# Patient Record
Sex: Male | Born: 1955 | Race: Black or African American | Hispanic: No | Marital: Single | Smoking: Current some day smoker
Health system: Southern US, Community
[De-identification: ages and names within clinical notes are randomized; demographics above are authoritative.]

## PROBLEM LIST (undated history)

## (undated) DIAGNOSIS — G709 Myoneural disorder, unspecified: Secondary | ICD-10-CM

## (undated) DIAGNOSIS — J45909 Unspecified asthma, uncomplicated: Secondary | ICD-10-CM

## (undated) DIAGNOSIS — Z973 Presence of spectacles and contact lenses: Secondary | ICD-10-CM

## (undated) DIAGNOSIS — Z8709 Personal history of other diseases of the respiratory system: Secondary | ICD-10-CM

## (undated) DIAGNOSIS — M109 Gout, unspecified: Secondary | ICD-10-CM

## (undated) DIAGNOSIS — D573 Sickle-cell trait: Secondary | ICD-10-CM

## (undated) DIAGNOSIS — I639 Cerebral infarction, unspecified: Secondary | ICD-10-CM

## (undated) DIAGNOSIS — I10 Essential (primary) hypertension: Secondary | ICD-10-CM

## (undated) DIAGNOSIS — H469 Unspecified optic neuritis: Secondary | ICD-10-CM

## (undated) DIAGNOSIS — K759 Inflammatory liver disease, unspecified: Secondary | ICD-10-CM

## (undated) DIAGNOSIS — I2510 Atherosclerotic heart disease of native coronary artery without angina pectoris: Secondary | ICD-10-CM

## (undated) DIAGNOSIS — I739 Peripheral vascular disease, unspecified: Secondary | ICD-10-CM

## (undated) DIAGNOSIS — T7840XA Allergy, unspecified, initial encounter: Secondary | ICD-10-CM

## (undated) DIAGNOSIS — H269 Unspecified cataract: Secondary | ICD-10-CM

## (undated) DIAGNOSIS — K219 Gastro-esophageal reflux disease without esophagitis: Secondary | ICD-10-CM

## (undated) DIAGNOSIS — Z972 Presence of dental prosthetic device (complete) (partial): Secondary | ICD-10-CM

## (undated) DIAGNOSIS — R569 Unspecified convulsions: Secondary | ICD-10-CM

## (undated) DIAGNOSIS — R0600 Dyspnea, unspecified: Secondary | ICD-10-CM

## (undated) DIAGNOSIS — C649 Malignant neoplasm of unspecified kidney, except renal pelvis: Secondary | ICD-10-CM

## (undated) DIAGNOSIS — F1010 Alcohol abuse, uncomplicated: Secondary | ICD-10-CM

## (undated) DIAGNOSIS — I509 Heart failure, unspecified: Secondary | ICD-10-CM

## (undated) DIAGNOSIS — Z9289 Personal history of other medical treatment: Secondary | ICD-10-CM

## (undated) DIAGNOSIS — I864 Gastric varices: Secondary | ICD-10-CM

## (undated) DIAGNOSIS — G8929 Other chronic pain: Secondary | ICD-10-CM

## (undated) DIAGNOSIS — N189 Chronic kidney disease, unspecified: Secondary | ICD-10-CM

## (undated) DIAGNOSIS — K7460 Unspecified cirrhosis of liver: Secondary | ICD-10-CM

## (undated) DIAGNOSIS — M1990 Unspecified osteoarthritis, unspecified site: Secondary | ICD-10-CM

## (undated) DIAGNOSIS — R188 Other ascites: Secondary | ICD-10-CM

## (undated) DIAGNOSIS — M549 Dorsalgia, unspecified: Secondary | ICD-10-CM

## (undated) DIAGNOSIS — F1910 Other psychoactive substance abuse, uncomplicated: Secondary | ICD-10-CM

## (undated) HISTORY — PX: OTHER SURGICAL HISTORY: SHX169

## (undated) HISTORY — DX: Unspecified optic neuritis: H46.9

## (undated) HISTORY — DX: Sickle-cell trait: D57.3

## (undated) HISTORY — DX: Essential (primary) hypertension: I10

## (undated) HISTORY — DX: Myoneural disorder, unspecified: G70.9

## (undated) HISTORY — DX: Unspecified cirrhosis of liver: K74.60

## (undated) HISTORY — DX: Allergy, unspecified, initial encounter: T78.40XA

## (undated) HISTORY — DX: Unspecified cataract: H26.9

## (undated) HISTORY — DX: Unspecified osteoarthritis, unspecified site: M19.90

## (undated) HISTORY — DX: Other psychoactive substance abuse, uncomplicated: F19.10

## (undated) HISTORY — PX: UPPER GASTROINTESTINAL ENDOSCOPY: SHX188

---

## 1996-04-29 DIAGNOSIS — I639 Cerebral infarction, unspecified: Secondary | ICD-10-CM

## 1996-04-29 HISTORY — DX: Cerebral infarction, unspecified: I63.9

## 1999-04-30 DIAGNOSIS — I509 Heart failure, unspecified: Secondary | ICD-10-CM

## 1999-04-30 HISTORY — DX: Heart failure, unspecified: I50.9

## 2012-12-16 ENCOUNTER — Emergency Department (HOSPITAL_COMMUNITY)
Admission: EM | Admit: 2012-12-16 | Discharge: 2012-12-16 | Disposition: A | Discharge status: Home / Self Care | Payer: Self-pay | Attending: Emergency Medicine | Admitting: Emergency Medicine

## 2012-12-16 ENCOUNTER — Encounter (HOSPITAL_COMMUNITY): Payer: Self-pay | Admitting: *Deleted

## 2012-12-16 DIAGNOSIS — Z880 Allergy status to penicillin: Secondary | ICD-10-CM | POA: Insufficient documentation

## 2012-12-16 DIAGNOSIS — M542 Cervicalgia: Secondary | ICD-10-CM | POA: Insufficient documentation

## 2012-12-16 DIAGNOSIS — F17200 Nicotine dependence, unspecified, uncomplicated: Secondary | ICD-10-CM | POA: Insufficient documentation

## 2012-12-16 DIAGNOSIS — M546 Pain in thoracic spine: Secondary | ICD-10-CM | POA: Insufficient documentation

## 2012-12-16 DIAGNOSIS — M549 Dorsalgia, unspecified: Secondary | ICD-10-CM

## 2012-12-16 DIAGNOSIS — M2550 Pain in unspecified joint: Secondary | ICD-10-CM | POA: Insufficient documentation

## 2012-12-16 DIAGNOSIS — I10 Essential (primary) hypertension: Secondary | ICD-10-CM | POA: Insufficient documentation

## 2012-12-16 HISTORY — DX: Essential (primary) hypertension: I10

## 2012-12-16 MED ORDER — HYDROMORPHONE HCL PF 1 MG/ML IJ SOLN
1.0000 mg | Freq: Once | INTRAMUSCULAR | Status: AC
  Administered 2012-12-16: 1 mg via INTRAMUSCULAR
  Filled 2012-12-16: qty 1

## 2012-12-16 MED ORDER — OXYCODONE-ACETAMINOPHEN 5-325 MG PO TABS: 1.0000 | ORAL_TABLET | ORAL | Status: DC | PRN

## 2012-12-16 NOTE — ED Notes (Signed)
Pt requesting to see Child psychotherapist. States is new in town and needs resources. SW paged.

## 2012-12-16 NOTE — ED Provider Notes (Signed)
CSN: [id number]     Arrival date & time 12/16/12  1221 History     First MD Initiated Contact with Patient 12/16/12 1231     Chief Complaint  Patient presents with  . Back Pain   (Consider location/radiation/quality/duration/timing/severity/associated sxs/prior Treatment) The history is provided by the patient and medical records.   Patient presents to the ED for her neck and upper back pain for the past several weeks, worse over the past 2-3 days. No recent injury, trauma, or falls.  Patient states approximately 15 years ago he fell 20 feet at a land[place] landing on a concrete slab. Patient states no vertebral fractures at that time, but he does have some residual, intermittent pain. Denies any numbness or paresthesias of extremities. No loss of bowel or bladder function.  Has been taking OTC meds without relief.  Patient recently moved here from [place], and is not currently have a PCP.  No chest pain, shortness of breath, or abdominal pain. No urinary symptoms.  Past Medical History  Diagnosis Date  . Hypertension    History reviewed. No pertinent past surgical history. History reviewed. No pertinent family history. History  Substance Use Topics  . Smoking status: Current Every Day Smoker    Types: Cigarettes  . Smokeless tobacco: Not on file  . Alcohol Use: Yes     Comment: occ    Review of Systems  Musculoskeletal: Positive for back pain and arthralgias.  All other systems reviewed and are negative.    Allergies  Penicillins  Home Medications  No current outpatient prescriptions on file. BP 155/98  Pulse 66  Temp(Src) 98.3 F (36.8 C) (Oral)  Resp 18  SpO2 96%  Physical Exam  Nursing note and vitals reviewed. Constitutional: He is oriented to person, place, and time. He appears well-developed and well-nourished. No distress.  HENT:  Head: Normocephalic and atraumatic.  Mouth/Throat: Oropharynx is clear and moist.  Eyes: Conjunctivae and EOM are normal. Pupils  are equal, round, and reactive to light.  Neck: Normal range of motion. Neck supple. No rigidity.  No meningeal signs  Cardiovascular: Normal rate, regular rhythm and normal heart sounds.   Pulmonary/Chest: Effort normal and breath sounds normal. No respiratory distress. He has no wheezes.  Musculoskeletal: He exhibits no edema.       Cervical back: He exhibits decreased range of motion, tenderness, bony tenderness and pain. He exhibits no swelling, no edema, no deformity, no laceration, no spasm and normal pulse.       Thoracic back: He exhibits decreased range of motion, tenderness, bony tenderness and pain. He exhibits no swelling, no edema, no deformity, no laceration, no spasm and normal pulse.  Diffuse TTP of CS and TS without step off, deformity, or visible signs of trauma; decreased ROM due to pain; strong pulses in all 4 extremities; distal sensation intact  Neurological: He is alert and oriented to person, place, and time.  Skin: Skin is warm and dry. He is not diaphoretic.  Psychiatric: He has a normal mood and affect.    ED Course   Procedures (including critical care time)  Labs Reviewed - No data to display No results found.  1. Back pain   2. Neck pain     MDM   Atraumatic, acute on chronic neck and back pain.  No concern for aortic dissection or cauda equina.  IM dilaudid given with good improvement.  Rx percocet.  [facility].  Discussed plan with pt, he agreed.  Return precautions advised.  Pt ambulated out of the ED without difficulty.  Garlon Hatchet, PA-C 12/16/12 1346  Garlon Hatchet, PA-C 12/16/12 1346

## 2012-12-16 NOTE — ED Notes (Signed)
Reports neck and upper back pain x 2-3 days. Hx of injury in past but denies new injury. No acute distress noted at triage.

## 2012-12-16 NOTE — ED Notes (Signed)
Child psychotherapist in.

## 2012-12-16 NOTE — ED Notes (Signed)
C/O neck and upper back pain x 2-3 days. No known injury.

## 2012-12-16 NOTE — ED Provider Notes (Signed)
Medical screening examination/treatment/procedure(s) were performed by non-physician practitioner and as supervising physician I was immediately available for consultation/collaboration.   Patrina Andreas, MD 12/16/12 1356 

## 2012-12-29 ENCOUNTER — Encounter: Payer: Self-pay | Admitting: Internal Medicine

## 2012-12-29 ENCOUNTER — Ambulatory Visit: Payer: Self-pay | Attending: Internal Medicine | Admitting: Internal Medicine

## 2012-12-29 VITALS — BP 135/88 | HR 83 | Temp 98.5°F | Resp 16 | Ht 69.29 in | Wt 243.6 lb

## 2012-12-29 DIAGNOSIS — M545 Low back pain, unspecified: Secondary | ICD-10-CM | POA: Insufficient documentation

## 2012-12-29 DIAGNOSIS — I10 Essential (primary) hypertension: Secondary | ICD-10-CM | POA: Insufficient documentation

## 2012-12-29 DIAGNOSIS — M542 Cervicalgia: Secondary | ICD-10-CM | POA: Insufficient documentation

## 2012-12-29 MED ORDER — TRAMADOL HCL 50 MG PO TABS: 50.0000 mg | ORAL_TABLET | Freq: Three times a day (TID) | ORAL | Status: DC | PRN

## 2012-12-29 MED ORDER — HYDROCHLOROTHIAZIDE 25 MG PO TABS: 25.0000 mg | ORAL_TABLET | Freq: Every day | ORAL | Status: DC

## 2012-12-29 MED ORDER — AMLODIPINE BESYLATE 10 MG PO TABS: 10.0000 mg | ORAL_TABLET | Freq: Every day | ORAL | Status: DC

## 2012-12-29 NOTE — Progress Notes (Signed)
Pt is here to establish care. Pt reports that he was moving a sofa into a house and he must have pulled his back the wrong way. Pt went to the hospital 2 weeks ago. He expressed that the pain medications he was given didn't help. Back pain started cervical now its thoracic and lumbar pain, an 8 on the pain scale

## 2012-12-29 NOTE — Progress Notes (Signed)
Patient ID: John [name], male   DOB: 01-18-1956, 57 y.o.   MRN: [id number]  CC: To establish care  HPI: John [name] is a 57 year old African American man who came to clinic today to establish medical care. He was seen in urgent care recently for low back as well as neck pain which he had since the time he attempted to lift a sofa. He has had pain in his lower back since about 10-15 years has been on medication on and off. There is no numbness or tingling sensation in the lower limbs, he is able to ambulate but it difficult getting up from a sitting position. No leg swelling, no headache, no blurry vision.   Allergies  Allergen Reactions  . Penicillins     Convulsions?   Past Medical History  Diagnosis Date  . Hypertension    Current Outpatient Prescriptions on File Prior to Visit  Medication Sig Dispense Refill  . Ascorbic Acid (VITAMIN C PO) Take 1 tablet by mouth daily.      Marland Kitchen HYDROcodone-acetaminophen (NORCO) 10-325 MG per tablet Take 1 tablet by mouth every 6 (six) hours as needed for pain.      Marland Kitchen ibuprofen (ADVIL,MOTRIN) 200 MG tablet Take 800 mg by mouth every 6 (six) hours as needed for pain.      Marland Kitchen oxyCODONE-acetaminophen (PERCOCET/ROXICET) 5-325 MG per tablet Take 1 tablet by mouth every 4 (four) hours as needed for pain.  15 tablet  0   No current facility-administered medications on file prior to visit.   No family history on file. History   Social History  . Marital Status: Single    Spouse Name: N/A    Number of Children: N/A  . Years of Education: N/A   Occupational History  . Not on file.   Social History Main Topics  . Smoking status: Current Every Day Smoker -- 0.30 packs/day    Types: Cigarettes  . Smokeless tobacco: Not on file  . Alcohol Use: Yes     Comment: occ  . Drug Use: No  . Sexual Activity: Not on file   Other Topics Concern  . Not on file   Social History Narrative  . No narrative on file    Review of Systems: Constitutional:  Negative for fever, chills, diaphoresis, activity change, appetite change and fatigue. HENT: Negative for ear pain, nosebleeds, congestion, facial swelling, rhinorrhea, neck pain, neck stiffness and ear discharge.  Eyes: Negative for pain, discharge, redness, itching and visual disturbance. Respiratory: Negative for cough, choking, chest tightness, shortness of breath, wheezing and stridor.  Cardiovascular: Negative for chest pain, palpitations and leg swelling. Gastrointestinal: Negative for abdominal distention. Genitourinary: Negative for dysuria, urgency, frequency, hematuria, flank pain, decreased urine volume, difficulty urinating and dyspareunia.  Musculoskeletal: Negative for back pain, joint swelling, arthralgias and gait problem. Neurological: Negative for dizziness, tremors, seizures, syncope, facial asymmetry, speech difficulty, weakness, light-headedness, numbness and headaches.  Hematological: Negative for adenopathy. Does not bruise/bleed easily. Psychiatric/Behavioral: Negative for hallucinations, behavioral problems, confusion, dysphoric mood, decreased concentration and agitation.    Objective:   Filed Vitals:   12/29/12 1620  BP: 135/88  Pulse: 83  Temp: 98.5 F (36.9 C)  Resp: 16    Physical Exam: Constitutional: Patient appears well-developed and well-nourished. No distress. Obese HENT: Normocephalic, atraumatic, External right and left ear normal. Oropharynx is clear and moist.  Eyes: Conjunctivae and EOM are normal. PERRLA, no scleral icterus. Neck: Normal ROM. Neck supple. No JVD. No tracheal deviation. No  thyromegaly. Some nonspecific point tenderness on the nape CVS: RRR, S1/S2 +, no murmurs, no gallops, no carotid bruit.  Pulmonary: Effort and breath sounds normal, no stridor, rhonchi, wheezes, rales.  Abdominal: Soft. BS +,  no distension, tenderness, rebound or guarding.  Musculoskeletal: Normal range of motion. No edema and no tenderness.   Lymphadenopathy: No lymphadenopathy noted, cervical, inguinal or axillary Neuro: Alert. Normal reflexes, muscle tone coordination. No cranial nerve deficit. Skin: Skin is warm and dry. No rash noted. Not diaphoretic. No erythema. No pallor. Psychiatric: Normal mood and affect. Behavior, judgment, thought content normal.  No results found for this basename: WBC, HGB, HCT, MCV, PLT   No results found for this basename: CREATININE, BUN, NA, K, CL, CO2    No results found for this basename: HGBA1C   Lipid Panel  No results found for this basename: chol, trig, hdl, cholhdl, vldl, ldlcalc       Assessment and plan:   Patient Active Problem List   Diagnosis Date Noted  . Essential hypertension, benign 12/29/2012  . Neck pain 12/29/2012  . Low back pain 12/29/2012   Tramadol 50 mg tablet by mouth 3 times a day q. when necessary pain Refill amlodipine 10 mg tablet by mouth daily Refill hydrochlorothiazide 25 mg tablet by mouth daily  Patient extensively counseled about pain X-ray lumbar spine to rule out slipped disc  Extensive counseling about smoking cessation done Nutrition and exercise counseling done  John [name] was given clear instructions to go to ER or return to the clinic if symptoms don't improve, worsen or new problems develop.  John [name] verbalized understanding.  John [name] was told to call to get lab results if hasn't heard anything in the next week.        Jeanann Lewandowsky, MD [facility] [place], [place] [phone]   12/29/2012, 5:02 PM

## 2013-01-04 ENCOUNTER — Telehealth: Payer: Self-pay | Admitting: Internal Medicine

## 2013-01-04 ENCOUNTER — Ambulatory Visit: Payer: Self-pay | Attending: Internal Medicine

## 2013-01-04 ENCOUNTER — Ambulatory Visit (HOSPITAL_COMMUNITY)
Admission: RE | Admit: 2013-01-04 | Discharge: 2013-01-04 | Disposition: A | Discharge status: Home / Self Care | Payer: Self-pay | Source: Ambulatory Visit | Attending: Internal Medicine | Admitting: Internal Medicine

## 2013-01-04 DIAGNOSIS — R262 Difficulty in walking, not elsewhere classified: Secondary | ICD-10-CM | POA: Insufficient documentation

## 2013-01-04 DIAGNOSIS — M5137 Other intervertebral disc degeneration, lumbosacral region: Secondary | ICD-10-CM | POA: Insufficient documentation

## 2013-01-04 DIAGNOSIS — G894 Chronic pain syndrome: Secondary | ICD-10-CM

## 2013-01-04 DIAGNOSIS — M545 Low back pain, unspecified: Secondary | ICD-10-CM | POA: Insufficient documentation

## 2013-01-04 DIAGNOSIS — M51379 Other intervertebral disc degeneration, lumbosacral region without mention of lumbar back pain or lower extremity pain: Secondary | ICD-10-CM | POA: Insufficient documentation

## 2013-01-04 NOTE — Telephone Encounter (Signed)
Patient recently had an x-ray done on his back. He would like to know the results and to establish the next step in his care. ~CC

## 2013-01-04 NOTE — Telephone Encounter (Signed)
Patient would like a referral to the pain clinic

## 2013-01-07 ENCOUNTER — Emergency Department (HOSPITAL_COMMUNITY)
Admission: EM | Admit: 2013-01-07 | Discharge: 2013-01-07 | Disposition: A | Discharge status: Home / Self Care | Payer: Self-pay | Attending: Emergency Medicine | Admitting: Emergency Medicine

## 2013-01-07 ENCOUNTER — Encounter (HOSPITAL_COMMUNITY): Payer: Self-pay | Admitting: Emergency Medicine

## 2013-01-07 DIAGNOSIS — M542 Cervicalgia: Secondary | ICD-10-CM

## 2013-01-07 DIAGNOSIS — Y929 Unspecified place or not applicable: Secondary | ICD-10-CM | POA: Insufficient documentation

## 2013-01-07 DIAGNOSIS — S0993XA Unspecified injury of face, initial encounter: Secondary | ICD-10-CM | POA: Insufficient documentation

## 2013-01-07 DIAGNOSIS — Z79899 Other long term (current) drug therapy: Secondary | ICD-10-CM | POA: Insufficient documentation

## 2013-01-07 DIAGNOSIS — Z880 Allergy status to penicillin: Secondary | ICD-10-CM | POA: Insufficient documentation

## 2013-01-07 DIAGNOSIS — I10 Essential (primary) hypertension: Secondary | ICD-10-CM | POA: Insufficient documentation

## 2013-01-07 DIAGNOSIS — X500XXA Overexertion from strenuous movement or load, initial encounter: Secondary | ICD-10-CM | POA: Insufficient documentation

## 2013-01-07 DIAGNOSIS — Y9389 Activity, other specified: Secondary | ICD-10-CM | POA: Insufficient documentation

## 2013-01-07 DIAGNOSIS — IMO0002 Reserved for concepts with insufficient information to code with codable children: Secondary | ICD-10-CM | POA: Insufficient documentation

## 2013-01-07 DIAGNOSIS — F17200 Nicotine dependence, unspecified, uncomplicated: Secondary | ICD-10-CM | POA: Insufficient documentation

## 2013-01-07 DIAGNOSIS — M549 Dorsalgia, unspecified: Secondary | ICD-10-CM

## 2013-01-07 MED ORDER — METHOCARBAMOL 500 MG PO TABS: 500.0000 mg | ORAL_TABLET | Freq: Two times a day (BID) | ORAL | Status: DC

## 2013-01-07 MED ORDER — OXYCODONE-ACETAMINOPHEN 5-325 MG PO TABS
2.0000 | ORAL_TABLET | Freq: Once | ORAL | Status: AC
  Administered 2013-01-07: 2 via ORAL
  Filled 2013-01-07: qty 2

## 2013-01-07 MED ORDER — OXYCODONE-ACETAMINOPHEN 5-325 MG PO TABS: 1.0000 | ORAL_TABLET | Freq: Four times a day (QID) | ORAL | Status: DC | PRN

## 2013-01-07 MED ORDER — METHOCARBAMOL 500 MG PO TABS
500.0000 mg | ORAL_TABLET | Freq: Once | ORAL | Status: AC
  Administered 2013-01-07: 500 mg via ORAL
  Filled 2013-01-07: qty 1

## 2013-01-07 NOTE — ED Provider Notes (Signed)
CSN: [id number]     Arrival date & time 01/07/13  1532 History  This chart was scribed for non-physician practitioner Georgeanna Harrison, working with Glynn Octave, MD by Dorothey Baseman, ED Scribe. This patient was seen in room TR10C/TR10C and the patient's care was started at 6:13 PM.    Chief Complaint  Patient presents with  . Back Pain  . Neck Pain   The history is provided by the patient. No language interpreter was used.   HPI Comments: John [name] is a 57 y.o. male who presents to the Emergency Department complaining of back and neck pain described as a stiffness over the past 3-4 weeks after lifting a couch. He was seen in the ED on 12/16/2012 for similar complaints and was given a prescription for Percocet with relief, but that states that the pain has returned. He states he has taken Tylenol at home without relief. He reports feeling some numbness in his right leg. Patient denies any fever, bowel or bladder incontinence, or any other symptoms at this time.  Past Medical History  Diagnosis Date  . Hypertension    History reviewed. No pertinent past surgical history. History reviewed. No pertinent family history. History  Substance Use Topics  . Smoking status: Current Every Day Smoker -- 0.30 packs/day    Types: Cigarettes  . Smokeless tobacco: Not on file  . Alcohol Use: Yes     Comment: occ    Review of Systems  A complete 10 system review of systems was obtained and all systems are negative except as noted in the HPI and PMH.   Allergies  Penicillins  Home Medications   Current Outpatient Rx  Name  Route  Sig  Dispense  Refill  . acetaminophen (TYLENOL) 500 MG tablet   Oral   Take 500-1,000 mg by mouth every 6 (six) hours as needed for pain.         . Ascorbic Acid (VITAMIN C PO)   Oral   Take 1 tablet by mouth daily.         . hydrochlorothiazide (HYDRODIURIL) 25 MG tablet   Oral   Take 25 mg by mouth daily.         . naproxen sodium  (ANAPROX) 220 MG tablet   Oral   Take 440 mg by mouth daily as needed (pain).          Triage Vitals: BP 140/101  Pulse 84  Temp(Src) 98.7 F (37.1 C) (Oral)  Resp 16  SpO2 98%  Physical Exam  Nursing note and vitals reviewed. Constitutional: He is oriented to person, place, and time. He appears well-developed and well-nourished. No distress.  HENT:  Head: Normocephalic and atraumatic.  Eyes: Conjunctivae are normal.  Neck: Normal range of motion. Neck supple.  No tenderness to palpation to the C spine. Mild tenderness to palpation to the cervical paraspinal area.   Cardiovascular: Normal rate, regular rhythm and normal heart sounds.   Pulmonary/Chest: Effort normal and breath sounds normal. No respiratory distress.  Musculoskeletal: Normal range of motion.  Tenderness to palpation to the T and L spine. Gait is slowed in step, but otherwise normal.   Neurological: He is alert and oriented to person, place, and time. He has normal reflexes.  Reflex Scores:      Patellar reflexes are 2+ on the right side and 2+ on the left side.      Achilles reflexes are 2+ on the right side and 2+ on the left side.  Distal sensation of lower extremities intact. Normal strength of lower extremities. No ataxia.   Skin: Skin is warm and dry.  Psychiatric: He has a normal mood and affect. His behavior is normal.    ED Course  Procedures (including critical care time)  Medications  methocarbamol (ROBAXIN) tablet 500 mg (500 mg Oral Given 01/07/13 1824)  oxyCODONE-acetaminophen (PERCOCET/ROXICET) 5-325 MG per tablet 2 tablet (2 tablets Oral Given 01/07/13 1824)   DIAGNOSTIC STUDIES: Oxygen Saturation is 98% on room air, normal by my interpretation.    COORDINATION OF CARE: 6:18PM- Will order and discharge patient with Robaxin and Percocet. Advised patient to follow up with the referred orthopaedist if there are any new or worsening symptoms. Discussed treatment plan with patient at bedside and  patient verbalized agreement.    Labs Review Labs Reviewed - No data to display Imaging Review No results found.  MDM  No diagnosis found. Patient with back pain.  No neurological deficits and normal neuro exam.  Patient can walk but states is painful.  No loss of bowel or bladder control.  No concern for cauda equina.  No fever, night sweats, weight loss, h/o cancer, IVDU.  RICE protocol and pain medicine indicated and discussed with patient.  I personally performed the services described in this documentation, which was scribed in my presence. The recorded information has been reviewed and is accurate.    Pascal Lux [place], PA-C 01/13/13 [phone]

## 2013-01-07 NOTE — ED Notes (Signed)
Pt c/o generalized back and neck pain x 2 weeks since injured back

## 2013-01-07 NOTE — ED Notes (Signed)
States has had pain from neck to low back x 2 weeks.

## 2013-01-08 ENCOUNTER — Telehealth: Payer: Self-pay

## 2013-01-08 NOTE — Telephone Encounter (Signed)
X ray results given- patient is aware

## 2013-01-08 NOTE — Telephone Encounter (Signed)
Message copied by Lestine Mount on Fri Jan 08, 2013  4:00 PM ------      Message from: Jeanann Lewandowsky E      Created: Thu Jan 07, 2013  9:37 AM       Please call to inform patient that the x-ray of the lumbar spine shows mild degenerative disc disease with no acute abnormality ------

## 2013-01-13 NOTE — ED Provider Notes (Signed)
Medical screening examination/treatment/procedure(s) were performed by non-physician practitioner and as supervising physician I was immediately available for consultation/collaboration.   Faren Florence, MD 01/13/13 1151 

## 2013-01-18 ENCOUNTER — Encounter: Payer: Self-pay | Admitting: Physical Medicine & Rehabilitation

## 2013-02-17 ENCOUNTER — Emergency Department (HOSPITAL_COMMUNITY)
Admission: EM | Admit: 2013-02-17 | Discharge: 2013-02-17 | Disposition: A | Discharge status: Home / Self Care | Payer: Self-pay | Attending: Emergency Medicine | Admitting: Emergency Medicine

## 2013-02-17 ENCOUNTER — Encounter (HOSPITAL_COMMUNITY): Payer: Self-pay | Admitting: Emergency Medicine

## 2013-02-17 DIAGNOSIS — I10 Essential (primary) hypertension: Secondary | ICD-10-CM | POA: Insufficient documentation

## 2013-02-17 DIAGNOSIS — R209 Unspecified disturbances of skin sensation: Secondary | ICD-10-CM | POA: Insufficient documentation

## 2013-02-17 DIAGNOSIS — M545 Low back pain, unspecified: Secondary | ICD-10-CM | POA: Insufficient documentation

## 2013-02-17 DIAGNOSIS — M549 Dorsalgia, unspecified: Secondary | ICD-10-CM

## 2013-02-17 DIAGNOSIS — Z880 Allergy status to penicillin: Secondary | ICD-10-CM | POA: Insufficient documentation

## 2013-02-17 DIAGNOSIS — F17200 Nicotine dependence, unspecified, uncomplicated: Secondary | ICD-10-CM | POA: Insufficient documentation

## 2013-02-17 MED ORDER — PROMETHAZINE HCL 25 MG PO TABS: 25.0000 mg | ORAL_TABLET | Freq: Four times a day (QID) | ORAL | Status: DC | PRN

## 2013-02-17 MED ORDER — OXYCODONE-ACETAMINOPHEN 5-325 MG PO TABS: 2.0000 | ORAL_TABLET | Freq: Four times a day (QID) | ORAL | Status: DC | PRN

## 2013-02-17 MED ORDER — ONDANSETRON 4 MG PO TBDP
8.0000 mg | ORAL_TABLET | Freq: Once | ORAL | Status: AC
  Administered 2013-02-17: 8 mg via ORAL
  Filled 2013-02-17: qty 2

## 2013-02-17 MED ORDER — OXYCODONE-ACETAMINOPHEN 5-325 MG PO TABS
2.0000 | ORAL_TABLET | Freq: Once | ORAL | Status: AC
  Administered 2013-02-17: 2 via ORAL
  Filled 2013-02-17: qty 2

## 2013-02-17 MED ORDER — PREDNISONE 20 MG PO TABS: ORAL_TABLET | ORAL | Status: DC

## 2013-02-17 NOTE — ED Notes (Signed)
Pt reports lower back pain onset 3 weeks ago, radiating into BLE and intermittent "numb sensation" in his legs. Denies bowel/bladder changes. ambulatory with pain. Took a roommates percocet with pain reilef. Hx chronic back pain.

## 2013-02-17 NOTE — ED Provider Notes (Signed)
CSN: [id number]     Arrival date & time 02/17/13  1255 History  This chart was scribed for non-physician practitioner Junious Silk, PA-C working with Laray Anger, DO by Danella Maiers, ED Scribe. This patient was seen in room TR06C/TR06C and the patient's care was started at 1:05 PM.   Chief Complaint  Patient presents with  . Back Pain   The history is provided by the patient. No language interpreter was used.   HPI Comments: John [name] is a 57 y.o. male who presents to the Emergency Department complaining of burning, sharp lower back pain that radiates to bilateral lower extremities onset three weeks ago. The right leg has been constantly numb for the past two to three weeks and left leg has been intermittently numb.  He denies any injuries or falls. He took a Scientist, product/process development with relief. Certain positions make his pain worse. He denies urinary or bowel incontinence. He denies drug use. No history of cancer.   Past Medical History  Diagnosis Date  . Hypertension    History reviewed. No pertinent past surgical history. History reviewed. No pertinent family history. History  Substance Use Topics  . Smoking status: Current Every Day Smoker -- 0.30 packs/day    Types: Cigarettes  . Smokeless tobacco: Not on file  . Alcohol Use: Yes     Comment: occ    Review of Systems  Constitutional: Negative for fever and chills.  Gastrointestinal: Negative for nausea and vomiting.  Genitourinary: Negative for dysuria.  Musculoskeletal: Positive for back pain.  Neurological: Positive for numbness.  All other systems reviewed and are negative.    Allergies  Penicillins  Home Medications   Current Outpatient Rx  Name  Route  Sig  Dispense  Refill  . oxyCODONE-acetaminophen (PERCOCET/ROXICET) 5-325 MG per tablet   Oral   Take 1 tablet by mouth 2 (two) times daily as needed for pain.           BP 169/104  Pulse 86  Temp(Src) 98.5 F (36.9 C) (Oral)  Resp 22  Ht  5\' 10"  (1.778 m)  Wt 245 lb (111.131 kg)  BMI 35.15 kg/m2  SpO2 98% Physical Exam  Nursing note and vitals reviewed. Constitutional: He is oriented to person, place, and time. He appears well-developed and well-nourished. No distress.  HENT:  Head: Normocephalic and atraumatic.  Right Ear: External ear normal.  Left Ear: External ear normal.  Nose: Nose normal.  Eyes: Conjunctivae are normal.  Neck: Normal range of motion. No tracheal deviation present.  Cardiovascular: Normal rate, regular rhythm and normal heart sounds.   Pulmonary/Chest: Effort normal and breath sounds normal. No stridor.  Abdominal: Soft. He exhibits no distension. There is no tenderness.  Musculoskeletal: Normal range of motion.       Back:  Tender to palpation over low back diffusely. Strong distal pulses. Sensation intact, able to diff between sharp and dull bilaterally, antalgic gait.   Neurological: He is alert and oriented to person, place, and time.  Skin: Skin is warm and dry. He is not diaphoretic.  Psychiatric: He has a normal mood and affect. His behavior is normal.    ED Course  Procedures (including critical care time) Medications  oxyCODONE-acetaminophen (PERCOCET/ROXICET) 5-325 MG per tablet 2 tablet (2 tablets Oral Given 02/17/13 1350)  ondansetron (ZOFRAN-ODT) disintegrating tablet 8 mg (8 mg Oral Given 02/17/13 1351)    DIAGNOSTIC STUDIES: Oxygen Saturation is 98% on RA, normal by my interpretation.    COORDINATION OF CARE:  1:31 PM- Discussed treatment plan with pt which includes pain medication, steroids, and f/u with PCP. Pt agrees to plan.    Labs Review Labs Reviewed - No data to display Imaging Review No results found.  EKG Interpretation   None       MDM   1. Back pain    Patient with back pain.  No neurological deficits and normal neuro exam.  Patient can walk but states is painful.  No loss of bowel or bladder control.  No concern for cauda equina.  No fever, night  sweats, weight loss, h/o cancer, IVDU.  RICE protocol and pain medicine indicated and discussed with patient.     I personally performed the services described in this documentation, which was scribed in my presence. The recorded information has been reviewed and is accurate.    Mora Bellman, PA-C 02/17/13 [phone]

## 2013-02-18 NOTE — ED Provider Notes (Signed)
Medical screening examination/treatment/procedure(s) were performed by non-physician practitioner and as supervising physician I was immediately available for consultation/collaboration.  EKG Interpretation   None         Laray Anger, DO 02/18/13 1510

## 2013-02-19 ENCOUNTER — Ambulatory Visit: Payer: Self-pay | Admitting: Physical Medicine & Rehabilitation

## 2013-03-01 ENCOUNTER — Ambulatory Visit: Payer: Self-pay

## 2013-03-03 ENCOUNTER — Ambulatory Visit: Payer: Medicaid Other | Attending: Internal Medicine | Admitting: Internal Medicine

## 2013-03-03 VITALS — BP 173/122 | HR 79 | Temp 98.7°F | Resp 14 | Ht 68.0 in | Wt 250.0 lb

## 2013-03-03 DIAGNOSIS — M549 Dorsalgia, unspecified: Secondary | ICD-10-CM | POA: Insufficient documentation

## 2013-03-03 DIAGNOSIS — I10 Essential (primary) hypertension: Secondary | ICD-10-CM

## 2013-03-03 DIAGNOSIS — M542 Cervicalgia: Secondary | ICD-10-CM | POA: Insufficient documentation

## 2013-03-03 MED ORDER — METHOCARBAMOL 500 MG PO TABS: 1000.0000 mg | ORAL_TABLET | Freq: Four times a day (QID) | ORAL | Status: DC

## 2013-03-03 MED ORDER — LISINOPRIL-HYDROCHLOROTHIAZIDE 20-25 MG PO TABS: 1.0000 | ORAL_TABLET | Freq: Every day | ORAL | Status: DC

## 2013-03-03 NOTE — Progress Notes (Signed)
Pt is here for a f/u visit. For a month pt is having severe back and neck pain that is getting worse. Pain is now radiating down his hips and legs.

## 2013-03-03 NOTE — Patient Instructions (Addendum)
Icy Hot patches or cream  Motrin 600mg  every 6 hrs for 1wk  If Motrin causes an upset stomach, start Pepcid 20 mg twice a day.   BP check in 1 wk.

## 2013-03-16 ENCOUNTER — Ambulatory Visit: Payer: Medicaid Other | Attending: Internal Medicine | Admitting: Physical Therapy

## 2013-04-05 ENCOUNTER — Ambulatory Visit: Payer: Self-pay | Admitting: Internal Medicine

## 2013-04-12 ENCOUNTER — Encounter (HOSPITAL_COMMUNITY): Payer: Self-pay | Admitting: Emergency Medicine

## 2013-04-12 ENCOUNTER — Emergency Department (HOSPITAL_COMMUNITY)
Admission: EM | Admit: 2013-04-12 | Discharge: 2013-04-12 | Disposition: A | Discharge status: Home / Self Care | Payer: Medicaid Other | Attending: Emergency Medicine | Admitting: Emergency Medicine

## 2013-04-12 DIAGNOSIS — I10 Essential (primary) hypertension: Secondary | ICD-10-CM | POA: Insufficient documentation

## 2013-04-12 DIAGNOSIS — M5416 Radiculopathy, lumbar region: Secondary | ICD-10-CM

## 2013-04-12 DIAGNOSIS — M7022 Olecranon bursitis, left elbow: Secondary | ICD-10-CM

## 2013-04-12 DIAGNOSIS — IMO0002 Reserved for concepts with insufficient information to code with codable children: Secondary | ICD-10-CM | POA: Insufficient documentation

## 2013-04-12 DIAGNOSIS — F17200 Nicotine dependence, unspecified, uncomplicated: Secondary | ICD-10-CM | POA: Insufficient documentation

## 2013-04-12 DIAGNOSIS — M7020 Olecranon bursitis, unspecified elbow: Secondary | ICD-10-CM | POA: Insufficient documentation

## 2013-04-12 DIAGNOSIS — Z880 Allergy status to penicillin: Secondary | ICD-10-CM | POA: Insufficient documentation

## 2013-04-12 DIAGNOSIS — Z79899 Other long term (current) drug therapy: Secondary | ICD-10-CM | POA: Insufficient documentation

## 2013-04-12 MED ORDER — METHOCARBAMOL 500 MG PO TABS: 500.0000 mg | ORAL_TABLET | Freq: Four times a day (QID) | ORAL | Status: DC

## 2013-04-12 MED ORDER — OXYCODONE-ACETAMINOPHEN 5-325 MG PO TABS
2.0000 | ORAL_TABLET | Freq: Once | ORAL | Status: AC
  Administered 2013-04-12: 2 via ORAL
  Filled 2013-04-12: qty 2

## 2013-04-12 MED ORDER — IBUPROFEN 600 MG PO TABS: 600.0000 mg | ORAL_TABLET | Freq: Four times a day (QID) | ORAL | Status: DC | PRN

## 2013-04-12 MED ORDER — OXYCODONE-ACETAMINOPHEN 5-325 MG PO TABS: 1.0000 | ORAL_TABLET | Freq: Four times a day (QID) | ORAL | Status: DC | PRN

## 2013-04-12 NOTE — ED Provider Notes (Signed)
CSN: [id number]     Arrival date & time 04/12/13  4098 History   First MD Initiated Contact with Patient 04/12/13 [phone]     Chief Complaint  Patient presents with  . Back Pain  . Elbow Pain   (Consider location/radiation/quality/duration/timing/severity/associated sxs/prior Treatment) Patient is a 57 y.o. male presenting with back pain and extremity pain. The history is provided by the patient.  Back Pain Location:  Lumbar spine Quality:  Aching and stiffness Stiffness is present:  All day Radiates to:  L posterior upper leg Pain severity:  Moderate Pain is:  Same all the time Onset quality:  Gradual Duration:  6 months Timing:  Intermittent Progression:  Worsening Chronicity:  Recurrent Context: lifting heavy objects   Relieved by:  None tried Worsened by:  Bending and lying down Ineffective treatments:  Lying down and OTC medications Associated symptoms: leg pain   Associated symptoms: no abdominal pain, no abdominal swelling, no bladder incontinence, no bowel incontinence, no chest pain, no dysuria, no fever, no numbness, no paresthesias, no pelvic pain, no perianal numbness and no tingling   Risk factors: lack of exercise   Extremity Pain This is a new (left elbow) problem. Episode onset: 2 weeks ago. The problem occurs daily. The problem has been gradually improving. Associated symptoms include joint swelling. Pertinent negatives include no abdominal pain, chest pain, coughing, fever, nausea, numbness, rash ( no color change) or sore throat. Exacerbated by: extension of the elbow. He has tried rest for the symptoms. The treatment provided mild relief.    Past Medical History  Diagnosis Date  . Hypertension    History reviewed. No pertinent past surgical history. No family history on file. History  Substance Use Topics  . Smoking status: Current Every Day Smoker -- 0.30 packs/day    Types: Cigarettes  . Smokeless tobacco: Not on file  . Alcohol Use: Yes     Comment:  occ    Review of Systems  Constitutional: Negative for fever.  HENT: Negative for rhinorrhea and sore throat.   Eyes: Negative for pain.  Respiratory: Negative for cough.   Cardiovascular: Negative for chest pain and leg swelling.  Gastrointestinal: Negative for nausea, abdominal pain and bowel incontinence.  Genitourinary: Negative for bladder incontinence, dysuria and pelvic pain.  Musculoskeletal: Positive for back pain and joint swelling.  Skin: Negative for color change and rash ( no color change).  Neurological: Negative for tingling, numbness and paresthesias.  Psychiatric/Behavioral: Negative for agitation.    Allergies  Penicillins  Home Medications   Current Outpatient Rx  Name  Route  Sig  Dispense  Refill  . lisinopril-hydrochlorothiazide (PRINZIDE,ZESTORETIC) 20-25 MG per tablet   Oral   Take 1 tablet by mouth daily.   90 tablet   3   . PRESCRIPTION MEDICATION   Oral   Take 1 tablet by mouth daily. "To keep me from sleeping all the time"         . ibuprofen (ADVIL,MOTRIN) 600 MG tablet   Oral   Take 1 tablet (600 mg total) by mouth every 6 (six) hours as needed.   30 tablet   0   . methocarbamol (ROBAXIN) 500 MG tablet   Oral   Take 1 tablet (500 mg total) by mouth 4 (four) times daily.   20 tablet   0   . oxyCODONE-acetaminophen (ROXICET) 5-325 MG per tablet   Oral   Take 1 tablet by mouth every 6 (six) hours as needed for moderate pain or severe  pain.   20 tablet   0    BP 122/72  Pulse 77  Temp(Src) 97.4 F (36.3 C) (Oral)  Resp 15  Ht 5\' 11"  (1.803 m)  Wt 244 lb (110.678 kg)  BMI 34.05 kg/m2  SpO2 96% Physical Exam  Nursing note and vitals reviewed. Constitutional: He is oriented to person, place, and time. He appears well-developed and well-nourished.  HENT:  Head: Normocephalic and atraumatic.  Eyes: EOM are normal.  Neck: Normal range of motion.  Cardiovascular: Normal rate, regular rhythm, normal heart sounds and intact  distal pulses.   Pulmonary/Chest: Effort normal and breath sounds normal.  Abdominal: Soft. There is no tenderness.  Musculoskeletal:       Left elbow: He exhibits decreased range of motion (intact PROM, slight pain with full extension) and swelling ( mild olecrenon area, no erythema, no warmth). He exhibits no deformity and no laceration. Tenderness found. Olecranon process tenderness noted.       Lumbar back: He exhibits decreased range of motion, tenderness, pain and spasm. He exhibits no bony tenderness, no swelling, no edema, no deformity, no laceration and normal pulse.       Back:  Neurological: He is alert and oriented to person, place, and time. He has normal strength and normal reflexes. No sensory deficit. Gait normal.  Skin: Skin is warm and dry. No erythema.  Psychiatric: He has a normal mood and affect.    ED Course  Procedures (including critical care time) Labs Review Labs Reviewed - No data to display Imaging Review No results found.  EKG Interpretation   None       MDM   1. Lumbar radiculopathy   2. Olecranon bursitis of left elbow    57 yo male with recurrent back pain and new left elbow pain and swelling. Neuro exam reveals no deficits. Has had recent lumbar imaging, no evidence of bony pathology. No evidence to suggest cauda equina, no emergent imaging indicated today. Suspect lumbar radiculopathy. Will treat with pain meds, NSAIDs and muscle relaxants and recommend f/u with a PCP for referral for physical therapy and additional evaluation as indicated. In terms of the elbow, patient states the swelling has improved over the past several days. Has intact ROM and no erythema, warmth nor hx of fever, no evidence to suggest septic joint. Clinical picture c/w olecrenon bursitis. Recommend RICE and f/u with PCP. Infection warnings and return precautions discussed and provided to the patient at time of d/c.    Simmie Davies, NP 04/12/13 [phone]

## 2013-04-12 NOTE — ED Notes (Signed)
Pt c/o back pain, has had intermittent back pain for 6 months after helping friend move couch. Pt also c/o L elbow pain, states he this has been hurting for two weeks. Ax4, NAD.

## 2013-04-12 NOTE — ED Provider Notes (Signed)
Medical screening examination/treatment/procedure(s) were performed by non-physician practitioner and as supervising physician I was immediately available for consultation/collaboration.  EKG Interpretation   None         Bereket Gernert M Rondell Frick, DO 04/12/13 0814 

## 2013-04-15 ENCOUNTER — Ambulatory Visit: Payer: Medicaid Other | Attending: Internal Medicine

## 2013-04-15 DIAGNOSIS — M545 Low back pain, unspecified: Secondary | ICD-10-CM

## 2013-04-15 MED ORDER — DULOXETINE HCL 30 MG PO CPEP: 30.0000 mg | ORAL_CAPSULE | Freq: Every day | ORAL | Status: DC

## 2013-04-15 NOTE — Progress Notes (Unsigned)
Pt here with c/o chronic lower back pain intermit x 6 mnths radiating more to left leg unrelieved by prescribed medication MRI 9/2 showed degenerative disc changes Requesting pain meds

## 2013-04-15 NOTE — Patient Instructions (Addendum)
Call in 1 week to let us know how your back pain is doing and how you are doing on the cymbalta. If all is well, I will then call you in the higher dose if needed.   Back Exercises Back exercises help treat and prevent back injuries. The goal of back exercises is to increase the strength of your abdominal and back muscles and the flexibility of your back. These exercises should be started when you no longer have back pain. Back exercises include:  Pelvic Tilt. Lie on your back with your knees bent. Tilt your pelvis until the lower part of your back is against the floor. Hold this position 5 to 10 sec and repeat 5 to 10 times.  Knee to Chest. Pull first 1 knee up against your chest and hold for 20 to 30 seconds, repeat this with the other knee, and then both knees. This may be done with the other leg straight or bent, whichever feels better.  Sit-Ups or Curl-Ups. Bend your knees 90 degrees. Start with tilting your pelvis, and do a partial, slow sit-up, lifting your trunk only 30 to 45 degrees off the floor. Take at least 2 to 3 seconds for each sit-up. Do not do sit-ups with your knees out straight. If partial sit-ups are difficult, simply do the above but with only tightening your abdominal muscles and holding it as directed.  Hip-Lift. Lie on your back with your knees flexed 90 degrees. Push down with your feet and shoulders as you raise your hips a couple inches off the floor; hold for 10 seconds, repeat 5 to 10 times.  Back arches. Lie on your stomach, propping yourself up on bent elbows. Slowly press on your hands, causing an arch in your low back. Repeat 3 to 5 times. Any initial stiffness and discomfort should lessen with repetition over time.  Shoulder-Lifts. Lie face down with arms beside your body. Keep hips and torso pressed to floor as you slowly lift your head and shoulders off the floor. Do not overdo your exercises, especially in the beginning. Exercises may cause you some mild back  discomfort which lasts for a few minutes; however, if the pain is more severe, or lasts for more than 15 minutes, do not continue exercises until you see your caregiver. Improvement with exercise therapy for back problems is slow.  See your caregivers for assistance with developing a proper back exercise program. Document Released: 05/23/2004 Document Revised: 07/08/2011 Document Reviewed: 02/14/2011 [facility] Patient Information 2014 [place], [place].

## 2013-04-15 NOTE — Progress Notes (Unsigned)
   Subjective:    Patient ID: John [name], male    DOB: 1956/02/28, 57 y.o.   MRN: [id number]  HPI Patient is here today following up on his back pain. It is lumbar in nature. He had a for a few lumbar spine x-ray in September. It showed degenerative disc disease at L3-L4 and L4-L5 no acute abnormalities on that x-ray. Since then the patient has had continued low back discomfort without midline pain. He denies loss of bowel or or bladder function. He denies several paresthesias. He does complain of occasional leg pain especially in the left leg on that side. He does walk approximately 5 blocks 3-4 times per week. This helps the back pain somewhat. He has not done any physical therapy or back exercises. He requests pain medication today.   Review of Systems 12 point review of systems is negative except for as per history of present illness    Objective:   Physical Exam Nursing note and vitals reviewed. Constitutional: He is oriented to person, place, and time. He  appears well-developed and well-nourished.  Cardiovascular: Normal rate, regular rhythm and normal heart sounds.   Pulmonary/Chest: Effort normal and breath sounds normal.  Abdominal: Soft. Bowel sounds are normal.  no distension. There is no tenderness. There is no rebound.  Lymphadenopathy:    He has no cervical adenopathy.  Neurological: He is alert and oriented to person, place, and time. He has normal reflexes.  Skin: Skin is warm and dry.He has no concerning moles or skin lesions Psychiatric: He has a normal mood and affect. His behavior is normal. MSK - back - no ttp midline or laterally. Moderate limit in ROM of his back.        Assessment & Plan:  Tyr was seen today for follow-up and back pain.  Diagnoses and associated orders for this visit:  Low back pain - DULoxetine (CYMBALTA) 30 MG capsule; Take 1 capsule (30 mg total) by mouth daily.  Will try the Cymbalta for his back pain. I have asked him to  check in with me in one week. At that time we can discuss whether to keep him on 30 mg per day for increased to 60 mg per day I have emphasized to him that we do not prescribe narcotics in this office. On the x-ray although no acute abnormalities were noted there was a mention of disc space narrowing. If his back pain continues to be a problem especially if his legs are affected we may consider an MRI in the future. At this point I think we should pursue conservative therapy including exercises for strength and range of motion and no flu Cymbalta believes some of his pain. I will see this patient back in about 2 months.

## 2013-04-20 ENCOUNTER — Encounter (HOSPITAL_COMMUNITY): Payer: Self-pay | Admitting: Emergency Medicine

## 2013-04-20 ENCOUNTER — Emergency Department (HOSPITAL_COMMUNITY)
Admission: EM | Admit: 2013-04-20 | Discharge: 2013-04-20 | Disposition: A | Discharge status: Home / Self Care | Payer: Medicaid Other | Attending: Emergency Medicine | Admitting: Emergency Medicine

## 2013-04-20 DIAGNOSIS — R059 Cough, unspecified: Secondary | ICD-10-CM | POA: Insufficient documentation

## 2013-04-20 DIAGNOSIS — I10 Essential (primary) hypertension: Secondary | ICD-10-CM | POA: Insufficient documentation

## 2013-04-20 DIAGNOSIS — M79609 Pain in unspecified limb: Secondary | ICD-10-CM | POA: Insufficient documentation

## 2013-04-20 DIAGNOSIS — Z880 Allergy status to penicillin: Secondary | ICD-10-CM | POA: Insufficient documentation

## 2013-04-20 DIAGNOSIS — F17200 Nicotine dependence, unspecified, uncomplicated: Secondary | ICD-10-CM | POA: Insufficient documentation

## 2013-04-20 DIAGNOSIS — Z79899 Other long term (current) drug therapy: Secondary | ICD-10-CM | POA: Insufficient documentation

## 2013-04-20 DIAGNOSIS — R05 Cough: Secondary | ICD-10-CM | POA: Insufficient documentation

## 2013-04-20 DIAGNOSIS — M549 Dorsalgia, unspecified: Secondary | ICD-10-CM | POA: Insufficient documentation

## 2013-04-20 MED ORDER — METHOCARBAMOL 500 MG PO TABS: 500.0000 mg | ORAL_TABLET | Freq: Two times a day (BID) | ORAL | Status: DC | PRN

## 2013-04-20 MED ORDER — MELOXICAM 7.5 MG PO TABS: 7.5000 mg | ORAL_TABLET | Freq: Every day | ORAL | Status: DC

## 2013-04-20 NOTE — ED Notes (Signed)
Pt is here with entire back that radiates down into legs and states he twisted his back in August.  Pt denies incontinence.  Pt reports cough with green phlegm.

## 2013-04-20 NOTE — ED Provider Notes (Signed)
CSN: [id number]     Arrival date & time 04/20/13  1013 History  This chart was scribed for Sharilyn Sites, PA, working with Audree Camel, MD, by [facility] ED Scribe. This patient was seen in room TR10C/TR10C and the patient's care was started at 11:02 AM.    Chief Complaint  Patient presents with  . Back Pain    The history is provided by the patient. No language interpreter was used.   HPI Comments: John [name] is a 57 y.o. male with a history of HTN who presents to the Emergency Department complaining of persistent, intermittent upper and lower back pain over the past 4 months. He reports having a twisting injury of his back about 4 months ago, and states that he has been having intermittent back pain since then. No new injury, trauma, or falls.  He states that his pain radiates to his bilateral legs, but denies numbness/weakness/paresthesias in his legs. He also states that he has had an intermittent cough, productive of green mucous over the past 2-3 weeks. He denies any known sick contacts. He denies fever, bowel or bladder incontinence or any other symptoms. He is a current every day smoker. PCP- Dr. Keane Scrape   Past Medical History  Diagnosis Date  . Hypertension    History reviewed. No pertinent past surgical history. No family history on file. History  Substance Use Topics  . Smoking status: Current Every Day Smoker -- 0.30 packs/day    Types: Cigarettes  . Smokeless tobacco: Not on file  . Alcohol Use: Yes     Comment: occ    Review of Systems  Constitutional: Negative for fever.  Respiratory: Positive for cough.   Musculoskeletal: Positive for back pain.  Neurological: Negative for weakness and numbness.  All other systems reviewed and are negative.   Allergies  Penicillins  Home Medications   Current Outpatient Rx  Name  Route  Sig  Dispense  Refill  . amLODipine (NORVASC) 5 MG tablet   Oral   Take 5 mg by mouth daily.         Marland Kitchen ibuprofen  (ADVIL,MOTRIN) 200 MG tablet   Oral   Take 600 mg by mouth every 6 (six) hours as needed for moderate pain.         Marland Kitchen lisinopril-hydrochlorothiazide (PRINZIDE,ZESTORETIC) 20-25 MG per tablet   Oral   Take 1 tablet by mouth daily.   90 tablet   3   . methocarbamol (ROBAXIN) 500 MG tablet   Oral   Take 1 tablet (500 mg total) by mouth 4 (four) times daily.   20 tablet   0   . oxyCODONE-acetaminophen (ROXICET) 5-325 MG per tablet   Oral   Take 1 tablet by mouth every 6 (six) hours as needed for moderate pain or severe pain.   20 tablet   0   . paliperidone (INVEGA) 6 MG 24 hr tablet   Oral   Take 6 mg by mouth daily.         . meloxicam (MOBIC) 7.5 MG tablet   Oral   Take 1 tablet (7.5 mg total) by mouth daily.   20 tablet   0   . methocarbamol (ROBAXIN) 500 MG tablet   Oral   Take 1 tablet (500 mg total) by mouth 2 (two) times daily as needed.   14 tablet   0    Triage Vitals: BP 145/100  Pulse 81  Temp(Src) 97.9 F (36.6 C) (Oral)  Resp 18  Wt 241 lb 1 oz (109.345 kg)  SpO2 93%  Physical Exam  Nursing note and vitals reviewed. Constitutional: He is oriented to person, place, and time. He appears well-developed and well-nourished. No distress.  HENT:  Head: Normocephalic and atraumatic.  Mouth/Throat: Oropharynx is clear and moist.  Eyes: Conjunctivae and EOM are normal. Pupils are equal, round, and reactive to light.  Neck: Normal range of motion. Neck supple.  Cardiovascular: Normal rate, regular rhythm and normal heart sounds.   Pulmonary/Chest: Effort normal and breath sounds normal. Not tachypneic. No respiratory distress. He has no decreased breath sounds. He has no wheezes. He has no rhonchi. He has no rales.  Abdominal: Soft. Bowel sounds are normal. There is no tenderness. There is no guarding.  Musculoskeletal: Normal range of motion.  Diffuse TTP of thoracic and lumbar paraspinal regions; no midline TTP, step-off, or deformity; full ROM  maintained; distal sensation intact; normal gait  Neurological: He is alert and oriented to person, place, and time.  Skin: Skin is warm and dry. He is not diaphoretic.  Psychiatric: He has a normal mood and affect.    ED Course  Procedures (including critical care time)  DIAGNOSTIC STUDIES: Oxygen Saturation is 93% on RA, adequate by my interpretation.    COORDINATION OF CARE: 11:06 AM- Will discharge with Robaxin and Mobic. Pt advised of plan for treatment and pt agrees.  Labs Review Labs Reviewed - No data to display Imaging Review No results found.  EKG Interpretation   None       MDM   1. Back pain   2. Cough    Chronic back pain without known recent trauma-- imaging deferred.  No signs/sx concerning for cauda equina.  Rx robaxin and mobic.  Pt will FU with his PCP if problems occur.  Discussed plan with pt, he agreed.  Return precautions advised.  I personally performed the services described in this documentation, which was scribed in my presence. The recorded information has been reviewed and is accurate.  Garlon Hatchet, PA-C 04/26/13 1400

## 2013-04-26 NOTE — ED Provider Notes (Signed)
Medical screening examination/treatment/procedure(s) were performed by non-physician practitioner and as supervising physician I was immediately available for consultation/collaboration.  EKG Interpretation   None         Cheyenne Schumm T Zarra Geffert, MD 04/26/13 1921 

## 2013-07-14 ENCOUNTER — Ambulatory Visit: Payer: Self-pay | Admitting: Internal Medicine

## 2014-05-26 ENCOUNTER — Emergency Department (HOSPITAL_COMMUNITY)
Admission: EM | Admit: 2014-05-26 | Discharge: 2014-05-26 | Disposition: A | Discharge status: Home / Self Care | Payer: Medicaid Other | Attending: Emergency Medicine | Admitting: Emergency Medicine

## 2014-05-26 ENCOUNTER — Encounter (HOSPITAL_COMMUNITY): Payer: Self-pay

## 2014-05-26 ENCOUNTER — Emergency Department (HOSPITAL_COMMUNITY): Payer: Medicaid Other

## 2014-05-26 DIAGNOSIS — J159 Unspecified bacterial pneumonia: Secondary | ICD-10-CM | POA: Insufficient documentation

## 2014-05-26 DIAGNOSIS — Z792 Long term (current) use of antibiotics: Secondary | ICD-10-CM | POA: Diagnosis not present

## 2014-05-26 DIAGNOSIS — Z79899 Other long term (current) drug therapy: Secondary | ICD-10-CM | POA: Diagnosis not present

## 2014-05-26 DIAGNOSIS — Z880 Allergy status to penicillin: Secondary | ICD-10-CM | POA: Diagnosis not present

## 2014-05-26 DIAGNOSIS — Z720 Tobacco use: Secondary | ICD-10-CM | POA: Insufficient documentation

## 2014-05-26 DIAGNOSIS — I10 Essential (primary) hypertension: Secondary | ICD-10-CM | POA: Diagnosis not present

## 2014-05-26 DIAGNOSIS — J189 Pneumonia, unspecified organism: Secondary | ICD-10-CM

## 2014-05-26 DIAGNOSIS — Z791 Long term (current) use of non-steroidal anti-inflammatories (NSAID): Secondary | ICD-10-CM | POA: Diagnosis not present

## 2014-05-26 DIAGNOSIS — R0602 Shortness of breath: Secondary | ICD-10-CM | POA: Diagnosis present

## 2014-05-26 MED ORDER — ALBUTEROL SULFATE HFA 108 (90 BASE) MCG/ACT IN AERS
2.0000 | INHALATION_SPRAY | RESPIRATORY_TRACT | Status: DC | PRN
  Administered 2014-05-26: 2 via RESPIRATORY_TRACT
  Filled 2014-05-26: qty 6.7

## 2014-05-26 MED ORDER — DOXYCYCLINE HYCLATE 100 MG PO CAPS: 100.0000 mg | ORAL_CAPSULE | Freq: Two times a day (BID) | ORAL | Status: DC

## 2014-05-26 MED ORDER — IPRATROPIUM-ALBUTEROL 0.5-2.5 (3) MG/3ML IN SOLN
3.0000 mL | Freq: Once | RESPIRATORY_TRACT | Status: AC
  Administered 2014-05-26: 3 mL via RESPIRATORY_TRACT
  Filled 2014-05-26: qty 3

## 2014-05-26 NOTE — ED Provider Notes (Signed)
CSN: [id number]     Arrival date & time 05/26/14  1110 History   First MD Initiated Contact with Patient 05/26/14 1156     Chief Complaint  Patient presents with  . Shortness of Breath     (Consider location/radiation/quality/duration/timing/severity/associated sxs/prior Treatment) Patient is a 59 y.o. male presenting with shortness of breath. The history is provided by the patient.  Shortness of Breath Associated symptoms: cough   Associated symptoms: no abdominal pain, no chest pain, no fever and no rash    patient with shortness of breath and cough for the last 3 months. States it is worse with exertion. Former smoker that quit a year ago. State he has had slight sputum production. No fevers. No swelling in his legs. No chest pain. No abdominal pain.  Past Medical History  Diagnosis Date  . Hypertension    History reviewed. No pertinent past surgical history. History reviewed. No pertinent family history. History  Substance Use Topics  . Smoking status: Current Every Day Smoker -- 0.30 packs/day    Types: Cigarettes  . Smokeless tobacco: Not on file  . Alcohol Use: Yes     Comment: occ    Review of Systems  Constitutional: Negative for fever and chills.  Respiratory: Positive for cough and shortness of breath.   Cardiovascular: Negative for chest pain.  Gastrointestinal: Negative for abdominal pain.  Genitourinary: Negative for dysuria and flank pain.  Musculoskeletal: Negative for back pain.  Skin: Negative for rash.  Neurological: Negative for numbness.      Allergies  Penicillins  Home Medications   Prior to Admission medications   Medication Sig Start Date End Date Taking? Authorizing Provider  amLODipine (NORVASC) 5 MG tablet Take 5 mg by mouth daily.    Historical Provider, MD  doxycycline (VIBRAMYCIN) 100 MG capsule Take 1 capsule (100 mg total) by mouth 2 (two) times daily. 05/26/14   Jasper Riling. Nylen Creque, MD  ibuprofen (ADVIL,MOTRIN) 200 MG tablet Take  600 mg by mouth every 6 (six) hours as needed for moderate pain.    Historical Provider, MD  lisinopril-hydrochlorothiazide (PRINZIDE,ZESTORETIC) 20-25 MG per tablet Take 1 tablet by mouth daily. 03/03/13   Debbe Odea, MD  meloxicam (MOBIC) 7.5 MG tablet Take 1 tablet (7.5 mg total) by mouth daily. 04/20/13   Larene Pickett, PA-C  methocarbamol (ROBAXIN) 500 MG tablet Take 1 tablet (500 mg total) by mouth 4 (four) times daily. 04/12/13   Liliane Bade, NP  methocarbamol (ROBAXIN) 500 MG tablet Take 1 tablet (500 mg total) by mouth 2 (two) times daily as needed. 04/20/13   Larene Pickett, PA-C  oxyCODONE-acetaminophen (ROXICET) 5-325 MG per tablet Take 1 tablet by mouth every 6 (six) hours as needed for moderate pain or severe pain. 04/12/13   Liliane Bade, NP  paliperidone (INVEGA) 6 MG 24 hr tablet Take 6 mg by mouth daily.    Historical Provider, MD   BP 199/107 mmHg  Pulse 61  Temp(Src) 97.5 F (36.4 C) (Oral)  Resp 20  Ht 5\' 11"  (1.803 m)  SpO2 99% Physical Exam  Constitutional: He appears well-developed.  HENT:  Head: Normocephalic.  Cardiovascular: Normal rate.   Pulmonary/Chest: Effort normal. He has wheezes.  Mild diffuse wheezes with localizing rales/rhonchi in left upper lung field.  Abdominal: There is no tenderness.  Musculoskeletal: He exhibits no edema.  Neurological: He is alert.  Skin: Skin is warm.    ED Course  Procedures (including critical care time) Labs Review Labs  Reviewed - No data to display  Imaging Review Dg Chest 2 View  05/26/2014   CLINICAL DATA:  Acute shortness of breath  EXAM: CHEST  2 VIEW  COMPARISON:  None.  FINDINGS: Mild cardiomegaly without CHF, pneumonia, collapse or consolidation. No effusion or pneumothorax. Trachea midline. Minor atherosclerosis of the aorta.  IMPRESSION: Cardiomegaly without acute process   Electronically Signed   By: Daryll Brod M.D.   On: 05/26/2014 13:22     EKG Interpretation None      MDM   Final  diagnoses:  CAP (community acquired pneumonia)    Patient with shortness of breath and cough for 3 months. Does have localizing lung findings the left upper field. Negative x-ray but due to the onset and the time course will treat with antibiotics. Will need to follow with his PCP. Also has been hypertensive here which will need following.    Jasper Riling. Alvino Chapel, MD 05/26/14 1513

## 2014-05-26 NOTE — Discharge Instructions (Signed)

## 2014-05-26 NOTE — ED Notes (Signed)
Pt reports shortness of breath x 3 months.  Sts it has been difficult for him to get around due to breathing. Inspiratory wheezing auscultated in bases.

## 2014-05-26 NOTE — ED Notes (Signed)
NAD at this time. Pt is stable and leaving with his brother.

## 2014-06-23 ENCOUNTER — Inpatient Hospital Stay (HOSPITAL_COMMUNITY)
Admission: EM | Admit: 2014-06-23 | Discharge: 2014-06-29 | DRG: 123 | Disposition: A | Discharge status: Home / Self Care | Payer: Medicaid Other | Attending: Internal Medicine | Admitting: Internal Medicine

## 2014-06-23 ENCOUNTER — Encounter (HOSPITAL_COMMUNITY): Payer: Self-pay | Admitting: Emergency Medicine

## 2014-06-23 DIAGNOSIS — N1832 Chronic kidney disease, stage 3b: Secondary | ICD-10-CM | POA: Diagnosis present

## 2014-06-23 DIAGNOSIS — N183 Chronic kidney disease, stage 3 (moderate): Secondary | ICD-10-CM | POA: Diagnosis present

## 2014-06-23 DIAGNOSIS — Z79899 Other long term (current) drug therapy: Secondary | ICD-10-CM

## 2014-06-23 DIAGNOSIS — F1910 Other psychoactive substance abuse, uncomplicated: Secondary | ICD-10-CM

## 2014-06-23 DIAGNOSIS — Z880 Allergy status to penicillin: Secondary | ICD-10-CM

## 2014-06-23 DIAGNOSIS — I517 Cardiomegaly: Secondary | ICD-10-CM | POA: Diagnosis present

## 2014-06-23 DIAGNOSIS — R778 Other specified abnormalities of plasma proteins: Secondary | ICD-10-CM

## 2014-06-23 DIAGNOSIS — H4710 Unspecified papilledema: Secondary | ICD-10-CM | POA: Diagnosis present

## 2014-06-23 DIAGNOSIS — H5462 Unqualified visual loss, left eye, normal vision right eye: Secondary | ICD-10-CM | POA: Diagnosis present

## 2014-06-23 DIAGNOSIS — I6529 Occlusion and stenosis of unspecified carotid artery: Secondary | ICD-10-CM | POA: Diagnosis present

## 2014-06-23 DIAGNOSIS — T380X5A Adverse effect of glucocorticoids and synthetic analogues, initial encounter: Secondary | ICD-10-CM | POA: Diagnosis present

## 2014-06-23 DIAGNOSIS — F1010 Alcohol abuse, uncomplicated: Secondary | ICD-10-CM | POA: Diagnosis present

## 2014-06-23 DIAGNOSIS — R7989 Other specified abnormal findings of blood chemistry: Secondary | ICD-10-CM | POA: Diagnosis present

## 2014-06-23 DIAGNOSIS — H469 Unspecified optic neuritis: Principal | ICD-10-CM | POA: Diagnosis present

## 2014-06-23 DIAGNOSIS — N189 Chronic kidney disease, unspecified: Secondary | ICD-10-CM | POA: Diagnosis present

## 2014-06-23 DIAGNOSIS — D696 Thrombocytopenia, unspecified: Secondary | ICD-10-CM | POA: Diagnosis present

## 2014-06-23 DIAGNOSIS — F17210 Nicotine dependence, cigarettes, uncomplicated: Secondary | ICD-10-CM | POA: Diagnosis present

## 2014-06-23 DIAGNOSIS — K7030 Alcoholic cirrhosis of liver without ascites: Secondary | ICD-10-CM | POA: Diagnosis present

## 2014-06-23 DIAGNOSIS — F1410 Cocaine abuse, uncomplicated: Secondary | ICD-10-CM | POA: Diagnosis present

## 2014-06-23 DIAGNOSIS — G35 Multiple sclerosis: Secondary | ICD-10-CM | POA: Diagnosis present

## 2014-06-23 DIAGNOSIS — R945 Abnormal results of liver function studies: Secondary | ICD-10-CM

## 2014-06-23 DIAGNOSIS — I10 Essential (primary) hypertension: Secondary | ICD-10-CM | POA: Diagnosis present

## 2014-06-23 DIAGNOSIS — E876 Hypokalemia: Secondary | ICD-10-CM | POA: Diagnosis present

## 2014-06-23 DIAGNOSIS — N179 Acute kidney failure, unspecified: Secondary | ICD-10-CM | POA: Diagnosis present

## 2014-06-23 DIAGNOSIS — R0602 Shortness of breath: Secondary | ICD-10-CM

## 2014-06-23 DIAGNOSIS — G8929 Other chronic pain: Secondary | ICD-10-CM | POA: Diagnosis present

## 2014-06-23 DIAGNOSIS — Z8673 Personal history of transient ischemic attack (TIA), and cerebral infarction without residual deficits: Secondary | ICD-10-CM

## 2014-06-23 DIAGNOSIS — F14188 Cocaine abuse with other cocaine-induced disorder: Secondary | ICD-10-CM | POA: Diagnosis present

## 2014-06-23 DIAGNOSIS — Z791 Long term (current) use of non-steroidal anti-inflammatories (NSAID): Secondary | ICD-10-CM

## 2014-06-23 DIAGNOSIS — I129 Hypertensive chronic kidney disease with stage 1 through stage 4 chronic kidney disease, or unspecified chronic kidney disease: Secondary | ICD-10-CM | POA: Diagnosis present

## 2014-06-23 DIAGNOSIS — M542 Cervicalgia: Secondary | ICD-10-CM

## 2014-06-23 DIAGNOSIS — N182 Chronic kidney disease, stage 2 (mild): Secondary | ICD-10-CM | POA: Diagnosis present

## 2014-06-23 DIAGNOSIS — D72829 Elevated white blood cell count, unspecified: Secondary | ICD-10-CM | POA: Diagnosis present

## 2014-06-23 HISTORY — DX: Cerebral infarction, unspecified: I63.9

## 2014-06-23 HISTORY — DX: Dorsalgia, unspecified: M54.9

## 2014-06-23 HISTORY — DX: Other chronic pain: G89.29

## 2014-06-23 LAB — CBC WITH DIFFERENTIAL/PLATELET
BASOS PCT: 0 % (ref 0–1)
Basophils Absolute: 0 10*3/uL (ref 0.0–0.1)
Eosinophils Absolute: 0.1 10*3/uL (ref 0.0–0.7)
Eosinophils Relative: 1 % (ref 0–5)
HEMATOCRIT: 39.8 % (ref 39.0–52.0)
HEMOGLOBIN: 13.9 g/dL (ref 13.0–17.0)
Lymphocytes Relative: 37 % (ref 12–46)
Lymphs Abs: 2.6 10*3/uL (ref 0.7–4.0)
MCH: 30.7 pg (ref 26.0–34.0)
MCHC: 34.9 g/dL (ref 30.0–36.0)
MCV: 87.9 fL (ref 78.0–100.0)
MONO ABS: 0.9 10*3/uL (ref 0.1–1.0)
MONOS PCT: 13 % — AB (ref 3–12)
NEUTROS ABS: 3.4 10*3/uL (ref 1.7–7.7)
Neutrophils Relative %: 49 % (ref 43–77)
Platelets: 101 10*3/uL — ABNORMAL LOW (ref 150–400)
RBC: 4.53 MIL/uL (ref 4.22–5.81)
RDW: 14 % (ref 11.5–15.5)
WBC: 7 10*3/uL (ref 4.0–10.5)

## 2014-06-23 LAB — I-STAT CHEM 8, ED
BUN: 12 mg/dL (ref 6–23)
CALCIUM ION: 1.09 mmol/L — AB (ref 1.12–1.23)
CHLORIDE: 101 mmol/L (ref 96–112)
CREATININE: 1.2 mg/dL (ref 0.50–1.35)
GLUCOSE: 105 mg/dL — AB (ref 70–99)
HCT: 46 % (ref 39.0–52.0)
Hemoglobin: 15.6 g/dL (ref 13.0–17.0)
POTASSIUM: 3.3 mmol/L — AB (ref 3.5–5.1)
Sodium: 142 mmol/L (ref 135–145)
TCO2: 25 mmol/L (ref 0–100)

## 2014-06-23 MED ORDER — HYDRALAZINE HCL 20 MG/ML IJ SOLN
10.0000 mg | INTRAMUSCULAR | Status: AC
  Administered 2014-06-24: 10 mg via INTRAVENOUS
  Filled 2014-06-23: qty 1

## 2014-06-23 NOTE — ED Provider Notes (Signed)
CSN: [id number]     Arrival date & time 06/23/14  1659 History   None    Chief Complaint  Patient presents with  . Blindness     (Consider location/radiation/quality/duration/timing/severity/associated sxs/prior Treatment) HPI Comments: Patient is a 59 year old male past medical history significant for hypertension, chronic back pain, CVA, tobacco abuse presenting to the emergency department from his ophthalmologist office for evaluation of left eye blindness. Patient states he awoke Monday without his vision. He denies any associated pain or precipitating trauma. Patient states his eye doctor was concerned for CVA vs giant cell arteritis. No modifying factors. Denies any fevers, chills, chest pain, shortness of breath.   Past Medical History  Diagnosis Date  . Hypertension   . Chronic back pain   . Stroke    History reviewed. No pertinent past surgical history. No family history on file. History  Substance Use Topics  . Smoking status: Current Every Day Smoker -- 0.30 packs/day    Types: Cigarettes  . Smokeless tobacco: Not on file  . Alcohol Use: Yes     Comment: occ    Review of Systems  Eyes: Positive for visual disturbance.  All other systems reviewed and are negative.     Allergies  Penicillins  Home Medications   Prior to Admission medications   Medication Sig Start Date End Date Taking? Authorizing Provider  ibuprofen (ADVIL,MOTRIN) 200 MG tablet Take 600 mg by mouth every 6 (six) hours as needed for moderate pain.   Yes Historical Provider, MD  lisinopril-hydrochlorothiazide (PRINZIDE,ZESTORETIC) 20-25 MG per tablet Take 1 tablet by mouth daily. 03/03/13  Yes Debbe Odea, MD  meloxicam (MOBIC) 7.5 MG tablet Take 1 tablet (7.5 mg total) by mouth daily. 04/20/13  Yes Larene Pickett, PA-C  doxycycline (VIBRAMYCIN) 100 MG capsule Take 1 capsule (100 mg total) by mouth 2 (two) times daily. Patient not taking: Reported on 06/23/2014 05/26/14   Jasper Riling. Pickering, MD   methocarbamol (ROBAXIN) 500 MG tablet Take 1 tablet (500 mg total) by mouth 4 (four) times daily. Patient not taking: Reported on 06/23/2014 04/12/13   Liliane Bade, NP  methocarbamol (ROBAXIN) 500 MG tablet Take 1 tablet (500 mg total) by mouth 2 (two) times daily as needed. Patient not taking: Reported on 06/23/2014 04/20/13   Larene Pickett, PA-C  oxyCODONE-acetaminophen (ROXICET) 5-325 MG per tablet Take 1 tablet by mouth every 6 (six) hours as needed for moderate pain or severe pain. Patient not taking: Reported on 06/23/2014 04/12/13   Liliane Bade, NP   BP 143/77 mmHg  Pulse 81  Temp(Src) 98.8 F (37.1 C) (Oral)  Resp 20  SpO2 90% Physical Exam  Constitutional: He is oriented to person, place, and time. He appears well-developed and well-nourished. No distress.  HENT:  Head: Normocephalic and atraumatic.  Right Ear: External ear normal.  Left Ear: External ear normal.  Nose: Nose normal.  Mouth/Throat: Oropharynx is clear and moist. No oropharyngeal exudate.  Eyes: Conjunctivae, EOM and lids are normal. Right pupil is round and reactive. Left pupil is not reactive. Left pupil is round. Pupils are equal.  Visual fields intact by confrontation in right eye, absent in left eye.  Neck: Normal range of motion. Neck supple.  Cardiovascular: Normal rate, regular rhythm, normal heart sounds and intact distal pulses.   Pulmonary/Chest: Effort normal and breath sounds normal. No respiratory distress.  Abdominal: Soft. There is no tenderness.  Neurological: He is alert and oriented to person, place, and time. He has  normal strength. No sensory deficit. Gait normal. GCS eye subscore is 4. GCS verbal subscore is 5. GCS motor subscore is 6.  Sensation grossly intact.  No pronator drift.  Bilateral heel-knee-shin intact.  Skin: Skin is warm and dry. He is not diaphoretic.  Nursing note and vitals reviewed.   ED Course  Procedures (including critical care time) Medications   ondansetron (ZOFRAN) injection 4 mg (0 mg Intravenous Hold 06/24/14 0259)  hydrALAZINE (APRESOLINE) injection 10 mg (10 mg Intravenous Given 06/24/14 0003)  gadobenate dimeglumine (MULTIHANCE) injection 20 mL (20 mLs Intravenous Contrast Given 06/24/14 0102)  promethazine (PHENERGAN) injection 25 mg (25 mg Intravenous Given 06/24/14 0256)    Labs Review Labs Reviewed  CBC WITH DIFFERENTIAL/PLATELET - Abnormal; Notable for the following:    Platelets 101 (*)    Monocytes Relative 13 (*)    All other components within normal limits  SEDIMENTATION RATE - Abnormal; Notable for the following:    Sed Rate 19 (*)    All other components within normal limits  CBC - Abnormal; Notable for the following:    HCT 38.0 (*)    Platelets 103 (*)    All other components within normal limits  COMPREHENSIVE METABOLIC PANEL - Abnormal; Notable for the following:    Glucose, Bld 66 (*)    Creatinine, Ser 1.43 (*)    Total Protein 8.4 (*)    Albumin 3.1 (*)    AST 118 (*)    ALT 70 (*)    Alkaline Phosphatase 183 (*)    Total Bilirubin 2.0 (*)    GFR calc non Af Amer 53 (*)    GFR calc Af Amer 61 (*)    All other components within normal limits  URINALYSIS, ROUTINE W REFLEX MICROSCOPIC - Abnormal; Notable for the following:    Color, Urine AMBER (*)    APPearance HAZY (*)    Urobilinogen, UA 2.0 (*)    All other components within normal limits  URINE RAPID DRUG SCREEN (HOSP PERFORMED) - Abnormal; Notable for the following:    Cocaine POSITIVE (*)    Tetrahydrocannabinol POSITIVE (*)    All other components within normal limits  I-STAT CHEM 8, ED - Abnormal; Notable for the following:    Potassium 3.3 (*)    Glucose, Bld 105 (*)    Calcium, Ion 1.09 (*)    All other components within normal limits  PROTIME-INR  APTT  DIFFERENTIAL  ETHANOL  C-REACTIVE PROTEIN  I-STAT TROPOININ, ED    Imaging Review Mr Jeri Cos Wo Contrast  06/24/2014   CLINICAL DATA:  Woke up with LEFT eye blindness 2-3  days ago, blurred vision, headaches, pressure behind eyes. History of hypertension and stroke.  EXAM: MRI HEAD WITHOUT CONTRAST  TECHNIQUE: Multiplanar, multiecho pulse sequences of the brain and surrounding structures were obtained without intravenous contrast.  COMPARISON:  None.  FINDINGS: Mild moderately motion degraded examination. No reduced diffusion to suggest acute ischemia. Punctate foci of susceptibility artifact within the deep gray nuclei. No midline shift, mass effect or mass lesions.  Disproportionate symmetric cerebral volume loss for age. LEFT greater than RIGHT confluent T2 hyperintense signal within the thalamus, relatively sparing of the thalamus. Additional punctate T2 hyperintensities in the bilateral basal ganglia suggestive lacunar infarcts and/or perivascular spaces. No abnormal parenchymal enhancement. Patchy T2 hyperintensities in the pons, moderate white matter changes suggest chronic small vessel ischemic disease.  No abnormal extra-axial fluid collections, extra-axial masses, abnormal enhancement. Dolichoectatic vascular flow voids at the skull  base.  Equivocal findings for T2 bright signal within the LEFT optic nerve, with equivocal superimposed enhancement on motion degraded coronal T1 post contrast sequences.  Less sphenoid sinusitis. The mastoid air cells are well aerated. No abnormal sellar expansion. No cerebellar tonsillar ectopia.  IMPRESSION: Mild to moderate motion degraded examination. Suspected edema and enhancement LEFT optic nerve, limited assessment, this may reflect optic neuritis, no evidence of ischemia.  Bilateral abnormal thalamic signal, this can be seen with hypertensive encephalopathy, metabolic disturbance, less likely Wernicke encephalopathy. Recommend followup.  Dolichoectatic Intracranial vessels suggest sequelae of chronic hypertension. Moderate white matter changes may reflect chronic small vessel ischemic disease.  Cerebellar volume loss, advanced for age.   Moderate white matter changes suggest chronic small vessel ischemic disease.  Acute findings discussed with and reconfirmed by Dr.Breahna Boylen on 06/24/2014 at 1:47 am.   Electronically Signed   By: Elon Alas   On: 06/24/2014 01:50     EKG Interpretation None      MDM   Final diagnoses:  Vision loss of left eye    Filed Vitals:   06/24/14 0330  BP: 143/77  Pulse: 81  Temp:   Resp: 20   Afebrile, NAD, non-toxic appearing, AAOx4.  I have reviewed nursing notes, vital signs, and all appropriate lab and imaging results for this patient.   Patient is a 59 year old male presented to the emergency department for acute vision loss in his left eye. States his prognosis painless. Denies any trauma. Visual field loss with reaction to light noted on physical exam otherwise no neurofocal deficits on examination. Given chronicity of symptoms MRI ordered for further evaluation. Patient was discussed with Dr. Armida Sans, Neuro Hospitalist, who has also reviewed the MRI results and recommend inpatient workup with MR orbits performed 24 hours after contrast is chest clear with possible LP as an inpatient for further evaluation of symptoms. Patient d/w with Dr. Venora Maples, agrees with plan.    Harlow Mares, PA-C 06/24/14 [place], MD 06/24/14 [phone]

## 2014-06-23 NOTE — ED Notes (Signed)
Pt st's when he woke up 2-3 days ago he could not see out of left eye.  Pt st's he went to eye doctor today and was told to come to ED for lab work.  Pt alert and oriented x's 3.

## 2014-06-24 ENCOUNTER — Encounter (HOSPITAL_COMMUNITY): Payer: Self-pay | Admitting: Internal Medicine

## 2014-06-24 ENCOUNTER — Inpatient Hospital Stay (HOSPITAL_COMMUNITY): Payer: Medicaid Other

## 2014-06-24 ENCOUNTER — Emergency Department (HOSPITAL_COMMUNITY): Payer: Medicaid Other

## 2014-06-24 DIAGNOSIS — N179 Acute kidney failure, unspecified: Secondary | ICD-10-CM

## 2014-06-24 DIAGNOSIS — R778 Other specified abnormalities of plasma proteins: Secondary | ICD-10-CM | POA: Diagnosis present

## 2014-06-24 DIAGNOSIS — D696 Thrombocytopenia, unspecified: Secondary | ICD-10-CM | POA: Diagnosis present

## 2014-06-24 DIAGNOSIS — I129 Hypertensive chronic kidney disease with stage 1 through stage 4 chronic kidney disease, or unspecified chronic kidney disease: Secondary | ICD-10-CM | POA: Diagnosis present

## 2014-06-24 DIAGNOSIS — H469 Unspecified optic neuritis: Secondary | ICD-10-CM | POA: Diagnosis present

## 2014-06-24 DIAGNOSIS — N182 Chronic kidney disease, stage 2 (mild): Secondary | ICD-10-CM | POA: Diagnosis present

## 2014-06-24 DIAGNOSIS — N1832 Chronic kidney disease, stage 3b: Secondary | ICD-10-CM | POA: Diagnosis present

## 2014-06-24 DIAGNOSIS — F14188 Cocaine abuse with other cocaine-induced disorder: Secondary | ICD-10-CM | POA: Diagnosis present

## 2014-06-24 DIAGNOSIS — F1010 Alcohol abuse, uncomplicated: Secondary | ICD-10-CM | POA: Diagnosis present

## 2014-06-24 DIAGNOSIS — Z8673 Personal history of transient ischemic attack (TIA), and cerebral infarction without residual deficits: Secondary | ICD-10-CM | POA: Diagnosis not present

## 2014-06-24 DIAGNOSIS — Z791 Long term (current) use of non-steroidal anti-inflammatories (NSAID): Secondary | ICD-10-CM | POA: Diagnosis not present

## 2014-06-24 DIAGNOSIS — T380X5A Adverse effect of glucocorticoids and synthetic analogues, initial encounter: Secondary | ICD-10-CM | POA: Diagnosis present

## 2014-06-24 DIAGNOSIS — D72829 Elevated white blood cell count, unspecified: Secondary | ICD-10-CM | POA: Diagnosis present

## 2014-06-24 DIAGNOSIS — H4710 Unspecified papilledema: Secondary | ICD-10-CM | POA: Diagnosis present

## 2014-06-24 DIAGNOSIS — N183 Chronic kidney disease, stage 3 (moderate): Secondary | ICD-10-CM | POA: Diagnosis present

## 2014-06-24 DIAGNOSIS — F1410 Cocaine abuse, uncomplicated: Secondary | ICD-10-CM | POA: Diagnosis present

## 2014-06-24 DIAGNOSIS — F1910 Other psychoactive substance abuse, uncomplicated: Secondary | ICD-10-CM | POA: Diagnosis present

## 2014-06-24 DIAGNOSIS — I517 Cardiomegaly: Secondary | ICD-10-CM | POA: Diagnosis present

## 2014-06-24 DIAGNOSIS — F17210 Nicotine dependence, cigarettes, uncomplicated: Secondary | ICD-10-CM | POA: Diagnosis present

## 2014-06-24 DIAGNOSIS — Z79899 Other long term (current) drug therapy: Secondary | ICD-10-CM | POA: Diagnosis not present

## 2014-06-24 DIAGNOSIS — H5462 Unqualified visual loss, left eye, normal vision right eye: Secondary | ICD-10-CM | POA: Diagnosis not present

## 2014-06-24 DIAGNOSIS — Z880 Allergy status to penicillin: Secondary | ICD-10-CM | POA: Diagnosis not present

## 2014-06-24 DIAGNOSIS — E876 Hypokalemia: Secondary | ICD-10-CM | POA: Diagnosis present

## 2014-06-24 DIAGNOSIS — R0602 Shortness of breath: Secondary | ICD-10-CM | POA: Diagnosis present

## 2014-06-24 DIAGNOSIS — K7030 Alcoholic cirrhosis of liver without ascites: Secondary | ICD-10-CM | POA: Diagnosis present

## 2014-06-24 DIAGNOSIS — R7989 Other specified abnormal findings of blood chemistry: Secondary | ICD-10-CM | POA: Diagnosis present

## 2014-06-24 DIAGNOSIS — I10 Essential (primary) hypertension: Secondary | ICD-10-CM | POA: Diagnosis present

## 2014-06-24 DIAGNOSIS — N189 Chronic kidney disease, unspecified: Secondary | ICD-10-CM | POA: Diagnosis present

## 2014-06-24 DIAGNOSIS — R945 Abnormal results of liver function studies: Secondary | ICD-10-CM

## 2014-06-24 DIAGNOSIS — G8929 Other chronic pain: Secondary | ICD-10-CM | POA: Diagnosis present

## 2014-06-24 DIAGNOSIS — I6529 Occlusion and stenosis of unspecified carotid artery: Secondary | ICD-10-CM | POA: Diagnosis present

## 2014-06-24 DIAGNOSIS — G35 Multiple sclerosis: Secondary | ICD-10-CM | POA: Diagnosis present

## 2014-06-24 LAB — CBC WITH DIFFERENTIAL/PLATELET
Basophils Absolute: 0 10*3/uL (ref 0.0–0.1)
Basophils Relative: 0 % (ref 0–1)
Eosinophils Absolute: 0 10*3/uL (ref 0.0–0.7)
Eosinophils Relative: 0 % (ref 0–5)
HCT: 39 % (ref 39.0–52.0)
Hemoglobin: 13.5 g/dL (ref 13.0–17.0)
LYMPHS PCT: 10 % — AB (ref 12–46)
Lymphs Abs: 0.9 10*3/uL (ref 0.7–4.0)
MCH: 30.5 pg (ref 26.0–34.0)
MCHC: 34.6 g/dL (ref 30.0–36.0)
MCV: 88 fL (ref 78.0–100.0)
Monocytes Absolute: 0.6 10*3/uL (ref 0.1–1.0)
Monocytes Relative: 7 % (ref 3–12)
NEUTROS ABS: 7.5 10*3/uL (ref 1.7–7.7)
NEUTROS PCT: 84 % — AB (ref 43–77)
Platelets: 94 10*3/uL — ABNORMAL LOW (ref 150–400)
RBC: 4.43 MIL/uL (ref 4.22–5.81)
RDW: 14.1 % (ref 11.5–15.5)
WBC: 8.9 10*3/uL (ref 4.0–10.5)

## 2014-06-24 LAB — BASIC METABOLIC PANEL
ANION GAP: 9 (ref 5–15)
BUN: 10 mg/dL (ref 6–23)
CO2: 28 mmol/L (ref 19–32)
Calcium: 8.7 mg/dL (ref 8.4–10.5)
Chloride: 103 mmol/L (ref 96–112)
Creatinine, Ser: 1.37 mg/dL — ABNORMAL HIGH (ref 0.50–1.35)
GFR, EST AFRICAN AMERICAN: 65 mL/min — AB (ref >90)
GFR, EST NON AFRICAN AMERICAN: 56 mL/min — AB (ref >90)
Glucose, Bld: 110 mg/dL — ABNORMAL HIGH (ref 70–99)
POTASSIUM: 3.2 mmol/L — AB (ref 3.5–5.1)
SODIUM: 140 mmol/L (ref 135–145)

## 2014-06-24 LAB — COMPREHENSIVE METABOLIC PANEL
ALT: 70 U/L — AB (ref 0–53)
AST: 118 U/L — ABNORMAL HIGH (ref 0–37)
Albumin: 3.1 g/dL — ABNORMAL LOW (ref 3.5–5.2)
Alkaline Phosphatase: 183 U/L — ABNORMAL HIGH (ref 39–117)
Anion gap: 11 (ref 5–15)
BILIRUBIN TOTAL: 2 mg/dL — AB (ref 0.3–1.2)
BUN: 9 mg/dL (ref 6–23)
CHLORIDE: 99 mmol/L (ref 96–112)
CO2: 28 mmol/L (ref 19–32)
Calcium: 8.8 mg/dL (ref 8.4–10.5)
Creatinine, Ser: 1.43 mg/dL — ABNORMAL HIGH (ref 0.50–1.35)
GFR calc Af Amer: 61 mL/min — ABNORMAL LOW (ref >90)
GFR, EST NON AFRICAN AMERICAN: 53 mL/min — AB (ref >90)
Glucose, Bld: 66 mg/dL — ABNORMAL LOW (ref 70–99)
POTASSIUM: 3.5 mmol/L (ref 3.5–5.1)
SODIUM: 138 mmol/L (ref 135–145)
Total Protein: 8.4 g/dL — ABNORMAL HIGH (ref 6.0–8.3)

## 2014-06-24 LAB — URINALYSIS, ROUTINE W REFLEX MICROSCOPIC
BILIRUBIN URINE: NEGATIVE
GLUCOSE, UA: NEGATIVE mg/dL
Hgb urine dipstick: NEGATIVE
KETONES UR: NEGATIVE mg/dL
Leukocytes, UA: NEGATIVE
Nitrite: NEGATIVE
Protein, ur: NEGATIVE mg/dL
Specific Gravity, Urine: 1.018 (ref 1.005–1.030)
Urobilinogen, UA: 2 mg/dL — ABNORMAL HIGH (ref 0.0–1.0)
pH: 6.5 (ref 5.0–8.0)

## 2014-06-24 LAB — CBC
HEMATOCRIT: 38 % — AB (ref 39.0–52.0)
Hemoglobin: 13.1 g/dL (ref 13.0–17.0)
MCH: 29.8 pg (ref 26.0–34.0)
MCHC: 34.5 g/dL (ref 30.0–36.0)
MCV: 86.6 fL (ref 78.0–100.0)
Platelets: 103 10*3/uL — ABNORMAL LOW (ref 150–400)
RBC: 4.39 MIL/uL (ref 4.22–5.81)
RDW: 13.9 % (ref 11.5–15.5)
WBC: 8 10*3/uL (ref 4.0–10.5)

## 2014-06-24 LAB — D-DIMER, QUANTITATIVE (NOT AT ARMC): D DIMER QUANT: 0.56 ug{FEU}/mL — AB (ref 0.00–0.48)

## 2014-06-24 LAB — DIFFERENTIAL
BASOS PCT: 0 % (ref 0–1)
Basophils Absolute: 0 10*3/uL (ref 0.0–0.1)
Eosinophils Absolute: 0.1 10*3/uL (ref 0.0–0.7)
Eosinophils Relative: 1 % (ref 0–5)
LYMPHS ABS: 2.8 10*3/uL (ref 0.7–4.0)
LYMPHS PCT: 35 % (ref 12–46)
Monocytes Absolute: 0.9 10*3/uL (ref 0.1–1.0)
Monocytes Relative: 11 % (ref 3–12)
NEUTROS ABS: 4.3 10*3/uL (ref 1.7–7.7)
NEUTROS PCT: 53 % (ref 43–77)

## 2014-06-24 LAB — RAPID URINE DRUG SCREEN, HOSP PERFORMED
Amphetamines: NOT DETECTED
Barbiturates: NOT DETECTED
Benzodiazepines: NOT DETECTED
COCAINE: POSITIVE — AB
OPIATES: NOT DETECTED
Tetrahydrocannabinol: POSITIVE — AB

## 2014-06-24 LAB — HEPATIC FUNCTION PANEL
ALT: 67 U/L — ABNORMAL HIGH (ref 0–53)
AST: 114 U/L — ABNORMAL HIGH (ref 0–37)
Albumin: 2.9 g/dL — ABNORMAL LOW (ref 3.5–5.2)
Alkaline Phosphatase: 138 U/L — ABNORMAL HIGH (ref 39–117)
BILIRUBIN DIRECT: 1.2 mg/dL — AB (ref 0.0–0.5)
BILIRUBIN INDIRECT: 1 mg/dL — AB (ref 0.3–0.9)
BILIRUBIN TOTAL: 2.2 mg/dL — AB (ref 0.3–1.2)
Total Protein: 8.2 g/dL (ref 6.0–8.3)

## 2014-06-24 LAB — TROPONIN I
Troponin I: 0.04 ng/mL — ABNORMAL HIGH (ref <0.031)
Troponin I: 0.03 ng/mL (ref <0.031)
Troponin I: 0.04 ng/mL — ABNORMAL HIGH (ref <0.031)
Troponin I: 0.06 ng/mL — ABNORMAL HIGH (ref <0.031)

## 2014-06-24 LAB — ETHANOL: Alcohol, Ethyl (B): <5 mg/dL (ref 0–9)

## 2014-06-24 LAB — LIPID PANEL
CHOL/HDL RATIO: 3.7 ratio
CHOLESTEROL: 111 mg/dL (ref 0–200)
HDL: 30 mg/dL — ABNORMAL LOW (ref >39)
LDL Cholesterol: 67 mg/dL (ref 0–99)
TRIGLYCERIDES: 70 mg/dL (ref <150)
VLDL: 14 mg/dL (ref 0–40)

## 2014-06-24 LAB — LACTATE DEHYDROGENASE: LDH: 236 U/L (ref 94–250)

## 2014-06-24 LAB — APTT: aPTT: 33 seconds (ref 24–37)

## 2014-06-24 LAB — AMMONIA: Ammonia: 31 umol/L (ref 11–32)

## 2014-06-24 LAB — HIV ANTIBODY (ROUTINE TESTING W REFLEX): HIV SCREEN 4TH GENERATION: NONREACTIVE

## 2014-06-24 LAB — I-STAT TROPONIN, ED: Troponin i, poc: 0 ng/mL (ref 0.00–0.08)

## 2014-06-24 LAB — PROTIME-INR
INR: 1.13 (ref 0.00–1.49)
Prothrombin Time: 14.6 seconds (ref 11.6–15.2)

## 2014-06-24 LAB — C-REACTIVE PROTEIN: CRP: <0.5 mg/dL — ABNORMAL LOW (ref <0.60)

## 2014-06-24 LAB — SEDIMENTATION RATE: Sed Rate: 19 mm/hr — ABNORMAL HIGH (ref 0–16)

## 2014-06-24 LAB — BRAIN NATRIURETIC PEPTIDE: B Natriuretic Peptide: 1168.5 pg/mL — ABNORMAL HIGH (ref 0.0–100.0)

## 2014-06-24 LAB — TSH: TSH: 0.713 u[IU]/mL (ref 0.350–4.500)

## 2014-06-24 MED ORDER — POTASSIUM CHLORIDE CRYS ER 20 MEQ PO TBCR
40.0000 meq | EXTENDED_RELEASE_TABLET | Freq: Once | ORAL | Status: DC
  Filled 2014-06-24: qty 2

## 2014-06-24 MED ORDER — PANTOPRAZOLE SODIUM 40 MG IV SOLR
40.0000 mg | INTRAVENOUS | Status: DC
  Administered 2014-06-24: 40 mg via INTRAVENOUS
  Filled 2014-06-24: qty 40

## 2014-06-24 MED ORDER — SENNOSIDES-DOCUSATE SODIUM 8.6-50 MG PO TABS: 1.0000 | ORAL_TABLET | Freq: Every evening | ORAL | Status: DC | PRN

## 2014-06-24 MED ORDER — LORAZEPAM 2 MG/ML IJ SOLN
1.0000 mg | Freq: Four times a day (QID) | INTRAMUSCULAR | Status: AC | PRN
Start: 2014-06-24 — End: 2014-06-27
  Administered 2014-06-24: 1 mg via INTRAVENOUS
  Filled 2014-06-24: qty 1

## 2014-06-24 MED ORDER — AMLODIPINE BESYLATE 10 MG PO TABS
10.0000 mg | ORAL_TABLET | Freq: Every day | ORAL | Status: DC
  Administered 2014-06-25 – 2014-06-29 (×5): 10 mg via ORAL
  Filled 2014-06-24 (×5): qty 1

## 2014-06-24 MED ORDER — PROMETHAZINE HCL 25 MG/ML IJ SOLN
25.0000 mg | Freq: Once | INTRAMUSCULAR | Status: AC
  Administered 2014-06-24: 25 mg via INTRAVENOUS
  Filled 2014-06-24: qty 1

## 2014-06-24 MED ORDER — GADOBENATE DIMEGLUMINE 529 MG/ML IV SOLN
20.0000 mL | Freq: Once | INTRAVENOUS | Status: AC | PRN
Start: 2014-06-24 — End: 2014-06-24
  Administered 2014-06-24: 20 mL via INTRAVENOUS

## 2014-06-24 MED ORDER — ONDANSETRON HCL 4 MG/2ML IJ SOLN: 4.0000 mg | Freq: Once | INTRAMUSCULAR | Status: DC

## 2014-06-24 MED ORDER — VITAMIN B-1 100 MG PO TABS
100.0000 mg | ORAL_TABLET | Freq: Every day | ORAL | Status: DC
  Administered 2014-06-25 – 2014-06-29 (×5): 100 mg via ORAL
  Filled 2014-06-24 (×6): qty 1

## 2014-06-24 MED ORDER — HYDRALAZINE HCL 20 MG/ML IJ SOLN
10.0000 mg | INTRAMUSCULAR | Status: DC | PRN
  Administered 2014-06-24: 10 mg via INTRAVENOUS
  Filled 2014-06-24 (×2): qty 1

## 2014-06-24 MED ORDER — ADULT MULTIVITAMIN W/MINERALS CH
1.0000 | ORAL_TABLET | Freq: Every day | ORAL | Status: DC
  Administered 2014-06-25 – 2014-06-29 (×5): 1 via ORAL
  Filled 2014-06-24 (×6): qty 1

## 2014-06-24 MED ORDER — FOLIC ACID 1 MG PO TABS
1.0000 mg | ORAL_TABLET | Freq: Every day | ORAL | Status: DC
  Administered 2014-06-25 – 2014-06-29 (×5): 1 mg via ORAL
  Filled 2014-06-24 (×7): qty 1

## 2014-06-24 MED ORDER — LORAZEPAM 1 MG PO TABS: 1.0000 mg | ORAL_TABLET | Freq: Four times a day (QID) | ORAL | Status: AC | PRN

## 2014-06-24 MED ORDER — ASPIRIN EC 325 MG PO TBEC
325.0000 mg | DELAYED_RELEASE_TABLET | Freq: Every day | ORAL | Status: DC
  Administered 2014-06-25 – 2014-06-29 (×5): 325 mg via ORAL
  Filled 2014-06-24 (×6): qty 1

## 2014-06-24 MED ORDER — SODIUM CHLORIDE 0.9 % IV SOLN
INTRAVENOUS | Status: AC
  Administered 2014-06-24: 13:00:00 via INTRAVENOUS

## 2014-06-24 MED ORDER — SODIUM CHLORIDE 0.9 % IV SOLN
1000.0000 mg | Freq: Every day | INTRAVENOUS | Status: AC
  Administered 2014-06-25 – 2014-06-26 (×2): 1000 mg via INTRAVENOUS
  Filled 2014-06-24 (×3): qty 8

## 2014-06-24 MED ORDER — THIAMINE HCL 100 MG/ML IJ SOLN
100.0000 mg | Freq: Every day | INTRAMUSCULAR | Status: DC
  Administered 2014-06-24: 100 mg via INTRAVENOUS
  Filled 2014-06-24: qty 2

## 2014-06-24 MED ORDER — AMLODIPINE BESYLATE 5 MG PO TABS
5.0000 mg | ORAL_TABLET | Freq: Every day | ORAL | Status: DC
  Administered 2014-06-24: 5 mg via ORAL
  Filled 2014-06-24: qty 1

## 2014-06-24 NOTE — Progress Notes (Signed)
Pt arrived on unit rm [id number] 0420hrs, A&Ox4, denies pain, x1 episode N/V, antiemetic administered 90 minutes prior in ED. Significant vision issues left side NIH -4 based on vision and limb ataxia that is possibly related to vision difficulties. Pt oriented to room equipment, MD notified and advises will see pt direct approx 1 hour. Current admission orders implemented.

## 2014-06-24 NOTE — Consult Note (Addendum)
NEURO HOSPITALIST CONSULT NOTE    Reason for Consult: visual loss left eye  HPI:                                                                                                                                          John [name] is an 59 y.o. male with a past medical history significant for HTN, CVA, chronic low back pain, cocaine and marijuana use, presenting to the emergency department from his ophthalmologist office for evaluation of left eye blindness. John [name] indicated that he woke up this past Monday with visual loss of his left eye that got worse over the following days. Never had similar symptom before. He denies associated eye pain or HA, face weakness, focal arm-legs weakness, slurred speech, difficulty swallowing, imbalance, language impairment, bladder or bowel impairment, nausea or vomiting. No recent fever or infection. In addition, no reported cognitive changes, no myoclonus. MRI brain with and without contrast with suspected edema andenhancement LEFT optic nerve, limited assessment, this may reflect optic neuritis, no evidence of ischemia. FLAIR images showed also bilateral abnormal thalamic signal that were not present on DWI.  ESR and C-reactive protein pending.   Past Medical History  Diagnosis Date  . Hypertension   . Chronic back pain   . Stroke     History reviewed. No pertinent past surgical history.  No family history on file.  Family History: no epilepsy, brain tumors, or brain aneurysms, no MS   Social History:  reports that he has been smoking Cigarettes.  He has been smoking about 0.30 packs per day. He does not have any smokeless tobacco history on file. He reports that he drinks alcohol. He reports that he uses illicit drugs (Cocaine and Marijuana).  Allergies  Allergen Reactions  . Penicillins     Convulsions    MEDICATIONS:                                                                                                                      I have reviewed the patient's current medications.   ROS:  History obtained from the patient and chart review.  General ROS: negative for - chills, fatigue, fever, night sweats, weight gain or weight loss Psychological ROS: negative for - behavioral disorder, hallucinations, memory difficulties, mood swings or suicidal ideation Ophthalmic ROS: negative for - blurry vision, double vision ENT ROS: negative for - epistaxis, nasal discharge, oral lesions, sore throat, tinnitus or vertigo Allergy and Immunology ROS: negative for - hives or itchy/watery eyes Hematological and Lymphatic ROS: negative for - bleeding problems, bruising or swollen lymph nodes Endocrine ROS: negative for - galactorrhea, hair pattern changes, polydipsia/polyuria or temperature intolerance Respiratory ROS: negative for - cough, hemoptysis, shortness of breath or wheezing Cardiovascular ROS: negative for - chest pain, dyspnea on exertion, edema or irregular heartbeat Gastrointestinal ROS: negative for - abdominal pain, diarrhea, hematemesis, nausea/vomiting or stool incontinence Genito-Urinary ROS: negative for - dysuria, hematuria, incontinence or urinary frequency/urgency Musculoskeletal ROS: negative for - joint swelling or muscular weakness Neurological ROS: as noted in HPI Dermatological ROS: negative for rash and skin lesion changes  Physical exam: pleasant male in no apparent distress. Blood pressure 176/94, pulse 70, temperature 98.8 F (37.1 C), temperature source Oral, resp. rate 21, SpO2 97 %. Head: normocephalic. Neck: supple, no bruits, no JVD. Cardiac: no murmurs. Lungs: clear. Abdomen: soft, no tender, no mass. Extremities: no edema. Skin: no edema Neurologic Examination:                                                                                                       General: Mental Status: Alert, oriented, thought content appropriate.  Speech fluent without evidence of aphasia.  Able to follow 3 step commands without difficulty. Cranial Nerves: II: Discs flat bilaterally; Visual acuity and visual fields grossly normal in the right but can not count fingers in the left, the pupils equal, round, reactive to light and accommodation III,IV, VI: ptosis not present, extra-ocular motions intact bilaterally V,VII: smile symmetric, facial light touch sensation normal bilaterally VIII: hearing normal bilaterally IX,X: gag reflex present XI: bilateral shoulder shrug XII: midline tongue extension without atrophy or fasciculations Motor: Right : Upper extremity   5/5    Left:     Upper extremity   5/5  Lower extremity   5/5     Lower extremity   5/5 Tone and bulk:normal tone throughout; no atrophy noted Sensory: Pinprick and light touch intact throughout, bilaterally Deep Tendon Reflexes:  Right: Upper Extremity   Left: Upper extremity   biceps (C-5 to C-6) 2/4   biceps (C-5 to C-6) 2/4 tricep (C7) 2/4    triceps (C7) 2/4 Brachioradialis (C6) 2/4  Brachioradialis (C6) 2/4  Lower Extremity Lower Extremity  quadriceps (L-2 to L-4) 2/4   quadriceps (L-2 to L-4) 2/4 Achilles (S1) 2/4   Achilles (S1) 2/4  Plantars: Right: downgoing   Left: downgoing Cerebellar: normal finger-to-nose,  normal heel-to-shin test Gait:  Deferred for safety reasons    No results found for: CHOL  Results for orders placed or performed during the hospital encounter of 06/23/14 (from the past 48 hour(s))  CBC with Differential  Status: Abnormal   Collection Time: 06/23/14  5:26 PM  Result Value Ref Range   WBC 7.0 4.0 - 10.5 K/uL   RBC 4.53 4.22 - 5.81 MIL/uL   Hemoglobin 13.9 13.0 - 17.0 g/dL   HCT 39.8 39.0 - 52.0 %   MCV 87.9 78.0 - 100.0 fL   MCH 30.7 26.0 - 34.0 pg   MCHC 34.9 30.0 - 36.0 g/dL   RDW 14.0 11.5 - 15.5 %   Platelets 101 (L)  150 - 400 K/uL    Comment: REPEATED TO VERIFY SPECIMEN CHECKED FOR CLOTS PLATELET COUNT CONFIRMED BY SMEAR    Neutrophils Relative % 49 43 - 77 %   Neutro Abs 3.4 1.7 - 7.7 K/uL   Lymphocytes Relative 37 12 - 46 %   Lymphs Abs 2.6 0.7 - 4.0 K/uL   Monocytes Relative 13 (H) 3 - 12 %   Monocytes Absolute 0.9 0.1 - 1.0 K/uL   Eosinophils Relative 1 0 - 5 %   Eosinophils Absolute 0.1 0.0 - 0.7 K/uL   Basophils Relative 0 0 - 1 %   Basophils Absolute 0.0 0.0 - 0.1 K/uL  I-Stat Chem 8, ED     Status: Abnormal   Collection Time: 06/23/14  5:34 PM  Result Value Ref Range   Sodium 142 135 - 145 mmol/L   Potassium 3.3 (L) 3.5 - 5.1 mmol/L   Chloride 101 96 - 112 mmol/L   BUN 12 6 - 23 mg/dL   Creatinine, Ser 1.20 0.50 - 1.35 mg/dL   Glucose, Bld 105 (H) 70 - 99 mg/dL   Calcium, Ion 1.09 (L) 1.12 - 1.23 mmol/L   TCO2 25 0 - 100 mmol/L   Hemoglobin 15.6 13.0 - 17.0 g/dL   HCT 46.0 39.0 - 52.0 %  Protime-INR     Status: None   Collection Time: 06/23/14 10:14 PM  Result Value Ref Range   Prothrombin Time 14.6 11.6 - 15.2 seconds   INR 1.13 0.00 - 1.49  APTT     Status: None   Collection Time: 06/23/14 10:14 PM  Result Value Ref Range   aPTT 33 24 - 37 seconds  CBC     Status: Abnormal   Collection Time: 06/23/14 10:14 PM  Result Value Ref Range   WBC 8.0 4.0 - 10.5 K/uL   RBC 4.39 4.22 - 5.81 MIL/uL   Hemoglobin 13.1 13.0 - 17.0 g/dL   HCT 38.0 (L) 39.0 - 52.0 %   MCV 86.6 78.0 - 100.0 fL   MCH 29.8 26.0 - 34.0 pg   MCHC 34.5 30.0 - 36.0 g/dL   RDW 13.9 11.5 - 15.5 %   Platelets 103 (L) 150 - 400 K/uL    Comment: REPEATED TO VERIFY CONSISTENT WITH PREVIOUS RESULT   Differential     Status: None   Collection Time: 06/23/14 10:14 PM  Result Value Ref Range   Neutrophils Relative % 53 43 - 77 %   Neutro Abs 4.3 1.7 - 7.7 K/uL   Lymphocytes Relative 35 12 - 46 %   Lymphs Abs 2.8 0.7 - 4.0 K/uL   Monocytes Relative 11 3 - 12 %   Monocytes Absolute 0.9 0.1 - 1.0 K/uL    Eosinophils Relative 1 0 - 5 %   Eosinophils Absolute 0.1 0.0 - 0.7 K/uL   Basophils Relative 0 0 - 1 %   Basophils Absolute 0.0 0.0 - 0.1 K/uL  Comprehensive metabolic panel     Status: Abnormal  Collection Time: 06/23/14 10:14 PM  Result Value Ref Range   Sodium 138 135 - 145 mmol/L   Potassium 3.5 3.5 - 5.1 mmol/L   Chloride 99 96 - 112 mmol/L   CO2 28 19 - 32 mmol/L   Glucose, Bld 66 (L) 70 - 99 mg/dL   BUN 9 6 - 23 mg/dL   Creatinine, Ser 1.43 (H) 0.50 - 1.35 mg/dL   Calcium 8.8 8.4 - 10.5 mg/dL   Total Protein 8.4 (H) 6.0 - 8.3 g/dL   Albumin 3.1 (L) 3.5 - 5.2 g/dL   AST 118 (H) 0 - 37 U/L   ALT 70 (H) 0 - 53 U/L   Alkaline Phosphatase 183 (H) 39 - 117 U/L   Total Bilirubin 2.0 (H) 0.3 - 1.2 mg/dL   GFR calc non Af Amer 53 (L) >90 mL/min   GFR calc Af Amer 61 (L) >90 mL/min    Comment: (NOTE) The eGFR has been calculated using the CKD EPI equation. This calculation has not been validated in all clinical situations. eGFR's persistently <90 mL/min signify possible Chronic Kidney Disease.    Anion gap 11 5 - 15  I-stat troponin, ED (not at [facility])     Status: None   Collection Time: 06/23/14 11:55 PM  Result Value Ref Range   Troponin i, poc 0.00 0.00 - 0.08 ng/mL   Comment 3            Comment: Due to the release kinetics of cTnI, a negative result within the first hours of the onset of symptoms does not rule out myocardial infarction with certainty. If myocardial infarction is still suspected, repeat the test at appropriate intervals.     Mr Jeri Cos Wo Contrast  06/24/2014   CLINICAL DATA:  Woke up with LEFT eye blindness 2-3 days ago, blurred vision, headaches, pressure behind eyes. History of hypertension and stroke.  EXAM: MRI HEAD WITHOUT CONTRAST  TECHNIQUE: Multiplanar, multiecho pulse sequences of the brain and surrounding structures were obtained without intravenous contrast.  COMPARISON:  None.  FINDINGS: Mild moderately motion degraded examination. No reduced  diffusion to suggest acute ischemia. Punctate foci of susceptibility artifact within the deep gray nuclei. No midline shift, mass effect or mass lesions.  Disproportionate symmetric cerebral volume loss for age. LEFT greater than RIGHT confluent T2 hyperintense signal within the thalamus, relatively sparing of the thalamus. Additional punctate T2 hyperintensities in the bilateral basal ganglia suggestive lacunar infarcts and/or perivascular spaces. No abnormal parenchymal enhancement. Patchy T2 hyperintensities in the pons, moderate white matter changes suggest chronic small vessel ischemic disease.  No abnormal extra-axial fluid collections, extra-axial masses, abnormal enhancement. Dolichoectatic vascular flow voids at the skull base.  Equivocal findings for T2 bright signal within the LEFT optic nerve, with equivocal superimposed enhancement on motion degraded coronal T1 post contrast sequences.  Less sphenoid sinusitis. The mastoid air cells are well aerated. No abnormal sellar expansion. No cerebellar tonsillar ectopia.  IMPRESSION: Mild to moderate motion degraded examination. Suspected edema and enhancement LEFT optic nerve, limited assessment, this may reflect optic neuritis, no evidence of ischemia.  Bilateral abnormal thalamic signal, this can be seen with hypertensive encephalopathy, metabolic disturbance, less likely Wernicke encephalopathy. Recommend followup.  Dolichoectatic Intracranial vessels suggest sequelae of chronic hypertension. Moderate white matter changes may reflect chronic small vessel ischemic disease.  Cerebellar volume loss, advanced for age.  Moderate white matter changes suggest chronic small vessel ischemic disease.  Acute findings discussed with and reconfirmed by Dr.JENNIFER PIEPENBRINK on  06/24/2014 at 1:47 am.   Electronically Signed   By: Elon Alas   On: 06/24/2014 01:50   Assessment/Plan: 59 y/o with HTN, CVA, cocaine and marijuana use, comes in with painless visual  loss left eye that developed 4 days ago. Ophthalmological evaluation did not reveal intrinsic eye disease and patient sent to the ED for further evaluation. Except for visual loss left eye, neuro-exam is unimpressive. MRI brain with equivocal findings of suspected edema and enhancement left optic nerve that may reflect optic neuritis. In addition, high signal intensity seen in bilateral thalami in FLAIR images but not DWI. No demyelinating lesions ESR and C-reactive protein pending. Etiology of patient's visual loss is not clear at this time. Left optic neuritis high in the differential. The presence of abnormal signal intensity in FLAIR images but not DWI is intriguing ( ? Heidenhain variant sporadic CJD with isolated visual loss, very uncommon). No the appropriate age or the typical presentation for giant cell arteritis or neuromyelitis optica spectrum disorder. Admission to medicine. MRI orbits with fat suppression. IV methylprednisolone. Will follow up.     Dorian Pod, MD 06/24/2014, 2:20 AM  [facility]

## 2014-06-24 NOTE — Clinical Documentation Improvement (Signed)
Possible Clinical Conditions?  Encephalopathy (describe type if known)                       Hypertensive                       Metabolic                       Toxic Hyponatremia / Hypernatremia Other Condition Cannot Clinically Determine   Supporting Information:  Risk Factors:"Hypertensive urgency"   Diagnostics:BRAIN MRI 06-23-14 MPRESSION: Mild to moderate motion degraded examination. Suspected edema and enhancement LEFT optic nerve, limited assessment, this may reflect optic neuritis, no evidence of ischemia. Bilateral abnormal thalamic signal, this can be seen with hypertensive encephalopathy, metabolic disturbance, less likely Wernicke encephalopathy. Recommend followup. Dolichoectatic Intracranial vessels suggest sequelae of chronic hypertension. Moderate white matter changes may reflect chronic small vessel ischemic disease. Cerebellar volume loss, advanced for age. Moderate white matter changes suggest chronic small vessel ischemic disease.  Thank You, Alessandra Grout, RN, BSN, CCDS,Clinical Documentation Specialist:  [phone]  [phone]=[employer]- Health Information Management

## 2014-06-24 NOTE — ED Notes (Signed)
Transporting patient to new room assignment. 

## 2014-06-24 NOTE — Progress Notes (Signed)
UR COMPLETED  

## 2014-06-24 NOTE — Progress Notes (Signed)
Patient ID: John [name]  male  UEA:[id number]    DOB: [birth year]    DOA: 06/23/2014  PCP: No PCP Per Patient   Brief history of present illness  John [name] is a 59 y.o. male with history of hypertension chronic low back pain and polysubstance abuse who was referred to the ER by ophthalmologist after patient was complaining of left eye vision loss. Patient reported that he had sudden loss of vision since Monday 5 days ago. Patient denieD any loss of function of the upper or lower extremities denies any headache fever chills nausea vomiting dizziness or loss of consciousness. In the ER patient had MRI of the brain which shows possible left optic neuritis and also bilateral abnormal thalamic signal. Patient was seen by neurology on call, recommended further management.  In addition patient reported that over the last 2 weeks he had having some shortness of breath, denied any chest pain productive cough fever chills.   Assessment/Plan: Principal Problem:   Vision loss, left eye: ? Optic neuritis - MRI of the brain showed suspected edema and enhancement left optic nerve, limited assessment, may reflect optic neuritis, no evidence of ischemia. Bilateral abnormal thalamic signal can be seen with hypertensive encephalopathy metabolic disturbance less likely Wernicke's encephalopathy. Chronic small vessel ischemic disease. - Neurology recommended MRI of the orbits to assess for optic neuritis, ESR 19, CRP low - UDS positive for marijuana and cocaine  Active Problems:   Accelerated hypertension - BP currently improving, continue Norvasc and IV hydralazine as needed    Elevated LFTs - Unclear etiology secondary to alcohol use?, abdominal ultrasound pending  - check hepatitis panel     Polysubstance abuse - UDS positive for cocaine and marijuana     Acute renal failure - Continue to hold his lisinopril, HCTZ, ibuprofen, Mobic  - Continue gentle hydration for now     SOB (shortness of  breath) - BNP elevated at 1168, troponin slightly elevated 0.04, will check 2-D echocardiogram, serial cardiac enzymes     Thrombocytopenia with elevated LFTs -  monitor counts closely   alcohol use - Place on CIWA protocol, thiamine, folic acid     Hypokalemia - Replaced    Elevated troponin: Possibly due to cocaine use and accelerated hypertension -No chest pain currently, placed on aspirin, no beta blockers secondary to cocaine use, no ACE inhibitor secondary to acute renal insufficiency - Check lipid panel although no statins due to transaminitis -  Check 2-D echo   DVT Prophylaxis: SCD's   Code Status:full code  Family Communication:   Disposition:   Consultants:  Neuro  Procedures:  MRI brain  Antibiotics:  None    Subjective:  No significant improvement in visual loss   Objective: Weight change:   Intake/Output Summary (Last 24 hours) at 06/24/14 1058 Last data filed at 06/24/14 0754  Gross per 24 hour  Intake      0 ml  Output    200 ml  Net   -200 ml   Blood pressure 165/96, pulse 89, temperature 97.7 F (36.5 C), temperature source Oral, resp. rate 20, height '5\' 10"'  (1.778 m), weight 113.399 kg (250 lb), SpO2 99 %.  Physical Exam: General: Alert and awake, oriented x3, not in any acute distress. CVS: S1-S2 clear, no murmur rubs or gallops Chest: clear to auscultation bilaterally, no wheezing, rales or rhonchi Abdomen: soft nontender, nondistended, normal bowel sounds  Extremities: no cyanosis, clubbing or edema noted bilaterally Neuro: Cranial nerves II-XII intact, no focal  neurological deficits  Lab Results: Basic Metabolic Panel:  Recent Labs Lab 06/23/14 2214 06/24/14 0802  NA 138 140  K 3.5 3.2*  CL 99 103  CO2 28 28  GLUCOSE 66* 110*  BUN 9 10  CREATININE 1.43* 1.37*  CALCIUM 8.8 8.7   Liver Function Tests:  Recent Labs Lab 06/23/14 2214 06/24/14 0802  AST 118* 114*  ALT 70* 67*  ALKPHOS 183* 138*  BILITOT 2.0*  2.2*  PROT 8.4* 8.2  ALBUMIN 3.1* 2.9*   No results for input(s): LIPASE, AMYLASE in the last 168 hours. No results for input(s): AMMONIA in the last 168 hours. CBC:  Recent Labs Lab 06/23/14 2214 06/24/14 0802  WBC 8.0 8.9  NEUTROABS 4.3 7.5  HGB 13.1 13.5  HCT 38.0* 39.0  MCV 86.6 88.0  PLT 103* 94*   Cardiac Enzymes:  Recent Labs Lab 06/24/14 0802  TROPONINI 0.04*   BNP: Invalid input(s): POCBNP CBG: No results for input(s): GLUCAP in the last 168 hours.   Micro Results: No results found for this or any previous visit (from the past 240 hour(s)).  Studies/Results: Dg Chest 2 View  05/26/2014   CLINICAL DATA:  Acute shortness of breath  EXAM: CHEST  2 VIEW  COMPARISON:  None.  FINDINGS: Mild cardiomegaly without CHF, pneumonia, collapse or consolidation. No effusion or pneumothorax. Trachea midline. Minor atherosclerosis of the aorta.  IMPRESSION: Cardiomegaly without acute process   Electronically Signed   By: Daryll Brod M.D.   On: 05/26/2014 13:22   Mr Jeri Cos UQ Contrast  06/24/2014   CLINICAL DATA:  Woke up with LEFT eye blindness 2-3 days ago, blurred vision, headaches, pressure behind eyes. History of hypertension and stroke.  EXAM: MRI HEAD WITHOUT CONTRAST  TECHNIQUE: Multiplanar, multiecho pulse sequences of the brain and surrounding structures were obtained without intravenous contrast.  COMPARISON:  None.  FINDINGS: Mild moderately motion degraded examination. No reduced diffusion to suggest acute ischemia. Punctate foci of susceptibility artifact within the deep gray nuclei. No midline shift, mass effect or mass lesions.  Disproportionate symmetric cerebral volume loss for age. LEFT greater than RIGHT confluent T2 hyperintense signal within the thalamus, relatively sparing of the thalamus. Additional punctate T2 hyperintensities in the bilateral basal ganglia suggestive lacunar infarcts and/or perivascular spaces. No abnormal parenchymal enhancement. Patchy T2  hyperintensities in the pons, moderate white matter changes suggest chronic small vessel ischemic disease.  No abnormal extra-axial fluid collections, extra-axial masses, abnormal enhancement. Dolichoectatic vascular flow voids at the skull base.  Equivocal findings for T2 bright signal within the LEFT optic nerve, with equivocal superimposed enhancement on motion degraded coronal T1 post contrast sequences.  Less sphenoid sinusitis. The mastoid air cells are well aerated. No abnormal sellar expansion. No cerebellar tonsillar ectopia.  IMPRESSION: Mild to moderate motion degraded examination. Suspected edema and enhancement LEFT optic nerve, limited assessment, this may reflect optic neuritis, no evidence of ischemia.  Bilateral abnormal thalamic signal, this can be seen with hypertensive encephalopathy, metabolic disturbance, less likely Wernicke encephalopathy. Recommend followup.  Dolichoectatic Intracranial vessels suggest sequelae of chronic hypertension. Moderate white matter changes may reflect chronic small vessel ischemic disease.  Cerebellar volume loss, advanced for age.  Moderate white matter changes suggest chronic small vessel ischemic disease.  Acute findings discussed with and reconfirmed by Dr.JENNIFER PIEPENBRINK on 06/24/2014 at 1:47 am.   Electronically Signed   By: Elon Alas   On: 06/24/2014 01:50   Dg Chest Port 1 View  06/24/2014   CLINICAL  DATA:  Cough, shortness of breath, congestion and chest discomfort.  EXAM: PORTABLE CHEST - 1 VIEW  COMPARISON:  05/26/2014  FINDINGS: Mild cardiomegaly is unchanged allowing for differences in technique. Pulmonary vasculature is normal. No consolidation, pleural effusion, or pneumothorax. No acute osseous abnormalities are seen.  IMPRESSION: Stable cardiomegaly.  No acute or localizing pulmonary process.   Electronically Signed   By: Jeb Levering M.D.   On: 06/24/2014 06:11    Medications: Scheduled Meds: . amLODipine  5 mg Oral Daily    . aspirin EC  325 mg Oral Daily  . potassium chloride  40 mEq Oral Once      LOS: 0 days   RAI,RIPUDEEP M.D. [facility] 06/24/2014, 10:58 AM Pager: [phone]  If 7PM-7AM, please contact night-coverage [email or web address] Password [email or web address]

## 2014-06-24 NOTE — H&P (Signed)
[facility] History and Physical  John [name] WUJ:[id number] DOB: 1955-08-22 DOA: 06/23/2014  Referring physician: ER physician. PCP: No PCP Per Patient community clinic.  Chief Complaint: Left eye vision loss.  HPI: John [name] is a 59 y.o. male with history of hypertension chronic low back pain and polysubstance abuse who was referred to the ER by ophthalmologist after patient was complaining of left eye vision loss. Patient states he had sudden loss of vision since Monday 5 days ago. Patient denies any loss of function of the upper or lower extremities denies any headache fever chills nausea vomiting dizziness or loss of consciousness. In the ER patient had MRI of the brain which shows possible left optic neuritis and also bilateral abnormal thalamic signal. On-call neurologist Dr. Aram Beecham has seen the patient in consult and admitted for further management. In addition patient states that over the last 2 weeks patient has been having some shortness of breath denies any chest pain productive cough fever chills.  Review of Systems: As presented in the history of presenting illness, rest negative.  Past Medical History  Diagnosis Date  . Hypertension   . Chronic back pain   . Stroke    History reviewed. No pertinent past surgical history. Social History:  reports that he has been smoking Cigarettes.  He has been smoking about 0.30 packs per day. He does not have any smokeless tobacco history on file. He reports that he drinks alcohol. He reports that he uses illicit drugs (Cocaine and Marijuana). Where does patient live at home. Can patient participate in ADLs? Yes.  Allergies  Allergen Reactions  . Penicillins     Convulsions    Family History: History reviewed. No pertinent family history.    Prior to Admission medications   Medication Sig Start Date End Date Taking? Authorizing Provider  ibuprofen (ADVIL,MOTRIN) 200 MG tablet Take 600 mg by mouth every 6 (six)  hours as needed for moderate pain.   Yes Historical Provider, MD  lisinopril-hydrochlorothiazide (PRINZIDE,ZESTORETIC) 20-25 MG per tablet Take 1 tablet by mouth daily. 03/03/13  Yes Debbe Odea, MD  meloxicam (MOBIC) 7.5 MG tablet Take 1 tablet (7.5 mg total) by mouth daily. 04/20/13  Yes Larene Pickett, PA-C  doxycycline (VIBRAMYCIN) 100 MG capsule Take 1 capsule (100 mg total) by mouth 2 (two) times daily. Patient not taking: Reported on 06/23/2014 05/26/14   Jasper Riling. Pickering, MD  methocarbamol (ROBAXIN) 500 MG tablet Take 1 tablet (500 mg total) by mouth 4 (four) times daily. Patient not taking: Reported on 06/23/2014 04/12/13   Liliane Bade, NP  methocarbamol (ROBAXIN) 500 MG tablet Take 1 tablet (500 mg total) by mouth 2 (two) times daily as needed. Patient not taking: Reported on 06/23/2014 04/20/13   Larene Pickett, PA-C  oxyCODONE-acetaminophen (ROXICET) 5-325 MG per tablet Take 1 tablet by mouth every 6 (six) hours as needed for moderate pain or severe pain. Patient not taking: Reported on 06/23/2014 04/12/13   Liliane Bade, NP    Physical Exam: Filed Vitals:   06/24/14 0315 06/24/14 0330 06/24/14 0500 06/24/14 0531  BP: 150/85 143/77  180/100  Pulse: 91 81  84  Temp:    98.4 F (36.9 C)  TempSrc:    Oral  Resp: 31 20  22   Height:   5\' 10"  (1.778 m)   Weight:   113.399 kg (250 lb)   SpO2: 91% 90%  96%     General:  Well-developed and nourished.  Eyes: Patient has poor  vision on the left eye with minimal pupil reaction.  ENT: No discharge from the ears eyes nose and mouth.  Neck: No mass felt.  Cardiovascular: S1-S2.  Respiratory: No rhonchi or crepitations.  Abdomen: Soft nontender bowel sounds present.  Skin: No rash.  Musculoskeletal: No edema.  Psychiatric: Appears normal.  Neurologic: Alert awake oriented to time place and person. Moves all extremities.  Labs on Admission:  Basic Metabolic Panel:  Recent Labs Lab 06/23/14 1734 06/23/14 2214   NA 142 138  K 3.3* 3.5  CL 101 99  CO2  --  28  GLUCOSE 105* 66*  BUN 12 9  CREATININE 1.20 1.43*  CALCIUM  --  8.8   Liver Function Tests:  Recent Labs Lab 06/23/14 2214  AST 118*  ALT 70*  ALKPHOS 183*  BILITOT 2.0*  PROT 8.4*  ALBUMIN 3.1*   No results for input(s): LIPASE, AMYLASE in the last 168 hours. No results for input(s): AMMONIA in the last 168 hours. CBC:  Recent Labs Lab 06/23/14 1726 06/23/14 1734 06/23/14 2214  WBC 7.0  --  8.0  NEUTROABS 3.4  --  4.3  HGB 13.9 15.6 13.1  HCT 39.8 46.0 38.0*  MCV 87.9  --  86.6  PLT 101*  --  103*   Cardiac Enzymes: No results for input(s): CKTOTAL, CKMB, CKMBINDEX, TROPONINI in the last 168 hours.  BNP (last 3 results) No results for input(s): BNP in the last 8760 hours.  ProBNP (last 3 results) No results for input(s): PROBNP in the last 8760 hours.  CBG: No results for input(s): GLUCAP in the last 168 hours.  Radiological Exams on Admission: Mr Kizzie Fantasia Contrast  06/24/2014   CLINICAL DATA:  Woke up with LEFT eye blindness 2-3 days ago, blurred vision, headaches, pressure behind eyes. History of hypertension and stroke.  EXAM: MRI HEAD WITHOUT CONTRAST  TECHNIQUE: Multiplanar, multiecho pulse sequences of the brain and surrounding structures were obtained without intravenous contrast.  COMPARISON:  None.  FINDINGS: Mild moderately motion degraded examination. No reduced diffusion to suggest acute ischemia. Punctate foci of susceptibility artifact within the deep gray nuclei. No midline shift, mass effect or mass lesions.  Disproportionate symmetric cerebral volume loss for age. LEFT greater than RIGHT confluent T2 hyperintense signal within the thalamus, relatively sparing of the thalamus. Additional punctate T2 hyperintensities in the bilateral basal ganglia suggestive lacunar infarcts and/or perivascular spaces. No abnormal parenchymal enhancement. Patchy T2 hyperintensities in the pons, moderate white matter  changes suggest chronic small vessel ischemic disease.  No abnormal extra-axial fluid collections, extra-axial masses, abnormal enhancement. Dolichoectatic vascular flow voids at the skull base.  Equivocal findings for T2 bright signal within the LEFT optic nerve, with equivocal superimposed enhancement on motion degraded coronal T1 post contrast sequences.  Less sphenoid sinusitis. The mastoid air cells are well aerated. No abnormal sellar expansion. No cerebellar tonsillar ectopia.  IMPRESSION: Mild to moderate motion degraded examination. Suspected edema and enhancement LEFT optic nerve, limited assessment, this may reflect optic neuritis, no evidence of ischemia.  Bilateral abnormal thalamic signal, this can be seen with hypertensive encephalopathy, metabolic disturbance, less likely Wernicke encephalopathy. Recommend followup.  Dolichoectatic Intracranial vessels suggest sequelae of chronic hypertension. Moderate white matter changes may reflect chronic small vessel ischemic disease.  Cerebellar volume loss, advanced for age.  Moderate white matter changes suggest chronic small vessel ischemic disease.  Acute findings discussed with and reconfirmed by Dr.JENNIFER PIEPENBRINK on 06/24/2014 at 1:47 am.   Electronically Signed  By: Elon Alas   On: 06/24/2014 01:50   Dg Chest Port 1 View  06/24/2014   CLINICAL DATA:  Cough, shortness of breath, congestion and chest discomfort.  EXAM: PORTABLE CHEST - 1 VIEW  COMPARISON:  05/26/2014  FINDINGS: Mild cardiomegaly is unchanged allowing for differences in technique. Pulmonary vasculature is normal. No consolidation, pleural effusion, or pneumothorax. No acute osseous abnormalities are seen.  IMPRESSION: Stable cardiomegaly.  No acute or localizing pulmonary process.   Electronically Signed   By: Jeb Levering M.D.   On: 06/24/2014 06:11    EKG: Independently reviewed. Normal sinus rhythm with LVH.  Assessment/Plan Active Problems:   Vision loss of  left eye   Accelerated hypertension   Elevated LFTs   Polysubstance abuse   Acute renal failure   SOB (shortness of breath)   Vision loss, left eye   1. Left eye vision loss - appreciate neurology consult and I did discuss with Dr. Rosendo Gros the on-call neurologist. Differential includes optic neuritis. MRI of the orbits has been ordered and sedimentation rate and CRP are pending. Further recommendations per neurologist. 2. Hypertensive urgency - since patient has renal failure I have held the lisinopril and HCTZ. I have placed patient on when necessary IV hydralazine along with Norvasc. Closely follow blood pressure trends patient may need additional medications. 3. Shortness of breath - maybe secondary to uncontrolled blood pressure. Chest x-ray is unremarkable. But does show cardiomegaly and EKG shows LVH. Check BNP d-dimer troponin and 2-D echo. Control blood pressure. 4. Renal failure probably acute - baseline creatinine not known. May be secondary to uncontrolled blood pressure. For now we are holding off lisinopril and HCTZ and gently hydrating. Urine analysis is unremarkable. 5. Polysubstance abuse - patient is positive for cocaine and marijuana and also uses tobacco abuse and states he occasionally drinks alcohol. Social work consult. 6. Elevated LFTs - patient's abdomen appears benign. Check acute hepatitis panel and sonogram of the abdomen and follow LFTs closely. 7. Thrombocytopenia - closely follow CBC. Patient is afebrile at this time. Check LDH for any hemolytic process.   DVT Prophylaxis SCDs. May place patient on Lovenox if platelets are not decreasing and if neurologist is not planning any lumbar puncture.  Code Status: Full code.  Family Communication: None.  Disposition Plan: Admit to inpatient.    Romond Pipkins N. [facility] Pager [phone].  If 7PM-7AM, please contact night-coverage [email or web address] Password [facility] 06/24/2014, 6:54 AM

## 2014-06-24 NOTE — Progress Notes (Deleted)
Pt refused CPAP at this time.

## 2014-06-24 NOTE — ED Notes (Signed)
Report attempted 

## 2014-06-25 DIAGNOSIS — R945 Abnormal results of liver function studies: Secondary | ICD-10-CM | POA: Insufficient documentation

## 2014-06-25 DIAGNOSIS — R7989 Other specified abnormal findings of blood chemistry: Secondary | ICD-10-CM | POA: Insufficient documentation

## 2014-06-25 DIAGNOSIS — E876 Hypokalemia: Secondary | ICD-10-CM

## 2014-06-25 DIAGNOSIS — F1910 Other psychoactive substance abuse, uncomplicated: Secondary | ICD-10-CM

## 2014-06-25 DIAGNOSIS — R0600 Dyspnea, unspecified: Secondary | ICD-10-CM

## 2014-06-25 LAB — HEPATIC FUNCTION PANEL
ALBUMIN: 2.5 g/dL — AB (ref 3.5–5.2)
ALK PHOS: 109 U/L (ref 39–117)
ALT: 62 U/L — AB (ref 0–53)
AST: 114 U/L — ABNORMAL HIGH (ref 0–37)
BILIRUBIN DIRECT: 0.9 mg/dL — AB (ref 0.0–0.5)
Indirect Bilirubin: 0.9 mg/dL (ref 0.3–0.9)
Total Bilirubin: 1.8 mg/dL — ABNORMAL HIGH (ref 0.3–1.2)
Total Protein: 7.1 g/dL (ref 6.0–8.3)

## 2014-06-25 LAB — HEPATITIS PANEL, ACUTE
HCV Ab: REACTIVE — AB
HEP B C IGM: NONREACTIVE
HEP B S AG: NEGATIVE
Hep A IgM: NONREACTIVE

## 2014-06-25 LAB — BASIC METABOLIC PANEL
ANION GAP: 10 (ref 5–15)
BUN: 10 mg/dL (ref 6–23)
CO2: 27 mmol/L (ref 19–32)
Calcium: 8.2 mg/dL — ABNORMAL LOW (ref 8.4–10.5)
Chloride: 101 mmol/L (ref 96–112)
Creatinine, Ser: 1.42 mg/dL — ABNORMAL HIGH (ref 0.50–1.35)
GFR calc non Af Amer: 53 mL/min — ABNORMAL LOW (ref >90)
GFR, EST AFRICAN AMERICAN: 62 mL/min — AB (ref >90)
Glucose, Bld: 90 mg/dL (ref 70–99)
Potassium: 3.4 mmol/L — ABNORMAL LOW (ref 3.5–5.1)
Sodium: 138 mmol/L (ref 135–145)

## 2014-06-25 LAB — CBC
HCT: 34.5 % — ABNORMAL LOW (ref 39.0–52.0)
HEMOGLOBIN: 11.6 g/dL — AB (ref 13.0–17.0)
MCH: 30 pg (ref 26.0–34.0)
MCHC: 33.6 g/dL (ref 30.0–36.0)
MCV: 89.1 fL (ref 78.0–100.0)
Platelets: 95 10*3/uL — ABNORMAL LOW (ref 150–400)
RBC: 3.87 MIL/uL — ABNORMAL LOW (ref 4.22–5.81)
RDW: 14.3 % (ref 11.5–15.5)
WBC: 7.1 10*3/uL (ref 4.0–10.5)

## 2014-06-25 LAB — HEMOGLOBIN A1C
HEMOGLOBIN A1C: 5.2 % (ref 4.8–5.6)
Mean Plasma Glucose: 103 mg/dL

## 2014-06-25 MED ORDER — HYDRALAZINE HCL 25 MG PO TABS
25.0000 mg | ORAL_TABLET | Freq: Three times a day (TID) | ORAL | Status: DC
  Administered 2014-06-25 – 2014-06-26 (×3): 25 mg via ORAL
  Filled 2014-06-25 (×3): qty 1

## 2014-06-25 MED ORDER — SODIUM CHLORIDE 0.9 % IV SOLN
INTRAVENOUS | Status: DC
  Administered 2014-06-25: 11:00:00 via INTRAVENOUS

## 2014-06-25 MED ORDER — PANTOPRAZOLE SODIUM 40 MG PO TBEC
40.0000 mg | DELAYED_RELEASE_TABLET | Freq: Every day | ORAL | Status: DC
Start: 2014-06-25 — End: 2014-06-29
  Administered 2014-06-25 – 2014-06-29 (×5): 40 mg via ORAL
  Filled 2014-06-25 (×4): qty 1

## 2014-06-25 MED ORDER — TRAMADOL HCL 50 MG PO TABS
50.0000 mg | ORAL_TABLET | Freq: Two times a day (BID) | ORAL | Status: DC | PRN
  Administered 2014-06-25 – 2014-06-26 (×2): 50 mg via ORAL
  Filled 2014-06-25 (×2): qty 1

## 2014-06-25 NOTE — Progress Notes (Signed)
VASCULAR LAB PRELIMINARY  PRELIMINARY  PRELIMINARY  PRELIMINARY  Carotid Dopplers completed.    Preliminary report:  1-39% ICA stenosis.  Vertebral artery flow is antegrade.   Myrikal Messmer, RVT 06/25/2014, 3:37 PM

## 2014-06-25 NOTE — Progress Notes (Signed)
Subjective: Patient awake and alert.  Working with therapy.  He reports that he feels that his vision is some better today.  Has received one dose of Solumedrol.     Objective: Current vital signs: BP 183/95 mmHg  Pulse 97  Temp(Src) 97.9 F (36.6 C) (Oral)  Resp 22  Ht $R'5\' 10"'Wj$  (1.778 m)  Wt 113.399 kg (250 lb)  BMI 35.87 kg/m2  SpO2 96% Vital signs in last 24 hours: Temp:  [97.9 F (36.6 C)-98.4 F (36.9 C)] 97.9 F (36.6 C) (02/27 0630) Pulse Rate:  [83-97] 97 (02/27 0630) Resp:  [22-24] 22 (02/27 0630) BP: (149-184)/(85-102) 183/95 mmHg (02/27 0630) SpO2:  [94 %-98 %] 96 % (02/27 0630)  Intake/Output from previous day: 02/26 0701 - 02/27 0700 In: -  Out: 200 [Urine:200] Intake/Output this shift: Total I/O In: 480 [P.O.:480] Out: -  Nutritional status: Diet Heart  Neurologic Exam: Mental Status: Alert, oriented, thought content appropriate. Speech fluent without evidence of aphasia. Able to follow 3 step commands without difficulty. Cranial Nerves: II: Discs flat bilaterally; Only able to see shadows with the left eye.  20/200 in the right eye.  Pupils equal, round, reactive to light and accommodation III,IV, VI: ptosis not present, extra-ocular motions intact bilaterally V,VII: smile symmetric, facial light touch sensation normal bilaterally VIII: hearing normal bilaterally IX,X: gag reflex present XI: bilateral shoulder shrug XII: midline tongue extension without atrophy or fasciculations Motor: Right :Upper extremity 5/5Left: Upper extremity 5/5 Lower extremity 5/5Lower extremity 5/5 Tone and bulk:normal tone throughout; no atrophy noted Sensory: Pinprick and light touch intact throughout, bilaterally Deep Tendon Reflexes:  2+ throughout Plantars: Right: downgoingLeft: downgoing Cerebellar: normal  finger-to-nose, normal heel-to-shin testing bilaterally  Lab Results: Basic Metabolic Panel:  Recent Labs Lab 06/23/14 1734 06/23/14 2214 06/24/14 0802 06/25/14 0539  NA 142 138 140 138  K 3.3* 3.5 3.2* 3.4*  CL 101 99 103 101  CO2  --  $R'28 28 27  'nn$ GLUCOSE 105* 66* 110* 90  BUN $Re'12 9 10 10  'Lvs$ CREATININE 1.20 1.43* 1.37* 1.42*  CALCIUM  --  8.8 8.7 8.2*    Liver Function Tests:  Recent Labs Lab 06/23/14 2214 06/24/14 0802  AST 118* 114*  ALT 70* 67*  ALKPHOS 183* 138*  BILITOT 2.0* 2.2*  PROT 8.4* 8.2  ALBUMIN 3.1* 2.9*   No results for input(s): LIPASE, AMYLASE in the last 168 hours.  Recent Labs Lab 06/24/14 1717  AMMONIA 31    CBC:  Recent Labs Lab 06/23/14 1726 06/23/14 1734 06/23/14 2214 06/24/14 0802 06/25/14 0539  WBC 7.0  --  8.0 8.9 7.1  NEUTROABS 3.4  --  4.3 7.5  --   HGB 13.9 15.6 13.1 13.5 11.6*  HCT 39.8 46.0 38.0* 39.0 34.5*  MCV 87.9  --  86.6 88.0 89.1  PLT 101*  --  103* 94* 95*    Cardiac Enzymes:  Recent Labs Lab 06/24/14 0802 06/24/14 1225 06/24/14 1717 06/24/14 2242  TROPONINI 0.04* 0.03 0.04* 0.06*    Lipid Panel:  Recent Labs Lab 06/24/14 1225  CHOL 111  TRIG 70  HDL 30*  CHOLHDL 3.7  VLDL 14  LDLCALC 67    CBG: No results for input(s): GLUCAP in the last 168 hours.  Microbiology: No results found for this or any previous visit.  Coagulation Studies:  Recent Labs  06/23/14 2214  LABPROT 14.6  INR 1.13    Imaging: Mr Jeri Cos UV Contrast  06/24/2014   CLINICAL DATA:  Woke  up with LEFT eye blindness 2-3 days ago, blurred vision, headaches, pressure behind eyes. History of hypertension and stroke.  EXAM: MRI HEAD WITHOUT CONTRAST  TECHNIQUE: Multiplanar, multiecho pulse sequences of the brain and surrounding structures were obtained without intravenous contrast.  COMPARISON:  None.  FINDINGS: Mild moderately motion degraded examination. No reduced diffusion to suggest acute ischemia. Punctate foci of  susceptibility artifact within the deep gray nuclei. No midline shift, mass effect or mass lesions.  Disproportionate symmetric cerebral volume loss for age. LEFT greater than RIGHT confluent T2 hyperintense signal within the thalamus, relatively sparing of the thalamus. Additional punctate T2 hyperintensities in the bilateral basal ganglia suggestive lacunar infarcts and/or perivascular spaces. No abnormal parenchymal enhancement. Patchy T2 hyperintensities in the pons, moderate white matter changes suggest chronic small vessel ischemic disease.  No abnormal extra-axial fluid collections, extra-axial masses, abnormal enhancement. Dolichoectatic vascular flow voids at the skull base.  Equivocal findings for T2 bright signal within the LEFT optic nerve, with equivocal superimposed enhancement on motion degraded coronal T1 post contrast sequences.  Less sphenoid sinusitis. The mastoid air cells are well aerated. No abnormal sellar expansion. No cerebellar tonsillar ectopia.  IMPRESSION: Mild to moderate motion degraded examination. Suspected edema and enhancement LEFT optic nerve, limited assessment, this may reflect optic neuritis, no evidence of ischemia.  Bilateral abnormal thalamic signal, this can be seen with hypertensive encephalopathy, metabolic disturbance, less likely Wernicke encephalopathy. Recommend followup.  Dolichoectatic Intracranial vessels suggest sequelae of chronic hypertension. Moderate white matter changes may reflect chronic small vessel ischemic disease.  Cerebellar volume loss, advanced for age.  Moderate white matter changes suggest chronic small vessel ischemic disease.  Acute findings discussed with and reconfirmed by Dr.JENNIFER PIEPENBRINK on 06/24/2014 at 1:47 am.   Electronically Signed   By: Elon Alas   On: 06/24/2014 01:50   US Abdomen Complete  06/24/2014   CLINICAL DATA:  Abnormal LFTs  EXAM: ULTRASOUND ABDOMEN COMPLETE  COMPARISON:  None.  FINDINGS: Gallbladder: Layering  gallbladder sludge. No gallbladder wall thickening or pericholecystic fluid. Negative sonographic Murphy's sign.  Common bile duct: Diameter: 5 mm  Liver: Hyperechoic hepatic parenchyma with coarse echogenicity and a nodular hepatic contour. No focal hepatic lesion is seen.  IVC: No abnormality visualized.  Pancreas: Not visualized due to overlying bowel gas.  Spleen: Enlarged, measuring 16.2 x 16.9 x 6.1 cm (calculated volume 835 mL.  Right Kidney: Length: 11.9 cm.  No mass or hydronephrosis.  Left Kidney: Length: 13.4 cm.  No mass or hydronephrosis.  Abdominal aorta: No aneurysm visualized.  Other findings: None.  IMPRESSION: Suspected cirrhosis with possible superimposed hepatic steatosis.  Splenomegaly.  Gallbladder sludge.   Electronically Signed   By: Julian Hy M.D.   On: 06/24/2014 23:09   Dg Chest Port 1 View  06/24/2014   CLINICAL DATA:  Cough, shortness of breath, congestion and chest discomfort.  EXAM: PORTABLE CHEST - 1 VIEW  COMPARISON:  05/26/2014  FINDINGS: Mild cardiomegaly is unchanged allowing for differences in technique. Pulmonary vasculature is normal. No consolidation, pleural effusion, or pneumothorax. No acute osseous abnormalities are seen.  IMPRESSION: Stable cardiomegaly.  No acute or localizing pulmonary process.   Electronically Signed   By: Jeb Levering M.D.   On: 06/24/2014 06:11    Medications:  I have reviewed the patient's current medications. Scheduled: . amLODipine  10 mg Oral Daily  . aspirin EC  325 mg Oral Daily  . folic acid  1 mg Oral Daily  . hydrALAZINE  25  mg Oral 3 times per day  . methylPREDNISolone (SOLU-MEDROL) injection  1,000 mg Intravenous Daily  . multivitamin with minerals  1 tablet Oral Daily  . pantoprazole (PROTONIX) IV  40 mg Intravenous Q24H  . potassium chloride  40 mEq Oral Once  . thiamine  100 mg Oral Daily   Or  . thiamine  100 mg Intravenous Daily    Assessment/Plan: Vision remains quite poor.  Solumedrol initiated.   Concern is for a toxic etiology with bilateral thalamic abnormalities on imaging.  Ammonia 31.  ESR 19.  Tox screens are pending.    Recommendations: 1.  Continue Solumedrol 2.  Will continue to follow up lab results   LOS: 1 day   Alexis Goodell, MD [facility] [phone] 06/25/2014  9:53 AM

## 2014-06-25 NOTE — Evaluation (Signed)
Physical Therapy Evaluation Patient Details Name: John [name] MRN: [id number] DOB: 07-20-1955 Today's Date: 06/25/2014   History of Present Illness  Pt is a 59 y.o. male with history of hypertension, chronic low back pain, and polysubstance abuse who was referred to the ER by ophthalmologist after patient was complaining of left eye vision loss. Patient states he had sudden loss of vision since Monday 5 days ago. Patient denies any loss of function of the upper or lower extremities. In the ER patient had MRI of the brain which shows possible left optic neuritis and also bilateral abnormal thalamic signal.   Clinical Impression  Pt admitted with above diagnosis. Pt currently with functional limitations due to the deficits listed below (see PT Problem List). At the time of PT eval pt was able to perform transfers and ambulation with supervision and RW for support. Pt reports that other than his vision he does not feel any physical changes - strength 5/5 in quads/hams and 4+/5 in hip flexors. All mobility very slow and guarded due to vision loss, and recommend continued use of RW at this time for safety. Pt will benefit from skilled PT to increase their independence and safety with mobility to allow discharge to the venue listed below.       Follow Up Recommendations No PT follow up    Equipment Recommendations  Rolling walker with 5" wheels    Recommendations for Other Services       Precautions / Restrictions Precautions Precautions: Fall Precaution Comments: Decreased vision in L eye Restrictions Weight Bearing Restrictions: No      Mobility  Bed Mobility Overal bed mobility: Needs Assistance Bed Mobility: Supine to Sit;Sit to Supine     Supine to sit: Supervision Sit to supine: Supervision   General bed mobility comments: Supervision for safety. Pt was able to transition to/from EOB without assist.   Transfers Overall transfer level: Needs assistance Equipment used:  Rolling walker (2 wheeled) Transfers: Sit to/from [facility] Sit to Stand: Supervision Stand pivot transfers: Supervision       General transfer comment: Supervision for safety. Pt was cued for hand placement on seated surface for safety.   Ambulation/Gait Ambulation/Gait assistance: Min guard Ambulation Distance (Feet): 125 Feet Assistive device: Rolling walker (2 wheeled) Gait Pattern/deviations: Step-through pattern;Decreased stride length;Trunk flexed Gait velocity: Decreased Gait velocity interpretation: Below normal speed for age/gender General Gait Details: Pt ambulating slow and guarded. Pt appears to be constantly scanning the halls (moving head to scan, not just eyes). Pt able to avoid obstacles in hall well. VC's for improved posture and walker placement close to pt's body.   Stairs            Wheelchair Mobility    Modified Rankin (Stroke Patients Only)       Balance Overall balance assessment: Needs assistance Sitting-balance support: Feet supported;No upper extremity supported Sitting balance-Leahy Scale: Fair     Standing balance support: During functional activity;No upper extremity supported Standing balance-Leahy Scale: Fair                               Pertinent Vitals/Pain Pain Assessment: Faces Faces Pain Scale: Hurts a little bit Pain Location: L eye Pain Descriptors / Indicators: Headache Pain Intervention(s): Limited activity within patient's tolerance;Monitored during session    Home Living Family/patient expects to be discharged to:: Private residence Living Arrangements: Other relatives Available Help at Discharge: Family;Available PRN/intermittently Type of Home:  House Home Access: Level entry     Home Layout: One level Home Equipment: Cane - single point      Prior Function Level of Independence: Independent         Comments: Pt states he occasionally took his "walking stick" out with him  for extra security while walking. States he has had 1 fall in the past year in which pt fell down the stairs while playing with his great-niece.      Hand Dominance   Dominant Hand: Right    Extremity/Trunk Assessment   Upper Extremity Assessment: Defer to OT evaluation;Overall WFL for tasks assessed           Lower Extremity Assessment: Overall WFL for tasks assessed      Cervical / Trunk Assessment: Normal  Communication   Communication: No difficulties  Cognition Arousal/Alertness: Awake/alert Behavior During Therapy: WFL for tasks assessed/performed Overall Cognitive Status: Within Functional Limits for tasks assessed                      General Comments      Exercises        Assessment/Plan    PT Assessment Patient needs continued PT services  PT Diagnosis Difficulty walking   PT Problem List Decreased strength;Decreased range of motion;Decreased activity tolerance;Decreased balance;Decreased mobility;Decreased knowledge of use of DME;Decreased safety awareness;Decreased knowledge of precautions  PT Treatment Interventions DME instruction;Stair training;Gait training;Functional mobility training;Therapeutic activities;Therapeutic exercise;Neuromuscular re-education;Patient/family education   PT Goals (Current goals can be found in the Care Plan section) Acute Rehab PT Goals Patient Stated Goal: "get back to normal" PT Goal Formulation: With patient Time For Goal Achievement: 07/02/14 Potential to Achieve Goals: Good    Frequency Min 3X/week   Barriers to discharge        Co-evaluation               End of Session Equipment Utilized During Treatment: Gait belt Activity Tolerance: Patient tolerated treatment well Patient left: in bed;with call bell/phone within reach;Other (comment) (ECHO present in room) Nurse Communication: Mobility status         Time: 7048-8891 PT Time Calculation (min) (ACUTE ONLY): 32 min   Charges:   PT  Evaluation $Initial PT Evaluation Tier I: 1 Procedure PT Treatments $Gait Training: 8-22 mins   PT G Codes:        Rolinda Roan 2014/07/05, 10:08 AM   Rolinda Roan, PT, DPT Acute Rehabilitation Services Pager: [phone]

## 2014-06-25 NOTE — Progress Notes (Signed)
  Echocardiogram 2D Echocardiogram has been performed.  Avanell Shackleton M 06/25/2014, 10:20 AM

## 2014-06-25 NOTE — Progress Notes (Signed)
Patient ID: John [name]  male  QTM:[id number]    DOB: 1956-02-24    DOA: 06/23/2014  PCP: No PCP Per Patient   Brief history of present illness  John [name] is a 59 y.o. male with history of hypertension chronic low back pain and polysubstance abuse who was referred to the ER by ophthalmologist after patient was complaining of left eye vision loss. Patient reported that he had sudden loss of vision since Monday 5 days ago. Patient denieD any loss of function of the upper or lower extremities denies any headache fever chills nausea vomiting dizziness or loss of consciousness. In the ER patient had MRI of the brain which shows possible left optic neuritis and also bilateral abnormal thalamic signal. Patient was seen by neurology on call, recommended further management.  In addition patient reported that over the last 2 weeks he had having some shortness of breath, denied any chest pain productive cough fever chills.   Assessment/Plan: Principal Problem:   Vision loss, left eye: ? Optic neuritis, per patient mild improvement - MRI of the brain showed suspected edema and enhancement left optic nerve, limited assessment, may reflect optic neuritis, no evidence of ischemia. Bilateral abnormal thalamic signal can be seen with hypertensive encephalopathy metabolic disturbance less likely Wernicke's encephalopathy. Chronic small vessel ischemic disease. 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  - hepatitis panel positive for HCV Ab    Polysubstance abuse - UDS positive for cocaine and marijuana, the patient reports that he did cocaine a week ago. Counseled strongly on cocaine cessation     Acute renal failure - Continue to hold his lisinopril, HCTZ, ibuprofen, Mobic  - Continue gentle hydration for now     SOB (shortness of breath) with elevated troponins likely due to cocaine use - BNP elevated at 1168, troponin slightly elevated 0.04 -  2-D echocardiogram pending    Thrombocytopenia with elevated LFTs -  monitor counts closely   alcohol use - Place on CIWA protocol, thiamine, folic acid     Hypokalemia - Replaced    Elevated troponin: Possibly due to cocaine use and accelerated hypertension -No chest pain currently, placed on aspirin, no beta blockers secondary to cocaine use, no ACE inhibitor secondary to acute renal insufficiency - Check lipid panel although no statins due to transaminitis -  Check 2-D echo   DVT Prophylaxis: SCD's   Code Status:full code  Family Communication:   Disposition:   Consultants:  Neuro  Procedures:  MRI brain  Antibiotics:  None    Subjective: Feels vision is slightly improving today, no chest pain or shortness of breath, nausea or vomiting.  Objective: Weight change:   Intake/Output Summary (Last 24 hours) at 06/25/14 1029 Last data filed at 06/25/14 0840  Gross per 24 hour  Intake    480 ml  Output      0 ml  Net    480 ml   Blood pressure 183/95, pulse 97, temperature 97.9 F (36.6 C), temperature source Oral, resp. rate 22, height $RemoveBe'5\' 10"'[id number]$  (1.778 m), weight 113.399  kg (250 lb), SpO2 96 %.  Physical Exam: General: Alert and awake, oriented x3, not in any acute distress. CVS: S1-S2 clear, no murmur rubs or gallops Chest: clear to auscultation bilaterally Abdomen: soft nontender, nondistended, normal bowel sounds  Extremities: no c/c/e bilaterally   Lab Results: Basic Metabolic Panel:  Recent Labs Lab 06/24/14 0802 06/25/14 0539  NA 140  138  K 3.2* 3.4*  CL 103 101  CO2 28 27  GLUCOSE 110* 90  BUN 10 10  CREATININE 1.37* 1.42*  CALCIUM 8.7 8.2*   Liver Function Tests:  Recent Labs Lab 06/24/14 0802 06/25/14 0758  AST 114* 114*  ALT 67* 62*  ALKPHOS 138* 109  BILITOT 2.2* 1.8*  PROT 8.2 7.1  ALBUMIN 2.9* 2.5*   No results for input(s): LIPASE, AMYLASE in the last 168 hours.  Recent Labs Lab 06/24/14 1717  AMMONIA 31   CBC:  Recent Labs Lab 06/24/14 0802 06/25/14 0539  WBC 8.9 7.1  NEUTROABS 7.5  --   HGB 13.5 11.6*  HCT 39.0 34.5*  MCV 88.0 89.1  PLT 94* 95*   Cardiac Enzymes:  Recent Labs Lab 06/24/14 1225 06/24/14 1717 06/24/14 2242  TROPONINI 0.03 0.04* 0.06*   BNP: Invalid input(s): POCBNP CBG: No results for input(s): GLUCAP in the last 168 hours.   Micro Results: No results found for this or any previous visit (from the past 240 hour(s)).  Studies/Results: Dg Chest 2 View  05/26/2014   CLINICAL DATA:  Acute shortness of breath  EXAM: CHEST  2 VIEW  COMPARISON:  None.  FINDINGS: Mild cardiomegaly without CHF, pneumonia, collapse or consolidation. No effusion or pneumothorax. Trachea midline. Minor atherosclerosis of the aorta.  IMPRESSION: Cardiomegaly without acute process   Electronically Signed   By: Daryll Brod M.D.   On: 05/26/2014 13:22   Mr Jeri Cos TD Contrast  06/24/2014   CLINICAL DATA:  Woke up with LEFT eye blindness 2-3 days ago, blurred vision, headaches, pressure behind eyes. History of hypertension and stroke.  EXAM: MRI HEAD WITHOUT CONTRAST  TECHNIQUE: Multiplanar, multiecho pulse sequences of the brain and surrounding structures were obtained without intravenous contrast.  COMPARISON:  None.  FINDINGS: Mild moderately motion degraded examination. No reduced diffusion to suggest acute ischemia. Punctate foci of susceptibility artifact within the deep gray nuclei. No midline shift, mass effect or mass lesions.  Disproportionate symmetric cerebral volume loss for  age. LEFT greater than RIGHT confluent T2 hyperintense signal within the thalamus, relatively sparing of the thalamus. Additional punctate T2 hyperintensities in the bilateral basal ganglia suggestive lacunar infarcts and/or perivascular spaces. No abnormal parenchymal enhancement. Patchy T2 hyperintensities in the pons, moderate white matter changes suggest chronic small vessel ischemic disease.  No abnormal extra-axial fluid collections, extra-axial masses, abnormal enhancement. Dolichoectatic vascular flow voids at the skull base.  Equivocal findings for T2 bright signal within the LEFT optic nerve, with equivocal superimposed enhancement on motion degraded coronal T1 post contrast sequences.  Less sphenoid sinusitis. The mastoid air cells are well aerated. No abnormal sellar expansion. No cerebellar tonsillar ectopia.  IMPRESSION: Mild to moderate motion degraded examination. Suspected edema and enhancement LEFT optic nerve, limited assessment, this may reflect optic neuritis, no evidence of ischemia.  Bilateral abnormal thalamic signal, this can be seen with hypertensive encephalopathy, metabolic disturbance, less likely Wernicke encephalopathy. Recommend followup.  Dolichoectatic Intracranial vessels suggest sequelae of chronic hypertension. Moderate white matter changes may reflect chronic small vessel ischemic disease.  Cerebellar volume loss, advanced for age.  Moderate white matter changes suggest chronic small vessel ischemic disease.  Acute findings discussed with and reconfirmed by Dr.JENNIFER PIEPENBRINK on 06/24/2014 at 1:47 am.   Electronically Signed   By: Elon Alas   On: 06/24/2014 01:50   US Abdomen Complete  06/24/2014   CLINICAL DATA:  Abnormal LFTs  EXAM: ULTRASOUND ABDOMEN COMPLETE  COMPARISON:  None.  FINDINGS: Gallbladder: Layering gallbladder sludge. No gallbladder wall thickening or pericholecystic fluid. Negative sonographic Murphy's sign.  Common bile duct: Diameter: 5 mm   Liver: Hyperechoic hepatic parenchyma with coarse echogenicity and a nodular hepatic contour. No focal hepatic lesion is seen.  IVC: No abnormality visualized.  Pancreas: Not visualized due to overlying bowel gas.  Spleen: Enlarged, measuring 16.2 x 16.9 x 6.1 cm (calculated volume 835 mL.  Right Kidney: Length: 11.9 cm.  No mass or hydronephrosis.  Left Kidney: Length: 13.4 cm.  No mass or hydronephrosis.  Abdominal aorta: No aneurysm visualized.  Other findings: None.  IMPRESSION: Suspected cirrhosis with possible superimposed hepatic steatosis.  Splenomegaly.  Gallbladder sludge.   Electronically Signed   By: Julian Hy M.D.   On: 06/24/2014 23:09   Dg Chest Port 1 View  06/24/2014   CLINICAL DATA:  Cough, shortness of breath, congestion and chest discomfort.  EXAM: PORTABLE CHEST - 1 VIEW  COMPARISON:  05/26/2014  FINDINGS: Mild cardiomegaly is unchanged allowing for differences in technique. Pulmonary vasculature is normal. No consolidation, pleural effusion, or pneumothorax. No acute osseous abnormalities are seen.  IMPRESSION: Stable cardiomegaly.  No acute or localizing pulmonary process.   Electronically Signed   By: Jeb Levering M.D.   On: 06/24/2014 06:11    Medications: Scheduled Meds: . amLODipine  10 mg Oral Daily  . aspirin EC  325 mg Oral Daily  . folic acid  1 mg Oral Daily  . hydrALAZINE  25 mg Oral 3 times per day  . methylPREDNISolone (SOLU-MEDROL) injection  1,000 mg Intravenous Daily  . multivitamin with minerals  1 tablet Oral Daily  . pantoprazole (PROTONIX) IV  40 mg Intravenous Q24H  . potassium chloride  40 mEq Oral Once  . thiamine  100 mg Oral Daily   Or  . thiamine  100 mg Intravenous Daily      LOS: 1 day   John [name],John [name] M.D. [facility] 06/25/2014, 10:29 AM Pager: [phone]  If 7PM-7AM, please contact night-coverage [email or web address] Password [email or web address]

## 2014-06-26 ENCOUNTER — Inpatient Hospital Stay (HOSPITAL_COMMUNITY): Payer: Medicaid Other

## 2014-06-26 LAB — COMPREHENSIVE METABOLIC PANEL
ALBUMIN: 2.5 g/dL — AB (ref 3.5–5.2)
ALK PHOS: 106 U/L (ref 39–117)
ALT: 68 U/L — ABNORMAL HIGH (ref 0–53)
AST: 108 U/L — AB (ref 0–37)
Anion gap: 3 — ABNORMAL LOW (ref 5–15)
BUN: 10 mg/dL (ref 6–23)
CHLORIDE: 103 mmol/L (ref 96–112)
CO2: 29 mmol/L (ref 19–32)
Calcium: 8.6 mg/dL (ref 8.4–10.5)
Creatinine, Ser: 1.49 mg/dL — ABNORMAL HIGH (ref 0.50–1.35)
GFR calc Af Amer: 58 mL/min — ABNORMAL LOW (ref >90)
GFR, EST NON AFRICAN AMERICAN: 50 mL/min — AB (ref >90)
Glucose, Bld: 136 mg/dL — ABNORMAL HIGH (ref 70–99)
Potassium: 4 mmol/L (ref 3.5–5.1)
Sodium: 135 mmol/L (ref 135–145)
Total Bilirubin: 1.5 mg/dL — ABNORMAL HIGH (ref 0.3–1.2)
Total Protein: 7.4 g/dL (ref 6.0–8.3)

## 2014-06-26 LAB — IRON AND TIBC
IRON: 92 ug/dL (ref 42–165)
Saturation Ratios: 32 % (ref 20–55)
TIBC: 286 ug/dL (ref 215–435)
UIBC: 194 ug/dL (ref 125–400)

## 2014-06-26 LAB — CBC
HCT: 35.3 % — ABNORMAL LOW (ref 39.0–52.0)
Hemoglobin: 12.1 g/dL — ABNORMAL LOW (ref 13.0–17.0)
MCH: 29.9 pg (ref 26.0–34.0)
MCHC: 34.3 g/dL (ref 30.0–36.0)
MCV: 87.2 fL (ref 78.0–100.0)
Platelets: 91 10*3/uL — ABNORMAL LOW (ref 150–400)
RBC: 4.05 MIL/uL — AB (ref 4.22–5.81)
RDW: 13.8 % (ref 11.5–15.5)
WBC: 7.3 10*3/uL (ref 4.0–10.5)

## 2014-06-26 MED ORDER — HYDRALAZINE HCL 50 MG PO TABS
50.0000 mg | ORAL_TABLET | Freq: Three times a day (TID) | ORAL | Status: DC
  Administered 2014-06-26 – 2014-06-29 (×8): 50 mg via ORAL
  Filled 2014-06-26 (×8): qty 1

## 2014-06-26 MED ORDER — GADOBENATE DIMEGLUMINE 529 MG/ML IV SOLN
20.0000 mL | Freq: Once | INTRAVENOUS | Status: AC | PRN
  Administered 2014-06-26: 20 mL via INTRAVENOUS

## 2014-06-26 NOTE — Evaluation (Signed)
Occupational Therapy Evaluation Patient Details Name: John [name] MRN: [id number] DOB: 07-17-1955 Today's Date: 06/26/2014    History of Present Illness Pt is a 59 y.o. male with history of hypertension, chronic low back pain, and polysubstance abuse who was referred to the ER by ophthalmologist after patient was complaining of left eye vision loss. Patient states he had sudden loss of vision since Monday 5 days ago. Patient denies any loss of function of the upper or lower extremities. In the ER patient had MRI of the brain which shows possible left optic neuritis and also bilateral abnormal thalamic signal.    Clinical Impression   Pt admitted with above. Pt independent with ADLs, PTA. Feel pt will benefit from acute OT to increase independence/safety prior to d/c.      Follow Up Recommendations  Outpatient OT;Supervision - Intermittent    Equipment Recommendations  Tub/shower bench    Recommendations for Other Services       Precautions / Restrictions Precautions Precautions: Fall Precaution Comments: Decreased vision in L eye Restrictions Weight Bearing Restrictions: No      Mobility Bed Mobility Overal bed mobility: Modified Independent                Transfers Overall transfer level: Needs assistance Equipment used: Rolling walker (2 wheeled) Transfers: Sit to/from Stand Sit to Stand: Supervision         General transfer comment: cues for technique/hand placement.    Balance    No LOB in session-used walker for ambulation.                                        ADL Overall ADL's : Needs assistance/impaired                     Lower Body Dressing: Set up;Supervision/safety;Sit to/from stand Lower Body Dressing Details (indicate cue type and reason): suggested crossing legs for LB dressing Toilet Transfer: Ambulation;RW;Min guard (bed/chair)           Functional mobility during ADLs: Min guard;Rolling  walker General ADL Comments: Educated on safety such as safe shoewear, sitting for most of LB ADLs, rugs, and recommended someone be with him for tub transfer. Told pt to compensate for decreased vision by turning his head to the left.  Pt out of breath when changing socks-cues for deep breathing technique and energy conservation. Educated on tub transfer technique with tub bench.     Vision Pt reports blurry vision in left eye and limited vision in left eye Vision Assessment?: Yes Tracking/Visual Pursuits:  (difficult) Visual Fields: Other (comment) (inconsistent on left side)   Perception     Praxis      Pertinent Vitals/Pain Pain Assessment: 0-10 Pain Score:  (8 and 9) Pain Location: Rt hand at IV site and back Pain Descriptors / Indicators: Throbbing;Sore (hand sore and back throbbing) Pain Intervention(s): Monitored during session;Repositioned     Hand Dominance Right   Extremity/Trunk Assessment Upper Extremity Assessment Upper Extremity Assessment: RUE deficits/detail;LUE deficits/detail RUE Sensation: decreased light touch (in digits) RUE Coordination: decreased fine motor LUE Coordination: decreased fine motor   Lower Extremity Assessment Lower Extremity Assessment: Defer to PT evaluation       Communication Communication Communication: No difficulties   Cognition Arousal/Alertness: Awake/alert Behavior During Therapy: WFL for tasks assessed/performed Overall Cognitive Status: Within Functional Limits for tasks assessed  General Comments       Exercises       Shoulder Instructions      Home Living Family/patient expects to be discharged to:: Private residence Living Arrangements: Other relatives Available Help at Discharge: Family;Available PRN/intermittently Type of Home: House Home Access: Level entry     Home Layout: One level     Bathroom Shower/Tub: Teacher, early years/pre: Handicapped height (thinks  it is higher than normal)     Home Equipment: Cane - single point          Prior Functioning/Environment Level of Independence: Independent        Comments: Pt states he occasionally took his "walking stick" out with him for extra security while walking. States he has had 1 fall in the past year in which pt fell down the stairs while playing with his great-niece.     OT Diagnosis: Acute pain;Disturbance of vision   OT Problem List: Pain;Impaired sensation;Decreased knowledge of precautions;Decreased knowledge of use of DME or AE;Decreased coordination;Impaired vision/perception;Decreased range of motion;Decreased activity tolerance   OT Treatment/Interventions: Self-care/ADL training;DME and/or AE instruction;Energy conservation;Therapeutic exercise;Therapeutic activities;Visual/perceptual remediation/compensation;Patient/family education;Balance training    OT Goals(Current goals can be found in the care plan section) Acute Rehab OT Goals Patient Stated Goal: not stated OT Goal Formulation: With patient Time For Goal Achievement: 07/03/14 Potential to Achieve Goals: Good ADL Goals Pt Will Perform Lower Body Dressing: with modified independence;sit to/from stand Pt Will Transfer to Toilet: with modified independence;ambulating;grab bars (comfort height) Pt Will Perform Tub/Shower Transfer: Tub transfer;with supervision;ambulating;tub bench;rolling walker  OT Frequency: Min 2X/week   Barriers to D/C:            Co-evaluation              End of Session Equipment Utilized During Treatment: Gait belt;Rolling walker Nurse Communication: Other (comment) (wants apple juice-OT went to get)  Activity Tolerance: Patient tolerated treatment well Patient left: in chair;with call bell/phone within reach   Time: 1046-1104 OT Time Calculation (min): 18 min Charges:  OT General Charges $OT Visit: 1 Procedure OT Evaluation $Initial OT Evaluation Tier I: 1 Procedure G-CodesBenito Mccreedy OTR/L [id number] 06/26/2014, 11:15 AM

## 2014-06-26 NOTE — Progress Notes (Signed)
Patient states that he can see normally out of his left eye at this point. He let the RN know he could see her and that he has no vision loss at this time. Will continue to monitor. Katharin Schneider, Rande Brunt, RN

## 2014-06-26 NOTE — Progress Notes (Signed)
Patient ID: John [name]  male  LAG:[id number]    DOB: [birth year]    DOA: 06/23/2014  PCP: No PCP Per Patient   Brief history of present illness  John [name] is a 59 y.o. male with history of hypertension chronic low back pain and polysubstance abuse who was referred to the ER by ophthalmologist after patient was complaining of left eye vision loss. Patient reported that he had sudden loss of vision since Monday 5 days ago. Patient denieD any loss of function of the upper or lower extremities denies any headache fever chills nausea vomiting dizziness or loss of consciousness. In the ER patient had MRI of the brain which shows possible left optic neuritis and also bilateral abnormal thalamic signal. Patient was seen by neurology on call, recommended further management.  In addition patient reported that over the last 2 weeks he had having some shortness of breath, denied any chest pain productive cough fever chills.   Assessment/Plan: Principal Problem:   Vision loss, left eye: ? Optic neuritis, per patient mild improvement - MRI of the brain showed suspected edema and enhancement left optic nerve, limited assessment, may reflect optic neuritis, no evidence of ischemia. Bilateral abnormal thalamic signal can be seen with hypertensive encephalopathy metabolic disturbance less likely Wernicke's encephalopathy. Chronic small vessel ischemic disease. 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 -  ESR 19, CRP low - UDS positive for marijuana and cocaine - Neurology started patient on Solu-Medrol for 3 days - will likely need neurology outpatient follow-up  - 2-D echo showed EF of 60-65%, carotid Doppler showed 1-39% ICA stenosis, on aspirin 325 mg daily.  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  - hepatitis panel positive for HCV Ab    Polysubstance abuse - UDS positive for cocaine and marijuana, the patient reports that he did cocaine a week ago. Counseled strongly on cocaine cessation     Acute renal failure - Continue to hold his lisinopril, HCTZ, ibuprofen, Mobic  - Continue gentle hydration for now     SOB (shortness of breath) with elevated troponins likely due to cocaine use, grade 1 diastolic dysfunction - BNP elevated at 1168, troponin slightly elevated 0.04 -  2-D echocardiogram  showed EF of 12-24%, grade 1 diastolic dysfunction     Thrombocytopenia with elevated LFTs -  monitor counts closely   alcohol use - Place on CIWA protocol, thiamine, folic acid     Hypokalemia - Replaced    Elevated troponin: Possibly due to cocaine use and accelerated hypertension -No chest pain currently, placed on aspirin, no beta blockers secondary to cocaine use, no ACE inhibitor secondary to acute renal insufficiency -  lipid panel showed cholesterol 111, LDL 67, no statins due to transaminitis - 2-D echo showed EF of 82-50%, grade 1 diastolic dysfunction  DVT Prophylaxis: SCD's   Code Status:full code  Family Communication:   Disposition:   Consultants:  Neuro  Procedures:  MRI brain  Antibiotics:  None    Subjective: Feels vision is improving significantly, no chest pain or shortness of breath  Objective: Weight change:   Intake/Output  Summary (Last 24 hours) at 06/26/14 1204 Last data filed at 06/26/14 0831  Gross per 24 hour  Intake      0 ml  Output   1200 ml  Net  -1200 ml   Blood pressure 158/94, pulse 98, temperature 98.1 F (36.7 C), temperature source Oral, resp. rate  20, height _0  (1.778 m), weight 113.399 kg (250 lb), SpO2 96 %.  Physical Exam: General: Alert and oriented 3, NAD CVS: S1-S2 clear, no murmur rubs or gallops Chest: clear to auscultation bilaterally Abdomen: soft, NT, ND, NBS Extremities: no c/c/e bilaterally   Lab Results: Basic Metabolic Panel:  Recent Labs Lab 06/25/14 0539 06/26/14 0054  NA 138 135  K 3.4* 4.0  CL 101 103  CO2 27 29  GLUCOSE 90 136*  BUN 10 10  CREATININE 1.42* 1.49*  CALCIUM 8.2* 8.6   Liver Function Tests:  Recent Labs Lab 06/25/14 0758 06/26/14 0054  AST 114* 108*  ALT 62* 68*  ALKPHOS 109 106  BILITOT 1.8* 1.5*  PROT 7.1 7.4  ALBUMIN 2.5* 2.5*   No results for input(s): LIPASE, AMYLASE in the last 168 hours.  Recent Labs Lab 06/24/14 1717  AMMONIA 31   CBC:  Recent Labs Lab 06/24/14 0802 06/25/14 0539 06/26/14 0054  WBC 8.9 7.1 7.3  NEUTROABS 7.5  --   --   HGB 13.5 11.6* 12.1*  HCT 39.0 34.5* 35.3*  MCV 88.0 89.1 87.2  PLT 94* 95* 91*   Cardiac Enzymes:  Recent Labs Lab 06/24/14 1225 06/24/14 1717 06/24/14 2242  TROPONINI 0.03 0.04* 0.06*   BNP: Invalid input(s): POCBNP CBG: No results for input(s): GLUCAP in the last 168 hours.   Micro Results: No results found for this or any previous visit (from the past 240 hour(s)).  Studies/Results: Mr John [name] Wo Contrast  06/24/2014   CLINICAL DATA:  Woke up with LEFT eye blindness 2-3 days ago, blurred vision, headaches, pressure behind eyes. History of hypertension and stroke.  EXAM: MRI HEAD WITHOUT CONTRAST  TECHNIQUE: Multiplanar, multiecho pulse sequences of the brain and surrounding structures were obtained without intravenous contrast.  COMPARISON:  None.  FINDINGS: Mild moderately motion degraded examination. No reduced diffusion to suggest acute ischemia. Punctate foci of susceptibility artifact within the deep gray nuclei. No midline shift, mass effect or mass lesions.  Disproportionate symmetric cerebral  volume loss for age. LEFT greater than RIGHT confluent T2 hyperintense signal within the thalamus, relatively sparing of the thalamus. Additional punctate T2 hyperintensities in the bilateral basal ganglia suggestive lacunar infarcts and/or perivascular spaces. No abnormal parenchymal enhancement. Patchy T2 hyperintensities in the pons, moderate white matter changes suggest chronic small vessel ischemic disease.  No abnormal extra-axial fluid collections, extra-axial masses, abnormal enhancement. Dolichoectatic vascular flow voids at the skull base.  Equivocal findings for T2 bright signal within the LEFT optic nerve, with equivocal superimposed enhancement on motion degraded coronal T1 post contrast sequences.  Less sphenoid sinusitis. The mastoid air cells are well aerated. No abnormal sellar expansion. No cerebellar tonsillar ectopia.  IMPRESSION: Mild to moderate motion degraded examination. Suspected edema and enhancement LEFT optic nerve, limited assessment, this may reflect optic neuritis, no evidence of ischemia.  Bilateral abnormal thalamic signal, this can be seen with hypertensive encephalopathy, metabolic disturbance, less likely Wernicke encephalopathy. Recommend followup.  Dolichoectatic Intracranial vessels suggest sequelae of chronic hypertension. Moderate white matter changes may reflect chronic small vessel ischemic disease.  Cerebellar volume loss, advanced for age.  Moderate white matter changes  suggest chronic small vessel ischemic disease.  Acute findings discussed with and reconfirmed by Dr.JENNIFER PIEPENBRINK on 06/24/2014 at 1:47 am.   Electronically Signed   By: Elon Alas   On: 06/24/2014 01:50   US Abdomen Complete  06/24/2014   CLINICAL DATA:  Abnormal LFTs  EXAM: ULTRASOUND ABDOMEN COMPLETE  COMPARISON:  None.  FINDINGS: Gallbladder: Layering gallbladder sludge. No gallbladder wall thickening or pericholecystic fluid. Negative sonographic Murphy's sign.  Common bile duct:  Diameter: 5 mm  Liver: Hyperechoic hepatic parenchyma with coarse echogenicity and a nodular hepatic contour. No focal hepatic lesion is seen.  IVC: No abnormality visualized.  Pancreas: Not visualized due to overlying bowel gas.  Spleen: Enlarged, measuring 16.2 x 16.9 x 6.1 cm (calculated volume 835 mL.  Right Kidney: Length: 11.9 cm.  No mass or hydronephrosis.  Left Kidney: Length: 13.4 cm.  No mass or hydronephrosis.  Abdominal aorta: No aneurysm visualized.  Other findings: None.  IMPRESSION: Suspected cirrhosis with possible superimposed hepatic steatosis.  Splenomegaly.  Gallbladder sludge.   Electronically Signed   By: Julian Hy M.D.   On: 06/24/2014 23:09   Dg Chest Port 1 View  06/24/2014   CLINICAL DATA:  Cough, shortness of breath, congestion and chest discomfort.  EXAM: PORTABLE CHEST - 1 VIEW  COMPARISON:  05/26/2014  FINDINGS: Mild cardiomegaly is unchanged allowing for differences in technique. Pulmonary vasculature is normal. No consolidation, pleural effusion, or pneumothorax. No acute osseous abnormalities are seen.  IMPRESSION: Stable cardiomegaly.  No acute or localizing pulmonary process.   Electronically Signed   By: Jeb Levering M.D.   On: 06/24/2014 06:11   Mr Darnelle Catalan Wo/w Cm  06/26/2014   CLINICAL DATA:  Sudden LEFT vision loss 6 days ago. Recent MRI demonstrating possible LEFT optic neuritis and bilateral abnormal thalamic signal. History of hypertension and polysubstance abuse.  EXAM: MRI OF THE ORBITS WITHOUT AND WITH CONTRAST  TECHNIQUE: Multiplanar, multisequence MR imaging of the orbits was performed both before and after the administration of intravenous contrast.  CONTRAST:  53m MULTIHANCE GADOBENATE DIMEGLUMINE 529 MG/ML IV SOLN  COMPARISON:  MRI of the brain June 24, 2014  FINDINGS: Expansile T2 bright signal in the LEFT optic nerve, better seen on today's non motion degraded examination (coronal T2 17/30, axial T2 11/23). No abnormal enhancement within the  orbits with particular attention to the optic nerve. Ocular globes intact and unremarkable, normal appearance of the orbital fat. Normal appearance of the extra-ocular muscles.  Relatively unchanged LEFT greater than RIGHT abnormal alignment T2 hyperintense signal without superimposed enhancement. Dolicoectatic intracranial vessels can be seen with chronic hypertension. Mild cerebellar volume loss for age.  Chronic LEFT maxillary sinusitis with mild ethmoid mucosal thickening. The visualized mastoid air cells are well aerated.  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 This is unlikely to reflect demyelination.  Redemonstration of bithalamic abnormal signal; Differential diagnosis includes hypertensive encephalopathy, metabolic disturbance or, less likely Wernicke's encephalopathy.   Electronically Signed   By: CElon Alas  On: 06/26/2014 04:28    Medications: Scheduled Meds: . amLODipine  10 mg Oral Daily  . aspirin EC  325 mg Oral Daily  . folic acid  1 mg Oral Daily  . hydrALAZINE  50 mg Oral 3 times per day  . methylPREDNISolone (SOLU-MEDROL) injection  1,000 mg Intravenous Daily  . multivitamin with minerals  1 tablet Oral Daily  .  pantoprazole  40 mg Oral Daily  . potassium chloride  40 mEq Oral Once  . thiamine  100 mg Oral Daily   Time spent 25 minutes   LOS: 2 days   John [name] M.D. [facility] 06/26/2014, 12:04 PM Pager: [phone]  If 7PM-7AM, please contact night-coverage [email or web address] Password [email or web address]

## 2014-06-27 LAB — CSF CELL COUNT WITH DIFFERENTIAL
RBC COUNT CSF: 4 /mm3 — AB
RBC Count, CSF: 6 /mm3 — ABNORMAL HIGH
TUBE #: 4
Tube #: 1
WBC CSF: 2 /mm3 (ref 0–5)
WBC, CSF: 1 /mm3 (ref 0–5)

## 2014-06-27 LAB — CBC
HCT: 34.8 % — ABNORMAL LOW (ref 39.0–52.0)
Hemoglobin: 11.9 g/dL — ABNORMAL LOW (ref 13.0–17.0)
MCH: 29.8 pg (ref 26.0–34.0)
MCHC: 34.2 g/dL (ref 30.0–36.0)
MCV: 87 fL (ref 78.0–100.0)
Platelets: 93 10*3/uL — ABNORMAL LOW (ref 150–400)
RBC: 4 MIL/uL — ABNORMAL LOW (ref 4.22–5.81)
RDW: 14 % (ref 11.5–15.5)
WBC: 15.9 10*3/uL — ABNORMAL HIGH (ref 4.0–10.5)

## 2014-06-27 LAB — BASIC METABOLIC PANEL
Anion gap: 4 — ABNORMAL LOW (ref 5–15)
BUN: 16 mg/dL (ref 6–23)
CHLORIDE: 100 mmol/L (ref 96–112)
CO2: 31 mmol/L (ref 19–32)
Calcium: 8.7 mg/dL (ref 8.4–10.5)
Creatinine, Ser: 1.42 mg/dL — ABNORMAL HIGH (ref 0.50–1.35)
GFR calc Af Amer: 62 mL/min — ABNORMAL LOW (ref >90)
GFR calc non Af Amer: 53 mL/min — ABNORMAL LOW (ref >90)
Glucose, Bld: 126 mg/dL — ABNORMAL HIGH (ref 70–99)
POTASSIUM: 4.6 mmol/L (ref 3.5–5.1)
SODIUM: 135 mmol/L (ref 135–145)

## 2014-06-27 LAB — VOLATILES,BLD-ACETONE,ETHANOL,ISOPROP,METHANOL
Acetone, blood: NEGATIVE % (ref 0.000–0.010)
Ethanol, blood: NEGATIVE % (ref 0.000–0.010)
ISOPROPANOL, BLOOD: NEGATIVE % (ref 0.000–0.010)
METHANOL, BLOOD: NEGATIVE % (ref 0.000–0.010)

## 2014-06-27 LAB — VITAMIN B1: Vitamin B1 (Thiamine): 107.8 nmol/L (ref 66.5–200.0)

## 2014-06-27 LAB — PROTEIN AND GLUCOSE, CSF
GLUCOSE CSF: 93 mg/dL — AB (ref 43–76)
TOTAL PROTEIN, CSF: 30 mg/dL (ref 15–45)

## 2014-06-27 MED ORDER — ASPIRIN 325 MG PO TBEC: 325.0000 mg | DELAYED_RELEASE_TABLET | Freq: Every day | ORAL | Status: DC

## 2014-06-27 MED ORDER — TRAMADOL HCL 50 MG PO TABS: 50.0000 mg | ORAL_TABLET | Freq: Two times a day (BID) | ORAL | Status: DC | PRN

## 2014-06-27 MED ORDER — METHYLPREDNISOLONE SODIUM SUCC 1000 MG IJ SOLR
1000.0000 mg | Freq: Every day | INTRAMUSCULAR | Status: AC
  Administered 2014-06-28 – 2014-06-29 (×2): 1000 mg via INTRAVENOUS
  Filled 2014-06-27 (×2): qty 8

## 2014-06-27 MED ORDER — SODIUM CHLORIDE 0.9 % IV SOLN
1000.0000 mg | INTRAVENOUS | Status: AC
  Administered 2014-06-27: 1000 mg via INTRAVENOUS
  Filled 2014-06-27: qty 8

## 2014-06-27 MED ORDER — AMLODIPINE BESYLATE 10 MG PO TABS: 10.0000 mg | ORAL_TABLET | Freq: Every day | ORAL | Status: DC

## 2014-06-27 MED ORDER — HYDRALAZINE HCL 50 MG PO TABS: 50.0000 mg | ORAL_TABLET | Freq: Three times a day (TID) | ORAL | Status: DC

## 2014-06-27 NOTE — Evaluation (Signed)
Speech Language Pathology Evaluation Patient Details Name: John [name] MRN: [id number] DOB: 27-Mar-1956 Today's Date: 06/27/2014 Time: 6378-5885 SLP Time Calculation (min) (ACUTE ONLY): 10 min  Problem List:  Patient Active Problem List   Diagnosis Date Noted  . Abnormal LFTs   . Vision loss of left eye 06/24/2014  . Accelerated hypertension 06/24/2014  . Elevated LFTs 06/24/2014  . Polysubstance abuse 06/24/2014  . Acute renal failure 06/24/2014  . SOB (shortness of breath) 06/24/2014  . Vision loss, left eye 06/24/2014  . Thrombocytopenia 06/24/2014  . Hypokalemia 06/24/2014  . Elevated troponin 06/24/2014  . Essential hypertension, benign 12/29/2012  . Neck pain 12/29/2012  . Low back pain 12/29/2012   Past Medical History:  Past Medical History  Diagnosis Date  . Hypertension   . Chronic back pain   . Stroke    Past Surgical History: History reviewed. No pertinent past surgical history. HPI:  Pt is a 59 y.o. male with history of hypertension, chronic low back pain, and polysubstance abuse who was referred to the ER by ophthalmologist after patient was complaining of left eye vision loss. Patient states he had sudden loss of vision since Monday 5 days ago. Patient denies any loss of function of the upper or lower extremities. MRI of the brain which shows possible left optic neuritis and also bilateral abnormal thalamic signal.   Assessment / Plan / Recommendation Clinical Impression  Pt demonstrated appropriate speech intelligibility and language. He achieved 50% for 4 word recall subtest of Cognistat and was not stimulable with choices cue. SLP educated pt to utilize writing and calendars to compensate for important information. He recalled information from MD and PT session. No ST needed at this time.    SLP Assessment  Patient does not need any further Speech Lanaguage Pathology Services    Follow Up Recommendations  None    Frequency and Duration         Pertinent Vitals/Pain Pain Assessment: No/denies pain   SLP Goals     SLP Evaluation Prior Functioning  Cognitive/Linguistic Baseline: Information not available (assume functional) Type of Home: House Available Help at Discharge: Family;Available PRN/intermittently Vocation: Unemployed   Cognition  Overall Cognitive Status: Within Functional Limits for tasks assessed Orientation Level: Oriented X4 Memory: Impaired (see impression statement for details) Memory Impairment: Storage deficit;Retrieval deficit Safety/Judgment: Appears intact    Comprehension  Auditory Comprehension Overall Auditory Comprehension: Appears within functional limits for tasks assessed Visual Recognition/Discrimination Discrimination: Not tested Reading Comprehension Reading Status:  (NT)    Expression Expression Primary Mode of Expression: Verbal Verbal Expression Overall Verbal Expression: Appears within functional limits for tasks assessed Written Expression Dominant Hand: Right Written Expression: Not tested   Oral / Motor Oral Motor/Sensory Function Overall Oral Motor/Sensory Function: Appears within functional limits for tasks assessed Motor Speech Overall Motor Speech: Appears within functional limits for tasks assessed Respiration:  (becomes SOB easily) Intelligibility: Intelligible Motor Planning: Witnin functional limits   GO     John [name] 06/27/2014, 2:34 PM  John [name] M.Ed [employer] [phone]

## 2014-06-27 NOTE — Progress Notes (Signed)
Physical Therapy Treatment and Discharge Patient Details Name: Kainan Patty MRN: [id number] DOB: 08/25/1955 Today's Date: 06/27/2014    History of Present Illness Pt is a 59 y.o. male with history of hypertension, chronic low back pain, and polysubstance abuse who was referred to the ER by ophthalmologist after patient was complaining of left eye vision loss. Patient states he had sudden loss of vision since Monday 5 days ago. Patient denies any loss of function of the upper or lower extremities. In the ER patient had MRI of the brain which shows possible left optic neuritis and also bilateral abnormal thalamic signal.     PT Comments    Pt ambulating without loss of balance or drifting off straight path. Able to perform head turns, velocity changes, and Rt/Lt turns without unsteadiness. No further PT needs at this time. PT is signing off  Follow Up Recommendations  No PT follow up     Equipment Recommendations  None recommended by PT    Recommendations for Other Services       Precautions / Restrictions Precautions Precautions: None Precaution Comments: Decreased vision in L eye Restrictions Weight Bearing Restrictions: No    Mobility  Bed Mobility Overal bed mobility: Modified Independent Bed Mobility: Supine to Sit           General bed mobility comments: incr effort  Transfers Overall transfer level: Independent   Transfers: Sit to/from Stand Sit to Stand: Independent Stand pivot transfers: Independent          Ambulation/Gait Ambulation/Gait assistance: Independent Ambulation Distance (Feet): 400 Feet Assistive device: None Gait Pattern/deviations: WFL(Within Functional Limits);Wide base of support Gait velocity: Decreased   General Gait Details: Slow due to nasal and chest congestion with dyspnea; SaO2 >92% on RA. Head turns, sudden stops, changing velocity with no loss of balance   Stairs            Wheelchair Mobility    Modified  Rankin (Stroke Patients Only) Modified Rankin (Stroke Patients Only) Pre-Morbid Rankin Score: No symptoms Modified Rankin: Moderate disability     Balance Overall balance assessment: Independent                                  Cognition Arousal/Alertness: Awake/alert Behavior During Therapy: WFL for tasks assessed/performed Overall Cognitive Status: Within Functional Limits for tasks assessed                      Exercises      General Comments General comments (skin integrity, edema, etc.): Lt eye vision has improved      Pertinent Vitals/Pain Pain Assessment: No/denies pain    Home Living                      Prior Function            PT Goals (current goals can now be found in the care plan section) Acute Rehab PT Goals Patient Stated Goal: "get back to normal" Progress towards PT goals: Goals met/education completed, patient discharged from PT    Frequency       PT Plan Current plan remains appropriate    Co-evaluation             End of Session Equipment Utilized During Treatment: Gait belt Activity Tolerance: Patient tolerated treatment well Patient left: with call bell/phone within reach;in chair;with chair alarm set     Time:  9758-8325 PT Time Calculation (min) (ACUTE ONLY): 15 min  Charges:  $Gait Training: 8-22 mins                    G Codes:      Aamiyah Derrick 2014/07/16, 10:20 AM Pager [phone]

## 2014-06-27 NOTE — Progress Notes (Signed)
OT Cancellation Note  Patient Details Name: John [name] MRN: [id number] DOB: [birth year]   Cancelled Treatment:    Reason Eval/Treat Not Completed: Patient at procedure or test/ unavailable Just completed lumbar puncture. Will attempt to see in am. [place], OTR/L  [phone] 06/27/2014 06/27/2014, 4:54 PM

## 2014-06-27 NOTE — Care Management Note (Addendum)
Page 1 of 1   06/29/2014     10:51:36 AM CARE MANAGEMENT NOTE 06/29/2014  Patient:  John [name], John [name]   Account Number:  [id number]  Date Initiated:  06/27/2014  Documentation initiated by:  Lorne Skeens  Subjective/Objective Assessment:   Patient was admitted with left eye vision loss.  Lives at home with relatives.     Action/Plan:   Will follow for discharge needs pending PT/OT evals and physician orders.   Anticipated DC Date:     Anticipated DC Plan:  HOME/SELF CARE      DC Planning Services  CM consult  OP Neuro Rehab      Choice offered to / List presented to:  C-1 Patient           Status of service:  Completed, signed off Medicare Important Message given?   (If response is "NO", the following Medicare IM given date fields will be blank) Date Medicare IM given:   Medicare IM given by:   Date Additional Medicare IM given:   Additional Medicare IM given by:    Discharge Disposition:  HOME/SELF CARE  Per UR Regulation:  Reviewed for med. necessity/level of care/duration of stay  If discussed at [place] of Stay Meetings, dates discussed:    Comments:  06/29/14 [place], MSN, CM- Patient was given a paper prescription by attending MD for outpatient neuro rehab.  CM spoke with patient, who would like information faxed to [facility].  Patient was given written information and necessary paperwork/script were faxed.  Original script was returned to the patient.  CM also reiterated the importance of contacting his PCP for a follow-up appointment.  Information regarding follow-up appointments was entered into the AVS. Patient verbalized understanding and denies any additional needs. Bedside RN updated.    06/27/14 [place], MSN, CM- Met with patient to discuss obtaining a PCP and medications per request of attending MD.  Patient is aware that he has been assigned to a specific clinic by Medicaid, and he is familiar with the  location of that office.  CM stressed the importance of calling [facility] soon as possible to schedule a follow-up appointment.  Patient is also aware that he has prescription coverage through [facility] and denies any need for assistance.  Patient verbalized understanding of the importance of medication compliance and physician follow-up at discharge.

## 2014-06-27 NOTE — Procedures (Signed)
Indication: To evaluate for MS  Risks of the procedure were dicussed with the patient including post-LP headache, bleeding, infection, weakness/numbness of legs(radiculopathy), death.  The patient/patient's proxy agreed and written consent was obtained.   The patient was prepped and draped, and using sterile technique a 20 gauge quinke spinal needle was inserted in the L3-4 space. The opening pressure was 14 cm H2O. Approximately 6 cc of CSF were obtained and sent for analysis.   Roland Rack, MD [facility] [phone]  If 7pm- 7am, please page neurology on call as listed in [place].

## 2014-06-27 NOTE — Progress Notes (Signed)
Patient ID: John [name]  male  UOQ:[id number]    DOB: 1955/08/14    DOA: 06/23/2014  PCP: No PCP Per Patient   Brief history of present illness  John [name] is a 59 y.o. male with history of hypertension chronic low back pain and polysubstance abuse who was referred to the ER by ophthalmologist after patient was complaining of left eye vision loss. Patient reported that he had sudden loss of vision since Monday 5 days ago. Patient denieD any loss of function of the upper or lower extremities denies any headache fever chills nausea vomiting dizziness or loss of consciousness. In the ER patient had MRI of the brain which shows possible left optic neuritis and also bilateral abnormal thalamic signal. Patient was seen by neurology on call, recommended further management.  In addition patient reported that over the last 2 weeks he had having some shortness of breath, denied any chest pain productive cough fever chills.   Assessment/Plan: Principal Problem:   Vision loss, left eye: ? Optic neuritis, per patient mild improvement - MRI of the brain showed suspected edema and enhancement left optic nerve, limited assessment, may reflect optic neuritis, no evidence of ischemia. Bilateral abnormal thalamic signal can be seen with hypertensive encephalopathy metabolic disturbance less likely Wernicke's encephalopathy. Chronic small vessel ischemic disease. 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 -  ESR 19, CRP low - UDS positive for marijuana and cocaine - Neurology started patient on Solu-Medrol for 3 days, discussed with Dr. Amada Jupiter today, as patient still has vision loss in his left eye, will continue Solu-Medrol 1 g IV daily for 2 more doses to complete 5 days - will likely need neurology outpatient follow-up  - 2-D echo showed EF  of 60-65%, carotid Doppler showed 1-39% ICA stenosis, on aspirin 325 mg daily.  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  - hepatitis panel positive for HCV Ab    Polysubstance abuse - UDS positive for cocaine and marijuana, the patient reports that he did cocaine a week ago. Counseled strongly on cocaine cessation     Acute renal failure - Continue to hold his lisinopril, HCTZ, ibuprofen, Mobic  - Continue gentle hydration for now     SOB (shortness of breath) with elevated troponins likely due to cocaine use, grade 1 diastolic dysfunction - BNP elevated at 1168, troponin slightly elevated 0.04 -  2-D echocardiogram  showed EF of 60-65%, grade 1 diastolic dysfunction     Thrombocytopenia with elevated LFTs -  monitor counts closely   alcohol use - Place on CIWA protocol, thiamine, folic acid    Elevated troponin: Possibly due to cocaine use and accelerated hypertension -No chest pain currently, placed on aspirin, no beta blockers secondary to cocaine use, no ACE inhibitor secondary to acute renal insufficiency -  lipid panel showed cholesterol 111, LDL 67, no statins due to transaminitis - 2-D echo showed EF of 60-65%, grade 1 diastolic dysfunction  Leukocytosis: Likely due to steroids, afebrile  DVT Prophylaxis: SCD's   Code Status:full code  Family Communication:   Disposition:   Consultants:  Neuro  Procedures:  MRI brain  Antibiotics:  None    Subjective: Vision in left eye is slightly better, no headache or focal neurological deficit, no numbness or tingling or focal weakness  Objective: Weight change:   Intake/Output Summary (Last 24 hours) at  06/27/14 1203 Last data filed at 06/27/14 0700  Gross per 24 hour  Intake    960 ml  Output   1650 ml  Net   -690 ml   Blood pressure 131/75, pulse 96, temperature 98.2 F (36.8 C), temperature source Oral, resp. rate 18, height $RemoveBe'5\' 10"'lUuorfVQz$  (1.778 m), weight 113.399 kg (250 lb), SpO2 95 %.  Physical Exam: General: Alert and oriented 3, NAD CVS: S1-S2 clear Chest: CTAB Abdomen: soft, NT, ND, NBS Extremities: no c/c/e bilaterally   Lab Results: Basic Metabolic Panel:  Recent Labs Lab 06/26/14 0054 06/27/14 0622  NA 135 135  K 4.0 4.6  CL 103 100  CO2 29 31  GLUCOSE 136* 126*  BUN 10 16  CREATININE 1.49* 1.42*  CALCIUM 8.6 8.7   Liver Function Tests:  Recent Labs Lab 06/25/14 0758 06/26/14 0054  AST 114* 108*  ALT 62* 68*  ALKPHOS 109 106  BILITOT 1.8* 1.5*  PROT 7.1 7.4  ALBUMIN 2.5* 2.5*   No results for input(s): LIPASE, AMYLASE in the last 168 hours.  Recent Labs Lab 06/24/14 1717  AMMONIA 31   CBC:  Recent Labs Lab 06/24/14 0802  06/26/14 0054 06/27/14 0622  WBC 8.9  < > 7.3 15.9*  NEUTROABS 7.5  --   --   --   HGB 13.5  < > 12.1* 11.9*  HCT 39.0  < > 35.3* 34.8*  MCV 88.0  < > 87.2 87.0  PLT 94*  < > 91* 93*  < > = values in this interval not displayed. Cardiac Enzymes:  Recent Labs Lab 06/24/14 1225 06/24/14 1717 06/24/14 2242  TROPONINI 0.03 0.04* 0.06*   BNP: Invalid input(s): POCBNP CBG: No results for input(s): GLUCAP in the last 168 hours.   Micro Results: No results found for this or any previous visit (from the past 240 hour(s)).  Studies/Results: Mr John [name] Wo Contrast  06/24/2014   CLINICAL DATA:  Woke up with LEFT eye blindness 2-3 days ago, blurred vision, headaches, pressure behind eyes. History of hypertension and stroke.  EXAM: MRI HEAD WITHOUT CONTRAST  TECHNIQUE: Multiplanar, multiecho pulse sequences of the brain and surrounding structures were obtained without intravenous contrast.  COMPARISON:  None.  FINDINGS:  Mild moderately motion degraded examination. No reduced diffusion to suggest acute ischemia. Punctate foci of susceptibility artifact within the deep gray nuclei. No midline shift, mass effect or mass lesions.  Disproportionate symmetric cerebral volume loss for age. LEFT greater than RIGHT confluent T2 hyperintense signal within the thalamus, relatively sparing of the thalamus. Additional punctate T2 hyperintensities in the bilateral basal ganglia suggestive lacunar infarcts and/or perivascular spaces. No abnormal parenchymal enhancement. Patchy T2 hyperintensities in the pons, moderate white matter changes suggest chronic small vessel ischemic disease.  No abnormal extra-axial fluid collections, extra-axial masses, abnormal enhancement. Dolichoectatic vascular flow voids at the skull base.  Equivocal findings for T2 bright signal within the LEFT optic nerve, with equivocal superimposed enhancement on motion degraded coronal T1 post contrast sequences.  Less sphenoid sinusitis. The mastoid air cells are well aerated. No abnormal sellar expansion. No cerebellar tonsillar ectopia.  IMPRESSION: Mild to moderate motion degraded examination. Suspected edema and enhancement LEFT optic nerve, limited assessment, this may reflect optic neuritis, no  evidence of ischemia.  Bilateral abnormal thalamic signal, this can be seen with hypertensive encephalopathy, metabolic disturbance, less likely Wernicke encephalopathy. Recommend followup.  Dolichoectatic Intracranial vessels suggest sequelae of chronic hypertension. Moderate white matter changes may reflect chronic small vessel ischemic disease.  Cerebellar volume loss, advanced for age.  Moderate white matter changes suggest chronic small vessel ischemic disease.  Acute findings discussed with and reconfirmed by Dr.JENNIFER PIEPENBRINK on 06/24/2014 at 1:47 am.   Electronically Signed   By: Elon Alas   On: 06/24/2014 01:50   US Abdomen Complete  06/24/2014    CLINICAL DATA:  Abnormal LFTs  EXAM: ULTRASOUND ABDOMEN COMPLETE  COMPARISON:  None.  FINDINGS: Gallbladder: Layering gallbladder sludge. No gallbladder wall thickening or pericholecystic fluid. Negative sonographic Murphy's sign.  Common bile duct: Diameter: 5 mm  Liver: Hyperechoic hepatic parenchyma with coarse echogenicity and a nodular hepatic contour. No focal hepatic lesion is seen.  IVC: No abnormality visualized.  Pancreas: Not visualized due to overlying bowel gas.  Spleen: Enlarged, measuring 16.2 x 16.9 x 6.1 cm (calculated volume 835 mL.  Right Kidney: Length: 11.9 cm.  No mass or hydronephrosis.  Left Kidney: Length: 13.4 cm.  No mass or hydronephrosis.  Abdominal aorta: No aneurysm visualized.  Other findings: None.  IMPRESSION: Suspected cirrhosis with possible superimposed hepatic steatosis.  Splenomegaly.  Gallbladder sludge.   Electronically Signed   By: Julian Hy M.D.   On: 06/24/2014 23:09   Dg Chest Port 1 View  06/24/2014   CLINICAL DATA:  Cough, shortness of breath, congestion and chest discomfort.  EXAM: PORTABLE CHEST - 1 VIEW  COMPARISON:  05/26/2014  FINDINGS: Mild cardiomegaly is unchanged allowing for differences in technique. Pulmonary vasculature is normal. No consolidation, pleural effusion, or pneumothorax. No acute osseous abnormalities are seen.  IMPRESSION: Stable cardiomegaly.  No acute or localizing pulmonary process.   Electronically Signed   By: Jeb Levering M.D.   On: 06/24/2014 06:11   Mr John [name] Wo/w Cm  06/26/2014   CLINICAL DATA:  Sudden LEFT vision loss 6 days ago. Recent MRI demonstrating possible LEFT optic neuritis and bilateral abnormal thalamic signal. History of hypertension and polysubstance abuse.  EXAM: MRI OF THE ORBITS WITHOUT AND WITH CONTRAST  TECHNIQUE: Multiplanar, multisequence MR imaging of the orbits was performed both before and after the administration of intravenous contrast.  CONTRAST:  6mL MULTIHANCE GADOBENATE DIMEGLUMINE 529  MG/ML IV SOLN  COMPARISON:  MRI of the brain June 24, 2014  FINDINGS: Expansile T2 bright signal in the LEFT optic nerve, better seen on today's non motion degraded examination (coronal T2 17/30, axial T2 11/23). No abnormal enhancement within the orbits with particular attention to the optic nerve. Ocular globes intact and unremarkable, normal appearance of the orbital fat. Normal appearance of the extra-ocular muscles.  Relatively unchanged LEFT greater than RIGHT abnormal alignment T2 hyperintense signal without superimposed enhancement. Dolicoectatic intracranial vessels can be seen with chronic hypertension. Mild cerebellar volume loss for age.  Chronic LEFT maxillary sinusitis with mild ethmoid mucosal thickening. The visualized mastoid air cells are well aerated.  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 This is unlikely to reflect demyelination.  Redemonstration of bithalamic abnormal signal; Differential diagnosis includes hypertensive encephalopathy, metabolic disturbance or, less likely Wernicke's encephalopathy.   Electronically Signed   By: Elon Alas   On: 06/26/2014 04:28    Medications: Scheduled Meds: . amLODipine  10 mg Oral Daily  . aspirin EC  325 mg Oral Daily  . folic acid  1 mg Oral Daily  . hydrALAZINE  50 mg Oral 3 times per day  . methylPREDNISolone (SOLU-MEDROL) injection  1,000 mg Intravenous STAT  . [START ON 06/28/2014] methylPREDNISolone (SOLU-MEDROL) injection  1,000 mg Intravenous Daily  . multivitamin with minerals  1 tablet Oral Daily  . pantoprazole  40 mg Oral Daily  . potassium chloride  40 mEq Oral Once  . thiamine  100 mg Oral Daily   Time spent 25 minutes   LOS: 3 days   Kenniyah Sasaki M.D. [facility] 06/27/2014, 12:03 PM Pager: [phone]  If 7PM-7AM, please contact  night-coverage [email or web address] Password [email or web address]

## 2014-06-27 NOTE — Progress Notes (Signed)
Subjective: patient feels he has better vision in the left peripheral field of left eye.   Objective: Current vital signs: BP 118/64 mmHg  Pulse 86  Temp(Src) 98.2 F (36.8 C) (Oral)  Resp 18  Ht 5\' 10"  (1.778 m)  Wt 113.399 kg (250 lb)  BMI 35.87 kg/m2  SpO2 93% Vital signs in last 24 hours: Temp:  [97.9 F (36.6 C)-98.4 F (36.9 C)] 98.2 F (36.8 C) (02/29 0559) Pulse Rate:  [81-98] 86 (02/29 0559) Resp:  [16-20] 18 (02/29 0559) BP: (118-158)/(64-94) 118/64 mmHg (02/29 0559) SpO2:  [93 %-96 %] 93 % (02/29 0559)  Intake/Output from previous day: 02/28 0701 - 02/29 0700 In: 960 [P.O.:960] Out: 1900 [Urine:1900] Intake/Output this shift:   Nutritional status: Diet Heart  Neurologic Exam: Mental Status: Alert, oriented, thought content appropriate. Speech fluent without evidence of aphasia. Able to follow 3 step commands without difficulty. Cranial Nerves: II: Only able to see shadows only with the left eye. Unable to count fingers or tell me what color my pen is with the left eye.  Pupils equal, round, reactive to light and accommodation, APD in left eye III,IV, VI: ptosis not present, extra-ocular motions intact bilaterally V,VII: smile symmetric, facial light touch sensation normal bilaterally VIII: hearing normal bilaterally IX,X: gag reflex present XI: bilateral shoulder shrug XII: midline tongue extension without atrophy or fasciculations Motor: Right :Upper extremity 5/5Left: Upper extremity 5/5 Lower extremity 5/5Lower extremity 5/5 Tone and bulk:normal tone throughout; no atrophy noted Sensory: Pinprick and light touch intact throughout, bilaterally Deep Tendon Reflexes:  2+ throughout Plantars: Right: downgoingLeft: downgoing Cerebellar: normal finger-to-nose, normal heel-to-shin testing bilaterally  Lab  Results: Basic Metabolic Panel:  Recent Labs Lab 06/23/14 2214 06/24/14 0802 06/25/14 0539 06/26/14 0054 06/27/14 0622  NA 138 140 138 135 135  K 3.5 3.2* 3.4* 4.0 4.6  CL 99 103 101 103 100  CO2 28 28 27 29 31   GLUCOSE 66* 110* 90 136* 126*  BUN 9 10 10 10 16   CREATININE 1.43* 1.37* 1.42* 1.49* 1.42*  CALCIUM 8.8 8.7 8.2* 8.6 8.7    Liver Function Tests:  Recent Labs Lab 06/23/14 2214 06/24/14 0802 06/25/14 0758 06/26/14 0054  AST 118* 114* 114* 108*  ALT 70* 67* 62* 68*  ALKPHOS 183* 138* 109 106  BILITOT 2.0* 2.2* 1.8* 1.5*  PROT 8.4* 8.2 7.1 7.4  ALBUMIN 3.1* 2.9* 2.5* 2.5*   No results for input(s): LIPASE, AMYLASE in the last 168 hours.  Recent Labs Lab 06/24/14 1717  AMMONIA 31    CBC:  Recent Labs Lab 06/23/14 1726  06/23/14 2214 06/24/14 0802 06/25/14 0539 06/26/14 0054 06/27/14 0622  WBC 7.0  --  8.0 8.9 7.1 7.3 15.9*  NEUTROABS 3.4  --  4.3 7.5  --   --   --   HGB 13.9  < > 13.1 13.5 11.6* 12.1* 11.9*  HCT 39.8  < > 38.0* 39.0 34.5* 35.3* 34.8*  MCV 87.9  --  86.6 88.0 89.1 87.2 87.0  PLT 101*  --  103* 94* 95* 91* 93*  < > = values in this interval not displayed.  Cardiac Enzymes:  Recent Labs Lab 06/24/14 0802 06/24/14 1225 06/24/14 1717 06/24/14 2242  TROPONINI 0.04* 0.03 0.04* 0.06*    Lipid Panel:  Recent Labs Lab 06/24/14 1225  CHOL 111  TRIG 70  HDL 30*  CHOLHDL 3.7  VLDL 14  LDLCALC 67    CBG: No results for input(s): GLUCAP in the last 168 hours.  Microbiology: No results found for this or any previous visit.  Coagulation Studies: No results for input(s): LABPROT, INR in the last 72 hours.  Imaging: Mr Darnelle Catalan Wo/w Cm  06/26/2014   CLINICAL DATA:  Sudden LEFT vision loss 6 days ago. Recent MRI demonstrating possible LEFT optic neuritis and bilateral abnormal thalamic signal. History of hypertension and polysubstance abuse.  EXAM: MRI OF THE ORBITS WITHOUT AND WITH CONTRAST  TECHNIQUE: Multiplanar,  multisequence MR imaging of the orbits was performed both before and after the administration of intravenous contrast.  CONTRAST:  56mL MULTIHANCE GADOBENATE DIMEGLUMINE 529 MG/ML IV SOLN  COMPARISON:  MRI of the brain June 24, 2014  FINDINGS: Expansile T2 bright signal in the LEFT optic nerve, better seen on today's non motion degraded examination (coronal T2 17/30, axial T2 11/23). No abnormal enhancement within the orbits with particular attention to the optic nerve. Ocular globes intact and unremarkable, normal appearance of the orbital fat. Normal appearance of the extra-ocular muscles.  Relatively unchanged LEFT greater than RIGHT abnormal alignment T2 hyperintense signal without superimposed enhancement. Dolicoectatic intracranial vessels can be seen with chronic hypertension. Mild cerebellar volume loss for age.  Chronic LEFT maxillary sinusitis with mild ethmoid mucosal thickening. The visualized mastoid air cells are well aerated.  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 This is unlikely to reflect demyelination.  Redemonstration of bithalamic abnormal signal; Differential diagnosis includes hypertensive encephalopathy, metabolic disturbance or, less likely Wernicke's encephalopathy.   Electronically Signed   By: Elon Alas   On: 06/26/2014 04:28   VASCULAR LAB PRELIMINARY PRELIMINARY PRELIMINARY PRELIMINARY  Carotid Dopplers completed.   Preliminary report: 1-39% ICA stenosis.   2-D echocardiogram showed EF of 27-07%, grade 1 diastolic dysfunction    Medications:  Scheduled: . amLODipine  10 mg Oral Daily  . aspirin EC  325 mg Oral Daily  . folic acid  1 mg Oral Daily  . hydrALAZINE  50 mg Oral 3 times per day  . methylPREDNISolone (SOLU-MEDROL) injection  1,000 mg Intravenous Daily  . multivitamin with minerals  1 tablet Oral  Daily  . pantoprazole  40 mg Oral Daily  . potassium chloride  40 mEq Oral Once  . thiamine  100 mg Oral Daily     Etta Quill PA-C [facility] [phone]  06/27/2014, 8:57 AM  I have seen and evaluated the patient. I have reviewed the above note and made appropriate changes.   Assessment/Plan: 59 yo M with optic neuritis and multiple T2 lesions on MRI. I suspect that this represents multiple sclerosis and he is receiving IV solumedrol. 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 Oligoclonal bands could be supportive of an autoimmune nature to the process(e.g. MS)  1) LP for cells, protein, glucose, OC bands, IgG index.  2) continue solumedrol for total 5 days.   Roland Rack, MD [facility] [phone]  If 7pm- 7am, please page neurology on call as listed in [place].

## 2014-06-28 LAB — HCV RNA QUANT
HCV QUANT LOG: 5.44 {Log} — AB (ref <1.18)
HCV QUANT: 272633 [IU]/mL — AB (ref <15)

## 2014-06-28 NOTE — Progress Notes (Signed)
Subjective: Patient feels his eye sight is improving  Objective: Current vital signs: BP 123/80 mmHg  Pulse 91  Temp(Src) 98.2 F (36.8 C) (Oral)  Resp 18  Ht 5\' 10"  (1.778 m)  Wt 113.399 kg (250 lb)  BMI 35.87 kg/m2  SpO2 95% Vital signs in last 24 hours: Temp:  [97.7 F (36.5 C)-98.8 F (37.1 C)] 98.2 F (36.8 C) (03/01 3846) Pulse Rate:  [77-92] 91 (03/01 0938) Resp:  [18] 18 (03/01 0938) BP: (109-142)/(57-85) 123/80 mmHg (03/01 0938) SpO2:  [92 %-96 %] 95 % (03/01 0938)  Intake/Output from previous day: 02/29 0701 - 03/01 0700 In: 360 [P.O.:360] Out: 375 [Urine:375] Intake/Output this shift: Total I/O In: 360 [P.O.:360] Out: -  Nutritional status: Diet Heart  Neurologic Exam: Mental Status: Alert, oriented, thought content appropriate. Speech fluent without evidence of aphasia. Able to follow 3 step commands without difficulty. Cranial Nerves: II: able to count fingers in medial visual field of left eye and inferior lateral field of left eye. Unable to  tell me what color my pen is with the left eye.  Pupils equal, round, reactive to light and accommodation, APD in left eye III,IV, VI: ptosis not present, extra-ocular motions intact bilaterally V,VII: smile symmetric, facial light touch sensation normal bilaterally VIII: hearing normal bilaterally IX,X: gag reflex present XI: bilateral shoulder shrug XII: midline tongue extension without atrophy or fasciculations Motor: Right :Upper extremity 5/5Left: Upper extremity 5/5 Lower extremity 5/5Lower extremity 5/5 Tone and bulk:normal tone throughout; no atrophy noted Sensory: Pinprick and light touch intact throughout, bilaterally Deep Tendon Reflexes:  2+ throughout Plantars: Right: downgoingLeft: downgoing Cerebellar: normal finger-to-nose, normal heel-to-shin  testing bilaterally  Lab Results: Basic Metabolic Panel:  Recent Labs Lab 06/23/14 2214 06/24/14 0802 06/25/14 0539 06/26/14 0054 06/27/14 0622  NA 138 140 138 135 135  K 3.5 3.2* 3.4* 4.0 4.6  CL 99 103 101 103 100  CO2 28 28 27 29 31   GLUCOSE 66* 110* 90 136* 126*  BUN 9 10 10 10 16   CREATININE 1.43* 1.37* 1.42* 1.49* 1.42*  CALCIUM 8.8 8.7 8.2* 8.6 8.7    Liver Function Tests:  Recent Labs Lab 06/23/14 2214 06/24/14 0802 06/25/14 0758 06/26/14 0054  AST 118* 114* 114* 108*  ALT 70* 67* 62* 68*  ALKPHOS 183* 138* 109 106  BILITOT 2.0* 2.2* 1.8* 1.5*  PROT 8.4* 8.2 7.1 7.4  ALBUMIN 3.1* 2.9* 2.5* 2.5*   No results for input(s): LIPASE, AMYLASE in the last 168 hours.  Recent Labs Lab 06/24/14 1717  AMMONIA 31    CBC:  Recent Labs Lab 06/23/14 1726  06/23/14 2214 06/24/14 0802 06/25/14 0539 06/26/14 0054 06/27/14 0622  WBC 7.0  --  8.0 8.9 7.1 7.3 15.9*  NEUTROABS 3.4  --  4.3 7.5  --   --   --   HGB 13.9  < > 13.1 13.5 11.6* 12.1* 11.9*  HCT 39.8  < > 38.0* 39.0 34.5* 35.3* 34.8*  MCV 87.9  --  86.6 88.0 89.1 87.2 87.0  PLT 101*  --  103* 94* 95* 91* 93*  < > = values in this interval not displayed.  Cardiac Enzymes:  Recent Labs Lab 06/24/14 0802 06/24/14 1225 06/24/14 1717 06/24/14 2242  TROPONINI 0.04* 0.03 0.04* 0.06*    Lipid Panel:  Recent Labs Lab 06/24/14 1225  CHOL 111  TRIG 70  HDL 30*  CHOLHDL 3.7  VLDL 14  LDLCALC 67    CBG: No results for input(s): GLUCAP in  the last 168 hours.  Microbiology: No results found for this or any previous visit.  Coagulation Studies: No results for input(s): LABPROT, INR in the last 72 hours.  Imaging: No results found.  Medications:  Scheduled: . amLODipine  10 mg Oral Daily  . aspirin EC  325 mg Oral Daily  . folic acid  1 mg Oral Daily  . hydrALAZINE  50 mg Oral 3 times per day  . methylPREDNISolone (SOLU-MEDROL) injection  1,000 mg Intravenous Daily  . multivitamin  with minerals  1 tablet Oral Daily  . pantoprazole  40 mg Oral Daily  . potassium chloride  40 mEq Oral Once  . thiamine  100 mg Oral Daily    Assessment/Plan:  59 yo M with optic neuritis and multiple T2 lesions on MRI. I suspect that this could represent multiple sclerosis and he is receiving IV solumedrol (4/5 treatments thus far). 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 Oligoclonal bands could be supportive of an autoimmune nature to the process(e.g. MS)  1) Pending OC bands, IgG index.  2) continue solumedrol for total 5 days (4/5 thus far)      Etta Quill PA-C [facility] [phone]  06/28/2014, 11:02 AM   I have seen and evaluated the patient. I have reviewed the above note and made appropriate changes. He does drink moonshine which could be a source for methanol toxicity, but toxic neuropathies are typically more symmetric. Without enhancement of the optic nerve, unclear what the underlying cause is. Will finish steroids.   Roland Rack, MD [facility] [phone]  If 7pm- 7am, please page neurology on call as listed in [place].

## 2014-06-28 NOTE — Progress Notes (Signed)
Occupational Therapy Treatment Patient Details Name: John [name] MRN: [id number] DOB: 23-Mar-1956 Today's Date: 06/28/2014    History of present illness Pt is a 60 y.o. male with history of hypertension, chronic low back pain, and polysubstance abuse who was referred to the ER by ophthalmologist after patient was complaining of left eye vision loss. Patient states he had sudden loss of vision since Monday 5 days ago. Patient denies any loss of function of the upper or lower extremities. In the ER patient had MRI of the brain which shows possible left optic neuritis and also bilateral abnormal thalamic signal.    OT comments  Pt appears to be demonstrating improvement. Completed education regarding compensatory strategies for visual deficits. Recommend pt follow up with outpt OT to further assess any needs after D/C and assist with any necessary visual aids. Pt safe to D/C home with intermittent S when medically stable. OTsigning off.   Follow Up Recommendations  Outpatient OT;Supervision - Intermittent    Equipment Recommendations  None recommended by OT    Recommendations for Other Services      Precautions / Restrictions Precautions Precautions: None Precaution Comments: Decreased vision in L eye       Mobility Bed Mobility    Independent              Transfers Overall transfer level: Independent                    Balance Overall balance assessment: Independent  No apparent balance deficits       Standing balance support: During functional activity Standing balance-Leahy Scale: Good                     ADL                                         General ADL Comments: Educated pt on safety regarding monovision and resulting changes in depth perception. Reviewed safety techniques such as using hand rails whenever negotiating steps and stabilizing by using walls to brace self when stepping over tub. recommended to pt to have grab  bars installed in shower/tub area. Pt given handout on "low vision". Reviewed handout and recommended for pt to have good lighting at all times and to use nightlights at might and to "furniture walk" to reduce risk of falls.       Vision Eye Alignment: Within Functional Limits Alignment/Gaze Preference: Within Defined Limits Ocular Range of Motion: Within Functional Limits Tracking/Visual Pursuits: Decreased smoothness of horizontal tracking;Decreased smoothness of vertical tracking (with L eye ) Saccades: Additional eye shifts occurred during testing     Depth Perception:  (Pt reports this has improved) Additional Comments: Pt states his vision appears to be improving              Cognition   Behavior During Therapy: [facility] for tasks assessed/performed Overall Cognitive Status: Within Functional Limits for tasks assessed                                                 General Comments  Pt states he feels he is improving. Educated pt on warning signs/symptoms of CVA. Pt reports he has not slept well.    Pertinent Vitals/ Pain  Pain Assessment: 0-10 Pain Score: 3  Pain Location: neck/back Pain Descriptors / Indicators: Aching Pain Intervention(s): Limited activity within patient's tolerance;Monitored during session  Home Living                                          Prior Functioning/Environment              Frequency Min 2X/week     Progress Toward Goals  OT Goals(current goals can now be found in the care plan section)  Progress towards OT goals: Goals met/education completed, patient discharged from OT  Acute Rehab OT Goals Patient Stated Goal: "get back to normal" OT Goal Formulation: With patient Time For Goal Achievement: 07/03/14 Potential to Achieve Goals: Good ADL Goals Pt Will Perform Lower Body Dressing: with modified independence;sit to/from stand Pt Will Transfer to Toilet: with modified  independence;ambulating;grab bars Pt Will Perform Tub/Shower Transfer: Tub transfer;with supervision;ambulating;tub bench;rolling walker  Plan Discharge plan remains appropriate    Co-evaluation                 End of Session Equipment Utilized During Treatment: Gait belt   Activity Tolerance Patient tolerated treatment well   Patient Left in chair;with call bell/phone within reach   Nurse Communication Mobility status        Time: 7034-0352 OT Time Calculation (min): 21 min  Charges: OT General Charges $OT Visit: 1 Procedure OT Treatments $Self Care/Home Management : 8-22 mins  Elison Worrel,HILLARY 06/28/2014, 3:05 PM   [facility], OTR/L  [phone] 06/28/2014

## 2014-06-28 NOTE — Progress Notes (Signed)
Patient ID: John [name]  male  TJQ:[id number]    DOB: 07-20-1955    DOA: 06/23/2014  PCP: No PCP Per Patient   Brief history of present illness  John [name] is a 59 y.o. male with history of hypertension chronic low back pain and polysubstance abuse who was referred to the ER by ophthalmologist after patient was complaining of left eye vision loss. Patient reported that he had sudden loss of vision since Monday 5 days ago. Patient denieD any loss of function of the upper or lower extremities denies any headache fever chills nausea vomiting dizziness or loss of consciousness. In the ER patient had MRI of the brain which shows possible left optic neuritis and also bilateral abnormal thalamic signal. Patient was seen by neurology on call, recommended further management.  In addition patient reported that over the last 2 weeks he had having some shortness of breath, denied any chest pain productive cough fever chills.   Assessment/Plan: Principal Problem:   Vision loss, left eye: ? Optic neuritis, per patient mild improvement - MRI of the brain showed suspected edema and enhancement left optic nerve, limited assessment, may reflect optic neuritis, no evidence of ischemia. Bilateral abnormal thalamic signal can be seen with hypertensive encephalopathy metabolic disturbance less likely Wernicke's encephalopathy. Chronic small vessel ischemic disease. 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 -  ESR 19, CRP low - UDS positive for marijuana and cocaine - Neurology started patient on Solu-Medrol for 3 days, subsequently increased 2 more days to complete 5 days, day #4 today. - will likely need neurology outpatient follow-up  - 2-D echo showed EF of 60-65%, carotid Doppler showed 1-39% ICA stenosis, on aspirin 325 mg daily. 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  - hepatitis panel positive for HCV Ab    Polysubstance abuse - UDS positive for cocaine and marijuana, the patient reports that he did cocaine a week ago. Counseled strongly on cocaine cessation     Acute renal failure - Continue to hold lisinopril, HCTZ, ibuprofen, Mobic  - Chek BMET in a.m.    SOB (shortness of breath) with elevated troponins likely due to cocaine use, grade 1 diastolic dysfunction - BNP elevated at 1168, troponin slightly elevated 0.04 -  2-D echocardiogram  showed EF of 76-22%, grade 1 diastolic dysfunction     Thrombocytopenia with elevated LFTs -  monitor counts closely   alcohol use - Place on CIWA protocol, thiamine, folic acid    Elevated troponin: Possibly due to cocaine use and accelerated hypertension -No chest pain currently, placed on aspirin, no beta blockers secondary to cocaine use, no ACE inhibitor secondary to acute renal insufficiency -  lipid panel showed cholesterol 111, LDL 67, no statins due to transaminitis - 2-D echo showed EF of 63-33%, grade 1 diastolic dysfunction  Leukocytosis: Likely due to steroids, afebrile  DVT Prophylaxis: SCD's   Code Status:full code  Family Communication:   Disposition:  Likely tomorrow if okay with neurology, Solu-Medrol day #4 today  Consultants:  Neuro  Procedures:  MRI brain  Antibiotics:  None  Subjective: Vision in the left eye slightly better, no acute issues overnight   Objective: Weight change:   Intake/Output Summary (Last 24 hours) at 06/28/14 1050 Last data filed at 06/28/14 0900  Gross per 24 hour  Intake    720 ml  Output    375 ml  Net     345 ml   Blood pressure 123/80, pulse 91, temperature 98.2 F (36.8 C), temperature source Oral, resp. rate 18, height $RemoveBe'5\' 10"'YtSLYTqxR$  (1.778 m), weight 113.399 kg (250 lb), SpO2 95 %.  Physical Exam: General: Alert and oriented 3, NAD CVS: S1-S2 clear Chest: CTAB Abdomen: soft, NT, ND, NBS Extremities: no c/c/e bilaterally   Lab Results: Basic Metabolic Panel:  Recent Labs Lab 06/26/14 0054 06/27/14 0622  NA 135 135  K 4.0 4.6  CL 103 100  CO2 29 31  GLUCOSE 136* 126*  BUN 10 16  CREATININE 1.49* 1.42*  CALCIUM 8.6 8.7   Liver Function Tests:  Recent Labs Lab 06/25/14 0758 06/26/14 0054  AST 114* 108*  ALT 62* 68*  ALKPHOS 109 106  BILITOT 1.8* 1.5*  PROT 7.1 7.4  ALBUMIN 2.5* 2.5*   No results for input(s): LIPASE, AMYLASE in the last 168 hours.  Recent Labs Lab 06/24/14 1717  AMMONIA 31   CBC:  Recent Labs Lab 06/24/14 0802  06/26/14 0054 06/27/14 0622  WBC 8.9  < > 7.3 15.9*  NEUTROABS 7.5  --   --   --   HGB 13.5  < > 12.1* 11.9*  HCT 39.0  < > 35.3* 34.8*  MCV 88.0  < > 87.2 87.0  PLT 94*  < > 91* 93*  < > = values in this interval not displayed. Cardiac Enzymes:  Recent Labs Lab 06/24/14 1225 06/24/14 1717 06/24/14 2242  TROPONINI 0.03 0.04* 0.06*   BNP: Invalid input(s): POCBNP CBG: No results for input(s): GLUCAP in the last 168 hours.   Micro Results: No results found for this or any previous visit (from the past 240 hour(s)).  Studies/Results: Mr Jeri Cos Wo Contrast  06/24/2014   CLINICAL DATA:  Woke up with LEFT eye blindness 2-3 days ago, blurred vision, headaches, pressure behind eyes. History of hypertension and stroke.  EXAM: MRI HEAD WITHOUT CONTRAST  TECHNIQUE: Multiplanar, multiecho pulse sequences of the brain and surrounding structures were obtained without intravenous contrast.  COMPARISON:  None.  FINDINGS: Mild moderately motion degraded examination. No reduced diffusion to suggest acute ischemia. Punctate foci of  susceptibility artifact within the deep gray nuclei. No midline shift, mass effect or mass lesions.  Disproportionate symmetric cerebral volume loss for age. LEFT greater than RIGHT confluent T2 hyperintense signal within the thalamus, relatively sparing of the thalamus. Additional punctate T2 hyperintensities in the bilateral basal ganglia suggestive lacunar infarcts and/or perivascular spaces. No abnormal parenchymal enhancement. Patchy T2 hyperintensities in the pons, moderate white matter changes suggest chronic small vessel ischemic disease.  No abnormal extra-axial fluid collections, extra-axial masses, abnormal enhancement. Dolichoectatic vascular flow voids at the skull base.  Equivocal findings for T2 bright signal within the LEFT optic nerve, with equivocal superimposed enhancement on motion degraded coronal T1 post contrast sequences.  Less sphenoid sinusitis. The mastoid air cells are well aerated. No abnormal sellar expansion. No cerebellar tonsillar ectopia.  IMPRESSION: Mild to moderate motion degraded examination. Suspected edema and enhancement LEFT optic nerve, limited assessment, this may reflect optic neuritis, no evidence of ischemia.  Bilateral abnormal thalamic signal, this can be seen with hypertensive encephalopathy,  metabolic disturbance, less likely Wernicke encephalopathy. Recommend followup.  Dolichoectatic Intracranial vessels suggest sequelae of chronic hypertension. Moderate white matter changes may reflect chronic small vessel ischemic disease.  Cerebellar volume loss, advanced for age.  Moderate white matter changes suggest chronic small vessel ischemic disease.  Acute findings discussed with and reconfirmed by Dr.JENNIFER PIEPENBRINK on 06/24/2014 at 1:47 am.   Electronically Signed   By: Elon Alas   On: 06/24/2014 01:50   US Abdomen Complete  06/24/2014   CLINICAL DATA:  Abnormal LFTs  EXAM: ULTRASOUND ABDOMEN COMPLETE  COMPARISON:  None.  FINDINGS: Gallbladder: Layering  gallbladder sludge. No gallbladder wall thickening or pericholecystic fluid. Negative sonographic Murphy's sign.  Common bile duct: Diameter: 5 mm  Liver: Hyperechoic hepatic parenchyma with coarse echogenicity and a nodular hepatic contour. No focal hepatic lesion is seen.  IVC: No abnormality visualized.  Pancreas: Not visualized due to overlying bowel gas.  Spleen: Enlarged, measuring 16.2 x 16.9 x 6.1 cm (calculated volume 835 mL.  Right Kidney: Length: 11.9 cm.  No mass or hydronephrosis.  Left Kidney: Length: 13.4 cm.  No mass or hydronephrosis.  Abdominal aorta: No aneurysm visualized.  Other findings: None.  IMPRESSION: Suspected cirrhosis with possible superimposed hepatic steatosis.  Splenomegaly.  Gallbladder sludge.   Electronically Signed   By: Julian Hy M.D.   On: 06/24/2014 23:09   Dg Chest Port 1 View  06/24/2014   CLINICAL DATA:  Cough, shortness of breath, congestion and chest discomfort.  EXAM: PORTABLE CHEST - 1 VIEW  COMPARISON:  05/26/2014  FINDINGS: Mild cardiomegaly is unchanged allowing for differences in technique. Pulmonary vasculature is normal. No consolidation, pleural effusion, or pneumothorax. No acute osseous abnormalities are seen.  IMPRESSION: Stable cardiomegaly.  No acute or localizing pulmonary process.   Electronically Signed   By: Jeb Levering M.D.   On: 06/24/2014 06:11   Mr Darnelle Catalan Wo/w Cm  06/26/2014   CLINICAL DATA:  Sudden LEFT vision loss 6 days ago. Recent MRI demonstrating possible LEFT optic neuritis and bilateral abnormal thalamic signal. History of hypertension and polysubstance abuse.  EXAM: MRI OF THE ORBITS WITHOUT AND WITH CONTRAST  TECHNIQUE: Multiplanar, multisequence MR imaging of the orbits was performed both before and after the administration of intravenous contrast.  CONTRAST:  22mL MULTIHANCE GADOBENATE DIMEGLUMINE 529 MG/ML IV SOLN  COMPARISON:  MRI of the brain June 24, 2014  FINDINGS: Expansile T2 bright signal in the LEFT optic  nerve, better seen on today's non motion degraded examination (coronal T2 17/30, axial T2 11/23). No abnormal enhancement within the orbits with particular attention to the optic nerve. Ocular globes intact and unremarkable, normal appearance of the orbital fat. Normal appearance of the extra-ocular muscles.  Relatively unchanged LEFT greater than RIGHT abnormal alignment T2 hyperintense signal without superimposed enhancement. Dolicoectatic intracranial vessels can be seen with chronic hypertension. Mild cerebellar volume loss for age.  Chronic LEFT maxillary sinusitis with mild ethmoid mucosal thickening. The visualized mastoid air cells are well aerated.  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 This is unlikely to reflect demyelination.  Redemonstration of bithalamic abnormal signal; Differential diagnosis includes hypertensive encephalopathy, metabolic disturbance or, less likely Wernicke's encephalopathy.   Electronically Signed   By: Elon Alas   On: 06/26/2014 04:28    Medications: Scheduled Meds: . amLODipine  10 mg Oral Daily  . aspirin EC  325 mg Oral Daily  .  folic acid  1 mg Oral Daily  . hydrALAZINE  50 mg Oral 3 times per day  . methylPREDNISolone (SOLU-MEDROL) injection  1,000 mg Intravenous Daily  . multivitamin with minerals  1 tablet Oral Daily  . pantoprazole  40 mg Oral Daily  . potassium chloride  40 mEq Oral Once  . thiamine  100 mg Oral Daily   Time spent 25 minutes   LOS: 4 days   Jamaiya Tunnell M.D. [facility] 06/28/2014, 10:50 AM Pager: [phone]  If 7PM-7AM, please contact night-coverage [email or web address] Password [email or web address]

## 2014-06-29 LAB — BASIC METABOLIC PANEL
Anion gap: 7 (ref 5–15)
BUN: 34 mg/dL — ABNORMAL HIGH (ref 6–23)
CO2: 26 mmol/L (ref 19–32)
CREATININE: 1.71 mg/dL — AB (ref 0.50–1.35)
Calcium: 8.3 mg/dL — ABNORMAL LOW (ref 8.4–10.5)
Chloride: 102 mmol/L (ref 96–112)
GFR calc Af Amer: 49 mL/min — ABNORMAL LOW (ref >90)
GFR calc non Af Amer: 43 mL/min — ABNORMAL LOW (ref >90)
Glucose, Bld: 132 mg/dL — ABNORMAL HIGH (ref 70–99)
Potassium: 4 mmol/L (ref 3.5–5.1)
Sodium: 135 mmol/L (ref 135–145)

## 2014-06-29 MED ORDER — UNABLE TO FIND: Status: DC

## 2014-06-29 NOTE — Discharge Summary (Signed)
Physician Discharge Summary  Patient ID: John [name] MRN: [id number] DOB/AGE: 10-20-1955 59 y.o.  Admit date: 06/23/2014 Discharge date: 06/29/2014  Primary Care Physician:  No PCP Per Patient  Discharge Diagnoses:    . Accelerated hypertension . 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 Elevated LFTs . Polysubstance abuse . 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  Thrombocytopenia . Hypokalemia . Elevated troponin Substance abuse with urine drug screen positive for cocaine and marijuana  Consultations : Neurology, Dr. Leonel Ramsay   Recommendations for Outpatient Follow-up:   Please note patient has visual loss left eye, likely due to optic neuritis and multiple T2 lesions on MRI could represent multiple sclerosis however not clear at this time. Please follow up oligoclonal bands, IgG index. Patient will finish day #5 of IV Solu-Medrol today prior to discharge.  Appointment with neurology was scheduled with Dr. Posey Pronto on 3/14  Patient was assisted with Medicaid, case management provided with all the information to follow up for PCP He has only $3 co-pay with his medications.  Outpatient PT was arranged by case management.  Placed outpatient nephrology referral for chronic kidney disease   TESTS THAT NEED FOLLOW-UP BMET, CBC in 1 week   DIET: Heart healthy diet    Allergies:   Allergies  Allergen Reactions  . Penicillins     Convulsions     Discharge Medications:   Medication List    STOP taking these medications        doxycycline 100 MG capsule  Commonly known as:  VIBRAMYCIN     ibuprofen 200 MG tablet  Commonly known as:  ADVIL,MOTRIN     lisinopril-hydrochlorothiazide 20-25 MG per tablet  Commonly known as:  PRINZIDE,ZESTORETIC     meloxicam 7.5 MG tablet  Commonly known as:  MOBIC     methocarbamol 500 MG tablet  Commonly known as:  ROBAXIN     oxyCODONE-acetaminophen  5-325 MG per tablet  Commonly known as:  ROXICET      TAKE these medications        amLODipine 10 MG tablet  Commonly known as:  NORVASC  Take 1 tablet (10 mg total) by mouth daily.     aspirin 325 MG EC tablet  Take 1 tablet (325 mg total) by mouth daily.     hydrALAZINE 50 MG tablet  Commonly known as:  APRESOLINE  Take 1 tablet (50 mg total) by mouth 3 (three) times daily.     traMADol 50 MG tablet  Commonly known as:  ULTRAM  Take 1 tablet (50 mg total) by mouth every 12 (twelve) hours as needed for moderate pain or severe pain.     UNABLE TO FIND  - Outpatient Physical and occupational therapy  -   -   - Diagnosis: left sided vision loss, optic neuritis         Brief H and P: For complete details please refer to admission H and P, but in brief [name] is a 59 y.o. male with history of hypertension chronic low back pain and polysubstance abuse who was referred to the ER by ophthalmologist after patient was complaining of left eye vision loss. Patient reported that he had sudden loss of vision since Monday 5 days ago. Patient denied any loss of function of the upper or lower extremities denies any headache fever chills nausea vomiting dizziness or loss of consciousness. In the ER patient had MRI of the brain which shows  possible left optic neuritis and also bilateral abnormal thalamic signal. Patient was seen by neurology on call, recommended further management. In addition patient reported that over the last 2 weeks he had having some shortness of breath, denied any chest pain productive cough fever chills.  Hospital Course:   Vision loss, left eye: ? Optic neuritis, per patient mild improvement, possible MS however etiology still not very clear  MRI of the brain showed suspected edema and enhancement left optic nerve, limited assessment, may reflect optic neuritis, no evidence of ischemia. Bilateral abnormal thalamic signal can be seen with hypertensive  encephalopathy metabolic disturbance less likely Wernicke's encephalopathy. Chronic small vessel ischemic disease.  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  ESR 19, CRP low. UDS positive for marijuana and cocaine. Neurology started patient on Solu-Medrol for 3 days, subsequently increased 2 more days to complete 5 days. Patient completed 5 days IV Solu-Medrol prior to discharge.  2-D echo showed EF of 60-65%, carotid Doppler showed 1-39% ICA stenosis, on aspirin 325 mg daily. LP done 2/29, please follow oligoclonal bands, CSF IgG index to rule out for MS. He also admitted to drinking moonshine which could be a source for methanol toxicity.   Accelerated hypertension- BP much better controlled now - Patient was placed on Norvasc and hydralazine.  -2-D echo showed EF of 60-65%, CVA LVH from uncontrolled hypertension.   Elevated LFTs likely due to underlying cirrhosis from alcohol abuse - he drinks alcohol daily, abdominal ultrasound showed suspected cirrhosis with hepatic steatosis.  - hepatitis panel positive for HCV Ab. Patient will need close follow-up of LFTs and hep C, possibly GI referral.   Polysubstance abuse - UDS positive for cocaine and marijuana, the patient reports that he did cocaine a week ago. Counseled strongly on cocaine cessation    Acute renal failure likely chronic kidney disease stage III from uncontrolled hypertensive nephropathy and medication effect. - Lisinopril, hydrochlorothiazide, ibuprofen, Mobic were placed on hold and discontinued. Creatinine ranged between 1.4-1.7. Patient needs outpatient nephrology follow-up, I placed ambulating referral to nephrology.    SOB (shortness of breath) with elevated troponins likely due to cocaine use, grade 1 diastolic dysfunction - BNP elevated at 1168, troponin  slightly elevated 0.04,  2-D echocardiogram showed EF of 52-84%, grade 1 diastolic dysfunction    Thrombocytopenia with elevated LFTs - monitor counts closely  alcohol use - Place on CIWA protocol, thiamine, folic acid inpatient, patient did not go into acute alcohol withdrawals. Patient strongly recommended not to drink moonshine and abstain from alcohol as it is causing cirrhosis at this point. Patient understands the risks of liver effect as well as possible neurological effects.   Elevated troponin: Possibly due to cocaine use and accelerated hypertension -No chest pain currently, placed on aspirin, no beta blockers secondary to cocaine use, no ACE inhibitor secondary to acute renal insufficiency - lipid panel showed cholesterol 111, LDL 67, no statins due to transaminitis - 2-D echo showed EF of 13-24%, grade 1 diastolic dysfunction  Leukocytosis: Likely due to steroids, afebrile   Day of Discharge BP 142/74 mmHg  Pulse 91  Temp(Src) 99.5 F (37.5 C) (Oral)  Resp 18  Ht $R'5\' 10"'ZP$  (1.778 m)  Wt 113.399 kg (250 lb)  BMI 35.87 kg/m2  SpO2 95%  Physical Exam: General: Alert and awake oriented x3 not in any acute distress. CVS: S1-S2 clear no murmur rubs or  gallops Chest: clear to auscultation bilaterally, no wheezing rales or rhonchi Abdomen: soft nontender, nondistended, normal bowel sounds Extremities: no cyanosis, clubbing or edema noted bilaterally Neuro: Cranial nerves II-XII intact, no focal neurological deficits   The results of significant diagnostics from this hospitalization (including imaging, microbiology, ancillary and laboratory) are listed below for reference.    LAB RESULTS: Basic Metabolic Panel:  Recent Labs Lab 06/27/14 0622 06/29/14 0512  NA 135 135  K 4.6 4.0  CL 100 102  CO2 31 26  GLUCOSE 126* 132*  BUN 16 34*  CREATININE 1.42* 1.71*  CALCIUM 8.7 8.3*   Liver Function Tests:  Recent Labs Lab 06/25/14 0758 06/26/14 0054  AST 114*  108*  ALT 62* 68*  ALKPHOS 109 106  BILITOT 1.8* 1.5*  PROT 7.1 7.4  ALBUMIN 2.5* 2.5*   No results for input(s): LIPASE, AMYLASE in the last 168 hours.  Recent Labs Lab 06/24/14 1717  AMMONIA 31   CBC:  Recent Labs Lab 06/24/14 0802  06/26/14 0054 06/27/14 0622  WBC 8.9  < > 7.3 15.9*  NEUTROABS 7.5  --   --   --   HGB 13.5  < > 12.1* 11.9*  HCT 39.0  < > 35.3* 34.8*  MCV 88.0  < > 87.2 87.0  PLT 94*  < > 91* 93*  < > = values in this interval not displayed. Cardiac Enzymes:  Recent Labs Lab 06/24/14 1717 06/24/14 2242  TROPONINI 0.04* 0.06*   BNP: Invalid input(s): POCBNP CBG: No results for input(s): GLUCAP in the last 168 hours.  Significant Diagnostic Studies:  Mr Kizzie Fantasia Contrast  06/24/2014   CLINICAL DATA:  Woke up with LEFT eye blindness 2-3 days ago, blurred vision, headaches, pressure behind eyes. History of hypertension and stroke.  EXAM: MRI HEAD WITHOUT CONTRAST  TECHNIQUE: Multiplanar, multiecho pulse sequences of the brain and surrounding structures were obtained without intravenous contrast.  COMPARISON:  None.  FINDINGS: Mild moderately motion degraded examination. No reduced diffusion to suggest acute ischemia. Punctate foci of susceptibility artifact within the deep gray nuclei. No midline shift, mass effect or mass lesions.  Disproportionate symmetric cerebral volume loss for age. LEFT greater than RIGHT confluent T2 hyperintense signal within the thalamus, relatively sparing of the thalamus. Additional punctate T2 hyperintensities in the bilateral basal ganglia suggestive lacunar infarcts and/or perivascular spaces. No abnormal parenchymal enhancement. Patchy T2 hyperintensities in the pons, moderate white matter changes suggest chronic small vessel ischemic disease.  No abnormal extra-axial fluid collections, extra-axial masses, abnormal enhancement. Dolichoectatic vascular flow voids at the skull base.  Equivocal findings for T2 bright signal  within the LEFT optic nerve, with equivocal superimposed enhancement on motion degraded coronal T1 post contrast sequences.  Less sphenoid sinusitis. The mastoid air cells are well aerated. No abnormal sellar expansion. No cerebellar tonsillar ectopia.  IMPRESSION: Mild to moderate motion degraded examination. Suspected edema and enhancement LEFT optic nerve, limited assessment, this may reflect optic neuritis, no evidence of ischemia.  Bilateral abnormal thalamic signal, this can be seen with hypertensive encephalopathy, metabolic disturbance, less likely Wernicke encephalopathy. Recommend followup.  Dolichoectatic Intracranial vessels suggest sequelae of chronic hypertension. Moderate white matter changes may reflect chronic small vessel ischemic disease.  Cerebellar volume loss, advanced for age.  Moderate white matter changes suggest chronic small vessel ischemic disease.  Acute findings discussed with and reconfirmed by Dr.JENNIFER PIEPENBRINK on 06/24/2014 at 1:47 am.   Electronically Signed   By: Elon Alas   On:  06/24/2014 01:50   US Abdomen Complete  06/24/2014   CLINICAL DATA:  Abnormal LFTs  EXAM: ULTRASOUND ABDOMEN COMPLETE  COMPARISON:  None.  FINDINGS: Gallbladder: Layering gallbladder sludge. No gallbladder wall thickening or pericholecystic fluid. Negative sonographic Murphy's sign.  Common bile duct: Diameter: 5 mm  Liver: Hyperechoic hepatic parenchyma with coarse echogenicity and a nodular hepatic contour. No focal hepatic lesion is seen.  IVC: No abnormality visualized.  Pancreas: Not visualized due to overlying bowel gas.  Spleen: Enlarged, measuring 16.2 x 16.9 x 6.1 cm (calculated volume 835 mL.  Right Kidney: Length: 11.9 cm.  No mass or hydronephrosis.  Left Kidney: Length: 13.4 cm.  No mass or hydronephrosis.  Abdominal aorta: No aneurysm visualized.  Other findings: None.  IMPRESSION: Suspected cirrhosis with possible superimposed hepatic steatosis.  Splenomegaly.  Gallbladder  sludge.   Electronically Signed   By: Julian Hy M.D.   On: 06/24/2014 23:09   Dg Chest Port 1 View  06/24/2014   CLINICAL DATA:  Cough, shortness of breath, congestion and chest discomfort.  EXAM: PORTABLE CHEST - 1 VIEW  COMPARISON:  05/26/2014  FINDINGS: Mild cardiomegaly is unchanged allowing for differences in technique. Pulmonary vasculature is normal. No consolidation, pleural effusion, or pneumothorax. No acute osseous abnormalities are seen.  IMPRESSION: Stable cardiomegaly.  No acute or localizing pulmonary process.   Electronically Signed   By: Jeb Levering M.D.   On: 06/24/2014 06:11    2D ECHO: Study Conclusions  - Left ventricle: The cavity size was normal. Wall thickness was increased in a pattern of severe LVH. Systolic function was normal. The estimated ejection fraction was in the range of 60% to 65%. Doppler parameters are consistent with abnormal left ventricular relaxation (grade 1 diastolic dysfunction). - Left atrium: The atrium was mildly dilated.    Disposition and Follow-up: Discharge Instructions    Ambulatory referral to Nephrology    Complete by:  As directed   59 year old male with uncontrolled hypertension, likely has chronic kidney disease, needs to be followed by nephrology outpatient.     Diet - low sodium heart healthy    Complete by:  As directed      Increase activity slowly    Complete by:  As directed             DISPOSITION: home   DISCHARGE FOLLOW-UP Follow-up Information    Follow up with PATEL, DONIKA, DO On 07/11/2014.   Specialty:  Neurology   Why:  AT 8:00 AM, please arrive 15 mins early    Contact information:   [place] [address] [phone]       Follow up with PCP.   Contact information:   call your Medicaid assigned clinic TODAY for a follow-up appointment       Follow up with [facility].   Specialty:  Rehabilitation    Why:  Office will call with an appointment in 1-2 days.  If you have not received a call by then, call the office   Contact information:   [address] [place] [id number] [facility] [place]       Time spent on Discharge: 35 mins  Signed:   Ileen Kahre M.D. [facility] 06/29/2014, 11:09 AM Pager: [phone]

## 2014-06-29 NOTE — Progress Notes (Signed)
Subjective: Feels no change in his eye sight since yesterday  Objective: Current vital signs: BP 142/74 mmHg  Pulse 91  Temp(Src) 99.5 F (37.5 C) (Oral)  Resp 18  Ht 5\' 10"  (1.778 m)  Wt 113.399 kg (250 lb)  BMI 35.87 kg/m2  SpO2 95% Vital signs in last 24 hours: Temp:  [97.8 F (36.6 C)-99.5 F (37.5 C)] 99.5 F (37.5 C) (03/02 0947) Pulse Rate:  [81-95] 91 (03/02 0947) Resp:  [18] 18 (03/02 0947) BP: (115-142)/(55-85) 142/74 mmHg (03/02 0947) SpO2:  [93 %-99 %] 95 % (03/02 0947)  Intake/Output from previous day: 03/01 0701 - 03/02 0700 In: 720 [P.O.:720] Out: 200 [Urine:200] Intake/Output this shift:   Nutritional status: Diet Heart Diet - low sodium heart healthy  Neurologic Exam: Mental Status: Alert, oriented, thought content appropriate. Speech fluent without evidence of aphasia. Able to follow 3 step commands without difficulty. Cranial Nerves: II: able to count fingers in medial visual field of left eye and inferior lateral field of left eye. Unable to tell me what color my pen is with the left eye.  Pupils equal, round, reactive to light and accommodation, APD in left eye III,IV, VI: ptosis not present, extra-ocular motions intact bilaterally V,VII: smile symmetric, facial light touch sensation normal bilaterally VIII: hearing normal bilaterally IX,X: gag reflex present XI: bilateral shoulder shrug XII: midline tongue extension without atrophy or fasciculations Motor: Right :Upper extremity 5/5Left: Upper extremity 5/5 Lower extremity 5/5Lower extremity 5/5 Tone and bulk:normal tone throughout; no atrophy noted Sensory: Pinprick and light touch intact throughout, bilaterally Deep Tendon Reflexes:  2+ throughout Plantars: Right: downgoingLeft: downgoing Cerebellar: normal finger-to-nose, normal  heel-to-shin testing bilaterally   Lab Results: Basic Metabolic Panel:  Recent Labs Lab 06/24/14 0802 06/25/14 0539 06/26/14 0054 06/27/14 0622 06/29/14 0512  NA 140 138 135 135 135  K 3.2* 3.4* 4.0 4.6 4.0  CL 103 101 103 100 102  CO2 28 27 29 31 26   GLUCOSE 110* 90 136* 126* 132*  BUN 10 10 10 16  34*  CREATININE 1.37* 1.42* 1.49* 1.42* 1.71*  CALCIUM 8.7 8.2* 8.6 8.7 8.3*    Liver Function Tests:  Recent Labs Lab 06/23/14 2214 06/24/14 0802 06/25/14 0758 06/26/14 0054  AST 118* 114* 114* 108*  ALT 70* 67* 62* 68*  ALKPHOS 183* 138* 109 106  BILITOT 2.0* 2.2* 1.8* 1.5*  PROT 8.4* 8.2 7.1 7.4  ALBUMIN 3.1* 2.9* 2.5* 2.5*   No results for input(s): LIPASE, AMYLASE in the last 168 hours.  Recent Labs Lab 06/24/14 1717  AMMONIA 31    CBC:  Recent Labs Lab 06/23/14 1726  06/23/14 2214 06/24/14 0802 06/25/14 0539 06/26/14 0054 06/27/14 0622  WBC 7.0  --  8.0 8.9 7.1 7.3 15.9*  NEUTROABS 3.4  --  4.3 7.5  --   --   --   HGB 13.9  < > 13.1 13.5 11.6* 12.1* 11.9*  HCT 39.8  < > 38.0* 39.0 34.5* 35.3* 34.8*  MCV 87.9  --  86.6 88.0 89.1 87.2 87.0  PLT 101*  --  103* 94* 95* 91* 93*  < > = values in this interval not displayed.  Cardiac Enzymes:  Recent Labs Lab 06/24/14 0802 06/24/14 1225 06/24/14 1717 06/24/14 2242  TROPONINI 0.04* 0.03 0.04* 0.06*    Lipid Panel:  Recent Labs Lab 06/24/14 1225  CHOL 111  TRIG 70  HDL 30*  CHOLHDL 3.7  VLDL 14  LDLCALC 67    CBG: No results for input(s):  GLUCAP in the last 168 hours.  Microbiology: No results found for this or any previous visit.  Coagulation Studies: No results for input(s): LABPROT, INR in the last 72 hours.  Imaging: No results found.  Medications:  Scheduled: . amLODipine  10 mg Oral Daily  . aspirin EC  325 mg Oral Daily  . folic acid  1 mg Oral Daily  . hydrALAZINE  50 mg Oral 3 times per day  . methylPREDNISolone (SOLU-MEDROL) injection  1,000 mg Intravenous  Daily  . multivitamin with minerals  1 tablet Oral Daily  . pantoprazole  40 mg Oral Daily  . potassium chloride  40 mEq Oral Once  . thiamine  100 mg Oral Daily   Continuous:  RJP:[id number], senna-docusate, traMADol  Assessment/Plan:  59 yo M with optic neuritis and multiple T2 lesions on MRI. I suspect that this could represent multiple sclerosis but not clear at this time. 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 Pending OC bands, IgG index. Patient has at this time finished 5 treatments of methylprednisolone.    Recommend out patient neurology follow up.  Pending labs may be followed up as out patient.    Etta Quill PA-C [facility] [phone]  06/29/2014, 10:47 AM

## 2014-06-29 NOTE — Progress Notes (Signed)
Pt is being discharged home. Discharge instructions were given to patient

## 2014-06-30 LAB — CSF IGG: IGG CSF: 6.2 mg/dL (ref 0.8–7.7)

## 2014-07-01 LAB — HEAVY METALS, RANDOM URINE
ARSENICRANUR: 2 ug/g{creat} (ref <51)
Creatinine Random, Urine: 200.1 mg/dL (ref 20.0–370.0)

## 2014-07-01 LAB — OLIGOCLONAL BANDS, CSF + SERM

## 2014-07-04 LAB — MISCELLANEOUS TEST

## 2014-07-04 LAB — ARSENIC, URINE, 24 HOUR: CREATININE, URINE-MG/DL-ARSUR: 121 mg/dL (ref >50)

## 2014-07-06 LAB — NEUROMYELITIS OPTICA AUTOAB, IGG

## 2014-07-11 ENCOUNTER — Ambulatory Visit (INDEPENDENT_AMBULATORY_CARE_PROVIDER_SITE_OTHER): Payer: Medicaid Other | Admitting: Neurology

## 2014-07-11 ENCOUNTER — Encounter: Payer: Self-pay | Admitting: Neurology

## 2014-07-11 ENCOUNTER — Other Ambulatory Visit: Payer: Self-pay | Admitting: Neurology

## 2014-07-11 ENCOUNTER — Ambulatory Visit
Admission: RE | Admit: 2014-07-11 | Discharge: 2014-07-11 | Disposition: A | Discharge status: Home / Self Care | Payer: Medicaid Other | Source: Ambulatory Visit | Attending: Neurology | Admitting: Neurology

## 2014-07-11 VITALS — BP 130/78 | HR 84 | Ht 68.0 in | Wt 270.6 lb

## 2014-07-11 DIAGNOSIS — H469 Unspecified optic neuritis: Secondary | ICD-10-CM

## 2014-07-11 DIAGNOSIS — H468 Other optic neuritis: Secondary | ICD-10-CM

## 2014-07-11 DIAGNOSIS — R29898 Other symptoms and signs involving the musculoskeletal system: Secondary | ICD-10-CM

## 2014-07-11 DIAGNOSIS — R768 Other specified abnormal immunological findings in serum: Secondary | ICD-10-CM

## 2014-07-11 DIAGNOSIS — R292 Abnormal reflex: Secondary | ICD-10-CM

## 2014-07-11 DIAGNOSIS — R894 Abnormal immunological findings in specimens from other organs, systems and tissues: Secondary | ICD-10-CM

## 2014-07-11 DIAGNOSIS — F1910 Other psychoactive substance abuse, uncomplicated: Secondary | ICD-10-CM

## 2014-07-11 DIAGNOSIS — R945 Abnormal results of liver function studies: Secondary | ICD-10-CM

## 2014-07-11 DIAGNOSIS — R7989 Other specified abnormal findings of blood chemistry: Secondary | ICD-10-CM

## 2014-07-11 NOTE — Patient Instructions (Addendum)
1.  It is very important that you establish care with a primary care provider because there are number of medical issues that need to be addressed including high blood pressure, kidney problems, and liver problems including hepatitis C 2.  Strongly encouraged to stop drinking alcohol and drug use 3.  MRI lumbar spine 4.  Check blood work 5.  Please see an opthalmology for your vision loss 6.  Return to clinic in 6 weeks

## 2014-07-11 NOTE — Progress Notes (Signed)
Note faxed to [facility] and to [facility].

## 2014-07-11 NOTE — Progress Notes (Signed)
[facility] Clinic Note - Initial Visit   Date: 07/11/2014  Romello Hoehn MRN: [id number] DOB: 06-05-1955   Dear Dr. Tana Coast:  Thank you for your kind referral of Jaidon Ellery for consultation of left optic neuritis. Although his history is well known to you, please allow [place] to reiterate it for the purpose of our medical record. The patient was accompanied to the clinic by self.    History of Present Illness: Ethin Drummond is a 59 y.o. right-handed African American male with polysubstance abuse (alcohol, marijuana, and cocaine), transaminitis, renal insufficiency, hypertenstion, stroke (1998, no residual deficits), and hepatitis C presenting for evaluation of left optic neuritis.    He presented to [facility] on 06/23/2014 with one-week history of left vision loss, described as only being able to see a small amount from the left eye with darkness surrounding.  He had associated color desaturation and eye pain.  Patient underwent imaging of the brain which showed edema and enhancement of the left optic nerve as well as abnormal thalamic signal and scattered white matter changes.  On presentation patient was unable to count fingers with the left eye.  He was admitted for 5-day IVMP and reports having moderate improvement of his vision field because he is able to see much more, but still cannot discern colors.  He underwent CSF testing which showed normal cell count, protein, mildly elevated glucose, and normal IgG index.  Two bands were present.  NMO antibody was negative.  The diagnosis remained uncertain as to whether this represented first presentation of multiple sclerosis vs toxic optic neuropathy (alcohol). Of note, his labs showed mild elevated in liver and kidney function, as well as positive hepatitis C titers.  He was unaware of this until I mentioned it to him today.  He has a 30+ year history of drinking liquor, mostly moonshine and preceding  the onset of his vision changes, says that he was given some "bad moonshine" by a friend.  He knew it was bad because it smelled and tasted horrible, but he still drank it. Slow onset of vision problems started afterwards, but progressed until he developed nearly complete vision loss and pain which prompted him to call his mom who told him to to the emergency department.    Since being in the hospital, he reports doing much better because he is able to vision is returning, but color discrimation has not turned.  No prior spells of vision loss.  He reports having intermittent numbness of his arms and legs.  He also complains of chronic back pain and difficulty with walking so has been using a cane moreso since his vision problems started.    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 This is unlikely to reflect demyelination.  Redemonstration of bithalamic abnormal signal; Differential diagnosis includes hypertensive encephalopathy, metabolic disturbance or, less likely Wernicke's encephalopathy.  MRI brain 06/24/2014: Mild to moderate motion degraded examination. Suspected edema  and enhancement LEFT optic nerve, limited assessment, this may reflect optic neuritis, no evidence of ischemia.  Bilateral abnormal thalamic signal, this can be seen with hypertensive encephalopathy, metabolic disturbance, less likely Wernicke encephalopathy.  Recommend followup.  Dolichoectatic Intracranial vessels suggest sequelae of chronic hypertension. Moderate white matter changes may reflect chronic small vessel ischemic disease.  Cerebellar volume loss, advanced for age.  Moderate white matter changes suggest chronic small vessel ischemic disease.   Past Medical History  Diagnosis Date  . Hypertension   . Chronic back pain   . Stroke 1998  . Polysubstance abuse   . Optic neuropathy, left     Past Surgical History  Procedure Laterality Date  . None       Medications:  Current Outpatient Prescriptions on File Prior to Visit  Medication Sig Dispense Refill  . amLODipine (NORVASC) 10 MG tablet Take 1 tablet (10 mg total) by mouth daily. 30 tablet 4  . aspirin EC 325 MG EC tablet Take 1 tablet (325 mg total) by mouth daily. 30 tablet 4  . hydrALAZINE (APRESOLINE) 50 MG tablet Take 1 tablet (50 mg total) by mouth 3 (three) times daily. 90 tablet 4  . traMADol (ULTRAM) 50 MG tablet Take 1 tablet (50 mg total) by mouth every 12 (twelve) hours as needed for moderate pain or severe pain. 30 tablet 0  . UNABLE TO FIND Outpatient Physical and occupational therapy   Diagnosis: left sided vision loss, optic neuritis 1 Mutually Defined 0   No current facility-administered medications on file prior to visit.    Allergies:  Allergies  Allergen Reactions  . Penicillins     Convulsions    Family History: Family History  Problem Relation Age of Onset  . Hypertension Mother     Living  . Hypertension Brother   . Kidney disease Mother   . Kidney disease Brother   . Hypertension Father   . Hypertension Father     Deceased, 13  . Hypertension Sister   . Ulcers Father     Social History: History   Social History  . Marital Status: Single    Spouse Name: N/A  . Number of Children: N/A  . Years of Education: N/A   Occupational History  . Not on file.   Social History Main Topics  . Smoking status: Former Smoker --  0.30 packs/day for 20 years    Types: Cigarettes  . Smokeless tobacco: Never Used     Comment: 2015  . Alcohol Use: 0.0 oz/week    0 Standard drinks or equivalent per week     Comment: Quart of liquor (moonshine) per week since the age of 72  . Drug Use: Yes    Special: Cocaine, Marijuana     Comment: since fall 2015  . Sexual Activity: Not on file   Other Topics Concern  . Not on file   Social History Narrative   Lives with niece in a 2 story home.     On disability since 2015 for low back pain.  Used to work Statistician.    Highest level of education: 11th grade    Review of Systems:  CONSTITUTIONAL: No fevers, chills, night sweats, or weight loss.   EYES: +visual changes or eye pain ENT: No hearing changes.  No history of nose bleeds.   RESPIRATORY: No cough, wheezing and shortness of breath.   CARDIOVASCULAR: Negative for chest pain, and palpitations.   GI: Negative for abdominal discomfort, blood in stools or  black stools.  No recent change in bowel habits.   GU:  No history of incontinence.   MUSCLOSKELETAL: +history of joint pain or swelling.  No myalgias.   SKIN: Negative for lesions, rash, and itching.   HEMATOLOGY/ONCOLOGY: Negative for prolonged bleeding, bruising easily, and swollen nodes.  No history of cancer.   ENDOCRINE: Negative for cold or heat intolerance, polydipsia or goiter.   PSYCH:  No depression or anxiety symptoms.   NEURO: As Above.   Vital Signs:  BP 130/78 mmHg  Pulse 84  Ht '5\' 8"'  (1.727 m)  Wt 270 lb 9 oz (122.726 kg)  BMI 41.15 kg/m2  SpO2 95% Pain Scale: 3 on a scale of 0-10   General Medical Exam:   General:  Slightly dishelved, poorly groomed, comfortable.   Eyes/ENT: see cranial nerve examination.  Injected conjunctiva bilaterally Neck: No masses appreciated.  Full range of motion without tenderness.  No carotid bruits. Respiratory:  Clear to auscultation, good air entry bilaterally.   Cardiac:  Regular rate and rhythm, no  murmur.   Extremities:  No edema, or skin discoloration.  Skin:  No rashes or lesions.  Neurological Exam: MENTAL STATUS including orientation to time, place, person, recent and remote memory, attention span and concentration, language, and fund of knowledge is normal.  Speech is not dysarthric.  CRANIAL NERVES: II:  Right eye with normal visual field testing, left eye with intact confrontation only in the left upper temporal quadrant.  Able to visualize objects but there is evidence of red color desaturation on the left.  Unremarkable fundi.   III-IV-VI: Left APD, right pupil equal round and reactive to light.  Normal conjugate, extra-ocular eye movements in all directions of gaze.  No nystagmus.  No ptosis.   V:  Normal facial sensation.     VII:  Normal facial symmetry and movements.    VIII:  Normal hearing and vestibular function.   IX-X:  Normal palatal movement.   XI:  Normal shoulder shrug and head rotation.   XII:  Normal tongue strength and range of motion, no deviation or fasciculation.  MOTOR:  No atrophy, fasciculations or abnormal movements.  No pronator drift.  Tone is normal.    Right Upper Extremity:    Left Upper Extremity:    Deltoid  5/5   Deltoid  5/5   Biceps  5/5   Biceps  5/5   Triceps  5/5   Triceps  5/5   Wrist extensors  5/5   Wrist extensors  5/5   Wrist flexors  5/5   Wrist flexors  5/5   Finger extensors  5/5   Finger extensors  5/5   Finger flexors  5/5   Finger flexors  5/5   Dorsal interossei  5/5   Dorsal interossei  5/5   Abductor pollicis  5/5   Abductor pollicis  5/5   Tone (Ashworth scale)  0  Tone (Ashworth scale)  0   Right Lower Extremity:    Left Lower Extremity:    Hip flexors  5/5   Hip flexors  5-/5   Hip extensors  5/5   Hip extensors  5/5   Knee flexors  5/5   Knee flexors  5/5   Knee extensors  5/5   Knee extensors  5/5   Dorsiflexors  5/5   Dorsiflexors  5/5   Plantarflexors  5/5   Plantarflexors  5/5   Toe extensors  5/5   Toe  extensors  5/5  Toe flexors  5/5   Toe flexors  5/5   Tone (Ashworth scale)  0  Tone (Ashworth scale)  0   MSRs:  Right                                                                 Left brachioradialis 2+  brachioradialis 2+  biceps 2+  biceps 2+  triceps 2+  triceps 2+  patellar 2+  patellar 3+  ankle jerk 1+  ankle jerk 1+  Hoffman no  Hoffman no  plantar response down  plantar response up   SENSORY:  Normal and symmetric perception of light touch, pinprick, vibration, and proprioception.  Romberg's sign absent.   COORDINATION/GAIT: Normal finger-to- nose-finger and heel-to-shin.  Intact rapid alternating movements bilaterally.  Unable to rise from a chair without using arms.  Gait slightly wide-based, antalgic, and stable.  He is unsteady with tandem gait and unable to perform stressed gait.  IMPRESSION: Mr. Ly is a 59 year-old gentleman presenting for evaluation of left optic neuritis which has slowly shown some improvement since completed a course of methylprednisonlone.  He continues to have peripheral field loss and color desaturation.  I have personally review his work-up, including imaging, CSF, and serology testing.  There is evidence of left optic nerve enhancement suggestive of optic neuritis and abnormal thalamic signal.  His white matter changes are non-enhancing and less likely to represent demyelinating changes, in my opinion.  With his history, it is more likely that patient is suffering from neurotoxicity from additives from the moonshine that he consumed.  He is unsure of what alcohol was used, but methanol and ethylene glycol are known causes of toxic optic neuropathy.  First episode of demyelinating disease also needs to be considered and I would like to repeat his imaging in 3 months to see if there is evolution of disease.  Alcohol, cocaine, and marijuana cessation was strongly encouraged.    His exam also discloses gait instability, mild left leg weakness and  spasticity, and asymmetrically brisk patella knee jerk.  He may have lumbar canal stenosis based on his myleopathic findings, therefore would like to obtain imaging of his lumbar spine.  Other possibilities include nutrient deficiency, but this would be symmetric on exam.  Finally, there are several medical findings that came up during his recent hospitalization which patient was previously unaware of including:  (1) renal dysfunction, (2) mild transaminitis, (3) hepatitis C.  He has not seen a PCP in over 20 years and I stressed the importance of establishing care with a primary care provider as these medical issues need close follow-up.   PLAN/RECOMMENDATIONS:  1.  Referral to internal medical to establish primary care provider for management of other medical problems, he may also need ID referral but will defer this to PCP 2.  MRI lumbar spine wo contrast 3.  Check vitamin B12, vitamin E, folate 4.  Strongly encouraged to stop drinking alcohol and drug use 5.  Opthalmology evaluation for formal visual field testing 6.  Repeat MRI brain in 3 months 7.  Return to clinic in 6 weeks  The duration of this appointment visit was 65 minutes of face-to-face time with the patient.  Greater than 50% of this time was spent in counseling, explanation  of diagnosis, planning of further management, and coordination of care.   Thank you for allowing me to participate in patient's care.  If I can answer any additional questions, I would be pleased to do so.    Sincerely,    Jerzi Tigert K. Posey Pronto, DO

## 2014-07-26 ENCOUNTER — Encounter (HOSPITAL_COMMUNITY): Payer: Self-pay | Admitting: Emergency Medicine

## 2014-07-26 ENCOUNTER — Emergency Department (HOSPITAL_COMMUNITY): Payer: Medicaid Other

## 2014-07-26 ENCOUNTER — Inpatient Hospital Stay (HOSPITAL_COMMUNITY)
Admission: EM | Admit: 2014-07-26 | Discharge: 2014-07-28 | DRG: 948 | Disposition: A | Discharge status: Home / Self Care | Payer: Medicaid Other | Attending: Internal Medicine | Admitting: Internal Medicine

## 2014-07-26 DIAGNOSIS — R188 Other ascites: Principal | ICD-10-CM | POA: Diagnosis present

## 2014-07-26 DIAGNOSIS — R1084 Generalized abdominal pain: Secondary | ICD-10-CM | POA: Diagnosis present

## 2014-07-26 DIAGNOSIS — B1920 Unspecified viral hepatitis C without hepatic coma: Secondary | ICD-10-CM | POA: Diagnosis present

## 2014-07-26 DIAGNOSIS — F1010 Alcohol abuse, uncomplicated: Secondary | ICD-10-CM | POA: Diagnosis present

## 2014-07-26 DIAGNOSIS — Z87891 Personal history of nicotine dependence: Secondary | ICD-10-CM

## 2014-07-26 DIAGNOSIS — K7460 Unspecified cirrhosis of liver: Secondary | ICD-10-CM | POA: Diagnosis not present

## 2014-07-26 DIAGNOSIS — I10 Essential (primary) hypertension: Secondary | ICD-10-CM | POA: Diagnosis present

## 2014-07-26 DIAGNOSIS — Z880 Allergy status to penicillin: Secondary | ICD-10-CM

## 2014-07-26 DIAGNOSIS — E669 Obesity, unspecified: Secondary | ICD-10-CM | POA: Diagnosis present

## 2014-07-26 DIAGNOSIS — Z6838 Body mass index (BMI) 38.0-38.9, adult: Secondary | ICD-10-CM

## 2014-07-26 DIAGNOSIS — R52 Pain, unspecified: Secondary | ICD-10-CM

## 2014-07-26 DIAGNOSIS — M549 Dorsalgia, unspecified: Secondary | ICD-10-CM | POA: Diagnosis present

## 2014-07-26 DIAGNOSIS — H468 Other optic neuritis: Secondary | ICD-10-CM | POA: Diagnosis present

## 2014-07-26 DIAGNOSIS — F1910 Other psychoactive substance abuse, uncomplicated: Secondary | ICD-10-CM | POA: Diagnosis present

## 2014-07-26 DIAGNOSIS — Z8673 Personal history of transient ischemic attack (TIA), and cerebral infarction without residual deficits: Secondary | ICD-10-CM

## 2014-07-26 DIAGNOSIS — I509 Heart failure, unspecified: Secondary | ICD-10-CM | POA: Diagnosis present

## 2014-07-26 DIAGNOSIS — K760 Fatty (change of) liver, not elsewhere classified: Secondary | ICD-10-CM | POA: Diagnosis present

## 2014-07-26 DIAGNOSIS — Z7982 Long term (current) use of aspirin: Secondary | ICD-10-CM

## 2014-07-26 DIAGNOSIS — H547 Unspecified visual loss: Secondary | ICD-10-CM | POA: Diagnosis present

## 2014-07-26 DIAGNOSIS — G8929 Other chronic pain: Secondary | ICD-10-CM | POA: Diagnosis present

## 2014-07-26 HISTORY — DX: Heart failure, unspecified: I50.9

## 2014-07-26 LAB — COMPREHENSIVE METABOLIC PANEL
ALBUMIN: 2.7 g/dL — AB (ref 3.5–5.2)
ALK PHOS: 150 U/L — AB (ref 39–117)
ALT: 47 U/L (ref 0–53)
ANION GAP: 9 (ref 5–15)
AST: 94 U/L — AB (ref 0–37)
BUN: 6 mg/dL (ref 6–23)
CALCIUM: 8.9 mg/dL (ref 8.4–10.5)
CO2: 23 mmol/L (ref 19–32)
Chloride: 102 mmol/L (ref 96–112)
Creatinine, Ser: 1.54 mg/dL — ABNORMAL HIGH (ref 0.50–1.35)
GFR calc non Af Amer: 48 mL/min — ABNORMAL LOW (ref >90)
GFR, EST AFRICAN AMERICAN: 56 mL/min — AB (ref >90)
Glucose, Bld: 89 mg/dL (ref 70–99)
POTASSIUM: 4 mmol/L (ref 3.5–5.1)
SODIUM: 134 mmol/L — AB (ref 135–145)
Total Bilirubin: 1.8 mg/dL — ABNORMAL HIGH (ref 0.3–1.2)
Total Protein: 7.9 g/dL (ref 6.0–8.3)

## 2014-07-26 LAB — URINALYSIS, ROUTINE W REFLEX MICROSCOPIC
Glucose, UA: NEGATIVE mg/dL
Hgb urine dipstick: NEGATIVE
KETONES UR: NEGATIVE mg/dL
Leukocytes, UA: NEGATIVE
NITRITE: NEGATIVE
PROTEIN: NEGATIVE mg/dL
Specific Gravity, Urine: 1.016 (ref 1.005–1.030)
UROBILINOGEN UA: 1 mg/dL (ref 0.0–1.0)
pH: 5 (ref 5.0–8.0)

## 2014-07-26 LAB — CBC WITH DIFFERENTIAL/PLATELET
BASOS PCT: 0 % (ref 0–1)
Basophils Absolute: 0 10*3/uL (ref 0.0–0.1)
EOS PCT: 0 % (ref 0–5)
Eosinophils Absolute: 0 10*3/uL (ref 0.0–0.7)
HCT: 34.7 % — ABNORMAL LOW (ref 39.0–52.0)
HEMOGLOBIN: 12 g/dL — AB (ref 13.0–17.0)
LYMPHS ABS: 1.7 10*3/uL (ref 0.7–4.0)
Lymphocytes Relative: 34 % (ref 12–46)
MCH: 29.1 pg (ref 26.0–34.0)
MCHC: 34.6 g/dL (ref 30.0–36.0)
MCV: 84.2 fL (ref 78.0–100.0)
MONO ABS: 1 10*3/uL (ref 0.1–1.0)
Monocytes Relative: 21 % — ABNORMAL HIGH (ref 3–12)
NEUTROS ABS: 2.2 10*3/uL (ref 1.7–7.7)
NEUTROS PCT: 45 % (ref 43–77)
Platelets: 129 10*3/uL — ABNORMAL LOW (ref 150–400)
RBC: 4.12 MIL/uL — AB (ref 4.22–5.81)
RDW: 13.4 % (ref 11.5–15.5)
WBC: 4.9 10*3/uL (ref 4.0–10.5)

## 2014-07-26 LAB — PROTIME-INR
INR: 1.32 (ref 0.00–1.49)
Prothrombin Time: 16.5 seconds — ABNORMAL HIGH (ref 11.6–15.2)

## 2014-07-26 LAB — LIPASE, BLOOD: Lipase: 49 U/L (ref 11–59)

## 2014-07-26 MED ORDER — IOHEXOL 300 MG/ML  SOLN
100.0000 mL | Freq: Once | INTRAMUSCULAR | Status: AC | PRN
  Administered 2014-07-26: 100 mL via INTRAVENOUS

## 2014-07-26 MED ORDER — HYDROMORPHONE HCL 1 MG/ML IJ SOLN
1.0000 mg | Freq: Once | INTRAMUSCULAR | Status: AC
  Administered 2014-07-26: 1 mg via INTRAVENOUS
  Filled 2014-07-26: qty 1

## 2014-07-26 MED ORDER — IOHEXOL 300 MG/ML  SOLN
25.0000 mL | INTRAMUSCULAR | Status: AC
  Administered 2014-07-26: 25 mL via ORAL

## 2014-07-26 NOTE — ED Notes (Signed)
Pt c/o upper abd pain and bloating; pt poor historian

## 2014-07-26 NOTE — H&P (Signed)
[facility] History and Physical  Patient: John [name]  MRN: [id number]  DOB: 08-06-1955  DOS: the patient was seen and examined on 07/26/2014 PCP: No PCP Per Patient  Chief Complaint: Abdominal pain  HPI: John [name] is a 59 y.o. male with Past medical history of hypertension, polysubstance abuse, optic neuropathy on the left eye, CHF. The patient is presenting with complaints of abdominal pain. The patient mentions since last one week he has been having progressively worsening abdominal pain with distention. He also had trouble breathing with that. Denies any chest pain. Complains of nausea without any vomiting. Denies any constipation or diarrhea. Also complains of burning urination. He mentions he is compliant with all his medications and has not been drinking alcohol or any other substance abuse since last one month.  The patient is coming from home. And at his baseline independent for most of his ADL.  Review of Systems: as mentioned in the history of present illness.  A Comprehensive review of the other systems is negative.  Past Medical History  Diagnosis Date  . Hypertension   . Chronic back pain   . Stroke 1998  . Polysubstance abuse   . Optic neuropathy, left   . CHF (congestive heart failure)    Past Surgical History  Procedure Laterality Date  . None     Social History:  reports that he has quit smoking. His smoking use included Cigarettes. He has a 6 pack-year smoking history. He has never used smokeless tobacco. He reports that he drinks alcohol. He reports that he uses illicit drugs (Cocaine and Marijuana).  Allergies  Allergen Reactions  . Penicillins     Convulsions    Family History  Problem Relation Age of Onset  . Hypertension Mother     Living  . Hypertension Brother   . Kidney disease Mother   . Kidney disease Brother   . Hypertension Father   . Hypertension Father     Deceased, 54  . Hypertension Sister   . Ulcers Father      Prior to Admission medications   Medication Sig Start Date End Date Taking? Authorizing Provider  amLODipine (NORVASC) 10 MG tablet Take 1 tablet (10 mg total) by mouth daily. 06/27/14  Yes Ripudeep Krystal Eaton, MD  aspirin EC 325 MG EC tablet Take 1 tablet (325 mg total) by mouth daily. 06/27/14  Yes Ripudeep Krystal Eaton, MD  hydrALAZINE (APRESOLINE) 50 MG tablet Take 1 tablet (50 mg total) by mouth 3 (three) times daily. 06/27/14  Yes Ripudeep Krystal Eaton, MD  Naproxen Sodium (ALEVE PO) Take by mouth.   Yes Historical Provider, MD  traMADol (ULTRAM) 50 MG tablet Take 1 tablet (50 mg total) by mouth every 12 (twelve) hours as needed for moderate pain or severe pain. 06/27/14  Yes Ripudeep Krystal Eaton, MD  UNABLE TO FIND Outpatient Physical and occupational therapy   Diagnosis: left sided vision loss, optic neuritis 06/29/14  Yes Ripudeep Krystal Eaton, MD    Physical Exam: Filed Vitals:   07/26/14 2045 07/26/14 2245 07/26/14 2300 07/26/14 2315  BP: 125/68 132/88 125/73 110/62  Pulse: 86 82 86 85  Temp:      TempSrc:      Resp: 18 16    Height:      Weight:      SpO2: 95% 95% 92% 93%    General: Alert, Awake and Oriented to Time, Place and Person. Appear in mild distress Eyes: PERRL ENT: Oral Mucosa clear moist. Neck: no  JVD Cardiovascular: S1 and S2 Present, no Murmur, Peripheral Pulses Present Respiratory: Bilateral Air entry equal and Decreased, Clear to Auscultation, noCrackles, no wheezes Abdomen: Bowel Sound present, Soft and distended and diffusely tender Skin: no Rash Extremities: trace Pedal edema, no calf tenderness Neurologic: Grossly no focal neuro deficit.  Labs on Admission:  CBC:  Recent Labs Lab 07/26/14 1633  WBC 4.9  NEUTROABS 2.2  HGB 12.0*  HCT 34.7*  MCV 84.2  PLT 129*    CMP     Component Value Date/Time   NA 134* 07/26/2014 1633   K 4.0 07/26/2014 1633   CL 102 07/26/2014 1633   CO2 23 07/26/2014 1633   GLUCOSE 89 07/26/2014 1633   BUN 6 07/26/2014 1633    CREATININE 1.54* 07/26/2014 1633   CALCIUM 8.9 07/26/2014 1633   PROT 7.9 07/26/2014 1633   ALBUMIN 2.7* 07/26/2014 1633   AST 94* 07/26/2014 1633   ALT 47 07/26/2014 1633   ALKPHOS 150* 07/26/2014 1633   BILITOT 1.8* 07/26/2014 1633   GFRNONAA 48* 07/26/2014 1633   GFRAA 56* 07/26/2014 1633     Recent Labs Lab 07/26/14 1633  LIPASE 49    No results for input(s): CKTOTAL, CKMB, CKMBINDEX, TROPONINI in the last 168 hours. BNP (last 3 results)  Recent Labs  06/24/14 0802  BNP 1168.5*    ProBNP (last 3 results) No results for input(s): PROBNP in the last 8760 hours.   Radiological Exams on Admission: Ct Abdomen Pelvis W Contrast  07/26/2014   CLINICAL DATA:  Diffuse abdominal pain for 2 days  EXAM: CT ABDOMEN AND PELVIS WITH CONTRAST  TECHNIQUE: Multidetector CT imaging of the abdomen and pelvis was performed using the standard protocol following bolus administration of intravenous contrast.  CONTRAST:  111mL OMNIPAQUE IOHEXOL 300 MG/ML  SOLN  COMPARISON:  None.  FINDINGS: Lung bases are unremarkable.  Small hiatal hernia.  Sagittal images of the spine shows degenerative changes thoracolumbar spine. Significant disc space flattening with vacuum disc phenomenon at L4-L5 and L5-S1 level.  There is perihepatic and perisplenic ascites. Small ascites is noted in left paracolic gutter. Moderate ascites is noted in right paracolic gutter. Markedly nodular contour of the liver and mild hepatic atrophy consistent with cirrhosis. No focal hepatic mass. No calcified gallstones are noted within gallbladder. The spleen measures 12.5 cm in length. There are varices in proximal perigastric region. Portal vein measures 1.4 cm in diameter. Findings are suspicious for portal hypertension. There is small umbilical hernia containing fat and small bowel lobe without evidence of acute complication. Subcutaneous edema is noted in umbilical and periumbilical region. Minimal subcutaneous edema bilateral flank  wall  Oral contrast material was given to the patient. No small bowel obstruction. No ascites or free air. No adenopathy. The pancreas and adrenal glands are unremarkable. Kidneys are symmetrical in size and enhancement. No hydronephrosis or hydroureter. Mild atherosclerotic calcifications of abdominal aorta and iliac arteries. No aortic aneurysm.  Moderate pelvic ascites. Urinary bladder is unremarkable. Prostate gland and seminal vesicles are unremarkable. Small nonspecific bilateral inguinal lymph nodes. No destructive bony lesions are noted within pelvis.  Delayed renal images shows mild delay excretion bilaterally.  IMPRESSION: 1. There is moderate perihepatic and perisplenic ascites. Mild hepatic atrophy with nodular contour of the liver highly suspicious for cirrhosis. Small varices are noted in perigastric and GE junction region. Portal vein measures 1.4 cm in diameter suspicious for portal hypertension. 2. Borderline splenomegaly with spleen measuring 12.5 cm in length. 3. No hydronephrosis or  hydroureter. Mild delayed excretion bilateral kidneys. 4. Moderate ascites is noted in right paracolic gutter. Moderate pelvic ascites. 5. Small umbilical hernia containing fat and small bowel loop without evidence of acute complication. No small bowel obstruction. Mild subcutaneous edema umbilical and periumbilical region.   Electronically Signed   By: Lahoma Crocker M.D.   On: 07/26/2014 22:16    Assessment/Plan Principal Problem:   Abdominal pain, generalized Active Problems:   Essential hypertension, benign   Polysubstance abuse   Ascites   Cirrhosis   1. Abdominal pain, generalized The patient is presenting with numbness of abdominal pain along with distention. A CT of the abdomen was performed which shows significant ascites. It also shows evidence of portal hypertension as well as possible viruses. Also shows a ptosis of the liver. Although this is likely secondary to chronic medicines abuse and  alcohol abuse. Currently the patient will be admitted in the hospital for observation for pain management. We will continue with OxyContin. As needed. Ultrasound-guided paracentesis will be performed in the morning. It due to presence of abdominal pain as well as ascites the patient was admitted for possible SBP prophylaxis with ciprofloxacin. Further consultation and treatment for cirrhosis depending on the findings of the ascites fluid.  2. History of alcohol abuse. Patient denies any alcohol abuse in the last 1 month. He will be closely monitored for any signs of withdrawal.  3. Essential hypertension. Blood pressure mildly elevated. Continuing home medications.  Advance goals of care discussion: full code  DVT Prophylaxis: subcutaneous Heparin. Nutrition: npo  Disposition: Admitted to observationin med-surge unit.  Author: Berle Mull, MD [facility] Pager: [phone] 07/26/2014, 11:41 PM    If 7PM-7AM, please contact night-coverage [email or web address] Password [email or web address]

## 2014-07-26 NOTE — ED Notes (Signed)
The pt has had abd pain for 2 days and he reports that he has knots in his stomach since  Saturday.  Nausea no vomiting.  Last bm was 1200n today.  He reports that his abd has been swollen since the knots started on Saturday,

## 2014-07-26 NOTE — ED Notes (Signed)
Pt done drinking contrast, CT notified.

## 2014-07-26 NOTE — ED Notes (Signed)
Pt is aware urine is needed for testing, pt is unable to urinate at this time.  

## 2014-07-26 NOTE — ED Provider Notes (Addendum)
CSN: [id number]     Arrival date & time 07/26/14  1610 History   First MD Initiated Contact with Patient 07/26/14 1906     Chief Complaint  Patient presents with  . Abdominal Pain     (Consider location/radiation/quality/duration/timing/severity/associated sxs/prior Treatment) HPI Complains of abdominal pain, diffuse but worse at the periumbilical area and abdominal swelling onset 2 days ago. Admits to diminished appetite last bowel movement 12 noon today normal. No urinary symptoms. Nothing makes symptoms better or worse. No known fever no chest pain. Other associated symptoms include bilateral leg swelling for the past 2-3 days. Denies chest pain no treatment prior to coming here. Lying still makes abdominal pain better pain is made worse with movement or changing position Past Medical History  Diagnosis Date  . Hypertension   . Chronic back pain   . Stroke 1998  . Polysubstance abuse   . Optic neuropathy, left    Past Surgical History  Procedure Laterality Date  . None     Family History  Problem Relation Age of Onset  . Hypertension Mother     Living  . Hypertension Brother   . Kidney disease Mother   . Kidney disease Brother   . Hypertension Father   . Hypertension Father     Deceased, 65  . Hypertension Sister   . Ulcers Father    History  Substance Use Topics  . Smoking status: Former Smoker -- 0.30 packs/day for 20 years    Types: Cigarettes  . Smokeless tobacco: Never Used     Comment: 2015  . Alcohol Use: 0.0 oz/week    0 Standard drinks or equivalent per week     Comment: Quart of liquor (moonshine) per week since the age of 53    Review of Systems  Constitutional: Positive for appetite change.  HENT: Negative.   Respiratory: Positive for shortness of breath.        ComPlains of mild dyspnea  Cardiovascular: Positive for leg swelling.  Gastrointestinal: Positive for abdominal pain and abdominal distention.  Musculoskeletal: Negative.   Skin:  Negative.   Neurological: Negative.   Psychiatric/Behavioral: Negative.       Allergies  Penicillins  Home Medications   Prior to Admission medications   Medication Sig Start Date End Date Taking? Authorizing Provider  amLODipine (NORVASC) 10 MG tablet Take 1 tablet (10 mg total) by mouth daily. 06/27/14   Ripudeep Krystal Eaton, MD  aspirin EC 325 MG EC tablet Take 1 tablet (325 mg total) by mouth daily. 06/27/14   Ripudeep Krystal Eaton, MD  hydrALAZINE (APRESOLINE) 50 MG tablet Take 1 tablet (50 mg total) by mouth 3 (three) times daily. 06/27/14   Ripudeep Krystal Eaton, MD  Naproxen Sodium (ALEVE PO) Take by mouth.    Historical Provider, MD  traMADol (ULTRAM) 50 MG tablet Take 1 tablet (50 mg total) by mouth every 12 (twelve) hours as needed for moderate pain or severe pain. 06/27/14   Ripudeep Krystal Eaton, MD  UNABLE TO FIND Outpatient Physical and occupational therapy   Diagnosis: left sided vision loss, optic neuritis 06/29/14   Ripudeep K Rai, MD   BP 134/79 mmHg  Pulse 82  Temp(Src) 98.3 F (36.8 C) (Oral)  Resp 18  Ht 5\' 10"  (1.778 m)  Wt 270 lb (122.471 kg)  BMI 38.74 kg/m2  SpO2 96% Physical Exam  Constitutional: He appears well-developed and well-nourished. He appears distressed.  Chronically ill-appearing. Appears uncomfortable Glasgow Coma Score 15  HENT:  Head: Normocephalic  and atraumatic.  Mucous members dry  Eyes: Conjunctivae are normal. Pupils are equal, round, and reactive to light.  Neck: Neck supple. No tracheal deviation present. No thyromegaly present.  Cardiovascular: Normal rate and regular rhythm.   No murmur heard. Pulmonary/Chest: Effort normal and breath sounds normal.  Abdominal: Soft. Bowel sounds are normal. He exhibits distension. There is tenderness.  Diffusely tender worse at umbilicus. No obvious hernia.  Genitourinary: Penis normal.  Normal male genitalia  Musculoskeletal: Normal range of motion. He exhibits no edema or tenderness.  Neurological: He is alert.  Coordination normal.  Skin: Skin is warm and dry. No rash noted.  Psychiatric: He has a normal mood and affect.  Nursing note and vitals reviewed.   ED Course  Procedures (including critical care time) Labs Review Labs Reviewed  CBC WITH DIFFERENTIAL/PLATELET - Abnormal; Notable for the following:    RBC 4.12 (*)    Hemoglobin 12.0 (*)    HCT 34.7 (*)    Platelets 129 (*)    Monocytes Relative 21 (*)    All other components within normal limits  COMPREHENSIVE METABOLIC PANEL - Abnormal; Notable for the following:    Sodium 134 (*)    Creatinine, Ser 1.54 (*)    Albumin 2.7 (*)    AST 94 (*)    Alkaline Phosphatase 150 (*)    Total Bilirubin 1.8 (*)    GFR calc non Af Amer 48 (*)    GFR calc Af Amer 56 (*)    All other components within normal limits  LIPASE, BLOOD    Imaging Review No results found.   EKG Interpretation None     9:35 PM pain improved however requesting more pain medicine. Additional IV hydromorphone ordered. Results for orders placed or performed during the hospital encounter of 07/26/14  CBC with Differential  Result Value Ref Range   WBC 4.9 4.0 - 10.5 K/uL   RBC 4.12 (L) 4.22 - 5.81 MIL/uL   Hemoglobin 12.0 (L) 13.0 - 17.0 g/dL   HCT 34.7 (L) 39.0 - 52.0 %   MCV 84.2 78.0 - 100.0 fL   MCH 29.1 26.0 - 34.0 pg   MCHC 34.6 30.0 - 36.0 g/dL   RDW 13.4 11.5 - 15.5 %   Platelets 129 (L) 150 - 400 K/uL   Neutrophils Relative % 45 43 - 77 %   Neutro Abs 2.2 1.7 - 7.7 K/uL   Lymphocytes Relative 34 12 - 46 %   Lymphs Abs 1.7 0.7 - 4.0 K/uL   Monocytes Relative 21 (H) 3 - 12 %   Monocytes Absolute 1.0 0.1 - 1.0 K/uL   Eosinophils Relative 0 0 - 5 %   Eosinophils Absolute 0.0 0.0 - 0.7 K/uL   Basophils Relative 0 0 - 1 %   Basophils Absolute 0.0 0.0 - 0.1 K/uL  Comprehensive metabolic panel  Result Value Ref Range   Sodium 134 (L) 135 - 145 mmol/L   Potassium 4.0 3.5 - 5.1 mmol/L   Chloride 102 96 - 112 mmol/L   CO2 23 19 - 32 mmol/L    Glucose, Bld 89 70 - 99 mg/dL   BUN 6 6 - 23 mg/dL   Creatinine, Ser 1.54 (H) 0.50 - 1.35 mg/dL   Calcium 8.9 8.4 - 10.5 mg/dL   Total Protein 7.9 6.0 - 8.3 g/dL   Albumin 2.7 (L) 3.5 - 5.2 g/dL   AST 94 (H) 0 - 37 U/L   ALT 47 0 - 53 U/L  Alkaline Phosphatase 150 (H) 39 - 117 U/L   Total Bilirubin 1.8 (H) 0.3 - 1.2 mg/dL   GFR calc non Af Amer 48 (L) >90 mL/min   GFR calc Af Amer 56 (L) >90 mL/min   Anion gap 9 5 - 15  Lipase, blood  Result Value Ref Range   Lipase 49 11 - 59 U/L  Urinalysis, Routine w reflex microscopic  Result Value Ref Range   Color, Urine AMBER (A) YELLOW   APPearance CLEAR CLEAR   Specific Gravity, Urine 1.016 1.005 - 1.030   pH 5.0 5.0 - 8.0   Glucose, UA NEGATIVE NEGATIVE mg/dL   Hgb urine dipstick NEGATIVE NEGATIVE   Bilirubin Urine SMALL (A) NEGATIVE   Ketones, ur NEGATIVE NEGATIVE mg/dL   Protein, ur NEGATIVE NEGATIVE mg/dL   Urobilinogen, UA 1.0 0.0 - 1.0 mg/dL   Nitrite NEGATIVE NEGATIVE   Leukocytes, UA NEGATIVE NEGATIVE  Protime-INR  Result Value Ref Range   Prothrombin Time 16.5 (H) 11.6 - 15.2 seconds   INR 1.32 0.00 - 1.49   Dg Eye Foreign Body  07/11/2014   CLINICAL DATA:  Metal working/exposure; clearance prior to MRI  EXAM: ORBITS FOR FOREIGN BODY - 2 VIEW  COMPARISON:  None.  FINDINGS: No radiopaque foreign body.  Ok to proceed to MRI.  IMPRESSION: No radiopaque foreign body.  Ok to proceed to MRI.   Electronically Signed   By: Sandi Mariscal M.D.   On: 07/11/2014 10:47   Ct Abdomen Pelvis W Contrast  07/26/2014   CLINICAL DATA:  Diffuse abdominal pain for 2 days  EXAM: CT ABDOMEN AND PELVIS WITH CONTRAST  TECHNIQUE: Multidetector CT imaging of the abdomen and pelvis was performed using the standard protocol following bolus administration of intravenous contrast.  CONTRAST:  181mL OMNIPAQUE IOHEXOL 300 MG/ML  SOLN  COMPARISON:  None.  FINDINGS: Lung bases are unremarkable.  Small hiatal hernia.  Sagittal images of the spine shows  degenerative changes thoracolumbar spine. Significant disc space flattening with vacuum disc phenomenon at L4-L5 and L5-S1 level.  There is perihepatic and perisplenic ascites. Small ascites is noted in left paracolic gutter. Moderate ascites is noted in right paracolic gutter. Markedly nodular contour of the liver and mild hepatic atrophy consistent with cirrhosis. No focal hepatic mass. No calcified gallstones are noted within gallbladder. The spleen measures 12.5 cm in length. There are varices in proximal perigastric region. Portal vein measures 1.4 cm in diameter. Findings are suspicious for portal hypertension. There is small umbilical hernia containing fat and small bowel lobe without evidence of acute complication. Subcutaneous edema is noted in umbilical and periumbilical region. Minimal subcutaneous edema bilateral flank wall  Oral contrast material was given to the patient. No small bowel obstruction. No ascites or free air. No adenopathy. The pancreas and adrenal glands are unremarkable. Kidneys are symmetrical in size and enhancement. No hydronephrosis or hydroureter. Mild atherosclerotic calcifications of abdominal aorta and iliac arteries. No aortic aneurysm.  Moderate pelvic ascites. Urinary bladder is unremarkable. Prostate gland and seminal vesicles are unremarkable. Small nonspecific bilateral inguinal lymph nodes. No destructive bony lesions are noted within pelvis.  Delayed renal images shows mild delay excretion bilaterally.  IMPRESSION: 1. There is moderate perihepatic and perisplenic ascites. Mild hepatic atrophy with nodular contour of the liver highly suspicious for cirrhosis. Small varices are noted in perigastric and GE junction region. Portal vein measures 1.4 cm in diameter suspicious for portal hypertension. 2. Borderline splenomegaly with spleen measuring 12.5 cm in length. 3.  No hydronephrosis or hydroureter. Mild delayed excretion bilateral kidneys. 4. Moderate ascites is noted in  right paracolic gutter. Moderate pelvic ascites. 5. Small umbilical hernia containing fat and small bowel loop without evidence of acute complication. No small bowel obstruction. Mild subcutaneous edema umbilical and periumbilical region.   Electronically Signed   By: Lahoma Crocker M.D.   On: 07/26/2014 22:16    MDM  Patient mildly dyspneic and pain is severe. Feel the patient needs therapeutic paracentesis, preferably ultrasound-guided Spoke with Dr. Posey Pronto Plan 23 hour sedation MedSurg floor. Patient should be watched for alcohol withdrawal Final diagnoses:  None   Dx #1 abdominal pain #2 ascites #3 elevated lfts #4 hyperbilirubinemia #5 thrombocytopenia #6 coagulopathy #7 renal insufficiency     Orlie Dakin, MD 07/26/14 2322  Orlie Dakin, MD 07/26/14 2325

## 2014-07-27 ENCOUNTER — Encounter (HOSPITAL_COMMUNITY): Payer: Self-pay | Admitting: *Deleted

## 2014-07-27 ENCOUNTER — Observation Stay (HOSPITAL_COMMUNITY): Payer: Medicaid Other

## 2014-07-27 DIAGNOSIS — F1910 Other psychoactive substance abuse, uncomplicated: Secondary | ICD-10-CM

## 2014-07-27 DIAGNOSIS — Z880 Allergy status to penicillin: Secondary | ICD-10-CM | POA: Diagnosis not present

## 2014-07-27 DIAGNOSIS — K7460 Unspecified cirrhosis of liver: Secondary | ICD-10-CM | POA: Diagnosis present

## 2014-07-27 DIAGNOSIS — G8929 Other chronic pain: Secondary | ICD-10-CM | POA: Diagnosis present

## 2014-07-27 DIAGNOSIS — I10 Essential (primary) hypertension: Secondary | ICD-10-CM | POA: Diagnosis present

## 2014-07-27 DIAGNOSIS — F1010 Alcohol abuse, uncomplicated: Secondary | ICD-10-CM | POA: Diagnosis present

## 2014-07-27 DIAGNOSIS — R1084 Generalized abdominal pain: Secondary | ICD-10-CM | POA: Diagnosis not present

## 2014-07-27 DIAGNOSIS — R188 Other ascites: Secondary | ICD-10-CM | POA: Diagnosis present

## 2014-07-27 DIAGNOSIS — M549 Dorsalgia, unspecified: Secondary | ICD-10-CM | POA: Diagnosis present

## 2014-07-27 DIAGNOSIS — Z6838 Body mass index (BMI) 38.0-38.9, adult: Secondary | ICD-10-CM | POA: Diagnosis not present

## 2014-07-27 DIAGNOSIS — B1920 Unspecified viral hepatitis C without hepatic coma: Secondary | ICD-10-CM | POA: Diagnosis present

## 2014-07-27 DIAGNOSIS — Z8673 Personal history of transient ischemic attack (TIA), and cerebral infarction without residual deficits: Secondary | ICD-10-CM | POA: Diagnosis not present

## 2014-07-27 DIAGNOSIS — E669 Obesity, unspecified: Secondary | ICD-10-CM | POA: Diagnosis present

## 2014-07-27 DIAGNOSIS — I509 Heart failure, unspecified: Secondary | ICD-10-CM | POA: Diagnosis present

## 2014-07-27 DIAGNOSIS — Z7982 Long term (current) use of aspirin: Secondary | ICD-10-CM | POA: Diagnosis not present

## 2014-07-27 DIAGNOSIS — Z87891 Personal history of nicotine dependence: Secondary | ICD-10-CM | POA: Diagnosis not present

## 2014-07-27 DIAGNOSIS — K760 Fatty (change of) liver, not elsewhere classified: Secondary | ICD-10-CM | POA: Diagnosis present

## 2014-07-27 DIAGNOSIS — H468 Other optic neuritis: Secondary | ICD-10-CM | POA: Diagnosis present

## 2014-07-27 DIAGNOSIS — R109 Unspecified abdominal pain: Secondary | ICD-10-CM | POA: Diagnosis present

## 2014-07-27 DIAGNOSIS — H547 Unspecified visual loss: Secondary | ICD-10-CM | POA: Diagnosis present

## 2014-07-27 LAB — COMPREHENSIVE METABOLIC PANEL
ALK PHOS: 117 U/L (ref 39–117)
ALT: 39 U/L (ref 0–53)
ANION GAP: 4 — AB (ref 5–15)
AST: 79 U/L — ABNORMAL HIGH (ref 0–37)
Albumin: 2.3 g/dL — ABNORMAL LOW (ref 3.5–5.2)
BILIRUBIN TOTAL: 1.8 mg/dL — AB (ref 0.3–1.2)
BUN: 5 mg/dL — AB (ref 6–23)
CHLORIDE: 104 mmol/L (ref 96–112)
CO2: 26 mmol/L (ref 19–32)
CREATININE: 1.49 mg/dL — AB (ref 0.50–1.35)
Calcium: 8.2 mg/dL — ABNORMAL LOW (ref 8.4–10.5)
GFR calc Af Amer: 58 mL/min — ABNORMAL LOW (ref >90)
GFR calc non Af Amer: 50 mL/min — ABNORMAL LOW (ref >90)
GLUCOSE: 72 mg/dL (ref 70–99)
POTASSIUM: 4 mmol/L (ref 3.5–5.1)
Sodium: 134 mmol/L — ABNORMAL LOW (ref 135–145)
Total Protein: 6.9 g/dL (ref 6.0–8.3)

## 2014-07-27 LAB — CBC
HCT: 30.1 % — ABNORMAL LOW (ref 39.0–52.0)
Hemoglobin: 10.4 g/dL — ABNORMAL LOW (ref 13.0–17.0)
MCH: 29.3 pg (ref 26.0–34.0)
MCHC: 34.6 g/dL (ref 30.0–36.0)
MCV: 84.8 fL (ref 78.0–100.0)
Platelets: 124 10*3/uL — ABNORMAL LOW (ref 150–400)
RBC: 3.55 MIL/uL — ABNORMAL LOW (ref 4.22–5.81)
RDW: 13.5 % (ref 11.5–15.5)
WBC: 5.6 10*3/uL (ref 4.0–10.5)

## 2014-07-27 LAB — BODY FLUID CELL COUNT WITH DIFFERENTIAL
EOS FL: 0 %
LYMPHS FL: 11 %
Monocyte-Macrophage-Serous Fluid: 85 % (ref 50–90)
Neutrophil Count, Fluid: 4 % (ref 0–25)
WBC FLUID: 94 uL (ref 0–1000)

## 2014-07-27 LAB — ALBUMIN, FLUID (OTHER)

## 2014-07-27 LAB — GLUCOSE, SEROUS FLUID: Glucose, Fluid: 79 mg/dL

## 2014-07-27 LAB — PROTIME-INR
INR: 1.42 (ref 0.00–1.49)
PROTHROMBIN TIME: 17.5 s — AB (ref 11.6–15.2)

## 2014-07-27 LAB — LACTATE DEHYDROGENASE, PLEURAL OR PERITONEAL FLUID: LD FL: 33 U/L — AB (ref 3–23)

## 2014-07-27 LAB — PROTEIN, BODY FLUID

## 2014-07-27 MED ORDER — INFLUENZA VAC SPLIT QUAD 0.5 ML IM SUSY
0.5000 mL | PREFILLED_SYRINGE | INTRAMUSCULAR | Status: DC
  Filled 2014-07-27: qty 0.5

## 2014-07-27 MED ORDER — PNEUMOCOCCAL VAC POLYVALENT 25 MCG/0.5ML IJ INJ
0.5000 mL | INJECTION | INTRAMUSCULAR | Status: DC
  Filled 2014-07-27: qty 0.5

## 2014-07-27 MED ORDER — ACETAMINOPHEN 325 MG PO TABS: 650.0000 mg | ORAL_TABLET | Freq: Four times a day (QID) | ORAL | Status: DC | PRN

## 2014-07-27 MED ORDER — AMLODIPINE BESYLATE 10 MG PO TABS
10.0000 mg | ORAL_TABLET | Freq: Every day | ORAL | Status: DC
  Administered 2014-07-27 – 2014-07-28 (×2): 10 mg via ORAL
  Filled 2014-07-27 (×2): qty 1

## 2014-07-27 MED ORDER — HEPARIN SODIUM (PORCINE) 5000 UNIT/ML IJ SOLN
5000.0000 [IU] | Freq: Three times a day (TID) | INTRAMUSCULAR | Status: DC
  Administered 2014-07-27 – 2014-07-28 (×5): 5000 [IU] via SUBCUTANEOUS
  Filled 2014-07-27 (×6): qty 1

## 2014-07-27 MED ORDER — HYDRALAZINE HCL 50 MG PO TABS
50.0000 mg | ORAL_TABLET | Freq: Three times a day (TID) | ORAL | Status: DC
  Administered 2014-07-27 – 2014-07-28 (×5): 50 mg via ORAL
  Filled 2014-07-27 (×7): qty 1

## 2014-07-27 MED ORDER — FUROSEMIDE 10 MG/ML IJ SOLN
40.0000 mg | Freq: Two times a day (BID) | INTRAMUSCULAR | Status: DC
  Administered 2014-07-27 – 2014-07-28 (×2): 40 mg via INTRAVENOUS
  Filled 2014-07-27 (×4): qty 4

## 2014-07-27 MED ORDER — CIPROFLOXACIN IN D5W 400 MG/200ML IV SOLN
400.0000 mg | Freq: Two times a day (BID) | INTRAVENOUS | Status: DC
  Administered 2014-07-27 – 2014-07-28 (×4): 400 mg via INTRAVENOUS
  Filled 2014-07-27 (×5): qty 200

## 2014-07-27 MED ORDER — ONDANSETRON HCL 4 MG/2ML IJ SOLN: 4.0000 mg | Freq: Four times a day (QID) | INTRAMUSCULAR | Status: DC | PRN

## 2014-07-27 MED ORDER — OXYCODONE HCL 5 MG PO TABS
5.0000 mg | ORAL_TABLET | ORAL | Status: DC | PRN
  Administered 2014-07-27: 5 mg via ORAL
  Filled 2014-07-27: qty 1

## 2014-07-27 MED ORDER — ONDANSETRON HCL 4 MG PO TABS: 4.0000 mg | ORAL_TABLET | Freq: Four times a day (QID) | ORAL | Status: DC | PRN

## 2014-07-27 MED ORDER — LIDOCAINE HCL (PF) 1 % IJ SOLN
INTRAMUSCULAR | Status: AC
  Filled 2014-07-27: qty 10

## 2014-07-27 MED ORDER — ACETAMINOPHEN 650 MG RE SUPP: 650.0000 mg | Freq: Four times a day (QID) | RECTAL | Status: DC | PRN

## 2014-07-27 MED ORDER — ASPIRIN EC 325 MG PO TBEC
325.0000 mg | DELAYED_RELEASE_TABLET | Freq: Every day | ORAL | Status: DC
  Administered 2014-07-27 – 2014-07-28 (×2): 325 mg via ORAL
  Filled 2014-07-27 (×2): qty 1

## 2014-07-27 MED ORDER — PANTOPRAZOLE SODIUM 40 MG IV SOLR
40.0000 mg | INTRAVENOUS | Status: DC
  Administered 2014-07-27 – 2014-07-28 (×2): 40 mg via INTRAVENOUS
  Filled 2014-07-27 (×2): qty 40

## 2014-07-27 NOTE — Progress Notes (Signed)
ANTIBIOTIC CONSULT NOTE - INITIAL  Pharmacy Consult for Ceftriaxone  Indication: SBP Prophylaxis  Allergies  Allergen Reactions  . Penicillins     Convulsions    Patient Measurements: Height: 5\' 10"  (177.8 cm) Weight: 270 lb (122.471 kg) IBW/kg (Calculated) : 73  Vital Signs: Temp: 98.3 F (36.8 C) (03/29 1853) Temp Source: Oral (03/29 1853) BP: 126/106 mmHg (03/29 2330) Pulse Rate: 94 (03/29 2330)  Labs:  Recent Labs  07/26/14 1633  WBC 4.9  HGB 12.0*  PLT 129*  CREATININE 1.54*   Estimated Creatinine Clearance: 69.5 mL/min (by C-G formula based on Cr of 1.54).  Medical History: Past Medical History  Diagnosis Date  . Hypertension   . Chronic back pain   . Stroke 1998  . Polysubstance abuse   . Optic neuropathy, left   . CHF (congestive heart failure)     Assessment: 59 y/o M with abdominal pain, starting Ceftriaxone for SBP Prophylaxis, WBC WNL, other labs as above.   Plan:  -Ceftriaxone 2g IV q24h -Trend WBC, temp  Delphin Funes 07/27/2014,12:21 AM

## 2014-07-27 NOTE — Procedures (Signed)
   US guided RUQ para 1.9 liter cloudy yellow fluid  Sent for labs per MD  Tolerated well

## 2014-07-27 NOTE — Progress Notes (Signed)
ANTIBIOTIC CONSULT NOTE - INITIAL  Pharmacy Consult for Cipro Indication: SBP prophylaxis  Allergies  Allergen Reactions  . Penicillins     Convulsions    Patient Measurements: Height: 5\' 10"  (177.8 cm) Weight: 273 lb (123.832 kg) IBW/kg (Calculated) : 73 Vital Signs: Temp: 97.8 F (36.6 C) (03/30 0028) Temp Source: Oral (03/30 0028) BP: 126/73 mmHg (03/30 0028) Pulse Rate: 85 (03/30 0028)  Labs:  Recent Labs  07/26/14 1633  WBC 4.9  HGB 12.0*  PLT 129*  CREATININE 1.54*   Estimated Creatinine Clearance: 69.8 mL/min (by C-G formula based on Cr of 1.54).  Medical History: Past Medical History  Diagnosis Date  . Hypertension   . Chronic back pain   . Stroke 1998  . Polysubstance abuse   . Optic neuropathy, left   . CHF (congestive heart failure)    Assessment: Admit complaint: abdominal pain -Starting Cipro for SBP prophylaxis -WBC WNL, mild bump in Scr  Plan:  -Cipro 400 mg IV q12h -Trend WBC, temp, renal function   Narda Bonds 07/27/2014,12:38 AM

## 2014-07-27 NOTE — Progress Notes (Signed)
[facility] PROGRESS NOTE  John [name] SEG:[id number] DOB: 01/14/1956 DOA: 07/26/2014 PCP: No PCP Per Patient  Assessment/Plan: 1. New onset ascites -Status post paracentesis and removal of 2 L of fluid -Analysis is pending cell count, protein, LDH, albumin, Gram stain and culture -Highly concerning from cirrhosis and portal hypertension -Continue with Lasix. -Start the patient on spironolactone and beta blocker -Educate the patient about fluid restriction, salt restriction  2. Cirrhosis -Seems to be multifactorial. Alcohol, nonalcoholic steatohepatosis, congestive heart failure and polysubstance abuse -Patient has long history of all called under polysubstance abuse -Get hepatitis panel, iron profile, copper, antinuclear antibodies -Echocardiogram to rule out any congestive heart failure  3. Alcohol abuse -Patient states last use was one month back -Keep the patient on thiamine -Closely monitored for any signs of withdrawal  4. Polysubstance abuse -History of IV drug. Abuse in the past -Consulate the patient  5. Hypertension -Continue with amlodipine  6.Obesity -Consult regarding diet and exercise  7.Congestive heart failure -Obtain echocardiogram -Secondary to alcohol abuse and polysubstance abuse -Continue with Lasix  Code Status: Full Family Communication: Patient Disposition Plan: Pending workup and treatment   Consultants:  GI  Procedures:  Paracentesis 07/27/2014  Antibiotics:  Ciprofloxacin 07/26/2014 for SBP prophylaxis  HPI/Subjective: John [name] is a 59 y.o. male with Past medical history of hypertension, polysubstance abuse, optic neuropathy on the left eye, CHF presenting with complaints of abdominal pain. The patient mentions since last one week he has been having progressively worsening abdominal pain with distention. He also had trouble breathing with that.  Improved abdominal pain after paracentesis.  Objective: Filed  Vitals:   07/27/14 0954  BP: 124/74  Pulse:   Temp:   Resp:     Intake/Output Summary (Last 24 hours) at 07/27/14 1636 Last data filed at 07/27/14 1418  Gross per 24 hour  Intake    920 ml  Output    450 ml  Net    470 ml   Filed Weights   07/26/14 1623 07/27/14 0028  Weight: 122.471 kg (270 lb) 123.832 kg (273 lb)    Exam: General: Alert, Awake and Oriented to Time, Place and Person. Appear in mild distress Eyes: PERRL ENT: Oral Mucosa clear moist. Neck: no JVD Cardiovascular: S1 and S2 Present, no Murmur, Peripheral Pulses Present Respiratory: Bilateral Air entry equal and Decreased, Clear to Auscultation, noCrackles, no wheezes Abdomen: Bowel Sound present, Soft and distended and diffusely tender Skin: no Rash Extremities: Pedal edema 3-4+, no calf tenderness Neurologic: Grossly no focal neuro deficit.   Data Reviewed: Basic Metabolic Panel:  Recent Labs Lab 07/26/14 1633 07/27/14 0443  NA 134* 134*  K 4.0 4.0  CL 102 104  CO2 23 26  GLUCOSE 89 72  BUN 6 5*  CREATININE 1.54* 1.49*  CALCIUM 8.9 8.2*   Liver Function Tests:  Recent Labs Lab 07/26/14 1633 07/27/14 0443  AST 94* 79*  ALT 47 39  ALKPHOS 150* 117  BILITOT 1.8* 1.8*  PROT 7.9 6.9  ALBUMIN 2.7* 2.3*    Recent Labs Lab 07/26/14 1633  LIPASE 49   No results for input(s): AMMONIA in the last 168 hours. CBC:  Recent Labs Lab 07/26/14 1633 07/27/14 0443  WBC 4.9 5.6  NEUTROABS 2.2  --   HGB 12.0* 10.4*  HCT 34.7* 30.1*  MCV 84.2 84.8  PLT 129* 124*   Cardiac Enzymes: No results for input(s): CKTOTAL, CKMB, CKMBINDEX, TROPONINI in the last 168 hours. BNP (last 3 results)  Recent Labs  06/24/14 0802  BNP 1168.5*    ProBNP (last 3 results) No results for input(s): PROBNP in the last 8760 hours.  CBG: No results for input(s): GLUCAP in the last 168 hours.  No results found for this or any previous visit (from the past 240 hour(s)).   Studies: Ct Abdomen Pelvis W  Contrast  07/26/2014   CLINICAL DATA:  Diffuse abdominal pain for 2 days  EXAM: CT ABDOMEN AND PELVIS WITH CONTRAST  TECHNIQUE: Multidetector CT imaging of the abdomen and pelvis was performed using the standard protocol following bolus administration of intravenous contrast.  CONTRAST:  151mL OMNIPAQUE IOHEXOL 300 MG/ML  SOLN  COMPARISON:  None.  FINDINGS: Lung bases are unremarkable.  Small hiatal hernia.  Sagittal images of the spine shows degenerative changes thoracolumbar spine. Significant disc space flattening with vacuum disc phenomenon at L4-L5 and L5-S1 level.  There is perihepatic and perisplenic ascites. Small ascites is noted in left paracolic gutter. Moderate ascites is noted in right paracolic gutter. Markedly nodular contour of the liver and mild hepatic atrophy consistent with cirrhosis. No focal hepatic mass. No calcified gallstones are noted within gallbladder. The spleen measures 12.5 cm in length. There are varices in proximal perigastric region. Portal vein measures 1.4 cm in diameter. Findings are suspicious for portal hypertension. There is small umbilical hernia containing fat and small bowel lobe without evidence of acute complication. Subcutaneous edema is noted in umbilical and periumbilical region. Minimal subcutaneous edema bilateral flank wall  Oral contrast material was given to the patient. No small bowel obstruction. No ascites or free air. No adenopathy. The pancreas and adrenal glands are unremarkable. Kidneys are symmetrical in size and enhancement. No hydronephrosis or hydroureter. Mild atherosclerotic calcifications of abdominal aorta and iliac arteries. No aortic aneurysm.  Moderate pelvic ascites. Urinary bladder is unremarkable. Prostate gland and seminal vesicles are unremarkable. Small nonspecific bilateral inguinal lymph nodes. No destructive bony lesions are noted within pelvis.  Delayed renal images shows mild delay excretion bilaterally.  IMPRESSION: 1. There is  moderate perihepatic and perisplenic ascites. Mild hepatic atrophy with nodular contour of the liver highly suspicious for cirrhosis. Small varices are noted in perigastric and GE junction region. Portal vein measures 1.4 cm in diameter suspicious for portal hypertension. 2. Borderline splenomegaly with spleen measuring 12.5 cm in length. 3. No hydronephrosis or hydroureter. Mild delayed excretion bilateral kidneys. 4. Moderate ascites is noted in right paracolic gutter. Moderate pelvic ascites. 5. Small umbilical hernia containing fat and small bowel loop without evidence of acute complication. No small bowel obstruction. Mild subcutaneous edema umbilical and periumbilical region.   Electronically Signed   By: Lahoma Crocker M.D.   On: 07/26/2014 22:16    Scheduled Meds: . amLODipine  10 mg Oral Daily  . aspirin EC  325 mg Oral Daily  . ciprofloxacin  400 mg Intravenous Q12H  . furosemide  40 mg Intravenous BID  . heparin  5,000 Units Subcutaneous 3 times per day  . hydrALAZINE  50 mg Oral TID  . [START ON 07/28/2014] Influenza vac split quadrivalent PF  0.5 mL Intramuscular Tomorrow-1000  . lidocaine (PF)      . pantoprazole (PROTONIX) IV  40 mg Intravenous Q24H  . [START ON 07/28/2014] pneumococcal 23 valent vaccine  0.5 mL Intramuscular Tomorrow-1000   Continuous Infusions:   Principal Problem:   Abdominal pain, generalized Active Problems:   Essential hypertension, benign   Polysubstance abuse   Ascites   Cirrhosis    Time spent: 50  minutes    [facility] Pager [phone]. If 7PM-7AM, please contact night-coverage at [email or web address], password [facility] 07/27/2014, 4:36 PM

## 2014-07-27 NOTE — Progress Notes (Signed)
New Admission Note:   Arrival: via ED with techs Mental Orientation: A&Ox4 Telemetry: none ordered Assessment:  See doc flowsheet Skin: intact, feet bilaterally and back flaky and dry IV: right AC IV Pain: 6/10 pain in abdomen, given oxycodone 5 mg PO Safety Measures:  Call bell placed within reach; patient instructed on use of call bell and verbalized understanding. Bed in lowest position.   [address] Orientation: Patient oriented to staff, room, and unit. Family: none at bedside  Orders have been reviewed and implemented. Will continue to monitor.  Arlyss Queen, RN, BSN

## 2014-07-28 DIAGNOSIS — I509 Heart failure, unspecified: Secondary | ICD-10-CM

## 2014-07-28 LAB — IRON AND TIBC
IRON: 48 ug/dL (ref 42–165)
SATURATION RATIOS: 23 % (ref 20–55)
TIBC: 212 ug/dL — ABNORMAL LOW (ref 215–435)
UIBC: 164 ug/dL (ref 125–400)

## 2014-07-28 LAB — HEPATITIS PANEL, ACUTE
HCV Ab: REACTIVE — AB
HEP A IGM: NONREACTIVE
Hep B C IgM: NONREACTIVE
Hepatitis B Surface Ag: NEGATIVE

## 2014-07-28 MED ORDER — METOPROLOL TARTRATE 25 MG PO TABS: 50.0000 mg | ORAL_TABLET | Freq: Two times a day (BID) | ORAL | Status: DC

## 2014-07-28 MED ORDER — SPIRONOLACTONE 50 MG PO TABS: 50.0000 mg | ORAL_TABLET | Freq: Every day | ORAL | Status: DC

## 2014-07-28 MED ORDER — FUROSEMIDE 20 MG PO TABS: 20.0000 mg | ORAL_TABLET | Freq: Every day | ORAL | Status: DC

## 2014-07-28 NOTE — Progress Notes (Signed)
  Echocardiogram 2D Echocardiogram has been performed.  John [name] 07/28/2014, 10:24 AM

## 2014-07-28 NOTE — Clinical Documentation Improvement (Signed)
Supporting Information: Patient with a  History of CHF per 3/30 progress notes.     Possible Clinical Condition: . Document acuity --Acute --Chronic --Acute on Chronic . Document type --Diastolic --Systolic --Combined systolic and diastolic . Due to or associated with --Cardiac or other surgery --Hypertension --Valvular disease --Rheumatic heart disease Endocarditis (valvitis) Pericarditis Myocarditis --Other (specify)     Thank Sherian Maroon Documentation Specialist [phone] [email or web address]

## 2014-07-28 NOTE — Progress Notes (Signed)
Pt discharge instructions given, pt verbalized understanding.  VSS. Denies pain.  Pt left floor via wheelchair accompanied by staff and family. 

## 2014-07-28 NOTE — Discharge Summary (Signed)
Physician Discharge Summary  John [name] RCV:[id number] DOB: [birth year] DOA: 07/26/2014  PCP: No PCP Per Patient  Admit date: 07/26/2014 Discharge date: 07/28/2014  Time spent: 35 minutes  Recommendations for Outpatient Follow-up:  1. Follow-up with PCP in one week   Discharge Diagnoses:  Principal Problem:   Abdominal pain, generalized Active Problems:   Essential hypertension, benign   Polysubstance abuse   Ascites   Cirrhosis   Discharge Condition: Stable  Diet recommendation: Low-salt cardiac diet  Filed Weights   07/26/14 1623 07/27/14 0028 07/27/14 2038  Weight: 122.471 kg (270 lb) 123.832 kg (273 lb) 122.426 kg (269 lb 14.4 oz)    History of present illness:  John [name] is a 59 y.o. 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   Hospital Course:  1. New onset ascites -Status post paracentesis and removal of 2 L of fluid with improvement of the symptoms. Patient was also continued with Lasix during the hospital stay with significant improvement with the anasarca..Analysis is negative for spontaneous bacterial peritonitis. protein, LDH, albumin consistent with ascites from portal hypertension. Given prescription for Lasix, Spironolactone and a beta blocker.Educated the patient about fluid restriction, salt restriction. Recommended patient to get BMP in 1-2 weeks to ensure patient is not developing any hyperkalemia.  2. Cirrhosis -Seems to be multifactorial. Alcohol, nonalcoholic steatohepatosis, congestive heart failure and polysubstance abuse and hepatitis C with high viral load.. Normal iron profile, copper, antinuclear antibodies. Echocardiogram showed a preserved left ventricular function.   3.  Alcohol abuse -Patient states last use was one month back. Keep the patient on thiamine. Consulate the patient regarding  cessation of the alcohol use. Patient stated that he decided to quit drinking. No signs of withdrawal.  4. Polysubstance abuse -History of IV drug. Abuse in the past -Counseled  the patient  5. Hypertension -Continue with amlodipine. Discontinued hydralazine to discharge.   6.Obesity -Consult regarding diet and exercise  7.Congestive heart failure -Has preserved left ventricular function with EF of greater than 60%  Procedures:   Paracentesis   CT abdomen and pelvis  Echocardiogram.   Discharge Exam: Filed Vitals:   07/28/14 1630  BP: 108/61  Pulse: 88  Temp: 98.8 F (37.1 C)  Resp: 17       Discharge Medication List as of 07/28/2014  5:10 PM    START taking these medications   Details  furosemide (LASIX) 20 MG tablet Take 1 tablet (20 mg total) by mouth daily., Starting 07/28/2014, Until Discontinued, Print    metoprolol tartrate (LOPRESSOR) 25 MG tablet Take 2 tablets (50 mg total) by mouth 2 (two) times daily., Starting 07/28/2014, Until Discontinued, Print    spironolactone (ALDACTONE) 50 MG tablet Take 1 tablet (50 mg total) by mouth daily., Starting 07/28/2014, Until Discontinued, Print      CONTINUE these medications which have NOT CHANGED   Details  amLODipine (NORVASC) 10 MG tablet Take 1 tablet (10 mg total) by mouth daily., Starting 06/27/2014, Until Discontinued, Print    aspirin EC 325 MG EC tablet Take 1 tablet (325 mg total) by mouth daily., Starting 06/27/2014, Until Discontinued, Print    Naproxen Sodium (ALEVE PO) Take by mouth., Until Discontinued, Historical Med    traMADol (ULTRAM) 50 MG tablet Take 1 tablet (50 mg total) by mouth every  12 (twelve) hours as needed for moderate pain or severe pain., Starting 06/27/2014, Until Discontinued, Print    UNABLE TO FIND Outpatient Physical and occupational therapy   Diagnosis:  left sided vision loss, optic neuritis, Print      STOP taking these medications     hydrALAZINE (APRESOLINE) 50 MG tablet        Allergies  Allergen Reactions  . Penicillins     Convulsions      The results of significant diagnostics from this hospitalization (including imaging, microbiology, ancillary and laboratory) are listed below for reference.    Significant Diagnostic Studies: Dg Eye Foreign Body  07/11/2014   CLINICAL DATA:  Metal working/exposure; clearance prior to MRI  EXAM: ORBITS FOR FOREIGN BODY - 2 VIEW  COMPARISON:  None.  FINDINGS: No radiopaque foreign body.  Ok to proceed to MRI.  IMPRESSION: No radiopaque foreign body.  Ok to proceed to MRI.   Electronically Signed   By: Sandi Mariscal M.D.   On: 07/11/2014 10:47   Ct Abdomen Pelvis W Contrast  07/26/2014   CLINICAL DATA:  Diffuse abdominal pain for 2 days  EXAM: CT ABDOMEN AND PELVIS WITH CONTRAST  TECHNIQUE: Multidetector CT imaging of the abdomen and pelvis was performed using the standard protocol following bolus administration of intravenous contrast.  CONTRAST:  170mL OMNIPAQUE IOHEXOL 300 MG/ML  SOLN  COMPARISON:  None.  FINDINGS: Lung bases are unremarkable.  Small hiatal hernia.  Sagittal images of the spine shows degenerative changes thoracolumbar spine. Significant disc space flattening with vacuum disc phenomenon at L4-L5 and L5-S1 level.  There is perihepatic and perisplenic ascites. Small ascites is noted in left paracolic gutter. Moderate ascites is noted in right paracolic gutter. Markedly nodular contour of the liver and mild hepatic atrophy consistent with cirrhosis. No focal hepatic mass. No calcified gallstones are noted within gallbladder. The spleen measures 12.5 cm in length. There are varices in proximal perigastric region. Portal vein measures 1.4 cm in diameter. Findings are suspicious for portal hypertension. There is small umbilical hernia containing fat and small bowel lobe without evidence of  acute complication. Subcutaneous edema is noted in umbilical and periumbilical region. Minimal subcutaneous edema bilateral flank wall  Oral contrast material was given to the patient. No small bowel obstruction. No ascites or free air. No adenopathy. The pancreas and adrenal glands are unremarkable. Kidneys are symmetrical in size and enhancement. No hydronephrosis or hydroureter. Mild atherosclerotic calcifications of abdominal aorta and iliac arteries. No aortic aneurysm.  Moderate pelvic ascites. Urinary bladder is unremarkable. Prostate gland and seminal vesicles are unremarkable. Small nonspecific bilateral inguinal lymph nodes. No destructive bony lesions are noted within pelvis.  Delayed renal images shows mild delay excretion bilaterally.  IMPRESSION: 1. There is moderate perihepatic and perisplenic ascites. Mild hepatic atrophy with nodular contour of the liver highly suspicious for cirrhosis. Small varices are noted in perigastric and GE junction region. Portal vein measures 1.4 cm in diameter suspicious for portal hypertension. 2. Borderline splenomegaly with spleen measuring 12.5 cm in length. 3. No hydronephrosis or hydroureter. Mild delayed excretion bilateral kidneys. 4. Moderate ascites is noted in right paracolic gutter. Moderate pelvic ascites. 5. Small umbilical hernia containing fat and small bowel loop without evidence of acute complication. No small bowel obstruction. Mild subcutaneous edema umbilical and periumbilical region.   Electronically Signed   By: Lahoma Crocker M.D.   On: 07/26/2014 22:16   US Paracentesis  07/28/2014   INDICATION: Ascites  EXAM: ULTRASOUND-GUIDED PARACENTESIS  COMPARISON:  None.  MEDICATIONS: 10 cc 1% lidocaine  COMPLICATIONS: None immediate  TECHNIQUE: Informed written consent was obtained from the patient after a discussion of the risks, benefits and alternatives to treatment. A timeout was performed prior to the initiation of the procedure.  Initial ultrasound  scanning demonstrates a large amount of ascites within the right upper abdominal quadrant. The right upper abdomen was prepped and draped in the usual sterile fashion. 1% lidocaine with epinephrine was used for local anesthesia. Under direct ultrasound guidance, a 19 gauge, 7-cm, Yueh catheter was introduced. An ultrasound image was saved for documentation purposed. the paracentesis was performed. The catheter was removed and a dressing was applied. The patient tolerated the procedure well without immediate post procedural complication.  FINDINGS: A total of approximately 1.9 liters of cloudy yellow fluid was removed. Samples were sent to the laboratory as requested by the clinical team.  IMPRESSION: Successful ultrasound-guided paracentesis yielding 1.9 liters of peritoneal fluid.  Read by:  Lavonia Drafts [facility]   Electronically Signed   By: Markus Daft M.D.   On: 07/27/2014 12:22    Microbiology: Recent Results (from the past 240 hour(s))  Body fluid culture     Status: None (Preliminary result)   Collection Time: 07/27/14 10:13 AM  Result Value Ref Range Status   Specimen Description FLUID ASCITIC  Final   Special Requests NONE  Final   Gram Stain   Final    RARE WBC PRESENT, PREDOMINANTLY MONONUCLEAR NO ORGANISMS SEEN Performed at [facility]    Culture NO GROWTH Performed at [facility]   Final   Report Status PENDING  Incomplete     Labs: Basic Metabolic Panel:  Recent Labs Lab 07/26/14 1633 07/27/14 0443  NA 134* 134*  K 4.0 4.0  CL 102 104  CO2 23 26  GLUCOSE 89 72  BUN 6 5*  CREATININE 1.54* 1.49*  CALCIUM 8.9 8.2*   Liver Function Tests:  Recent Labs Lab 07/26/14 1633 07/27/14 0443  AST 94* 79*  ALT 47 39  ALKPHOS 150* 117  BILITOT 1.8* 1.8*  PROT 7.9 6.9  ALBUMIN 2.7* 2.3*    Recent Labs Lab 07/26/14 1633  LIPASE 49   No results for input(s): AMMONIA in the last 168 hours. CBC:  Recent Labs Lab 07/26/14 1633 07/27/14 0443  WBC  4.9 5.6  NEUTROABS 2.2  --   HGB 12.0* 10.4*  HCT 34.7* 30.1*  MCV 84.2 84.8  PLT 129* 124*   Cardiac Enzymes: No results for input(s): CKTOTAL, CKMB, CKMBINDEX, TROPONINI in the last 168 hours. BNP: BNP (last 3 results)  Recent Labs  06/24/14 0802  BNP 1168.5*    ProBNP (last 3 results) No results for input(s): PROBNP in the last 8760 hours.  CBG: No results for input(s): GLUCAP in the last 168 hours.     Signed:  [id number]  [facility] 07/28/2014, 7:06 PM

## 2014-07-28 NOTE — Clinical Documentation Improvement (Signed)
Supporting Information: Renal insufficiency noted per 3/29 progress notes. (Renal insufficiency codes to chronic kidney disease)   Labs: Bun/   Creat/   GFR: 3/30:  5/   1.49/   58. 3/29:  6/   1.54/   56.    Possible Clinical Condition: . Document the stage of CKD --Chronic kidney disease, stage 1- GFR > OR = 90 --Chronic kidney disease, stage 2 (mild) - GFR 60-89 --Chronic kidney disease, stage 3 (moderate) - GFR 30-59 --Chronic kidney disease, stage 4 (severe) - GFR 15-29 --Chronic kidney disease, stage 5- GFR < 15 --End-stage renal disease (ESRD) . Document any underlying cause of CKD such as Diabetes or Hypertension . Document if the patient is dependent on Dialysis . Chronic renal failure without a documented stage will be assigned to Chronic kidney disease, unspecified . Document any associated diagnoses/conditions     Thank Sherian Maroon Documentation Specialist [phone] Ovida [email or web address]

## 2014-07-29 ENCOUNTER — Ambulatory Visit
Admission: RE | Admit: 2014-07-29 | Discharge: 2014-07-29 | Disposition: A | Discharge status: Home / Self Care | Payer: Medicaid Other | Source: Ambulatory Visit | Attending: Neurology | Admitting: Neurology

## 2014-07-29 DIAGNOSIS — H469 Unspecified optic neuritis: Secondary | ICD-10-CM

## 2014-07-29 DIAGNOSIS — R292 Abnormal reflex: Secondary | ICD-10-CM

## 2014-07-29 DIAGNOSIS — R29898 Other symptoms and signs involving the musculoskeletal system: Secondary | ICD-10-CM

## 2014-07-30 LAB — BODY FLUID CULTURE: CULTURE: NO GROWTH

## 2014-08-01 ENCOUNTER — Telehealth: Payer: Self-pay | Admitting: Neurology

## 2014-08-01 LAB — HCV RNA QUANT
HCV QUANT LOG: 6.42 {Log} — AB (ref <1.18)
HCV Quantitative: 2620631 IU/mL — ABNORMAL HIGH (ref <15)

## 2014-08-01 NOTE — Telephone Encounter (Signed)
Call patient with the result of his MRI lumbar spine which shows foraminal disease and likely nerve impingement at L4-5. Patient reports to being asymptomatic at this time and is not interested in any therapy and would like to see how he does over time.  Donika K. Posey Pronto, DO

## 2014-08-03 ENCOUNTER — Ambulatory Visit: Payer: Medicaid Other | Admitting: Occupational Therapy

## 2014-08-03 ENCOUNTER — Ambulatory Visit: Payer: Medicaid Other

## 2014-08-14 ENCOUNTER — Encounter (HOSPITAL_COMMUNITY)
Admission: EM | Disposition: A | Discharge status: Skilled Nursing Facility | Payer: Self-pay | Source: Home / Self Care | Attending: Internal Medicine

## 2014-08-14 ENCOUNTER — Encounter (HOSPITAL_COMMUNITY): Payer: Self-pay

## 2014-08-14 ENCOUNTER — Inpatient Hospital Stay (HOSPITAL_COMMUNITY)
Admission: EM | Admit: 2014-08-14 | Discharge: 2014-09-02 | DRG: 326 | Disposition: A | Discharge status: Skilled Nursing Facility | Payer: Medicaid Other | Attending: Internal Medicine | Admitting: Internal Medicine

## 2014-08-14 DIAGNOSIS — Z79899 Other long term (current) drug therapy: Secondary | ICD-10-CM | POA: Diagnosis not present

## 2014-08-14 DIAGNOSIS — K7210 Chronic hepatic failure without coma: Secondary | ICD-10-CM | POA: Diagnosis present

## 2014-08-14 DIAGNOSIS — K7010 Alcoholic hepatitis without ascites: Secondary | ICD-10-CM | POA: Diagnosis present

## 2014-08-14 DIAGNOSIS — K7031 Alcoholic cirrhosis of liver with ascites: Secondary | ICD-10-CM | POA: Diagnosis present

## 2014-08-14 DIAGNOSIS — E669 Obesity, unspecified: Secondary | ICD-10-CM | POA: Diagnosis present

## 2014-08-14 DIAGNOSIS — D696 Thrombocytopenia, unspecified: Secondary | ICD-10-CM | POA: Diagnosis present

## 2014-08-14 DIAGNOSIS — I864 Gastric varices: Secondary | ICD-10-CM | POA: Diagnosis present

## 2014-08-14 DIAGNOSIS — R609 Edema, unspecified: Secondary | ICD-10-CM | POA: Diagnosis present

## 2014-08-14 DIAGNOSIS — Z880 Allergy status to penicillin: Secondary | ICD-10-CM | POA: Diagnosis not present

## 2014-08-14 DIAGNOSIS — G9340 Encephalopathy, unspecified: Secondary | ICD-10-CM | POA: Diagnosis present

## 2014-08-14 DIAGNOSIS — N189 Chronic kidney disease, unspecified: Secondary | ICD-10-CM

## 2014-08-14 DIAGNOSIS — D62 Acute posthemorrhagic anemia: Secondary | ICD-10-CM | POA: Diagnosis present

## 2014-08-14 DIAGNOSIS — R791 Abnormal coagulation profile: Secondary | ICD-10-CM | POA: Diagnosis present

## 2014-08-14 DIAGNOSIS — R4182 Altered mental status, unspecified: Secondary | ICD-10-CM | POA: Diagnosis present

## 2014-08-14 DIAGNOSIS — N179 Acute kidney failure, unspecified: Secondary | ICD-10-CM | POA: Diagnosis present

## 2014-08-14 DIAGNOSIS — M549 Dorsalgia, unspecified: Secondary | ICD-10-CM | POA: Diagnosis present

## 2014-08-14 DIAGNOSIS — G9341 Metabolic encephalopathy: Secondary | ICD-10-CM | POA: Diagnosis not present

## 2014-08-14 DIAGNOSIS — Z8673 Personal history of transient ischemic attack (TIA), and cerebral infarction without residual deficits: Secondary | ICD-10-CM

## 2014-08-14 DIAGNOSIS — K254 Chronic or unspecified gastric ulcer with hemorrhage: Principal | ICD-10-CM | POA: Diagnosis present

## 2014-08-14 DIAGNOSIS — R509 Fever, unspecified: Secondary | ICD-10-CM | POA: Diagnosis present

## 2014-08-14 DIAGNOSIS — Z8249 Family history of ischemic heart disease and other diseases of the circulatory system: Secondary | ICD-10-CM

## 2014-08-14 DIAGNOSIS — F17220 Nicotine dependence, chewing tobacco, uncomplicated: Secondary | ICD-10-CM | POA: Diagnosis present

## 2014-08-14 DIAGNOSIS — F1096 Alcohol use, unspecified with alcohol-induced persisting amnestic disorder: Secondary | ICD-10-CM

## 2014-08-14 DIAGNOSIS — K766 Portal hypertension: Secondary | ICD-10-CM | POA: Diagnosis present

## 2014-08-14 DIAGNOSIS — G8929 Other chronic pain: Secondary | ICD-10-CM | POA: Diagnosis present

## 2014-08-14 DIAGNOSIS — E875 Hyperkalemia: Secondary | ICD-10-CM | POA: Diagnosis present

## 2014-08-14 DIAGNOSIS — K7460 Unspecified cirrhosis of liver: Secondary | ICD-10-CM | POA: Diagnosis present

## 2014-08-14 DIAGNOSIS — B182 Chronic viral hepatitis C: Secondary | ICD-10-CM | POA: Diagnosis present

## 2014-08-14 DIAGNOSIS — Z7982 Long term (current) use of aspirin: Secondary | ICD-10-CM | POA: Diagnosis not present

## 2014-08-14 DIAGNOSIS — R188 Other ascites: Secondary | ICD-10-CM | POA: Diagnosis not present

## 2014-08-14 DIAGNOSIS — I10 Essential (primary) hypertension: Secondary | ICD-10-CM | POA: Diagnosis present

## 2014-08-14 DIAGNOSIS — E870 Hyperosmolality and hypernatremia: Secondary | ICD-10-CM | POA: Diagnosis present

## 2014-08-14 DIAGNOSIS — H469 Unspecified optic neuritis: Secondary | ICD-10-CM

## 2014-08-14 DIAGNOSIS — F1020 Alcohol dependence, uncomplicated: Secondary | ICD-10-CM | POA: Diagnosis present

## 2014-08-14 DIAGNOSIS — K922 Gastrointestinal hemorrhage, unspecified: Secondary | ICD-10-CM | POA: Insufficient documentation

## 2014-08-14 DIAGNOSIS — R600 Localized edema: Secondary | ICD-10-CM | POA: Diagnosis present

## 2014-08-14 DIAGNOSIS — K7581 Nonalcoholic steatohepatitis (NASH): Secondary | ICD-10-CM | POA: Diagnosis present

## 2014-08-14 DIAGNOSIS — R7401 Elevation of levels of liver transaminase levels: Secondary | ICD-10-CM | POA: Diagnosis present

## 2014-08-14 DIAGNOSIS — D689 Coagulation defect, unspecified: Secondary | ICD-10-CM | POA: Diagnosis present

## 2014-08-14 DIAGNOSIS — I5189 Other ill-defined heart diseases: Secondary | ICD-10-CM | POA: Diagnosis present

## 2014-08-14 DIAGNOSIS — F1010 Alcohol abuse, uncomplicated: Secondary | ICD-10-CM | POA: Diagnosis not present

## 2014-08-14 DIAGNOSIS — B199 Unspecified viral hepatitis without hepatic coma: Secondary | ICD-10-CM | POA: Diagnosis present

## 2014-08-14 DIAGNOSIS — R740 Nonspecific elevation of levels of transaminase and lactic acid dehydrogenase [LDH]: Secondary | ICD-10-CM | POA: Diagnosis present

## 2014-08-14 DIAGNOSIS — J811 Chronic pulmonary edema: Secondary | ICD-10-CM | POA: Diagnosis present

## 2014-08-14 DIAGNOSIS — Z6837 Body mass index (BMI) 37.0-37.9, adult: Secondary | ICD-10-CM

## 2014-08-14 DIAGNOSIS — I517 Cardiomegaly: Secondary | ICD-10-CM | POA: Diagnosis present

## 2014-08-14 HISTORY — DX: Alcohol abuse, uncomplicated: F10.10

## 2014-08-14 HISTORY — PX: ESOPHAGOGASTRODUODENOSCOPY: SHX5428

## 2014-08-14 HISTORY — DX: Other ascites: R18.8

## 2014-08-14 LAB — COMPREHENSIVE METABOLIC PANEL
ALT: 45 U/L (ref 0–53)
ANION GAP: 6 (ref 5–15)
AST: 114 U/L — ABNORMAL HIGH (ref 0–37)
Albumin: 1.9 g/dL — ABNORMAL LOW (ref 3.5–5.2)
Alkaline Phosphatase: 51 U/L (ref 39–117)
BILIRUBIN TOTAL: 1.6 mg/dL — AB (ref 0.3–1.2)
BUN: 46 mg/dL — ABNORMAL HIGH (ref 6–23)
CO2: 23 mmol/L (ref 19–32)
CREATININE: 2.01 mg/dL — AB (ref 0.50–1.35)
Calcium: 8 mg/dL — ABNORMAL LOW (ref 8.4–10.5)
Chloride: 116 mmol/L — ABNORMAL HIGH (ref 96–112)
GFR, EST AFRICAN AMERICAN: 41 mL/min — AB (ref >90)
GFR, EST NON AFRICAN AMERICAN: 35 mL/min — AB (ref >90)
Glucose, Bld: 101 mg/dL — ABNORMAL HIGH (ref 70–99)
POTASSIUM: 5.6 mmol/L — AB (ref 3.5–5.1)
Sodium: 145 mmol/L (ref 135–145)
TOTAL PROTEIN: 5.4 g/dL — AB (ref 6.0–8.3)

## 2014-08-14 LAB — CBC WITH DIFFERENTIAL/PLATELET
Basophils Absolute: 0 10*3/uL (ref 0.0–0.1)
Basophils Relative: 0 % (ref 0–1)
EOS ABS: 0 10*3/uL (ref 0.0–0.7)
EOS PCT: 0 % (ref 0–5)
HCT: 16 % — ABNORMAL LOW (ref 39.0–52.0)
HEMOGLOBIN: 5.4 g/dL — AB (ref 13.0–17.0)
Lymphocytes Relative: 28 % (ref 12–46)
Lymphs Abs: 2.8 10*3/uL (ref 0.7–4.0)
MCH: 29.2 pg (ref 26.0–34.0)
MCHC: 33.8 g/dL (ref 30.0–36.0)
MCV: 86.5 fL (ref 78.0–100.0)
Monocytes Absolute: 1.1 10*3/uL — ABNORMAL HIGH (ref 0.1–1.0)
Monocytes Relative: 11 % (ref 3–12)
NEUTROS ABS: 6.2 10*3/uL (ref 1.7–7.7)
NEUTROS PCT: 61 % (ref 43–77)
Platelets: 98 10*3/uL — ABNORMAL LOW (ref 150–400)
RBC: 1.85 MIL/uL — AB (ref 4.22–5.81)
RDW: 15.9 % — ABNORMAL HIGH (ref 11.5–15.5)
WBC: 10.1 10*3/uL (ref 4.0–10.5)

## 2014-08-14 LAB — PROTIME-INR
INR: 1.61 — ABNORMAL HIGH (ref 0.00–1.49)
PROTHROMBIN TIME: 19.3 s — AB (ref 11.6–15.2)
Prothrombin Time: UNDETERMINED seconds (ref 11.6–15.2)

## 2014-08-14 LAB — POC OCCULT BLOOD, ED: FECAL OCCULT BLD: POSITIVE — AB

## 2014-08-14 LAB — ETHANOL: Alcohol, Ethyl (B): <5 mg/dL (ref 0–9)

## 2014-08-14 LAB — PREPARE RBC (CROSSMATCH)

## 2014-08-14 LAB — ABO/RH: ABO/RH(D): O POS

## 2014-08-14 SURGERY — EGD (ESOPHAGOGASTRODUODENOSCOPY)
Anesthesia: Moderate Sedation

## 2014-08-14 MED ORDER — VITAMIN K1 10 MG/ML IJ SOLN
10.0000 mg | Freq: Once | INTRAVENOUS | Status: AC
  Administered 2014-08-14: 10 mg via INTRAVENOUS
  Filled 2014-08-14: qty 1

## 2014-08-14 MED ORDER — CIPROFLOXACIN IN D5W 400 MG/200ML IV SOLN
400.0000 mg | Freq: Two times a day (BID) | INTRAVENOUS | Status: AC
  Administered 2014-08-14 – 2014-08-18 (×8): 400 mg via INTRAVENOUS
  Filled 2014-08-14 (×11): qty 200

## 2014-08-14 MED ORDER — MORPHINE SULFATE 2 MG/ML IJ SOLN
2.0000 mg | INTRAMUSCULAR | Status: DC | PRN
  Administered 2014-08-15 – 2014-08-18 (×3): 2 mg via INTRAVENOUS
  Filled 2014-08-14 (×4): qty 1

## 2014-08-14 MED ORDER — PANTOPRAZOLE SODIUM 40 MG IV SOLR
80.0000 mg | Freq: Once | INTRAVENOUS | Status: AC
  Administered 2014-08-14: 80 mg via INTRAVENOUS
  Filled 2014-08-14: qty 80

## 2014-08-14 MED ORDER — DEXTROSE 5 % IV SOLN: 1.0000 g | INTRAVENOUS | Status: DC

## 2014-08-14 MED ORDER — BUTAMBEN-TETRACAINE-BENZOCAINE 2-2-14 % EX AERO
INHALATION_SPRAY | CUTANEOUS | Status: DC | PRN
  Administered 2014-08-14: 2 via TOPICAL

## 2014-08-14 MED ORDER — PANTOPRAZOLE SODIUM 40 MG IV SOLR
40.0000 mg | Freq: Once | INTRAVENOUS | Status: AC
  Administered 2014-08-14: 40 mg via INTRAVENOUS
  Filled 2014-08-14: qty 40

## 2014-08-14 MED ORDER — PANTOPRAZOLE SODIUM 40 MG IV SOLR
40.0000 mg | Freq: Two times a day (BID) | INTRAVENOUS | Status: DC
  Filled 2014-08-14: qty 40

## 2014-08-14 MED ORDER — ONDANSETRON HCL 4 MG PO TABS: 4.0000 mg | ORAL_TABLET | Freq: Four times a day (QID) | ORAL | Status: DC | PRN

## 2014-08-14 MED ORDER — OCTREOTIDE LOAD VIA INFUSION
50.0000 ug | Freq: Once | INTRAVENOUS | Status: AC
Start: 2014-08-14 — End: 2014-08-14
  Administered 2014-08-14: 50 ug via INTRAVENOUS
  Filled 2014-08-14: qty 25

## 2014-08-14 MED ORDER — DIPHENHYDRAMINE HCL 50 MG/ML IJ SOLN
INTRAMUSCULAR | Status: AC
  Filled 2014-08-14: qty 1

## 2014-08-14 MED ORDER — FENTANYL CITRATE (PF) 100 MCG/2ML IJ SOLN
INTRAMUSCULAR | Status: AC
  Filled 2014-08-14: qty 4

## 2014-08-14 MED ORDER — SODIUM CHLORIDE 0.9 % IV SOLN
INTRAVENOUS | Status: DC
  Administered 2014-08-14: 17:00:00 via INTRAVENOUS
  Administered 2014-08-15: 75 mL/h via INTRAVENOUS
  Administered 2014-08-16 – 2014-08-17 (×2): via INTRAVENOUS

## 2014-08-14 MED ORDER — FENTANYL CITRATE (PF) 100 MCG/2ML IJ SOLN
INTRAMUSCULAR | Status: DC | PRN
  Administered 2014-08-14 (×2): 25 ug via INTRAVENOUS

## 2014-08-14 MED ORDER — ONDANSETRON HCL 4 MG/2ML IJ SOLN
4.0000 mg | Freq: Four times a day (QID) | INTRAMUSCULAR | Status: DC | PRN
  Administered 2014-08-14 – 2014-08-18 (×2): 4 mg via INTRAVENOUS
  Filled 2014-08-14 (×2): qty 2

## 2014-08-14 MED ORDER — MIDAZOLAM HCL 10 MG/2ML IJ SOLN
INTRAMUSCULAR | Status: DC | PRN
  Administered 2014-08-14 (×2): 2 mg via INTRAVENOUS

## 2014-08-14 MED ORDER — SODIUM CHLORIDE 0.9 % IV SOLN
50.0000 ug/h | INTRAVENOUS | Status: DC
  Administered 2014-08-14 – 2014-08-16 (×4): 50 ug/h via INTRAVENOUS
  Filled 2014-08-14 (×13): qty 1

## 2014-08-14 MED ORDER — MIDAZOLAM HCL 10 MG/2ML IJ SOLN
INTRAMUSCULAR | Status: AC
  Filled 2014-08-14: qty 4

## 2014-08-14 MED ORDER — SODIUM CHLORIDE 0.9 % IV SOLN
8.0000 mg/h | INTRAVENOUS | Status: DC
  Administered 2014-08-14 – 2014-08-16 (×5): 8 mg/h via INTRAVENOUS
  Filled 2014-08-14 (×13): qty 80

## 2014-08-14 MED ORDER — SODIUM CHLORIDE 0.9 % IJ SOLN
3.0000 mL | Freq: Two times a day (BID) | INTRAMUSCULAR | Status: DC
  Administered 2014-08-15 – 2014-08-22 (×9): 3 mL via INTRAVENOUS

## 2014-08-14 MED ORDER — SODIUM CHLORIDE 0.9 % IV SOLN: Freq: Once | INTRAVENOUS | Status: DC

## 2014-08-14 MED ORDER — SODIUM CHLORIDE 0.9 % IV SOLN
INTRAVENOUS | Status: DC
  Administered 2014-08-14: 12:00:00 via INTRAVENOUS

## 2014-08-14 MED ORDER — OXYCODONE HCL 5 MG PO TABS
5.0000 mg | ORAL_TABLET | ORAL | Status: DC | PRN
  Administered 2014-08-16 – 2014-08-18 (×2): 5 mg via ORAL
  Filled 2014-08-14 (×3): qty 1

## 2014-08-14 MED ORDER — DIPHENHYDRAMINE HCL 50 MG/ML IJ SOLN
INTRAMUSCULAR | Status: DC | PRN
  Administered 2014-08-14: 25 mg via INTRAVENOUS

## 2014-08-14 NOTE — Interval H&P Note (Signed)
History and Physical Interval Note:  08/14/2014 3:47 PM  John [name]  has presented today for surgery, with the diagnosis of GI bleed  The various methods of treatment have been discussed with the patient and family. After consideration of risks, benefits and other options for treatment, the patient has consented to  Procedure(s): ESOPHAGOGASTRODUODENOSCOPY (EGD) (N/A) as a surgical intervention .  The patient's history has been reviewed, patient examined, no change in status, stable for surgery.  I have reviewed the patient's chart and labs.  Questions were answered to the patient's satisfaction.     Pricilla Riffle. Fuller Plan MD

## 2014-08-14 NOTE — Progress Notes (Signed)
Pt update:Per MD order patient transferred to [place] at [facility] via [place].  Report called to receiving  RN Naida Sleight.  Pt has completed almost 3 units PRBCs and remains on Protonix and Octreotide.  Pt lethargic but alert and oriented.  VSS.  Family has been called and RN number was left with niece Sherian Rein, who were already aware of patient transfer.

## 2014-08-14 NOTE — ED Notes (Signed)
Bed: WA07 Expected date:  Expected time:  Means of arrival:  Comments: EMS-GI bleed

## 2014-08-14 NOTE — Consult Note (Signed)
Chief Complaint: Chief Complaint  Patient presents with  . Rectal Bleeding  . Hematemesis    Referring Physician(s): Dr. Lucio Edward  History of Present Illness: John [name] is a 59 y.o. male presenting to the [facility] ED via EMS for hematochezia and hematemesis for 2 or 3 days preceding his arrival.  He apparently called the EMS himself with these complaints.    History is gained from the medical record and from speaking to the medical team, as the patient is status-post upper endoscopy and also has some encephalopathy at baseline.     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    Upper endoscopy performed today by Dr. 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  Ulceration was viewed, as well as blood products within the lumen.   A prior CT performed during a recent admission (07/26/2014) shows evidence of portal hypertension, with cirrhosis, ascites, gastric varices, and a gastro-renal / porto-systemic shunt.  There do appear to be developing esophageal varices on the CT, which appear continuous with the left gastric vein.  No portal vein thrombus, with small splenic vein.  Enlarged short gastric veins and posterior gastric vein.    John [name] has a history of CHF, though there are no results of Echo in the current medical record.  He has a history of baseline encephalopathy.    MELD score currently 20.  CP score is C, value of 11.  APACHE ii is 21, with concurrent prognosis of 38.9% in-hospital mortality.    Past Medical History  Diagnosis Date  . Hypertension   . Chronic back pain   . Stroke 1998  . Polysubstance abuse   . Optic neuropathy, left   . CHF (congestive heart failure)   . Ascites   . ETOH abuse     Past Surgical History  Procedure Laterality Date  . None      Allergies: Penicillins  Medications: Prior to Admission medications     Medication Sig Start Date End Date Taking? Authorizing Provider  amLODipine (NORVASC) 10 MG tablet Take 1 tablet (10 mg total) by mouth daily. 06/27/14   Ripudeep Krystal Eaton, MD  aspirin EC 325 MG EC tablet Take 1 tablet (325 mg total) by mouth daily. 06/27/14   Ripudeep Krystal Eaton, MD  furosemide (LASIX) 20 MG tablet Take 1 tablet (20 mg total) by mouth daily. 07/28/14   Monica Becton, MD  metoprolol tartrate (LOPRESSOR) 25 MG tablet Take 2 tablets (50 mg total) by mouth 2 (two) times daily. 07/28/14   Monica Becton, MD  Naproxen Sodium (ALEVE PO) Take by mouth.    Historical Provider, MD  spironolactone (ALDACTONE) 50 MG tablet Take 1 tablet (50 mg total) by mouth daily. 07/28/14   Monica Becton, MD  traMADol (ULTRAM) 50 MG tablet Take 1 tablet (50 mg total) by mouth every 12 (twelve) hours as needed for moderate pain or severe pain. 06/27/14   Ripudeep Krystal Eaton, MD  UNABLE TO FIND Outpatient Physical and occupational therapy   Diagnosis: left sided vision loss, optic neuritis 06/29/14   Ripudeep Krystal Eaton, MD     Family History  Problem Relation Age of Onset  . Hypertension Mother     Living  . Hypertension Brother   . Kidney disease Mother   . Kidney disease Brother   . Hypertension Father   . Hypertension Father     Deceased,  48  . Hypertension Sister   . Ulcers Father     History   Social History  . Marital Status: Single    Spouse Name: N/A  . Number of Children: N/A  . Years of Education: N/A   Social History Main Topics  . Smoking status: Former Smoker -- 0.30 packs/day for 20 years    Types: Cigarettes  . Smokeless tobacco: Current User    Types: Snuff     Comment: 2015  . Alcohol Use: 0.0 oz/week    0 Standard drinks or equivalent per week     Comment: occ  . Drug Use: Yes    Special: Cocaine, Marijuana     Comment: since fall 2015  . Sexual Activity: Not on file   Other Topics Concern  . None   Social History Narrative   Lives with niece in a 2 story home.      On disability since 2015 for low back pain.  Used to work Statistician.    Highest level of education: 11th grade     Review of Systems: A 12 point ROS discussed and pertinent positives are indicated in the HPI above.  All other systems are negative.  Review of Systems  Vital Signs: BP 113/66 mmHg  Pulse 78  Temp(Src) 98.3 F (36.8 C) (Oral)  Resp 20  SpO2 98%  Physical Exam  Atraumatic, Normocephalic.  No icterus or scleral injection. Gaze is dysconjugate.   Glascow Coma Scale: Eye: 3/4 Verbal: 4/5 Motor: 5/6 Oriented to person, place, month, year.   Gross motor intact. Chest: mild wheezes.  No rhonchi or rales RRR.  No third heart sounds. Abdomen tender to palpation.  No peritoneal signs/rigidity. Genitourinary deferred   Mallampati Score:  MD Evaluation Airway: WNL Heart: WNL Abdomen: WNL Chest/ Lungs: WNL ASA  Classification: 3 Mallampati/Airway Score: Three  Imaging: John Lumbar Spine Wo Contrast  07/29/2014   CLINICAL DATA:  Low back pain, worse on the left than the right. Weakness and numbness in the left leg, approximately 1 year duration. Began after lifting heavy object.  EXAM: MRI LUMBAR SPINE WITHOUT CONTRAST  TECHNIQUE: Multiplanar, multisequence John imaging of the lumbar spine was performed. No intravenous contrast was administered.  COMPARISON:  CT abdomen 07/26/2014  FINDINGS: T12-L1:  Normal.  Conus tip at this level.  L1-2: Retrolisthesis of 2 mm. Mild bulging of the disc. No compressive stenosis.  L2-3: Retrolisthesis of 2 mm. Mild bulging of the disc. No stenosis.  L3-4:  Mild bulging of the disc.  No compressive stenosis.  L4-5: Endplate osteophytes and shallow protrusion of disc material. Mild facet and ligamentous hypertrophy. Mild stenosis of both lateral recesses without definite neural compression.  L5-S1: Bilateral facet arthropathy with 5 mm of anterolisthesis. Disc degeneration with circumferential bulging of the disc. The central canal  sufficiently patent. Foraminal narrowing bilaterally that could compress either or both L5 nerve roots, more likely the left.  IMPRESSION: Non-compressive degenerative changes at L1-2, L2-3 and L3-4.  L4-5: Endplate osteophytes and shallow protrusion of disc material. Mild facet hypertrophy. Mild stenosis of both lateral recesses without definite neural compression.  L5-S1: Bilateral facet arthropathy with 5 mm anterolisthesis. Disc degeneration and bulging of the disc. Foraminal stenosis bilaterally, left more than right. Either L5 nerve root could be compressed, more likely the left.   Electronically Signed   By: Nelson Chimes M.D.   On: 07/29/2014 20:18   Ct Abdomen Pelvis W Contrast  07/26/2014   CLINICAL DATA:  Diffuse abdominal pain for 2 days  EXAM: CT ABDOMEN AND PELVIS WITH CONTRAST  TECHNIQUE: Multidetector CT imaging of the abdomen and pelvis was performed using the standard protocol following bolus administration of intravenous contrast.  CONTRAST:  141mL OMNIPAQUE IOHEXOL 300 MG/ML  SOLN  COMPARISON:  None.  FINDINGS: Lung bases are unremarkable.  Small hiatal hernia.  Sagittal images of the spine shows degenerative changes thoracolumbar spine. Significant disc space flattening with vacuum disc phenomenon at L4-L5 and L5-S1 level.  There is perihepatic and perisplenic ascites. Small ascites is noted in left paracolic gutter. Moderate ascites is noted in right paracolic gutter. Markedly nodular contour of the liver and mild hepatic atrophy consistent with cirrhosis. No focal hepatic mass. No calcified gallstones are noted within gallbladder. The spleen measures 12.5 cm in length. There are varices in proximal perigastric region. Portal vein measures 1.4 cm in diameter. Findings are suspicious for portal hypertension. There is small umbilical hernia containing fat and small bowel lobe without evidence of acute complication. Subcutaneous edema is noted in umbilical and periumbilical region. Minimal  subcutaneous edema bilateral flank wall  Oral contrast material was given to the patient. No small bowel obstruction. No ascites or free air. No adenopathy. The pancreas and adrenal glands are unremarkable. Kidneys are symmetrical in size and enhancement. No hydronephrosis or hydroureter. Mild atherosclerotic calcifications of abdominal aorta and iliac arteries. No aortic aneurysm.  Moderate pelvic ascites. Urinary bladder is unremarkable. Prostate gland and seminal vesicles are unremarkable. Small nonspecific bilateral inguinal lymph nodes. No destructive bony lesions are noted within pelvis.  Delayed renal images shows mild delay excretion bilaterally.  IMPRESSION: 1. There is moderate perihepatic and perisplenic ascites. Mild hepatic atrophy with nodular contour of the liver highly suspicious for cirrhosis. Small varices are noted in perigastric and GE junction region. Portal vein measures 1.4 cm in diameter suspicious for portal hypertension. 2. Borderline splenomegaly with spleen measuring 12.5 cm in length. 3. No hydronephrosis or hydroureter. Mild delayed excretion bilateral kidneys. 4. Moderate ascites is noted in right paracolic gutter. Moderate pelvic ascites. 5. Small umbilical hernia containing fat and small bowel loop without evidence of acute complication. No small bowel obstruction. Mild subcutaneous edema umbilical and periumbilical region.   Electronically Signed   By: Lahoma Crocker M.D.   On: 07/26/2014 22:16   US Paracentesis  07/28/2014   INDICATION: Ascites  EXAM: ULTRASOUND-GUIDED PARACENTESIS  COMPARISON:  None.  MEDICATIONS: 10 cc 1% lidocaine  COMPLICATIONS: None immediate  TECHNIQUE: Informed written consent was obtained from the patient after a discussion of the risks, benefits and alternatives to treatment. A timeout was performed prior to the initiation of the procedure.  Initial ultrasound scanning demonstrates a large amount of ascites within the right upper abdominal quadrant. The  right upper abdomen was prepped and draped in the usual sterile fashion. 1% lidocaine with epinephrine was used for local anesthesia. Under direct ultrasound guidance, a 19 gauge, 7-cm, Yueh catheter was introduced. An ultrasound image was saved for documentation purposed. the paracentesis was performed. The catheter was removed and a dressing was applied. The patient tolerated the procedure well without immediate post procedural complication.  FINDINGS: A total of approximately 1.9 liters of cloudy yellow fluid was removed. Samples were sent to the laboratory as requested by the clinical team.  IMPRESSION: Successful ultrasound-guided paracentesis yielding 1.9 liters of peritoneal fluid.  Read by:  Lavonia Drafts [facility]   Electronically Signed   By: Markus Daft M.D.   On: 07/27/2014 12:22  Labs:  CBC:  Recent Labs  06/27/14 0622 07/26/14 1633 07/27/14 0443 08/14/14 1233  WBC 15.9* 4.9 5.6 10.1  HGB 11.9* 12.0* 10.4* 5.4*  HCT 34.8* 34.7* 30.1* 16.0*  PLT 93* 129* 124* 98*    COAGS:  Recent Labs  06/23/14 2214 07/26/14 1948 07/27/14 0443 08/14/14 1150 08/14/14 1233  INR 1.13 1.32 1.42 NOT CALCULATED 1.61*  APTT 33  --   --   --   --     BMP:  Recent Labs  06/29/14 0512 07/26/14 1633 07/27/14 0443 08/14/14 1233  NA 135 134* 134* 145  K 4.0 4.0 4.0 5.6*  CL 102 102 104 116*  CO2 26 23 26 23   GLUCOSE 132* 89 72 101*  BUN 34* 6 5* 46*  CALCIUM 8.3* 8.9 8.2* 8.0*  CREATININE 1.71* 1.54* 1.49* 2.01*  GFRNONAA 43* 48* 50* 35*  GFRAA 49* 56* 58* 41*    LIVER FUNCTION TESTS:  Recent Labs  06/26/14 0054 07/26/14 1633 07/27/14 0443 08/14/14 1233  BILITOT 1.5* 1.8* 1.8* 1.6*  AST 108* 94* 79* 114*  ALT 68* 47 39 45  ALKPHOS 106 150* 117 51  PROT 7.4 7.9 6.9 5.4*  ALBUMIN 2.5* 2.7* 2.3* 1.9*    TUMOR MARKERS: No results for input(s): AFPTM, CEA, CA199, CHROMGRNA in the last 8760 hours.  Assessment and Plan:  59 year old male presenting with acute blood loss  anemia secondary to 2-3 days of multiple episodes of hematemesis/hematochezia related to cirrhosis/portal hypertension.  Gastric varices confirmed on Upper endo today, confirming findings on a prior CT performed 07/26/14.    Agree with primary medical treatment with ICU support, sandostatin/octreotide, blood transfusion, antibiotics.     Patient is a candidate for BRTO to address the bleeding gastric varices, with consideration for additional tips.  Factors which complicate a TIPS placement would be MELD > 20 (poor post-tips prognosis over MELD = 18), history of CHF, and record of baseline encephalopathy.    If possible, transfer to [facility] may be best to have VIR support for any possible BRTO and/or TIPS procedure.  This detail was discussed with Dr. Coralyn Pear, and the Gastroenterology team.   I spoke at length with his mother, Hrishikesh Hoeg [phone] - home), and also received his niece's contact info as well.  Laurence Crofford [phone] - cell, [phone] - home)   Thank you for this interesting consult.  I greatly enjoyed meeting John [name] and look forward to participating in his care.  SignedCorrie Mckusick 08/14/2014, 5:41 PM   I spent a total of 80 Minutes    in face to face in clinical consultation, greater than 50% of which was counseling/coordinating care for bleeding gastric varices and portal hypertension, for potential BRTO and/or TIPS.  Signed,  Dulcy Fanny. Earleen Newport, DO

## 2014-08-14 NOTE — Progress Notes (Signed)
Follow up note  Patient undergoing EGD, procedure performed by Dr Fuller Plan, found to have lave gastric cardia varices with superficial ulcer. Dr Earleen Newport of interventional radiology was consulted who recommended balloon-occluded transvenous obliteration for management of gastric varices. He recommended patient be transferred to [facility] to have this procedure done. I have written transfer orders to the SDU at [facility]. Patient is currently hemodynamically stable, last BP of 134/93 with pulse of 80, RR of 22.

## 2014-08-14 NOTE — H&P (View-Only) (Signed)
Consult Note   Referring Provider:  [facility] Primary Care Physician: Angelica Chessman, MD Primary Gastroenterologist:  Mayo Ao  Reason for Consultation:    HPI: John [name] is a 59 y.o. male presented to ED with hematemesis and maroon blood per rectum for 2-3 days. He has a history of polysubstance abuse including etoh. He was recently discharged from [facility] on 3/31 for new onset ascites. Cirrhosis likely secondary to Hep C and alcohol. Says only etoh has been 1 beer since discharge on 3/31. Paracentesis last admission showed low WBC and SAAG c/w portal hypertension. He takes Aleve and ASA on a daily basis.    Past Medical History  Diagnosis Date  . Hypertension   . Chronic back pain   . Stroke 1998  . Polysubstance abuse   . Optic neuropathy, left   . CHF (congestive heart failure)   . Ascites   . ETOH abuse     Past Surgical History  Procedure Laterality Date  . None      Prior to Admission medications   Medication Sig Start Date End Date Taking? Authorizing Provider  amLODipine (NORVASC) 10 MG tablet Take 1 tablet (10 mg total) by mouth daily. 06/27/14   Ripudeep Krystal Eaton, MD  aspirin EC 325 MG EC tablet Take 1 tablet (325 mg total) by mouth daily. 06/27/14   Ripudeep Krystal Eaton, MD  furosemide (LASIX) 20 MG tablet Take 1 tablet (20 mg total) by mouth daily. 07/28/14   Monica Becton, MD  metoprolol tartrate (LOPRESSOR) 25 MG tablet Take 2 tablets (50 mg total) by mouth 2 (two) times daily. 07/28/14   Monica Becton, MD  Naproxen Sodium (ALEVE PO) Take by mouth.    Historical Provider, MD  spironolactone (ALDACTONE) 50 MG tablet Take 1 tablet (50 mg total) by mouth daily. 07/28/14   Monica Becton, MD  traMADol (ULTRAM) 50 MG tablet Take 1 tablet (50 mg total) by mouth every 12 (twelve) hours as needed for moderate pain or severe pain. 06/27/14   Ripudeep Krystal Eaton, MD  UNABLE TO FIND Outpatient Physical and occupational therapy   Diagnosis: left sided vision  loss, optic neuritis 06/29/14   Ripudeep Krystal Eaton, MD    Current Facility-Administered Medications  Medication Dose Route Frequency Provider Last Rate Last Dose  . 0.9 %  sodium chloride infusion   Intravenous Continuous Orlie Dakin, MD 150 mL/hr at 08/14/14 1152    . 0.9 %  sodium chloride infusion   Intravenous Once Kelvin Cellar, MD       Current Outpatient Prescriptions  Medication Sig Dispense Refill  . amLODipine (NORVASC) 10 MG tablet Take 1 tablet (10 mg total) by mouth daily. 30 tablet 4  . aspirin EC 325 MG EC tablet Take 1 tablet (325 mg total) by mouth daily. 30 tablet 4  . furosemide (LASIX) 20 MG tablet Take 1 tablet (20 mg total) by mouth daily. 30 tablet 0  . metoprolol tartrate (LOPRESSOR) 25 MG tablet Take 2 tablets (50 mg total) by mouth 2 (two) times daily. 60 tablet 0  . Naproxen Sodium (ALEVE PO) Take by mouth.    . spironolactone (ALDACTONE) 50 MG tablet Take 1 tablet (50 mg total) by mouth daily. 30 tablet 0  . traMADol (ULTRAM) 50 MG tablet Take 1 tablet (50 mg total) by mouth every 12 (twelve) hours as needed for moderate pain or severe pain. 30 tablet 0  . UNABLE TO FIND Outpatient Physical and occupational therapy   Diagnosis: left sided vision  loss, optic neuritis 1 Mutually Defined 0    Allergies as of 08/14/2014 - Review Complete 08/14/2014  Allergen Reaction Noted  . Penicillins  12/16/2012    Family History  Problem Relation Age of Onset  . Hypertension Mother     Living  . Hypertension Brother   . Kidney disease Mother   . Kidney disease Brother   . Hypertension Father   . Hypertension Father     Deceased, 2  . Hypertension Sister   . Ulcers Father     History   Social History  . Marital Status: Single    Spouse Name: N/A  . Number of Children: N/A  . Years of Education: N/A   Occupational History  . Not on file.   Social History Main Topics  . Smoking status: Former Smoker -- 0.30 packs/day for 20 years    Types: Cigarettes    . Smokeless tobacco: Current User    Types: Snuff     Comment: 2015  . Alcohol Use: 0.0 oz/week    0 Standard drinks or equivalent per week     Comment: occ  . Drug Use: Yes    Special: Cocaine, Marijuana     Comment: since fall 2015  . Sexual Activity: Not on file   Other Topics Concern  . Not on file   Social History Narrative   Lives with niece in a 2 story home.     On disability since 2015 for low back pain.  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 Resp: Denies dyspnea at rest, dyspnea with exercise, cough, sputum, wheezing, coughing up blood, and pleurisy. GI: Denies jaundice, and fecal incontinence.  Denies dysphagia or odynophagia. GU : Denies urinary burning, blood in urine, urinary frequency, urinary hesitancy, nocturnal urination, and urinary incontinence. MS: Denies joint pain, limitation of movement, and swelling, stiffness, low back pain, extremity pain. Denies muscle weakness, cramps, atrophy.  Derm: Denies rash, itching, dry skin, hives, moles, warts, or unhealing ulcers.  Psych: Denies depression, anxiety, memory loss, suicidal ideation, hallucinations, paranoia, and confusion. Heme: Denies bruising, bleeding, and enlarged lymph nodes. Neuro:  Denies any headaches, dizziness, paresthesias. Endo:  Denies any problems with DM, thyroid, adrenal function.  Physical Exam: Vital signs in last 24 hours: Temp:  [98.3 F (36.8 C)-100.1 F (37.8 C)] 98.6 F (37 C) (04/17 1448) Pulse Rate:  [74-80] 75 (04/17 1515) Resp:  [16-20] 19 (04/17 1515) BP: (106-151)/(60-79) 151/76 mmHg (04/17 1515) SpO2:  [100 %] 100 % (04/17 1515)   General:   Alert, well-developed, acutely ill appearing Head:   Normocephalic and atraumatic. Eyes:  Sclera clear, no icterus.   Conjunctiva pink. Ears:  Normal auditory acuity. Nose:  No deformity, discharge, or lesions. Mouth:  No deformity or lesions.  Oropharynx pink & moist. Neck:  Supple; no masses or thyromegaly. Lungs:  Clear throughout to auscultation.  No wheezes, crackles, or rhonchi. No acute distress. Heart:  Regular rate and rhythm; no murmurs, clicks, rubs,  or gallops. Abdomen:  Soft, mild epigastric tenderness and nondistended. No masses, hepatosplenomegaly or hernias noted. Normal bowel sounds, without guarding, and without rebound.   Rectal:  Maroon blood, no lesions   Msk:  Symmetrical without gross deformities. Normal posture. Pulses:  Normal pulses noted.  Extremities:  Without clubbing or edema. Neurologic:  Alert and  oriented x4;  grossly normal neurologically. Skin:  Intact without significant lesions or rashes. Cervical Nodes:  No significant cervical adenopathy. Psych:  Alert and cooperative. Normal mood and affect.  Intake/Output from previous day:   Intake/Output this shift: Total I/O In: 835 [I.V.:500; Blood:335] Out: -   Lab Results:  Recent Labs  08/14/14 1233  WBC 10.1  HGB 5.4*  HCT 16.0*  PLT 98*   BMET  Recent Labs  08/14/14 1233  NA 145  K 5.6*  CL 116*  CO2 23  GLUCOSE 101*  BUN 46*  CREATININE 2.01*  CALCIUM 8.0*   LFT  Recent Labs  08/14/14 1233  PROT 5.4*  ALBUMIN 1.9*  AST 114*  ALT 45  ALKPHOS 51  BILITOT 1.6*   PT/INR  Recent Labs  08/14/14 1150 08/14/14 1233  LABPROT SPECIMEN CONTAMINATED, UNABLE TO PERFORM TEST(S). 19.3*  INR NOT CALCULATED 1.61*    Previous Endoscopies: none  Impression/ Recommendations:  1. Acute UGI bleed with presumed cirrhosis and ABL anemia. R/O ulcer, varices, MW tear, etc. IV octreotide bolus/infusion, IV PPI bolus/infusion, IV antibiotics. EGD urgently this afternoon. ICU admission.   2. Cirrhosis with ascites and coagulopathy,  thrombocytopenia. Possible etoh hepatitis. Hep C and etoh abuse are likely cause of cirrhosis. Vit K. Monitor for etoh withdrawal.   3. ABL anemia. Transfusions to keep Hb > 8.   4. AKI. Likely volume related.     LOS: 0 days   Ceciley Buist T. Fuller Plan MD 08/14/2014, 3:25 PM

## 2014-08-14 NOTE — Progress Notes (Addendum)
Dr. Coralyn Pear informed of patient limited IV access, multiple unsuccessful attempts made for peripheral IV insertion. Patient currently has 2 PIV: PIV#1 blood transfusion and PIV #2: Octreotide gtt. MD informed that Octreotide not compatible with Protonix gtt and Cipro per Pharmacist. Bolus order of Protonix given and MD aware that Protonix gtt will be on hold until additional IV access obtained due to compatibility issues. Order placed for PICC insertion.

## 2014-08-14 NOTE — ED Notes (Addendum)
Per MD, Pt provided a small amount of ice chips.

## 2014-08-14 NOTE — ED Provider Notes (Signed)
CSN: [id number]     Arrival date & time 08/14/14  1106 History   First MD Initiated Contact with Patient 08/14/14 1108     Chief Complaint  Patient presents with  . Rectal Bleeding  . Hematemesis     (Consider location/radiation/quality/duration/timing/severity/associated sxs/prior Treatment) HPI Complains of vomiting blood with clots onset 3 days ago accompanied by loose bowel movements, with grossly bloody stools onset 3 days ago. He last vomited yesterday. Last bowel movement containing blood this morning. Other associated symptoms include generalized weakness and lightheadedness. Denies nausea at present. Denies abdominal pain denies chest pain. No treatment prior to coming here. Last alcohol use 2 days ago. No other associated symptoms nothing makes symptoms better or worse Past Medical History  Diagnosis Date  . Hypertension   . Chronic back pain   . Stroke 1998  . Polysubstance abuse   . Optic neuropathy, left   . CHF (congestive heart failure)   . Ascites   . ETOH abuse    Past Surgical History  Procedure Laterality Date  . None     Family History  Problem Relation Age of Onset  . Hypertension Mother     Living  . Hypertension Brother   . Kidney disease Mother   . Kidney disease Brother   . Hypertension Father   . Hypertension Father     Deceased, 3  . Hypertension Sister   . Ulcers Father    History  Substance Use Topics  . Smoking status: Former Smoker -- 0.30 packs/day for 20 years    Types: Cigarettes  . Smokeless tobacco: Current User    Types: Snuff     Comment: 2015  . Alcohol Use: 0.0 oz/week    0 Standard drinks or equivalent per week     Comment: occ    Review of Systems  Gastrointestinal: Positive for vomiting and blood in stool.       Hematemesis  All other systems reviewed and are negative.     Allergies  Penicillins  Home Medications   Prior to Admission medications   Medication Sig Start Date End Date Taking? Authorizing  Provider  amLODipine (NORVASC) 10 MG tablet Take 1 tablet (10 mg total) by mouth daily. 06/27/14   Ripudeep Krystal Eaton, MD  aspirin EC 325 MG EC tablet Take 1 tablet (325 mg total) by mouth daily. 06/27/14   Ripudeep Krystal Eaton, MD  furosemide (LASIX) 20 MG tablet Take 1 tablet (20 mg total) by mouth daily. 07/28/14   Monica Becton, MD  metoprolol tartrate (LOPRESSOR) 25 MG tablet Take 2 tablets (50 mg total) by mouth 2 (two) times daily. 07/28/14   Monica Becton, MD  Naproxen Sodium (ALEVE PO) Take by mouth.    Historical Provider, MD  spironolactone (ALDACTONE) 50 MG tablet Take 1 tablet (50 mg total) by mouth daily. 07/28/14   Monica Becton, MD  traMADol (ULTRAM) 50 MG tablet Take 1 tablet (50 mg total) by mouth every 12 (twelve) hours as needed for moderate pain or severe pain. 06/27/14   Ripudeep Krystal Eaton, MD  UNABLE TO FIND Outpatient Physical and occupational therapy   Diagnosis: left sided vision loss, optic neuritis 06/29/14   Ripudeep K Rai, MD   BP 106/60 mmHg  Pulse 80  Temp(Src) 98.6 F (37 C) (Oral)  Resp 18  SpO2 100% Physical Exam  Constitutional:  Chronically ill-appearing  HENT:  Head: Normocephalic and atraumatic.  Mucous membranes dry. Poor dentition  Eyes: Pupils are equal, round, and reactive  to light.  Conjunctiva pale  Neck: Neck supple. No tracheal deviation present. No thyromegaly present.  Cardiovascular: Normal rate and regular rhythm.   No murmur heard. Pulmonary/Chest: Effort normal and breath sounds normal.  Abdominal: Soft. Bowel sounds are normal. He exhibits no distension. There is no tenderness.  Obese  Genitourinary: Guaiac positive stool.  Maroon melanotic stool  Musculoskeletal: Normal range of motion. He exhibits no edema or tenderness.  Neurological: He is alert. Coordination normal.  Skin: Skin is warm and dry. No rash noted.  Psychiatric: He has a normal mood and affect.  Nursing note and vitals reviewed.   ED Course  Procedures (including  critical care time) Labs Review Labs Reviewed  POC OCCULT BLOOD, ED - Abnormal; Notable for the following:    Fecal Occult Bld POSITIVE (*)    All other components within normal limits  COMPREHENSIVE METABOLIC PANEL  CBC WITH DIFFERENTIAL/PLATELET  PROTIME-INR  TYPE AND SCREEN    Imaging Review No results found.   EKG Interpretation   Date/Time:  Sunday August 14 2014 13:35:06 EDT Ventricular Rate:  74 PR Interval:  149 QRS Duration: 90 QT Interval:  428 QTC Calculation: 475 R Axis:   54 Text Interpretation:  Sinus rhythm Minimal ST depression, diffuse leads  Baseline wander in lead(s) V2 No significant change since last tracing  Confirmed by Winfred Leeds  MD, Wanza Szumski [phone]) on 08/14/2014 1:40:03 PM        Results for orders placed or performed during the hospital encounter of 08/14/14  Protime-INR  Result Value Ref Range   Prothrombin Time 26.3 (H) 11.6 - 15.2 seconds   INR 2.40 (H) 0.00 - 1.49  Comprehensive metabolic panel  Result Value Ref Range   Sodium 145 135 - 145 mmol/L   Potassium 5.6 (H) 3.5 - 5.1 mmol/L   Chloride 116 (H) 96 - 112 mmol/L   CO2 23 19 - 32 mmol/L   Glucose, Bld 101 (H) 70 - 99 mg/dL   BUN 46 (H) 6 - 23 mg/dL   Creatinine, Ser 2.01 (H) 0.50 - 1.35 mg/dL   Calcium 8.0 (L) 8.4 - 10.5 mg/dL   Total Protein 5.4 (L) 6.0 - 8.3 g/dL   Albumin 1.9 (L) 3.5 - 5.2 g/dL   AST 114 (H) 0 - 37 U/L   ALT 45 0 - 53 U/L   Alkaline Phosphatase 51 39 - 117 U/L   Total Bilirubin 1.6 (H) 0.3 - 1.2 mg/dL   GFR calc non Af Amer 35 (L) >90 mL/min   GFR calc Af Amer 41 (L) >90 mL/min   Anion gap 6 5 - 15  CBC with Differential  Result Value Ref Range   WBC 10.1 4.0 - 10.5 K/uL   RBC 1.85 (L) 4.22 - 5.81 MIL/uL   Hemoglobin 5.4 (LL) 13.0 - 17.0 g/dL   HCT 16.0 (L) 39.0 - 52.0 %   MCV 86.5 78.0 - 100.0 fL   MCH 29.2 26.0 - 34.0 pg   MCHC 33.8 30.0 - 36.0 g/dL   RDW 15.9 (H) 11.5 - 15.5 %   Platelets 98 (L) 150 - 400 K/uL   Neutrophils Relative % PENDING 43 - 77  %   Neutro Abs PENDING 1.7 - 7.7 K/uL   Band Neutrophils PENDING 0 - 10 %   Lymphocytes Relative PENDING 12 - 46 %   Lymphs Abs PENDING 0.7 - 4.0 K/uL   Monocytes Relative PENDING 3 - 12 %   Monocytes Absolute PENDING 0.1 - 1.0  K/uL   Eosinophils Relative PENDING 0 - 5 %   Eosinophils Absolute PENDING 0.0 - 0.7 K/uL   Basophils Relative PENDING 0 - 1 %   Basophils Absolute PENDING 0.0 - 0.1 K/uL   WBC Morphology PENDING    RBC Morphology PENDING    Smear Review PENDING    nRBC PENDING 0 /100 WBC   Metamyelocytes Relative PENDING %   Myelocytes PENDING %   Promyelocytes Absolute PENDING %   Blasts PENDING %  Protime-INR  Result Value Ref Range   Prothrombin Time 19.3 (H) 11.6 - 15.2 seconds   INR 1.61 (H) 0.00 - 1.49  POC occult blood, ED Provider will collect  Result Value Ref Range   Fecal Occult Bld POSITIVE (A) NEGATIVE  Type and screen  Result Value Ref Range   ABO/RH(D) O POS    Antibody Screen PENDING    Sample Expiration 08/17/2014    Mr Lumbar Spine Wo Contrast  07/29/2014   CLINICAL DATA:  Low back pain, worse on the left than the right. Weakness and numbness in the left leg, approximately 1 year duration. Began after lifting heavy object.  EXAM: MRI LUMBAR SPINE WITHOUT CONTRAST  TECHNIQUE: Multiplanar, multisequence MR imaging of the lumbar spine was performed. No intravenous contrast was administered.  COMPARISON:  CT abdomen 07/26/2014  FINDINGS: T12-L1:  Normal.  Conus tip at this level.  L1-2: Retrolisthesis of 2 mm. Mild bulging of the disc. No compressive stenosis.  L2-3: Retrolisthesis of 2 mm. Mild bulging of the disc. No stenosis.  L3-4:  Mild bulging of the disc.  No compressive stenosis.  L4-5: Endplate osteophytes and shallow protrusion of disc material. Mild facet and ligamentous hypertrophy. Mild stenosis of both lateral recesses without definite neural compression.  L5-S1: Bilateral facet arthropathy with 5 mm of anterolisthesis. Disc degeneration with  circumferential bulging of the disc. The central canal sufficiently patent. Foraminal narrowing bilaterally that could compress either or both L5 nerve roots, more likely the left.  IMPRESSION: Non-compressive degenerative changes at L1-2, L2-3 and L3-4.  L4-5: Endplate osteophytes and shallow protrusion of disc material. Mild facet hypertrophy. Mild stenosis of both lateral recesses without definite neural compression.  L5-S1: Bilateral facet arthropathy with 5 mm anterolisthesis. Disc degeneration and bulging of the disc. Foraminal stenosis bilaterally, left more than right. Either L5 nerve root could be compressed, more likely the left.   Electronically Signed   By: Nelson Chimes M.D.   On: 07/29/2014 20:18   Ct Abdomen Pelvis W Contrast  07/26/2014   CLINICAL DATA:  Diffuse abdominal pain for 2 days  EXAM: CT ABDOMEN AND PELVIS WITH CONTRAST  TECHNIQUE: Multidetector CT imaging of the abdomen and pelvis was performed using the standard protocol following bolus administration of intravenous contrast.  CONTRAST:  126mL OMNIPAQUE IOHEXOL 300 MG/ML  SOLN  COMPARISON:  None.  FINDINGS: Lung bases are unremarkable.  Small hiatal hernia.  Sagittal images of the spine shows degenerative changes thoracolumbar spine. Significant disc space flattening with vacuum disc phenomenon at L4-L5 and L5-S1 level.  There is perihepatic and perisplenic ascites. Small ascites is noted in left paracolic gutter. Moderate ascites is noted in right paracolic gutter. Markedly nodular contour of the liver and mild hepatic atrophy consistent with cirrhosis. No focal hepatic mass. No calcified gallstones are noted within gallbladder. The spleen measures 12.5 cm in length. There are varices in proximal perigastric region. Portal vein measures 1.4 cm in diameter. Findings are suspicious for portal hypertension. There is small  umbilical hernia containing fat and small bowel lobe without evidence of acute complication. Subcutaneous edema is  noted in umbilical and periumbilical region. Minimal subcutaneous edema bilateral flank wall  Oral contrast material was given to the patient. No small bowel obstruction. No ascites or free air. No adenopathy. The pancreas and adrenal glands are unremarkable. Kidneys are symmetrical in size and enhancement. No hydronephrosis or hydroureter. Mild atherosclerotic calcifications of abdominal aorta and iliac arteries. No aortic aneurysm.  Moderate pelvic ascites. Urinary bladder is unremarkable. Prostate gland and seminal vesicles are unremarkable. Small nonspecific bilateral inguinal lymph nodes. No destructive bony lesions are noted within pelvis.  Delayed renal images shows mild delay excretion bilaterally.  IMPRESSION: 1. There is moderate perihepatic and perisplenic ascites. Mild hepatic atrophy with nodular contour of the liver highly suspicious for cirrhosis. Small varices are noted in perigastric and GE junction region. Portal vein measures 1.4 cm in diameter suspicious for portal hypertension. 2. Borderline splenomegaly with spleen measuring 12.5 cm in length. 3. No hydronephrosis or hydroureter. Mild delayed excretion bilateral kidneys. 4. Moderate ascites is noted in right paracolic gutter. Moderate pelvic ascites. 5. Small umbilical hernia containing fat and small bowel loop without evidence of acute complication. No small bowel obstruction. Mild subcutaneous edema umbilical and periumbilical region.   Electronically Signed   By: Lahoma Crocker M.D.   On: 07/26/2014 22:16   US Paracentesis  07/28/2014   INDICATION: Ascites  EXAM: ULTRASOUND-GUIDED PARACENTESIS  COMPARISON:  None.  MEDICATIONS: 10 cc 1% lidocaine  COMPLICATIONS: None immediate  TECHNIQUE: Informed written consent was obtained from the patient after a discussion of the risks, benefits and alternatives to treatment. A timeout was performed prior to the initiation of the procedure.  Initial ultrasound scanning demonstrates a large amount of  ascites within the right upper abdominal quadrant. The right upper abdomen was prepped and draped in the usual sterile fashion. 1% lidocaine with epinephrine was used for local anesthesia. Under direct ultrasound guidance, a 19 gauge, 7-cm, Yueh catheter was introduced. An ultrasound image was saved for documentation purposed. the paracentesis was performed. The catheter was removed and a dressing was applied. The patient tolerated the procedure well without immediate post procedural complication.  FINDINGS: A total of approximately 1.9 liters of cloudy yellow fluid was removed. Samples were sent to the laboratory as requested by the clinical team.  IMPRESSION: Successful ultrasound-guided paracentesis yielding 1.9 liters of peritoneal fluid.  Read by:  Lavonia Drafts [facility]   Electronically Signed   By: Markus Daft M.D.   On: 07/27/2014 12:22    1:20 PM patient complains of burning and epigastric area protonix oredered. Packed red cells ordered for emergent transfusion Results for orders placed or performed during the hospital encounter of 08/14/14  Protime-INR  Result Value Ref Range   Prothrombin Time SPECIMEN CONTAMINATED, UNABLE TO PERFORM TEST(S). 11.6 - 15.2 seconds   INR NOT CALCULATED 0.00 - 1.49  Comprehensive metabolic panel  Result Value Ref Range   Sodium 145 135 - 145 mmol/L   Potassium 5.6 (H) 3.5 - 5.1 mmol/L   Chloride 116 (H) 96 - 112 mmol/L   CO2 23 19 - 32 mmol/L   Glucose, Bld 101 (H) 70 - 99 mg/dL   BUN 46 (H) 6 - 23 mg/dL   Creatinine, Ser 2.01 (H) 0.50 - 1.35 mg/dL   Calcium 8.0 (L) 8.4 - 10.5 mg/dL   Total Protein 5.4 (L) 6.0 - 8.3 g/dL   Albumin 1.9 (L) 3.5 - 5.2  g/dL   AST 114 (H) 0 - 37 U/L   ALT 45 0 - 53 U/L   Alkaline Phosphatase 51 39 - 117 U/L   Total Bilirubin 1.6 (H) 0.3 - 1.2 mg/dL   GFR calc non Af Amer 35 (L) >90 mL/min   GFR calc Af Amer 41 (L) >90 mL/min   Anion gap 6 5 - 15  CBC with Differential  Result Value Ref Range   WBC 10.1 4.0 - 10.5 K/uL    RBC 1.85 (L) 4.22 - 5.81 MIL/uL   Hemoglobin 5.4 (LL) 13.0 - 17.0 g/dL   HCT 16.0 (L) 39.0 - 52.0 %   MCV 86.5 78.0 - 100.0 fL   MCH 29.2 26.0 - 34.0 pg   MCHC 33.8 30.0 - 36.0 g/dL   RDW 15.9 (H) 11.5 - 15.5 %   Platelets 98 (L) 150 - 400 K/uL   Neutrophils Relative % 61 43 - 77 %   Lymphocytes Relative 28 12 - 46 %   Monocytes Relative 11 3 - 12 %   Eosinophils Relative 0 0 - 5 %   Basophils Relative 0 0 - 1 %   Neutro Abs 6.2 1.7 - 7.7 K/uL   Lymphs Abs 2.8 0.7 - 4.0 K/uL   Monocytes Absolute 1.1 (H) 0.1 - 1.0 K/uL   Eosinophils Absolute 0.0 0.0 - 0.7 K/uL   Basophils Absolute 0.0 0.0 - 0.1 K/uL   RBC Morphology POLYCHROMASIA PRESENT    Smear Review PLATELET COUNT CONFIRMED BY SMEAR   Protime-INR  Result Value Ref Range   Prothrombin Time 19.3 (H) 11.6 - 15.2 seconds   INR 1.61 (H) 0.00 - 1.49  POC occult blood, ED Provider will collect  Result Value Ref Range   Fecal Occult Bld POSITIVE (A) NEGATIVE  Type and screen  Result Value Ref Range   ABO/RH(D) O POS    Antibody Screen NEG    Sample Expiration 08/17/2014    Unit Number [id number]    Blood Component Type RED CELLS,LR    Unit division 00    Status of Unit ALLOCATED    Transfusion Status OK TO TRANSFUSE    Crossmatch Result Compatible    Unit Number [id number]    Blood Component Type RED CELLS,LR    Unit division 00    Status of Unit ALLOCATED    Transfusion Status OK TO TRANSFUSE    Crossmatch Result Compatible    Unit Number [id number]    Blood Component Type RED CELLS,LR    Unit division 00    Status of Unit ALLOCATED    Transfusion Status OK TO TRANSFUSE    Crossmatch Result Compatible   ABO/Rh  Result Value Ref Range   ABO/RH(D) O POS   Prepare RBC  Result Value Ref Range   Order Confirmation ORDER PROCESSED BY BLOOD BANK    Mr Lumbar Spine Wo Contrast  07/29/2014   CLINICAL DATA:  Low back pain, worse on the left than the right. Weakness and numbness in the left leg, approximately 1 year  duration. Began after lifting heavy object.  EXAM: MRI LUMBAR SPINE WITHOUT CONTRAST  TECHNIQUE: Multiplanar, multisequence MR imaging of the lumbar spine was performed. No intravenous contrast was administered.  COMPARISON:  CT abdomen 07/26/2014  FINDINGS: T12-L1:  Normal.  Conus tip at this level.  L1-2: Retrolisthesis of 2 mm. Mild bulging of the disc. No compressive stenosis.  L2-3: Retrolisthesis of 2 mm. Mild bulging of the disc. No stenosis.  L3-4:  Mild bulging of the disc.  No compressive stenosis.  L4-5: Endplate osteophytes and shallow protrusion of disc material. Mild facet and ligamentous hypertrophy. Mild stenosis of both lateral recesses without definite neural compression.  L5-S1: Bilateral facet arthropathy with 5 mm of anterolisthesis. Disc degeneration with circumferential bulging of the disc. The central canal sufficiently patent. Foraminal narrowing bilaterally that could compress either or both L5 nerve roots, more likely the left.  IMPRESSION: Non-compressive degenerative changes at L1-2, L2-3 and L3-4.  L4-5: Endplate osteophytes and shallow protrusion of disc material. Mild facet hypertrophy. Mild stenosis of both lateral recesses without definite neural compression.  L5-S1: Bilateral facet arthropathy with 5 mm anterolisthesis. Disc degeneration and bulging of the disc. Foraminal stenosis bilaterally, left more than right. Either L5 nerve root could be compressed, more likely the left.   Electronically Signed   By: Nelson Chimes M.D.   On: 07/29/2014 20:18   Ct Abdomen Pelvis W Contrast  07/26/2014   CLINICAL DATA:  Diffuse abdominal pain for 2 days  EXAM: CT ABDOMEN AND PELVIS WITH CONTRAST  TECHNIQUE: Multidetector CT imaging of the abdomen and pelvis was performed using the standard protocol following bolus administration of intravenous contrast.  CONTRAST:  114mL OMNIPAQUE IOHEXOL 300 MG/ML  SOLN  COMPARISON:  None.  FINDINGS: Lung bases are unremarkable.  Small hiatal hernia.   Sagittal images of the spine shows degenerative changes thoracolumbar spine. Significant disc space flattening with vacuum disc phenomenon at L4-L5 and L5-S1 level.  There is perihepatic and perisplenic ascites. Small ascites is noted in left paracolic gutter. Moderate ascites is noted in right paracolic gutter. Markedly nodular contour of the liver and mild hepatic atrophy consistent with cirrhosis. No focal hepatic mass. No calcified gallstones are noted within gallbladder. The spleen measures 12.5 cm in length. There are varices in proximal perigastric region. Portal vein measures 1.4 cm in diameter. Findings are suspicious for portal hypertension. There is small umbilical hernia containing fat and small bowel lobe without evidence of acute complication. Subcutaneous edema is noted in umbilical and periumbilical region. Minimal subcutaneous edema bilateral flank wall  Oral contrast material was given to the patient. No small bowel obstruction. No ascites or free air. No adenopathy. The pancreas and adrenal glands are unremarkable. Kidneys are symmetrical in size and enhancement. No hydronephrosis or hydroureter. Mild atherosclerotic calcifications of abdominal aorta and iliac arteries. No aortic aneurysm.  Moderate pelvic ascites. Urinary bladder is unremarkable. Prostate gland and seminal vesicles are unremarkable. Small nonspecific bilateral inguinal lymph nodes. No destructive bony lesions are noted within pelvis.  Delayed renal images shows mild delay excretion bilaterally.  IMPRESSION: 1. There is moderate perihepatic and perisplenic ascites. Mild hepatic atrophy with nodular contour of the liver highly suspicious for cirrhosis. Small varices are noted in perigastric and GE junction region. Portal vein measures 1.4 cm in diameter suspicious for portal hypertension. 2. Borderline splenomegaly with spleen measuring 12.5 cm in length. 3. No hydronephrosis or hydroureter. Mild delayed excretion bilateral  kidneys. 4. Moderate ascites is noted in right paracolic gutter. Moderate pelvic ascites. 5. Small umbilical hernia containing fat and small bowel loop without evidence of acute complication. No small bowel obstruction. Mild subcutaneous edema umbilical and periumbilical region.   Electronically Signed   By: Lahoma Crocker M.D.   On: 07/26/2014 22:16   US Paracentesis  07/28/2014   INDICATION: Ascites  EXAM: ULTRASOUND-GUIDED PARACENTESIS  COMPARISON:  None.  MEDICATIONS: 10 cc 1% lidocaine  COMPLICATIONS: None immediate  TECHNIQUE: Informed written consent  was obtained from the patient after a discussion of the risks, benefits and alternatives to treatment. A timeout was performed prior to the initiation of the procedure.  Initial ultrasound scanning demonstrates a large amount of ascites within the right upper abdominal quadrant. The right upper abdomen was prepped and draped in the usual sterile fashion. 1% lidocaine with epinephrine was used for local anesthesia. Under direct ultrasound guidance, a 19 gauge, 7-cm, Yueh catheter was introduced. An ultrasound image was saved for documentation purposed. the paracentesis was performed. The catheter was removed and a dressing was applied. The patient tolerated the procedure well without immediate post procedural complication.  FINDINGS: A total of approximately 1.9 liters of cloudy yellow fluid was removed. Samples were sent to the laboratory as requested by the clinical team.  IMPRESSION: Successful ultrasound-guided paracentesis yielding 1.9 liters of peritoneal fluid.  Read by:  Lavonia Drafts [facility]   Electronically Signed   By: Markus Daft M.D.   On: 07/27/2014 12:22    MDM  Spoke with Dr,. Coralyn Pear who will arraNGE FOR ADMISSION TO STEP DOWN UNIT. PT SHOULD BE WATCHED FOR ALCOHOL WITHDRAWAL. ALSO SPOKE WITH dR STARK , GI WHO WILL EVALUTE PT Final diagnoses:  None   dX #1 ACUTE UPPER GI BLEED #2 COAGULOPATHTHY #3RENAL INSUFFICIENCY #4 MILD HYPERKALEMIA #5  ELEVATED LFTS #6 THROMBOCYTOPENIA #7 hyperbilirubinemia CRITICAL CARE Performed by: Orlie Dakin Total critical care time: 50 MINUTE Critical care time was exclusive of separately billable procedures and treating other patients. Critical care was necessary to treat or prevent imminent or life-threatening deterioration. Critical care was time spent personally by me on the following activities: development of treatment plan with patient and/or surrogate as well as nursing, discussions with consultants, evaluation of patient's response to treatment, examination of patient, obtaining history from patient or surrogate, ordering and performing treatments and interventions, ordering and review of laboratory studies, ordering and review of radiographic studies, pulse oximetry and re-evaluation of patient's condition.  Orlie Dakin, MD 08/14/14 [phone]

## 2014-08-14 NOTE — Consult Note (Addendum)
Consult Note   Referring Provider:  [facility] Primary Care Physician: Angelica Chessman, MD Primary Gastroenterologist:  Mayo Ao  Reason for Consultation:  GI bleeding, cirrhosis, anemia  HPI: John [name] is a 59 y.o. male presented to ED with hematemesis and maroon blood per rectum for 2-3 days. He has a history of polysubstance abuse including etoh. He was recently discharged from [facility] on 3/31 for new onset ascites. Cirrhosis likely secondary to Hep C and alcohol. Says only etoh has had 1 beer since discharge on 3/31. Paracentesis last admission showed low WBC and SAAG c/w portal hypertension. CT on 3/29 showed cirrhotic liver, bordeline enlarged spleen, ascites, small perigastric and GE junction varices, umbilical hernia, 1.4 cm portal vein. He takes Aleve and ASA on a daily basis.    Past Medical History  Diagnosis Date  . Hypertension   . Chronic back pain   . Stroke 1998  . Polysubstance abuse   . Optic neuropathy, left   . CHF (congestive heart failure)   . Ascites   . ETOH abuse     Past Surgical History  Procedure Laterality Date  . None      Prior to Admission medications   Medication Sig Start Date End Date Taking? Authorizing Provider  amLODipine (NORVASC) 10 MG tablet Take 1 tablet (10 mg total) by mouth daily. 06/27/14   Ripudeep Krystal Eaton, MD  aspirin EC 325 MG EC tablet Take 1 tablet (325 mg total) by mouth daily. 06/27/14   Ripudeep Krystal Eaton, MD  furosemide (LASIX) 20 MG tablet Take 1 tablet (20 mg total) by mouth daily. 07/28/14   Monica Becton, MD  metoprolol tartrate (LOPRESSOR) 25 MG tablet Take 2 tablets (50 mg total) by mouth 2 (two) times daily. 07/28/14   Monica Becton, MD  Naproxen Sodium (ALEVE PO) Take by mouth.    Historical Provider, MD  spironolactone (ALDACTONE) 50 MG tablet Take 1 tablet (50 mg total) by mouth daily. 07/28/14   Monica Becton, MD  traMADol (ULTRAM) 50 MG tablet Take 1 tablet (50 mg total) by mouth every 12  (twelve) hours as needed for moderate pain or severe pain. 06/27/14   Ripudeep Krystal Eaton, MD  UNABLE TO FIND Outpatient Physical and occupational therapy   Diagnosis: left sided vision loss, optic neuritis 06/29/14   Ripudeep Krystal Eaton, MD    Current Facility-Administered Medications  Medication Dose Route Frequency Provider Last Rate Last Dose  . 0.9 %  sodium chloride infusion   Intravenous Continuous Orlie Dakin, MD 150 mL/hr at 08/14/14 1152    . 0.9 %  sodium chloride infusion   Intravenous Once Kelvin Cellar, MD       Current Outpatient Prescriptions  Medication Sig Dispense Refill  . amLODipine (NORVASC) 10 MG tablet Take 1 tablet (10 mg total) by mouth daily. 30 tablet 4  . aspirin EC 325 MG EC tablet Take 1 tablet (325 mg total) by mouth daily. 30 tablet 4  . furosemide (LASIX) 20 MG tablet Take 1 tablet (20 mg total) by mouth daily. 30 tablet 0  . metoprolol tartrate (LOPRESSOR) 25 MG tablet Take 2 tablets (50 mg total) by mouth 2 (two) times daily. 60 tablet 0  . Naproxen Sodium (ALEVE PO) Take by mouth.    . spironolactone (ALDACTONE) 50 MG tablet Take 1 tablet (50 mg total) by mouth daily. 30 tablet 0  . traMADol (ULTRAM) 50 MG tablet Take 1 tablet (50 mg total) by mouth every 12 (twelve) hours as needed  for moderate pain or severe pain. 30 tablet 0  . UNABLE TO FIND Outpatient Physical and occupational therapy   Diagnosis: left sided vision loss, optic neuritis 1 Mutually Defined 0    Allergies as of 08/14/2014 - Review Complete 08/14/2014  Allergen Reaction Noted  . Penicillins  12/16/2012    Family History  Problem Relation Age of Onset  . Hypertension Mother     Living  . Hypertension Brother   . Kidney disease Mother   . Kidney disease Brother   . Hypertension Father   . Hypertension Father     Deceased, 63  . Hypertension Sister   . Ulcers Father     History   Social History  . Marital Status: Single    Spouse Name: N/A  . Number of Children: N/A  .  Years of Education: N/A   Occupational History  . Not on file.   Social History Main Topics  . Smoking status: Former Smoker -- 0.30 packs/day for 20 years    Types: Cigarettes  . Smokeless tobacco: Current User    Types: Snuff     Comment: 2015  . Alcohol Use: 0.0 oz/week    0 Standard drinks or equivalent per week     Comment: occ  . Drug Use: Yes    Special: Cocaine, Marijuana     Comment: since fall 2015  . Sexual Activity: Not on file   Other Topics Concern  . Not on file   Social History Narrative   Lives with niece in a 2 story home.     On disability since 2015 for low back pain.  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 Resp: Denies dyspnea at rest, dyspnea with exercise, cough, sputum, wheezing, coughing up blood, and pleurisy. GI: Denies jaundice, and fecal incontinence.  Denies dysphagia or odynophagia. GU : Denies urinary burning, blood in urine, urinary frequency, urinary hesitancy, nocturnal urination, and urinary incontinence. MS: Denies joint pain, limitation of movement, and swelling, stiffness, low back pain, extremity pain. Denies muscle weakness, cramps, atrophy.  Derm: Denies rash, itching, dry skin, hives, moles, warts, or unhealing ulcers.  Psych: Denies depression, anxiety, memory loss, suicidal ideation, hallucinations, paranoia, and confusion. Heme: Denies bruising, bleeding, and enlarged lymph nodes. Neuro:  Denies any headaches, dizziness, paresthesias. Endo:  Denies any problems with DM, thyroid, adrenal function.  Physical Exam: Vital signs in last 24 hours: Temp:  [98.3 F (36.8 C)-100.1 F (37.8 C)] 98.6 F (37 C) (04/17 1448) Pulse Rate:  [74-80] 75 (04/17 1515) Resp:   [16-20] 19 (04/17 1515) BP: (106-151)/(60-79) 151/76 mmHg (04/17 1515) SpO2:  [100 %] 100 % (04/17 1515)   General:   Alert, well-developed, acutely ill appearing Head:  Normocephalic and atraumatic. Eyes:  Sclera clear, no icterus.   Conjunctiva pink. Ears:  Normal auditory acuity. Nose:  No deformity, discharge, or lesions. Mouth:  No deformity or lesions.  Oropharynx pink & moist. Neck:  Supple; no masses or thyromegaly. Lungs:  Clear throughout to auscultation.  No wheezes, crackles, or rhonchi. No acute distress. Heart:  Regular rate and rhythm; no murmurs, clicks, rubs,  or gallops. Abdomen:  Soft, mild epigastric tenderness and nondistended. No masses, hepatosplenomegaly or hernias noted. Normal bowel sounds, without guarding, and without  rebound.   Rectal:  Maroon blood, no lesions   Msk:  Symmetrical without gross deformities. Normal posture. Pulses:  Normal pulses noted. Extremities:  Without clubbing or edema. Neurologic:  Alert and  oriented x4;  grossly normal neurologically. Skin:  Intact without significant lesions or rashes. Cervical Nodes:  No significant cervical adenopathy. Psych:  Alert and cooperative. Normal mood and affect.  Intake/Output from previous day:   Intake/Output this shift: Total I/O In: 835 [I.V.:500; Blood:335] Out: -   Lab Results:  Recent Labs  08/14/14 1233  WBC 10.1  HGB 5.4*  HCT 16.0*  PLT 98*   BMET  Recent Labs  08/14/14 1233  NA 145  K 5.6*  CL 116*  CO2 23  GLUCOSE 101*  BUN 46*  CREATININE 2.01*  CALCIUM 8.0*   LFT  Recent Labs  08/14/14 1233  PROT 5.4*  ALBUMIN 1.9*  AST 114*  ALT 45  ALKPHOS 51  BILITOT 1.6*   PT/INR  Recent Labs  08/14/14 1150 08/14/14 1233  LABPROT SPECIMEN CONTAMINATED, UNABLE TO PERFORM TEST(S). 19.3*  INR NOT CALCULATED 1.61*    Previous Endoscopies: none  Impression/ Recommendations:  1. Acute UGI bleed with presumed cirrhosis and ABL anemia. R/O ulcer, varices, MW  tear, etc. Recent CT scan showed perigastric and GE junction varices. IV octreotide bolus/infusion, IV PPI bolus/infusion, IV antibiotics. EGD urgently this afternoon. Avoid all ASA/NSAIDs. ICU admission.   2. Cirrhosis with ascites and coagulopathy, thrombocytopenia, varices. Possible etoh hepatitis. Hep C and etoh abuse are likely cause of cirrhosis. Vit K. Monitor for etoh withdrawal.   3. ABL anemia. Transfusions to keep Hb > 8.   4. Epigastric tenderness. Possibly related to etoh hepatitis, ulcer, etc. Consider repeat imaging studies if symptoms persist.   5. AKI. Likely volume related.     LOS: 0 days   Anaisa Radi T. Fuller Plan MD 08/14/2014, 3:25 PM

## 2014-08-14 NOTE — ED Notes (Signed)
Received call from lab, they stated blood sample we sent earlier was possibly contaminated and to redraw all labs. Blood redrawn and sent to lab.

## 2014-08-14 NOTE — Op Note (Signed)
[facility] [place], [id number]   ENDOSCOPY PROCEDURE REPORT  PATIENT: John, [name]  MR#: [id number] BIRTHDATE: 03/06/1956 , 37  yrs. old GENDER: male ENDOSCOPIST: Ladene Artist, MD, [facility] REFERRED BY: PROCEDURE DATE:  08/14/2014 PROCEDURE:  EGD, diagnostic ASA CLASS:     Class III INDICATIONS:  hematemesis, hematochezia, and cirrhosis. MEDICATIONS: Benadryl 25 mg IV, Fentanyl 50 mcg IV, and Versed 4 mg IV TOPICAL ANESTHETIC: Cetacaine Spray DESCRIPTION OF PROCEDURE: After the risks benefits and alternatives of the procedure were thoroughly explained, informed consent was obtained.  The Pentax Gastroscope [id number] endoscope was introduced through the mouth and advanced to the second portion of the duodenum , Limited by Blood clots in the gastric fundus and proximal gastric body..  The instrument was slowly withdrawn as the mucosa was fully examined.    ESOPHAGUS: The mucosa of the esophagus appeared normal.   No esophageal varices. 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  The stomach otherwise appeared normal, limited by blood clots in fundus and body obscuring parts of these areas. DUODENUM: The duodenal mucosa showed no abnormalities.  Retroflexed views revealed as previously described.   The scope was then withdrawn from the patient and the procedure completed.  COMPLICATIONS: There were no immediate complications.  ENDOSCOPIC IMPRESSION: 1.   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   The EGD otherwise appeared normal  RECOMMENDATIONS: 1.  PPI gtt 2.  Octreotide gtt 3.  IR consult called for possible TIPS and/or BRTO  eSigned:  Ladene Artist, MD, [facility] 08/14/2014 4:36 PM

## 2014-08-14 NOTE — H&P (Signed)
[facility] History and Physical  John [name] LTR:[id number] DOB: Mar 11, 1956 DOA: 08/14/2014  Referring physician:  PCP: No PCP Per Patient   Chief Complaint: Hematemesis/bright red blood per rectum  HPI: John [name] is a 59 y.o. male with a past medical history of alcoholism, cirrhosis, coagulopathy, polysubstance abuse who was recently discharged from the hospitalist service on 07/28/2014 at which time he was treated for new onset ascites undergoing paracentesis with removal of 2 L of ascitic fluid. He presents to the emergency department with complaints of hematemesis over the past 3 days as well as bright red blood per rectum. He was noted have several episodes of bloody/maroon colored liquid consistency stools in the emergency department. CBC showed a hemoglobin of 5.4 with hematocrit of 16 and platelet count of 98,000. He has an INR 1.61 with PT of 19.3. He was urgently typed and crossed and received his first unit of packed red blood cells while in the emergency department. Last blood pressure reading was 125/74 with a pulse of 80, satting 100% on 2 L up in oxygen via nasal cannula. Patient also complains of abdominal pain located in the epigastric region. He takes Aleve as well as aspirin on a daily basis, and tells me he has only had one beer since his last hospitalization. I spoke with Dr. Fuller Plan of GI from the emergency department.                                                                                                Review of Systems:  Constitutional:  No weight loss, night sweats, Fevers, chills, positive for fatigue, poor tolerance to physical exertion.  HEENT:  No headaches, Difficulty swallowing,Tooth/dental problems,Sore throat,  No sneezing, itching, ear ache, nasal congestion, post nasal drip,  Cardio-vascular:  No chest pain, Orthopnea, PND, swelling in lower extremities, anasarca, dizziness, palpitations  GI:  No heartburn, indigestion, positive for  abdominal pain, nausea, vomiting, hematemesis and bloody stools. diarrhea, change in bowel habits, loss of appetite  Resp:  No shortness of breath with exertion or at rest. No excess mucus, no productive cough, No non-productive cough, No coughing up of blood.No change in color of mucus.No wheezing.No chest wall deformity  Skin:  no rash or lesions.  GU:  no dysuria, change in color of urine, no urgency or frequency. No flank pain.  Musculoskeletal:  No joint pain or swelling. No decreased range of motion. No back pain.  Psych:  No change in mood or affect. No depression or anxiety. No memory loss.   Past Medical History  Diagnosis Date  . Hypertension   . Chronic back pain   . Stroke 1998  . Polysubstance abuse   . Optic neuropathy, left   . CHF (congestive heart failure)   . Ascites   . ETOH abuse    Past Surgical History  Procedure Laterality Date  . None     Social History:  reports that he has quit smoking. His smoking use included Cigarettes. He has a 6 pack-year smoking history. His smokeless tobacco use includes Snuff. He reports that he drinks alcohol. He reports  that he uses illicit drugs (Cocaine and Marijuana).  Allergies  Allergen Reactions  . Penicillins     Convulsions    Family History  Problem Relation Age of Onset  . Hypertension Mother     Living  . Hypertension Brother   . Kidney disease Mother   . Kidney disease Brother   . Hypertension Father   . Hypertension Father     Deceased, 74  . Hypertension Sister   . Ulcers Father     Prior to Admission medications   Medication Sig Start Date End Date Taking? Authorizing Provider  amLODipine (NORVASC) 10 MG tablet Take 1 tablet (10 mg total) by mouth daily. 06/27/14   Ripudeep Krystal Eaton, MD  aspirin EC 325 MG EC tablet Take 1 tablet (325 mg total) by mouth daily. 06/27/14   Ripudeep Krystal Eaton, MD  furosemide (LASIX) 20 MG tablet Take 1 tablet (20 mg total) by mouth daily. 07/28/14   Monica Becton, MD    metoprolol tartrate (LOPRESSOR) 25 MG tablet Take 2 tablets (50 mg total) by mouth 2 (two) times daily. 07/28/14   Monica Becton, MD  Naproxen Sodium (ALEVE PO) Take by mouth.    Historical Provider, MD  spironolactone (ALDACTONE) 50 MG tablet Take 1 tablet (50 mg total) by mouth daily. 07/28/14   Monica Becton, MD  traMADol (ULTRAM) 50 MG tablet Take 1 tablet (50 mg total) by mouth every 12 (twelve) hours as needed for moderate pain or severe pain. 06/27/14   Ripudeep Krystal Eaton, MD  UNABLE TO FIND Outpatient Physical and occupational therapy   Diagnosis: left sided vision loss, optic neuritis 06/29/14   Ripudeep Krystal Eaton, MD   Physical Exam: Filed Vitals:   08/14/14 1107 08/14/14 1331 08/14/14 1405 08/14/14 1428  BP: 106/60 129/64 121/77 125/74  Pulse: 80 74 79 80  Temp: 98.6 F (37 C) 98.3 F (36.8 C)  100.1 F (37.8 C)  TempSrc: Oral Oral  Oral  Resp: 18 18 20 16   SpO2: 100% 100% 100% 100%    Wt Readings from Last 3 Encounters:  07/27/14 122.426 kg (269 lb 14.4 oz)  07/11/14 122.726 kg (270 lb 9 oz)  06/24/14 113.399 kg (250 lb)    General:  Ill-appearing, having pallor, is awake and alert and can't provide history. Eyes: PERRL, normal lids, irises & conjunctiva ENT: grossly normal hearing, lips & tongue, dry oral mucosa Neck: no LAD, masses or thyromegaly Cardiovascular: Tachycardic, RRR, no m/r/g. Trace edema to lower extremities bilaterally Telemetry: SR, no arrhythmias  Respiratory: CTA bilaterally, no w/r/r. Normal respiratory effort. Abdomen: Patient having mild to moderate tenderness to palpation over epigastric region, no rebound tenderness or guarding. Skin: no rash or induration seen on limited exam Musculoskeletal: grossly normal tone BUE/BLE Psychiatric: grossly normal mood and affect, speech fluent and appropriate Neurologic: grossly non-focal.          Labs on Admission:  Basic Metabolic Panel:  Recent Labs Lab 08/14/14 1233  NA 145  K 5.6*  CL 116*   CO2 23  GLUCOSE 101*  BUN 46*  CREATININE 2.01*  CALCIUM 8.0*   Liver Function Tests:  Recent Labs Lab 08/14/14 1233  AST 114*  ALT 45  ALKPHOS 51  BILITOT 1.6*  PROT 5.4*  ALBUMIN 1.9*   No results for input(s): LIPASE, AMYLASE in the last 168 hours. No results for input(s): AMMONIA in the last 168 hours. CBC:  Recent Labs Lab 08/14/14 1233  WBC 10.1  NEUTROABS 6.2  HGB  5.4*  HCT 16.0*  MCV 86.5  PLT 98*   Cardiac Enzymes: No results for input(s): CKTOTAL, CKMB, CKMBINDEX, TROPONINI in the last 168 hours.  BNP (last 3 results)  Recent Labs  06/24/14 0802  BNP 1168.5*    ProBNP (last 3 results) No results for input(s): PROBNP in the last 8760 hours.  CBG: No results for input(s): GLUCAP in the last 168 hours.  Radiological Exams on Admission: No results found.  EKG: Independently reviewed.   Assessment/Plan Principal Problem:   Upper GI bleed Active Problems:   Acute blood loss anemia   Cirrhosis   Alcohol abuse   Coagulopathy   AKI (acute kidney injury)   GI bleed   GIB (gastrointestinal bleeding)   1. Suspected upper GI bleed. Patient is a 59 year old gentleman with a past medical history of alcohol abuse and cirrhosis, presenting to the emergency department with complaints of a 3 day history of hematemesis associated with bright red blood per rectum which is concerning for a brisk bleed involving upper GI tract. Given his history this could be secondary to esophageal varices. Also on the differential includes peptic ulcer disease with his history of alcohol abuse and chronic NSAID use with Aleve. Will start patient on Protonix drip as well as Octreotide drip. He has already been typed and crossed and will transfuse 3 units of packed red blood cells. Will provide 10 mg of IV vitamin K. Case has been discussed with Dr Fuller Plan of GI who will evaluate patient and likely take him to undergo upper endoscopy this afternoon. 2. Acute blood loss anemia.  Patient with a history of chronic alcoholism presenting to the emergency department with a hemoglobin of 5.4. Labs also revealed a platelet count of 98,000, INR of 1.6. Patient was typed and crossed and blood transfusion was started in the emergency department. His mention above source of blood loss likely upper GI bleed which could be due to esophageal varices versus ulcers. Patient will be admitted to the intensive care unit for close monitoring. 3. History of cirrhosis with ascites. Patient having a history of alcoholism having new onset ascites last month where 2 L of ascitic fluid were removed on paracentesis. He presents with upper GI bleed making esophageal varices a possibility. He is quite empathic having INR 1.6 and will be given 10 mg of IV vitamin K given active bleed. Platelet count was 98,000. Will discontinue diuretic therapy as he is likely volume depleted from active GI bleed. Await upper EGD results. Provide blood transfusion, IV fluid resuscitation and supportive care. 4. Acute kidney injury. Patient presenting with creatinine of 2.01, increased from 1.49 on 07/27/2014. Likely secondary to hypovolemia in setting of upper GI bleed. He is currently being resuscitated with blood and IV fluid. This also may have been driven by his chronic NSAIDs use. Repeat labs in a.m. 5. Elevated transaminases. Labs showing an elevated AST 114 with ALT of 45. This ratio would b consistent with alcoholic hepatitis.  6. Chronic alcoholism. Will check an alcohol level, he states having just one by mouth since his last hospitalization. Will place him on CIWA protocol as he likely has high risk for going into alcohol withdrawals. 7. Hypertension. Given active GI bleed I will discontinue metoprolol and Norvasc 8. DVT prophylaxis. SCDs   Code Status: Full code Family Communication: Family not present Disposition Plan: Patient critically ill, despite work were greater than 2 nights hospitalization, will admit to  intensive care unit  Time spent: 70 min  Debroah Shuttleworth,  [facility] Pager [phone]

## 2014-08-14 NOTE — ED Notes (Signed)
Per EMS, Pt, from home, c/o coffee ground emesis and tarry stools x 3 days.  Denies pain.  Pt denies previous episodes.

## 2014-08-15 ENCOUNTER — Encounter (HOSPITAL_COMMUNITY)
Admission: EM | Disposition: A | Discharge status: Skilled Nursing Facility | Payer: Self-pay | Source: Home / Self Care | Attending: Internal Medicine

## 2014-08-15 ENCOUNTER — Inpatient Hospital Stay (HOSPITAL_COMMUNITY): Payer: Medicaid Other | Admitting: Certified Registered Nurse Anesthetist

## 2014-08-15 ENCOUNTER — Inpatient Hospital Stay (HOSPITAL_COMMUNITY): Payer: Medicaid Other

## 2014-08-15 ENCOUNTER — Encounter (HOSPITAL_COMMUNITY): Payer: Self-pay | Admitting: Gastroenterology

## 2014-08-15 DIAGNOSIS — I864 Gastric varices: Secondary | ICD-10-CM | POA: Diagnosis present

## 2014-08-15 HISTORY — PX: RADIOLOGY WITH ANESTHESIA: SHX6223

## 2014-08-15 LAB — BASIC METABOLIC PANEL
Anion gap: 9 (ref 5–15)
BUN: 48 mg/dL — AB (ref 6–23)
CO2: 21 mmol/L (ref 19–32)
Calcium: 8 mg/dL — ABNORMAL LOW (ref 8.4–10.5)
Chloride: 114 mmol/L — ABNORMAL HIGH (ref 96–112)
Creatinine, Ser: 2.51 mg/dL — ABNORMAL HIGH (ref 0.50–1.35)
GFR calc Af Amer: 31 mL/min — ABNORMAL LOW (ref >90)
GFR, EST NON AFRICAN AMERICAN: 27 mL/min — AB (ref >90)
GLUCOSE: 131 mg/dL — AB (ref 70–99)
POTASSIUM: 4.5 mmol/L (ref 3.5–5.1)
Sodium: 144 mmol/L (ref 135–145)

## 2014-08-15 LAB — PREPARE RBC (CROSSMATCH)

## 2014-08-15 LAB — CBC
HCT: 23.1 % — ABNORMAL LOW (ref 39.0–52.0)
HCT: 22.7 % — ABNORMAL LOW (ref 39.0–52.0)
HEMOGLOBIN: 8.2 g/dL — AB (ref 13.0–17.0)
Hemoglobin: 7.8 g/dL — ABNORMAL LOW (ref 13.0–17.0)
MCH: 29.5 pg (ref 26.0–34.0)
MCH: 29 pg (ref 26.0–34.0)
MCHC: 35.5 g/dL (ref 30.0–36.0)
MCHC: 34.4 g/dL (ref 30.0–36.0)
MCV: 83.1 fL (ref 78.0–100.0)
MCV: 84.4 fL (ref 78.0–100.0)
PLATELETS: 72 10*3/uL — AB (ref 150–400)
PLATELETS: 63 10*3/uL — AB (ref 150–400)
RBC: 2.78 MIL/uL — ABNORMAL LOW (ref 4.22–5.81)
RBC: 2.69 MIL/uL — ABNORMAL LOW (ref 4.22–5.81)
RDW: 16.8 % — ABNORMAL HIGH (ref 11.5–15.5)
RDW: 17.5 % — AB (ref 11.5–15.5)
WBC: 8.7 10*3/uL (ref 4.0–10.5)
WBC: 6.7 10*3/uL (ref 4.0–10.5)

## 2014-08-15 LAB — MRSA PCR SCREENING: MRSA by PCR: NEGATIVE

## 2014-08-15 LAB — GLUCOSE, CAPILLARY: GLUCOSE-CAPILLARY: 113 mg/dL — AB (ref 70–99)

## 2014-08-15 LAB — ABO/RH: ABO/RH(D): O POS

## 2014-08-15 SURGERY — RADIOLOGY WITH ANESTHESIA
Anesthesia: General

## 2014-08-15 MED ORDER — HYDROMORPHONE HCL 1 MG/ML IJ SOLN
INTRAMUSCULAR | Status: AC
  Filled 2014-08-15: qty 1

## 2014-08-15 MED ORDER — FENTANYL CITRATE (PF) 100 MCG/2ML IJ SOLN
INTRAMUSCULAR | Status: DC | PRN
  Administered 2014-08-15: 100 ug via INTRAVENOUS
  Administered 2014-08-15 (×3): 50 ug via INTRAVENOUS

## 2014-08-15 MED ORDER — VANCOMYCIN HCL 10 G IV SOLR: 1500.0000 mg | INTRAVENOUS | Status: DC

## 2014-08-15 MED ORDER — ONDANSETRON HCL 4 MG/2ML IJ SOLN
INTRAMUSCULAR | Status: DC | PRN
  Administered 2014-08-15: 4 mg via INTRAVENOUS

## 2014-08-15 MED ORDER — SODIUM CHLORIDE 0.9 % IV SOLN: INTRAVENOUS | Status: DC

## 2014-08-15 MED ORDER — MIDAZOLAM HCL 5 MG/5ML IJ SOLN
INTRAMUSCULAR | Status: DC | PRN
  Administered 2014-08-15: 2 mg via INTRAVENOUS

## 2014-08-15 MED ORDER — PHENYLEPHRINE HCL 10 MG/ML IJ SOLN
INTRAMUSCULAR | Status: DC | PRN
  Administered 2014-08-15: 40 ug via INTRAVENOUS
  Administered 2014-08-15: 80 ug via INTRAVENOUS
  Administered 2014-08-15: 40 ug via INTRAVENOUS

## 2014-08-15 MED ORDER — OXYCODONE HCL 5 MG PO TABS: 5.0000 mg | ORAL_TABLET | Freq: Once | ORAL | Status: DC | PRN

## 2014-08-15 MED ORDER — NEOSTIGMINE METHYLSULFATE 10 MG/10ML IV SOLN
INTRAVENOUS | Status: DC | PRN
  Administered 2014-08-15: 5 mg via INTRAVENOUS

## 2014-08-15 MED ORDER — PROPOFOL 10 MG/ML IV BOLUS
INTRAVENOUS | Status: DC | PRN
  Administered 2014-08-15: 180 mg via INTRAVENOUS

## 2014-08-15 MED ORDER — ROCURONIUM BROMIDE 100 MG/10ML IV SOLN
INTRAVENOUS | Status: DC | PRN
  Administered 2014-08-15: 30 mg via INTRAVENOUS
  Administered 2014-08-15 (×2): 10 mg via INTRAVENOUS

## 2014-08-15 MED ORDER — GLYCOPYRROLATE 0.2 MG/ML IJ SOLN
INTRAMUSCULAR | Status: DC | PRN
  Administered 2014-08-15: .8 mg via INTRAVENOUS

## 2014-08-15 MED ORDER — IOHEXOL 300 MG/ML  SOLN
150.0000 mL | Freq: Once | INTRAMUSCULAR | Status: AC | PRN
  Administered 2014-08-15: 70 mL via INTRAVENOUS

## 2014-08-15 MED ORDER — MIDAZOLAM HCL 2 MG/2ML IJ SOLN
1.0000 mg | Freq: Once | INTRAMUSCULAR | Status: AC
  Administered 2014-08-15: 1 mg via INTRAVENOUS

## 2014-08-15 MED ORDER — LIDOCAINE HCL (CARDIAC) 20 MG/ML IV SOLN
INTRAVENOUS | Status: DC | PRN
  Administered 2014-08-15: 80 mg via INTRAVENOUS

## 2014-08-15 MED ORDER — FENTANYL CITRATE (PF) 100 MCG/2ML IJ SOLN
INTRAMUSCULAR | Status: AC
  Filled 2014-08-15: qty 2

## 2014-08-15 MED ORDER — PROMETHAZINE HCL 25 MG/ML IJ SOLN: 6.2500 mg | INTRAMUSCULAR | Status: DC | PRN

## 2014-08-15 MED ORDER — HYDROMORPHONE HCL 1 MG/ML IJ SOLN
1.0000 mg | INTRAMUSCULAR | Status: DC | PRN
  Administered 2014-08-15: 0.5 mg via INTRAVENOUS

## 2014-08-15 MED ORDER — LACTATED RINGERS IV SOLN
INTRAVENOUS | Status: DC | PRN
  Administered 2014-08-15 (×2): via INTRAVENOUS

## 2014-08-15 MED ORDER — VANCOMYCIN HCL 10 G IV SOLR
1500.0000 mg | Freq: Once | INTRAVENOUS | Status: AC
  Administered 2014-08-15: 1500 mg via INTRAVENOUS
  Filled 2014-08-15 (×2): qty 1500

## 2014-08-15 MED ORDER — SODIUM CHLORIDE 0.9 % IV SOLN: Freq: Once | INTRAVENOUS | Status: DC

## 2014-08-15 MED ORDER — SUCCINYLCHOLINE CHLORIDE 20 MG/ML IJ SOLN
INTRAMUSCULAR | Status: DC | PRN
  Administered 2014-08-15: 100 mg via INTRAVENOUS

## 2014-08-15 MED ORDER — FENTANYL CITRATE (PF) 100 MCG/2ML IJ SOLN
25.0000 ug | INTRAMUSCULAR | Status: DC | PRN
  Administered 2014-08-15 (×3): 50 ug via INTRAVENOUS

## 2014-08-15 MED ORDER — OXYCODONE HCL 5 MG/5ML PO SOLN: 5.0000 mg | Freq: Once | ORAL | Status: DC | PRN

## 2014-08-15 MED ORDER — EPHEDRINE SULFATE 50 MG/ML IJ SOLN
INTRAMUSCULAR | Status: DC | PRN
  Administered 2014-08-15: 10 mg via INTRAVENOUS

## 2014-08-15 MED ORDER — CETYLPYRIDINIUM CHLORIDE 0.05 % MT LIQD
7.0000 mL | Freq: Two times a day (BID) | OROMUCOSAL | Status: DC
  Administered 2014-08-15 – 2014-09-02 (×37): 7 mL via OROMUCOSAL

## 2014-08-15 MED ORDER — VITAMIN K1 10 MG/ML IJ SOLN
10.0000 mg | Freq: Once | INTRAVENOUS | Status: AC
  Administered 2014-08-15: 10 mg via INTRAVENOUS
  Filled 2014-08-15: qty 1

## 2014-08-15 MED ORDER — MIDAZOLAM HCL 2 MG/2ML IJ SOLN
INTRAMUSCULAR | Status: AC
  Filled 2014-08-15: qty 2

## 2014-08-15 MED ORDER — OCTREOTIDE ACETATE 500 MCG/ML IJ SOLN
500.0000 ug | INTRAMUSCULAR | Status: DC | PRN
  Administered 2014-08-15: 25 ug/h via INTRAVENOUS

## 2014-08-15 NOTE — Progress Notes (Signed)
UR Completed.  [phone]  

## 2014-08-15 NOTE — Progress Notes (Signed)
Orthopedic Tech Progress Note Patient Details:  John [name] December 15, 1955 [id number] Delivered Velcro knee immobilizer to pt.'s nurse. Patient ID: John [name], male   DOB: July 16, 1955, 59 y.o.   MRN: [id number]   John [name] 08/15/2014, 9:06 PM

## 2014-08-15 NOTE — Sedation Documentation (Signed)
See anesthesia notes for details.  ICU bed requested post procedure per DR [facility].  Spoke with Robyn in bed cotntrol.

## 2014-08-15 NOTE — Anesthesia Postprocedure Evaluation (Signed)
  Anesthesia Post-op Note  Patient: John [name]  Procedure(s) Performed: Procedure(s): RADIOLOGY WITH ANESTHESIA (N/A)  Patient Location: PACU  Anesthesia Type:General  Level of Consciousness: awake, alert  and oriented  Airway and Oxygen Therapy: Patient Spontanous Breathing and Patient connected to nasal cannula oxygen  Post-op Pain: mild  Post-op Assessment: Post-op Vital signs reviewed, Patient's Cardiovascular Status Stable, Respiratory Function Stable, Patent Airway, No signs of Nausea or vomiting and Pain level controlled  Post-op Vital Signs: stable  Last Vitals:  Filed Vitals:   08/15/14 1836  BP:   Pulse: 79  Temp:   Resp: 22    Complications: No apparent anesthesia complications

## 2014-08-15 NOTE — Procedures (Signed)
Successful BRTO of the gastric varices with STS sclerotherapy and coils No immed comp Stable Full report in PACS

## 2014-08-15 NOTE — Plan of Care (Signed)
Problem: Phase I Progression Outcomes Goal: Hemodynamically stable Outcome: Progressing Currently hemodynamically stable

## 2014-08-15 NOTE — Progress Notes (Signed)
[facility] TEAM 1 - Stepdown/ICU TEAM Progress Note  Kenyatte Chatmon SWF:[id number] DOB: [birth year] DOA: 08/14/2014 PCP: No PCP Per Patient  Admit HPI / Brief Narrative: 59 y.o. male with a history of alcoholism, cirrhosis, coagulopathy, and polysubstance abuse who was discharged from the Hospitalist service on 07/28/2014 having undergone treatment for new onset ascites via paracentesis with removal of 2 L of fluid. He returned to the ED 4/17 with complaints of hematemesis over 3 days as well as bright red blood per rectum. He had several episodes of bloody/maroon liquid stools in the ED. He had a hemoglobin of 5.4 and platelet count of 98,000. INR 1.61 with PT of 19.3. He received his first unit of packed red blood cells while in the emergency department. Blood pressure was 125/74 with a pulse of 80, satting 100% on 2 L. Patient complained of abdominal pain located in the epigastric region. He takes Aleve as well as aspirin on a daily basis, and reported he had only had one beer since his last hospitalization.    HPI/Subjective: The patient is resting comfortably in bed.  He denies abdominal pain chest pain shortness of breath or fever/chills.  His only complaint is that he is not being allowed to eat or drink.  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 Noted via EGD - plan is for balloon-occluded transvenous obliteration per IR +/- TIPS - is hemodynamically stable at present  Acute blood loss anemia Hemoglobin has improved status post 4 units of packed red blood cells - continue to follow trend in a serial fashion  Alcoholic cirrhosis with ascites Counseled patient as to the absolute need to discontinue all forms of alcohol immediately and completely and as to the likelihood of his early death should he choose to continue to drink  Coagulopathy Due to cirrhosis -  repeat vitamin K dose IV - recheck INR pending  Acute kidney injury  Most consistent with prerenal azotemia - transfusion should help - presently creatinine still climbing - follow trend  Transaminitis  LFTs elevated in a pattern concerning for ongoing alcohol use - follow trend  HTN Blood pressure currently controlled  Obesity - Body mass index is 36.22 kg/(m^2).  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 rate 19, height 5\' 10"  (1.778 m), weight 114.5 kg (252 lb 6.8 oz), SpO2 92 %.  Intake/Output Summary (Last 24 hours) at 08/15/14 1044 Last data filed at 08/15/14 1000  Gross per 24 hour  Intake 5365.33 ml  Output    700 ml  Net 4665.33 ml   Exam: General: No acute respiratory distress Lungs: Clear to auscultation bilaterally without wheezes or crackles Cardiovascular: Regular rate and rhythm without murmur gallop or rub normal S1 and S2 Abdomen: Nontender, nondistended, soft, bowel sounds positive, no rebound, no ascites, no appreciable mass Extremities: No significant cyanosis, clubbing, or edema bilateral lower extremities  Data Reviewed: Basic Metabolic Panel:  Recent Labs Lab 08/14/14 1233 08/15/14 0811  NA 145 144  K 5.6* 4.5  CL 116* 114*  CO2 23 21  GLUCOSE 101* 131*  BUN 46* 48*  CREATININE 2.01* 2.51*  CALCIUM 8.0* 8.0*    Liver Function  Tests:  Recent Labs Lab 08/14/14 1233  AST 114*  ALT 45  ALKPHOS 51  BILITOT 1.6*  PROT 5.4*  ALBUMIN 1.9*   Coags:  Recent Labs Lab 08/14/14 1150 08/14/14 1233  INR NOT CALCULATED 1.61*   CBC:  Recent Labs Lab 08/14/14 1233 08/15/14 0811  WBC 10.1 8.7    NEUTROABS 6.2  --   HGB 5.4* 8.2*  HCT 16.0* 23.1*  MCV 86.5 83.1  PLT 98* 72*   CBG:  Recent Labs Lab 08/15/14 0003  GLUCAP 113*    Recent Results (from the past 240 hour(s))  MRSA PCR Screening     Status: None   Collection Time: 08/15/14 12:24 AM  Result Value Ref Range Status   MRSA by PCR NEGATIVE NEGATIVE Final    Comment:        The GeneXpert MRSA Assay (FDA approved for NASAL specimens only), is one component of a comprehensive MRSA colonization surveillance program. It is not intended to diagnose MRSA infection nor to guide or monitor treatment for MRSA infections.      Studies:   Recent x-ray studies have been reviewed in detail by the Attending Physician  Scheduled Meds:  Scheduled Meds: . antiseptic oral rinse  7 mL Mouth Rinse BID  . ciprofloxacin  400 mg Intravenous Q12H  . [START ON 08/17/2014] pantoprazole (PROTONIX) IV  40 mg Intravenous Q12H  . sodium chloride  3 mL Intravenous Q12H    Time spent on care of this patient: 35 mins   MCCLUNG,JEFFREY T , MD   [facility] Office  [phone] Pager - Text Page per Shea Evans as per below:  On-Call/Text Page:      [email or web address]      password [email or web address]  If 7PM-7AM, please contact night-coverage [email or web address] Password [email or web address] 08/15/2014, 10:44 AM   LOS: 1 day

## 2014-08-15 NOTE — Anesthesia Preprocedure Evaluation (Addendum)
Anesthesia Evaluation  Patient identified by MRN, date of birth, ID band Patient awake and Patient confused    Reviewed: Allergy & Precautions, NPO status , Patient's Chart, lab work & pertinent test results  Airway Mallampati: III   Neck ROM: Full    Dental  (+) Poor Dentition, Loose   Pulmonary shortness of breath, former smoker,  + rhonchi   + decreased breath sounds      Cardiovascular hypertension, +CHF Rhythm:Regular Rate:Normal     Neuro/Psych CVA    GI/Hepatic (+) Cirrhosis -    substance abuse  , Liver disease and cirrhosisi   Endo/Other  Morbid obesity  Renal/GU Renal InsufficiencyRenal disease     Musculoskeletal   Abdominal (+) + obese,   Peds  Hematology   Anesthesia Other Findings   Reproductive/Obstetrics                            Anesthesia Physical Anesthesia Plan  ASA: IV  Anesthesia Plan: General   Post-op Pain Management:    Induction: Intravenous  Airway Management Planned: Oral ETT  Additional Equipment: Arterial line  Intra-op Plan:   Post-operative Plan: Possible Post-op intubation/ventilation  Informed Consent: I have reviewed the patients History and Physical, chart, labs and discussed the procedure including the risks, benefits and alternatives for the proposed anesthesia with the patient or authorized representative who has indicated his/her understanding and acceptance.   Dental advisory given  Plan Discussed with: CRNA and Surgeon  Anesthesia Plan Comments:         Anesthesia Quick Evaluation

## 2014-08-15 NOTE — Progress Notes (Signed)
     [facility] Progress Note  Subjective:   S/P egd that showed large gastric cardia ulcer. Rec 3 units prbcs yesterday, hgb today 8.2.Pt was seen by IR yesterday--"Patient is a candidate for BRTO to address the bleeding gastric varices, with consideration for additional tips. Factors which complicate a TIPS placement would be MELD > 20 (poor post-tips prognosis over MELD = 18), history of CHF, and record of baseline encephalopathy." Order has been placed. No further hematemesis. Nurses report pt had a dark BM last pm.   Objective:  Vital signs in last 24 hours: Temp:  [97.9 F (36.6 C)-100.1 F (37.8 C)] 97.9 F (36.6 C) (04/18 0818) Pulse Rate:  [70-84] 77 (04/18 0800) Resp:  [14-34] 17 (04/18 0800) BP: (106-151)/(46-104) 145/75 mmHg (04/18 0800) SpO2:  [95 %-100 %] 99 % (04/18 0800) Weight:  [252 lb 6.8 oz (114.5 kg)] 252 lb 6.8 oz (114.5 kg) (04/18 0000) Last BM Date: 08/15/14 General:   Alert,ill appearing  in NAD Heart:  Regular rate and rhythm; no murmurs Pulm;lungs clear Abdomen:  Soft, mild diffuse tenderness. Normal bowel sounds, without guarding, and without rebound.   Extremities:  Without edema. Neurologic:Alert and  oriented x3;  grossly normal neurologically. Psych: Alert and cooperative. Normal mood and affect.  Intake/Output from previous day: 04/17 0701 - 04/18 0700 In: 2905.3 [I.V.:1397.3; Blood:1208; IV Piggyback:300] Out: 700 [Urine:700] Intake/Output this shift: Total I/O In: 360 [I.V.:360] Out: -   Lab Results:  Recent Labs  08/14/14 1233 08/15/14 0811  WBC 10.1 8.7  HGB 5.4* 8.2*  HCT 16.0* 23.1*  PLT 98* 72*   BMET  Recent Labs  08/14/14 1233 08/15/14 0811  NA 145 144  K 5.6* 4.5  CL 116* 114*  CO2 23 21  GLUCOSE 101* 131*  BUN 46* 48*  CREATININE 2.01* 2.51*  CALCIUM 8.0* 8.0*   LFT  Recent Labs  08/14/14 1233  PROT 5.4*  ALBUMIN 1.9*  AST 114*  ALT 45  ALKPHOS 51  BILITOT 1.6*   PT/INR  Recent  Labs  08/14/14 1150 08/14/14 1233  LABPROT SPECIMEN CONTAMINATED, UNABLE TO PERFORM TEST(S). 19.3*  INR NOT CALCULATED 1.61*     ENDOSCOPY 08/14/14:ENDOSCOPIC IMPRESSION: 1. 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 The EGD otherwise appeared normal RECOMMENDATIONS: 1. PPI gtt 2. Octreotide gtt 3. IR consult called for possible TIPS and/or BRTO eSigned: Ladene Artist, MD, [facility] 08/14/2014 4:36 PM  ASSESSMENT/PLAN:  59 yo male with coagulopathy, cirrhosis,m alcoholism, polysubstance abuse admitted in March for new onset ascites, s/p paracentesis with removal of 2L fluid. Pt was discharged 3/31 and presented to ER 4/17 with complaints of BRBPR and 3 days of hematemesis. Hgb on adm 5.4. Had EGD yesterday with large gastric cardia ulcer.Has rec 3 units prbcs.Has been seen by IR and plan is for BRTO to address bleeding varices. Order has been placed earlier today.      LOS: 1 day   Christofer Shen, Deloris Ping 08/15/2014, Pager [phone]

## 2014-08-15 NOTE — Anesthesia Procedure Notes (Addendum)
Procedure Name: Intubation Date/Time: 08/15/2014 3:23 PM Performed by: Ollen Bowl Pre-anesthesia Checklist: Patient identified, Emergency Drugs available, Suction available, Patient being monitored and Timeout performed Patient Re-evaluated:Patient Re-evaluated prior to inductionOxygen Delivery Method: Circle system utilized and Simple face mask Preoxygenation: Pre-oxygenation with 100% oxygen Intubation Type: IV induction Ventilation: Mask ventilation without difficulty Laryngoscope Size: Mac and 4 Grade View: Grade I Tube type: Subglottic suction tube Tube size: 8.0 mm Number of attempts: 1 Airway Equipment and Method: Patient positioned with wedge pillow and Stylet Placement Confirmation: ETT inserted through vocal cords under direct vision,  positive ETCO2 and breath sounds checked- equal and bilateral Secured at: 22 cm Tube secured with: Tape Dental Injury: Teeth and Oropharynx as per pre-operative assessment

## 2014-08-15 NOTE — Consult Note (Signed)
Chief Complaint: Chief Complaint  Patient presents with  . Rectal Bleeding  . Hematemesis  Bleeding gastric varices  Referring Physician(s): TRH  History of Present Illness: John [name] is a 59 y.o. male   Known polysubstance abuse; alcohol abuse; HCV Presented to ED with hematochezia and hematemesis 08/14/14 Previous admission with CT 3/29 does reveal portal hypertension; cirrhosis; ascites; gastric varices; gastro-renal porto systemic shunt Request per [facility] for consideration of BRTO Balloon occluded retrograde transvenous obliteration Dr Earleen Newport reviewed all imaging and spoke to pt and family at length regarding procedure I have seen and examined pt Spoke to pt then to niece Thor Nannini via phone Tentatively scheduled for BRTO 4/19 in IR MELD 22 today (not candidate for TIPS at this time)  Past Medical History  Diagnosis Date  . Hypertension   . Chronic back pain   . Stroke 1998  . Polysubstance abuse   . Optic neuropathy, left   . CHF (congestive heart failure)   . Ascites   . ETOH abuse     Past Surgical History  Procedure Laterality Date  . None    . Esophagogastroduodenoscopy N/A 08/14/2014    Procedure: ESOPHAGOGASTRODUODENOSCOPY (EGD);  Surgeon: Ladene Artist, MD;  Location: [facility];  Service: Endoscopy;  Laterality: N/A;    Allergies: Penicillins  Medications: Prior to Admission medications   Medication Sig Start Date End Date Taking? Authorizing Provider  amLODipine (NORVASC) 10 MG tablet Take 1 tablet (10 mg total) by mouth daily. 06/27/14   Ripudeep Krystal Eaton, MD  aspirin EC 325 MG EC tablet Take 1 tablet (325 mg total) by mouth daily. 06/27/14   Ripudeep Krystal Eaton, MD  furosemide (LASIX) 20 MG tablet Take 1 tablet (20 mg total) by mouth daily. 07/28/14   Monica Becton, MD  metoprolol tartrate (LOPRESSOR) 25 MG tablet Take 2 tablets (50 mg total) by mouth 2 (two) times daily. 07/28/14   Monica Becton, MD  Naproxen Sodium (ALEVE PO)  Take by mouth.    Historical Provider, MD  spironolactone (ALDACTONE) 50 MG tablet Take 1 tablet (50 mg total) by mouth daily. 07/28/14   Monica Becton, MD  traMADol (ULTRAM) 50 MG tablet Take 1 tablet (50 mg total) by mouth every 12 (twelve) hours as needed for moderate pain or severe pain. 06/27/14   Ripudeep Krystal Eaton, MD  UNABLE TO FIND Outpatient Physical and occupational therapy   Diagnosis: left sided vision loss, optic neuritis 06/29/14   Ripudeep Krystal Eaton, MD     Family History  Problem Relation Age of Onset  . Hypertension Mother     Living  . Hypertension Brother   . Kidney disease Mother   . Kidney disease Brother   . Hypertension Father   . Hypertension Father     Deceased, 79  . Hypertension Sister   . Ulcers Father     History   Social History  . Marital Status: Single    Spouse Name: N/A  . Number of Children: N/A  . Years of Education: N/A   Social History Main Topics  . Smoking status: Former Smoker -- 0.30 packs/day for 20 years    Types: Cigarettes  . Smokeless tobacco: Current User    Types: Snuff     Comment: 2015  . Alcohol Use: 0.0 oz/week    0 Standard drinks or equivalent per week     Comment: occ  . Drug Use: Yes    Special: Cocaine, Marijuana     Comment: since  fall 2015  . Sexual Activity: Not on file   Other Topics Concern  . None   Social History Narrative   Lives with niece in a 2 story home.     On disability since 2015 for low back pain.  Used to work Statistician.    Highest level of education: 11th grade    Review of Systems: A 12 point ROS discussed and pertinent positives are indicated in the HPI above.  All other systems are negative.  Review of Systems  Constitutional: Positive for activity change, appetite change and fatigue. Negative for fever and diaphoresis.  HENT: Negative for nosebleeds and trouble swallowing.   Respiratory: Negative for cough and shortness of breath.   Cardiovascular: Negative for chest pain.    Gastrointestinal: Positive for nausea, abdominal pain and blood in stool.  Genitourinary: Negative for difficulty urinating.  Neurological: Positive for weakness.  Psychiatric/Behavioral: Negative for behavioral problems and confusion.       Mild confusion---very sleepy     Vital Signs: BP 130/70 mmHg  Pulse 77  Temp(Src) 97.9 F (36.6 C) (Oral)  Resp 19  Ht 5\' 10"  (1.778 m)  Wt 114.5 kg (252 lb 6.8 oz)  BMI 36.22 kg/m2  SpO2 92%  Physical Exam  Constitutional: He is oriented to person, place, and time. He appears well-nourished.  Cardiovascular: Normal rate, regular rhythm and normal heart sounds.   No murmur heard. Pulmonary/Chest: Effort normal. He has wheezes.  Abdominal: Soft. Bowel sounds are normal. He exhibits distension. There is no tenderness.  Musculoskeletal: Normal range of motion.  Neurological: He is alert and oriented to person, place, and time.  groggy  Skin: Skin is warm and dry.  Psychiatric: He has a normal mood and affect. His behavior is normal. Judgment and thought content normal.  Answers all questions appropriately Very groggy I did speak also to niece Shamikka via phone. Pt and niece consent to procedure  Nursing note and vitals reviewed.   Mallampati Score:  MD Evaluation Airway: WNL Heart: WNL Abdomen: WNL Chest/ Lungs: WNL ASA  Classification: 3 Mallampati/Airway Score: Three  Imaging: Mr Lumbar Spine Wo Contrast  07/29/2014   CLINICAL DATA:  Low back pain, worse on the left than the right. Weakness and numbness in the left leg, approximately 1 year duration. Began after lifting heavy object.  EXAM: MRI LUMBAR SPINE WITHOUT CONTRAST  TECHNIQUE: Multiplanar, multisequence MR imaging of the lumbar spine was performed. No intravenous contrast was administered.  COMPARISON:  CT abdomen 07/26/2014  FINDINGS: T12-L1:  Normal.  Conus tip at this level.  L1-2: Retrolisthesis of 2 mm. Mild bulging of the disc. No compressive stenosis.  L2-3:  Retrolisthesis of 2 mm. Mild bulging of the disc. No stenosis.  L3-4:  Mild bulging of the disc.  No compressive stenosis.  L4-5: Endplate osteophytes and shallow protrusion of disc material. Mild facet and ligamentous hypertrophy. Mild stenosis of both lateral recesses without definite neural compression.  L5-S1: Bilateral facet arthropathy with 5 mm of anterolisthesis. Disc degeneration with circumferential bulging of the disc. The central canal sufficiently patent. Foraminal narrowing bilaterally that could compress either or both L5 nerve roots, more likely the left.  IMPRESSION: Non-compressive degenerative changes at L1-2, L2-3 and L3-4.  L4-5: Endplate osteophytes and shallow protrusion of disc material. Mild facet hypertrophy. Mild stenosis of both lateral recesses without definite neural compression.  L5-S1: Bilateral facet arthropathy with 5 mm anterolisthesis. Disc degeneration and bulging of the disc. Foraminal stenosis bilaterally, left more than  right. Either L5 nerve root could be compressed, more likely the left.   Electronically Signed   By: Nelson Chimes M.D.   On: 07/29/2014 20:18   Ct Abdomen Pelvis W Contrast  07/26/2014   CLINICAL DATA:  Diffuse abdominal pain for 2 days  EXAM: CT ABDOMEN AND PELVIS WITH CONTRAST  TECHNIQUE: Multidetector CT imaging of the abdomen and pelvis was performed using the standard protocol following bolus administration of intravenous contrast.  CONTRAST:  178mL OMNIPAQUE IOHEXOL 300 MG/ML  SOLN  COMPARISON:  None.  FINDINGS: Lung bases are unremarkable.  Small hiatal hernia.  Sagittal images of the spine shows degenerative changes thoracolumbar spine. Significant disc space flattening with vacuum disc phenomenon at L4-L5 and L5-S1 level.  There is perihepatic and perisplenic ascites. Small ascites is noted in left paracolic gutter. Moderate ascites is noted in right paracolic gutter. Markedly nodular contour of the liver and mild hepatic atrophy consistent with  cirrhosis. No focal hepatic mass. No calcified gallstones are noted within gallbladder. The spleen measures 12.5 cm in length. There are varices in proximal perigastric region. Portal vein measures 1.4 cm in diameter. Findings are suspicious for portal hypertension. There is small umbilical hernia containing fat and small bowel lobe without evidence of acute complication. Subcutaneous edema is noted in umbilical and periumbilical region. Minimal subcutaneous edema bilateral flank wall  Oral contrast material was given to the patient. No small bowel obstruction. No ascites or free air. No adenopathy. The pancreas and adrenal glands are unremarkable. Kidneys are symmetrical in size and enhancement. No hydronephrosis or hydroureter. Mild atherosclerotic calcifications of abdominal aorta and iliac arteries. No aortic aneurysm.  Moderate pelvic ascites. Urinary bladder is unremarkable. Prostate gland and seminal vesicles are unremarkable. Small nonspecific bilateral inguinal lymph nodes. No destructive bony lesions are noted within pelvis.  Delayed renal images shows mild delay excretion bilaterally.  IMPRESSION: 1. There is moderate perihepatic and perisplenic ascites. Mild hepatic atrophy with nodular contour of the liver highly suspicious for cirrhosis. Small varices are noted in perigastric and GE junction region. Portal vein measures 1.4 cm in diameter suspicious for portal hypertension. 2. Borderline splenomegaly with spleen measuring 12.5 cm in length. 3. No hydronephrosis or hydroureter. Mild delayed excretion bilateral kidneys. 4. Moderate ascites is noted in right paracolic gutter. Moderate pelvic ascites. 5. Small umbilical hernia containing fat and small bowel loop without evidence of acute complication. No small bowel obstruction. Mild subcutaneous edema umbilical and periumbilical region.   Electronically Signed   By: Lahoma Crocker M.D.   On: 07/26/2014 22:16   US Paracentesis  07/28/2014   INDICATION:  Ascites  EXAM: ULTRASOUND-GUIDED PARACENTESIS  COMPARISON:  None.  MEDICATIONS: 10 cc 1% lidocaine  COMPLICATIONS: None immediate  TECHNIQUE: Informed written consent was obtained from the patient after a discussion of the risks, benefits and alternatives to treatment. A timeout was performed prior to the initiation of the procedure.  Initial ultrasound scanning demonstrates a large amount of ascites within the right upper abdominal quadrant. The right upper abdomen was prepped and draped in the usual sterile fashion. 1% lidocaine with epinephrine was used for local anesthesia. Under direct ultrasound guidance, a 19 gauge, 7-cm, Yueh catheter was introduced. An ultrasound image was saved for documentation purposed. the paracentesis was performed. The catheter was removed and a dressing was applied. The patient tolerated the procedure well without immediate post procedural complication.  FINDINGS: A total of approximately 1.9 liters of cloudy yellow fluid was removed. Samples were sent to the  laboratory as requested by the clinical team.  IMPRESSION: Successful ultrasound-guided paracentesis yielding 1.9 liters of peritoneal fluid.  Read by:  Lavonia Drafts [facility]   Electronically Signed   By: Markus Daft M.D.   On: 07/27/2014 12:22    Labs:  CBC:  Recent Labs  07/26/14 1633 07/27/14 0443 08/14/14 1233 08/15/14 0811  WBC 4.9 5.6 10.1 8.7  HGB 12.0* 10.4* 5.4* 8.2*  HCT 34.7* 30.1* 16.0* 23.1*  PLT 129* 124* 98* 72*    COAGS:  Recent Labs  06/23/14 2214 07/26/14 1948 07/27/14 0443 08/14/14 1150 08/14/14 1233  INR 1.13 1.32 1.42 NOT CALCULATED 1.61*  APTT 33  --   --   --   --     BMP:  Recent Labs  07/26/14 1633 07/27/14 0443 08/14/14 1233 08/15/14 0811  NA 134* 134* 145 144  K 4.0 4.0 5.6* 4.5  CL 102 104 116* 114*  CO2 23 26 23 21   GLUCOSE 89 72 101* 131*  BUN 6 5* 46* 48*  CALCIUM 8.9 8.2* 8.0* 8.0*  CREATININE 1.54* 1.49* 2.01* 2.51*  GFRNONAA 48* 50* 35* 27*  GFRAA  56* 58* 41* 31*    LIVER FUNCTION TESTS:  Recent Labs  06/26/14 0054 07/26/14 1633 07/27/14 0443 08/14/14 1233  BILITOT 1.5* 1.8* 1.8* 1.6*  AST 108* 94* 79* 114*  ALT 68* 47 39 45  ALKPHOS 106 150* 117 51  PROT 7.4 7.9 6.9 5.4*  ALBUMIN 2.5* 2.7* 2.3* 1.9*    TUMOR MARKERS: No results for input(s): AFPTM, CEA, CA199, CHROMGRNA in the last 8760 hours.  Assessment and Plan:  Pt with ETOH abuse; polysubs abuse; HCV; Cirrhosis Admitted from ED 4/17 with hematochezia and hematemesis Endo and CT reveal gastric varices; portal HTN; gastro-renal porto systemic shunt Dr Earleen Newport has seen and discussed with pt the procedure of BRTO Now tentatively scheduled for same 4/19 am MELD 22---not candidate for TIPS at this time. Balloon occluded retrograde transvenous obliteration of gastric varices has been discussed with pt and family They have good understanding of benefits and risks including but not limited to Infection; vessel damage; damage to surrounding structures; bleeding; death Agreeable to proceed Consent signed andin chart  Plt 72; INR 1.6 4/18   Thank you for this interesting consult.  I greatly enjoyed meeting Sabin Gibeault and look forward to participating in their care.  Signed: Lundon Verdejo A 08/15/2014, 11:16 AM   I spent a total of 40 Minutes  in face to face in clinical consultation, greater than 50% of which was counseling/coordinating care for BRTO

## 2014-08-15 NOTE — Transfer of Care (Signed)
Immediate Anesthesia Transfer of Care Note  Patient: John [name]  Procedure(s) Performed: Procedure(s): RADIOLOGY WITH ANESTHESIA (N/A)  Patient Location: PACU  Anesthesia Type:General  Level of Consciousness: awake and alert   Airway & Oxygen Therapy: Patient Spontanous Breathing and Patient connected to nasal cannula oxygen  Post-op Assessment: Report given to RN, Post -op Vital signs reviewed and stable and Patient moving all extremities X 4  Post vital signs: Reviewed and stable  Last Vitals:  Filed Vitals:   08/15/14 1218  BP:   Pulse:   Temp: 36.8 C  Resp:     Complications: No apparent anesthesia complications

## 2014-08-16 ENCOUNTER — Inpatient Hospital Stay (HOSPITAL_COMMUNITY): Payer: Medicaid Other

## 2014-08-16 DIAGNOSIS — I10 Essential (primary) hypertension: Secondary | ICD-10-CM | POA: Diagnosis present

## 2014-08-16 DIAGNOSIS — N179 Acute kidney failure, unspecified: Secondary | ICD-10-CM | POA: Diagnosis present

## 2014-08-16 DIAGNOSIS — R740 Nonspecific elevation of levels of transaminase and lactic acid dehydrogenase [LDH]: Secondary | ICD-10-CM

## 2014-08-16 DIAGNOSIS — N189 Chronic kidney disease, unspecified: Secondary | ICD-10-CM

## 2014-08-16 DIAGNOSIS — I864 Gastric varices: Secondary | ICD-10-CM | POA: Diagnosis present

## 2014-08-16 DIAGNOSIS — R7401 Elevation of levels of liver transaminase levels: Secondary | ICD-10-CM | POA: Diagnosis present

## 2014-08-16 DIAGNOSIS — D696 Thrombocytopenia, unspecified: Secondary | ICD-10-CM

## 2014-08-16 LAB — COMPREHENSIVE METABOLIC PANEL
ALK PHOS: 50 U/L (ref 39–117)
ALT: 75 U/L — ABNORMAL HIGH (ref 0–53)
AST: 200 U/L — ABNORMAL HIGH (ref 0–37)
Albumin: 1.9 g/dL — ABNORMAL LOW (ref 3.5–5.2)
Anion gap: 7 (ref 5–15)
BILIRUBIN TOTAL: 3.2 mg/dL — AB (ref 0.3–1.2)
BUN: 38 mg/dL — AB (ref 6–23)
CHLORIDE: 119 mmol/L — AB (ref 96–112)
CO2: 21 mmol/L (ref 19–32)
Calcium: 8 mg/dL — ABNORMAL LOW (ref 8.4–10.5)
Creatinine, Ser: 2.25 mg/dL — ABNORMAL HIGH (ref 0.50–1.35)
GFR calc non Af Amer: 31 mL/min — ABNORMAL LOW (ref >90)
GFR, EST AFRICAN AMERICAN: 35 mL/min — AB (ref >90)
GLUCOSE: 138 mg/dL — AB (ref 70–99)
POTASSIUM: 4 mmol/L (ref 3.5–5.1)
SODIUM: 147 mmol/L — AB (ref 135–145)
Total Protein: 5.4 g/dL — ABNORMAL LOW (ref 6.0–8.3)

## 2014-08-16 LAB — CBC WITH DIFFERENTIAL/PLATELET
Basophils Absolute: 0 10*3/uL (ref 0.0–0.1)
Basophils Relative: 0 % (ref 0–1)
EOS ABS: 0 10*3/uL (ref 0.0–0.7)
Eosinophils Relative: 0 % (ref 0–5)
HCT: 23.7 % — ABNORMAL LOW (ref 39.0–52.0)
Hemoglobin: 8.1 g/dL — ABNORMAL LOW (ref 13.0–17.0)
Lymphocytes Relative: 13 % (ref 12–46)
Lymphs Abs: 1 10*3/uL (ref 0.7–4.0)
MCH: 29 pg (ref 26.0–34.0)
MCHC: 34.2 g/dL (ref 30.0–36.0)
MCV: 84.9 fL (ref 78.0–100.0)
Monocytes Absolute: 0.8 10*3/uL (ref 0.1–1.0)
Monocytes Relative: 10 % (ref 3–12)
NEUTROS ABS: 5.9 10*3/uL (ref 1.7–7.7)
NEUTROS PCT: 77 % (ref 43–77)
PLATELETS: 62 10*3/uL — AB (ref 150–400)
RBC: 2.79 MIL/uL — ABNORMAL LOW (ref 4.22–5.81)
RDW: 17.6 % — AB (ref 11.5–15.5)
WBC: 7.7 10*3/uL (ref 4.0–10.5)

## 2014-08-16 LAB — PROTIME-INR
INR: 1.45 (ref 0.00–1.49)
Prothrombin Time: 17.8 seconds — ABNORMAL HIGH (ref 11.6–15.2)

## 2014-08-16 LAB — APTT: aPTT: 28 seconds (ref 24–37)

## 2014-08-16 MED ORDER — LORAZEPAM 2 MG/ML IJ SOLN: 1.0000 mg | Freq: Four times a day (QID) | INTRAMUSCULAR | Status: AC | PRN

## 2014-08-16 MED ORDER — ADULT MULTIVITAMIN W/MINERALS CH
1.0000 | ORAL_TABLET | Freq: Every day | ORAL | Status: DC
  Administered 2014-08-17 – 2014-08-26 (×9): 1 via ORAL
  Filled 2014-08-16 (×10): qty 1

## 2014-08-16 MED ORDER — FOLIC ACID 1 MG PO TABS
1.0000 mg | ORAL_TABLET | Freq: Every day | ORAL | Status: DC
  Administered 2014-08-17 – 2014-08-26 (×9): 1 mg via ORAL
  Filled 2014-08-16 (×11): qty 1

## 2014-08-16 MED ORDER — THIAMINE HCL 100 MG/ML IJ SOLN
100.0000 mg | Freq: Every day | INTRAMUSCULAR | Status: DC
  Administered 2014-08-19: 100 mg via INTRAVENOUS
  Filled 2014-08-16 (×6): qty 1

## 2014-08-16 MED ORDER — VITAMIN B-1 100 MG PO TABS
100.0000 mg | ORAL_TABLET | Freq: Every day | ORAL | Status: DC
  Administered 2014-08-17 – 2014-08-26 (×9): 100 mg via ORAL
  Filled 2014-08-16 (×10): qty 1

## 2014-08-16 MED ORDER — LORAZEPAM 0.5 MG PO TABS: 1.0000 mg | ORAL_TABLET | Freq: Four times a day (QID) | ORAL | Status: AC | PRN

## 2014-08-16 MED ORDER — LORAZEPAM 2 MG/ML IJ SOLN
0.0000 mg | Freq: Two times a day (BID) | INTRAMUSCULAR | Status: AC
  Administered 2014-08-18: 2 mg via INTRAVENOUS
  Filled 2014-08-16: qty 1

## 2014-08-16 MED ORDER — SODIUM CHLORIDE 0.9 % IJ SOLN
10.0000 mL | Freq: Two times a day (BID) | INTRAMUSCULAR | Status: DC
  Administered 2014-08-17 – 2014-08-22 (×10): 10 mL

## 2014-08-16 MED ORDER — LORAZEPAM 2 MG/ML IJ SOLN
0.0000 mg | Freq: Four times a day (QID) | INTRAMUSCULAR | Status: AC
  Administered 2014-08-16 – 2014-08-17 (×3): 2 mg via INTRAVENOUS
  Administered 2014-08-17: 3 mg via INTRAVENOUS
  Administered 2014-08-17 – 2014-08-18 (×2): 2 mg via INTRAVENOUS
  Administered 2014-08-18: 4 mg via INTRAVENOUS
  Administered 2014-08-18: 2 mg via INTRAVENOUS
  Filled 2014-08-16 (×2): qty 1
  Filled 2014-08-16: qty 2
  Filled 2014-08-16 (×3): qty 1
  Filled 2014-08-16: qty 2
  Filled 2014-08-16: qty 1

## 2014-08-16 MED ORDER — SODIUM CHLORIDE 0.9 % IJ SOLN
10.0000 mL | INTRAMUSCULAR | Status: DC | PRN
  Administered 2014-08-17: 20 mL
  Administered 2014-08-24 – 2014-09-01 (×2): 10 mL
  Administered 2014-09-01: 20 mL
  Administered 2014-09-02: 10 mL
  Filled 2014-08-16 (×5): qty 40

## 2014-08-16 NOTE — Procedures (Signed)
Successful fluoroscopic guided removal of occlusion balloon and vascular sheath following BRTO procedure performed 4/18. No immediate post procedural complication. Hemostasis achieved at right groin access site with manual compression. Pt to keep R leg flat x 2 hrs (until 11:00).

## 2014-08-16 NOTE — Progress Notes (Signed)
[facility] TEAM 1 - Stepdown/ICU TEAM Progress Note  John [name] ZYS:[id number] DOB: 05/04/1955 DOA: 08/14/2014 PCP: No PCP Per Patient  Admit HPI / Brief Narrative: John [name] is a 59 y.o. BM PMHx Alcoholism, Alcoholic Liver Cirrhosis, coagulopathy, Polysubstance Abuse, accelerated hypertension, thrombocytopenia, who was recently discharged from the hospitalist service on 07/28/2014 at which time he was treated for new onset ascites undergoing paracentesis with removal of 2 L of ascitic fluid.  Presents to the emergency department with complaints of hematemesis over the past 3 days as well as bright red blood per rectum. He was noted have several episodes of bloody/maroon colored liquid consistency stools in the emergency department. CBC showed a hemoglobin of 5.4 with hematocrit of 16 and platelet count of 98,000. He has an INR 1.61 with PT of 19.3. He was urgently typed and crossed and received his first unit of packed red blood cells while in the emergency department. Last blood pressure reading was 125/74 with a pulse of 80, satting 100% on 2 L up in oxygen via nasal cannula. Patient also complains of abdominal pain located in the epigastric region. He takes Aleve as well as aspirin on a daily basis, and tells me he has only had one beer since his last hospitalization. I spoke with Dr. Fuller Plan of GI from the emergency department.   HPI/Subjective: 4/19 A/O 4, negative SOB, negative CP, positive abdominal pain with palpation.    Assessment/Plan: Upper GI bleed.  -4/18 S/P Balloon-occluded Retrograde Transvenous Obliteration (BRTO) by GI with balloon removed today. -Patient to remain nothing by mouth except for sips with medication my: Explain this to the patient. -Hemoglobin stable -Continue Protonix drip and Octreotide drip per GI - 4/18 transfused 3 units PRBC   -Received 10 mg of IV vitamin K  Acute blood loss anemia/gastric varices bleeding -See upper  GI bleed -PICC line placement pending; patient's peripheral IVs failing  Alcohol-induced liver cirrhosis with ascites -Last month paracentesis; 2 L fluid removed  Thrombocytopenia  -Most likely secondary to alcohol liver cirrhosis -Will avoid all heparin products, NSAIDs -Monitor closely   Acute on chronic renal failure (baseline 1.49 on 3/16)  -Trending down monitor closely -Continue normal saline 61ml/hr  Elevated transaminases. - AST 114 with ALT of 45. This ratio would b consistent with alcoholic hepatitis.   Chronic alcoholism. - Will check an alcohol level,  -Continue CIWA protocol (4/17 alcohol negative)  Hypertension.  -Hold all BP medication    Code Status: FULL Family Communication: no family present at time of exam Disposition Plan: Per GI    Consultants: Dr.Malcolm T Fuller Plan (GI) Dr.Jaime Earleen Newport (IR)   Procedure/Significant Events:  4/18 transfused 3 units PRBC 4/18 S/P Balloon-occluded Retrograde Transvenous Obliteration (BRTO)  Culture   Antibiotics: Ciprofloxacin 4/17>>   DVT prophylaxis: SCD   Devices    LINES / TUBES:      Continuous Infusions: . sodium chloride 75 mL/hr at 08/16/14 1211  . octreotide  (SANDOSTATIN)    IV infusion 50 mcg/hr (08/16/14 1316)  . pantoprozole (PROTONIX) infusion 8 mg/hr (08/16/14 1640)    Objective: VITAL SIGNS: Temp: 98.7 F (37.1 C) (04/19 1633) Temp Source: Oral (04/19 1633) BP: 149/79 mmHg (04/19 1633) Pulse Rate: 82 (04/19 1633) SPO2; FIO2:   Intake/Output Summary (Last 24 hours) at 08/16/14 2016 Last data filed at 08/16/14 1800  Gross per 24 hour  Intake 3114.75 ml  Output   1400 ml  Net 1714.75 ml     Exam: General: Alert, follows all commands  answers all questions, some mild confusion, No acute respiratory distress Lungs: Clear to auscultation bilaterally without wheezes or crackles Cardiovascular: Regular rate and rhythm without murmur gallop or rub normal S1 and  S2 Abdomen: Epigastric/RUQ tenderness to palpation, distended, firm, bowel sounds positive, no rebound, no ascites, no appreciable mass, negative fluid wave Extremities: No significant cyanosis, clubbing, or edema bilateral lower extremities  Data Reviewed: Basic Metabolic Panel:  Recent Labs Lab 08/14/14 1233 08/15/14 0811 08/16/14 0400  NA 145 144 147*  K 5.6* 4.5 4.0  CL 116* 114* 119*  CO2 23 21 21   GLUCOSE 101* 131* 138*  BUN 46* 48* 38*  CREATININE 2.01* 2.51* 2.25*  CALCIUM 8.0* 8.0* 8.0*   Liver Function Tests:  Recent Labs Lab 08/14/14 1233 08/16/14 0400  AST 114* 200*  ALT 45 75*  ALKPHOS 51 50  BILITOT 1.6* 3.2*  PROT 5.4* 5.4*  ALBUMIN 1.9* 1.9*   No results for input(s): LIPASE, AMYLASE in the last 168 hours. No results for input(s): AMMONIA in the last 168 hours. CBC:  Recent Labs Lab 08/14/14 1233 08/15/14 0811 08/15/14 2200 08/16/14 0400  WBC 10.1 8.7 6.7 7.7  NEUTROABS 6.2  --   --  5.9  HGB 5.4* 8.2* 7.8* 8.1*  HCT 16.0* 23.1* 22.7* 23.7*  MCV 86.5 83.1 84.4 84.9  PLT 98* 72* 63* 62*   Cardiac Enzymes: No results for input(s): CKTOTAL, CKMB, CKMBINDEX, TROPONINI in the last 168 hours. BNP (last 3 results)  Recent Labs  06/24/14 0802  BNP 1168.5*    ProBNP (last 3 results) No results for input(s): PROBNP in the last 8760 hours.  CBG:  Recent Labs Lab 08/15/14 0003  GLUCAP 113*    Recent Results (from the past 240 hour(s))  MRSA PCR Screening     Status: None   Collection Time: 08/15/14 12:24 AM  Result Value Ref Range Status   MRSA by PCR NEGATIVE NEGATIVE Final    Comment:        The GeneXpert MRSA Assay (FDA approved for NASAL specimens only), is one component of a comprehensive MRSA colonization surveillance program. It is not intended to diagnose MRSA infection nor to guide or monitor treatment for MRSA infections.      Studies:  Recent x-ray studies have been reviewed in detail by the Attending  Physician  Scheduled Meds:  Scheduled Meds: . sodium chloride   Intravenous Once  . antiseptic oral rinse  7 mL Mouth Rinse BID  . ciprofloxacin  400 mg Intravenous Q12H  . [START ON 08/17/2014] pantoprazole (PROTONIX) IV  40 mg Intravenous Q12H  . sodium chloride  10-40 mL Intracatheter Q12H  . sodium chloride  3 mL Intravenous Q12H    Time spent on care of this patient: 40 mins   John [name], John [name] , MD  [facility] Office  [phone] Pager - [phone]  On-Call/Text Page:      [email or web address]      password [email or web address]  If 7PM-7AM, please contact night-coverage [email or web address] Password [email or web address] 08/16/2014, 8:16 PM   LOS: 2 days   Care during the described time interval was provided by me .  I have reviewed this patient's available data, including medical history, events of note, physical examination, radiology studies and test results as part of my evaluation  Dia Crawford, MD [phone] Pager

## 2014-08-16 NOTE — Progress Notes (Signed)
Peripherally Inserted Central Catheter/Midline Placement  The IV Nurse has discussed with the patient and/or persons authorized to consent for the patient, the purpose of this procedure and the potential benefits and risks involved with this procedure.  The benefits include less needle sticks, lab draws from the catheter and patient may be discharged home with the catheter.  Risks include, but not limited to, infection, bleeding, blood clot (thrombus formation), and puncture of an artery; nerve damage and irregular heat beat.  Alternatives to this procedure were also discussed.  PICC/Midline Placement Documentation     Information given to brother, Ashante, [place] 08/16/2014, 5:42 PM

## 2014-08-16 NOTE — Progress Notes (Signed)
[facility] Progress Note  Subjective:  Groggy, had ballon removed this morning and is still sedated.  Had BRTO yeasterday, Hgb this morning 8.1. LFTs mildly elevated toay--likely due to instrumentation/ temporary alteration of blood flow from procedure.   Objective:  Vital signs in last 24 hours: Temp:  [97.7 F (36.5 C)-98.3 F (36.8 C)] 97.7 F (36.5 C) (04/19 0801) Pulse Rate:  [69-83] 80 (04/19 0700) Resp:  [12-28] 16 (04/19 0700) BP: (112-157)/(56-89) 157/82 mmHg (04/19 0700) SpO2:  [92 %-100 %] 96 % (04/19 0700) Arterial Line BP: (107-156)/(54-94) 147/86 mmHg (04/19 0700) Last BM Date: 08/15/14 General:   Groggy, rousable Heart:  Regular rate and rhythm; no murmurs Abdomen:  Soft, nontender , distended., percusion with tympany, gutters with solid sounds--likely fluid, without guarding, and without rebound.   Extremities:  Without edema. Neurologic:groggy, sedated   Intake/Output from previous day: 04/18 0701 - 04/19 0700 In: 5914.8 [P.O.:240; I.V.:5224.8; IV Piggyback:450] Out: 1450 [Urine:1400; Blood:50] Intake/Output this shift:    Lab Results:  Recent Labs  08/15/14 0811 08/15/14 2200 08/16/14 0400  WBC 8.7 6.7 7.7  HGB 8.2* 7.8* 8.1*  HCT 23.1* 22.7* 23.7*  PLT 72* 63* 62*   BMET  Recent Labs  08/14/14 1233 08/15/14 0811 08/16/14 0400  NA 145 144 147*  K 5.6* 4.5 4.0  CL 116* 114* 119*  CO2 23 21 21   GLUCOSE 101* 131* 138*  BUN 46* 48* 38*  CREATININE 2.01* 2.51* 2.25*  CALCIUM 8.0* 8.0* 8.0*   LFT  Recent Labs  08/16/14 0400  PROT 5.4*  ALBUMIN 1.9*  AST 200*  ALT 75*  ALKPHOS 50  BILITOT 3.2*   PT/INR  Recent Labs  08/14/14 1233 08/16/14 0400  LABPROT 19.3* 17.8*  INR 1.61* 1.45      Ir Angiogram Selective Each Additional Vessel  08/16/2014   CLINICAL DATA:  Hepatitis-C, cirrhosis, portal hypertension, bleeding gastric varices with a patent gastro renal shunt  EXAM: ULTRASOUND GUIDANCE FOR VASCULAR  ACCESS  BALLOON OCCLUSION RETROGRADE TRANSVENOUS OBLITERATION OF GASTRIC VARICES (BRTO)  Date:  4/18/20164/18/2016 6:05 pm  Radiologist:  Jerilynn Mages. Daryll Brod, MD  Guidance:  Ultrasound and fluoroscopic  FLUOROSCOPY TIME:  28.3 minutes, 2,586 mGy  MEDICATIONS AND MEDICAL HISTORY: General  ANESTHESIA/SEDATION: General  CONTRAST:  66mL OMNIPAQUE IOHEXOL 300 MG/ML  SOLN  COMPLICATIONS: None immediate  PROCEDURE: Informed consent was obtained from the patient following explanation of the procedure, risks, benefits and alternatives. The patient understands, agrees and consents for the procedure. All questions were addressed. A time out was performed.  Maximal barrier sterile technique utilized including caps, mask, sterile gowns, sterile gloves, large sterile drape, hand hygiene, and Betadine.  Under sterile conditions and local anesthesia, ultrasound micropuncture right common femoral venous access performed. Images obtained for documentation. Over a Bentson guidewire, a 13 [place] TIPS sheath was advanced into the IVC at the level of the left renal vein. C2 catheter was utilized to access the left renal vein. Selective left renal venogram confirms patency of the left renal vein. A Rosen guidewire was advanced peripherally into the left renal lower pole branches. Parallel to the [facility] guidewire, a Kumpe catheter was utilized to select the left gastro renal shunt. Catheter was advanced into the left gastro renal shunt over a Glidewire. Selective venogram performed of the gastro renal shunt in a retrograde fashion. This confirms position in the gastro renal shunt. An Amplatz guidewire was coiled in the gastro renal shunt. Over the Amplatz guidewire, the occlusion  balloon was advanced into the gastro renal shunt. The 10 French sheath was also advanced to the origin of the gastro renal shunt to secure position. Balloon occlusion retrograde venogram performed. Venograms were performed with contrast and with CO2.  Initial venogram  demonstrates small inferior phrenic veins with collateral communication to the IVC.  The Lantern micro catheter was advanced over a phathom guidewire more peripherally into the gastro renal shunt. Repeat venogram confirms patency of the small inferior phrenic collaterals communicating to the IVC. The small collateral pathways were accessed with the micro catheter. Micro coil embolization was performed of the dominant feeding venous channel to the collateral phrenic to IVC pathway. 2 5 mm x 30 mm POD micro coils were deployed within the collateral venous channel.  Micro catheter was retracted and re- advanced into the gastro renal shunt peripherally. Contrast injection performed with contrast and with CO2. This demonstrates preferential flow in a retrograde fashion into the dominant gastric varices. From this position, sclerotherapy was performed with 3% STS, Lipiodol, and air mixture. A total of 20 cc of sclerotherapy mixture injected in a retrograde fashion. There was good dispersal into the gastric varices.  Micro catheter was retracted close to the occlusion balloon position. Contrast injection at this level demonstrates extravasation of contrast, suspect related to pressure from the occlusion balloon. From this position, 2 32 x 60 mm RUBY coils were deployed. Because of the venous extravasation/ injury at the occlusion balloon site, the occlusion balloon will be left gently inflated through the sheath in this position overnight.  Patient was extubated and awakened. He was stable for recovery in the PACU. Overall he tolerated the procedure well.  IMPRESSION: Successful balloon occlusion retrograde trans venous obliteration of the gastric varices.   Electronically Signed   By: Jerilynn Mages.  Shick M.D.   On: 08/16/2014 08:52   Ir Angiogram Follow Up Study  08/16/2014   CLINICAL DATA:  Hepatitis-C, cirrhosis, portal hypertension, bleeding gastric varices with a patent gastro renal shunt  EXAM: ULTRASOUND GUIDANCE FOR  VASCULAR ACCESS  BALLOON OCCLUSION RETROGRADE TRANSVENOUS OBLITERATION OF GASTRIC VARICES (BRTO)  Date:  4/18/20164/18/2016 6:05 pm  Radiologist:  Jerilynn Mages. Daryll Brod, MD  Guidance:  Ultrasound and fluoroscopic  FLUOROSCOPY TIME:  28.3 minutes, 2,586 mGy  MEDICATIONS AND MEDICAL HISTORY: General  ANESTHESIA/SEDATION: General  CONTRAST:  66mL OMNIPAQUE IOHEXOL 300 MG/ML  SOLN  COMPLICATIONS: None immediate  PROCEDURE: Informed consent was obtained from the patient following explanation of the procedure, risks, benefits and alternatives. The patient understands, agrees and consents for the procedure. All questions were addressed. A time out was performed.  Maximal barrier sterile technique utilized including caps, mask, sterile gowns, sterile gloves, large sterile drape, hand hygiene, and Betadine.  Under sterile conditions and local anesthesia, ultrasound micropuncture right common femoral venous access performed. Images obtained for documentation. Over a Bentson guidewire, a 15 [place] TIPS sheath was advanced into the IVC at the level of the left renal vein. C2 catheter was utilized to access the left renal vein. Selective left renal venogram confirms patency of the left renal vein. A Rosen guidewire was advanced peripherally into the left renal lower pole branches. Parallel to the [facility] guidewire, a Kumpe catheter was utilized to select the left gastro renal shunt. Catheter was advanced into the left gastro renal shunt over a Glidewire. Selective venogram performed of the gastro renal shunt in a retrograde fashion. This confirms position in the gastro renal shunt. An Amplatz guidewire was coiled in the gastro renal shunt. Over  the Amplatz guidewire, the occlusion balloon was advanced into the gastro renal shunt. The 10 French sheath was also advanced to the origin of the gastro renal shunt to secure position. Balloon occlusion retrograde venogram performed. Venograms were performed with contrast and with CO2.  Initial  venogram demonstrates small inferior phrenic veins with collateral communication to the IVC.  The Lantern micro catheter was advanced over a phathom guidewire more peripherally into the gastro renal shunt. Repeat venogram confirms patency of the small inferior phrenic collaterals communicating to the IVC. The small collateral pathways were accessed with the micro catheter. Micro coil embolization was performed of the dominant feeding venous channel to the collateral phrenic to IVC pathway. 2 5 mm x 30 mm POD micro coils were deployed within the collateral venous channel.  Micro catheter was retracted and re- advanced into the gastro renal shunt peripherally. Contrast injection performed with contrast and with CO2. This demonstrates preferential flow in a retrograde fashion into the dominant gastric varices. From this position, sclerotherapy was performed with 3% STS, Lipiodol, and air mixture. A total of 20 cc of sclerotherapy mixture injected in a retrograde fashion. There was good dispersal into the gastric varices.  Micro catheter was retracted close to the occlusion balloon position. Contrast injection at this level demonstrates extravasation of contrast, suspect related to pressure from the occlusion balloon. From this position, 2 32 x 60 mm RUBY coils were deployed. Because of the venous extravasation/ injury at the occlusion balloon site, the occlusion balloon will be left gently inflated through the sheath in this position overnight.  Patient was extubated and awakened. He was stable for recovery in the PACU. Overall he tolerated the procedure well.  IMPRESSION: Successful balloon occlusion retrograde trans venous obliteration of the gastric varices.   Electronically Signed   By: Jerilynn Mages.  Shick M.D.   On: 08/16/2014 08:52   Ir Fluoro Rm 30-60 Min  08/16/2014   CLINICAL DATA:  Post BRTO procedure performed secondary to bleeding gastric varices (4/18). Patient returns today for fluoroscopic guided removal of the  occlusion balloon and guiding vascular sheath.  EXAM: IR FLOURO RM 0-60 MIN  MEDICATIONS: None  CONTRAST:  None  FLUOROSCOPY TIME:  12 seconds (7.4 mGy).  COMPARISON:  BRTO procedure - 08/15/2014; CT abdomen and pelvis- 07/26/2014  TECHNIQUE: Patient was placed supine on the fluoroscopy table.  A fluoroscopic image was obtained of the left upper abdominal quadrant.  Under intermittent fluoroscopic guidance, the occlusion balloon was deflated and the balloon an guiding vascular sheath were removed. A post removal fluoroscopic image was obtained.  Hemostasis was achieved at the right groin access site with manual compression. A dressing was placed. The patient tolerated the procedure well without immediate postprocedural complication.  FINDINGS: Preprocedural spot fluoroscopic image demonstrates unchanged positioning of the sclerosant as well as embolization coil packs overlying the expected location of the gastric fundus.  The sclerosant and embolization coil packs are unchanged in appearance following successful fluoroscopic guided removal of both the occlusion balloon an guiding vascular sheath.  IMPRESSION: Successful fluoroscopic guided removal of occlusion balloon and guiding vascular sheath post BRT0 procedure.   Electronically Signed   By: Sandi Mariscal M.D.   On: 08/16/2014 09:10   [place] [facility] [employer]  08/16/2014   CLINICAL DATA:  Hepatitis-C, cirrhosis, portal hypertension, bleeding gastric varices with a patent gastro renal shunt  EXAM: ULTRASOUND GUIDANCE FOR VASCULAR ACCESS  BALLOON OCCLUSION RETROGRADE TRANSVENOUS OBLITERATION OF GASTRIC  VARICES (BRTO)  Date:  4/18/20164/18/2016 6:05 pm  Radiologist:  M. Daryll Brod, MD  Guidance:  Ultrasound and fluoroscopic  FLUOROSCOPY TIME:  28.3 minutes, 2,586 mGy  MEDICATIONS AND MEDICAL HISTORY: General  ANESTHESIA/SEDATION: General  CONTRAST:  58mL OMNIPAQUE IOHEXOL 300 MG/ML  SOLN  COMPLICATIONS: None immediate   PROCEDURE: Informed consent was obtained from the patient following explanation of the procedure, risks, benefits and alternatives. The patient understands, agrees and consents for the procedure. All questions were addressed. A time out was performed.  Maximal barrier sterile technique utilized including caps, mask, sterile gowns, sterile gloves, large sterile drape, hand hygiene, and Betadine.  Under sterile conditions and local anesthesia, ultrasound micropuncture right common femoral venous access performed. Images obtained for documentation. Over a Bentson guidewire, a 31 [place] TIPS sheath was advanced into the IVC at the level of the left renal vein. C2 catheter was utilized to access the left renal vein. Selective left renal venogram confirms patency of the left renal vein. A Rosen guidewire was advanced peripherally into the left renal lower pole branches. Parallel to the [facility] guidewire, a Kumpe catheter was utilized to select the left gastro renal shunt. Catheter was advanced into the left gastro renal shunt over a Glidewire. Selective venogram performed of the gastro renal shunt in a retrograde fashion. This confirms position in the gastro renal shunt. An Amplatz guidewire was coiled in the gastro renal shunt. Over the Amplatz guidewire, the occlusion balloon was advanced into the gastro renal shunt. The 10 French sheath was also advanced to the origin of the gastro renal shunt to secure position. Balloon occlusion retrograde venogram performed. Venograms were performed with contrast and with CO2.  Initial venogram demonstrates small inferior phrenic veins with collateral communication to the IVC.  The Lantern micro catheter was advanced over a phathom guidewire more peripherally into the gastro renal shunt. Repeat venogram confirms patency of the small inferior phrenic collaterals communicating to the IVC. The small collateral pathways were accessed with the micro catheter. Micro coil embolization was  performed of the dominant feeding venous channel to the collateral phrenic to IVC pathway. 2 5 mm x 30 mm POD micro coils were deployed within the collateral venous channel.  Micro catheter was retracted and re- advanced into the gastro renal shunt peripherally. Contrast injection performed with contrast and with CO2. This demonstrates preferential flow in a retrograde fashion into the dominant gastric varices. From this position, sclerotherapy was performed with 3% STS, Lipiodol, and air mixture. A total of 20 cc of sclerotherapy mixture injected in a retrograde fashion. There was good dispersal into the gastric varices.  Micro catheter was retracted close to the occlusion balloon position. Contrast injection at this level demonstrates extravasation of contrast, suspect related to pressure from the occlusion balloon. From this position, 2 32 x 60 mm RUBY coils were deployed. Because of the venous extravasation/ injury at the occlusion balloon site, the occlusion balloon will be left gently inflated through the sheath in this position overnight.  Patient was extubated and awakened. He was stable for recovery in the PACU. Overall he tolerated the procedure well.  IMPRESSION: Successful balloon occlusion retrograde trans venous obliteration of the gastric varices.   Electronically Signed   By: Jerilynn Mages.  Shick M.D.   On: 08/16/2014 08:52    ASSESSMENT/PLAN:   59 yo male s/p BRTO procedure, had balloon removed this am. Trend LFTs. Daily LFTs and INR. Monitor ammonia/mental status. Monitor Hgb.      LOS: 2 days  Jaydien Panepinto, Deloris Ping 08/16/2014, Pager [phone]

## 2014-08-17 ENCOUNTER — Encounter (HOSPITAL_COMMUNITY): Payer: Self-pay | Admitting: Interventional Radiology

## 2014-08-17 LAB — PROTIME-INR
INR: 1.6 — AB (ref 0.00–1.49)
Prothrombin Time: 19.2 seconds — ABNORMAL HIGH (ref 11.6–15.2)

## 2014-08-17 LAB — COMPREHENSIVE METABOLIC PANEL
ALBUMIN: 1.9 g/dL — AB (ref 3.5–5.2)
ALT: 61 U/L — ABNORMAL HIGH (ref 0–53)
AST: 138 U/L — AB (ref 0–37)
Alkaline Phosphatase: 49 U/L (ref 39–117)
Anion gap: 6 (ref 5–15)
BILIRUBIN TOTAL: 3.8 mg/dL — AB (ref 0.3–1.2)
BUN: 30 mg/dL — ABNORMAL HIGH (ref 6–23)
CALCIUM: 7.6 mg/dL — AB (ref 8.4–10.5)
CHLORIDE: 122 mmol/L — AB (ref 96–112)
CO2: 20 mmol/L (ref 19–32)
Creatinine, Ser: 2.35 mg/dL — ABNORMAL HIGH (ref 0.50–1.35)
GFR calc Af Amer: 34 mL/min — ABNORMAL LOW (ref >90)
GFR calc non Af Amer: 29 mL/min — ABNORMAL LOW (ref >90)
Glucose, Bld: 139 mg/dL — ABNORMAL HIGH (ref 70–99)
Potassium: 3.8 mmol/L (ref 3.5–5.1)
SODIUM: 148 mmol/L — AB (ref 135–145)
Total Protein: 5.2 g/dL — ABNORMAL LOW (ref 6.0–8.3)

## 2014-08-17 LAB — CBC
HCT: 23.9 % — ABNORMAL LOW (ref 39.0–52.0)
HCT: 24.1 % — ABNORMAL LOW (ref 39.0–52.0)
Hemoglobin: 7.8 g/dL — ABNORMAL LOW (ref 13.0–17.0)
Hemoglobin: 8 g/dL — ABNORMAL LOW (ref 13.0–17.0)
MCH: 29.3 pg (ref 26.0–34.0)
MCH: 29.3 pg (ref 26.0–34.0)
MCHC: 32.6 g/dL (ref 30.0–36.0)
MCHC: 33.2 g/dL (ref 30.0–36.0)
MCV: 89.8 fL (ref 78.0–100.0)
MCV: 88.3 fL (ref 78.0–100.0)
Platelets: 63 10*3/uL — ABNORMAL LOW (ref 150–400)
Platelets: 63 10*3/uL — ABNORMAL LOW (ref 150–400)
RBC: 2.66 MIL/uL — ABNORMAL LOW (ref 4.22–5.81)
RBC: 2.73 MIL/uL — ABNORMAL LOW (ref 4.22–5.81)
RDW: 19.8 % — AB (ref 11.5–15.5)
RDW: 20 % — ABNORMAL HIGH (ref 11.5–15.5)
WBC: 10.1 10*3/uL (ref 4.0–10.5)
WBC: 10.6 10*3/uL — ABNORMAL HIGH (ref 4.0–10.5)

## 2014-08-17 LAB — GLUCOSE, CAPILLARY: Glucose-Capillary: 92 mg/dL (ref 70–99)

## 2014-08-17 MED ORDER — PHYTONADIONE 5 MG PO TABS
5.0000 mg | ORAL_TABLET | Freq: Every day | ORAL | Status: DC
  Filled 2014-08-17: qty 1

## 2014-08-17 MED ORDER — PHYTONADIONE 5 MG PO TABS
10.0000 mg | ORAL_TABLET | Freq: Every day | ORAL | Status: DC
  Administered 2014-08-17 – 2014-08-26 (×9): 10 mg via ORAL
  Filled 2014-08-17 (×10): qty 2

## 2014-08-17 MED ORDER — FUROSEMIDE 10 MG/ML IJ SOLN
40.0000 mg | Freq: Two times a day (BID) | INTRAMUSCULAR | Status: DC
  Administered 2014-08-17 – 2014-08-24 (×15): 40 mg via INTRAVENOUS
  Filled 2014-08-17 (×17): qty 4

## 2014-08-17 MED ORDER — LACTULOSE 10 GM/15ML PO SOLN
20.0000 g | Freq: Every day | ORAL | Status: DC
  Administered 2014-08-17 – 2014-08-22 (×5): 20 g via ORAL
  Filled 2014-08-17 (×6): qty 30

## 2014-08-17 MED ORDER — SPIRONOLACTONE 50 MG PO TABS
50.0000 mg | ORAL_TABLET | Freq: Every day | ORAL | Status: DC
Start: 2014-08-18 — End: 2014-08-24
  Administered 2014-08-18 – 2014-08-24 (×6): 50 mg via ORAL
  Filled 2014-08-17 (×7): qty 1

## 2014-08-17 NOTE — Progress Notes (Signed)
Daily Rounding Note  08/17/2014, 8:13 AM  LOS: 3 days   SUBJECTIVE:       No BM, but RN says pt feels like he needs to have BM.  C/O pain in left leg and of PAS hose causing irritation.  No belly pain, no nausea.   OBJECTIVE:         Vital signs in last 24 hours:    Temp:  [98.4 F (36.9 C)-100 F (37.8 C)] 99.3 F (37.4 C) (04/20 0413) Pulse Rate:  [74-89] 89 (04/20 0800) Resp:  [13-25] 25 (04/20 0413) BP: (115-159)/(61-130) 126/64 mmHg (04/20 0413) SpO2:  [90 %-100 %] 90 % (04/20 0413) Arterial Line BP: (108-133)/(92-106) 108/92 mmHg (04/19 1100) Last BM Date: 08/15/14 Filed Weights   08/15/14 0000  Weight: 252 lb 6.8 oz (114.5 kg)   General: obese, looks ill.     Heart: RRR Chest: cough, chest clear.  Some dyspnea with speach Abdomen: obese, active BS, NT.  soft  Extremities: obese, no pitting edema Neuro/Psych:  Awake, moderately lethargic.  Oriented to self.  Speech difficult to make out, slurs his words and dry oral mucosa may be a culprit.   Intake/Output from previous day: 04/19 0701 - 04/20 0700 In: 1425 [I.V.:1425] Out: 1100 [Urine:1100]  Intake/Output this shift:    Lab Results:  Recent Labs  08/15/14 0811 08/15/14 2200 08/16/14 0400  WBC 8.7 6.7 7.7  HGB 8.2* 7.8* 8.1*  HCT 23.1* 22.7* 23.7*  PLT 72* 63* 62*   BMET  Recent Labs  08/14/14 1233 08/15/14 0811 08/16/14 0400  NA 145 144 147*  K 5.6* 4.5 4.0  CL 116* 114* 119*  CO2 23 21 21   GLUCOSE 101* 131* 138*  BUN 46* 48* 38*  CREATININE 2.01* 2.51* 2.25*  CALCIUM 8.0* 8.0* 8.0*   LFT  Recent Labs  08/14/14 1233 08/16/14 0400  PROT 5.4* 5.4*  ALBUMIN 1.9* 1.9*  AST 114* 200*  ALT 45 75*  ALKPHOS 51 50  BILITOT 1.6* 3.2*   PT/INR  Recent Labs  08/14/14 1233 08/16/14 0400  LABPROT 19.3* 17.8*  INR 1.61* 1.45    Studies/Results: Ir US Guide Vasc Access Right [place] Alligator Hemorr Lymph [employer]  [facility] 08/16/2014   CLINICAL DATA:  Hepatitis-C, cirrhosis, portal hypertension, 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 Daryll Brod, MD  Guidance:  Ultrasound and fluoroscopic  FLUOROSCOPY TIME:  28.3 minutes, 2,586 mGy  MEDICATIONS AND MEDICAL HISTORY: General  ANESTHESIA/SEDATION: General  CONTRAST:  2mL OMNIPAQUE IOHEXOL 300 MG/ML  SOLN  COMPLICATIONS: None immediate  PROCEDURE: Informed consent was obtained from the patient following explanation of the procedure, risks, benefits and alternatives. The patient understands, agrees and consents for the procedure. All questions were addressed. A time out was performed.  Maximal barrier sterile technique utilized including caps, mask, sterile gowns, sterile gloves, large sterile drape, hand hygiene, and Betadine.  Under sterile conditions and local anesthesia, ultrasound micropuncture right common femoral venous access performed. Images obtained for documentation. Over a Bentson guidewire, a 37 [place] TIPS sheath was advanced into the IVC at the level of the left renal vein. C2 catheter was utilized to access the left renal vein. Selective left renal venogram confirms patency of the left renal vein. A Constance Holster  guidewire was advanced peripherally into the left renal lower pole branches. Parallel to the [facility] guidewire, a Kumpe catheter was utilized to select the left gastro renal shunt. Catheter was advanced into the left gastro renal shunt over a Glidewire. Selective venogram performed of the gastro renal shunt in a retrograde fashion. This confirms position in the gastro renal shunt. An Amplatz guidewire was coiled in the gastro renal shunt. Over the Amplatz guidewire, the occlusion balloon was advanced into the gastro renal shunt. The 10 French sheath was also  advanced to the origin of the gastro renal shunt to secure position. Balloon occlusion retrograde venogram performed. Venograms were performed with contrast and with CO2.  Initial venogram demonstrates small inferior phrenic veins with collateral communication to the IVC.  The Lantern micro catheter was advanced over a phathom guidewire more peripherally into the gastro renal shunt. Repeat venogram confirms patency of the small inferior phrenic collaterals communicating to the IVC. The small collateral pathways were accessed with the micro catheter. Micro coil embolization was performed of the dominant feeding venous channel to the collateral phrenic to IVC pathway. 2 5 mm x 30 mm POD micro coils were deployed within the collateral venous channel.  Micro catheter was retracted and re- advanced into the gastro renal shunt peripherally. Contrast injection performed with contrast and with CO2. This demonstrates preferential flow in a retrograde fashion into the dominant gastric varices. From this position, sclerotherapy was performed with 3% STS, Lipiodol, and air mixture. A total of 20 cc of sclerotherapy mixture injected in a retrograde fashion. There was good dispersal into the gastric varices.  Micro catheter was retracted close to the occlusion balloon position. Contrast injection at this level demonstrates extravasation of contrast, suspect related to pressure from the occlusion balloon. From this position, 2 32 x 60 mm RUBY coils were deployed. Because of the venous extravasation/ injury at the occlusion balloon site, the occlusion balloon will be left gently inflated through the sheath in this position overnight.  Patient was extubated and awakened. He was stable for recovery in the PACU. Overall he tolerated the procedure well.  IMPRESSION: Successful balloon occlusion retrograde trans venous obliteration of the gastric varices.   Electronically Signed   By: Jerilynn Mages.  Shick M.D.   On: 08/16/2014 08:52   Scheduled  Meds: . sodium chloride   Intravenous Once  . antiseptic oral rinse  7 mL Mouth Rinse BID  . ciprofloxacin  400 mg Intravenous Q12H  . folic acid  1 mg Oral Daily  . LORazepam  0-4 mg Intravenous Q6H   Followed by  . [START ON 08/18/2014] LORazepam  0-4 mg Intravenous Q12H  . multivitamin with minerals  1 tablet Oral Daily  . pantoprazole (PROTONIX) IV  40 mg Intravenous Q12H  . sodium chloride  10-40 mL Intracatheter Q12H  . sodium chloride  3 mL Intravenous Q12H  . thiamine  100 mg Oral Daily   Or  . thiamine  100 mg Intravenous Daily   Continuous Infusions: . sodium chloride 75 mL/hr at 08/16/14 1211  . octreotide  (SANDOSTATIN)    IV infusion 50 mcg/hr (08/16/14 1316)  . pantoprozole (PROTONIX) infusion 8 mg/hr (08/16/14 1640)   PRN Meds:.LORazepam **OR** LORazepam, morphine injection, ondansetron **OR** ondansetron (ZOFRAN) IV, oxyCODONE, sodium chloride  ASSESMENT:   *  Cirrhosis, due to ETOH, ? Obesity and NASH, hep C.  Substance abuse.  High Viral load @ [id number].  Active ETOH just PTA. ETOH hepatitis.   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  EGD 4/18.  S/p BRTO 4/19  *  ABL anemia.  S/p 3 PRBCs  *  Coagulopathy,  Thrombocytopenia. Coags improved post vit K.  Platelets gradually worsening.    *  AKI. CKD. Labs improved. Will need to monitor diuretics going forward.    *  Ascites, onset 06/2014. Paracentesis 1.9 liters 07/27/14, labs not c/w SBP.  Home on Aldactone 50, Lasix 20 07/28/14.   *  Left vision loss, ? Due to optic neuritis.  Lesions on spine, ?multiple sclerosis.   *  Chronic back pain.   *  AMS, improving.  Ammonia level last admission was normal.    PLAN   *  Complete 5 days of abx (cipro day 5 on 4/21) in setting of acute variceal bleed.  Finish Octreotide after total 72 hours (tonight).  Stop Protonix.  Start clears but only take trays when sitting upright. Advance to carb mod as tolerated.  *  Lactulose 30 ml daily unless  having excessive stools.      John [name]  08/17/2014, 8:13 AM Pager: [phone]

## 2014-08-17 NOTE — Progress Notes (Addendum)
Medication Reconciliation Review:  Patient has the following medications listed as prescribed for him.    1.  Amlodipine 10mg  daily - written 06/27/2014 and sent to [facility] (they have not filled anything in over 2 years).  2.  Aspirin EC 325mg  daily - written 06/27/2014 and sent to [facility] (they have not filled anything in over 2 years).  3.  Lasix 20mg  daily - written 07/28/14 for #30, sent to [facility] (they have not filled anything in over 2 years).  4.  Metoprolol Tartrate 25 mg (2 tabs for a total of 50mg ) twice daily - written 07/28/14 for #60, sent to [facility] (they have not filled anything in over 2 years).  5.  Spironolactone 50mg  daily - written 07/28/14 for #30, sent to [facility] (they have not filled anything in over 2 years).  6.  Tramadol 50mg  one tablet every 12 hours as needed for moderate/severe pain (written in Feb. 2016 for 30 tablets and no refills - [facility] hasn't filled anything in over 2 years)  I have marked his med-rec as meds unknown.  The patient states, "I don't know what I take".  Please review and resume those you fill appropriate at discharge.  Rober Minion, PharmD., MS Clinical Pharmacist Pager:  [phone] Thank you for allowing pharmacy to be part of this patients care team.

## 2014-08-17 NOTE — Progress Notes (Signed)
[place] TEAM 1 - Stepdown/ICU TEAM Progress Note  Owens Hara XFG:[id number] DOB: December 31, 1955 DOA: 08/14/2014 PCP: No PCP Per Patient  Admit HPI / Brief Narrative: 59 y.o. male with a history of alcoholism, cirrhosis, coagulopathy, and polysubstance abuse who was discharged from the Hospitalist service on 07/28/2014 having undergone treatment for new onset ascites via paracentesis with removal of 2 L of fluid. He returned to the ED 4/17 with complaints of hematemesis over 3 days as well as bright red blood per rectum. He had several episodes of bloody/maroon liquid stools in the ED. He had a hemoglobin of 5.4 and platelet count of 98,000. INR 1.61 with PT of 19.3. Blood pressure was 125/74 with a pulse of 80, satting 100% on 2 L. Patient complained of abdominal pain located in the epigastric region. He takes Aleve as well as aspirin on a daily basis, and reported he had only had one beer since his last hospitalization.   HPI/Subjective: Patient is tolerating clear liquids today without difficulty.  He denies chest pain shortness of breath or abdominal pain.  He has not been vomiting today.  There is no evidence of obvious gross blood loss.  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 Noted via EGD - now s/p balloon-occluded transvenous obliteration per IR - is hemodynamically stable at present - IR continues to follow   Acute blood loss anemia Hemoglobin has improved status post 4 units of packed red blood cells - continue to follow trend in a serial fashion - transfuse as needed to keep Hgb 7.0 or >  Alcoholic and HCV cirrhosis with ascites Counseled patient as to the absolute need to discontinue all forms of alcohol immediately and completely and as to the likelihood of his early death should he choose to continue to drink - to f/u w/ GI after d/c    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  Code Status: FULL Family Communication: no family present at time of exam Disposition Plan: Stepdown unit  Consultants: GI Trudie Buckler IR   Procedures: 4/17 - EGD - Large gastric cardia varices with superficial ulcer 4/18 - BRTO of the gastric varices with STS sclerotherapy and coils 4/19 - Successful fluoroscopic guided removal of occlusion balloon and vascular sheath following BRTO procedure   Antibiotics: Cipro 4/17 >  DVT prophylaxis: SCDs  Objective: Blood pressure 156/85, pulse 85, temperature 99.8 F (37.7 C), temperature source Axillary, resp. rate 24, height 5\' 10"  (1.778 m), weight 114.5 kg (252 lb 6.8 oz), SpO2 99 %.  Intake/Output Summary (Last 24 hours) at 08/17/14 1555 Last data filed at 08/17/14 1000  Gross per 24 hour  Intake   1445 ml  Output   1150 ml  Net    295 ml   Exam: General: No acute respiratory distress - somewhat agitated  Lungs: Clear to auscultation bilaterally without wheezes or crackles Cardiovascular: Regular rate and rhythm without murmur gallop or rub Abdomen: Nontender, protuberent, soft, bowel sounds positive, no rebound, clear ascites, no appreciable mass Extremities: No significant cyanosis, or clubbing; 2+ edema bilateral lower extremities  Data Reviewed: Basic Metabolic  Panel:  Recent Labs Lab 08/14/14 1233 08/15/14 0811 08/16/14 0400  NA 145 144 147*  K 5.6* 4.5 4.0  CL 116* 114* 119*  CO2 23 21 21   GLUCOSE 101* 131* 138*  BUN 46* 48* 38*  CREATININE 2.01* 2.51* 2.25*  CALCIUM 8.0* 8.0* 8.0*    Liver Function  Tests:  Recent Labs Lab 08/14/14 1233 08/16/14 0400  AST 114* 200*  ALT 45 75*  ALKPHOS 51 50  BILITOT 1.6* 3.2*  PROT 5.4* 5.4*  ALBUMIN 1.9* 1.9*   Coags:  Recent Labs Lab 08/14/14 1150 08/14/14 1233 08/16/14 0400  INR NOT CALCULATED 1.61* 1.45   CBC:  Recent Labs Lab 08/14/14 1233 08/15/14 0811 08/15/14 2200 08/16/14 0400 08/17/14 1533  WBC 10.1 8.7 6.7 7.7 10.1  NEUTROABS 6.2  --   --  5.9  --   HGB 5.4* 8.2* 7.8* 8.1* 7.8*  HCT 16.0* 23.1* 22.7* 23.7* 23.9*  MCV 86.5 83.1 84.4 84.9 89.8  PLT 98* 72* 63* 62* 63*   CBG:  Recent Labs Lab 08/15/14 0003  GLUCAP 113*    Recent Results (from the past 240 hour(s))  MRSA PCR Screening     Status: None   Collection Time: 08/15/14 12:24 AM  Result Value Ref Range Status   MRSA by PCR NEGATIVE NEGATIVE Final    Comment:        The GeneXpert MRSA Assay (FDA approved for NASAL specimens only), is one component of a comprehensive MRSA colonization surveillance program. It is not intended to diagnose MRSA infection nor to guide or monitor treatment for MRSA infections.      Studies:   Recent x-ray studies have been reviewed in detail by the Attending Physician  Scheduled Meds:  Scheduled Meds: . sodium chloride   Intravenous Once  . antiseptic oral rinse  7 mL Mouth Rinse BID  . ciprofloxacin  400 mg Intravenous Q12H  . folic acid  1 mg Oral Daily  . lactulose  20 g Oral Daily  . LORazepam  0-4 mg Intravenous Q6H   Followed by  . [START ON 08/18/2014] LORazepam  0-4 mg Intravenous Q12H  . multivitamin with minerals  1 tablet Oral Daily  . sodium chloride  10-40 mL Intracatheter Q12H  . sodium chloride  3 mL Intravenous Q12H  . thiamine  100 mg Oral Daily   Or  . thiamine  100 mg Intravenous Daily    Time spent on care of this patient: 35 mins   Nirvana Blanchett T , MD   [facility] Office  [phone] Pager - Text Page per Shea Evans as per below:  On-Call/Text Page:       [email or web address]      password [email or web address]  If 7PM-7AM, please contact night-coverage [email or web address] Password [email or web address] 08/17/2014, 3:55 PM   LOS: 3 days

## 2014-08-17 NOTE — Progress Notes (Signed)
Subjective: Pt feels ok, thirsty for water this am. No N/V, denies abd pain  Objective: Physical Exam: BP 126/64 mmHg  Pulse 89  Temp(Src) 99.3 F (37.4 C) (Oral)  Resp 19  Ht 5\' 10"  (1.778 m)  Wt 252 lb 6.8 oz (114.5 kg)  BMI 36.22 kg/m2  SpO2 94% Pt awake and conversant, oriented to self and place. Heart: Regular Lungs: CTA Abd: soft, NT, +BS Ext: (R)groin soft, NT, no hematoma    Labs: CBC  Recent Labs  08/15/14 2200 08/16/14 0400  WBC 6.7 7.7  HGB 7.8* 8.1*  HCT 22.7* 23.7*  PLT 63* 62*   BMET  Recent Labs  08/15/14 0811 08/16/14 0400  NA 144 147*  K 4.5 4.0  CL 114* 119*  CO2 21 21  GLUCOSE 131* 138*  BUN 48* 38*  CREATININE 2.51* 2.25*  CALCIUM 8.0* 8.0*   LFT  Recent Labs  08/16/14 0400  PROT 5.4*  ALBUMIN 1.9*  AST 200*  ALT 75*  ALKPHOS 50  BILITOT 3.2*   PT/INR  Recent Labs  08/14/14 1233 08/16/14 0400  LABPROT 19.3* 17.8*  INR 1.61* 1.45     Studies/Results:  [place] Hemorr Lymph [employer] [facility]  08/16/2014   CLINICAL DATA:  Hepatitis-C, cirrhosis, portal hypertension, 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 Daryll Brod, MD  Guidance:  Ultrasound and fluoroscopic  FLUOROSCOPY TIME:  28.3 minutes, 2,586 mGy  MEDICATIONS AND MEDICAL HISTORY: General  ANESTHESIA/SEDATION: General  CONTRAST:  59mL OMNIPAQUE IOHEXOL 300 MG/ML  SOLN  COMPLICATIONS: None immediate  PROCEDURE: Informed consent was obtained from the patient following explanation of the procedure, risks, benefits and alternatives. The patient understands, agrees and consents for the procedure. All questions were addressed. A time out was performed.  Maximal barrier sterile technique utilized including caps, mask, sterile gowns, sterile gloves, large  sterile drape, hand hygiene, and Betadine.  Under sterile conditions and local anesthesia, ultrasound micropuncture right common femoral venous access performed. Images obtained for documentation. Over a Bentson guidewire, a 40 [place] TIPS sheath was advanced into the IVC at the level of the left renal vein. C2 catheter was utilized to access the left renal vein. Selective left renal venogram confirms patency of the left renal vein. A Rosen guidewire was advanced peripherally into the left renal lower pole branches. Parallel to the [facility] guidewire, a Kumpe catheter was utilized to select the left gastro renal shunt. Catheter was advanced into the left gastro renal shunt over a Glidewire. Selective venogram performed of the gastro renal shunt in a retrograde fashion. This confirms position in the gastro renal shunt. An Amplatz guidewire was coiled in the gastro renal shunt. Over the Amplatz guidewire, the occlusion balloon was advanced into the gastro renal shunt. The 10 French sheath was also advanced to the origin of the gastro renal shunt to secure position. Balloon occlusion retrograde venogram performed. Venograms were performed with contrast and with CO2.  Initial venogram demonstrates small inferior phrenic veins with collateral communication to the IVC.  The Lantern micro catheter was advanced over a phathom guidewire more peripherally into the gastro renal shunt. Repeat venogram confirms patency of the small inferior phrenic collaterals communicating to the IVC. The small collateral pathways were accessed with the micro catheter. Micro coil embolization was performed of the  dominant feeding venous channel to the collateral phrenic to IVC pathway. 2 5 mm x 30 mm POD micro coils were deployed within the collateral venous channel.  Micro catheter was retracted and re- advanced into the gastro renal shunt peripherally. Contrast injection performed with contrast and with CO2. This demonstrates preferential flow  in a retrograde fashion into the dominant gastric varices. From this position, sclerotherapy was performed with 3% STS, Lipiodol, and air mixture. A total of 20 cc of sclerotherapy mixture injected in a retrograde fashion. There was good dispersal into the gastric varices.  Micro catheter was retracted close to the occlusion balloon position. Contrast injection at this level demonstrates extravasation of contrast, suspect related to pressure from the occlusion balloon. From this position, 2 32 x 60 mm RUBY coils were deployed. Because of the venous extravasation/ injury at the occlusion balloon site, the occlusion balloon will be left gently inflated through the sheath in this position overnight.  Patient was extubated and awakened. He was stable for recovery in the PACU. Overall he tolerated the procedure well.  IMPRESSION: Successful balloon occlusion retrograde trans venous obliteration of the gastric varices.   Electronically Signed   By: Jerilynn Mages.  Shick M.D.   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

## 2014-08-18 LAB — TYPE AND SCREEN
ABO/RH(D): O POS
Antibody Screen: NEGATIVE
Unit division: 0
Unit division: 0
Unit division: 0
Unit division: 0
Unit division: 0
Unit division: 0

## 2014-08-18 LAB — COMPREHENSIVE METABOLIC PANEL
ALT: 59 U/L — ABNORMAL HIGH (ref 0–53)
ANION GAP: 8 (ref 5–15)
AST: 130 U/L — ABNORMAL HIGH (ref 0–37)
Albumin: 2 g/dL — ABNORMAL LOW (ref 3.5–5.2)
Alkaline Phosphatase: 50 U/L (ref 39–117)
BUN: 29 mg/dL — ABNORMAL HIGH (ref 6–23)
CALCIUM: 7.9 mg/dL — AB (ref 8.4–10.5)
CO2: 22 mmol/L (ref 19–32)
Chloride: 120 mmol/L — ABNORMAL HIGH (ref 96–112)
Creatinine, Ser: 2.47 mg/dL — ABNORMAL HIGH (ref 0.50–1.35)
GFR calc non Af Amer: 27 mL/min — ABNORMAL LOW (ref >90)
GFR, EST AFRICAN AMERICAN: 32 mL/min — AB (ref >90)
GLUCOSE: 100 mg/dL — AB (ref 70–99)
Potassium: 3.8 mmol/L (ref 3.5–5.1)
Sodium: 150 mmol/L — ABNORMAL HIGH (ref 135–145)
Total Bilirubin: 5 mg/dL — ABNORMAL HIGH (ref 0.3–1.2)
Total Protein: 5.5 g/dL — ABNORMAL LOW (ref 6.0–8.3)

## 2014-08-18 LAB — AMMONIA: AMMONIA: 31 umol/L (ref 11–32)

## 2014-08-18 LAB — PROTIME-INR
INR: 1.43 (ref 0.00–1.49)
Prothrombin Time: 17.6 seconds — ABNORMAL HIGH (ref 11.6–15.2)

## 2014-08-18 MED ORDER — DEXTROSE 5 % IV SOLN
INTRAVENOUS | Status: DC
  Administered 2014-08-18: 500 mL via INTRAVENOUS
  Administered 2014-08-19: 1000 mL via INTRAVENOUS
  Administered 2014-08-19: 12:00:00 via INTRAVENOUS

## 2014-08-18 NOTE — Progress Notes (Signed)
[place] TEAM 1 - Stepdown/ICU TEAM Progress Note  John [name] LKG:[id number] DOB: June 27, 1955 DOA: 08/14/2014 PCP: No PCP Per Patient  Admit HPI / Brief Narrative: John [name] is a 59 y.o. BM PMHx Alcoholism, Alcoholic Liver Cirrhosis, coagulopathy, Polysubstance Abuse, accelerated hypertension, thrombocytopenia, who was recently discharged from the hospitalist service on 07/28/2014 at which time he was treated for new onset ascites undergoing paracentesis with removal of 2 L of ascitic fluid.  Presents to the emergency department with complaints of hematemesis over the past 3 days as well as bright red blood per rectum. He was noted have several episodes of bloody/maroon colored liquid consistency stools in the emergency department. CBC showed a hemoglobin of 5.4 with hematocrit of 16 and platelet count of 98,000. He has an INR 1.61 with PT of 19.3. He was urgently typed and crossed and received his first unit of packed red blood cells while in the emergency department. Last blood pressure reading was 125/74 with a pulse of 80, satting 100% on 2 L up in oxygen via nasal cannula. Patient also complains of abdominal pain located in the epigastric region. He takes Aleve as well as aspirin on a daily basis, and tells me he has only had one beer since his last hospitalization. I spoke with Dr. Fuller Plan of GI from the emergency department.   HPI/Subjective: 4/ 21 A/O 3 (does not know why), negative SOB, negative CP. Negative N/V, negative abdominal pain    Assessment/Plan: Upper GI bleed.  -4/18 S/P Balloon-occluded Retrograde Transvenous Obliteration (BRTO) by GI with balloon removed today. -Patient to remain nothing by mouth except for sips with medication my: Explain this to the patient. -Hemoglobin stable -Continue Protonix drip and Octreotide drip per GI - 4/18 transfused 3 units PRBC    Acute blood loss anemia/gastric varices bleeding -See upper GI  bleed  Alcohol-induced liver cirrhosis with ascites -Last month paracentesis; 2 L fluid removed -Patient's abdomen becoming increasingly distended and hard will ultrasound in the a.m. in perform paracentesis if required.  Thrombocytopenia  -Most likely secondary to alcohol liver cirrhosis -Will avoid all heparin products, NSAIDs -Monitor closely   Acute on chronic renal failure (baseline 1.49 on 3/16)  -Trending down monitor closely -D5W 145ml/hr  Elevated transaminases. - AST 114 with ALT of 45. This ratio would be consistent with alcoholic hepatitis.   Chronic alcoholism. - Continue CIWA protocol (4/17 alcohol negative)  Hypertension.  -Hold all BP medication  Hypernatremia -D5W 167ml/hr    Code Status: FULL Family Communication: no family present at time of exam Disposition Plan: Per GI    Consultants: Dr.Malcolm T Fuller Plan (GI) Dr.Jaime Wagner (IR)   Procedure/Significant Events: 3/31 echocardiogram;Left ventricle: moderate concentric hypertrophy.-LVEF= 60% to 65%. -(grade 1 diastolic dysfunction).-  4/18 transfused 3 units PRBC 4/18 S/P Balloon-occluded Retrograde Transvenous Obliteration (BRTO)  Culture   Antibiotics: Ciprofloxacin 4/17>>   DVT prophylaxis: SCD   Devices    LINES / TUBES:  Double-lumen PICC 4/19>>    Continuous Infusions:    Objective: VITAL SIGNS: Temp: 99.4 F (37.4 C) (04/21 1200) Temp Source: Oral (04/21 1200) BP: 136/77 mmHg (04/21 1200) Pulse Rate: 84 (04/21 0950) SPO2; FIO2:   Intake/Output Summary (Last 24 hours) at 08/18/14 1507 Last data filed at 08/18/14 0900  Gross per 24 hour  Intake    230 ml  Output   2200 ml  Net  -1970 ml     Exam: General:  A/O 3 (does not know why), A however does answer some other  questions inappropriately,  follows all commands, No acute respiratory distress Lungs: rales diffusely without wheezes or crackles Cardiovascular: Regular rate and rhythm without murmur  gallop or rub normal S1 and S2 Abdomen: Epigastric/RLQ tenderness to palpation, increased distended, hard, bowel sounds positive, no rebound, no ascites, no appreciable mass, negative fluid wave Extremities: No significant cyanosis, clubbing, or edema bilateral lower extremities  Data Reviewed: Basic Metabolic Panel:  Recent Labs Lab 08/14/14 1233 08/15/14 0811 08/16/14 0400 08/17/14 1533 08/18/14 0600  NA 145 144 147* 148* 150*  K 5.6* 4.5 4.0 3.8 3.8  CL 116* 114* 119* 122* 120*  CO2 23 21 21 20 22   GLUCOSE 101* 131* 138* 139* 100*  BUN 46* 48* 38* 30* 29*  CREATININE 2.01* 2.51* 2.25* 2.35* 2.47*  CALCIUM 8.0* 8.0* 8.0* 7.6* 7.9*   Liver Function Tests:  Recent Labs Lab 08/14/14 1233 08/16/14 0400 08/17/14 1533 08/18/14 0600  AST 114* 200* 138* 130*  ALT 45 75* 61* 59*  ALKPHOS 51 50 49 50  BILITOT 1.6* 3.2* 3.8* 5.0*  PROT 5.4* 5.4* 5.2* 5.5*  ALBUMIN 1.9* 1.9* 1.9* 2.0*   No results for input(s): LIPASE, AMYLASE in the last 168 hours.  Recent Labs Lab 08/18/14 0613  AMMONIA 31   CBC:  Recent Labs Lab 08/14/14 1233 08/15/14 0811 08/15/14 2200 08/16/14 0400 08/17/14 1533 08/17/14 2210  WBC 10.1 8.7 6.7 7.7 10.1 10.6*  NEUTROABS 6.2  --   --  5.9  --   --   HGB 5.4* 8.2* 7.8* 8.1* 7.8* 8.0*  HCT 16.0* 23.1* 22.7* 23.7* 23.9* 24.1*  MCV 86.5 83.1 84.4 84.9 89.8 88.3  PLT 98* 72* 63* 62* 63* 63*   Cardiac Enzymes: No results for input(s): CKTOTAL, CKMB, CKMBINDEX, TROPONINI in the last 168 hours. BNP (last 3 results)  Recent Labs  06/24/14 0802  BNP 1168.5*    ProBNP (last 3 results) No results for input(s): PROBNP in the last 8760 hours.  CBG:  Recent Labs Lab 08/15/14 0003 08/17/14 2142  GLUCAP 113* 92    Recent Results (from the past 240 hour(s))  MRSA PCR Screening     Status: None   Collection Time: 08/15/14 12:24 AM  Result Value Ref Range Status   MRSA by PCR NEGATIVE NEGATIVE Final    Comment:        The GeneXpert MRSA  Assay (FDA approved for NASAL specimens only), is one component of a comprehensive MRSA colonization surveillance program. It is not intended to diagnose MRSA infection nor to guide or monitor treatment for MRSA infections.      Studies:  Recent x-ray studies have been reviewed in detail by the Attending Physician  Scheduled Meds:  Scheduled Meds: . sodium chloride   Intravenous Once  . antiseptic oral rinse  7 mL Mouth Rinse BID  . ciprofloxacin  400 mg Intravenous Q12H  . folic acid  1 mg Oral Daily  . furosemide  40 mg Intravenous Q12H  . lactulose  20 g Oral Daily  . LORazepam  0-4 mg Intravenous Q6H   Followed by  . LORazepam  0-4 mg Intravenous Q12H  . multivitamin with minerals  1 tablet Oral Daily  . phytonadione  10 mg Oral Daily  . sodium chloride  10-40 mL Intracatheter Q12H  . sodium chloride  3 mL Intravenous Q12H  . spironolactone  50 mg Oral Daily  . thiamine  100 mg Oral Daily   Or  . thiamine  100 mg Intravenous Daily  Time spent on care of this patient: 40 mins   WOODS, Geraldo Docker , MD  [facility] Office  [phone] Pager [phone]  On-Call/Text Page:      [email or web address]      password [email or web address]  If 7PM-7AM, please contact night-coverage [email or web address] Password [email or web address] 08/18/2014, 3:07 PM   LOS: 4 days   Care during the described time interval was provided by me .  I have reviewed this patient's available data, including medical history, events of note, physical examination, radiology studies and test results as part of my evaluation  Dia Crawford, MD [phone] Pager

## 2014-08-19 ENCOUNTER — Inpatient Hospital Stay (HOSPITAL_COMMUNITY): Payer: Medicaid Other

## 2014-08-19 DIAGNOSIS — E870 Hyperosmolality and hypernatremia: Secondary | ICD-10-CM | POA: Diagnosis present

## 2014-08-19 DIAGNOSIS — G9340 Encephalopathy, unspecified: Secondary | ICD-10-CM | POA: Diagnosis present

## 2014-08-19 DIAGNOSIS — K7031 Alcoholic cirrhosis of liver with ascites: Secondary | ICD-10-CM | POA: Diagnosis present

## 2014-08-19 DIAGNOSIS — R509 Fever, unspecified: Secondary | ICD-10-CM | POA: Diagnosis present

## 2014-08-19 LAB — CBC WITH DIFFERENTIAL/PLATELET
Basophils Absolute: 0 10*3/uL (ref 0.0–0.1)
Basophils Relative: 0 % (ref 0–1)
EOS PCT: 2 % (ref 0–5)
Eosinophils Absolute: 0.2 10*3/uL (ref 0.0–0.7)
HEMATOCRIT: 22.7 % — AB (ref 39.0–52.0)
Hemoglobin: 7.7 g/dL — ABNORMAL LOW (ref 13.0–17.0)
Lymphocytes Relative: 17 % (ref 12–46)
Lymphs Abs: 1.7 10*3/uL (ref 0.7–4.0)
MCH: 29.8 pg (ref 26.0–34.0)
MCHC: 33.9 g/dL (ref 30.0–36.0)
MCV: 88 fL (ref 78.0–100.0)
MONO ABS: 1.5 10*3/uL — AB (ref 0.1–1.0)
Monocytes Relative: 15 % — ABNORMAL HIGH (ref 3–12)
Neutro Abs: 6.6 10*3/uL (ref 1.7–7.7)
Neutrophils Relative %: 66 % (ref 43–77)
Platelets: 50 10*3/uL — ABNORMAL LOW (ref 150–400)
RBC: 2.58 MIL/uL — ABNORMAL LOW (ref 4.22–5.81)
RDW: 20.4 % — ABNORMAL HIGH (ref 11.5–15.5)
WBC: 10 10*3/uL (ref 4.0–10.5)

## 2014-08-19 LAB — PROTIME-INR
INR: 1.5 — AB (ref 0.00–1.49)
Prothrombin Time: 18.2 seconds — ABNORMAL HIGH (ref 11.6–15.2)

## 2014-08-19 LAB — TYPE AND SCREEN
ABO/RH(D): O POS
ANTIBODY SCREEN: NEGATIVE
Unit division: 0
Unit division: 0
Unit division: 0

## 2014-08-19 LAB — COMPREHENSIVE METABOLIC PANEL
ALK PHOS: 44 U/L (ref 39–117)
ALT: 48 U/L (ref 0–53)
AST: 97 U/L — ABNORMAL HIGH (ref 0–37)
Albumin: 1.8 g/dL — ABNORMAL LOW (ref 3.5–5.2)
Anion gap: 5 (ref 5–15)
BUN: 31 mg/dL — ABNORMAL HIGH (ref 6–23)
CO2: 24 mmol/L (ref 19–32)
Calcium: 7.6 mg/dL — ABNORMAL LOW (ref 8.4–10.5)
Chloride: 116 mmol/L — ABNORMAL HIGH (ref 96–112)
Creatinine, Ser: 2.41 mg/dL — ABNORMAL HIGH (ref 0.50–1.35)
GFR calc Af Amer: 33 mL/min — ABNORMAL LOW (ref >90)
GFR calc non Af Amer: 28 mL/min — ABNORMAL LOW (ref >90)
GLUCOSE: 117 mg/dL — AB (ref 70–99)
POTASSIUM: 3.5 mmol/L (ref 3.5–5.1)
SODIUM: 145 mmol/L (ref 135–145)
Total Bilirubin: 5.6 mg/dL — ABNORMAL HIGH (ref 0.3–1.2)
Total Protein: 5.7 g/dL — ABNORMAL LOW (ref 6.0–8.3)

## 2014-08-19 LAB — AMMONIA: Ammonia: 39 umol/L — ABNORMAL HIGH (ref 11–32)

## 2014-08-19 MED ORDER — CHLORHEXIDINE GLUCONATE CLOTH 2 % EX PADS
6.0000 | MEDICATED_PAD | Freq: Every day | CUTANEOUS | Status: DC
  Administered 2014-08-20 – 2014-08-23 (×4): 6 via TOPICAL

## 2014-08-19 MED ORDER — MUPIROCIN 2 % EX OINT: 1.0000 "application " | TOPICAL_OINTMENT | Freq: Two times a day (BID) | CUTANEOUS | Status: DC

## 2014-08-19 MED ORDER — LIDOCAINE HCL (PF) 1 % IJ SOLN
INTRAMUSCULAR | Status: AC
  Filled 2014-08-19: qty 10

## 2014-08-19 NOTE — Progress Notes (Signed)
08/19/2014 Patient was sedated this morning at 0800. Patient was arousable, but very sleepy Dr Sherral Hammers is aware patient did not receive oral morning medication today. [facility] RN.

## 2014-08-19 NOTE — Progress Notes (Addendum)
[place] TEAM 1 - Stepdown/ICU TEAM Progress Note  John [name] DOB: 1956/03/28 DOA: 08/14/2014 PCP: No PCP Per Patient  Admit HPI / Brief Narrative: John [name] is a 59 y.o. BM PMHx Alcoholism, Alcoholic Liver Cirrhosis, coagulopathy, Polysubstance Abuse, accelerated hypertension, thrombocytopenia, who was recently discharged from the hospitalist service on 07/28/2014 at which time he was treated for new onset ascites undergoing paracentesis with removal of 2 L of ascitic fluid.  Presents to the emergency department with complaints of hematemesis over the past 3 days as well as bright red blood per rectum. He was noted have several episodes of bloody/maroon colored liquid consistency stools in the emergency department. CBC showed a hemoglobin of 5.4 with hematocrit of 16 and platelet count of 98,000. He has an INR 1.61 with PT of 19.3. He was urgently typed and crossed and received his first unit of packed red blood cells while in the emergency department. Last blood pressure reading was 125/74 with a pulse of 80, satting 100% on 2 L up in oxygen via nasal cannula. Patient also complains of abdominal pain located in the epigastric region. He takes Aleve as well as aspirin on a daily basis, and tells me he has only had one beer since his last hospitalization. I spoke with Dr. Fuller Plan of GI from the emergency department.   HPI/Subjective: 4/ 22 A/O 0, would not communicate verbally, however did follow commands (review of Epic chart showed patient received morphine+ oxycodone+ Zofran during the night). Does not appear SOB. Patient spiked a fever this a.m. 38.1 c    Assessment/Plan: Upper GI bleed.  -4/18 S/P Balloon-occluded Retrograde Transvenous Obliteration (BRTO) by GI with balloon removed today. -Patient to remain nothing by mouth except for sips with medication my: Explain this to the patient. -Hemoglobin stable -Continue Protonix drip and  Octreotide drip per GI - 4/18 transfused 3 units PRBC    Acute blood loss anemia/gastric varices bleeding -See upper GI bleed  Alcohol-induced liver cirrhosis with ascites -Last month paracentesis; 2 L fluid removed -Patient's abdomen becoming increasingly distended and hard, will contact brother for consent for bedside paracentesis.  Thrombocytopenia  -Most likely secondary to alcohol liver cirrhosis -Will avoid all heparin products, NSAIDs -Trended slightly down today   Acute on chronic renal failure (baseline 1.49 on 3/16)  -Trending down monitor closely -Continue D5W 58ml/hr  Elevated transaminases. - AST 114 with ALT of 45. This ratio would be consistent with alcoholic hepatitis.   Chronic alcoholism. - Continue CIWA protocol (4/17 alcohol negative)  Hypertension.  -Hold all BP medication  Hypernatremia -Continue D5W 158ml/hr -Improved  Fever  -Patient spiked a fever of 38.1 c, increased lethargy will panculture patient -     Code Status: FULL Family Communication: no family present at time of exam Disposition Plan: Per GI    Consultants: Dr.Malcolm T Fuller Plan (GI) Dr.Jaime Wagner (IR)   Procedure/Significant Events: 3/31 echocardiogram;Left ventricle: moderate concentric hypertrophy.-LVEF= 60% to 65%. -(grade 1 diastolic dysfunction).-  4/18 transfused 3 units PRBC 4/18 S/P Balloon-occluded Retrograde Transvenous Obliteration (BRTO) 4/22 PCXR;Small left pleural effusion. Left perihilar and retrocardiac opacity may reflect atelectasis, pneumonia, or perhaps asymmetric edema   Culture   Antibiotics: Ciprofloxacin 4/17>> stopped 4/21   DVT prophylaxis: SCD   Devices    LINES / TUBES:  Double-lumen PICC 4/19>>    Continuous Infusions: . dextrose 1,000 mL (08/19/14 0002)    Objective: VITAL SIGNS: Temp: 97.8 F (36.6 C) (04/22 0808) Temp Source: Oral (04/22 0808) BP: 152/75 mmHg (  04/22 0808) Pulse Rate: 79 (04/22  0808) SPO2; FIO2:   Intake/Output Summary (Last 24 hours) at 08/19/14 1154 Last data filed at 08/19/14 0809  Gross per 24 hour  Intake 221.33 ml  Output   2650 ml  Net -2428.67 ml     Exam: General:  Lethargic, will not verbalize answers, does follow commands, No acute respiratory distress Lungs:  decreased breath sounds diffusely,without wheezes or crackles Cardiovascular: Regular rate and rhythm without murmur gallop or rub normal S1 and S2 Abdomen: Epigastric/RLQ tenderness to palpation, increased distended, hard, bowel sounds positive, no rebound, no ascites, no appreciable mass, negative fluid wave Extremities: No significant cyanosis, clubbing, or edema bilateral lower extremities  Data Reviewed: Basic Metabolic Panel:  Recent Labs Lab 08/15/14 0811 08/16/14 0400 08/17/14 1533 08/18/14 0600 08/19/14 0450  NA 144 147* 148* 150* 145  K 4.5 4.0 3.8 3.8 3.5  CL 114* 119* 122* 120* 116*  CO2 21 21 20 22 24   GLUCOSE 131* 138* 139* 100* 117*  BUN 48* 38* 30* 29* 31*  CREATININE 2.51* 2.25* 2.35* 2.47* 2.41*  CALCIUM 8.0* 8.0* 7.6* 7.9* 7.6*   Liver Function Tests:  Recent Labs Lab 08/14/14 1233 08/16/14 0400 08/17/14 1533 08/18/14 0600 08/19/14 0450  AST 114* 200* 138* 130* 97*  ALT 45 75* 61* 59* 48  ALKPHOS 51 50 49 50 44  BILITOT 1.6* 3.2* 3.8* 5.0* 5.6*  PROT 5.4* 5.4* 5.2* 5.5* 5.7*  ALBUMIN 1.9* 1.9* 1.9* 2.0* 1.8*   No results for input(s): LIPASE, AMYLASE in the last 168 hours.  Recent Labs Lab 08/18/14 0613 08/19/14 0500  AMMONIA 31 39*   CBC:  Recent Labs Lab 08/14/14 1233  08/15/14 2200 08/16/14 0400 08/17/14 1533 08/17/14 2210 08/19/14 0450  WBC 10.1  < > 6.7 7.7 10.1 10.6* 10.0  NEUTROABS 6.2  --   --  5.9  --   --  6.6  HGB 5.4*  < > 7.8* 8.1* 7.8* 8.0* 7.7*  HCT 16.0*  < > 22.7* 23.7* 23.9* 24.1* 22.7*  MCV 86.5  < > 84.4 84.9 89.8 88.3 88.0  PLT 98*  < > 63* 62* 63* 63* 50*  < > = values in this interval not  displayed. Cardiac Enzymes: No results for input(s): CKTOTAL, CKMB, CKMBINDEX, TROPONINI in the last 168 hours. BNP (last 3 results)  Recent Labs  06/24/14 0802  BNP 1168.5*    ProBNP (last 3 results) No results for input(s): PROBNP in the last 8760 hours.  CBG:  Recent Labs Lab 08/15/14 0003 08/17/14 2142  GLUCAP 113* 92    Recent Results (from the past 240 hour(s))  MRSA PCR Screening     Status: None   Collection Time: 08/15/14 12:24 AM  Result Value Ref Range Status   MRSA by PCR NEGATIVE NEGATIVE Final    Comment:        The GeneXpert MRSA Assay (FDA approved for NASAL specimens only), is one component of a comprehensive MRSA colonization surveillance program. It is not intended to diagnose MRSA infection nor to guide or monitor treatment for MRSA infections.      Studies:  Recent x-ray studies have been reviewed in detail by the Attending Physician  Scheduled Meds:  Scheduled Meds: . sodium chloride   Intravenous Once  . antiseptic oral rinse  7 mL Mouth Rinse BID  . Chlorhexidine Gluconate Cloth  6 each Topical Q0600  . folic acid  1 mg Oral Daily  . furosemide  40 mg Intravenous  Q12H  . lactulose  20 g Oral Daily  . LORazepam  0-4 mg Intravenous Q12H  . multivitamin with minerals  1 tablet Oral Daily  . phytonadione  10 mg Oral Daily  . sodium chloride  10-40 mL Intracatheter Q12H  . sodium chloride  3 mL Intravenous Q12H  . spironolactone  50 mg Oral Daily  . thiamine  100 mg Oral Daily   Or  . thiamine  100 mg Intravenous Daily    Time spent on care of this patient: 40 mins   WOODS, Geraldo Docker , MD  [facility] Office  [phone] Pager - [phone]  On-Call/Text Page:      [email or web address]      password [email or web address]  If 7PM-7AM, please contact night-coverage [email or web address] Password [facility] 08/19/2014, 11:54 AM   LOS: 5 days   Care during the described time interval was provided by me .  I have reviewed this patient's available data,  including medical history, events of note, physical examination, radiology studies and test results as part of my evaluation  Dia Crawford, MD [phone] Pager

## 2014-08-19 NOTE — Progress Notes (Signed)
08/19/2014 Central monitor called at 1735, that patient had  runs of SVT at 1642. Rn made Dr Sherral Hammers aware at [phone]. Continue to monitor. Rn on first shift let third shift nurse aware. [facility] Rn.

## 2014-08-20 ENCOUNTER — Inpatient Hospital Stay (HOSPITAL_COMMUNITY): Payer: Medicaid Other

## 2014-08-20 DIAGNOSIS — J811 Chronic pulmonary edema: Secondary | ICD-10-CM | POA: Diagnosis present

## 2014-08-20 DIAGNOSIS — J810 Acute pulmonary edema: Secondary | ICD-10-CM

## 2014-08-20 LAB — BODY FLUID CELL COUNT WITH DIFFERENTIAL
Eos, Fluid: 0 %
LYMPHS FL: 1 %
MONOCYTE-MACROPHAGE-SEROUS FLUID: 24 % — AB (ref 50–90)
Neutrophil Count, Fluid: 53 % — ABNORMAL HIGH (ref 0–25)
Other Cells, Fluid: 22 %
WBC FLUID: 428 uL (ref 0–1000)

## 2014-08-20 LAB — CBC WITH DIFFERENTIAL/PLATELET
BASOS PCT: 0 % (ref 0–1)
Basophils Absolute: 0 10*3/uL (ref 0.0–0.1)
Eosinophils Absolute: 0.1 10*3/uL (ref 0.0–0.7)
Eosinophils Relative: 1 % (ref 0–5)
HEMATOCRIT: 21.8 % — AB (ref 39.0–52.0)
Hemoglobin: 7.3 g/dL — ABNORMAL LOW (ref 13.0–17.0)
LYMPHS ABS: 1.5 10*3/uL (ref 0.7–4.0)
LYMPHS PCT: 16 % (ref 12–46)
MCH: 29.1 pg (ref 26.0–34.0)
MCHC: 33.5 g/dL (ref 30.0–36.0)
MCV: 86.9 fL (ref 78.0–100.0)
MONO ABS: 1.8 10*3/uL — AB (ref 0.1–1.0)
MONOS PCT: 19 % — AB (ref 3–12)
NEUTROS ABS: 6.2 10*3/uL (ref 1.7–7.7)
Neutrophils Relative %: 64 % (ref 43–77)
Platelets: 56 10*3/uL — ABNORMAL LOW (ref 150–400)
RBC: 2.51 MIL/uL — AB (ref 4.22–5.81)
RDW: 19.8 % — ABNORMAL HIGH (ref 11.5–15.5)
WBC: 9.6 10*3/uL (ref 4.0–10.5)

## 2014-08-20 LAB — PROTEIN, BODY FLUID: Total protein, fluid: <3 g/dL

## 2014-08-20 LAB — COMPREHENSIVE METABOLIC PANEL
ALBUMIN: 1.6 g/dL — AB (ref 3.5–5.2)
ALK PHOS: 45 U/L (ref 39–117)
ALT: 41 U/L (ref 0–53)
AST: 88 U/L — ABNORMAL HIGH (ref 0–37)
Anion gap: 4 — ABNORMAL LOW (ref 5–15)
BUN: 29 mg/dL — ABNORMAL HIGH (ref 6–23)
CO2: 23 mmol/L (ref 19–32)
Calcium: 7.2 mg/dL — ABNORMAL LOW (ref 8.4–10.5)
Chloride: 106 mmol/L (ref 96–112)
Creatinine, Ser: 2.3 mg/dL — ABNORMAL HIGH (ref 0.50–1.35)
GFR calc Af Amer: 35 mL/min — ABNORMAL LOW (ref >90)
GFR calc non Af Amer: 30 mL/min — ABNORMAL LOW (ref >90)
Glucose, Bld: 247 mg/dL — ABNORMAL HIGH (ref 70–99)
Potassium: 3.5 mmol/L (ref 3.5–5.1)
Sodium: 133 mmol/L — ABNORMAL LOW (ref 135–145)
Total Bilirubin: 7 mg/dL — ABNORMAL HIGH (ref 0.3–1.2)
Total Protein: 5.2 g/dL — ABNORMAL LOW (ref 6.0–8.3)

## 2014-08-20 LAB — GLUCOSE, CAPILLARY: GLUCOSE-CAPILLARY: 93 mg/dL (ref 70–99)

## 2014-08-20 LAB — ALBUMIN, FLUID (OTHER)

## 2014-08-20 LAB — AMYLASE, PERITONEAL FLUID: Amylase, peritoneal fluid: 31 U/L

## 2014-08-20 LAB — LACTATE DEHYDROGENASE, PLEURAL OR PERITONEAL FLUID: LD, Fluid: 82 U/L — ABNORMAL HIGH (ref 3–23)

## 2014-08-20 MED ORDER — DEXTROSE-NACL 5-0.9 % IV SOLN
INTRAVENOUS | Status: DC
  Administered 2014-08-20: 19:00:00 via INTRAVENOUS
  Administered 2014-08-21: 50 mL/h via INTRAVENOUS
  Administered 2014-08-22: 10:00:00 via INTRAVENOUS

## 2014-08-20 MED ORDER — LIDOCAINE HCL (PF) 1 % IJ SOLN
INTRAMUSCULAR | Status: AC
  Filled 2014-08-20: qty 10

## 2014-08-20 MED ORDER — ACETAMINOPHEN 325 MG PO TABS
650.0000 mg | ORAL_TABLET | Freq: Once | ORAL | Status: AC
  Administered 2014-08-20: 650 mg via ORAL
  Filled 2014-08-20: qty 2

## 2014-08-20 NOTE — Progress Notes (Signed)
08/20/2014 Central monitor called at 1700. Patient had a 3 beat run of v-tach at 1400. Dr would was made aware through [facility]. Continue to monitor. [facility] RN.

## 2014-08-20 NOTE — Progress Notes (Signed)
[place] TEAM 1 - Stepdown/ICU TEAM Progress Note  John [name] HQI:[id number] DOB: 1956-02-18 DOA: 08/14/2014 PCP: No PCP Per Patient  Admit HPI / Brief Narrative: John [name] is a 59 y.o. BM PMHx Alcoholism, Alcoholic Liver Cirrhosis, coagulopathy, Polysubstance Abuse, accelerated hypertension, thrombocytopenia, who was recently discharged from the hospitalist service on 07/28/2014 at which time he was treated for new onset ascites undergoing paracentesis with removal of 2 L of ascitic fluid.  Presents to the emergency department with complaints of hematemesis over the past 3 days as well as bright red blood per rectum. He was noted have several episodes of bloody/maroon colored liquid consistency stools in the emergency department. CBC showed a hemoglobin of 5.4 with hematocrit of 16 and platelet count of 98,000. He has an INR 1.61 with PT of 19.3. He was urgently typed and crossed and received his first unit of packed red blood cells while in the emergency department. Last blood pressure reading was 125/74 with a pulse of 80, satting 100% on 2 L up in oxygen via nasal cannula. Patient also complains of abdominal pain located in the epigastric region. He takes Aleve as well as aspirin on a daily basis, and tells me he has only had one beer since his last hospitalization. I spoke with Dr. Fuller Plan of GI from the emergency department.   HPI/Subjective: 4/ 23 A/O 4, request to have diet change to regular food   Assessment/Plan: Upper GI bleed.  -4/18 S/P Balloon-occluded Retrograde Transvenous Obliteration (BRTO) by GI with balloon removed today. -Patient to remain nothing by mouth except for sips with medication my: Explain this to the patient. -Hemoglobin stable -Continue Protonix drip and Octreotide drip per GI - 4/18 transfused 3 units PRBC    Acute blood loss anemia/gastric varices bleeding -See upper GI bleed  Alcohol-induced liver cirrhosis with  ascites -Last month paracentesis; 2 L fluid removed -Patient's abdomen still distended softer post paracentesis. -4/23 US guided paracentesis removed 1.7 L; fluid sent for stain, culture, and cytology  Thrombocytopenia  -Most likely secondary to alcohol liver cirrhosis -Will avoid all heparin products, NSAIDs -Trended slightly up today   Acute on chronic renal failure (baseline 1.49 on 3/16)  -Trending down monitor closely -Continue D5W-0.9% normal saline 32ml/hr  Elevated transaminases. - AST 114 with ALT of 45. This ratio would be consistent with alcoholic hepatitis.   Chronic alcoholism. - Continue CIWA protocol (4/17 alcohol negative)  Hypertension.  -Hold all BP medication  Hypernatremia -Continue D5W-0.9% normal saline 68ml/hr -Improved  Fever  -Patient spiked a fever of 38.1 c, increased lethargy will panculture patient -  Metabolic encephalopathy -Clearing, speech eval for study; will advance diet once patient passes swallow study   Code Status: FULL Family Communication: no family present at time of exam Disposition Plan: Per GI    Consultants: Dr.Malcolm T Fuller Plan (GI) Dr.Jaime Wagner (IR)   Procedure/Significant Events: 3/31 echocardiogram;Left ventricle: moderate concentric hypertrophy.-LVEF= 60% to 65%. -(grade 1 diastolic dysfunction).-  4/18 transfused 3 units PRBC 4/18 S/P Balloon-occluded Retrograde Transvenous Obliteration (BRTO) 4/22 PCXR;Small left pleural effusion. Left perihilar and retrocardiac opacity may reflect atelectasis, pneumonia, or perhaps asymmetric edema 4/23 US guided paracentesis removed 1.7 L  Culture   Antibiotics: Ciprofloxacin 4/17>> stopped 4/21   DVT prophylaxis: SCD   Devices    LINES / TUBES:  Double-lumen PICC 4/19>>    Continuous Infusions: . dextrose 5 % and 0.9% NaCl      Objective: VITAL SIGNS: Temp: 98.5 F (36.9 C) (04/23 1549) Temp  Source: Oral (04/23 1549) BP: 127/77 mmHg (04/23  1549) Pulse Rate: 70 (04/23 1549) SPO2; FIO2:   Intake/Output Summary (Last 24 hours) at 08/20/14 1807 Last data filed at 08/20/14 1640  Gross per 24 hour  Intake    650 ml  Output   2150 ml  Net  -1500 ml     Exam: General: A/O 4, follow commands, No acute respiratory distress Lungs:  air movement in all lobes, mild rhonchi ,without wheezes or crackles Cardiovascular: Regular rate and rhythm without murmur gallop or rub normal S1 and S2 Abdomen: Epigastric/RLQ tenderness to palpation, distended, firm, bowel sounds positive, no rebound, no ascites, no appreciable mass, negative fluid wave Extremities: No significant cyanosis, clubbing, or edema bilateral lower extremities  Data Reviewed: Basic Metabolic Panel:  Recent Labs Lab 08/16/14 0400 08/17/14 1533 08/18/14 0600 08/19/14 0450 08/20/14 0500  NA 147* 148* 150* 145 133*  K 4.0 3.8 3.8 3.5 3.5  CL 119* 122* 120* 116* 106  CO2 21 20 22 24 23   GLUCOSE 138* 139* 100* 117* 247*  BUN 38* 30* 29* 31* 29*  CREATININE 2.25* 2.35* 2.47* 2.41* 2.30*  CALCIUM 8.0* 7.6* 7.9* 7.6* 7.2*   Liver Function Tests:  Recent Labs Lab 08/16/14 0400 08/17/14 1533 08/18/14 0600 08/19/14 0450 08/20/14 0500  AST 200* 138* 130* 97* 88*  ALT 75* 61* 59* 48 41  ALKPHOS 50 49 50 44 45  BILITOT 3.2* 3.8* 5.0* 5.6* 7.0*  PROT 5.4* 5.2* 5.5* 5.7* 5.2*  ALBUMIN 1.9* 1.9* 2.0* 1.8* 1.6*   No results for input(s): LIPASE, AMYLASE in the last 168 hours.  Recent Labs Lab 08/18/14 0613 08/19/14 0500  AMMONIA 31 39*   CBC:  Recent Labs Lab 08/14/14 1233  08/16/14 0400 08/17/14 1533 08/17/14 2210 08/19/14 0450 08/20/14 0500  WBC 10.1  < > 7.7 10.1 10.6* 10.0 9.6  NEUTROABS 6.2  --  5.9  --   --  6.6 6.2  HGB 5.4*  < > 8.1* 7.8* 8.0* 7.7* 7.3*  HCT 16.0*  < > 23.7* 23.9* 24.1* 22.7* 21.8*  MCV 86.5  < > 84.9 89.8 88.3 88.0 86.9  PLT 98*  < > 62* 63* 63* 50* 56*  < > = values in this interval not displayed. Cardiac  Enzymes: No results for input(s): CKTOTAL, CKMB, CKMBINDEX, TROPONINI in the last 168 hours. BNP (last 3 results)  Recent Labs  06/24/14 0802  BNP 1168.5*    ProBNP (last 3 results) No results for input(s): PROBNP in the last 8760 hours.  CBG:  Recent Labs Lab 08/15/14 0003 08/17/14 2142 08/20/14 0833  GLUCAP 113* 92 93    Recent Results (from the past 240 hour(s))  MRSA PCR Screening     Status: None   Collection Time: 08/15/14 12:24 AM  Result Value Ref Range Status   MRSA by PCR NEGATIVE NEGATIVE Final    Comment:        The GeneXpert MRSA Assay (FDA approved for NASAL specimens only), is one component of a comprehensive MRSA colonization surveillance program. It is not intended to diagnose MRSA infection nor to guide or monitor treatment for MRSA infections.   Culture, blood (routine x 2)     Status: None (Preliminary result)   Collection Time: 08/19/14  1:23 PM  Result Value Ref Range Status   Specimen Description BLOOD LEFT HAND  Final   Special Requests BOTTLES DRAWN AEROBIC AND ANAEROBIC 10CC 5CC  Final   Culture   Final  BLOOD CULTURE RECEIVED NO GROWTH TO DATE CULTURE WILL BE HELD FOR 5 DAYS BEFORE ISSUING A FINAL NEGATIVE REPORT Performed at [facility]    Report Status PENDING  Incomplete     Studies:  Recent x-ray studies have been reviewed in detail by the Attending Physician  Scheduled Meds:  Scheduled Meds: . sodium chloride   Intravenous Once  . antiseptic oral rinse  7 mL Mouth Rinse BID  . Chlorhexidine Gluconate Cloth  6 each Topical Q0600  . folic acid  1 mg Oral Daily  . furosemide  40 mg Intravenous Q12H  . lactulose  20 g Oral Daily  . lidocaine (PF)      . LORazepam  0-4 mg Intravenous Q12H  . multivitamin with minerals  1 tablet Oral Daily  . phytonadione  10 mg Oral Daily  . sodium chloride  10-40 mL Intracatheter Q12H  . sodium chloride  3 mL Intravenous Q12H  . spironolactone  50 mg Oral Daily  .  thiamine  100 mg Oral Daily   Or  . thiamine  100 mg Intravenous Daily    Time spent on care of this patient: 40 mins   John [name], John [name] , MD  [facility] Office  [phone] Pager - [phone]  On-Call/Text Page:      [email or web address]      password [email or web address]  If 7PM-7AM, please contact night-coverage [email or web address] Password [email or web address] 08/20/2014, 6:07 PM   LOS: 6 days   Care during the described time interval was provided by me .  I have reviewed this patient's available data, including medical history, events of note, physical examination, radiology studies and test results as part of my evaluation  Dia Crawford, MD [phone] Pager

## 2014-08-21 ENCOUNTER — Inpatient Hospital Stay (HOSPITAL_COMMUNITY): Payer: Medicaid Other

## 2014-08-21 DIAGNOSIS — R4182 Altered mental status, unspecified: Secondary | ICD-10-CM | POA: Diagnosis present

## 2014-08-21 DIAGNOSIS — R400 Somnolence: Secondary | ICD-10-CM

## 2014-08-21 DIAGNOSIS — G9341 Metabolic encephalopathy: Secondary | ICD-10-CM | POA: Diagnosis present

## 2014-08-21 LAB — COMPREHENSIVE METABOLIC PANEL
ALBUMIN: 1.6 g/dL — AB (ref 3.5–5.2)
ALT: 42 U/L (ref 0–53)
AST: 114 U/L — ABNORMAL HIGH (ref 0–37)
Alkaline Phosphatase: 55 U/L (ref 39–117)
Anion gap: 5 (ref 5–15)
BUN: 29 mg/dL — ABNORMAL HIGH (ref 6–23)
CALCIUM: 7.4 mg/dL — AB (ref 8.4–10.5)
CO2: 25 mmol/L (ref 19–32)
Chloride: 108 mmol/L (ref 96–112)
Creatinine, Ser: 2.11 mg/dL — ABNORMAL HIGH (ref 0.50–1.35)
GFR calc Af Amer: 38 mL/min — ABNORMAL LOW (ref >90)
GFR calc non Af Amer: 33 mL/min — ABNORMAL LOW (ref >90)
Glucose, Bld: 103 mg/dL — ABNORMAL HIGH (ref 70–99)
Potassium: 3.6 mmol/L (ref 3.5–5.1)
Sodium: 138 mmol/L (ref 135–145)
Total Bilirubin: 8.7 mg/dL — ABNORMAL HIGH (ref 0.3–1.2)
Total Protein: 5.6 g/dL — ABNORMAL LOW (ref 6.0–8.3)

## 2014-08-21 LAB — CBC WITH DIFFERENTIAL/PLATELET
BASOS ABS: 0 10*3/uL (ref 0.0–0.1)
BASOS PCT: 0 % (ref 0–1)
EOS PCT: 1 % (ref 0–5)
Eosinophils Absolute: 0.1 10*3/uL (ref 0.0–0.7)
HCT: 23.1 % — ABNORMAL LOW (ref 39.0–52.0)
HEMOGLOBIN: 8 g/dL — AB (ref 13.0–17.0)
LYMPHS PCT: 14 % (ref 12–46)
Lymphs Abs: 1.4 10*3/uL (ref 0.7–4.0)
MCH: 29.6 pg (ref 26.0–34.0)
MCHC: 34.6 g/dL (ref 30.0–36.0)
MCV: 85.6 fL (ref 78.0–100.0)
MONOS PCT: 17 % — AB (ref 3–12)
Monocytes Absolute: 1.7 10*3/uL — ABNORMAL HIGH (ref 0.1–1.0)
NEUTROS ABS: 6.8 10*3/uL (ref 1.7–7.7)
NEUTROS PCT: 69 % (ref 43–77)
Platelets: 75 10*3/uL — ABNORMAL LOW (ref 150–400)
RBC: 2.7 MIL/uL — AB (ref 4.22–5.81)
RDW: 19.4 % — AB (ref 11.5–15.5)
WBC: 10.4 10*3/uL (ref 4.0–10.5)

## 2014-08-21 LAB — URINE CULTURE
COLONY COUNT: NO GROWTH
CULTURE: NO GROWTH

## 2014-08-21 LAB — AMMONIA: Ammonia: 49 umol/L — ABNORMAL HIGH (ref 11–32)

## 2014-08-21 NOTE — Progress Notes (Signed)
[place] TEAM 1 - Stepdown/ICU TEAM Progress Note  Drury Ardizzone STM:[id number] DOB: [birth year] DOA: 08/14/2014 PCP: No PCP Per Patient  Admit HPI / Brief Narrative: John [name] is a 59 y.o. BM PMHx Alcoholism, Alcoholic Liver Cirrhosis, coagulopathy, Polysubstance Abuse, accelerated hypertension, thrombocytopenia, who was recently discharged from the hospitalist service on 07/28/2014 at which time he was treated for new onset ascites undergoing paracentesis with removal of 2 L of ascitic fluid.  Presents to the emergency department with complaints of hematemesis over the past 3 days as well as bright red blood per rectum. He was noted have several episodes of bloody/maroon colored liquid consistency stools in the emergency department. CBC showed a hemoglobin of 5.4 with hematocrit of 16 and platelet count of 98,000. He has an INR 1.61 with PT of 19.3. He was urgently typed and crossed and received his first unit of packed red blood cells while in the emergency department. Last blood pressure reading was 125/74 with a pulse of 80, satting 100% on 2 L up in oxygen via nasal cannula. Patient also complains of abdominal pain located in the epigastric region. He takes Aleve as well as aspirin on a daily basis, and tells me he has only had one beer since his last hospitalization. I spoke with Dr. Fuller Plan of GI from the emergency department.   HPI/Subjective: 4/ 24 lethargic difficulty arousing A/O  2 (does not know when, why)    Assessment/Plan: Upper GI bleed.  -4/18 S/P Balloon-occluded Retrograde Transvenous Obliteration (BRTO) by GI with balloon removed today. -Patient to remain nothing by mouth except for sips with medication my: Explain this to the patient. -Hemoglobin stable -Continue Protonix drip and Octreotide drip per GI - 4/18 transfused 3 units PRBC   -4/24 stable  Acute blood loss anemia/gastric varices bleeding -See upper GI bleed  Alcohol-induced  liver cirrhosis with ascites -Last month paracentesis; 2 L fluid removed -Patient's abdomen still distended softer post paracentesis. -4/23 US guided paracentesis removed 1.7 L; fluid sent for stain, culture, and cytology  Thrombocytopenia  -Most likely secondary to alcohol liver cirrhosis -Will avoid all heparin products, NSAIDs -Trended slightly up today   Acute on chronic renal failure (baseline 1.49 on 3/16)  -Trending down monitor closely -Continue D5W-0.9% normal saline 76ml/hr  Elevated transaminases. - AST 114 with ALT of 45. This ratio would be consistent with alcoholic hepatitis.   Chronic alcoholism. - Continue CIWA protocol (4/17 alcohol negative)  Hypertension.  -Lasix 40 mg BID restarted secondary to patient's alcoholic liver cirrhosis -Spironolactone 50 mg restarted secondary to patient's alcohol liver cirrhosis  Hypernatremia -Continue D5W-0.9% normal saline 34ml/hr -Improved  Fever  -4/24 Patient spiked a fever of 38.1 c 2 overnight, ; panculture, obtain PCXR  -  Metabolic encephalopathy -Clearing, speech eval for study; will advance diet once patient passes swallow study -See fever -Have discontinued all sedating medication to include morphine, Zofran, oxycodone -Out of bed to chair q shift    Code Status: FULL Family Communication: no family present at time of exam Disposition Plan: Per GI    Consultants: Dr.Malcolm T Fuller Plan (GI) Dr.Jaime Wagner (IR)   Procedure/Significant Events: 3/31 echocardiogram;Left ventricle: moderate concentric hypertrophy.-LVEF= 60% to 65%. -(grade 1 diastolic dysfunction).-  4/18 transfused 3 units PRBC 4/18 S/P Balloon-occluded Retrograde Transvenous Obliteration (BRTO) 4/22 PCXR;Small left pleural effusion. Left perihilar and retrocardiac opacity may reflect atelectasis, pneumonia, or perhaps asymmetric edema 4/23 US guided paracentesis removed 1.7 L  Culture 4/18 MRSA by PCR negative 4/22 blood left hand  2  NGTD 4/22 urine negative 4/23 peritoneal fluid pending   Antibiotics: Ciprofloxacin 4/17>> stopped 4/21   DVT prophylaxis: SCD   Devices    LINES / TUBES:  Double-lumen PICC 4/19>>    Continuous Infusions: . dextrose 5 % and 0.9% NaCl 50 mL/hr at 08/20/14 1848    Objective: VITAL SIGNS: Temp: 99.5 F (37.5 C) (04/24 0848) Temp Source: Axillary (04/24 0848) BP: 117/65 mmHg (04/24 0848) Pulse Rate: 75 (04/24 0848) SPO2; FIO2:   Intake/Output Summary (Last 24 hours) at 08/21/14 1053 Last data filed at 08/21/14 0740  Gross per 24 hour  Intake   1470 ml  Output   2000 ml  Net   -530 ml     Exam: General: Lethargic, sleepy difficult to arouse A/O 2 (does not know when, why), follow some commands, No acute respiratory distress Lungs:  air movement in all lobes, mild rhonchi ,without wheezes or crackles Cardiovascular: Regular rate and rhythm without murmur gallop or rub normal S1 and S2 Abdomen: Nontender to palpation, distended, firm, bowel sounds positive, no rebound, no ascites, no appreciable mass, negative fluid wave Extremities: No significant cyanosis, clubbing, or edema bilateral lower extremities  Data Reviewed: Basic Metabolic Panel:  Recent Labs Lab 08/17/14 1533 08/18/14 0600 08/19/14 0450 08/20/14 0500 08/21/14 0548  NA 148* 150* 145 133* 138  K 3.8 3.8 3.5 3.5 3.6  CL 122* 120* 116* 106 108  CO2 20 22 24 23 25   GLUCOSE 139* 100* 117* 247* 103*  BUN 30* 29* 31* 29* 29*  CREATININE 2.35* 2.47* 2.41* 2.30* 2.11*  CALCIUM 7.6* 7.9* 7.6* 7.2* 7.4*   Liver Function Tests:  Recent Labs Lab 08/17/14 1533 08/18/14 0600 08/19/14 0450 08/20/14 0500 08/21/14 0548  AST 138* 130* 97* 88* 114*  ALT 61* 59* 48 41 42  ALKPHOS 49 50 44 45 55  BILITOT 3.8* 5.0* 5.6* 7.0* 8.7*  PROT 5.2* 5.5* 5.7* 5.2* 5.6*  ALBUMIN 1.9* 2.0* 1.8* 1.6* 1.6*   No results for input(s): LIPASE, AMYLASE in the last 168 hours.  Recent Labs Lab 08/18/14 0613  08/19/14 0500  AMMONIA 31 39*   CBC:  Recent Labs Lab 08/14/14 1233  08/16/14 0400 08/17/14 1533 08/17/14 2210 08/19/14 0450 08/20/14 0500 08/21/14 0548  WBC 10.1  < > 7.7 10.1 10.6* 10.0 9.6 10.4  NEUTROABS 6.2  --  5.9  --   --  6.6 6.2 6.8  HGB 5.4*  < > 8.1* 7.8* 8.0* 7.7* 7.3* 8.0*  HCT 16.0*  < > 23.7* 23.9* 24.1* 22.7* 21.8* 23.1*  MCV 86.5  < > 84.9 89.8 88.3 88.0 86.9 85.6  PLT 98*  < > 62* 63* 63* 50* 56* 75*  < > = values in this interval not displayed. Cardiac Enzymes: No results for input(s): CKTOTAL, CKMB, CKMBINDEX, TROPONINI in the last 168 hours. BNP (last 3 results)  Recent Labs  06/24/14 0802  BNP 1168.5*    ProBNP (last 3 results) No results for input(s): PROBNP in the last 8760 hours.  CBG:  Recent Labs Lab 08/15/14 0003 08/17/14 2142 08/20/14 0833  GLUCAP 113* 92 93    Recent Results (from the past 240 hour(s))  MRSA PCR Screening     Status: None   Collection Time: 08/15/14 12:24 AM  Result Value Ref Range Status   MRSA by PCR NEGATIVE NEGATIVE Final    Comment:        The GeneXpert MRSA Assay (FDA approved for NASAL specimens only), is one component  of a comprehensive MRSA colonization surveillance program. It is not intended to diagnose MRSA infection nor to guide or monitor treatment for MRSA infections.   Culture, blood (routine x 2)     Status: None (Preliminary result)   Collection Time: 08/19/14  1:23 PM  Result Value Ref Range Status   Specimen Description BLOOD LEFT HAND  Final   Special Requests BOTTLES DRAWN AEROBIC AND ANAEROBIC 10CC 5CC  Final   Culture   Final           BLOOD CULTURE RECEIVED NO GROWTH TO DATE CULTURE WILL BE HELD FOR 5 DAYS BEFORE ISSUING A FINAL NEGATIVE REPORT Performed at [facility]    Report Status PENDING  Incomplete  Culture, blood (routine x 2)     Status: None (Preliminary result)   Collection Time: 08/19/14  1:30 PM  Result Value Ref Range Status   Specimen Description  BLOOD LEFT HAND  Final   Special Requests BOTTLES DRAWN AEROBIC AND ANAEROBIC 10CC 5CC  Final   Culture   Final           BLOOD CULTURE RECEIVED NO GROWTH TO DATE CULTURE WILL BE HELD FOR 5 DAYS BEFORE ISSUING A FINAL NEGATIVE REPORT Performed at [facility]    Report Status PENDING  Incomplete  Culture, Urine     Status: None   Collection Time: 08/19/14  8:58 PM  Result Value Ref Range Status   Specimen Description URINE, CATHETERIZED  Final   Special Requests NONE  Final   Colony Count NO GROWTH Performed at [facility]   Final   Culture NO GROWTH Performed at [facility]   Final   Report Status 08/21/2014 FINAL  Final  Anaerobic culture     Status: None (Preliminary result)   Collection Time: 08/20/14 10:41 AM  Result Value Ref Range Status   Specimen Description PERITONEAL CAVITY  Final   Special Requests NONE  Final   Gram Stain PENDING  Incomplete   Culture   Final    NO ANAEROBES ISOLATED; CULTURE IN PROGRESS FOR 5 DAYS Performed at [facility]    Report Status PENDING  Incomplete  Body fluid culture     Status: None (Preliminary result)   Collection Time: 08/20/14 10:41 AM  Result Value Ref Range Status   Specimen Description PERITONEAL CAVITY  Final   Special Requests NONE  Final   Gram Stain   Final    NO WBC SEEN NO ORGANISMS SEEN Performed at [facility]    Culture PENDING  Incomplete   Report Status PENDING  Incomplete     Studies:  Recent x-ray studies have been reviewed in detail by the Attending Physician  Scheduled Meds:  Scheduled Meds: . sodium chloride   Intravenous Once  . antiseptic oral rinse  7 mL Mouth Rinse BID  . Chlorhexidine Gluconate Cloth  6 each Topical Q0600  . folic acid  1 mg Oral Daily  . furosemide  40 mg Intravenous Q12H  . lactulose  20 g Oral Daily  . multivitamin with minerals  1 tablet Oral Daily  . phytonadione  10 mg Oral Daily  . sodium chloride  10-40 mL Intracatheter  Q12H  . sodium chloride  3 mL Intravenous Q12H  . spironolactone  50 mg Oral Daily  . thiamine  100 mg Oral Daily   Or  . thiamine  100 mg Intravenous Daily    Time spent on care of this patient:  40 mins   Marit Goodwill, Geraldo Docker , MD  [facility] Office  [phone] Pager [phone]  On-Call/Text Page:      [email or web address]      password [email or web address]  If 7PM-7AM, please contact night-coverage [email or web address] Password [facility] 08/21/2014, 10:53 AM   LOS: 7 days   Care during the described time interval was provided by me .  I have reviewed this patient's available data, including medical history, events of note, physical examination, radiology studies and test results as part of my evaluation  Dia Crawford, MD [phone] Pager

## 2014-08-21 NOTE — Progress Notes (Signed)
08/21/2014 Foley cath was removed 0740 and patient have 350cc of clear amber urine. [facility] RN.

## 2014-08-21 NOTE — Progress Notes (Signed)
08/21/2014 Patient had a bladder done at 1245. He had >500cc in bladder. Dr Sherral Hammers notified, order to place a foley catheter. Rico Sheehan RN

## 2014-08-21 NOTE — Evaluation (Signed)
Clinical/Bedside Swallow Evaluation Patient Details  Name: John [name] MRN: [id number] Date of Birth: 1955/11/15  Today's Date: 08/21/2014 Time: SLP Start Time (ACUTE ONLY): 5208 SLP Stop Time (ACUTE ONLY): 1312 SLP Time Calculation (min) (ACUTE ONLY): 15 min  Past Medical History:  Past Medical History  Diagnosis Date  . Hypertension   . Chronic back pain   . Stroke 1998  . Polysubstance abuse   . Optic neuropathy, left   . CHF (congestive heart failure)   . Ascites   . ETOH abuse    Past Surgical History:  Past Surgical History  Procedure Laterality Date  . None    . Esophagogastroduodenoscopy N/A 08/14/2014    Procedure: ESOPHAGOGASTRODUODENOSCOPY (EGD);  Surgeon: Ladene Artist, MD;  Location: [facility];  Service: Endoscopy;  Laterality: N/A;  . Radiology with anesthesia N/A 08/15/2014    Procedure: RADIOLOGY WITH ANESTHESIA;  Surgeon: Greggory Keen, MD;  Location: [facility];  Service: Radiology;  Laterality: N/A;   HPI:  59 y.o. BM PMHx Alcoholism, Alcoholic Liver Cirrhosis, coagulopathy, Polysubstance Abuse, accelerated hypertension, thrombocytopenia, who was recently discharged from the hospitalist service on 07/28/2014 at which time he was treated for new onset ascites undergoing paracentesis with removal of 2 L of ascitic fluid.  Admitted 4/17 with UGI bleed; acute blood loss anemia/gastric verices bleeding; renal failure; metabolic encephalopathy.    Assessment / Plan / Recommendation Clinical Impression  Pt's lethargy, decreased initiation, and confusion are the primary barriers to safe eating.  When assisted with self-feeding (needs hand-over-hand manipulation of the utensils) and given mod cues for attention, pt able to safely consume soft solids and thin liquids with sufficient mastication and no s/s of aspiration.  Regular solids are not safely masticated at this time given inattention.  Recommend advancing diet to dysphagia 2, thin liquids with full supervision and  assist for feeding.  SLP will follow for safety and diet advancement.     Aspiration Risk  Moderate    Diet Recommendation Dysphagia 2 (Fine chop);Thin liquid   Liquid Administration via: Cup Medication Administration: Whole meds with puree Supervision: Staff to assist with self feeding;Full supervision/cueing for compensatory strategies Compensations: Slow rate;Small sips/bites;Check for pocketing Postural Changes and/or Swallow Maneuvers: Seated upright 90 degrees    Other  Recommendations Oral Care Recommendations: Oral care BID   Follow Up Recommendations   (tba)    Frequency and Duration min 2x/week  1 week       Swallow Study Prior Functional Status       General Date of Onset: 08/14/14 HPI: 59 y.o. BM PMHx Alcoholism, Alcoholic Liver Cirrhosis, coagulopathy, Polysubstance Abuse, accelerated hypertension, thrombocytopenia, who was recently discharged from the hospitalist service on 07/28/2014 at which time he was treated for new onset ascites undergoing paracentesis with removal of 2 L of ascitic fluid.  Admitted 4/17 with UGI bleed; acute blood loss anemia/gastric verices bleeding; renal failure; metabolic encephalopathy.  Type of Study: Bedside swallow evaluation Previous Swallow Assessment: none per records Diet Prior to this Study: Thin liquids Temperature Spikes Noted: No Respiratory Status: Nasal cannula History of Recent Intubation: No Behavior/Cognition: Lethargic Oral Cavity - Dentition: Missing dentition Self-Feeding Abilities: Needs assist Patient Positioning: Upright in bed Baseline Vocal Quality: Aphonic Volitional Cough: Weak Volitional Swallow: Able to elicit    Oral/Motor/Sensory Function Overall Oral Motor/Sensory Function: Appears within functional limits for tasks assessed   Ice Chips Ice chips: Within functional limits Presentation: Spoon   Thin Liquid Thin Liquid: Within functional limits Presentation: Cup  Nectar Thick Nectar Thick  Liquid: Not tested   Honey Thick Honey Thick Liquid: Not tested   Puree Puree: Within functional limits Presentation: Spoon   Solid      Solid: Impaired Presentation: Self Fed Oral Phase Impairments: Impaired anterior to posterior transit ; portions of bolus removed manually from mouth     Deleon Passe L. Tivis Ringer, [place] CCC/SLP Pager [phone]  Juan Quam Laurice 08/21/2014,1:26 PM

## 2014-08-22 LAB — CBC WITH DIFFERENTIAL/PLATELET
BASOS ABS: 0 10*3/uL (ref 0.0–0.1)
Basophils Relative: 0 % (ref 0–1)
EOS ABS: 0.1 10*3/uL (ref 0.0–0.7)
Eosinophils Relative: 1 % (ref 0–5)
HCT: 23.9 % — ABNORMAL LOW (ref 39.0–52.0)
Hemoglobin: 8.1 g/dL — ABNORMAL LOW (ref 13.0–17.0)
Lymphocytes Relative: 10 % — ABNORMAL LOW (ref 12–46)
Lymphs Abs: 1 10*3/uL (ref 0.7–4.0)
MCH: 28.8 pg (ref 26.0–34.0)
MCHC: 33.9 g/dL (ref 30.0–36.0)
MCV: 85.1 fL (ref 78.0–100.0)
MONOS PCT: 14 % — AB (ref 3–12)
Monocytes Absolute: 1.4 10*3/uL — ABNORMAL HIGH (ref 0.1–1.0)
NEUTROS ABS: 7.8 10*3/uL — AB (ref 1.7–7.7)
Neutrophils Relative %: 75 % (ref 43–77)
Platelets: 86 10*3/uL — ABNORMAL LOW (ref 150–400)
RBC: 2.81 MIL/uL — AB (ref 4.22–5.81)
RDW: 19.1 % — ABNORMAL HIGH (ref 11.5–15.5)
WBC: 10.3 10*3/uL (ref 4.0–10.5)

## 2014-08-22 LAB — COMPREHENSIVE METABOLIC PANEL
ALBUMIN: 1.7 g/dL — AB (ref 3.5–5.2)
ALT: 45 U/L (ref 0–53)
ANION GAP: 8 (ref 5–15)
AST: 133 U/L — ABNORMAL HIGH (ref 0–37)
Alkaline Phosphatase: 47 U/L (ref 39–117)
BUN: 32 mg/dL — ABNORMAL HIGH (ref 6–23)
CO2: 24 mmol/L (ref 19–32)
CREATININE: 2.21 mg/dL — AB (ref 0.50–1.35)
Calcium: 7.4 mg/dL — ABNORMAL LOW (ref 8.4–10.5)
Chloride: 108 mmol/L (ref 96–112)
GFR calc Af Amer: 36 mL/min — ABNORMAL LOW (ref >90)
GFR calc non Af Amer: 31 mL/min — ABNORMAL LOW (ref >90)
GLUCOSE: 117 mg/dL — AB (ref 70–99)
Potassium: 3.7 mmol/L (ref 3.5–5.1)
Sodium: 140 mmol/L (ref 135–145)
Total Bilirubin: 9 mg/dL — ABNORMAL HIGH (ref 0.3–1.2)
Total Protein: 6.3 g/dL (ref 6.0–8.3)

## 2014-08-22 LAB — URINE CULTURE
Colony Count: NO GROWTH
Culture: NO GROWTH

## 2014-08-22 LAB — AMMONIA: Ammonia: 72 umol/L — ABNORMAL HIGH (ref 11–32)

## 2014-08-22 LAB — PATHOLOGIST SMEAR REVIEW

## 2014-08-22 MED ORDER — CIPROFLOXACIN IN D5W 400 MG/200ML IV SOLN
400.0000 mg | Freq: Two times a day (BID) | INTRAVENOUS | Status: DC
  Administered 2014-08-22 – 2014-08-25 (×6): 400 mg via INTRAVENOUS
  Filled 2014-08-22 (×8): qty 200

## 2014-08-22 MED ORDER — ACETAMINOPHEN 500 MG PO TABS
500.0000 mg | ORAL_TABLET | Freq: Four times a day (QID) | ORAL | Status: DC | PRN
  Administered 2014-08-22: 500 mg via ORAL
  Filled 2014-08-22: qty 1

## 2014-08-22 MED ORDER — LACTULOSE 10 GM/15ML PO SOLN
20.0000 g | Freq: Three times a day (TID) | ORAL | Status: DC
  Administered 2014-08-22 – 2014-08-26 (×10): 20 g via ORAL
  Filled 2014-08-22 (×13): qty 30

## 2014-08-22 MED ORDER — ACETAMINOPHEN 325 MG PO TABS: 325.0000 mg | ORAL_TABLET | Freq: Four times a day (QID) | ORAL | Status: DC | PRN

## 2014-08-22 NOTE — Progress Notes (Signed)
[place] TEAM 1 - Stepdown/ICU TEAM Progress Note  John [name] TDV:[id number] DOB: 09-May-1955 DOA: 08/14/2014 PCP: No PCP Per Patient  Admit HPI / Brief Narrative: 59 y.o. male with a history of alcoholism, cirrhosis, coagulopathy, and polysubstance abuse who was discharged from the Hospitalist service on 07/28/2014 having undergone treatment for new onset ascites via paracentesis with removal of 2 L of fluid. He returned to the ED 4/17 with complaints of hematemesis over 3 days as well as bright red blood per rectum. He had several episodes of bloody/maroon liquid stools in the ED. He had a hemoglobin of 5.4 and platelet count of 98,000. INR 1.61 with PT of 19.3. Blood pressure was 125/74 with a pulse of 80, satting 100% on 2 L. Patient complained of abdominal pain located in the epigastric region. He takes Aleve as well as aspirin on a daily basis, and reported he had only had one beer since his last hospitalization.   HPI/Subjective: The patient is awake but quite confused today.  He cannot tell me where he is or why he is here.  He responds to minimize questions with nonsensical answers but answers others appropriately.  He cannot provide a reliable history at this time.  He does not appear to be any acute distress.  There are no focal neurologic deficits appreciable.  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 Noted via EGD - now s/p balloon-occluded transvenous obliteration per IR - is hemodynamically stable at present   Acute blood loss anemia Hemoglobin has stabilized  - status post 4 units of packed red blood cells - continue to follow trend - transfuse as needed to keep Hgb 7.0 or >  Alcoholic and HCV cirrhosis with ascites Previously counseled patient as to the absolute need to discontinue all forms of alcohol immediately and completely and as to the  likelihood of his early death should he choose to continue to drink - to f/u w/ GI after d/c   Thrombocytopenia  -Most likely secondary to cirrhosis -avoid all heparin products, NSAIDs -Slowly improving  Acute on chronic renal failure (baseline creatinine 1.49 on 3/16)  -Creatinine appears to have stabilized at approximately 2.2 - this may be his new baseline - stop fluid as patient appears overloaded and follow  FUO Pt had fever to 101.1 last night - will treat as presumed SBP - blood cultures and urinalysis unrevealing thus far - chest x-ray without acute findings - if persists we'll have to consider removing PICC line which appears to been placed 0/62  Metabolic encephalopathy -Have discontinued all sedating medication to include morphine, Zofran, oxycodone -Out of bed to chair q shift -Ammonia level remains elevated - adjust treatment and follow  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  Code Status: FULL Family Communication: no family present at time of exam Disposition Plan: Stepdown unit  Consultants: GI [facility]ler IR   Procedures: 4/17 - EGD - Large gastric cardia varices with superficial ulcer 4/18 - BRTO of the gastric varices with STS sclerotherapy and coils 4/19 - Successful fluoroscopic guided removal of occlusion balloon and vascular sheath following BRTO procedure  4/23 US guided paracentesis - 2L removed  Antibiotics: Cipro 4/17 > 4/21 + 4/25 >  DVT prophylaxis: SCDs  Objective: Blood pressure 130/87, pulse 84, temperature 99.9 F (37.7 C), temperature source Axillary, resp. rate 27, height 5\' 10"  (1.778 m), weight 116.9 kg (257 lb 11.5 oz), SpO2  97 %.  Intake/Output Summary (Last 24 hours) at 08/22/14 1609 Last data filed at 08/22/14 1400  Gross per 24 hour  Intake   2610 ml  Output   1550 ml  Net   1060 ml   Exam: General: No acute respiratory distress - confused/not oriented to time or place Lungs: Clear to auscultation bilaterally without wheezes or crackles Cardiovascular: Regular rate and rhythm without murmur gallop or rub Abdomen: Nontender, protuberent, soft, bowel sounds positive, no rebound, obvious ascites Extremities: No significant cyanosis, or clubbing; 2+ edema bilateral lower extremities  Data Reviewed: Basic Metabolic Panel:  Recent Labs Lab 08/18/14 0600 08/19/14 0450 08/20/14 0500 08/21/14 0548 08/22/14 0546  NA 150* 145 133* 138 140  K 3.8 3.5 3.5 3.6 3.7  CL 120* 116* 106 108 108  CO2 22 24 23 25 24   GLUCOSE 100* 117* 247* 103* 117*  BUN 29* 31* 29* 29* 32*  CREATININE 2.47* 2.41* 2.30* 2.11* 2.21*  CALCIUM 7.9* 7.6* 7.2* 7.4* 7.4*    Liver Function Tests:  Recent Labs Lab 08/18/14 0600 08/19/14 0450 08/20/14 0500 08/21/14 0548 08/22/14 0546  AST 130* 97* 88* 114* 133*  ALT 59* 48 41 42 45  ALKPHOS 50 44 45 55 47  BILITOT 5.0* 5.6* 7.0* 8.7* 9.0*  PROT 5.5* 5.7* 5.2* 5.6* 6.3  ALBUMIN 2.0* 1.8* 1.6* 1.6* 1.7*   Coags:  Recent Labs Lab 08/16/14 0400 08/17/14 1533 08/18/14 0600 08/19/14 0450  INR 1.45 1.60* 1.43 1.50*   CBC:  Recent Labs Lab 08/16/14 0400  08/17/14 2210 08/19/14 0450 08/20/14 0500 08/21/14 0548 08/22/14 0546  WBC 7.7  < > 10.6* 10.0 9.6 10.4 10.3  NEUTROABS 5.9  --   --  6.6 6.2 6.8 7.8*  HGB 8.1*  < > 8.0* 7.7* 7.3* 8.0* 8.1*  HCT 23.7*  < > 24.1* 22.7* 21.8* 23.1* 23.9*  MCV 84.9  < > 88.3 88.0 86.9 85.6 85.1  PLT 62*  < > 63* 50* 56* 75* 86*  < > = values in this interval not displayed.   CBG:  Recent Labs Lab 08/17/14 2142 08/20/14 0833  GLUCAP 92 93    Recent Results (from the past 240 hour(s))  MRSA PCR Screening     Status:  None   Collection Time: 08/15/14 12:24 AM  Result Value Ref Range Status   MRSA by PCR NEGATIVE NEGATIVE Final    Comment:        The GeneXpert MRSA Assay (FDA approved for NASAL specimens only), is one component of a comprehensive MRSA colonization surveillance program. It is not intended to diagnose MRSA infection nor to guide or monitor treatment for MRSA infections.   Culture, blood (routine x 2)     Status: None (Preliminary result)   Collection Time: 08/19/14  1:23 PM  Result Value Ref Range Status   Specimen Description BLOOD LEFT HAND  Final   Special Requests BOTTLES DRAWN AEROBIC AND ANAEROBIC 10CC 5CC  Final   Culture   Final           BLOOD CULTURE RECEIVED NO GROWTH TO DATE CULTURE WILL BE HELD FOR 5 DAYS BEFORE ISSUING A FINAL NEGATIVE REPORT Performed at [facility]    Report Status PENDING  Incomplete  Culture, blood (routine x 2)     Status: None (Preliminary result)   Collection Time: 08/19/14  1:30 PM  Result Value Ref Range Status   Specimen Description BLOOD LEFT HAND  Final   Special Requests BOTTLES DRAWN AEROBIC AND ANAEROBIC 10CC 5CC  Final   Culture   Final           BLOOD CULTURE RECEIVED NO GROWTH TO DATE CULTURE WILL BE HELD FOR 5 DAYS BEFORE ISSUING A FINAL NEGATIVE REPORT Performed at [facility]    Report Status PENDING  Incomplete  Culture, Urine     Status: None   Collection Time: 08/19/14  8:58 PM  Result Value Ref Range Status   Specimen Description URINE, CATHETERIZED  Final   Special Requests NONE  Final   Colony Count NO GROWTH Performed at [facility]   Final   Culture NO GROWTH Performed at [facility]   Final   Report Status 08/21/2014 FINAL  Final  Anaerobic culture     Status: None (Preliminary result)   Collection Time: 08/20/14 10:41 AM  Result Value Ref Range Status   Specimen Description PERITONEAL CAVITY  Final   Special Requests NONE  Final   Gram Stain   Final    NO WBC SEEN NO  SQUAMOUS EPITHELIAL CELLS SEEN NO ORGANISMS SEEN Performed at [facility]    Culture   Final    NO ANAEROBES ISOLATED; CULTURE IN PROGRESS FOR 5 DAYS Performed at [facility]    Report Status PENDING  Incomplete  Body fluid culture     Status: None (Preliminary result)   Collection Time: 08/20/14 10:41 AM  Result Value Ref Range Status   Specimen Description PERITONEAL CAVITY  Final   Special Requests NONE  Final   Gram Stain   Final    NO WBC SEEN NO ORGANISMS SEEN Performed at [facility]    Culture   Final    NO GROWTH 1 DAY Performed at [facility]    Report Status PENDING  Incomplete  Culture, blood (routine x 2)     Status: None (Preliminary result)   Collection Time: 08/21/14 11:39 AM  Result Value Ref Range Status   Specimen Description LEFT ANTECUBITAL  Final   Special Requests BOTTLES DRAWN AEROBIC ONLY  6CC  Final   Culture   Final           BLOOD CULTURE RECEIVED NO GROWTH TO DATE CULTURE WILL BE HELD FOR 5 DAYS BEFORE ISSUING A FINAL NEGATIVE REPORT Performed at [facility]    Report Status PENDING  Incomplete  Culture, blood (routine x 2)     Status: None (Preliminary result)   Collection Time: 08/21/14 11:49 AM  Result Value Ref Range Status   Specimen Description LEFT ANTECUBITAL  Final   Special Requests BOTTLES DRAWN AEROBIC ONLY  2CC  Final   Culture   Final           BLOOD CULTURE RECEIVED NO GROWTH TO DATE CULTURE WILL BE HELD FOR 5 DAYS BEFORE ISSUING A FINAL NEGATIVE REPORT Performed at [facility]    Report Status PENDING  Incomplete     Studies:   Recent x-ray studies have been reviewed in detail by the Attending Physician  Scheduled Meds:  Scheduled Meds: . sodium chloride   Intravenous Once  . antiseptic oral rinse  7 mL Mouth Rinse BID  . Chlorhexidine Gluconate Cloth  6 each Topical Q0600  . folic acid  1 mg Oral Daily  . furosemide  40 mg Intravenous Q12H  . lactulose  20 g  Oral Daily  . multivitamin with minerals  1 tablet Oral Daily  . phytonadione  10 mg Oral Daily  . sodium chloride  10-40 mL Intracatheter Q12H  . sodium chloride  3 mL Intravenous Q12H  . spironolactone  50 mg Oral Daily  . thiamine  100 mg Oral Daily   Or  . thiamine  100 mg Intravenous Daily    Time spent on care of this patient: 35 mins   Alaijah Gibler T , MD   [facility] Office  [phone] Pager - Text Page per Shea Evans as per below:  On-Call/Text Page:      [email or web address]      password [email or web address]  If 7PM-7AM, please contact night-coverage [email or web address] Password [email or web address] 08/22/2014, 4:09 PM   LOS: 8 days

## 2014-08-23 ENCOUNTER — Ambulatory Visit: Payer: Medicaid Other | Admitting: Neurology

## 2014-08-23 DIAGNOSIS — I5189 Other ill-defined heart diseases: Secondary | ICD-10-CM | POA: Diagnosis present

## 2014-08-23 DIAGNOSIS — I519 Heart disease, unspecified: Secondary | ICD-10-CM

## 2014-08-23 LAB — CBC WITH DIFFERENTIAL/PLATELET
BASOS PCT: 1 % (ref 0–1)
Basophils Absolute: 0.1 10*3/uL (ref 0.0–0.1)
EOS ABS: 0 10*3/uL (ref 0.0–0.7)
Eosinophils Relative: 0 % (ref 0–5)
HCT: 23.2 % — ABNORMAL LOW (ref 39.0–52.0)
Hemoglobin: 8 g/dL — ABNORMAL LOW (ref 13.0–17.0)
LYMPHS PCT: 10 % — AB (ref 12–46)
Lymphs Abs: 1.4 10*3/uL (ref 0.7–4.0)
MCH: 28.9 pg (ref 26.0–34.0)
MCHC: 34.5 g/dL (ref 30.0–36.0)
MCV: 83.8 fL (ref 78.0–100.0)
Monocytes Absolute: 1.8 10*3/uL — ABNORMAL HIGH (ref 0.1–1.0)
Monocytes Relative: 13 % — ABNORMAL HIGH (ref 3–12)
NEUTROS ABS: 10.4 10*3/uL — AB (ref 1.7–7.7)
Neutrophils Relative %: 76 % (ref 43–77)
Platelets: 105 10*3/uL — ABNORMAL LOW (ref 150–400)
RBC: 2.77 MIL/uL — ABNORMAL LOW (ref 4.22–5.81)
RDW: 19.2 % — AB (ref 11.5–15.5)
WBC: 13.7 10*3/uL — ABNORMAL HIGH (ref 4.0–10.5)

## 2014-08-23 LAB — COMPREHENSIVE METABOLIC PANEL
ALBUMIN: 1.6 g/dL — AB (ref 3.5–5.2)
ALT: 52 U/L (ref 0–53)
ANION GAP: 9 (ref 5–15)
AST: 160 U/L — AB (ref 0–37)
Alkaline Phosphatase: 57 U/L (ref 39–117)
BUN: 37 mg/dL — ABNORMAL HIGH (ref 6–23)
CALCIUM: 7.6 mg/dL — AB (ref 8.4–10.5)
CHLORIDE: 106 mmol/L (ref 96–112)
CO2: 23 mmol/L (ref 19–32)
CREATININE: 2.33 mg/dL — AB (ref 0.50–1.35)
GFR calc non Af Amer: 29 mL/min — ABNORMAL LOW (ref >90)
GFR, EST AFRICAN AMERICAN: 34 mL/min — AB (ref >90)
GLUCOSE: 100 mg/dL — AB (ref 70–99)
POTASSIUM: 3.8 mmol/L (ref 3.5–5.1)
Sodium: 138 mmol/L (ref 135–145)
Total Bilirubin: 8.5 mg/dL — ABNORMAL HIGH (ref 0.3–1.2)
Total Protein: 6.8 g/dL (ref 6.0–8.3)

## 2014-08-23 LAB — AMMONIA: AMMONIA: 58 umol/L — AB (ref 11–32)

## 2014-08-23 LAB — PROTIME-INR
INR: 1.35 (ref 0.00–1.49)
PROTHROMBIN TIME: 16.8 s — AB (ref 11.6–15.2)

## 2014-08-23 MED ORDER — PANTOPRAZOLE SODIUM 40 MG PO TBEC
40.0000 mg | DELAYED_RELEASE_TABLET | Freq: Every day | ORAL | Status: DC
  Administered 2014-08-23 – 2014-08-26 (×4): 40 mg via ORAL
  Filled 2014-08-23 (×3): qty 1

## 2014-08-23 NOTE — Evaluation (Signed)
Physical Therapy Evaluation Patient Details Name: John [name] MRN: [id number] DOB: 07-06-1955 Today's Date: 08/23/2014   History of Present Illness  59 y.o. male with a history of alcoholism, cirrhosis, coagulopathy, and polysubstance abuse who was discharged from the Hospitalist service on 07/28/2014 having undergone treatment for new onset ascites via paracentesis with removal of 2 L of fluid. He returned to the ED 4/17 with complaints of hematemesis over 3 days as well as bright red blood per rectum  Clinical Impression  Pt lethargic on arrival with decreased awareness, memory and problem solving. Pt with significantly impaired balance, transfers, and function currently requiring 2 person assist for bed mobility and safety with inability to transfer OOB without lift equipment at this time. Pt normally independent per pt and chart. Pt will benefit from acute therapy to maximize mobility, function, strength and cognition to decrease burden of care.     Follow Up Recommendations SNF;Supervision/Assistance - 24 hour    Equipment Recommendations  Rolling walker with 5" wheels;3in1 (PT)    Recommendations for Other Services OT consult     Precautions / Restrictions Precautions Precautions: Fall      Mobility  Bed Mobility Overal bed mobility: Needs Assistance;+2 for physical assistance Bed Mobility: Rolling;Sidelying to Sit;Sit to Supine Rolling: Mod assist Sidelying to sit: Max assist   Sit to supine: Max assist   General bed mobility comments: pt with max multimodal cues to reach for rail, rotate pelvis and trunk to roll. Side to sit max assist to elevate trunk from surface with pt maintaining posterior lean throughout. Pt mod assist for sitting with bil UE support due to posterior lean. Return to supine max assist of bil LE. pt able to scoot to [facility] in full trendelenburg with cues and head rail   Transfers Overall transfer level:  (pt unable at this time)                   Ambulation/Gait                Stairs            Wheelchair Mobility    Modified Rankin (Stroke Patients Only)       Balance Overall balance assessment: Needs assistance   Sitting balance-Leahy Scale: Zero                                       Pertinent Vitals/Pain Pain Assessment: Faces Pain Score: 6  Pain Location: pt reporting vague pain in right thigh but complains and moans with all movement, non -specific location Pain Descriptors / Indicators: Aching Pain Intervention(s): Limited activity within patient's tolerance;Repositioned    Home Living Family/patient expects to be discharged to:: Private residence   Available Help at Discharge: Family;Available PRN/intermittently Type of Home: House Home Access: Level entry     Home Layout: One level Home Equipment: Cane - single point      Prior Function Level of Independence: Independent               Hand Dominance        Extremity/Trunk Assessment   Upper Extremity Assessment: Generalized weakness           Lower Extremity Assessment: Generalized weakness         Communication   Communication: No difficulties  Cognition Arousal/Alertness: Lethargic Behavior During Therapy: Flat affect Overall Cognitive Status: Impaired/Different from baseline Area of Impairment:  Orientation;Attention;Memory;Following commands;Safety/judgement;Problem solving Orientation Level: Disoriented to;Time Current Attention Level: Focused Memory: Decreased short-term memory Following Commands: Follows one step commands inconsistently Safety/Judgement: Decreased awareness of safety;Decreased awareness of deficits   Problem Solving: Slow processing;Decreased initiation;Difficulty sequencing;Requires verbal cues;Requires tactile cues General Comments: Pt stating it is 2027, that he can run with a 50 year old, and that he walks with 4 canes    General Comments      Exercises         Assessment/Plan    PT Assessment Patient needs continued PT services  PT Diagnosis Difficulty walking;Generalized weakness;Altered mental status   PT Problem List Decreased strength;Decreased cognition;Decreased activity tolerance;Decreased balance;Decreased mobility;Decreased safety awareness;Obesity;Pain  PT Treatment Interventions DME instruction;Functional mobility training;Therapeutic activities;Therapeutic exercise;Balance training;Cognitive remediation;Neuromuscular re-education;Patient/family education   PT Goals (Current goals can be found in the Care Plan section) Acute Rehab PT Goals Patient Stated Goal: return home and play PT Goal Formulation: With patient Time For Goal Achievement: 09/06/14 Potential to Achieve Goals: Fair    Frequency Min 3X/week   Barriers to discharge Decreased caregiver support      Co-evaluation               End of Session   Activity Tolerance: Patient tolerated treatment well Patient left: in bed;with call bell/phone within reach;with bed alarm set;with nursing/sitter in room Nurse Communication: Mobility status;Precautions;Need for lift equipment         Time: 4469-5072 PT Time Calculation (min) (ACUTE ONLY): 18 min   Charges:   PT Evaluation $Initial PT Evaluation Tier I: 1 Procedure     PT G CodesMelford Aase 08/23/2014, 1:55 PM Elwyn Reach, [place]

## 2014-08-23 NOTE — Plan of Care (Signed)
Problem: Phase II Progression Outcomes Goal: H&H stablized < 1gm drop in 24 hrs Outcome: Progressing Hgb 7.3 on 08/20/14 --> Hgb 8.1 on 08/22/14

## 2014-08-23 NOTE — Clinical Social Work Placement (Signed)
   CLINICAL SOCIAL WORK PLACEMENT  NOTE  Date:  08/23/2014  Patient Details  Name: John [name] MRN: [id number] Date of Birth: April 15, 1956  Clinical Social Work is seeking post-discharge placement for this patient at the [place] level of care (*CSW will initial, date and re-position this form in  chart as items are completed):  Yes   Patient/family provided with [place] Work Department's list of facilities offering this level of care within the geographic area requested by the patient (or if unable, by the patient's family).  Yes   Patient/family informed of their freedom to choose among providers that offer the needed level of care, that participate in Medicare, Medicaid or managed care program needed by the patient, have an available bed and are willing to accept the patient.  Yes   Patient/family informed of [facility]'s ownership interest in [facility] and [facility], as well as of the fact that they are under no obligation to receive care at these facilities.  PASRR submitted to EDS on 08/23/14     PASRR number received on 08/23/14     Existing PASRR number confirmed on       FL2 transmitted to all facilities in geographic area requested by pt/family on       FL2 transmitted to all facilities within larger geographic area on 08/23/14     Patient informed that his/her managed care company has contracts with or will negotiate with certain facilities, including the following:            Patient/family informed of bed offers received.  Patient chooses bed at       Physician recommends and patient chooses bed at      Patient to be transferred to   on  .  Patient to be transferred to facility by       Patient family notified on   of transfer.  Name of family member notified:        PHYSICIAN       Additional Comment:    _______________________________________________ Cranford Mon, LCSW 08/23/2014, 4:12 PM

## 2014-08-23 NOTE — Clinical Social Work Note (Signed)
Clinical Social Work Assessment  Patient Details  Name: John [name] MRN: [id number] Date of Birth: [birth year]  Date of referral:  08/23/14               Reason for consult:  Facility Placement                Permission sought to share information with:    Permission granted to share information::  No  Name::        Agency::     Relationship::     Contact Information:     Housing/Transportation Living arrangements for the past 2 months:  [place] of Information:  Patient Patient Interpreter Needed:  None Criminal Activity/Legal Involvement Pertinent to Current Situation/Hospitalization:  No - Comment as needed Significant Relationships:  Parents Lives with:  Friends Do you feel safe going back to the place where you live?  Yes Need for family participation in patient care:  No (Coment)  Care giving concerns:  Pt states that he lives with friends and that they are able to help him out if needed- pt could not elaborate much due to lethargy   Social Worker assessment / plan: CSW spoke with pt about PT recommendation for SNF placement- pt was agreeable to placement if needed.  Employment status:  Other (Comment) (unable to assess) Insurance information:  Medicaid In [place] PT Recommendations:  [place] / Referral to community resources:  [place]  Patient/Family's Response to care:  Patient did not have a strong response to discussion of SNF- unsure if pt was able to process conversation fully due to lethargy  Patient/Family's Understanding of and Emotional Response to Diagnosis, Current Treatment, and Prognosis:  Unable to assess  Emotional Assessment Appearance:  Appears stated age Attitude/Demeanor/Rapport:  Unable to Assess (pt was lethargic) Affect (typically observed):  Flat Orientation:  Oriented to Self, Oriented to Place, Oriented to  Time, Oriented to Situation Alcohol / Substance use:  Not  Applicable Psych involvement (Current and /or in the community):  No (Comment)  Discharge Needs  Concerns to be addressed:  Home Safety Concerns Readmission within the last 30 days:  Yes Current discharge risk:  Physical Impairment Barriers to Discharge:  Continued Medical Work up   [employer], LCSW 08/23/2014, 3:54 PM

## 2014-08-23 NOTE — Plan of Care (Signed)
Problem: Phase I Progression Outcomes Goal: OOB as tolerated unless otherwise ordered Outcome: Progressing Pt able to assist in turning and pulling himself up in bed. Still needs 2 person assist to dangle in bed.

## 2014-08-23 NOTE — Progress Notes (Signed)
[place] TEAM 1 - Stepdown/ICU TEAM Progress Note  John [name] LOV:[id number] DOB: 04-10-1956 DOA: 08/14/2014 PCP: No PCP Per Patient  Admit HPI / Brief Narrative: John [name] is a 59 y.o. BM PMHx Alcoholism, Alcoholic Liver Cirrhosis, coagulopathy, Polysubstance Abuse, accelerated hypertension, thrombocytopenia, who was recently discharged from the hospitalist service on 07/28/2014 at which time he was treated for new onset ascites undergoing paracentesis with removal of 2 L of ascitic fluid.  Presents to the emergency department with complaints of hematemesis over the past 3 days as well as bright red blood per rectum. He was noted have several episodes of bloody/maroon colored liquid consistency stools in the emergency department. CBC showed a hemoglobin of 5.4 with hematocrit of 16 and platelet count of 98,000. He has an INR 1.61 with PT of 19.3. He was urgently typed and crossed and received his first unit of packed red blood cells while in the emergency department. Last blood pressure reading was 125/74 with a pulse of 80, satting 100% on 2 L up in oxygen via nasal cannula. Patient also complains of abdominal pain located in the epigastric region. He takes Aleve as well as aspirin on a daily basis, and tells me he has only had one beer since his last hospitalization. I spoke with Dr. Fuller Plan of GI from the emergency department.   HPI/Subjective: 4/ 26 A/O 3 (does not go when), follows all commands   Assessment/Plan: Upper GI bleed.  -4/18 S/P Balloon-occluded Retrograde Transvenous Obliteration (BRTO) by GI with balloon removed today. -Patient to remain nothing by mouth except for sips with medication my: Explain this to the patient. -Hemoglobin stable - 4/18 transfused 3 units PRBC   -4/26 stable -Protonix 40 mg daily  Acute blood loss anemia/gastric varices bleeding -See upper GI bleed  Alcohol-induced liver cirrhosis with ascites/HCV cirrhosis -Last  month paracentesis; 2 L fluid removed -Patient's abdomen still distended softer post paracentesis. -4/23 US guided paracentesis removed 1.7 L; fluid sent for stain, culture, and cytology  Thrombocytopenia  -Most likely secondary to alcohol liver cirrhosis -Will avoid all heparin products, NSAIDs -Trended slightly up today   Acute on chronic renal failure (baseline 1.49 on 3/16)  -Trending trending up -Patient is euvolemic CVP = 4 , therefore would not increase diuresis .   Elevated transaminases. - consistent with alcoholic hepatitis.   Chronic alcoholism. - Continue CIWA protocol (4/17 alcohol negative)  Hypertension.  -Lasix 40 mg BID restarted secondary to patient's alcoholic liver cirrhosis -Spironolactone 50 mg restarted secondary to patient's alcohol liver cirrhosis  Left ventricle hypertrophy/diastolic dysfunction   Hypernatremia -Resolved with administration of IV free water  - stop IV fluid and follow  FUO -4/24 Patient spiked a fever of 38.4 yesterday.  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 -(grade 1 diastolic dysfunction).-  4/18 transfused 3 units PRBC 4/18 S/P Balloon-occluded Retrograde Transvenous Obliteration (BRTO) 4/22 PCXR;Small left pleural effusion. Left perihilar and retrocardiac opacity may reflect atelectasis, pneumonia,  or perhaps  asymmetric edema 4/23 US guided paracentesis removed 1.7 L  Culture 4/18 MRSA by PCR negative 4/22 blood left hand 2 NGTD 4/22 urine negative 4/23 peritoneal fluid NGTD  4/24 blood left antecubital 2 NGTD 4/24 urine negative   Antibiotics: Ciprofloxacin 4/17>> stopped 4/21 Ciprofloxacin 4/25>>  DVT prophylaxis: SCD   Devices    LINES / TUBES:  Double-lumen PICC 4/19>>    Continuous Infusions:    Objective: VITAL SIGNS: Temp: 99.5 F (37.5 C) (04/26 2020) Temp Source: Oral (04/26 2020) BP: 138/76 mmHg (04/26 2020) Pulse Rate: 86 (04/26 2020) SPO2; FIO2:   Intake/Output Summary (Last 24 hours) at 08/23/14 2104 Last data filed at 08/23/14 1300  Gross per 24 hour  Intake   1400 ml  Output    325 ml  Net   1075 ml     Exam: General: A/O 3 (does not know when), follows all commands No acute respiratory distress Lungs:  air movement in all lobes, mild rhonchi ,without wheezes or crackles Cardiovascular: Regular rate and rhythm without murmur gallop or rub normal S1 and S2 Abdomen: Nontender to palpation, distended, firm, bowel sounds positive, no rebound, no ascites, no appreciable mass, negative fluid wave Extremities: No significant cyanosis, clubbing, or edema bilateral lower extremities  Data Reviewed: Basic Metabolic Panel:  Recent Labs Lab 08/19/14 0450 08/20/14 0500 08/21/14 0548 08/22/14 0546 08/23/14 0617  NA 145 133* 138 140 138  K 3.5 3.5 3.6 3.7 3.8  CL 116* 106 108 108 106  CO2 24 23 25 24 23   GLUCOSE 117* 247* 103* 117* 100*  BUN 31* 29* 29* 32* 37*  CREATININE 2.41* 2.30* 2.11* 2.21* 2.33*  CALCIUM 7.6* 7.2* 7.4* 7.4* 7.6*   Liver Function Tests:  Recent Labs Lab 08/19/14 0450 08/20/14 0500 08/21/14 0548 08/22/14 0546 08/23/14 0617  AST 97* 88* 114* 133* 160*  ALT 48 41 42 45 52  ALKPHOS 44 45 55 47 57  BILITOT 5.6* 7.0* 8.7* 9.0* 8.5*  PROT 5.7* 5.2* 5.6* 6.3 6.8  ALBUMIN 1.8* 1.6* 1.6* 1.7* 1.6*   No results for  input(s): LIPASE, AMYLASE in the last 168 hours.  Recent Labs Lab 08/18/14 0613 08/19/14 0500 08/21/14 1455 08/22/14 0615 08/23/14 0500  AMMONIA 31 39* 49* 72* 58*   CBC:  Recent Labs Lab 08/19/14 0450 08/20/14 0500 08/21/14 0548 08/22/14 0546 08/23/14 0617  WBC 10.0 9.6 10.4 10.3 13.7*  NEUTROABS 6.6 6.2 6.8 7.8* 10.4*  HGB 7.7* 7.3* 8.0* 8.1* 8.0*  HCT 22.7* 21.8* 23.1* 23.9* 23.2*  MCV 88.0 86.9 85.6 85.1 83.8  PLT 50* 56* 75* 86* 105*   Cardiac Enzymes: No results for input(s): CKTOTAL, CKMB, CKMBINDEX, TROPONINI in the last 168 hours. BNP (last 3 results)  Recent Labs  06/24/14 0802  BNP 1168.5*    ProBNP (last 3 results) No results for input(s): PROBNP in the last 8760 hours.  CBG:  Recent Labs Lab 08/17/14 2142 08/20/14 0833  GLUCAP 92 93    Recent Results (from the past 240 hour(s))  MRSA PCR Screening     Status: None   Collection Time: 08/15/14 12:24 AM  Result Value Ref Range Status   MRSA by PCR NEGATIVE NEGATIVE Final    Comment:        The GeneXpert MRSA Assay (FDA approved for NASAL specimens only), is one component of a comprehensive MRSA colonization surveillance program. It is not intended to diagnose MRSA infection nor to guide or monitor treatment for MRSA infections.  Culture, blood (routine x 2)     Status: None (Preliminary result)   Collection Time: 08/19/14  1:23 PM  Result Value Ref Range Status   Specimen Description BLOOD LEFT HAND  Final   Special Requests BOTTLES DRAWN AEROBIC AND ANAEROBIC 10CC 5CC  Final   Culture   Final           BLOOD CULTURE RECEIVED NO GROWTH TO DATE CULTURE WILL BE HELD FOR 5 DAYS BEFORE ISSUING A FINAL NEGATIVE REPORT Performed at [facility]    Report Status PENDING  Incomplete  Culture, blood (routine x 2)     Status: None (Preliminary result)   Collection Time: 08/19/14  1:30 PM  Result Value Ref Range Status   Specimen Description BLOOD LEFT HAND  Final   Special  Requests BOTTLES DRAWN AEROBIC AND ANAEROBIC 10CC 5CC  Final   Culture   Final           BLOOD CULTURE RECEIVED NO GROWTH TO DATE CULTURE WILL BE HELD FOR 5 DAYS BEFORE ISSUING A FINAL NEGATIVE REPORT Performed at [facility]    Report Status PENDING  Incomplete  Culture, Urine     Status: None   Collection Time: 08/19/14  8:58 PM  Result Value Ref Range Status   Specimen Description URINE, CATHETERIZED  Final   Special Requests NONE  Final   Colony Count NO GROWTH Performed at [facility]   Final   Culture NO GROWTH Performed at [facility]   Final   Report Status 08/21/2014 FINAL  Final  Anaerobic culture     Status: None (Preliminary result)   Collection Time: 08/20/14 10:41 AM  Result Value Ref Range Status   Specimen Description PERITONEAL CAVITY  Final   Special Requests NONE  Final   Gram Stain   Final    NO WBC SEEN NO SQUAMOUS EPITHELIAL CELLS SEEN NO ORGANISMS SEEN Performed at [facility]    Culture   Final    NO ANAEROBES ISOLATED; CULTURE IN PROGRESS FOR 5 DAYS Performed at [facility]    Report Status PENDING  Incomplete  Body fluid culture     Status: None (Preliminary result)   Collection Time: 08/20/14 10:41 AM  Result Value Ref Range Status   Specimen Description PERITONEAL CAVITY  Final   Special Requests NONE  Final   Gram Stain   Final    NO WBC SEEN NO ORGANISMS SEEN Performed at [facility]    Culture   Final    NO GROWTH 2 DAYS Performed at [facility]    Report Status PENDING  Incomplete  Culture, blood (routine x 2)     Status: None (Preliminary result)   Collection Time: 08/21/14 11:39 AM  Result Value Ref Range Status   Specimen Description LEFT ANTECUBITAL  Final   Special Requests BOTTLES DRAWN AEROBIC ONLY  6CC  Final   Culture   Final           BLOOD CULTURE RECEIVED NO GROWTH TO DATE CULTURE WILL BE HELD FOR 5 DAYS BEFORE ISSUING A FINAL NEGATIVE REPORT Performed at  [facility]    Report Status PENDING  Incomplete  Culture, blood (routine x 2)     Status: None (Preliminary result)   Collection Time: 08/21/14 11:49 AM  Result Value Ref Range Status   Specimen Description LEFT ANTECUBITAL  Final   Special Requests BOTTLES DRAWN AEROBIC ONLY  2CC  Final  Culture   Final           BLOOD CULTURE RECEIVED NO GROWTH TO DATE CULTURE WILL BE HELD FOR 5 DAYS BEFORE ISSUING A FINAL NEGATIVE REPORT Performed at [facility]    Report Status PENDING  Incomplete  Culture, Urine     Status: None   Collection Time: 08/21/14  2:04 PM  Result Value Ref Range Status   Specimen Description URINE, RANDOM  Final   Special Requests NONE  Final   Colony Count NO GROWTH Performed at [facility]   Final   Culture NO GROWTH Performed at [facility]   Final   Report Status 08/22/2014 FINAL  Final     Studies:  Recent x-ray studies have been reviewed in detail by the Attending Physician  Scheduled Meds:  Scheduled Meds: . antiseptic oral rinse  7 mL Mouth Rinse BID  . Chlorhexidine Gluconate Cloth  6 each Topical Q0600  . ciprofloxacin  400 mg Intravenous Q12H  . folic acid  1 mg Oral Daily  . furosemide  40 mg Intravenous Q12H  . lactulose  20 g Oral TID  . multivitamin with minerals  1 tablet Oral Daily  . pantoprazole  40 mg Oral Daily  . phytonadione  10 mg Oral Daily  . spironolactone  50 mg Oral Daily  . thiamine  100 mg Oral Daily    Time spent on care of this patient: 40 mins   Cortney Beissel, Geraldo Docker , MD  [facility] Office  [phone] Pager [phone]  On-Call/Text Page:      [email or web address]      password [email or web address]  If 7PM-7AM, please contact night-coverage [email or web address] Password [email or web address] 08/23/2014, 9:04 PM   LOS: 9 days   Care during the described time interval was provided by me .  I have reviewed this patient's available data, including medical history, events of note, physical examination, radiology  studies and test results as part of my evaluation  Dia Crawford, MD [phone] Pager

## 2014-08-24 ENCOUNTER — Encounter: Payer: Self-pay | Admitting: Neurology

## 2014-08-24 LAB — CBC
HCT: 20.9 % — ABNORMAL LOW (ref 39.0–52.0)
Hemoglobin: 7.1 g/dL — ABNORMAL LOW (ref 13.0–17.0)
MCH: 28.5 pg (ref 26.0–34.0)
MCHC: 34 g/dL (ref 30.0–36.0)
MCV: 83.9 fL (ref 78.0–100.0)
PLATELETS: 112 10*3/uL — AB (ref 150–400)
RBC: 2.49 MIL/uL — ABNORMAL LOW (ref 4.22–5.81)
RDW: 19 % — ABNORMAL HIGH (ref 11.5–15.5)
WBC: 11.9 10*3/uL — ABNORMAL HIGH (ref 4.0–10.5)

## 2014-08-24 LAB — CBC WITH DIFFERENTIAL/PLATELET
BASOS PCT: 0 % (ref 0–1)
Basophils Absolute: 0 10*3/uL (ref 0.0–0.1)
Eosinophils Absolute: 0.1 10*3/uL (ref 0.0–0.7)
Eosinophils Relative: 1 % (ref 0–5)
HEMATOCRIT: 21.4 % — AB (ref 39.0–52.0)
HEMOGLOBIN: 7.4 g/dL — AB (ref 13.0–17.0)
LYMPHS ABS: 1.5 10*3/uL (ref 0.7–4.0)
Lymphocytes Relative: 11 % — ABNORMAL LOW (ref 12–46)
MCH: 29.4 pg (ref 26.0–34.0)
MCHC: 34.6 g/dL (ref 30.0–36.0)
MCV: 84.9 fL (ref 78.0–100.0)
MONOS PCT: 14 % — AB (ref 3–12)
Monocytes Absolute: 1.9 10*3/uL — ABNORMAL HIGH (ref 0.1–1.0)
NEUTROS ABS: 10.1 10*3/uL — AB (ref 1.7–7.7)
Neutrophils Relative %: 74 % (ref 43–77)
PLATELETS: 111 10*3/uL — AB (ref 150–400)
RBC: 2.52 MIL/uL — AB (ref 4.22–5.81)
RDW: 19.2 % — ABNORMAL HIGH (ref 11.5–15.5)
WBC: 13.6 10*3/uL — AB (ref 4.0–10.5)

## 2014-08-24 LAB — BODY FLUID CULTURE
Culture: NO GROWTH
Gram Stain: NONE SEEN

## 2014-08-24 LAB — AMMONIA: Ammonia: 58 umol/L — ABNORMAL HIGH (ref 11–32)

## 2014-08-24 MED ORDER — SODIUM CHLORIDE 0.9 % IV SOLN
INTRAVENOUS | Status: DC
  Administered 2014-08-24 – 2014-08-27 (×2): via INTRAVENOUS

## 2014-08-24 MED ORDER — LACTULOSE ENEMA
300.0000 mL | Freq: Two times a day (BID) | ORAL | Status: DC | PRN
  Filled 2014-08-24: qty 300

## 2014-08-24 MED ORDER — ZOLPIDEM TARTRATE 5 MG PO TABS
5.0000 mg | ORAL_TABLET | Freq: Once | ORAL | Status: AC
  Administered 2014-08-24: 5 mg via ORAL
  Filled 2014-08-24: qty 1

## 2014-08-24 MED ORDER — OXYCODONE HCL 5 MG PO TABS
5.0000 mg | ORAL_TABLET | Freq: Once | ORAL | Status: AC
  Administered 2014-08-24: 5 mg via ORAL
  Filled 2014-08-24: qty 1

## 2014-08-24 NOTE — Progress Notes (Signed)
[place] TEAM 1 - Stepdown/ICU TEAM Progress Note  Ulrick Methot OAC:[id number] DOB: 1955/05/19 DOA: 08/14/2014 PCP: No PCP Per Patient  Admit HPI / Brief Narrative: 59 y.o. male with a history of alcoholism, cirrhosis, coagulopathy, and polysubstance abuse who was discharged from the Hospitalist service on 07/28/2014 having undergone treatment for new onset ascites via paracentesis with removal of 2 L of fluid. He returned to the ED 4/17 with complaints of hematemesis over 3 days as well as bright red blood per rectum. He had several episodes of bloody/maroon liquid stools in the ED. He had a hemoglobin of 5.4 and platelet count of 98,000. INR 1.61 with PT of 19.3. Blood pressure was 125/74 with a pulse of 80, satting 100% on 2 L. Patient complained of abdominal pain located in the epigastric region. He takes Aleve as well as aspirin on a daily basis, and reported he had only had one beer since his last hospitalization.   HPI/Subjective: The patient's nurse notes that he has been very lethargic today.  This morning he required her sternal rub to wake up.  She states his mental status has gradually improved throughout the day.  In the time of my exam the patient will answer some questions and open his eyes when I ask him to but he quickly drifts back off to sleep.  He complains of "aching all over".  He is in no acute respiratory distress.  He does follow simple commands.  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 Noted via EGD - now s/p balloon-occluded transvenous obliteration per IR - is hemodynamically stable at present   Acute blood loss anemia Hemoglobin had stabilized but has dropped a bit as of this morning - we will recheck this afternoon - status post 4 units of packed red blood cells - transfuse as needed to keep Hgb 7.0 or >  Alcoholic and HCV cirrhosis with ascites Previously counseled patient as to the absolute need to discontinue all forms of alcohol immediately and completely and as to the likelihood of his early death should he choose to continue to drink - to f/u w/ GI after d/c   Thrombocytopenia  -Most likely secondary to cirrhosis - avoid all heparin products, NSAIDs - Platelet count continues to slowly improve  Acute on chronic renal failure (baseline creatinine 1.49 on 3/16)  -Creatinine appears to have stabilized near 2.2 though it has increased very slightly - this may be his new baseline - resume IV fluid due to very poor intake and follow I's/O's  FUO Patient is being covered empirically for SBP - blood cultures and urinalysis unrevealing as is paracentesis fluid culture - chest x-ray without acute findings - if persists we'll have to consider removing PICC line which appears to been placed 4/19  Coagulopathy Due to cirrhosis - appears to respond to vitamin K replacement  Hypernatremia  Resolved with administration of IV free water   Transaminitis  AST is climbing again - recheck in a.m.  HTN Blood pressure currently controlled  Obesity - Body mass index is 36.98 kg/(m^2).  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 rate 20, height 5\' 10"  (1.778 m), weight 116.9 kg (257 lb 11.5 oz), SpO2 97 %.  Intake/Output Summary (Last 24 hours) at 08/24/14 1635 Last data filed at 08/24/14 1500  Gross per 24 hour  Intake    600 ml  Output    200 ml  Net    400 ml   Exam: General: No acute respiratory distress - lethargic without focal neurologic deficits Lungs: Clear to auscultation bilaterally without wheezes or crackles Cardiovascular: Regular rate and rhythm without murmur gallop or rub Abdomen: Nontender throughout all 4 quadrants, protuberent, soft, bowel sounds positive, no rebound, obvious ascites Extremities: No significant cyanosis, or clubbing; 1+ edema bilateral lower extremities  Data Reviewed: Basic Metabolic Panel:  Recent Labs Lab 08/19/14 0450 08/20/14 0500 08/21/14 0548 08/22/14 0546 08/23/14 0617  NA 145 133* 138 140 138  K 3.5 3.5 3.6 3.7 3.8  CL 116* 106 108 108 106  CO2 24 23 25 24 23   GLUCOSE 117* 247* 103* 117* 100*  BUN 31* 29* 29* 32* 37*  CREATININE 2.41* 2.30* 2.11* 2.21* 2.33*  CALCIUM 7.6* 7.2* 7.4* 7.4* 7.6*    Liver Function Tests:  Recent Labs Lab 08/19/14 0450 08/20/14 0500 08/21/14 0548 08/22/14 0546 08/23/14 0617  AST 97* 88* 114* 133* 160*  ALT 48 41 42 45 52  ALKPHOS 44 45 55 47 57  BILITOT 5.6* 7.0* 8.7* 9.0* 8.5*  PROT 5.7* 5.2* 5.6* 6.3 6.8  ALBUMIN 1.8* 1.6* 1.6* 1.7* 1.6*   Coags:  Recent Labs Lab 08/18/14 0600 08/19/14 0450 08/23/14 0617  INR 1.43 1.50* 1.35   CBC:  Recent Labs Lab 08/20/14 0500 08/21/14 0548 08/22/14 0546 08/23/14 0617 08/24/14 0511  WBC 9.6 10.4 10.3 13.7* 13.6*    NEUTROABS 6.2 6.8 7.8* 10.4* 10.1*  HGB 7.3* 8.0* 8.1* 8.0* 7.4*  HCT 21.8* 23.1* 23.9* 23.2* 21.4*  MCV 86.9 85.6 85.1 83.8 84.9  PLT 56* 75* 86* 105* 111*     CBG:  Recent Labs Lab 08/17/14 2142 08/20/14 0833  GLUCAP 92 93    Recent Results (from the past 240 hour(s))  MRSA PCR Screening     Status: None   Collection Time: 08/15/14 12:24 AM  Result Value Ref Range Status   MRSA by PCR NEGATIVE NEGATIVE Final    Comment:        The GeneXpert MRSA Assay (FDA approved for NASAL specimens only), is one component of a comprehensive MRSA colonization surveillance program. It is not intended to diagnose MRSA infection nor to guide or monitor treatment for MRSA infections.   Culture, blood (routine x 2)     Status: None (Preliminary result)   Collection Time: 08/19/14  1:23 PM  Result Value Ref Range Status   Specimen Description BLOOD LEFT HAND  Final   Special Requests BOTTLES DRAWN AEROBIC AND ANAEROBIC 10CC 5CC  Final   Culture   Final           BLOOD CULTURE RECEIVED NO GROWTH TO DATE CULTURE WILL BE HELD FOR 5 DAYS BEFORE ISSUING A FINAL NEGATIVE REPORT Performed at [facility]    Report Status PENDING  Incomplete  Culture, blood (routine x 2)     Status: None (Preliminary result)   Collection Time: 08/19/14  1:30 PM  Result Value Ref Range Status   Specimen Description BLOOD LEFT HAND  Final   Special Requests BOTTLES DRAWN AEROBIC AND ANAEROBIC 10CC 5CC  Final   Culture   Final           BLOOD CULTURE RECEIVED NO GROWTH TO DATE CULTURE WILL BE HELD FOR 5 DAYS BEFORE ISSUING A FINAL NEGATIVE REPORT Performed at [facility]    Report Status PENDING  Incomplete  Culture, Urine     Status: None   Collection Time: 08/19/14  8:58 PM  Result Value Ref Range Status   Specimen Description URINE, CATHETERIZED  Final   Special Requests NONE  Final   Colony Count NO GROWTH Performed at [facility]   Final   Culture NO GROWTH Performed at  [facility]   Final   Report Status 08/21/2014 FINAL  Final  Anaerobic culture     Status: None (Preliminary result)   Collection Time: 08/20/14 10:41 AM  Result Value Ref Range Status   Specimen Description PERITONEAL CAVITY  Final   Special Requests NONE  Final   Gram Stain   Final    NO WBC SEEN NO SQUAMOUS EPITHELIAL CELLS SEEN NO ORGANISMS SEEN Performed at [facility]    Culture   Final    NO ANAEROBES ISOLATED; CULTURE IN PROGRESS FOR 5 DAYS Performed at [facility]    Report Status PENDING  Incomplete  Body fluid culture     Status: None   Collection Time: 08/20/14 10:41 AM  Result Value Ref Range Status   Specimen Description PERITONEAL CAVITY  Final   Special Requests NONE  Final   Gram Stain   Final    NO WBC SEEN NO ORGANISMS SEEN Performed at [facility]    Culture   Final    NO GROWTH 3 DAYS Performed at [facility]    Report Status 08/24/2014 FINAL  Final  Culture, blood (routine x 2)     Status: None (Preliminary result)   Collection Time: 08/21/14 11:39 AM  Result Value Ref Range Status   Specimen Description LEFT ANTECUBITAL  Final   Special Requests BOTTLES DRAWN AEROBIC ONLY  6CC  Final   Culture   Final           BLOOD CULTURE RECEIVED NO GROWTH TO DATE CULTURE WILL BE HELD FOR 5 DAYS BEFORE ISSUING A FINAL NEGATIVE REPORT Performed at [facility]    Report Status PENDING  Incomplete  Culture, blood (routine x 2)     Status: None (Preliminary result)   Collection Time: 08/21/14 11:49 AM  Result Value Ref Range Status   Specimen Description LEFT ANTECUBITAL  Final   Special Requests BOTTLES DRAWN AEROBIC ONLY  2CC  Final   Culture   Final           BLOOD CULTURE RECEIVED NO GROWTH TO DATE CULTURE WILL BE HELD FOR 5 DAYS BEFORE ISSUING A FINAL NEGATIVE REPORT Performed at [facility]    Report Status PENDING  Incomplete  Culture, Urine     Status: None   Collection Time: 08/21/14   2:04 PM  Result Value Ref Range Status   Specimen Description URINE, RANDOM  Final   Special Requests NONE  Final   Colony Count NO GROWTH Performed at [facility]   Final   Culture NO GROWTH Performed at [facility]   Final   Report Status 08/22/2014 FINAL  Final     Studies:   Recent x-ray studies have been reviewed in detail by the Attending Physician  Scheduled Meds:  Scheduled Meds: . antiseptic oral rinse  7 mL Mouth Rinse BID  . ciprofloxacin  400 mg Intravenous Q12H  . folic acid  1 mg Oral Daily  . furosemide  40 mg Intravenous Q12H  . lactulose  20 g Oral TID  . multivitamin with minerals  1 tablet Oral Daily  . pantoprazole  40 mg Oral Daily  . phytonadione  10 mg Oral Daily  . spironolactone  50 mg Oral Daily  . thiamine  100 mg Oral Daily    Time spent on care of this patient: 35 mins   MCCLUNG,JEFFREY T , MD   [facility] Office  [phone] Pager - Text Page per Shea Evans as per below:  On-Call/Text Page:      [email or web address]      password [email or web address]  If 7PM-7AM, please contact night-coverage [email or web address] Password [facility] 08/24/2014, 4:35 PM   LOS: 10 days

## 2014-08-24 NOTE — Evaluation (Signed)
Occupational Therapy Evaluation Patient Details Name: John [name] MRN: [id number] DOB: 1955/05/09 Today's Date: 08/24/2014    History of Present Illness 59 y.o. male with a history of alcoholism, cirrhosis, coagulopathy, and polysubstance abuse who was discharged from the Hospitalist service on 07/28/2014 having undergone treatment for new onset ascites via paracentesis with removal of 2 L of fluid. He returned to the ED 4/17 with complaints of hematemesis over 3 days as well as bright red blood per rectum.   Clinical Impression   Prior to admission, pt was independent in self care and mobility.  Presents with poor attention, generalized weakness, and impaired cognition.  He requires +2 assist for mobility and is dependent in all ADL.  Will follow acutely for trial of OT. Pt will require SNF upon d/c.    Follow Up Recommendations  SNF;Supervision/Assistance - 24 hour    Equipment Recommendations       Recommendations for Other Services       Precautions / Restrictions Precautions Precautions: Fall Restrictions Weight Bearing Restrictions: No      Mobility Bed Mobility Overal bed mobility: Needs Assistance;+2 for physical assistance Bed Mobility: Rolling Rolling: +2 for physical assistance;Max assist         General bed mobility comments: max multimodal cues for rolling, +2 total assist to position in bed for eating  Transfers                      Balance                                            ADL Overall ADL's : Needs assistance/impaired Eating/Feeding: Maximal assistance;Bed level   Grooming: Oral care;Bed level;Maximal assistance   Upper Body Bathing: Total assistance;Bed level   Lower Body Bathing: Total assistance;Bed level;+2 for physical assistance   Upper Body Dressing : Total assistance;Bed level   Lower Body Dressing: Total assistance;Bed level;+2 for physical assistance                 General ADL  Comments: Worked on bringing toothbrush to mouth and spoon and cup with straw to mouth,     Vision     Perception     Praxis      Pertinent Vitals/Pain Pain Assessment: No/denies pain     Hand Dominance Right   Extremity/Trunk Assessment Upper Extremity Assessment Upper Extremity Assessment: Generalized weakness   Lower Extremity Assessment Lower Extremity Assessment: Defer to PT evaluation       Communication Communication Communication: No difficulties   Cognition Arousal/Alertness: Awake/alert Behavior During Therapy: Flat affect Overall Cognitive Status: Impaired/Different from baseline Area of Impairment: Orientation;Attention;Memory;Following commands;Safety/judgement;Problem solving Orientation Level: Disoriented to;Time;Situation;Place (thinks he's in [place]) Current Attention Level: Focused Memory: Decreased short-term memory Following Commands: Follows one step commands inconsistently Safety/Judgement: Decreased awareness of safety;Decreased awareness of deficits   Problem Solving: Slow processing;Decreased initiation;Difficulty sequencing;Requires verbal cues;Requires tactile cues General Comments: Pt stating he feels bad today because of the party last night.   General Comments       Exercises       Shoulder Instructions      Home Living Family/patient expects to be discharged to:: Private residence Living Arrangements: Other (Comment) (niece) Available Help at Discharge: Family;Available PRN/intermittently Type of Home: House Home Access: Level entry     Home Layout: One level     Bathroom Shower/Tub:  Tub/shower unit         Home Equipment: Cane - single point          Prior Functioning/Environment Level of Independence: Independent             OT Diagnosis: Generalized weakness;Cognitive deficits   OT Problem List: Decreased strength;Decreased activity tolerance;Impaired balance (sitting and/or standing);Decreased  coordination;Decreased cognition;Obesity;Impaired UE functional use   OT Treatment/Interventions: Self-care/ADL training;DME and/or AE instruction;Therapeutic activities;Patient/family education;Cognitive remediation/compensation    OT Goals(Current goals can be found in the care plan section) Acute Rehab OT Goals Patient Stated Goal: did not state OT Goal Formulation: Patient unable to participate in goal setting Time For Goal Achievement: 09/07/14 Potential to Achieve Goals: Fair ADL Goals Pt Will Perform Eating: with min assist;sitting;bed level Pt Will Perform Grooming: with min assist;sitting;bed level Additional ADL Goal #1: Pt will follow one step commands with 50 % accuracy. Additional ADL Goal #2: Pt will roll with min assist to assist with linen changes and bathing.  OT Frequency: Min 2X/week   Barriers to D/C:            Co-evaluation              End of Session Nurse Communication: Other (comment) (lunch tray not dysphagia 1, fed only applesauce, mild, tea)  Activity Tolerance: Patient limited by lethargy Patient left: in bed;with call bell/phone within reach   Time: 1350-1417 OT Time Calculation (min): 27 min Charges:  OT General Charges $OT Visit: 1 Procedure OT Evaluation $Initial OT Evaluation Tier I: 1 Procedure OT Treatments $Self Care/Home Management : 8-22 mins G-Codes:    Malka So 08/24/2014, 2:42 PM [phone]

## 2014-08-24 NOTE — Progress Notes (Signed)
Speech Language Pathology Treatment: Dysphagia  Patient Details Name: John [name] MRN: [id number] DOB: [birth year] Today's Date: 08/24/2014 Time: 0945-1000 SLP Time Calculation (min) (ACUTE ONLY): 15 min  Assessment / Plan / Recommendation Clinical Impression  Pt required maximal stim for appropriate arousal for am meal, able to sustain eyes open and follow commands, but still very confused. Pt tolerated straw sips of thin liquids without evidence of aspiration, though there was some labial spillage with cup sips. Severe oral holding with solids required removal of bolus. Pt will need to be downgraded to pureed solids to avoid oral holding, as well as taking meds crushed in puree. SLP to f/u for upgrade/tolerance.    HPI HPI: 59 y.o. BM PMHx Alcoholism, Alcoholic Liver Cirrhosis, coagulopathy, Polysubstance Abuse, accelerated hypertension, thrombocytopenia, who was recently discharged from the hospitalist service on 07/28/2014 at which time he was treated for new onset ascites undergoing paracentesis with removal of 2 L of ascitic fluid.  Admitted 4/17 with UGI bleed; acute blood loss anemia/gastric verices bleeding; renal failure; metabolic encephalopathy.    Pertinent Vitals    SLP Plan  Continue with current plan of care    Recommendations Diet recommendations: Dysphagia 1 (puree);Thin liquid Liquids provided via: Cup;Straw Medication Administration: Crushed with puree Supervision: Staff to assist with self feeding;Full supervision/cueing for compensatory strategies Compensations: Slow rate;Small sips/bites;Check for pocketing Postural Changes and/or Swallow Maneuvers: Seated upright 90 degrees              Oral Care Recommendations: Oral care BID Follow up Recommendations: Skilled Nursing facility Plan: Continue with current plan of care    GO    [facility], MA CCC-SLP [phone]  [name] 08/24/2014, 12:11 PM

## 2014-08-25 DIAGNOSIS — R509 Fever, unspecified: Secondary | ICD-10-CM | POA: Diagnosis present

## 2014-08-25 LAB — COMPREHENSIVE METABOLIC PANEL
ALT: 52 U/L (ref 0–53)
AST: 167 U/L — AB (ref 0–37)
Albumin: 1.5 g/dL — ABNORMAL LOW (ref 3.5–5.2)
Alkaline Phosphatase: 55 U/L (ref 39–117)
Anion gap: 9 (ref 5–15)
BILIRUBIN TOTAL: 6.8 mg/dL — AB (ref 0.3–1.2)
BUN: 53 mg/dL — ABNORMAL HIGH (ref 6–23)
CHLORIDE: 103 mmol/L (ref 96–112)
CO2: 22 mmol/L (ref 19–32)
Calcium: 7.4 mg/dL — ABNORMAL LOW (ref 8.4–10.5)
Creatinine, Ser: 3.23 mg/dL — ABNORMAL HIGH (ref 0.50–1.35)
GFR calc Af Amer: 23 mL/min — ABNORMAL LOW (ref >90)
GFR calc non Af Amer: 20 mL/min — ABNORMAL LOW (ref >90)
Glucose, Bld: 86 mg/dL (ref 70–99)
Potassium: 4.4 mmol/L (ref 3.5–5.1)
Sodium: 134 mmol/L — ABNORMAL LOW (ref 135–145)
Total Protein: 6.7 g/dL (ref 6.0–8.3)

## 2014-08-25 LAB — ANAEROBIC CULTURE: Gram Stain: NONE SEEN

## 2014-08-25 LAB — CULTURE, BLOOD (ROUTINE X 2): Culture: NO GROWTH

## 2014-08-25 LAB — AMMONIA: Ammonia: 52 umol/L — ABNORMAL HIGH (ref 11–32)

## 2014-08-25 MED ORDER — CIPROFLOXACIN IN D5W 400 MG/200ML IV SOLN
400.0000 mg | INTRAVENOUS | Status: DC
  Administered 2014-08-26 – 2014-08-29 (×4): 400 mg via INTRAVENOUS
  Filled 2014-08-25 (×5): qty 200

## 2014-08-25 MED ORDER — FUROSEMIDE 40 MG PO TABS
60.0000 mg | ORAL_TABLET | Freq: Once | ORAL | Status: AC
  Administered 2014-08-25: 60 mg via ORAL
  Filled 2014-08-25: qty 1

## 2014-08-25 NOTE — Progress Notes (Signed)
Physical Therapy Treatment Patient Details Name: John [name] MRN: [id number] DOB: 1956/04/20 Today's Date: 08/25/2014    History of Present Illness 59 y.o. male with a history of alcoholism, cirrhosis, coagulopathy, and polysubstance abuse who was discharged from the Hospitalist service on 07/28/2014 having undergone treatment for new onset ascites via paracentesis with removal of 2 L of fluid. He returned to the ED 4/17 with complaints of hematemesis over 3 days as well as bright red blood per rectum    PT Comments    Pt admitted with above diagnosis. Pt currently with functional limitations due to balance and endurance deficits as well as weakness.  Pt continues to have difficulty sitting EOB.  Pt did well with use of Sara Plus for standing.  Progressing with mobility.  Continue PT.   Pt will benefit from skilled PT to increase their independence and safety with mobility to allow discharge to the venue listed below.    Follow Up Recommendations  SNF;Supervision/Assistance - 24 hour     Equipment Recommendations  Rolling walker with 5" wheels;3in1 (PT)    Recommendations for Other Services       Precautions / Restrictions Precautions Precautions: Fall Restrictions Weight Bearing Restrictions: No    Mobility  Bed Mobility Overal bed mobility: Needs Assistance;+2 for physical assistance Bed Mobility: Supine to Sit     Supine to sit: Max assist;+2 for physical assistance;HOB elevated     General bed mobility comments: max multimodal cues for supine to sit with assist to move LEs and for elevation of trunk.  Used pad to scoot pt out to bed.    Transfers Overall transfer level: Needs assistance Equipment used: Rolling walker (2 wheeled);Ambulation equipment used Clarise Cruz Plus) Transfers: Sit to/from [facility] Sit to Stand: Mod assist;+2 safety/equipment Stand pivot transfers: Min assist;+2 safety/equipment       General transfer comment: Attempted to  stand to RW without success without pt able to clear bottom.  Obtained Clarise Cruz Plus and pt was able to stand once positioned in the equipment.  Used Clarise Cruz Plus for stand pivot transfer with good results with pt able to stand on footplate for pivot transfer without physical assist.  Pt also weight shifting bil directions.    Ambulation/Gait                 Stairs            Wheelchair Mobility    Modified Rankin (Stroke Patients Only)       Balance Overall balance assessment: Needs assistance;History of Falls Sitting-balance support: Bilateral upper extremity supported;Feet supported Sitting balance-Leahy Scale: Poor Sitting balance - Comments: Pt with posterior lean and constant assist needed to maintain balance sitting EOB.  total to mod assist for 15 min at EOB.   Postural control: Posterior lean Standing balance support: Bilateral upper extremity supported;During functional activity Standing balance-Leahy Scale: Poor Standing balance comment: Pt could not stand without Sara Plus.  Pt was able to stand with Clarise Cruz Plus with footplate and knee pad for 3 minutes with good postural stability.                      Cognition Arousal/Alertness: Awake/alert Behavior During Therapy: Flat affect Overall Cognitive Status: Impaired/Different from baseline Area of Impairment: Orientation;Attention;Memory;Following commands;Safety/judgement;Problem solving Orientation Level: Disoriented to;Time;Situation Current Attention Level: Focused Memory: Decreased short-term memory Following Commands: Follows one step commands inconsistently Safety/Judgement: Decreased awareness of safety;Decreased awareness of deficits   Problem Solving: Slow processing;Decreased initiation;Difficulty sequencing;Requires  verbal cues;Requires tactile cues      Exercises General Exercises - Lower Extremity Ankle Circles/Pumps: AROM;5 reps;Both;Supine Long Arc Quad: AROM;Both;5 reps;Seated     General Comments        Pertinent Vitals/Pain Pain Assessment: Faces Faces Pain Scale: Hurts even more Pain Location: all over per pt Pain Descriptors / Indicators: Aching Pain Intervention(s): Limited activity within patient's tolerance;Monitored during session;Repositioned  78-96 bpm, 95% O2 on RA, BP 125/75 down to 103/68 once pt in chair asymptomatic.      Home Living                      Prior Function            PT Goals (current goals can now be found in the care plan section) Progress towards PT goals: Progressing toward goals    Frequency  Min 3X/week    PT Plan Current plan remains appropriate    Co-evaluation             End of Session Equipment Utilized During Treatment: Gait belt;Other (comment) Clarise Cruz Plus) Activity Tolerance: Patient limited by fatigue Patient left: in chair;with call bell/phone within reach     Time: 1024-1104 PT Time Calculation (min) (ACUTE ONLY): 40 min  Charges:  $Gait Training: 8-22 mins $Therapeutic Activity: 23-37 mins                    G CodesDenice Paradise 2014-09-10, 1:36 PM [facility] [facility] [phone] (pager)

## 2014-08-25 NOTE — Progress Notes (Signed)
[facility] TEAM 1 - Stepdown/ICU TEAM Progress Note  Yusef Lamp AGT:[id number] DOB: 04/09/1956 DOA: 08/14/2014 PCP: No PCP Per Patient  Admit HPI / Brief Narrative: John [name] is a 59 y.o. BM PMHx Alcoholism, Alcoholic Liver Cirrhosis, coagulopathy, Polysubstance Abuse, accelerated hypertension, thrombocytopenia, who was recently discharged from the hospitalist service on 07/28/2014 at which time he was treated for new onset ascites undergoing paracentesis with removal of 2 L of ascitic fluid.  Presents to the emergency department with complaints of hematemesis over the past 3 days as well as bright red blood per rectum. He was noted have several episodes of bloody/maroon colored liquid consistency stools in the emergency department. CBC showed a hemoglobin of 5.4 with hematocrit of 16 and platelet count of 98,000. He has an INR 1.61 with PT of 19.3. He was urgently typed and crossed and received his first unit of packed red blood cells while in the emergency department. Last blood pressure reading was 125/74 with a pulse of 80, satting 100% on 2 L up in oxygen via nasal cannula. Patient also complains of abdominal pain located in the epigastric region. He takes Aleve as well as aspirin on a daily basis, and tells me he has only had one beer since his last hospitalization. I spoke with Dr. Fuller Plan of GI from the emergency department.   HPI/Subjective: 4/ 28 A/O 4 sitting in chair comfortably   Assessment/Plan: Upper GI bleed.  -4/18 S/P Balloon-occluded Retrograde Transvenous Obliteration (BRTO) by GI with balloon removed today. -Patient to remain nothing by mouth except for sips with medication my: Explain this to the patient. -Hemoglobin stable - 4/18 transfused 3 units PRBC   -4/26 stable -Protonix 40 mg daily  Acute blood loss anemia/gastric varices bleeding -See upper GI bleed  Alcohol-induced liver cirrhosis with ascites/HCV cirrhosis -Last month  paracentesis; 2 L fluid removed -Patient's abdomen still distended softer post paracentesis. -4/23 US guided paracentesis removed 1.7 L; fluid sent for stain, culture, and cytology  Thrombocytopenia  -Most likely secondary to alcohol liver cirrhosis -Will avoid all heparin products, NSAIDs -Stable  Acute on chronic renal failure (baseline 1.49 on 3/16)  -Trending trending up -Patient is euvolemic CVP = 8 , top end of normal and with rising creatinine will give one dose of Lasix 60 mg 1 -Strict in and out; since admission + 4.9 L  Elevated transaminases. - consistent with alcoholic hepatitis.   Chronic alcoholism. - Continue CIWA protocol (4/17 alcohol negative)  Hypertension.  -Stable, continue monitor  Left ventricle hypertrophy/diastolic dysfunction  FUO -4/28 afebrile.  -Continue Ciprofloxacin for empiric SBP coverage SBP  Metabolic encephalopathy -Continue to hold all sedating medication to include morphine, Zofran, oxycodone -Out of bed to chair q shift -PT/OT consult placed for placement   Coagulopathy -Due to cirrhosis check INR in a.m.    Code Status: FULL Family Communication: no family present at time of exam Disposition Plan: Per GI; SNF    Consultants: Dr.Malcolm T Fuller Plan (GI) Dr.Jaime Wagner (IR)   Procedure/Significant Events: 3/31 echocardiogram;Left ventricle: moderate concentric hypertrophy.-LVEF= 60% to 65%. -(grade 1 diastolic dysfunction).-  4/18 transfused 3 units PRBC 4/18 S/P Balloon-occluded Retrograde Transvenous Obliteration (BRTO) 4/22 PCXR;Small left pleural effusion. Left perihilar and retrocardiac opacity may reflect atelectasis, pneumonia, or perhaps asymmetric edema 4/23 US guided paracentesis removed 1.7 L  Culture 4/18 MRSA by PCR negative 4/22 blood left hand 2 NGTD 4/22 urine negative 4/23 peritoneal fluid NGTD  4/24 blood left antecubital 2 NGTD 4/24 urine negative   Antibiotics:  Ciprofloxacin 4/17>> stopped  4/21 Ciprofloxacin 4/25>>  DVT prophylaxis: SCD   Devices    LINES / TUBES:  Double-lumen PICC 4/19>>    Continuous Infusions: . sodium chloride 50 mL/hr at 08/25/14 0000    Objective: VITAL SIGNS: Temp: 98.6 F (37 C) (04/28 2000) Temp Source: Oral (04/28 2000) BP: 129/79 mmHg (04/28 2000) Pulse Rate: 80 (04/28 2000) SPO2; FIO2:   Intake/Output Summary (Last 24 hours) at 08/25/14 2207 Last data filed at 08/25/14 2100  Gross per 24 hour  Intake   1680 ml  Output   1225 ml  Net    455 ml     Exam: General: A/O 4, sitting in chair comfortably, follows all commands No acute respiratory distress Lungs:  air movement in all lobes, mild rhonchi ,without wheezes or crackles Cardiovascular: Regular rate and rhythm without murmur gallop or rub normal S1 and S2 Abdomen: Nontender to palpation, distended, firm, bowel sounds positive, no rebound, no ascites, no appreciable mass, negative fluid wave Extremities: No significant cyanosis, clubbing, or edema bilateral lower extremities  Data Reviewed: Basic Metabolic Panel:  Recent Labs Lab 08/20/14 0500 08/21/14 0548 08/22/14 0546 08/23/14 0617 08/25/14 0538  NA 133* 138 140 138 134*  K 3.5 3.6 3.7 3.8 4.4  CL 106 108 108 106 103  CO2 23 25 24 23 22   GLUCOSE 247* 103* 117* 100* 86  BUN 29* 29* 32* 37* 53*  CREATININE 2.30* 2.11* 2.21* 2.33* 3.23*  CALCIUM 7.2* 7.4* 7.4* 7.6* 7.4*   Liver Function Tests:  Recent Labs Lab 08/20/14 0500 08/21/14 0548 08/22/14 0546 08/23/14 0617 08/25/14 0538  AST 88* 114* 133* 160* 167*  ALT 41 42 45 52 52  ALKPHOS 45 55 47 57 55  BILITOT 7.0* 8.7* 9.0* 8.5* 6.8*  PROT 5.2* 5.6* 6.3 6.8 6.7  ALBUMIN 1.6* 1.6* 1.7* 1.6* 1.5*   No results for input(s): LIPASE, AMYLASE in the last 168 hours.  Recent Labs Lab 08/21/14 1455 08/22/14 0615 08/23/14 0500 08/24/14 1853 08/25/14 0745  AMMONIA 49* 72* 58* 58* 52*   CBC:  Recent Labs Lab 08/20/14 0500 08/21/14 0548  08/22/14 0546 08/23/14 0617 08/24/14 0511 08/24/14 1830  WBC 9.6 10.4 10.3 13.7* 13.6* 11.9*  NEUTROABS 6.2 6.8 7.8* 10.4* 10.1*  --   HGB 7.3* 8.0* 8.1* 8.0* 7.4* 7.1*  HCT 21.8* 23.1* 23.9* 23.2* 21.4* 20.9*  MCV 86.9 85.6 85.1 83.8 84.9 83.9  PLT 56* 75* 86* 105* 111* 112*   Cardiac Enzymes: No results for input(s): CKTOTAL, CKMB, CKMBINDEX, TROPONINI in the last 168 hours. BNP (last 3 results)  Recent Labs  06/24/14 0802  BNP 1168.5*    ProBNP (last 3 results) No results for input(s): PROBNP in the last 8760 hours.  CBG:  Recent Labs Lab 08/20/14 0833  GLUCAP 93    Recent Results (from the past 240 hour(s))  Culture, blood (routine x 2)     Status: None   Collection Time: 08/19/14  1:23 PM  Result Value Ref Range Status   Specimen Description BLOOD LEFT HAND  Final   Special Requests   Final    BOTTLES DRAWN AEROBIC AND ANAEROBIC 10CC BLUE 5CC RED   Culture   Final    NO GROWTH 5 DAYS Performed at [facility]    Report Status 08/25/2014 FINAL  Final  Culture, blood (routine x 2)     Status: None (Preliminary result)   Collection Time: 08/19/14  1:30 PM  Result Value Ref Range Status  Specimen Description BLOOD LEFT HAND  Final   Special Requests BOTTLES DRAWN AEROBIC AND ANAEROBIC 10CC 5CC  Final   Culture   Final           BLOOD CULTURE RECEIVED NO GROWTH TO DATE CULTURE WILL BE HELD FOR 5 DAYS BEFORE ISSUING A FINAL NEGATIVE REPORT Performed at [facility]    Report Status PENDING  Incomplete  Culture, Urine     Status: None   Collection Time: 08/19/14  8:58 PM  Result Value Ref Range Status   Specimen Description URINE, CATHETERIZED  Final   Special Requests NONE  Final   Colony Count NO GROWTH Performed at [facility]   Final   Culture NO GROWTH Performed at [facility]   Final   Report Status 08/21/2014 FINAL  Final  Anaerobic culture     Status: None   Collection Time: 08/20/14 10:41 AM  Result Value  Ref Range Status   Specimen Description PERITONEAL CAVITY  Final   Special Requests NONE  Final   Gram Stain   Final    NO WBC SEEN NO SQUAMOUS EPITHELIAL CELLS SEEN NO ORGANISMS SEEN Performed at [facility]    Culture   Final    NO ANAEROBES ISOLATED Performed at [facility]    Report Status 08/25/2014 FINAL  Final  Body fluid culture     Status: None   Collection Time: 08/20/14 10:41 AM  Result Value Ref Range Status   Specimen Description PERITONEAL CAVITY  Final   Special Requests NONE  Final   Gram Stain   Final    NO WBC SEEN NO ORGANISMS SEEN Performed at [facility]    Culture   Final    NO GROWTH 3 DAYS Performed at [facility]    Report Status 08/24/2014 FINAL  Final  Culture, blood (routine x 2)     Status: None (Preliminary result)   Collection Time: 08/21/14 11:39 AM  Result Value Ref Range Status   Specimen Description LEFT ANTECUBITAL  Final   Special Requests BOTTLES DRAWN AEROBIC ONLY  6CC  Final   Culture   Final           BLOOD CULTURE RECEIVED NO GROWTH TO DATE CULTURE WILL BE HELD FOR 5 DAYS BEFORE ISSUING A FINAL NEGATIVE REPORT Performed at [facility]    Report Status PENDING  Incomplete  Culture, blood (routine x 2)     Status: None (Preliminary result)   Collection Time: 08/21/14 11:49 AM  Result Value Ref Range Status   Specimen Description LEFT ANTECUBITAL  Final   Special Requests BOTTLES DRAWN AEROBIC ONLY  2CC  Final   Culture   Final           BLOOD CULTURE RECEIVED NO GROWTH TO DATE CULTURE WILL BE HELD FOR 5 DAYS BEFORE ISSUING A FINAL NEGATIVE REPORT Performed at [facility]    Report Status PENDING  Incomplete  Culture, Urine     Status: None   Collection Time: 08/21/14  2:04 PM  Result Value Ref Range Status   Specimen Description URINE, RANDOM  Final   Special Requests NONE  Final   Colony Count NO GROWTH Performed at [facility]   Final   Culture NO  GROWTH Performed at [facility]   Final   Report Status 08/22/2014 FINAL  Final     Studies:  Recent x-ray studies have been reviewed in detail  by the Attending Physician  Scheduled Meds:  Scheduled Meds: . antiseptic oral rinse  7 mL Mouth Rinse BID  . [START ON 08/26/2014] ciprofloxacin  400 mg Intravenous Q24H  . folic acid  1 mg Oral Daily  . furosemide  60 mg Oral Once  . lactulose  20 g Oral TID  . multivitamin with minerals  1 tablet Oral Daily  . pantoprazole  40 mg Oral Daily  . phytonadione  10 mg Oral Daily  . thiamine  100 mg Oral Daily    Time spent on care of this patient: 40 mins   Corleone Biegler, Geraldo Docker , MD  [facility] Office  [phone] Pager [phone]  On-Call/Text Page:      [email or web address]      password [email or web address]  If 7PM-7AM, please contact night-coverage [email or web address] Password [email or web address] 08/25/2014, 10:07 PM   LOS: 11 days   Care during the described time interval was provided by me .  I have reviewed this patient's available data, including medical history, events of note, physical examination, radiology studies and test results as part of my evaluation  Dia Crawford, MD [phone] Pager

## 2014-08-26 LAB — BASIC METABOLIC PANEL
Anion gap: 8 (ref 5–15)
BUN: 57 mg/dL — ABNORMAL HIGH (ref 6–23)
CO2: 22 mmol/L (ref 19–32)
Calcium: 7.2 mg/dL — ABNORMAL LOW (ref 8.4–10.5)
Chloride: 105 mmol/L (ref 96–112)
Creatinine, Ser: 2.99 mg/dL — ABNORMAL HIGH (ref 0.50–1.35)
GFR calc non Af Amer: 22 mL/min — ABNORMAL LOW (ref >90)
GFR, EST AFRICAN AMERICAN: 25 mL/min — AB (ref >90)
Glucose, Bld: 98 mg/dL (ref 70–99)
POTASSIUM: 4 mmol/L (ref 3.5–5.1)
Sodium: 135 mmol/L (ref 135–145)

## 2014-08-26 LAB — CULTURE, BLOOD (ROUTINE X 2): Culture: NO GROWTH

## 2014-08-26 LAB — CBC WITH DIFFERENTIAL/PLATELET
BASOS ABS: 0 10*3/uL (ref 0.0–0.1)
BASOS PCT: 0 % (ref 0–1)
EOS ABS: 0.1 10*3/uL (ref 0.0–0.7)
Eosinophils Relative: 1 % (ref 0–5)
HEMATOCRIT: 19.7 % — AB (ref 39.0–52.0)
Hemoglobin: 6.9 g/dL — CL (ref 13.0–17.0)
LYMPHS ABS: 1.8 10*3/uL (ref 0.7–4.0)
LYMPHS PCT: 14 % (ref 12–46)
MCH: 30 pg (ref 26.0–34.0)
MCHC: 35 g/dL (ref 30.0–36.0)
MCV: 85.7 fL (ref 78.0–100.0)
MONO ABS: 1.8 10*3/uL — AB (ref 0.1–1.0)
Monocytes Relative: 14 % — ABNORMAL HIGH (ref 3–12)
Neutro Abs: 8.8 10*3/uL — ABNORMAL HIGH (ref 1.7–7.7)
Neutrophils Relative %: 71 % (ref 43–77)
Platelets: 115 10*3/uL — ABNORMAL LOW (ref 150–400)
RBC: 2.3 MIL/uL — ABNORMAL LOW (ref 4.22–5.81)
RDW: 20.1 % — ABNORMAL HIGH (ref 11.5–15.5)
WBC: 12.5 10*3/uL — ABNORMAL HIGH (ref 4.0–10.5)

## 2014-08-26 LAB — PROTIME-INR
INR: 1.54 — ABNORMAL HIGH (ref 0.00–1.49)
Prothrombin Time: 18.6 seconds — ABNORMAL HIGH (ref 11.6–15.2)

## 2014-08-26 LAB — AMMONIA: Ammonia: 61 umol/L — ABNORMAL HIGH (ref 11–32)

## 2014-08-26 LAB — PREPARE RBC (CROSSMATCH)

## 2014-08-26 MED ORDER — SODIUM CHLORIDE 0.9 % IV SOLN
Freq: Once | INTRAVENOUS | Status: AC
  Administered 2014-08-26: 10 mL/h via INTRAVENOUS

## 2014-08-26 MED ORDER — FOLIC ACID 5 MG/ML IJ SOLN
1.0000 mg | Freq: Every day | INTRAMUSCULAR | Status: DC
  Administered 2014-08-26 – 2014-08-28 (×3): 1 mg via INTRAVENOUS
  Filled 2014-08-26 (×3): qty 0.2

## 2014-08-26 MED ORDER — THIAMINE HCL 100 MG/ML IJ SOLN
100.0000 mg | Freq: Every day | INTRAMUSCULAR | Status: DC
  Administered 2014-08-26 – 2014-08-28 (×3): 100 mg via INTRAVENOUS
  Filled 2014-08-26 (×3): qty 1

## 2014-08-26 MED ORDER — LACTULOSE ENEMA
300.0000 mL | Freq: Two times a day (BID) | ORAL | Status: DC
  Administered 2014-08-26 – 2014-08-27 (×2): 300 mL via RECTAL
  Filled 2014-08-26 (×5): qty 300

## 2014-08-26 NOTE — Progress Notes (Signed)
[place] TEAM 1 - Stepdown/ICU TEAM Progress Note  John [name] NLG:[id number] DOB: 1955-05-20 DOA: 08/14/2014 PCP: No PCP Per Patient  Admit HPI / Brief Narrative: 59 y.o. male with a history of alcoholism, cirrhosis, coagulopathy, and polysubstance abuse who was discharged from the Hospitalist service on 07/28/2014 having undergone treatment for new onset ascites via paracentesis with removal of 2 L of fluid. He returned to the ED 4/17 with complaints of hematemesis over 3 days as well as bright red blood per rectum. He had several episodes of bloody/maroon liquid stools in the ED. He had a hemoglobin of 5.4 and platelet count of 98,000. INR 1.61 with PT of 19.3. Blood pressure was 125/74 with a pulse of 80, satting 100% on 2 L. Patient complained of abdominal pain located in the epigastric region. He takes Aleve as well as aspirin on a daily basis, and reported he had only had one beer since his last hospitalization.   HPI/Subjective: The patient remains lethargic.  He cannot stay awake long enough to provide a reliable history.  There is no family in the room at the time of visit.  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 Noted via EGD - now s/p balloon-occluded transvenous obliteration per IR - is hemodynamically stable at present   Acute blood loss anemia Hemoglobin had stabilized but has  dropped a bit over the last 48 hours - he is being transfused one additional unit today - therefore he is now status post 5 units of packed red blood cells - transfuse as needed to keep Hgb 7.0 or >  Alcoholic and HCV cirrhosis with ascites Previously counseled patient as to the absolute need to discontinue all forms of alcohol immediately and completely and as to the likelihood of his early death should he choose to continue to drink - to f/u w/ GI after d/c   Thrombocytopenia  -Most likely secondary to cirrhosis - avoid all heparin products, NSAIDs - Platelet count continues to slowly improve  Acute on chronic renal failure (baseline creatinine 1.49 on 3/16)  -Creatinine has improved slightly with resumption of IV fluid - continue to follow trend  FUO -Patient has had no further fevers since empiric treatment initiated for SBP - blood cultures and urinalysis unrevealing as is paracentesis fluid culture - chest x-ray without acute findings   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  HTN Blood pressure currently controlled  Obesity - Body mass index is 37.58 kg/(m^2).  Code Status: FULL Family Communication: no family present at time of exam Disposition Plan: Stepdown unit  Consultants: GI [facility] IR   Procedures: 4/17 - EGD - Large gastric cardia varices with superficial ulcer 4/18 - BRTO of the gastric varices with STS sclerotherapy and coils 4/19 - Successful fluoroscopic guided removal of occlusion balloon and vascular sheath following BRTO procedure  4/23 US guided paracentesis - 2L removed   Antibiotics:  Cipro 4/17 > 4/21 + 4/25 >  DVT prophylaxis: SCDs  Objective: Blood pressure 132/75, pulse 84, temperature 98.7 F (37.1 C), temperature source Axillary, resp. rate 27, height 5\' 10"  (1.778 m), weight 118.8 kg (261 lb 14.5 oz),  SpO2 99 %.  Intake/Output Summary (Last 24 hours) at 08/26/14 1812 Last data filed at 08/26/14 1638  Gross per 24 hour  Intake   2162 ml  Output   1051 ml  Net   1111 ml   Exam: General: lethargic without focal neurologic deficits - no acute distress Lungs: Clear to auscultation bilaterally without wheezes or crackles - poor air movement in bilateral bases Cardiovascular: Regular rate and rhythm without murmur gallop or rub Abdomen: Nontender throughout all 4 quadrants, protuberent with obvious ascites, soft, bowel sounds positive, no rebound, obvious ascites Extremities: No significant cyanosis, or clubbing; 1+ edema bilateral lower extremities  Data Reviewed: Basic Metabolic Panel:  Recent Labs Lab 08/21/14 0548 08/22/14 0546 08/23/14 0617 08/25/14 0538 08/26/14 0500  NA 138 140 138 134* 135  K 3.6 3.7 3.8 4.4 4.0  CL 108 108 106 103 105  CO2 25 24 23 22 22   GLUCOSE 103* 117* 100* 86 98  BUN 29* 32* 37* 53* 57*  CREATININE 2.11* 2.21* 2.33* 3.23* 2.99*  CALCIUM 7.4* 7.4* 7.6* 7.4* 7.2*    Liver Function Tests:  Recent Labs Lab 08/20/14 0500 08/21/14 0548 08/22/14 0546 08/23/14 0617 08/25/14 0538  AST 88* 114* 133* 160* 167*  ALT 41 42 45 52 52  ALKPHOS 45 55 47 57 55  BILITOT 7.0* 8.7* 9.0* 8.5* 6.8*  PROT 5.2* 5.6* 6.3 6.8 6.7  ALBUMIN 1.6* 1.6* 1.7* 1.6* 1.5*   Coags:  Recent Labs Lab 08/23/14 0617 08/26/14 0500  INR 1.35 1.54*   CBC:  Recent Labs Lab 08/21/14 0548 08/22/14 0546 08/23/14 0617 08/24/14 0511 08/24/14 1830 08/26/14 0500  WBC 10.4 10.3 13.7* 13.6* 11.9* 12.5*  NEUTROABS 6.8 7.8* 10.4* 10.1*  --  8.8*  HGB 8.0* 8.1* 8.0* 7.4* 7.1* 6.9*  HCT 23.1* 23.9* 23.2* 21.4* 20.9* 19.7*  MCV 85.6 85.1 83.8 84.9 83.9 85.7  PLT 75* 86* 105* 111* 112* 115*     CBG:  Recent Labs Lab 08/20/14 0833  GLUCAP 93    Recent Results (from the past 240 hour(s))  Culture, blood (routine x 2)     Status: None   Collection Time: 08/19/14   1:23 PM  Result Value Ref Range Status   Specimen Description BLOOD LEFT HAND  Final   Special Requests   Final    BOTTLES DRAWN AEROBIC AND ANAEROBIC 10CC BLUE 5CC RED   Culture   Final    NO GROWTH 5 DAYS Performed at [facility]    Report Status 08/25/2014 FINAL  Final  Culture, blood (routine x 2)     Status: None   Collection Time: 08/19/14  1:30 PM  Result Value Ref Range Status   Specimen Description BLOOD LEFT HAND  Final   Special Requests   Final    BOTTLES DRAWN AEROBIC AND ANAEROBIC 10CC BLUE 5CC RED   Culture   Final    NO GROWTH 5 DAYS Performed at [facility]    Report Status 08/26/2014 FINAL  Final  Culture, Urine     Status: None   Collection Time: 08/19/14  8:58 PM  Result Value Ref Range Status   Specimen Description URINE, CATHETERIZED  Final   Special Requests NONE  Final  Colony Count NO GROWTH Performed at [facility]   Final   Culture NO GROWTH Performed at [facility]   Final   Report Status 08/21/2014 FINAL  Final  Anaerobic culture     Status: None   Collection Time: 08/20/14 10:41 AM  Result Value Ref Range Status   Specimen Description PERITONEAL CAVITY  Final   Special Requests NONE  Final   Gram Stain   Final    NO WBC SEEN NO SQUAMOUS EPITHELIAL CELLS SEEN NO ORGANISMS SEEN Performed at [facility]    Culture   Final    NO ANAEROBES ISOLATED Performed at [facility]    Report Status 08/25/2014 FINAL  Final  Body fluid culture     Status: None   Collection Time: 08/20/14 10:41 AM  Result Value Ref Range Status   Specimen Description PERITONEAL CAVITY  Final   Special Requests NONE  Final   Gram Stain   Final    NO WBC SEEN NO ORGANISMS SEEN Performed at [facility]    Culture   Final    NO GROWTH 3 DAYS Performed at [facility]    Report Status 08/24/2014 FINAL  Final  Culture, blood (routine x 2)     Status: None (Preliminary result)   Collection  Time: 08/21/14 11:39 AM  Result Value Ref Range Status   Specimen Description LEFT ANTECUBITAL  Final   Special Requests BOTTLES DRAWN AEROBIC ONLY  6CC  Final   Culture   Final           BLOOD CULTURE RECEIVED NO GROWTH TO DATE CULTURE WILL BE HELD FOR 5 DAYS BEFORE ISSUING A FINAL NEGATIVE REPORT Performed at [facility]    Report Status PENDING  Incomplete  Culture, blood (routine x 2)     Status: None (Preliminary result)   Collection Time: 08/21/14 11:49 AM  Result Value Ref Range Status   Specimen Description LEFT ANTECUBITAL  Final   Special Requests BOTTLES DRAWN AEROBIC ONLY  2CC  Final   Culture   Final           BLOOD CULTURE RECEIVED NO GROWTH TO DATE CULTURE WILL BE HELD FOR 5 DAYS BEFORE ISSUING A FINAL NEGATIVE REPORT Performed at [facility]    Report Status PENDING  Incomplete  Culture, Urine     Status: None   Collection Time: 08/21/14  2:04 PM  Result Value Ref Range Status   Specimen Description URINE, RANDOM  Final   Special Requests NONE  Final   Colony Count NO GROWTH Performed at [facility]   Final   Culture NO GROWTH Performed at [facility]   Final   Report Status 08/22/2014 FINAL  Final     Studies:   Recent x-ray studies have been reviewed in detail by the Attending Physician  Scheduled Meds:  Scheduled Meds: . antiseptic oral rinse  7 mL Mouth Rinse BID  . ciprofloxacin  400 mg Intravenous Q24H  . folic acid  1 mg Oral Daily  . lactulose  20 g Oral TID  . multivitamin with minerals  1 tablet Oral Daily  . pantoprazole  40 mg Oral Daily  . phytonadione  10 mg Oral Daily  . thiamine  100 mg Oral Daily    Time spent on care of this patient: 35 mins   Thales Knipple T , MD   [facility] Office  [phone] Pager - Text Page per  Amion as per below:  On-Call/Text Page:      [email or web address]      password [email or web address]  If 7PM-7AM, please contact night-coverage [email or web address] Password [email or web address] 08/26/2014,  6:12 PM   LOS: 12 days

## 2014-08-26 NOTE — Progress Notes (Signed)
CRITICAL VALUE ALERT  Critical value received: Hgb 6.9  Date of notification:  08/26/14  Time of notification:  0643  Critical value read back:yes  Nurse who received alert:  Sherryl Manges  MD notified (1st page):  Fredirick Maudlin, NP  Time of first page:  [phone]  MD notified (2nd page):  Time of second page:  Responding MD:  Fredirick Maudlin, NP  Time MD responded:  [phone]  Orders received from NP. Will continue to monitor pt closely.

## 2014-08-26 NOTE — Progress Notes (Signed)
Occupational Therapy Treatment Patient Details Name: John [name] MRN: [id number] DOB: [birth year] Today's Date: 08/26/2014    History of present illness 59 y.o. male with a history of alcoholism, cirrhosis, coagulopathy, and polysubstance abuse who was discharged from the Hospitalist service on 07/28/2014 having undergone treatment for new onset ascites via paracentesis with removal of 2 L of fluid. He returned to the ED 4/17 with complaints of hematemesis over 3 days as well as bright red blood per rectum   OT comments  Pt with hgb of 6.9, so limited activity to bed level today as pt is awaiting blood.  Improved level of alertness and ability to perform grooming and self feeding.  Attention span remains poor, requiring multiple verbal cues to remain on task.    Follow Up Recommendations  SNF;Supervision/Assistance - 24 hour    Equipment Recommendations       Recommendations for Other Services      Precautions / Restrictions Precautions Precautions: Fall       Mobility Bed Mobility Overal bed mobility: Needs Assistance;+2 for physical assistance             General bed mobility comments: +2 assist to pull up in bed and position for feeding  Transfers                      Balance                                   ADL Overall ADL's : Needs assistance/impaired Eating/Feeding: Bed level;Moderate assistance   Grooming: Oral care;Wash/dry hands;Wash/dry face;Bed level;Moderate assistance                                 General ADL Comments: Pt much more alert. Able to drink with set up, reaching for desired beverage independently. Required assist to load spoon, but then able to bring to mouth independently. Decreased thoroughness with grooming.      Vision                     Perception     Praxis      Cognition   Behavior During Therapy: Flat affect Overall Cognitive Status: Impaired/Different from baseline Area  of Impairment: Orientation;Attention;Memory;Following commands;Safety/judgement;Problem solving Orientation Level: Disoriented to;Time;Situation Current Attention Level: Focused Memory: Decreased short-term memory  Following Commands: Follows one step commands inconsistently Safety/Judgement: Decreased awareness of safety;Decreased awareness of deficits   Problem Solving: Slow processing;Decreased initiation;Difficulty sequencing;Requires verbal cues;Requires tactile cues      Extremity/Trunk Assessment               Exercises     Shoulder Instructions       General Comments      Pertinent Vitals/ Pain       Pain Assessment: Faces Faces Pain Scale: Hurts little more Pain Location: hips Pain Descriptors / Indicators: Grimacing Pain Intervention(s): Monitored during session  Home Living                                          Prior Functioning/Environment              Frequency Min 2X/week     Progress Toward Goals  OT Goals(current goals can now be  found in the care plan section)  Progress towards OT goals: Progressing toward goals  Acute Rehab OT Goals Patient Stated Goal: did not state Time For Goal Achievement: 09/07/14 Potential to Achieve Goals: [place] Discharge plan remains appropriate    Co-evaluation                 End of Session     Activity Tolerance Patient tolerated treatment well   Patient Left in bed;with call bell/phone within reach   Nurse Communication          Time: 1115-5208 OT Time Calculation (min): 36 min  Charges: OT General Charges $OT Visit: 1 Procedure OT Treatments $Self Care/Home Management : 23-37 mins  Malka So 08/26/2014, 10:27 AM  [phone]

## 2014-08-26 NOTE — Progress Notes (Signed)
Speech Language Pathology Treatment: Dysphagia  Patient Details Name: John [name] MRN: [id number] DOB: [birth year] Today's Date: 08/26/2014 Time: 4585-9292 SLP Time Calculation (min) (ACUTE ONLY): 12 min  Assessment / Plan / Recommendation Clinical Impression  Pt more alert this session, but still required max tactile and verbal cues for labial closure on straw/cup, often biting them due to confusion. There is increased concern for aspiration given wet vocal quality, congested respiration, delayed swallow and delayed cough. Recommend pt stay NPO until objective swallow study (FEES due to large body habitus) completed to evaluate pts swallow function. Discussed with RN.    HPI HPI: 59 y.o. BM PMHx Alcoholism, Alcoholic Liver Cirrhosis, coagulopathy, Polysubstance Abuse, accelerated hypertension, thrombocytopenia, who was recently discharged from the hospitalist service on 07/28/2014 at which time he was treated for new onset ascites undergoing paracentesis with removal of 2 L of ascitic fluid.  Admitted 4/17 with UGI bleed; acute blood loss anemia/gastric verices bleeding; renal failure; metabolic encephalopathy.    Pertinent Vitals Pain Assessment: Faces Faces Pain Scale: Hurts little more Pain Descriptors / Indicators: Grimacing Pain Intervention(s): Repositioned  SLP Plan  Other (Comment) (FEES)    Recommendations Diet recommendations: NPO (except necessary meds in puree or thickened liquids) Medication Administration: Via alternative means (if necessary can give meds in puree or thickened liquids)              Oral Care Recommendations: Oral care BID Follow up Recommendations: Skilled Nursing facility Plan: Other (Comment) (FEES)    GO    John Baltimore, MA CCC-SLP [phone]  [name] 08/26/2014, 2:42 PM

## 2014-08-27 LAB — TYPE AND SCREEN
ABO/RH(D): O POS
Antibody Screen: NEGATIVE
UNIT DIVISION: 0

## 2014-08-27 LAB — CBC WITH DIFFERENTIAL/PLATELET
BASOS PCT: 0 % (ref 0–1)
Basophils Absolute: 0 10*3/uL (ref 0.0–0.1)
EOS ABS: 0 10*3/uL (ref 0.0–0.7)
EOS PCT: 0 % (ref 0–5)
HCT: 23.2 % — ABNORMAL LOW (ref 39.0–52.0)
Hemoglobin: 7.9 g/dL — ABNORMAL LOW (ref 13.0–17.0)
Lymphocytes Relative: 12 % (ref 12–46)
Lymphs Abs: 1.7 10*3/uL (ref 0.7–4.0)
MCH: 28.7 pg (ref 26.0–34.0)
MCHC: 34.1 g/dL (ref 30.0–36.0)
MCV: 84.4 fL (ref 78.0–100.0)
Monocytes Absolute: 2.1 10*3/uL — ABNORMAL HIGH (ref 0.1–1.0)
Monocytes Relative: 15 % — ABNORMAL HIGH (ref 3–12)
NEUTROS ABS: 10 10*3/uL — AB (ref 1.7–7.7)
Neutrophils Relative %: 73 % (ref 43–77)
Platelets: 136 10*3/uL — ABNORMAL LOW (ref 150–400)
RBC: 2.75 MIL/uL — ABNORMAL LOW (ref 4.22–5.81)
RDW: 19.7 % — ABNORMAL HIGH (ref 11.5–15.5)
WBC: 13.8 10*3/uL — ABNORMAL HIGH (ref 4.0–10.5)

## 2014-08-27 LAB — HEPATIC FUNCTION PANEL
ALK PHOS: 72 U/L (ref 39–117)
ALT: 55 U/L — AB (ref 0–53)
AST: 165 U/L — AB (ref 0–37)
Albumin: 1.6 g/dL — ABNORMAL LOW (ref 3.5–5.2)
BILIRUBIN DIRECT: 3.8 mg/dL — AB (ref 0.0–0.5)
BILIRUBIN INDIRECT: 3 mg/dL — AB (ref 0.3–0.9)
TOTAL PROTEIN: 7.4 g/dL (ref 6.0–8.3)
Total Bilirubin: 6.8 mg/dL — ABNORMAL HIGH (ref 0.3–1.2)

## 2014-08-27 LAB — CULTURE, BLOOD (ROUTINE X 2)
CULTURE: NO GROWTH
Culture: NO GROWTH

## 2014-08-27 LAB — BASIC METABOLIC PANEL
Anion gap: 9 (ref 5–15)
BUN: 57 mg/dL — AB (ref 6–23)
CHLORIDE: 105 mmol/L (ref 96–112)
CO2: 20 mmol/L (ref 19–32)
CREATININE: 3.12 mg/dL — AB (ref 0.50–1.35)
Calcium: 7.5 mg/dL — ABNORMAL LOW (ref 8.4–10.5)
GFR calc non Af Amer: 21 mL/min — ABNORMAL LOW (ref >90)
GFR, EST AFRICAN AMERICAN: 24 mL/min — AB (ref >90)
Glucose, Bld: 100 mg/dL — ABNORMAL HIGH (ref 70–99)
Potassium: 4.3 mmol/L (ref 3.5–5.1)
Sodium: 134 mmol/L — ABNORMAL LOW (ref 135–145)

## 2014-08-27 LAB — AMMONIA: Ammonia: 60 umol/L — ABNORMAL HIGH (ref 11–32)

## 2014-08-27 MED ORDER — LACTULOSE ENEMA
300.0000 mL | Freq: Three times a day (TID) | ORAL | Status: DC
  Filled 2014-08-27 (×10): qty 300

## 2014-08-27 MED ORDER — LACTULOSE 10 GM/15ML PO SOLN
30.0000 g | Freq: Three times a day (TID) | ORAL | Status: DC
  Administered 2014-08-27 – 2014-09-02 (×19): 30 g via ORAL
  Filled 2014-08-27 (×25): qty 45

## 2014-08-27 MED ORDER — RIFAXIMIN 200 MG PO TABS
200.0000 mg | ORAL_TABLET | Freq: Three times a day (TID) | ORAL | Status: DC
  Administered 2014-08-27 – 2014-08-29 (×6): 200 mg via ORAL
  Filled 2014-08-27 (×8): qty 1

## 2014-08-27 NOTE — Progress Notes (Signed)
[place] TEAM 1 - Stepdown/ICU TEAM Progress Note  Julez Huseby NWG:[id number] DOB: [birth year] DOA: 08/14/2014 PCP: No PCP Per Patient  Admit HPI / Brief Narrative: 59 y.o. male with a history of alcoholism, cirrhosis, coagulopathy, and polysubstance abuse who was discharged from the Hospitalist service on 07/28/2014 having undergone treatment for new onset ascites via paracentesis with removal of 2 L of fluid. He returned to the ED 4/17 with complaints of hematemesis over 3 days as well as bright red blood per rectum. He had several episodes of bloody/maroon liquid stools in the ED. He had a hemoglobin of 5.4 and platelet count of 98,000. INR 1.61 with PT of 19.3. Blood pressure was 125/74 with a pulse of 80, satting 100% on 2 L. Patient complained of abdominal pain located in the epigastric region. He takes Aleve as well as aspirin on a daily basis, and reported he had only had one beer since his last hospitalization.   HPI/Subjective: The patient is slightly more alert today.  He cannot provide me with a detailed history but does report to me that he feels a little bit better.  He denies focal pain.  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 Noted via EGD - now s/p balloon-occluded transvenous obliteration per IR - is hemodynamically stable at present   Acute blood loss anemia now status post 5 units of packed red blood cells - transfuse as needed to keep Hgb 7.0 or > - follow trend  Alcoholic and HCV cirrhosis with ascites Previously counseled patient as to the absolute need to discontinue all forms of alcohol immediately  and completely and as to the likelihood of his early death should he choose to continue to drink - to f/u w/ GI after d/c   Thrombocytopenia  -Most likely secondary to cirrhosis - avoid all heparin products, NSAIDs - Platelet count continues to improve  Acute on chronic renal failure (baseline creatinine 1.49 on 3/16)  -Creatinine continues to fluctuate but seems to have stalled around 3.0 - follow  FUO -Patient has had no further fevers since empiric treatment initiated for SBP - blood cultures and urinalysis unrevealing as is paracentesis fluid culture - chest x-ray without acute findings   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  Code Status: FULL Family Communication: no family present at time of exam Disposition Plan: Stepdown unit  Consultants: GI [facility] IR   Procedures: 4/17 - EGD - Large gastric cardia varices with superficial ulcer 4/18 - BRTO of the gastric varices with STS sclerotherapy and coils 4/19 - Successful fluoroscopic guided removal of occlusion balloon and vascular sheath following BRTO procedure  4/23 US guided paracentesis - 2L removed   Antibiotics: Cipro 4/17 > 4/21 + 4/25 >  DVT prophylaxis: SCDs  Objective: Blood pressure 125/80, pulse 81, temperature 97.7 F (36.5 C), temperature source Oral, resp. rate 26, height 5\' 10"  (1.778 m), weight 118.8 kg (261 lb 14.5 oz), SpO2 100 %.  Intake/Output Summary (Last 24 hours) at 08/27/14 1704 Last data filed at 08/27/14 1658  Gross per 24 hour  Intake   1850 ml  Output    600 ml  Net   1250 ml   Exam: General: lethargic - no acute distress Lungs: Clear to auscultation bilaterally without wheezes or crackles  Cardiovascular: Regular rate and rhythm without murmur gallop or  rub Abdomen: Nontender throughout all 4 quadrants, protuberent with ascites, soft, bowel sounds positive, no rebound, obvious ascites Extremities: No significant cyanosis, or clubbing; 1+ edema bilateral lower extremities  Data Reviewed: Basic Metabolic Panel:  Recent Labs Lab 08/22/14 0546 08/23/14 0617 08/25/14 0538 08/26/14 0500 08/27/14 0440  NA 140 138 134* 135 134*  K 3.7 3.8 4.4 4.0 4.3  CL 108 106 103 105 105  CO2 24 23 22 22 20   GLUCOSE 117* 100* 86 98 100*  BUN 32* 37* 53* 57* 57*  CREATININE 2.21* 2.33* 3.23* 2.99* 3.12*  CALCIUM 7.4* 7.6* 7.4* 7.2* 7.5*    Liver Function Tests:  Recent Labs Lab 08/21/14 0548 08/22/14 0546 08/23/14 0617 08/25/14 0538 08/27/14 0440  AST 114* 133* 160* 167* 165*  ALT 42 45 52 52 55*  ALKPHOS 55 47 57 55 72  BILITOT 8.7* 9.0* 8.5* 6.8* 6.8*  PROT 5.6* 6.3 6.8 6.7 7.4  ALBUMIN 1.6* 1.7* 1.6* 1.5* 1.6*   Coags:  Recent Labs Lab 08/23/14 0617 08/26/14 0500  INR 1.35 1.54*   CBC:  Recent Labs Lab 08/22/14 0546 08/23/14 0617 08/24/14 0511 08/24/14 1830 08/26/14 0500 08/27/14 0440  WBC 10.3 13.7* 13.6* 11.9* 12.5* 13.8*  NEUTROABS 7.8* 10.4* 10.1*  --  8.8* 10.0*  HGB 8.1* 8.0* 7.4* 7.1* 6.9* 7.9*  HCT 23.9* 23.2* 21.4* 20.9* 19.7* 23.2*  MCV 85.1 83.8 84.9 83.9 85.7 84.4  PLT 86* 105* 111* 112* 115* 136*     Recent Results (from the past 240 hour(s))  Culture, blood (routine x 2)     Status: None   Collection Time: 08/19/14  1:23 PM  Result Value Ref Range Status   Specimen Description BLOOD LEFT HAND  Final   Special Requests   Final    BOTTLES DRAWN AEROBIC AND ANAEROBIC 10CC BLUE 5CC RED   Culture   Final    NO GROWTH 5 DAYS Performed at [facility]    Report Status 08/25/2014 FINAL  Final  Culture, blood (routine x 2)     Status: None   Collection Time: 08/19/14  1:30 PM  Result Value Ref Range Status   Specimen Description BLOOD LEFT HAND  Final   Special Requests   Final    BOTTLES  DRAWN AEROBIC AND ANAEROBIC 10CC BLUE 5CC RED   Culture   Final    NO GROWTH 5 DAYS Performed at [facility]    Report Status 08/26/2014 FINAL  Final  Culture, Urine     Status: None   Collection Time: 08/19/14  8:58 PM  Result Value Ref Range Status   Specimen Description URINE, CATHETERIZED  Final   Special Requests NONE  Final   Colony Count NO GROWTH Performed at [facility]   Final   Culture NO GROWTH Performed at [facility]   Final   Report Status 08/21/2014 FINAL  Final  Anaerobic culture     Status: None   Collection Time: 08/20/14 10:41 AM  Result Value Ref Range Status   Specimen Description PERITONEAL CAVITY  Final   Special Requests NONE  Final   Gram Stain   Final    NO WBC SEEN NO SQUAMOUS EPITHELIAL CELLS SEEN  NO ORGANISMS SEEN Performed at [facility]    Culture   Final    NO ANAEROBES ISOLATED Performed at [facility]    Report Status 08/25/2014 FINAL  Final  Body fluid culture     Status: None   Collection Time: 08/20/14 10:41 AM  Result Value Ref Range Status   Specimen Description PERITONEAL CAVITY  Final   Special Requests NONE  Final   Gram Stain   Final    NO WBC SEEN NO ORGANISMS SEEN Performed at [facility]    Culture   Final    NO GROWTH 3 DAYS Performed at [facility]    Report Status 08/24/2014 FINAL  Final  Culture, blood (routine x 2)     Status: None   Collection Time: 08/21/14 11:39 AM  Result Value Ref Range Status   Specimen Description BLOOD LEFT ANTECUBITAL  Final   Special Requests BOTTLES DRAWN AEROBIC ONLY  6CC  Final   Culture   Final    NO GROWTH 5 DAYS Performed at [facility]    Report Status 08/27/2014 FINAL  Final  Culture, blood (routine x 2)     Status: None   Collection Time: 08/21/14 11:49 AM  Result Value Ref Range Status   Specimen Description BLOOD LEFT ANTECUBITAL  Final   Special Requests BOTTLES DRAWN AEROBIC ONLY  2CC  Final    Culture   Final    NO GROWTH 5 DAYS Note: Culture results may be compromised due to an inadequate volume of blood received in culture bottles. Performed at [facility]    Report Status 08/27/2014 FINAL  Final  Culture, Urine     Status: None   Collection Time: 08/21/14  2:04 PM  Result Value Ref Range Status   Specimen Description URINE, RANDOM  Final   Special Requests NONE  Final   Colony Count NO GROWTH Performed at [facility]   Final   Culture NO GROWTH Performed at [facility]   Final   Report Status 08/22/2014 FINAL  Final     Studies:   Recent x-ray studies have been reviewed in detail by the Attending Physician  Scheduled Meds:  Scheduled Meds: . antiseptic oral rinse  7 mL Mouth Rinse BID  . ciprofloxacin  400 mg Intravenous Q24H  . folic acid  1 mg Intravenous Daily  . lactulose  300 mL Rectal BID  . thiamine IV  100 mg Intravenous Daily    Time spent on care of this patient: 35 mins   Lamond Glantz T , MD   [facility] Office  [phone] Pager - Text Page per Shea Evans as per below:  On-Call/Text Page:      [email or web address]      password [email or web address]  If 7PM-7AM, please contact night-coverage [email or web address] Password [email or web address] 08/27/2014, 5:04 PM   LOS: 13 days

## 2014-08-27 NOTE — Procedures (Signed)
Objective Swallowing Evaluation:  FEES  Patient Details  Name: John [name] MRN: [id number] Date of Birth: 05/05/1955  Today's Date: 08/27/2014 Time: SLP Start Time (ACUTE ONLY): 1028-SLP Stop Time (ACUTE ONLY): 1050 SLP Time Calculation (min) (ACUTE ONLY): 22 min  Past Medical History:  Past Medical History  Diagnosis Date  . Hypertension   . Chronic back pain   . Stroke 1998  . Polysubstance abuse   . Optic neuropathy, left   . CHF (congestive heart failure)   . Ascites   . ETOH abuse    Past Surgical History:  Past Surgical History  Procedure Laterality Date  . None    . Esophagogastroduodenoscopy N/A 08/14/2014    Procedure: ESOPHAGOGASTRODUODENOSCOPY (EGD);  Surgeon: Ladene Artist, MD;  Location: [facility];  Service: Endoscopy;  Laterality: N/A;  . Radiology with anesthesia N/A 08/15/2014    Procedure: RADIOLOGY WITH ANESTHESIA;  Surgeon: Greggory Keen, MD;  Location: [facility];  Service: Radiology;  Laterality: N/A;   HPI:  Other Pertinent Information: 59 y.o. BM PMHx Alcoholism, Alcoholic Liver Cirrhosis, coagulopathy, Polysubstance Abuse, accelerated hypertension, thrombocytopenia, who was recently discharged from the hospitalist service on 07/28/2014 at which time he was treated for new onset ascites undergoing paracentesis with removal of 2 L of ascitic fluid.  Admitted 4/17 with UGI bleed; acute blood loss anemia/gastric verices bleeding; renal failure; metabolic encephalopathy.   No Data Recorded  Assessment / Plan / Recommendation CHL IP CLINICAL IMPRESSIONS 08/27/2014  Therapy Diagnosis Mild oral phase dysphagia;Mild pharyngeal phase dysphagia  Clinical Impression Pt upright in bed during FEES examination. Endoscope passed through R nare with dry diffuse secretions and erythema noted in pharynx that cleared as PO trials progressed. Pt presents with mild oral pharyngeal dysphagia. Swallow initiation with thin and nectar consistencies delayed to the level of the  pyriform sinus. However patient evidenced good airway protection throughout study with no penetration or aspiration with all consistencies trialed. Oral holding seen with solid regular POs, that resolved with additonal bite of puree and verbal cueing from clinician. Recommend dysphagia 2 diet with thin liquids; meds crushed with puree. Full supervision at meals secondary to patients fluctuating mentation. Continued swallow intervention indicated for trials of upgraded Pos.         CHL IP TREATMENT RECOMMENDATION 08/27/2014  Treatment Recommendations Therapy as outlined in treatment plan below     CHL IP DIET RECOMMENDATION 08/27/2014  SLP Diet Recommendations Dysphagia 2 (Fine chop);Thin  Liquid Administration via (None)  Medication Administration Crushed with puree  Compensations Slow rate;Small sips/bites;Check for pocketing  Postural Changes and/or Swallow Maneuvers (None)     CHL IP OTHER RECOMMENDATIONS 08/27/2014  Recommended Consults (None)  Oral Care Recommendations Oral care BID  Other Recommendations (None)     CHL IP FOLLOW UP RECOMMENDATIONS 08/26/2014  Follow up Recommendations Skilled Nursing facility     [facility] IP FREQUENCY AND DURATION 08/21/2014  Speech Therapy Frequency (ACUTE ONLY) min 2x/week  Treatment Duration 1 week     Pertinent Vitals/Pain     SLP Swallow Goals No flowsheet data found.  No flowsheet data found.    No flowsheet data found.   CHL IP ORAL PHASE 08/27/2014  Lips (None)  Tongue (None)  Mucous membranes (None)  Nutritional status (None)  Other (None)  Oxygen therapy (None)  Oral Phase Impaired  Oral - Pudding Teaspoon (None)  Oral - Pudding Cup (None)  Oral - Honey Teaspoon (None)  Oral - Honey Cup (None)  Oral - Honey Syringe (None)  Oral - Nectar Teaspoon (None)  Oral - Nectar Cup (None)  Oral - Nectar Straw (None)  Oral - Nectar Syringe (None)  Oral - Ice Chips (None)  Oral - Thin Teaspoon (None)  Oral - Thin Cup (None)  Oral - Thin  Straw (None)  Oral - Thin Syringe (None)  Oral - Puree (None)  Oral - Mechanical Soft (None)  Oral - Regular (None)  Oral - Multi-consistency (None)  Oral - Pill (None)  Oral Phase - Comment (None)      CHL IP PHARYNGEAL PHASE 08/27/2014  Pharyngeal Phase Impaired  Pharyngeal - Pudding Teaspoon (None)  Penetration/Aspiration details (pudding teaspoon) (None)  Pharyngeal - Pudding Cup (None)  Penetration/Aspiration details (pudding cup) (None)  Pharyngeal - Honey Teaspoon (None)  Penetration/Aspiration details (honey teaspoon) (None)  Pharyngeal - Honey Cup (None)  Penetration/Aspiration details (honey cup) (None)  Pharyngeal - Honey Syringe (None)  Penetration/Aspiration details (honey syringe) (None)  Pharyngeal - Nectar Teaspoon (None)  Penetration/Aspiration details (nectar teaspoon) (None)  Pharyngeal - Nectar Cup (None)  Penetration/Aspiration details (nectar cup) (None)  Pharyngeal - Nectar Straw (None)  Penetration/Aspiration details (nectar straw) (None)  Pharyngeal - Nectar Syringe (None)  Penetration/Aspiration details (nectar syringe) (None)  Pharyngeal - Ice Chips (None)  Penetration/Aspiration details (ice chips) (None)  Pharyngeal - Thin Teaspoon (None)  Penetration/Aspiration details (thin teaspoon) (None)  Pharyngeal - Thin Cup (None)  Penetration/Aspiration details (thin cup) (None)  Pharyngeal - Thin Straw (None)  Penetration/Aspiration details (thin straw) (None)  Pharyngeal - Thin Syringe (None)  Penetration/Aspiration details (thin syringe') (None)  Pharyngeal - Puree (None)  Penetration/Aspiration details (puree) (None)  Pharyngeal - Mechanical Soft (None)  Penetration/Aspiration details (mechanical soft) (None)  Pharyngeal - Regular (None)  Penetration/Aspiration details (regular) (None)  Pharyngeal - Multi-consistency (None)  Penetration/Aspiration details (multi-consistency) (None)  Pharyngeal - Pill (None)  Penetration/Aspiration details  (pill) (None)  Pharyngeal Comment (None)     No flowsheet data found.  No flowsheet data found.        Arvil Chaco MA, [facility] Speech Language Pathologist    John [name] 08/27/2014, 1:06 PM

## 2014-08-28 LAB — CBC WITH DIFFERENTIAL/PLATELET
BASOS PCT: 0 % (ref 0–1)
Basophils Absolute: 0 10*3/uL (ref 0.0–0.1)
EOS ABS: 0.1 10*3/uL (ref 0.0–0.7)
Eosinophils Relative: 1 % (ref 0–5)
HEMATOCRIT: 22.2 % — AB (ref 39.0–52.0)
HEMOGLOBIN: 7.6 g/dL — AB (ref 13.0–17.0)
Lymphocytes Relative: 15 % (ref 12–46)
Lymphs Abs: 1.9 10*3/uL (ref 0.7–4.0)
MCH: 29 pg (ref 26.0–34.0)
MCHC: 34.2 g/dL (ref 30.0–36.0)
MCV: 84.7 fL (ref 78.0–100.0)
MONO ABS: 2 10*3/uL — AB (ref 0.1–1.0)
MONOS PCT: 16 % — AB (ref 3–12)
Neutro Abs: 8.5 10*3/uL — ABNORMAL HIGH (ref 1.7–7.7)
Neutrophils Relative %: 68 % (ref 43–77)
Platelets: 126 10*3/uL — ABNORMAL LOW (ref 150–400)
RBC: 2.62 MIL/uL — ABNORMAL LOW (ref 4.22–5.81)
RDW: 20.1 % — ABNORMAL HIGH (ref 11.5–15.5)
WBC: 12.5 10*3/uL — ABNORMAL HIGH (ref 4.0–10.5)

## 2014-08-28 LAB — BASIC METABOLIC PANEL
Anion gap: 10 (ref 5–15)
BUN: 57 mg/dL — ABNORMAL HIGH (ref 6–20)
CHLORIDE: 105 mmol/L (ref 101–111)
CO2: 21 mmol/L — ABNORMAL LOW (ref 22–32)
Calcium: 7.5 mg/dL — ABNORMAL LOW (ref 8.9–10.3)
Creatinine, Ser: 3.07 mg/dL — ABNORMAL HIGH (ref 0.61–1.24)
GFR calc Af Amer: 24 mL/min — ABNORMAL LOW (ref >60)
GFR, EST NON AFRICAN AMERICAN: 21 mL/min — AB (ref >60)
GLUCOSE: 90 mg/dL (ref 70–99)
Potassium: 4.3 mmol/L (ref 3.5–5.1)
Sodium: 136 mmol/L (ref 135–145)

## 2014-08-28 LAB — RETICULOCYTES
RBC.: 2.62 MIL/uL — ABNORMAL LOW (ref 4.22–5.81)
RETIC CT PCT: 4.6 % — AB (ref 0.4–3.1)
Retic Count, Absolute: 120.5 10*3/uL (ref 19.0–186.0)

## 2014-08-28 LAB — AMMONIA: Ammonia: 107 umol/L — ABNORMAL HIGH (ref 9–35)

## 2014-08-28 MED ORDER — FOLIC ACID 1 MG PO TABS
1.0000 mg | ORAL_TABLET | Freq: Every day | ORAL | Status: DC
  Administered 2014-08-28 – 2014-09-02 (×6): 1 mg via ORAL
  Filled 2014-08-28 (×6): qty 1

## 2014-08-28 MED ORDER — FUROSEMIDE 20 MG PO TABS
20.0000 mg | ORAL_TABLET | Freq: Every day | ORAL | Status: DC
  Administered 2014-08-28 – 2014-08-29 (×2): 20 mg via ORAL
  Filled 2014-08-28 (×3): qty 1

## 2014-08-28 MED ORDER — SPIRONOLACTONE 50 MG PO TABS
50.0000 mg | ORAL_TABLET | Freq: Every day | ORAL | Status: DC
  Administered 2014-08-28 – 2014-09-02 (×6): 50 mg via ORAL
  Filled 2014-08-28 (×6): qty 1

## 2014-08-28 MED ORDER — VITAMIN B-1 100 MG PO TABS
100.0000 mg | ORAL_TABLET | Freq: Every day | ORAL | Status: DC
  Administered 2014-08-28 – 2014-08-30 (×3): 100 mg via ORAL
  Filled 2014-08-28 (×4): qty 1

## 2014-08-28 NOTE — Progress Notes (Signed)
Administered full dose of lactulose as ordered.

## 2014-08-28 NOTE — Progress Notes (Signed)
[facility] TEAM 1 - Stepdown/ICU TEAM Progress Note  Thomes Burak QQP:[id number] DOB: [birth year] DOA: 08/14/2014 PCP: No PCP Per Patient  Admit HPI / Brief Narrative: 59 y.o. male with a history of alcoholism, cirrhosis, coagulopathy, and polysubstance abuse who was discharged from the Hospitalist service on 07/28/2014 having undergone treatment for new onset ascites via paracentesis with removal of 2 L of fluid. He returned to the ED 4/17 with complaints of hematemesis over 3 days as well as bright red blood per rectum. He had several episodes of bloody/maroon liquid stools in the ED. He had a hemoglobin of 5.4 and platelet count of 98,000. INR 1.61 with PT of 19.3. Blood pressure was 125/74 with a pulse of 80, satting 100% on 2 L. Patient complained of abdominal pain located in the epigastric region. He takes Aleve as well as aspirin on a daily basis, and reported he had only had one beer since his last hospitalization.    HPI/Subjective: The patient is more alert today than yesterday.  He can tell me he is at [facility] and where he lives.  He cannot tell me what year it is.  He denies any specific complaints but frequently simply stops talking during our interview.  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 Noted via EGD - now s/p balloon-occluded transvenous obliteration per IR - is hemodynamically stable at present - hemoglobin relatively stable  Acute blood loss anemia now status post 5 units of packed red blood cells - transfuse as needed to keep Hgb 7.0 or > - follow trend  Alcoholic and HCV  cirrhosis with ascites Previously counseled patient as to the absolute need to discontinue all forms of alcohol immediately and completely and as to the likelihood of his early death should he choose to continue to drink - to f/u w/ GI after d/c - ascites has reaccumulated to a significant extent - will ask for paracentesis tomorrow  Thrombocytopenia  -Most likely secondary to cirrhosis - avoid all heparin products, NSAIDs - Platelet count stable  Acute on chronic renal failure (baseline creatinine 1.49 on 3/16)  -Creatinine continues to fluctuate but seems to have stalled around 3.0 - follow  FUO -Patient has had no further fevers since empiric treatment initiated for SBP - blood cultures and urinalysis unrevealing as is paracentesis fluid culture - chest x-ray without acute findings   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 rate 22, height 5\' 10"  (1.778  m), weight 120 kg (264 lb 8.8 oz), SpO2 100 %.  Intake/Output Summary (Last 24 hours) at 08/28/14 1751 Last data filed at 08/28/14 1745  Gross per 24 hour  Intake   1200 ml  Output    450 ml  Net    750  ml   Exam: General: More alert today - no acute respiratory distress Lungs: Clear to auscultation bilaterally without wheezes or crackles - poor air movement in bases related to protuberant abdomen Cardiovascular: Regular rate and rhythm without murmur gallop or rub Abdomen: Nontender throughout all 4 quadrants, protuberent with ascites, soft, bowel sounds positive, no rebound, obvious ascites Extremities: No significant cyanosis, or clubbing; 2+ edema bilateral lower extremities  Data Reviewed: Basic Metabolic Panel:  Recent Labs Lab 08/23/14 0617 08/25/14 0538 08/26/14 0500 08/27/14 0440 08/28/14 0650  NA 138 134* 135 134* 136  K 3.8 4.4 4.0 4.3 4.3  CL 106 103 105 105 105  CO2 23 22 22 20  21*  GLUCOSE 100* 86 98 100* 90  BUN 37* 53* 57* 57* 57*  CREATININE 2.33* 3.23* 2.99* 3.12* 3.07*  CALCIUM 7.6* 7.4* 7.2* 7.5* 7.5*    Liver Function Tests:  Recent Labs Lab 08/22/14 0546 08/23/14 0617 08/25/14 0538 08/27/14 0440  AST 133* 160* 167* 165*  ALT 45 52 52 55*  ALKPHOS 47 57 55 72  BILITOT 9.0* 8.5* 6.8* 6.8*  PROT 6.3 6.8 6.7 7.4  ALBUMIN 1.7* 1.6* 1.5* 1.6*   Coags:  Recent Labs Lab 08/23/14 0617 08/26/14 0500  INR 1.35 1.54*   CBC:  Recent Labs Lab 08/23/14 0617 08/24/14 0511 08/24/14 1830 08/26/14 0500 08/27/14 0440 08/28/14 0650  WBC 13.7* 13.6* 11.9* 12.5* 13.8* 12.5*  NEUTROABS 10.4* 10.1*  --  8.8* 10.0* 8.5*  HGB 8.0* 7.4* 7.1* 6.9* 7.9* 7.6*  HCT 23.2* 21.4* 20.9* 19.7* 23.2* 22.2*  MCV 83.8 84.9 83.9 85.7 84.4 84.7  PLT 105* 111* 112* 115* 136* 126*     Recent Results (from the past 240 hour(s))  Culture, blood (routine x 2)     Status: None   Collection Time: 08/19/14  1:23 PM  Result Value Ref Range Status   Specimen Description BLOOD LEFT HAND  Final   Special Requests   Final    BOTTLES DRAWN AEROBIC AND ANAEROBIC 10CC BLUE 5CC RED   Culture   Final    NO GROWTH 5 DAYS Performed at [facility]    Report Status  08/25/2014 FINAL  Final  Culture, blood (routine x 2)     Status: None   Collection Time: 08/19/14  1:30 PM  Result Value Ref Range Status   Specimen Description BLOOD LEFT HAND  Final   Special Requests   Final    BOTTLES DRAWN AEROBIC AND ANAEROBIC 10CC BLUE 5CC RED   Culture   Final    NO GROWTH 5 DAYS Performed at [facility]    Report Status 08/26/2014 FINAL  Final  Culture, Urine     Status: None   Collection Time: 08/19/14  8:58 PM  Result Value Ref Range Status   Specimen Description URINE, CATHETERIZED  Final   Special Requests NONE  Final   Colony Count NO GROWTH Performed at [facility]   Final   Culture NO GROWTH Performed at [facility]   Final   Report Status 08/21/2014 FINAL  Final  Anaerobic culture     Status: None   Collection Time: 08/20/14 10:41 AM  Result  Value Ref Range Status   Specimen Description PERITONEAL CAVITY  Final   Special Requests NONE  Final   Gram Stain   Final    NO WBC SEEN NO SQUAMOUS EPITHELIAL CELLS SEEN NO ORGANISMS SEEN Performed at [facility]    Culture   Final    NO ANAEROBES ISOLATED Performed at [facility]    Report Status 08/25/2014 FINAL  Final  Body fluid culture     Status: None   Collection Time: 08/20/14 10:41 AM  Result Value Ref Range Status   Specimen Description PERITONEAL CAVITY  Final   Special Requests NONE  Final   Gram Stain   Final    NO WBC SEEN NO ORGANISMS SEEN Performed at [facility]    Culture   Final    NO GROWTH 3 DAYS Performed at [facility]    Report Status 08/24/2014 FINAL  Final  Culture, blood (routine x 2)     Status: None   Collection Time: 08/21/14 11:39 AM  Result Value Ref Range Status   Specimen Description BLOOD LEFT ANTECUBITAL  Final   Special Requests BOTTLES DRAWN AEROBIC ONLY  6CC  Final   Culture   Final    NO GROWTH 5 DAYS Performed at [facility]    Report Status 08/27/2014 FINAL  Final    Culture, blood (routine x 2)     Status: None   Collection Time: 08/21/14 11:49 AM  Result Value Ref Range Status   Specimen Description BLOOD LEFT ANTECUBITAL  Final   Special Requests BOTTLES DRAWN AEROBIC ONLY  2CC  Final   Culture   Final    NO GROWTH 5 DAYS Note: Culture results may be compromised due to an inadequate volume of blood received in culture bottles. Performed at [facility]    Report Status 08/27/2014 FINAL  Final  Culture, Urine     Status: None   Collection Time: 08/21/14  2:04 PM  Result Value Ref Range Status   Specimen Description URINE, RANDOM  Final   Special Requests NONE  Final   Colony Count NO GROWTH Performed at [facility]   Final   Culture NO GROWTH Performed at [facility]   Final   Report Status 08/22/2014 FINAL  Final     Studies:   Recent x-ray studies have been reviewed in detail by the Attending Physician  Scheduled Meds:  Scheduled Meds: . antiseptic oral rinse  7 mL Mouth Rinse BID  . ciprofloxacin  400 mg Intravenous Q24H  . folic acid  1 mg Intravenous Daily  . lactulose  30 g Oral TID  . lactulose  300 mL Rectal TID  . rifaximin  200 mg Oral TID  . thiamine IV  100 mg Intravenous Daily    Time spent on care of this patient: 35 mins   Jude Naclerio T , MD   [facility] Office  [phone] Pager - Text Page per Shea Evans as per below:  On-Call/Text Page:      [email or web address]      password [email or web address]  If 7PM-7AM, please contact night-coverage [email or web address] Password [email or web address] 08/28/2014, 5:51 PM   LOS: 14 days

## 2014-08-28 NOTE — Progress Notes (Signed)
Assumed care of pt at this time. Day shift nurse finished shift and gave report. Pt is resting with no complaints. Pt denies any needs. Will monitor. Vitals within normal limits.

## 2014-08-29 LAB — CBC WITH DIFFERENTIAL/PLATELET
Basophils Absolute: 0 10*3/uL (ref 0.0–0.1)
Basophils Relative: 0 % (ref 0–1)
EOS ABS: 0.1 10*3/uL (ref 0.0–0.7)
EOS PCT: 1 % (ref 0–5)
HCT: 21.7 % — ABNORMAL LOW (ref 39.0–52.0)
HEMOGLOBIN: 7.3 g/dL — AB (ref 13.0–17.0)
LYMPHS ABS: 2.3 10*3/uL (ref 0.7–4.0)
Lymphocytes Relative: 20 % (ref 12–46)
MCH: 29.4 pg (ref 26.0–34.0)
MCHC: 33.6 g/dL (ref 30.0–36.0)
MCV: 87.5 fL (ref 78.0–100.0)
Monocytes Absolute: 1.6 10*3/uL — ABNORMAL HIGH (ref 0.1–1.0)
Monocytes Relative: 15 % — ABNORMAL HIGH (ref 3–12)
NEUTROS PCT: 64 % (ref 43–77)
Neutro Abs: 7.1 10*3/uL (ref 1.7–7.7)
Platelets: 122 10*3/uL — ABNORMAL LOW (ref 150–400)
RBC: 2.48 MIL/uL — AB (ref 4.22–5.81)
RDW: 20.8 % — ABNORMAL HIGH (ref 11.5–15.5)
WBC: 11.2 10*3/uL — AB (ref 4.0–10.5)

## 2014-08-29 LAB — FERRITIN: Ferritin: 304 ng/mL (ref 24–336)

## 2014-08-29 LAB — IRON AND TIBC
Iron: 32 ug/dL — ABNORMAL LOW (ref 45–182)
SATURATION RATIOS: 14 % — AB (ref 17.9–39.5)
TIBC: 237 ug/dL — ABNORMAL LOW (ref 250–450)
UIBC: 205 ug/dL

## 2014-08-29 LAB — PROTIME-INR
INR: 1.54 — ABNORMAL HIGH (ref 0.00–1.49)
Prothrombin Time: 18.7 seconds — ABNORMAL HIGH (ref 11.6–15.2)

## 2014-08-29 LAB — BASIC METABOLIC PANEL
ANION GAP: 7 (ref 5–15)
BUN: 58 mg/dL — AB (ref 6–20)
CALCIUM: 7.6 mg/dL — AB (ref 8.9–10.3)
CO2: 22 mmol/L (ref 22–32)
Chloride: 108 mmol/L (ref 101–111)
Creatinine, Ser: 2.78 mg/dL — ABNORMAL HIGH (ref 0.61–1.24)
GFR calc Af Amer: 27 mL/min — ABNORMAL LOW (ref >60)
GFR calc non Af Amer: 24 mL/min — ABNORMAL LOW (ref >60)
GLUCOSE: 89 mg/dL (ref 70–99)
Potassium: 4.1 mmol/L (ref 3.5–5.1)
Sodium: 137 mmol/L (ref 135–145)

## 2014-08-29 LAB — VITAMIN B12: Vitamin B-12: 842 pg/mL (ref 180–914)

## 2014-08-29 LAB — AMMONIA: Ammonia: 30 umol/L (ref 9–35)

## 2014-08-29 LAB — FOLATE: FOLATE: 22 ng/mL (ref >5.9)

## 2014-08-29 MED ORDER — CIPROFLOXACIN HCL 500 MG PO TABS
500.0000 mg | ORAL_TABLET | Freq: Every day | ORAL | Status: DC
  Administered 2014-08-30 – 2014-09-02 (×4): 500 mg via ORAL
  Filled 2014-08-29 (×5): qty 1

## 2014-08-29 MED ORDER — RIFAXIMIN 200 MG PO TABS
400.0000 mg | ORAL_TABLET | Freq: Three times a day (TID) | ORAL | Status: DC
  Administered 2014-08-29 – 2014-09-02 (×13): 400 mg via ORAL
  Filled 2014-08-29 (×16): qty 2

## 2014-08-29 NOTE — Progress Notes (Signed)
Physical Therapy Treatment Patient Details Name: John [name] MRN: [id number] DOB: 1955/10/06 Today's Date: 08/29/2014    History of Present Illness 59 y.o. male with a history of alcoholism, cirrhosis, coagulopathy, and polysubstance abuse who was discharged from the Hospitalist service on 07/28/2014 having undergone treatment for new onset ascites via paracentesis with removal of 2 L of fluid. He returned to the ED 4/17 with complaints of hematemesis over 3 days as well as bright red blood per rectum    PT Comments    Pt admitted with above diagnosis. Pt currently with functional limitations due to balance and endurance deficits as well as weakness.  Pt needs continued PT to progress to maximal functional level. Pt will benefit from skilled PT to increase their independence and safety with mobility to allow discharge to the venue listed below.    Follow Up Recommendations  SNF;Supervision/Assistance - 24 hour     Equipment Recommendations  Rolling walker with 5" wheels;3in1 (PT)    Recommendations for Other Services OT consult     Precautions / Restrictions Precautions Precautions: Fall Restrictions Weight Bearing Restrictions: No    Mobility  Bed Mobility Overal bed mobility: Needs Assistance;+2 for physical assistance Bed Mobility: Supine to Sit Rolling: +2 for physical assistance;Max assist Sidelying to sit: Max assist Supine to sit: Max assist;+2 for physical assistance;HOB elevated     General bed mobility comments: +2 assist to pull up in bed and position.  Significant posterior lean with pt needing max assist to sit up with pt not utilizing his UEs as he should.   Transfers Overall transfer level: Needs assistance Equipment used: Ambulation equipment used Clarise Cruz plus used) Transfers: Sit to/from [employer] to Stand: Mod assist;+2 safety/equipment         General transfer comment: Obtained Sara Plus and pt was able to stand once positioned in the equipment.   Once up, PT was getting ready to move the Clarise Cruz plus for pivot transfe and pt began having a bowel movement.  Pt could not control bowels and continued to have stool.  Nursing notified.  Pt stood a total of 3 1/2 min in [place] with no physical asssit just the assist of the machine. Decided it best to have pt lie back down in bed. Assisted pt back to bed and this PT and rehab tech cleaned pt, cleaned floor, changed bed linens and pt gown. Decided it unsafe for pt skin to get him into chair.    Ambulation/Gait                 Stairs            Wheelchair Mobility    Modified Rankin (Stroke Patients Only)       Balance Overall balance assessment: Needs assistance Sitting-balance support: Bilateral upper extremity supported;Feet supported Sitting balance-Leahy Scale: Poor Sitting balance - Comments: Pt with posterior lean and constant assist needed to maintain balance sitting EOB.  total to mod assist for 4 min at EOB.   Postural control: Posterior lean Standing balance support: Bilateral upper extremity supported;During functional activity Standing balance-Leahy Scale: Poor Standing balance comment: Cannot stand without Sara Plus.                      Cognition Arousal/Alertness: Awake/alert Behavior During Therapy: Flat affect Overall Cognitive Status: Impaired/Different from baseline Area of Impairment: Orientation;Attention;Memory;Following commands;Safety/judgement;Problem solving Orientation Level: Disoriented to;Time;Situation Current Attention Level: Focused Memory: Decreased short-term memory Following Commands: Follows one step commands inconsistently  Safety/Judgement: Decreased awareness of safety;Decreased awareness of deficits   Problem Solving: Slow processing;Decreased initiation;Difficulty sequencing;Requires verbal cues;Requires tactile cues      Exercises General Exercises - Lower Extremity Ankle Circles/Pumps: AROM;5 reps;Both;Supine Long  Arc Quad: AROM;Both;5 reps;Seated    General Comments        Pertinent Vitals/Pain Pain Assessment: Faces Faces Pain Scale: Hurts little more Pain Location: hips Pain Descriptors / Indicators: Grimacing;Sore Pain Intervention(s): Limited activity within patient's tolerance;Monitored during session;Repositioned  VSS    Home Living                      Prior Function            PT Goals (current goals can now be found in the care plan section) Progress towards PT goals: Progressing toward goals    Frequency  Min 3X/week    PT Plan Current plan remains appropriate    Co-evaluation             End of Session Equipment Utilized During Treatment: Gait belt;Other (comment) Clarise Cruz plus) Activity Tolerance: Patient limited by fatigue Patient left: with call bell/phone within reach;in bed;with bed alarm set     Time: 4158-3094 PT Time Calculation (min) (ACUTE ONLY): 38 min  Charges:  $Therapeutic Activity: 23-37 mins $Self Care/Home Management: 8-22                    G Codes:      WhiteGodfrey Pick 2014-09-21, 1:53 PM [place] Bo Teicher,PT Acute Rehabilitation [phone] (pager)

## 2014-08-29 NOTE — Progress Notes (Signed)
[facility] TEAM 1 - Stepdown/ICU TEAM Progress Note  John [name] UMP:[id number] DOB: [birth year] DOA: 08/14/2014 PCP: No PCP Per Patient  Admit HPI / Brief Narrative: 59 y.o. male with a history of alcoholism, cirrhosis, coagulopathy, and polysubstance abuse who was discharged from the Hospitalist service on 07/28/2014 having undergone treatment for new onset ascites via paracentesis with removal of 2 L of fluid. He returned to the ED 4/17 with complaints of hematemesis over 3 days as well as bright red blood per rectum. He had several episodes of bloody/maroon liquid stools in the ED. He had a hemoglobin of 5.4 and platelet count of 98,000. INR 1.61 with PT of 19.3. Blood pressure was 125/74 with a pulse of 80, satting 100% on 2 L. Patient complained of abdominal pain located in the epigastric region. He takes Aleve as well as aspirin on a daily basis, and reported he had only had one beer since his last hospitalization.    HPI/Subjective: The patient continues to improve from a mental status standpoint.  He is significantly more alert today.  He does have remained confused and tells me "I'm about to go to her brother's house."  He denies specific complaints but his history is not felt to be reliable at the present time.  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 Noted via EGD - now s/p balloon-occluded transvenous obliteration per IR - is hemodynamically stable at  present - hemoglobin relatively stable  Acute blood loss anemia now status post 5 units of packed red blood cells - transfuse as needed to keep Hgb 7.0 or > - follow trend  Alcoholic and HCV cirrhosis with ascites Previously counseled patient as to the absolute need to discontinue all forms of alcohol immediately and completely and as to the likelihood of his early death should he choose to continue to drink - to f/u w/ GI after d/c - ascites has reaccumulated to a significant extent but with resumption of diuretics his abdomen is noticeably softer today - we will postpone paracentesis for now and follow  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 - avoid all heparin products, NSAIDs - Platelet count remains stable  Acute on chronic renal failure (baseline creatinine 1.49 on 3/16)  -Creatinine continues to fluctuate but seems to have stalled around 3.0   FUO -Patient has had no further fevers since empiric treatment initiated for SBP - blood cultures and urinalysis unrevealing as is paracentesis fluid culture - chest x-ray without acute findings - this issue appears to have resolved for now - transition to oral SBP prophylactic dose Cipro  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  Code Status: FULL Family Communication: no family present at time of exam Disposition Plan: If mental status remains stable I anticipate patient will be ready for discharge to a skilled nursing facility within  the next 48-72 hours  Consultants: GI - [facility]   Procedures: 4/17 - EGD - Large gastric cardia varices with superficial ulcer 4/18 - BRTO of the gastric varices with STS sclerotherapy and  coils 4/19 - Successful fluoroscopic guided removal of occlusion balloon and vascular sheath following BRTO procedure  4/23 US guided paracentesis - 2L removed   Antibiotics: Cipro 4/17 > 4/21 + 4/25 >  DVT prophylaxis: SCDs  Objective: Blood pressure 121/78, pulse 88, temperature 97.6 F (36.4 C), temperature source Oral, resp. rate 24, height 5\' 10"  (1.778 m), weight 120 kg (264 lb 8.8 oz), SpO2 96 %.  Intake/Output Summary (Last 24 hours) at 08/29/14 1439 Last data filed at 08/29/14 1300  Gross per 24 hour  Intake   1160 ml  Output      0 ml  Net   1160 ml   Exam: General: no acute respiratory distress Lungs: Clear to auscultation bilaterally without wheezes or crackles - poor air movement in bases related to protuberant abdomen Cardiovascular: Regular rate and rhythm  Abdomen: Nontender throughout all 4 quadrants, protuberent with ascites, softer today, bowel sounds positive, no rebound, obvious ascites Extremities: No significant cyanosis, or clubbing; 2+ edema bilateral lower extremities  Data Reviewed: Basic Metabolic Panel:  Recent Labs Lab 08/25/14 0538 08/26/14 0500 08/27/14 0440 08/28/14 0650 08/29/14 0635  NA 134* 135 134* 136 137  K 4.4 4.0 4.3 4.3 4.1  CL 103 105 105 105 108  CO2 22 22 20  21* 22  GLUCOSE 86 98 100* 90 89  BUN 53* 57* 57* 57* 58*  CREATININE 3.23* 2.99* 3.12* 3.07* 2.78*  CALCIUM 7.4* 7.2* 7.5* 7.5* 7.6*    Liver Function Tests:  Recent Labs Lab 08/23/14 0617 08/25/14 0538 08/27/14 0440  AST 160* 167* 165*  ALT 52 52 55*  ALKPHOS 57 55 72  BILITOT 8.5* 6.8* 6.8*  PROT 6.8 6.7 7.4  ALBUMIN 1.6* 1.5* 1.6*   Coags:  Recent Labs Lab 08/23/14 0617 08/26/14 0500 08/29/14 0635  INR 1.35 1.54* 1.54*   CBC:  Recent Labs Lab 08/24/14 0511 08/24/14 1830 08/26/14 0500 08/27/14 0440 08/28/14 0650 08/29/14 0635  WBC 13.6* 11.9* 12.5* 13.8* 12.5* 11.2*  NEUTROABS 10.1*  --  8.8* 10.0* 8.5* 7.1  HGB 7.4* 7.1* 6.9* 7.9*  7.6* 7.3*  HCT 21.4* 20.9* 19.7* 23.2* 22.2* 21.7*  MCV 84.9 83.9 85.7 84.4 84.7 87.5  PLT 111* 112* 115* 136* 126* 122*     Recent Results (from the past 240 hour(s))  Culture, Urine     Status: None   Collection Time: 08/19/14  8:58 PM  Result Value Ref Range Status   Specimen Description URINE, CATHETERIZED  Final   Special Requests NONE  Final   Colony Count NO GROWTH Performed at [facility]   Final   Culture NO GROWTH Performed at [facility]   Final   Report Status 08/21/2014 FINAL  Final  Anaerobic culture     Status: None   Collection Time: 08/20/14 10:41 AM  Result Value Ref Range Status   Specimen Description PERITONEAL CAVITY  Final   Special Requests NONE  Final   Gram Stain   Final    NO WBC SEEN NO SQUAMOUS EPITHELIAL CELLS SEEN NO ORGANISMS SEEN Performed at [facility]    Culture   Final    NO ANAEROBES ISOLATED Performed at [facility]    Report Status 08/25/2014 FINAL  Final  Body fluid culture  Status: None   Collection Time: 08/20/14 10:41 AM  Result Value Ref Range Status   Specimen Description PERITONEAL CAVITY  Final   Special Requests NONE  Final   Gram Stain   Final    NO WBC SEEN NO ORGANISMS SEEN Performed at [facility]    Culture   Final    NO GROWTH 3 DAYS Performed at [facility]    Report Status 08/24/2014 FINAL  Final  Culture, blood (routine x 2)     Status: None   Collection Time: 08/21/14 11:39 AM  Result Value Ref Range Status   Specimen Description BLOOD LEFT ANTECUBITAL  Final   Special Requests BOTTLES DRAWN AEROBIC ONLY  6CC  Final   Culture   Final    NO GROWTH 5 DAYS Performed at [facility]    Report Status 08/27/2014 FINAL  Final  Culture, blood (routine x 2)     Status: None   Collection Time: 08/21/14 11:49 AM  Result Value Ref Range Status   Specimen Description BLOOD LEFT ANTECUBITAL  Final   Special Requests BOTTLES DRAWN AEROBIC ONLY   2CC  Final   Culture   Final    NO GROWTH 5 DAYS Note: Culture results may be compromised due to an inadequate volume of blood received in culture bottles. Performed at [facility]    Report Status 08/27/2014 FINAL  Final  Culture, Urine     Status: None   Collection Time: 08/21/14  2:04 PM  Result Value Ref Range Status   Specimen Description URINE, RANDOM  Final   Special Requests NONE  Final   Colony Count NO GROWTH Performed at [facility]   Final   Culture NO GROWTH Performed at [facility]   Final   Report Status 08/22/2014 FINAL  Final     Studies:   Recent x-ray studies have been reviewed in detail by the Attending Physician  Scheduled Meds:  Scheduled Meds: . antiseptic oral rinse  7 mL Mouth Rinse BID  . ciprofloxacin  400 mg Intravenous Q24H  . folic acid  1 mg Oral Daily  . furosemide  20 mg Oral Daily  . lactulose  30 g Oral TID  . lactulose  300 mL Rectal TID  . rifaximin  200 mg Oral TID  . spironolactone  50 mg Oral Daily  . thiamine  100 mg Oral Daily    Time spent on care of this patient: 25 mins   [facility] T , MD   [facility] Office  [phone] Pager - Text Page per Shea Evans as per below:  On-Call/Text Page:      [email or web address]      password [email or web address]  If 7PM-7AM, please contact night-coverage [email or web address] Password [email or web address] 08/29/2014, 2:39 PM   LOS: 15 days

## 2014-08-29 NOTE — Progress Notes (Signed)
Speech Language Pathology Treatment: Dysphagia  Patient Details Name: John [name] MRN: [id number] DOB: 1955-05-21 Today's Date: 08/29/2014 Time: 0962-8366 SLP Time Calculation (min) (ACUTE ONLY): 1428 min  Assessment / Plan / Recommendation Clinical Impression  Pt demonstrates improved arousal, self feeding am meal after set up assist. Mastication still prolonged but no oral residuals after meal complete. Thin liquids tolerated well though there is ongoing wet vocal quality, though suspect this is unrelated to penetration/aspiration given findings of objective test. Pt may continue current diet and precautions, and appears to have met his baseline function. Will sign off, please reconsult with any concerns.    HPI Other Pertinent Information: 59 y.o. BM PMHx Alcoholism, Alcoholic Liver Cirrhosis, coagulopathy, Polysubstance Abuse, accelerated hypertension, thrombocytopenia, who was recently discharged from the hospitalist service on 07/28/2014 at which time he was treated for new onset ascites undergoing paracentesis with removal of 2 L of ascitic fluid.  Admitted 4/17 with UGI bleed; acute blood loss anemia/gastric verices bleeding; renal failure; metabolic encephalopathy.    Pertinent Vitals    SLP Plan  All goals met    Recommendations Diet recommendations: Dysphagia 2 (fine chop);Thin liquid Liquids provided via: Cup;Straw Medication Administration: Crushed with puree Supervision: Intermittent supervision to cue for compensatory strategies;Patient able to self feed Compensations: Slow rate;Small sips/bites;Check for pocketing Postural Changes and/or Swallow Maneuvers: Seated upright 90 degrees              Oral Care Recommendations: Oral care BID Plan: All goals met    GO    John Baltimore, MA CCC-SLP [phone]  [name] 08/29/2014, 12:28 PM

## 2014-08-29 NOTE — Clinical Social Work Note (Signed)
CSW continuing to follow for DC related needs and to provide support.    Liz Beach MSW, [place], [place], [phone]

## 2014-08-30 DIAGNOSIS — B182 Chronic viral hepatitis C: Secondary | ICD-10-CM | POA: Diagnosis present

## 2014-08-30 DIAGNOSIS — I517 Cardiomegaly: Secondary | ICD-10-CM | POA: Diagnosis present

## 2014-08-30 DIAGNOSIS — K7460 Unspecified cirrhosis of liver: Secondary | ICD-10-CM

## 2014-08-30 DIAGNOSIS — K7031 Alcoholic cirrhosis of liver with ascites: Secondary | ICD-10-CM | POA: Diagnosis present

## 2014-08-30 LAB — CBC WITH DIFFERENTIAL/PLATELET
BASOS ABS: 0 10*3/uL (ref 0.0–0.1)
BASOS PCT: 0 % (ref 0–1)
EOS ABS: 0.1 10*3/uL (ref 0.0–0.7)
Eosinophils Relative: 1 % (ref 0–5)
HCT: 22.5 % — ABNORMAL LOW (ref 39.0–52.0)
Hemoglobin: 7.6 g/dL — ABNORMAL LOW (ref 13.0–17.0)
Lymphocytes Relative: 22 % (ref 12–46)
Lymphs Abs: 2.5 10*3/uL (ref 0.7–4.0)
MCH: 29 pg (ref 26.0–34.0)
MCHC: 33.8 g/dL (ref 30.0–36.0)
MCV: 85.9 fL (ref 78.0–100.0)
MONO ABS: 1.6 10*3/uL — AB (ref 0.1–1.0)
Monocytes Relative: 14 % — ABNORMAL HIGH (ref 3–12)
NEUTROS ABS: 6.9 10*3/uL (ref 1.7–7.7)
Neutrophils Relative %: 63 % (ref 43–77)
Platelets: 121 10*3/uL — ABNORMAL LOW (ref 150–400)
RBC: 2.62 MIL/uL — AB (ref 4.22–5.81)
RDW: 21 % — AB (ref 11.5–15.5)
WBC: 11.1 10*3/uL — ABNORMAL HIGH (ref 4.0–10.5)

## 2014-08-30 LAB — AMMONIA: AMMONIA: 34 umol/L (ref 9–35)

## 2014-08-30 LAB — COMPREHENSIVE METABOLIC PANEL
ALBUMIN: 1.5 g/dL — AB (ref 3.5–5.0)
ALK PHOS: 69 U/L (ref 38–126)
ALT: 56 U/L (ref 17–63)
AST: 161 U/L — AB (ref 15–41)
Anion gap: 9 (ref 5–15)
BILIRUBIN TOTAL: 5.9 mg/dL — AB (ref 0.3–1.2)
BUN: 56 mg/dL — AB (ref 6–20)
CHLORIDE: 105 mmol/L (ref 101–111)
CO2: 20 mmol/L — ABNORMAL LOW (ref 22–32)
Calcium: 7.7 mg/dL — ABNORMAL LOW (ref 8.9–10.3)
Creatinine, Ser: 2.72 mg/dL — ABNORMAL HIGH (ref 0.61–1.24)
GFR calc Af Amer: 28 mL/min — ABNORMAL LOW (ref >60)
GFR calc non Af Amer: 24 mL/min — ABNORMAL LOW (ref >60)
Glucose, Bld: 87 mg/dL (ref 70–99)
POTASSIUM: 4 mmol/L (ref 3.5–5.1)
SODIUM: 134 mmol/L — AB (ref 135–145)
TOTAL PROTEIN: 8.1 g/dL (ref 6.5–8.1)

## 2014-08-30 MED ORDER — FUROSEMIDE 10 MG/ML IJ SOLN
60.0000 mg | Freq: Once | INTRAMUSCULAR | Status: AC
Start: 2014-08-30 — End: 2014-08-30
  Administered 2014-08-30: 60 mg via INTRAVENOUS
  Filled 2014-08-30: qty 6

## 2014-08-30 MED ORDER — PANTOPRAZOLE SODIUM 40 MG PO TBEC: 40.0000 mg | DELAYED_RELEASE_TABLET | Freq: Every day | ORAL | Status: DC

## 2014-08-30 MED ORDER — PANTOPRAZOLE SODIUM 40 MG PO PACK
40.0000 mg | PACK | Freq: Every day | ORAL | Status: DC
  Administered 2014-08-30 – 2014-09-02 (×4): 40 mg via ORAL
  Filled 2014-08-30 (×4): qty 20

## 2014-08-30 MED ORDER — FUROSEMIDE 40 MG PO TABS
60.0000 mg | ORAL_TABLET | Freq: Every day | ORAL | Status: DC
  Administered 2014-08-31 – 2014-09-02 (×3): 60 mg via ORAL
  Filled 2014-08-30 (×4): qty 1

## 2014-08-30 NOTE — Progress Notes (Signed)
Occupational Therapy Treatment Patient Details Name: John [name] MRN: [id number] DOB: 1956/01/15 Today's Date: 08/30/2014    History of present illness 59 y.o. male with a history of alcoholism, cirrhosis, coagulopathy, and polysubstance abuse who was discharged from the Hospitalist service on 07/28/2014 having undergone treatment for new onset ascites via paracentesis with removal of 2 L of fluid. He returned to the ED 4/17 with complaints of hematemesis over 3 days as well as bright red blood per rectum   OT comments  Pt initially placed on OT caseload for a trial basis.  Pt has demonstrated improvement in UE strength, bed mobility, attention, ability to follow commands and in self feeding and grooming. Will update goals next visit if progress continues.    Follow Up Recommendations  SNF;Supervision/Assistance - 24 hour    Equipment Recommendations       Recommendations for Other Services      Precautions / Restrictions Precautions Precautions: Fall       Mobility Bed Mobility Overal bed mobility: Needs Assistance Bed Mobility: Rolling Rolling: Mod assist         General bed mobility comments: Pt able to pull himself up in bed using UEs on headboard with gravity assisting.  Transfers                      Balance                                   ADL Overall ADL's : Needs assistance/impaired Eating/Feeding: Set up;Bed level   Grooming: Wash/dry hands;Wash/dry face;Oral care;Minimal assistance;Bed level                       Toileting- Clothing Manipulation and Hygiene: Total assistance;Bed level         General ADL Comments: Improved appetite with pt opening some containers and mixing his food together as he does at home. Some perseveration noted with stirring. Repeatedly pushing food over edge of plate without self correction.  Improved thoroughness of oral care. Pt with loose stools due to lactolose per RN, total assist for  pericare and bedding change.      Vision                     Perception     Praxis      Cognition   Behavior During Therapy: Flat affect Overall Cognitive Status: Impaired/Different from baseline Area of Impairment: Memory;Attention;Safety/judgement;Problem solving Orientation Level: Disoriented to;Time;Situation Current Attention Level: Sustained Memory: Decreased short-term memory  Following Commands: Follows one step commands consistently Safety/Judgement: Decreased awareness of safety;Decreased awareness of deficits   Problem Solving: Slow processing General Comments: Pt stating "something is not right"    Extremity/Trunk Assessment               Exercises     Shoulder Instructions       General Comments      Pertinent Vitals/ Pain       Pain Assessment: No/denies pain  Home Living                                          Prior Functioning/Environment              Frequency Min 2X/week     Progress Toward  Goals  OT Goals(current goals can now be found in the care plan section)  Progress towards OT goals: Progressing toward goals  Acute Rehab OT Goals Time For Goal Achievement: 09/07/14 Potential to Achieve Goals: [place] Discharge plan remains appropriate    Co-evaluation                 End of Session     Activity Tolerance Patient tolerated treatment well   Patient Left in bed;with call bell/phone within reach;with bed alarm set   Nurse Communication Other (comment) (Per RN, pt trying to get up by himself last night)        Time: 1224-8250 OT Time Calculation (min): 29 min  Charges: OT General Charges $OT Visit: 1 Procedure OT Treatments $Self Care/Home Management : 23-37 mins  Malka So 08/30/2014, 9:08 AM  [phone]

## 2014-08-30 NOTE — Progress Notes (Signed)
[place] TEAM 1 - Stepdown/ICU TEAM Progress Note  John [name] JAS:[id number] DOB: [birth year] DOA: 08/14/2014 PCP: No PCP Per Patient  Admit HPI / Brief Narrative: John [name] is a 59 y.o. BM PMHx Alcoholism, Alcoholic Liver Cirrhosis, coagulopathy, Polysubstance Abuse, accelerated hypertension, thrombocytopenia, who was recently discharged from the hospitalist service on 07/28/2014 at which time he was treated for new onset ascites undergoing paracentesis with removal of 2 L of ascitic fluid.  Presents to the emergency department with complaints of hematemesis over the past 3 days as well as bright red blood per rectum. He was noted have several episodes of bloody/maroon colored liquid consistency stools in the emergency department. CBC showed a hemoglobin of 5.4 with hematocrit of 16 and platelet count of 98,000. He has an INR 1.61 with PT of 19.3. He was urgently typed and crossed and received his first unit of packed red blood cells while in the emergency department. Last blood pressure reading was 125/74 with a pulse of 80, satting 100% on 2 L up in oxygen via nasal cannula. Patient also complains of abdominal pain located in the epigastric region. He takes Aleve as well as aspirin on a daily basis, and tells me he has only had one beer since his last hospitalization. I spoke with Dr. Fuller Plan of GI from the emergency department.   HPI/Subjective: 5/3  A/O 3 (does not know where). Laying in the bed states his abdomen becoming uncomfortable.   Assessment/Plan: Upper GI bleed.  -4/18 S/P Balloon-occluded Retrograde Transvenous Obliteration (BRTO) by GI with balloon removed today. -Hemoglobin stable - 4/18 transfused 3 units PRBC   -4/26 stable -Continue Protonix 40 mg daily  Acute blood loss anemia/gastric varices bleeding -See upper GI bleed  Alcohol-induced liver cirrhosis with ascites/HCV cirrhosis -Last month paracentesis; 2 L fluid removed -Patient's  abdomen still distended softer post paracentesis. -4/23 US guided paracentesis removed 1.7 L; fluid sent for stain, culture, and cytology -Spironolactone 50 mg daily -Patient will require bedside thoracentesis on Thursday 5/5  Thrombocytopenia  -Most likely secondary to alcohol liver cirrhosis -Will avoid all heparin products, NSAIDs -Stable  Acute on chronic renal failure (baseline 1.49 on 3/16)  -Creatinine continues to fluctuate but seems to have stalled around 3.0 -Patient fluid overloaded, Lasix IV 60 mg 1 , then 60 mg PO daily -Strict in and out; since admission + 8 L  Elevated transaminases. - consistent with alcoholic hepatitis.   Chronic alcoholism. - Continue CIWA protocol (4/17 alcohol negative)  Hypertension.  -Stable, continue monitor  Left ventricle hypertrophy/diastolic dysfunction  FUO -4/28 afebrile.  -Continue Ciprofloxacin for empiric SBP coverage SBP  Metabolic encephalopathy -Continue to hold all sedating medication to include morphine, Zofran, oxycodone -Out of bed to chair q shift -Continue lactulose 30 gm PO TID; will DC lactulose enemas for now, follow ammonia level - avoid placing an NG tube due to his known varices with recent bleeding  -Continue Rifaximin 400 mg TID   Coagulopathy -Due to cirrhosis check INR in a.m.    Code Status: FULL Family Communication: no family present at time of exam Disposition Plan: Per GI; SNF    Consultants: Dr.Malcolm T Fuller Plan (GI) Dr.Jaime Wagner (IR)   Procedure/Significant Events: 3/31 echocardiogram;Left ventricle: moderate concentric hypertrophy.-LVEF= 60% to 65%. -(grade 1 diastolic dysfunction).-  4/18 transfused 3 units PRBC 4/18 S/P Balloon-occluded Retrograde Transvenous Obliteration (BRTO) 4/22 PCXR;Small left pleural effusion. Left perihilar and retrocardiac opacity may reflect atelectasis, pneumonia, or perhaps asymmetric edema 4/23 US guided paracentesis removed 1.7 L  Culture 4/18  MRSA by PCR negative 4/22 blood left hand 2 NGTD 4/22 urine negative 4/23 peritoneal fluid NGTD  4/24 blood left antecubital 2 NGTD 4/24 urine negative   Antibiotics: Ciprofloxacin 4/17>> stopped 4/21 Ciprofloxacin 4/25>>  DVT prophylaxis: SCD   Devices    LINES / TUBES:  Double-lumen PICC 4/19>>    Continuous Infusions:    Objective: VITAL SIGNS: Temp: 98.2 F (36.8 C) (05/03 0820) Temp Source: Oral (05/03 0820) BP: 119/78 mmHg (05/03 0820) Pulse Rate: 86 (05/03 0820) SPO2; FIO2:   Intake/Output Summary (Last 24 hours) at 08/30/14 0915 Last data filed at 08/30/14 0500  Gross per 24 hour  Intake    540 ml  Output    575 ml  Net    -35 ml     Exam: General: A/O 3 (does not know where)sitting in Bed,  follows all commands No acute respiratory distress Lungs:  air movement in all lobes, mild rhonchi ,without wheezes or crackles Cardiovascular: Regular rate and rhythm without murmur gallop or rub normal S1 and S2 Abdomen: Nontender to palpation, becoming increasingly distended, firm, bowel sounds positive, no rebound, positive ascites, no appreciable mass, negative fluid wave Extremities: No significant cyanosis, clubbing. 2-3+edema bilateral lower extremities  Data Reviewed: Basic Metabolic Panel:  Recent Labs Lab 08/26/14 0500 08/27/14 0440 08/28/14 0650 08/29/14 0635 08/30/14 0500  NA 135 134* 136 137 134*  K 4.0 4.3 4.3 4.1 4.0  CL 105 105 105 108 105  CO2 22 20 21* 22 20*  GLUCOSE 98 100* 90 89 87  BUN 57* 57* 57* 58* 56*  CREATININE 2.99* 3.12* 3.07* 2.78* 2.72*  CALCIUM 7.2* 7.5* 7.5* 7.6* 7.7*   Liver Function Tests:  Recent Labs Lab 08/25/14 0538 08/27/14 0440 08/30/14 0500  AST 167* 165* 161*  ALT 52 55* 56  ALKPHOS 55 72 69  BILITOT 6.8* 6.8* 5.9*  PROT 6.7 7.4 8.1  ALBUMIN 1.5* 1.6* 1.5*   No results for input(s): LIPASE, AMYLASE in the last 168 hours.  Recent Labs Lab 08/26/14 0500 08/27/14 0440 08/28/14 0650  08/29/14 0637 08/30/14 0500  AMMONIA 61* 60* 107* 30 34   CBC:  Recent Labs Lab 08/26/14 0500 08/27/14 0440 08/28/14 0650 08/29/14 0635 08/30/14 0500  WBC 12.5* 13.8* 12.5* 11.2* 11.1*  NEUTROABS 8.8* 10.0* 8.5* 7.1 6.9  HGB 6.9* 7.9* 7.6* 7.3* 7.6*  HCT 19.7* 23.2* 22.2* 21.7* 22.5*  MCV 85.7 84.4 84.7 87.5 85.9  PLT 115* 136* 126* 122* 121*   Cardiac Enzymes: No results for input(s): CKTOTAL, CKMB, CKMBINDEX, TROPONINI in the last 168 hours. BNP (last 3 results)  Recent Labs  06/24/14 0802  BNP 1168.5*    ProBNP (last 3 results) No results for input(s): PROBNP in the last 8760 hours.  CBG: No results for input(s): GLUCAP in the last 168 hours.  Recent Results (from the past 240 hour(s))  Anaerobic culture     Status: None   Collection Time: 08/20/14 10:41 AM  Result Value Ref Range Status   Specimen Description PERITONEAL CAVITY  Final   Special Requests NONE  Final   Gram Stain   Final    NO WBC SEEN NO SQUAMOUS EPITHELIAL CELLS SEEN NO ORGANISMS SEEN Performed at [facility]    Culture   Final    NO ANAEROBES ISOLATED Performed at [facility]    Report Status 08/25/2014 FINAL  Final  Body fluid culture     Status: None   Collection Time: 08/20/14 10:41  AM  Result Value Ref Range Status   Specimen Description PERITONEAL CAVITY  Final   Special Requests NONE  Final   Gram Stain   Final    NO WBC SEEN NO ORGANISMS SEEN Performed at [facility]    Culture   Final    NO GROWTH 3 DAYS Performed at [facility]    Report Status 08/24/2014 FINAL  Final  Culture, blood (routine x 2)     Status: None   Collection Time: 08/21/14 11:39 AM  Result Value Ref Range Status   Specimen Description BLOOD LEFT ANTECUBITAL  Final   Special Requests BOTTLES DRAWN AEROBIC ONLY  6CC  Final   Culture   Final    NO GROWTH 5 DAYS Performed at [facility]    Report Status 08/27/2014 FINAL  Final  Culture, blood  (routine x 2)     Status: None   Collection Time: 08/21/14 11:49 AM  Result Value Ref Range Status   Specimen Description BLOOD LEFT ANTECUBITAL  Final   Special Requests BOTTLES DRAWN AEROBIC ONLY  2CC  Final   Culture   Final    NO GROWTH 5 DAYS Note: Culture results may be compromised due to an inadequate volume of blood received in culture bottles. Performed at [facility]    Report Status 08/27/2014 FINAL  Final  Culture, Urine     Status: None   Collection Time: 08/21/14  2:04 PM  Result Value Ref Range Status   Specimen Description URINE, RANDOM  Final   Special Requests NONE  Final   Colony Count NO GROWTH Performed at [facility]   Final   Culture NO GROWTH Performed at [facility]   Final   Report Status 08/22/2014 FINAL  Final     Studies:  Recent x-ray studies have been reviewed in detail by the Attending Physician  Scheduled Meds:  Scheduled Meds: . antiseptic oral rinse  7 mL Mouth Rinse BID  . ciprofloxacin  500 mg Oral Q breakfast  . folic acid  1 mg Oral Daily  . furosemide  60 mg Intravenous Once  . furosemide  60 mg Oral Daily  . lactulose  30 g Oral TID  . pantoprazole sodium  40 mg Oral Daily  . rifaximin  400 mg Oral TID  . spironolactone  50 mg Oral Daily  . thiamine  100 mg Oral Daily    Time spent on care of this patient: 40 mins   Jobeth Pangilinan, Geraldo Docker , MD  [facility] Office  [phone] Pager [phone]  On-Call/Text Page:      [email or web address]      password [email or web address]  If 7PM-7AM, please contact night-coverage [email or web address] Password [email or web address] 08/30/2014, 9:15 AM   LOS: 16 days   Care during the described time interval was provided by me .  I have reviewed this patient's available data, including medical history, events of note, physical examination, radiology studies and test results as part of my evaluation  Dia Crawford, MD [phone] Pager

## 2014-08-30 NOTE — Progress Notes (Signed)
  Pt admitted to the unit. Pt is stable, alert and oriented per baseline. Oriented to room, staff, and call bell. Educated to call for any assistance. Bed in lowest position, call bell within reach- will continue to monitor. 

## 2014-08-30 NOTE — Clinical Social Work Note (Signed)
At this time patient does not have a SNF bed offer in [place] or surrounding counties. CSW will have to speak with family regarding their willingness to seek SNF placement beyond surrounding counties. Report left for covering CSW.  Liz Beach MSW, [place], [place], [phone]

## 2014-08-31 ENCOUNTER — Inpatient Hospital Stay (HOSPITAL_COMMUNITY): Payer: Medicaid Other

## 2014-08-31 LAB — CBC WITH DIFFERENTIAL/PLATELET
BASOS PCT: 0 % (ref 0–1)
Basophils Absolute: 0 10*3/uL (ref 0.0–0.1)
EOS ABS: 0.1 10*3/uL (ref 0.0–0.7)
EOS PCT: 1 % (ref 0–5)
HEMATOCRIT: 22.5 % — AB (ref 39.0–52.0)
HEMOGLOBIN: 7.5 g/dL — AB (ref 13.0–17.0)
Lymphocytes Relative: 24 % (ref 12–46)
Lymphs Abs: 2.5 10*3/uL (ref 0.7–4.0)
MCH: 29.2 pg (ref 26.0–34.0)
MCHC: 33.3 g/dL (ref 30.0–36.0)
MCV: 87.5 fL (ref 78.0–100.0)
MONOS PCT: 13 % — AB (ref 3–12)
Monocytes Absolute: 1.4 10*3/uL — ABNORMAL HIGH (ref 0.1–1.0)
NEUTROS ABS: 6.5 10*3/uL (ref 1.7–7.7)
NEUTROS PCT: 62 % (ref 43–77)
Platelets: 115 10*3/uL — ABNORMAL LOW (ref 150–400)
RBC: 2.57 MIL/uL — ABNORMAL LOW (ref 4.22–5.81)
RDW: 20.5 % — ABNORMAL HIGH (ref 11.5–15.5)
WBC: 10.5 10*3/uL (ref 4.0–10.5)

## 2014-08-31 LAB — PROTIME-INR
INR: 1.56 — AB (ref 0.00–1.49)
PROTHROMBIN TIME: 18.8 s — AB (ref 11.6–15.2)

## 2014-08-31 LAB — COMPREHENSIVE METABOLIC PANEL
ALK PHOS: 71 U/L (ref 38–126)
ALT: 53 U/L (ref 17–63)
AST: 154 U/L — ABNORMAL HIGH (ref 15–41)
Albumin: 1.5 g/dL — ABNORMAL LOW (ref 3.5–5.0)
Anion gap: 8 (ref 5–15)
BILIRUBIN TOTAL: 5.5 mg/dL — AB (ref 0.3–1.2)
BUN: 55 mg/dL — ABNORMAL HIGH (ref 6–20)
CALCIUM: 7.6 mg/dL — AB (ref 8.9–10.3)
CHLORIDE: 105 mmol/L (ref 101–111)
CO2: 20 mmol/L — ABNORMAL LOW (ref 22–32)
Creatinine, Ser: 2.67 mg/dL — ABNORMAL HIGH (ref 0.61–1.24)
GFR, EST AFRICAN AMERICAN: 29 mL/min — AB (ref >60)
GFR, EST NON AFRICAN AMERICAN: 25 mL/min — AB (ref >60)
Glucose, Bld: 79 mg/dL (ref 70–99)
Potassium: 4.1 mmol/L (ref 3.5–5.1)
Sodium: 133 mmol/L — ABNORMAL LOW (ref 135–145)
Total Protein: 7.5 g/dL (ref 6.5–8.1)

## 2014-08-31 LAB — AMMONIA: AMMONIA: 40 umol/L — AB (ref 9–35)

## 2014-08-31 MED ORDER — VITAMIN B-1 100 MG PO TABS
500.0000 mg | ORAL_TABLET | Freq: Every day | ORAL | Status: DC
  Administered 2014-08-31 – 2014-09-02 (×3): 500 mg via ORAL
  Filled 2014-08-31 (×3): qty 5

## 2014-08-31 NOTE — Progress Notes (Signed)
Physical Therapy Treatment Patient Details Name: John [name] MRN: [id number] DOB: [birth year] Today's Date: 08/31/2014    History of Present Illness 59 y.o. male with a history of alcoholism, cirrhosis, coagulopathy, and polysubstance abuse who was discharged from the Hospitalist service on 07/28/2014 having undergone treatment for new onset ascites via paracentesis with removal of 2 L of fluid. He returned to the ED 4/17 with complaints of hematemesis over 3 days as well as bright red blood per rectum    PT Comments    Slow progress with OOB activity limited by ongoing diarrhea; Noted for paracentesis soon  Follow Up Recommendations  SNF;Supervision/Assistance - 24 hour (Does pt need a Letter of Guaranty for PT/OT at [facility])     Equipment Recommendations  Rolling walker with 5" wheels;3in1 (PT)    Recommendations for Other Services OT consult     Precautions / Restrictions Precautions Precautions: Fall    Mobility  Bed Mobility Overal bed mobility: Needs Assistance Bed Mobility: Rolling Rolling: Mod assist Sidelying to sit: Max assist;+2 for physical assistance   Sit to supine: +2 for safety/equipment;Mod assist   General bed mobility comments: Initial roll with heavy mod assist, likely heavier due to just awakening; +2 assist to elevate trunk; +2 for better body mechanics to lay back down, and still mod assist, but lighter assist to help him onto the bedpan; Pt able to pull himself up in bed using UEs on headboard with gravity assisting.  Transfers Overall transfer level: Needs assistance Equipment used: 2 person hand held assist (and support given at gait blet) Transfers: Sit to/from Stand Sit to Stand: Mod assist;+2 safety/equipment         General transfer comment: cues and manual facilitation to anteriorly weight shift to initiate; heavy mod assist to power up; noted good recruitment on quads, without knee buckling; stood approx 1 minute; then noted pt was actively  stooling, so sat back down to bed and assisted him onto the bed pan; RN notified  Ambulation/Gait                 Stairs            Wheelchair Mobility    Modified Rankin (Stroke Patients Only)       Balance Overall balance assessment: Needs assistance Sitting-balance support: Bilateral upper extremity supported Sitting balance-Leahy Scale: Poor Sitting balance - Comments: initial posterior lean requiring support; Cues for optimal positioning, and scoot closer to EOB and he was able to maintain balance for brief periods with close guard assist Postural control: Posterior lean   Standing balance-Leahy Scale: Poor                      Cognition Arousal/Alertness: Awake/alert Behavior During Therapy: WFL for tasks assessed/performed (especially once he woke up more) Overall Cognitive Status: Impaired/Different from baseline Area of Impairment: Memory;Attention;Safety/judgement;Problem solving Orientation Level: Disoriented to;Time;Situation Current Attention Level: Sustained Memory: Decreased short-term memory Following Commands: Follows one step commands consistently Safety/Judgement: Decreased awareness of safety;Decreased awareness of deficits   Problem Solving: Slow processing      Exercises      General Comments        Pertinent Vitals/Pain Pain Assessment: No/denies pain    Home Living                      Prior Function            PT Goals (current goals can now be found in the  care plan section) Acute Rehab PT Goals Patient Stated Goal: did not state PT Goal Formulation: With patient Time For Goal Achievement: 09/06/14 Potential to Achieve Goals: Fair Progress towards PT goals: Progressing toward goals    Frequency  Min 3X/week    PT Plan Current plan remains appropriate    Co-evaluation             End of Session Equipment Utilized During Treatment: Gait belt Activity Tolerance: Patient tolerated treatment  well (limited by active stooling) Patient left: in bed;with call bell/phone within reach;with bed alarm set     Time: 2446-2863 PT Time Calculation (min) (ACUTE ONLY): 16 min  Charges:  $Therapeutic Activity: 8-22 mins                    G Codes:      Quin Hoop 08/31/2014, 10:07 AM  Roney Marion, [place] Pager [phone] Office [phone]

## 2014-08-31 NOTE — Progress Notes (Signed)
[facility] TEAM 1 - Stepdown/ICU TEAM Progress Note  John [name] [id number]:[id number] DOB: 03/31/1956 DOA: 08/14/2014 PCP: No PCP Per Patient  Admit HPI / Brief Narrative: 59 y.o. male with a history of alcoholism, cirrhosis, coagulopathy, and polysubstance abuse who was discharged from the Hospitalist service on 07/28/2014 having undergone treatment for new onset ascites via paracentesis with removal of 2 L of fluid. He returned to the ED 4/17 with complaints of hematemesis over 3 days as well as bright red blood per rectum. He had several episodes of bloody/maroon liquid stools in the ED. He had a hemoglobin of 5.4 and platelet count of 98,000. INR 1.61 with PT of 19.3. Blood pressure was 125/74 with a pulse of 80, satting 100% on 2 L. Patient complained of abdominal pain located in the epigastric region. He takes Aleve as well as aspirin on a daily basis, and reported he had only had one beer since his last hospitalization.    HPI/Subjective: The patient is sleeping soundly when I entered the room but awakens to my voice.  He remains modestly confused but is much more alert and has likely reached his baseline mental status.  He continues to have diarrhea which of course is related to his ongoing lactulose therapy.  His abdomen is perhaps slightly more distended today due to ascites and a paracentesis is planned for tomorrow.  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, and  will not likely improve beyond his current mental state - cont empiric thiamine - given recent other neurologic issues (see outpt Neuro notes - optic neuritis) I will repeat MRI of brain now to assure there are no new findings - given his propensity for drinking moonshine he has been previously tested for arsenic, mercury, and lead  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 Noted via EGD - now s/p balloon-occluded transvenous obliteration per IR - is hemodynamically stable at present - hemoglobin stable  Acute blood loss anemia status post 5 units of packed red blood cells and this hospital admission - transfuse as needed to keep Hgb 7.0 or > - hemoglobin steady presently  Alcoholic and HCV cirrhosis with ascites Previously counseled patient as to the absolute need to discontinue all forms of alcohol immediately and completely and as to the likelihood of his early death should he choose to continue to drink - to f/u w/ [facility] after d/c - ascites has reaccumulated to a significant extent w/ plan for repeat paracentesis tomorrow   Hepatitis C This is a new diagnosis apparently made in February of this year - RNA quantitative was markedly elevated in excess of 2 million - GI has seen the patient during this hospital stay - given his ongoing alcohol abuse I suspect he is not currently a candidate for treatment but he may be in follow-up as he will be going to a SNF at time of discharge  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 - avoid all heparin products, NSAIDs - Platelet count remains stable  Acute on chronic renal failure (baseline creatinine 1.49 on 3/16)  -Creatinine  is now very slowly improving - continue to follow  FUO -Patient has had no further fevers since empiric treatment initiated for SBP - blood cultures and urinalysis unrevealing as was paracentesis fluid culture - chest x-ray without acute findings - this issue appears to have resolved for now - transition  to oral SBP prophylactic dose Cipro  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  Code Status: FULL Family Communication: no family present at time of exam Disposition Plan: For paracentesis 09/01/2014 - follow-up MRI results - should be ready for SNF placement within 48 hours  Consultants: GI - [facility]   Procedures: 4/17 - EGD - Large gastric cardia varices with superficial ulcer 4/18 - BRTO of the gastric varices with STS sclerotherapy and coils 4/19 - Successful fluoroscopic guided removal of occlusion balloon and vascular sheath following BRTO procedure  4/23 US guided paracentesis - 2L removed   Antibiotics: Cipro 4/17 > 4/21 + 4/25 >  DVT prophylaxis: SCDs  Objective: Blood pressure 131/71, pulse 82, temperature 98.1 F (36.7 C), temperature source Oral, resp. rate 20, height 5\' 11"  (1.803 m), weight 95.437 kg (210 lb 6.4 oz), SpO2 99 %.  Intake/Output Summary (Last 24 hours) at 08/31/14 0900 Last data filed at 08/31/14 0555  Gross per 24 hour  Intake    720 ml  Output   1550 ml  Net   -830 ml   Exam: General: no acute respiratory distress - alert but confused Lungs: Clear to auscultation bilaterally without wheezes or crackles - poor air movement in bases related to protuberant abdomen Cardiovascular: Regular rate and rhythm - no rub or murmur Abdomen: Nontender throughout, protuberent with ascites, bowel sounds positive, no rebound, obvious ascites Extremities: No significant cyanosis, or clubbing; 2+ edema bilateral lower extremities  Data Reviewed: Basic Metabolic Panel:  Recent Labs Lab 08/27/14 0440 08/28/14 0650 08/29/14 0635 08/30/14 0500 08/31/14 0505  NA 134*  136 137 134* 133*  K 4.3 4.3 4.1 4.0 4.1  CL 105 105 108 105 105  CO2 20 21* 22 20* 20*  GLUCOSE 100* 90 89 87 79  BUN 57* 57* 58* 56* 55*  CREATININE 3.12* 3.07* 2.78* 2.72* 2.67*  CALCIUM 7.5* 7.5* 7.6* 7.7* 7.6*    Liver Function Tests:  Recent Labs Lab 08/25/14 0538 08/27/14 0440 08/30/14 0500 08/31/14 0505  AST 167* 165* 161* 154*  ALT 52 55* 56 53  ALKPHOS 55 72 69 71  BILITOT 6.8* 6.8* 5.9* 5.5*  PROT 6.7 7.4 8.1 7.5  ALBUMIN 1.5* 1.6* 1.5* 1.5*   Coags:  Recent Labs Lab 08/26/14 0500 08/29/14 0635 08/31/14 0505  INR 1.54* 1.54* 1.56*   CBC:  Recent Labs Lab 08/27/14 0440 08/28/14 0650 08/29/14 0635 08/30/14 0500 08/31/14 0505  WBC 13.8* 12.5* 11.2* 11.1* 10.5  NEUTROABS 10.0* 8.5* 7.1 6.9 6.5  HGB 7.9* 7.6* 7.3* 7.6* 7.5*  HCT 23.2* 22.2* 21.7* 22.5* 22.5*  MCV 84.4 84.7 87.5 85.9 87.5  PLT 136* 126* 122* 121* 115*     Recent Results (from the past 240 hour(s))  Culture, blood (routine x 2)     Status: None   Collection Time: 08/21/14 11:39 AM  Result Value Ref Range Status   Specimen Description BLOOD  LEFT ANTECUBITAL  Final   Special Requests BOTTLES DRAWN AEROBIC ONLY  6CC  Final   Culture   Final    NO GROWTH 5 DAYS Performed at [facility]    Report Status 08/27/2014 FINAL  Final  Culture, blood (routine x 2)     Status: None   Collection Time: 08/21/14 11:49 AM  Result Value Ref Range Status   Specimen Description BLOOD LEFT ANTECUBITAL  Final   Special Requests BOTTLES DRAWN AEROBIC ONLY  2CC  Final   Culture   Final    NO GROWTH 5 DAYS Note: Culture results may be compromised due to an inadequate volume of blood received in culture bottles. Performed at [facility]    Report Status 08/27/2014 FINAL  Final  Culture, Urine     Status: None   Collection Time: 08/21/14  2:04 PM  Result Value Ref Range Status   Specimen Description URINE, RANDOM  Final   Special Requests NONE  Final   Colony Count NO  GROWTH Performed at [facility]   Final   Culture NO GROWTH Performed at [facility]   Final   Report Status 08/22/2014 FINAL  Final     Studies:   Recent x-ray studies have been reviewed in detail by the Attending Physician  Scheduled Meds:  Scheduled Meds: . antiseptic oral rinse  7 mL Mouth Rinse BID  . ciprofloxacin  500 mg Oral Q breakfast  . folic acid  1 mg Oral Daily  . furosemide  60 mg Oral Daily  . lactulose  30 g Oral TID  . pantoprazole sodium  40 mg Oral Daily  . rifaximin  400 mg Oral TID  . spironolactone  50 mg Oral Daily  . thiamine  100 mg Oral Daily    Time spent on care of this patient: 35 mins (more extensive review of old records accomplished today requiring minimum of 20 minutes additional time)   Cherene Altes , MD   [facility] Office  [phone] Pager - Text Page per Shea Evans as per below:  On-Call/Text Page:      [email or web address]      password [email or web address]  If 7PM-7AM, please contact night-coverage [email or web address] Password [email or web address] 08/31/2014, 9:00 AM   LOS: 17 days

## 2014-08-31 NOTE — Clinical Social Work Note (Signed)
CSW notes that patient will likely be ready for DC within next 48 hours. Unfortunately SNFs in this area have refused to offer the patient a bed. CSW has requested assistance of supervisor in finding the patient SNF placement as the patient is proving to be difficult to place. CSW will continue to follow.  Liz Beach MSW, [place], [place], [phone]

## 2014-09-01 DIAGNOSIS — K7210 Chronic hepatic failure without coma: Secondary | ICD-10-CM

## 2014-09-01 DIAGNOSIS — R188 Other ascites: Secondary | ICD-10-CM

## 2014-09-01 DIAGNOSIS — R609 Edema, unspecified: Secondary | ICD-10-CM

## 2014-09-01 LAB — COMPREHENSIVE METABOLIC PANEL
ALBUMIN: 1.5 g/dL — AB (ref 3.5–5.0)
ALT: 50 U/L (ref 17–63)
AST: 163 U/L — AB (ref 15–41)
Alkaline Phosphatase: 67 U/L (ref 38–126)
Anion gap: 6 (ref 5–15)
BUN: 51 mg/dL — ABNORMAL HIGH (ref 6–20)
CALCIUM: 7.5 mg/dL — AB (ref 8.9–10.3)
CO2: 21 mmol/L — AB (ref 22–32)
CREATININE: 2.57 mg/dL — AB (ref 0.61–1.24)
Chloride: 106 mmol/L (ref 101–111)
GFR calc non Af Amer: 26 mL/min — ABNORMAL LOW (ref >60)
GFR, EST AFRICAN AMERICAN: 30 mL/min — AB (ref >60)
GLUCOSE: 78 mg/dL (ref 70–99)
Potassium: 4.2 mmol/L (ref 3.5–5.1)
Sodium: 133 mmol/L — ABNORMAL LOW (ref 135–145)
Total Bilirubin: 4.5 mg/dL — ABNORMAL HIGH (ref 0.3–1.2)
Total Protein: 7.3 g/dL (ref 6.5–8.1)

## 2014-09-01 LAB — CBC WITH DIFFERENTIAL/PLATELET
Basophils Absolute: 0 10*3/uL (ref 0.0–0.1)
Basophils Relative: 0 % (ref 0–1)
EOS PCT: 2 % (ref 0–5)
Eosinophils Absolute: 0.2 10*3/uL (ref 0.0–0.7)
HCT: 21.9 % — ABNORMAL LOW (ref 39.0–52.0)
Hemoglobin: 7.3 g/dL — ABNORMAL LOW (ref 13.0–17.0)
LYMPHS ABS: 2.6 10*3/uL (ref 0.7–4.0)
LYMPHS PCT: 28 % (ref 12–46)
MCH: 29.4 pg (ref 26.0–34.0)
MCHC: 33.3 g/dL (ref 30.0–36.0)
MCV: 88.3 fL (ref 78.0–100.0)
MONO ABS: 1.3 10*3/uL — AB (ref 0.1–1.0)
Monocytes Relative: 14 % — ABNORMAL HIGH (ref 3–12)
Neutro Abs: 5.2 10*3/uL (ref 1.7–7.7)
Neutrophils Relative %: 56 % (ref 43–77)
Platelets: 104 10*3/uL — ABNORMAL LOW (ref 150–400)
RBC: 2.48 MIL/uL — AB (ref 4.22–5.81)
RDW: 20.5 % — ABNORMAL HIGH (ref 11.5–15.5)
WBC: 9.2 10*3/uL (ref 4.0–10.5)

## 2014-09-01 LAB — AMMONIA: AMMONIA: 35 umol/L (ref 9–35)

## 2014-09-01 LAB — PROTIME-INR
INR: 1.56 — AB (ref 0.00–1.49)
Prothrombin Time: 18.8 seconds — ABNORMAL HIGH (ref 11.6–15.2)

## 2014-09-01 MED ORDER — OXYCODONE HCL 5 MG PO TABS
10.0000 mg | ORAL_TABLET | Freq: Once | ORAL | Status: AC
  Administered 2014-09-01: 10 mg via ORAL
  Filled 2014-09-01: qty 2

## 2014-09-01 MED ORDER — ALBUMIN HUMAN 25 % IV SOLN
50.0000 g | Freq: Once | INTRAVENOUS | Status: AC
  Administered 2014-09-01: 50 g via INTRAVENOUS
  Filled 2014-09-01 (×2): qty 200

## 2014-09-01 NOTE — Progress Notes (Signed)
Utilization Review completed. Lewanna Petrak RN BSN CM 

## 2014-09-01 NOTE — Progress Notes (Signed)
Patient reported left leg pain this morning- NP Schorr notified.

## 2014-09-01 NOTE — Progress Notes (Signed)
[place] TEAM 1 - Stepdown/ICU TEAM Progress Note  John [name] OQH:[id number] DOB: 26-Apr-1956 DOA: 08/14/2014 PCP: No PCP Per Patient  Admit HPI / Brief Narrative: John [name] is a 59 y.o. BM PMHx Alcoholism, Alcoholic Liver Cirrhosis, coagulopathy, Polysubstance Abuse, accelerated hypertension, thrombocytopenia, who was recently discharged from the hospitalist service on 07/28/2014 at which time he was treated for new onset ascites undergoing paracentesis with removal of 2 L of ascitic fluid.  Presents to the emergency department with complaints of hematemesis over the past 3 days as well as bright red blood per rectum. He was noted have several episodes of bloody/maroon colored liquid consistency stools in the emergency department. CBC showed a hemoglobin of 5.4 with hematocrit of 16 and platelet count of 98,000. He has an INR 1.61 with PT of 19.3. He was urgently typed and crossed and received his first unit of packed red blood cells while in the emergency department. Last blood pressure reading was 125/74 with a pulse of 80, satting 100% on 2 L up in oxygen via nasal cannula. Patient also complains of abdominal pain located in the epigastric region. He takes Aleve as well as aspirin on a daily basis, and tells me he has only had one beer since his last hospitalization. I spoke with Dr. Fuller Plan of GI from the emergency department.   HPI/Subjective: 5/4  A/O 4 , sitting in chair comfortably. Requested to know when we would attempt the paracentesis.   Assessment/Plan: Upper GI bleed.  -4/18 S/P Balloon-occluded Retrograde Transvenous Obliteration (BRTO) by GI with balloon removed today. -Hemoglobin stable - 4/18 transfused 3 units PRBC   -Continue Protonix 40 mg daily  Acute blood loss anemia/gastric varices bleeding -See upper GI bleed  Alcohol-induced liver cirrhosis with ascites/HCV cirrhosis -Last month paracentesis; 2 L fluid removed -Patient's abdomen  still distended softer post paracentesis. -4/23 US guided paracentesis removed 1.7 L; fluid sent for stain, culture, and cytology -Spironolactone 50 mg daily -Thoracentesis on Thursday 5/5; yielded minimal amount of fluid (59ml); believe most of patient's abdominal swelling is edema. However patient agreed to allow radiology to attempt another paracentesis  Abdominal edema -Appears majority patient's increased girth is secondary to abdominal edema, with ascites as a secondary contributor.  -Counseled patient that if radiology was unable to obtain significant amount of fluid on their paracentesis attempt would need to increase his diuretic.  Thrombocytopenia  -Most likely secondary to alcohol liver cirrhosis -Will avoid all heparin products, NSAIDs -Stable  Acute on chronic renal failure (baseline 1.49 on 3/16)  -Creatinine continues to fluctuate but seems to have stalled around 3.0 -Strict in and out; since admission + 5.9 L -If radiology does not obtain significant amount of fluid via ultrasound-guided paracentesis will need to increase patient's diuretic.  Elevated transaminases. - consistent with alcoholic hepatitis.   Chronic alcoholism. - Continue CIWA protocol (4/17 alcohol negative)  Hypertension.  -Stable, continue monitor  Left ventricle hypertrophy/diastolic dysfunction  FUO -4/28 afebrile.  -Continue Ciprofloxacin for empiric SBP coverage SBP  Metabolic encephalopathy -Continue to hold all sedating medication to include morphine, Zofran, oxycodone -Out of bed to chair q shift -Continue lactulose 30 gm PO TID; follow ammonia level - avoid placing an NG tube due to his known varices with recent bleeding  -Continue Rifaximin 400 mg TID   Coagulopathy -Due to cirrhosis check INR in a.m.    Code Status: FULL Family Communication: no family present at time of exam Disposition Plan: Per GI; SNF    Consultants: Dr.Malcolm T  Fuller Plan (GI) Dr.Jaime Earleen Newport  (IR)   Procedure/Significant Events: 3/31 echocardiogram;Left ventricle: moderate concentric hypertrophy.-LVEF= 60% to 65%. -(grade 1 diastolic dysfunction).-  4/18 transfused 3 units PRBC 4/18 S/P Balloon-occluded Retrograde Transvenous Obliteration (BRTO) 4/22 PCXR;Small left pleural effusion. Left perihilar and retrocardiac opacity may reflect atelectasis, pneumonia, or perhaps asymmetric edema 4/23 US guided paracentesis removed 1.7 L  Culture 4/18 MRSA by PCR negative 4/22 blood left hand 2 NGTD 4/22 urine negative 4/23 peritoneal fluid NGTD  4/24 blood left antecubital 2 NGTD 4/24 urine negative   Antibiotics: Ciprofloxacin 4/17>> stopped 4/21 Ciprofloxacin 4/25>>  DVT prophylaxis: SCD   Devices    LINES / TUBES:  Double-lumen PICC 4/19>>    Continuous Infusions:    Objective: VITAL SIGNS:   SPO2; FIO2:   Intake/Output Summary (Last 24 hours) at 09/01/14 1749 Last data filed at 09/01/14 1748  Gross per 24 hour  Intake    360 ml  Output   1600 ml  Net  -1240 ml     Exam: General: A/O 4, NAD,  follows all commands No acute respiratory distress Lungs:  air movement in all lobes, mild rhonchi ,without wheezes or crackles Cardiovascular: Regular rate and rhythm without murmur gallop or rub normal S1 and S2 Abdomen: Nontender to palpation, increasingly distended, firm, bowel sounds positive, no rebound, positive abdominal edema, positive ascites, no appreciable mass, negative fluid wave Extremities: No significant cyanosis, clubbing. 3+edema bilateral lower extremities  Data Reviewed: Basic Metabolic Panel:  Recent Labs Lab 08/28/14 0650 08/29/14 0635 08/30/14 0500 08/31/14 0505 09/01/14 0536  NA 136 137 134* 133* 133*  K 4.3 4.1 4.0 4.1 4.2  CL 105 108 105 105 106  CO2 21* 22 20* 20* 21*  GLUCOSE 90 89 87 79 78  BUN 57* 58* 56* 55* 51*  CREATININE 3.07* 2.78* 2.72* 2.67* 2.57*  CALCIUM 7.5* 7.6* 7.7* 7.6* 7.5*   Liver Function  Tests:  Recent Labs Lab 08/27/14 0440 08/30/14 0500 08/31/14 0505 09/01/14 0536  AST 165* 161* 154* 163*  ALT 55* 56 53 50  ALKPHOS 72 69 71 67  BILITOT 6.8* 5.9* 5.5* 4.5*  PROT 7.4 8.1 7.5 7.3  ALBUMIN 1.6* 1.5* 1.5* 1.5*   No results for input(s): LIPASE, AMYLASE in the last 168 hours.  Recent Labs Lab 08/28/14 0650 08/29/14 0637 08/30/14 0500 08/31/14 0500 09/01/14 0536  AMMONIA 107* 30 34 40* 35   CBC:  Recent Labs Lab 08/28/14 0650 08/29/14 0635 08/30/14 0500 08/31/14 0505 09/01/14 0536  WBC 12.5* 11.2* 11.1* 10.5 9.2  NEUTROABS 8.5* 7.1 6.9 6.5 5.2  HGB 7.6* 7.3* 7.6* 7.5* 7.3*  HCT 22.2* 21.7* 22.5* 22.5* 21.9*  MCV 84.7 87.5 85.9 87.5 88.3  PLT 126* 122* 121* 115* 104*   Cardiac Enzymes: No results for input(s): CKTOTAL, CKMB, CKMBINDEX, TROPONINI in the last 168 hours. BNP (last 3 results)  Recent Labs  06/24/14 0802  BNP 1168.5*    ProBNP (last 3 results) No results for input(s): PROBNP in the last 8760 hours.  CBG: No results for input(s): GLUCAP in the last 168 hours.  No results found for this or any previous visit (from the past 240 hour(s)).   Studies:  Recent x-ray studies have been reviewed in detail by the Attending Physician  Scheduled Meds:  Scheduled Meds: . antiseptic oral rinse  7 mL Mouth Rinse BID  . ciprofloxacin  500 mg Oral Q breakfast  . folic acid  1 mg Oral Daily  . furosemide  60 mg  Oral Daily  . lactulose  30 g Oral TID  . pantoprazole sodium  40 mg Oral Daily  . rifaximin  400 mg Oral TID  . spironolactone  50 mg Oral Daily  . thiamine  500 mg Oral Daily    Time spent on care of this patient: 40 mins   WOODS, Geraldo Docker , MD  [facility] Office  [phone] Pager [phone]  On-Call/Text Page:      [email or web address]      password [email or web address]  If 7PM-7AM, please contact night-coverage [email or web address] Password [email or web address] 09/01/2014, 5:49 PM   LOS: 18 days   Care during the described time interval was  provided by me .  I have reviewed this patient's available data, including medical history, events of note, physical examination, radiology studies and test results as part of my evaluation  Dia Crawford, MD [phone] Pager

## 2014-09-01 NOTE — Progress Notes (Signed)
Occupational Therapy Treatment Patient Details Name: John [name] MRN: [id number] DOB: [birth year] Today's Date: 09/01/2014    History of present illness 59 y.o. male with a history of alcoholism, cirrhosis, coagulopathy, and polysubstance abuse who was discharged from the Hospitalist service on 07/28/2014 having undergone treatment for new onset ascites via paracentesis with removal of 2 L of fluid. He returned to the ED 4/17 with complaints of hematemesis over 3 days as well as bright red blood per rectum   OT comments  Pt making very slow progress toward goals.  2/4 goals met.  Pt with increased ability to get onto his feet today but had increased difficulty weight shifting to transfer.    Follow Up Recommendations  SNF;Supervision/Assistance - 24 hour    Equipment Recommendations       Recommendations for Other Services      Precautions / Restrictions Precautions Precautions: Fall Restrictions Weight Bearing Restrictions: No       Mobility Bed Mobility Overal bed mobility: Needs Assistance     Sidelying to sit: Max assist       General bed mobility comments: pt unable to assist in moving legs at all. Pt was able to use UEs to assist with bed mobility by reaching for bedrails.  Transfers Overall transfer level: Needs assistance Equipment used: 2 person hand held assist Transfers: Stand Pivot Transfers;Sit to/from Stand Sit to Stand: Mod assist Stand pivot transfers: Max assist;+2 physical assistance       General transfer comment: Feel [facility] or [facility] would be good option to transfer pt.  Today coming sit to stand was not difficult, but weight shifting was nearly impossible.    Balance Overall balance assessment: Needs assistance Sitting-balance support: Bilateral upper extremity supported;Feet supported Sitting balance-Leahy Scale: Poor Sitting balance - Comments: PT sat EOB for appx 8 minutes with min assist to mod assist due to posterior lean.     Standing  balance support: Bilateral upper extremity supported;During functional activity Standing balance-Leahy Scale: Zero Standing balance comment: Pt has to have full outside support to stand.                   ADL Overall ADL's : Needs assistance/impaired Eating/Feeding: Set up;Sitting Eating/Feeding Details (indicate cue type and reason): Pt transferred to chair with +2 assist to eat.  It was only mod A of 1 from high surface to stand but total A x2 to pivot.  Pt may benefit from steady if pt keeps standing with assist of 1.                                 Functional mobility during ADLs: Maximal assistance;+2 for physical assistance General ADL Comments: Pt motitvated to get in chair and willing to try to stand to do so. Standing went well but pivoting was very difficult.  Pt sat on EOB with max A x1 but could not maintain sitting on EOB w/o mod assist due to posterior lean.  Pt's abdomen so distended it is diffucult for pt to get his center of gravity to be able to hold self up.      Vision                     Perception     Praxis      Cognition   Behavior During Therapy: Flat affect Overall Cognitive Status: Impaired/Different from baseline Area of Impairment: Attention;Memory;Safety/judgement;Problem solving;Awareness  Current Attention Level: Sustained Memory: Decreased short-term memory    Safety/Judgement: Decreased awareness of safety;Decreased awareness of deficits Awareness: Intellectual Problem Solving: Slow processing General Comments: Pt said several times that nothing in his "brain" seems right.  He cant figure things out.    Extremity/Trunk Assessment               Exercises     Shoulder Instructions       General Comments      Pertinent Vitals/ Pain       Pain Assessment: Faces Pain Score: 6  Faces Pain Scale: Hurts even more Pain Location: R leg and when touching bottom of R foot.  Moans during most movement. Pain  Descriptors / Indicators: Aching Pain Intervention(s): Limited activity within patient's tolerance;Monitored during session;Repositioned  Home Living                                          Prior Functioning/Environment              Frequency Min 2X/week     Progress Toward Goals  OT Goals(current goals can now be found in the care plan section)  Progress towards OT goals: Progressing toward goals  Acute Rehab OT Goals Patient Stated Goal: did not state OT Goal Formulation: With patient Time For Goal Achievement: 09/07/14 Potential to Achieve Goals: Fair ADL Goals Pt Will Perform Eating: with min assist;sitting;bed level Pt Will Perform Grooming: with min assist;sitting;bed level Additional ADL Goal #1: Pt will follow one step commands with 50 % accuracy. Additional ADL Goal #2: Pt will roll with min assist to assist with linen changes and bathing.  Plan Discharge plan remains appropriate    Co-evaluation                 End of Session     Activity Tolerance Patient tolerated treatment well   Patient Left in chair;with call bell/phone within reach (NA aware that pt not on chair alarm.)   Nurse Communication Mobility status        Time: 5331-7409 OT Time Calculation (min): 26 min  Charges: OT General Charges $OT Visit: 1 Procedure OT Treatments $Self Care/Home Management : 23-37 mins  Glenford Peers 09/01/2014, 2:03 PM  [phone]

## 2014-09-01 NOTE — Procedures (Signed)
After explaining the risk and benefits of a paracentesis patient signed a consent form for bedside paracentesis. A timeout was conducted to verify the site of the procedure, and the area was prepped in a sterile fashion. Local anesthesia was with lidocaine. Site was verified with ultrasound and a spinal needle was inserted to obtain specimen for lab. Approximately 5 mls of straw-colored fluid was obtained and no further fluid was able to be aspirated. Ultrasound again verified that there was some ascites but Secondary to patient's coagulopathy, it was decided to abort the procedure. Patient did agree to allow radiology to attempt to aspirate additional fluid. Patient tolerated procedure well.

## 2014-09-01 NOTE — Clinical Social Work Note (Signed)
Clinical Social Worker continuing to follow patient and family for support and discharge planning needs.  CSW received notification from [facility] that they would offer at bed.  CSW spoke with patient at bedside who is agreeable with discharge plan.  Patient requested CSW to provide his brother with an update - CSW spoke with patient brother over the phone who expressed his frustrations about the distance but agreeable with discharge plan.  CSW left a message with admissions coordinator to confirm patient agreement.  CSW also spoke with supervisor who will provide LOG at discharge, per facility request.  CSW remains available for support and to facilitate patient discharge needs once medically stable.  Barbette Or, [place]

## 2014-09-02 ENCOUNTER — Inpatient Hospital Stay (HOSPITAL_COMMUNITY): Payer: Medicaid Other

## 2014-09-02 DIAGNOSIS — B1920 Unspecified viral hepatitis C without hepatic coma: Secondary | ICD-10-CM

## 2014-09-02 DIAGNOSIS — B199 Unspecified viral hepatitis without hepatic coma: Secondary | ICD-10-CM | POA: Diagnosis present

## 2014-09-02 DIAGNOSIS — R600 Localized edema: Secondary | ICD-10-CM | POA: Diagnosis present

## 2014-09-02 DIAGNOSIS — F1020 Alcohol dependence, uncomplicated: Secondary | ICD-10-CM

## 2014-09-02 LAB — CBC WITH DIFFERENTIAL/PLATELET
BASOS ABS: 0 10*3/uL (ref 0.0–0.1)
Basophils Relative: 1 % (ref 0–1)
Eosinophils Absolute: 0.1 10*3/uL (ref 0.0–0.7)
Eosinophils Relative: 1 % (ref 0–5)
HCT: 21.6 % — ABNORMAL LOW (ref 39.0–52.0)
Hemoglobin: 7.1 g/dL — ABNORMAL LOW (ref 13.0–17.0)
LYMPHS ABS: 1.7 10*3/uL (ref 0.7–4.0)
LYMPHS PCT: 27 % (ref 12–46)
MCH: 28.7 pg (ref 26.0–34.0)
MCHC: 32.9 g/dL (ref 30.0–36.0)
MCV: 87.4 fL (ref 78.0–100.0)
Monocytes Absolute: 1 10*3/uL (ref 0.1–1.0)
Monocytes Relative: 15 % — ABNORMAL HIGH (ref 3–12)
Neutro Abs: 3.5 10*3/uL (ref 1.7–7.7)
Neutrophils Relative %: 56 % (ref 43–77)
Platelets: 90 10*3/uL — ABNORMAL LOW (ref 150–400)
RBC: 2.47 MIL/uL — AB (ref 4.22–5.81)
RDW: 20 % — ABNORMAL HIGH (ref 11.5–15.5)
WBC: 6.3 10*3/uL (ref 4.0–10.5)

## 2014-09-02 LAB — PROTEIN, BODY FLUID: Total protein, fluid: <3 g/dL

## 2014-09-02 LAB — COMPREHENSIVE METABOLIC PANEL
ALBUMIN: 1.9 g/dL — AB (ref 3.5–5.0)
ALT: 42 U/L (ref 17–63)
AST: 129 U/L — AB (ref 15–41)
Alkaline Phosphatase: 54 U/L (ref 38–126)
Anion gap: 8 (ref 5–15)
BUN: 49 mg/dL — ABNORMAL HIGH (ref 6–20)
CALCIUM: 7.9 mg/dL — AB (ref 8.9–10.3)
CO2: 20 mmol/L — ABNORMAL LOW (ref 22–32)
CREATININE: 2.62 mg/dL — AB (ref 0.61–1.24)
Chloride: 108 mmol/L (ref 101–111)
GFR calc Af Amer: 30 mL/min — ABNORMAL LOW (ref >60)
GFR calc non Af Amer: 25 mL/min — ABNORMAL LOW (ref >60)
Glucose, Bld: 78 mg/dL (ref 70–99)
Potassium: 4.1 mmol/L (ref 3.5–5.1)
Sodium: 136 mmol/L (ref 135–145)
TOTAL PROTEIN: 7.3 g/dL (ref 6.5–8.1)
Total Bilirubin: 4.5 mg/dL — ABNORMAL HIGH (ref 0.3–1.2)

## 2014-09-02 LAB — PROTIME-INR
INR: 1.65 — ABNORMAL HIGH (ref 0.00–1.49)
Prothrombin Time: 19.7 seconds — ABNORMAL HIGH (ref 11.6–15.2)

## 2014-09-02 LAB — BODY FLUID CELL COUNT WITH DIFFERENTIAL
Eos, Fluid: NONE SEEN %
LYMPHS FL: 16 %
MONOCYTE-MACROPHAGE-SEROUS FLUID: 79 % (ref 50–90)
Neutrophil Count, Fluid: 5 % (ref 0–25)
WBC FLUID: 228 uL (ref 0–1000)

## 2014-09-02 LAB — GLUCOSE, SEROUS FLUID: GLUCOSE FL: 94 mg/dL

## 2014-09-02 LAB — PH, BODY FLUID: pH, Fluid: 8

## 2014-09-02 LAB — LACTATE DEHYDROGENASE, PLEURAL OR PERITONEAL FLUID: LD, Fluid: 61 U/L — ABNORMAL HIGH (ref 3–23)

## 2014-09-02 LAB — AMMONIA: Ammonia: 24 umol/L (ref 9–35)

## 2014-09-02 LAB — AMYLASE, PERITONEAL FLUID: Amylase, peritoneal fluid: 26 U/L

## 2014-09-02 LAB — ALBUMIN, FLUID (OTHER)

## 2014-09-02 MED ORDER — FUROSEMIDE 20 MG PO TABS: 60.0000 mg | ORAL_TABLET | Freq: Every day | ORAL | Status: DC

## 2014-09-02 MED ORDER — THIAMINE HCL 500 MG PO TABS: 500.0000 mg | ORAL_TABLET | Freq: Every day | ORAL | Status: DC

## 2014-09-02 MED ORDER — LIDOCAINE HCL (PF) 1 % IJ SOLN
INTRAMUSCULAR | Status: AC
  Filled 2014-09-02: qty 10

## 2014-09-02 MED ORDER — CIPROFLOXACIN HCL 500 MG PO TABS
500.0000 mg | ORAL_TABLET | Freq: Every day | ORAL | Status: DC
Start: 2014-09-02 — End: 2014-11-08

## 2014-09-02 MED ORDER — PANTOPRAZOLE SODIUM 40 MG PO TBEC: 40.0000 mg | DELAYED_RELEASE_TABLET | Freq: Every day | ORAL | Status: DC

## 2014-09-02 MED ORDER — FOLIC ACID 1 MG PO TABS: 1.0000 mg | ORAL_TABLET | Freq: Every day | ORAL | Status: DC

## 2014-09-02 MED ORDER — RIFAXIMIN 200 MG PO TABS: 400.0000 mg | ORAL_TABLET | Freq: Three times a day (TID) | ORAL | Status: DC

## 2014-09-02 MED ORDER — LACTULOSE 10 GM/15ML PO SOLN
30.0000 g | Freq: Three times a day (TID) | ORAL | Status: DC
Start: 2014-09-02 — End: 2014-11-08

## 2014-09-02 NOTE — Clinical Social Work Note (Signed)
Patient has a bed at [facility].   Liz Beach MSW, [place], [place], [phone]

## 2014-09-02 NOTE — Discharge Summary (Signed)
Physician Discharge Summary  John [name] WPY:[id number] DOB: [birth year] DOA: 08/14/2014  PCP: No PCP Per Patient  Admit date: 08/14/2014 Discharge date: 09/02/2014  Time spent: 40 minutes  Recommendations for Outpatient Follow-up:  Upper GI bleed.  -4/18 S/P Balloon-occluded Retrograde Transvenous Obliteration (BRTO) by GI  - 4/18 transfused 3 units PRBC  -Continue Protonix 40 mg daily -Follow-up Dr.Malcolm T Fuller Plan (GI), in one month  Acute blood loss anemia/gastric varices bleeding -See upper GI bleed  Alcohol-induced liver cirrhosis with ascites/HCV cirrhosis -Last month paracentesis; 2 L fluid removed -4/23 US guided paracentesis removed 1.7 L; fluid sent for stain, culture, and cytology -Continue Spironolactone 50 mg daily -Discharge on Lasix 60 mg daily -5/5  US guided paracentesis removed 56ml  -5/6 ultrasound-guided paracentesis removed 4L  Abdominal edema/pedal edema -Appears majority patient's increased girth is secondary to abdominal edema, with ascites as a secondary contributor.  -Discharge on Lasix 60 mg daily  Thrombocytopenia  -Most likely secondary to alcohol liver cirrhosis -Avoid all heparin products, NSAIDs  Acute on chronic renal failure (baseline 1.49 on 3/16)  -Creatinine continues to fluctuate but seems to have stalled around 3.0 -Strict in and out; since admission + 1.4 L  Elevated transaminases. -Consistent with alcoholic hepatitis.   Chronic alcoholism. - Counseled on total abstinence   Hypertension.  -Continue Lasix 60 mg daily -Continue spironolactone 50 mg daily  Left ventricle hypertrophy/diastolic dysfunction  Metabolic encephalopathy -Continue to hold all sedating medication to include morphine, Zofran, oxycodone -Continue lactulose 30 gm PO TID  - Continue Rifaximin 400 mg TID   Coagulopathy -PCP to monitor    Discharge Diagnoses:  Principal Problem:   Upper GI bleed Active Problems:   Cirrhosis   Acute blood loss  anemia   Alcohol abuse   Coagulopathy   AKI (acute kidney injury)   GI bleed   GIB (gastrointestinal bleeding)   Bleeding gastric varices   Gastric varices   Varices, gastric   Acute on chronic renal failure   Elevated transaminase level   Essential hypertension   Acute encephalopathy   Ascites due to alcoholic cirrhosis   Hypernatremia   Other specified fever   Pulmonary edema   Mental status change   Metabolic encephalopathy   Diastolic dysfunction   Fever of unknown origin   Alcoholic cirrhosis of liver with ascites   Chronic hepatitis C with cirrhosis   Cardiac hypertrophy   Chronic liver failure   Abdominal edema on examination   Hepatitis, viral   Edema of abdominal wall   Pedal edema   Alcoholism   Discharge Condition: stable  Diet recommendation: Dysphagia 2  Filed Weights   08/30/14 0441 08/30/14 2019 09/02/14 0548  Weight: 121.2 kg (267 lb 3.2 oz) 95.437 kg (210 lb 6.4 oz) 103.103 kg (227 lb 4.8 oz)    History of present illness:  John [name] is a 59 y.o. BM PMHx Alcoholism, Alcoholic Liver Cirrhosis, coagulopathy, Polysubstance Abuse, accelerated hypertension, thrombocytopenia, who was recently discharged from the hospitalist service on 07/28/2014 at which time he was treated for new onset ascites undergoing paracentesis with removal of 2 L of ascitic fluid.  Presents to the emergency department with complaints of hematemesis over the past 3 days as well as bright red blood per rectum. He was noted have several episodes of bloody/maroon colored liquid consistency stools in the emergency department. CBC showed a hemoglobin of 5.4 with hematocrit of 16 and platelet count of 98,000. He has an INR 1.61 with PT of 19.3. He was  urgently typed and crossed and received his first unit of packed red blood cells while in the emergency department. Last blood pressure reading was 125/74 with a pulse of 80, satting 100% on 2 L up in oxygen via nasal cannula. Patient  also complains of abdominal pain located in the epigastric region. He takes Aleve as well as aspirin on a daily basis, and tells me he has only had one beer since his last hospitalization. I spoke with Dr. Fuller Plan of GI from the emergency department.  During patient's hospitalization patient was treated for bleeding esophageal varices secondary to alcoholic liver cirrhosis/HCV. In addition patient had altered metal status which improved with correction of metabolic abnormalities. On discharge patient's mental status is significantly improved, and hemoglobin level has been stable over several days.   Consultants: Dr.Malcolm T Fuller Plan (GI) Dr.Jaime Earleen Newport (IR)   Procedure/Significant Events: 3/31 echocardiogram;Left ventricle: moderate concentric hypertrophy.-LVEF= 60% to 65%. -(grade 1 diastolic dysfunction).- 4/18 transfused 3 units PRBC 4/18 S/P Balloon-occluded Retrograde Transvenous Obliteration (BRTO) 4/22 PCXR;Small left pleural effusion. Left perihilar and retrocardiac opacity may reflect atelectasis, pneumonia, or perhaps asymmetric edema 4/23 US guided paracentesis removed 1.7 L 5/5 US guided paracentesis removed 18ml 5/6 US guided paracentesis removed 4 L    Culture 4/18 MRSA by PCR negative 4/22 blood left hand 2 NGTD 4/22 urine negative 4/23 peritoneal fluid NGTD  4/24 blood left antecubital 2 NGTD 4/24 urine negative   Antibiotics: Ciprofloxacin 4/17>> stopped 4/21 Ciprofloxacin 4/25>>  Discharge Exam: Filed Vitals:   09/02/14 0936 09/02/14 0956 09/02/14 1113 09/02/14 1400  BP: 106/57 105/56 119/61 117/65  Pulse:      Temp:   98.4 F (36.9 C) 98.9 F (37.2 C)  TempSrc:   Oral Oral  Resp:   18 20  Height:      Weight:      SpO2:   100% 95%    General: A/O 4, NAD, follows all commands No acute respiratory distress Lungs: air movement in all lobes, mild rhonchi ,without wheezes or crackles Cardiovascular: Regular rate and rhythm  without murmur gallop or rub normal S1 and S2 Abdomen: Nontender to palpation, continued distention, softer to palpation than on 5/5, bowel sounds positive, no rebound, positive abdominal edema, positive ascites, no appreciable mass, negative fluid wave Extremities: No significant cyanosis, clubbing. 3+edema bilateral lower extremities   Discharge Instructions     Medication List    STOP taking these medications        ALEVE PO     amLODipine 10 MG tablet  Commonly known as:  NORVASC     aspirin 325 MG EC tablet     metoprolol tartrate 25 MG tablet  Commonly known as:  LOPRESSOR     traMADol 50 MG tablet  Commonly known as:  ULTRAM     UNABLE TO FIND      TAKE these medications        ciprofloxacin 500 MG tablet  Commonly known as:  CIPRO  Take 1 tablet (500 mg total) by mouth daily with breakfast.     folic acid 1 MG tablet  Commonly known as:  FOLVITE  Take 1 tablet (1 mg total) by mouth daily.     furosemide 20 MG tablet  Commonly known as:  LASIX  Take 3 tablets (60 mg total) by mouth daily.     lactulose 10 GM/15ML solution  Commonly known as:  CHRONULAC  Take 45 mLs (30 g total) by mouth 3 (three) times daily.  pantoprazole 40 MG tablet  Commonly known as:  PROTONIX  Take 1 tablet (40 mg total) by mouth daily.     rifaximin 200 MG tablet  Commonly known as:  XIFAXAN  Take 2 tablets (400 mg total) by mouth 3 (three) times daily.     spironolactone 50 MG tablet  Commonly known as:  ALDACTONE  Take 1 tablet (50 mg total) by mouth daily.     thiamine 500 MG tablet  Take 1 tablet (500 mg total) by mouth daily.       Allergies  Allergen Reactions  . Penicillins     Convulsions   Follow-up Information    Follow up with [facility].   Contact information:   [place] [id number] [phone]      Follow up with Norberto Sorenson T. Fuller Plan, MD. Schedule an appointment as soon as possible  for a visit in 4 weeks.   Specialty:  Gastroenterology   Why:  Follow-up with Follow-up Dr.Malcolm T Stark (GI), in one month for GI bleed secondary to gastric varices secondary to alcohol cirrhosis    Contact information:   [address] [place] [address] [phone]        The results of significant diagnostics from this hospitalization (including imaging, microbiology, ancillary and laboratory) are listed below for reference.    Significant Diagnostic Studies: Mr Brain Wo Contrast  08/31/2014   CLINICAL DATA:  Optic neuritis. Alcoholic with Korsakoff syndrome. Polysubstance abuse.  EXAM: MRI HEAD WITHOUT CONTRAST  TECHNIQUE: Multiplanar, multiecho pulse sequences of the brain and surrounding structures were obtained without intravenous contrast.  COMPARISON:  MR brain 06/24/2014.  FINDINGS: The patient was unable to remain motionless for the exam. Small or subtle lesions could be overlooked.  Small focus of restricted diffusion is seen in the periventricular white matter adjacent to the occipital horn RIGHT lateral ventricle as seen on image 22 series 4 not present previously. In addition, there is a tiny area of restricted diffusion adjacent to the RIGHT red nucleus. No restricted diffusion in the mamillary bodies or periaqueductal gray matter.  BILATERAL thalamic hyperintensity for cysts body is slightly less, favored represent resolving sequelae of worried Ki encephalopathy.  Premature for age atrophy. Moderate chronic microvascular ischemic change both focally and confluent. No corpus callosum necrosis. Flow voids are maintained. Tiny foci of chronic hemorrhage better visualized on previous exam.  LEFT optic nerve is insufficiently evaluated on this exam due to motion. It is possible that the LEFT optic neuritis has improved however.  IMPRESSION: Tiny foci of acute infarction have developed since the previous MR. These involve the RIGHT occipital periventricular white matter and RIGHT  midbrain. Etiology unclear.  Slight improvement and BILATERAL thalamic hyperintensity without other typical sequelae of Wernicke's encephalopathy; nevertheless favored to represent sequelae of alcohol abuse.  No clear progression of LEFT optic neuritis; if anything LEFT optic nerve appears equal to the RIGHT.   Electronically Signed   By: Rolla Flatten M.D.   On: 08/31/2014 12:58   Ir Angiogram Selective Each Additional Vessel  08/16/2014   CLINICAL DATA:  Hepatitis-C, cirrhosis, portal hypertension, 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 Daryll Brod, MD  Guidance:  Ultrasound and fluoroscopic  FLUOROSCOPY TIME:  28.3 minutes, 2,586 mGy  MEDICATIONS AND MEDICAL HISTORY:  General  ANESTHESIA/SEDATION: General  CONTRAST:  30mL OMNIPAQUE IOHEXOL 300 MG/ML  SOLN  COMPLICATIONS: None immediate  PROCEDURE: Informed consent was obtained from the patient following explanation of the procedure, risks, benefits and alternatives. The patient understands, agrees and consents for the procedure. All questions were addressed. A time out was performed.  Maximal barrier sterile technique utilized including caps, mask, sterile gowns, sterile gloves, large sterile drape, hand hygiene, and Betadine.  Under sterile conditions and local anesthesia, ultrasound micropuncture right common femoral venous access performed. Images obtained for documentation. Over a Bentson guidewire, a 56 [place] TIPS sheath was advanced into the IVC at the level of the left renal vein. C2 catheter was utilized to access the left renal vein. Selective left renal venogram confirms patency of the left renal vein. A Rosen guidewire was advanced peripherally into the left renal lower pole branches. Parallel to the [facility] guidewire, a Kumpe catheter was utilized to  select the left gastro renal shunt. Catheter was advanced into the left gastro renal shunt over a Glidewire. Selective venogram performed of the gastro renal shunt in a retrograde fashion. This confirms position in the gastro renal shunt. An Amplatz guidewire was coiled in the gastro renal shunt. Over the Amplatz guidewire, the occlusion balloon was advanced into the gastro renal shunt. The 10 French sheath was also advanced to the origin of the gastro renal shunt to secure position. Balloon occlusion retrograde venogram performed. Venograms were performed with contrast and with CO2.  Initial venogram demonstrates small inferior phrenic veins with collateral communication to the IVC.  The Lantern micro catheter was advanced over a phathom guidewire more peripherally into the gastro renal shunt. Repeat venogram confirms patency of the small inferior phrenic collaterals communicating to the IVC. The small collateral pathways were accessed with the micro catheter. Micro coil embolization was performed of the dominant feeding venous channel to the collateral phrenic to IVC pathway. 2 5 mm x 30 mm POD micro coils were deployed within the collateral venous channel.  Micro catheter was retracted and re- advanced into the gastro renal shunt peripherally. Contrast injection performed with contrast and with CO2. This demonstrates preferential flow in a retrograde fashion into the dominant gastric varices. From this position, sclerotherapy was performed with 3% STS, Lipiodol, and air mixture. A total of 20 cc of sclerotherapy mixture injected in a retrograde fashion. There was good dispersal into the gastric varices.  Micro catheter was retracted close to the occlusion balloon position. Contrast injection at this level demonstrates extravasation of contrast, suspect related to pressure from the occlusion balloon. From this position, 2 32 x 60 mm RUBY coils were deployed. Because of the venous extravasation/ injury at the  occlusion balloon site, the occlusion balloon will be left gently inflated through the sheath in this position overnight.  Patient was extubated and awakened. He was stable for recovery in the PACU. Overall he tolerated the procedure well.  IMPRESSION: Successful balloon occlusion retrograde trans venous obliteration of the gastric varices.   Electronically Signed   By: Jerilynn Mages.  Shick M.D.   On: 08/16/2014 08:52   Ir Angiogram Follow Up Study  08/16/2014   CLINICAL DATA:  Hepatitis-C, cirrhosis, portal hypertension, 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 Daryll Brod, MD  Guidance:  Ultrasound and fluoroscopic  FLUOROSCOPY TIME:  28.3 minutes, 2,586 mGy  MEDICATIONS AND MEDICAL HISTORY: General  ANESTHESIA/SEDATION: General  CONTRAST:  53mL OMNIPAQUE IOHEXOL 300 MG/ML  SOLN  COMPLICATIONS: None immediate  PROCEDURE: Informed consent was obtained from the patient following explanation of the procedure, risks, benefits and alternatives. The patient understands, agrees and consents for the procedure. All questions were addressed. A time out was performed.  Maximal barrier sterile technique utilized including caps, mask, sterile gowns, sterile gloves, large sterile drape, hand hygiene, and Betadine.  Under sterile conditions and local anesthesia, ultrasound micropuncture right common femoral venous access performed. Images obtained for documentation. Over a Bentson guidewire, a 45 [place] TIPS sheath was advanced into the IVC at the level of the left renal vein. C2 catheter was utilized to access the left renal vein. Selective left renal venogram confirms patency of the left renal vein. A Rosen guidewire was advanced peripherally into the left renal lower pole branches. Parallel to the [facility] guidewire, a Kumpe catheter was  utilized to select the left gastro renal shunt. Catheter was advanced into the left gastro renal shunt over a Glidewire. Selective venogram performed of the gastro renal shunt in a retrograde fashion. This confirms position in the gastro renal shunt. An Amplatz guidewire was coiled in the gastro renal shunt. Over the Amplatz guidewire, the occlusion balloon was advanced into the gastro renal shunt. The 10 French sheath was also advanced to the origin of the gastro renal shunt to secure position. Balloon occlusion retrograde venogram performed. Venograms were performed with contrast and with CO2.  Initial venogram demonstrates small inferior phrenic veins with collateral communication to the IVC.  The Lantern micro catheter was advanced over a phathom guidewire more peripherally into the gastro renal shunt. Repeat venogram confirms patency of the small inferior phrenic collaterals communicating to the IVC. The small collateral pathways were accessed with the micro catheter. Micro coil embolization was performed of the dominant feeding venous channel to the collateral phrenic to IVC pathway. 2 5 mm x 30 mm POD micro coils were deployed within the collateral venous channel.  Micro catheter was retracted and re- advanced into the gastro renal shunt peripherally. Contrast injection performed with contrast and with CO2. This demonstrates preferential flow in a retrograde fashion into the dominant gastric varices. From this position, sclerotherapy was performed with 3% STS, Lipiodol, and air mixture. A total of 20 cc of sclerotherapy mixture injected in a retrograde fashion. There was good dispersal into the gastric varices.  Micro catheter was retracted close to the occlusion balloon position. Contrast injection at this level demonstrates extravasation of contrast, suspect related to pressure from the occlusion balloon. From this position, 2 32 x 60 mm RUBY coils were deployed. Because of the venous extravasation/ injury  at the occlusion balloon site, the occlusion balloon will be left gently inflated through the sheath in this position overnight.  Patient was extubated and awakened. He was stable for recovery in the PACU. Overall he tolerated the procedure well.  IMPRESSION: Successful balloon occlusion retrograde trans venous obliteration of the gastric varices.   Electronically Signed   By: Jerilynn Mages.  Shick M.D.   On: 08/16/2014 08:52   US Paracentesis  09/02/2014   INDICATION: Recurrent ascites and request for paracentesis.  EXAM: ULTRASOUND-GUIDED PARACENTESIS  COMPARISON:  08/20/14 paracentesis.  MEDICATIONS: None.  COMPLICATIONS: None immediate  TECHNIQUE: Informed written consent was obtained from the patient after a discussion of the risks, benefits and alternatives to treatment. A timeout was performed prior to the initiation of the procedure.  Initial ultrasound scanning  demonstrates a large amount of ascites within the right upper abdominal quadrant. The right upper abdomen was prepped and draped in the usual sterile fashion. 1% lidocaine was used for local anesthesia.  An ultrasound image was saved for documentation purposed. A 6 Fr Safe-T-Centesis catheter was introduced. The paracentesis was performed. The catheter was removed and a dressing was applied. The patient tolerated the procedure well without immediate post procedural complication.  FINDINGS: A total of approximately 4 liters of serous fluid was removed. Samples were sent to the laboratory as requested by the clinical team.  IMPRESSION: Successful ultrasound-guided paracentesis yielding 4 liters of peritoneal fluid.  Read By:  Tsosie Billing PA-C   Electronically Signed   By: Markus Daft M.D.   On: 09/02/2014 12:43   US Paracentesis  08/20/2014   INDICATION: Ascites.  EXAM: ULTRASOUND-GUIDED PARACENTESIS  COMPARISON:  Previous para  MEDICATIONS: 10 cc 1% lidocaine  COMPLICATIONS: None immediate  TECHNIQUE: Informed written consent was obtained from the patient  after a discussion of the risks, benefits and alternatives to treatment. A timeout was performed prior to the initiation of the procedure.  Initial ultrasound scanning demonstrates a large amount of ascites within the right lower abdominal quadrant. The right lower abdomen was prepped and draped in the usual sterile fashion. 1% lidocaine with epinephrine was used for local anesthesia. Under direct ultrasound guidance, a 19 gauge, 7-cm, Yueh catheter was introduced. An ultrasound image was saved for documentation purposed. The paracentesis was performed. The catheter was removed and a dressing was applied. The patient tolerated the procedure well without immediate post procedural complication.  FINDINGS: A total of approximately 1.7 liters of yellow fluid was removed.  IMPRESSION: Successful ultrasound-guided paracentesis yielding 1.7 liters of peritoneal fluid.  Read by:  Lavonia Drafts [facility]   Electronically Signed   By: Markus Daft M.D.   On: 08/20/2014 11:37   Ir Fluoro Rm 30-60 Min  08/16/2014   CLINICAL DATA:  Post BRTO procedure performed secondary to bleeding gastric varices (4/18). Patient returns today for fluoroscopic guided removal of the occlusion balloon and guiding vascular sheath.  EXAM: IR FLOURO RM 0-60 MIN  MEDICATIONS: None  CONTRAST:  None  FLUOROSCOPY TIME:  12 seconds (7.4 mGy).  COMPARISON:  BRTO procedure - 08/15/2014; CT abdomen and pelvis- 07/26/2014  TECHNIQUE: Patient was placed supine on the fluoroscopy table.  A fluoroscopic image was obtained of the left upper abdominal quadrant.  Under intermittent fluoroscopic guidance, the occlusion balloon was deflated and the balloon an guiding vascular sheath were removed. A post removal fluoroscopic image was obtained.  Hemostasis was achieved at the right groin access site with manual compression. A dressing was placed. The patient tolerated the procedure well without immediate postprocedural complication.  FINDINGS: Preprocedural spot  fluoroscopic image demonstrates unchanged positioning of the sclerosant as well as embolization coil packs overlying the expected location of the gastric fundus.  The sclerosant and embolization coil packs are unchanged in appearance following successful fluoroscopic guided removal of both the occlusion balloon an guiding vascular sheath.  IMPRESSION: Successful fluoroscopic guided removal of occlusion balloon and guiding vascular sheath post BRT0 procedure.   Electronically Signed   By: Sandi Mariscal M.D.   On: 08/16/2014 09:10   Ir US Guide Vasc Access Right  08/16/2014   CLINICAL DATA:  Hepatitis-C, cirrhosis, portal hypertension, bleeding gastric varices with a patent gastro renal shunt  EXAM: ULTRASOUND GUIDANCE FOR VASCULAR ACCESS  BALLOON OCCLUSION RETROGRADE TRANSVENOUS OBLITERATION OF GASTRIC VARICES (BRTO)  Date:  4/18/20164/18/2016 6:05 pm  Radiologist:  M. Daryll Brod, MD  Guidance:  Ultrasound and fluoroscopic  FLUOROSCOPY TIME:  28.3 minutes, 2,586 mGy  MEDICATIONS AND MEDICAL HISTORY: General  ANESTHESIA/SEDATION: General  CONTRAST:  14mL OMNIPAQUE IOHEXOL 300 MG/ML  SOLN  COMPLICATIONS: None immediate  PROCEDURE: Informed consent was obtained from the patient following explanation of the procedure, risks, benefits and alternatives. The patient understands, agrees and consents for the procedure. All questions were addressed. A time out was performed.  Maximal barrier sterile technique utilized including caps, mask, sterile gowns, sterile gloves, large sterile drape, hand hygiene, and Betadine.  Under sterile conditions and local anesthesia, ultrasound micropuncture right common femoral venous access performed. Images obtained for documentation. Over a Bentson guidewire, a 34 [place] TIPS sheath was advanced into the IVC at the level of the left renal vein. C2 catheter was utilized to access the left renal vein. Selective left renal venogram confirms patency of the left renal vein. A Rosen guidewire  was advanced peripherally into the left renal lower pole branches. Parallel to the [facility] guidewire, a Kumpe catheter was utilized to select the left gastro renal shunt. Catheter was advanced into the left gastro renal shunt over a Glidewire. Selective venogram performed of the gastro renal shunt in a retrograde fashion. This confirms position in the gastro renal shunt. An Amplatz guidewire was coiled in the gastro renal shunt. Over the Amplatz guidewire, the occlusion balloon was advanced into the gastro renal shunt. The 10 French sheath was also advanced to the origin of the gastro renal shunt to secure position. Balloon occlusion retrograde venogram performed. Venograms were performed with contrast and with CO2.  Initial venogram demonstrates small inferior phrenic veins with collateral communication to the IVC.  The Lantern micro catheter was advanced over a phathom guidewire more peripherally into the gastro renal shunt. Repeat venogram confirms patency of the small inferior phrenic collaterals communicating to the IVC. The small collateral pathways were accessed with the micro catheter. Micro coil embolization was performed of the dominant feeding venous channel to the collateral phrenic to IVC pathway. 2 5 mm x 30 mm POD micro coils were deployed within the collateral venous channel.  Micro catheter was retracted and re- advanced into the gastro renal shunt peripherally. Contrast injection performed with contrast and with CO2. This demonstrates preferential flow in a retrograde fashion into the dominant gastric varices. From this position, sclerotherapy was performed with 3% STS, Lipiodol, and air mixture. A total of 20 cc of sclerotherapy mixture injected in a retrograde fashion. There was good dispersal into the gastric varices.  Micro catheter was retracted close to the occlusion balloon position. Contrast injection at this level demonstrates extravasation of contrast, suspect related to pressure from the  occlusion balloon. From this position, 2 32 x 60 mm RUBY coils were deployed. Because of the venous extravasation/ injury at the occlusion balloon site, the occlusion balloon will be left gently inflated through the sheath in this position overnight.  Patient was extubated and awakened. He was stable for recovery in the PACU. Overall he tolerated the procedure well.  IMPRESSION: Successful balloon occlusion retrograde trans venous obliteration of the gastric varices.   Electronically Signed   By: Jerilynn Mages.  Shick M.D.   On: 08/16/2014 08:52   Dg Chest Port 1 View  08/21/2014   CLINICAL DATA:  Altered mental status. History of hypertension and CHF.  EXAM: PORTABLE CHEST - 1 VIEW  COMPARISON:  Earlier today.  FINDINGS: Stable enlarged cardiac silhouette  with a prominent left ventricular contour. Clear lungs. Right PICC tip in the mid superior vena cava. Unremarkable bones.  IMPRESSION: 1. Stable cardiomegaly with left ventricular hypertrophy. 2. Right PICC tip in the mid superior vena cava.   Electronically Signed   By: Claudie Revering M.D.   On: 08/21/2014 11:53   Dg Chest Port 1 View  08/21/2014   CLINICAL DATA:  Followup left lower lobe atelectasis and/or pneumonia.  EXAM: PORTABLE CHEST - 1 VIEW  COMPARISON:  Portable chest x-ray 08/19/2014, 06/24/2014. Two-view chest x-ray 05/26/2014.  FINDINGS: Cardiac silhouette moderately enlarged, unchanged. Improved aeration in the left lower lobe since 2 days ago, as the left hemidiaphragm is no longer silhouetted. Lungs now clear. Pulmonary vascularity normal without evidence of pulmonary edema. No visible pleural effusions. Right arm PICC tip remains in the upper SVC.  IMPRESSION: 1. Resolution of left lower lobe atelectasis since examination 2 days ago. No acute cardiopulmonary disease currently. 2. Stable cardiomegaly without pulmonary edema.   Electronically Signed   By: Evangeline Dakin M.D.   On: 08/21/2014 09:48   Dg Chest Port 1 View  08/19/2014   CLINICAL DATA:   Pulmonary edema  EXAM: PORTABLE CHEST - 1 VIEW  COMPARISON:  06/24/2014  FINDINGS: Right PICC, with catheter tip projecting over the proximal SVC. Prominent cardiomediastinal contours. Right hilar calcified nodes. Mild left perihilar opacity. Retrocardiac opacity. Small left pleural effusion. No pneumothorax. No acute osseous finding. High attenuation left upper quadrant may reflect coils.  IMPRESSION: Prominent cardiomediastinal contours.  Small left pleural effusion. Left perihilar and retrocardiac opacity may reflect atelectasis, pneumonia, or perhaps asymmetric edema given the stated history.   Electronically Signed   By: Carlos Levering M.D.   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 Daryll Brod, MD  Guidance:  Ultrasound and fluoroscopic  FLUOROSCOPY TIME:  28.3 minutes, 2,586 mGy  MEDICATIONS AND MEDICAL HISTORY: General  ANESTHESIA/SEDATION: General  CONTRAST:  21mL OMNIPAQUE IOHEXOL 300 MG/ML  SOLN  COMPLICATIONS: None immediate  PROCEDURE: Informed consent was obtained from the patient following explanation of the procedure, risks, benefits and alternatives. The patient understands, agrees and consents for the procedure. All questions were addressed. A time out was performed.  Maximal barrier sterile technique utilized including caps, mask, sterile gowns, sterile gloves, large sterile drape, hand hygiene, and Betadine.  Under sterile conditions and local anesthesia, ultrasound micropuncture right common femoral venous access performed. Images obtained for documentation. Over a Bentson guidewire, a 6 [place] TIPS sheath was advanced into the IVC at the  level of the left renal vein. C2 catheter was utilized to access the left renal vein. Selective left renal venogram confirms patency of the left renal vein. A Rosen guidewire was advanced peripherally into the left renal lower pole branches. Parallel to the [facility] guidewire, a Kumpe catheter was utilized to select the left gastro renal shunt. Catheter was advanced into the left gastro renal shunt over a Glidewire. Selective venogram performed of the gastro renal shunt in a retrograde fashion. This confirms position in the gastro renal shunt. An Amplatz guidewire was coiled in the gastro renal shunt. Over the Amplatz guidewire, the occlusion balloon was advanced  into the gastro renal shunt. The 10 French sheath was also advanced to the origin of the gastro renal shunt to secure position. Balloon occlusion retrograde venogram performed. Venograms were performed with contrast and with CO2.  Initial venogram demonstrates small inferior phrenic veins with collateral communication to the IVC.  The Lantern micro catheter was advanced over a phathom guidewire more peripherally into the gastro renal shunt. Repeat venogram confirms patency of the small inferior phrenic collaterals communicating to the IVC. The small collateral pathways were accessed with the micro catheter. Micro coil embolization was performed of the dominant feeding venous channel to the collateral phrenic to IVC pathway. 2 5 mm x 30 mm POD micro coils were deployed within the collateral venous channel.  Micro catheter was retracted and re- advanced into the gastro renal shunt peripherally. Contrast injection performed with contrast and with CO2. This demonstrates preferential flow in a retrograde fashion into the dominant gastric varices. From this position, sclerotherapy was performed with 3% STS, Lipiodol, and air mixture. A total of 20 cc of sclerotherapy mixture injected in a retrograde fashion. There was good dispersal into the gastric varices.  Micro  catheter was retracted close to the occlusion balloon position. Contrast injection at this level demonstrates extravasation of contrast, suspect related to pressure from the occlusion balloon. From this position, 2 32 x 60 mm RUBY coils were deployed. Because of the venous extravasation/ injury at the occlusion balloon site, the occlusion balloon will be left gently inflated through the sheath in this position overnight.  Patient was extubated and awakened. He was stable for recovery in the PACU. Overall he tolerated the procedure well.  IMPRESSION: Successful balloon occlusion retrograde trans venous obliteration of the gastric varices.   Electronically Signed   By: Jerilynn Mages.  Shick M.D.   On: 08/16/2014 08:52    Microbiology: No results found for this or any previous visit (from the past 240 hour(s)).   Labs: Basic Metabolic Panel:  Recent Labs Lab 08/29/14 0635 08/30/14 0500 08/31/14 0505 09/01/14 0536 09/02/14 0330  NA 137 134* 133* 133* 136  K 4.1 4.0 4.1 4.2 4.1  CL 108 105 105 106 108  CO2 22 20* 20* 21* 20*  GLUCOSE 89 87 79 78 78  BUN 58* 56* 55* 51* 49*  CREATININE 2.78* 2.72* 2.67* 2.57* 2.62*  CALCIUM 7.6* 7.7* 7.6* 7.5* 7.9*   Liver Function Tests:  Recent Labs Lab 08/27/14 0440 08/30/14 0500 08/31/14 0505 09/01/14 0536 09/02/14 0330  AST 165* 161* 154* 163* 129*  ALT 55* 56 53 50 42  ALKPHOS 72 69 71 67 54  BILITOT 6.8* 5.9* 5.5* 4.5* 4.5*  PROT 7.4 8.1 7.5 7.3 7.3  ALBUMIN 1.6* 1.5* 1.5* 1.5* 1.9*   No results for input(s): LIPASE, AMYLASE in the last 168 hours.  Recent Labs Lab 08/29/14 0637 08/30/14 0500 08/31/14 0500 09/01/14 0536 09/02/14 0330  AMMONIA 30 34 40* 35 24   CBC:  Recent Labs Lab 08/29/14 0635 08/30/14 0500 08/31/14 0505 09/01/14 0536 09/02/14 0330  WBC 11.2* 11.1* 10.5 9.2 6.3  NEUTROABS 7.1 6.9 6.5 5.2 3.5  HGB 7.3* 7.6* 7.5* 7.3* 7.1*  HCT 21.7* 22.5* 22.5* 21.9* 21.6*  MCV 87.5 85.9 87.5 88.3 87.4  PLT 122* 121* 115* 104*  90*   Cardiac Enzymes: No results for input(s): CKTOTAL, CKMB, CKMBINDEX, TROPONINI in the last 168 hours. BNP: BNP (last 3 results)  Recent Labs  06/24/14 0802  BNP 1168.5*    ProBNP (last 3 results) No results  for input(s): PROBNP in the last 8760 hours.  CBG: No results for input(s): GLUCAP in the last 168 hours.     Signed:  Dia Crawford, MD [facility] [phone] pager

## 2014-09-02 NOTE — Clinical Social Work Placement (Addendum)
   CLINICAL SOCIAL WORK PLACEMENT  NOTE  Date:  09/02/2014  Patient Details  Name: John [name] MRN: [id number] Date of Birth: June 26, 1955  Clinical Social Work is seeking post-discharge placement for this patient at the [place] level of care (*CSW will initial, date and re-position this form in  chart as items are completed):  Yes   Patient/family provided with [place] Work Department's list of facilities offering this level of care within the geographic area requested by the patient (or if unable, by the patient's family).  Yes   Patient/family informed of their freedom to choose among providers that offer the needed level of care, that participate in Medicare, Medicaid or managed care program needed by the patient, have an available bed and are willing to accept the patient.  Yes   Patient/family informed of [facility]'s ownership interest in [facility] and [facility], as well as of the fact that they are under no obligation to receive care at these facilities.  PASRR submitted to EDS on 08/23/14     PASRR number received on 08/23/14     Existing PASRR number confirmed on       FL2 transmitted to all facilities in geographic area requested by pt/family on       FL2 transmitted to all facilities within larger geographic area on 08/23/14     Patient informed that his/her managed care company has contracts with or will negotiate with certain facilities, including the following:        Yes   Patient/family informed of bed offers received.  Patient chooses bed at Other - please specify in the comment section below: [facility])     Physician recommends and patient chooses bed at      Patient to be transferred to Other - please specify in the comment section below: on 09/02/14.  Patient to be transferred to facility by Ambulance     Patient family notified on 09/02/14 of transfer.  Name of family member notified:   Izell [place]     PHYSICIAN       Additional Comment:  Per MD patient ready for DC to [facility]. RN, patient, patient's family, and facility notified of DC. RN given number for report. DC packet on chart. Ambulance transport to be requested for patient by RN once lines and foley have been removed. [name] asked when patient will be moved. CSW explained that it will likely be hours before the patient is picked up due to late discharge. Annette with facility confirms that DC Summary was received. LOG also faxed to facility. CSW signing off.   _______________________________________________ Rigoberto Noel, LCSW 09/02/2014, 5:21 PM

## 2014-09-02 NOTE — Progress Notes (Signed)
Report called to [place] to [facility]

## 2014-09-02 NOTE — Procedures (Signed)
Successful US guided paracentesis from RUQ.  Yielded 4 liters of serous fluid.  No immediate complications.  Pt tolerated well.   Specimen was sent for labs.  Tsosie Billing D PA-C 09/02/2014 10:09 AM

## 2014-09-05 ENCOUNTER — Encounter: Payer: Self-pay | Admitting: Gastroenterology

## 2014-09-05 LAB — BODY FLUID CULTURE: CULTURE: NO GROWTH

## 2014-09-05 LAB — PATHOLOGIST SMEAR REVIEW: Path Review: REACTIVE

## 2014-09-07 LAB — ANAEROBIC CULTURE

## 2014-09-08 ENCOUNTER — Telehealth: Payer: Self-pay | Admitting: *Deleted

## 2014-09-08 NOTE — Telephone Encounter (Signed)
John [name] from [facility] called to let us know that they have been trying to contact patient but they are getting no answer and there is no voice mail.  I also attempted to contact patient and got no answer and no voicemail.

## 2014-09-08 NOTE — Telephone Encounter (Signed)
1 

## 2014-10-31 ENCOUNTER — Encounter (HOSPITAL_COMMUNITY): Payer: Self-pay | Admitting: *Deleted

## 2014-10-31 ENCOUNTER — Inpatient Hospital Stay (HOSPITAL_COMMUNITY)
Admission: EM | Admit: 2014-10-31 | Discharge: 2014-11-08 | DRG: 433 | Disposition: A | Discharge status: Home Health | Payer: Medicaid Other | Attending: Internal Medicine | Admitting: Internal Medicine

## 2014-10-31 ENCOUNTER — Emergency Department (HOSPITAL_COMMUNITY): Payer: Medicaid Other

## 2014-10-31 DIAGNOSIS — N179 Acute kidney failure, unspecified: Secondary | ICD-10-CM

## 2014-10-31 DIAGNOSIS — R0602 Shortness of breath: Secondary | ICD-10-CM | POA: Diagnosis present

## 2014-10-31 DIAGNOSIS — Z79899 Other long term (current) drug therapy: Secondary | ICD-10-CM | POA: Diagnosis not present

## 2014-10-31 DIAGNOSIS — Z9114 Patient's other noncompliance with medication regimen: Secondary | ICD-10-CM | POA: Diagnosis present

## 2014-10-31 DIAGNOSIS — B182 Chronic viral hepatitis C: Secondary | ICD-10-CM | POA: Diagnosis not present

## 2014-10-31 DIAGNOSIS — Z9119 Patient's noncompliance with other medical treatment and regimen: Secondary | ICD-10-CM | POA: Diagnosis not present

## 2014-10-31 DIAGNOSIS — F1410 Cocaine abuse, uncomplicated: Secondary | ICD-10-CM | POA: Diagnosis present

## 2014-10-31 DIAGNOSIS — K7031 Alcoholic cirrhosis of liver with ascites: Secondary | ICD-10-CM | POA: Diagnosis present

## 2014-10-31 DIAGNOSIS — F1910 Other psychoactive substance abuse, uncomplicated: Secondary | ICD-10-CM | POA: Diagnosis present

## 2014-10-31 DIAGNOSIS — N184 Chronic kidney disease, stage 4 (severe): Secondary | ICD-10-CM | POA: Diagnosis present

## 2014-10-31 DIAGNOSIS — F17290 Nicotine dependence, other tobacco product, uncomplicated: Secondary | ICD-10-CM | POA: Diagnosis present

## 2014-10-31 DIAGNOSIS — R7989 Other specified abnormal findings of blood chemistry: Secondary | ICD-10-CM | POA: Diagnosis present

## 2014-10-31 DIAGNOSIS — K7210 Chronic hepatic failure without coma: Secondary | ICD-10-CM | POA: Diagnosis present

## 2014-10-31 DIAGNOSIS — E8779 Other fluid overload: Secondary | ICD-10-CM | POA: Diagnosis not present

## 2014-10-31 DIAGNOSIS — K7040 Alcoholic hepatic failure without coma: Secondary | ICD-10-CM | POA: Diagnosis not present

## 2014-10-31 DIAGNOSIS — B1920 Unspecified viral hepatitis C without hepatic coma: Secondary | ICD-10-CM | POA: Diagnosis present

## 2014-10-31 DIAGNOSIS — I130 Hypertensive heart and chronic kidney disease with heart failure and stage 1 through stage 4 chronic kidney disease, or unspecified chronic kidney disease: Secondary | ICD-10-CM | POA: Diagnosis not present

## 2014-10-31 DIAGNOSIS — G8929 Other chronic pain: Secondary | ICD-10-CM | POA: Diagnosis present

## 2014-10-31 DIAGNOSIS — Z8673 Personal history of transient ischemic attack (TIA), and cerebral infarction without residual deficits: Secondary | ICD-10-CM | POA: Diagnosis not present

## 2014-10-31 DIAGNOSIS — I129 Hypertensive chronic kidney disease with stage 1 through stage 4 chronic kidney disease, or unspecified chronic kidney disease: Secondary | ICD-10-CM | POA: Diagnosis present

## 2014-10-31 DIAGNOSIS — D638 Anemia in other chronic diseases classified elsewhere: Secondary | ICD-10-CM | POA: Diagnosis present

## 2014-10-31 DIAGNOSIS — R9431 Abnormal electrocardiogram [ECG] [EKG]: Secondary | ICD-10-CM

## 2014-10-31 DIAGNOSIS — D696 Thrombocytopenia, unspecified: Secondary | ICD-10-CM | POA: Diagnosis present

## 2014-10-31 DIAGNOSIS — K766 Portal hypertension: Secondary | ICD-10-CM | POA: Diagnosis present

## 2014-10-31 DIAGNOSIS — I5030 Unspecified diastolic (congestive) heart failure: Secondary | ICD-10-CM | POA: Diagnosis present

## 2014-10-31 DIAGNOSIS — M549 Dorsalgia, unspecified: Secondary | ICD-10-CM | POA: Diagnosis present

## 2014-10-31 DIAGNOSIS — I10 Essential (primary) hypertension: Secondary | ICD-10-CM | POA: Diagnosis present

## 2014-10-31 DIAGNOSIS — R600 Localized edema: Secondary | ICD-10-CM

## 2014-10-31 DIAGNOSIS — N189 Chronic kidney disease, unspecified: Secondary | ICD-10-CM | POA: Diagnosis present

## 2014-10-31 DIAGNOSIS — E8770 Fluid overload, unspecified: Secondary | ICD-10-CM

## 2014-10-31 DIAGNOSIS — R945 Abnormal results of liver function studies: Secondary | ICD-10-CM | POA: Diagnosis present

## 2014-10-31 DIAGNOSIS — F1020 Alcohol dependence, uncomplicated: Secondary | ICD-10-CM | POA: Diagnosis present

## 2014-10-31 DIAGNOSIS — R188 Other ascites: Secondary | ICD-10-CM | POA: Diagnosis present

## 2014-10-31 DIAGNOSIS — R109 Unspecified abdominal pain: Secondary | ICD-10-CM

## 2014-10-31 LAB — CBC WITH DIFFERENTIAL/PLATELET
BASOS ABS: 0 10*3/uL (ref 0.0–0.1)
Basophils Relative: 0 % (ref 0–1)
EOS ABS: 0 10*3/uL (ref 0.0–0.7)
EOS PCT: 0 % (ref 0–5)
HCT: 24 % — ABNORMAL LOW (ref 39.0–52.0)
Hemoglobin: 8.1 g/dL — ABNORMAL LOW (ref 13.0–17.0)
LYMPHS ABS: 1.5 10*3/uL (ref 0.7–4.0)
LYMPHS PCT: 18 % (ref 12–46)
MCH: 26.4 pg (ref 26.0–34.0)
MCHC: 33.8 g/dL (ref 30.0–36.0)
MCV: 78.2 fL (ref 78.0–100.0)
Monocytes Absolute: 1.5 10*3/uL — ABNORMAL HIGH (ref 0.1–1.0)
Monocytes Relative: 17 % — ABNORMAL HIGH (ref 3–12)
NEUTROS PCT: 65 % (ref 43–77)
Neutro Abs: 5.5 10*3/uL (ref 1.7–7.7)
PLATELETS: 120 10*3/uL — AB (ref 150–400)
RBC: 3.07 MIL/uL — ABNORMAL LOW (ref 4.22–5.81)
RDW: 17.5 % — AB (ref 11.5–15.5)
WBC: 8.5 10*3/uL (ref 4.0–10.5)

## 2014-10-31 LAB — URINALYSIS, ROUTINE W REFLEX MICROSCOPIC
Glucose, UA: NEGATIVE mg/dL
HGB URINE DIPSTICK: NEGATIVE
Ketones, ur: 15 mg/dL — AB
Nitrite: NEGATIVE
PH: 5 (ref 5.0–8.0)
Protein, ur: 30 mg/dL — AB
Specific Gravity, Urine: 1.018 (ref 1.005–1.030)
Urobilinogen, UA: 1 mg/dL (ref 0.0–1.0)

## 2014-10-31 LAB — COMPREHENSIVE METABOLIC PANEL
ALT: 22 U/L (ref 17–63)
AST: 71 U/L — ABNORMAL HIGH (ref 15–41)
Albumin: 2.3 g/dL — ABNORMAL LOW (ref 3.5–5.0)
Alkaline Phosphatase: 67 U/L (ref 38–126)
Anion gap: 9 (ref 5–15)
BUN: 27 mg/dL — AB (ref 6–20)
CALCIUM: 8.3 mg/dL — AB (ref 8.9–10.3)
CO2: 18 mmol/L — ABNORMAL LOW (ref 22–32)
Chloride: 107 mmol/L (ref 101–111)
Creatinine, Ser: 3.25 mg/dL — ABNORMAL HIGH (ref 0.61–1.24)
GFR, EST AFRICAN AMERICAN: 23 mL/min — AB (ref >60)
GFR, EST NON AFRICAN AMERICAN: 20 mL/min — AB (ref >60)
Glucose, Bld: 105 mg/dL — ABNORMAL HIGH (ref 65–99)
POTASSIUM: 4.7 mmol/L (ref 3.5–5.1)
Sodium: 134 mmol/L — ABNORMAL LOW (ref 135–145)
Total Bilirubin: 2.4 mg/dL — ABNORMAL HIGH (ref 0.3–1.2)
Total Protein: 8.7 g/dL — ABNORMAL HIGH (ref 6.5–8.1)

## 2014-10-31 LAB — I-STAT TROPONIN, ED: Troponin i, poc: 0.03 ng/mL (ref 0.00–0.08)

## 2014-10-31 LAB — URINE MICROSCOPIC-ADD ON

## 2014-10-31 LAB — RAPID URINE DRUG SCREEN, HOSP PERFORMED
Amphetamines: NOT DETECTED
BENZODIAZEPINES: NOT DETECTED
Barbiturates: NOT DETECTED
Cocaine: POSITIVE — AB
Opiates: NOT DETECTED
Tetrahydrocannabinol: NOT DETECTED

## 2014-10-31 LAB — AMMONIA: AMMONIA: 48 umol/L — AB (ref 9–35)

## 2014-10-31 LAB — ETHANOL: Alcohol, Ethyl (B): 6 mg/dL — ABNORMAL HIGH (ref <5)

## 2014-10-31 LAB — BRAIN NATRIURETIC PEPTIDE: B NATRIURETIC PEPTIDE 5: 398.9 pg/mL — AB (ref 0.0–100.0)

## 2014-10-31 MED ORDER — FOLIC ACID 5 MG/ML IJ SOLN
1.0000 mg | Freq: Every day | INTRAMUSCULAR | Status: DC
  Administered 2014-11-01 – 2014-11-03 (×3): 1 mg via INTRAVENOUS
  Filled 2014-10-31 (×5): qty 0.2

## 2014-10-31 MED ORDER — ACETAMINOPHEN 325 MG PO TABS: 650.0000 mg | ORAL_TABLET | Freq: Four times a day (QID) | ORAL | Status: DC | PRN

## 2014-10-31 MED ORDER — LORAZEPAM 2 MG/ML IJ SOLN: 1.0000 mg | Freq: Four times a day (QID) | INTRAMUSCULAR | Status: AC | PRN

## 2014-10-31 MED ORDER — ALBUTEROL SULFATE (2.5 MG/3ML) 0.083% IN NEBU
5.0000 mg | INHALATION_SOLUTION | Freq: Once | RESPIRATORY_TRACT | Status: AC
  Administered 2014-10-31: 5 mg via RESPIRATORY_TRACT
  Filled 2014-10-31: qty 6

## 2014-10-31 MED ORDER — FUROSEMIDE 10 MG/ML IJ SOLN
40.0000 mg | Freq: Once | INTRAMUSCULAR | Status: AC
  Administered 2014-10-31: 40 mg via INTRAVENOUS
  Filled 2014-10-31: qty 4

## 2014-10-31 MED ORDER — THIAMINE HCL 100 MG/ML IJ SOLN
100.0000 mg | Freq: Every day | INTRAMUSCULAR | Status: DC
  Administered 2014-11-01 – 2014-11-03 (×3): 100 mg via INTRAVENOUS
  Filled 2014-10-31 (×4): qty 1

## 2014-10-31 MED ORDER — LORAZEPAM 1 MG PO TABS: 1.0000 mg | ORAL_TABLET | Freq: Four times a day (QID) | ORAL | Status: AC | PRN

## 2014-10-31 MED ORDER — HEPARIN SODIUM (PORCINE) 5000 UNIT/ML IJ SOLN
5000.0000 [IU] | Freq: Three times a day (TID) | INTRAMUSCULAR | Status: DC
  Administered 2014-10-31 – 2014-11-01 (×2): 5000 [IU] via SUBCUTANEOUS
  Filled 2014-10-31 (×5): qty 1

## 2014-10-31 MED ORDER — ACETAMINOPHEN 500 MG PO TABS: 500.0000 mg | ORAL_TABLET | Freq: Four times a day (QID) | ORAL | Status: DC | PRN

## 2014-10-31 MED ORDER — ACETAMINOPHEN 650 MG RE SUPP: 325.0000 mg | Freq: Four times a day (QID) | RECTAL | Status: DC | PRN

## 2014-10-31 MED ORDER — PROMETHAZINE HCL 25 MG PO TABS: 12.5000 mg | ORAL_TABLET | Freq: Four times a day (QID) | ORAL | Status: DC | PRN

## 2014-10-31 MED ORDER — ACETAMINOPHEN 650 MG RE SUPP: 650.0000 mg | Freq: Four times a day (QID) | RECTAL | Status: DC | PRN

## 2014-10-31 MED ORDER — LACTULOSE ENEMA
300.0000 mL | Freq: Once | ORAL | Status: AC
  Administered 2014-10-31: 300 mL via RECTAL
  Filled 2014-10-31: qty 300

## 2014-10-31 NOTE — ED Notes (Signed)
Pt reports not taking his Lasik x 1 month d/t leaving his clinic.

## 2014-10-31 NOTE — ED Notes (Signed)
PA at bedside.

## 2014-10-31 NOTE — ED Notes (Signed)
Patient transported to X-ray 
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
## 2014-10-31 NOTE — ED Provider Notes (Signed)
CSN: [id number]     Arrival date & time 10/31/14  1806 History   First MD Initiated Contact with Patient 10/31/14 1808     Chief Complaint  Patient presents with  . Shortness of Breath     (Consider location/radiation/quality/duration/timing/severity/associated sxs/prior Treatment) Patient is a 59 y.o. male presenting with shortness of breath. The history is provided by the patient and medical records. No language interpreter was used.  Shortness of Breath Associated symptoms: abdominal pain (intermittent)   Associated symptoms: no chest pain, no cough, no diaphoresis, no fever, no headaches, no rash, no vomiting and no wheezing      Federico Maiorino is a 59 y.o. male  with a hx of HTN, chronic back pain, CVA, polysubstance abuse including EtOH, CHF, ascites presents to the Emergency Department complaining of gradual, persistent, progressively worsening SOB onset 2 days ago. Associated symptoms include severe DOE, bilateral LE edema, decreased appetite and generalized weakness.  Pt reports he is prescribed lactulose and lasix, but has not been taking it in the last 4 weeks.  Patient reports that he has not taken any of his medications except for his hypertension medications. Nothing makes it better and nothing makes it worse.  Pt denies fever, chills, headache, neck pain, chest pain, nausea, vomiting, diarrhea, dizziness, syncope, dysuria. Last paracentesis 2 mos ago.  Pt reports intermittent abd pain, but not currently present.     Past Medical History  Diagnosis Date  . Hypertension   . Chronic back pain   . Stroke 1998  . Polysubstance abuse   . Optic neuropathy, left   . CHF (congestive heart failure)   . Ascites   . ETOH abuse    Past Surgical History  Procedure Laterality Date  . None    . Esophagogastroduodenoscopy N/A 08/14/2014    Procedure: ESOPHAGOGASTRODUODENOSCOPY (EGD);  Surgeon: Ladene Artist, MD;  Location: [facility];  Service: Endoscopy;  Laterality: N/A;  .  Radiology with anesthesia N/A 08/15/2014    Procedure: RADIOLOGY WITH ANESTHESIA;  Surgeon: Greggory Keen, MD;  Location: [facility];  Service: Radiology;  Laterality: N/A;   Family History  Problem Relation Age of Onset  . Hypertension Mother     Living  . Hypertension Brother   . Kidney disease Mother   . Kidney disease Brother   . Hypertension Father   . Hypertension Father     Deceased, 74  . Hypertension Sister   . Ulcers Father    History  Substance Use Topics  . Smoking status: Former Smoker -- 0.30 packs/day for 20 years    Types: Cigarettes  . Smokeless tobacco: Current User    Types: Snuff     Comment: 2015  . Alcohol Use: 0.0 oz/week    0 Standard drinks or equivalent per week     Comment: occ    Review of Systems  Constitutional: Positive for appetite change. Negative for fever, diaphoresis, fatigue and unexpected weight change.  HENT: Negative for mouth sores.   Eyes: Negative for visual disturbance.  Respiratory: Positive for shortness of breath. Negative for cough and wheezing.   Cardiovascular: Positive for leg swelling. Negative for chest pain.  Gastrointestinal: Positive for abdominal pain (intermittent) and abdominal distention. Negative for nausea, vomiting, diarrhea and constipation.  Endocrine: Negative for polydipsia, polyphagia and polyuria.  Genitourinary: Negative for dysuria, urgency, frequency and hematuria.  Musculoskeletal: Negative for back pain and neck stiffness.  Skin: Negative for rash.  Allergic/Immunologic: Negative for immunocompromised state.  Neurological: Negative for syncope,  light-headedness and headaches.  Hematological: Does not bruise/bleed easily.  Psychiatric/Behavioral: Negative for sleep disturbance. The patient is not nervous/anxious.       Allergies  Penicillins  Home Medications   Prior to Admission medications   Medication Sig Start Date End Date Taking? Authorizing Provider  spironolactone (ALDACTONE) 50 MG tablet  Take 1 tablet (50 mg total) by mouth daily. 07/28/14  Yes Padmaja Vasireddy, MD  ciprofloxacin (CIPRO) 500 MG tablet Take 1 tablet (500 mg total) by mouth daily with breakfast. Patient not taking: Reported on 10/31/2014 09/02/14   Allie Bossier, MD  folic acid (FOLVITE) 1 MG tablet Take 1 tablet (1 mg total) by mouth daily. Patient not taking: Reported on 10/31/2014 09/02/14   Allie Bossier, MD  furosemide (LASIX) 20 MG tablet Take 3 tablets (60 mg total) by mouth daily. Patient not taking: Reported on 10/31/2014 09/02/14   Allie Bossier, MD  lactulose (CHRONULAC) 10 GM/15ML solution Take 45 mLs (30 g total) by mouth 3 (three) times daily. Patient not taking: Reported on 10/31/2014 09/02/14   Allie Bossier, MD  pantoprazole (PROTONIX) 40 MG tablet Take 1 tablet (40 mg total) by mouth daily. Patient not taking: Reported on 10/31/2014 09/02/14   Allie Bossier, MD  rifaximin (XIFAXAN) 200 MG tablet Take 2 tablets (400 mg total) by mouth 3 (three) times daily. Patient not taking: Reported on 10/31/2014 09/02/14   Allie Bossier, MD  thiamine 500 MG tablet Take 1 tablet (500 mg total) by mouth daily. Patient not taking: Reported on 10/31/2014 09/02/14   Allie Bossier, MD   BP 159/83 mmHg  Pulse 104  Temp(Src) 98.3 F (36.8 C) (Oral)  Resp 22  Ht 5\' 10"  (1.778 m)  Wt 230 lb (104.327 kg)  BMI 33.00 kg/m2  SpO2 100% Physical Exam  Constitutional: He appears well-developed and well-nourished. No distress.  Awake, alert, nontoxic appearance  HENT:  Head: Normocephalic and atraumatic.  Mouth/Throat: Oropharynx is clear and moist. No oropharyngeal exudate.  Eyes: Conjunctivae are normal. No scleral icterus.  Neck: Normal range of motion. Neck supple.  Cardiovascular: Regular rhythm, normal heart sounds and intact distal pulses.  Tachycardia present.   Pulses:      Radial pulses are 2+ on the right side, and 2+ on the left side.  Pulmonary/Chest: Accessory muscle usage present. Tachypnea noted. No respiratory  distress. He has decreased breath sounds. He has wheezes. He has no rhonchi. He has no rales. He exhibits no tenderness.  Equal chest expansion Increased work of breathing Fine expiratory wheezes No audible rhonchi or rales  Abdominal: Soft. Bowel sounds are normal. He exhibits distension, fluid wave and ascites. He exhibits no mass. There is no tenderness. There is no rebound and no guarding.  Mild anasarca Significant abdominal distention with ascites and positive fluid wave No abdominal tenderness  Musculoskeletal: Normal range of motion. He exhibits edema.  Pitting edema noted along the length of bilateral lower extremities more chronic appearing in the bilateral lower legs.   Neurological: He is alert.  Speech is clear and goal oriented Moves extremities without ataxia  Skin: Skin is warm and dry. He is not diaphoretic.  Several open wounds on the bilateral lower extremities using ferrous fluid  Psychiatric: He has a normal mood and affect.  Nursing note and vitals reviewed.   ED Course  Procedures (including critical care time) Labs Review Labs Reviewed  CBC WITH DIFFERENTIAL/PLATELET - Abnormal; Notable for the following:    RBC  3.07 (*)    Hemoglobin 8.1 (*)    HCT 24.0 (*)    RDW 17.5 (*)    Platelets 120 (*)    Monocytes Relative 17 (*)    Monocytes Absolute 1.5 (*)    All other components within normal limits  COMPREHENSIVE METABOLIC PANEL - Abnormal; Notable for the following:    Sodium 134 (*)    CO2 18 (*)    Glucose, Bld 105 (*)    BUN 27 (*)    Creatinine, Ser 3.25 (*)    Calcium 8.3 (*)    Total Protein 8.7 (*)    Albumin 2.3 (*)    AST 71 (*)    Total Bilirubin 2.4 (*)    GFR calc non Af Amer 20 (*)    GFR calc Af Amer 23 (*)    All other components within normal limits  BRAIN NATRIURETIC PEPTIDE - Abnormal; Notable for the following:    B Natriuretic Peptide 398.9 (*)    All other components within normal limits  AMMONIA - Abnormal; Notable for the  following:    Ammonia 48 (*)    All other components within normal limits  ETHANOL - Abnormal; Notable for the following:    Alcohol, Ethyl (B) 6 (*)    All other components within normal limits  URINALYSIS, ROUTINE W REFLEX MICROSCOPIC (NOT AT [facility])  URINE RAPID DRUG SCREEN, HOSP PERFORMED  I-STAT TROPOININ, ED    Imaging Review Dg Chest 2 View  10/31/2014   CLINICAL DATA:  Shortness of breath for 2 days.  EXAM: CHEST  2 VIEW  COMPARISON:  August 21, 2014  FINDINGS: The mediastinal contour is normal. The heart size is enlarged. There is mild diffuse increased interstitial markings in both lungs. There is no focal pneumonia or pleural effusion. The visualized skeletal structures are stable.  IMPRESSION: Mild increased pulmonary interstitium bilaterally. Differential diagnosis includes interstitial edema or infectious/viral etiology.   Electronically Signed   By: Abelardo Diesel M.D.   On: 10/31/2014 20:28     EKG Interpretation   Date/Time:  Monday October 31 2014 18:20:41 EDT Ventricular Rate:  100 PR Interval:    QRS Duration: 78 QT Interval:  410 QTC Calculation: 529 R Axis:   23 Text Interpretation:  Atrial fibrillation Abnormal T, consider ischemia,  lateral leads Prolonged QT interval No significant change since last  tracing Confirmed by Debby Freiberg [phone]) on 10/31/2014 8:01:12 PM      MDM   Final diagnoses:  SOB (shortness of breath)  Hypervolemia, unspecified hypervolemia type  Edema of abdominal wall  Alcoholism  Chronic liver failure  Alcoholic cirrhosis of liver with ascites  Acute on chronic renal failure    Hassell Patras presents with increasing shortness of breath along with increasing abdominal distention. Afebrile without abdominal tenderness. Minimal concern for SBP.  Patient shortness of breath likely secondary to his fluid overload and distended abdomen. He will need therapeutic paracentesis. We'll begin workup for CHF.   9:12 PM Normal troponin,  mild anemia at baseline, evidence of acute on chronic kidney disease with creatinine of 3.25 today up from 2.6. Mildly elevated BNP at 398 and slightly elevated ammonia at 48. Chest x-ray with  increased pulmonary interstitium bilaterally but no diffuse pulmonary edema. Patient remains tachypneic and will need therapeutic paracentesis.  9:31 PM Discussed with Dr. Genene Churn of Internal medicine who will admit to medicine.    The patient was discussed with and seen by Dr. Colin Rhein who agrees with  the treatment plan.   Jarrett Soho Samika Vetsch, PA-C 10/31/14 2135  Debby Freiberg, MD 11/01/14 [phone]

## 2014-10-31 NOTE — ED Notes (Signed)
MD at bedside. 

## 2014-10-31 NOTE — H&P (Signed)
Date: 10/31/2014               Patient Name:  John [name] MRN: [id number]  DOB: 11/09/1955 Age / Sex: 59 y.o., male   PCP: No Pcp Per Patient         Medical Service: Internal Medicine Teaching Service         Attending Physician: Dr. Oval Linsey, MD    First Contact: Dr. Charlynn Grimes Pager: [phone]  Second Contact: Dr. Denton Brick Pager: [phone]       After Hours (After 5p/  First Contact Pager: [phone]  weekends / holidays): Second Contact Pager: [phone]   Chief Complaint: SOB  History of Present Illness: [name] is 59 yo male with HTN, alcoholic and HCV cirrhosis, polysubstance abuse, and CHF, presenting with 3 day history of worsening shortness of breath.  He reports worsening shortness of breath at rest.  He denies cough or chest pain associated with the dyspnea.  He also reports slowly enlarging abdomen for the past month.  His abdomen is nontender, but significantly distended.  He is prescribed Lactulose and Lasix, but has not been taking them for 1-2 months, as he has not been seen at the [facility] clinic.  He denies the use of alcohol since he was diagnosed with cirrhosis (approximately 6 months), but endorses cocaine use 2 days ago (7/2).  He endorses nausea, but no vomiting. He endorses chills, but denies fever.   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  He did not start Spironolactone following the admission.  He did not followup with a gastroenterologist as an outpatient.  He endorses chronic back pain with radiation to the left leg.  Meds: Current Facility-Administered Medications  Medication Dose Route Frequency Provider Last Rate Last Dose  . acetaminophen (TYLENOL) tablet 650 mg  650 mg Oral Q6H PRN Tasrif Ahmed, MD       Or  . acetaminophen (TYLENOL) suppository 650 mg  650 mg Rectal Q6H PRN Dellia Nims, MD      . Derrill Memo ON 06/28/9516] folic acid injection 1 mg  1 mg Intravenous Daily Tasrif Ahmed, MD      .  furosemide (LASIX) injection 40 mg  40 mg Intravenous Once Tasrif Ahmed, MD      . heparin injection 5,000 Units  5,000 Units Subcutaneous 3 times per day Tasrif Ahmed, MD      . lactulose (CHRONULAC) enema 200 gm  300 mL Rectal Once Tasrif Ahmed, MD      . LORazepam (ATIVAN) tablet 1 mg  1 mg Oral Q6H PRN Dellia Nims, MD       Or  . LORazepam (ATIVAN) injection 1 mg  1 mg Intravenous Q6H PRN Tasrif Ahmed, MD      . promethazine (PHENERGAN) tablet 12.5 mg  12.5 mg Oral Q6H PRN Tasrif Ahmed, MD      . Derrill Memo ON 11/01/2014] thiamine (B-1) injection 100 mg  100 mg Intravenous Daily Tasrif Ahmed, MD        Allergies: Allergies as of 10/31/2014 - Review Complete 10/31/2014  Allergen Reaction Noted  . Penicillins  12/16/2012   Past Medical History  Diagnosis Date  . Hypertension   . Chronic back pain   . Stroke 1998  . Polysubstance abuse   . Optic neuropathy, left   . CHF (congestive heart failure)   . Ascites   . ETOH abuse    Past Surgical History  Procedure Laterality Date  . None    .  Esophagogastroduodenoscopy N/A 08/14/2014    Procedure: ESOPHAGOGASTRODUODENOSCOPY (EGD);  Surgeon: Ladene Artist, MD;  Location: [facility];  Service: Endoscopy;  Laterality: N/A;  . Radiology with anesthesia N/A 08/15/2014    Procedure: RADIOLOGY WITH ANESTHESIA;  Surgeon: Greggory Keen, MD;  Location: [place];  Service: Radiology;  Laterality: N/A;   Family History  Problem Relation Age of Onset  . Hypertension Mother     Living  . Hypertension Brother   . Kidney disease Mother   . Kidney disease Brother   . Hypertension Father   . Hypertension Father     Deceased, 18  . Hypertension Sister   . Ulcers Father    History   Social History  . Marital Status: Single    Spouse Name: N/A  . Number of Children: N/A  . Years of Education: N/A   Occupational History  . Not on file.   Social History Main Topics  . Smoking status: Former Smoker -- 0.30 packs/day for 20 years    Types:  Cigarettes  . Smokeless tobacco: Current User    Types: Snuff     Comment: 2015  . Alcohol Use: 0.0 oz/week    0 Standard drinks or equivalent per week     Comment: occ  . Drug Use: Yes    Special: Cocaine, Marijuana     Comment: since fall 2015  . Sexual Activity: Not on file   Other Topics Concern  . Not on file   Social History Narrative   Lives with niece in a 2 story home.     On disability since 2015 for low back pain.  Used to work Statistician.    Highest level of education: 11th grade    Review of Systems: Pertinent items are noted in HPI.  Physical Exam: Blood pressure 157/87, pulse 101, temperature 98.3 F (36.8 C), temperature source Oral, resp. rate 22, height 5\' 10"  (1.778 m), weight 277 lb (125.646 kg), SpO2 98 %. Physical Exam  Constitutional: He is oriented to person, place, and time.  Agitated, lying in bed shaking  HENT:  Head: Normocephalic and atraumatic.  Eyes: EOM are normal. Scleral icterus is present.  Neck: No tracheal deviation present.  Cardiovascular: Normal rate, regular rhythm and normal heart sounds.   2+ DP pulses bilaterally.   Pulmonary/Chest:  Increased respiratory effort.  No respiratory distress. Minimal wheezes bilaterally.  No crackles.  Abdominal:  Large, tense, distended.  Nontender. Fluid wave present.  No rebound or guarding.  Musculoskeletal:  1+ pitting edema to knees bilaterally  Neurological: He is alert and oriented to person, place, and time.  No asterixis  Skin: Skin is warm and dry.  Sublingual jaundice appreciated.     Lab results: Basic Metabolic Panel:  Recent Labs  10/31/14 1856  NA 134*  K 4.7  CL 107  CO2 18*  GLUCOSE 105*  BUN 27*  CREATININE 3.25*  CALCIUM 8.3*   Liver Function Tests:  Recent Labs  10/31/14 1856  AST 71*  ALT 22  ALKPHOS 67  BILITOT 2.4*  PROT 8.7*  ALBUMIN 2.3*   No results for input(s): LIPASE, AMYLASE in the last 72 hours.  Recent Labs  10/31/14 1856    AMMONIA 48*   CBC:  Recent Labs  10/31/14 1856  WBC 8.5  NEUTROABS 5.5  HGB 8.1*  HCT 24.0*  MCV 78.2  PLT 120*   Cardiac Enzymes: No results for input(s): CKTOTAL, CKMB, CKMBINDEX, TROPONINI in the last 72 hours. BNP:  No results for input(s): PROBNP in the last 72 hours. D-Dimer: No results for input(s): DDIMER in the last 72 hours. CBG: No results for input(s): GLUCAP in the last 72 hours. Hemoglobin A1C: No results for input(s): HGBA1C in the last 72 hours. Fasting Lipid Panel: No results for input(s): CHOL, HDL, LDLCALC, TRIG, CHOLHDL, LDLDIRECT in the last 72 hours. Thyroid Function Tests: No results for input(s): TSH, T4TOTAL, FREET4, T3FREE, THYROIDAB in the last 72 hours. Anemia Panel: No results for input(s): VITAMINB12, FOLATE, FERRITIN, TIBC, IRON, RETICCTPCT in the last 72 hours. Coagulation: No results for input(s): LABPROT, INR in the last 72 hours. Urine Drug Screen: Drugs of Abuse     Component Value Date/Time   LABOPIA NONE DETECTED 10/31/2014 2127   COCAINSCRNUR POSITIVE* 10/31/2014 2127   LABBENZ NONE DETECTED 10/31/2014 2127   AMPHETMU NONE DETECTED 10/31/2014 2127   THCU NONE DETECTED 10/31/2014 2127   LABBARB NONE DETECTED 10/31/2014 2127    Alcohol Level:  Recent Labs  10/31/14 1858  ETH 6*   Urinalysis:  Recent Labs  10/31/14 2127  COLORURINE ORANGE*  LABSPEC 1.018  PHURINE 5.0  GLUCOSEU NEGATIVE  HGBUR NEGATIVE  BILIRUBINUR SMALL*  KETONESUR 15*  PROTEINUR 30*  UROBILINOGEN 1.0  NITRITE NEGATIVE  LEUKOCYTESUR SMALL*    Imaging results:  Dg Chest 2 View  10/31/2014   CLINICAL DATA:  Shortness of breath for 2 days.  EXAM: CHEST  2 VIEW  COMPARISON:  August 21, 2014  FINDINGS: The mediastinal contour is normal. The heart size is enlarged. There is mild diffuse increased interstitial markings in both lungs. There is no focal pneumonia or pleural effusion. The visualized skeletal structures are stable.  IMPRESSION: Mild  increased pulmonary interstitium bilaterally. Differential diagnosis includes interstitial edema or infectious/viral etiology.   Electronically Signed   By: Abelardo Diesel M.D.   On: 10/31/2014 20:28    Other results: EKG: prolonged QT interval, absent p-waves, possible atrial fibrillation.  Assessment & Plan by Problem: Active Problems:   Polysubstance abuse   Abnormal LFTs   Acute on chronic renal failure   Alcoholic cirrhosis of liver with ascites   Fluid overload   Hepatitis C   Ascites of liver  Mr. Trueba is a 59 yo male with HTN, alcoholic and HCV cirrhosis, polysubstance abuse, and CHF, presenting with 3 day history of worsening shortness of breath, consistent with decompensated cirrhosis.  1. Decompensated HCV/Alcoholic Cirrhosis: Patient presents with worsening SOB and grossly enlarged abdomen with fluid wave in the setting of medication noncompliance.  Given the size of his belly, the dyspnea is likely due to cephalization of the abdominal organs and compression of the diaphragm.  However, it is possible the dyspnea is 2/2 HFpEF exacerbation given diuretic noncompliance and evidence of interstitial edema on CXR.  This diagnosis is less likely than decompensated cirrhosis given his BNP of only 400 (>1000 at last admission).  Patient will receive diuretics to treat both possible causes.  Therapeutic paracentesis will also allow evaluation of SAAG and to rule out SBP.  Patient has been afebrile without abdominal tenderness, so  SBP unlikely at this time. Patient is currently alert and oriented, so there is little concern for hepatic encephalopathy despite the elevated ammonia.  Child-Pugh class C (10 pts).  MELD 26.  - Lasix 40 mg IV x2.  - Lactulose 200 g enema - Hold Spironolactone and PO Lactulose until patient able to take PO - Paracentesis by Interventional Radiology - F/u paracentesis cultures/cell count to r/o  SBP - PT/INR - Albuterol neb - CIWA - Phenergan 12.5 mg q6hours  PRN nausea - Folate/Thiamine  2. Acute on CKD IV: Likely pre-renal in the setting of volume overload.  Patient received lasix while in ED.  Will calculate FeUrea. - Urine Urea and Cr - Lasix as above - Hold spironolactone as above.  3. Abnormal ECG: Patient's ECG concerning for atrial fibrillation or multifocal atrial tachycardia.  No p-waves visible, but they are possibly masked by patients shaking chills during the exam.  Will repeat ECG in the morning to reevaluate and maintain patient on telemetry. - Repeat ECG in AM - Telemetry  4. HFpEF: EF 60-65% (10/4079) Grade 1 diastolic dysfunction.  CXR shows possible volume overload, but dyspnea likely 2/2 decompensated cirrhosis as above.  BNP 400 this admission (>1000 at last admission).  - Lasix as above  5. Polysubstance Abuse: Patient has history of alcohol abuse with current cocaine use.  Patient counseled to abstain from alcohol, cocaine, and other illicit substances.  6. Thrombocytopenia/Anemia: Likely 2/2 longstanding alcohol abuse, liver disease, and anemia of chronic disease.  Will CTM.  7. 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  Will CTM.  FEN/GI - NPO - No IVF  DVT Ppx: Heparin  Dispo: Disposition is deferred at this time, awaiting improvement of current medical problems.  The patient does not have a current PCP (No Pcp Per Patient) and does need an [facility] hospital follow-up appointment after discharge.  The patient does not have transportation limitations that hinder transportation to clinic appointments.  Signed: Iline Oven, MD, PhD 10/31/2014, 11:36 PM

## 2014-11-01 ENCOUNTER — Inpatient Hospital Stay (HOSPITAL_COMMUNITY): Payer: Medicaid Other

## 2014-11-01 ENCOUNTER — Encounter (HOSPITAL_COMMUNITY): Payer: Self-pay

## 2014-11-01 LAB — COMPREHENSIVE METABOLIC PANEL
ALBUMIN: 2.1 g/dL — AB (ref 3.5–5.0)
ALT: 20 U/L (ref 17–63)
ANION GAP: 10 (ref 5–15)
AST: 58 U/L — ABNORMAL HIGH (ref 15–41)
Alkaline Phosphatase: 63 U/L (ref 38–126)
BUN: 28 mg/dL — AB (ref 6–20)
CALCIUM: 8.3 mg/dL — AB (ref 8.9–10.3)
CO2: 19 mmol/L — ABNORMAL LOW (ref 22–32)
CREATININE: 3.34 mg/dL — AB (ref 0.61–1.24)
Chloride: 108 mmol/L (ref 101–111)
GFR calc non Af Amer: 19 mL/min — ABNORMAL LOW (ref >60)
GFR, EST AFRICAN AMERICAN: 22 mL/min — AB (ref >60)
GLUCOSE: 89 mg/dL (ref 65–99)
Potassium: 4.5 mmol/L (ref 3.5–5.1)
Sodium: 137 mmol/L (ref 135–145)
TOTAL PROTEIN: 8 g/dL (ref 6.5–8.1)
Total Bilirubin: 2.3 mg/dL — ABNORMAL HIGH (ref 0.3–1.2)

## 2014-11-01 LAB — CBC
HCT: 22.2 % — ABNORMAL LOW (ref 39.0–52.0)
HEMOGLOBIN: 7.6 g/dL — AB (ref 13.0–17.0)
MCH: 27.3 pg (ref 26.0–34.0)
MCHC: 34.2 g/dL (ref 30.0–36.0)
MCV: 79.9 fL (ref 78.0–100.0)
Platelets: 88 10*3/uL — ABNORMAL LOW (ref 150–400)
RBC: 2.78 MIL/uL — AB (ref 4.22–5.81)
RDW: 17.6 % — ABNORMAL HIGH (ref 11.5–15.5)
WBC: 8.6 10*3/uL (ref 4.0–10.5)

## 2014-11-01 LAB — GRAM STAIN

## 2014-11-01 LAB — PROTIME-INR
INR: 1.51 — ABNORMAL HIGH (ref 0.00–1.49)
INR: 1.6 — ABNORMAL HIGH (ref 0.00–1.49)
Prothrombin Time: 18.3 seconds — ABNORMAL HIGH (ref 11.6–15.2)
Prothrombin Time: 19 seconds — ABNORMAL HIGH (ref 11.6–15.2)

## 2014-11-01 LAB — APTT
aPTT: 33 seconds (ref 24–37)
aPTT: 33 seconds (ref 24–37)

## 2014-11-01 LAB — CREATININE, URINE, RANDOM: Creatinine, Urine: 56.1 mg/dL

## 2014-11-01 MED ORDER — FUROSEMIDE 40 MG PO TABS
60.0000 mg | ORAL_TABLET | Freq: Every day | ORAL | Status: DC
  Administered 2014-11-01: 60 mg via ORAL
  Filled 2014-11-01: qty 1

## 2014-11-01 MED ORDER — FUROSEMIDE 10 MG/ML IJ SOLN
40.0000 mg | Freq: Two times a day (BID) | INTRAMUSCULAR | Status: DC
  Administered 2014-11-01 – 2014-11-02 (×3): 40 mg via INTRAVENOUS
  Filled 2014-11-01 (×5): qty 4

## 2014-11-01 MED ORDER — SALINE SPRAY 0.65 % NA SOLN
1.0000 | NASAL | Status: DC | PRN
  Filled 2014-11-01: qty 44

## 2014-11-01 MED ORDER — SPIRONOLACTONE 100 MG PO TABS
100.0000 mg | ORAL_TABLET | Freq: Every day | ORAL | Status: DC
  Administered 2014-11-02: 100 mg via ORAL
  Filled 2014-11-01: qty 1

## 2014-11-01 MED ORDER — SPIRONOLACTONE 50 MG PO TABS
50.0000 mg | ORAL_TABLET | Freq: Every day | ORAL | Status: DC
  Administered 2014-11-01: 50 mg via ORAL
  Filled 2014-11-01: qty 1

## 2014-11-01 MED ORDER — FUROSEMIDE 10 MG/ML IJ SOLN: 40.0000 mg | Freq: Two times a day (BID) | INTRAMUSCULAR | Status: DC

## 2014-11-01 MED ORDER — LIDOCAINE HCL 1 % IJ SOLN
INTRAMUSCULAR | Status: AC
  Filled 2014-11-01: qty 20

## 2014-11-01 MED ORDER — LACTULOSE 10 GM/15ML PO SOLN
30.0000 g | Freq: Three times a day (TID) | ORAL | Status: DC
  Administered 2014-11-01 – 2014-11-02 (×5): 30 g via ORAL
  Filled 2014-11-01 (×9): qty 45

## 2014-11-01 NOTE — Progress Notes (Signed)
Pt with multiple bowel movements today due to Lactulose. Will continue to monitor.

## 2014-11-01 NOTE — Evaluation (Signed)
Clinical/Bedside Swallow Evaluation Patient Details  Name: Lawyer Washabaugh MRN: [id number] Date of Birth: April 19, 1956  Today's Date: 11/01/2014 Time: SLP Start Time (ACUTE ONLY): 4268 SLP Stop Time (ACUTE ONLY): 1345 SLP Time Calculation (min) (ACUTE ONLY): 10 min  Past Medical History:  Past Medical History  Diagnosis Date  . Hypertension   . Chronic back pain   . Stroke 1998  . Polysubstance abuse   . Optic neuropathy, left   . CHF (congestive heart failure)   . Ascites   . ETOH abuse    Past Surgical History:  Past Surgical History  Procedure Laterality Date  . None    . Esophagogastroduodenoscopy N/A 08/14/2014    Procedure: ESOPHAGOGASTRODUODENOSCOPY (EGD);  Surgeon: Ladene Artist, MD;  Location: [facility];  Service: Endoscopy;  Laterality: N/A;  . Radiology with anesthesia N/A 08/15/2014    Procedure: RADIOLOGY WITH ANESTHESIA;  Surgeon: Greggory Keen, MD;  Location: [place];  Service: Radiology;  Laterality: N/A;   HPI:  Mr. Prime is a 59 yo male with HTN, alcoholic and HCV cirrhosis, polysubstance abuse, and CHF, presenting with 3 day history of worsening shortness of breath, consistent with decompensated cirrhosis. Pt had FEES on 4/30 showing delayed swallow initiation but good airway protection despite occasional wet vocal quality. Dys 2/thin.    Assessment / Plan / Recommendation Clinical Impression  Pt demonstrates swallow function consistent with findings from last objective test including slight delay in swallow and subjectively wet vocal quality without other signs of aspiration. Pt is able to masticate soft solids (despite missing dentition) and follows solids with liquids. Recommend pt consume a dys 3 (mechanical soft) diet with thin liquids. Pt to eat meals in an upright position, tilted if needed. No SLP f/u needed.     Aspiration Risk  Mild    Diet Recommendation Dysphagia 3 (Mech soft);Thin   Medication Administration: Whole meds with liquid    Other   Recommendations Oral Care Recommendations: Oral care BID   Follow Up Recommendations       Frequency and Duration        Pertinent Vitals/Pain NA    SLP Swallow Goals     Swallow Study Prior Functional Status  Type of Home: Apartment Available Help at Discharge: Family;Available PRN/intermittently    General Other Pertinent Information: Mr. Basu is a 59 yo male with HTN, alcoholic and HCV cirrhosis, polysubstance abuse, and CHF, presenting with 3 day history of worsening shortness of breath, consistent with decompensated cirrhosis. Pt had FEES on 4/30 showing delayed swallow initiation but good airway protection despite occasional wet vocal quality. Dys 2/thin.  Type of Study: Bedside swallow evaluation Previous Swallow Assessment: see HPI Diet Prior to this Study: NPO Temperature Spikes Noted: No Respiratory Status: Supplemental O2 delivered via (comment) History of Recent Intubation: No Behavior/Cognition: Alert Oral Cavity - Dentition: Missing dentition Self-Feeding Abilities: Able to feed self Patient Positioning: Partially reclined Baseline Vocal Quality: Wet Volitional Cough: Strong Volitional Swallow: Able to elicit    Oral/Motor/Sensory Function Overall Oral Motor/Sensory Function: Appears within functional limits for tasks assessed   Ice Chips     Thin Liquid Thin Liquid: Impaired Presentation: Cup Pharyngeal  Phase Impairments: Suspected delayed Swallow;Wet Vocal Quality    Nectar Thick Nectar Thick Liquid: Not tested   Honey Thick Honey Thick Liquid: Not tested   Puree Puree: Within functional limits   Solid   GO    Solid: Within functional limits       Isiaih Hollenbach, Katherene Ponto 11/01/2014,2:17 PM

## 2014-11-01 NOTE — Progress Notes (Signed)
Utilization review completed. Tessla Spurling, RN, BSN. 

## 2014-11-01 NOTE — Progress Notes (Signed)
Pt bedside paracentesis dsg had some small stain, dsg changed; pt picked up and transported off to [place] for procedure. Francis Gaines Kylie Gros RN.

## 2014-11-01 NOTE — Progress Notes (Signed)
Pt came back from procedure in radiology at 1640   S/p paracentesis bandage at needle site is sligtly stained but intact no active bleeding noted  Pt denied any pain will continue to monitor.

## 2014-11-01 NOTE — Evaluation (Signed)
Occupational Therapy Evaluation Patient Details Name: John [name] MRN: [id number] DOB: 1955-06-02 Today's Date: 11/01/2014    History of Present Illness 59 y.o. male with a history of alcoholism, cirrhosis, coagulopathy, and polysubstance abuse, HTN, chronic back pain admitted with fluid overload and SOB   Clinical Impression   Pt with decline in function and safety with ADLs and ADL mobility with decreased strength, balance and endurance. Pt would benefit from acute OT services to address impairments to increase level of function and safety    Follow Up Recommendations  SNF;Supervision/Assistance - 24 hour    Equipment Recommendations  Tub/shower seat;Other (comment) (TBD at next venue of care)    Recommendations for Other Services       Precautions / Restrictions Precautions Precautions: Fall Restrictions Weight Bearing Restrictions: No      Mobility Bed Mobility               General bed mobility comments: pt up in recliner  Transfers Overall transfer level: Needs assistance Equipment used: Rolling walker (2 wheeled) Transfers: Sit to/from [facility] Sit to Stand: Min assist Stand pivot transfers: Min assist       General transfer comment: cues for correct hand placement     Balance   Sitting-balance support: No upper extremity supported;Feet supported Sitting balance-Leahy Scale: Good     Standing balance support: During functional activity Standing balance-Leahy Scale: Fair                              ADL Overall ADL's : Needs assistance/impaired     Grooming: Wash/dry hands;Wash/dry face;Standing;Min guard   Upper Body Bathing: Supervision/ safety;Set up;Sitting   Lower Body Bathing: Total assistance   Upper Body Dressing : Supervision/safety;Set up;Sitting   Lower Body Dressing: Total assistance   Toilet Transfer: Minimal assistance;BSC;RW;Cueing for safety Toilet Transfer Details (indicate cue type  and reason): cues for correct hand placement Toileting- Clothing Manipulation and Hygiene: Maximal assistance;Sit to/from stand       Functional mobility during ADLs: Minimal assistance;Cueing for safety       Vision  pt states that he does not have glasses but "probably needs some to see far away"   Perception Perception Perception Tested?: No   Praxis Praxis Praxis tested?: Not tested    Pertinent Vitals/Pain Pain Assessment: 0-10 Pain Score: 4  Pain Location: L foot Pain Descriptors / Indicators: Sore Pain Intervention(s): Limited activity within patient's tolerance;Monitored during session;Repositioned     Hand Dominance Right   Extremity/Trunk Assessment Upper Extremity Assessment Upper Extremity Assessment: Generalized weakness   Lower Extremity Assessment Lower Extremity Assessment: Defer to PT evaluation   Cervical / Trunk Assessment Cervical / Trunk Assessment: Normal   Communication Communication Communication: No difficulties   Cognition Arousal/Alertness: Awake/alert Behavior During Therapy: WFL for tasks assessed/performed Overall Cognitive Status: Within Functional Limits for tasks assessed                     General Comments   pt pleasant and cooperative                 Home Living Family/patient expects to be discharged to:: Private residence Living Arrangements: Alone Available Help at Discharge: Family;Available PRN/intermittently Type of Home: Apartment Home Access: Level entry;Elevator     Home Layout: One level     Bathroom Shower/Tub: Teacher, early years/pre: Handicapped height     Home Equipment: [place] -  single point          Prior Functioning/Environment Level of Independence: Independent        Comments: doesn't drive, [place] apartments    OT Diagnosis: Generalized weakness;Acute pain   OT Problem List: Decreased strength;Decreased knowledge of use of DME or AE;Decreased activity  tolerance;Pain;Impaired balance (sitting and/or standing)   OT Treatment/Interventions: Self-care/ADL training;Therapeutic exercise;Patient/family education;Therapeutic activities;DME and/or AE instruction    OT Goals(Current goals can be found in the care plan section) Acute Rehab OT Goals Patient Stated Goal: return home OT Goal Formulation: With patient Time For Goal Achievement: 11/08/14 Potential to Achieve Goals: Good ADL Goals Pt Will Perform Grooming: with set-up;with supervision;standing Pt Will Perform Lower Body Bathing: with max assist;with mod assist;with adaptive equipment;sit to/from stand Pt Will Perform Lower Body Dressing: with max assist;with mod assist;with adaptive equipment;sit to/from stand Pt Will Transfer to Toilet: with min guard assist;with supervision;ambulating;regular height toilet;grab bars Pt Will Perform Toileting - Clothing Manipulation and hygiene: with mod assist;sit to/from stand Pt Will Perform Tub/Shower Transfer: with min guard assist;with supervision;tub bench;grab bars;ambulating  OT Frequency: Min 2X/week   Barriers to D/C: Decreased caregiver support                        End of Session Equipment Utilized During Treatment: Gait belt;Other (comment);Rolling walker [facility])  Activity Tolerance: Patient tolerated treatment well Patient left: in chair;with chair alarm set;with call bell/phone within reach   Time: 3734-2876 OT Time Calculation (min): 24 min Charges:  OT General Charges $OT Visit: 1 Procedure OT Evaluation $Initial OT Evaluation Tier I: 1 Procedure OT Treatments $Therapeutic Activity: 8-22 mins G-Codes:    John [name] 11/01/2014, 1:57 PM

## 2014-11-01 NOTE — Progress Notes (Signed)
Subjective: Patient uncomfortable due to signifiant ascites, with obvious SOB.   Objective: Vital signs in last 24 hours: Filed Vitals:   11/01/14 0137 11/01/14 0623 11/01/14 0941 11/01/14 1500  BP: 156/83  158/97 142/92  Pulse: 103 97 94   Temp: 99.4 F (37.4 C) 98.6 F (37 C)    TempSrc: Oral Oral    Resp: 20 18    Height:      Weight:  276 lb 6.4 oz (125.374 kg)    SpO2: 98% 98%     Weight change:   Intake/Output Summary (Last 24 hours) at 11/01/14 1743 Last data filed at 11/01/14 1742  Gross per 24 hour  Intake    240 ml  Output    625 ml  Net   -385 ml   General appearance: alert, cooperative, appears stated age and mild to mod distress Lungs: clear to auscultation bilaterally- anterior ausculation only, as pt could not get up due to pain. Heart: regular rate and rhythm, S1, S2 normal, no murmur, click, rub or gallop Abdomen: tense, marked distension, tender likely from tense ascites; Extremities: extremities normal, atraumatic, no cyanosis or edema  Lab Results: Basic Metabolic Panel:  Recent Labs Lab 10/31/14 1856 11/01/14 0522  NA 134* 137  K 4.7 4.5  CL 107 108  CO2 18* 19*  GLUCOSE 105* 89  BUN 27* 28*  CREATININE 3.25* 3.34*  CALCIUM 8.3* 8.3*   Liver Function Tests:  Recent Labs Lab 10/31/14 1856 11/01/14 0522  AST 71* 58*  ALT 22 20  ALKPHOS 67 63  BILITOT 2.4* 2.3*  PROT 8.7* 8.0  ALBUMIN 2.3* 2.1*    Recent Labs Lab 10/31/14 1856  AMMONIA 48*   CBC:  Recent Labs Lab 10/31/14 1856 11/01/14 0522  WBC 8.5 8.6  NEUTROABS 5.5  --   HGB 8.1* 7.6*  HCT 24.0* 22.2*  MCV 78.2 79.9  PLT 120* 88*   Coagulation:  Recent Labs Lab 10/31/14 2338 11/01/14 0522  LABPROT 18.3* 19.0*  INR 1.51* 1.60*   Urine Drug Screen: Drugs of Abuse     Component Value Date/Time   LABOPIA NONE DETECTED 10/31/2014 2127   COCAINSCRNUR POSITIVE* 10/31/2014 2127   LABBENZ NONE DETECTED 10/31/2014 2127   AMPHETMU NONE DETECTED 10/31/2014  2127   THCU NONE DETECTED 10/31/2014 2127   LABBARB NONE DETECTED 10/31/2014 2127    Alcohol Level:  Recent Labs Lab 10/31/14 1858  ETH 6*   Urinalysis:  Recent Labs Lab 10/31/14 2127  COLORURINE ORANGE*  LABSPEC 1.018  PHURINE 5.0  GLUCOSEU NEGATIVE  HGBUR NEGATIVE  BILIRUBINUR SMALL*  KETONESUR 15*  PROTEINUR 30*  UROBILINOGEN 1.0  NITRITE NEGATIVE  LEUKOCYTESUR SMALL*   Micro Results: No results found for this or any previous visit (from the past 240 hour(s)). Studies/Results: Dg Chest 2 View  10/31/2014   CLINICAL DATA:  Shortness of breath for 2 days.  EXAM: CHEST  2 VIEW  COMPARISON:  August 21, 2014  FINDINGS: The mediastinal contour is normal. The heart size is enlarged. There is mild diffuse increased interstitial markings in both lungs. There is no focal pneumonia or pleural effusion. The visualized skeletal structures are stable.  IMPRESSION: Mild increased pulmonary interstitium bilaterally. Differential diagnosis includes interstitial edema or infectious/viral etiology.   Electronically Signed   By: Abelardo Diesel M.D.   On: 10/31/2014 20:28   Ir Paracentesis  11/01/2014   INDICATION: Symptomatic ascites.  EXAM: ULTRASOUND-GUIDED PARACENTESIS  COMPARISON:  Previous para  MEDICATIONS: 10 cc  1% lidocaine  COMPLICATIONS: None immediate  TECHNIQUE: Informed written consent was obtained from the patient after a discussion of the risks, benefits and alternatives to treatment. A timeout was performed prior to the initiation of the procedure.  Initial ultrasound scanning demonstrates a large amount of ascites within the left lower abdominal quadrant. The left lower abdomen was prepped and draped in the usual sterile fashion. 1% lidocaine with epinephrine was used for local anesthesia. Under direct ultrasound guidance, a 19 gauge, 7-cm, Yueh catheter was introduced. An ultrasound image was saved for documentation purposed.The paracentesis was performed. The catheter was removed and  a dressing was applied. The patient tolerated the procedure well without immediate post procedural complication.  FINDINGS: A total of approximately 5.3 liters of yellow fluid was removed. Samples were sent to the laboratory as requested by the clinical team.  IMPRESSION: Successful ultrasound-guided paracentesis yielding 5.3 liters of peritoneal fluid.  Read by:  Lavonia Drafts [facility]   Electronically Signed   By: Sandi Mariscal M.D.   On: 11/01/2014 15:41   Medications: I have reviewed the patient's current medications. Scheduled Meds: . folic acid  1 mg Intravenous Daily  . furosemide  60 mg Oral Daily  . lidocaine      . spironolactone  50 mg Oral Daily  . thiamine  100 mg Intravenous Daily   Continuous Infusions:  PRN Meds:.acetaminophen **OR** acetaminophen, LORazepam **OR** LORazepam, promethazine Assessment/Plan: Active Problems:   Polysubstance abuse   Abnormal LFTs   Acute on chronic renal failure   Alcoholic cirrhosis of liver with ascites   Fluid overload   Hepatitis C   Ascites of liver  Decompensated HCV/Alcoholic Cirrhosis: Likely due to noncomplaince. SOB likely due to diaphragmatic splinting due to massive ascites. Gotten two doses of IV lasix 40mg  with minimal urine output, also with likley hepato-renal syndrome. MELD 26. Bedside paracentesis unsuccesful.  - Cont Lasix 40 mg IV  BID - Cont spironolactone- 100mg  daily - Will resume home lactulose - F/u US paracentesis cultures/cell count to r/o SBP - CIWA - Phenergan 12.5 mg q6hours PRN nausea - Folate/Thiamine - BMEt am - CBC am - Resume home Lactulose. - Evaluate breathing am after paracentesis - Watch BP - Will avoid hepatotoxics - like tylenol  2. Acute on CKD IV: Likely pre-renal in the setting of volume overload. Patient received lasix while in ED. Will calculate FeUrea. - Urine Urea and Cr- pending - Lasix as above - Spironolactone as above. - Check Cr am  3. Abnormal ECG: Initial concern for atria  fib. Repeat EKG this am, with clear P waves, some irregularity due to PAC. - Telemetry, D.c when stable.  4. HFpEF: EF 60-65% (07/4313) Grade 1 diastolic dysfunction. CXR shows possible volume overload, but dyspnea likely 2/2 decompensated cirrhosis as above. BNP 400 this admission (>1000 at last admission).  - Lasix as above  5. Polysubstance Abuse: Patient has history of alcohol abuse with current cocaine use. Patient counseled to abstain from alcohol, cocaine, and other illicit substances. - Social work consult. - CIWA  6. Thrombocytopenia/Anemia: Likely 2/2 longstanding alcohol abuse, liver disease, and anemia of chronic disease. - SCDs - Hold anticoag for now, INR elevated.  7. 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  FEN/GI -On dysphagia 3 diet.  DVT Ppx: Scds.  Dispo: Disposition is deferred at this time, awaiting improvement of current medical problems.  Anticipated discharge in approximately 1-2 day(s).   The patient does not know have a current PCP (No Pcp Per  Patient) and does need an [facility] hospital follow-up appointment after discharge.  The patient does not know have transportation limitations that hinder transportation to clinic appointments.  .Services Needed at time of discharge: Y = Yes, Blank = No PT:   OT:   RN:   Equipment:   Other:     LOS: 1 day   Bethena Roys, MD 11/01/2014, 5:43 PM

## 2014-11-01 NOTE — Progress Notes (Signed)
Subjective: Patient reports continued shortness of breath this morning and is clearly uncomfortable. He endorses some nausea and chills. He denies fever.  Objective: Vital signs in last 24 hours: Filed Vitals:   10/31/14 2236 11/01/14 0137 11/01/14 0623 11/01/14 0941  BP: 157/87 156/83    Pulse: 101 103 97 94  Temp: 98.3 F (36.8 C) 99.4 F (37.4 C) 98.6 F (37 C)   TempSrc: Oral Oral Oral   Resp: 22 20 18    Height: 5\' 10"  (1.778 m)     Weight: 125.646 kg (277 lb)  125.374 kg (276 lb 6.4 oz)   SpO2: 98% 98% 98%    Weight change:   Intake/Output Summary (Last 24 hours) at 11/01/14 1253 Last data filed at 11/01/14 0846  Gross per 24 hour  Intake    240 ml  Output    400 ml  Net   -160 ml   General Appearance: Well-developed, well-nourished, man lying in bed in moderate distress Head:  Normocephalic,  atraumatic Eyes:  Scleral icterus present Lungs:  Increased respiratory effort; no distress; patient declined auscultation Heart:  Regular rate and rhythm, S1 and S2 normal, no murmur, rub or gallop Abdomen:   Distended, tense, nontender; BS+; dullness to percussion in the gutters; no appreciable fluid wave; no rebound tenderness or guarding Genitalia:  Mild scrotal swelling Extremities:  Significant bilateral edema of the lower extremities and abdomen Pulses:  2+ PT and DP pulses bilaterally Skin:  Skin is warm and dry Neurologic: No asterixis  Labs: Basic Metabolic Panel:  Recent Labs  10/31/14 1856 11/01/14 0522  NA 134* 137  K 4.7 4.5  CL 107 108  CO2 18* 19*  GLUCOSE 105* 89  BUN 27* 28*  CREATININE 3.25* 3.34*  CALCIUM 8.3* 8.3*   Liver Function Tests:  Recent Labs  10/31/14 1856 11/01/14 0522  AST 71* 58*  ALT 22 20  ALKPHOS 67 63  BILITOT 2.4* 2.3*  PROT 8.7* 8.0  ALBUMIN 2.3* 2.1*    Recent Labs  10/31/14 1856  AMMONIA 48*   CBC:  Recent Labs  10/31/14 1856 11/01/14 0522  WBC 8.5 8.6  NEUTROABS 5.5  --   HGB 8.1* 7.6*  HCT 24.0*  22.2*  MCV 78.2 79.9  PLT 120* 88*   Coagulation:  Recent Labs  10/31/14 2338 11/01/14 0522  LABPROT 18.3* 19.0*  INR 1.51* 1.60*   Urine Drug Screen: Drugs of Abuse     Component Value Date/Time   LABOPIA NONE DETECTED 10/31/2014 2127   COCAINSCRNUR POSITIVE* 10/31/2014 2127   LABBENZ NONE DETECTED 10/31/2014 2127   AMPHETMU NONE DETECTED 10/31/2014 2127   THCU NONE DETECTED 10/31/2014 2127   LABBARB NONE DETECTED 10/31/2014 2127    Alcohol Level:  Recent Labs  10/31/14 1858  ETH 6*   Urinalysis:  Recent Labs  10/31/14 2127  COLORURINE ORANGE*  LABSPEC 1.018  PHURINE 5.0  GLUCOSEU NEGATIVE  HGBUR NEGATIVE  BILIRUBINUR SMALL*  KETONESUR 15*  PROTEINUR 30*  UROBILINOGEN 1.0  NITRITE NEGATIVE  LEUKOCYTESUR SMALL*   Other labs: BNP 398.9  Other results: EKG (11/01/14): -Normal sinus rhythm at 95 bpm -LVH by voltage in aVL -Normal intervals -No significant Q waves, no ST segment changes -No significant change from previous ECG on 08/14/14  Imaging: Dg Chest 2 View  10/31/2014   CLINICAL DATA:  Shortness of breath for 2 days.  EXAM: CHEST  2 VIEW  COMPARISON:  August 21, 2014  FINDINGS: The mediastinal contour is normal. The  heart size is enlarged. There is mild diffuse increased interstitial markings in both lungs. There is no focal pneumonia or pleural effusion. The visualized skeletal structures are stable.  IMPRESSION: Mild increased pulmonary interstitium bilaterally. Differential diagnosis includes interstitial edema or infectious/viral etiology.   Electronically Signed   By: Abelardo Diesel M.D.   On: 10/31/2014 20:28    Medications:  Scheduled Medications: . folic acid  1 mg Intravenous Daily  . furosemide  40 mg Intravenous BID  . thiamine  100 mg Intravenous Daily   PRN Medications: acetaminophen **OR** acetaminophen, LORazepam **OR** LORazepam, promethazine    Assessment/Plan: Active Problems:   Polysubstance abuse   Abnormal LFTs   Acute  on chronic renal failure   Alcoholic cirrhosis of liver with ascites   Fluid overload   Hepatitis C   Ascites of liver  Mr. Demarest is a 59 yo male with a history of alcoholic and HCV cirrhosis complicated by portal hypertension and ascites, polysubstance abuse (EtOH and cocaine), CHF, and hypertension who presented to [facility] on 10/31/14 with a 3 day history of worsening shortness of breath, consistent with decompensated cirrhosis after not taking any of his diuretics or other medications for the past month.  1. Total body volume overload secondary to decompensated HCV/Alcoholic Cirrhosis: Patient presents with worsening SOB and distended abdomen in the setting of medication noncompliance. He has failed to take any of his diuretics or other medications for the past month and has failed to have follow up with his PCP or gastroenterologist.  Dyspnea may be secondary excursion of diaphragms due to significant ascites.There may also be component of HPpEF exacerbation given his diuretic noncompliance and evidence of interstitial edema on CXR; however, this is less likely given his BNP of only 400 (>1000 at last admission).We will restart Lasix at 50mg  IV BID and sprionolactone 100mg  PO QD. Additionally, we an LVP will be performed by IR (non-US attempted this pm unsuccessfully) and fluid will be sent for evaluation of SAAG and to rule out SBP (cell count and culture).Currently, patient is afebrile without abdominal tenderness, so SBP is not likely.  There is little concern for that patient is encephalopathic given no signs of asterixis and that he is alert and oriented, despite his elevated ammonia (venous ammonia). Patient is Child-Pugh class C (10 pts) with MELD score 26. While an inpatient, importance of medication compliance and follow-up with PCP and gastroenterologist will be stressed. - Resume home lactulose 30g PO TID - Continue Lasix 40mg  IV BID - Continue spironlactone 100mg  daily - Paracentesis  performed by Interventional Radiology (11/01/14 pm) [ ]  F/u paracentesis cultures/cell count to r/o SBP; deterimine SAAG [ ]  F/u BMEt am, CBC am - Avoid hepatotoxics (i.e. acetominophen) - Continue CIWA - Continue Phenergan 12.5 mg q6hours PRN nausea - Continue Folate/Thiamine  2. Acute on CKD IV: Likely pre-renal in the setting of volume overload. Patient received lasix while in ED. Will calculate FeUrea. [ ]  Urine Urea and Cr - Lasix and spironolactone as above - Trend creatinine  3. Abnormal ECG: Concern for absence of P waves/possible A fib on initial ECG. Repeat ECG showed P waves with some irregularity due to PAC. [ ]  check tele in am; discontinue if stable  4. HFpEF: EF 60-65% (04/8297) Grade 1 diastolic dysfunction. CXR shows possible volume overload, but dyspnea likely 2/2 decompensated cirrhosis as above. BNP 400 this admission (>1000 at last admission).  - Continue Lasix as above  5. Polysubstance Abuse: Patient has history of alcohol abuse  with current cocaine use. Patient counseled to abstain from alcohol, cocaine, and other illicit substances. - Social work consult - Continue CIWA  6. Thrombocytopenia/Anemia: Likely 2/2 longstanding alcohol abuse, liver disease, and anemia of chronic disease.  - INR elevated; will hold anticoag for now; SCDs  7. 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 Will CTM.  FEN/GI/PPx -Diet = Dysphagia Mechanically Advanced Diet (Level 3); thin liquids -Fluids = None -Electrolytes = Replete per protocol -DVT = SCDs  Code Status -Full code   Disposition -Disposition is deferred at this time, awaiting improvement of fluid overload.  OT recommends discharge to SNF; 24 hour supervision/assistance -Anticipated discharge in approximately 4-7 day(s).  -The patient does not have a current PCP (No PCP Per Patient) and does need an [facility] hospital follow-up appointment after discharge.   -The patient does not have  transportation limitations that hinder transportation to clinic appointments.  Length of Stay: 1 day(s)  This is a Careers information officer Note.  The care of the patient was discussed with Dr. Denton Brick and the assessment and plan formulated with their assistance.  Please see their attached note for official documentation of the daily encounter.   LOS: 1 day   Quita Skye, Med Student 11/01/2014, 12:53 PM

## 2014-11-01 NOTE — Evaluation (Signed)
Physical Therapy Evaluation Patient Details Name: John [name] MRN: [id number] DOB: [birth year] Today's Date: 11/01/2014   History of Present Illness  59 y.o. male with a history of alcoholism, cirrhosis, coagulopathy, and polysubstance abuse, HTN, chronic back pain admitted with fluid overload and SOB  Clinical Impression  Pt reporting fatigue and difficulty with mobility despite bil LE strength 5/5 but limited hip ROM due to edema per pt. Pt with cues and education for mobility with assist to complete transfers and mobility. Pt will benefit from acute therapy to maximize mobility, gait and function to decrease burden of care.     Follow Up Recommendations SNF;Supervision for mobility/OOB    Equipment Recommendations  Rolling walker with 5" wheels    Recommendations for Other Services       Precautions / Restrictions Precautions Precautions: Fall      Mobility  Bed Mobility Overal bed mobility: Needs Assistance Bed Mobility: Rolling;Sidelying to Sit Rolling: Mod assist Sidelying to sit: Min assist       General bed mobility comments: pt initially stating too fatigued to mobilize but able to transfer to EOB with mod assist with cues for sequence and assist to rotate pelvis and elevate trunk  Transfers Overall transfer level: Needs assistance   Transfers: Sit to/from Stand;Stand Pivot Transfers Sit to Stand: Min assist Stand pivot transfers: Min assist       General transfer comment: cues for hand placement and sequence x 1 from bed , x  from [facility] with assist for pericare  Ambulation/Gait Ambulation/Gait assistance: Min guard Ambulation Distance (Feet): 30 Feet Assistive device: Rolling walker (2 wheeled) Gait Pattern/deviations: Step-through pattern;Decreased stride length   Gait velocity interpretation: Below normal speed for age/gender General Gait Details: cues for posture and position in rW, distance limited by fatigue  Stairs            Wheelchair  Mobility    Modified Rankin (Stroke Patients Only)       Balance Overall balance assessment: Needs assistance   Sitting balance-Leahy Scale: Good       Standing balance-Leahy Scale: Fair                               Pertinent Vitals/Pain Pain Assessment: No/denies pain  HR 94    Home Living Family/patient expects to be discharged to:: Private residence Living Arrangements: Alone Available Help at Discharge: Family;Available PRN/intermittently Type of Home: Apartment Home Access: Level entry;Elevator     Home Layout: One level Home Equipment: Cane - single point      Prior Function Level of Independence: Independent         Comments: doesn't drive, [place] apartments     Hand Dominance        Extremity/Trunk Assessment   Upper Extremity Assessment: Generalized weakness           Lower Extremity Assessment: Overall WFL for tasks assessed      Cervical / Trunk Assessment: Normal  Communication   Communication: No difficulties  Cognition Arousal/Alertness: Awake/alert Behavior During Therapy: WFL for tasks assessed/performed Overall Cognitive Status: Within Functional Limits for tasks assessed                      General Comments      Exercises General Exercises - Lower Extremity Long Arc Quad: AROM;Seated;Both;10 reps Hip Flexion/Marching: AROM;Seated;Both;10 reps      Assessment/Plan    PT Assessment Patient needs continued PT  services  PT Diagnosis Difficulty walking   PT Problem List Decreased activity tolerance;Decreased balance;Decreased mobility;Decreased knowledge of use of DME  PT Treatment Interventions Gait training;DME instruction;Functional mobility training;Therapeutic activities;Therapeutic exercise;Patient/family education   PT Goals (Current goals can be found in the Care Plan section) Acute Rehab PT Goals Patient Stated Goal: return home PT Goal Formulation: With patient Time For Goal  Achievement: 11/15/14 Potential to Achieve Goals: Good    Frequency Min 3X/week   Barriers to discharge Decreased caregiver support      Co-evaluation               End of Session Equipment Utilized During Treatment: Gait belt;Oxygen Activity Tolerance: Patient tolerated treatment well Patient left: in chair;with call bell/phone within [name];with chair alarm set;with nursing/sitter in room Nurse Communication: Mobility status         Time: 6286-3817 PT Time Calculation (min) (ACUTE ONLY): 20 min   Charges:   PT Evaluation $Initial PT Evaluation Tier I: 1 Procedure     PT G CodesMelford [name] 11/01/2014, 9:45 AM John [name], [place]

## 2014-11-01 NOTE — Procedures (Signed)
  PROCEDURE:  Diagnostic & Therapeutic Paracentesis, U/S guided.  INDICATION:  Worsening ascites.  PROCEDURE OPERATOR: Bethena Roys MD, PGY-2, IMTS.  CONSENT:  Informed consent was obtained after risks and benefits were explained at length.   PROCEDURE SUMMARY:  The area of the right side of the abdomen was prepped and draped in a sterile fashion using chlorhexidine scrub. 1% lidocaine was used to numb the region. Site of entry was about 3cm above the anterior superior iliac crest and The skin was incised 1.5 mm using a 10 blade scalpel. The paracentesis catheter was inserted and advanced. No blood was aspirated. Despite 2 attempts, procedure was not successful. Some bleeding present after procedure, so pressure was applied for 10-29mins, and then hemostasis was secured.  Dr. Eppie Gibson was present during the procedure. Pt was sent for ultrasound guided paracentesis.  ESTIMATED BLOOD LOSS: ~68mls total.

## 2014-11-02 DIAGNOSIS — I5032 Chronic diastolic (congestive) heart failure: Secondary | ICD-10-CM

## 2014-11-02 DIAGNOSIS — K7031 Alcoholic cirrhosis of liver with ascites: Principal | ICD-10-CM

## 2014-11-02 DIAGNOSIS — R0981 Nasal congestion: Secondary | ICD-10-CM

## 2014-11-02 DIAGNOSIS — Z87898 Personal history of other specified conditions: Secondary | ICD-10-CM

## 2014-11-02 DIAGNOSIS — D631 Anemia in chronic kidney disease: Secondary | ICD-10-CM

## 2014-11-02 DIAGNOSIS — N184 Chronic kidney disease, stage 4 (severe): Secondary | ICD-10-CM

## 2014-11-02 DIAGNOSIS — F1410 Cocaine abuse, uncomplicated: Secondary | ICD-10-CM

## 2014-11-02 DIAGNOSIS — Z9119 Patient's noncompliance with other medical treatment and regimen: Secondary | ICD-10-CM

## 2014-11-02 DIAGNOSIS — N179 Acute kidney failure, unspecified: Secondary | ICD-10-CM

## 2014-11-02 DIAGNOSIS — I129 Hypertensive chronic kidney disease with stage 1 through stage 4 chronic kidney disease, or unspecified chronic kidney disease: Secondary | ICD-10-CM

## 2014-11-02 DIAGNOSIS — F1020 Alcohol dependence, uncomplicated: Secondary | ICD-10-CM

## 2014-11-02 DIAGNOSIS — D696 Thrombocytopenia, unspecified: Secondary | ICD-10-CM

## 2014-11-02 DIAGNOSIS — K7040 Alcoholic hepatic failure without coma: Secondary | ICD-10-CM

## 2014-11-02 LAB — PATHOLOGIST SMEAR REVIEW

## 2014-11-02 LAB — BASIC METABOLIC PANEL
Anion gap: 8 (ref 5–15)
BUN: 29 mg/dL — AB (ref 6–20)
CO2: 20 mmol/L — ABNORMAL LOW (ref 22–32)
Calcium: 8.1 mg/dL — ABNORMAL LOW (ref 8.9–10.3)
Chloride: 112 mmol/L — ABNORMAL HIGH (ref 101–111)
Creatinine, Ser: 3.06 mg/dL — ABNORMAL HIGH (ref 0.61–1.24)
GFR calc Af Amer: 24 mL/min — ABNORMAL LOW (ref >60)
GFR, EST NON AFRICAN AMERICAN: 21 mL/min — AB (ref >60)
GLUCOSE: 96 mg/dL (ref 65–99)
POTASSIUM: 4.2 mmol/L (ref 3.5–5.1)
Sodium: 140 mmol/L (ref 135–145)

## 2014-11-02 LAB — BODY FLUID CELL COUNT WITH DIFFERENTIAL
EOS FL: 0 %
Lymphs, Fluid: 22 %
Monocyte-Macrophage-Serous Fluid: 66 % (ref 50–90)
Neutrophil Count, Fluid: 12 % (ref 0–25)
Total Nucleated Cell Count, Fluid: 124 cu mm (ref 0–1000)

## 2014-11-02 LAB — CBC
HCT: 22.1 % — ABNORMAL LOW (ref 39.0–52.0)
HEMOGLOBIN: 7.4 g/dL — AB (ref 13.0–17.0)
MCH: 26.7 pg (ref 26.0–34.0)
MCHC: 33.5 g/dL (ref 30.0–36.0)
MCV: 79.8 fL (ref 78.0–100.0)
PLATELETS: 61 10*3/uL — AB (ref 150–400)
RBC: 2.77 MIL/uL — AB (ref 4.22–5.81)
RDW: 17.3 % — ABNORMAL HIGH (ref 11.5–15.5)
WBC: 8 10*3/uL (ref 4.0–10.5)

## 2014-11-02 MED ORDER — SPIRONOLACTONE 100 MG PO TABS
200.0000 mg | ORAL_TABLET | Freq: Every day | ORAL | Status: DC
  Filled 2014-11-02: qty 2

## 2014-11-02 MED ORDER — CEPASTAT 14.5 MG MT LOZG
1.0000 | LOZENGE | OROMUCOSAL | Status: DC | PRN
  Filled 2014-11-02: qty 9

## 2014-11-02 MED ORDER — ALBUTEROL SULFATE (2.5 MG/3ML) 0.083% IN NEBU
2.5000 mg | INHALATION_SOLUTION | RESPIRATORY_TRACT | Status: DC | PRN
  Administered 2014-11-02: 2.5 mg via RESPIRATORY_TRACT
  Filled 2014-11-02: qty 3

## 2014-11-02 MED ORDER — SALINE SPRAY 0.65 % NA SOLN
1.0000 | NASAL | Status: DC | PRN
  Administered 2014-11-02: 1 via NASAL
  Filled 2014-11-02: qty 44

## 2014-11-02 MED ORDER — SPIRONOLACTONE 100 MG PO TABS
100.0000 mg | ORAL_TABLET | ORAL | Status: AC
  Administered 2014-11-02: 100 mg via ORAL
  Filled 2014-11-02: qty 1

## 2014-11-02 NOTE — Progress Notes (Signed)
Subjective: Patient complains of a 1 week history of congestion. Otherwise, breathing is improved from yesterday. Patient no longer SOB and is more comfortable compared to yesterday.  Objective: Vital signs in last 24 hours: Filed Vitals:   11/02/14 0126 11/02/14 0159 11/02/14 0554 11/02/14 1011  BP: 160/88 148/82 144/90 146/88  Pulse: 95 92 90 83  Temp:  98 F (36.7 C) 98.2 F (36.8 C)   TempSrc:  Oral Oral   Resp: 22 20 18    Height:      Weight:   125.265 kg (276 lb 2.6 oz)   SpO2: 97% 100% 98% 98%   Weight change: 20.938 kg (46 lb 2.6 oz)  Intake/Output Summary (Last 24 hours) at 11/02/14 1103 Last data filed at 11/02/14 1013  Gross per 24 hour  Intake   1375 ml  Output   1380 ml  Net     -5 ml    General Appearance: Well-developed, well-nourished, conversant and in NAD Head: Normocephalic,atraumatic Mouth: Dry MM Eyes: Scleral icterus present Lungs: No increased work of breathing on RA; CTAB; no distress Heart: Regular rate and rhythm, S1 and S2 normal, no murmur, rub or gallop Abdomen: Distended, tense, nontender; BS+; significant pitting edema up to the diaphragm bilaterally Extremities: Significant bilateral bilateral pitting edema of the lower extremities and abdomen Pulses: 2+ PT and DP pulses bilaterally Skin: Skin is warm and dry Neurologic: No asterixis  Labs: Basic Metabolic Panel:  Recent Labs Lab 11/01/14 0522 11/02/14 0329  NA 137 140  K 4.5 4.2  CL 108 112*  CO2 19* 20*  GLUCOSE 89 96  BUN 28* 29*  CREATININE 3.34* 3.06*  CALCIUM 8.3* 8.1*   Liver Function Tests:  Recent Labs Lab 10/31/14 1856 11/01/14 0522  AST 71* 58*  ALT 22 20  ALKPHOS 67 63  BILITOT 2.4* 2.3*  PROT 8.7* 8.0  ALBUMIN 2.3* 2.1*   No results for input(s): LIPASE, AMYLASE in the last 168 hours.  Recent Labs Lab 10/31/14 1856  AMMONIA 48*   CBC:  Recent Labs Lab 10/31/14 1856 11/01/14 0522 11/02/14 0329  WBC 8.5 8.6 8.0  NEUTROABS 5.5  --    --   HGB 8.1* 7.6* 7.4*  HCT 24.0* 22.2* 22.1*  MCV 78.2 79.9 79.8  PLT 120* 88* 61*   Coagulation:  Recent Labs Lab 10/31/14 2338 11/01/14 0522  LABPROT 18.3* 19.0*  INR 1.51* 1.60*   Urine Drug Screen: Drugs of Abuse     Component Value Date/Time   LABOPIA NONE DETECTED 10/31/2014 2127   COCAINSCRNUR POSITIVE* 10/31/2014 2127   LABBENZ NONE DETECTED 10/31/2014 2127   AMPHETMU NONE DETECTED 10/31/2014 2127   THCU NONE DETECTED 10/31/2014 2127   LABBARB NONE DETECTED 10/31/2014 2127    Alcohol Level:  Recent Labs Lab 10/31/14 1858  ETH 6*   Urinalysis:  Recent Labs Lab 10/31/14 2127  COLORURINE ORANGE*  LABSPEC 1.018  PHURINE 5.0  GLUCOSEU NEGATIVE  HGBUR NEGATIVE  BILIRUBINUR SMALL*  KETONESUR 15*  PROTEINUR 30*  UROBILINOGEN 1.0  NITRITE NEGATIVE  LEUKOCYTESUR SMALL*   Other Labs:  Ascitic fluid (11/01/14): Gram stain: abundant WBC present; both PMN and mononuclear organisms seen Cell count with differential: WBC = 124, 12% neutophil, 22% lymphs, 66% monocytes-macrophage  Imaging: Dg Chest 2 View  10/31/2014   CLINICAL DATA:  Shortness of breath for 2 days.  EXAM: CHEST  2 VIEW  COMPARISON:  August 21, 2014  FINDINGS: The mediastinal contour is normal. The heart size is enlarged.  There is mild diffuse increased interstitial markings in both lungs. There is no focal pneumonia or pleural effusion. The visualized skeletal structures are stable.  IMPRESSION: Mild increased pulmonary interstitium bilaterally. Differential diagnosis includes interstitial edema or infectious/viral etiology.   Electronically Signed   By: Abelardo Diesel M.D.   On: 10/31/2014 20:28   Ir Paracentesis  11/01/2014   INDICATION: Symptomatic ascites.  EXAM: ULTRASOUND-GUIDED PARACENTESIS  COMPARISON:  Previous para  MEDICATIONS: 10 cc 1% lidocaine  COMPLICATIONS: None immediate  TECHNIQUE: Informed written consent was obtained from the patient after a discussion of the risks, benefits and  alternatives to treatment. A timeout was performed prior to the initiation of the procedure.  Initial ultrasound scanning demonstrates a large amount of ascites within the left lower abdominal quadrant. The left lower abdomen was prepped and draped in the usual sterile fashion. 1% lidocaine with epinephrine was used for local anesthesia. Under direct ultrasound guidance, a 19 gauge, 7-cm, Yueh catheter was introduced. An ultrasound image was saved for documentation purposed.The paracentesis was performed. The catheter was removed and a dressing was applied. The patient tolerated the procedure well without immediate post procedural complication.  FINDINGS: A total of approximately 5.3 liters of yellow fluid was removed. Samples were sent to the laboratory as requested by the clinical team.  IMPRESSION: Successful ultrasound-guided paracentesis yielding 5.3 liters of peritoneal fluid.  Read by:  Lavonia Drafts [facility]   Electronically Signed   By: Sandi Mariscal M.D.   On: 11/01/2014 15:41    Medications:  Scheduled Medications: . folic acid  1 mg Intravenous Daily  . furosemide  40 mg Intravenous BID  . lactulose  30 g Oral TID  . spironolactone  100 mg Oral NOW  . [START ON 11/03/2014] spironolactone  200 mg Oral Daily  . thiamine  100 mg Intravenous Daily   PRN Medications: albuterol, LORazepam **OR** LORazepam, promethazine, sodium chloride    Assessment/Plan: Active Problems:   Polysubstance abuse   Abnormal LFTs   Acute on chronic renal failure   Alcoholic cirrhosis of liver with ascites   Fluid overload   Hepatitis C   Ascites of liver   John [name] is a 59 yo male with a history of alcoholic and HCV cirrhosis complicated by portal hypertension and ascites, polysubstance abuse (EtOH and cocaine), CHF, and hypertension who presented to [facility] on 10/31/14 with a 3 day history of worsening shortness of breath, consistent with decompensated cirrhosis after not taking any of his diuretics or other  medications for the past month.  1. Total body volume overload secondary to decompensated HCV/Alcoholic Cirrhosis: After LVP yesterday (5.3L removed), patient's SOB is improved, but he remains distended and anasarcous. With his improved breathing, it is likely that his dyspnea was secondary to excursion of diaphragms due to ascites. It is unlikely that his HPpEF exacerbation is contributory given BNP of only 400 on admission (>1000 at last admission) and benign lung exam. Today, plan is to increase his spironolactone to 200mg  daily and maintain his current Lasix at 40mg  IV BID while closely monitoring his K, Cr, and anasarca/abdominal distension.  We will continue to emphasize the importance of medication compliance and follow-up with PCP and gastroenterologist.  Notably, ascitic fluid drawn yesterday demonstrated no organisms and PMN count of 14.8. We will continue to follow the cell culture, however, SBP very unlikely is absence of clinical signs of infection and low PMN count.  Finally, while patient has not been encephalopathic during this admission, he has  a history of hepatic encepalopathy during prior admissions. As such, he has a home prescription for lactulose, although he has not been taking for 1-2 months. Lactulose was restarted on 11/01/14 given his documented history of hepatic encephalopathy; however, need to continue as outpatient will be addressed closer to discharge. - Continue Lasix 40mg  IV BID - Increase spironlactone to 200mg  daily - Continue lactulose 30g TID - Consider repeat paracentesis on 11/04/14 (3 days after prior LVP) if patient is symptomatic; last LVP performed by Interventional Radiology on 11/01/14 pm [ ]  F/u ascitic cell cultures; cell count show 14 PMNs - Creatine improved from yesterday: 3.34 to 3.06; K stable; repeat BMP tomorrow am - Continue CIWA - Continue Phenergan 12.5 mg q6hours PRN nausea - Continue Folate/Thiamine  2. Nasal congestion.  Patient complains of 1  weeks history of nasal congestion. His Sp02 on RA was 98%, so his O2 was discontinued which may have been contributory.  - O2 discontinued - Start PRN saline nasal spray   3. Acute on CKD IV: Likely pre-renal in the setting of volume overload. Patient received lasix while in ED. Will calculate FeUrea. CTM. [ ]  Urine Urea and Cr - Lasix and spironolactone as above - Trend creatinine per above  4. Abnormal ECG: Resolved. On admission EKG there was initial concern for absence of P waves/possible A fib. Repeat showed no concerning findings and no change from previous ECG on 4/17/116 other than the LVH by voltage in aVL. No telemetry needed.  5. HFpEF: EF 60-65% (0/5397) Grade 1 diastolic dysfunction. CXR shows possible volume overload, but initial dyspnea likely 2/2 decompensated cirrhosis per above. BNP 400 this admission (>1000 at last admission).  - Continue Lasix as above  6. Polysubstance Abuse: Patient has history of alcohol abuse with current cocaine use. Patient counseled to abstain from alcohol, cocaine, and other illicit substances. - Social work consult - Continue CIWA  7. Thrombocytopenia/Anemia: Likely 2/2 longstanding alcohol abuse, liver disease, and anemia of chronic disease. CBC this morning demonstrated stable hemoglobin, but platelets down from 120 --> 61. Patient did receive 2x 5000U heparin [place] injections near admission. Platelet transfusion threshold = 10. Will draw repeat CBC tomorrow morning - INR elevated; continue to anticoagulation; continue SCDs - Trend platelets; morning CBC (11/03/14)  8. 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   FEN/GI/PPx -Diet = Dysphagia Mechanically Advanced Diet (Level 3); thin liquids -Fluids = None -Electrolytes = Replete per protocol -DVT = SCDs  Code Status -Full code   Disposition -Disposition is deferred at this time, awaiting improvement of fluid overload. OT recommends discharge to SNF; 24 hour  supervision/assistance -Anticipated discharge in approximately 4-7 day(s).  -The patient does not have a current PCP (No PCP Per Patient) and does need an [facility] hospital follow-up appointment after discharge.  -The patient does not have transportation limitations that hinder transportation to clinic appointments.  Length of Stay: 2 day(s)  This is a Careers information officer Note.  The care of the patient was discussed with Dr. Denton Brick and the assessment and plan formulated with their assistance.  Please see their attached note for official documentation of the daily encounter.   LOS: 2 days   Quita Skye, Med Student 11/02/2014, 11:03 AM

## 2014-11-02 NOTE — Progress Notes (Signed)
Subjective: Pt feels abit better today. Complaining of nasal congestion, not exactly SOB. Has a chronic non productive cough. No fever.  Objective: Vital signs in last 24 hours: Filed Vitals:   11/02/14 0126 11/02/14 0159 11/02/14 0554 11/02/14 1011  BP: 160/88 148/82 144/90 146/88  Pulse: 95 92 90 83  Temp:  98 F (36.7 C) 98.2 F (36.8 C)   TempSrc:  Oral Oral   Resp: 22 20 18    Height:      Weight:   276 lb 2.6 oz (125.265 kg)   SpO2: 97% 100% 98% 98%   Weight change: 46 lb 2.6 oz (20.938 kg)  Intake/Output Summary (Last 24 hours) at 11/02/14 1146 Last data filed at 11/02/14 1013  Gross per 24 hour  Intake   1375 ml  Output   1380 ml  Net     -5 ml   General appearance: alert initially but later was appeared sleepy, cooperative, appears stated age and in no distress Lungs: Has some wheezing on exam Heart: regular rate and rhythm, S1, S2 normal, no murmur, click, rub or gallop Abdomen: increased abdominal swelling today, improved compared to yesterday, after 5L of fluid taken off yesterday, with some anarsaca.  Extremities: extremities normal, atraumatic, no cyanosis or edema  Lab Results: Basic Metabolic Panel:  Recent Labs Lab 11/01/14 0522 11/02/14 0329  NA 137 140  K 4.5 4.2  CL 108 112*  CO2 19* 20*  GLUCOSE 89 96  BUN 28* 29*  CREATININE 3.34* 3.06*  CALCIUM 8.3* 8.1*   Liver Function Tests:  Recent Labs Lab 10/31/14 1856 11/01/14 0522  AST 71* 58*  ALT 22 20  ALKPHOS 67 63  BILITOT 2.4* 2.3*  PROT 8.7* 8.0  ALBUMIN 2.3* 2.1*    Recent Labs Lab 10/31/14 1856  AMMONIA 48*   CBC:  Recent Labs Lab 10/31/14 1856 11/01/14 0522 11/02/14 0329  WBC 8.5 8.6 8.0  NEUTROABS 5.5  --   --   HGB 8.1* 7.6* 7.4*  HCT 24.0* 22.2* 22.1*  MCV 78.2 79.9 79.8  PLT 120* 88* 61*   Coagulation:  Recent Labs Lab 10/31/14 2338 11/01/14 0522  LABPROT 18.3* 19.0*  INR 1.51* 1.60*   Urine Drug Screen: Drugs of Abuse     Component Value  Date/Time   LABOPIA NONE DETECTED 10/31/2014 2127   COCAINSCRNUR POSITIVE* 10/31/2014 2127   LABBENZ NONE DETECTED 10/31/2014 2127   AMPHETMU NONE DETECTED 10/31/2014 2127   THCU NONE DETECTED 10/31/2014 2127   LABBARB NONE DETECTED 10/31/2014 2127    Alcohol Level:  Recent Labs Lab 10/31/14 1858  ETH 6*   Urinalysis:  Recent Labs Lab 10/31/14 2127  COLORURINE ORANGE*  LABSPEC 1.018  PHURINE 5.0  GLUCOSEU NEGATIVE  HGBUR NEGATIVE  BILIRUBINUR SMALL*  KETONESUR 15*  PROTEINUR 30*  UROBILINOGEN 1.0  NITRITE NEGATIVE  LEUKOCYTESUR SMALL*   Micro Results: Recent Results (from the past 240 hour(s))  Gram stain     Status: None   Collection Time: 11/01/14  3:23 PM  Result Value Ref Range Status   Specimen Description FLUID ASCITIC  Final   Special Requests NONE  Final   Gram Stain   Final    ABUNDANT WBC PRESENT,BOTH PMN AND MONONUCLEAR NO ORGANISMS SEEN    Report Status 11/01/2014 FINAL  Final   Studies/Results: Dg Chest 2 View  10/31/2014   CLINICAL DATA:  Shortness of breath for 2 days.  EXAM: CHEST  2 VIEW  COMPARISON:  August 21, 2014  FINDINGS: The mediastinal contour is normal. The heart size is enlarged. There is mild diffuse increased interstitial markings in both lungs. There is no focal pneumonia or pleural effusion. The visualized skeletal structures are stable.  IMPRESSION: Mild increased pulmonary interstitium bilaterally. Differential diagnosis includes interstitial edema or infectious/viral etiology.   Electronically Signed   By: Abelardo Diesel M.D.   On: 10/31/2014 20:28   Ir Paracentesis  11/01/2014   INDICATION: Symptomatic ascites.  EXAM: ULTRASOUND-GUIDED PARACENTESIS  COMPARISON:  Previous para  MEDICATIONS: 10 cc 1% lidocaine  COMPLICATIONS: None immediate  TECHNIQUE: Informed written consent was obtained from the patient after a discussion of the risks, benefits and alternatives to treatment. A timeout was performed prior to the initiation of the  procedure.  Initial ultrasound scanning demonstrates a large amount of ascites within the left lower abdominal quadrant. The left lower abdomen was prepped and draped in the usual sterile fashion. 1% lidocaine with epinephrine was used for local anesthesia. Under direct ultrasound guidance, a 19 gauge, 7-cm, Yueh catheter was introduced. An ultrasound image was saved for documentation purposed.The paracentesis was performed. The catheter was removed and a dressing was applied. The patient tolerated the procedure well without immediate post procedural complication.  FINDINGS: A total of approximately 5.3 liters of yellow fluid was removed. Samples were sent to the laboratory as requested by the clinical team.  IMPRESSION: Successful ultrasound-guided paracentesis yielding 5.3 liters of peritoneal fluid.  Read by:  Lavonia Drafts [facility]   Electronically Signed   By: Sandi Mariscal M.D.   On: 11/01/2014 15:41   Medications: I have reviewed the patient's current medications. Scheduled Meds: . folic acid  1 mg Intravenous Daily  . furosemide  40 mg Intravenous BID  . lactulose  30 g Oral TID  . spironolactone  100 mg Oral NOW  . [START ON 11/03/2014] spironolactone  200 mg Oral Daily  . thiamine  100 mg Intravenous Daily   Continuous Infusions:  PRN Meds:.albuterol, LORazepam **OR** LORazepam, promethazine, sodium chloride Assessment/Plan:  1. Decompensated HCV/Alcoholic Cirrhosis: Likely due to noncomplaince. SOB likely due to diaphragmatic splinting due to massive ascites. 5.3L of fluid taken off per US guided paracentesis. Fluid appears to be re-accumulating.  - Cont Lasix 40 mg IV  BID. - Increase spironolactone to 200mg  daily - CIWA - Phenergan 12.5 mg q6hours PRN nausea - Folate/Thiamine - BMEt am- follow K, Cr - CBC am- Follow Platelets (Pt got 2 doses of heparin while on admission for DVT ppx) - Evaluate breathing am after paracentesis - Watch BP - Will avoid hepatotoxics - like tylenol - D/c  O2 as pt was saturating >95% on room air.  2. Acute on CKD IV: Likely pre-renal in the setting of volume overload. Patient received lasix while in ED. - Urine Urea and Cr- pending - Lasix as above - Spironolactone as above. - Check K, Cr am  3. HFpEF: EF 60-65% (06/7104) Grade 1 diastolic dysfunction. Doubt contribution to present symptoms. Chest exam no crackles today. Initial CXR shows possible edema, but dyspnea likely 2/2 decompensated cirrhosis as above. BNP 400 this admission (>1000 at last admission).  - Lasix as above  4. Hx of metabolic Encepahlopthy: Per chart review- 08/04/2014. Pt was noted to be lethargic, requiring sternal rub to wake up, with fluctuating mental status during the day. ?Asterexis- not remarked on. Pt was therefore started on Lactulose. But subsequent notes did not note immediate improvement in symptoms, till ~4 days later. - Will cont Lactulose for  now.   5. Polysubstance Abuse: Patient has history of alcohol abuse with current cocaine use. Patient counseled to abstain from alcohol, cocaine, and other illicit substances. - Social work consult. - CIWA  6. Thrombocytopenia/Anemia: Hgb stable at 7.2 Likely 2/2 longstanding alcohol abuse, anemia of chronic illness- liver, and GI blood loss. Anemia Panel- 5/1- ferritin- 304( setting of liver dx), serum fe- low at 32, B12 and folate normal. GI bleed previous admission, requiring blood transfusion.  - SCDs - Hold anticoag for now, INR elevated. - Will start fe tabs at - 365mg  daily  7. 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  8. NAsal Congestion- Nasal saline spray.   9. HTN- Increased Spironolactone dose to 200mg  daily today. - Cont Lasix at 40 IV BID  FEN/GI -On dysphagia 3 diet.  DVT Ppx: Scds.  Dispo: Disposition is deferred at this time, awaiting improvement of current medical problems.    The patient does not know have a current PCP (No Pcp Per Patient) and does need an  [facility] hospital follow-up appointment after discharge.  The patient does not know have transportation limitations that hinder transportation to clinic appointments.  .Services Needed at time of discharge: Y = Yes, Blank = No PT:   OT:   RN:   Equipment:   Other:     LOS: 2 days   Bethena Roys, MD 11/02/2014, 11:46 AM

## 2014-11-03 DIAGNOSIS — F1010 Alcohol abuse, uncomplicated: Secondary | ICD-10-CM

## 2014-11-03 DIAGNOSIS — J029 Acute pharyngitis, unspecified: Secondary | ICD-10-CM

## 2014-11-03 DIAGNOSIS — E8770 Fluid overload, unspecified: Secondary | ICD-10-CM

## 2014-11-03 DIAGNOSIS — K7210 Chronic hepatic failure without coma: Secondary | ICD-10-CM

## 2014-11-03 DIAGNOSIS — K766 Portal hypertension: Secondary | ICD-10-CM

## 2014-11-03 DIAGNOSIS — R0600 Dyspnea, unspecified: Secondary | ICD-10-CM

## 2014-11-03 DIAGNOSIS — Z9114 Patient's other noncompliance with medication regimen: Secondary | ICD-10-CM

## 2014-11-03 LAB — CBC
HCT: 22.7 % — ABNORMAL LOW (ref 39.0–52.0)
HEMOGLOBIN: 7.6 g/dL — AB (ref 13.0–17.0)
MCH: 27 pg (ref 26.0–34.0)
MCHC: 33.5 g/dL (ref 30.0–36.0)
MCV: 80.8 fL (ref 78.0–100.0)
Platelets: 45 10*3/uL — ABNORMAL LOW (ref 150–400)
RBC: 2.81 MIL/uL — AB (ref 4.22–5.81)
RDW: 17.5 % — ABNORMAL HIGH (ref 11.5–15.5)
WBC: 6.2 10*3/uL (ref 4.0–10.5)

## 2014-11-03 LAB — BASIC METABOLIC PANEL
Anion gap: 6 (ref 5–15)
BUN: 26 mg/dL — AB (ref 6–20)
CHLORIDE: 110 mmol/L (ref 101–111)
CO2: 22 mmol/L (ref 22–32)
CREATININE: 2.57 mg/dL — AB (ref 0.61–1.24)
Calcium: 8.1 mg/dL — ABNORMAL LOW (ref 8.9–10.3)
GFR calc non Af Amer: 26 mL/min — ABNORMAL LOW (ref >60)
GFR, EST AFRICAN AMERICAN: 30 mL/min — AB (ref >60)
Glucose, Bld: 78 mg/dL (ref 65–99)
Potassium: 4.1 mmol/L (ref 3.5–5.1)
Sodium: 138 mmol/L (ref 135–145)

## 2014-11-03 LAB — UREA NITROGEN, URINE: Urea Nitrogen, Ur: 136 mg/dL

## 2014-11-03 MED ORDER — FUROSEMIDE 10 MG/ML IJ SOLN
60.0000 mg | Freq: Two times a day (BID) | INTRAMUSCULAR | Status: DC
  Administered 2014-11-03 – 2014-11-04 (×3): 60 mg via INTRAVENOUS
  Filled 2014-11-03 (×4): qty 6

## 2014-11-03 MED ORDER — LACTULOSE 10 GM/15ML PO SOLN
30.0000 g | Freq: Two times a day (BID) | ORAL | Status: DC
  Administered 2014-11-04 – 2014-11-06 (×6): 30 g via ORAL
  Filled 2014-11-03 (×9): qty 45

## 2014-11-03 MED ORDER — SPIRONOLACTONE 100 MG PO TABS
300.0000 mg | ORAL_TABLET | Freq: Every day | ORAL | Status: DC
  Administered 2014-11-03 – 2014-11-05 (×3): 300 mg via ORAL
  Filled 2014-11-03 (×4): qty 3

## 2014-11-03 NOTE — Progress Notes (Signed)
Physical Therapy Treatment Patient Details Name: John [name] MRN: [id number] DOB: 09-29-1955 Today's Date: Nov 29, 2014    History of Present Illness 59 y.o. male with a history of alcoholism, cirrhosis, coagulopathy, and polysubstance abuse, HTN, chronic back pain admitted with fluid overload and SOB    PT Comments    Pt with excellent progression with mobility and endurance. Pt reports increased mobility and strength but continues to fatigue rather quickly and does not have significant assist at home. Will continue to follow and maximize mobility.   Follow Up Recommendations  SNF;Supervision for mobility/OOB     Equipment Recommendations       Recommendations for Other Services       Precautions / Restrictions Precautions Precautions: Fall Restrictions Weight Bearing Restrictions: No    Mobility  Bed Mobility               General bed mobility comments: pt up in recliner  Transfers     Transfers: Sit to/from Stand Sit to Stand: Min guard         General transfer comment: cues for correct hand placement   Ambulation/Gait Ambulation/Gait assistance: Min guard Ambulation Distance (Feet): 300 Feet Assistive device: Rolling walker (2 wheeled) Gait Pattern/deviations: Step-through pattern;Decreased stride length   Gait velocity interpretation: Below normal speed for age/gender General Gait Details: cues for posture and position in rW   Stairs            Wheelchair Mobility    Modified Rankin (Stroke Patients Only)       Balance Overall balance assessment: Needs assistance   Sitting balance-Leahy Scale: Good       Standing balance-Leahy Scale: Fair                      Cognition Arousal/Alertness: Awake/alert Behavior During Therapy: WFL for tasks assessed/performed Overall Cognitive Status: Within Functional Limits for tasks assessed                      Exercises General Exercises - Lower Extremity Long Arc  Quad: AROM;Seated;Both;20 reps Hip ABduction/ADduction: AROM;Seated;Both;20 reps Hip Flexion/Marching: AROM;Seated;Both;20 reps Toe Raises: AROM;Seated;Both;20 reps Heel Raises: AROM;Seated;Both;20 reps    General Comments        Pertinent Vitals/Pain Pain Assessment: No/denies pain  HR 86 sats 99% RA    Home Living                      Prior Function            PT Goals (current goals can now be found in the care plan section) Progress towards PT goals: Progressing toward goals    Frequency       PT Plan Current plan remains appropriate    Co-evaluation             End of Session Equipment Utilized During Treatment: Gait belt Activity Tolerance: Patient tolerated treatment well Patient left: in chair;with call bell/phone within reach;with chair alarm set     Time: 5885-0277 PT Time Calculation (min) (ACUTE ONLY): 15 min  Charges:  $Gait Training: 8-22 mins                    G Codes:      Melford Aase 11/29/14, 12:35 PM Elwyn Reach, [place]

## 2014-11-03 NOTE — Progress Notes (Signed)
S:  He still notes nasal congestion and a sore throat this morning.  Subjectively his breathing is improved.  O: BP 160/92, P 86, R 20, T 97.3, SpO2 99% on RA, Wt 233 (up 3) Gen.: Well-developed, well-nourished, man lying comfortably in bed in no acute distress. HEENT: No oral pharyngeal erythema or exudates appreciated. Lungs: Clear to auscultation bilaterally without wheezes, rhonchi, or rales. Heart: Regular rate and rhythm without murmurs, rubs, or gallops. Abdomen: Markedly distended, soft, nontender, active bowel sounds. Extremities: Marked pitting edema bilaterally. Skin: Mild anasarca to the abdomen, most prominent in the abdominal gutters.  Lab results:  Basic Metabolic Panel:  Recent Labs  11/02/14 0329 11/03/14 0402  NA 140 138  K 4.2 4.1  CL 112* 110  CO2 20* 22  GLUCOSE 96 78  BUN 29* 26*  CREATININE 3.06* 2.57*  CALCIUM 8.1* 8.1*   CBC:  Recent Labs  10/31/14 1856  11/02/14 0329 11/03/14 0402  WBC 8.5  < > 8.0 6.2  NEUTROABS 5.5  --   --   --   HGB 8.1*  < > 7.4* 7.6*  HCT 24.0*  < > 22.1* 22.7*  MCV 78.2  < > 79.8 80.8  PLT 120*  < > 61* 45*  < > = values in this interval not displayed.  Assessment  Mr. Heidenreich is a 59 year old man with alcoholic and hep C cirrhosis complicated by portal hypertension and ascites. He presents with total body volume overload and significant ascites causing dyspnea. A 5.3 L large-volume paracentesis resolved his dyspnea yet he remains total body fluid overloaded. The cause of his decompensation is related to medical noncompliance with his diuretics and his follow-up. Although his renal failure has been improving his weight has not budged.  Plan  1) Decompensated chronic liver failure with portal hypertension and massive ascites: We will increase his spironolactone to 300 mg by mouth daily and his Lasix to 60 mg IV twice daily. We are able to do this as his creatinine has continued to improve despite our attempts at  diuresis. If we are able to successfully remove some volume via this diuresis we will either continue at that dose or escalate as tolerated. One strategy may be to combine the Lasix dose to 120 mg once daily if one believes in the threshold therapy for loop diuretics. We will continue with the aggressive diuresis until his BUN and creatinine bump slightly suggesting that we've pushed the intravascular volume status as far as we can.  2) Sore throat: We will continue with the throat lozanges.  3) Thrombocytopenia: His platelet count has dropped more than 50% after receiving 2 doses of heparin. This may be related to heparin-induced thrombocytopenia. We have held the heparin for several days. He has no evidence of bleeding or thrombosis at this time. We will continue to follow this closely with serial CBCs. Of note, his for T's hit score is 3 suggesting low clinical probability of heparin-induced thrombocytopenia.  4) Disposition: I still anticipate several more days of aggressive intravenous diuresis and adjustments of his spironolactone dose in order to get better control of his total body volume overload and ascites.

## 2014-11-03 NOTE — Progress Notes (Signed)
Subjective: Patient continues to complain of nasal congestion and sore throat. Patient denies shortness of breath  Objective: Vital signs in last 24 hours: Filed Vitals:   11/03/14 0149 11/03/14 0543 11/03/14 1052 11/03/14 1232  BP: 179/89 156/88 160/92   Pulse: 91 89 79 86  Temp: 98.3 F (36.8 C) 97.3 F (36.3 C)    TempSrc: Oral Oral    Resp: 20 20    Height:      Weight:  105.915 kg (233 lb 8 oz)    SpO2: 98% 98%  99%   Last standing weights:  10/31/2014:  230 lb 11/03/2014:  233 lb 8 oz   Intake/Output Summary (Last 24 hours) at 11/03/14 1435 Last data filed at 11/03/14 1405  Gross per 24 hour  Intake    835 ml  Output    852 ml  Net    -17 ml   General Appearance: Well-developed, well-nourished, conversant and in NAD Head: Normocephalic,atraumatic Nose: Crusting at nares Eyes: Scleral icterus present Throat: Mild posterior pharynx erythema Lungs: No increased work of breathing on RA; CTAB; no distress Heart: Regular rate and rhythm, S1 and S2 normal, no murmur, rub or gallop Abdomen: Distended, tense, tender at sites of prior paracentesis drainiage; BS+; anasarca appears mildly improved from yesterday  Extremities: Significant bilateral bilateral pitting edema of the lower extremities and abdomen; 2+ DP pulses bilaterally Skin: Skin is warm and dry  Labs: Basic Metabolic Panel:  Recent Labs Lab 11/02/14 0329 11/03/14 0402  NA 140 138  K 4.2 4.1  CL 112* 110  CO2 20* 22  GLUCOSE 96 78  BUN 29* 26*  CREATININE 3.06* 2.57*  CALCIUM 8.1* 8.1*   Liver Function Tests:  Recent Labs Lab 10/31/14 1856 11/01/14 0522  AST 71* 58*  ALT 22 20  ALKPHOS 67 63  BILITOT 2.4* 2.3*  PROT 8.7* 8.0  ALBUMIN 2.3* 2.1*    Recent Labs Lab 10/31/14 1856  AMMONIA 48*   CBC:  Recent Labs Lab 10/31/14 1856  11/02/14 0329 11/03/14 0402  WBC 8.5  < > 8.0 6.2  NEUTROABS 5.5  --   --   --   HGB 8.1*  < > 7.4* 7.6*  HCT 24.0*  < > 22.1* 22.7*  MCV  78.2  < > 79.8 80.8  PLT 120*  < > 61* 45*  < > = values in this interval not displayed.   Recent Labs Lab 10/31/14 2338 11/01/14 0522  LABPROT 18.3* 19.0*  INR 1.51* 1.60*   Urine Drug Screen: Drugs of Abuse     Component Value Date/Time   LABOPIA NONE DETECTED 10/31/2014 2127   COCAINSCRNUR POSITIVE* 10/31/2014 2127   LABBENZ NONE DETECTED 10/31/2014 2127   AMPHETMU NONE DETECTED 10/31/2014 2127   THCU NONE DETECTED 10/31/2014 2127   LABBARB NONE DETECTED 10/31/2014 2127    Alcohol Level:  Recent Labs Lab 10/31/14 1858  ETH 6*   Urinalysis:  Recent Labs Lab 10/31/14 2127  COLORURINE ORANGE*  LABSPEC 1.018  PHURINE 5.0  GLUCOSEU NEGATIVE  HGBUR NEGATIVE  BILIRUBINUR SMALL*  KETONESUR 15*  PROTEINUR 30*  UROBILINOGEN 1.0  NITRITE NEGATIVE  LEUKOCYTESUR SMALL*    Medications:  Scheduled Medications: . folic acid  1 mg Intravenous Daily  . furosemide  60 mg Intravenous BID  . lactulose  30 g Oral BID  . spironolactone  300 mg Oral Daily  . thiamine  100 mg Intravenous Daily   PRN Medications: albuterol, LORazepam **OR** LORazepam, phenol-menthol, promethazine, sodium  chloride    Assessment/Plan: Principal Problem:   Alcoholic cirrhosis of liver with ascites Active Problems:   Essential hypertension, benign   Polysubstance abuse   Abnormal LFTs   Acute on chronic renal failure   Fluid overload   Hepatitis C   Ascites of liver  Mr. Bring is a 59 yo male with a history of alcoholic and HCV cirrhosis complicated by portal hypertension and ascites, polysubstance abuse (EtOH and cocaine), CHF, and hypertension who presented to [facility] on 10/31/14 with a 3 day history of worsening shortness of breath, consistent with decompensated cirrhosis after not taking any of his diuretics or other medications for the past month.  #Total body volume overload secondary to decompensated HCV/Alcoholic Cirrhosis: After LVP on 11/01/14 (5.3L removed), patient's SOB  improved (and remains so), but he continues to be significantly distended and anasarcous. With his improved breathing, it is likely that his dyspnea was secondary to excursion of diaphragms due to ascites. It is unlikely that his HPpEF exacerbation is contributory given BNP of only 400 on admission (>1000 at last admission) and benign lung exam. His anasarca appears mildly improved; however, he remains distended. Additionally, patient's weight has not decreased (230lb on admission, now 233 lbs). Plan is to increase his spironolactone to 300mg  daily and increase his current Lasix to 60mg  IV BID while closely monitoring his K, Cr, and anasarca/abdominal distension. We will continue to emphasize the importance of medication compliance and follow-up with PCP and gastroenterologist. Notably, ascitic fluid draw cell count on 11/01/14 not concerning for SBP. Cell culture shows no growth at 2 days. Finally, per chart review, diagnosis of hepatic encephalopathy appears to have first been assigned on 08/14/14. First administration of lactulose appears to be on 08/17/14, with note indicating that patient appeared moderately lethargic, oriented to self, and with speech difficult to make out. No mention of asterixis.  Patient has not been encephalopathic during this admission (after not taking for home lactulose for 1-2 months). Lactulose was decreased today to 30g BID. The need to continue lactulose as an outpatient will be addressed closer to discharge.  - Increase Lasix 60mg  IV BID - Increase spironlactone to 300mg  daily - Decrease lactulose to 30g BID - Consider repeat paracentesis on 11/04/14 (3 days after prior LVP) if patient is symptomatic; last LVP performed by Interventional Radiology on 11/01/14 pm [ ]  F/u ascitic cell cultures, no growth at 2 days; cell count show 14 PMNs - Creatine continues to improve: 3.34 -> 3.06 -> 2.57 (today); K stable; repeat BMP tomorrow am - Continue CIWA - Continue Phenergan 12.5 mg  q6hours PRN nausea - Continue Folate/Thiamine  #Thrombocytopenia/Anemia: Likely 2/2 longstanding alcohol abuse, liver disease, and anemia of chronic disease. CBC this morning demonstrated stable hemoglobin, but platelets down from 61 --> 45. Patient did receive 2x 5000U heparin Beecher injections at time of admission; concern for HIT.  Platelet transfusion threshold = 10. Will draw repeat CBC tomorrow morning. - INR elevated; continue to anticoagulation; continue SCDs - Trend platelets; morning CBC (11/04/14)   #Acute on CKD IV: Creatinine improving per above; will continue to monitor. Likely pre-renal in the setting of volume overload. Patient received lasix while in ED. FeUrea (calculated from labs drawn on 11/01/14) = 28%.  A fractional excretion of urea (FEUrea) of < 35% is suggestive of pre-renal azotemia.  - Lasix and spironolactone as above - Trend creatinine per above  #Nasal congestion/sore throat. Patient complains nasal congestion and sore throat (with mild posterior pharyngeal erythema); most likely  viral etiology - Continue PRN throat lozenges and saline nasal spray  #Polysubstance Abuse: Patient has history of alcohol abuse with current cocaine use. Patient counseled to abstain from alcohol, cocaine, and other illicit substances. - Social work consult - Continue CIWA  #Abnormal ECG: Resolved. On admission EKG there was initial concern for absence of P waves/possible A fib. Repeat showed no concerning findings and no change from previous ECG on 4/17/116 other than the LVH by voltage in aVL. No telemetry needed.  #HFpEF: EF 60-65% (0/2774) Grade 1 diastolic dysfunction. CXR shows possible volume overload, but initial dyspnea likely 2/2 decompensated cirrhosis per above. BNP 400 this admission (>1000 at last admission).  - Continue Lasix as above  #Elevated Transaminases/Bilirubin: Likely 2/2 HCV/alcohol hepatitis.   FEN/GI/PPx -Diet = Dysphagia Mechanically Advanced Diet (Level  3); thin liquids -Fluids = None -Electrolytes = CTM -DVT = SCDs  Code Status -Full code   Disposition -Disposition is deferred at this time, awaiting improvement of fluid overload.  -Anticipated discharge in approximately 3-4 days.  -The patient does not have a current PCP (No PCP Per Patient) and does need an [facility] hospital follow-up appointment after discharge.    SERVICE NEEDED AT [place]         Y = Yes, Blank = No PT:   OT:   RN:   Equipment:   Other:    Length of Stay: 3 day(s)  This is a Careers information officer Note.  The care of the patient was discussed with Dr. Eppie Gibson and the assessment and plan formulated with their assistance.  Please see their attached note for official documentation of the daily encounter.   LOS: 3 days   Quita Skye, Med Student 11/03/2014, 2:35 PM

## 2014-11-03 NOTE — Progress Notes (Signed)
Occupational Therapy Treatment Patient Details Name: John [name] MRN: [id number] DOB: Aug 29, 1955 Today's Date: 11/03/2014    History of present illness 59 y.o. male with a history of alcoholism, cirrhosis, coagulopathy, and polysubstance abuse, HTN, chronic back pain admitted with fluid overload and SOB   OT comments  Pt making good progress with functional goals and was able to stand at sink for bathing and grooming this morning with RW. Pt more alert, talkative and feeling better in general per pt report. Pt should continue with acute OT services to increase level of function and safety  Follow Up Recommendations  SNF;Supervision/Assistance - 24 hour    Equipment Recommendations  Tub/shower seat;Other (comment) (TBD at next venue of care)    Recommendations for Other Services      Precautions / Restrictions Precautions Precautions: Fall Restrictions Weight Bearing Restrictions: No       Mobility Bed Mobility Overal bed mobility: Needs Assistance Bed Mobility: Supine to Sit     Supine to sit: Min guard;HOB elevated     General bed mobility comments: used rails, no physical assist required  Transfers Overall transfer level: Needs assistance Equipment used: Rolling walker (2 wheeled) Transfers: Sit to/from Stand Sit to Stand: Min guard         General transfer comment: cues for correct hand placement     Balance Overall balance assessment: Needs assistance   Sitting balance-Leahy Scale: Good     Standing balance support: During functional activity Standing balance-Leahy Scale: Fair                     ADL       Grooming: Wash/dry hands;Wash/dry face;Standing;Min guard;Oral care;Cueing for safety Grooming Details (indicate cue type and reason): pt stood at sink     Lower Body Bathing: Minimal assistance;Sit to/from stand;Cueing for safety Lower Body Bathing Details (indicate cue type and reason): pt stood at sink     Lower Body Dressing:  Moderate assistance;Cueing for safety Lower Body Dressing Details (indicate cue type and reason): donning socks     Toileting- Clothing Manipulation and Hygiene: Sit to/from stand;Minimal assistance       Functional mobility during ADLs: Cueing for safety;Min guard;Rolling walker        Vision  no change from baseline                   Perception Perception Perception Tested?: No   Praxis Praxis Praxis tested?: Not tested    Cognition   Behavior During Therapy: WFL for tasks assessed/performed Overall Cognitive Status: Within Functional Limits for tasks assessed                       Extremity/Trunk Assessment   generalized weakness                       General Comments  pt very pleasant and cooperative    Pertinent Vitals/ Pain       Pain Assessment: No/denies pain  Home Living  home alone                                        Prior Functioning/Environment  independent           Frequency Min 2X/week     Progress Toward Goals  OT Goals(current goals can now be found in the care plan  section)  Progress towards OT goals: Progressing toward goals  Acute Rehab OT Goals Patient Stated Goal: return home  Plan Discharge plan remains appropriate                     End of Session Equipment Utilized During Treatment: Gait belt;Rolling walker   Activity Tolerance Patient tolerated treatment well   Patient Left in chair;with chair alarm set;with call bell/phone within reach             Time: 9276-3943 OT Time Calculation (min): 27 min  Charges: OT General Charges $OT Visit: 1 Procedure OT Treatments $Self Care/Home Management : 8-22 mins $Therapeutic Activity: 8-22 mins  Britt Bottom 11/03/2014, 12:46 PM

## 2014-11-04 DIAGNOSIS — I130 Hypertensive heart and chronic kidney disease with heart failure and stage 1 through stage 4 chronic kidney disease, or unspecified chronic kidney disease: Secondary | ICD-10-CM

## 2014-11-04 DIAGNOSIS — Z8661 Personal history of infections of the central nervous system: Secondary | ICD-10-CM

## 2014-11-04 DIAGNOSIS — Z9889 Other specified postprocedural states: Secondary | ICD-10-CM

## 2014-11-04 LAB — BASIC METABOLIC PANEL
Anion gap: 5 (ref 5–15)
BUN: 23 mg/dL — AB (ref 6–20)
CO2: 24 mmol/L (ref 22–32)
CREATININE: 2.34 mg/dL — AB (ref 0.61–1.24)
Calcium: 8.2 mg/dL — ABNORMAL LOW (ref 8.9–10.3)
Chloride: 110 mmol/L (ref 101–111)
GFR calc Af Amer: 34 mL/min — ABNORMAL LOW (ref >60)
GFR, EST NON AFRICAN AMERICAN: 29 mL/min — AB (ref >60)
Glucose, Bld: 90 mg/dL (ref 65–99)
Potassium: 4.3 mmol/L (ref 3.5–5.1)
Sodium: 139 mmol/L (ref 135–145)

## 2014-11-04 LAB — CBC
HCT: 23.7 % — ABNORMAL LOW (ref 39.0–52.0)
Hemoglobin: 8 g/dL — ABNORMAL LOW (ref 13.0–17.0)
MCH: 27.4 pg (ref 26.0–34.0)
MCHC: 33.8 g/dL (ref 30.0–36.0)
MCV: 81.2 fL (ref 78.0–100.0)
PLATELETS: 48 10*3/uL — AB (ref 150–400)
RBC: 2.92 MIL/uL — AB (ref 4.22–5.81)
RDW: 17.4 % — AB (ref 11.5–15.5)
WBC: 5.7 10*3/uL (ref 4.0–10.5)

## 2014-11-04 MED ORDER — FOLIC ACID 1 MG PO TABS
1.0000 mg | ORAL_TABLET | Freq: Every day | ORAL | Status: DC
  Administered 2014-11-04 – 2014-11-08 (×5): 1 mg via ORAL
  Filled 2014-11-04 (×5): qty 1

## 2014-11-04 MED ORDER — CARVEDILOL 6.25 MG PO TABS
6.2500 mg | ORAL_TABLET | Freq: Two times a day (BID) | ORAL | Status: DC
  Administered 2014-11-04: 6.25 mg via ORAL
  Filled 2014-11-04 (×2): qty 1

## 2014-11-04 MED ORDER — CARVEDILOL 6.25 MG PO TABS
6.2500 mg | ORAL_TABLET | Freq: Two times a day (BID) | ORAL | Status: DC
  Administered 2014-11-05 – 2014-11-08 (×6): 6.25 mg via ORAL
  Filled 2014-11-04 (×8): qty 1

## 2014-11-04 MED ORDER — VITAMIN B-1 100 MG PO TABS
100.0000 mg | ORAL_TABLET | Freq: Every day | ORAL | Status: DC
  Administered 2014-11-04 – 2014-11-08 (×5): 100 mg via ORAL
  Filled 2014-11-04 (×5): qty 1

## 2014-11-04 MED ORDER — FUROSEMIDE 10 MG/ML IJ SOLN
60.0000 mg | INTRAMUSCULAR | Status: AC
  Administered 2014-11-04: 60 mg via INTRAVENOUS

## 2014-11-04 MED ORDER — FUROSEMIDE 10 MG/ML IJ SOLN
120.0000 mg | Freq: Every day | INTRAVENOUS | Status: DC
  Administered 2014-11-05 – 2014-11-06 (×2): 120 mg via INTRAVENOUS
  Filled 2014-11-04 (×3): qty 12

## 2014-11-04 MED ORDER — FERROUS SULFATE 325 (65 FE) MG PO TABS
325.0000 mg | ORAL_TABLET | Freq: Every day | ORAL | Status: DC
  Administered 2014-11-04 – 2014-11-08 (×5): 325 mg via ORAL
  Filled 2014-11-04 (×5): qty 1

## 2014-11-04 NOTE — Progress Notes (Signed)
Subjective: Patient states that he feels better this morning. His breathing remains unlabored. Patient noted episode of nose bleed this morning which stopped with pressure after a few minutes. Patient states that he feels his legs are less swollen today compared to yesterday.  He states he has had occasional slow leak from his paracentesis site.  Objective: Vital signs in last 24 hours: Filed Vitals:   11/03/14 1232 11/03/14 2123 11/04/14 0548 11/04/14 1116  BP:  165/83 170/94 109/61  Pulse: 86 85 82 79  Temp:  98.1 F (36.7 C) 98.1 F (36.7 C)   TempSrc:  Oral Oral   Resp:  18 18   Height:      Weight:   104.826 kg (231 lb 1.6 oz)   SpO2: 99% 99% 98%    Weight change: -1.089 kg (-2 lb 6.4 oz)  Intake/Output Summary (Last 24 hours) at 11/04/14 1128 Last data filed at 11/04/14 1118  Gross per 24 hour  Intake   1850 ml  Output   1475 ml  Net    375 ml   Last standing weights:  10/31/2014: 230 lb 11/03/2014: 233 lb 8 oz 11/04/2014:  231 lb 1.6 oz  General Appearance: Well-developed, well-nourished, conversant and in NAD Head: Normocephalic,atraumatic Nose: Crusting at nares Eyes: Scleral icterus present Lungs: No increased work of breathing on RA; CTAB; no distress Heart: Regular rate and rhythm, S1 and S2 normal, no murmur, rub or gallop Abdomen:Distended, tense, tender at sites of prior paracentesis drainiage; BS+; anasarca appears mildly improved from yesterday  Extremities: Significant bilateral bilateral pitting edema of the lower extremities and abdomen, slightly improved today; 2+ DP pulses bilaterally Skin: Skin is warm and dry  Labs: Basic Metabolic Panel:  Recent Labs Lab 11/03/14 0402 11/04/14 0433  NA 138 139  K 4.1 4.3  CL 110 110  CO2 22 24  GLUCOSE 78 90  BUN 26* 23*  CREATININE 2.57* 2.34*  CALCIUM 8.1* 8.2*   Liver Function Tests:  Recent Labs Lab 10/31/14 1856 11/01/14 0522  AST 71* 58*  ALT 22 20  ALKPHOS 67 63  BILITOT 2.4*  2.3*  PROT 8.7* 8.0  ALBUMIN 2.3* 2.1*   CBC:  Recent Labs Lab 10/31/14 1856  11/03/14 0402 11/04/14 0433  WBC 8.5  < > 6.2 5.7  NEUTROABS 5.5  --   --   --   HGB 8.1*  < > 7.6* 8.0*  HCT 24.0*  < > 22.7* 23.7*  MCV 78.2  < > 80.8 81.2  PLT 120*  < > 45* 48*  < > = values in this interval not displayed.  Coagulation:  Recent Labs Lab 10/31/14 2338 11/01/14 0522  LABPROT 18.3* 19.0*  INR 1.51* 1.60*   Urine Drug Screen: Drugs of Abuse     Component Value Date/Time   LABOPIA NONE DETECTED 10/31/2014 2127   COCAINSCRNUR POSITIVE* 10/31/2014 2127   LABBENZ NONE DETECTED 10/31/2014 2127   AMPHETMU NONE DETECTED 10/31/2014 2127   THCU NONE DETECTED 10/31/2014 2127   LABBARB NONE DETECTED 10/31/2014 2127    Alcohol Level:  Recent Labs Lab 10/31/14 1858  ETH 6*   Urinalysis:  Recent Labs Lab 10/31/14 2127  COLORURINE ORANGE*  LABSPEC 1.018  PHURINE 5.0  GLUCOSEU NEGATIVE  HGBUR NEGATIVE  BILIRUBINUR SMALL*  KETONESUR 15*  PROTEINUR 30*  UROBILINOGEN 1.0  NITRITE NEGATIVE  LEUKOCYTESUR SMALL*    Medications:  Scheduled Medications: . folic acid  1 mg Oral Daily  . furosemide  60 mg  Intravenous BID  . lactulose  30 g Oral BID  . spironolactone  300 mg Oral Daily  . thiamine  100 mg Oral Daily   PRN Medications: albuterol, phenol-menthol, promethazine, sodium chloride   Assessment/Plan: Principal Problem:   Alcoholic cirrhosis of liver with ascites Active Problems:   Essential hypertension, benign   Polysubstance abuse   Abnormal LFTs   Acute on chronic renal failure   Fluid overload   Hepatitis C   Ascites of liver  John [name] is a 59 yo male with a history of alcoholic and HCV cirrhosis complicated by portal hypertension and ascites, polysubstance abuse (EtOH and cocaine), CHF, and hypertension who presented to [facility] on 10/31/14 with a 3 day history of worsening shortness of breath, consistent with decompensated cirrhosis after not taking  any of his diuretics or other medications for the past month.  Patient is now s/p 5.3L paracenteses, but remains volume overloaded with significant ascites and anasarca. We will continue to diuresis him with spironolactone and Lasix per below.  #Total body volume overload secondary to decompensated HCV/Alcoholic Cirrhosis: After LVP on 11/01/14 (5.3L removed), patient's SOB improved (and remains so), but he continues to be significantly distended and anasarcous.  Today, his anasarca and lower extremity edema appears mildly improved; however, he remains significantly distended. With his improved breathing, it is likely that his dyspnea was secondary to excursion of diaphragms due to ascites. It is unlikely that his HPpEF exacerbation is contributory given BNP of only 400 on admission (>1000 at last admission) and benign lung exam.  Additionally, patient's weight has not decreased (230lb on admission, now 231 lbs 1oz, down 2 lbs from yesteday). Plan is continue his spironolactone to 300mg  daily and change his current Lasix to 120mg  IV daily while closely monitoring his K, Cr, and anasarca/abdominal distension. In patients with CKD, the "threshold" for IV Lasix can increase to 80 to 160 IV per dose. It is possible that the prior doses of Lasix 60mg  IV BID were not sufficient to reach threshold for effectiveness.  Given patient's improving creatinine and stable K, we may consider increasing his spironolactone and/or Lasix at a later time. We will continue to emphasize the importance of medication compliance and follow-up with PCP and gastroenterologist.Finally, we are continuing Lactulose at 30g BID; patient had 4 stools yesterday. The need to continue lactulose as an outpatient will be addressed closer to discharge.  - Change Lasix to once daily dosing: 160mg  IV QD - Continue spironolactone at 300mg  daily - Continue lactulose to 30g BID - Creatine continues to improve: 3.34 -> 3.06 -> 2.57 -> 2.34; K remains  stable; repeat BMP tomorrow am - Consider repeat paracentesis on 11/04/14 (3 days after prior LVP) if patient is symptomatic; last LVP performed by Interventional Radiology on 11/01/14 pm  #Hypertension: Patient has had systolic BPs in the 034V to 170s throughout hospitalization. This morning, BP measured 170/94. Will start Coreg given its alpha blocking properties and patient's history of cocaine abuse (to avoid unopposed alpha blockade).  Patient also has history of esophageal variceal bleeding and is s/p balloon-occluded retrograde transvenous obliteration on 08/15/2014. Non-selective beta-blockers decrease risk of bleeding +/- decrease mortality in patients with medium to large varices, however, in such patients with systemic HTN, carvedilol can decrease intrahepatic vasculature resistance due to its alpha-1 blocking properties and may be also be effective in prophylaxis of variceal bleeding. - Start Coreg (carvedilol) 6.25 BID with meals  #Thrombocytopenia: Likely 2/2 longstanding alcohol abuse, liver disease, and anemia of  chronic disease. CBC this morning demonstrated stable hemoglobin. Platelets have stabilized from yesterday: 120 -> 88 -> 61 -> 45 -> 48 today. Patient did receive 2x 5000U heparin Kennan injections at time of admission and there was some concern for HIT given that his platelet count dropped more than 50%. Patient 's 4Ts score for heparin-induced thrombocytopnia, however, was 3 = low probability of HIT (<5%). Given his now stabilized platelet count and low 4T score, no concern for HIT.   - INR elevated; continue SCDs  #Anemia: Patient had iron studies performed on 08/28/2014: serum Fe = 32, TIBC = 237, Fe saturation = 14%, ferritin = 304. Given low hemoglobin (8.0 on 11/03/2014), will start oral Fe supplementation. - Give oral Fe supplementation 365mg  qd  #Acute on CKD IV: Creatinine improving per above.  Admission FeUrea (calculated from labs drawn on 11/01/14) = 28%. A fractional excretion  of urea (FEUrea) of < 35% is suggestive of pre-renal azotemia.  - Lasix and spironolactone as above - Trend creatinine per above  #Nasal congestion/sore throat. Patient complains nasal congestion and sore throat (with mild posterior pharyngeal erythema); most likely viral etiology. Improved today. - Continue PRN throat lozenges and saline nasal spray  #Polysubstance Abuse: Patient has history of alcohol abuse with current cocaine use. Patient counseled to abstain from alcohol, cocaine, and other illicit substances. - Social work consult - Discontinue CIWA; max CIWA-Ar total = 2 - Continue Phenergan 12.5 mg q6hours PRN nausea - Continue Folate/Thiamine  #Abnormal ECG: Resolved. On admission EKG there was initial concern for absence of P waves/possible A fib. Repeat showed no concerning findings and no change from previous ECG on 4/17/116 other than the LVH by voltage in aVL. No telemetry needed.  #HFpEF: EF 60-65% (09/7542) Grade 1 diastolic dysfunction. CXR shows possible volume overload, but initial dyspnea likely 2/2 decompensated cirrhosis per above. BNP 400 this admission (>1000 at last admission).  - Continue Lasix as above  #Elevated Transaminases/Bilirubin: Likely 2/2 HCV/alcohol hepatitis.   #FEN/GI/PPx -Diet = Dysphagia 3 and salt restriction. -Fluids = Restrict -Electrolytes = CTM -DVT = SCDs  Code Status -Full code   #Disposition -Anticipated discharge in approximately 3-4 day(s).  -The patient does not have a current PCP (No PCP Per Patient) and does need an [facility] hospital follow-up appointment after discharge.   -Does the patient have transportation limitations that hinder transportation to clinic appointments? unknown  [place]         Y = Yes, Blank = No PT:   OT:   RN:   Equipment:   Other:    Length of Stay: 4 day(s)  This is a Careers information officer Note.  The care of the patient was discussed with  Dr. Denton Brick and the assessment and plan formulated with their assistance.  Please see their attached note for official documentation of the daily encounter.   LOS: 4 days   Quita Skye, Med Student 11/04/2014, 11:28 AM

## 2014-11-04 NOTE — Progress Notes (Signed)
Subjective: Feels better today. Says he has some fluid leaking from the left side of his abdomen, where he had his paracentesis done- 11/01/2014. Otherwise breathing is better, and sorethroat has improved.    Objective: Vital signs in last 24 hours: Filed Vitals:   11/03/14 2123 11/04/14 0548 11/04/14 1116 11/04/14 1149  BP: 165/83 170/94 109/61 174/98  Pulse: 85 82 79 77  Temp: 98.1 F (36.7 C) 98.1 F (36.7 C)    TempSrc: Oral Oral    Resp: 18 18    Height:      Weight:  231 lb 1.6 oz (104.826 kg)    SpO2: 99% 98%     Weight change: -2 lb 6.4 oz (-1.089 kg)  Intake/Output Summary (Last 24 hours) at 11/04/14 1245 Last data filed at 11/04/14 1239  Gross per 24 hour  Intake   2086 ml  Output   1800 ml  Net    286 ml   General appearance: alert, cooperative, appears stated age and no distress Head: Normocephalic, without obvious abnormality, atraumatic Lungs: clear to auscultation bilaterally, patient able to tolerate the head of the bed lying lower than prior.  Heart: regular rate and rhythm, S1, S2 normal, no murmur, click, rub or gallop Abdomen: soft, non-tender but distended, bowel sounds not heard  likely due to ascites,  Extremities: trace pitting,  Lab Results: Basic Metabolic Panel:  Recent Labs Lab 11/03/14 0402 11/04/14 0433  NA 138 139  K 4.1 4.3  CL 110 110  CO2 22 24  GLUCOSE 78 90  BUN 26* 23*  CREATININE 2.57* 2.34*  CALCIUM 8.1* 8.2*   Liver Function Tests:  Recent Labs Lab 10/31/14 1856 11/01/14 0522  AST 71* 58*  ALT 22 20  ALKPHOS 67 63  BILITOT 2.4* 2.3*  PROT 8.7* 8.0  ALBUMIN 2.3* 2.1*    Recent Labs Lab 10/31/14 1856  AMMONIA 48*   CBC:  Recent Labs Lab 10/31/14 1856  11/03/14 0402 11/04/14 0433  WBC 8.5  < > 6.2 5.7  NEUTROABS 5.5  --   --   --   HGB 8.1*  < > 7.6* 8.0*  HCT 24.0*  < > 22.7* 23.7*  MCV 78.2  < > 80.8 81.2  PLT 120*  < > 45* 48*  < > = values in this interval not displayed.  Recent Labs Lab  10/31/14 2338 11/01/14 0522  LABPROT 18.3* 19.0*  INR 1.51* 1.60*   Urine Drug Screen: Drugs of Abuse     Component Value Date/Time   LABOPIA NONE DETECTED 10/31/2014 2127   COCAINSCRNUR POSITIVE* 10/31/2014 2127   LABBENZ NONE DETECTED 10/31/2014 2127   AMPHETMU NONE DETECTED 10/31/2014 2127   THCU NONE DETECTED 10/31/2014 2127   LABBARB NONE DETECTED 10/31/2014 2127    Alcohol Level:  Recent Labs Lab 10/31/14 1858  ETH 6*   Urinalysis:  Recent Labs Lab 10/31/14 2127  COLORURINE ORANGE*  LABSPEC 1.018  PHURINE 5.0  GLUCOSEU NEGATIVE  HGBUR NEGATIVE  BILIRUBINUR SMALL*  KETONESUR 15*  PROTEINUR 30*  UROBILINOGEN 1.0  NITRITE NEGATIVE  LEUKOCYTESUR SMALL*   Micro Results: Recent Results (from the past 240 hour(s))  Culture, body fluid-bottle     Status: None (Preliminary result)   Collection Time: 11/01/14  3:23 PM  Result Value Ref Range Status   Specimen Description FLUID ASCITIC  Final   Special Requests NONE  Final   Culture NO GROWTH 2 DAYS  Final   Report Status PENDING  Incomplete  Gram stain     Status: None   Collection Time: 11/01/14  3:23 PM  Result Value Ref Range Status   Specimen Description FLUID ASCITIC  Final   Special Requests NONE  Final   Gram Stain   Final    ABUNDANT WBC PRESENT,BOTH PMN AND MONONUCLEAR NO ORGANISMS SEEN    Report Status 11/01/2014 FINAL  Final   Studies/Results: No results found. Medications: I have reviewed the patient's current medications. Scheduled Meds: . carvedilol  6.25 mg Oral BID WC  . folic acid  1 mg Oral Daily  . [START ON 11/05/2014] furosemide  120 mg Intravenous Daily  . lactulose  30 g Oral BID  . spironolactone  300 mg Oral Daily  . thiamine  100 mg Oral Daily   Continuous Infusions:  PRN Meds:.albuterol, phenol-menthol, promethazine, sodium chloride Assessment/Plan: Principal Problem:   Alcoholic cirrhosis of liver with ascites Active Problems:   Essential hypertension, benign    Polysubstance abuse   Abnormal LFTs   Acute on chronic renal failure   Fluid overload   Hepatitis C   Ascites of liver  1. Decompensated HCV/Alcoholic Cirrhosis: With Ascities.  SOB has improved since paracentesis. So far not having good response to diuretics, but with improving Cr. BP also elevated. Pt net neg- 538mls since admission, out 1414mls later, but in- 1.5L. - Will give present daily dose of lasix once daily- 120mg  once - Cont Spironolactone at 300mg  daily, likely increase dose tomorrow if no response - Will start Coreg- 6.125mg  BID - D/c CIWA - Phenergan 12.5 mg q6hours PRN nausea - Folate/Thiamine - BMEt am- follow K, Cr - CBC 11/05/2014, appears stable at 45-48. - Will avoid hepatotoxics - like tylenol - Fluid restriction- 12100mls - Salt restriction in diet - Strict in and out - Daily weights  2. Acute on CKD IV: Likely pre-renal in the setting of volume overload. On lasix and Spironolactone. - Lasix as above - Spironolactone as above. - Check K, Cr am  3. HFpEF: EF 60-65% (12/9355) Grade 1 diastolic dysfunction. - Lasix as above - Start Coreg 6.125mg  daily  4. Hx of metabolic Encepahlopthy: Hx of.  - Will cont Lactulose for now, at reduced dose- 30mg  BID   5. Polysubstance Abuse: Patient has history of alcohol abuse with current cocaine use. Patient counseled to abstain from alcohol, cocaine, and other illicit substances. - Social work consult.  6. Thrombocytopenia/Anemia: Hgb stable 8 today.  Likely 2/2 longstanding alcohol abuse, anemia of chronic illness- liver, and GI blood loss. Anemia Panel- 5/1- ferritin- 304( setting of liver dx), serum fe- low at 32, B12 and folate normal. GI bleed previous admission, requiring blood transfusion.  - SCDs - Hold anticoag for now, INR elevated. - Fe tabs at - 365mg  daily  7. 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  8. NAsal Congestion- Nasal saline spray.   9. HTN- Lasix, Spironolactone  and Coreg as above.  FEN/GI -On dysphagia 3 diet- per Swallow eval in am, revaluate, patient can probably take a regular consistency diet. - Salt restriction and Fluid restriction.  Dispo: Disposition is deferred at this time, awaiting improvement of current medical problems.     LOS: 4 days   Bethena Roys, MD 11/04/2014, 12:45 PM

## 2014-11-04 NOTE — Progress Notes (Signed)
Physical Therapy Treatment Patient Details Name: Wlliam Grosso MRN: [id number] DOB: 11-Jun-1955 Today's Date: 11/19/2014    History of Present Illness 59 y.o. male with a history of alcoholism, cirrhosis, coagulopathy, and polysubstance abuse, HTN, chronic back pain admitted with fluid overload and SOB    PT Comments    Pt with continued increase in activity tolerance, balance and transfers. Pt with family assist to check on him periodically and feel pt would be able to manage at home with HHPT and RW. Pt encouraged for walking program and continued HEP at home. Will continue to follow acutely.   Follow Up Recommendations  Home health PT;Supervision - Intermittent     Equipment Recommendations  Rolling walker with 5" wheels    Recommendations for Other Services       Precautions / Restrictions Precautions Precautions: Fall Restrictions Weight Bearing Restrictions: No    Mobility  Bed Mobility               General bed mobility comments: in chair on arrival  Transfers Overall transfer level: Modified independent                  Ambulation/Gait Ambulation/Gait assistance: Supervision Ambulation Distance (Feet): 600 Feet Assistive device: Rolling walker (2 wheeled) Gait Pattern/deviations: Step-through pattern;Decreased stride length   Gait velocity interpretation: Below normal speed for age/gender General Gait Details: cues for posture and position in rW   Stairs            Wheelchair Mobility    Modified Rankin (Stroke Patients Only)       Balance                                    Cognition Arousal/Alertness: Awake/alert Behavior During Therapy: WFL for tasks assessed/performed Overall Cognitive Status: Within Functional Limits for tasks assessed                      Exercises General Exercises - Lower Extremity Long Arc Quad: AROM;Seated;Both;20 reps Hip ABduction/ADduction: AROM;Seated;Both;20 reps Hip  Flexion/Marching: AROM;Seated;Both;20 reps Toe Raises: AROM;Seated;Both;20 reps Heel Raises: AROM;Seated;Both;20 reps    General Comments        Pertinent Vitals/Pain Pain Assessment: No/denies pain    Home Living                      Prior Function            PT Goals (current goals can now be found in the care plan section) Progress towards PT goals: Progressing toward goals    Frequency       PT Plan Discharge plan needs to be updated    Co-evaluation             End of Session   Activity Tolerance: Patient tolerated treatment well Patient left: in chair;with call bell/phone within reach;with chair alarm set     Time: 4098-1191 PT Time Calculation (min) (ACUTE ONLY): 28 min  Charges:  $Gait Training: 8-22 mins $Therapeutic Exercise: 8-22 mins                    G Codes:      Melford Aase November 19, 2014, 9:31 AM Elwyn Reach, [place]

## 2014-11-04 NOTE — Care Management Note (Signed)
Case Management Note  Patient Details  Name: John [name] MRN: [id number] Date of Birth: 04/12/1956  Action/Plan: Talked to patient about DCP; patient plans to return home at discharge; [facility] choices offered, patient chose [facility]; Sheppard Evens with [facility] called for arrangments. Rolling walker ordered also.  Expected Discharge Date:  11/04/14               Expected Discharge Plan:  [place]  Discharge planning Services  CM Consult   Choice offered to:  Patient  DME Arranged:  Walker rolling DME Agency:  [place] Arranged:  RN, PT [facility] Agency:  [place]  Status of Service:  In process, will continue to follow  Sherrilyn Rist [phone] 11/04/2014, 2:31 PM

## 2014-11-05 LAB — BASIC METABOLIC PANEL
ANION GAP: 5 (ref 5–15)
BUN: 21 mg/dL — ABNORMAL HIGH (ref 6–20)
CHLORIDE: 107 mmol/L (ref 101–111)
CO2: 25 mmol/L (ref 22–32)
Calcium: 8 mg/dL — ABNORMAL LOW (ref 8.9–10.3)
Creatinine, Ser: 2.17 mg/dL — ABNORMAL HIGH (ref 0.61–1.24)
GFR calc Af Amer: 37 mL/min — ABNORMAL LOW (ref >60)
GFR calc non Af Amer: 32 mL/min — ABNORMAL LOW (ref >60)
GLUCOSE: 91 mg/dL (ref 65–99)
POTASSIUM: 4.3 mmol/L (ref 3.5–5.1)
SODIUM: 137 mmol/L (ref 135–145)

## 2014-11-05 MED ORDER — FLUTICASONE PROPIONATE 50 MCG/ACT NA SUSP
1.0000 | Freq: Every day | NASAL | Status: DC
  Administered 2014-11-05 – 2014-11-08 (×4): 1 via NASAL
  Filled 2014-11-05: qty 16

## 2014-11-05 MED ORDER — FUROSEMIDE 10 MG/ML IJ SOLN
60.0000 mg | Freq: Every evening | INTRAMUSCULAR | Status: DC
  Administered 2014-11-05 – 2014-11-06 (×2): 60 mg via INTRAVENOUS
  Filled 2014-11-05 (×3): qty 6

## 2014-11-05 NOTE — Progress Notes (Signed)
Subjective: Pt has some congestion in his nose today. Says the nasal saline spray did not help. Bleeding from nose has mostly stopped. Breathing is still okay, except for nasal congestion. And also says the site of drainage of ascitic fluid id still draining a little.   Objective: Vital signs in last 24 hours: Filed Vitals:   11/04/14 1455 11/04/14 1705 11/04/14 2100 11/05/14 0618  BP: 164/84 188/104 159/83 143/75  Pulse: 80 80 71 76  Temp: 98.2 F (36.8 C)  98.2 F (36.8 C) 97.5 F (36.4 C)  TempSrc: Oral  Oral Oral  Resp: 18  18 18   Height:      Weight:    259 lb (117.482 kg)  SpO2: 97%  95% 94%   Weight change: 27 lb 14.4 oz (12.655 kg)  Intake/Output Summary (Last 24 hours) at 11/05/14 0924 Last data filed at 11/05/14 0214  Gross per 24 hour  Intake   1076 ml  Output   1450 ml  Net   -374 ml   General appearance: lying in bed, in no distress Head: Normocephalic, without obvious abnormality, atraumatic Lungs: clear to auscultation bilaterally Heart: regular rate and rhythm, S1, S2 normal, no murmur, click, rub or gallop Abdomen: soft, non-tender, distended, bowel sounds present. Extremities: trace pitting bilat, and equal, feet dry  Lab Results: Basic Metabolic Panel:  Recent Labs Lab 11/04/14 0433 11/05/14 0314  NA 139 137  K 4.3 4.3  CL 110 107  CO2 24 25  GLUCOSE 90 91  BUN 23* 21*  CREATININE 2.34* 2.17*  CALCIUM 8.2* 8.0*   Liver Function Tests:  Recent Labs Lab 10/31/14 1856 11/01/14 0522  AST 71* 58*  ALT 22 20  ALKPHOS 67 63  BILITOT 2.4* 2.3*  PROT 8.7* 8.0  ALBUMIN 2.3* 2.1*    Recent Labs Lab 10/31/14 1856  AMMONIA 48*   CBC:  Recent Labs Lab 10/31/14 1856  11/03/14 0402 11/04/14 0433  WBC 8.5  < > 6.2 5.7  NEUTROABS 5.5  --   --   --   HGB 8.1*  < > 7.6* 8.0*  HCT 24.0*  < > 22.7* 23.7*  MCV 78.2  < > 80.8 81.2  PLT 120*  < > 45* 48*  < > = values in this interval not displayed.  Recent Labs Lab 10/31/14 2338  11/01/14 0522  LABPROT 18.3* 19.0*  INR 1.51* 1.60*   Urine Drug Screen: Drugs of Abuse     Component Value Date/Time   LABOPIA NONE DETECTED 10/31/2014 2127   COCAINSCRNUR POSITIVE* 10/31/2014 2127   LABBENZ NONE DETECTED 10/31/2014 2127   AMPHETMU NONE DETECTED 10/31/2014 2127   THCU NONE DETECTED 10/31/2014 2127   LABBARB NONE DETECTED 10/31/2014 2127    Alcohol Level:  Recent Labs Lab 10/31/14 1858  ETH 6*   Urinalysis:  Recent Labs Lab 10/31/14 2127  COLORURINE ORANGE*  LABSPEC 1.018  PHURINE 5.0  GLUCOSEU NEGATIVE  HGBUR NEGATIVE  BILIRUBINUR SMALL*  KETONESUR 15*  PROTEINUR 30*  UROBILINOGEN 1.0  NITRITE NEGATIVE  LEUKOCYTESUR SMALL*   Micro Results: Recent Results (from the past 240 hour(s))  Culture, body fluid-bottle     Status: None (Preliminary result)   Collection Time: 11/01/14  3:23 PM  Result Value Ref Range Status   Specimen Description FLUID ASCITIC  Final   Special Requests NONE  Final   Culture NO GROWTH 3 DAYS  Final   Report Status PENDING  Incomplete  Gram stain  Status: None   Collection Time: 11/01/14  3:23 PM  Result Value Ref Range Status   Specimen Description FLUID ASCITIC  Final   Special Requests NONE  Final   Gram Stain   Final    ABUNDANT WBC PRESENT,BOTH PMN AND MONONUCLEAR NO ORGANISMS SEEN    Report Status 11/01/2014 FINAL  Final   Studies/Results: No results found. Medications: I have reviewed the patient's current medications. Scheduled Meds: . carvedilol  6.25 mg Oral BID WC  . ferrous sulfate  325 mg Oral Daily  . fluticasone  1 spray Each Nare Daily  . folic acid  1 mg Oral Daily  . furosemide  120 mg Intravenous Daily  . furosemide  60 mg Intravenous QPM  . lactulose  30 g Oral BID  . spironolactone  300 mg Oral Daily  . thiamine  100 mg Oral Daily   Continuous Infusions:  PRN Meds:.albuterol, phenol-menthol, promethazine Assessment/Plan: Principal Problem:   Alcoholic cirrhosis of liver with  ascites Active Problems:   Essential hypertension, benign   Polysubstance abuse   Abnormal LFTs   Acute on chronic renal failure   Fluid overload   Hepatitis C   Ascites of liver  1. Decompensated HCV/Alcoholic Cirrhosis: With Ascities. No SOB. Has had one paracentesis- 11/01/2014. On lasix and spironolactone, urine still . So far not having good response to diuretics, but Cr continuing to improve. BP also elevated. Pt net neg- 93mls since admission, yesterday out 1.5L, but in- 1.L.  - cont lasix 120mg  in am , but add IV lasix 60mg  PM - Cont Spironolactone at 300mg  daily, increase dose tomorrow if no response - Cont Coreg- 6.125mg  BID - D/c CIWA - Phenergan 12.5 mg q6hours PRN nausea - Folate/Thiamine - BMEt am- follow K, Cr - Check CBC 11/05/2014, plt appears stable at 45-48. - Will avoid hepatotoxics - like tylenol - Fluid restriction- 1219mls - Salt restriction in diet - Strict in and out - Daily weights- weight today- 259, says his normal weight is about 230s-240s.  2. Acute on CKD IV: Likely pre-renal in the setting of volume overload. On lasix and Spironolactone. Cr improving. - Lasix as above - Spironolactone as above. - Check K, Cr am  3. HFpEF: EF 60-65% (0/1749) Grade 1 diastolic dysfunction. - IV Lasix as above 120mg  Am, 60 pm - Cont Coreg 6.125mg  daily  4. Hx of metabolic Encepahlopthy: Hx of.  - Will cont Lactulose for now, at reduced dose- 30mg  BID   5. Polysubstance Abuse: Patient has history of alcohol abuse with current cocaine use. Patient counseled to abstain from alcohol, cocaine, and other illicit substances. - Social work consult.  6. Thrombocytopenia/Anemia: Hgb stable so far. Likely 2/2 longstanding alcohol abuse, anemia of chronic illness- liver, and GI blood loss. Anemia Panel- 5/1- ferritin- 304( setting of liver dx), serum fe- low at 32, B12 and folate normal. GI bleed previous admission, requiring blood transfusion.  - SCDs - Hold anticoag for  now, INR elevated. - Fe tabs at - 365mg  daily  7. 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  8. NAsal Congestion- Nasal saline spray-d/c.  - Flonase for one day, if nasal bleed cont d/c   9. HTN- Lasix, Spironolactone and Coreg as above.  FEN/GI -On dysphagia 3 diet- per Swallow eval in am, revaluate, patient can probably take a regular consistency diet. - Salt restriction and Fluid restriction.  Dispo: Disposition is deferred at this time, awaiting improvement of current medical problems.     LOS: 5  days   Bethena Roys, MD 11/05/2014, 9:24 AM

## 2014-11-05 NOTE — Progress Notes (Signed)
Patient alert oriented, denies pain,  c/o stuffy, patient on saline nasal spray. OOB to the chair for breakfast. Will continue to monitor patient.

## 2014-11-06 LAB — CBC
HCT: 26.2 % — ABNORMAL LOW (ref 39.0–52.0)
HEMOGLOBIN: 8.7 g/dL — AB (ref 13.0–17.0)
MCH: 26.6 pg (ref 26.0–34.0)
MCHC: 33.2 g/dL (ref 30.0–36.0)
MCV: 80.1 fL (ref 78.0–100.0)
Platelets: 51 10*3/uL — ABNORMAL LOW (ref 150–400)
RBC: 3.27 MIL/uL — ABNORMAL LOW (ref 4.22–5.81)
RDW: 16.7 % — ABNORMAL HIGH (ref 11.5–15.5)
WBC: 5.3 10*3/uL (ref 4.0–10.5)

## 2014-11-06 LAB — BASIC METABOLIC PANEL
Anion gap: 5 (ref 5–15)
BUN: 22 mg/dL — AB (ref 6–20)
CALCIUM: 8 mg/dL — AB (ref 8.9–10.3)
CHLORIDE: 106 mmol/L (ref 101–111)
CO2: 25 mmol/L (ref 22–32)
Creatinine, Ser: 2.02 mg/dL — ABNORMAL HIGH (ref 0.61–1.24)
GFR calc Af Amer: 40 mL/min — ABNORMAL LOW (ref >60)
GFR calc non Af Amer: 35 mL/min — ABNORMAL LOW (ref >60)
Glucose, Bld: 93 mg/dL (ref 65–99)
Potassium: 4.2 mmol/L (ref 3.5–5.1)
Sodium: 136 mmol/L (ref 135–145)

## 2014-11-06 LAB — CULTURE, BODY FLUID-BOTTLE: CULTURE: NO GROWTH

## 2014-11-06 LAB — CULTURE, BODY FLUID W GRAM STAIN -BOTTLE

## 2014-11-06 LAB — PROTIME-INR
INR: 1.56 — AB (ref 0.00–1.49)
Prothrombin Time: 18.7 seconds — ABNORMAL HIGH (ref 11.6–15.2)

## 2014-11-06 MED ORDER — SPIRONOLACTONE 100 MG PO TABS
400.0000 mg | ORAL_TABLET | Freq: Every day | ORAL | Status: DC
  Administered 2014-11-06 – 2014-11-08 (×3): 400 mg via ORAL
  Filled 2014-11-06 (×4): qty 4

## 2014-11-06 MED ORDER — LACTULOSE 10 GM/15ML PO SOLN
30.0000 g | Freq: Two times a day (BID) | ORAL | Status: DC
  Administered 2014-11-06 – 2014-11-08 (×4): 30 g via ORAL
  Filled 2014-11-06 (×5): qty 45

## 2014-11-06 NOTE — Progress Notes (Signed)
Subjective: Patient continues to have nasal congestion. Has been using the nasal spray and Flonase which have helped some. Has had a few nosebleeds on the past 24 hours. Otherwise, no complaints with breathing. Dressing changed over paracentesis site yesterday. Patient states he has not noticed any additional leakage.   Objective: Vital signs in last 24 hours: Filed Vitals:   11/05/14 2057 11/05/14 2131 11/06/14 0508 11/06/14 0519  BP: 175/113 164/82 138/82   Pulse: 68  69   Temp: 97.8 F (36.6 C)  98.1 F (36.7 C)   TempSrc: Oral  Oral   Resp: 20  19   Height:      Weight:   103.375 kg (227 lb 14.4 oz) 117.527 kg (259 lb 1.6 oz)  SpO2: 93%  94%     Intake/Output Summary (Last 24 hours) at 11/06/14 0715 Last data filed at 11/06/14 2025  Gross per 24 hour  Intake    780 ml  Output   1603 ml  Net   -823 ml  Net I/O since admission: -1869mL  Last standing weights:  10/31/2014: 230 lb 11/03/2014: 233 lb 8 oz 11/04/2014: 231 lb 1.6 oz 11/06/2014:  227 lb 14 oz  General Appearance: Well-developed, well-nourished, conversant and in NAD Head: Normocephalic,atraumatic Nose: Crusting at nares Eyes: Scleral icterus present Lungs: No increased work of breathing on RA; CTAB; no distress Heart: Regular rate and rhythm, S1 and S2 normal, no murmur, rub or gallop Abdomen:Appears slightly less distended compared to 11/04/14, tender at sites of prior paracentesis drainiage; BS+; anasarca appears mildly improved from 11/04/14 Extremities: Tracel bilateral pitting edema of the lower extremities, slightly improved today; 2+ DP pulses bilaterally Skin: Skin is warm and dry with some pretibial skin scaling bilaterally  Labs: Basic Metabolic Panel:  Recent Labs Lab 11/05/14 0314 11/06/14 0320  NA 137 136  K 4.3 4.2  CL 107 106  CO2 25 25  GLUCOSE 91 93  BUN 21* 22*  CREATININE 2.17* 2.02*  CALCIUM 8.0* 8.0*   Liver Function Tests:  Recent Labs Lab 10/31/14 1856  11/01/14 0522  AST 71* 58*  ALT 22 20  ALKPHOS 67 63  BILITOT 2.4* 2.3*  PROT 8.7* 8.0  ALBUMIN 2.3* 2.1*    Recent Labs Lab 10/31/14 1856  AMMONIA 48*   CBC:  Recent Labs Lab 10/31/14 1856  11/03/14 0402 11/04/14 0433  WBC 8.5  < > 6.2 5.7  NEUTROABS 5.5  --   --   --   HGB 8.1*  < > 7.6* 8.0*  HCT 24.0*  < > 22.7* 23.7*  MCV 78.2  < > 80.8 81.2  PLT 120*  < > 45* 48*  < > = values in this interval not displayed.   Recent Labs Lab 10/31/14 2338 11/01/14 0522 11/06/14 0320  LABPROT 18.3* 19.0* 18.7*  INR 1.51* 1.60* 1.56*   Urine Drug Screen: Drugs of Abuse     Component Value Date/Time   LABOPIA NONE DETECTED 10/31/2014 2127   COCAINSCRNUR POSITIVE* 10/31/2014 2127   LABBENZ NONE DETECTED 10/31/2014 2127   AMPHETMU NONE DETECTED 10/31/2014 2127   THCU NONE DETECTED 10/31/2014 2127   LABBARB NONE DETECTED 10/31/2014 2127    Alcohol Level:  Recent Labs Lab 10/31/14 1858  ETH 6*   Urinalysis:  Recent Labs Lab 10/31/14 2127  COLORURINE ORANGE*  LABSPEC 1.018  PHURINE 5.0  GLUCOSEU NEGATIVE  HGBUR NEGATIVE  BILIRUBINUR SMALL*  KETONESUR 15*  PROTEINUR 30*  UROBILINOGEN 1.0  NITRITE NEGATIVE  LEUKOCYTESUR  SMALL*    Medications:  Scheduled Medications: . carvedilol  6.25 mg Oral BID WC  . ferrous sulfate  325 mg Oral Daily  . fluticasone  1 spray Each Nare Daily  . folic acid  1 mg Oral Daily  . furosemide  120 mg Intravenous Daily  . furosemide  60 mg Intravenous QPM  . lactulose  30 g Oral BID  . spironolactone  300 mg Oral Daily  . thiamine  100 mg Oral Daily    PRN Medications: albuterol, phenol-menthol, promethazine    Assessment/Plan: Principal Problem:   Alcoholic cirrhosis of liver with ascites Active Problems:   Essential hypertension, benign   Polysubstance abuse   Abnormal LFTs   Acute on chronic renal failure   Fluid overload   Hepatitis C   Ascites of liver  Mr. Asper is a 59 yo male with a history of  alcoholic and HCV cirrhosis complicated by portal hypertension and ascites, polysubstance abuse (EtOH and cocaine), CHF, and hypertension who presented to [facility] on 10/31/14 with a 3 day history of worsening shortness of breath, consistent with decompensated cirrhosis after not taking any of his diuretics or other medications for the past month. Patient is now s/p 5.3L paracenteses, but remains volume overloaded with significant ascites and anasarca. We are continuing to diuresis him with spironolactone and Lasix per below.  #Total body volume overload secondary to decompensated HCV/Alcoholic Cirrhosis: Anasarca and lower extremity edema continues to improve; however, patient remains significantly distended. Standing weight: down 4 lbs from 11/04/14. Net I/O = -1844mL (down -871mL from yesterday).  - Continue Lasix: 120mg  IV QD am + 60mg  IV QD pm - Increase spironolactone at 400mg  daily - Continue to monitor his K, Cr, and anasarca/abdominal distension - Continue lactulose to 30g BID - Creatine continues to improve: 3.34 -> 3.06 -> 2.57 -> 2.34 -> 2.02; K remains stable; repeat BMP tomorrow am - Consider repeat paracentesis in the next few days to expedite fluid loss; last LVP performed by Interventional Radiology on 11/01/14 pm - Continue to emphasize the importance of medication compliance and follow-up with PCP and gastroenterologist - Continue salt restricted diet - Continue strict I/Os - Continue daily weights  #Hypertension: Patient has had systolic BPs in the 542H to 170s over past 24 hours.  - Continue Coreg (carvedilol) 6.25 BID with meals - Spironolactone and Lasix per above.  #Thrombocytopenia: Likely 2/2 longstanding alcohol abuse, liver disease, and anemia of chronic disease. Hemoglobin has been stable. Platelets:: 120 -> 88 -> 61 -> 45 -> 48. Repeat CBC today pending. - INR stable at 1.60 -> 1.56 (11/06/14); continue SCDs [ ]  follow-up CBC today  #Anemia: Patient had iron studies  performed on 08/28/2014: serum Fe = 32, TIBC = 237, Fe saturation = 14%, ferritin = 304. B12/folate normal.  - Continue oral Fe supplementation 365mg  qd  #Acute on CKD IV: Creatinine improving per above.  - Lasix and spironolactone as above - Trend creatinine; K per above  #Nasal congestion/sore throat. Improved today. - Continue Flonase daily - Continue PRN throat lozenges   #Polysubstance Abuse: Patient has history of alcohol abuse with current cocaine use. Patient counseled to abstain from alcohol, cocaine, and other illicit substances. - Social work consult - Continue Phenergan 12.5 mg q6hours PRN nausea - Continue Folate/Thiamine  #HFpEF: EF 60-65% (0/6237) Grade 1 diastolic dysfunction.  - Continue Lasix as above  #FEN/GI/PPx -Diet = Dysphagia 3 and salt restriction. -Fluids = Restrict -Electrolytes = CTM -DVT = SCDs  Code  Status -Full code   Disposition -Disposition is deferred at this time, awaiting improvement of improvement of current medical problems.  -Anticipated discharge in approximately 2 -3 day(s).  -The patient does not have a current PCP (No PCP Per Patient) and does need an [facility] hospital follow-up appointment after discharge.    Length of Stay: 6 day(s)  This is a Careers information officer Note.  The care of the patient was discussed with Dr. Denton Brick and the assessment and plan formulated with their assistance.  Please see their attached note for official documentation of the daily encounter.   LOS: 6 days   Quita Skye, Med Student 11/06/2014, 7:15 AM

## 2014-11-06 NOTE — Progress Notes (Signed)
Subjective: Sitting in recliner at bedside. Feels better today. Has tried nasal spray again with some improvement.   Objective: Vital signs in last 24 hours: Filed Vitals:   11/05/14 2057 11/05/14 2131 11/06/14 0508 11/06/14 0519  BP: 175/113 164/82 138/82   Pulse: 68  69   Temp: 97.8 F (36.6 C)  98.1 F (36.7 C)   TempSrc: Oral  Oral   Resp: 20  19   Height:      Weight:   227 lb 14.4 oz (103.375 kg) 259 lb 1.6 oz (117.527 kg)  SpO2: 93%  94%    Weight change: -31 lb 1.6 oz (-14.107 kg)  Intake/Output Summary (Last 24 hours) at 11/06/14 0943 Last data filed at 11/06/14 0800  Gross per 24 hour  Intake    780 ml  Output   1703 ml  Net   -923 ml   General appearance: Alert and cooperative.  Head: Normocephalic, without obvious abnormality, atraumatic Lungs: clear to auscultation bilaterally Heart: regular rate and rhythm, S1, S2 normal, no murmur, click, rub or gallop Abdomen: soft, non-tender, dry dressing at site of prior paracentesis, distended, bowel sounds present. Extremities: trace pitting bilat, and equal, feet dry  Lab Results: Basic Metabolic Panel:  Recent Labs Lab 11/05/14 0314 11/06/14 0320  NA 137 136  K 4.3 4.2  CL 107 106  CO2 25 25  GLUCOSE 91 93  BUN 21* 22*  CREATININE 2.17* 2.02*  CALCIUM 8.0* 8.0*   Liver Function Tests:  Recent Labs Lab 10/31/14 1856 11/01/14 0522  AST 71* 58*  ALT 22 20  ALKPHOS 67 63  BILITOT 2.4* 2.3*  PROT 8.7* 8.0  ALBUMIN 2.3* 2.1*    Recent Labs Lab 10/31/14 1856  AMMONIA 48*   CBC:  Recent Labs Lab 10/31/14 1856  11/03/14 0402 11/04/14 0433  WBC 8.5  < > 6.2 5.7  NEUTROABS 5.5  --   --   --   HGB 8.1*  < > 7.6* 8.0*  HCT 24.0*  < > 22.7* 23.7*  MCV 78.2  < > 80.8 81.2  PLT 120*  < > 45* 48*  < > = values in this interval not displayed.  Recent Labs Lab 10/31/14 2338 11/01/14 0522 11/06/14 0320  LABPROT 18.3* 19.0* 18.7*  INR 1.51* 1.60* 1.56*   Urine Drug Screen: Drugs of  Abuse     Component Value Date/Time   LABOPIA NONE DETECTED 10/31/2014 2127   COCAINSCRNUR POSITIVE* 10/31/2014 2127   LABBENZ NONE DETECTED 10/31/2014 2127   AMPHETMU NONE DETECTED 10/31/2014 2127   THCU NONE DETECTED 10/31/2014 2127   LABBARB NONE DETECTED 10/31/2014 2127    Alcohol Level:  Recent Labs Lab 10/31/14 1858  ETH 6*   Urinalysis:  Recent Labs Lab 10/31/14 2127  COLORURINE ORANGE*  LABSPEC 1.018  PHURINE 5.0  GLUCOSEU NEGATIVE  HGBUR NEGATIVE  BILIRUBINUR SMALL*  KETONESUR 15*  PROTEINUR 30*  UROBILINOGEN 1.0  NITRITE NEGATIVE  LEUKOCYTESUR SMALL*   Micro Results: Recent Results (from the past 240 hour(s))  Culture, body fluid-bottle     Status: None (Preliminary result)   Collection Time: 11/01/14  3:23 PM  Result Value Ref Range Status   Specimen Description FLUID ASCITIC  Final   Special Requests NONE  Final   Culture NO GROWTH 4 DAYS  Final   Report Status PENDING  Incomplete  Gram stain     Status: None   Collection Time: 11/01/14  3:23 PM  Result Value Ref Range  Status   Specimen Description FLUID ASCITIC  Final   Special Requests NONE  Final   Gram Stain   Final    ABUNDANT WBC PRESENT,BOTH PMN AND MONONUCLEAR NO ORGANISMS SEEN    Report Status 11/01/2014 FINAL  Final   Medications: I have reviewed the patient's current medications. Scheduled Meds: . carvedilol  6.25 mg Oral BID WC  . ferrous sulfate  325 mg Oral Daily  . fluticasone  1 spray Each Nare Daily  . folic acid  1 mg Oral Daily  . furosemide  120 mg Intravenous Daily  . furosemide  60 mg Intravenous QPM  . lactulose  30 g Oral BID  . spironolactone  400 mg Oral Daily  . thiamine  100 mg Oral Daily   Continuous Infusions:  PRN Meds:.albuterol, phenol-menthol, promethazine Assessment/Plan: Principal Problem:   Alcoholic cirrhosis of liver with ascites Active Problems:   Essential hypertension, benign   Polysubstance abuse   Abnormal LFTs   Acute on chronic renal  failure   Fluid overload   Hepatitis C   Ascites of liver  1. Decompensated HCV/Alcoholic Cirrhosis: Stable. With Ascities. Now with some improvement in urine output. Pt net neg- 1.8L since admission. - cont IV lasix 120mg  in am and lasix 60mg  PM - increase Spironolactone to  Max dose of 400mg  daily - Cont Coreg- 6.125mg  BID - Folate/Thiamine - BMEt am- follow K, Cr - Check CBC 11/06/2014, plt appeared stable at 45-48. - Will avoid hepatotoxics - like tylenol - Fluid restriction- 127mls - Salt restriction in diet - Strict in and out - Daily weights- weight today- 259, says his normal weight is about 230s-240s.  2. Acute on CKD IV: Improving On IV lasix and Spironolactone. Cr 2.02 today. Will back off on diuretics when Cr or Bicarb starts to increase - Lasix as above - Spironolactone as above. - Check K, Cr am  3. HFpEF: EF 60-65% (06/6642) Grade 1 diastolic dysfunction. - IV Lasix as above 120mg  Am, 60 pm - Cont Coreg 6.125mg  daily  4. Hx of metabolic Encepahlopthy: Hx of.  - Will cont Lactulose for now, at reduced dose- 30mg  BID   5. Polysubstance Abuse: Patient has history of alcohol abuse with current cocaine use. Patient counseled to abstain from alcohol, cocaine, and other illicit substances. - Social work consult.  6. Thrombocytopenia/Anemia: Hgb stable so far. Likely 2/2 longstanding alcohol abuse, anemia of chronic illness- liver, and GI blood loss. 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 Anemia Panel- 5/1- ferritin- 304( setting of liver dx), serum fe- low at 32, B12 and folate normal. GI bleed previous admission, requiring blood transfusion.  - SCDs - Hold anticoag for now, INR elevated- 1.56 today - Cont Fe tabs at - 365mg  daily  7. 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  8. NAsal Congestion- Felt better today. Nasal saline spray-at bedside, will cont.  - Flonase for one day, if nasal bleed cont d/c    9. HTN- Lasix, Spironolactone and Coreg as above.  FEN/GI -On dysphagia 3 diet- per Swallow eval, revaluate, patient can probably take a regular consistency diet. - Salt restriction and Fluid restriction.  Dispo: Disposition is deferred at this time, awaiting improvement of current medical problems.     LOS: 6 days   John Roys, MD 11/06/2014, 9:43 AM

## 2014-11-07 ENCOUNTER — Ambulatory Visit: Payer: Medicaid Other | Admitting: Gastroenterology

## 2014-11-07 LAB — BASIC METABOLIC PANEL
ANION GAP: 6 (ref 5–15)
BUN: 21 mg/dL — ABNORMAL HIGH (ref 6–20)
CO2: 27 mmol/L (ref 22–32)
CREATININE: 2.26 mg/dL — AB (ref 0.61–1.24)
Calcium: 7.9 mg/dL — ABNORMAL LOW (ref 8.9–10.3)
Chloride: 105 mmol/L (ref 101–111)
GFR calc non Af Amer: 30 mL/min — ABNORMAL LOW (ref >60)
GFR, EST AFRICAN AMERICAN: 35 mL/min — AB (ref >60)
Glucose, Bld: 88 mg/dL (ref 65–99)
Potassium: 4.3 mmol/L (ref 3.5–5.1)
Sodium: 138 mmol/L (ref 135–145)

## 2014-11-07 MED ORDER — FUROSEMIDE 80 MG PO TABS
120.0000 mg | ORAL_TABLET | Freq: Every day | ORAL | Status: DC
  Administered 2014-11-07 – 2014-11-08 (×2): 120 mg via ORAL
  Filled 2014-11-07 (×2): qty 1

## 2014-11-07 NOTE — Progress Notes (Signed)
Subjective: Patient states that his nasal congestion is improved. No nose bleeds. He feels like his abdominal distension has not improved.  Objective: Vital signs in last 24 hours: Filed Vitals:   11/06/14 0519 11/06/14 1330 11/06/14 2236 11/07/14 0553  BP:  131/88 132/84 141/87  Pulse:  67 68 66  Temp:  98.7 F (37.1 C) 98.6 F (37 C) 98.5 F (36.9 C)  TempSrc:  Oral Oral Oral  Resp:  18 18 22   Height:      Weight: 117.527 kg (259 lb 1.6 oz)   104.237 kg (229 lb 12.8 oz)  SpO2:  99% 100% 100%   Weight change: 0.862 kg (1 lb 14.4 oz)  Intake/Output Summary (Last 24 hours) at 11/07/14 0654 Last data filed at 11/07/14 2202  Gross per 24 hour  Intake   1282 ml  Output   1100 ml  Net    182 ml    Last standing weights:  10/31/2014: 230 lb 11/03/2014: 233 lb 8 oz 11/04/2014: 231 lb 1.6 oz 11/06/2014: 227 lb 14 oz 11/06/2013: 229 lb 12.8oz  General Appearance: Well-developed, well-nourished, conversant and in NAD Head: Normocephalic,atraumatic Nose: Crusting at nares Eyes: Scleral icterus present Lungs: No increased work of breathing on RA; CTAB; no distress Heart: Regular rate and rhythm, S1 and S2 normal, no murmur, rub or gallop Abdomen:Appears slightly less distended compared to 11/04/14, tender at sites of prior paracentesis drainiage; BS+; anasarca appears mildly improved from 11/04/14 Extremities: Tracel bilateral pitting edema of the lower extremities, slightly improved today; 2+ DP pulses bilaterally Skin: Skin is warm and dry with some pretibial skin scaling bilaterally  Labs: Basic Metabolic Panel:  Recent Labs Lab 11/06/14 0320 11/07/14 0331  NA 136 138  K 4.2 4.3  CL 106 105  CO2 25 27  GLUCOSE 93 88  BUN 22* 21*  CREATININE 2.02* 2.26*  CALCIUM 8.0* 7.9*   Liver Function Tests:  Recent Labs Lab 10/31/14 1856 11/01/14 0522  AST 71* 58*  ALT 22 20  ALKPHOS 67 63  BILITOT 2.4* 2.3*  PROT 8.7* 8.0  ALBUMIN 2.3* 2.1*   No results for  input(s): LIPASE, AMYLASE in the last 168 hours.  Recent Labs Lab 10/31/14 1856  AMMONIA 48*   CBC:  Recent Labs Lab 10/31/14 1856  11/04/14 0433 11/06/14 1128  WBC 8.5  < > 5.7 5.3  NEUTROABS 5.5  --   --   --   HGB 8.1*  < > 8.0* 8.7*  HCT 24.0*  < > 23.7* 26.2*  MCV 78.2  < > 81.2 80.1  PLT 120*  < > 48* 51*  < > = values in this interval not displayed.  Coagulation:  Recent Labs Lab 10/31/14 2338 11/01/14 0522 11/06/14 0320  LABPROT 18.3* 19.0* 18.7*  INR 1.51* 1.60* 1.56*   Urine Drug Screen: Drugs of Abuse     Component Value Date/Time   LABOPIA NONE DETECTED 10/31/2014 2127   COCAINSCRNUR POSITIVE* 10/31/2014 2127   LABBENZ NONE DETECTED 10/31/2014 2127   AMPHETMU NONE DETECTED 10/31/2014 2127   THCU NONE DETECTED 10/31/2014 2127   LABBARB NONE DETECTED 10/31/2014 2127   Urinalysis:  Recent Labs Lab 10/31/14 2127  COLORURINE ORANGE*  LABSPEC 1.018  PHURINE 5.0  GLUCOSEU NEGATIVE  HGBUR NEGATIVE  BILIRUBINUR SMALL*  KETONESUR 15*  PROTEINUR 30*  UROBILINOGEN 1.0  NITRITE NEGATIVE  LEUKOCYTESUR SMALL*    Medications:  Scheduled Medications: . carvedilol  6.25 mg Oral BID WC  . ferrous sulfate  325 mg Oral Daily  . fluticasone  1 spray Each Nare Daily  . folic acid  1 mg Oral Daily  . furosemide  120 mg Intravenous Daily  . furosemide  60 mg Intravenous QPM  . lactulose  30 g Oral BID  . spironolactone  400 mg Oral Daily  . thiamine  100 mg Oral Daily    PRN Medications: albuterol, phenol-menthol    Assessment/Plan: Principal Problem:   Alcoholic cirrhosis of liver with ascites Active Problems:   Essential hypertension, benign   Polysubstance abuse   Abnormal LFTs   Acute on chronic renal failure   Fluid overload   Hepatitis C   Ascites of liver   John [name] is a 59 yo male with a history of alcoholic and HCV cirrhosis complicated by portal hypertension and ascites, polysubstance abuse (EtOH and cocaine), CHF, and  hypertension who [facility] with a 3 day history of worsening shortness of breath, consistent with decompensated cirrhosis after not taking any of his diuretics or other medications for the past month. Patient is now s/p 5.3L paracenteses, but remains volume overloaded with significant ascites and anasarca. We are continuing to diuresis him with spironolactone and Lasix per below.  #Total body volume overload secondary to decompensated HCV/Alcoholic Cirrhosis: Anasarca and lower extremity edema continues to improve; however, patient remains significantly distended. Standing weight: down only 4oz lbs from admission (10/31/14) and up 2 lbs from yesterday. Net I/O since admission = -1557mL (up 182 mL from yesterday). Plan to switch to PO lasix with expected discharge tomorrow with close follow up in the [facility] clinic. - Switch to oral Lasix: 120mg  by mouth daily  - Continue spironolactone at 400mg  daily - Continue to monitor his K, Cr, and anasarca/abdominal distension - Continue lactulose to 10g BID; consider dc'ing at dischage - Creatine: 3.34 -> 3.06 -> 2.57 -> 2.34 -> 2.02 -> 2.26; K remains stable; repeat BMP tomorrow am; given increase in creatinine, appears that we passed max dose he can tolerate - Continue to emphasize the importance of medication compliance and follow-up with PCP and gastroenterologist - Continue salt restricted diet - Continue strict I/Os - Continue daily weights  #Hypertension: Patient has had well-controlled BP over past 24 hours with systolics BPs in the 497W to 140s.  - Continue Coreg (carvedilol) 6.25 BID with meals - Spironolactone and Lasix per above.  #Thrombocytopenia: Likely 2/2 longstanding alcohol abuse, liver disease, and anemia of chronic disease. Hemoglobin has been stable. Platelets:: 120 -> 88 -> 61 -> 45 -> 48 -> 51. - INR stable at 1.60 -> 1.56 (11/06/14); continue SCDs  #Anemia: Patient had iron studies performed on 08/28/2014: serum Fe = 32,  TIBC = 237, Fe saturation = 14%, ferritin = 304. B12/folate normal.  - Continue oral Fe supplementation 365mg  qd  #Acute on CKD IV: Creatinine increased this morning per above.  - Lasix and spironolactone as above - Trend creatinine; K per above  #Nasal congestion/sore throat. Improved today. - Continue Flonase daily - Continue PRN throat lozenges   #Polysubstance Abuse: Patient has history of alcohol abuse with current cocaine use. Patient counseled to abstain from alcohol, cocaine, and other illicit substances. - Social work consult - Continue Phenergan 12.5 mg q6hours PRN nausea - Continue Folate/Thiamine  #HFpEF: EF 60-65% (05/6376) Grade 1 diastolic dysfunction.  - Continue Lasix as above  #FEN/GI/PPx -Diet = Dysphagia 3 and salt restriction. -Fluids = Restrict -Electrolytes = CTM -DVT = SCDs  Code Status -Full code  Disposition -Disposition is deferred at this time, awaiting improvement of improvement of current medical problems.  -Anticipated discharge in approximately 1 day(s) = tomorrow -The patient does not have a current PCP (No PCP Per Patient) and does need an [facility] hospital follow-up appointment after discharge.   SERVICE NEEDED AT [place]         Y = Yes, Blank = No PT:   OT:   RN:   Equipment:   Other:    Length of Stay: 7 day(s)  This is a Careers information officer Note.  The care of the patient was discussed with Dr. Denton Brick and the assessment and plan formulated with their assistance.  Please see their attached note for official documentation of the daily encounter.   LOS: 7 days   John [name], Med Student 11/07/2014, 6:54 AM

## 2014-11-07 NOTE — Progress Notes (Signed)
Subjective: No complainst today. Says he feels ok overall but his abdomen is unchanged, still distended but his breathing is comfortable.   Objective: Vital signs in last 24 hours: Filed Vitals:   11/06/14 1330 11/06/14 2236 11/07/14 0553 11/07/14 1004  BP: 131/88 132/84 141/87 128/76  Pulse: 67 68 66 66  Temp: 98.7 F (37.1 C) 98.6 F (37 C) 98.5 F (36.9 C) 97.7 F (36.5 C)  TempSrc: Oral Oral Oral Oral  Resp: 18 18 22 20   Height:      Weight:   229 lb 12.8 oz (104.237 kg)   SpO2: 99% 100% 100% 99%   Weight change: 1 lb 14.4 oz (0.862 kg)  Intake/Output Summary (Last 24 hours) at 11/07/14 1357 Last data filed at 11/07/14 1250  Gross per 24 hour  Intake    840 ml  Output   1100 ml  Net   -260 ml   General appearance: alert, cooperative, appears as stated age and mild distress Head: Normocephalic, without obvious abnormality, atraumatic Lungs: clear to auscultation bilaterally Heart: regular rate and rhythm, S1, S2 normal, no murmur, click, rub or gallop Abdomen: soft, non-tender; but distended, small dry dressing on side of abdomen, bowel sounds normal; Extremities: extremities normal, atraumatic, mild trace pitting edema . Pulses: 2+ and symmetric  Lab Results: Basic Metabolic Panel:  Recent Labs Lab 11/06/14 0320 11/07/14 0331  NA 136 138  K 4.2 4.3  CL 106 105  CO2 25 27  GLUCOSE 93 88  BUN 22* 21*  CREATININE 2.02* 2.26*  CALCIUM 8.0* 7.9*   Liver Function Tests:  Recent Labs Lab 10/31/14 1856 11/01/14 0522  AST 71* 58*  ALT 22 20  ALKPHOS 67 63  BILITOT 2.4* 2.3*  PROT 8.7* 8.0  ALBUMIN 2.3* 2.1*    Recent Labs Lab 10/31/14 1856  AMMONIA 48*   CBC:  Recent Labs Lab 10/31/14 1856  11/04/14 0433 11/06/14 1128  WBC 8.5  < > 5.7 5.3  NEUTROABS 5.5  --   --   --   HGB 8.1*  < > 8.0* 8.7*  HCT 24.0*  < > 23.7* 26.2*  MCV 78.2  < > 81.2 80.1  PLT 120*  < > 48* 51*  < > = values in this interval not  displayed.  Coagulation:  Recent Labs Lab 10/31/14 2338 11/01/14 0522 11/06/14 0320  LABPROT 18.3* 19.0* 18.7*  INR 1.51* 1.60* 1.56*   Urine Drug Screen: Drugs of Abuse     Component Value Date/Time   LABOPIA NONE DETECTED 10/31/2014 2127   COCAINSCRNUR POSITIVE* 10/31/2014 2127   LABBENZ NONE DETECTED 10/31/2014 2127   AMPHETMU NONE DETECTED 10/31/2014 2127   THCU NONE DETECTED 10/31/2014 2127   LABBARB NONE DETECTED 10/31/2014 2127    Alcohol Level:  Recent Labs Lab 10/31/14 1858  ETH 6*   Urinalysis:  Recent Labs Lab 10/31/14 2127  COLORURINE ORANGE*  LABSPEC 1.018  PHURINE 5.0  GLUCOSEU NEGATIVE  HGBUR NEGATIVE  BILIRUBINUR SMALL*  KETONESUR 15*  PROTEINUR 30*  UROBILINOGEN 1.0  NITRITE NEGATIVE  LEUKOCYTESUR SMALL*   Micro Results: Recent Results (from the past 240 hour(s))  Culture, body fluid-bottle     Status: None   Collection Time: 11/01/14  3:23 PM  Result Value Ref Range Status   Specimen Description FLUID ASCITIC  Final   Special Requests NONE  Final   Culture NO GROWTH 5 DAYS  Final   Report Status 11/06/2014 FINAL  Final  Gram stain  Status: None   Collection Time: 11/01/14  3:23 PM  Result Value Ref Range Status   Specimen Description FLUID ASCITIC  Final   Special Requests NONE  Final   Gram Stain   Final    ABUNDANT WBC PRESENT,BOTH PMN AND MONONUCLEAR NO ORGANISMS SEEN    Report Status 11/01/2014 FINAL  Final   Medications: I have reviewed the patient's current medications. Scheduled Meds: . carvedilol  6.25 mg Oral BID WC  . ferrous sulfate  325 mg Oral Daily  . fluticasone  1 spray Each Nare Daily  . folic acid  1 mg Oral Daily  . furosemide  120 mg Oral Daily  . lactulose  30 g Oral BID  . spironolactone  400 mg Oral Daily  . thiamine  100 mg Oral Daily   Continuous Infusions:  PRN Meds:.albuterol, phenol-menthol Assessment/Plan:  1. Decompensated HCV/Alcoholic Cirrhosis: Stable. With Ascities. No improvement  in weight despite high diuretic dose. Has had a sluggish response to IV lasix- 120 am and 60mg  pm, and now with slight increase in Cr. - Stop IV lasix Switch to Po lasix today, 120mg  daily. - Cont Spironolactone at 400mg  daily - Cont Coreg- 6.125mg  BID - Folate/Thiamine - BMEt am- follow K, Cr - Fluid restriction- 1272mls - Salt restriction in diet - Strict in and out - Daily weights - Plan for discharge tomorrow with close follow up by end of the week as out-pt- Check K, cr and Compliance. - Will Order Home health PT and OT   2. Acute on CKD IV: Slight increase in Cr today, 2.26 from 2.02. On IV lasix and Spironolactone. - Switch to PO lasix as above - Spironolactone as above. - Check K, Cr am  3. HFpEF: EF 60-65% (06/9530) Grade 1 diastolic dysfunction. - Lasix 120mg  daily PO - Cont Coreg 6.125mg  daily  4. Hx of metabolic Encepahlopthy: Hx of.  - Will cont Lactulose for now, at reduced dose- 30mg  BID   5. Polysubstance Abuse: Patient has history of alcohol abuse with current cocaine use. Patient counseled to abstain from alcohol, cocaine, and other illicit substances. - Social work consult.  6. Thrombocytopenia/Anemia: Hgb stable so far-  8.7  Platelets-51,  11/06/2014. - SCDs - Hold anticoag for now, INR elevated- 1.56 - Cont Fe tabs at - 365mg  daily  7. 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  8. NAsal Congestion- Felt better today. Nasal saline spray-at bedside, will cont.  - Flonase for one day, if nasal bleed cont d/c   9. HTN- Lasix, Spironolactone and Coreg as above.  FEN/GI -On dysphagia 3 diet- per Swallow eval - Salt restriction and Fluid restriction.  Dispo: Disposition is deferred at this time, awaiting improvement of current medical problems.  Anticipated discharge approximately- tomorrow.   The patient does have a current PCP (No Pcp Per Patient) and does need an [facility] hospital follow-up appointment after discharge.  The  patient does not know have transportation limitations that hinder transportation to clinic appointments.  .Services Needed at time of discharge: Y = Yes, Blank = No PT:   OT:   RN:   Equipment:   Other:     LOS: 7 days   Bethena Roys, MD 11/07/2014, 1:57 PM

## 2014-11-07 NOTE — Progress Notes (Signed)
Occupational Therapy Treatment and Discharge Patient Details Name: Amahd Morino MRN: [id number] DOB: 1955-06-06 Today's Date: 11/07/2014    History of present illness 59 y.o. male with a history of alcoholism, cirrhosis, coagulopathy, and polysubstance abuse, HTN, chronic back pain admitted with fluid overload and SOB   OT comments  Pt reports routinely taking himself to the bathroom.  Performed simulated tub transfer and LB bathing and dressing with modified independence using AE.  Pt agrees with tub seat recommendation.  No further acute OT needs.  Recommend HHOT to address IADL and home safety.  Follow Up Recommendations  Home health OT    Equipment Recommendations  Tub/shower seat    Recommendations for Other Services      Precautions / Restrictions Precautions Precautions: Fall Restrictions Weight Bearing Restrictions: No       Mobility Bed Mobility                 Transfers Overall transfer level: Modified independent                    Balance                                   ADL Overall ADL's : Needs assistance/impaired                                       General ADL Comments: Educated and issued LB AE, pt grateful.  Performed simulated tub transfer with modified independence.  Recommended tub seat, pt agreeable.  Has grab bars around toilet and tub at home.Educated pt in safe transport of items while using RW and in availabilty of elastic shoe laces.      Vision                     Perception     Praxis      Cognition   Behavior During Therapy: WFL for tasks assessed/performed Overall Cognitive Status: Within Functional Limits for tasks assessed                       Extremity/Trunk Assessment               Exercises    Shoulder Instructions       General Comments      Pertinent Vitals/ Pain       Pain Assessment: No/denies pain  Home Living                                           Prior Functioning/Environment              Frequency Min 2X/week     Progress Toward Goals  OT Goals(current goals can now be found in the care plan section)  Progress towards OT goals: Goals met/education completed, patient discharged from OT  Acute Rehab OT Goals Patient Stated Goal: return home  Plan Discharge plan needs to be updated    Co-evaluation                 End of Session     Activity Tolerance Patient tolerated treatment well   Patient Left in chair;with call bell/phone within reach   Nurse  Communication          Time: 6151-8343 OT Time Calculation (min): 19 min  Charges: OT General Charges $OT Visit: 1 Procedure OT Treatments $Self Care/Home Management : 8-22 mins  Malka So 11/07/2014, 2:04 PM  [phone]

## 2014-11-07 NOTE — Progress Notes (Signed)
Physical Therapy Treatment Patient Details Name: John [name] MRN: [id number] DOB: 09-Jan-1956 Today's Date: November 14, 2014    History of Present Illness 59 y.o. male with a history of alcoholism, cirrhosis, coagulopathy, and polysubstance abuse, HTN, chronic back pain admitted with fluid overload and SOB    PT Comments    Pt  Continues to progress well and states decreased abdominal edema continues to improve mobility. Pt able to walk in room without DME without LOB and encouraged RW for safety. Pt reports no SOB or fatigue after gait today. Will continue to follow to maximize balance.   Follow Up Recommendations  Home health PT;Supervision - Intermittent     Equipment Recommendations       Recommendations for Other Services       Precautions / Restrictions Precautions Precautions: Fall Restrictions Weight Bearing Restrictions: No    Mobility  Bed Mobility Overal bed mobility: Modified Independent                Transfers Overall transfer level: Modified independent                  Ambulation/Gait Ambulation/Gait assistance: Supervision Ambulation Distance (Feet): 700 Feet Assistive device: Rolling walker (2 wheeled);None Gait Pattern/deviations: Step-through pattern   Gait velocity interpretation: at or above normal speed for age/gender General Gait Details: pt walked first 150' without RW with decreased speed and increased sway. RW rest of gait with smooth gait and cues for position in RW. Encouraged continued use of rW and pt agreeable as able to note difference himself   Financial trader [employer] (Stroke Patients Only)       Balance                                    Cognition Arousal/Alertness: Awake/alert Behavior During Therapy: WFL for tasks assessed/performed Overall Cognitive Status: Within Functional Limits for tasks assessed                      Exercises General  Exercises - Lower Extremity Long Arc Quad: AROM;Seated;Both;20 reps Hip ABduction/ADduction: AROM;Seated;Both;20 reps Hip Flexion/Marching: AROM;Seated;Both;20 reps Toe Raises: AROM;Seated;Both;20 reps Heel Raises: AROM;Seated;Both;20 reps    General Comments        Pertinent Vitals/Pain Pain Assessment: No/denies pain    Home Living                      Prior Function            PT Goals (current goals can now be found in the care plan section) Progress towards PT goals: Progressing toward goals    Frequency       PT Plan Current plan remains appropriate    Co-evaluation             End of Session Equipment Utilized During Treatment: Gait belt Activity Tolerance: Patient tolerated treatment well Patient left: in chair;with call bell/phone within reach;with chair alarm set     Time: 1201-1225 PT Time Calculation (min) (ACUTE ONLY): 24 min  Charges:  $Gait Training: 8-22 mins $Therapeutic Exercise: 8-22 mins                    G Codes:      Melford Aase 11-14-14, 12:29 PM Elwyn Reach, [place]

## 2014-11-08 LAB — BASIC METABOLIC PANEL
Anion gap: 6 (ref 5–15)
BUN: 24 mg/dL — ABNORMAL HIGH (ref 6–20)
CALCIUM: 8 mg/dL — AB (ref 8.9–10.3)
CO2: 27 mmol/L (ref 22–32)
Chloride: 103 mmol/L (ref 101–111)
Creatinine, Ser: 2.4 mg/dL — ABNORMAL HIGH (ref 0.61–1.24)
GFR, EST AFRICAN AMERICAN: 33 mL/min — AB (ref >60)
GFR, EST NON AFRICAN AMERICAN: 28 mL/min — AB (ref >60)
GLUCOSE: 85 mg/dL (ref 65–99)
Potassium: 4.2 mmol/L (ref 3.5–5.1)
Sodium: 136 mmol/L (ref 135–145)

## 2014-11-08 MED ORDER — FERROUS SULFATE 325 (65 FE) MG PO TABS: 325.0000 mg | ORAL_TABLET | Freq: Every day | ORAL | Status: DC

## 2014-11-08 MED ORDER — LACTULOSE 10 GM/15ML PO SOLN: 30.0000 g | Freq: Two times a day (BID) | ORAL | Status: DC

## 2014-11-08 MED ORDER — SPIRONOLACTONE 100 MG PO TABS: 400.0000 mg | ORAL_TABLET | Freq: Every day | ORAL | Status: DC

## 2014-11-08 MED ORDER — THIAMINE HCL 50 MG PO TABS: 500.0000 mg | ORAL_TABLET | Freq: Every day | ORAL | Status: DC

## 2014-11-08 MED ORDER — CARVEDILOL 6.25 MG PO TABS: 6.2500 mg | ORAL_TABLET | Freq: Two times a day (BID) | ORAL | Status: DC

## 2014-11-08 MED ORDER — FUROSEMIDE 40 MG PO TABS: 120.0000 mg | ORAL_TABLET | Freq: Every day | ORAL | Status: DC

## 2014-11-08 NOTE — Progress Notes (Signed)
Subjective: No complaints today. No Difficulty breathing. Eager to go home today.   Objective: Vital signs in last 24 hours: Filed Vitals:   11/07/14 1440 11/07/14 2042 11/08/14 0604 11/08/14 1010  BP: 125/81 149/87 130/80 138/90  Pulse: 65 68 71 64  Temp: 98.4 F (36.9 C) 97.4 F (36.3 C) 98.4 F (36.9 C)   TempSrc: Oral Oral Oral   Resp: 18 20 18    Height:      Weight:   227 lb 12.8 oz (103.329 kg)   SpO2: 98% 96% 97%    Weight change: -2 lb (-0.907 kg)  Intake/Output Summary (Last 24 hours) at 11/08/14 1206 Last data filed at 11/08/14 1024  Gross per 24 hour  Intake   1245 ml  Output    500 ml  Net    745 ml   General appearance: alert, cooperative, appears as stated age and in no distress Head: Normocephalic, without obvious abnormality, atraumatic, moist oral mucosa Lungs: clear to auscultation bilaterally Heart: regular rate and rhythm, S1, S2 normal, no murmur, click, rub or gallop Abdomen: soft, non-tender; distended, bowel sounds normal; Extremities: extremities normal, atraumatic, trace pitting edema . Pulses: 2+ and symmetric, dry skin  Lab Results: Basic Metabolic Panel:  Recent Labs Lab 11/07/14 0331 11/08/14 0251  NA 138 136  K 4.3 4.2  CL 105 103  CO2 27 27  GLUCOSE 88 85  BUN 21* 24*  CREATININE 2.26* 2.40*  CALCIUM 7.9* 8.0*   CBC:  Recent Labs Lab 11/04/14 0433 11/06/14 1128  WBC 5.7 5.3  HGB 8.0* 8.7*  HCT 23.7* 26.2*  MCV 81.2 80.1  PLT 48* 51*    Coagulation:  Recent Labs Lab 11/06/14 0320  LABPROT 18.7*  INR 1.56*   Urine Drug Screen: Drugs of Abuse     Component Value Date/Time   LABOPIA NONE DETECTED 10/31/2014 2127   COCAINSCRNUR POSITIVE* 10/31/2014 2127   LABBENZ NONE DETECTED 10/31/2014 2127   AMPHETMU NONE DETECTED 10/31/2014 2127   THCU NONE DETECTED 10/31/2014 2127   LABBARB NONE DETECTED 10/31/2014 2127    Micro Results: Recent Results (from the past 240 hour(s))  Culture, body fluid-bottle      Status: None   Collection Time: 11/01/14  3:23 PM  Result Value Ref Range Status   Specimen Description FLUID ASCITIC  Final   Special Requests NONE  Final   Culture NO GROWTH 5 DAYS  Final   Report Status 11/06/2014 FINAL  Final  Gram stain     Status: None   Collection Time: 11/01/14  3:23 PM  Result Value Ref Range Status   Specimen Description FLUID ASCITIC  Final   Special Requests NONE  Final   Gram Stain   Final    ABUNDANT WBC PRESENT,BOTH PMN AND MONONUCLEAR NO ORGANISMS SEEN    Report Status 11/01/2014 FINAL  Final   Medications: I have reviewed the patient's current medications. Scheduled Meds: . carvedilol  6.25 mg Oral BID WC  . ferrous sulfate  325 mg Oral Daily  . fluticasone  1 spray Each Nare Daily  . folic acid  1 mg Oral Daily  . furosemide  120 mg Oral Daily  . lactulose  30 g Oral BID  . spironolactone  400 mg Oral Daily  . thiamine  100 mg Oral Daily   Continuous Infusions:  PRN Meds:.albuterol, phenol-menthol Assessment/Plan:  1. Decompensated HCV/Alcoholic Cirrhosis: Stable. With Ascities. On PO lasix, 120mg  daily. Cr today slight increase from yesterday- 2.4 today,  likely residual effect of prior IV lasix. - Discharge home today with PO lasix 120mg  daily - Cont Spironolactone at 400mg  daily - Cont Coreg- 6.125mg  BID - Folate/Thiamine - Will Order Home health PT and OT  - Follow up 11/11/2014 for Cr and K check ensure compliance with meds  2. Acute on CKD IV: On PO lasix. Cr- 2.4 today - Switch to PO lasix as above - Spironolactone as above.  3. HFpEF: EF 60-65% (08/4490) Grade 1 diastolic dysfunction. - Lasix 120mg  daily PO - Cont Coreg 6.125mg  daily  4. Hx of metabolic Encepahlopthy: Hx of.  - Will cont Lactulose for now, at reduced dose- 30mg  BID - PCP to follow up as outpt if pt really needs this medication, can consider discontiniuing with close follow up to check his mental status.   5. Polysubstance Abuse: Patient has history of  alcohol abuse with current cocaine use. Patient counseled to abstain from alcohol, cocaine, and other illicit substances. - Social work consult.  6. Thrombocytopenia/Anemia: Hgb stable so far-  8.7  Platelets-51,  11/06/2014. - Cont Fe tabs at - 365mg  daily  7. 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  8. NAsal Congestion- Felt better today. Nasal saline spray-at bedside, will cont.  - Flonase for one day, if nasal bleed cont d/c   9. HTN- Lasix, Spironolactone and Coreg as above.  FEN/GI -On dysphagia 3 diet- per Swallow eval - Salt restriction and Fluid restriction.  Dispo: Disposition is deferred at this time, awaiting improvement of current medical problems.    The patient does have a current PCP (No Pcp Per Patient) and does need an [facility] hospital follow-up appointment after discharge.  The patient does not know have transportation limitations that hinder transportation to clinic appointments.   LOS: 8 days   John Roys, MD 11/08/2014, 12:06 PM

## 2014-11-08 NOTE — Progress Notes (Signed)
Subjective: No new complaints today. He states that he feels his abdominal distension is unchanged from yesterday. His breathing is comfortable. He had a small nosebleed yesterday.  No leakage from paracentesis site.  Objective: Vital signs in last 24 hours: Filed Vitals:   11/07/14 1004 11/07/14 1440 11/07/14 2042 11/08/14 0604  BP: 128/76 125/81 149/87 130/80  Pulse: 66 65 68 71  Temp: 97.7 F (36.5 C) 98.4 F (36.9 C) 97.4 F (36.3 C) 98.4 F (36.9 C)  TempSrc: Oral Oral Oral Oral  Resp: 20 18 20 18   Height:      Weight:    103.329 kg (227 lb 12.8 oz)  SpO2: 99% 98% 96% 97%   Weight change: -0.907 kg (-2 lb)  Intake/Output Summary (Last 24 hours) at 11/08/14 0804 Last data filed at 11/08/14 9518  Gross per 24 hour  Intake    960 ml  Output    600 ml  Net    360 ml   Last standing weights:  10/31/2014: 230 lb 11/03/2014: 233 lb 8 oz 11/04/2014: 231 lb 1.6 oz 11/06/2014: 227 lb 14 oz 11/06/2013: 229 lb 12.8oz 11/08/2014: 227 lbs 12 oz  General Appearance: Well-developed, well-nourished, conversant and in NAD Head: Normocephalic,atraumatic Nose: Crusting at nares Eyes: Scleral icterus present Lungs: No increased work of breathing on RA; CTAB; no distress Heart: Regular rate and rhythm, S1 and S2 normal, no murmur, rub or gallop Abdomen:Distended - unchanged from yesterday, tender at sites of prior paracentesis drainiage; BS+; anasarca appears mildly improved Extremities: Tracel bilateral pitting edema of the lower extremities, slightly improved today; 2+ DP pulses bilaterally Skin: Skin is warm and dry with some pretibial skin scaling bilaterally  Labs: Basic Metabolic Panel:  Recent Labs Lab 11/07/14 0331 11/08/14 0251  NA 138 136  K 4.3 4.2  CL 105 103  CO2 27 27  GLUCOSE 88 85  BUN 21* 24*  CREATININE 2.26* 2.40*  CALCIUM 7.9* 8.0*   CBC:  Recent Labs Lab 11/04/14 0433 11/06/14 1128  WBC 5.7 5.3  HGB 8.0* 8.7*  HCT 23.7* 26.2*  MCV 81.2  80.1  PLT 48* 51*   Coagulation:  Recent Labs Lab 11/06/14 0320  LABPROT 18.7*  INR 1.56*   Urine Drug Screen: Drugs of Abuse     Component Value Date/Time   LABOPIA NONE DETECTED 10/31/2014 2127   COCAINSCRNUR POSITIVE* 10/31/2014 2127   LABBENZ NONE DETECTED 10/31/2014 2127   AMPHETMU NONE DETECTED 10/31/2014 2127   THCU NONE DETECTED 10/31/2014 2127   LABBARB NONE DETECTED 10/31/2014 2127    Medications:  Scheduled Medications: . carvedilol  6.25 mg Oral BID WC  . ferrous sulfate  325 mg Oral Daily  . fluticasone  1 spray Each Nare Daily  . folic acid  1 mg Oral Daily  . furosemide  120 mg Oral Daily  . lactulose  30 g Oral BID  . spironolactone  400 mg Oral Daily  . thiamine  100 mg Oral Daily   PRN Medications: albuterol, phenol-menthol    Assessment/Plan: Principal Problem:   Alcoholic cirrhosis of liver with ascites Active Problems:   Essential hypertension, benign   Polysubstance abuse   Abnormal LFTs   Acute on chronic renal failure   Fluid overload   Hepatitis C   Ascites of liver  John [name] is a 59 yo male with a history of alcoholic and HCV cirrhosis complicated by portal hypertension and ascites, polysubstance abuse (EtOH and cocaine), CHF, and hypertension who presented to [facility]  on 10/31/14 with a 3 day history of worsening shortness of breath, consistent with decompensated cirrhosis after not taking any of his diuretics or other medications for the past month. Patient is now s/p 5.3L paracenteses, but remains volume overloaded with significant ascites and anasarca. We are continuing to diuresis him with spironolactone and Lasix per below.  #Total body volume overload secondary to decompensated HCV/Alcoholic Cirrhosis: Anasarca and lower extremity edema continues to improve; however, patient remains significantly distended. Standing weight today 227lbs 12oz which is down only about 2 lbs from admission (10/31/14) and from yesterday. Net I/O since  admission = -1233mL (up 360 mL from yesterday). Plan is to discharge with close follow-up [facility]. Appointment has been scheduled for 11/11/2014. Will discharge on spironolactone 400mg  by mouth daily in the am and Lasix 120mg  lasix by mouth daily in the am. Creatinine and potassium to be closely followed as an outpatient. Home health arranged with assistance of social work ([place]). Rolling walker ordered. - Continue oral Lasix: 120mg  by mouth daily in am - Continue spironolactone at 400mg  PO daily in am - Continue to monitor his K, Cr, and anasarca/abdominal distension - Continue lactulose to 10g BID; consider dc'ing at dischage - Creatine: 3.34 -> 3.06 -> 2.57 -> 2.34 -> 2.02 -> 2.26 -> 2.40; K remains stable; repeat BMP tomorrow am; small bump from yesterday (creatinine was 3.34 on admission; 2.62 at time of discharge at last hospitalization) - Continue to emphasize the importance of medication compliance and follow-up with PCP and gastroenterologist - Continue salt restricted diet - Continue strict I/Os - Continue daily weights  #Hypertension: Patient has had well-controlled BP over past 24 hours with systolics BPs in the 601U to 140s. Most recently 130/80. - Continue Coreg (carvedilol) 6.25 BID with meals - Spironolactone and Lasix per above.  #Thrombocytopenia: Likely 2/2 longstanding alcohol abuse, liver disease, and anemia of chronic disease. Hemoglobin has been stable. Platelets: 120 -> 88 -> 61 -> 45 -> 48 -> 51. - INR stable at 1.60 -> 1.56 (11/06/14); continue SCDs  #Anemia: Patient had iron studies performed on 08/28/2014: serum Fe = 32, TIBC = 237, Fe saturation = 14%, ferritin = 304. B12/folate normal.  - Continue oral Fe supplementation 365mg  qd  #Acute on CKD IV: Creatinine increased this morning per above.  - Lasix and spironolactone as above - Trend creatinine; K per above  #Nasal congestion/sore throat. Nearly resolved. -  Continue Flonase daily - Continue PRN throat lozenges   #Polysubstance Abuse: Patient has history of alcohol abuse with current cocaine use. Patient counseled to abstain from alcohol, cocaine, and other illicit substances. - Continue Phenergan 12.5 mg q6hours PRN nausea - Continue Folate/Thiamine  #HFpEF: EF 60-65% (12/3233) Grade 1 diastolic dysfunction.  - Continue Lasix as above  #FEN/GI/PPx -Diet = Dysphagia 3 and salt restriction. -Fluids = Restrict -Electrolytes = CTM -DVT = SCDs  Code Status -Full code   Disposition -Anticipated discharge today. -The patient does not have a current PCP (No PCP Per Patient) and does need an [facility] hospital follow-up appointment after discharge.  SERVICE NEEDED AT [place]         Y = Yes, Blank = No PT:   OT:   RN:   Equipment:   Other:    Length of Stay: 8 day(s)  This is a Careers information officer Note.  The care of the patient was discussed with Dr. Denton Brick and the assessment  and plan formulated with their assistance.  Please see their attached note for official documentation of the daily encounter.   LOS: 8 days   Quita Skye, Med Student 11/08/2014, 8:04 AM

## 2014-11-08 NOTE — Progress Notes (Signed)
Sheppard Evens with [facility] called and made aware of discharge home today; Aneta Mins [phone]

## 2014-11-08 NOTE — Discharge Summary (Signed)
Name: John [name] MRN: [id number] DOB: 03/09/1956 59 y.o. PCP: No Pcp Per Patient  Date of Admission: 10/31/2014  6:06 PM Date of Discharge: 11/08/2014 Attending Physician: Oval Linsey, MD  Discharge Diagnosis:  Principal Problem:   Alcoholic cirrhosis of liver with ascites Active Problems:   Essential hypertension, benign   Polysubstance abuse   Abnormal LFTs   Acute on chronic renal failure   Fluid overload   Hepatitis C   Ascites of liver  Discharge Medications:   Medication List    STOP taking these medications        ciprofloxacin 500 MG tablet  Commonly known as:  CIPRO      TAKE these medications        carvedilol 6.25 MG tablet  Commonly known as:  COREG  Take 1 tablet (6.25 mg total) by mouth 2 (two) times daily with a meal.     ferrous sulfate 325 (65 FE) MG tablet  Take 1 tablet (325 mg total) by mouth daily.     folic acid 1 MG tablet  Commonly known as:  FOLVITE  Take 1 tablet (1 mg total) by mouth daily.     furosemide 40 MG tablet  Commonly known as:  LASIX  Take 3 tablets (120 mg total) by mouth daily.     lactulose 10 GM/15ML solution  Commonly known as:  CHRONULAC  Take 45 mLs (30 g total) by mouth 2 (two) times daily.     pantoprazole 40 MG tablet  Commonly known as:  PROTONIX  Take 1 tablet (40 mg total) by mouth daily.     rifaximin 200 MG tablet  Commonly known as:  XIFAXAN  Take 2 tablets (400 mg total) by mouth 3 (three) times daily.     spironolactone 100 MG tablet  Commonly known as:  ALDACTONE  Take 4 tablets (400 mg total) by mouth daily.     thiamine 50 MG tablet  Take 10 tablets (500 mg total) by mouth daily.        Disposition and follow-up:   John [name] was discharged from [facility] in Good condition.  At the hospital follow up visit please address:  1.  Evaluation of ascites and response to change in diuretic regimen; possible discontinuation of lactulose; possible follow-up  with gastroenterologist.   2.  Labs / imaging needed at time of follow-up: Bmet- K, Cr.  3.  Pending labs/ test needing follow-up: None  Follow-up Appointments:     Follow-up Information    Follow up with No PCP Per Patient.   Specialty:  General Practice      Follow up with [facility]   Specialty:  [place]   Why:  they will provide your home health care at your home   Contact information:   [place] [address] [phone]       Follow up with Arnoldo Morale, MD On 11/11/2014.   Specialty:  Family Medicine   Why:  12pm with Dr. Lisette Grinder information:   [place] [id number] [phone]      Discharge Instructions: Discharge Instructions    Diet - low sodium heart healthy    Complete by:  As directed      Discharge instructions    Complete by:  As directed   1) You have appointment at the [facility] with Dr. Jarold Song on 11/11/2014 at 12pm. It is very  important for you to make it to this appointment becuase your medications have been adjusted and you will need lab work done.   2) Please please please take your medications as prescribed.  3) Changes to your medications: - Please start taking Coreg 6.25mg  two times a day with meals - Please take Lasix 120mg  orally in the morning daily - Please take Aldactone (sprionolactone) 400mg  orally in the morning daily - Please take lactulose 30g (13mL) two times a day - Continue your other medications as you were taking them prior to your hospitalization.     Increase activity slowly    Complete by:  As directed            Consultations:  None  Procedures Performed:  Dg Chest 2 View  10/31/2014   CLINICAL DATA:  Shortness of breath for 2 days.  EXAM: CHEST  2 VIEW  COMPARISON:  August 21, 2014  FINDINGS: The mediastinal contour is normal. The heart size is enlarged. There is mild diffuse increased interstitial markings in both  lungs. There is no focal pneumonia or pleural effusion. The visualized skeletal structures are stable.  IMPRESSION: Mild increased pulmonary interstitium bilaterally. Differential diagnosis includes interstitial edema or infectious/viral etiology.   Electronically Signed   By: Abelardo Diesel M.D.   On: 10/31/2014 20:28   Ir Paracentesis  11/01/2014   INDICATION: Symptomatic ascites.  EXAM: ULTRASOUND-GUIDED PARACENTESIS  COMPARISON:  Previous para  MEDICATIONS: 10 cc 1% lidocaine  COMPLICATIONS: None immediate  TECHNIQUE: Informed written consent was obtained from the patient after a discussion of the risks, benefits and alternatives to treatment. A timeout was performed prior to the initiation of the procedure.  Initial ultrasound scanning demonstrates a large amount of ascites within the left lower abdominal quadrant. The left lower abdomen was prepped and draped in the usual sterile fashion. 1% lidocaine with epinephrine was used for local anesthesia. Under direct ultrasound guidance, a 19 gauge, 7-cm, Yueh catheter was introduced. An ultrasound image was saved for documentation purposed.The paracentesis was performed. The catheter was removed and a dressing was applied. The patient tolerated the procedure well without immediate post procedural complication.  FINDINGS: A total of approximately 5.3 liters of yellow fluid was removed. Samples were sent to the laboratory as requested by the clinical team.  IMPRESSION: Successful ultrasound-guided paracentesis yielding 5.3 liters of peritoneal fluid.  Read by:  Lavonia Drafts [facility]   Electronically Signed   By: Sandi Mariscal M.D.   On: 11/01/2014 15:41    2D Echo: None  Cardiac Cath: None  HPI by Dr Iline Oven.  Admission HPI: Chief Complaint: SOB  History of Present Illness: John [name] is 59 yo male with HTN, alcoholic and HCV cirrhosis, polysubstance abuse, and CHF, presenting with 3 day history of worsening shortness of breath. He reports  worsening shortness of breath at rest. He denies cough or chest pain associated with the dyspnea. He also reports slowly enlarging abdomen for the past month. His abdomen is nontender, but significantly distended. He is prescribed Lactulose and Lasix, but has not been taking them for 1-2 months, as he has not been seen at the [facility] clinic. He denies the use of alcohol since he was diagnosed with cirrhosis (approximately 6 months), but endorses cocaine use 2 days ago (7/2). He endorses nausea, but no vomiting. He endorses chills, but denies fever.  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 He did not start Spironolactone following  the admission. He did not followup with a gastroenterologist as an outpatient. He endorses chronic back pain with radiation to the left leg.  Physical Exam: Blood pressure 157/87, pulse 101, temperature 98.3 F (36.8 C), temperature source Oral, resp. rate 22, height 5\' 10"  (1.778 m), weight 277 lb (125.646 kg), SpO2 98 %. Physical Exam  Constitutional: He is oriented to person, place, and time.  Agitated, lying in bed shaking  HENT:  Head: Normocephalic and atraumatic.  Eyes: EOM are normal. Scleral icterus is present.  Neck: No tracheal deviation present.  Cardiovascular: Normal rate, regular rhythm and normal heart sounds.  2+ DP pulses bilaterally.  Pulmonary/Chest:  Increased respiratory effort. No respiratory distress. Minimal wheezes bilaterally. No crackles.  Abdominal:  Large, tense, distended. Nontender. Fluid wave present. No rebound or guarding.  Musculoskeletal:  1+ pitting edema to knees bilaterally  Neurological: He is alert and oriented to person, place, and time.  No asterixis  Skin: Skin is warm and dry.  Sublingual jaundice appreciated.    Hospital Course by problem list:  1. Decompensated HCV/Alcoholic Cirrhosis (Child-Pugh class C [10 pts]; MELD 26): Patient presented  with worsening shortness of breath and abdominal distension due to significant ascites from liver cirrhosis, likely due to medication non compliance with Lasix over the past 4 weeks. Chest x-ray (10/31/2014) showed increased pulmonary interstitium bilaterally, but no diffuse pulmonary edema. Work up included- EKG was unremarkable, with normal troponin and mildly elevated BNP at 398 (>1000 at prior hospitalization), CMET- ALP- 67, ALT- 22, AST- 71, INR- 1.5 Alcohol level 6, UDS positive for Cocaine. On 11/01/2014, patient received ultrasound guided paracentesis with removal of approximately 5.3 liters of yellow ascitic fluid, with significant improvement in his breathing. (Beside paracentesis was not successful). Cell count and culture of ascitic fluid were not concerning for SBP. Patient was started on Lasix at 40 mg IV twice daily and spironolactone at 100 mg orally daily and doses of both gradually titrated up to IV lasix- 120am and 60pm and Spironolactone 400mg  daily, Coreg was also started and maintained at 6.25mg  BID. Patient anasarca gradually improved, but his ascites returned and was resistant to high doses of IV lasix. Initially Cr improved from 3.25 to 2.02, and with increased diuretics bump to 2.4, which appeared to approximate pt Cr at the time of discharge from prior hospitalization-09/02/2014. Patient was then switch to Per Oral Lasix and discharged home with Lasix- 120mg  daily and spironolactone 400mg  daily by mouth. At discharge pts abdomen was still distended, but with improved anarsarca, pt to follow up closely with his PCP, the importance of compliance was stressed. Pt would likely need intermittent/scheduled outpatient Paracentesis. Patient Cr, K and compliance will need to be followed closely.     2. History of hepatic encephalopathy: Patient has a history of hepatic encephalopathy that diagnosed 08/14/2014. Pt reported a 4 week history of non compliance with Lactulose, but with stable mental  status. Pt Lactulose was continued on admission and discharge but at a lower dose 30mg  BID. Would recommend the need for lactulose be evaluated on follow up as an outpt with close monitoring of mental status.   3. Hypertension: Patient had elevated systolic BPs in the 761Y to 170s during hospitalization. Coreg (carvedilol) 6.25mg  twice daily with meals was started on 11/05/2014 to be continued as an outpatient. Also started in the hopes of increasing renal perfusion by reducing afterload.  4. Thrombocytopenia and Anemia: Chronic. But with baseline platelet count prior to admission of approximately 100 to 120,  likely multifactorial due to longstanding alcohol abuse, significant history of GI bleed requiring transfusions, anemia of chronic disease and liver cirrhosis with splenic sequestration. On admission, patient's platelet count measured 120 with subsequent measurements dropping as low as 45.There was initial concern for HIT, but he had a low 4T score (score of 3; low probability of HIT). His last platelet count on (11/05/2013) was stable at 51. Hgb was stable at 8.7 on discharge.  5. Heart Failure with preserved EF: Echocardiogram performed on 07/28/2014 showed EF 60-65% and Grade 1 diastolic dysfunction. Per above, initial chest x-ray in ED showed increased pulmonary interstitium bilaterally consistent with possible volume overload, however, his BNP was low at 398 compared to >1000 during a previous admission. Lasix was continued as discussed above.   Discharge Vitals:   BP 138/90 mmHg  Pulse 64  Temp(Src) 98.4 F (36.9 C) (Oral)  Resp 18  Ht 5\' 10"  (1.778 m)  Wt 227 lb 12.8 oz (103.329 kg)  BMI 32.69 kg/m2  SpO2 97%  Discharge Labs:  Results for orders placed or performed during the hospital encounter of 10/31/14 (from the past 24 hour(s))  Basic metabolic panel     Status: Abnormal   Collection Time: 11/08/14  2:51 AM  Result Value Ref Range   Sodium 136 135 - 145 mmol/L   Potassium 4.2  3.5 - 5.1 mmol/L   Chloride 103 101 - 111 mmol/L   CO2 27 22 - 32 mmol/L   Glucose, Bld 85 65 - 99 mg/dL   BUN 24 (H) 6 - 20 mg/dL   Creatinine, Ser 2.40 (H) 0.61 - 1.24 mg/dL   Calcium 8.0 (L) 8.9 - 10.3 mg/dL   GFR calc non Af Amer 28 (L) >60 mL/min   GFR calc Af Amer 33 (L) >60 mL/min   Anion gap 6 5 - 15    Signed: Bethena Roys, MD 11/08/2014, 6:09 PM   Services Ordered on Discharge: Home health- PT, OT Equipment Ordered on Discharge: None

## 2014-11-08 NOTE — Discharge Instructions (Signed)
1) You have appointment at the [facility] with Dr. Jarold Song on 11/11/2014 at 12pm. It is very important for you to make it to this appointment becuase your medications have been adjusted and you will need lab work done.   2) Please please please take your medications as prescribed.  3) Changes to your medications: - Please start taking Coreg 6.25mg  two times a day with meals - Please take Lasix 120mg  orally in the morning daily - Please take Aldactone (sprionolactone) 400mg  orally in the morning daily - Please take lactulose 30g (65mL) two times a day - Continue your other medications as you were taking them prior to your hospitalization.

## 2014-11-08 NOTE — Progress Notes (Signed)
1300 discharge instructions given .Verbalized understanding >wheeled to lobby by nt

## 2014-11-11 ENCOUNTER — Encounter: Payer: Self-pay | Admitting: Family Medicine

## 2014-11-11 ENCOUNTER — Ambulatory Visit: Payer: Medicaid Other | Attending: Family Medicine | Admitting: Family Medicine

## 2014-11-11 VITALS — BP 130/82 | HR 65 | Temp 97.7°F | Ht 70.0 in | Wt 230.0 lb

## 2014-11-11 DIAGNOSIS — B182 Chronic viral hepatitis C: Secondary | ICD-10-CM | POA: Diagnosis not present

## 2014-11-11 DIAGNOSIS — I10 Essential (primary) hypertension: Secondary | ICD-10-CM | POA: Insufficient documentation

## 2014-11-11 DIAGNOSIS — D62 Acute posthemorrhagic anemia: Secondary | ICD-10-CM

## 2014-11-11 DIAGNOSIS — N183 Chronic kidney disease, stage 3 unspecified: Secondary | ICD-10-CM

## 2014-11-11 DIAGNOSIS — K7031 Alcoholic cirrhosis of liver with ascites: Secondary | ICD-10-CM | POA: Diagnosis not present

## 2014-11-11 DIAGNOSIS — D696 Thrombocytopenia, unspecified: Secondary | ICD-10-CM

## 2014-11-11 DIAGNOSIS — K7460 Unspecified cirrhosis of liver: Secondary | ICD-10-CM

## 2014-11-11 LAB — COMPREHENSIVE METABOLIC PANEL
ALT: 14 U/L (ref 0–53)
AST: 37 U/L (ref 0–37)
Albumin: 2.3 g/dL — ABNORMAL LOW (ref 3.5–5.2)
Alkaline Phosphatase: 100 U/L (ref 39–117)
BILIRUBIN TOTAL: 1.8 mg/dL — AB (ref 0.2–1.2)
BUN: 32 mg/dL — ABNORMAL HIGH (ref 6–23)
CALCIUM: 8.5 mg/dL (ref 8.4–10.5)
CO2: 25 meq/L (ref 19–32)
Chloride: 102 mEq/L (ref 96–112)
Creat: 2.61 mg/dL — ABNORMAL HIGH (ref 0.50–1.35)
Glucose, Bld: 85 mg/dL (ref 70–99)
POTASSIUM: 5.3 meq/L (ref 3.5–5.3)
SODIUM: 137 meq/L (ref 135–145)
Total Protein: 8.6 g/dL — ABNORMAL HIGH (ref 6.0–8.3)

## 2014-11-11 MED ORDER — PANTOPRAZOLE SODIUM 40 MG PO TBEC: 40.0000 mg | DELAYED_RELEASE_TABLET | Freq: Every day | ORAL | Status: DC

## 2014-11-11 MED ORDER — FERROUS SULFATE 325 (65 FE) MG PO TABS: 325.0000 mg | ORAL_TABLET | Freq: Every day | ORAL | Status: DC

## 2014-11-11 NOTE — Patient Instructions (Signed)
Cirrhosis  Cirrhosis is a condition of scarring of the liver which is caused when the liver has tried repairing itself following damage. This damage may come from a previous infection such as one of the forms of hepatitis (usually hepatitis C), or the damage may come from being injured by toxins. The main toxin that causes this damage is alcohol. The scarring of the liver from use of alcohol is irreversible. That means the liver cannot return to normal even though alcohol is not used any more. The main danger of hepatitis C infection is that it may cause long-lasting (chronic) liver disease, and this also may lead to cirrhosis. This complication is progressive and irreversible.  CAUSES   Prior to available blood tests, hepatitis C could be contracted by blood transfusions. Since testing of blood has improved, this is now unlikely. This infection can also be contracted through intravenous drug use and the sharing of needles. It can also be contracted through sexual relationships. The injury caused by alcohol comes from too much use. It is not a few drinks that poison the liver, but years of misuse. Usually there will be some signs and symptoms early with scarring of the liver that suggest the development of better habits. Alcohol should never be used while using acetaminophen. A small dose of both taken together may cause irreversible damage to the liver.  HOME CARE INSTRUCTIONS   There is no specific treatment for cirrhosis. However, there are things you can do to avoid making the condition worse.  · Rest as needed.  · Eat a well-balanced diet. Your caregiver can help you with suggestions.  · Vitamin supplements including vitamins A, K, D, and thiamine can help.  · A low-salt diet, water restriction, or diuretic medicine may be needed to reduce fluid retention.  · Avoid alcohol. This can be extremely toxic if combined with acetaminophen.  · Avoid drugs which are toxic to the liver. Some of these include isoniazid,  methyldopa, acetaminophen, anabolic steroids (muscle-building drugs), erythromycin, and oral contraceptives (birth control pills). Check with your caregiver to make sure medicines you are presently taking will not be harmful.  · Periodic blood tests may be required. Follow your caregiver's advice regarding the timing of these.  · Milk thistle is an herbal remedy which does protect the liver against toxins. However, it will not help once the liver has been scarred.  SEEK MEDICAL CARE IF:  · You have increasing fatigue or weakness.  · You develop swelling of the hands, feet, legs, or face.  · You vomit bright red blood, or a coffee ground appearing material.  · You have blood in your stools, or the stools turn black and tarry.  · You have a fever.  · You develop loss of appetite, or have nausea and vomiting.  · You develop jaundice.  · You develop easy bruising or bleeding.  · You have worsening of any of the problems you are concerned about.  Document Released: 04/15/2005 Document Revised: 07/08/2011 Document Reviewed: 12/02/2007  [employer]® Patient Information ©2015 [employer]. This information is not intended to replace advice given to you by your health care provider. Make sure you discuss any questions you have with your health care provider.

## 2014-11-11 NOTE — Progress Notes (Signed)
Subjective:    Patient ID: John [name], male    DOB: 02-25-1956, 59 y.o.   MRN: [id number]  HPI  John [name] is a 59 year old male with a history of HTN, Hepatitis C, Alcoholic Liver Cirrhosis, CHF who presented to [facility] ED with worsening shortness of breath, enlarging abdomen and had admitted to being noncompliant with his lactulose and Lasix. He had been diagnosed with liver cirrhosis 6 months prior to presentation and had a hospitalization in 08/2014 where he was treated for bleeding esophageal varices and hepatic encephalopathy.  Chest x-ray showed increased pulmonary interstitium bilaterally, but no diffuse pulmonary edema. EKG was unremarkable, with normal troponin and mildly elevated BNP at 398 (>1000 at prior hospitalization), CMET- ALP- 67, ALT- 22, AST- 71, INR- 1.5. Alcohol level 6, UDS positive for Cocaine. On 11/01/2014, patient received ultrasound guided paracentesis with removal of approximately 5.3 liters of yellow ascitic fluid, with significant improvement in his breathing. (Beside paracentesis was not successful). Cell count and culture of ascitic fluid were not concerning for SBP. Patient was started on Lasix at 40 mg IV twice daily and spironolactone at 100 mg orally daily and doses of both gradually titrated up to IV lasix- 120am and 60pm and Spironolactone 400mg  daily, Coreg was also started and maintained at 6.25mg  BID. Patient's anasarca gradually improved, but his ascites returned and was resistant to high doses of IV lasix. Initially Cr improved from 3.25 to 2.02, and with increased diuretics there was a bump to 2.4. Patient was then switched to Per Oral Lasix and discharged home with Lasix- 120mg  daily and spironolactone 400mg  daily by mouth. At discharge pts abdomen was still distended, but with improved anarsarca.  Interval history: He denies shortness of breath, but does admit that his abdomen is still distended and he has some right upper quadrant pain  which is intermittent as well as mild edema of his thighs bilaterally. He admits to being compliant with his medications and has not drunk alcohol for the last 3 months. Review of his medications indicate he is not taking pantoprazole and neither is he taking ferrous sulfate.   Past Medical History  Diagnosis Date  . Hypertension   . Chronic back pain   . Stroke 1998  . Polysubstance abuse   . Optic neuropathy, left   . CHF (congestive heart failure)   . Ascites   . ETOH abuse     Past Surgical History  Procedure Laterality Date  . None    . Esophagogastroduodenoscopy N/A 08/14/2014    Procedure: ESOPHAGOGASTRODUODENOSCOPY (EGD);  Surgeon: Ladene Artist, MD;  Location: [facility];  Service: Endoscopy;  Laterality: N/A;  . Radiology with anesthesia N/A 08/15/2014    Procedure: RADIOLOGY WITH ANESTHESIA;  Surgeon: Greggory Keen, MD;  Location: [place];  Service: Radiology;  Laterality: N/A;    History   Social History  . Marital Status: Single    Spouse Name: N/A  . Number of Children: N/A  . Years of Education: N/A   Occupational History  . Not on file.   Social History Main Topics  . Smoking status: Former Smoker -- 0.30 packs/day for 20 years    Types: Cigarettes  . Smokeless tobacco: Current User    Types: Snuff     Comment: 2015  . Alcohol Use: No     Comment: occ  . Drug Use: Yes    Special: Cocaine     Comment: since fall 2015  . Sexual Activity: Not on file  Other Topics Concern  . Not on file   Social History Narrative   Lives with niece in a 2 story home.     On disability since 2015 for low back pain.  Used to work Statistician.    Highest level of education: 11th grade    Allergies  Allergen Reactions  . Penicillins     Convulsions    Current Outpatient Prescriptions on File Prior to Visit  Medication Sig Dispense Refill  . carvedilol (COREG) 6.25 MG tablet Take 1 tablet (6.25 mg total) by mouth 2 (two) times daily with a meal. 60  tablet 1  . furosemide (LASIX) 40 MG tablet Take 3 tablets (120 mg total) by mouth daily. 90 tablet 2  . lactulose (CHRONULAC) 10 GM/15ML solution Take 45 mLs (30 g total) by mouth 2 (two) times daily. 240 mL 0  . spironolactone (ALDACTONE) 100 MG tablet Take 4 tablets (400 mg total) by mouth daily. 30 tablet 1  . folic acid (FOLVITE) 1 MG tablet Take 1 tablet (1 mg total) by mouth daily. (Patient not taking: Reported on 10/31/2014) 30 tablet 0  . rifaximin (XIFAXAN) 200 MG tablet Take 2 tablets (400 mg total) by mouth 3 (three) times daily. (Patient not taking: Reported on 10/31/2014) 180 tablet 0  . thiamine 50 MG tablet Take 10 tablets (500 mg total) by mouth daily. (Patient not taking: Reported on 11/11/2014) 30 tablet 0   No current facility-administered medications on file prior to visit.      ,Review of Systems  Constitutional: Negative for activity change and appetite change.  HENT: Negative for sinus pressure and sore throat.   Eyes: Negative for visual disturbance.  Respiratory: Negative for chest tightness and shortness of breath.   Cardiovascular: Positive for leg swelling. Negative for chest pain and palpitations.  Gastrointestinal: Positive for abdominal pain (right upper quadrant). Negative for abdominal distention.  Endocrine: Negative for cold intolerance, heat intolerance and polyphagia.  Genitourinary: Negative for dysuria, frequency and difficulty urinating.  Musculoskeletal: Negative for back pain, joint swelling and arthralgias.  Skin: Negative for color change.  Neurological: Negative for dizziness, tremors and weakness.  Psychiatric/Behavioral: Negative for suicidal ideas and behavioral problems.         Objective: Filed Vitals:   11/11/14 1222  BP: 130/82  Pulse: 65  Temp: 97.7 F (36.5 C)  Height: 5\' 10"  (1.778 m)  Weight: 230 lb (104.327 kg)  SpO2: 97%      Physical Exam  Constitutional: He is oriented to person, place, and time. He appears  well-developed and well-nourished.  HENT:  Head: Normocephalic and atraumatic.  Right Ear: External ear normal.  Left Ear: External ear normal.  Eyes: Conjunctivae and EOM are normal. Pupils are equal, round, and reactive to light.  Neck: Normal range of motion. Neck supple. No tracheal deviation present.  Cardiovascular: Normal rate, regular rhythm and normal heart sounds.   No murmur heard. Pulmonary/Chest: Effort normal and breath sounds normal. No respiratory distress. He has no wheezes. He exhibits no tenderness.  Abdominal: Soft. Bowel sounds are normal. He exhibits distension (ascites, abdominal girth 51in). He exhibits no mass. There is tenderness (RUQ).  Musculoskeletal: Normal range of motion. He exhibits edema (1+ non pitting in the thighs). He exhibits no tenderness.  Neurological: He is alert and oriented to person, place, and time.  Skin: Skin is warm and dry.  Psychiatric: He has a normal mood and affect.     INDICATION: Symptomatic ascites.  EXAM: ULTRASOUND-GUIDED  PARACENTESIS  COMPARISON: Previous para  MEDICATIONS: 10 cc 1% lidocaine  COMPLICATIONS: None immediate  TECHNIQUE: Informed written consent was obtained from the patient after a discussion of the risks, benefits and alternatives to treatment. A timeout was performed prior to the initiation of the procedure.  Initial ultrasound scanning demonstrates a large amount of ascites within the left lower abdominal quadrant. The left lower abdomen was prepped and draped in the usual sterile fashion. 1% lidocaine with epinephrine was used for local anesthesia. Under direct ultrasound guidance, a 19 gauge, 7-cm, Yueh catheter was introduced. An ultrasound image was saved for documentation purposed.The paracentesis was performed. The catheter was removed and a dressing was applied. The patient tolerated the procedure well without immediate post procedural complication.  FINDINGS: A total of  approximately 5.3 liters of yellow fluid was removed. Samples were sent to the laboratory as requested by the clinical team.  IMPRESSION: Successful ultrasound-guided paracentesis yielding 5.3 liters of peritoneal fluid.  Read by: Lavonia Drafts [facility]   Electronically Signed  By: Sandi Mariscal M.D.  On: 11/01/2014 15:41     Assessment & Plan:  59 year old patient with a history of hypertension, alcoholic and hepatitis C liver cirrhosis, CHF recently managed for decompensated hepatitis C/alcoholic cirrhosis.  Hepatitis C/alcoholic cirrhosis with ascites: Child Pugh class C (9 points), MELD score -23 Increased abdominal girth the patient is not dyspneic so I will hold off on referring him for a paracentesis and will reassess him at his next visit in 1 week Referred to GI for optimization of management. Continue current medications and I have refilled pantoprazole for him. He continues to abstain from alcohol use and I have commended him on this. I am sending off a CMET, ammonia level, PT/PTT  Hypertension: Controlled. Continue medications.   Anemia: I have refilled first sulfates which he hasn't been taking.  Thrombocytopenia: Multifactorial etiology, except on discharge was 68 We'll repeat a CBC at his next visit  CKD: He will need a referral to Nephrology at his next visit. There could also be a hepatorenal syndrome component here.   This note has been created with Surveyor, quantity. Any transcriptional errors are unintentional.

## 2014-11-11 NOTE — Progress Notes (Signed)
Patient reports he is doing better but he is having abdominal pain Patient reports using cocaine 3 weeks ago Patient took medications this morning

## 2014-11-12 LAB — PROTIME-INR
INR: 1.36 (ref <1.50)
PROTHROMBIN TIME: 16.8 s — AB (ref 11.6–15.2)

## 2014-11-12 LAB — APTT: APTT: 36 s (ref 24–37)

## 2014-11-12 LAB — AMMONIA: AMMONIA: 46 umol/L (ref 16–53)

## 2014-11-14 ENCOUNTER — Telehealth: Payer: Self-pay | Admitting: *Deleted

## 2014-11-14 NOTE — Telephone Encounter (Signed)
Left message on [facility] voicemail giving medication information.  According to Epic patient has given permission for [place] to do so.

## 2014-11-14 NOTE — Telephone Encounter (Signed)
-----   Message from Arnoldo Morale, MD sent at 11/12/2014 11:17 PM EDT ----- Renal function shows a decline- will refer to Nephrology at next visit; bilirubin is also elevated from his liver cirrhosis for which i had placed a GI referral for him.

## 2014-11-17 ENCOUNTER — Telehealth: Payer: Self-pay | Admitting: *Deleted

## 2014-11-17 NOTE — Telephone Encounter (Signed)
Tried calling patient with lab results again.  The home number is not in service and the cell number has a voicemail message with a woman named Jocelyn Lamer on it.

## 2014-11-17 NOTE — Telephone Encounter (Signed)
-----   Message from Arnoldo Morale, MD sent at 11/12/2014 11:17 PM EDT ----- Renal function shows a decline- will refer to Nephrology at next visit; bilirubin is also elevated from his liver cirrhosis for which i had placed a GI referral for him.

## 2014-11-18 ENCOUNTER — Encounter: Payer: Self-pay | Admitting: Family Medicine

## 2014-11-18 ENCOUNTER — Ambulatory Visit: Payer: Medicaid Other | Attending: Family Medicine | Admitting: Family Medicine

## 2014-11-18 VITALS — BP 144/91 | HR 64 | Temp 98.1°F | Ht 70.0 in | Wt 234.6 lb

## 2014-11-18 DIAGNOSIS — D62 Acute posthemorrhagic anemia: Secondary | ICD-10-CM | POA: Insufficient documentation

## 2014-11-18 DIAGNOSIS — I10 Essential (primary) hypertension: Secondary | ICD-10-CM | POA: Insufficient documentation

## 2014-11-18 DIAGNOSIS — K7031 Alcoholic cirrhosis of liver with ascites: Secondary | ICD-10-CM | POA: Diagnosis not present

## 2014-11-18 DIAGNOSIS — D696 Thrombocytopenia, unspecified: Secondary | ICD-10-CM | POA: Diagnosis not present

## 2014-11-18 DIAGNOSIS — N183 Chronic kidney disease, stage 3 unspecified: Secondary | ICD-10-CM

## 2014-11-18 DIAGNOSIS — Z87891 Personal history of nicotine dependence: Secondary | ICD-10-CM | POA: Diagnosis not present

## 2014-11-18 LAB — COMPREHENSIVE METABOLIC PANEL
ALBUMIN: 2.4 g/dL — AB (ref 3.5–5.2)
ALT: 12 U/L (ref 0–53)
AST: 30 U/L (ref 0–37)
Alkaline Phosphatase: 76 U/L (ref 39–117)
BUN: 28 mg/dL — AB (ref 6–23)
CHLORIDE: 104 meq/L (ref 96–112)
CO2: 20 mEq/L (ref 19–32)
Calcium: 8.5 mg/dL (ref 8.4–10.5)
Creat: 2.52 mg/dL — ABNORMAL HIGH (ref 0.50–1.35)
GLUCOSE: 88 mg/dL (ref 70–99)
Potassium: 4.9 mEq/L (ref 3.5–5.3)
Sodium: 134 mEq/L — ABNORMAL LOW (ref 135–145)
TOTAL PROTEIN: 8.7 g/dL — AB (ref 6.0–8.3)
Total Bilirubin: 1.6 mg/dL — ABNORMAL HIGH (ref 0.2–1.2)

## 2014-11-18 LAB — CBC WITH DIFFERENTIAL/PLATELET
BASOS ABS: 0 10*3/uL (ref 0.0–0.1)
BASOS PCT: 0 % (ref 0–1)
Eosinophils Absolute: 0.1 10*3/uL (ref 0.0–0.7)
Eosinophils Relative: 1 % (ref 0–5)
HEMATOCRIT: 27 % — AB (ref 39.0–52.0)
Hemoglobin: 8.8 g/dL — ABNORMAL LOW (ref 13.0–17.0)
Lymphocytes Relative: 26 % (ref 12–46)
Lymphs Abs: 1.7 10*3/uL (ref 0.7–4.0)
MCH: 26.7 pg (ref 26.0–34.0)
MCHC: 32.6 g/dL (ref 30.0–36.0)
MCV: 81.8 fL (ref 78.0–100.0)
MONOS PCT: 18 % — AB (ref 3–12)
MPV: 10.7 fL (ref 8.6–12.4)
Monocytes Absolute: 1.2 10*3/uL — ABNORMAL HIGH (ref 0.1–1.0)
NEUTROS ABS: 3.6 10*3/uL (ref 1.7–7.7)
Neutrophils Relative %: 55 % (ref 43–77)
Platelets: 156 10*3/uL (ref 150–400)
RBC: 3.3 MIL/uL — ABNORMAL LOW (ref 4.22–5.81)
RDW: 17.2 % — ABNORMAL HIGH (ref 11.5–15.5)
WBC: 6.5 10*3/uL (ref 4.0–10.5)

## 2014-11-18 MED ORDER — LACTULOSE 10 GM/15ML PO SOLN: 30.0000 g | Freq: Two times a day (BID) | ORAL | Status: DC

## 2014-11-18 MED ORDER — ALBUMIN HUMAN 25 % IV SOLN: 25.0000 g | Freq: Once | INTRAVENOUS | Status: DC

## 2014-11-18 NOTE — Progress Notes (Signed)
Patient here to follow up RN noted wheezing but patient has no complaints No drinking, drugs, ETOH smoking according to patient

## 2014-11-18 NOTE — Patient Instructions (Signed)
Cirrhosis  Cirrhosis is a condition of scarring of the liver which is caused when the liver has tried repairing itself following damage. This damage may come from a previous infection such as one of the forms of hepatitis (usually hepatitis C), or the damage may come from being injured by toxins. The main toxin that causes this damage is alcohol. The scarring of the liver from use of alcohol is irreversible. That means the liver cannot return to normal even though alcohol is not used any more. The main danger of hepatitis C infection is that it may cause long-lasting (chronic) liver disease, and this also may lead to cirrhosis. This complication is progressive and irreversible.  CAUSES   Prior to available blood tests, hepatitis C could be contracted by blood transfusions. Since testing of blood has improved, this is now unlikely. This infection can also be contracted through intravenous drug use and the sharing of needles. It can also be contracted through sexual relationships. The injury caused by alcohol comes from too much use. It is not a few drinks that poison the liver, but years of misuse. Usually there will be some signs and symptoms early with scarring of the liver that suggest the development of better habits. Alcohol should never be used while using acetaminophen. A small dose of both taken together may cause irreversible damage to the liver.  HOME CARE INSTRUCTIONS   There is no specific treatment for cirrhosis. However, there are things you can do to avoid making the condition worse.  · Rest as needed.  · Eat a well-balanced diet. Your caregiver can help you with suggestions.  · Vitamin supplements including vitamins A, K, D, and thiamine can help.  · A low-salt diet, water restriction, or diuretic medicine may be needed to reduce fluid retention.  · Avoid alcohol. This can be extremely toxic if combined with acetaminophen.  · Avoid drugs which are toxic to the liver. Some of these include isoniazid,  methyldopa, acetaminophen, anabolic steroids (muscle-building drugs), erythromycin, and oral contraceptives (birth control pills). Check with your caregiver to make sure medicines you are presently taking will not be harmful.  · Periodic blood tests may be required. Follow your caregiver's advice regarding the timing of these.  · Milk thistle is an herbal remedy which does protect the liver against toxins. However, it will not help once the liver has been scarred.  SEEK MEDICAL CARE IF:  · You have increasing fatigue or weakness.  · You develop swelling of the hands, feet, legs, or face.  · You vomit bright red blood, or a coffee ground appearing material.  · You have blood in your stools, or the stools turn black and tarry.  · You have a fever.  · You develop loss of appetite, or have nausea and vomiting.  · You develop jaundice.  · You develop easy bruising or bleeding.  · You have worsening of any of the problems you are concerned about.  Document Released: 04/15/2005 Document Revised: 07/08/2011 Document Reviewed: 12/02/2007  [employer]® Patient Information ©2015 [employer]. This information is not intended to replace advice given to you by your health care provider. Make sure you discuss any questions you have with your health care provider.

## 2014-11-18 NOTE — Progress Notes (Signed)
Subjective:    Patient ID: John [name], male    DOB: 03-Jul-1955, 59 y.o.   MRN: [id number]  HPI John [name] is a 59 year old male with a history of HTN, Hepatitis C, Alcoholic Liver Cirrhosis, CHF recently hospitalized for decompensated liver cirrhosis at [facility] from 7/4-7/12/16. He had been diagnosed with liver cirrhosis 6 months prior to presentation and had a hospitalization in 08/2014 where he was treated for bleeding esophageal varices and hepatic encephalopathy. Chest x-ray showed increased pulmonary interstitium bilaterally, but no diffuse pulmonary edema. EKG was unremarkable, with normal troponin and mildly elevated BNP at 398 (>1000 at prior hospitalization), CMET- ALP- 67, ALT- 22, AST- 71, INR- 1.5. Alcohol level 6, UDS positive for Cocaine. On 11/01/2014, patient received ultrasound guided paracentesis with removal of approximately 5.3 liters of yellow ascitic fluid, with significant improvement in his breathing. (Beside paracentesis was not successful). Cell count and culture of ascitic fluid were not concerning for SBP. Patient was started on Lasix at 40 mg IV twice daily and spironolactone at 100 mg orally daily and doses of both gradually titrated up to IV lasix- 120am and 60pm and Spironolactone 400mg  daily, Coreg was also started and maintained at 6.25mg  BID. Patient's anasarca gradually improved, but his ascites returned and was resistant to high doses of IV lasix. Initially Cr improved from 3.25 to 2.02, and with increased diuretics there was a bump to 2.4. Patient was then switched to Per Oral Lasix and discharged home with Lasix- 120mg  daily and spironolactone 400mg  daily by mouth.   Interval history: He has been a bit more short of breath than at his last visit; has had some wheezing and thinks his abdomen has increased in size.He admits to being compliant with his medications and has not drunk alcohol for the last 3 months.   Past Medical History    Diagnosis Date  . Hypertension   . Chronic back pain   . Stroke 1998  . Polysubstance abuse   . Optic neuropathy, left   . CHF (congestive heart failure)   . Ascites   . ETOH abuse     Past Surgical History  Procedure Laterality Date  . None    . Esophagogastroduodenoscopy N/A 08/14/2014    Procedure: ESOPHAGOGASTRODUODENOSCOPY (EGD);  Surgeon: Ladene Artist, MD;  Location: [facility];  Service: Endoscopy;  Laterality: N/A;  . Radiology with anesthesia N/A 08/15/2014    Procedure: RADIOLOGY WITH ANESTHESIA;  Surgeon: Greggory Keen, MD;  Location: [place];  Service: Radiology;  Laterality: N/A;    History   Social History  . Marital Status: Single    Spouse Name: N/A  . Number of Children: N/A  . Years of Education: N/A   Occupational History  . Not on file.   Social History Main Topics  . Smoking status: Former Smoker -- 0.30 packs/day for 20 years    Types: Cigarettes  . Smokeless tobacco: Current User    Types: Snuff     Comment: 2015  . Alcohol Use: No     Comment: occ  . Drug Use: No     Comment: since fall 2015  . Sexual Activity: Not on file   Other Topics Concern  . Not on file   Social History Narrative   Lives with niece in a 2 story home.     On disability since 2015 for low back pain.  Used to work Statistician.    Highest level of education: 11th grade  Allergies  Allergen Reactions  . Penicillins     Convulsions    Current Outpatient Prescriptions on File Prior to Visit  Medication Sig Dispense Refill  . carvedilol (COREG) 6.25 MG tablet Take 1 tablet (6.25 mg total) by mouth 2 (two) times daily with a meal. 60 tablet 1  . ferrous sulfate 325 (65 FE) MG tablet Take 1 tablet (325 mg total) by mouth daily. 30 tablet 3  . furosemide (LASIX) 40 MG tablet Take 3 tablets (120 mg total) by mouth daily. 90 tablet 2  . pantoprazole (PROTONIX) 40 MG tablet Take 1 tablet (40 mg total) by mouth daily. 30 tablet 3  . spironolactone (ALDACTONE)  100 MG tablet Take 4 tablets (400 mg total) by mouth daily. 30 tablet 1  . folic acid (FOLVITE) 1 MG tablet Take 1 tablet (1 mg total) by mouth daily. (Patient not taking: Reported on 10/31/2014) 30 tablet 0  . rifaximin (XIFAXAN) 200 MG tablet Take 2 tablets (400 mg total) by mouth 3 (three) times daily. (Patient not taking: Reported on 10/31/2014) 180 tablet 0  . thiamine 50 MG tablet Take 10 tablets (500 mg total) by mouth daily. (Patient not taking: Reported on 11/11/2014) 30 tablet 0   No current facility-administered medications on file prior to visit.     Review of Systems  General: negative for fever, weight loss, appetite change Eyes: no visual symptoms. ENT: no ear symptoms, no sinus tenderness, no nasal congestion or sore throat. Neck: no pain  Respiratory: poisitve for wheezing, shortness of breath, no cough Cardiovascular: no chest pain, positive for dyspnea on exertion, no pedal edema, no orthopnea. Gastrointestinal: positive for abdominal pain, no diarrhea, no constipation Genito-Urinary: no urinary frequency, no dysuria, no polyuria. Hematologic: no bruising Endocrine: no cold or heat intolerance Neurological: no headaches, no seizures, no tremors Musculoskeletal: no joint pains, no joint swelling Skin: no pruritus, no rash. Psychological: no depression, no anxiety,       Objective: Filed Vitals:   11/18/14 1205  BP: 144/91  Pulse: 64  Temp: 98.1 F (36.7 C)  Height: 5\' 10"  (1.778 m)  Weight: 234 lb 9.6 oz (106.414 kg)  SpO2: 100%      Physical Exam  Constitutional: normal appearing,  Neck: normal range of motion, no thyromegaly, no JVD Cardiovascular: normal rate and rhythm, normal heart sounds, no murmurs, rub or gallop, no pedal edema Respiratory: clear to auscultation bilaterally, no wheezes, no rales, no rhonchi Abdomen: ascites, abdominal girth 53" Extremities: Full ROM, no tenderness in joints, i+ non pitting pedal edema in thighs Skin: scars from flea  bites on lower extremities. Neurological: alert, oriented x3, cranial nerves I-XII grossly intact , normal motor strength, normal sensation. Psychological: normal mood.        Assessment & Plan:  59 year old patient with a history of hypertension, alcoholic and hepatitis C liver cirrhosis, CHF recently managed for decompensated hepatitis C/alcoholic cirrhosis.  Hepatitis C/alcoholic cirrhosis with ascites: Child Pugh class C (9 points), MELD score -23 Increased abdominal girth- referred for abdominal paracentesis and albumin infusion.  Awaiting  Appointment with GI. Continue current medications and I have refilled pantoprazole for him. He continues to abstain from alcohol use and I have commended him on this.  Hypertension: Controlled. Continue medications.   Anemia: Continue ferrous sulfate; last hgb was 8.8 CBC today.  Thrombocytopenia: Multifactorial etiology, except on discharge was 46 We'll repeat a CBC today  CKD: Referral to Nephrology; last Cr was 2.61 CMET today to evaluate renal function  This note has been created with Surveyor, quantity. Any transcriptional errors are unintentional.

## 2014-11-22 ENCOUNTER — Encounter (HOSPITAL_COMMUNITY): Payer: Self-pay

## 2014-11-22 ENCOUNTER — Encounter (HOSPITAL_COMMUNITY)
Admission: RE | Admit: 2014-11-22 | Discharge: 2014-11-22 | Disposition: A | Discharge status: Home / Self Care | Payer: Medicaid Other | Source: Ambulatory Visit | Attending: Family Medicine | Admitting: Family Medicine

## 2014-11-22 ENCOUNTER — Ambulatory Visit (HOSPITAL_COMMUNITY)
Admission: RE | Admit: 2014-11-22 | Discharge: 2014-11-22 | Disposition: A | Discharge status: Home / Self Care | Payer: Medicaid Other | Source: Ambulatory Visit | Attending: Family Medicine | Admitting: Family Medicine

## 2014-11-22 DIAGNOSIS — R188 Other ascites: Secondary | ICD-10-CM | POA: Insufficient documentation

## 2014-11-22 DIAGNOSIS — K7031 Alcoholic cirrhosis of liver with ascites: Secondary | ICD-10-CM

## 2014-11-22 MED ORDER — ALBUMIN HUMAN 25 % IV SOLN
25.0000 g | Freq: Once | INTRAVENOUS | Status: AC
  Administered 2014-11-22: 25 g via INTRAVENOUS
  Filled 2014-11-22: qty 100

## 2014-11-22 MED ORDER — SODIUM CHLORIDE 0.9 % IV SOLN
INTRAVENOUS | Status: DC
  Administered 2014-11-22: 11:00:00 via INTRAVENOUS

## 2014-11-22 NOTE — Discharge Instructions (Signed)
° ° ° ° ° ° ° ° ° ° ° ° ° ° ° ° ° ° ° ° ° ° ° ° ° ° ° ° ° ° ° ° ° ° ° ° ° ° ° ° ° ° ° ° ° ° ° ° ° ° ° ° ° ° ° ° ° ° ° ° ° ° ° ° ° ° ° ° ° ° ° ° ° ° ° ° ° ° ° ° ° ° ° ° ° ° ° ° ° ° ° ° ° ° ° ° ° ° ° ° ° ° ° ° °  Albumin °Albumin tests are done as a screen for a liver disorder or kidney disease or to check nutritional status, especially in hospitalized patients (prealbumin is sometimes used instead of albumin in this situation). °Albumin is the most plentiful protein in the blood plasma. It keeps fluid from leaking out of blood vessels; nourishes tissues; and transports hormones, vitamins, drugs, and ions like calcium throughout the body. Albumin is made in the liver and is extremely sensitive to liver damage. The concentration of albumin drops when the liver is damaged, with kidney disease (nephrotic syndrome), when a person is malnourished, if a person experiences inflammation in the body, or with shock. Albumin increases when a person is dehydrated. °PREPARATION FOR TEST °No preparation or fasting is necessary. A blood sample is taken by a needle from a vein. Tell the person doing the test if you are pregnant. °NORMAL FINDINGS  °Adult/Elderly °· Total Protein: 6.4-8.3 g/dL or 64-83g/L (SI units) °· Albumin; 3.5-5 g/dL or 35-50 g/L (SI units) °· Globulin 2.3-3.4 g/dL °¨ Alpha1 globulin: 0.1-3 g/dL or 1-3 g/L (SI units) °¨ Alpha2 globulin: 0.6-1 g/dL or6-10 g/L (SI units) °¨ Beta globulin: 0.7-1.1 g/dL or 7-11 g/L (SI units) °Children °· Total protein. °¨ Premature infant: 4.2-7.6 g/L °¨ Newborn: 4.6-7.4 g/dL °¨ Infant: 6-6.7 g/L °· Albumin. °¨ Premature infant: 3-4.2 g/dL °¨ Newborn: 3.5-5.4 g/dL °¨ Infant: 4.4-5.4 g/dL °¨ Child: 4-5.9 g/dL °Ranges for normal findings may vary among different laboratories and hospitals. You should always check with your doctor after having lab work or other tests done to discuss the meaning of your test results and whether your values are considered within normal limits. °MEANING OF  TEST  °Your caregiver will go over the test results with you and discuss the importance and meaning of your results, as well as treatment options and the need for additional tests if necessary. °OBTAINING THE TEST RESULTS °It is your responsibility to obtain your test results. Ask the lab or department performing the test when and how you will get your results. °Document Released: 05/07/2004 Document Revised: 07/08/2011 Document Reviewed: 03/20/2008 °[employer]® Patient Information ©2015 [employer]. This information is not intended to replace advice given to you by your health care provider. Make sure you discuss any questions you have with your health care provider. ° °

## 2014-11-22 NOTE — Procedures (Signed)
US guided therapeutic paracentesis performed yielding 4 liters (maximum ordered) light yellow fluid. No immediate complications. The pt will receive IV albumin postprocedure.

## 2014-11-23 ENCOUNTER — Telehealth: Payer: Self-pay | Admitting: Family Medicine

## 2014-11-23 NOTE — Telephone Encounter (Signed)
Called patient and patient verified date of birth.  Patient informed that his renal function still shows a decline and he is still anemic.  Patient instructed to continue taking ferrous sulfate as prescribed, and to keep appointment with Nephrology and GI.  Patient voiced understanding.  Patient aware of follow up appointment with Dr. Jarold Song 12/09/14.

## 2014-11-25 ENCOUNTER — Encounter: Payer: Self-pay | Admitting: Internal Medicine

## 2014-12-05 ENCOUNTER — Encounter: Payer: Self-pay | Admitting: Internal Medicine

## 2014-12-09 ENCOUNTER — Ambulatory Visit: Payer: Medicaid Other | Admitting: Family Medicine

## 2014-12-16 ENCOUNTER — Other Ambulatory Visit: Payer: Self-pay | Admitting: Nephrology

## 2014-12-16 DIAGNOSIS — N184 Chronic kidney disease, stage 4 (severe): Secondary | ICD-10-CM

## 2014-12-22 ENCOUNTER — Other Ambulatory Visit: Payer: Medicaid Other

## 2014-12-27 ENCOUNTER — Emergency Department (HOSPITAL_COMMUNITY)
Admission: EM | Admit: 2014-12-27 | Discharge: 2014-12-27 | Disposition: A | Discharge status: Home / Self Care | Payer: Medicaid Other | Attending: Emergency Medicine | Admitting: Emergency Medicine

## 2014-12-27 ENCOUNTER — Emergency Department (HOSPITAL_COMMUNITY): Payer: Medicaid Other

## 2014-12-27 ENCOUNTER — Encounter (HOSPITAL_COMMUNITY): Payer: Self-pay

## 2014-12-27 ENCOUNTER — Ambulatory Visit
Admission: RE | Admit: 2014-12-27 | Discharge: 2014-12-27 | Disposition: A | Discharge status: Home / Self Care | Payer: Medicaid Other | Source: Ambulatory Visit | Attending: Nephrology | Admitting: Nephrology

## 2014-12-27 DIAGNOSIS — I509 Heart failure, unspecified: Secondary | ICD-10-CM | POA: Diagnosis not present

## 2014-12-27 DIAGNOSIS — Z8673 Personal history of transient ischemic attack (TIA), and cerebral infarction without residual deficits: Secondary | ICD-10-CM | POA: Diagnosis not present

## 2014-12-27 DIAGNOSIS — S93401A Sprain of unspecified ligament of right ankle, initial encounter: Secondary | ICD-10-CM

## 2014-12-27 DIAGNOSIS — Z880 Allergy status to penicillin: Secondary | ICD-10-CM | POA: Insufficient documentation

## 2014-12-27 DIAGNOSIS — Z87891 Personal history of nicotine dependence: Secondary | ICD-10-CM | POA: Insufficient documentation

## 2014-12-27 DIAGNOSIS — I10 Essential (primary) hypertension: Secondary | ICD-10-CM | POA: Diagnosis not present

## 2014-12-27 DIAGNOSIS — Z79899 Other long term (current) drug therapy: Secondary | ICD-10-CM | POA: Insufficient documentation

## 2014-12-27 DIAGNOSIS — X58XXXA Exposure to other specified factors, initial encounter: Secondary | ICD-10-CM | POA: Insufficient documentation

## 2014-12-27 DIAGNOSIS — Y939 Activity, unspecified: Secondary | ICD-10-CM | POA: Diagnosis not present

## 2014-12-27 DIAGNOSIS — Y9289 Other specified places as the place of occurrence of the external cause: Secondary | ICD-10-CM | POA: Diagnosis not present

## 2014-12-27 DIAGNOSIS — G8929 Other chronic pain: Secondary | ICD-10-CM | POA: Insufficient documentation

## 2014-12-27 DIAGNOSIS — Y998 Other external cause status: Secondary | ICD-10-CM | POA: Diagnosis not present

## 2014-12-27 DIAGNOSIS — N184 Chronic kidney disease, stage 4 (severe): Secondary | ICD-10-CM

## 2014-12-27 DIAGNOSIS — S99911A Unspecified injury of right ankle, initial encounter: Secondary | ICD-10-CM | POA: Diagnosis present

## 2014-12-27 MED ORDER — CAPSAICIN-MENTHOL-METHYL SAL 0.025-1-12 % EX CREA: 2.0000 g | TOPICAL_CREAM | Freq: Two times a day (BID) | CUTANEOUS | Status: DC | PRN

## 2014-12-27 MED ORDER — DICLOFENAC SODIUM 1 % TD GEL: 2.0000 g | Freq: Four times a day (QID) | TRANSDERMAL | Status: DC

## 2014-12-27 NOTE — ED Provider Notes (Signed)
CSN: [id number]     Arrival date & time 12/27/14  4098 History   First MD Initiated Contact with Patient 12/27/14 [phone]     Chief Complaint  Patient presents with  . Ankle Pain     (Consider location/radiation/quality/duration/timing/severity/associated sxs/prior Treatment) Patient is a 59 y.o. male presenting with ankle pain. The history is provided by the patient.  Ankle Pain Location:  Ankle Time since incident:  1 day Injury: yes   Mechanism of injury comment:  Twisting Ankle location:  R ankle Pain details:    Quality:  Aching   Radiates to:  Does not radiate   Severity:  Moderate   Onset quality:  Sudden   Duration:  1 day   Timing:  Constant   Progression:  Unchanged Chronicity:  New Dislocation: no   Foreign body present:  No foreign bodies Prior injury to area:  No Relieved by:  Nothing Worsened by:  Bearing weight and rotation Ineffective treatments:  None tried Associated symptoms: stiffness and swelling   Associated symptoms: no fever and no numbness     Past Medical History  Diagnosis Date  . Hypertension   . Chronic back pain   . Stroke 1998  . Polysubstance abuse   . Optic neuropathy, left   . CHF (congestive heart failure)   . Ascites   . ETOH abuse    Past Surgical History  Procedure Laterality Date  . None    . Esophagogastroduodenoscopy N/A 08/14/2014    Procedure: ESOPHAGOGASTRODUODENOSCOPY (EGD);  Surgeon: Ladene Artist, MD;  Location: [facility];  Service: Endoscopy;  Laterality: N/A;  . Radiology with anesthesia N/A 08/15/2014    Procedure: RADIOLOGY WITH ANESTHESIA;  Surgeon: Greggory Keen, MD;  Location: [facility];  Service: Radiology;  Laterality: N/A;   Family History  Problem Relation Age of Onset  . Hypertension Mother     Living  . Hypertension Brother   . Kidney disease Mother   . Kidney disease Brother   . Hypertension Father   . Hypertension Father     Deceased, 60  . Hypertension Sister   . Ulcers Father    Social  History  Substance Use Topics  . Smoking status: Former Smoker -- 0.30 packs/day for 20 years    Types: Cigarettes  . Smokeless tobacco: Current User    Types: Snuff     Comment: 2015  . Alcohol Use: No     Comment: occ    Review of Systems  Constitutional: Negative for fever.  Musculoskeletal: Positive for stiffness.  All other systems reviewed and are negative.     Allergies  Penicillins  Home Medications   Prior to Admission medications   Medication Sig Start Date End Date Taking? Authorizing Provider  albumin human 25 % bottle Inject 100 mLs (25 g total) into the vein once. 11/18/14   Arnoldo Morale, MD  carvedilol (COREG) 6.25 MG tablet Take 1 tablet (6.25 mg total) by mouth 2 (two) times daily with a meal. 11/08/14   Ejiroghene E Emokpae, MD  ferrous sulfate 325 (65 FE) MG tablet Take 1 tablet (325 mg total) by mouth daily. 11/11/14   Arnoldo Morale, MD  folic acid (FOLVITE) 1 MG tablet Take 1 tablet (1 mg total) by mouth daily. Patient not taking: Reported on 10/31/2014 09/02/14   Allie Bossier, MD  furosemide (LASIX) 40 MG tablet Take 3 tablets (120 mg total) by mouth daily. 11/08/14   Ejiroghene Arlyce Dice, MD  lactulose (CHRONULAC) 10 GM/15ML solution  Take 45 mLs (30 g total) by mouth 2 (two) times daily. 11/18/14   Arnoldo Morale, MD  pantoprazole (PROTONIX) 40 MG tablet Take 1 tablet (40 mg total) by mouth daily. 11/11/14   Arnoldo Morale, MD  rifaximin (XIFAXAN) 200 MG tablet Take 2 tablets (400 mg total) by mouth 3 (three) times daily. Patient not taking: Reported on 10/31/2014 09/02/14   Allie Bossier, MD  spironolactone (ALDACTONE) 100 MG tablet Take 4 tablets (400 mg total) by mouth daily. 11/08/14   Ejiroghene Arlyce Dice, MD  thiamine 50 MG tablet Take 10 tablets (500 mg total) by mouth daily. Patient not taking: Reported on 11/11/2014 11/08/14   Ejiroghene E Emokpae, MD   BP 165/101 mmHg  Pulse 72  Temp(Src) 98.1 F (36.7 C) (Oral)  Resp 16  SpO2 96% Physical Exam   Constitutional: He is oriented to person, place, and time. He appears well-developed and well-nourished. No distress.  HENT:  Head: Normocephalic and atraumatic.  Eyes: Conjunctivae are normal.  Neck: Neck supple. No tracheal deviation present.  Cardiovascular: Normal rate and regular rhythm.   Pulmonary/Chest: Effort normal. No respiratory distress.  Abdominal: Soft. He exhibits no distension.  Musculoskeletal:       Right ankle: He exhibits decreased range of motion (2/2 pain). He exhibits no deformity and normal pulse. Tenderness. Lateral malleolus and medial malleolus tenderness found. No AITFL and no head of 5th metatarsal tenderness found. Achilles tendon exhibits no pain and no defect.  Neurological: He is alert and oriented to person, place, and time.  Skin: Skin is warm and dry.  Psychiatric: He has a normal mood and affect.    ED Course  Procedures (including critical care time) Labs Review Labs Reviewed - No data to display  Imaging Review Dg Ankle 2 Views Right  12/27/2014   CLINICAL DATA:  Twisted right ankle leaving family dollar. Initial encounter.  EXAM: RIGHT ANKLE - 2 VIEW  COMPARISON:  None.  FINDINGS: Mild lateral swelling and possible small ankle joint effusion. No visible fracture or dislocation. Ossicles or remote fragmentation noted at the medial malleolus.  IMPRESSION: Probable small ankle joint effusion.  No acute osseous finding.   Electronically Signed   By: Monte Fantasia M.D.   On: 12/27/2014 10:22   I have personally reviewed and evaluated these images and lab results as part of my medical decision-making.   EKG Interpretation None      MDM   Final diagnoses:  Right ankle sprain, initial encounter    59 year old male presents after twisting his right ankle yesterday and having pain with weightbearing, active range of motion, and with malleoli tenderness. No fracture identified on plain film, small effusion noted with likely clinical moderate  grade ankle sprain. Patient has multiple medical comorbidities and is a poor candidate for Tylenol containing compounds or NSAIDs systemically. I discussed the use of topical agents for local relief with Voltaren gel or capsaicin if his insurance will not cover this. Pt given instructions for supportive care including rest, ice, compression, and elevation to help alleviate symptoms.     Leo Grosser, MD 12/27/14 [phone]

## 2014-12-27 NOTE — ED Notes (Signed)
Patient transported to X-ray 

## 2014-12-27 NOTE — Discharge Instructions (Signed)

## 2014-12-27 NOTE — ED Notes (Signed)
Pt is back from XRAY.

## 2014-12-27 NOTE — ED Notes (Signed)
Pt states he stepped wrong yesterday and twisted right ankle. Pt able to bear weight on right ankle/foot. Pulses intact, extremity warm & dry. No deformity noted.

## 2014-12-27 NOTE — Progress Notes (Signed)
Orthopedic Tech Progress Note Patient Details:  John [name] [birth year] [id number]  Ortho Devices Type of Ortho Device: Ankle Air splint, Crutches Ortho Device/Splint Location: rle Ortho Device/Splint Interventions: Application   Deziya Amero 12/27/2014, 11:54 AM

## 2015-01-27 ENCOUNTER — Encounter: Payer: Self-pay | Admitting: Gastroenterology

## 2015-02-01 ENCOUNTER — Ambulatory Visit: Payer: Medicaid Other | Admitting: Internal Medicine

## 2015-02-14 ENCOUNTER — Other Ambulatory Visit (HOSPITAL_COMMUNITY): Payer: Self-pay | Admitting: *Deleted

## 2015-02-14 NOTE — Discharge Instructions (Signed)
Epoetin Alfa injection °What is this medicine? °EPOETIN ALFA (e POE e tin AL fa) helps your body make more red blood cells. This medicine is used to treat anemia caused by chronic kidney failure, cancer chemotherapy, or HIV-therapy. It may also be used before surgery if you have anemia. °This medicine may be used for other purposes; ask your health care provider or pharmacist if you have questions. °What should I tell my health care provider before I take this medicine? °They need to know if you have any of these conditions: °-blood clotting disorders °-cancer patient not on chemotherapy °-cystic fibrosis °-heart disease, such as angina or heart failure °-hemoglobin level of 12 g/dL or greater °-high blood pressure °-low levels of folate, iron, or vitamin B12 °-seizures °-an unusual or allergic reaction to erythropoietin, albumin, benzyl alcohol, hamster proteins, other medicines, foods, dyes, or preservatives °-pregnant or trying to get pregnant °-breast-feeding °How should I use this medicine? °This medicine is for injection into a vein or under the skin. It is usually given by a health care professional in a hospital or clinic setting. °If you get this medicine at home, you will be taught how to prepare and give this medicine. Use exactly as directed. Take your medicine at regular intervals. Do not take your medicine more often than directed. °It is important that you put your used needles and syringes in a special sharps container. Do not put them in a trash can. If you do not have a sharps container, call your pharmacist or healthcare provider to get one. °Talk to your pediatrician regarding the use of this medicine in children. While this drug may be prescribed for selected conditions, precautions do apply. °Overdosage: If you think you have taken too much of this medicine contact a poison control center or emergency room at once. °NOTE: This medicine is only for you. Do not share this medicine with  others. °What if I miss a dose? °If you miss a dose, take it as soon as you can. If it is almost time for your next dose, take only that dose. Do not take double or extra doses. °What may interact with this medicine? °Do not take this medicine with any of the following medications: °-darbepoetin alfa °This list may not describe all possible interactions. Give your health care provider a list of all the medicines, herbs, non-prescription drugs, or dietary supplements you use. Also tell them if you smoke, drink alcohol, or use illegal drugs. Some items may interact with your medicine. °What should I watch for while using this medicine? °Visit your prescriber or health care professional for regular checks on your progress and for the needed blood tests and blood pressure measurements. It is especially important for the doctor to make sure your hemoglobin level is in the desired range, to limit the risk of potential side effects and to give you the best benefit. Keep all appointments for any recommended tests. Check your blood pressure as directed. Ask your doctor what your blood pressure should be and when you should contact him or her. °As your body makes more red blood cells, you may need to take iron, folic acid, or vitamin B supplements. Ask your doctor or health care provider which products are right for you. If you have kidney disease continue dietary restrictions, even though this medication can make you feel better. Talk with your doctor or health care professional about the foods you eat and the vitamins that you take. °What side effects may I notice   from receiving this medicine? °Side effects that you should report to your doctor or health care professional as soon as possible: °-allergic reactions like skin rash, itching or hives, swelling of the face, lips, or tongue °-breathing problems °-changes in vision °-chest pain °-confusion, trouble speaking or understanding °-feeling faint or lightheaded,  falls °-high blood pressure °-muscle aches or pains °-pain, swelling, warmth in the leg °-rapid weight gain °-severe headaches °-sudden numbness or weakness of the face, arm or leg °-trouble walking, dizziness, loss of balance or coordination °-seizures (convulsions) °-swelling of the ankles, feet, hands °-unusually weak or tired °Side effects that usually do not require medical attention (report to your doctor or health care professional if they continue or are bothersome): °-diarrhea °-fever, chills (flu-like symptoms) °-headaches °-nausea, vomiting °-redness, stinging, or swelling at site where injected °This list may not describe all possible side effects. Call your doctor for medical advice about side effects. You may report side effects to FDA at 1-800-FDA-1088. °Where should I keep my medicine? °Keep out of the reach of children. °Store in a refrigerator between 2 and 8 degrees C (36 and 46 degrees F). Do not freeze or shake. Throw away any unused portion if using a single-dose vial. Multi-dose vials can be kept in the refrigerator for up to 21 days after the initial dose. Throw away unused medicine. °NOTE: This sheet is a summary. It may not cover all possible information. If you have questions about this medicine, talk to your doctor, pharmacist, or health care provider. °  °© 2016, [employer]. (2008-03-29 10:25:44) °Ferumoxytol injection °What is this medicine? °FERUMOXYTOL is an iron complex. Iron is used to make healthy red blood cells, which carry oxygen and nutrients throughout the body. This medicine is used to treat iron deficiency anemia in people with chronic kidney disease. °This medicine may be used for other purposes; ask your health care provider or pharmacist if you have questions. °What should I tell my health care provider before I take this medicine? °They need to know if you have any of these conditions: °-anemia not caused by low iron levels °-high levels of iron in the  blood °-magnetic resonance imaging (MRI) test scheduled °-an unusual or allergic reaction to iron, other medicines, foods, dyes, or preservatives °-pregnant or trying to get pregnant °-breast-feeding °How should I use this medicine? °This medicine is for injection into a vein. It is given by a health care professional in a hospital or clinic setting. °Talk to your pediatrician regarding the use of this medicine in children. Special care may be needed. °Overdosage: If you think you have taken too much of this medicine contact a poison control center or emergency room at once. °NOTE: This medicine is only for you. Do not share this medicine with others. °What if I miss a dose? °It is important not to miss your dose. Call your doctor or health care professional if you are unable to keep an appointment. °What may interact with this medicine? °This medicine may interact with the following medications: °-other iron products °This list may not describe all possible interactions. Give your health care provider a list of all the medicines, herbs, non-prescription drugs, or dietary supplements you use. Also tell them if you smoke, drink alcohol, or use illegal drugs. Some items may interact with your medicine. °What should I watch for while using this medicine? °Visit your doctor or healthcare professional regularly. Tell your doctor or healthcare professional if your symptoms do not start to get better   or if they get worse. You may need blood work done while you are taking this medicine. °You may need to follow a special diet. Talk to your doctor. Foods that contain iron include: whole grains/cereals, dried fruits, beans, or peas, leafy green vegetables, and organ meats (liver, kidney). °What side effects may I notice from receiving this medicine? °Side effects that you should report to your doctor or health care professional as soon as possible: °-allergic reactions like skin rash, itching or hives, swelling of the face,  lips, or tongue °-breathing problems °-changes in blood pressure °-feeling faint or lightheaded, falls °-fever or chills °-flushing, sweating, or hot feelings °-swelling of the ankles or feet °Side effects that usually do not require medical attention (Report these to your doctor or health care professional if they continue or are bothersome.): °-diarrhea °-headache °-nausea, vomiting °-stomach pain °This list may not describe all possible side effects. Call your doctor for medical advice about side effects. You may report side effects to FDA at 1-800-FDA-1088. °Where should I keep my medicine? °This drug is given in a hospital or clinic and will not be stored at home. °NOTE: This sheet is a summary. It may not cover all possible information. If you have questions about this medicine, talk to your doctor, pharmacist, or health care provider. °  °© 2016, [employer]/Gold Standard. (2011-11-29 15:23:36) ° °

## 2015-02-15 ENCOUNTER — Encounter (HOSPITAL_COMMUNITY)
Admission: RE | Admit: 2015-02-15 | Discharge: 2015-02-15 | Disposition: A | Discharge status: Home / Self Care | Payer: Medicaid Other | Source: Ambulatory Visit | Attending: Nephrology | Admitting: Nephrology

## 2015-02-15 DIAGNOSIS — N184 Chronic kidney disease, stage 4 (severe): Secondary | ICD-10-CM | POA: Insufficient documentation

## 2015-02-15 DIAGNOSIS — Z5181 Encounter for therapeutic drug level monitoring: Secondary | ICD-10-CM | POA: Diagnosis not present

## 2015-02-15 DIAGNOSIS — D631 Anemia in chronic kidney disease: Secondary | ICD-10-CM | POA: Diagnosis not present

## 2015-02-15 DIAGNOSIS — Z79899 Other long term (current) drug therapy: Secondary | ICD-10-CM | POA: Diagnosis not present

## 2015-02-15 DIAGNOSIS — D509 Iron deficiency anemia, unspecified: Secondary | ICD-10-CM | POA: Insufficient documentation

## 2015-02-15 LAB — POCT HEMOGLOBIN-HEMACUE: Hemoglobin: 7.8 g/dL — ABNORMAL LOW (ref 13.0–17.0)

## 2015-02-15 MED ORDER — SODIUM CHLORIDE 0.9 % IV SOLN
510.0000 mg | INTRAVENOUS | Status: DC
  Administered 2015-02-15: 510 mg via INTRAVENOUS
  Filled 2015-02-15: qty 17

## 2015-02-15 MED ORDER — EPOETIN ALFA 10000 UNIT/ML IJ SOLN: 5000.0000 [IU] | INTRAMUSCULAR | Status: DC

## 2015-02-15 MED ORDER — EPOETIN ALFA 10000 UNIT/ML IJ SOLN
INTRAMUSCULAR | Status: AC
  Administered 2015-02-15: 5000 [IU] via SUBCUTANEOUS
  Filled 2015-02-15: qty 1

## 2015-02-22 ENCOUNTER — Ambulatory Visit (HOSPITAL_COMMUNITY)
Admission: RE | Admit: 2015-02-22 | Discharge: 2015-02-22 | Disposition: A | Discharge status: Home / Self Care | Payer: Medicaid Other | Source: Ambulatory Visit | Attending: Nephrology | Admitting: Nephrology

## 2015-02-22 DIAGNOSIS — D509 Iron deficiency anemia, unspecified: Secondary | ICD-10-CM | POA: Diagnosis not present

## 2015-02-22 DIAGNOSIS — Z79899 Other long term (current) drug therapy: Secondary | ICD-10-CM | POA: Diagnosis not present

## 2015-02-22 DIAGNOSIS — D631 Anemia in chronic kidney disease: Secondary | ICD-10-CM | POA: Insufficient documentation

## 2015-02-22 DIAGNOSIS — N184 Chronic kidney disease, stage 4 (severe): Secondary | ICD-10-CM | POA: Insufficient documentation

## 2015-02-22 DIAGNOSIS — Z5181 Encounter for therapeutic drug level monitoring: Secondary | ICD-10-CM | POA: Insufficient documentation

## 2015-02-22 LAB — POCT HEMOGLOBIN-HEMACUE: Hemoglobin: 9 g/dL — ABNORMAL LOW (ref 13.0–17.0)

## 2015-02-22 MED ORDER — EPOETIN ALFA 10000 UNIT/ML IJ SOLN
5000.0000 [IU] | INTRAMUSCULAR | Status: DC
  Administered 2015-02-22: 5000 [IU] via SUBCUTANEOUS

## 2015-02-22 MED ORDER — EPOETIN ALFA 10000 UNIT/ML IJ SOLN
INTRAMUSCULAR | Status: AC
  Administered 2015-02-22: 5000 [IU] via SUBCUTANEOUS
  Filled 2015-02-22: qty 1

## 2015-02-22 MED ORDER — SODIUM CHLORIDE 0.9 % IV SOLN
510.0000 mg | INTRAVENOUS | Status: AC
  Administered 2015-02-22: 510 mg via INTRAVENOUS
  Filled 2015-02-22: qty 17

## 2015-03-01 ENCOUNTER — Ambulatory Visit (HOSPITAL_COMMUNITY)
Admission: RE | Admit: 2015-03-01 | Discharge: 2015-03-01 | Disposition: A | Discharge status: Home / Self Care | Payer: Medicaid Other | Source: Ambulatory Visit | Attending: Nephrology | Admitting: Nephrology

## 2015-03-01 DIAGNOSIS — N184 Chronic kidney disease, stage 4 (severe): Secondary | ICD-10-CM | POA: Diagnosis not present

## 2015-03-01 DIAGNOSIS — D631 Anemia in chronic kidney disease: Secondary | ICD-10-CM | POA: Insufficient documentation

## 2015-03-01 DIAGNOSIS — Z79899 Other long term (current) drug therapy: Secondary | ICD-10-CM | POA: Diagnosis not present

## 2015-03-01 DIAGNOSIS — Z5181 Encounter for therapeutic drug level monitoring: Secondary | ICD-10-CM | POA: Insufficient documentation

## 2015-03-01 LAB — POCT HEMOGLOBIN-HEMACUE: Hemoglobin: 11 g/dL — ABNORMAL LOW (ref 13.0–17.0)

## 2015-03-01 MED ORDER — EPOETIN ALFA 10000 UNIT/ML IJ SOLN
INTRAMUSCULAR | Status: AC
  Administered 2015-03-01: 5000 [IU] via SUBCUTANEOUS
  Filled 2015-03-01: qty 1

## 2015-03-01 MED ORDER — EPOETIN ALFA 10000 UNIT/ML IJ SOLN: 5000.0000 [IU] | INTRAMUSCULAR | Status: DC

## 2015-03-08 ENCOUNTER — Encounter (HOSPITAL_COMMUNITY)
Admission: RE | Admit: 2015-03-08 | Discharge: 2015-03-08 | Disposition: A | Discharge status: Home / Self Care | Payer: Medicaid Other | Source: Ambulatory Visit | Attending: Nephrology | Admitting: Nephrology

## 2015-03-08 DIAGNOSIS — D631 Anemia in chronic kidney disease: Secondary | ICD-10-CM | POA: Diagnosis not present

## 2015-03-08 DIAGNOSIS — D509 Iron deficiency anemia, unspecified: Secondary | ICD-10-CM | POA: Insufficient documentation

## 2015-03-08 DIAGNOSIS — Z5181 Encounter for therapeutic drug level monitoring: Secondary | ICD-10-CM | POA: Insufficient documentation

## 2015-03-08 DIAGNOSIS — N184 Chronic kidney disease, stage 4 (severe): Secondary | ICD-10-CM | POA: Insufficient documentation

## 2015-03-08 DIAGNOSIS — Z79899 Other long term (current) drug therapy: Secondary | ICD-10-CM | POA: Diagnosis not present

## 2015-03-08 LAB — POCT HEMOGLOBIN-HEMACUE: HEMOGLOBIN: 10.3 g/dL — AB (ref 13.0–17.0)

## 2015-03-08 LAB — IRON AND TIBC
Iron: 62 ug/dL (ref 45–182)
Saturation Ratios: 21 % (ref 17.9–39.5)
TIBC: 290 ug/dL (ref 250–450)
UIBC: 228 ug/dL

## 2015-03-08 LAB — FERRITIN: FERRITIN: 378 ng/mL — AB (ref 24–336)

## 2015-03-08 MED ORDER — EPOETIN ALFA 10000 UNIT/ML IJ SOLN
INTRAMUSCULAR | Status: AC
  Filled 2015-03-08: qty 1

## 2015-03-08 MED ORDER — EPOETIN ALFA 10000 UNIT/ML IJ SOLN
5000.0000 [IU] | INTRAMUSCULAR | Status: DC
  Administered 2015-03-08: 5000 [IU] via SUBCUTANEOUS

## 2015-03-14 ENCOUNTER — Encounter: Payer: Self-pay | Admitting: Gastroenterology

## 2015-03-14 ENCOUNTER — Ambulatory Visit (INDEPENDENT_AMBULATORY_CARE_PROVIDER_SITE_OTHER): Payer: Medicaid Other | Admitting: Gastroenterology

## 2015-03-14 VITALS — BP 148/96 | HR 68 | Ht 70.0 in | Wt 189.6 lb

## 2015-03-14 DIAGNOSIS — I864 Gastric varices: Secondary | ICD-10-CM

## 2015-03-14 DIAGNOSIS — K7031 Alcoholic cirrhosis of liver with ascites: Secondary | ICD-10-CM | POA: Diagnosis not present

## 2015-03-14 NOTE — Patient Instructions (Signed)
Continue your Lasix 40 mg twice daily and Lactulose twice daily.   Ongoing follow up with your primary care physician.   Thank you for choosing me and [facility].  Pricilla Riffle. Dagoberto Ligas., MD., [facility]

## 2015-03-14 NOTE — Progress Notes (Signed)
    History of Present Illness: This is a 59 year old male with a history of HTN, Hepatitis C, alcoholic cirrhosis, CHF and substance abuse, last hospitalized for CHF, decompensated cirrhosis and anasarca at [facility] in July 2016. Paracentesis with 5.3 liters removed. Diurectics adjusted. Coreg was started.  He was diagnosed with cirrhosis likely due to alcohol and HCV 6 months ago during hospitalization in May 2016 where he was treated for bleeding gastric varices and hepatic encephalopathy. Gastric varices treated with BRTO. He was not a TIPS candidate at  The time due to a high MELD. No esophageal varices on EGD.  He is accompanied by his sister. Pt and his sister relate that he is followed [facility] and they have been managing cirrhosis, ascites, anemia and other medical problems. He is followed by Dr. Justin Mend for CKD. Patient relates he has not used any alcohol for several months however he uses marijuana and cocaine. He has had substantial improvement in his ascites and edema with a very good slow, steady weight loss. He has some mild abdominal distention. No other GI complaints.  Current Medications, Allergies, Past Medical History, Past Surgical History, Family History and Social History were reviewed in [employer] record.  Physical Exam: General: Well developed, well nourished, no acute distress Head: Normocephalic and atraumatic Eyes:  sclerae anicteric, EOMI Ears: Normal auditory acuity Mouth: No deformity or lesions Lungs: Clear throughout to auscultation Heart: Regular rate and rhythm; no murmurs, rubs or bruits Abdomen: Soft, non tender and mildly distended. No masses, hepatosplenomegaly or hernias noted. Normal Bowel sounds Musculoskeletal: Symmetrical with no gross deformities  Pulses:  Normal pulses noted Extremities: No clubbing, cyanosis, edema or deformities noted Neurological: Alert oriented x 4, grossly  nonfocal Psychological:  Alert and cooperative. Normal mood and affect  Assessment and Recommendations:  1. Cirrhosis likely from alcohol and HCV with portal hypertension, bleeding gastric varices post BRTO, ascites, CKD, thrombocytopenia, CHF, anemia and substance abuse. Ascites has steadily improved with decreased dosing of diuretics. Continue Lasix 40 mg po bid and continue lactulose 30 g bid. Monitor daily weights at home. Follow low Na diet. Avoid all etoh, marijuana and cocaine usage. Follow up with PCP and Dr. Justin Mend for ongoing mgmt.

## 2015-03-15 ENCOUNTER — Encounter (HOSPITAL_COMMUNITY)
Admission: RE | Admit: 2015-03-15 | Discharge: 2015-03-15 | Disposition: A | Discharge status: Home / Self Care | Payer: Medicaid Other | Source: Ambulatory Visit | Attending: Nephrology | Admitting: Nephrology

## 2015-03-15 DIAGNOSIS — N184 Chronic kidney disease, stage 4 (severe): Secondary | ICD-10-CM | POA: Diagnosis not present

## 2015-03-15 LAB — POCT HEMOGLOBIN-HEMACUE: HEMOGLOBIN: 10.4 g/dL — AB (ref 13.0–17.0)

## 2015-03-15 MED ORDER — EPOETIN ALFA 10000 UNIT/ML IJ SOLN
INTRAMUSCULAR | Status: AC
  Administered 2015-03-15: 5000 [IU] via SUBCUTANEOUS
  Filled 2015-03-15: qty 1

## 2015-03-15 MED ORDER — EPOETIN ALFA 10000 UNIT/ML IJ SOLN: 5000.0000 [IU] | INTRAMUSCULAR | Status: DC

## 2015-03-15 MED ORDER — EPOETIN ALFA 10000 UNIT/ML IJ SOLN
INTRAMUSCULAR | Status: DC
Start: 2015-03-15 — End: 2015-03-15
  Filled 2015-03-15: qty 1

## 2015-03-22 ENCOUNTER — Encounter (HOSPITAL_COMMUNITY): Payer: Medicaid Other

## 2015-04-03 ENCOUNTER — Encounter (HOSPITAL_COMMUNITY)
Admission: RE | Admit: 2015-04-03 | Discharge: 2015-04-03 | Disposition: A | Discharge status: Home / Self Care | Payer: Medicaid Other | Source: Ambulatory Visit | Attending: Nephrology | Admitting: Nephrology

## 2015-04-03 DIAGNOSIS — Z5181 Encounter for therapeutic drug level monitoring: Secondary | ICD-10-CM | POA: Diagnosis not present

## 2015-04-03 DIAGNOSIS — N184 Chronic kidney disease, stage 4 (severe): Secondary | ICD-10-CM | POA: Diagnosis present

## 2015-04-03 DIAGNOSIS — D631 Anemia in chronic kidney disease: Secondary | ICD-10-CM | POA: Insufficient documentation

## 2015-04-03 DIAGNOSIS — D509 Iron deficiency anemia, unspecified: Secondary | ICD-10-CM | POA: Diagnosis not present

## 2015-04-03 DIAGNOSIS — Z79899 Other long term (current) drug therapy: Secondary | ICD-10-CM | POA: Insufficient documentation

## 2015-04-03 LAB — POCT HEMOGLOBIN-HEMACUE: HEMOGLOBIN: 11.8 g/dL — AB (ref 13.0–17.0)

## 2015-04-03 MED ORDER — EPOETIN ALFA 10000 UNIT/ML IJ SOLN
5000.0000 [IU] | INTRAMUSCULAR | Status: DC
  Administered 2015-04-03: 5000 [IU] via SUBCUTANEOUS

## 2015-04-03 MED ORDER — EPOETIN ALFA 10000 UNIT/ML IJ SOLN
INTRAMUSCULAR | Status: AC
  Filled 2015-04-03: qty 1

## 2015-04-10 ENCOUNTER — Encounter (HOSPITAL_COMMUNITY): Payer: Medicaid Other

## 2015-04-14 ENCOUNTER — Encounter (HOSPITAL_COMMUNITY)
Admission: RE | Admit: 2015-04-14 | Discharge: 2015-04-14 | Disposition: A | Discharge status: Home / Self Care | Payer: Medicaid Other | Source: Ambulatory Visit | Attending: Nephrology | Admitting: Nephrology

## 2015-04-14 DIAGNOSIS — N184 Chronic kidney disease, stage 4 (severe): Secondary | ICD-10-CM | POA: Diagnosis not present

## 2015-04-14 LAB — IRON AND TIBC
IRON: 132 ug/dL (ref 45–182)
Saturation Ratios: 42 % — ABNORMAL HIGH (ref 17.9–39.5)
TIBC: 312 ug/dL (ref 250–450)
UIBC: 180 ug/dL

## 2015-04-14 LAB — FERRITIN: FERRITIN: 364 ng/mL — AB (ref 24–336)

## 2015-04-14 LAB — POCT HEMOGLOBIN-HEMACUE: Hemoglobin: 11.6 g/dL — ABNORMAL LOW (ref 13.0–17.0)

## 2015-04-14 MED ORDER — EPOETIN ALFA 10000 UNIT/ML IJ SOLN
INTRAMUSCULAR | Status: AC
  Filled 2015-04-14: qty 1

## 2015-04-14 MED ORDER — EPOETIN ALFA 10000 UNIT/ML IJ SOLN
5000.0000 [IU] | INTRAMUSCULAR | Status: DC
  Administered 2015-04-14: 5000 [IU] via SUBCUTANEOUS

## 2015-04-19 ENCOUNTER — Encounter (HOSPITAL_COMMUNITY)
Admission: RE | Admit: 2015-04-19 | Discharge: 2015-04-19 | Disposition: A | Discharge status: Home / Self Care | Payer: Medicaid Other | Source: Ambulatory Visit | Attending: Nephrology | Admitting: Nephrology

## 2015-04-19 DIAGNOSIS — N184 Chronic kidney disease, stage 4 (severe): Secondary | ICD-10-CM | POA: Diagnosis not present

## 2015-04-19 LAB — POCT HEMOGLOBIN-HEMACUE: HEMOGLOBIN: 11.5 g/dL — AB (ref 13.0–17.0)

## 2015-04-19 MED ORDER — EPOETIN ALFA 10000 UNIT/ML IJ SOLN
5000.0000 [IU] | INTRAMUSCULAR | Status: DC
  Administered 2015-04-19: 5000 [IU] via SUBCUTANEOUS

## 2015-04-19 MED ORDER — EPOETIN ALFA 10000 UNIT/ML IJ SOLN
INTRAMUSCULAR | Status: AC
  Filled 2015-04-19: qty 1

## 2015-04-27 ENCOUNTER — Inpatient Hospital Stay (HOSPITAL_COMMUNITY): Admission: RE | Admit: 2015-04-27 | Payer: Medicaid Other | Source: Ambulatory Visit

## 2015-05-16 ENCOUNTER — Other Ambulatory Visit (HOSPITAL_COMMUNITY): Payer: Self-pay | Admitting: *Deleted

## 2015-05-17 ENCOUNTER — Encounter (HOSPITAL_COMMUNITY)
Admission: RE | Admit: 2015-05-17 | Discharge: 2015-05-17 | Disposition: A | Discharge status: Home / Self Care | Payer: Medicaid Other | Source: Ambulatory Visit | Attending: Nephrology | Admitting: Nephrology

## 2015-05-17 DIAGNOSIS — N184 Chronic kidney disease, stage 4 (severe): Secondary | ICD-10-CM | POA: Diagnosis present

## 2015-05-17 DIAGNOSIS — D631 Anemia in chronic kidney disease: Secondary | ICD-10-CM | POA: Diagnosis not present

## 2015-05-17 DIAGNOSIS — D509 Iron deficiency anemia, unspecified: Secondary | ICD-10-CM | POA: Insufficient documentation

## 2015-05-17 DIAGNOSIS — Z5181 Encounter for therapeutic drug level monitoring: Secondary | ICD-10-CM | POA: Insufficient documentation

## 2015-05-17 DIAGNOSIS — Z79899 Other long term (current) drug therapy: Secondary | ICD-10-CM | POA: Diagnosis not present

## 2015-05-17 LAB — IRON AND TIBC
Iron: 77 ug/dL (ref 45–182)
Saturation Ratios: 24 % (ref 17.9–39.5)
TIBC: 326 ug/dL (ref 250–450)
UIBC: 249 ug/dL

## 2015-05-17 LAB — FERRITIN: Ferritin: 242 ng/mL (ref 24–336)

## 2015-05-17 LAB — POCT HEMOGLOBIN-HEMACUE: HEMOGLOBIN: 11.6 g/dL — AB (ref 13.0–17.0)

## 2015-05-17 MED ORDER — EPOETIN ALFA 10000 UNIT/ML IJ SOLN
INTRAMUSCULAR | Status: AC
  Filled 2015-05-17: qty 1

## 2015-05-17 MED ORDER — EPOETIN ALFA 10000 UNIT/ML IJ SOLN
5000.0000 [IU] | INTRAMUSCULAR | Status: DC
  Administered 2015-05-17: 5000 [IU] via SUBCUTANEOUS

## 2015-05-22 ENCOUNTER — Emergency Department (HOSPITAL_COMMUNITY): Payer: Medicaid Other

## 2015-05-22 ENCOUNTER — Encounter (HOSPITAL_COMMUNITY): Payer: Self-pay

## 2015-05-22 ENCOUNTER — Inpatient Hospital Stay (HOSPITAL_COMMUNITY)
Admission: EM | Admit: 2015-05-22 | Discharge: 2015-05-25 | DRG: 193 | Disposition: A | Discharge status: Home / Self Care | Payer: Medicaid Other | Attending: Internal Medicine | Admitting: Internal Medicine

## 2015-05-22 DIAGNOSIS — N183 Chronic kidney disease, stage 3 unspecified: Secondary | ICD-10-CM

## 2015-05-22 DIAGNOSIS — F1910 Other psychoactive substance abuse, uncomplicated: Secondary | ICD-10-CM | POA: Diagnosis present

## 2015-05-22 DIAGNOSIS — G8929 Other chronic pain: Secondary | ICD-10-CM | POA: Diagnosis present

## 2015-05-22 DIAGNOSIS — D649 Anemia, unspecified: Secondary | ICD-10-CM | POA: Diagnosis present

## 2015-05-22 DIAGNOSIS — D696 Thrombocytopenia, unspecified: Secondary | ICD-10-CM | POA: Diagnosis present

## 2015-05-22 DIAGNOSIS — M549 Dorsalgia, unspecified: Secondary | ICD-10-CM | POA: Diagnosis present

## 2015-05-22 DIAGNOSIS — F1410 Cocaine abuse, uncomplicated: Secondary | ICD-10-CM | POA: Diagnosis present

## 2015-05-22 DIAGNOSIS — Z841 Family history of disorders of kidney and ureter: Secondary | ICD-10-CM

## 2015-05-22 DIAGNOSIS — R0602 Shortness of breath: Secondary | ICD-10-CM

## 2015-05-22 DIAGNOSIS — N1831 Chronic kidney disease, stage 3a: Secondary | ICD-10-CM

## 2015-05-22 DIAGNOSIS — Z880 Allergy status to penicillin: Secondary | ICD-10-CM

## 2015-05-22 DIAGNOSIS — E875 Hyperkalemia: Secondary | ICD-10-CM | POA: Diagnosis present

## 2015-05-22 DIAGNOSIS — J440 Chronic obstructive pulmonary disease with acute lower respiratory infection: Secondary | ICD-10-CM | POA: Diagnosis present

## 2015-05-22 DIAGNOSIS — R188 Other ascites: Secondary | ICD-10-CM

## 2015-05-22 DIAGNOSIS — J9601 Acute respiratory failure with hypoxia: Secondary | ICD-10-CM | POA: Diagnosis present

## 2015-05-22 DIAGNOSIS — N179 Acute kidney failure, unspecified: Secondary | ICD-10-CM | POA: Diagnosis present

## 2015-05-22 DIAGNOSIS — J189 Pneumonia, unspecified organism: Principal | ICD-10-CM | POA: Diagnosis present

## 2015-05-22 DIAGNOSIS — F17200 Nicotine dependence, unspecified, uncomplicated: Secondary | ICD-10-CM | POA: Diagnosis present

## 2015-05-22 DIAGNOSIS — Z8673 Personal history of transient ischemic attack (TIA), and cerebral infarction without residual deficits: Secondary | ICD-10-CM

## 2015-05-22 DIAGNOSIS — Z833 Family history of diabetes mellitus: Secondary | ICD-10-CM

## 2015-05-22 DIAGNOSIS — Z8249 Family history of ischemic heart disease and other diseases of the circulatory system: Secondary | ICD-10-CM

## 2015-05-22 DIAGNOSIS — I509 Heart failure, unspecified: Secondary | ICD-10-CM | POA: Diagnosis present

## 2015-05-22 DIAGNOSIS — Z79899 Other long term (current) drug therapy: Secondary | ICD-10-CM

## 2015-05-22 DIAGNOSIS — K7031 Alcoholic cirrhosis of liver with ascites: Secondary | ICD-10-CM | POA: Diagnosis present

## 2015-05-22 DIAGNOSIS — I130 Hypertensive heart and chronic kidney disease with heart failure and stage 1 through stage 4 chronic kidney disease, or unspecified chronic kidney disease: Secondary | ICD-10-CM | POA: Diagnosis present

## 2015-05-22 LAB — COMPREHENSIVE METABOLIC PANEL
ALT: 30 U/L (ref 17–63)
ANION GAP: 9 (ref 5–15)
AST: 58 U/L — AB (ref 15–41)
Albumin: 2.7 g/dL — ABNORMAL LOW (ref 3.5–5.0)
Alkaline Phosphatase: 99 U/L (ref 38–126)
BILIRUBIN TOTAL: 0.9 mg/dL (ref 0.3–1.2)
BUN: 6 mg/dL (ref 6–20)
CHLORIDE: 103 mmol/L (ref 101–111)
CO2: 23 mmol/L (ref 22–32)
Calcium: 9 mg/dL (ref 8.9–10.3)
Creatinine, Ser: 1.36 mg/dL — ABNORMAL HIGH (ref 0.61–1.24)
GFR calc Af Amer: >60 mL/min (ref >60)
GFR, EST NON AFRICAN AMERICAN: 56 mL/min — AB (ref >60)
Glucose, Bld: 110 mg/dL — ABNORMAL HIGH (ref 65–99)
POTASSIUM: 3.5 mmol/L (ref 3.5–5.1)
Sodium: 135 mmol/L (ref 135–145)
TOTAL PROTEIN: 7.5 g/dL (ref 6.5–8.1)

## 2015-05-22 LAB — RAPID URINE DRUG SCREEN, HOSP PERFORMED
AMPHETAMINES: NOT DETECTED
BENZODIAZEPINES: NOT DETECTED
Barbiturates: NOT DETECTED
Cocaine: NOT DETECTED
OPIATES: NOT DETECTED
TETRAHYDROCANNABINOL: NOT DETECTED

## 2015-05-22 LAB — CBC WITH DIFFERENTIAL/PLATELET
BASOS ABS: 0 10*3/uL (ref 0.0–0.1)
Basophils Relative: 0 %
EOS PCT: 1 %
Eosinophils Absolute: 0.1 10*3/uL (ref 0.0–0.7)
HCT: 34.7 % — ABNORMAL LOW (ref 39.0–52.0)
Hemoglobin: 11.7 g/dL — ABNORMAL LOW (ref 13.0–17.0)
LYMPHS ABS: 1.4 10*3/uL (ref 0.7–4.0)
LYMPHS PCT: 27 %
MCH: 27.7 pg (ref 26.0–34.0)
MCHC: 33.7 g/dL (ref 30.0–36.0)
MCV: 82.2 fL (ref 78.0–100.0)
Monocytes Absolute: 0.9 10*3/uL (ref 0.1–1.0)
Monocytes Relative: 17 %
NEUTROS ABS: 2.8 10*3/uL (ref 1.7–7.7)
NEUTROS PCT: 55 %
PLATELETS: 145 10*3/uL — AB (ref 150–400)
RBC: 4.22 MIL/uL (ref 4.22–5.81)
RDW: 14.6 % (ref 11.5–15.5)
WBC: 5.1 10*3/uL (ref 4.0–10.5)

## 2015-05-22 LAB — URINALYSIS, ROUTINE W REFLEX MICROSCOPIC
Bilirubin Urine: NEGATIVE
Glucose, UA: NEGATIVE mg/dL
HGB URINE DIPSTICK: NEGATIVE
Ketones, ur: NEGATIVE mg/dL
Leukocytes, UA: NEGATIVE
Nitrite: NEGATIVE
Protein, ur: NEGATIVE mg/dL
SPECIFIC GRAVITY, URINE: 1.012 (ref 1.005–1.030)
pH: 5.5 (ref 5.0–8.0)

## 2015-05-22 LAB — ETHANOL: Alcohol, Ethyl (B): <5 mg/dL (ref <5)

## 2015-05-22 LAB — PROTIME-INR
INR: 1.25 (ref 0.00–1.49)
Prothrombin Time: 15.9 seconds — ABNORMAL HIGH (ref 11.6–15.2)

## 2015-05-22 LAB — TROPONIN I: Troponin I: <0.03 ng/mL (ref <0.031)

## 2015-05-22 MED ORDER — AEROCHAMBER PLUS W/MASK MISC
1.0000 | Freq: Once | Status: AC
  Administered 2015-05-22: 1
  Filled 2015-05-22: qty 1

## 2015-05-22 MED ORDER — PREDNISONE 20 MG PO TABS
60.0000 mg | ORAL_TABLET | Freq: Once | ORAL | Status: AC
  Administered 2015-05-22: 60 mg via ORAL
  Filled 2015-05-22: qty 3

## 2015-05-22 MED ORDER — ALBUTEROL SULFATE (2.5 MG/3ML) 0.083% IN NEBU
5.0000 mg | INHALATION_SOLUTION | Freq: Once | RESPIRATORY_TRACT | Status: AC
  Administered 2015-05-22: 5 mg via RESPIRATORY_TRACT
  Filled 2015-05-22: qty 6

## 2015-05-22 MED ORDER — KETOROLAC TROMETHAMINE 30 MG/ML IJ SOLN
30.0000 mg | Freq: Once | INTRAMUSCULAR | Status: AC
  Administered 2015-05-22: 30 mg via INTRAVENOUS
  Filled 2015-05-22: qty 1

## 2015-05-22 MED ORDER — ALBUTEROL SULFATE HFA 108 (90 BASE) MCG/ACT IN AERS
2.0000 | INHALATION_SPRAY | RESPIRATORY_TRACT | Status: DC | PRN
  Administered 2015-05-22: 2 via RESPIRATORY_TRACT
  Filled 2015-05-22: qty 6.7

## 2015-05-22 NOTE — ED Notes (Signed)
Pt able to eat & drink w/o difficulty.

## 2015-05-22 NOTE — ED Notes (Signed)
Pt arrives via EMS from apartment. Pt walked from his apartment to his mothers apartment and became SOB, anxious. EMS also noted distended belly. Pt was 90% on RA. Placed on 3L [place] and came up to 92%. NSR 80.

## 2015-05-22 NOTE — ED Provider Notes (Signed)
CSN: [id number]     Arrival date & time 05/22/15  1642 History   First MD Initiated Contact with Patient 05/22/15 1714     Chief Complaint  Patient presents with  . Shortness of Breath     (Consider location/radiation/quality/duration/timing/severity/associated sxs/prior Treatment) HPI   John [name] is a 60 y.o. male who presents for evaluation of shortness of breath which started 2 days ago. He also complains of swelling in his abdomen for 2 days. He became more short of breath on exertion, today after walking to his mother's apartment, so an ambulance was summoned. His pulse ox was low 90% on room air, so he was placed on 3 L nasal cannula oxygen with improvement. He is taking his usual medications without relief. He last saw his PCP about a week ago. He denies recent fever, chills, nausea, vomiting, diarrhea or constipation. He was able to eat today. He complains of a cough productive of clear sputum. There are no other known modifying factors.   Past Medical History  Diagnosis Date  . Hypertension   . Chronic back pain   . Stroke ([place]) 1998  . Polysubstance abuse   . Optic neuropathy, left   . CHF (congestive heart failure) ([place])   . Ascites   . ETOH abuse   . Cirrhosis [facility])    Past Surgical History  Procedure Laterality Date  . None    . Esophagogastroduodenoscopy N/A 08/14/2014    Procedure: ESOPHAGOGASTRODUODENOSCOPY (EGD);  Surgeon: Ladene Artist, MD;  Location: [facility];  Service: Endoscopy;  Laterality: N/A;  . Radiology with anesthesia N/A 08/15/2014    Procedure: RADIOLOGY WITH ANESTHESIA;  Surgeon: Greggory Keen, MD;  Location: [place];  Service: Radiology;  Laterality: N/A;   Family History  Problem Relation Age of Onset  . Hypertension Mother     Living  . Kidney disease Mother   . Diabetes Mother   . Heart disease Mother   . Hypertension Brother   . Diabetes Brother   . Kidney disease Brother   . Hypertension Father     Deceased, 8  . Ulcers  Father   . Hypertension Sister    Social History  Substance Use Topics  . Smoking status: Former Smoker -- 0.30 packs/day for 20 years    Types: Cigarettes  . Smokeless tobacco: Never Used     Comment: 2015  . Alcohol Use: No     Comment: occ    Review of Systems  All other systems reviewed and are negative.     Allergies  Penicillins  Home Medications   Prior to Admission medications   Medication Sig Start Date End Date Taking? Authorizing Provider  amLODipine (NORVASC) 10 MG tablet Take 10 mg by mouth daily.   Yes Historical Provider, MD  hydrALAZINE (APRESOLINE) 50 MG tablet Take 50 mg by mouth 3 (three) times daily.   Yes Historical Provider, MD  spironolactone (ALDACTONE) 25 MG tablet Take 25 mg by mouth 2 (two) times daily.   Yes Historical Provider, MD  Capsaicin-Menthol-Methyl Sal (CAPSAICIN-METHYL SAL-MENTHOL) 0.025-1-12 % CREA Apply 2 g topically 2 (two) times daily as needed (ankle pain). Patient not taking: Reported on 05/22/2015 12/27/14   Leo Grosser, MD  carvedilol (COREG) 6.25 MG tablet Take 1 tablet (6.25 mg total) by mouth 2 (two) times daily with a meal. Patient not taking: Reported on 05/22/2015 11/08/14   Ejiroghene Arlyce Dice, MD  diclofenac sodium (VOLTAREN) 1 % GEL Apply 2 g topically 4 (four) times daily.  Patient not taking: Reported on 05/22/2015 12/27/14   Leo Grosser, MD  ferrous sulfate 325 (65 FE) MG tablet Take 1 tablet (325 mg total) by mouth daily. Patient not taking: Reported on 05/22/2015 11/11/14   Arnoldo Morale, MD  folic acid (FOLVITE) 1 MG tablet Take 1 tablet (1 mg total) by mouth daily. Patient not taking: Reported on 05/22/2015 09/02/14   Allie Bossier, MD  furosemide (LASIX) 40 MG tablet Take 3 tablets (120 mg total) by mouth daily. Patient not taking: Reported on 05/22/2015 11/08/14   Ejiroghene Arlyce Dice, MD  lactulose (CHRONULAC) 10 GM/15ML solution Take 45 mLs (30 g total) by mouth 2 (two) times daily. Patient not taking: Reported on  05/22/2015 11/18/14   Arnoldo Morale, MD  pantoprazole (PROTONIX) 40 MG tablet Take 1 tablet (40 mg total) by mouth daily. Patient not taking: Reported on 05/22/2015 11/11/14   Arnoldo Morale, MD  rifaximin (XIFAXAN) 200 MG tablet Take 2 tablets (400 mg total) by mouth 3 (three) times daily. Patient not taking: Reported on 05/22/2015 09/02/14   Allie Bossier, MD  spironolactone (ALDACTONE) 100 MG tablet Take 4 tablets (400 mg total) by mouth daily. Patient not taking: Reported on 05/22/2015 11/08/14   Ejiroghene Arlyce Dice, MD  thiamine 50 MG tablet Take 10 tablets (500 mg total) by mouth daily. Patient not taking: Reported on 05/22/2015 11/08/14   Ejiroghene E Emokpae, MD   BP 127/81 mmHg  Pulse 87  Temp(Src) 97.7 F (36.5 C) (Oral)  Resp 24  SpO2 94% Physical Exam  Constitutional: He is oriented to person, place, and time. He appears well-developed and well-nourished.  HENT:  Head: Normocephalic and atraumatic.  Right Ear: External ear normal.  Left Ear: External ear normal.  Eyes: Conjunctivae and EOM are normal. Pupils are equal, round, and reactive to light.  Neck: Normal range of motion and phonation normal. Neck supple.  Cardiovascular: Normal rate, regular rhythm and normal heart sounds.   Pulmonary/Chest: Effort normal and breath sounds normal. No respiratory distress. He exhibits no bony tenderness.  Generalized rhonchi, decreased air movement and scattered wheezing.  Abdominal: Soft. He exhibits distension. There is no tenderness. There is no guarding.  Musculoskeletal: Normal range of motion. He exhibits no edema.  Neurological: He is alert and oriented to person, place, and time. No cranial nerve deficit or sensory deficit. He exhibits normal muscle tone. Coordination normal.  Skin: Skin is warm, dry and intact.  Psychiatric: He has a normal mood and affect. His behavior is normal. Judgment and thought content normal.  Nursing note and vitals reviewed.   ED Course  Procedures  (including critical care time) Medications  ketorolac (TORADOL) 30 MG/ML injection 30 mg (not administered)  albuterol (PROVENTIL HFA;VENTOLIN HFA) 108 (90 Base) MCG/ACT inhaler 2 puff (not administered)  aerochamber Z-Stat Plus/medium 1 each (not administered)  predniSONE (DELTASONE) tablet 60 mg (not administered)  albuterol (PROVENTIL) (2.5 MG/3ML) 0.083% nebulizer solution 5 mg (5 mg Nebulization Given 05/22/15 1652)  albuterol (PROVENTIL) (2.5 MG/3ML) 0.083% nebulizer solution 5 mg (5 mg Nebulization Given 05/22/15 2132)    Patient Vitals for the past 24 hrs:  BP Temp Temp src Pulse Resp SpO2  05/22/15 2130 127/81 mmHg - - 87 24 94 %  05/22/15 2115 131/81 mmHg - - 84 18 95 %  05/22/15 2045 134/80 mmHg - - 80 18 94 %  05/22/15 1930 136/80 mmHg - - 87 21 (!) 88 %  05/22/15 1700 141/90 mmHg - - 76 21 (!)  89 %  05/22/15 1649 131/93 mmHg 97.7 F (36.5 C) Oral 79 20 91 %    10:44 PM Reevaluation with update and discussion. After initial assessment and treatment, an updated evaluation reveals he c/o continued SOB and wheezing. Additional meds ordered. Vianka Ertel L   23:20- patient is still duspneic, oxygen was removed from nose, and with in 3 minutes, his oxygen saturation dropped to 88%. He became somewhat more dyspneic. He is not complaining of right anterior pleuritic chest wall discomfort. CT of chest, ordered to evaluate for pulmonary embolus.  CRITICAL CARE Performed by: Richarda Blade Total critical care time: 35  minutes Critical care time was exclusive of separately billable procedures and treating other patients. Critical care was necessary to treat or prevent imminent or life-threatening deterioration. Critical care was time spent personally by me on the following activities: development of treatment plan with patient and/or surrogate as well as nursing, discussions with consultants, evaluation of patient's response to treatment, examination of patient, obtaining history from  patient or surrogate, ordering and performing treatments and interventions, ordering and review of laboratory studies, ordering and review of radiographic studies, pulse oximetry and re-evaluation of patient's condition.  Labs Review Labs Reviewed  COMPREHENSIVE METABOLIC PANEL - Abnormal; Notable for the following:    Glucose, Bld 110 (*)    Creatinine, Ser 1.36 (*)    Albumin 2.7 (*)    AST 58 (*)    GFR calc non Af Amer 56 (*)    All other components within normal limits  CBC WITH DIFFERENTIAL/PLATELET - Abnormal; Notable for the following:    Hemoglobin 11.7 (*)    HCT 34.7 (*)    Platelets 145 (*)    All other components within normal limits  PROTIME-INR - Abnormal; Notable for the following:    Prothrombin Time 15.9 (*)    All other components within normal limits  ETHANOL  URINALYSIS, ROUTINE W REFLEX MICROSCOPIC (NOT AT [facility])  URINE RAPID DRUG SCREEN, HOSP PERFORMED  TROPONIN I    Imaging Review Dg Chest 2 View  05/22/2015  CLINICAL DATA:  Pt states he has been sick x 3-4 days, SOB, weakness, cough/cong, chest pain, HTN, ex-smoker. Hx stroke and CHF EXAM: CHEST  2 VIEW COMPARISON:  10/31/2014 FINDINGS: Heart is mildly enlarged. There are no focal consolidations or pleural effusions. Mild perihilar peribronchial thickening is noted. No pulmonary edema. Vascular coils are noted in the left upper quadrant of the abdomen. IMPRESSION: 1. Cardiomegaly. 2. Bronchitic changes. Electronically Signed   By: Nolon Nations M.D.   On: 05/22/2015 17:22   I have personally reviewed and evaluated these images and lab results as part of my medical decision-making.   EKG Interpretation   Date/Time:  Monday May 22 2015 16:46:43 EST Ventricular Rate:  79 PR Interval:  107 QRS Duration: 97 QT Interval:  461 QTC Calculation: 528 R Axis:   49 Text Interpretation:  Ectopic atrial rhythm Short PR interval Repol abnrm  suggests ischemia, lateral leads Prolonged QT interval Baseline  wander in  lead(s) II III aVR aVF V4 Since last tracing ST abnormality is new and QT  has lengthened Confirmed by Eulis Foster  MD, Leianne Callins [id number]) on 05/22/2015  5:17:55 PM      MDM   Final diagnoses:  SOB (shortness of breath)  CAP (community acquired pneumonia)  Ascites of liver    Nonspecific dyspnea. Patient has an element of COPD exacerbation and possibly acute pulmonary embolism. Ongoing dyspnea required support with oxygen by nasal cannula,  and repeated nebulizer treatment.  Nursing Notes Reviewed/ Care Coordinated, and agree without changes. Applicable Imaging Reviewed.  Interpretation of Laboratory Data incorporated into ED treatment  Plan-as per oncoming provider team, after completion of imaging.    Daleen Bo, MD 05/25/15 [phone]

## 2015-05-22 NOTE — ED Notes (Signed)
EKG handed to Dr. Wentz 

## 2015-05-23 ENCOUNTER — Inpatient Hospital Stay (HOSPITAL_COMMUNITY): Payer: Medicaid Other

## 2015-05-23 ENCOUNTER — Encounter (HOSPITAL_COMMUNITY): Payer: Self-pay | Admitting: Radiology

## 2015-05-23 ENCOUNTER — Emergency Department (HOSPITAL_COMMUNITY): Payer: Medicaid Other

## 2015-05-23 DIAGNOSIS — J9601 Acute respiratory failure with hypoxia: Secondary | ICD-10-CM | POA: Diagnosis present

## 2015-05-23 DIAGNOSIS — N179 Acute kidney failure, unspecified: Secondary | ICD-10-CM | POA: Diagnosis present

## 2015-05-23 DIAGNOSIS — J189 Pneumonia, unspecified organism: Secondary | ICD-10-CM

## 2015-05-23 DIAGNOSIS — D696 Thrombocytopenia, unspecified: Secondary | ICD-10-CM | POA: Diagnosis present

## 2015-05-23 DIAGNOSIS — F1910 Other psychoactive substance abuse, uncomplicated: Secondary | ICD-10-CM

## 2015-05-23 DIAGNOSIS — Z8673 Personal history of transient ischemic attack (TIA), and cerebral infarction without residual deficits: Secondary | ICD-10-CM | POA: Diagnosis not present

## 2015-05-23 DIAGNOSIS — Z880 Allergy status to penicillin: Secondary | ICD-10-CM | POA: Diagnosis not present

## 2015-05-23 DIAGNOSIS — K7031 Alcoholic cirrhosis of liver with ascites: Secondary | ICD-10-CM

## 2015-05-23 DIAGNOSIS — Z841 Family history of disorders of kidney and ureter: Secondary | ICD-10-CM | POA: Diagnosis not present

## 2015-05-23 DIAGNOSIS — I130 Hypertensive heart and chronic kidney disease with heart failure and stage 1 through stage 4 chronic kidney disease, or unspecified chronic kidney disease: Secondary | ICD-10-CM | POA: Diagnosis present

## 2015-05-23 DIAGNOSIS — J440 Chronic obstructive pulmonary disease with acute lower respiratory infection: Secondary | ICD-10-CM | POA: Diagnosis present

## 2015-05-23 DIAGNOSIS — N183 Chronic kidney disease, stage 3 (moderate): Secondary | ICD-10-CM | POA: Diagnosis present

## 2015-05-23 DIAGNOSIS — E875 Hyperkalemia: Secondary | ICD-10-CM | POA: Diagnosis present

## 2015-05-23 DIAGNOSIS — Z8249 Family history of ischemic heart disease and other diseases of the circulatory system: Secondary | ICD-10-CM | POA: Diagnosis not present

## 2015-05-23 DIAGNOSIS — G8929 Other chronic pain: Secondary | ICD-10-CM | POA: Diagnosis present

## 2015-05-23 DIAGNOSIS — F1410 Cocaine abuse, uncomplicated: Secondary | ICD-10-CM | POA: Diagnosis present

## 2015-05-23 DIAGNOSIS — R0602 Shortness of breath: Secondary | ICD-10-CM | POA: Diagnosis present

## 2015-05-23 DIAGNOSIS — M549 Dorsalgia, unspecified: Secondary | ICD-10-CM | POA: Diagnosis present

## 2015-05-23 DIAGNOSIS — D649 Anemia, unspecified: Secondary | ICD-10-CM | POA: Diagnosis present

## 2015-05-23 DIAGNOSIS — I509 Heart failure, unspecified: Secondary | ICD-10-CM | POA: Diagnosis present

## 2015-05-23 DIAGNOSIS — Z833 Family history of diabetes mellitus: Secondary | ICD-10-CM | POA: Diagnosis not present

## 2015-05-23 DIAGNOSIS — Z79899 Other long term (current) drug therapy: Secondary | ICD-10-CM | POA: Diagnosis not present

## 2015-05-23 DIAGNOSIS — F17200 Nicotine dependence, unspecified, uncomplicated: Secondary | ICD-10-CM | POA: Diagnosis present

## 2015-05-23 HISTORY — DX: Pneumonia, unspecified organism: J18.9

## 2015-05-23 LAB — INFLUENZA PANEL BY PCR (TYPE A & B)
H1N1FLUPCR: NOT DETECTED
Influenza A By PCR: NEGATIVE
Influenza B By PCR: NEGATIVE

## 2015-05-23 LAB — CBC
HEMATOCRIT: 32.8 % — AB (ref 39.0–52.0)
Hemoglobin: 11.2 g/dL — ABNORMAL LOW (ref 13.0–17.0)
MCH: 28.2 pg (ref 26.0–34.0)
MCHC: 34.1 g/dL (ref 30.0–36.0)
MCV: 82.6 fL (ref 78.0–100.0)
PLATELETS: 146 10*3/uL — AB (ref 150–400)
RBC: 3.97 MIL/uL — ABNORMAL LOW (ref 4.22–5.81)
RDW: 14.6 % (ref 11.5–15.5)
WBC: 13.2 10*3/uL — AB (ref 4.0–10.5)

## 2015-05-23 LAB — BODY FLUID CELL COUNT WITH DIFFERENTIAL
LYMPHS FL: 10 %
Monocyte-Macrophage-Serous Fluid: 87 % (ref 50–90)
NEUTROPHIL FLUID: 3 % (ref 0–25)
WBC FLUID: 52 uL (ref 0–1000)

## 2015-05-23 LAB — RAPID URINE DRUG SCREEN, HOSP PERFORMED
Amphetamines: NOT DETECTED
BARBITURATES: NOT DETECTED
Benzodiazepines: NOT DETECTED
Cocaine: NOT DETECTED
Opiates: NOT DETECTED
Tetrahydrocannabinol: NOT DETECTED

## 2015-05-23 LAB — EXPECTORATED SPUTUM ASSESSMENT W REFEX TO RESP CULTURE

## 2015-05-23 LAB — BASIC METABOLIC PANEL
Anion gap: 19 — ABNORMAL HIGH (ref 5–15)
BUN: 11 mg/dL (ref 6–20)
CHLORIDE: 102 mmol/L (ref 101–111)
CO2: 16 mmol/L — ABNORMAL LOW (ref 22–32)
CREATININE: 1.75 mg/dL — AB (ref 0.61–1.24)
Calcium: 8.9 mg/dL (ref 8.9–10.3)
GFR calc Af Amer: 48 mL/min — ABNORMAL LOW (ref >60)
GFR, EST NON AFRICAN AMERICAN: 41 mL/min — AB (ref >60)
GLUCOSE: 159 mg/dL — AB (ref 65–99)
POTASSIUM: 3.3 mmol/L — AB (ref 3.5–5.1)
Sodium: 137 mmol/L (ref 135–145)

## 2015-05-23 LAB — HEPATIC FUNCTION PANEL
ALT: 28 U/L (ref 17–63)
AST: 61 U/L — AB (ref 15–41)
Albumin: 2.6 g/dL — ABNORMAL LOW (ref 3.5–5.0)
Alkaline Phosphatase: 92 U/L (ref 38–126)
BILIRUBIN DIRECT: 0.4 mg/dL (ref 0.1–0.5)
BILIRUBIN INDIRECT: 0.2 mg/dL — AB (ref 0.3–0.9)
BILIRUBIN TOTAL: 0.6 mg/dL (ref 0.3–1.2)
Total Protein: 7.3 g/dL (ref 6.5–8.1)

## 2015-05-23 LAB — GRAM STAIN

## 2015-05-23 LAB — EXPECTORATED SPUTUM ASSESSMENT W GRAM STAIN, RFLX TO RESP C

## 2015-05-23 LAB — PROTEIN, BODY FLUID: Total protein, fluid: <3 g/dL

## 2015-05-23 LAB — ALBUMIN, FLUID (OTHER)

## 2015-05-23 LAB — TROPONIN I
Troponin I: 0.03 ng/mL (ref <0.031)
Troponin I: <0.03 ng/mL (ref <0.031)
Troponin I: 0.04 ng/mL — ABNORMAL HIGH (ref <0.031)

## 2015-05-23 LAB — STREP PNEUMONIAE URINARY ANTIGEN: STREP PNEUMO URINARY ANTIGEN: NEGATIVE

## 2015-05-23 LAB — HIV ANTIBODY (ROUTINE TESTING W REFLEX): HIV Screen 4th Generation wRfx: NONREACTIVE

## 2015-05-23 LAB — LACTATE DEHYDROGENASE, PLEURAL OR PERITONEAL FLUID: LD, Fluid: 43 U/L — ABNORMAL HIGH (ref 3–23)

## 2015-05-23 MED ORDER — SODIUM CHLORIDE 0.9 % IJ SOLN
3.0000 mL | Freq: Two times a day (BID) | INTRAMUSCULAR | Status: DC
  Administered 2015-05-23 – 2015-05-25 (×3): 3 mL via INTRAVENOUS

## 2015-05-23 MED ORDER — INFLUENZA VAC SPLIT QUAD 0.5 ML IM SUSY: 0.5000 mL | PREFILLED_SYRINGE | INTRAMUSCULAR | Status: DC

## 2015-05-23 MED ORDER — ONDANSETRON HCL 4 MG PO TABS: 4.0000 mg | ORAL_TABLET | Freq: Four times a day (QID) | ORAL | Status: DC | PRN

## 2015-05-23 MED ORDER — ENOXAPARIN SODIUM 40 MG/0.4ML ~~LOC~~ SOLN
40.0000 mg | Freq: Every day | SUBCUTANEOUS | Status: DC
  Administered 2015-05-23 – 2015-05-24 (×2): 40 mg via SUBCUTANEOUS
  Filled 2015-05-23 (×4): qty 0.4

## 2015-05-23 MED ORDER — POTASSIUM CHLORIDE CRYS ER 20 MEQ PO TBCR
40.0000 meq | EXTENDED_RELEASE_TABLET | ORAL | Status: AC
  Administered 2015-05-23 (×2): 40 meq via ORAL
  Filled 2015-05-23 (×2): qty 2

## 2015-05-23 MED ORDER — IOHEXOL 350 MG/ML SOLN
100.0000 mL | Freq: Once | INTRAVENOUS | Status: AC | PRN
  Administered 2015-05-23: 100 mL via INTRAVENOUS

## 2015-05-23 MED ORDER — HYDRALAZINE HCL 50 MG PO TABS
50.0000 mg | ORAL_TABLET | Freq: Three times a day (TID) | ORAL | Status: DC
  Administered 2015-05-23 – 2015-05-25 (×7): 50 mg via ORAL
  Filled 2015-05-23 (×3): qty 1
  Filled 2015-05-23: qty 2
  Filled 2015-05-23 (×3): qty 1

## 2015-05-23 MED ORDER — ALBUTEROL (5 MG/ML) CONTINUOUS INHALATION SOLN
10.0000 mg/h | INHALATION_SOLUTION | Freq: Once | RESPIRATORY_TRACT | Status: AC
  Administered 2015-05-23: 10 mg/h via RESPIRATORY_TRACT
  Filled 2015-05-23: qty 20

## 2015-05-23 MED ORDER — ACETAMINOPHEN 650 MG RE SUPP: 650.0000 mg | Freq: Four times a day (QID) | RECTAL | Status: DC | PRN

## 2015-05-23 MED ORDER — ACETAMINOPHEN 325 MG PO TABS: 650.0000 mg | ORAL_TABLET | Freq: Four times a day (QID) | ORAL | Status: DC | PRN

## 2015-05-23 MED ORDER — SPIRONOLACTONE 25 MG PO TABS
25.0000 mg | ORAL_TABLET | Freq: Two times a day (BID) | ORAL | Status: DC
  Administered 2015-05-23: 25 mg via ORAL
  Filled 2015-05-23 (×3): qty 1

## 2015-05-23 MED ORDER — LEVOFLOXACIN IN D5W 750 MG/150ML IV SOLN
750.0000 mg | INTRAVENOUS | Status: DC
Start: 2015-05-23 — End: 2015-05-25
  Administered 2015-05-24 – 2015-05-25 (×2): 750 mg via INTRAVENOUS
  Filled 2015-05-23 (×3): qty 150

## 2015-05-23 MED ORDER — LIDOCAINE HCL (PF) 1 % IJ SOLN
INTRAMUSCULAR | Status: AC
  Administered 2015-05-23: 15:00:00
  Filled 2015-05-23: qty 10

## 2015-05-23 MED ORDER — AMLODIPINE BESYLATE 10 MG PO TABS
10.0000 mg | ORAL_TABLET | Freq: Every day | ORAL | Status: DC
  Administered 2015-05-23 – 2015-05-25 (×3): 10 mg via ORAL
  Filled 2015-05-23: qty 1
  Filled 2015-05-23: qty 2
  Filled 2015-05-23: qty 1

## 2015-05-23 MED ORDER — LEVOFLOXACIN IN D5W 750 MG/150ML IV SOLN
750.0000 mg | Freq: Once | INTRAVENOUS | Status: AC
  Administered 2015-05-23: 750 mg via INTRAVENOUS
  Filled 2015-05-23: qty 150

## 2015-05-23 MED ORDER — ONDANSETRON HCL 4 MG/2ML IJ SOLN: 4.0000 mg | Freq: Four times a day (QID) | INTRAMUSCULAR | Status: DC | PRN

## 2015-05-23 NOTE — ED Notes (Signed)
Patient transported to CT 

## 2015-05-23 NOTE — ED Notes (Signed)
Attempted report 

## 2015-05-23 NOTE — Progress Notes (Signed)
Notified by CCMD that pt had one 2nd degree type II beat with a 1.38 pause.  Strip saved.  Pt asymptomatic and currently in NSR.  RN will cont to monitor pt.  Claudette Stapler, RN

## 2015-05-23 NOTE — ED Notes (Signed)
MD at bedside. 

## 2015-05-23 NOTE — Progress Notes (Signed)
[facility]  60 y.o. male history of cirrhosis of the liver, polysubstance abuse including cocaine, hypertension presents to the ER because of worsening shortness of breath or chest pain. CT chest shows pneumonia.  Subjective: - has a cough. No dyspnea. No abdominal pain or nausea.  Principal Problem:   Acute respiratory failure with hypoxia/ CAP - cont Levaquin and O2 as needed  Active Problems:   Polysubstance abuse - advised to stop abusing    Ascites due to alcoholic cirrhosis  - paracentesis ordered- hold Lasix and Aldactone due to ARF    ARF (acute renal failure) (HCC) - hold lasix and Aldactone  HTN - cont Amlodipine and Hydralazine   Debbe Odea, MD

## 2015-05-23 NOTE — ED Notes (Signed)
Low sodium breakfast tray ordered per MD.

## 2015-05-23 NOTE — Progress Notes (Signed)
UR Completed. Cleve Paolillo, RN, BSN.  [phone] 

## 2015-05-23 NOTE — ED Provider Notes (Signed)
Patient signout from Dr. Eulis Foster at change of shift.  The patient with a past medical history polysubstance abuse, alcoholism, ascites, history of stroke, chronic pain presents to the emergency department complaining shortness of breath that started 2 days ago. Is also complaining of swelling in his abdomen for 2 days. He describes shortness of breath with exertion after walking earlier today. On arrival his pulse ox is low on 90% on room air which improved with 3 L nasal cannula oxygen.  1:00 am Currently the patient is waiting for CT Angie of the chest result to rule out PE as the patient is complaining of chest pain and shortness of breath, with increased respiratory effort.  1: 30 am CT angiogram of the chest shows that the patient has a focal pneumonia in the right upper lung. The patient denies being admitted to any facility within the last 90 days and therefore will be treated as community-acquired. He continues to have increased effort of breathing, pulse ox is 96% with 3 L nasal cannula.  I will start Levaquin IV, he has severe reaction to penicillin. I will also start an hour-long nebulizer treatment for his shortness of breath. And will admit unassigned.   Ct Angio Chest Pe W/cm &/or Wo Cm  05/23/2015  CLINICAL DATA:  Shortness of breath after walking. Abdominal distention. 90% on room air. EXAM: CT ANGIOGRAPHY CHEST WITH CONTRAST TECHNIQUE: Multidetector CT imaging of the chest was performed using the standard protocol during bolus administration of intravenous contrast. Multiplanar CT image reconstructions and MIPs were obtained to evaluate the vascular anatomy. CONTRAST:  170mL OMNIPAQUE IOHEXOL 350 MG/ML SOLN COMPARISON:  None. FINDINGS: There is moderately good opacification of the central and proximal segmental pulmonary arteries. No large central filling defects are demonstrated suggesting no significant pulmonary embolus. Peripheral emboli are not excluded due to technique. Mild  cardiac enlargement. Normal caliber thoracic aorta. No aortic dissection. Great vessel origins are patent. Esophagus is decompressed. Calcified lymph nodes in the right hilum. Consolidation with air bronchograms in the right lower lung with patchy airspace disease scattered throughout the right lung. Changes likely represent pneumonia. No pleural effusions. No pneumothorax. Left lung is grossly clear. Included portions of the upper abdominal organs demonstrate changes of hepatic cirrhosis. Diffuse upper abdominal fluid is likely ascites. Surgical clips at the EG junction. No destructive bone lesions. Review of the MIP images confirms the above findings. IMPRESSION: No evidence of significant central pulmonary embolus although limited contrast bolus limits evaluation of peripheral pulmonary arteries. Consolidation and patchy airspace disease in the right lung likely representing pneumonia. Changes of hepatic cirrhosis with upper abdominal ascites. Electronically Signed   By: Lucienne Capers M.D.   On: 05/23/2015 00:52    Inpatient, [place], [facility], Tele, Dr. Fuller Plan, PA-C 05/23/15 [place], MD 05/25/15 [phone]

## 2015-05-23 NOTE — H&P (Addendum)
[facility] History and Physical  Woodruff Degolier [id number] DOB: 1955/06/18 DOA: 05/22/2015  Referring physician: MS.Kirichenko. PCP: Elizabeth Palau, MD  Specialists: NONE.  Chief Complaint: Shortness of breath and chest pain.  HPI: John [name] is a 60 y.o. male history of cirrhosis of the liver, polysubstance abuse including cocaine, hypertension presents to the ER because of worsening shortness of breath or chest pain. Patient states over the last 3 days patient has been having increasing shortness of breath on exertion with productive cough and the chest pain on deep breathing and coughing. Patient also noticed increasing swelling of his abdomen. CT angiogram of the chest was showing pneumonic process. On exam patient also has distended abdomen. Patient admits to have taken cocaine 2 weeks ago. Patient will be admitted for further management of pneumonia and ascites.  Review of Systems: As presented in the history of presenting illness, rest negative.  Past Medical History  Diagnosis Date  . Hypertension   . Chronic back pain   . Stroke ([place]) 1998  . Polysubstance abuse   . Optic neuropathy, left   . CHF (congestive heart failure) ([place])   . Ascites   . ETOH abuse   . Cirrhosis [facility])    Past Surgical History  Procedure Laterality Date  . None    . Esophagogastroduodenoscopy N/A 08/14/2014    Procedure: ESOPHAGOGASTRODUODENOSCOPY (EGD);  Surgeon: Ladene Artist, MD;  Location: [facility];  Service: Endoscopy;  Laterality: N/A;  . Radiology with anesthesia N/A 08/15/2014    Procedure: RADIOLOGY WITH ANESTHESIA;  Surgeon: Greggory Keen, MD;  Location: [place];  Service: Radiology;  Laterality: N/A;   Social History:  reports that he has quit smoking. His smoking use included Cigarettes. He has a 6 pack-year smoking history. He has never used smokeless tobacco. He reports that he does not drink alcohol or use illicit drugs. Where does patient live - home. Can  patient participate in ADLs?  yes.  Allergies  Allergen Reactions  . Penicillins     Convulsions, Patient could not walk  Has patient had a PCN reaction causing immediate rash, facial/tongue/throat swelling, SOB or lightheadedness with hypotension: No Has patient had a PCN reaction causing severe rash involving mucus membranes or skin necrosis: No Has patient had a PCN reaction that required hospitalization No Has patient had a PCN reaction occurring within the last 10 years: No If all of the above answers are "NO", then may proceed with Cephalosporin use.    Family History:  Family History  Problem Relation Age of Onset  . Hypertension Mother     Living  . Kidney disease Mother   . Diabetes Mother   . Heart disease Mother   . Hypertension Brother   . Diabetes Brother   . Kidney disease Brother   . Hypertension Father     Deceased, 15  . Ulcers Father   . Hypertension Sister       Prior to Admission medications   Medication Sig Start Date End Date Taking? Authorizing Provider  amLODipine (NORVASC) 10 MG tablet Take 10 mg by mouth daily.   Yes Historical Provider, MD  hydrALAZINE (APRESOLINE) 50 MG tablet Take 50 mg by mouth 3 (three) times daily.   Yes Historical Provider, MD  spironolactone (ALDACTONE) 25 MG tablet Take 25 mg by mouth 2 (two) times daily.   Yes Historical Provider, MD  Capsaicin-Menthol-Methyl Sal (CAPSAICIN-METHYL SAL-MENTHOL) 0.025-1-12 % CREA Apply 2 g topically 2 (two) times daily as needed (ankle pain). Patient not taking:  Reported on 05/22/2015 12/27/14   Leo Grosser, MD  carvedilol (COREG) 6.25 MG tablet Take 1 tablet (6.25 mg total) by mouth 2 (two) times daily with a meal. Patient not taking: Reported on 05/22/2015 11/08/14   Ejiroghene Arlyce Dice, MD  diclofenac sodium (VOLTAREN) 1 % GEL Apply 2 g topically 4 (four) times daily. Patient not taking: Reported on 05/22/2015 12/27/14   Leo Grosser, MD  ferrous sulfate 325 (65 FE) MG tablet Take 1 tablet  (325 mg total) by mouth daily. Patient not taking: Reported on 05/22/2015 11/11/14   Arnoldo Morale, MD  folic acid (FOLVITE) 1 MG tablet Take 1 tablet (1 mg total) by mouth daily. Patient not taking: Reported on 05/22/2015 09/02/14   Allie Bossier, MD  furosemide (LASIX) 40 MG tablet Take 3 tablets (120 mg total) by mouth daily. Patient not taking: Reported on 05/22/2015 11/08/14   Ejiroghene Arlyce Dice, MD  lactulose (CHRONULAC) 10 GM/15ML solution Take 45 mLs (30 g total) by mouth 2 (two) times daily. Patient not taking: Reported on 05/22/2015 11/18/14   Arnoldo Morale, MD  pantoprazole (PROTONIX) 40 MG tablet Take 1 tablet (40 mg total) by mouth daily. Patient not taking: Reported on 05/22/2015 11/11/14   Arnoldo Morale, MD  rifaximin (XIFAXAN) 200 MG tablet Take 2 tablets (400 mg total) by mouth 3 (three) times daily. Patient not taking: Reported on 05/22/2015 09/02/14   Allie Bossier, MD  spironolactone (ALDACTONE) 100 MG tablet Take 4 tablets (400 mg total) by mouth daily. Patient not taking: Reported on 05/22/2015 11/08/14   Ejiroghene Arlyce Dice, MD  thiamine 50 MG tablet Take 10 tablets (500 mg total) by mouth daily. Patient not taking: Reported on 05/22/2015 11/08/14   Bethena Roys, MD    Physical Exam: Filed Vitals:   05/23/15 0145 05/23/15 0153 05/23/15 0200 05/23/15 0215  BP: 126/76  129/79 131/65  Pulse: 88 88 88 99  Temp:      TempSrc:      Resp: 24 26 32 31  SpO2: 96% 96% 92% 96%     General:  Moderately built and nourished.  Eyes: Anicteric no pallor.  ENT: No discharge from the ears eyes nose or mouth.  Neck: No mass felt. No JVD appreciated.  Cardiovascular: S1-S2 heard.  Respiratory: No rhonchi or crepitations.  Abdomen: Soft nontender distended bowel sounds present.  Skin: No rash.  Musculoskeletal: No edema.  Psychiatric: Appears normal.  Neurologic: Alert awake oriented to time place and person. Moves all extremities.  Labs on Admission:  Basic Metabolic  Panel:  Recent Labs Lab 05/22/15 1838  NA 135  K 3.5  CL 103  CO2 23  GLUCOSE 110*  BUN 6  CREATININE 1.36*  CALCIUM 9.0   Liver Function Tests:  Recent Labs Lab 05/22/15 1838  AST 58*  ALT 30  ALKPHOS 99  BILITOT 0.9  PROT 7.5  ALBUMIN 2.7*   No results for input(s): LIPASE, AMYLASE in the last 168 hours. No results for input(s): AMMONIA in the last 168 hours. CBC:  Recent Labs Lab 05/17/15 0924 05/22/15 1838  WBC  --  5.1  NEUTROABS  --  2.8  HGB 11.6* 11.7*  HCT  --  34.7*  MCV  --  82.2  PLT  --  145*   Cardiac Enzymes:  Recent Labs Lab 05/22/15 1838  TROPONINI <0.03    BNP (last 3 results)  Recent Labs  06/24/14 0802 10/31/14 1856  BNP 1168.5* 398.9*    ProBNP (  last 3 results) No results for input(s): PROBNP in the last 8760 hours.  CBG: No results for input(s): GLUCAP in the last 168 hours.  Radiological Exams on Admission: Dg Chest 2 View  05/22/2015  CLINICAL DATA:  Pt states he has been sick x 3-4 days, SOB, weakness, cough/cong, chest pain, HTN, ex-smoker. Hx stroke and CHF EXAM: CHEST  2 VIEW COMPARISON:  10/31/2014 FINDINGS: Heart is mildly enlarged. There are no focal consolidations or pleural effusions. Mild perihilar peribronchial thickening is noted. No pulmonary edema. Vascular coils are noted in the left upper quadrant of the abdomen. IMPRESSION: 1. Cardiomegaly. 2. Bronchitic changes. Electronically Signed   By: Nolon Nations M.D.   On: 05/22/2015 17:22   Ct Angio Chest Pe W/cm &/or Wo Cm  05/23/2015  CLINICAL DATA:  Shortness of breath after walking. Abdominal distention. 90% on room air. EXAM: CT ANGIOGRAPHY CHEST WITH CONTRAST TECHNIQUE: Multidetector CT imaging of the chest was performed using the standard protocol during bolus administration of intravenous contrast. Multiplanar CT image reconstructions and MIPs were obtained to evaluate the vascular anatomy. CONTRAST:  161mL OMNIPAQUE IOHEXOL 350 MG/ML SOLN COMPARISON:   None. FINDINGS: There is moderately good opacification of the central and proximal segmental pulmonary arteries. No large central filling defects are demonstrated suggesting no significant pulmonary embolus. Peripheral emboli are not excluded due to technique. Mild cardiac enlargement. Normal caliber thoracic aorta. No aortic dissection. Great vessel origins are patent. Esophagus is decompressed. Calcified lymph nodes in the right hilum. Consolidation with air bronchograms in the right lower lung with patchy airspace disease scattered throughout the right lung. Changes likely represent pneumonia. No pleural effusions. No pneumothorax. Left lung is grossly clear. Included portions of the upper abdominal organs demonstrate changes of hepatic cirrhosis. Diffuse upper abdominal fluid is likely ascites. Surgical clips at the EG junction. No destructive bone lesions. Review of the MIP images confirms the above findings. IMPRESSION: No evidence of significant central pulmonary embolus although limited contrast bolus limits evaluation of peripheral pulmonary arteries. Consolidation and patchy airspace disease in the right lung likely representing pneumonia. Changes of hepatic cirrhosis with upper abdominal ascites. Electronically Signed   By: Lucienne Capers M.D.   On: 05/23/2015 00:52    EKG: Independently reviewed. Normal sinus rhythm with prolonged QT interval at 528 ms.  Assessment/Plan Principal Problem:   Acute respiratory failure with hypoxia (HCC) Active Problems:   Polysubstance abuse   Ascites due to alcoholic cirrhosis (HCC)   CAP (community acquired pneumonia)   ARF (acute renal failure) ([place])   1. Acute respiratory failure with hypoxia - probably a combination of pneumonia and ascites. At this time patient has been placed on empiric antibiotics for community acquired pneumonia. Check urine strep antigen and Legionella influenza PCR. With regarding to ascites seen #2. 2. Liver cirrhosis with  ascites - patient is on spironolactone which may need to be held if patient's renal function worsens. I have ordered ultrasound-guided paracentesis for diagnostic and therapeutic paracentesis. 3. Chest pain appears pleuritic and atypical - cycle cardiac markers given the history of cocaine abuse. 4. Polysubstance abuse - patient admits to taking cocaine. Patient states he stopping alcohol and smoking cigarettes a year ago. Advised to quit smoking and cocaine. 5. Acute renal failure - if patient's creatinine worsens patient's spironolactone has to be held. 6. Chronic anemia and thrombocytopenia - follow CBC. 7. Hypertension - continue amlodipine and hydralazine.   DVT Prophylaxis - LOVENOX. Code Status: Full code.  Family Communication: Discussed with patient.  Disposition Plan: Admit to inpatient.    Raziah Funnell N. [facility] Pager [phone].  If 7PM-7AM, please contact night-coverage [email or web address] Password [email or web address] 05/23/2015, 2:50 AM

## 2015-05-24 ENCOUNTER — Encounter (HOSPITAL_COMMUNITY): Payer: Medicaid Other

## 2015-05-24 LAB — CBC
HEMATOCRIT: 30.1 % — AB (ref 39.0–52.0)
HEMATOCRIT: 32.5 % — AB (ref 39.0–52.0)
HEMOGLOBIN: 11.1 g/dL — AB (ref 13.0–17.0)
Hemoglobin: 10.4 g/dL — ABNORMAL LOW (ref 13.0–17.0)
MCH: 28.3 pg (ref 26.0–34.0)
MCH: 28.5 pg (ref 26.0–34.0)
MCHC: 34.6 g/dL (ref 30.0–36.0)
MCHC: 34.2 g/dL (ref 30.0–36.0)
MCV: 81.8 fL (ref 78.0–100.0)
MCV: 83.5 fL (ref 78.0–100.0)
Platelets: 116 10*3/uL — ABNORMAL LOW (ref 150–400)
Platelets: 127 10*3/uL — ABNORMAL LOW (ref 150–400)
RBC: 3.68 MIL/uL — ABNORMAL LOW (ref 4.22–5.81)
RBC: 3.89 MIL/uL — AB (ref 4.22–5.81)
RDW: 14.8 % (ref 11.5–15.5)
RDW: 15.1 % (ref 11.5–15.5)
WBC: 17.9 10*3/uL — ABNORMAL HIGH (ref 4.0–10.5)
WBC: 16.4 10*3/uL — AB (ref 4.0–10.5)

## 2015-05-24 LAB — LEGIONELLA ANTIGEN, URINE

## 2015-05-24 LAB — COMPREHENSIVE METABOLIC PANEL
ALBUMIN: 2.5 g/dL — AB (ref 3.5–5.0)
ALT: 25 U/L (ref 17–63)
AST: 43 U/L — AB (ref 15–41)
Alkaline Phosphatase: 92 U/L (ref 38–126)
Anion gap: 13 (ref 5–15)
BILIRUBIN TOTAL: 0.7 mg/dL (ref 0.3–1.2)
BUN: 16 mg/dL (ref 6–20)
CHLORIDE: 101 mmol/L (ref 101–111)
CO2: 22 mmol/L (ref 22–32)
Calcium: 8.3 mg/dL — ABNORMAL LOW (ref 8.9–10.3)
Creatinine, Ser: 1.69 mg/dL — ABNORMAL HIGH (ref 0.61–1.24)
GFR calc Af Amer: 50 mL/min — ABNORMAL LOW (ref >60)
GFR calc non Af Amer: 43 mL/min — ABNORMAL LOW (ref >60)
GLUCOSE: 94 mg/dL (ref 65–99)
POTASSIUM: 5.5 mmol/L — AB (ref 3.5–5.1)
Sodium: 136 mmol/L (ref 135–145)
TOTAL PROTEIN: 7.5 g/dL (ref 6.5–8.1)

## 2015-05-24 MED ORDER — SODIUM POLYSTYRENE SULFONATE 15 GM/60ML PO SUSP
30.0000 g | Freq: Once | ORAL | Status: AC
  Administered 2015-05-24: 30 g via ORAL
  Filled 2015-05-24: qty 120

## 2015-05-24 NOTE — Care Management Note (Signed)
Case Management Note  Patient Details  Name: [name] MRN: [id number] Date of Birth: Sep 14, 1955  Subjective/Objective:     Pt admitted with acute respiratory failure with hypoxia               Action/Plan:  Pt is independent from home.  CM received consult regarding medication needs.  CM met with pt and was told by pt that he has everything worked out, pt would not elaborate.  CM asked pt if he had active medicaid and pt stated yes.  CM will continue to monitor for disposition needs   Expected Discharge Date:                  Expected Discharge Plan:  Home/Self Care  In-House Referral:     Discharge planning Services  CM Consult  Post Acute Care Choice:    Choice offered to:     DME Arranged:    DME Agency:     HH Arranged:    HH Agency:     Status of Service:  In process, will continue to follow  Medicare Important Message Given:    Date Medicare IM Given:    Medicare IM give by:    Date Additional Medicare IM Given:    Additional Medicare Important Message give by:     If discussed at [place] of Stay Meetings, dates discussed:    Additional Comments:  Maryclare Labrador, RN 05/24/2015, 2:03 PM

## 2015-05-24 NOTE — Progress Notes (Signed)
[facility] notified of pts K+ this AM - 5.5.  New orders received.  Will cont to monitor.  Claudette Stapler, RN

## 2015-05-24 NOTE — Progress Notes (Signed)
Nutrition Brief Note  Patient identified on the Malnutrition Screening Tool (MST) Report.  Per wt readings below, pt has had a 6% weight loss since October 2016; not significant for time frame.  Wt Readings from Last 15 Encounters:  05/24/15 191 lb 9.6 oz (86.909 kg)  03/14/15 189 lb 9.6 oz (86.002 kg)  02/22/15 204 lb (92.534 kg)  02/15/15 203 lb (92.08 kg)  11/18/14 234 lb 9.6 oz (106.414 kg)  11/11/14 230 lb (104.327 kg)  11/08/14 227 lb 12.8 oz (103.329 kg)  09/02/14 227 lb 4.8 oz (103.103 kg)  07/27/14 269 lb 14.4 oz (122.426 kg)  07/11/14 270 lb 9 oz (122.726 kg)  06/24/14 250 lb (113.399 kg)  04/20/13 241 lb 1 oz (109.345 kg)  04/12/13 244 lb (110.678 kg)  03/03/13 250 lb (113.399 kg)  02/17/13 245 lb (111.131 kg)    Body mass index is 26.73 kg/(m^2). Patient meets criteria for Overweight based on current BMI.   Current diet order is Heart Healthy, patient is consuming approximately 100% of meals at this time. Labs and medications reviewed.   No nutrition interventions warranted at this time. If nutrition issues arise, please consult RD.   Arthur Holms, RD, LDN Pager #: [phone] After-Hours Pager #: [phone]

## 2015-05-24 NOTE — Progress Notes (Signed)
[facility] Progress Note   John [name]  [id number]  DOB: [birth year]  DOA: 05/22/2015 PCP: Elizabeth Palau, MD  Brief narrative: John [name] is a 60 y.o. male history of cirrhosis of the liver, polysubstance abuse including cocaine, hypertension presents to the ER because of worsening shortness of breath or chest pain. CT chest shows pneumonia.   Subjective: Cough is getting better. Significant shortness of breath on exertion. No new symptoms.   Assessment/Plan: Acute respiratory failure with hypoxia/ CAP - cont Levaquin - has been weaned off of O2 but still dropping to 88% on exertion  Active Problems:  Polysubstance abuse - advised to stop abusing   Ascites due to alcoholic cirrhosis  - paracentesis performed - 1 L of fluid removed - can resume Lasix and Aldactone tomorrow   ARF (acute renal failure) (HCC) -prerenal vs ATN-  holding lasix and Aldactone  HTN - cont Amlodipine and Hydralazine  Heart block  - had 2 episodes of short pauses (missed Beats) yesterday less than 2 sec  Antibiotics: Anti-infectives    Start     Dose/Rate Route Frequency Ordered Stop   05/23/15 0445  levofloxacin (LEVAQUIN) IVPB 750 mg     750 mg 100 mL/hr over 90 Minutes Intravenous Every 24 hours 05/23/15 0442 05/28/15 0259   05/23/15 0130  levofloxacin (LEVAQUIN) IVPB 750 mg     750 mg 100 mL/hr over 90 Minutes Intravenous  Once 05/23/15 0123 05/23/15 0323     Code Status:     Code Status Orders        Start     Ordered   05/23/15 0443  Full code   Continuous     05/23/15 0442    Code Status History    Date Active Date Inactive Code Status Order ID Comments User Context   10/31/2014 11:19 PM 11/08/2014  4:10 PM Full Code [id number]  Dellia Nims, MD Inpatient   08/24/2014  5:17 PM 09/03/2014 12:24 AM Full Code [id number]  Cherene Altes, MD Inpatient   08/16/2014  7:09 PM 08/24/2014  5:17 PM Full Code [id number]  Sandi Mariscal, MD Inpatient   08/15/2014   9:21 PM 08/16/2014  7:09 PM Full Code [id number]  Greggory Keen, MD Inpatient   08/14/2014  4:52 PM 08/15/2014  9:21 PM Full Code [id number]  Kelvin Cellar, MD Inpatient   07/27/2014 12:14 AM 07/28/2014  8:34 PM Full Code [id number]  Lavina Hamman, MD ED   06/24/2014  6:53 AM 06/29/2014  5:36 PM Full Code [id number]  Rise Patience, MD Inpatient     Family Communication:   Disposition Plan: home tomorrow DVT prophylaxis: Lovenox Consultants: none Procedures:     Objective: Filed Weights   05/23/15 1200 05/24/15 0500  Weight: 88.814 kg (195 lb 12.8 oz) 86.909 kg (191 lb 9.6 oz)    Intake/Output Summary (Last 24 hours) at 05/24/15 1010 Last data filed at 05/24/15 0800  Gross per 24 hour  Intake    240 ml  Output   3700 ml  Net  -3460 ml     Vitals Filed Vitals:   05/23/15 1440 05/23/15 2016 05/24/15 0440 05/24/15 0500  BP: 139/76 133/76 119/64   Pulse:  97 88   Temp:  98.3 F (36.8 C) 98.2 F (36.8 C)   TempSrc:  Oral Oral   Resp:  18 18   Height:      Weight:    86.909 kg (191 lb 9.6 oz)  SpO2:  93% 96%  Exam:  General:  Pt is alert, not in acute distress  HEENT: No icterus, No thrush, oral mucosa moist  Cardiovascular: regular rate and rhythm, S1/S2 No murmur  Respiratory: clear to auscultation bilaterally   Abdomen: Soft, +Bowel sounds, non tender, non distended, no guarding  MSK: No cyanosis or clubbing- no pedal edema   Data Reviewed: Basic Metabolic Panel:  Recent Labs Lab 05/22/15 1838 05/23/15 0454 05/24/15 0400  NA 135 137 136  K 3.5 3.3* 5.5*  CL 103 102 101  CO2 23 16* 22  GLUCOSE 110* 159* 94  BUN 6 11 16   CREATININE 1.36* 1.75* 1.69*  CALCIUM 9.0 8.9 8.3*   Liver Function Tests:  Recent Labs Lab 05/22/15 1838 05/23/15 0454 05/24/15 0400  AST 58* 61* 43*  ALT 30 28 25   ALKPHOS 99 92 92  BILITOT 0.9 0.6 0.7  PROT 7.5 7.3 7.5  ALBUMIN 2.7* 2.6* 2.5*   No results for input(s): LIPASE, AMYLASE in the last 168 hours. No  results for input(s): AMMONIA in the last 168 hours. CBC:  Recent Labs Lab 05/22/15 1838 05/23/15 0454 05/24/15 0400 05/24/15 0923  WBC 5.1 13.2* 17.9* 16.4*  NEUTROABS 2.8  --   --   --   HGB 11.7* 11.2* 10.4* 11.1*  HCT 34.7* 32.8* 30.1* 32.5*  MCV 82.2 82.6 81.8 83.5  PLT 145* 146* 116* 127*   Cardiac Enzymes:  Recent Labs Lab 05/22/15 1838 05/23/15 0454 05/23/15 1320 05/23/15 2048  TROPONINI <0.03 0.03 <0.03 0.04*   BNP (last 3 results)  Recent Labs  06/24/14 0802 10/31/14 1856  BNP 1168.5* 398.9*    ProBNP (last 3 results) No results for input(s): PROBNP in the last 8760 hours.  CBG: No results for input(s): GLUCAP in the last 168 hours.  Recent Results (from the past 240 hour(s))  Gram stain     Status: None   Collection Time: 05/23/15  2:33 PM  Result Value Ref Range Status   Specimen Description FLUID ASCITIC  Final   Special Requests NONE  Final   Gram Stain   Final    CYTOSPIN SMEAR WBC PRESENT,BOTH PMN AND MONONUCLEAR NO ORGANISMS SEEN    Report Status 05/23/2015 FINAL  Final  Culture, sputum-assessment     Status: None   Collection Time: 05/23/15  5:10 PM  Result Value Ref Range Status   Specimen Description SPUTUM  Final   Special Requests NONE  Final   Sputum evaluation   Final    THIS SPECIMEN IS ACCEPTABLE. RESPIRATORY CULTURE REPORT TO FOLLOW.   Report Status 05/23/2015 FINAL  Final  Culture, respiratory (NON-Expectorated)     Status: None (Preliminary result)   Collection Time: 05/23/15  5:10 PM  Result Value Ref Range Status   Specimen Description SPUTUM  Final   Special Requests NONE  Final   Gram Stain   Final    ABUNDANT WBC PRESENT, PREDOMINANTLY PMN RARE SQUAMOUS EPITHELIAL CELLS PRESENT FEW GRAM POSITIVE COCCI IN PAIRS FEW GRAM VARIABLE ROD Performed at [facility]    Culture   Final    NO GROWTH 1 DAY Performed at [facility]    Report Status PENDING  Incomplete     Studies: Dg Chest 2  View  05/22/2015  CLINICAL DATA:  Pt states he has been sick x 3-4 days, SOB, weakness, cough/cong, chest pain, HTN, ex-smoker. Hx stroke and CHF EXAM: CHEST  2 VIEW COMPARISON:  10/31/2014 FINDINGS: Heart is mildly enlarged. There are no focal  consolidations or pleural effusions. Mild perihilar peribronchial thickening is noted. No pulmonary edema. Vascular coils are noted in the left upper quadrant of the abdomen. IMPRESSION: 1. Cardiomegaly. 2. Bronchitic changes. Electronically Signed   By: Nolon Nations M.D.   On: 05/22/2015 17:22   Ct Angio Chest Pe W/cm &/or Wo Cm  05/23/2015  CLINICAL DATA:  Shortness of breath after walking. Abdominal distention. 90% on room air. EXAM: CT ANGIOGRAPHY CHEST WITH CONTRAST TECHNIQUE: Multidetector CT imaging of the chest was performed using the standard protocol during bolus administration of intravenous contrast. Multiplanar CT image reconstructions and MIPs were obtained to evaluate the vascular anatomy. CONTRAST:  113mL OMNIPAQUE IOHEXOL 350 MG/ML SOLN COMPARISON:  None. FINDINGS: There is moderately good opacification of the central and proximal segmental pulmonary arteries. No large central filling defects are demonstrated suggesting no significant pulmonary embolus. Peripheral emboli are not excluded due to technique. Mild cardiac enlargement. Normal caliber thoracic aorta. No aortic dissection. Great vessel origins are patent. Esophagus is decompressed. Calcified lymph nodes in the right hilum. Consolidation with air bronchograms in the right lower lung with patchy airspace disease scattered throughout the right lung. Changes likely represent pneumonia. No pleural effusions. No pneumothorax. Left lung is grossly clear. Included portions of the upper abdominal organs demonstrate changes of hepatic cirrhosis. Diffuse upper abdominal fluid is likely ascites. Surgical clips at the EG junction. No destructive bone lesions. Review of the MIP images confirms the above  findings. IMPRESSION: No evidence of significant central pulmonary embolus although limited contrast bolus limits evaluation of peripheral pulmonary arteries. Consolidation and patchy airspace disease in the right lung likely representing pneumonia. Changes of hepatic cirrhosis with upper abdominal ascites. Electronically Signed   By: Lucienne Capers M.D.   On: 05/23/2015 00:52   US Paracentesis  05/23/2015  INDICATION: Abdominal distention. Ascites. Request diagnostic and therapeutic paracentesis. EXAM: ULTRASOUND GUIDED RIGHT LOWER QUADRANT PARACENTESIS MEDICATIONS: None. ANESTHESIA/SEDATION: Moderate Sedation Time:  None COMPLICATIONS: None immediate. PROCEDURE: Informed written consent was obtained from the patient after a discussion of the risks, benefits and alternatives to treatment. A timeout was performed prior to the initiation of the procedure. Initial ultrasound scanning demonstrates a large amount of ascites within the right lower abdominal quadrant. The right lower abdomen was prepped and draped in the usual sterile fashion. 1% lidocaine with epinephrine was used for local anesthesia. Under direct ultrasound guidance, a 19 gauge, 7-cm, Yueh catheter was introduced. An ultrasound image was saved for documentation purposes. The paracentesis was performed. The catheter was removed and a dressing was applied. The patient tolerated the procedure well without immediate post procedural complication. FINDINGS: A total of approximately 1 L of clear yellow fluid was removed. Samples were sent to the laboratory as requested by the clinical team. IMPRESSION: Successful ultrasound-guided paracentesis yielding 1 liters of peritoneal fluid. Read by: Ascencion Dike PA-C Electronically Signed   By: Lucrezia Europe M.D.   On: 05/23/2015 14:58    Scheduled Meds:  Scheduled Meds: . amLODipine  10 mg Oral Daily  . enoxaparin (LOVENOX) injection  40 mg Subcutaneous Daily  . hydrALAZINE  50 mg Oral TID  . Influenza  vac split quadrivalent PF  0.5 mL Intramuscular Tomorrow-1000  . levofloxacin (LEVAQUIN) IV  750 mg Intravenous Q24H  . sodium chloride  3 mL Intravenous Q12H   Continuous Infusions:   Time spent on care of this patient: 35 min   [place], MD 05/24/2015, 10:10 AM  LOS: 1 day   [facility] Office  [phone] Pager - Text Page per [email or web address] If 7PM-7AM, please contact night-coverage [email or web address]

## 2015-05-25 DIAGNOSIS — N183 Chronic kidney disease, stage 3 unspecified: Secondary | ICD-10-CM

## 2015-05-25 DIAGNOSIS — N1831 Chronic kidney disease, stage 3a: Secondary | ICD-10-CM

## 2015-05-25 LAB — CULTURE, RESPIRATORY W GRAM STAIN: Culture: NORMAL

## 2015-05-25 MED ORDER — FUROSEMIDE 40 MG PO TABS: 40.0000 mg | ORAL_TABLET | Freq: Two times a day (BID) | ORAL | Status: DC

## 2015-05-25 MED ORDER — LEVOFLOXACIN 750 MG PO TABS: 750.0000 mg | ORAL_TABLET | Freq: Every day | ORAL | Status: DC

## 2015-05-25 MED ORDER — SPIRONOLACTONE 100 MG PO TABS: 100.0000 mg | ORAL_TABLET | Freq: Every day | ORAL | Status: DC

## 2015-05-25 MED ORDER — HYDRALAZINE HCL 50 MG PO TABS: 50.0000 mg | ORAL_TABLET | Freq: Three times a day (TID) | ORAL | Status: DC

## 2015-05-25 MED ORDER — AMLODIPINE BESYLATE 10 MG PO TABS: 10.0000 mg | ORAL_TABLET | Freq: Every day | ORAL | Status: DC

## 2015-05-25 NOTE — Care Management Note (Signed)
Case Management Note  Patient Details  Name: John [name] MRN: [id number] Date of Birth: 1955/10/23  Subjective/Objective:     Pt admitted with acute respiratory failure with hypoxia               Action/Plan:  Pt is independent from home.  CM received consult regarding medication needs.  CM met with pt and was told by pt that he has everything worked out, pt would not elaborate.  CM asked pt if he had active medicaid and pt stated yes.  CM will continue to monitor for disposition needs   Expected Discharge Date:                  Expected Discharge Plan:  Home/Self Care  In-House Referral:     Discharge planning Services  CM Consult  Post Acute Care Choice:    Choice offered to:     DME Arranged:    DME Agency:     HH Arranged:    [place] Agency:     Status of Service:  Complete, will sign off  Medicare Important Message Given:    Date Medicare IM Given:    Medicare IM give by:    Date Additional Medicare IM Given:    Additional Medicare Important Message give by:     If discussed at [place] of Stay Meetings, dates discussed:    Additional Comments:  Maryclare Labrador, RN 05/25/2015, 8:34 AM

## 2015-05-25 NOTE — Discharge Summary (Signed)
Physician Discharge Summary  John [name] [id number] DOB: [birth year] DOA: 05/22/2015  PCP: Elizabeth Palau, MD  Admit date: 05/22/2015 Discharge date: 05/25/2015  Time spent: 50 minutes  Recommendations for Outpatient Follow-up:  1.  CBC in 1 wk  Discharge Condition: stable    Discharge Diagnoses:  Principal Problem:   Acute respiratory failure with hypoxia ([place]) Active Problems:   CAP (community acquired pneumonia)   Polysubstance abuse   Ascites due to alcoholic cirrhosis ([place])   Chronic kidney disease, stage 3   History of present illness:  John [name] is a 59 y.o. male history of cirrhosis of the liver, ascites, polysubstance abuse including cocaine, hypertension presents to the ER because of worsening shortness of breath and chest pain. CT chest shows pneumonia.  Hospital Course:  Acute respiratory failure with hypoxia/ CAP - WBC count still elevated but cough and dyspnea resolved completely- pulse ox 97% on room air- ambulating without difficulty - cont Levaquin to complete 7 day course  - repeat CBC in 1 wk- he is not allowing blood work today  Active Problems:  Polysubstance abuse - cocaine abuse- advised to stop abusing   Ascites due to alcoholic cirrhosis  - paracentesis performed - 1 L of fluid removed - he was not taking any of his medications at home- resumed his Lasix and Aldactone and have advised to closely follow abdominal size, limit fluids and salt and notify his PCP if he appears to be gaining fluid - of note, prior med list mentions Lactulose and Xifaxin which he states he was not taking at home- as he has not developed hepatic encephalopathy, I have not resumed these medications.   Mild hyperkalemia - noted on labs yesterday - given Kayexalate resulting in bowel movements- he did not allow for a blood drawl to re-check it today  CKD 3 - stable  HTN - cont Amlodipine and Hydralazine-new prescriptions given as he was not taking  meds   Discharge Exam: Filed Weights   05/23/15 1200 05/24/15 0500 05/25/15 0459  Weight: 88.814 kg (195 lb 12.8 oz) 86.909 kg (191 lb 9.6 oz) 82.827 kg (182 lb 9.6 oz)   Filed Vitals:   05/25/15 0459 05/25/15 0958  BP: 132/78 135/75  Pulse: 75   Temp: 98.6 F (37 C)   Resp: 18     General: AAO x 3, no distress Cardiovascular: RRR, no murmurs  Respiratory: clear to auscultation bilaterally GI: soft, non-tender, non-distended, bowel sound positive  Discharge Instructions You were cared for by a hospitalist during your hospital stay. If you have any questions about your discharge medications or the care you received while you were in the hospital after you are discharged, you can call the unit and asked to speak with the hospitalist on call if the hospitalist that took care of you is not available. Once you are discharged, your primary care physician will handle any further medical issues. Please note that NO REFILLS for any discharge medications will be authorized once you are discharged, as it is imperative that you return to your primary care physician (or establish a relationship with a primary care physician if you do not have one) for your aftercare needs so that they can reassess your need for medications and monitor your lab values.      Discharge Instructions    Diet - low sodium heart healthy    Complete by:  As directed      Increase activity slowly    Complete by:  As directed  Medication List    TAKE these medications        amLODipine 10 MG tablet  Commonly known as:  NORVASC  Take 1 tablet (10 mg total) by mouth daily.     furosemide 40 MG tablet  Commonly known as:  LASIX  Take 1 tablet (40 mg total) by mouth 2 (two) times daily.     hydrALAZINE 50 MG tablet  Commonly known as:  APRESOLINE  Take 1 tablet (50 mg total) by mouth 3 (three) times daily.     levofloxacin 750 MG tablet  Commonly known as:  LEVAQUIN  Take 1 tablet (750 mg total)  by mouth daily.     spironolactone 100 MG tablet  Commonly known as:  ALDACTONE  Take 1 tablet (100 mg total) by mouth daily.       Allergies  Allergen Reactions  . Penicillins     Convulsions, Patient could not walk  Has patient had a PCN reaction causing immediate rash, facial/tongue/throat swelling, SOB or lightheadedness with hypotension: No Has patient had a PCN reaction causing severe rash involving mucus membranes or skin necrosis: No Has patient had a PCN reaction that required hospitalization No Has patient had a PCN reaction occurring within the last 10 years: No If all of the above answers are "NO", then may proceed with Cephalosporin use.      The results of significant diagnostics from this hospitalization (including imaging, microbiology, ancillary and laboratory) are listed below for reference.    Significant Diagnostic Studies: Dg Chest 2 View  05/22/2015  CLINICAL DATA:  Pt states he has been sick x 3-4 days, SOB, weakness, cough/cong, chest pain, HTN, ex-smoker. Hx stroke and CHF EXAM: CHEST  2 VIEW COMPARISON:  10/31/2014 FINDINGS: Heart is mildly enlarged. There are no focal consolidations or pleural effusions. Mild perihilar peribronchial thickening is noted. No pulmonary edema. Vascular coils are noted in the left upper quadrant of the abdomen. IMPRESSION: 1. Cardiomegaly. 2. Bronchitic changes. Electronically Signed   By: Nolon Nations M.D.   On: 05/22/2015 17:22   Ct Angio Chest Pe W/cm &/or Wo Cm  05/23/2015  CLINICAL DATA:  Shortness of breath after walking. Abdominal distention. 90% on room air. EXAM: CT ANGIOGRAPHY CHEST WITH CONTRAST TECHNIQUE: Multidetector CT imaging of the chest was performed using the standard protocol during bolus administration of intravenous contrast. Multiplanar CT image reconstructions and MIPs were obtained to evaluate the vascular anatomy. CONTRAST:  168mL OMNIPAQUE IOHEXOL 350 MG/ML SOLN COMPARISON:  None. FINDINGS: There is  moderately good opacification of the central and proximal segmental pulmonary arteries. No large central filling defects are demonstrated suggesting no significant pulmonary embolus. Peripheral emboli are not excluded due to technique. Mild cardiac enlargement. Normal caliber thoracic aorta. No aortic dissection. Great vessel origins are patent. Esophagus is decompressed. Calcified lymph nodes in the right hilum. Consolidation with air bronchograms in the right lower lung with patchy airspace disease scattered throughout the right lung. Changes likely represent pneumonia. No pleural effusions. No pneumothorax. Left lung is grossly clear. Included portions of the upper abdominal organs demonstrate changes of hepatic cirrhosis. Diffuse upper abdominal fluid is likely ascites. Surgical clips at the EG junction. No destructive bone lesions. Review of the MIP images confirms the above findings. IMPRESSION: No evidence of significant central pulmonary embolus although limited contrast bolus limits evaluation of peripheral pulmonary arteries. Consolidation and patchy airspace disease in the right lung likely representing pneumonia. Changes of hepatic cirrhosis with upper abdominal ascites. Electronically Signed  By: Lucienne Capers M.D.   On: 05/23/2015 00:52   US Paracentesis  05/23/2015  INDICATION: Abdominal distention. Ascites. Request diagnostic and therapeutic paracentesis. EXAM: ULTRASOUND GUIDED RIGHT LOWER QUADRANT PARACENTESIS MEDICATIONS: None. ANESTHESIA/SEDATION: Moderate Sedation Time:  None COMPLICATIONS: None immediate. PROCEDURE: Informed written consent was obtained from the patient after a discussion of the risks, benefits and alternatives to treatment. A timeout was performed prior to the initiation of the procedure. Initial ultrasound scanning demonstrates a large amount of ascites within the right lower abdominal quadrant. The right lower abdomen was prepped and draped in the usual sterile  fashion. 1% lidocaine with epinephrine was used for local anesthesia. Under direct ultrasound guidance, a 19 gauge, 7-cm, Yueh catheter was introduced. An ultrasound image was saved for documentation purposes. The paracentesis was performed. The catheter was removed and a dressing was applied. The patient tolerated the procedure well without immediate post procedural complication. FINDINGS: A total of approximately 1 L of clear yellow fluid was removed. Samples were sent to the laboratory as requested by the clinical team. IMPRESSION: Successful ultrasound-guided paracentesis yielding 1 liters of peritoneal fluid. Read by: Ascencion Dike PA-C Electronically Signed   By: Lucrezia Europe M.D.   On: 05/23/2015 14:58    Microbiology: Recent Results (from the past 240 hour(s))  Culture, blood (routine x 2) Call MD if unable to obtain prior to antibiotics being given     Status: None (Preliminary result)   Collection Time: 05/23/15  4:54 AM  Result Value Ref Range Status   Specimen Description BLOOD LEFT ARM  Final   Special Requests BOTTLES DRAWN AEROBIC AND ANAEROBIC 5ML  Final   Culture NO GROWTH 1 DAY  Final   Report Status PENDING  Incomplete  Culture, blood (routine x 2) Call MD if unable to obtain prior to antibiotics being given     Status: None (Preliminary result)   Collection Time: 05/23/15  4:59 AM  Result Value Ref Range Status   Specimen Description BLOOD RIGHT ARM  Final   Special Requests BOTTLES DRAWN AEROBIC AND ANAEROBIC 5ML  Final   Culture NO GROWTH 1 DAY  Final   Report Status PENDING  Incomplete  Gram stain     Status: None   Collection Time: 05/23/15  2:33 PM  Result Value Ref Range Status   Specimen Description FLUID ASCITIC  Final   Special Requests NONE  Final   Gram Stain   Final    CYTOSPIN SMEAR WBC PRESENT,BOTH PMN AND MONONUCLEAR NO ORGANISMS SEEN    Report Status 05/23/2015 FINAL  Final  Culture, body fluid-bottle     Status: None (Preliminary result)   Collection  Time: 05/23/15  2:33 PM  Result Value Ref Range Status   Specimen Description FLUID ASCITIC  Final   Special Requests BOTTLES DRAWN AEROBIC AND ANAEROBIC 10CC  Final   Culture NO GROWTH < 24 HOURS  Final   Report Status PENDING  Incomplete  Culture, sputum-assessment     Status: None   Collection Time: 05/23/15  5:10 PM  Result Value Ref Range Status   Specimen Description SPUTUM  Final   Special Requests NONE  Final   Sputum evaluation   Final    THIS SPECIMEN IS ACCEPTABLE. RESPIRATORY CULTURE REPORT TO FOLLOW.   Report Status 05/23/2015 FINAL  Final  Culture, respiratory (NON-Expectorated)     Status: None   Collection Time: 05/23/15  5:10 PM  Result Value Ref Range Status   Specimen Description SPUTUM  Final  Special Requests NONE  Final   Gram Stain   Final    ABUNDANT WBC PRESENT, PREDOMINANTLY PMN RARE SQUAMOUS EPITHELIAL CELLS PRESENT FEW GRAM POSITIVE COCCI IN PAIRS FEW GRAM VARIABLE ROD Performed at [facility]    Culture   Final    NORMAL OROPHARYNGEAL FLORA Performed at [facility]    Report Status 05/25/2015 FINAL  Final     Labs: Basic Metabolic Panel:  Recent Labs Lab 05/22/15 1838 05/23/15 0454 05/24/15 0400  NA 135 137 136  K 3.5 3.3* 5.5*  CL 103 102 101  CO2 23 16* 22  GLUCOSE 110* 159* 94  BUN 6 11 16   CREATININE 1.36* 1.75* 1.69*  CALCIUM 9.0 8.9 8.3*   Liver Function Tests:  Recent Labs Lab 05/22/15 1838 05/23/15 0454 05/24/15 0400  AST 58* 61* 43*  ALT 30 28 25   ALKPHOS 99 92 92  BILITOT 0.9 0.6 0.7  PROT 7.5 7.3 7.5  ALBUMIN 2.7* 2.6* 2.5*   No results for input(s): LIPASE, AMYLASE in the last 168 hours. No results for input(s): AMMONIA in the last 168 hours. CBC:  Recent Labs Lab 05/22/15 1838 05/23/15 0454 05/24/15 0400 05/24/15 0923  WBC 5.1 13.2* 17.9* 16.4*  NEUTROABS 2.8  --   --   --   HGB 11.7* 11.2* 10.4* 11.1*  HCT 34.7* 32.8* 30.1* 32.5*  MCV 82.2 82.6 81.8 83.5  PLT 145* 146* 116*  127*   Cardiac Enzymes:  Recent Labs Lab 05/22/15 1838 05/23/15 0454 05/23/15 1320 05/23/15 2048  TROPONINI <0.03 0.03 <0.03 0.04*   BNP: BNP (last 3 results)  Recent Labs  06/24/14 0802 10/31/14 1856  BNP 1168.5* 398.9*    ProBNP (last 3 results) No results for input(s): PROBNP in the last 8760 hours.  CBG: No results for input(s): GLUCAP in the last 168 hours.     SignedDebbe Odea, MD [facility] 05/25/2015, 1:00 PM

## 2015-05-25 NOTE — Progress Notes (Signed)
Pt. Discharged to home  Pt. D/C'd via wheelchair with volunteers Discharge information reviewed and given All personal belongings given to Pt.  Education discussed and understood IV was d/c and intact upon removal Tele d/c

## 2015-05-28 LAB — CULTURE, BLOOD (ROUTINE X 2)
CULTURE: NO GROWTH
Culture: NO GROWTH

## 2015-05-28 LAB — CULTURE, BODY FLUID W GRAM STAIN -BOTTLE

## 2015-05-28 LAB — CULTURE, BODY FLUID-BOTTLE: CULTURE: NO GROWTH

## 2015-06-14 ENCOUNTER — Inpatient Hospital Stay (HOSPITAL_COMMUNITY): Admission: RE | Admit: 2015-06-14 | Payer: Medicaid Other | Source: Ambulatory Visit

## 2015-06-20 ENCOUNTER — Other Ambulatory Visit: Payer: Self-pay | Admitting: Internal Medicine

## 2015-07-07 ENCOUNTER — Encounter (HOSPITAL_COMMUNITY): Payer: Self-pay | Admitting: Emergency Medicine

## 2015-07-07 ENCOUNTER — Emergency Department (HOSPITAL_COMMUNITY): Payer: Medicaid Other

## 2015-07-07 ENCOUNTER — Inpatient Hospital Stay (HOSPITAL_COMMUNITY): Payer: Medicaid Other

## 2015-07-07 ENCOUNTER — Inpatient Hospital Stay (HOSPITAL_COMMUNITY)
Admission: EM | Admit: 2015-07-07 | Discharge: 2015-07-10 | DRG: 896 | Disposition: A | Discharge status: Home Health | Payer: Medicaid Other | Attending: Internal Medicine | Admitting: Internal Medicine

## 2015-07-07 DIAGNOSIS — R7989 Other specified abnormal findings of blood chemistry: Secondary | ICD-10-CM | POA: Diagnosis present

## 2015-07-07 DIAGNOSIS — R079 Chest pain, unspecified: Secondary | ICD-10-CM | POA: Diagnosis not present

## 2015-07-07 DIAGNOSIS — N183 Chronic kidney disease, stage 3 unspecified: Secondary | ICD-10-CM | POA: Diagnosis present

## 2015-07-07 DIAGNOSIS — Z9119 Patient's noncompliance with other medical treatment and regimen: Secondary | ICD-10-CM | POA: Diagnosis not present

## 2015-07-07 DIAGNOSIS — Z8673 Personal history of transient ischemic attack (TIA), and cerebral infarction without residual deficits: Secondary | ICD-10-CM | POA: Diagnosis not present

## 2015-07-07 DIAGNOSIS — Z87891 Personal history of nicotine dependence: Secondary | ICD-10-CM

## 2015-07-07 DIAGNOSIS — N1831 Chronic kidney disease, stage 3a: Secondary | ICD-10-CM | POA: Diagnosis present

## 2015-07-07 DIAGNOSIS — E440 Moderate protein-calorie malnutrition: Secondary | ICD-10-CM | POA: Diagnosis present

## 2015-07-07 DIAGNOSIS — D696 Thrombocytopenia, unspecified: Secondary | ICD-10-CM | POA: Diagnosis present

## 2015-07-07 DIAGNOSIS — J9601 Acute respiratory failure with hypoxia: Secondary | ICD-10-CM | POA: Diagnosis not present

## 2015-07-07 DIAGNOSIS — F14188 Cocaine abuse with other cocaine-induced disorder: Principal | ICD-10-CM | POA: Diagnosis present

## 2015-07-07 DIAGNOSIS — K7030 Alcoholic cirrhosis of liver without ascites: Secondary | ICD-10-CM | POA: Diagnosis not present

## 2015-07-07 DIAGNOSIS — E860 Dehydration: Secondary | ICD-10-CM | POA: Diagnosis not present

## 2015-07-07 DIAGNOSIS — F1910 Other psychoactive substance abuse, uncomplicated: Secondary | ICD-10-CM | POA: Diagnosis present

## 2015-07-07 DIAGNOSIS — K7031 Alcoholic cirrhosis of liver with ascites: Secondary | ICD-10-CM

## 2015-07-07 DIAGNOSIS — N179 Acute kidney failure, unspecified: Secondary | ICD-10-CM | POA: Diagnosis present

## 2015-07-07 DIAGNOSIS — F1021 Alcohol dependence, in remission: Secondary | ICD-10-CM | POA: Diagnosis present

## 2015-07-07 DIAGNOSIS — I5032 Chronic diastolic (congestive) heart failure: Secondary | ICD-10-CM | POA: Diagnosis present

## 2015-07-07 DIAGNOSIS — K7681 Hepatopulmonary syndrome: Secondary | ICD-10-CM | POA: Diagnosis present

## 2015-07-07 DIAGNOSIS — I864 Gastric varices: Secondary | ICD-10-CM | POA: Diagnosis present

## 2015-07-07 DIAGNOSIS — G8929 Other chronic pain: Secondary | ICD-10-CM | POA: Diagnosis present

## 2015-07-07 DIAGNOSIS — B1920 Unspecified viral hepatitis C without hepatic coma: Secondary | ICD-10-CM | POA: Diagnosis present

## 2015-07-07 DIAGNOSIS — K922 Gastrointestinal hemorrhage, unspecified: Secondary | ICD-10-CM | POA: Diagnosis present

## 2015-07-07 DIAGNOSIS — J9600 Acute respiratory failure, unspecified whether with hypoxia or hypercapnia: Secondary | ICD-10-CM | POA: Diagnosis not present

## 2015-07-07 DIAGNOSIS — R778 Other specified abnormalities of plasma proteins: Secondary | ICD-10-CM | POA: Diagnosis present

## 2015-07-07 DIAGNOSIS — Z8719 Personal history of other diseases of the digestive system: Secondary | ICD-10-CM

## 2015-07-07 DIAGNOSIS — Z6825 Body mass index (BMI) 25.0-25.9, adult: Secondary | ICD-10-CM

## 2015-07-07 DIAGNOSIS — R188 Other ascites: Secondary | ICD-10-CM | POA: Diagnosis present

## 2015-07-07 DIAGNOSIS — H468 Other optic neuritis: Secondary | ICD-10-CM | POA: Diagnosis present

## 2015-07-07 DIAGNOSIS — E871 Hypo-osmolality and hyponatremia: Secondary | ICD-10-CM | POA: Diagnosis present

## 2015-07-07 DIAGNOSIS — Z8249 Family history of ischemic heart disease and other diseases of the circulatory system: Secondary | ICD-10-CM | POA: Diagnosis not present

## 2015-07-07 DIAGNOSIS — K7460 Unspecified cirrhosis of liver: Secondary | ICD-10-CM | POA: Diagnosis present

## 2015-07-07 DIAGNOSIS — J9811 Atelectasis: Secondary | ICD-10-CM | POA: Diagnosis present

## 2015-07-07 DIAGNOSIS — S27399A Other injuries of lung, unspecified, initial encounter: Secondary | ICD-10-CM | POA: Diagnosis present

## 2015-07-07 DIAGNOSIS — R0602 Shortness of breath: Secondary | ICD-10-CM | POA: Diagnosis present

## 2015-07-07 DIAGNOSIS — I130 Hypertensive heart and chronic kidney disease with heart failure and stage 1 through stage 4 chronic kidney disease, or unspecified chronic kidney disease: Secondary | ICD-10-CM | POA: Diagnosis present

## 2015-07-07 LAB — I-STAT ARTERIAL BLOOD GAS, ED
ACID-BASE DEFICIT: 1 mmol/L (ref 0.0–2.0)
Bicarbonate: 21.3 mEq/L (ref 20.0–24.0)
O2 SAT: 94 %
PCO2 ART: 28.8 mmHg — AB (ref 35.0–45.0)
TCO2: 22 mmol/L (ref 0–100)
pH, Arterial: 7.476 — ABNORMAL HIGH (ref 7.350–7.450)
pO2, Arterial: 66 mmHg — ABNORMAL LOW (ref 80.0–100.0)

## 2015-07-07 LAB — URINE MICROSCOPIC-ADD ON
Bacteria, UA: NONE SEEN
SQUAMOUS EPITHELIAL / LPF: NONE SEEN
WBC, UA: NONE SEEN WBC/hpf (ref 0–5)

## 2015-07-07 LAB — CBC WITH DIFFERENTIAL/PLATELET
BASOS ABS: 0.1 10*3/uL (ref 0.0–0.1)
Basophils Relative: 1 %
EOS ABS: 0 10*3/uL (ref 0.0–0.7)
EOS PCT: 0 %
HCT: 36.8 % — ABNORMAL LOW (ref 39.0–52.0)
Hemoglobin: 13.4 g/dL (ref 13.0–17.0)
LYMPHS PCT: 31 %
Lymphs Abs: 1.7 10*3/uL (ref 0.7–4.0)
MCH: 28.7 pg (ref 26.0–34.0)
MCHC: 36.4 g/dL — ABNORMAL HIGH (ref 30.0–36.0)
MCV: 78.8 fL (ref 78.0–100.0)
MONO ABS: 1.2 10*3/uL — AB (ref 0.1–1.0)
Monocytes Relative: 22 %
Neutro Abs: 2.4 10*3/uL (ref 1.7–7.7)
Neutrophils Relative %: 46 %
PLATELETS: 120 10*3/uL — AB (ref 150–400)
RBC: 4.67 MIL/uL (ref 4.22–5.81)
RDW: 13.7 % (ref 11.5–15.5)
WBC: 5.4 10*3/uL (ref 4.0–10.5)

## 2015-07-07 LAB — COMPREHENSIVE METABOLIC PANEL
ALBUMIN: 3.4 g/dL — AB (ref 3.5–5.0)
ALK PHOS: 112 U/L (ref 38–126)
ALT: 50 U/L (ref 17–63)
ANION GAP: 14 (ref 5–15)
AST: 82 U/L — ABNORMAL HIGH (ref 15–41)
BILIRUBIN TOTAL: 1.3 mg/dL — AB (ref 0.3–1.2)
BUN: 37 mg/dL — ABNORMAL HIGH (ref 6–20)
CALCIUM: 9.1 mg/dL (ref 8.9–10.3)
CO2: 20 mmol/L — ABNORMAL LOW (ref 22–32)
Chloride: 86 mmol/L — ABNORMAL LOW (ref 101–111)
Creatinine, Ser: 2.07 mg/dL — ABNORMAL HIGH (ref 0.61–1.24)
GFR, EST AFRICAN AMERICAN: 39 mL/min — AB (ref >60)
GFR, EST NON AFRICAN AMERICAN: 34 mL/min — AB (ref >60)
GLUCOSE: 102 mg/dL — AB (ref 65–99)
Potassium: 4.8 mmol/L (ref 3.5–5.1)
Sodium: 120 mmol/L — ABNORMAL LOW (ref 135–145)
TOTAL PROTEIN: 8.1 g/dL (ref 6.5–8.1)

## 2015-07-07 LAB — RAPID URINE DRUG SCREEN, HOSP PERFORMED
AMPHETAMINES: NOT DETECTED
BARBITURATES: NOT DETECTED
BENZODIAZEPINES: NOT DETECTED
Cocaine: POSITIVE — AB
Opiates: NOT DETECTED
Tetrahydrocannabinol: NOT DETECTED

## 2015-07-07 LAB — TROPONIN I
TROPONIN I: 0.03 ng/mL (ref <0.031)
TROPONIN I: 0.04 ng/mL — AB (ref <0.031)
Troponin I: 0.04 ng/mL — ABNORMAL HIGH (ref <0.031)
Troponin I: 0.03 ng/mL (ref <0.031)

## 2015-07-07 LAB — INFLUENZA PANEL BY PCR (TYPE A & B)
H1N1 flu by pcr: NOT DETECTED
Influenza A By PCR: NEGATIVE
Influenza B By PCR: NEGATIVE

## 2015-07-07 LAB — URINALYSIS, ROUTINE W REFLEX MICROSCOPIC
Bilirubin Urine: NEGATIVE
GLUCOSE, UA: NEGATIVE mg/dL
Ketones, ur: NEGATIVE mg/dL
Leukocytes, UA: NEGATIVE
Nitrite: NEGATIVE
PROTEIN: NEGATIVE mg/dL
Specific Gravity, Urine: 1.004 — ABNORMAL LOW (ref 1.005–1.030)
pH: 7 (ref 5.0–8.0)

## 2015-07-07 LAB — STREP PNEUMONIAE URINARY ANTIGEN: STREP PNEUMO URINARY ANTIGEN: NEGATIVE

## 2015-07-07 LAB — APTT: aPTT: 31 seconds (ref 24–37)

## 2015-07-07 LAB — PROCALCITONIN: PROCALCITONIN: 0.11 ng/mL

## 2015-07-07 LAB — PROTIME-INR
INR: 1.19 (ref 0.00–1.49)
PROTHROMBIN TIME: 15.2 s (ref 11.6–15.2)

## 2015-07-07 LAB — LACTIC ACID, PLASMA
LACTIC ACID, VENOUS: 1 mmol/L (ref 0.5–2.0)
Lactic Acid, Venous: 1 mmol/L (ref 0.5–2.0)

## 2015-07-07 LAB — BRAIN NATRIURETIC PEPTIDE: B NATRIURETIC PEPTIDE 5: 65.5 pg/mL (ref 0.0–100.0)

## 2015-07-07 LAB — OSMOLALITY: OSMOLALITY: 265 mosm/kg — AB (ref 275–295)

## 2015-07-07 LAB — OSMOLALITY, URINE: Osmolality, Ur: 110 mOsm/kg — ABNORMAL LOW (ref 300–900)

## 2015-07-07 LAB — AMMONIA: AMMONIA: 57 umol/L — AB (ref 9–35)

## 2015-07-07 LAB — SODIUM, URINE, RANDOM: SODIUM UR: 28 mmol/L

## 2015-07-07 LAB — MRSA PCR SCREENING: MRSA by PCR: NEGATIVE

## 2015-07-07 MED ORDER — ADULT MULTIVITAMIN W/MINERALS CH
1.0000 | ORAL_TABLET | Freq: Every day | ORAL | Status: DC
  Administered 2015-07-08 – 2015-07-10 (×3): 1 via ORAL
  Filled 2015-07-07 (×3): qty 1

## 2015-07-07 MED ORDER — DEXTROSE 5 % IV SOLN
1.0000 g | INTRAVENOUS | Status: DC
  Administered 2015-07-07: 1 g via INTRAVENOUS
  Filled 2015-07-07: qty 10

## 2015-07-07 MED ORDER — LORAZEPAM 2 MG/ML IJ SOLN: 1.0000 mg | Freq: Four times a day (QID) | INTRAMUSCULAR | Status: DC | PRN

## 2015-07-07 MED ORDER — OSELTAMIVIR PHOSPHATE 30 MG PO CAPS
30.0000 mg | ORAL_CAPSULE | Freq: Two times a day (BID) | ORAL | Status: DC
  Administered 2015-07-07: 30 mg via ORAL
  Filled 2015-07-07: qty 1

## 2015-07-07 MED ORDER — NITROGLYCERIN 0.4 MG SL SUBL: 0.4000 mg | SUBLINGUAL_TABLET | SUBLINGUAL | Status: DC | PRN

## 2015-07-07 MED ORDER — AMLODIPINE BESYLATE 10 MG PO TABS
10.0000 mg | ORAL_TABLET | Freq: Every day | ORAL | Status: DC
  Administered 2015-07-07 – 2015-07-10 (×4): 10 mg via ORAL
  Filled 2015-07-07 (×3): qty 1
  Filled 2015-07-07: qty 2

## 2015-07-07 MED ORDER — ASPIRIN 81 MG PO CHEW
81.0000 mg | CHEWABLE_TABLET | Freq: Every day | ORAL | Status: DC
  Administered 2015-07-08 – 2015-07-10 (×3): 81 mg via ORAL
  Filled 2015-07-07 (×3): qty 1

## 2015-07-07 MED ORDER — LIDOCAINE HCL (PF) 1 % IJ SOLN
INTRAMUSCULAR | Status: AC
  Filled 2015-07-07: qty 10

## 2015-07-07 MED ORDER — VITAMIN B-1 100 MG PO TABS
100.0000 mg | ORAL_TABLET | Freq: Every day | ORAL | Status: DC
  Administered 2015-07-08 – 2015-07-10 (×3): 100 mg via ORAL
  Filled 2015-07-07 (×3): qty 1

## 2015-07-07 MED ORDER — ALBUTEROL SULFATE (2.5 MG/3ML) 0.083% IN NEBU: 2.5000 mg | INHALATION_SOLUTION | RESPIRATORY_TRACT | Status: DC | PRN

## 2015-07-07 MED ORDER — LORAZEPAM 2 MG/ML IJ SOLN: 0.0000 mg | Freq: Four times a day (QID) | INTRAMUSCULAR | Status: AC

## 2015-07-07 MED ORDER — DEXTROSE 5 % IV SOLN
2.0000 g | INTRAVENOUS | Status: DC
  Filled 2015-07-07: qty 2

## 2015-07-07 MED ORDER — LORAZEPAM 1 MG PO TABS: 1.0000 mg | ORAL_TABLET | Freq: Four times a day (QID) | ORAL | Status: DC | PRN

## 2015-07-07 MED ORDER — PANTOPRAZOLE SODIUM 40 MG PO TBEC
40.0000 mg | DELAYED_RELEASE_TABLET | Freq: Every day | ORAL | Status: DC
  Administered 2015-07-08 – 2015-07-10 (×3): 40 mg via ORAL
  Filled 2015-07-07 (×3): qty 1

## 2015-07-07 MED ORDER — LEVOFLOXACIN IN D5W 750 MG/150ML IV SOLN
750.0000 mg | INTRAVENOUS | Status: DC
  Administered 2015-07-07: 750 mg via INTRAVENOUS
  Filled 2015-07-07: qty 150

## 2015-07-07 MED ORDER — SODIUM CHLORIDE 0.9 % IV SOLN
INTRAVENOUS | Status: DC
  Administered 2015-07-07: 06:00:00 via INTRAVENOUS

## 2015-07-07 MED ORDER — FOLIC ACID 1 MG PO TABS
1.0000 mg | ORAL_TABLET | Freq: Every day | ORAL | Status: DC
  Administered 2015-07-08 – 2015-07-09 (×2): 1 mg via ORAL
  Filled 2015-07-07 (×2): qty 1

## 2015-07-07 MED ORDER — SODIUM CHLORIDE 0.9 % IV SOLN: INTRAVENOUS | Status: DC

## 2015-07-07 MED ORDER — HYDRALAZINE HCL 50 MG PO TABS
50.0000 mg | ORAL_TABLET | Freq: Three times a day (TID) | ORAL | Status: DC
  Administered 2015-07-07 – 2015-07-10 (×8): 50 mg via ORAL
  Filled 2015-07-07: qty 2
  Filled 2015-07-07 (×8): qty 1

## 2015-07-07 MED ORDER — LORAZEPAM 2 MG/ML IJ SOLN: 0.0000 mg | Freq: Two times a day (BID) | INTRAMUSCULAR | Status: DC

## 2015-07-07 MED ORDER — SODIUM CHLORIDE 0.9 % IV SOLN
INTRAVENOUS | Status: DC
  Administered 2015-07-07: 75 mL/h via INTRAVENOUS
  Administered 2015-07-07: via INTRAVENOUS

## 2015-07-07 MED ORDER — LACTULOSE 10 GM/15ML PO SOLN
10.0000 g | Freq: Three times a day (TID) | ORAL | Status: DC
  Administered 2015-07-07 – 2015-07-10 (×8): 10 g via ORAL
  Filled 2015-07-07 (×10): qty 15

## 2015-07-07 MED ORDER — THIAMINE HCL 100 MG/ML IJ SOLN: 100.0000 mg | Freq: Every day | INTRAMUSCULAR | Status: DC

## 2015-07-07 MED ORDER — HEPARIN SODIUM (PORCINE) 5000 UNIT/ML IJ SOLN
5000.0000 [IU] | Freq: Three times a day (TID) | INTRAMUSCULAR | Status: DC
  Administered 2015-07-07 – 2015-07-10 (×8): 5000 [IU] via SUBCUTANEOUS
  Filled 2015-07-07 (×9): qty 1

## 2015-07-07 NOTE — ED Notes (Signed)
Pt on 4L, [place]

## 2015-07-07 NOTE — Consult Note (Signed)
Name: John [name] MRN: [id number] DOB: 08-07-1955    ADMISSION DATE:  07/07/2015 CONSULTATION DATE:07/07/15  REFERRING MD : Ronnie Derby  CHIEF COMPLAINT:  sob  BRIEF PATIENT DESCRIPTION:John [name] is a 60 y.o. 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   SIGNIFICANT EVENTS None  STUDIES:  3/10 ECHO>> 3/10 toxicology > for coccaine  3/10  BC>>   HISTORY OF PRESENT ILLNESS: John [name] is a 60 y.o. 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  Patient reports that he has been having shortness of breath for several days, which has been progressively getting worse. He has cough with greenish colored sputum production. No fever or chills. Patient does not have chest pain. He does not have runny nose or sore throat. He has nausea, but no vomiting, diarrhea, abdominal pain, symptoms of UTI. No unilateral weakness.  In ED, patient was found to have WBC 5.4, temperature normal, no tachycardia, transient tachypnea, sodium 120,BUN -37, Cr- 2.07,functiontroponin slightly elevated at 0.04 with mild St depression., BNP 65.5, AST-82, Platelets--120, procalcitonin 0.11. CXR showed shallow inspiration with atelectasis in the lung bases. Cardiac enlargement without vascular congestion or edema.   PAST MEDICAL HISTORY :   has a past medical history of Hypertension; Chronic back pain; Stroke ([place]) (1998); Polysubstance abuse; Optic neuropathy, left; CHF (congestive heart failure) ([place]); Ascites; ETOH abuse; and Cirrhosis ([place]).  has past surgical history that includes  none; Esophagogastroduodenoscopy (N/A, 08/14/2014); and Radiology with anesthesia (N/A, 08/15/2014). Prior to Admission medications   Medication Sig Start Date End Date Taking? Authorizing Provider  amLODipine (NORVASC) 10 MG tablet Take 1 tablet (10 mg total) by mouth daily. 05/25/15  Yes Debbe Odea, MD  furosemide (LASIX) 40 MG tablet Take 1 tablet (40 mg total) by mouth 2 (two) times daily. 05/25/15  Yes Debbe Odea, MD  hydrALAZINE (APRESOLINE) 50 MG tablet Take 1 tablet (50 mg total) by mouth 3 (three) times daily. 05/25/15  Yes Debbe Odea, MD  spironolactone (ALDACTONE) 25 MG tablet Take 25 mg by mouth 2 (two) times daily. 06/20/15  Yes Historical Provider, MD  levofloxacin (LEVAQUIN) 750 MG tablet Take 1 tablet (750 mg total) by mouth daily. Patient not taking: Reported on 07/07/2015 05/25/15   Debbe Odea, MD  spironolactone (ALDACTONE) 100 MG tablet Take 1 tablet (100 mg total) by mouth daily. Patient not taking: Reported on 07/07/2015 05/25/15   Debbe Odea, MD   Allergies  Allergen Reactions  . Penicillins     Convulsions, Patient could not walk  Has patient had a PCN reaction causing immediate rash, facial/tongue/throat swelling, SOB or lightheadedness with hypotension: No Has patient had a PCN reaction causing severe rash involving mucus membranes or skin necrosis: No Has patient had a PCN reaction that required hospitalization No Has patient had a PCN reaction occurring within the last 10 years: No If all of the above answers are "NO", then may proceed with Cephalosporin use.    FAMILY HISTORY:  family history includes Diabetes in his brother and mother; Heart disease in his mother; Hypertension in his brother, father, mother, and sister; Kidney disease in his brother and mother; Ulcers in his father. SOCIAL HISTORY:  reports that he has  quit smoking. His smoking use included Cigarettes. He has a 6 pack-year smoking history. He has never used smokeless tobacco. He reports that  he does not drink alcohol or use illicit drugs.  REVIEW OF SYSTEMS:   Constitutional: Negative for fever, chills, weight loss, malaise/fatigue and diaphoresis.  HENT: Negative for hearing loss, ear pain, nosebleeds, congestion, sore throat, neck pain, tinnitus and ear discharge.   Eyes: Negative for blurred vision, double vision, photophobia, pain, discharge and redness.  Respiratory: Negative for cough, hemoptysis, sputum production, shortness of breath, wheezing and stridor.   Cardiovascular: Negative for chest pain, palpitations, orthopnea, claudication, leg swelling and PND.  Gastrointestinal: Negative for heartburn, nausea, vomiting, abdominal pain, diarrhea, constipation, blood in stool and melena.  Genitourinary: Negative for dysuria, urgency, frequency, hematuria and flank pain.  Musculoskeletal: Negative for myalgias, back pain, joint pain and falls.  Skin: Negative for itching and rash.  Neurological: Negative for dizziness, tingling, tremors, sensory change, speech change, focal weakness, seizures, loss of consciousness, weakness and headaches.  Endo/Heme/Allergies: Negative for environmental allergies and polydipsia. Does not bruise/bleed easily.  SUBJECTIVE: "Difficulty in breahing"  VITAL SIGNS: Temp:  [97.6 F (36.4 C)-97.7 F (36.5 C)] 97.6 F (36.4 C) (03/10 1014) Pulse Rate:  [65-79] 65 (03/10 1030) Resp:  [15-27] 20 (03/10 1014) BP: (111-141)/(65-85) 123/79 mmHg (03/10 1030) SpO2:  [88 %-99 %] 94 % (03/10 1030) Weight:  [180 lb (81.647 kg)] 180 lb (81.647 kg) (03/10 0356)  PHYSICAL EXAMINATION: General:  sickly , ill appearing male Neuro:  Awake, alert , oriented , HEENT: atraumatic, normocephalic,no discharge Cardiovascular:  S1S2,no MRG, rrr Lungs:  Expiratory wheezes throughout Abdomen: soft BS positive Musculoskeletal: normal tone Skin:  intact   Recent Labs Lab 07/07/15 0410  NA 120*  K 4.8  CL 86*  CO2 20*  BUN 37*  CREATININE 2.07*  GLUCOSE  102*    Recent Labs Lab 07/07/15 0410  HGB 13.4  HCT 36.8*  WBC 5.4  PLT 120*   Dg Chest 2 View  07/07/2015  CLINICAL DATA:  Shortness of breath, very weak for 2 days EXAM: CHEST  2 VIEW COMPARISON:  07/07/2015 FINDINGS: Mild cardiac enlargement is stable. The vascular pattern is normal. Lungs are clear. No pleural effusions. IMPRESSION: No active cardiopulmonary disease. Electronically Signed   By: Skipper Cliche M.D.   On: 07/07/2015 11:18   Dg Chest 2 View  07/07/2015  CLINICAL DATA:  Cough and shortness of breath for 3 days EXAM: CHEST  2 VIEW COMPARISON:  05/22/2015 FINDINGS: Cardiac enlargement. Pulmonary vascularity appears normal. Shallow inspiration with atelectasis in the lung bases. No focal airspace disease or consolidation in the lungs. No blunting of costophrenic angles. No pneumothorax. Calcification in the left upper quadrant. IMPRESSION: Shallow inspiration with atelectasis in the lung bases. Cardiac enlargement without vascular congestion or edema. Electronically Signed   By: Lucienne Capers M.D.   On: 07/07/2015 04:52    ASSESSMENT / PLAN:  A  Acute Hypoxic respiratory failure with unclear etiology ? Hepatic lung syndrome    ?Ascitis  P V/Q scan to r/o PE CT angiogram condraindicated due to renal failure. ECHO with bubble study . Continue duoneb Monitor ABG Keep O2 sats>90% cxr in am   A H/O Alcohol cirrhosis P Continue CIWA protocol Trend AST,ALT Continue lactulose. Continue lorazepam for withdrawal Thiamine/folic acid  A Acute renal failure with severe hyponatremia P Continue to trend sodium I/V fluids as per primary  A Hypertension P  Continue Amlodipine/hydralazine  Lashawnta Burgert,AG-ACNP Pulmonary &  Critical Care Pulmonary and [place] Pager: [phone]  07/07/2015, 2:14 PM

## 2015-07-07 NOTE — Progress Notes (Signed)
Took report from ED on pt coming

## 2015-07-07 NOTE — Progress Notes (Addendum)
Patient Demographics:    John [name], is a 60 y.o. male, DOB - 1955-10-13, [id number]  Admit date - 07/07/2015   Admitting Physician Ivor Costa, MD  Outpatient Primary MD for the patient is Elizabeth Palau, MD  LOS - 0   Chief Complaint  Patient presents with  . Shortness of Breath        Subjective:    John [name] today has, No headache, No chest pain, No abdominal pain - No Nausea, No new weakness tingling or numbness, No Cough - SOB.    Assessment  & Plan :     1. Hypoxic respiratory failure. Etiology not entirely clear, chest x-ray 2 was unremarkable, BNP is normal, pulmonary exam is underwhelming as well. Could not get CT angiogram due to renal failure, will get VQ scan to rule out PE, he is already on 5 L nasal cannula oxygen with low Pao2 on ABG, and appears to be in respiratory distress. We'll place him on BiPAP. Pulmonary has been consulted. Continue supportive care with nebulizer treatments. Stable pro-calcitonin and lactic acid levels. We'll hold any antibiotics for now. He has ruled out for influenza, stopTamiflu.   2. 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 Ammonia level is borderline, resume lactulose, he is not sure his compliance with diuretics what pills he is actually taking, for now he has no peripheral edema and only minimal ascites. Will request radiology to do ultrasound-guided paracentesis, clinically chances of SBP are low. For now we'll cover with Rocephin. Place on PPI.   3. Acute renal failure with severe hyponatremia. Clinically appears dehydrated. Urine sodium less than 20, will hydrate with normal saline and monitor.   4. History of alcohol abuse, ongoing cocaine abuse.  Counseled to quit all. Says he's not been drinking alcohol lately. Monitor on CIWA protocol. Avoid beta blocker.   5. Essential hypertension. Continue Norvasc.   6. Chr. diastolic CHF last EF [id number]. Appears dehydrated, we'll check baseline echogram.    Code Status : Full  Family Communication  : None present  Disposition Plan  : Stepdown  Consults  : PCCM  Procedures  :   Chest x-ray unremarkable.  VQ scan ordered  Sound guided paracentesis ordered  DVT Prophylaxis  :   Heparin    Lab Results  Component Value Date   PLT 120* 07/07/2015    Inpatient Medications  Scheduled Meds: . amLODipine  10 mg Oral Daily  . heparin  5,000 Units Subcutaneous 3 times per day  . hydrALAZINE  50 mg Oral TID  . lactulose  10 g Oral TID  . oseltamivir  30 mg Oral BID   Continuous Infusions: . sodium chloride    . cefTRIAXone (ROCEPHIN)  IV    . levofloxacin (LEVAQUIN) IV Stopped (07/07/15 1005)   PRN Meds:.albuterol, nitroGLYCERIN  Antibiotics  :     Anti-infectives    Start     Dose/Rate Route Frequency Ordered Stop   07/07/15 1315  cefTRIAXone (ROCEPHIN) 1 g in dextrose 5 % 50 mL IVPB     1 g 100 mL/hr over 30 Minutes Intravenous Every 24 hours 07/07/15 1304     07/07/15 1000  oseltamivir (TAMIFLU) capsule  30 mg     30 mg Oral 2 times daily 07/07/15 0948 07/12/15 0959   07/07/15 0645  levofloxacin (LEVAQUIN) IVPB 750 mg     750 mg 100 mL/hr over 90 Minutes Intravenous Every 24 hours 07/07/15 0632 07/12/15 0644        Objective:   Filed Vitals:   07/07/15 0715 07/07/15 0936 07/07/15 1014 07/07/15 1030  BP: 121/67 111/73 121/77 123/79  Pulse: 71 68 67 65  Temp:   97.6 F (36.4 C)   TempSrc:   Oral   Resp: 21 16 20    Height:      Weight:      SpO2: 90% 99% 97% 94%    Wt Readings from Last 3 Encounters:  07/07/15 81.647 kg (180 lb)  05/25/15 82.827 kg (182 lb 9.6 oz)  03/14/15 86.002 kg (189 lb 9.6 oz)     Intake/Output Summary (Last 24 hours) at 07/07/15  1305 Last data filed at 07/07/15 1106  Gross per 24 hour  Intake    125 ml  Output   2900 ml  Net  -2775 ml     Physical Exam  Awake Alert, Oriented X 3, No new F.N deficits, Normal affect [place].AT,PERRAL Supple Neck,No JVD, No cervical lymphadenopathy appriciated.  Symmetrical Chest wall movement, Good air movement bilaterally, few rales RRR,No Gallops,Rubs or new Murmurs, No Parasternal Heave +ve B.Sounds, Abd Soft with some ascites, No tenderness, No organomegaly appriciated, No rebound - guarding or rigidity. No Cyanosis, Clubbing or edema, No new Rash or bruise       Data Review:   Micro Results No results found for this or any previous visit (from the past 240 hour(s)).  Radiology Reports Dg Chest 2 View  07/07/2015  CLINICAL DATA:  Shortness of breath, very weak for 2 days EXAM: CHEST  2 VIEW COMPARISON:  07/07/2015 FINDINGS: Mild cardiac enlargement is stable. The vascular pattern is normal. Lungs are clear. No pleural effusions. IMPRESSION: No active cardiopulmonary disease. Electronically Signed   By: Skipper Cliche M.D.   On: 07/07/2015 11:18   Dg Chest 2 View  07/07/2015  CLINICAL DATA:  Cough and shortness of breath for 3 days EXAM: CHEST  2 VIEW COMPARISON:  05/22/2015 FINDINGS: Cardiac enlargement. Pulmonary vascularity appears normal. Shallow inspiration with atelectasis in the lung bases. No focal airspace disease or consolidation in the lungs. No blunting of costophrenic angles. No pneumothorax. Calcification in the left upper quadrant. IMPRESSION: Shallow inspiration with atelectasis in the lung bases. Cardiac enlargement without vascular congestion or edema. Electronically Signed   By: Lucienne Capers M.D.   On: 07/07/2015 04:52     CBC  Recent Labs Lab 07/07/15 0410  WBC 5.4  HGB 13.4  HCT 36.8*  PLT 120*  MCV 78.8  MCH 28.7  MCHC 36.4*  RDW 13.7  LYMPHSABS 1.7  MONOABS 1.2*  EOSABS 0.0  BASOSABS 0.1    Chemistries   Recent Labs Lab  07/07/15 0410  NA 120*  K 4.8  CL 86*  CO2 20*  GLUCOSE 102*  BUN 37*  CREATININE 2.07*  CALCIUM 9.1  AST 82*  ALT 50  ALKPHOS 112  BILITOT 1.3*   ------------------------------------------------------------------------------------------------------------------ No results for input(s): CHOL, HDL, LDLCALC, TRIG, CHOLHDL, LDLDIRECT in the last 72 hours.  Lab Results  Component Value Date   HGBA1C 5.2 06/24/2014   ------------------------------------------------------------------------------------------------------------------ No results for input(s): TSH, T4TOTAL, T3FREE, THYROIDAB in the last 72 hours.  Invalid input(s): FREET3 ------------------------------------------------------------------------------------------------------------------ No results for input(s):  VITAMINB12, FOLATE, FERRITIN, TIBC, IRON, RETICCTPCT in the last 72 hours.  Coagulation profile  Recent Labs Lab 07/07/15 0619  INR 1.19    No results for input(s): DDIMER in the last 72 hours.  Cardiac Enzymes  Recent Labs Lab 07/07/15 0410 07/07/15 0731  TROPONINI 0.04* 0.03   ------------------------------------------------------------------------------------------------------------------    Component Value Date/Time   BNP 65.5 07/07/2015 0410    Time Spent in minutes   35   Kamoni Gentles K M.D on 07/07/2015 at 1:05 PM  Between 7am to 7pm - Pager - [phone]  After 7pm go to [email or web address] - password [facility]  [facility] -  Office  [phone]

## 2015-07-07 NOTE — ED Provider Notes (Signed)
CSN: [id number]     Arrival date & time 07/07/15  [id number] History   By signing my name below, I, John [name], attest that this documentation has been prepared under the direction and in the presence of Veryl Speak, MD. Electronically Signed: Randa Evens, ED Scribe. 07/07/2015. 3:58 AM.     Chief Complaint  Patient presents with  . Shortness of Breath    Patient is a 60 y.o. male presenting with shortness of breath. The history is provided by the patient. No language interpreter was used.  Shortness of Breath Associated symptoms: cough   Associated symptoms: no chest pain and no fever    HPI Comments: John [name] is a 60 y.o. male brought in by ambulance, who presents to the Emergency Department complaining of worsening SOB onset 2 days prior. Pt reports associated productive cough. Pt doesn't report any medications PTA. States that he is not on at home oxygen. Pt states that he was recently diagnosed with pneumonia. Denies CP, fever, leg swelling.   Past Medical History  Diagnosis Date  . Hypertension   . Chronic back pain   . Stroke ([place]) 1998  . Polysubstance abuse   . Optic neuropathy, left   . CHF (congestive heart failure) ([place])   . Ascites   . ETOH abuse   . Cirrhosis [facility])    Past Surgical History  Procedure Laterality Date  . None    . Esophagogastroduodenoscopy N/A 08/14/2014    Procedure: ESOPHAGOGASTRODUODENOSCOPY (EGD);  Surgeon: Ladene Artist, MD;  Location: [facility];  Service: Endoscopy;  Laterality: N/A;  . Radiology with anesthesia N/A 08/15/2014    Procedure: RADIOLOGY WITH ANESTHESIA;  Surgeon: Greggory Keen, MD;  Location: [facility];  Service: Radiology;  Laterality: N/A;   Family History  Problem Relation Age of Onset  . Hypertension Mother     Living  . Kidney disease Mother   . Diabetes Mother   . Heart disease Mother   . Hypertension Brother   . Diabetes Brother   . Kidney disease Brother   . Hypertension Father     Deceased, 7  .  Ulcers Father   . Hypertension Sister    Social History  Substance Use Topics  . Smoking status: Former Smoker -- 0.30 packs/day for 20 years    Types: Cigarettes  . Smokeless tobacco: Never Used     Comment: 2015  . Alcohol Use: No     Comment: occ    Review of Systems  Constitutional: Negative for fever.  Respiratory: Positive for cough and shortness of breath.   Cardiovascular: Negative for chest pain and leg swelling.  All other systems reviewed and are negative.    Allergies  Penicillins  Home Medications   Prior to Admission medications   Medication Sig Start Date End Date Taking? Authorizing Provider  amLODipine (NORVASC) 10 MG tablet Take 1 tablet (10 mg total) by mouth daily. 05/25/15   Debbe Odea, MD  furosemide (LASIX) 40 MG tablet Take 1 tablet (40 mg total) by mouth 2 (two) times daily. 05/25/15   Debbe Odea, MD  hydrALAZINE (APRESOLINE) 50 MG tablet Take 1 tablet (50 mg total) by mouth 3 (three) times daily. 05/25/15   Debbe Odea, MD  levofloxacin (LEVAQUIN) 750 MG tablet Take 1 tablet (750 mg total) by mouth daily. 05/25/15   Debbe Odea, MD  spironolactone (ALDACTONE) 100 MG tablet Take 1 tablet (100 mg total) by mouth daily. 05/25/15   Debbe Odea, MD   Ht 5\' 11"  (  1.803 m)  Wt 180 lb (81.647 kg)  BMI 25.12 kg/m2   Physical Exam  Constitutional: He is oriented to person, place, and time. He appears well-developed and well-nourished. No distress.  HENT:  Head: Normocephalic and atraumatic.  Mouth/Throat: Oropharynx is clear and moist. No oropharyngeal exudate.  Eyes: Conjunctivae and EOM are normal. Pupils are equal, round, and reactive to light.  Neck: Normal range of motion. Neck supple.  No meningismus.  Cardiovascular: Normal rate, regular rhythm, normal heart sounds and intact distal pulses.   No murmur heard. Pulmonary/Chest: Effort normal. No respiratory distress. He has rales.  Rales in bases bilaterally.   Abdominal: Soft. There is no  tenderness.  Musculoskeletal: Normal range of motion. He exhibits no edema or tenderness.  Neurological: He is alert and oriented to person, place, and time. No cranial nerve deficit. He exhibits normal muscle tone. Coordination normal.  No ataxia on finger to nose bilaterally. No pronator drift. 5/5 strength throughout. CN 2-12 intact.Equal grip strength. Sensation intact.   Skin: Skin is warm.  Psychiatric: He has a normal mood and affect. His behavior is normal.  Nursing note and vitals reviewed.   ED Course  Procedures (including critical care time)  COORDINATION OF CARE: 3:59 AM-Discussed treatment plan with pt at bedside and pt agreed to plan.     Labs Review Labs Reviewed  COMPREHENSIVE METABOLIC PANEL  CBC WITH DIFFERENTIAL/PLATELET  BRAIN NATRIURETIC PEPTIDE  TROPONIN I    Imaging Review No results found. I have personally reviewed and evaluated these images and lab results as part of my medical decision-making.   EKG Interpretation   Date/Time:  Friday July 07 2015 03:57:12 EST Ventricular Rate:  76 PR Interval:  125 QRS Duration: 98 QT Interval:  423 QTC Calculation: 476 R Axis:   48 Text Interpretation:  Sinus rhythm Nonspecific repol abnormality, lateral  leads Confirmed by Darci Lykins  MD, Syble Picco ([id number]) on 07/07/2015 4:07:42 AM      MDM   Final diagnoses:  None      Patient is a 60 year old male with history of cirrhosis and hypertension. He presents for evaluation of shortness of breath and weakness. His workup today reveals a sodium of 120 which I suspect is the cause of his symptoms. The remainder the workup is unremarkable. There is no evidence for CHF on the chest x-ray and BNP is within normal limits. I've spoken with Dr. Blaine Hamper who agrees to admit.   I personally performed the services described in this documentation, which was scribed in my presence. The recorded information has been reviewed and is accurate.        Veryl Speak, MD 07/07/15  [phone]

## 2015-07-07 NOTE — ED Notes (Signed)
Report given to RN on [address].  

## 2015-07-07 NOTE — ED Notes (Signed)
Bladder scan -- 382cc ==

## 2015-07-07 NOTE — ED Notes (Signed)
Called respiratory, will help transfer shortly

## 2015-07-07 NOTE — ED Notes (Signed)
In and out cathed for 450 cc.

## 2015-07-07 NOTE — Progress Notes (Signed)
Patient ID: John [name], male   DOB: 1955-06-04, 60 y.o.   MRN: [id number] Pt seen in [place] for US-guided paracentesis.  After evaluation with [place] it was determined that the patient has no abdominal ascites.  The procedure was not performed and the patient was sent back to his room.  John [name] E 3:55 PM 07/07/2015

## 2015-07-07 NOTE — ED Notes (Signed)
Pt to nuclear medicine for scan

## 2015-07-07 NOTE — ED Notes (Signed)
Dr Delo in room 

## 2015-07-07 NOTE — Progress Notes (Signed)
Trying patient off BIPAP, placed on 4 L [place]. Pt says he feels ok. Sats 93%. Transporting to [place] on [address] with BIPAP as backup.

## 2015-07-07 NOTE — H&P (Addendum)
[facility] History and Physical  John [name] [id number] DOB: Apr 29, 1956 DOA: 07/07/2015  Referring physician: ED physician PCP: Elizabeth Palau, MD  Specialists:   Chief Complaint: Productive cough and shortness of breath  HPI: John [name] is a 60 y.o. 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  Patient reports that he has been having shortness of breath for several days, which has been progressively getting worse. He has cough with greenish colored sputum production. No fever or chills. Patient does not have chest pain. He does not have runny nose or sore throat. He has nausea, but no vomiting, diarrhea, abdominal pain, symptoms of UTI. No unilateral weakness.  In ED, patient was found to have WBC 5.4, temperature normal, no tachycardia, transient tachypnea, sodium 120, renal function at baseline, troponin slightly elevated at 0.04, BNP 65.5. CXR showed shallow inspiration with atelectasis in the lung bases. Cardiac enlargement without vascular congestion or edema. Patient is admitted to inpatient for further reevaluation and treatment.   EKG: Independently reviewed. QTC 476, mild ST depression in lateral leads and V4-V6, which is similar to previous EKG on 05/22/15.  Where does patient live?   At home  Can patient participate in ADLs?  Some   Review of Systems:   General: no fevers, chills, no changes in body weight, has poor appetite, has fatigue HEENT: no blurry vision, hearing changes or sore throat Pulm: has dyspnea, coughing, no wheezing CV: no chest pain, no palpitations Abd: no nausea, vomiting, abdominal pain, diarrhea, constipation GU: no dysuria, burning on urination, increased urinary frequency, hematuria  Ext: no leg edema Neuro: no  unilateral weakness, numbness, or tingling, no vision change or hearing loss Skin: no rash MSK: No muscle spasm, no deformity, no limitation of range of movement in spin Heme: No easy bruising.  Travel history: No recent long distant travel.  Allergy:  Allergies  Allergen Reactions  . Penicillins     Convulsions, Patient could not walk  Has patient had a PCN reaction causing immediate rash, facial/tongue/throat swelling, SOB or lightheadedness with hypotension: No Has patient had a PCN reaction causing severe rash involving mucus membranes or skin necrosis: No Has patient had a PCN reaction that required hospitalization No Has patient had a PCN reaction occurring within the last 10 years: No If all of the above answers are "NO", then may proceed with Cephalosporin use.    Past Medical History  Diagnosis Date  . Hypertension   . Chronic back pain   . Stroke ([place]) 1998  . Polysubstance abuse   . Optic neuropathy, left   . CHF (congestive heart failure) ([place])   . Ascites   . ETOH abuse   . Cirrhosis [facility])     Past Surgical History  Procedure Laterality Date  . None    . Esophagogastroduodenoscopy N/A 08/14/2014    Procedure: ESOPHAGOGASTRODUODENOSCOPY (EGD);  Surgeon: Ladene Artist, MD;  Location: [facility];  Service: Endoscopy;  Laterality: N/A;  . Radiology with anesthesia N/A 08/15/2014    Procedure: RADIOLOGY WITH ANESTHESIA;  Surgeon: Greggory Keen, MD;  Location: [facility];  Service: Radiology;  Laterality: N/A;    Social History:  reports that he has quit smoking. His smoking use included Cigarettes. He has a 6 pack-year smoking history. He has never used smokeless tobacco. He reports that he does not drink alcohol or  use illicit drugs.  Family History:  Family History  Problem Relation Age of Onset  . Hypertension Mother     Living  . Kidney disease Mother   . Diabetes Mother   . Heart disease Mother   . Hypertension Brother   . Diabetes Brother   . Kidney  disease Brother   . Hypertension Father     Deceased, 54  . Ulcers Father   . Hypertension Sister      Prior to Admission medications   Medication Sig Start Date End Date Taking? Authorizing Provider  amLODipine (NORVASC) 10 MG tablet Take 1 tablet (10 mg total) by mouth daily. 05/25/15   Debbe Odea, MD  furosemide (LASIX) 40 MG tablet Take 1 tablet (40 mg total) by mouth 2 (two) times daily. 05/25/15   Debbe Odea, MD  hydrALAZINE (APRESOLINE) 50 MG tablet Take 1 tablet (50 mg total) by mouth 3 (three) times daily. 05/25/15   Debbe Odea, MD  levofloxacin (LEVAQUIN) 750 MG tablet Take 1 tablet (750 mg total) by mouth daily. 05/25/15   Debbe Odea, MD  spironolactone (ALDACTONE) 100 MG tablet Take 1 tablet (100 mg total) by mouth daily. 05/25/15   Debbe Odea, MD    Physical Exam: Filed Vitals:   07/07/15 0400 07/07/15 0415 07/07/15 0530 07/07/15 0600  BP: 141/78 134/85 141/84 128/72  Pulse: 79 71 72 68  Temp: 97.7 F (36.5 C)     TempSrc: Oral     Resp: 25 27 15 21   Height:      Weight:      SpO2: 94% 98% 95% 97%   General: Not in acute distress. Dry mucus membrane HEENT:       Eyes: PERRL, EOMI, no scleral icterus.       ENT: No discharge from the ears and nose, no pharynx injection, no tonsillar enlargement.        Neck: No JVD, no bruit, no mass felt. Heme: No neck lymph node enlargement. Cardiac: S1/S2, RRR, No murmurs, No gallops or rubs. Pulm: has diffuse rhonchi and rales bilaterally. Abd: Soft, nondistended, nontender, no rebound pain, no organomegaly, BS present. Ext: No pitting leg edema bilaterally. 2+DP/PT pulse bilaterally. Musculoskeletal: No joint deformities, No joint redness or warmth, no limitation of ROM in spin. Skin: No rashes.  Neuro: Alert, oriented X3, cranial nerves II-XII grossly intact, moves all extremities normally. Psych: Patient is not psychotic, no suicidal or hemocidal ideation.  Labs on Admission:  Basic Metabolic Panel:  Recent  Labs Lab 07/07/15 0410  NA 120*  K 4.8  CL 86*  CO2 20*  GLUCOSE 102*  BUN 37*  CREATININE 2.07*  CALCIUM 9.1   Liver Function Tests:  Recent Labs Lab 07/07/15 0410  AST 82*  ALT 50  ALKPHOS 112  BILITOT 1.3*  PROT 8.1  ALBUMIN 3.4*   No results for input(s): LIPASE, AMYLASE in the last 168 hours. No results for input(s): AMMONIA in the last 168 hours. CBC:  Recent Labs Lab 07/07/15 0410  WBC 5.4  NEUTROABS 2.4  HGB 13.4  HCT 36.8*  MCV 78.8  PLT 120*   Cardiac Enzymes:  Recent Labs Lab 07/07/15 0410  TROPONINI 0.04*    BNP (last 3 results)  Recent Labs  10/31/14 1856 07/07/15 0410  BNP 398.9* 65.5    ProBNP (last 3 results) No results for input(s): PROBNP in the last 8760 hours.  CBG: No results for input(s): GLUCAP in the last 168 hours.  Radiological Exams on Admission:  Dg Chest 2 View  07/07/2015  CLINICAL DATA:  Cough and shortness of breath for 3 days EXAM: CHEST  2 VIEW COMPARISON:  05/22/2015 FINDINGS: Cardiac enlargement. Pulmonary vascularity appears normal. Shallow inspiration with atelectasis in the lung bases. No focal airspace disease or consolidation in the lungs. No blunting of costophrenic angles. No pneumothorax. Calcification in the left upper quadrant. IMPRESSION: Shallow inspiration with atelectasis in the lung bases. Cardiac enlargement without vascular congestion or edema. Electronically Signed   By: Lucienne Capers M.D.   On: 07/07/2015 04:52    Assessment/Plan Principal Problem:   Acute respiratory failure (HCC) Active Problems:   Polysubstance abuse   SOB (shortness of breath)   Thrombocytopenia (HCC)   Elevated troponin   Ascites   Cirrhosis (HCC)   Upper GI bleed   Gastric varices   Hepatitis C   Chronic kidney disease, stage 3   Hyponatremia   Dehydration   Acute respiratory failure: Etiology is not clear. Chest x-ray has no infiltration. Patient has shortness of breath and productive cough, indicating  possible bronchitis versus early stage of pneumonia. Another possibility is substance-related pneumonitis given he has history of polysubstance abuse. Patient is not septic on admission. Lactate level is pending. Hemodynamically stable.  - Will admit to Telemetry Bed - start [place] - Mucinex for cough  - Albuterol Neb prn for SOB - Urine legionella and S. pneumococcal antigen - Follow up blood culture x2, sputum culture and respiratory virus panel, plus Flu pcr - will get Procalcitonin and trend lactic acid level  Hyponatremia: Sodium 120. Likely due to combination of decreased oral intake and diuretics use. Mental status okay. -Hold diuretics (Lasix and spironolactone) -IV normal saline at 125 mL per hour -Urine osmolality and serum osmolality.  CKD-III: Baseline creatinine 1.5-2.0. His creatinine is 2.07, BUN 37, which is close to baseline. -Follow-up renal function by BMP  History of polysubstance abuse: -Did counseling about the importance of quitting substance use -check UDS  Mildly elevated troponin: Troponin 0.04. Patient does not have any chest pain. It is likely due to CKD-III vs demanding ischemia. 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 Patient has high risk of GI bleeding -trop x 3 -check A1c and FLP -prn NTG  Ascites due to alcoholic cirrhosis: per previous discharge summary, he was on lactulose and Xifaxin before, which he states he stopped taking them. His mental status is okay, no signs of hepatic encephalopathy. -Check ammonia level  Dehydration: -On IVF as above   DVT ppx: (Pt has CKD-III, sq heparin is better choice than sq Lovenox)   Code Status: Full code Family Communication: None at bed side.  Disposition Plan: Admit to inpatient   Date of Service 07/07/2015    Ivor Costa [facility] Pager [phone]  If 7PM-7AM, please contact night-coverage [email or web address] Password [email or web address] 07/07/2015, 6:41 AM

## 2015-07-07 NOTE — ED Notes (Signed)
Pt is short of breath with any movement. Dr. Candiss Norse in to examine.

## 2015-07-07 NOTE — ED Notes (Signed)
Patient with increased shortness of breath, has had a recent diagnosis of PNA.  Patient was found to have decreased O2 saturation by EMS, 89% per EMS. Patient denies any CP.

## 2015-07-07 NOTE — ED Notes (Signed)
Report given to 2C RN 

## 2015-07-08 ENCOUNTER — Inpatient Hospital Stay (HOSPITAL_COMMUNITY): Payer: Medicaid Other

## 2015-07-08 DIAGNOSIS — E871 Hypo-osmolality and hyponatremia: Secondary | ICD-10-CM

## 2015-07-08 DIAGNOSIS — I864 Gastric varices: Secondary | ICD-10-CM

## 2015-07-08 DIAGNOSIS — E860 Dehydration: Secondary | ICD-10-CM

## 2015-07-08 DIAGNOSIS — N183 Chronic kidney disease, stage 3 (moderate): Secondary | ICD-10-CM

## 2015-07-08 LAB — COMPREHENSIVE METABOLIC PANEL
ALBUMIN: 3.1 g/dL — AB (ref 3.5–5.0)
ALK PHOS: 76 U/L (ref 38–126)
ALT: 42 U/L (ref 17–63)
ANION GAP: 11 (ref 5–15)
AST: 65 U/L — AB (ref 15–41)
BILIRUBIN TOTAL: 0.9 mg/dL (ref 0.3–1.2)
BUN: 31 mg/dL — AB (ref 6–20)
CALCIUM: 9 mg/dL (ref 8.9–10.3)
CO2: 20 mmol/L — AB (ref 22–32)
CREATININE: 2.03 mg/dL — AB (ref 0.61–1.24)
Chloride: 104 mmol/L (ref 101–111)
GFR calc Af Amer: 40 mL/min — ABNORMAL LOW (ref >60)
GFR calc non Af Amer: 34 mL/min — ABNORMAL LOW (ref >60)
GLUCOSE: 105 mg/dL — AB (ref 65–99)
Potassium: 4.3 mmol/L (ref 3.5–5.1)
SODIUM: 135 mmol/L (ref 135–145)
TOTAL PROTEIN: 7.4 g/dL (ref 6.5–8.1)

## 2015-07-08 LAB — CBC
HEMATOCRIT: 36.5 % — AB (ref 39.0–52.0)
HEMOGLOBIN: 12.7 g/dL — AB (ref 13.0–17.0)
MCH: 27.6 pg (ref 26.0–34.0)
MCHC: 34.8 g/dL (ref 30.0–36.0)
MCV: 79.3 fL (ref 78.0–100.0)
Platelets: 129 10*3/uL — ABNORMAL LOW (ref 150–400)
RBC: 4.6 MIL/uL (ref 4.22–5.81)
RDW: 13.7 % (ref 11.5–15.5)
WBC: 5.5 10*3/uL (ref 4.0–10.5)

## 2015-07-08 LAB — LIPID PANEL
CHOLESTEROL: 125 mg/dL (ref 0–200)
HDL: 39 mg/dL — AB (ref >40)
LDL Cholesterol: 66 mg/dL (ref 0–99)
TRIGLYCERIDES: 100 mg/dL (ref <150)
Total CHOL/HDL Ratio: 3.2 RATIO
VLDL: 20 mg/dL (ref 0–40)

## 2015-07-08 LAB — URINE CULTURE: CULTURE: NO GROWTH

## 2015-07-08 LAB — HEMOGLOBIN A1C
Hgb A1c MFr Bld: 5.1 % (ref 4.8–5.6)
MEAN PLASMA GLUCOSE: 100 mg/dL

## 2015-07-08 LAB — MAGNESIUM: Magnesium: 2.4 mg/dL (ref 1.7–2.4)

## 2015-07-08 MED ORDER — ENSURE ENLIVE PO LIQD
237.0000 mL | Freq: Two times a day (BID) | ORAL | Status: DC
  Administered 2015-07-08 – 2015-07-10 (×4): 237 mL via ORAL

## 2015-07-08 NOTE — Evaluation (Signed)
Physical Therapy Evaluation Patient Details Name: John [name] MRN: [id number] DOB: October 24, 1955 Today's Date: 07/08/2015   History of Present Illness  John [name] is a 60 y.o. male with PMH of hypertension, stroke, diastolic congestive heart failure, polysubstance abuse, liver cirrhosis, GI bleeding, gastric versus, HCV, chronic back pain, chronic kidney disease-stage III, who presents with productive cough and shortness of breath  Clinical Impression  Patient presents with decreased independence with mobility due to deficits listed in PT problem list below.  He will benefit from skilled PT in the acute setting to allow return home with intermittent aide help and HHPT.    Follow Up Recommendations Home health PT    Equipment Recommendations  None recommended by PT    Recommendations for Other Services       Precautions / Restrictions Precautions Precautions: Fall      Mobility  Bed Mobility Overal bed mobility: Modified Independent                Transfers Overall transfer level: Needs assistance Equipment used: Rolling walker (2 wheeled) Transfers: Sit to/from Stand Sit to Stand: Supervision         General transfer comment: for safety with multiple lines and O2  Ambulation/Gait Ambulation/Gait assistance: Min guard Ambulation Distance (Feet): 120 Feet Assistive device: Rolling walker (2 wheeled) Gait Pattern/deviations: Step-through pattern;Decreased stride length;Shuffle     General Gait Details: LOB x 1 when turning around minguard for recovery otherwise with supervision with RW; indicates weakness as compared to yesterday  Stairs            Wheelchair Mobility    Modified Rankin (Stroke Patients Only)       Balance Overall balance assessment: Needs assistance Sitting-balance support: Feet supported Sitting balance-Leahy Scale: Good     Standing balance support: Bilateral upper extremity supported Standing balance-Leahy Scale:  Fair Standing balance comment: static balance okay without UE support, but needs walker for ambulation/dynamic activities                             Pertinent Vitals/Pain Pain Assessment: No/denies pain    Home Living Family/patient expects to be discharged to:: Private residence Living Arrangements: Alone Available Help at Discharge: Personal care attendant;Available PRN/intermittently Type of Home: Apartment Home Access: Elevator     Home Layout: One level Home Equipment: Cane - single point;Walker - 2 wheels      Prior Function Level of Independence: Independent         Comments: doesn't drive, [place] apartments     Hand Dominance        Extremity/Trunk Assessment               Lower Extremity Assessment: Generalized weakness         Communication   Communication: No difficulties  Cognition Arousal/Alertness: Awake/alert Behavior During Therapy: WFL for tasks assessed/performed Overall Cognitive Status: Within Functional Limits for tasks assessed                      General Comments General comments (skin integrity, edema, etc.): maintained on 4L O2 throughout and noted no drops in O2 sats below 90%    Exercises        Assessment/Plan    PT Assessment Patient needs continued PT services  PT Diagnosis Difficulty walking;Generalized weakness   PT Problem List Decreased strength;Decreased activity tolerance;Decreased balance;Decreased mobility;Decreased safety awareness  PT Treatment Interventions DME instruction;Balance training;Gait training;Functional mobility  training;Patient/family education;Therapeutic activities;Therapeutic exercise   PT Goals (Current goals can be found in the Care Plan section) Acute Rehab PT Goals Patient Stated Goal: To go home PT Goal Formulation: With patient Time For Goal Achievement: 07/15/15 Potential to Achieve Goals: Good    Frequency Min 3X/week   Barriers to discharge         Co-evaluation               End of Session Equipment Utilized During Treatment: Gait belt;Oxygen Activity Tolerance: Patient limited by fatigue Patient left: in bed;with call bell/phone within reach           Time: [id number] PT Time Calculation (min) (ACUTE ONLY): 26 min   Charges:   PT Evaluation $PT Eval Moderate Complexity: 1 Procedure PT Treatments $Gait Training: 8-22 mins   PT G CodesReginia Naas 2015/07/11, 3:06 PM  Magda Kiel, [place] July 11, 2015

## 2015-07-08 NOTE — Progress Notes (Signed)
Pt arrived to [facility]. Pt introduced to unit and policies and procedures. Pt given CHG bath, MRSA PCR swab done and pt hooked to electrodes. Will continue to monitor patient throughout shift.

## 2015-07-08 NOTE — Progress Notes (Signed)
Initial Nutrition Assessment  DOCUMENTATION CODES:   Severe malnutrition in context of chronic illness  INTERVENTION:    Ensure Enlive PO BID, each supplement provides 350 kcal and 20 grams of protein  NUTRITION DIAGNOSIS:   Malnutrition related to chronic illness as evidenced by severe depletion of muscle mass, percent weight loss (12% within 6 months).  GOAL:   Patient will meet greater than or equal to 90% of their needs  MONITOR:   PO intake, Supplement acceptance, Weight trends, Labs  REASON FOR ASSESSMENT:   Malnutrition Screening Tool    ASSESSMENT:   60 y.o. 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  Patient says he thinks he has been eating okay at home. He eats 2 meals per day, does not eat lunch. He has lost a lot of weight. Noted hx of polysubstance abuse. Suspect intake PTA was poor. He has lost 12% of his usual weight over the past 6 months. Nutrition-Focused physical exam completed. Findings are no fat depletion, mild-moderate and severe muscle depletion, and mild edema.   Diet Order:  Diet Heart Room service appropriate?: Yes; Fluid consistency:: Thin  Skin:  Reviewed, no issues  Last BM:  unknown  Height:   Ht Readings from Last 1 Encounters:  07/07/15 5\' 11"  (1.803 m)    Weight:   Wt Readings from Last 1 Encounters:  07/07/15 180 lb (81.647 kg)    Ideal Body Weight:  78.2 kg  BMI:  Body mass index is 25.12 kg/(m^2).  Estimated Nutritional Needs:   Kcal:  2200-2400  Protein:  115-130 gm  Fluid:  2.2-2.4 L  EDUCATION NEEDS:   No education needs identified at this time  Molli Barrows, [place], [place], [place] Pager [phone] After Hours Pager [phone]

## 2015-07-08 NOTE — Plan of Care (Signed)
Problem: Safety: Goal: Ability to remain free from injury will improve Outcome: Completed/Met Date Met:  07/08/15 Pt is aware of [facility] policy and procedures regarding falls. Call bell within reach. Pt aware to call for help when getting out of bed. Clear pathways made in room. Pt is able to voice concerns and questions as needed.

## 2015-07-08 NOTE — Progress Notes (Signed)
Patient Demographics:    John [name], is a 60 y.o. male, DOB - 1955/12/04, [id number]  Admit date - 07/07/2015   Admitting Physician John Costa, MD  Outpatient Primary MD for the patient is John Palau, MD  LOS - 1   Chief Complaint  Patient presents with  . Shortness of Breath        Subjective:    Name John [name] today has, No headache, No chest pain, No abdominal pain - No Nausea, No new weakness tingling or numbness, No Cough - SOB.    Assessment  & Plan :     1. Hypoxic respiratory failure. Etiology not entirely clear, chest x-ray 2 was unremarkable, BNP is normal, pulmonary exam is underwhelming as well. Could not get CT angiogram due to renal failure, had a unremarkable VQ scan, shortness of breath is much improved this morning and he is down to 2 L nasal cannula, his pro-calcitonin and lactate levels were normal as well with a negative influenza screen. Pulmonary was consulted for severe hypoxia of unclear etiology, bubble echo is pending, question shunt versus cocaine induced lung injury although his Chest x-ray remains unremarkable. Continue supportive care. Advance activity.   2. 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 Ammonia level was borderline, resumed lactulose, continue on PPI, ultrasound revealed minimal to no ascites not enough fluid to be. Stop Rocephin as no SBP clinically.    3. Acute renal failure with hyponatremia . He was dehydrated has resolved with hydration   4. History of alcohol abuse, ongoing cocaine abuse. Counseled to quit all. Says he's not been drinking alcohol lately. Monitor on CIWA protocol. Avoid beta blocker.   5. Essential hypertension. Continue  Norvasc.   6. Chr. diastolic CHF last EF [id number]. Appears compensated now, we'll check baseline bubble echogram.    Code Status : Full  Family Communication  : None present  Disposition Plan  : Stepdown  Consults  : PCCM  Procedures  :   Chest x-ray unremarkable.  VQ scan  Stable  US guided paracentesis - not enough fluid  Bubble TTE   DVT Prophylaxis  :   Heparin    Lab Results  Component Value Date   PLT 129* 07/08/2015    Inpatient Medications  Scheduled Meds: . amLODipine  10 mg Oral Daily  . aspirin  81 mg Oral Daily  . cefTRIAXone (ROCEPHIN)  IV  2 g Intravenous Q24H  . folic acid  1 mg Oral Daily  . heparin  5,000 Units Subcutaneous 3 times per day  . hydrALAZINE  50 mg Oral TID  . lactulose  10 g Oral TID  . LORazepam  0-4 mg Intravenous Q6H   Followed by  . [START ON 07/09/2015] LORazepam  0-4 mg Intravenous Q12H  . multivitamin with minerals  1 tablet Oral Daily  . pantoprazole  40 mg Oral Daily  . thiamine  100 mg Oral Daily   Continuous Infusions:   PRN Meds:.albuterol, LORazepam **OR** LORazepam, nitroGLYCERIN  Antibiotics  :     Anti-infectives    Start     Dose/Rate Route Frequency Ordered Stop   07/08/15 1600  cefTRIAXone (ROCEPHIN) 2 g in dextrose 5 %  50 mL IVPB     2 g 100 mL/hr over 30 Minutes Intravenous Every 24 hours 07/07/15 2115     07/07/15 1315  cefTRIAXone (ROCEPHIN) 1 g in dextrose 5 % 50 mL IVPB  Status:  Discontinued     1 g 100 mL/hr over 30 Minutes Intravenous Every 24 hours 07/07/15 1304 07/07/15 2113   07/07/15 1000  oseltamivir (TAMIFLU) capsule 30 mg  Status:  Discontinued     30 mg Oral 2 times daily 07/07/15 0948 07/07/15 1313   07/07/15 0645  levofloxacin (LEVAQUIN) IVPB 750 mg  Status:  Discontinued     750 mg 100 mL/hr over 90 Minutes Intravenous Every 24 hours 07/07/15 0632 07/07/15 1306        Objective:   Filed Vitals:   07/08/15 0300 07/08/15 0400 07/08/15 0500 07/08/15 0741  BP: 118/68 117/63 104/59  116/69  Pulse: 72 72 72 69  Temp:  98.1 F (36.7 C)  97.9 F (36.6 C)  TempSrc:    Oral  Resp: 18 20  17   Height:      Weight:      SpO2: 96% 93%  92%    Wt Readings from Last 3 Encounters:  07/07/15 81.647 kg (180 lb)  05/25/15 82.827 kg (182 lb 9.6 oz)  03/14/15 86.002 kg (189 lb 9.6 oz)     Intake/Output Summary (Last 24 hours) at 07/08/15 0939 Last data filed at 07/08/15 0500  Gross per 24 hour  Intake 1827.5 ml  Output   3000 ml  Net -1172.5 ml     Physical Exam  Awake Alert, Oriented X 3, No new F.N deficits, Normal affect [place].AT,PERRAL Supple Neck,No JVD, No cervical lymphadenopathy appriciated.  Symmetrical Chest wall movement, Good air movement bilaterally, few rales RRR,No Gallops,Rubs or new Murmurs, No Parasternal Heave +ve B.Sounds, Abd Soft with minimal ascites, No tenderness, No organomegaly appriciated, No rebound - guarding or rigidity. No Cyanosis, Clubbing or edema, No new Rash or bruise       Data Review:   Micro Results Recent Results (from the past 240 hour(s))  MRSA PCR Screening     Status: None   Collection Time: 07/07/15  9:04 PM  Result Value Ref Range Status   MRSA by PCR NEGATIVE NEGATIVE Final    Comment:        The GeneXpert MRSA Assay (FDA approved for NASAL specimens only), is one component of a comprehensive MRSA colonization surveillance program. It is not intended to diagnose MRSA infection nor to guide or monitor treatment for MRSA infections.     Radiology Reports Dg Chest 2 View  07/07/2015  CLINICAL DATA:  Shortness of breath, very weak for 2 days EXAM: CHEST  2 VIEW COMPARISON:  07/07/2015 FINDINGS: Mild cardiac enlargement is stable. The vascular pattern is normal. Lungs are clear. No pleural effusions. IMPRESSION: No active cardiopulmonary disease. Electronically Signed   By: Skipper Cliche M.D.   On: 07/07/2015 11:18   Dg Chest 2 View  07/07/2015  CLINICAL DATA:  Cough and shortness of breath for 3 days EXAM:  CHEST  2 VIEW COMPARISON:  05/22/2015 FINDINGS: Cardiac enlargement. Pulmonary vascularity appears normal. Shallow inspiration with atelectasis in the lung bases. No focal airspace disease or consolidation in the lungs. No blunting of costophrenic angles. No pneumothorax. Calcification in the left upper quadrant. IMPRESSION: Shallow inspiration with atelectasis in the lung bases. Cardiac enlargement without vascular congestion or edema. Electronically Signed   By: Oren Beckmann.D.  On: 07/07/2015 04:52   Nm Pulmonary Perf And Vent  07/07/2015  CLINICAL DATA:  Shortness of breath, pneumonia EXAM: NUCLEAR MEDICINE VENTILATION - PERFUSION LUNG SCAN TECHNIQUE: Ventilation images were obtained in multiple projections using inhaled aerosol Tc-38m DTPA. Perfusion images were obtained in multiple projections after intravenous injection of Tc-49m MAA. RADIOPHARMACEUTICALS:  31.1 mCi Technetium-58m DTPA aerosol inhalation and 4.1 mCi Technetium-52m MAA IV COMPARISON:  Correlate twin chest radiographs dated 07/07/2015 FINDINGS: Ventilation: No focal ventilation defect. Clumping in the central airways. Perfusion: No wedge shaped peripheral perfusion defects to suggest acute pulmonary embolism. IMPRESSION: Negative for pulmonary embolism. Electronically Signed   By: Julian Hy M.D.   On: 07/07/2015 14:55   US Abdomen Limited  07/07/2015  CLINICAL DATA:  Ascites, cirrhosis, substance abuse, hypertension, former smoker EXAM: LIMITED ABDOMEN ULTRASOUND FOR ASCITES TECHNIQUE: Limited ultrasound survey for ascites was performed in all four abdominal quadrants. COMPARISON:  05/23/2015 FINDINGS: No ascites identified. IMPRESSION: No ascites identified. Electronically Signed   By: Lavonia Dana M.D.   On: 07/07/2015 16:21   Dg Chest Port 1 View  07/08/2015  CLINICAL DATA:  Shortness of breath. EXAM: PORTABLE CHEST - 1 VIEW COMPARISON:  Two-view chest x-ray 07/07/2015. FINDINGS: Mild cardiac enlargement is stable. The  lungs are clear. There is no edema or effusion to suggest failure. No focal airspace disease is evident. IMPRESSION: Mild cardiomegaly without failure. Electronically Signed   By: San Morelle M.D.   On: 07/08/2015 08:47     CBC  Recent Labs Lab 07/07/15 0410 07/08/15 0235  WBC 5.4 5.5  HGB 13.4 12.7*  HCT 36.8* 36.5*  PLT 120* 129*  MCV 78.8 79.3  MCH 28.7 27.6  MCHC 36.4* 34.8  RDW 13.7 13.7  LYMPHSABS 1.7  --   MONOABS 1.2*  --   EOSABS 0.0  --   BASOSABS 0.1  --     Chemistries   Recent Labs Lab 07/07/15 0410 07/08/15 0235  NA 120* 135  K 4.8 4.3  CL 86* 104  CO2 20* 20*  GLUCOSE 102* 105*  BUN 37* 31*  CREATININE 2.07* 2.03*  CALCIUM 9.1 9.0  MG  --  2.4  AST 82* 65*  ALT 50 42  ALKPHOS 112 76  BILITOT 1.3* 0.9   ------------------------------------------------------------------------------------------------------------------  Recent Labs  07/08/15 0235  CHOL 125  HDL 39*  LDLCALC 66  TRIG 100  CHOLHDL 3.2    Lab Results  Component Value Date   HGBA1C 5.1 07/07/2015   ------------------------------------------------------------------------------------------------------------------ No results for input(s): TSH, T4TOTAL, T3FREE, THYROIDAB in the last 72 hours.  Invalid input(s): FREET3 ------------------------------------------------------------------------------------------------------------------ No results for input(s): VITAMINB12, FOLATE, FERRITIN, TIBC, IRON, RETICCTPCT in the last 72 hours.  Coagulation profile  Recent Labs Lab 07/07/15 0619  INR 1.19    No results for input(s): DDIMER in the last 72 hours.  Cardiac Enzymes  Recent Labs Lab 07/07/15 0731 07/07/15 1248 07/07/15 1856  TROPONINI 0.03 0.04* 0.03   ------------------------------------------------------------------------------------------------------------------    Component Value Date/Time   BNP 65.5 07/07/2015 0410    Time Spent in minutes    35   Daved Mcfann K M.D on 07/08/2015 at 9:39 AM  Between 7am to 7pm - Pager - [phone]  After 7pm go to [email or web address] - password [facility]  [facility] -  Office  [phone]

## 2015-07-09 ENCOUNTER — Inpatient Hospital Stay (HOSPITAL_COMMUNITY): Payer: Medicaid Other

## 2015-07-09 DIAGNOSIS — K7030 Alcoholic cirrhosis of liver without ascites: Secondary | ICD-10-CM

## 2015-07-09 DIAGNOSIS — R7989 Other specified abnormal findings of blood chemistry: Secondary | ICD-10-CM

## 2015-07-09 DIAGNOSIS — R079 Chest pain, unspecified: Secondary | ICD-10-CM

## 2015-07-09 NOTE — Evaluation (Signed)
Occupational Therapy Evaluation Patient Details Name: John [name] MRN: [id number] DOB: 06-Sep-1955 Today's Date: 07/09/2015    History of Present Illness John [name] is a 60 y.o. male with PMH of hypertension, stroke, diastolic congestive heart failure, polysubstance abuse, liver cirrhosis, GI bleeding, gastric versus, HCV, chronic back pain, chronic kidney disease-stage III, who presents with productive cough and shortness of breath--etilogy unclear   Clinical Impression   This 60 yo male admitted with above presents to acute OT with deficits below affecting his PLOF of Independent/Mod I for all basic ADLs with DME (was not using AE--but has). He currently cannot safely ambulate 100 feet on 2 liters of O2 with RW without feeling SOB and feeling like he will pass out. He only has a PCA worker 5 days a week supposedly for 2-3 hours/day (but pt reports she usually only stays 30 minutes and then she is gone). He reports he normally does his own basic ADLs, but currently he does not have the strength/energy to do his basic ADLs by himself safely. I am recommending D/C to SNF due to this.    Follow Up Recommendations  SNF    Equipment Recommendations  None recommended by OT       Precautions / Restrictions Precautions Precautions: Fall Precaution Comments: monitor O2 and HR Restrictions Weight Bearing Restrictions: No      Mobility Bed Mobility Overal bed mobility: Modified Independent                Transfers Overall transfer level: Needs assistance Equipment used: Rolling walker (2 wheeled) Transfers: Sit to/from Stand Sit to Stand: Min guard         General transfer comment: Pt ambulated 100 feet with min guard A; however has we got towards end HR up to 107 (from 74 at beginning of session) and pt c/o of increased SOB and feeling as though he would pass out    Balance Overall balance assessment: Needs assistance Sitting-balance support: Feet supported;No  upper extremity supported Sitting balance-Leahy Scale: Good     Standing balance support: Bilateral upper extremity supported Standing balance-Leahy Scale: Fair Standing balance comment: reliant on RW                            ADL Overall ADL's : Needs assistance/impaired Eating/Feeding: Independent;Sitting   Grooming: Set up;Sitting   Upper Body Bathing: Set up;Sitting   Lower Body Bathing: Minimal assistance (min guard A sit<>stand)   Upper Body Dressing : Set up;Sitting   Lower Body Dressing: Moderate assistance (min guard A sit<>stand)   Toilet Transfer: Min guard;Ambulation;Comfort height toilet;Grab bars;RW   Toileting- Water quality scientist and Hygiene: Min guard;Sit to/from stand         General ADL Comments: Pt educated throughout ambulation about purse lipped breathing and cued to do so               Pertinent Vitals/Pain Pain Assessment: No/denies pain     Hand Dominance Right   Extremity/Trunk Assessment Upper Extremity Assessment Upper Extremity Assessment: Overall WFL for tasks assessed           Communication Communication Communication: No difficulties   Cognition Arousal/Alertness: Awake/alert Behavior During Therapy: WFL for tasks assessed/performed Overall Cognitive Status: Within Functional Limits for tasks assessed                                Home Living  Family/patient expects to be discharged to:: Skilled nursing facility Living Arrangements: Alone Available Help at Discharge: Personal care attendant;Available PRN/intermittently (says his PCA should be there for 2-3 hours, but she usually only stays about 30 minutes 5 days/week) Type of Home: Apartment Home Access: Elevator     Home Layout: One level     Bathroom Shower/Tub: Teacher, early years/pre: Handicapped height     Home Equipment: [place] - single point;Walker - 2 wheels;Shower seat;Adaptive equipment;Grab bars - toilet;Grab bars  - tub/shower Adaptive Equipment: Reacher;Sock aid        Prior Functioning/Environment Level of Independence: Independent        Comments: doesn't drive, [place]    OT Diagnosis: Generalized weakness   OT Problem List: Decreased strength;Impaired balance (sitting and/or standing);Cardiopulmonary status limiting activity;Decreased knowledge of use of DME or AE   OT Treatment/Interventions: Self-care/ADL training;Patient/family education;Balance training;DME and/or AE instruction;Therapeutic activities    OT Goals(Current goals can be found in the care plan section) Acute Rehab OT Goals Patient Stated Goal: To be able to take care of myself  OT Goal Formulation: With patient Time For Goal Achievement: 07/16/15 Potential to Achieve Goals: Good  OT Frequency: Min 2X/week   Barriers to D/C: Decreased caregiver support             End of Session Equipment Utilized During Treatment: Gait belt;Rolling walker;Oxygen (2 liters) Nurse Communication: Mobility status (let pt on RA due to at end of session on RA pt 95%)  Activity Tolerance: Patient limited by fatigue (limited by SOB and feeling at though he was going to pass out--BP at end of session 136/78) Patient left: in bed;with call bell/phone within reach;with bed alarm set   Time: 1345-1409 OT Time Calculation (min): 24 min Charges:  OT General Charges $OT Visit: 1 Procedure OT Evaluation $OT Eval Moderate Complexity: 1 Procedure OT Treatments $Self Care/Home Management : 8-22 mins  Almon Register  [id number] 07/09/2015, 2:26 PM

## 2015-07-09 NOTE — Progress Notes (Signed)
Saline bubble study performed. Echo reading pending.  Kerin Ransom PA-C 07/09/2015 9:13 AM

## 2015-07-09 NOTE — Progress Notes (Signed)
  Echocardiogram 2D Echocardiogram has been performed.  John [name] 07/09/2015, 9:21 AM

## 2015-07-09 NOTE — Progress Notes (Signed)
Ambulated patient 40 feet in hallway. Noted short of breath and reports "feel like going to pass out" Saturations 92%-95% on room air. Returned to bed and remains on room. No distress at rest noted.

## 2015-07-09 NOTE — Progress Notes (Signed)
Patient Demographics:    John [name], is a 60 y.o. male, DOB - 1955-06-26, [id number]  Admit date - 07/07/2015   Admitting Physician Ivor Costa, MD  Outpatient Primary MD for the patient is Elizabeth Palau, MD  LOS - 2   Chief Complaint  Patient presents with  . Shortness of Breath        Subjective:    John [name] today has, No headache, No chest pain, No abdominal pain - No Nausea, No new weakness tingling or numbness, No Cough - SOB.    Assessment  & Plan :     1. Hypoxic respiratory failure. Etiology not entirely clear, chest x-ray 2 was unremarkable, BNP is normal, pulmonary exam is underwhelming as well. Could not get CT angiogram due to renal failure, had a unremarkable VQ scan, shortness of breath is much improved this morning and he is down to 2 L nasal cannula, his pro-calcitonin and lactate levels were normal as well with a negative influenza screen. Pulmonary was consulted for severe hypoxia of unclear etiology, bubble echo is pending ordered by  pulmonary to rule out shunting, question shunt versus cocaine induced lung injury although his Chest x-ray remains unremarkable. Continue supportive care. Advance activity. If echo stable and likely discharge in the morning discussed with Dr. Halford Chessman pulmonary on 07/09/2015.   2. 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 Ammonia level was borderline, resumed lactulose, continue on PPI, ultrasound revealed minimal to no ascites not enough fluid to be. Stopped Rocephin as no SBP clinically.    3. Acute renal failure with hyponatremia . He was dehydrated has resolved with hydration   4. History of alcohol abuse, ongoing cocaine abuse. Counseled to quit all.  Says he's not been drinking alcohol lately. Monitor on CIWA protocol. Avoid beta blocker.   5. Essential hypertension. Continue Norvasc.   6. Chr. diastolic CHF last EF [id number]. Appears compensated now, pending echogram.    Code Status : Full  Family Communication  : None present  Disposition Plan  : Stepdown  Consults  : PCCM  Procedures  :   Chest x-ray unremarkable.  VQ scan  Stable  US guided paracentesis - not enough fluid  Bubble TTE   DVT Prophylaxis  :   Heparin    Lab Results  Component Value Date   PLT 129* 07/08/2015    Inpatient Medications  Scheduled Meds: . amLODipine  10 mg Oral Daily  . aspirin  81 mg Oral Daily  . feeding supplement (ENSURE ENLIVE)  237 mL Oral BID BM  . folic acid  1 mg Oral Daily  . heparin  5,000 Units Subcutaneous 3 times per day  . hydrALAZINE  50 mg Oral TID  . lactulose  10 g Oral TID  . LORazepam  0-4 mg Intravenous Q6H   Followed by  . LORazepam  0-4 mg Intravenous Q12H  . multivitamin with minerals  1 tablet Oral Daily  . pantoprazole  40 mg Oral Daily  . thiamine  100 mg Oral Daily   Continuous Infusions:   PRN Meds:.albuterol, LORazepam **OR** LORazepam, nitroGLYCERIN  Antibiotics  :     Anti-infectives    Start  Dose/Rate Route Frequency Ordered Stop   07/08/15 1600  cefTRIAXone (ROCEPHIN) 2 g in dextrose 5 % 50 mL IVPB  Status:  Discontinued     2 g 100 mL/hr over 30 Minutes Intravenous Every 24 hours 07/07/15 2115 07/08/15 0942   07/07/15 1315  cefTRIAXone (ROCEPHIN) 1 g in dextrose 5 % 50 mL IVPB  Status:  Discontinued     1 g 100 mL/hr over 30 Minutes Intravenous Every 24 hours 07/07/15 1304 07/07/15 2113   07/07/15 1000  oseltamivir (TAMIFLU) capsule 30 mg  Status:  Discontinued     30 mg Oral 2 times daily 07/07/15 0948 07/07/15 1313   07/07/15 0645  levofloxacin (LEVAQUIN) IVPB 750 mg  Status:  Discontinued     750 mg 100 mL/hr over 90 Minutes Intravenous Every 24 hours 07/07/15 0632 07/07/15  1306        Objective:   Filed Vitals:   07/09/15 0000 07/09/15 0537 07/09/15 0600 07/09/15 0917  BP: 104/56 129/85 122/81 104/68  Pulse: 79 67 68 74  Temp: 97.8 F (36.6 C) 97.9 F (36.6 C)  97.9 F (36.6 C)  TempSrc: Oral Oral  Oral  Resp: 19 20 13 15   Height:      Weight:      SpO2: 95% 95% 96% 93%    Wt Readings from Last 3 Encounters:  07/07/15 81.647 kg (180 lb)  05/25/15 82.827 kg (182 lb 9.6 oz)  03/14/15 86.002 kg (189 lb 9.6 oz)     Intake/Output Summary (Last 24 hours) at 07/09/15 0936 Last data filed at 07/09/15 0919  Gross per 24 hour  Intake    280 ml  Output   1000 ml  Net   -720 ml     Physical Exam  Awake Alert, Oriented X 3, No new F.N deficits, Normal affect [place].AT,PERRAL Supple Neck,No JVD, No cervical lymphadenopathy appriciated.  Symmetrical Chest wall movement, Good air movement bilaterally, few rales RRR,No Gallops,Rubs or new Murmurs, No Parasternal Heave +ve B.Sounds, Abd Soft with minimal ascites, No tenderness, No organomegaly appriciated, No rebound - guarding or rigidity. No Cyanosis, Clubbing or edema, No new Rash or bruise       Data Review:   Micro Results Recent Results (from the past 240 hour(s))  Culture, blood (routine x 2) Call MD if unable to obtain prior to antibiotics being given     Status: None (Preliminary result)   Collection Time: 07/07/15  4:10 AM  Result Value Ref Range Status   Specimen Description BLOOD RIGHT ANTECUBITAL  Final   Special Requests BOTTLES DRAWN AEROBIC AND ANAEROBIC 5CC  Final   Culture NO GROWTH 2 DAYS  Final   Report Status PENDING  Incomplete  Culture, blood (routine x 2) Call MD if unable to obtain prior to antibiotics being given     Status: None (Preliminary result)   Collection Time: 07/07/15  7:36 AM  Result Value Ref Range Status   Specimen Description BLOOD RIGHT HAND  Final   Special Requests BOTTLES DRAWN AEROBIC AND ANAEROBIC 5CC  Final   Culture NO GROWTH 2 DAYS  Final    Report Status PENDING  Incomplete  Urine culture     Status: None   Collection Time: 07/07/15  3:35 PM  Result Value Ref Range Status   Specimen Description URINE, CATHETERIZED  Final   Special Requests NONE  Final   Culture NO GROWTH 1 DAY  Final   Report Status 07/08/2015 FINAL  Final  MRSA PCR  Screening     Status: None   Collection Time: 07/07/15  9:04 PM  Result Value Ref Range Status   MRSA by PCR NEGATIVE NEGATIVE Final    Comment:        The GeneXpert MRSA Assay (FDA approved for NASAL specimens only), is one component of a comprehensive MRSA colonization surveillance program. It is not intended to diagnose MRSA infection nor to guide or monitor treatment for MRSA infections.     Radiology Reports Dg Chest 2 View  07/07/2015  CLINICAL DATA:  Shortness of breath, very weak for 2 days EXAM: CHEST  2 VIEW COMPARISON:  07/07/2015 FINDINGS: Mild cardiac enlargement is stable. The vascular pattern is normal. Lungs are clear. No pleural effusions. IMPRESSION: No active cardiopulmonary disease. Electronically Signed   By: Skipper Cliche M.D.   On: 07/07/2015 11:18   Dg Chest 2 View  07/07/2015  CLINICAL DATA:  Cough and shortness of breath for 3 days EXAM: CHEST  2 VIEW COMPARISON:  05/22/2015 FINDINGS: Cardiac enlargement. Pulmonary vascularity appears normal. Shallow inspiration with atelectasis in the lung bases. No focal airspace disease or consolidation in the lungs. No blunting of costophrenic angles. No pneumothorax. Calcification in the left upper quadrant. IMPRESSION: Shallow inspiration with atelectasis in the lung bases. Cardiac enlargement without vascular congestion or edema. Electronically Signed   By: Lucienne Capers M.D.   On: 07/07/2015 04:52   Nm Pulmonary Perf And Vent  07/07/2015  CLINICAL DATA:  Shortness of breath, pneumonia EXAM: NUCLEAR MEDICINE VENTILATION - PERFUSION LUNG SCAN TECHNIQUE: Ventilation images were obtained in multiple projections using  inhaled aerosol Tc-108m DTPA. Perfusion images were obtained in multiple projections after intravenous injection of Tc-50m MAA. RADIOPHARMACEUTICALS:  31.1 mCi Technetium-6m DTPA aerosol inhalation and 4.1 mCi Technetium-24m MAA IV COMPARISON:  Correlate twin chest radiographs dated 07/07/2015 FINDINGS: Ventilation: No focal ventilation defect. Clumping in the central airways. Perfusion: No wedge shaped peripheral perfusion defects to suggest acute pulmonary embolism. IMPRESSION: Negative for pulmonary embolism. Electronically Signed   By: Julian Hy M.D.   On: 07/07/2015 14:55   US Abdomen Limited  07/07/2015  CLINICAL DATA:  Ascites, cirrhosis, substance abuse, hypertension, former smoker EXAM: LIMITED ABDOMEN ULTRASOUND FOR ASCITES TECHNIQUE: Limited ultrasound survey for ascites was performed in all four abdominal quadrants. COMPARISON:  05/23/2015 FINDINGS: No ascites identified. IMPRESSION: No ascites identified. Electronically Signed   By: Lavonia Dana M.D.   On: 07/07/2015 16:21   Dg Chest Port 1 View  07/08/2015  CLINICAL DATA:  Shortness of breath. EXAM: PORTABLE CHEST - 1 VIEW COMPARISON:  Two-view chest x-ray 07/07/2015. FINDINGS: Mild cardiac enlargement is stable. The lungs are clear. There is no edema or effusion to suggest failure. No focal airspace disease is evident. IMPRESSION: Mild cardiomegaly without failure. Electronically Signed   By: San Morelle M.D.   On: 07/08/2015 08:47     CBC  Recent Labs Lab 07/07/15 0410 07/08/15 0235  WBC 5.4 5.5  HGB 13.4 12.7*  HCT 36.8* 36.5*  PLT 120* 129*  MCV 78.8 79.3  MCH 28.7 27.6  MCHC 36.4* 34.8  RDW 13.7 13.7  LYMPHSABS 1.7  --   MONOABS 1.2*  --   EOSABS 0.0  --   BASOSABS 0.1  --     Chemistries   Recent Labs Lab 07/07/15 0410 07/08/15 0235  NA 120* 135  K 4.8 4.3  CL 86* 104  CO2 20* 20*  GLUCOSE 102* 105*  BUN 37* 31*  CREATININE 2.07* 2.03*  CALCIUM 9.1 9.0  MG  --  2.4  AST 82* 65*  ALT 50  42  ALKPHOS 112 76  BILITOT 1.3* 0.9   ------------------------------------------------------------------------------------------------------------------  Recent Labs  07/08/15 0235  CHOL 125  HDL 39*  LDLCALC 66  TRIG 100  CHOLHDL 3.2    Lab Results  Component Value Date   HGBA1C 5.1 07/07/2015   ------------------------------------------------------------------------------------------------------------------ No results for input(s): TSH, T4TOTAL, T3FREE, THYROIDAB in the last 72 hours.  Invalid input(s): FREET3 ------------------------------------------------------------------------------------------------------------------ No results for input(s): VITAMINB12, FOLATE, FERRITIN, TIBC, IRON, RETICCTPCT in the last 72 hours.  Coagulation profile  Recent Labs Lab 07/07/15 0619  INR 1.19    No results for input(s): DDIMER in the last 72 hours.  Cardiac Enzymes  Recent Labs Lab 07/07/15 0731 07/07/15 1248 07/07/15 1856  TROPONINI 0.03 0.04* 0.03   ------------------------------------------------------------------------------------------------------------------    Component Value Date/Time   BNP 65.5 07/07/2015 0410    Time Spent in minutes   35   Royann Wildasin K M.D on 07/09/2015 at 9:36 AM  Between 7am to 7pm - Pager - [phone]  After 7pm go to [email or web address] - password [facility]  [facility] -  Office  [phone]

## 2015-07-10 MED ORDER — FUROSEMIDE 40 MG PO TABS: 40.0000 mg | ORAL_TABLET | Freq: Every day | ORAL | Status: DC

## 2015-07-10 MED ORDER — PANTOPRAZOLE SODIUM 40 MG PO TBEC: 40.0000 mg | DELAYED_RELEASE_TABLET | Freq: Every day | ORAL | Status: DC

## 2015-07-10 MED ORDER — FOLIC ACID 1 MG PO TABS: 1.0000 mg | ORAL_TABLET | Freq: Every day | ORAL | Status: DC

## 2015-07-10 MED ORDER — THIAMINE HCL 100 MG PO TABS: 100.0000 mg | ORAL_TABLET | Freq: Every day | ORAL | Status: DC

## 2015-07-10 MED ORDER — LACTULOSE 10 GM/15ML PO SOLN: 10.0000 g | Freq: Every day | ORAL | Status: DC

## 2015-07-10 NOTE — Progress Notes (Signed)
Physical Therapy Treatment Patient Details Name: John [name] MRN: [id number] DOB: 1956-02-03 Today's Date: 07/10/2015    History of Present Illness John [name] is a 60 y.o. 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 Pt currently with functional limitations due to decreased strength, endurance, and balance. Pt did well with therapy today and was able to ambulate much further (~400 feet) with supervsion with RW. Pt lives alone but states that he is going to stay with his mother for a while until he gets better. Pt will benefit from skilled PT to increase their independence and safety with mobility to allow discharge home with his mother.    Follow Up Recommendations  Home health PT;Supervision for mobility/OOB     Equipment Recommendations  None recommended by PT    Recommendations for Other Services       Precautions / Restrictions Precautions Precautions: Fall Precaution Comments: monitor O2 and HR Restrictions Weight Bearing Restrictions: No    Mobility  Bed Mobility Overal bed mobility: Modified Independent                Transfers Overall transfer level: Needs assistance Equipment used: Rolling walker (2 wheeled) Transfers: Sit to/from Stand Sit to Stand: Supervision         General transfer comment: Supervision for safety. Vitals WNL during transfers and functional ambulation using RW.   Ambulation/Gait Ambulation/Gait assistance: Supervision Ambulation Distance (Feet): 400 Feet Assistive device: Rolling walker (2 wheeled) Gait Pattern/deviations: Step-through pattern;Decreased stride length;Trunk flexed;Narrow base of support Gait velocity: decrased Gait velocity  interpretation: Below normal speed for age/gender General Gait Details: Pt reliant on RW but is stable with RW. No LOB and was able to go a good distance without stopping.    Stairs            Wheelchair Mobility    Modified Rankin (Stroke Patients Only)       Balance Overall balance assessment: Needs assistance Sitting-balance support: No upper extremity supported;Feet supported Sitting balance-Leahy Scale: Good     Standing balance support: No upper extremity supported;During functional activity Standing balance-Leahy Scale: Fair Standing balance comment: Pt standing at sink to brush teeth and wash face with no UE support                     Cognition Arousal/Alertness: Awake/alert Behavior During Therapy: WFL for tasks assessed/performed Overall Cognitive Status: Within Functional Limits for tasks assessed                      Exercises      General Comments General comments (skin integrity, edema, etc.): Pt pleasant and agreeable to therapy. Pt is very ready to go home and states that he would much rather just stay with his mom than go to a SNF. Pt has Medicaid and does not qualify for PT services after discharge.       Pertinent Vitals/Pain Pain Assessment: No/denies pain  SaO2 >95% on room air with ambulation.     Home Living                      Prior Function            PT Goals (current goals can now  be found in the care plan section) Acute Rehab PT Goals Patient Stated Goal: go home today PT Goal Formulation: With patient Time For Goal Achievement: 07/15/15 Potential to Achieve Goals: Good Progress towards PT goals: Progressing toward goals    Frequency  Min 3X/week    PT Plan Current plan remains appropriate    Co-evaluation             End of Session Equipment Utilized During Treatment: Gait belt Activity Tolerance: Patient tolerated treatment well Patient left: in bed;with call bell/phone within reach      Time: 0908-0925 PT Time Calculation (min) (ACUTE ONLY): 17 min  Charges:  $Gait Training: 8-22 mins                    G Codes:      Colon Branch, SPT Colon Branch 07/10/2015, 12:19 PM

## 2015-07-10 NOTE — Care Management Note (Addendum)
Case Management Note  Patient Details  Name: John [name] MRN: [id number] Date of Birth: Jan 18, 1956  Subjective/Objective:     Pt lives in [facility] [place], does not have a qualifying dx for home health PT but states he is going to his mother's apt in [place] [facility] so that she can assist him until he is stronger.  Agrees to home health RN who can assess heart/lung sounds, compliance with meds and hospital f/u.  Primary care provider is @ [facility].  Provided list of home health agencies, referral made to [place] per choice.                         Expected Discharge Plan:  [place]  Discharge planning Services  CM Consult  Post Acute Care Choice:  Home Health Choice offered to:  Patient  HH Arranged:  RN [facility] Agency:  [place]  Status of Service:  Completed, signed off  Babette Relic RN CM 07/10/2015, 12:23 PM

## 2015-07-10 NOTE — Discharge Summary (Addendum)
Laurie Dealmeida, is a 60 y.o. male  DOB [birth year]  MRN [id number].  Admission date:  07/07/2015  Admitting Physician  Ivor Costa, MD  Discharge Date:  07/10/2015   Primary MD  Elizabeth Palau, MD  Recommendations for primary care physician for things to follow:   Monitor weight, BMP and diuretic dose closely. Check BMP and CBC in 2-3 days.   Admission Diagnosis  Hyponatremia [E87.1] SOB (shortness of breath) [R06.02] History of cirrhosis [Z87.19] Ascites [R18.8]   Discharge Diagnosis  Hyponatremia [E87.1] SOB (shortness of breath) [R06.02] History of cirrhosis [Z87.19] Ascites [R18.8]     Principal Problem:   Acute respiratory failure (HCC) Active Problems:   Polysubstance abuse   SOB (shortness of breath)   Thrombocytopenia (HCC)   Elevated troponin   Ascites   Cirrhosis (HCC)   Upper GI bleed   Gastric varices   Hepatitis C   Chronic kidney disease, stage 3   Hyponatremia   Dehydration      Past Medical History  Diagnosis Date  . Hypertension   . Chronic back pain   . Stroke ([place]) 1998  . Polysubstance abuse   . Optic neuropathy, left   . CHF (congestive heart failure) ([place])   . Ascites   . ETOH abuse   . Cirrhosis [facility])     Past Surgical History  Procedure Laterality Date  . None    . Esophagogastroduodenoscopy N/A 08/14/2014    Procedure: ESOPHAGOGASTRODUODENOSCOPY (EGD);  Surgeon: Ladene Artist, MD;  Location: [facility];  Service: Endoscopy;  Laterality: N/A;  . Radiology with anesthesia N/A 08/15/2014    Procedure: RADIOLOGY WITH ANESTHESIA;  Surgeon: Greggory Keen, MD;  Location: [facility];  Service: Radiology;  Laterality: N/A;       HPI  from the history and physical done on the day of admission:    Angeldaniel Pepitone is a 60 y.o. male with PMH of hypertension,  stroke, diastolic congestive heart failure, polysubstance abuse, liver cirrhosis, GI bleeding, gastric versus, HCV, chronic back pain, chronic kidney disease-stage III, who presents with productive cough and shortness of breath.  Patient reports that he has been having shortness of breath for several days, which has been progressively getting worse. He has cough with greenish colored sputum production. No fever or chills. Patient does not have chest pain. He does not have runny nose or sore throat. He has nausea, but no vomiting, diarrhea, abdominal pain, symptoms of UTI. No unilateral weakness.  In ED, patient was found to have WBC 5.4, temperature normal, no tachycardia, transient tachypnea, sodium 120, renal function at baseline, troponin slightly elevated at 0.04, BNP 65.5. CXR showed shallow inspiration with atelectasis in the lung bases. Cardiac enlargement without vascular congestion or edema. Patient is admitted to inpatient for further reevaluation and treatment.     Hospital Course:     1. Hypoxic respiratory failure. Etiology not entirely clear, chest x-ray 2 was unremarkable, BNP is normal, pulmonary exam is underwhelming as well. Could not get CT angiogram due to renal failure,  had a unremarkable VQ scan, shortness of breath is much improved this morning and he is down to 2 L nasal cannula, his pro-calcitonin and lactate levels were normal as well with a negative influenza screen. Pulmonary was consulted for severe hypoxia of unclear etiology, Bubble echo was unremarkable with grade 1 chronic diastolic CHF and no evidence of shunting, he likely had mild cocaine-induced lung injury with unremarkable chest x-ray, his symptoms have now completely resolved, he is off oxygen, discussed with Dr. Halford Chessman pulmonary on 07/09/2015 he will be discharged today.   2. 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 Ammonia level was  borderline, resumed lactulose, continue on PPI, ultrasound revealed minimal to no ascites not enough fluid to be. Stopped Rocephin as no SBP clinically.    3. Acute renal failure with hyponatremia . He was dehydrated has resolved with hydration   4. History of alcohol abuse, ongoing cocaine abuse. Counseled to quit all. Says he's not been drinking alcohol lately. No signs of DTs this admission.   5. Essential hypertension. Continue Norvasc.   6. Chr. diastolic CHF last EF [id number]. Appears compensated now, have cut down his diuretics considerably upon discharge as he was extremely dry and hypernatremic upon admission, request PCP to monitor weight, BMP and diuretic dose closely upon discharge. We will avoid beta blocker due to ongoing cocaine use.   7. Mod PCM - Protein supplements were provided.     Discharge Condition: Stable  Follow UP  Follow-up Information    Follow up with Elizabeth Palau, MD. Schedule an appointment as soon as possible for a visit in 2 days.   Specialty:  Family Medicine       Consults obtained - Pulmonary  Diet and Activity recommendation: See Discharge Instructions below  Discharge Instructions       Discharge Instructions    Discharge instructions    Complete by:  As directed   Follow with Primary MD Elizabeth Palau, MD in 2-3 days   Get CBC, CMP, 2 view Chest X ray checked  by Primary MD next visit.    Activity: As tolerated with Full fall precautions use walker/cane & assistance as needed   Disposition Home     Diet:   [place] your Weight same time everyday, if you gain over 2 pounds, or you develop in leg swelling, experience more shortness of breath or chest pain, call your Primary MD immediately. Follow Cardiac Low Salt Diet and 1.5 lit/day fluid restriction.   On your next visit with your primary care physician please Get Medicines reviewed and adjusted.   Please request your Prim.MD to go over all Hospital Tests  and Procedure/Radiological results at the follow up, please get all Hospital records sent to your Prim MD by signing hospital release before you go home.   If you experience worsening of your admission symptoms, develop shortness of breath, life threatening emergency, suicidal or homicidal thoughts you must seek medical attention immediately by calling 911 or calling your MD immediately  if symptoms less severe.  You Must read complete instructions/literature along with all the possible adverse reactions/side effects for all the Medicines you take and that have been prescribed to you. Take any new Medicines after you have completely understood and accpet all the possible adverse reactions/side effects.   Do not drive, operating heavy machinery, perform activities at heights, swimming or participation in water activities or provide baby sitting services if  your were admitted for syncope or siezures until you have seen by Primary MD or a Neurologist and advised to do so again.  Do not drive when taking Pain medications.    Do not take more than prescribed Pain, Sleep and Anxiety Medications  Special Instructions: If you have smoked or chewed Tobacco  in the last 2 yrs please stop smoking, stop any regular Alcohol  and or any Recreational drug use.  Wear Seat belts while driving.   Please note  You were cared for by a hospitalist during your hospital stay. If you have any questions about your discharge medications or the care you received while you were in the hospital after you are discharged, you can call the unit and asked to speak with the hospitalist on call if the hospitalist that took care of you is not available. Once you are discharged, your primary care physician will handle any further medical issues. Please note that NO REFILLS for any discharge medications will be authorized once you are discharged, as it is imperative that you return to your primary care physician (or establish a  relationship with a primary care physician if you do not have one) for your aftercare needs so that they can reassess your need for medications and monitor your lab values.     Increase activity slowly    Complete by:  As directed              Discharge Medications       Medication List    STOP taking these medications        levofloxacin 750 MG tablet  Commonly known as:  LEVAQUIN     spironolactone 100 MG tablet  Commonly known as:  ALDACTONE     spironolactone 25 MG tablet  Commonly known as:  ALDACTONE      TAKE these medications        amLODipine 10 MG tablet  Commonly known as:  NORVASC  Take 1 tablet (10 mg total) by mouth daily.     folic acid 1 MG tablet  Commonly known as:  FOLVITE  Take 1 tablet (1 mg total) by mouth daily.     furosemide 40 MG tablet  Commonly known as:  LASIX  Take 1 tablet (40 mg total) by mouth daily.     hydrALAZINE 50 MG tablet  Commonly known as:  APRESOLINE  Take 1 tablet (50 mg total) by mouth 3 (three) times daily.     lactulose 10 GM/15ML solution  Commonly known as:  CHRONULAC  Take 15 mLs (10 g total) by mouth daily.     pantoprazole 40 MG tablet  Commonly known as:  PROTONIX  Take 1 tablet (40 mg total) by mouth daily.     thiamine 100 MG tablet  Take 1 tablet (100 mg total) by mouth daily.        Major procedures and Radiology Reports - PLEASE review detailed and final reports for all details, in brief -   TTE with Bubble  Moderate LVH with LVEF 70-75%. Grade 1 diastolic dysfunction with normal estimated LV filling pressure. Mild left atrial enlargement. No definite secundum ASD or patent foramen ovale was identified. Limited images of agitated saline study do not indicate right to left shunting.   Dg Chest 2 View  07/07/2015  CLINICAL DATA:  Shortness of breath, very weak for 2 days EXAM: CHEST  2 VIEW COMPARISON:  07/07/2015 FINDINGS: Mild cardiac enlargement is stable. The vascular pattern is  normal.  Lungs are clear. No pleural effusions. IMPRESSION: No active cardiopulmonary disease. Electronically Signed   By: Skipper Cliche M.D.   On: 07/07/2015 11:18   Dg Chest 2 View  07/07/2015  CLINICAL DATA:  Cough and shortness of breath for 3 days EXAM: CHEST  2 VIEW COMPARISON:  05/22/2015 FINDINGS: Cardiac enlargement. Pulmonary vascularity appears normal. Shallow inspiration with atelectasis in the lung bases. No focal airspace disease or consolidation in the lungs. No blunting of costophrenic angles. No pneumothorax. Calcification in the left upper quadrant. IMPRESSION: Shallow inspiration with atelectasis in the lung bases. Cardiac enlargement without vascular congestion or edema. Electronically Signed   By: Lucienne Capers M.D.   On: 07/07/2015 04:52   Nm Pulmonary Perf And Vent  07/07/2015  CLINICAL DATA:  Shortness of breath, pneumonia EXAM: NUCLEAR MEDICINE VENTILATION - PERFUSION LUNG SCAN TECHNIQUE: Ventilation images were obtained in multiple projections using inhaled aerosol Tc-27m DTPA. Perfusion images were obtained in multiple projections after intravenous injection of Tc-45m MAA. RADIOPHARMACEUTICALS:  31.1 mCi Technetium-41m DTPA aerosol inhalation and 4.1 mCi Technetium-29m MAA IV COMPARISON:  Correlate twin chest radiographs dated 07/07/2015 FINDINGS: Ventilation: No focal ventilation defect. Clumping in the central airways. Perfusion: No wedge shaped peripheral perfusion defects to suggest acute pulmonary embolism. IMPRESSION: Negative for pulmonary embolism. Electronically Signed   By: Julian Hy M.D.   On: 07/07/2015 14:55   US Abdomen Limited  07/07/2015  CLINICAL DATA:  Ascites, cirrhosis, substance abuse, hypertension, former smoker EXAM: LIMITED ABDOMEN ULTRASOUND FOR ASCITES TECHNIQUE: Limited ultrasound survey for ascites was performed in all four abdominal quadrants. COMPARISON:  05/23/2015 FINDINGS: No ascites identified. IMPRESSION: No ascites identified. Electronically  Signed   By: Lavonia Dana M.D.   On: 07/07/2015 16:21   Dg Chest Port 1 View  07/08/2015  CLINICAL DATA:  Shortness of breath. EXAM: PORTABLE CHEST - 1 VIEW COMPARISON:  Two-view chest x-ray 07/07/2015. FINDINGS: Mild cardiac enlargement is stable. The lungs are clear. There is no edema or effusion to suggest failure. No focal airspace disease is evident. IMPRESSION: Mild cardiomegaly without failure. Electronically Signed   By: San Morelle M.D.   On: 07/08/2015 08:47    Micro Results      Recent Results (from the past 240 hour(s))  Culture, blood (routine x 2) Call MD if unable to obtain prior to antibiotics being given     Status: None (Preliminary result)   Collection Time: 07/07/15  4:10 AM  Result Value Ref Range Status   Specimen Description BLOOD RIGHT ANTECUBITAL  Final   Special Requests BOTTLES DRAWN AEROBIC AND ANAEROBIC 5CC  Final   Culture NO GROWTH 2 DAYS  Final   Report Status PENDING  Incomplete  Culture, blood (routine x 2) Call MD if unable to obtain prior to antibiotics being given     Status: None (Preliminary result)   Collection Time: 07/07/15  7:36 AM  Result Value Ref Range Status   Specimen Description BLOOD RIGHT HAND  Final   Special Requests BOTTLES DRAWN AEROBIC AND ANAEROBIC 5CC  Final   Culture NO GROWTH 2 DAYS  Final   Report Status PENDING  Incomplete  Urine culture     Status: None   Collection Time: 07/07/15  3:35 PM  Result Value Ref Range Status   Specimen Description URINE, CATHETERIZED  Final   Special Requests NONE  Final   Culture NO GROWTH 1 DAY  Final   Report Status 07/08/2015 FINAL  Final  MRSA PCR Screening  Status: None   Collection Time: 07/07/15  9:04 PM  Result Value Ref Range Status   MRSA by PCR NEGATIVE NEGATIVE Final    Comment:        The GeneXpert MRSA Assay (FDA approved for NASAL specimens only), is one component of a comprehensive MRSA colonization surveillance program. It is not intended to diagnose  MRSA infection nor to guide or monitor treatment for MRSA infections.        Today   Subjective    Dshon Orick today has no headache,no chest abdominal pain,no new weakness tingling or numbness, feels much better wants to go home today.     Objective   Blood pressure 128/76, pulse 70, temperature 98.4 F (36.9 C), temperature source Oral, resp. rate 13, height 5\' 11"  (1.803 m), weight 81.647 kg (180 lb), SpO2 97 %.   Intake/Output Summary (Last 24 hours) at 07/10/15 1058 Last data filed at 07/10/15 0900  Gross per 24 hour  Intake    720 ml  Output   1010 ml  Net   -290 ml    Exam Awake Alert, Oriented x 3, No new F.N deficits, Normal affect [place].AT,PERRAL Supple Neck,No JVD, No cervical lymphadenopathy appriciated.  Symmetrical Chest wall movement, Good air movement bilaterally, CTAB RRR,No Gallops,Rubs or new Murmurs, No Parasternal Heave +ve B.Sounds, Abd Soft, Non tender, No organomegaly appriciated, No rebound -guarding or rigidity. No Cyanosis, Clubbing or edema, No new Rash or bruise   Data Review   CBC w Diff: Lab Results  Component Value Date   WBC 5.5 07/08/2015   HGB 12.7* 07/08/2015   HCT 36.5* 07/08/2015   PLT 129* 07/08/2015   LYMPHOPCT 31 07/07/2015   MONOPCT 22 07/07/2015   EOSPCT 0 07/07/2015   BASOPCT 1 07/07/2015    CMP: Lab Results  Component Value Date   NA 135 07/08/2015   K 4.3 07/08/2015   CL 104 07/08/2015   CO2 20* 07/08/2015   BUN 31* 07/08/2015   CREATININE 2.03* 07/08/2015   CREATININE 2.52* 11/18/2014   PROT 7.4 07/08/2015   ALBUMIN 3.1* 07/08/2015   BILITOT 0.9 07/08/2015   ALKPHOS 76 07/08/2015   AST 65* 07/08/2015   ALT 42 07/08/2015  .   Total Time in preparing paper work, data evaluation and todays exam - 35 minutes  Thurnell Lose M.D on 07/10/2015 at 10:58 AM  [facility]   Office  [phone]

## 2015-07-10 NOTE — Progress Notes (Addendum)
Occupational Therapy Treatment Patient Details Name: John [name] MRN: [id number] DOB: [birth year] Today's Date: 07/10/2015    History of present illness John [name] is a 60 y.o. male with PMH of hypertension, stroke, diastolic congestive heart failure, polysubstance abuse, liver cirrhosis, GI bleeding, gastric versus, HCV, chronic back pain, chronic kidney disease-stage III, who presents with productive cough and shortness of breath--etilogy unclear   OT comments  Patient making great progress and met all LTGs, upgraded goals to mod I level. Pt is currently functioning at an overall supervision level using RW. Patient's vitals WNL entire session (HR, RR, 02 sats). Pt plans to discharge home with his mother until "I'm able to manage on my own". As stated below, administered LH sponge, LH shoe horn, and educational handout on energy conservation to patient.   Follow Up Recommendations  No OT follow up;Supervision - Intermittent    Equipment Recommendations  None recommended by OT    Recommendations for Other Services  None at this time  Precautions / Restrictions Precautions Precautions: Fall Precaution Comments: monitor O2 and HR Restrictions Weight Bearing Restrictions: No    Mobility Bed Mobility Overal bed mobility: Modified Independent  Transfers Overall transfer level: Needs assistance Equipment used: Rolling walker (2 wheeled) Transfers: Sit to/from Stand Sit to Stand: Supervision   General transfer comment: Supervision for safety. Vitals WNL during transfers and functional ambulation using RW.     Balance Overall balance assessment: Needs assistance Sitting-balance support: No upper extremity supported;Feet supported Sitting balance-Leahy Scale: Good     Standing balance support: No upper extremity supported;During functional activity Standing balance-Leahy Scale: Fair Standing balance comment: Pt standing at sink to brush teeth and wash face with no UE  support    ADL Overall ADL's : Needs assistance/impaired Eating/Feeding: Set up;Sitting   Grooming: Set up;Standing   Upper Body Bathing: Set up;Sitting   Lower Body Bathing: Supervison/ safety;Sit to/from stand   Upper Body Dressing : Set up;Sitting   Lower Body Dressing: Supervision/safety;With adaptive equipment;Sit to/from stand   Toilet Transfer: Designer, industrial/product Details (indicate cue type and reason): simulated  Toileting- Clothing Manipulation and Hygiene: Supervision/safety;Sit to/from stand     Tub/Shower Transfer Details (indicate cue type and reason): Pt reports normally sitting in tub for bathing. Encouraged pt to perform showers seated on bench instead for his safety, for at least the first few showers.  Functional mobility during ADLs: Cueing for safety;Rolling walker;Supervision/safety General ADL Comments: Pt's vitals WNL entire session (HR, RR, 02). Administerred LH sponge and LH shoe horn for patient. Encouraged him to use reacher for picking up light objects off floor. Also went over energy conservation educational handout with patient.            Cognition   Behavior During Therapy: WFL for tasks assessed/performed Overall Cognitive Status: Within Functional Limits for tasks assessed                Pertinent Vitals/ Pain       Pain Assessment: No/denies pain         Frequency Min 2X/week     Progress Toward Goals  OT Goals(current goals can now be found in the care plan section)  Progress towards OT goals: Goals met and updated - see care plan  Acute Rehab OT Goals Patient Stated Goal: go home today OT Goal Formulation: With patient Time For Goal Achievement: 07/16/15 Potential to Achieve Goals: Good ADL Goals Pt Will Perform Upper Body Bathing: with modified independence;standing (at sink) Pt Will Perform  Lower Body Bathing: with modified independence;sit to/from stand;with adaptive equipment Pt Will Perform Upper  Body Dressing: Independently;sitting;standing Pt Will Perform Lower Body Dressing: with modified independence;sit to/from stand;with adaptive equipment Pt Will Transfer to Toilet: with modified independence;ambulating;regular height toilet Pt Will Perform Toileting - Clothing Manipulation and hygiene: with modified independence;sit to/from stand  Plan Discharge plan needs to be updated       End of Session Equipment Utilized During Treatment: Gait belt;Rolling walker   Activity Tolerance Patient tolerated treatment well   Patient Left in bed;with call bell/phone within reach     Time: 0940-1001 OT Time Calculation (min): 21 min  Charges: OT General Charges $OT Visit: 1 Procedure OT Treatments $Self Care/Home Management : 8-22 mins  Chrys Racer , MS, OTR/L, CLT Pager: [phone]  07/10/2015, 10:17 AM

## 2015-07-10 NOTE — Progress Notes (Signed)
Pt being discharged home via wheelchair with family. Pt alert and oriented x4. VSS. Pt c/o no pain at this time. No signs of respiratory distress. Education complete and care plans resolved. IV removed with catheter intact and pt tolerated well. No further issues at this time. Pt to follow up with PCP. Zabdi Mis R, RN 

## 2015-07-10 NOTE — Discharge Instructions (Signed)
Follow with Primary MD Elizabeth Palau, MD in 2-3 days   Get CBC, CMP, 2 view Chest X ray checked  by Primary MD next visit.    Activity: As tolerated with Full fall precautions use walker/cane & assistance as needed   Disposition Home     Diet:   [place] your Weight same time everyday, if you gain over 2 pounds, or you develop in leg swelling, experience more shortness of breath or chest pain, call your Primary MD immediately. Follow Cardiac Low Salt Diet and 1.5 lit/day fluid restriction.   On your next visit with your primary care physician please Get Medicines reviewed and adjusted.   Please request your Prim.MD to go over all Hospital Tests and Procedure/Radiological results at the follow up, please get all Hospital records sent to your Prim MD by signing hospital release before you go home.   If you experience worsening of your admission symptoms, develop shortness of breath, life threatening emergency, suicidal or homicidal thoughts you must seek medical attention immediately by calling 911 or calling your MD immediately  if symptoms less severe.  You Must read complete instructions/literature along with all the possible adverse reactions/side effects for all the Medicines you take and that have been prescribed to you. Take any new Medicines after you have completely understood and accpet all the possible adverse reactions/side effects.   Do not drive, operating heavy machinery, perform activities at heights, swimming or participation in water activities or provide baby sitting services if your were admitted for syncope or siezures until you have seen by Primary MD or a Neurologist and advised to do so again.  Do not drive when taking Pain medications.    Do not take more than prescribed Pain, Sleep and Anxiety Medications  Special Instructions: If you have smoked or chewed Tobacco  in the last 2 yrs please stop smoking, stop any regular Alcohol  and or any  Recreational drug use.  Wear Seat belts while driving.   Please note  You were cared for by a hospitalist during your hospital stay. If you have any questions about your discharge medications or the care you received while you were in the hospital after you are discharged, you can call the unit and asked to speak with the hospitalist on call if the hospitalist that took care of you is not available. Once you are discharged, your primary care physician will handle any further medical issues. Please note that NO REFILLS for any discharge medications will be authorized once you are discharged, as it is imperative that you return to your primary care physician (or establish a relationship with a primary care physician if you do not have one) for your aftercare needs so that they can reassess your need for medications and monitor your lab values.

## 2015-07-11 LAB — RESPIRATORY VIRUS PANEL
Adenovirus: NEGATIVE
Influenza A: POSITIVE — AB
Influenza B: NEGATIVE
METAPNEUMOVIRUS: NEGATIVE
PARAINFLUENZA 1 A: NEGATIVE
PARAINFLUENZA 2 A: NEGATIVE
Parainfluenza 3: NEGATIVE
Respiratory Syncytial Virus A: NEGATIVE
Respiratory Syncytial Virus B: NEGATIVE
Rhinovirus: NEGATIVE

## 2015-07-12 LAB — CULTURE, BLOOD (ROUTINE X 2)
Culture: NO GROWTH
Culture: NO GROWTH

## 2015-07-18 ENCOUNTER — Observation Stay (HOSPITAL_COMMUNITY)
Admission: EM | Admit: 2015-07-18 | Discharge: 2015-07-21 | Disposition: A | Discharge status: Home / Self Care | Payer: Medicaid Other | Attending: Internal Medicine | Admitting: Internal Medicine

## 2015-07-18 ENCOUNTER — Emergency Department (HOSPITAL_COMMUNITY): Payer: Medicaid Other

## 2015-07-18 ENCOUNTER — Encounter (HOSPITAL_COMMUNITY): Payer: Self-pay | Admitting: Emergency Medicine

## 2015-07-18 DIAGNOSIS — Z87891 Personal history of nicotine dependence: Secondary | ICD-10-CM | POA: Diagnosis not present

## 2015-07-18 DIAGNOSIS — R0602 Shortness of breath: Secondary | ICD-10-CM | POA: Diagnosis present

## 2015-07-18 DIAGNOSIS — I5031 Acute diastolic (congestive) heart failure: Secondary | ICD-10-CM | POA: Diagnosis not present

## 2015-07-18 DIAGNOSIS — I10 Essential (primary) hypertension: Secondary | ICD-10-CM | POA: Diagnosis present

## 2015-07-18 DIAGNOSIS — R7989 Other specified abnormal findings of blood chemistry: Secondary | ICD-10-CM | POA: Diagnosis present

## 2015-07-18 DIAGNOSIS — R109 Unspecified abdominal pain: Secondary | ICD-10-CM | POA: Diagnosis not present

## 2015-07-18 DIAGNOSIS — K7030 Alcoholic cirrhosis of liver without ascites: Secondary | ICD-10-CM | POA: Diagnosis not present

## 2015-07-18 DIAGNOSIS — R0600 Dyspnea, unspecified: Secondary | ICD-10-CM | POA: Diagnosis not present

## 2015-07-18 DIAGNOSIS — N183 Chronic kidney disease, stage 3 (moderate): Secondary | ICD-10-CM | POA: Insufficient documentation

## 2015-07-18 DIAGNOSIS — Z8673 Personal history of transient ischemic attack (TIA), and cerebral infarction without residual deficits: Secondary | ICD-10-CM | POA: Insufficient documentation

## 2015-07-18 DIAGNOSIS — I130 Hypertensive heart and chronic kidney disease with heart failure and stage 1 through stage 4 chronic kidney disease, or unspecified chronic kidney disease: Secondary | ICD-10-CM | POA: Insufficient documentation

## 2015-07-18 DIAGNOSIS — N189 Chronic kidney disease, unspecified: Secondary | ICD-10-CM

## 2015-07-18 DIAGNOSIS — Z79899 Other long term (current) drug therapy: Secondary | ICD-10-CM | POA: Insufficient documentation

## 2015-07-18 DIAGNOSIS — N179 Acute kidney failure, unspecified: Secondary | ICD-10-CM | POA: Diagnosis present

## 2015-07-18 DIAGNOSIS — Z6824 Body mass index (BMI) 24.0-24.9, adult: Secondary | ICD-10-CM | POA: Insufficient documentation

## 2015-07-18 DIAGNOSIS — E860 Dehydration: Secondary | ICD-10-CM | POA: Diagnosis not present

## 2015-07-18 DIAGNOSIS — E871 Hypo-osmolality and hyponatremia: Secondary | ICD-10-CM | POA: Diagnosis present

## 2015-07-18 DIAGNOSIS — R778 Other specified abnormalities of plasma proteins: Secondary | ICD-10-CM | POA: Diagnosis present

## 2015-07-18 DIAGNOSIS — F1021 Alcohol dependence, in remission: Secondary | ICD-10-CM | POA: Diagnosis not present

## 2015-07-18 DIAGNOSIS — E440 Moderate protein-calorie malnutrition: Secondary | ICD-10-CM | POA: Diagnosis not present

## 2015-07-18 MED ORDER — ALBUTEROL SULFATE (2.5 MG/3ML) 0.083% IN NEBU
5.0000 mg | INHALATION_SOLUTION | Freq: Once | RESPIRATORY_TRACT | Status: AC
  Administered 2015-07-18: 5 mg via RESPIRATORY_TRACT
  Filled 2015-07-18: qty 6

## 2015-07-18 NOTE — ED Notes (Signed)
Pt presents with c/o shortness of breath. Pt was recently diagnosed and treated for the flu. O2 sats above 98% en route for EMS. Pt was recently also seen for cocaine use approx 2 weeks ago but has not used since then. Lung sounds clear per EMS. Vitals for EMS 116/72, CBG 123, 98%RA, temp 98.1.

## 2015-07-19 ENCOUNTER — Emergency Department (HOSPITAL_COMMUNITY): Payer: Medicaid Other

## 2015-07-19 ENCOUNTER — Observation Stay (HOSPITAL_COMMUNITY): Payer: Medicaid Other

## 2015-07-19 ENCOUNTER — Encounter (HOSPITAL_COMMUNITY): Payer: Self-pay | Admitting: Internal Medicine

## 2015-07-19 DIAGNOSIS — R109 Unspecified abdominal pain: Secondary | ICD-10-CM | POA: Diagnosis present

## 2015-07-19 DIAGNOSIS — I5030 Unspecified diastolic (congestive) heart failure: Secondary | ICD-10-CM | POA: Insufficient documentation

## 2015-07-19 DIAGNOSIS — N189 Chronic kidney disease, unspecified: Secondary | ICD-10-CM

## 2015-07-19 DIAGNOSIS — I509 Heart failure, unspecified: Secondary | ICD-10-CM | POA: Insufficient documentation

## 2015-07-19 DIAGNOSIS — I5032 Chronic diastolic (congestive) heart failure: Secondary | ICD-10-CM | POA: Insufficient documentation

## 2015-07-19 DIAGNOSIS — R7989 Other specified abnormal findings of blood chemistry: Secondary | ICD-10-CM | POA: Diagnosis not present

## 2015-07-19 DIAGNOSIS — N179 Acute kidney failure, unspecified: Secondary | ICD-10-CM | POA: Insufficient documentation

## 2015-07-19 LAB — CBC WITH DIFFERENTIAL/PLATELET
BASOS ABS: 0 10*3/uL (ref 0.0–0.1)
BASOS PCT: 0 %
BASOS PCT: 1 %
Basophils Absolute: 0 10*3/uL (ref 0.0–0.1)
EOS ABS: 0 10*3/uL (ref 0.0–0.7)
EOS ABS: 0 10*3/uL (ref 0.0–0.7)
EOS PCT: 0 %
EOS PCT: 0 %
HCT: 35.1 % — ABNORMAL LOW (ref 39.0–52.0)
HCT: 32.3 % — ABNORMAL LOW (ref 39.0–52.0)
HEMOGLOBIN: 11.5 g/dL — AB (ref 13.0–17.0)
Hemoglobin: 12.6 g/dL — ABNORMAL LOW (ref 13.0–17.0)
LYMPHS ABS: 1.3 10*3/uL (ref 0.7–4.0)
Lymphocytes Relative: 24 %
Lymphocytes Relative: 31 %
Lymphs Abs: 1.1 10*3/uL (ref 0.7–4.0)
MCH: 29.1 pg (ref 26.0–34.0)
MCH: 29 pg (ref 26.0–34.0)
MCHC: 35.9 g/dL (ref 30.0–36.0)
MCHC: 35.6 g/dL (ref 30.0–36.0)
MCV: 81.1 fL (ref 78.0–100.0)
MCV: 81.4 fL (ref 78.0–100.0)
MONO ABS: 1.2 10*3/uL — AB (ref 0.1–1.0)
Monocytes Absolute: 0.9 10*3/uL (ref 0.1–1.0)
Monocytes Relative: 25 %
Monocytes Relative: 23 %
NEUTROS PCT: 46 %
Neutro Abs: 2.4 10*3/uL (ref 1.7–7.7)
Neutro Abs: 1.9 10*3/uL (ref 1.7–7.7)
Neutrophils Relative %: 51 %
PLATELETS: 121 10*3/uL — AB (ref 150–400)
PLATELETS: 105 10*3/uL — AB (ref 150–400)
RBC: 4.33 MIL/uL (ref 4.22–5.81)
RBC: 3.97 MIL/uL — AB (ref 4.22–5.81)
RDW: 14.3 % (ref 11.5–15.5)
RDW: 14.5 % (ref 11.5–15.5)
WBC: 4.6 10*3/uL (ref 4.0–10.5)
WBC: 4.1 10*3/uL (ref 4.0–10.5)

## 2015-07-19 LAB — COMPREHENSIVE METABOLIC PANEL
ALBUMIN: 3.8 g/dL (ref 3.5–5.0)
ALK PHOS: 97 U/L (ref 38–126)
ALK PHOS: 79 U/L (ref 38–126)
ALT: 49 U/L (ref 17–63)
ALT: 44 U/L (ref 17–63)
ANION GAP: 11 (ref 5–15)
AST: 85 U/L — ABNORMAL HIGH (ref 15–41)
AST: 74 U/L — AB (ref 15–41)
Albumin: 3.2 g/dL — ABNORMAL LOW (ref 3.5–5.0)
Anion gap: 11 (ref 5–15)
BILIRUBIN TOTAL: 2.4 mg/dL — AB (ref 0.3–1.2)
BUN: 69 mg/dL — ABNORMAL HIGH (ref 6–20)
BUN: 60 mg/dL — AB (ref 6–20)
CALCIUM: 9.5 mg/dL (ref 8.9–10.3)
CALCIUM: 8.9 mg/dL (ref 8.9–10.3)
CHLORIDE: 98 mmol/L — AB (ref 101–111)
CHLORIDE: 98 mmol/L — AB (ref 101–111)
CO2: 20 mmol/L — ABNORMAL LOW (ref 22–32)
CO2: 18 mmol/L — ABNORMAL LOW (ref 22–32)
CREATININE: 3.03 mg/dL — AB (ref 0.61–1.24)
CREATININE: 2.65 mg/dL — AB (ref 0.61–1.24)
GFR, EST AFRICAN AMERICAN: 25 mL/min — AB (ref >60)
GFR, EST AFRICAN AMERICAN: 29 mL/min — AB (ref >60)
GFR, EST NON AFRICAN AMERICAN: 21 mL/min — AB (ref >60)
GFR, EST NON AFRICAN AMERICAN: 25 mL/min — AB (ref >60)
Glucose, Bld: 95 mg/dL (ref 65–99)
Glucose, Bld: 81 mg/dL (ref 65–99)
Potassium: 4.2 mmol/L (ref 3.5–5.1)
Potassium: 3.9 mmol/L (ref 3.5–5.1)
Sodium: 129 mmol/L — ABNORMAL LOW (ref 135–145)
Sodium: 127 mmol/L — ABNORMAL LOW (ref 135–145)
Total Bilirubin: 2 mg/dL — ABNORMAL HIGH (ref 0.3–1.2)
Total Protein: 8.2 g/dL — ABNORMAL HIGH (ref 6.5–8.1)
Total Protein: 7.2 g/dL (ref 6.5–8.1)

## 2015-07-19 LAB — URINALYSIS, ROUTINE W REFLEX MICROSCOPIC
BILIRUBIN URINE: NEGATIVE
GLUCOSE, UA: NEGATIVE mg/dL
HGB URINE DIPSTICK: NEGATIVE
Ketones, ur: NEGATIVE mg/dL
Leukocytes, UA: NEGATIVE
Nitrite: NEGATIVE
PROTEIN: NEGATIVE mg/dL
Specific Gravity, Urine: 1.01 (ref 1.005–1.030)
pH: 5 (ref 5.0–8.0)

## 2015-07-19 LAB — TROPONIN I
TROPONIN I: 0.06 ng/mL — AB (ref <0.031)
TROPONIN I: 0.05 ng/mL — AB (ref <0.031)
TROPONIN I: 0.05 ng/mL — AB (ref <0.031)
TROPONIN I: 0.05 ng/mL — AB (ref <0.031)
Troponin I: 0.05 ng/mL — ABNORMAL HIGH (ref <0.031)

## 2015-07-19 LAB — D-DIMER, QUANTITATIVE (NOT AT ARMC)

## 2015-07-19 LAB — RAPID URINE DRUG SCREEN, HOSP PERFORMED
Amphetamines: NOT DETECTED
BARBITURATES: NOT DETECTED
Benzodiazepines: NOT DETECTED
Cocaine: NOT DETECTED
Opiates: NOT DETECTED
Tetrahydrocannabinol: NOT DETECTED

## 2015-07-19 LAB — PROTIME-INR
INR: 1.08 (ref 0.00–1.49)
PROTHROMBIN TIME: 14.2 s (ref 11.6–15.2)

## 2015-07-19 LAB — SODIUM, URINE, RANDOM: SODIUM UR: 56 mmol/L

## 2015-07-19 LAB — BRAIN NATRIURETIC PEPTIDE: B NATRIURETIC PEPTIDE 5: 41.2 pg/mL (ref 0.0–100.0)

## 2015-07-19 LAB — AMMONIA: AMMONIA: 63 umol/L — AB (ref 9–35)

## 2015-07-19 MED ORDER — SODIUM CHLORIDE 0.9 % IV SOLN
Freq: Once | INTRAVENOUS | Status: AC
  Administered 2015-07-19: 08:00:00 via INTRAVENOUS

## 2015-07-19 MED ORDER — SODIUM CHLORIDE 0.9 % IV SOLN
Freq: Once | INTRAVENOUS | Status: AC
  Administered 2015-07-19: 01:00:00 via INTRAVENOUS

## 2015-07-19 MED ORDER — AMLODIPINE BESYLATE 10 MG PO TABS
10.0000 mg | ORAL_TABLET | Freq: Every day | ORAL | Status: DC
  Administered 2015-07-19 – 2015-07-21 (×3): 10 mg via ORAL
  Filled 2015-07-19 (×3): qty 1

## 2015-07-19 MED ORDER — BOOST / RESOURCE BREEZE PO LIQD
1.0000 | Freq: Three times a day (TID) | ORAL | Status: DC
  Administered 2015-07-19 (×2): 1 via ORAL

## 2015-07-19 MED ORDER — VITAMIN B-1 100 MG PO TABS
100.0000 mg | ORAL_TABLET | Freq: Every day | ORAL | Status: DC
  Administered 2015-07-19 – 2015-07-21 (×3): 100 mg via ORAL
  Filled 2015-07-19 (×3): qty 1

## 2015-07-19 MED ORDER — MORPHINE SULFATE (PF) 2 MG/ML IV SOLN
1.0000 mg | INTRAVENOUS | Status: DC | PRN
Start: 2015-07-19 — End: 2015-07-21

## 2015-07-19 MED ORDER — ONDANSETRON HCL 4 MG PO TABS: 4.0000 mg | ORAL_TABLET | Freq: Four times a day (QID) | ORAL | Status: DC | PRN

## 2015-07-19 MED ORDER — LACTULOSE 10 GM/15ML PO SOLN
10.0000 g | Freq: Every day | ORAL | Status: DC
  Administered 2015-07-19 – 2015-07-21 (×3): 10 g via ORAL
  Filled 2015-07-19 (×3): qty 30

## 2015-07-19 MED ORDER — ACETAMINOPHEN 325 MG PO TABS: 650.0000 mg | ORAL_TABLET | Freq: Four times a day (QID) | ORAL | Status: DC | PRN

## 2015-07-19 MED ORDER — IOHEXOL 300 MG/ML  SOLN
50.0000 mL | Freq: Once | INTRAMUSCULAR | Status: AC | PRN
  Administered 2015-07-19: 50 mL via ORAL

## 2015-07-19 MED ORDER — HYDRALAZINE HCL 50 MG PO TABS
50.0000 mg | ORAL_TABLET | Freq: Three times a day (TID) | ORAL | Status: DC
  Administered 2015-07-19 – 2015-07-21 (×7): 50 mg via ORAL
  Filled 2015-07-19 (×7): qty 1

## 2015-07-19 MED ORDER — ACETAMINOPHEN 650 MG RE SUPP: 650.0000 mg | Freq: Four times a day (QID) | RECTAL | Status: DC | PRN

## 2015-07-19 MED ORDER — FOLIC ACID 1 MG PO TABS
1.0000 mg | ORAL_TABLET | Freq: Every day | ORAL | Status: DC
  Administered 2015-07-19 – 2015-07-21 (×3): 1 mg via ORAL
  Filled 2015-07-19 (×3): qty 1

## 2015-07-19 MED ORDER — ASPIRIN 81 MG PO CHEW
324.0000 mg | CHEWABLE_TABLET | Freq: Once | ORAL | Status: AC
  Administered 2015-07-19: 324 mg via ORAL
  Filled 2015-07-19: qty 4

## 2015-07-19 MED ORDER — DEXTROSE 5 % IV SOLN
2.0000 g | Freq: Every day | INTRAVENOUS | Status: DC
  Administered 2015-07-19 – 2015-07-20 (×2): 2 g via INTRAVENOUS
  Filled 2015-07-19 (×3): qty 2

## 2015-07-19 MED ORDER — ONDANSETRON HCL 4 MG/2ML IJ SOLN: 4.0000 mg | Freq: Four times a day (QID) | INTRAMUSCULAR | Status: DC | PRN

## 2015-07-19 MED ORDER — PANTOPRAZOLE SODIUM 40 MG IV SOLR
40.0000 mg | INTRAVENOUS | Status: DC
  Administered 2015-07-19 – 2015-07-21 (×3): 40 mg via INTRAVENOUS
  Filled 2015-07-19 (×3): qty 40

## 2015-07-19 NOTE — ED Provider Notes (Signed)
CSN: [id number]     Arrival date & time 07/18/15  1710 History  By signing my name below, I, Nicole Kindred, attest that this documentation has been prepared under the direction and in the presence of Rise Patience, MD.   Electronically Signed: Nicole Kindred, ED Scribe. 07/19/2015. 5:48 AM   Chief Complaint  Patient presents with  . Shortness of Breath    HPI HPI Comments: John [name] is a 60 y.o. male with PSHx of esophagogastroduodenoscopy and PMhx of CHF, liver cirrhosis, ascites presents to the Emergency Department complaining of sudden onset, shortness of breath while walking up steps this afternoon. Pt reports associated productive cough with green phlegm. Pt also complains of sudden onset, abdominal pain, onset earlier today. Pt reports associated diarrhea x3 episodes per day, shaking, and chills. No worsening or alleviating factors noted. Pt denies chest pain, fever, or any other pertinent symptoms.   Past Medical History  Diagnosis Date  . Hypertension   . Chronic back pain   . Stroke ([place]) 1998  . Polysubstance abuse   . Optic neuropathy, left   . CHF (congestive heart failure) ([place])   . Ascites   . ETOH abuse   . Cirrhosis [facility])    Past Surgical History  Procedure Laterality Date  . None    . Esophagogastroduodenoscopy N/A 08/14/2014    Procedure: ESOPHAGOGASTRODUODENOSCOPY (EGD);  Surgeon: Ladene Artist, MD;  Location: [facility];  Service: Endoscopy;  Laterality: N/A;  . Radiology with anesthesia N/A 08/15/2014    Procedure: RADIOLOGY WITH ANESTHESIA;  Surgeon: Greggory Keen, MD;  Location: [place];  Service: Radiology;  Laterality: N/A;   Family History  Problem Relation Age of Onset  . Hypertension Mother     Living  . Kidney disease Mother   . Diabetes Mother   . Heart disease Mother   . Hypertension Brother   . Diabetes Brother   . Kidney disease Brother   . Hypertension Father     Deceased, 34  . Ulcers Father   . Hypertension Sister     Social History  Substance Use Topics  . Smoking status: Former Smoker -- 0.30 packs/day for 20 years    Types: Cigarettes  . Smokeless tobacco: Never Used     Comment: 2015  . Alcohol Use: No     Comment: occ    Review of Systems  A complete 10 system review of systems was obtained and all systems are negative except as noted in the HPI and PMH.   Allergies  Penicillins  Home Medications   Prior to Admission medications   Medication Sig Start Date End Date Taking? Authorizing Provider  amLODipine (NORVASC) 10 MG tablet Take 1 tablet (10 mg total) by mouth daily. 05/25/15  Yes Debbe Odea, MD  folic acid (FOLVITE) 1 MG tablet Take 1 tablet (1 mg total) by mouth daily. 07/10/15  Yes Thurnell Lose, MD  furosemide (LASIX) 40 MG tablet Take 1 tablet (40 mg total) by mouth daily. 07/10/15  Yes Thurnell Lose, MD  hydrALAZINE (APRESOLINE) 50 MG tablet Take 1 tablet (50 mg total) by mouth 3 (three) times daily. 05/25/15  Yes Debbe Odea, MD  lactulose (CHRONULAC) 10 GM/15ML solution Take 15 mLs (10 g total) by mouth daily. 07/10/15  Yes Thurnell Lose, MD  pantoprazole (PROTONIX) 40 MG tablet Take 1 tablet (40 mg total) by mouth daily. 07/10/15  Yes Thurnell Lose, MD  thiamine 100 MG tablet Take 1 tablet (100 mg total)  by mouth daily. 07/10/15  Yes Thurnell Lose, MD   BP 122/89 mmHg  Pulse 77  Temp(Src) 97.8 F (36.6 C) (Oral)  Resp 18  SpO2 97% Physical Exam  Constitutional: He is oriented to person, place, and time. He appears well-developed and well-nourished.  HENT:  Head: Normocephalic and atraumatic.  Eyes: EOM are normal.  Neck: Normal range of motion.  Cardiovascular: Normal rate, regular rhythm, normal heart sounds and intact distal pulses.   Pulmonary/Chest: Effort normal and breath sounds normal. No respiratory distress. He has no wheezes. He has no rales.  Abdominal: Soft. He exhibits no distension. There is tenderness. There is guarding. There is no  rebound.  Generalized abdominal TTP.   Musculoskeletal: Normal range of motion.  Neurological: He is alert and oriented to person, place, and time.  Skin: Skin is warm and dry.  Psychiatric: He has a normal mood and affect. Judgment normal.  Nursing note and vitals reviewed.   ED Course  Procedures (including critical care time) DIAGNOSTIC STUDIES: Oxygen Saturation is 97% on RA, normal by my interpretation.    COORDINATION OF CARE: 12:23 AM-Discussed treatment plan which includes CXR, EKG, and proventil with pt at bedside and pt agreed to plan.   Labs Review Labs Reviewed  URINALYSIS, ROUTINE W REFLEX MICROSCOPIC (NOT AT [facility]) - Abnormal; Notable for the following:    APPearance CLOUDY (*)    All other components within normal limits  COMPREHENSIVE METABOLIC PANEL - Abnormal; Notable for the following:    Sodium 129 (*)    Chloride 98 (*)    CO2 20 (*)    BUN 69 (*)    Creatinine, Ser 3.03 (*)    Total Protein 8.2 (*)    AST 85 (*)    Total Bilirubin 2.0 (*)    GFR calc non Af Amer 21 (*)    GFR calc Af Amer 25 (*)    All other components within normal limits  TROPONIN I - Abnormal; Notable for the following:    Troponin I 0.06 (*)    All other components within normal limits  AMMONIA - Abnormal; Notable for the following:    Ammonia 63 (*)    All other components within normal limits  CBC WITH DIFFERENTIAL/PLATELET - Abnormal; Notable for the following:    Hemoglobin 12.6 (*)    HCT 35.1 (*)    Platelets 121 (*)    Monocytes Absolute 1.2 (*)    All other components within normal limits  CULTURE, BLOOD (ROUTINE X 2)  CULTURE, BLOOD (ROUTINE X 2)  URINE CULTURE  PROTIME-INR  BRAIN NATRIURETIC PEPTIDE  CBC WITH DIFFERENTIAL/PLATELET    Imaging Review Dg Chest 2 View  07/18/2015  CLINICAL DATA:  Shortness of breath today. Recent diagnosis and treatment for flu. EXAM: CHEST  2 VIEW COMPARISON:  07/08/2015 FINDINGS: Normal heart size and pulmonary vascularity. No  focal airspace disease or consolidation in the lungs. No blunting of costophrenic angles. No pneumothorax. Mediastinal contours appear intact. Vascular coils in the left upper quadrant. IMPRESSION: No active cardiopulmonary disease. Electronically Signed   By: Lucienne Capers M.D.   On: 07/18/2015 18:42   I have personally reviewed and evaluated these images and lab results as part of my medical decision-making.   EKG Interpretation   Date/Time:  Tuesday July 18 2015 17:32:11 EDT Ventricular Rate:  76 PR Interval:  111 QRS Duration: 104 QT Interval:  444 QTC Calculation: 499 R Axis:   53 Text Interpretation:  Sinus  or ectopic atrial rhythm Atrial premature  complex Borderline short PR interval Abnormal R-wave progression, early  transition Probable left ventricular hypertrophy Abnormal T, consider  ischemia, lateral leads No significant change since last tracing Confirmed  by Kathrynn Humble, MD, Levora Werden [phone]) on 07/19/2015 12:20:32 AM      MDM   Final diagnoses:  Acute-on-chronic kidney injury ([place])  Hyponatremia  Dyspnea  Acute diastolic congestive heart failure ([place])    I personally performed the services described in this documentation, which was scribed in my presence. The recorded information has been reviewed and is accurate.  Pt comes in with cc of dib. Pt has hx of CHF. Reports exertional dyspnea. No chest pain. Exam is benign, CXR is clear - concerns for diastolic heart failure exacerbation. With the DIB, pt has a cough. 0/4 SIRs at arrival. He has a CXR that is clear. We dont think there is a CAP. ? Viral infection. Pt has no hx of PE, DVT and denies any exogenous estrogen use, long distance travels or surgery in the past 6 weeks, active cancer, recent immobilization.  Pt also c/o abd pain and some confusion, although he is alert x 3 right now. His history was not the sharpest - and i am not sure if he has a grade 1 of hepatic encephalopathy. We did a bedside US , and  really we don't have a good pocket to do a diagnostic paracentesis. Ammonia ordered. Clinical concerns for SBP is extremely low - and we will not be ordering any ceftriaxone right now.  Labs do reveal slightly worse Cr. BNP is fine, but then trop is slightly elevated. EKG is reassuring. We will give aspirin and pt will need serial trops.    Varney Biles, MD 07/19/15 [phone]

## 2015-07-19 NOTE — Progress Notes (Signed)
PHARMACY NOTE -  ceftriaxone  Pharmacy has been assisting with dosing of ceftriaxone for suspected intra-abdominal infection. Dosage remains stable at 2gm IV q24h and need for further dosage adjustment appears unlikely at present.    Will sign off at this time.  Please reconsult if a change in clinical status warrants re-evaluation of dosage.  Dia Sitter, PharmD, BCPS 07/19/2015 10:49 AM

## 2015-07-19 NOTE — H&P (Addendum)
[facility] History and Physical  Gryphon Maute [id number] DOB: 02-11-1956 DOA: 07/18/2015  Referring physician: ER physician. PCP: Elizabeth Palau, MD  Specialists: None.  Chief Complaint: Abdominal pain.  HPI: John [name] is a 60 y.o. male with history of alcoholic liver cirrhosis, hepatitis C, hypertension, chronic pain, chronic kidney disease stage III versus to the ER because of increasing abdominal pain over the last 1 week. Patient was admitted in the hospital 2 weeks ago for similar complaints. Patient states his abdominal pain is mostly in the lower abdomen with nausea and vomiting. Patient had one episode of nausea and vomiting with one episode of diarrhea. Labs revealed worsening renal function. Patient was given 1 L normal saline bolus in the ER. ER physician had done bedside sonogram which does not show any ascites to be tapped. CT abdomen and pelvis is pending. Patient is being admitted for further management. Patient also endorses mild shortness of breath chest x-ray is unremarkable. Patient is afebrile. Patient during last admission was similar complaints and at that time VQ scan was negative and echocardiogram was negative for bubble study. Troponin is mildly elevated. Denies any chest pain.   Review of Systems: As presented in the history of presenting illness, rest negative.  Past Medical History  Diagnosis Date  . Hypertension   . Chronic back pain   . Stroke ([place]) 1998  . Polysubstance abuse   . Optic neuropathy, left   . CHF (congestive heart failure) ([place])   . Ascites   . ETOH abuse   . Cirrhosis [facility])    Past Surgical History  Procedure Laterality Date  . None    . Esophagogastroduodenoscopy N/A 08/14/2014    Procedure: ESOPHAGOGASTRODUODENOSCOPY (EGD);  Surgeon: Ladene Artist, MD;  Location: [facility];  Service: Endoscopy;  Laterality: N/A;  . Radiology with anesthesia N/A 08/15/2014    Procedure: RADIOLOGY WITH ANESTHESIA;   Surgeon: Greggory Keen, MD;  Location: [place];  Service: Radiology;  Laterality: N/A;   Social History:  reports that he has quit smoking. His smoking use included Cigarettes. He has a 6 pack-year smoking history. He has never used smokeless tobacco. He reports that he does not drink alcohol or use illicit drugs. Where does patient live Home. Can patient participate in ADLs? Yes.  Allergies  Allergen Reactions  . Penicillins     Convulsions, Patient could not walk  Has patient had a PCN reaction causing immediate rash, facial/tongue/throat swelling, SOB or lightheadedness with hypotension: No Has patient had a PCN reaction causing severe rash involving mucus membranes or skin necrosis: No Has patient had a PCN reaction that required hospitalization No Has patient had a PCN reaction occurring within the last 10 years: No If all of the above answers are "NO", then may proceed with Cephalosporin use.    Family History:  Family History  Problem Relation Age of Onset  . Hypertension Mother     Living  . Kidney disease Mother   . Diabetes Mother   . Heart disease Mother   . Hypertension Brother   . Diabetes Brother   . Kidney disease Brother   . Hypertension Father     Deceased, 33  . Ulcers Father   . Hypertension Sister       Prior to Admission medications   Medication Sig Start Date End Date Taking? Authorizing Provider  amLODipine (NORVASC) 10 MG tablet Take 1 tablet (10 mg total) by mouth daily. 05/25/15  Yes Debbe Odea, MD  folic acid (FOLVITE) 1  MG tablet Take 1 tablet (1 mg total) by mouth daily. 07/10/15  Yes Thurnell Lose, MD  furosemide (LASIX) 40 MG tablet Take 1 tablet (40 mg total) by mouth daily. 07/10/15  Yes Thurnell Lose, MD  hydrALAZINE (APRESOLINE) 50 MG tablet Take 1 tablet (50 mg total) by mouth 3 (three) times daily. 05/25/15  Yes Debbe Odea, MD  lactulose (CHRONULAC) 10 GM/15ML solution Take 15 mLs (10 g total) by mouth daily. 07/10/15  Yes Thurnell Lose, MD  pantoprazole (PROTONIX) 40 MG tablet Take 1 tablet (40 mg total) by mouth daily. 07/10/15  Yes Thurnell Lose, MD  thiamine 100 MG tablet Take 1 tablet (100 mg total) by mouth daily. 07/10/15  Yes Thurnell Lose, MD    Physical Exam: Filed Vitals:   07/19/15 0300 07/19/15 0330 07/19/15 0430 07/19/15 0500  BP: 117/70 104/61 100/66 127/78  Pulse: 73   72  Temp:      TempSrc:      Resp: 18 14 15 18   SpO2: 94%   99%     General:  Moderately built and nourished.  Eyes: Anicteric no pallor.  ENT: No discharge from the use his nose or mouth.  Neck: No mass felt. No JVD appreciated.  Cardiovascular: S1-S2 heard.  Respiratory: No crepitations.  Abdomen: Soft nontender bowel sounds present.  Skin: No rash.  Musculoskeletal: No edema.  Psychiatric: Appears normal.  Neurologic: Alert awake oriented to time place and person. Moves all extremities.  Labs on Admission:  Basic Metabolic Panel:  Recent Labs Lab 07/19/15 0159  NA 129*  K 4.2  CL 98*  CO2 20*  GLUCOSE 95  BUN 69*  CREATININE 3.03*  CALCIUM 9.5   Liver Function Tests:  Recent Labs Lab 07/19/15 0159  AST 85*  ALT 49  ALKPHOS 97  BILITOT 2.0*  PROT 8.2*  ALBUMIN 3.8   No results for input(s): LIPASE, AMYLASE in the last 168 hours.  Recent Labs Lab 07/19/15 0241  AMMONIA 63*   CBC:  Recent Labs Lab 07/19/15 0159  WBC 4.6  NEUTROABS 2.4  HGB 12.6*  HCT 35.1*  MCV 81.1  PLT 121*   Cardiac Enzymes:  Recent Labs Lab 07/19/15 0159  TROPONINI 0.06*    BNP (last 3 results)  Recent Labs  10/31/14 1856 07/07/15 0410 07/19/15 0048  BNP 398.9* 65.5 41.2    ProBNP (last 3 results) No results for input(s): PROBNP in the last 8760 hours.  CBG: No results for input(s): GLUCAP in the last 168 hours.  Radiological Exams on Admission: Dg Chest 2 View  07/18/2015  CLINICAL DATA:  Shortness of breath today. Recent diagnosis and treatment for flu. EXAM: CHEST  2 VIEW  COMPARISON:  07/08/2015 FINDINGS: Normal heart size and pulmonary vascularity. No focal airspace disease or consolidation in the lungs. No blunting of costophrenic angles. No pneumothorax. Mediastinal contours appear intact. Vascular coils in the left upper quadrant. IMPRESSION: No active cardiopulmonary disease. Electronically Signed   By: Lucienne Capers M.D.   On: 07/18/2015 18:42    EKG: Independently reviewed. Normal sinus rhythm.  Assessment/Plan Principal Problem:   Abdominal pain Active Problems:   SOB (shortness of breath)   Elevated troponin   Essential hypertension   Hyponatremia   Renal failure (ARF), acute on chronic (HCC)   1. Abdominal pain with history of cirrhosis of liver - cause of the pain is unclear. CT abdomen and pelvis is pending. Until then patient will be on empiric  antibiotics for possible SBP. If CT abdomen does show ascites will need paracentesis. 2. Acute on chronic renal failure with cirrhosis of liver concerning for hepatorenal syndrome - patient on spironolactone and Lasix which has been held at this time. Patient did receive IV fluids in the ER. Will hold off further IV fluids for now. Check urine sodium. Closely follow intake output. 3. Shortness of breath - not in acute distress and not hypoxic. Chest x-ray unremarkable. Recent VQ scan has been negative. Echo bubble study was negative. Will check d-dimer. Check urine drug screen. 4. Polysubstance abuse - was positive for cocaine during last admission 2 weeks ago. Recheck UDS. 5. Elevated troponin - denies any chest pain. Cycle cardiac markers. 6. Hypertension - continue home medications. Continue amlodipine and hydralazine. 7. History of alcoholism patient states he has not had alcohol for many months now.   DVT Prophylaxis SCDs.  Code Status: Full code.  Family Communication: Discussed with patient.  Disposition Plan: Admit to inpatient.    Eldred Sooy N. [facility] Pager  [phone].  If 7PM-7AM, please contact night-coverage [email or web address] Password [facility] 07/19/2015, 6:25 AM

## 2015-07-19 NOTE — Progress Notes (Signed)
[facility]                                                                              Patient Demographics  John [name], is a 60 y.o. male, DOB - 1955/11/16, [id number]  Admit date - 07/18/2015   Admitting Physician Rise Patience, MD  Outpatient Primary MD for the patient is Elizabeth Palau, MD  LOS -    Chief Complaint  Patient presents with  . Shortness of Breath      HPI on 07/19/2015 by Dr. Gean Birchwood John [name] is a 60 y.o. male with history of alcoholic liver cirrhosis, hepatitis C, hypertension, chronic pain, chronic kidney disease stage III versus to the ER because of increasing abdominal pain over the last 1 week. Patient was admitted in the hospital 2 weeks ago for similar complaints. Patient states his abdominal pain is mostly in the lower abdomen with nausea and vomiting. Patient had one episode of nausea and vomiting with one episode of diarrhea. Labs revealed worsening renal function. Patient was given 1 L normal saline bolus in the ER. ER physician had done bedside sonogram which does not show any ascites to be tapped. CT abdomen and pelvis is pending. Patient is being admitted for further management. Patient also endorses mild shortness of breath chest x-ray is unremarkable. Patient is afebrile. Patient during last admission was similar complaints and at that time VQ scan was negative and echocardiogram was negative for bubble study. Troponin is mildly elevated. Denies any chest pain.   Assessment & Plan   Patient admitted earlier today by Dr. Gean Birchwood.  See H&P for details. Agree with assessment and plan.  Abdominal pain with history of alcoholic liver cirrhosis -Cause abdominal pain unclear at this point -CT abdomen/pelvis: Nodular contour liver consistent with cirrhosis, no intrahepatic biliary ductal dilatation. Mild distended gallbladder without calcified gallstones, no nephrolithiasis or hydronephrosis or hydroureter,  no pericecal inflammation, normal appendix, no bowel obstruction, no colitis or diverticulitis, no ascites or free air -Blood and urine cultures pending -Patient placed on ceftriaxone empirically for questionable SBP- however no ascites noted on CT abd/pelvis.  -Patient currently afebrile and no leukocytosis  Acute on chronic renal failure, stage III -Baseline creatinine approximately 2.5 -Upon admission, creatinine 3.03 -Lasix and spironolactone held -We'll continue to monitor closely  Dyspnea -Likely secondary to the above -Chest x-ray unremarkable -Recent VQ scan negative for PE -Recent echocardiogram with bubble study was also negative -D-dimer <0.27 -UDS negative  Polysubstance abuse -Patient was positive for cocaine 2 weeks ago -Repeat UDS negative  Elevated troponin -Troponin appears to be flat, continue to cycle -Denies chest pain Hypertension -Continue amlodipine, hydralazine  History of alcoholism -Per patient, he has not had any alcohol use the last several months  Code Status: Full  Family Communication: None at bedside  Disposition Plan: Admitted for observation.  Expect discharge within 24 hours  Time Spent in minutes   30 minutes  Procedures  None  Consults   None  DVT Prophylaxis  SCDs  John [name] D.O. on 07/19/2015 at 12:16 PM  Between 7am to 7pm - Pager - [phone]  After 7pm go to [email or web address] - password [email or web address]  And look  for the night coverage person covering for me after hours  [facility] Office  [phone]

## 2015-07-19 NOTE — Progress Notes (Signed)
Initial Nutrition Assessment  DOCUMENTATION CODES:   Non-severe (moderate) malnutrition in context of chronic illness  INTERVENTION:   -Provide Boost Breeze po TID, each supplement provides 250 kcal and 9 grams of protein -RD to continue to monitor  NUTRITION DIAGNOSIS:   Increased nutrient needs related to other (see comment) (ETOH cirrhosis) as evidenced by estimated needs.  GOAL:   Patient will meet greater than or equal to 90% of their needs  MONITOR:   PO intake, Supplement acceptance, Labs, Weight trends, I & O's, Skin  REASON FOR ASSESSMENT:   Malnutrition Screening Tool    ASSESSMENT:   60 y.o. male with history of alcoholic liver cirrhosis, hepatitis C, hypertension, chronic pain, chronic kidney disease stage III versus to the ER because of increasing abdominal pain over the last 1 week. Patient was admitted in the hospital 2 weeks ago for similar complaints. Patient states his abdominal pain is mostly in the lower abdomen with nausea and vomiting. Patient had one episode of nausea and vomiting with one episode of diarrhea. Labs revealed worsening renal function.  Patient recently admitted and assessed by RD last week. Pt with history of polysubstance abuse (cocaine, ETOH), tested negative for this admission. Pt with no ascites. Pt c/o abdominal pain and had N/V/diarrhea PTA (at least 3 instances a day). Pt currently on clear liquids. Patient consumes 2 meals a day at home. While on clear liquids, RD to order Boost Breeze TID.  Per weight history, pt has lost 7 lb since last admission 3/10 (4% wt loss x 11 days, significant for time frame). Nutrition-Focused physical exam completed. Findings are no fat depletion, mild-moderate muscle depletion, and mild edema.   Medications: folic acid tablet daily, thiamine tablet daily Labs reviewed: Low Na  Diet Order:  Diet clear liquid Room service appropriate?: Yes; Fluid consistency:: Thin  Skin:  Wound (see comment) (thigh  wound)  Last BM:  3/13  Height:   Ht Readings from Last 1 Encounters:  07/19/15 5\' 11"  (1.803 m)    Weight:   Wt Readings from Last 1 Encounters:  07/19/15 173 lb 3.2 oz (78.563 kg)    Ideal Body Weight:  78.2 kg  BMI:  Body mass index is 24.17 kg/(m^2).  Estimated Nutritional Needs:   Kcal:  2200-2400  Protein:  115-125g  Fluid:  2.2-2.4L/day  EDUCATION NEEDS:   No education needs identified at this time  Clayton Bibles, MS, RD, LDN Pager: [phone] After Hours Pager: [phone]

## 2015-07-19 NOTE — Progress Notes (Signed)
Pharmacy Antibiotic Note  John [name] is a 60 y.o. male admitted on 07/18/2015 with Intra-abdominal infection.  Pharmacy has been consulted for Ceftriaxone dosing.  Plan: Ceftriaxone 2gm iv q24hr     Temp (24hrs), Avg:97.8 F (36.6 C), Min:97.8 F (36.6 C), Max:97.8 F (36.6 C)   Recent Labs Lab 07/19/15 0159  WBC 4.6  CREATININE 3.03*    Estimated Creatinine Clearance: 28.3 mL/min (by C-G formula based on Cr of 3.03).    Allergies  Allergen Reactions  . Penicillins     Convulsions, Patient could not walk  Has patient had a PCN reaction causing immediate rash, facial/tongue/throat swelling, SOB or lightheadedness with hypotension: No Has patient had a PCN reaction causing severe rash involving mucus membranes or skin necrosis: No Has patient had a PCN reaction that required hospitalization No Has patient had a PCN reaction occurring within the last 10 years: No If all of the above answers are "NO", then may proceed with Cephalosporin use.    Antimicrobials this admission: Ceftriaxone 2gm iv q24hr   Microbiology results: Pending  Thank you for allowing pharmacy to be a part of this patient's care.  Nani Skillern Crowford 07/19/2015 6:29 AM

## 2015-07-20 DIAGNOSIS — N179 Acute kidney failure, unspecified: Secondary | ICD-10-CM | POA: Diagnosis not present

## 2015-07-20 DIAGNOSIS — E440 Moderate protein-calorie malnutrition: Secondary | ICD-10-CM | POA: Insufficient documentation

## 2015-07-20 DIAGNOSIS — K7460 Unspecified cirrhosis of liver: Secondary | ICD-10-CM

## 2015-07-20 DIAGNOSIS — R7989 Other specified abnormal findings of blood chemistry: Secondary | ICD-10-CM | POA: Diagnosis not present

## 2015-07-20 LAB — CBC
HEMATOCRIT: 30.1 % — AB (ref 39.0–52.0)
HEMOGLOBIN: 10.9 g/dL — AB (ref 13.0–17.0)
MCH: 28.6 pg (ref 26.0–34.0)
MCHC: 36.2 g/dL — AB (ref 30.0–36.0)
MCV: 79 fL (ref 78.0–100.0)
Platelets: 101 10*3/uL — ABNORMAL LOW (ref 150–400)
RBC: 3.81 MIL/uL — ABNORMAL LOW (ref 4.22–5.81)
RDW: 14.6 % (ref 11.5–15.5)
WBC: 3.8 10*3/uL — AB (ref 4.0–10.5)

## 2015-07-20 LAB — COMPREHENSIVE METABOLIC PANEL
ALBUMIN: 3 g/dL — AB (ref 3.5–5.0)
ALT: 42 U/L (ref 17–63)
ANION GAP: 7 (ref 5–15)
AST: 68 U/L — AB (ref 15–41)
Alkaline Phosphatase: 72 U/L (ref 38–126)
BILIRUBIN TOTAL: 1.4 mg/dL — AB (ref 0.3–1.2)
BUN: 55 mg/dL — AB (ref 6–20)
CHLORIDE: 101 mmol/L (ref 101–111)
CO2: 20 mmol/L — ABNORMAL LOW (ref 22–32)
Calcium: 8.6 mg/dL — ABNORMAL LOW (ref 8.9–10.3)
Creatinine, Ser: 2.4 mg/dL — ABNORMAL HIGH (ref 0.61–1.24)
GFR calc Af Amer: 33 mL/min — ABNORMAL LOW (ref >60)
GFR calc non Af Amer: 28 mL/min — ABNORMAL LOW (ref >60)
GLUCOSE: 92 mg/dL (ref 65–99)
POTASSIUM: 4.4 mmol/L (ref 3.5–5.1)
Sodium: 128 mmol/L — ABNORMAL LOW (ref 135–145)
TOTAL PROTEIN: 6.9 g/dL (ref 6.5–8.1)

## 2015-07-20 MED ORDER — SODIUM CHLORIDE 0.9 % IV SOLN: INTRAVENOUS | Status: DC

## 2015-07-20 NOTE — Progress Notes (Signed)
PROGRESS NOTE  John [name] [id number] DOB: 10-04-1955 DOA: 07/18/2015 PCP: Elizabeth Palau, MD  HPI/Recap of past 3 hours:  60 year old male with past mental history of hepatitis C, secondary alcoholic liver cirrhosis plus stage III chronic kidney disease admitted on 3/21 with worsening abdominal pain 1 week. Had had some admission a few weeks prior. In emergency room, noted to have BUN of 69 and creatinine of 3. Labs otherwise unremarkable. Patient underwent CT scan to rule out ascites. The meantime cover prophylactically for SBP. Given fluids initially in the emergency room, it then held.  Today, creatinine down to 2.4. Patient self doing okay. Abdominal pain resolved.  Assessment/Plan: Principal Problem:   Abdominal pain: No evidence of SBP. CT negative for ascites. Antibiotics discontinued Active Problems:   SOB (shortness of breath)   Elevated troponin: Secondary to renal failure   Essential hypertension   Hyponatremia: Secondary to dehydration plus in the setting of cirrhosis   Renal failure (ARF)/acute kidney injury, acute on chronic ([place]): We'll start some gentle hydration.? Baseline given that 2 months ago, creatinine 1.36. Likely some expected increase restarting diuretics. Would favor hydrating, get creatinine down lower and then discharge on 1 diuretic   Malnutrition of moderate degree: Patient meets criteria in the context of chronic illness. Seen by nutrition. Started on boost breeze 3 times a day Cirrhosis  Code Status: Full code   Family Communication: Patient declined for me to call anyone   Disposition Plan: Anticipate discharge tomorrow    Consultants:  None   Procedures:  None   Antibiotics:  IV Rocephin 3/22-3/23    Objective: BP 122/69 mmHg  Pulse 65  Temp(Src) 97.4 F (36.3 C) (Oral)  Resp 18  Ht 5\' 11"  (1.803 m)  Wt 78.563 kg (173 lb 3.2 oz)  BMI 24.17 kg/m2  SpO2 100%  Intake/Output Summary (Last 24 hours) at 07/20/15  1545 Last data filed at 07/20/15 0700  Gross per 24 hour  Intake    820 ml  Output   1300 ml  Net   -480 ml   Filed Weights   07/19/15 0714  Weight: 78.563 kg (173 lb 3.2 oz)    Exam:   General:  Alert and oriented 3, no acute distress   Cardiovascular: Regular rate and rhythm, S1-S2   Respiratory: Clear to auscultation bilaterally   Abdomen: Soft, minimal distention, nontender, hypoactive bowel sounds   Musculoskeletal: No clubbing or cyanosis or edema    Data Reviewed: Basic Metabolic Panel:  Recent Labs Lab 07/19/15 0159 07/19/15 0634 07/20/15 0443  NA 129* 127* 128*  K 4.2 3.9 4.4  CL 98* 98* 101  CO2 20* 18* 20*  GLUCOSE 95 81 92  BUN 69* 60* 55*  CREATININE 3.03* 2.65* 2.40*  CALCIUM 9.5 8.9 8.6*   Liver Function Tests:  Recent Labs Lab 07/19/15 0159 07/19/15 0634 07/20/15 0443  AST 85* 74* 68*  ALT 49 44 42  ALKPHOS 97 79 72  BILITOT 2.0* 2.4* 1.4*  PROT 8.2* 7.2 6.9  ALBUMIN 3.8 3.2* 3.0*   No results for input(s): LIPASE, AMYLASE in the last 168 hours.  Recent Labs Lab 07/19/15 0241  AMMONIA 63*   CBC:  Recent Labs Lab 07/19/15 0159 07/19/15 0634 07/20/15 0443  WBC 4.6 4.1 3.8*  NEUTROABS 2.4 1.9  --   HGB 12.6* 11.5* 10.9*  HCT 35.1* 32.3* 30.1*  MCV 81.1 81.4 79.0  PLT 121* 105* 101*   Cardiac Enzymes:    Recent Labs Lab 07/19/15 0159  07/19/15 0601 07/19/15 0634 07/19/15 1306 07/19/15 1817  TROPONINI 0.06* 0.05* 0.05* 0.05* 0.05*   BNP (last 3 results)  Recent Labs  10/31/14 1856 07/07/15 0410 07/19/15 0048  BNP 398.9* 65.5 41.2    ProBNP (last 3 results) No results for input(s): PROBNP in the last 8760 hours.  CBG: No results for input(s): GLUCAP in the last 168 hours.  Recent Results (from the past 240 hour(s))  Blood culture (routine x 2)     Status: None (Preliminary result)   Collection Time: 07/19/15 12:48 AM  Result Value Ref Range Status   Specimen Description BLOOD LEFT FOREARM  Final    Special Requests IN PEDIATRIC BOTTLE [place]  Final   Culture   Final    NO GROWTH 1 DAY Performed at [facility]    Report Status PENDING  Incomplete  Blood culture (routine x 2)     Status: None (Preliminary result)   Collection Time: 07/19/15  1:15 AM  Result Value Ref Range Status   Specimen Description BLOOD RIGHT ANTECUBITAL  Final   Special Requests BOTTLES DRAWN AEROBIC AND ANAEROBIC 10ML  Final   Culture   Final    NO GROWTH 1 DAY Performed at [facility]    Report Status PENDING  Incomplete  Urine culture     Status: None (Preliminary result)   Collection Time: 07/19/15  1:24 AM  Result Value Ref Range Status   Specimen Description URINE, RANDOM  Final   Special Requests NONE  Final   Culture   Final    60,000 COLONIES/ml STAPHYLOCOCCUS SPECIES (COAGULASE NEGATIVE) Performed at [facility]    Report Status PENDING  Incomplete     Studies: No results found.  Scheduled Meds: . amLODipine  10 mg Oral Daily  . feeding supplement  1 Container Oral TID BM  . folic acid  1 mg Oral Daily  . hydrALAZINE  50 mg Oral TID  . lactulose  10 g Oral Daily  . pantoprazole (PROTONIX) IV  40 mg Intravenous Q24H  . thiamine  100 mg Oral Daily    Continuous Infusions: . sodium chloride       Time spent: 15 minutes   [facility] Pager [phone] . If 7PM-7AM, please contact night-coverage at [email or web address], password [facility] 07/20/2015, 3:45 PM

## 2015-07-20 NOTE — Evaluation (Signed)
Physical Therapy One Time Evaluation Patient Details Name: John [name] MRN: [id number] DOB: December 03, 1955 Today's Date: 07/20/2015   History of Present Illness  Pt is a 60 y.o. male with PMH of hypertension, stroke, diastolic congestive heart failure, polysubstance abuse, liver cirrhosis, GI bleeding, gastric versus, HCV, chronic back pain, chronic kidney disease-stage III, who presents with abdominal pain with history of alcoholic liver cirrhosis  Clinical Impression  Patient evaluated by Physical Therapy with no further acute PT needs identified. All education has been completed and the patient has no further questions.  Pt reports feeling better and ambulated in hallway without any complaints or difficulties. See below for any follow-up Physical Therapy or equipment needs. PT is signing off. Thank you for this referral.         Follow Up Recommendations Supervision for mobility/OOB    Equipment Recommendations  None recommended by PT    Recommendations for Other Services       Precautions / Restrictions Precautions Precautions: Fall      Mobility  Bed Mobility Overal bed mobility: Modified Independent                Transfers Overall transfer level: Needs assistance Equipment used: None Transfers: Sit to/from Stand Sit to Stand: Supervision         General transfer comment: supervision for safety  Ambulation/Gait Ambulation/Gait assistance: Supervision Ambulation Distance (Feet): 120 Feet Assistive device: None Gait Pattern/deviations: Step-through pattern;Decreased stride length;Wide base of support     General Gait Details: wide, stiff appearing gait however pt denies any changes/difficulty from his baseline, no unsteady or LOB observed, HR 89 bpm during ambulation  Stairs            Wheelchair Mobility    Modified Rankin (Stroke Patients Only)       Balance Overall balance assessment: No apparent balance deficits (not formally assessed)                                           Pertinent Vitals/Pain Pain Assessment: No/denies pain    Home Living Family/patient expects to be discharged to:: Skilled nursing facility Living Arrangements: Alone Available Help at Discharge: Personal care attendant;Available PRN/intermittently (says his PCA should be there for 2-3 hours, but she usually only stays about 30 minutes 5 days/week) Type of Home: Apartment Home Access: Elevator     Home Layout: One level Home Equipment: Cane - single point;Walker - 2 wheels;Shower seat;Adaptive equipment Additional Comments: staying with his mother since previous hospital admission    Prior Function Level of Independence: Independent         Comments: doesn't drive, [place]     Hand Dominance   Dominant Hand: Right    Extremity/Trunk Assessment               Lower Extremity Assessment: Overall WFL for tasks assessed         Communication   Communication: No difficulties  Cognition Arousal/Alertness: Awake/alert Behavior During Therapy: WFL for tasks assessed/performed Overall Cognitive Status: Within Functional Limits for tasks assessed                      General Comments      Exercises        Assessment/Plan    PT Assessment Patent does not need any further PT services  PT Diagnosis Difficulty walking  PT Problem List    PT Treatment Interventions     PT Goals (Current goals can be found in the Care Plan section) Acute Rehab PT Goals PT Goal Formulation: All assessment and education complete, DC therapy    Frequency     Barriers to discharge        Co-evaluation               End of Session   Activity Tolerance: Patient tolerated treatment well Patient left: with call bell/phone within reach;in chair;with chair alarm set      Functional Assessment Tool Used: clinical judgement Functional Limitation: Mobility: Walking and moving around Mobility:  Walking and Moving Around Current Status [id number]): At least 1 percent but less than 20 percent impaired, limited or restricted Mobility: Walking and Moving Around Goal Status [phone]): 0 percent impaired, limited or restricted Mobility: Walking and Moving Around Discharge Status [phone]): 0 percent impaired, limited or restricted    Time: [id number] PT Time Calculation (min) (ACUTE ONLY): 8 min   Charges:   PT Evaluation $PT Eval Low Complexity: 1 Procedure     PT G Codes:   PT G-Codes **NOT FOR INPATIENT CLASS** Functional Assessment Tool Used: clinical judgement Functional Limitation: Mobility: Walking and moving around Mobility: Walking and Moving Around Current Status [id number]): At least 1 percent but less than 20 percent impaired, limited or restricted Mobility: Walking and Moving Around Goal Status [phone]): 0 percent impaired, limited or restricted Mobility: Walking and Moving Around Discharge Status [phone]): 0 percent impaired, limited or restricted    Katerra Ingman,KATHrine E 07/20/2015, 11:41 AM Carmelia Bake, PT, DPT 07/20/2015 Pager: [phone]

## 2015-07-21 DIAGNOSIS — R7989 Other specified abnormal findings of blood chemistry: Secondary | ICD-10-CM

## 2015-07-21 DIAGNOSIS — I10 Essential (primary) hypertension: Secondary | ICD-10-CM | POA: Diagnosis not present

## 2015-07-21 DIAGNOSIS — N179 Acute kidney failure, unspecified: Secondary | ICD-10-CM

## 2015-07-21 DIAGNOSIS — N189 Chronic kidney disease, unspecified: Secondary | ICD-10-CM

## 2015-07-21 DIAGNOSIS — R0602 Shortness of breath: Secondary | ICD-10-CM

## 2015-07-21 DIAGNOSIS — E440 Moderate protein-calorie malnutrition: Secondary | ICD-10-CM

## 2015-07-21 DIAGNOSIS — E871 Hypo-osmolality and hyponatremia: Secondary | ICD-10-CM

## 2015-07-21 DIAGNOSIS — R109 Unspecified abdominal pain: Secondary | ICD-10-CM | POA: Diagnosis not present

## 2015-07-21 LAB — URINE CULTURE: Culture: 60000

## 2015-07-21 LAB — BASIC METABOLIC PANEL
Anion gap: 7 (ref 5–15)
BUN: 41 mg/dL — AB (ref 6–20)
CHLORIDE: 102 mmol/L (ref 101–111)
CO2: 20 mmol/L — AB (ref 22–32)
CREATININE: 1.82 mg/dL — AB (ref 0.61–1.24)
Calcium: 8.5 mg/dL — ABNORMAL LOW (ref 8.9–10.3)
GFR calc Af Amer: 46 mL/min — ABNORMAL LOW (ref >60)
GFR calc non Af Amer: 39 mL/min — ABNORMAL LOW (ref >60)
GLUCOSE: 82 mg/dL (ref 65–99)
POTASSIUM: 5.1 mmol/L (ref 3.5–5.1)
Sodium: 129 mmol/L — ABNORMAL LOW (ref 135–145)

## 2015-07-21 MED ORDER — BOOST / RESOURCE BREEZE PO LIQD: 1.0000 | Freq: Three times a day (TID) | ORAL | Status: DC

## 2015-07-21 MED ORDER — PANTOPRAZOLE SODIUM 40 MG PO TBEC: 40.0000 mg | DELAYED_RELEASE_TABLET | Freq: Every day | ORAL | Status: DC

## 2015-07-21 NOTE — Discharge Instructions (Signed)

## 2015-07-21 NOTE — Discharge Summary (Signed)
Physician Discharge Summary  John [name] [id number] DOB: 19-May-1955 DOA: 07/18/2015  PCP: Elizabeth Palau, MD  Admit date: 07/18/2015 Discharge date: 07/21/2015  Time spent: 45 minutes  Recommendations for Outpatient Follow-up:  Patient will be discharged to home.  Patient will need to follow up with primary care provider within one week of discharge, repeat BMP in one week.  Patient should continue medications as prescribed.  Patient should follow a heart healthy diet.   Discharge Diagnoses:  Principal Problem:   Abdominal pain Active Problems:   SOB (shortness of breath)   Elevated troponin   Essential hypertension   Hyponatremia   Renal failure (ARF), acute on chronic (HCC)   Malnutrition of moderate degree   Discharge Condition: Stable  Diet recommendation: heart healthy  Filed Weights   07/19/15 0714 07/21/15 0507  Weight: 78.563 kg (173 lb 3.2 oz) 79.198 kg (174 lb 9.6 oz)    History of present illness:  on 07/19/2015 by Dr. Gean Birchwood [place] John [name] is a 60 y.o. male with history of alcoholic liver cirrhosis, hepatitis C, hypertension, chronic pain, chronic kidney disease stage III versus to the ER because of increasing abdominal pain over the last 1 week. Patient was admitted in the hospital 2 weeks ago for similar complaints. Patient states his abdominal pain is mostly in the lower abdomen with nausea and vomiting. Patient had one episode of nausea and vomiting with one episode of diarrhea. Labs revealed worsening renal function. Patient was given 1 L normal saline bolus in the ER. ER physician had done bedside sonogram which does not show any ascites to be tapped. CT abdomen and pelvis is pending. Patient is being admitted for further management. Patient also endorses mild shortness of breath chest x-ray is unremarkable. Patient is afebrile. Patient during last admission was similar complaints and at that time VQ scan was negative and echocardiogram  was negative for bubble study. Troponin is mildly elevated. Denies any chest pain.    Hospital Course:  Abdominal pain with history of alcoholic liver cirrhosis -Cause abdominal pain unclear at this point, resolved -CT abdomen/pelvis: Nodular contour liver consistent with cirrhosis, no intrahepatic biliary ductal dilatation. Mild distended gallbladder without calcified gallstones, no nephrolithiasis or hydronephrosis or hydroureter, no pericecal inflammation, normal appendix, no bowel obstruction, no colitis or diverticulitis, no ascites or free air -Blood and urine cultures pending -Patient initially placed on ceftriaxone empirically for questionable SBP- however no ascites noted on CT abd/pelvis.  -Patient currently afebrile and no leukocytosis  Acute on chronic renal failure, stage III -Baseline creatinine approximately 1.7-2 -Upon admission, creatinine 3.03, currently 1.82 -Lasix and spironolactone held, given gentle IVF -Follow up with gastroenterology  Dyspnea -Likely secondary to the above -Resolved -Chest x-ray unremarkable -Recent VQ scan negative for PE -Recent echocardiogram with bubble study was also negative -D-dimer <0.27 -UDS negative  Polysubstance abuse -Patient was positive for cocaine 2 weeks ago -Repeat UDS negative  Elevated troponin -Troponin appears to be flat, continue to cycle -Denies chest pain  Hypertension -Continue amlodipine, hydralazine  History of alcoholism -Per patient, he has not had any alcohol use the last several months  Malnutrition of moderate degree -in the context of chronic illness -Continue feeding supplements -nutrition consulted  Hyponatremia -in the setting of dehydration and cirrhosis -Repeat BMP in one week  Procedures:  none  Consultations:  none  Discharge Exam: Filed Vitals:   07/20/15 2302 07/21/15 0507  BP: 104/53 107/59  Pulse: 73 68  Temp: 97.7 F (36.5 C) 98.2 F (36.8  C)  Resp: 17 18      General: Well developed, well nourished, NAD, appears stated age  HEENT: NCAT, mucous membranes moist.  Cardiovascular: S1 S2 auscultated,RRR  Respiratory: Clear to auscultation bilaterally   Abdomen: Soft, nontender, minimally distended, + bowel sounds  Extremities: warm dry without cyanosis clubbing or edema  Neuro: AAOx3, nonfocal  Psych: Normal affect and demeanor   Discharge Instructions      Discharge Instructions    Discharge instructions    Complete by:  As directed   Patient will be discharged to home.  Patient will need to follow up with primary care provider within one week of discharge, repeat BMP in one week.  Patient should continue medications as prescribed.  Patient should follow a heart healthy diet.            Medication List    TAKE these medications        amLODipine 10 MG tablet  Commonly known as:  NORVASC  Take 1 tablet (10 mg total) by mouth daily.     feeding supplement Liqd  Take 1 Container by mouth 3 (three) times daily between meals.     folic acid 1 MG tablet  Commonly known as:  FOLVITE  Take 1 tablet (1 mg total) by mouth daily.     furosemide 40 MG tablet  Commonly known as:  LASIX  Take 1 tablet (40 mg total) by mouth daily.     hydrALAZINE 50 MG tablet  Commonly known as:  APRESOLINE  Take 1 tablet (50 mg total) by mouth 3 (three) times daily.     lactulose 10 GM/15ML solution  Commonly known as:  CHRONULAC  Take 15 mLs (10 g total) by mouth daily.     pantoprazole 40 MG tablet  Commonly known as:  PROTONIX  Take 1 tablet (40 mg total) by mouth daily.     thiamine 100 MG tablet  Take 1 tablet (100 mg total) by mouth daily.       Allergies  Allergen Reactions  . Penicillins     Convulsions, Patient could not walk  Has patient had a PCN reaction causing immediate rash, facial/tongue/throat swelling, SOB or lightheadedness with hypotension: No Has patient had a PCN reaction causing severe rash involving  mucus membranes or skin necrosis: No Has patient had a PCN reaction that required hospitalization No Has patient had a PCN reaction occurring within the last 10 years: No If all of the above answers are "NO", then may proceed with Cephalosporin use.   Follow-up Information    Follow up with Elizabeth Palau, MD. Schedule an appointment as soon as possible for a visit in 1 week.   Specialty:  Family Medicine   Why:  Hospital follow up       The results of significant diagnostics from this hospitalization (including imaging, microbiology, ancillary and laboratory) are listed below for reference.    Significant Diagnostic Studies: Ct Abdomen Pelvis Wo Contrast  07/19/2015  CLINICAL DATA:  Lower abdominal pain for 1 day, diarrhea EXAM: CT ABDOMEN AND PELVIS WITHOUT CONTRAST TECHNIQUE: Multidetector CT imaging of the abdomen and pelvis was performed following the standard protocol without IV contrast. COMPARISON:  07/26/2014 FINDINGS: Lower chest: Lung bases shows mild dependent atelectasis right lower lobe posteriorly. Hepatobiliary: Again noted nodular contour of the liver consistent with cirrhosis. Study is limited without IV contrast. No intrahepatic biliary ductal dilatation. No CBD dilatation. Mild distended gallbladder without evidence of pericholecystic fluid or calcified gallstones. Pancreas:  Unenhanced pancreas shows no focal abnormality of surrounding inflammation. Spleen: Mild splenomegaly again noted. The spleen measures 13 cm in length. No focal splenic abnormality. Adrenals/Urinary Tract: No adrenal gland mass is noted. Unenhanced kidneys are symmetrical in size. No nephrolithiasis. No hydronephrosis or hydroureter. No calcified ureteral calculi are noted. No calcified calculi are noted within moderate distended urinary bladder. Bilateral distal ureter is unremarkable. Stomach/Bowel: Oral contrast material was given to the patient. There is no small bowel obstruction. No gastric outlet  obstruction. Contrast material noted within colon. Again noted the cecum location in mid abdomen about 2 umbilical region. There is no evidence of cecal volvulus or thickening of cecal wall. Normal appendix is noted in axial image 57. The terminal ileum is unremarkable. Vascular/Lymphatic: There are multiple metallic coils just medial to the stomach probable within perigastric varices. Mild atherosclerotic calcifications of distal abdominal aorta and iliac arteries are noted. Portal vein measures 1.5 cm in diameter consistent with portal hypertension. Reproductive: Prostate gland and seminal vesicles are unremarkable. Other: There is no abdominal ascites or free air. No pelvic ascites. No distal colonic obstruction. No colitis or diverticulitis. Again noted small umbilical hernia containing fat and partially anterior cecal wall without evidence of acute complication or obstruction. Minimal congested mesenteric vessels without evidence of mesenteric edema. No retroperitoneal or mesenteric adenopathy. Musculoskeletal: Sagittal images of the spine shows a can degenerative changes at L4-L5 and L5-S1 level. Facet degenerative changes L4 and L5 level again noted. Stable minimal anterolisthesis about 4 mm L5 on S1 vertebral body. No destructive bony lesions are noted within pelvis. Mild degenerative changes bilateral SI joints. IMPRESSION: 1. Again noted nodular contour of the liver consistent with cirrhosis. No intrahepatic biliary ductal dilatation. 2. Mild distended gallbladder without calcified gallstones. No pericholecystic fluid. 3. Multiple metallic coils are noted just medial to proximal stomach probable post coiling perigastric varices. 4. Again noted prominent size portal vein measures 1.5 cm in diameter consistent with portal hypertension. Mild splenomegaly. 5. No nephrolithiasis.  No hydronephrosis or hydroureter. 6. Stable small umbilical hernia without evidence of acute complication. 7. No pericecal  inflammation. The cecum is located in mid abdomen umbilical region without evidence of pericecal inflammation. Normal appendix. 8. No small bowel obstruction.  Unremarkable terminal ileum. 9. No colitis or diverticulitis. 10. No ascites or free air. Electronically Signed   By: Lahoma Crocker M.D.   On: 07/19/2015 09:29   Dg Chest 2 View  07/18/2015  CLINICAL DATA:  Shortness of breath today. Recent diagnosis and treatment for flu. EXAM: CHEST  2 VIEW COMPARISON:  07/08/2015 FINDINGS: Normal heart size and pulmonary vascularity. No focal airspace disease or consolidation in the lungs. No blunting of costophrenic angles. No pneumothorax. Mediastinal contours appear intact. Vascular coils in the left upper quadrant. IMPRESSION: No active cardiopulmonary disease. Electronically Signed   By: Lucienne Capers M.D.   On: 07/18/2015 18:42   Dg Chest 2 View  07/07/2015  CLINICAL DATA:  Shortness of breath, very weak for 2 days EXAM: CHEST  2 VIEW COMPARISON:  07/07/2015 FINDINGS: Mild cardiac enlargement is stable. The vascular pattern is normal. Lungs are clear. No pleural effusions. IMPRESSION: No active cardiopulmonary disease. Electronically Signed   By: Skipper Cliche M.D.   On: 07/07/2015 11:18   Dg Chest 2 View  07/07/2015  CLINICAL DATA:  Cough and shortness of breath for 3 days EXAM: CHEST  2 VIEW COMPARISON:  05/22/2015 FINDINGS: Cardiac enlargement. Pulmonary vascularity appears normal. Shallow inspiration with atelectasis in the lung  bases. No focal airspace disease or consolidation in the lungs. No blunting of costophrenic angles. No pneumothorax. Calcification in the left upper quadrant. IMPRESSION: Shallow inspiration with atelectasis in the lung bases. Cardiac enlargement without vascular congestion or edema. Electronically Signed   By: Lucienne Capers M.D.   On: 07/07/2015 04:52   Nm Pulmonary Perf And Vent  07/07/2015  CLINICAL DATA:  Shortness of breath, pneumonia EXAM: NUCLEAR MEDICINE  VENTILATION - PERFUSION LUNG SCAN TECHNIQUE: Ventilation images were obtained in multiple projections using inhaled aerosol Tc-60m DTPA. Perfusion images were obtained in multiple projections after intravenous injection of Tc-46m MAA. RADIOPHARMACEUTICALS:  31.1 mCi Technetium-62m DTPA aerosol inhalation and 4.1 mCi Technetium-74m MAA IV COMPARISON:  Correlate twin chest radiographs dated 07/07/2015 FINDINGS: Ventilation: No focal ventilation defect. Clumping in the central airways. Perfusion: No wedge shaped peripheral perfusion defects to suggest acute pulmonary embolism. IMPRESSION: Negative for pulmonary embolism. Electronically Signed   By: Julian Hy M.D.   On: 07/07/2015 14:55   US Abdomen Limited  07/07/2015  CLINICAL DATA:  Ascites, cirrhosis, substance abuse, hypertension, former smoker EXAM: LIMITED ABDOMEN ULTRASOUND FOR ASCITES TECHNIQUE: Limited ultrasound survey for ascites was performed in all four abdominal quadrants. COMPARISON:  05/23/2015 FINDINGS: No ascites identified. IMPRESSION: No ascites identified. Electronically Signed   By: Lavonia Dana M.D.   On: 07/07/2015 16:21   Dg Chest Port 1 View  07/08/2015  CLINICAL DATA:  Shortness of breath. EXAM: PORTABLE CHEST - 1 VIEW COMPARISON:  Two-view chest x-ray 07/07/2015. FINDINGS: Mild cardiac enlargement is stable. The lungs are clear. There is no edema or effusion to suggest failure. No focal airspace disease is evident. IMPRESSION: Mild cardiomegaly without failure. Electronically Signed   By: San Morelle M.D.   On: 07/08/2015 08:47    Microbiology: Recent Results (from the past 240 hour(s))  Blood culture (routine x 2)     Status: None (Preliminary result)   Collection Time: 07/19/15 12:48 AM  Result Value Ref Range Status   Specimen Description BLOOD LEFT FOREARM  Final   Special Requests IN PEDIATRIC BOTTLE 2CC  Final   Culture   Final    NO GROWTH 1 DAY Performed at [facility]    Report Status  PENDING  Incomplete  Blood culture (routine x 2)     Status: None (Preliminary result)   Collection Time: 07/19/15  1:15 AM  Result Value Ref Range Status   Specimen Description BLOOD RIGHT ANTECUBITAL  Final   Special Requests BOTTLES DRAWN AEROBIC AND ANAEROBIC 10ML  Final   Culture   Final    NO GROWTH 1 DAY Performed at [facility]    Report Status PENDING  Incomplete  Urine culture     Status: None (Preliminary result)   Collection Time: 07/19/15  1:24 AM  Result Value Ref Range Status   Specimen Description URINE, RANDOM  Final   Special Requests NONE  Final   Culture   Final    60,000 COLONIES/ml STAPHYLOCOCCUS SPECIES (COAGULASE NEGATIVE) Performed at [facility]    Report Status PENDING  Incomplete     Labs: Basic Metabolic Panel:  Recent Labs Lab 07/19/15 0159 07/19/15 0634 07/20/15 0443 07/21/15 0437  NA 129* 127* 128* 129*  K 4.2 3.9 4.4 5.1  CL 98* 98* 101 102  CO2 20* 18* 20* 20*  GLUCOSE 95 81 92 82  BUN 69* 60* 55* 41*  CREATININE 3.03* 2.65* 2.40* 1.82*  CALCIUM 9.5 8.9 8.6* 8.5*  Liver Function Tests:  Recent Labs Lab 07/19/15 0159 07/19/15 0634 07/20/15 0443  AST 85* 74* 68*  ALT 49 44 42  ALKPHOS 97 79 72  BILITOT 2.0* 2.4* 1.4*  PROT 8.2* 7.2 6.9  ALBUMIN 3.8 3.2* 3.0*   No results for input(s): LIPASE, AMYLASE in the last 168 hours.  Recent Labs Lab 07/19/15 0241  AMMONIA 63*   CBC:  Recent Labs Lab 07/19/15 0159 07/19/15 0634 07/20/15 0443  WBC 4.6 4.1 3.8*  NEUTROABS 2.4 1.9  --   HGB 12.6* 11.5* 10.9*  HCT 35.1* 32.3* 30.1*  MCV 81.1 81.4 79.0  PLT 121* 105* 101*   Cardiac Enzymes:  Recent Labs Lab 07/19/15 0159 07/19/15 0601 07/19/15 0634 07/19/15 1306 07/19/15 1817  TROPONINI 0.06* 0.05* 0.05* 0.05* 0.05*   BNP: BNP (last 3 results)  Recent Labs  10/31/14 1856 07/07/15 0410 07/19/15 0048  BNP 398.9* 65.5 41.2    ProBNP (last 3 results) No results for input(s): PROBNP in the  last 8760 hours.  CBG: No results for input(s): GLUCAP in the last 168 hours.     SignedCristal Ford  [facility] 07/21/2015, 10:15 AM

## 2015-07-24 LAB — CULTURE, BLOOD (ROUTINE X 2)
CULTURE: NO GROWTH
CULTURE: NO GROWTH

## 2015-10-25 ENCOUNTER — Other Ambulatory Visit: Payer: Self-pay | Admitting: Internal Medicine

## 2015-12-20 ENCOUNTER — Other Ambulatory Visit: Payer: Self-pay | Admitting: Nurse Practitioner

## 2015-12-20 DIAGNOSIS — K7469 Other cirrhosis of liver: Secondary | ICD-10-CM

## 2016-01-02 ENCOUNTER — Other Ambulatory Visit: Payer: Self-pay | Admitting: Nurse Practitioner

## 2016-01-02 DIAGNOSIS — B182 Chronic viral hepatitis C: Secondary | ICD-10-CM

## 2016-01-18 ENCOUNTER — Ambulatory Visit
Admission: RE | Admit: 2016-01-18 | Discharge: 2016-01-18 | Disposition: A | Discharge status: Home / Self Care | Payer: Medicaid Other | Source: Ambulatory Visit | Attending: Nurse Practitioner | Admitting: Nurse Practitioner

## 2016-01-18 DIAGNOSIS — B182 Chronic viral hepatitis C: Secondary | ICD-10-CM

## 2016-01-18 MED ORDER — IOHEXOL 300 MG/ML  SOLN
30.0000 mL | Freq: Once | INTRAMUSCULAR | Status: AC | PRN
  Administered 2016-01-18: 30 mL via ORAL

## 2016-01-18 MED ORDER — IOPAMIDOL (ISOVUE-300) INJECTION 61%
50.0000 mL | Freq: Once | INTRAVENOUS | Status: AC | PRN
  Administered 2016-01-18: 50 mL via INTRAVENOUS

## 2016-03-05 ENCOUNTER — Other Ambulatory Visit (HOSPITAL_COMMUNITY): Payer: Self-pay | Admitting: Nurse Practitioner

## 2016-03-05 DIAGNOSIS — R772 Abnormality of alphafetoprotein: Secondary | ICD-10-CM

## 2016-03-05 DIAGNOSIS — R7989 Other specified abnormal findings of blood chemistry: Secondary | ICD-10-CM

## 2016-03-05 DIAGNOSIS — R945 Abnormal results of liver function studies: Principal | ICD-10-CM

## 2016-03-05 DIAGNOSIS — B182 Chronic viral hepatitis C: Secondary | ICD-10-CM

## 2016-03-13 ENCOUNTER — Other Ambulatory Visit: Payer: Self-pay | Admitting: Radiology

## 2016-03-14 ENCOUNTER — Other Ambulatory Visit: Payer: Self-pay | Admitting: Radiology

## 2016-03-15 ENCOUNTER — Encounter (HOSPITAL_COMMUNITY): Payer: Self-pay

## 2016-03-15 ENCOUNTER — Ambulatory Visit (HOSPITAL_COMMUNITY)
Admission: RE | Admit: 2016-03-15 | Discharge: 2016-03-15 | Disposition: A | Discharge status: Home / Self Care | Payer: Medicaid Other | Source: Ambulatory Visit | Attending: Nurse Practitioner | Admitting: Nurse Practitioner

## 2016-03-15 ENCOUNTER — Other Ambulatory Visit: Payer: Self-pay | Admitting: Radiology

## 2016-03-15 DIAGNOSIS — Z87891 Personal history of nicotine dependence: Secondary | ICD-10-CM | POA: Insufficient documentation

## 2016-03-15 DIAGNOSIS — R7989 Other specified abnormal findings of blood chemistry: Secondary | ICD-10-CM

## 2016-03-15 DIAGNOSIS — R772 Abnormality of alphafetoprotein: Secondary | ICD-10-CM

## 2016-03-15 DIAGNOSIS — F1010 Alcohol abuse, uncomplicated: Secondary | ICD-10-CM | POA: Insufficient documentation

## 2016-03-15 DIAGNOSIS — Z833 Family history of diabetes mellitus: Secondary | ICD-10-CM | POA: Diagnosis not present

## 2016-03-15 DIAGNOSIS — M545 Low back pain: Secondary | ICD-10-CM | POA: Insufficient documentation

## 2016-03-15 DIAGNOSIS — Z8249 Family history of ischemic heart disease and other diseases of the circulatory system: Secondary | ICD-10-CM | POA: Diagnosis not present

## 2016-03-15 DIAGNOSIS — R945 Abnormal results of liver function studies: Secondary | ICD-10-CM

## 2016-03-15 DIAGNOSIS — I509 Heart failure, unspecified: Secondary | ICD-10-CM | POA: Insufficient documentation

## 2016-03-15 DIAGNOSIS — G8929 Other chronic pain: Secondary | ICD-10-CM | POA: Insufficient documentation

## 2016-03-15 DIAGNOSIS — I110 Hypertensive heart disease with heart failure: Secondary | ICD-10-CM | POA: Insufficient documentation

## 2016-03-15 DIAGNOSIS — B182 Chronic viral hepatitis C: Secondary | ICD-10-CM

## 2016-03-15 DIAGNOSIS — K7460 Unspecified cirrhosis of liver: Secondary | ICD-10-CM | POA: Diagnosis not present

## 2016-03-15 DIAGNOSIS — Z79899 Other long term (current) drug therapy: Secondary | ICD-10-CM | POA: Insufficient documentation

## 2016-03-15 DIAGNOSIS — Z8673 Personal history of transient ischemic attack (TIA), and cerebral infarction without residual deficits: Secondary | ICD-10-CM | POA: Diagnosis not present

## 2016-03-15 DIAGNOSIS — Z880 Allergy status to penicillin: Secondary | ICD-10-CM | POA: Diagnosis not present

## 2016-03-15 LAB — COMPREHENSIVE METABOLIC PANEL
ALBUMIN: 3.3 g/dL — AB (ref 3.5–5.0)
ALT: 105 U/L — AB (ref 17–63)
AST: 199 U/L — AB (ref 15–41)
Alkaline Phosphatase: 133 U/L — ABNORMAL HIGH (ref 38–126)
Anion gap: 8 (ref 5–15)
BILIRUBIN TOTAL: 2 mg/dL — AB (ref 0.3–1.2)
BUN: 18 mg/dL (ref 6–20)
CO2: 19 mmol/L — ABNORMAL LOW (ref 22–32)
CREATININE: 1.8 mg/dL — AB (ref 0.61–1.24)
Calcium: 8.8 mg/dL — ABNORMAL LOW (ref 8.9–10.3)
Chloride: 107 mmol/L (ref 101–111)
GFR calc Af Amer: 46 mL/min — ABNORMAL LOW (ref >60)
GFR, EST NON AFRICAN AMERICAN: 40 mL/min — AB (ref >60)
GLUCOSE: 90 mg/dL (ref 65–99)
Potassium: 4.5 mmol/L (ref 3.5–5.1)
Sodium: 134 mmol/L — ABNORMAL LOW (ref 135–145)
TOTAL PROTEIN: 7.3 g/dL (ref 6.5–8.1)

## 2016-03-15 LAB — CBC WITH DIFFERENTIAL/PLATELET
BASOS ABS: 0 10*3/uL (ref 0.0–0.1)
BASOS PCT: 0 %
Eosinophils Absolute: 0 10*3/uL (ref 0.0–0.7)
Eosinophils Relative: 1 %
HEMATOCRIT: 34.1 % — AB (ref 39.0–52.0)
HEMOGLOBIN: 12.4 g/dL — AB (ref 13.0–17.0)
LYMPHS PCT: 38 %
Lymphs Abs: 1.5 10*3/uL (ref 0.7–4.0)
MCH: 30.7 pg (ref 26.0–34.0)
MCHC: 36.4 g/dL — ABNORMAL HIGH (ref 30.0–36.0)
MCV: 84.4 fL (ref 78.0–100.0)
Monocytes Absolute: 0.7 10*3/uL (ref 0.1–1.0)
Monocytes Relative: 17 %
NEUTROS PCT: 44 %
Neutro Abs: 1.7 10*3/uL (ref 1.7–7.7)
Platelets: 91 10*3/uL — ABNORMAL LOW (ref 150–400)
RBC: 4.04 MIL/uL — AB (ref 4.22–5.81)
RDW: 14.6 % (ref 11.5–15.5)
WBC: 4 10*3/uL (ref 4.0–10.5)

## 2016-03-15 LAB — PROTIME-INR
INR: 1.09
PROTHROMBIN TIME: 14.1 s (ref 11.4–15.2)

## 2016-03-15 MED ORDER — FENTANYL CITRATE (PF) 100 MCG/2ML IJ SOLN
INTRAMUSCULAR | Status: AC | PRN
  Administered 2016-03-15 (×2): 50 ug via INTRAVENOUS

## 2016-03-15 MED ORDER — MIDAZOLAM HCL 2 MG/2ML IJ SOLN
INTRAMUSCULAR | Status: AC
  Filled 2016-03-15: qty 4

## 2016-03-15 MED ORDER — MIDAZOLAM HCL 2 MG/2ML IJ SOLN
INTRAMUSCULAR | Status: AC | PRN
  Administered 2016-03-15 (×2): 1 mg via INTRAVENOUS

## 2016-03-15 MED ORDER — LIDOCAINE HCL 1 % IJ SOLN
INTRAMUSCULAR | Status: AC
  Filled 2016-03-15: qty 20

## 2016-03-15 MED ORDER — SODIUM CHLORIDE 0.9 % IV SOLN
INTRAVENOUS | Status: DC
  Administered 2016-03-15: 13:00:00 via INTRAVENOUS

## 2016-03-15 MED ORDER — HYDROCODONE-ACETAMINOPHEN 5-325 MG PO TABS
ORAL_TABLET | ORAL | Status: AC
  Filled 2016-03-15: qty 2

## 2016-03-15 MED ORDER — HYDROCODONE-ACETAMINOPHEN 5-325 MG PO TABS
1.0000 | ORAL_TABLET | ORAL | Status: DC | PRN
  Administered 2016-03-15: 2 via ORAL

## 2016-03-15 MED ORDER — FENTANYL CITRATE (PF) 100 MCG/2ML IJ SOLN
INTRAMUSCULAR | Status: AC
  Filled 2016-03-15: qty 4

## 2016-03-15 NOTE — Discharge Instructions (Signed)
Percutaneous Kidney Biopsy, Care After °This sheet gives you information about how to care for yourself after your procedure. Your health care provider may also give you more specific instructions. If you have problems or questions, contact your health care provider. °What can I expect after the procedure? °After the procedure, it is common to have: °· Pain or soreness near the area where the needle went through your skin (biopsy site). °· Bright pink or cloudy urine for 24 hours after the procedure. °Follow these instructions at home: °Activity  °· Return to your normal activities as told by your health care provider. Ask your health care provider what activities are safe for you. °· Do not drive for 24 hours if you were given a medicine to help you relax (sedative). °· Do not lift anything that is heavier than 10 lb (4.5 kg) until your health care provider tells you that it is safe. °· Avoid activities that take a lot of effort (are strenuous) until your health care provider approves. Most people will have to wait 2 weeks before returning to activities such as exercise or sexual intercourse. °General instructions  °· Take over-the-counter and prescription medicines only as told by your health care provider. °· You may eat and drink after your procedure. Follow instructions from your health care provider about eating or drinking restrictions. °· Check your biopsy site every day for signs of infection. Check for: °¨ More redness, swelling, or pain. °¨ More fluid or blood. °¨ Warmth. °¨ Pus or a bad smell. °· Keep all follow-up visits as told by your health care provider. This is important. °Contact a health care provider if: °· You have more redness, swelling, or pain around your biopsy site. °· You have more fluid or blood coming from your biopsy site. °· Your biopsy site feels warm to the touch. °· You have pus or a bad smell coming from your biopsy site. °· You have blood in your urine more than 24 hours after  your procedure. °Get help right away if: °· You have dark red or brown urine. °· You have a fever. °· You are unable to urinate. °· You feel burning when you urinate. °· You feel faint. °· You have severe pain in your abdomen or side. °This information is not intended to replace advice given to you by your health care provider. Make sure you discuss any questions you have with your health care provider. °Document Released: 12/16/2012 Document Revised: 01/26/2016 Document Reviewed: 01/26/2016 °Elsevier Interactive Patient Education © 2017 [employer] ° °

## 2016-03-15 NOTE — H&P (Signed)
Chief Complaint: Patient was seen in consultation today for random liver biopsy at the request of Drazek,Dawn  Referring Physician(s): Drazek,Dawn  Supervising Physician: Arne Cleveland  Patient Status: [facility] - Out-pt  History of Present Illness: John [name] is a 60 y.o. male   Hx Hep C Hx Alcoholic Cirrhosis--(no drinking over 2 yrs) Follows at Hep C center Elevation in LFTs Scheduled now for random liver biopsy  Past Medical History:  Diagnosis Date  . Ascites   . CHF (congestive heart failure) ([place])   . Chronic back pain   . Cirrhosis ([place])   . ETOH abuse   . Hypertension   . Optic neuropathy, left   . Polysubstance abuse   . Stroke [facility]) 1998    Past Surgical History:  Procedure Laterality Date  . ESOPHAGOGASTRODUODENOSCOPY N/A 08/14/2014   Procedure: ESOPHAGOGASTRODUODENOSCOPY (EGD);  Surgeon: Ladene Artist, MD;  Location: [facility];  Service: Endoscopy;  Laterality: N/A;  . none    . RADIOLOGY WITH ANESTHESIA N/A 08/15/2014   Procedure: RADIOLOGY WITH ANESTHESIA;  Surgeon: Greggory Keen, MD;  Location: [facility];  Service: Radiology;  Laterality: N/A;    Allergies: Penicillins  Medications: Prior to Admission medications   Medication Sig Start Date End Date Taking? Authorizing Provider  amLODipine (NORVASC) 10 MG tablet Take 1 tablet (10 mg total) by mouth daily. 05/25/15  Yes Debbe Odea, MD  hydrALAZINE (APRESOLINE) 50 MG tablet Take 1 tablet (50 mg total) by mouth 3 (three) times daily. 05/25/15  Yes Debbe Odea, MD  spironolactone (ALDACTONE) 25 MG tablet Take 25 mg by mouth 2 (two) times daily.   Yes Historical Provider, MD     Family History  Problem Relation Age of Onset  . Hypertension Mother     Living  . Kidney disease Mother   . Diabetes Mother   . Heart disease Mother   . Hypertension Brother   . Diabetes Brother   . Kidney disease Brother   . Hypertension Father     Deceased, 62  . Ulcers Father   . Hypertension Sister      Social History   Social History  . Marital status: Single    Spouse name: N/A  . Number of children: N/A  . Years of education: N/A   Social History Main Topics  . Smoking status: Former Smoker    Packs/day: 0.30    Years: 20.00    Types: Cigarettes  . Smokeless tobacco: Never Used     Comment: 2015  . Alcohol use No     Comment: occ  . Drug use: No     Comment: since fall 2015  . Sexual activity: Not Asked   Other Topics Concern  . None   Social History Narrative   Lives with niece in a 2 story home.     On disability since 2015 for low back pain.  Used to work Statistician.    Highest level of education: 11th grade     Review of Systems: A 12 point ROS discussed and pertinent positives are indicated in the HPI above.  All other systems are negative.  Review of Systems  Constitutional: Negative for activity change, appetite change, fatigue and fever.  Respiratory: Negative for cough and shortness of breath.   Cardiovascular: Negative for chest pain.  Gastrointestinal: Negative for abdominal distention and abdominal pain.  Musculoskeletal: Negative for back pain.  Psychiatric/Behavioral: Negative for behavioral problems and confusion.    Vital Signs: BP 132/85  Pulse 64   Temp 97.9 F (36.6 C)   Resp 18   Ht 5\' 8"  (1.727 m)   Wt 200 lb (90.7 kg)   SpO2 99%   BMI 30.41 kg/m   Physical Exam  Constitutional: He is oriented to person, place, and time.  Cardiovascular: Normal rate, regular rhythm and normal heart sounds.   Pulmonary/Chest: Effort normal and breath sounds normal.  Abdominal: Soft. Bowel sounds are normal. He exhibits no distension. There is no tenderness.  Musculoskeletal: Normal range of motion.  Neurological: He is alert and oriented to person, place, and time.  Skin: Skin is warm and dry.  Psychiatric: He has a normal mood and affect. His behavior is normal. Judgment and thought content normal.  Nursing note and vitals  reviewed.   Mallampati Score:  MD Evaluation Airway: WNL Heart: WNL Abdomen: WNL Chest/ Lungs: WNL ASA  Classification: 3 Mallampati/Airway Score: One  Imaging: No results found.  Labs:  CBC:  Recent Labs  07/19/15 0159 07/19/15 0634 07/20/15 0443 03/15/16 1211  WBC 4.6 4.1 3.8* 4.0  HGB 12.6* 11.5* 10.9* 12.4*  HCT 35.1* 32.3* 30.1* 34.1*  PLT 121* 105* 101* PENDING    COAGS:  Recent Labs  05/22/15 1838 07/07/15 0619 07/07/15 0731 07/19/15 0048 03/15/16 1211  INR 1.25 1.19  --  1.08 1.09  APTT  --   --  31  --   --     BMP:  Recent Labs  07/19/15 0634 07/20/15 0443 07/21/15 0437 03/15/16 1211  NA 127* 128* 129* 134*  K 3.9 4.4 5.1 4.5  CL 98* 101 102 107  CO2 18* 20* 20* 19*  GLUCOSE 81 92 82 90  BUN 60* 55* 41* 18  CALCIUM 8.9 8.6* 8.5* 8.8*  CREATININE 2.65* 2.40* 1.82* 1.80*  GFRNONAA 25* 28* 39* 40*  GFRAA 29* 33* 46* 46*    LIVER FUNCTION TESTS:  Recent Labs  07/19/15 0159 07/19/15 0634 07/20/15 0443 03/15/16 1211  BILITOT 2.0* 2.4* 1.4* 2.0*  AST 85* 74* 68* 199*  ALT 49 44 42 105*  ALKPHOS 97 79 72 133*  PROT 8.2* 7.2 6.9 7.3  ALBUMIN 3.8 3.2* 3.0* 3.3*    TUMOR MARKERS: No results for input(s): AFPTM, CEA, CA199, CHROMGRNA in the last 8760 hours.  Assessment and Plan:  Hx Hep C and Cirrhosis Recent elevation of LFTs Now scheduled for random liver bx Risks and Benefits discussed with the patient including, but not limited to bleeding, infection, damage to adjacent structures or low yield requiring additional tests. All of the patient's questions were answered, patient is agreeable to proceed. Consent signed and in chart.   Thank you for this interesting consult.  I greatly enjoyed meeting John [name] and look forward to participating in their care.  A copy of this report was sent to the requesting provider on this date.  Electronically Signed: Covey Baller A 03/15/2016, 1:10 PM   I spent a total of  30  Minutes   in face to face in clinical consultation, greater than 50% of which was counseling/coordinating care for random liver biopsy

## 2016-03-15 NOTE — Procedures (Signed)
US liver core 18g x3 to surg path No complication No blood loss. See complete dictation in [facility] PACS.  

## 2016-03-15 NOTE — Sedation Documentation (Signed)
Patient is resting comfortably. 

## 2016-03-15 NOTE — Sedation Documentation (Signed)
Patient denies pain and is resting comfortably.  

## 2016-05-30 ENCOUNTER — Other Ambulatory Visit: Payer: Self-pay | Admitting: Nurse Practitioner

## 2016-05-30 DIAGNOSIS — B182 Chronic viral hepatitis C: Secondary | ICD-10-CM

## 2016-05-30 DIAGNOSIS — K7460 Unspecified cirrhosis of liver: Principal | ICD-10-CM

## 2016-06-05 ENCOUNTER — Other Ambulatory Visit: Payer: Medicaid Other

## 2016-06-13 ENCOUNTER — Ambulatory Visit
Admission: RE | Admit: 2016-06-13 | Discharge: 2016-06-13 | Disposition: A | Discharge status: Home / Self Care | Payer: Medicaid Other | Source: Ambulatory Visit | Attending: Nurse Practitioner | Admitting: Nurse Practitioner

## 2016-06-13 DIAGNOSIS — B182 Chronic viral hepatitis C: Secondary | ICD-10-CM

## 2016-06-13 DIAGNOSIS — K7460 Unspecified cirrhosis of liver: Principal | ICD-10-CM

## 2016-06-13 MED ORDER — IOPAMIDOL (ISOVUE-300) INJECTION 61%
125.0000 mL | Freq: Once | INTRAVENOUS | Status: AC | PRN
  Administered 2016-06-13: 125 mL via INTRAVENOUS

## 2016-06-18 ENCOUNTER — Other Ambulatory Visit: Payer: Self-pay | Admitting: Nurse Practitioner

## 2016-06-18 DIAGNOSIS — K7469 Other cirrhosis of liver: Secondary | ICD-10-CM

## 2016-06-18 DIAGNOSIS — R772 Abnormality of alphafetoprotein: Secondary | ICD-10-CM

## 2016-06-28 ENCOUNTER — Ambulatory Visit
Admission: RE | Admit: 2016-06-28 | Discharge: 2016-06-28 | Disposition: A | Discharge status: Home / Self Care | Payer: Medicaid Other | Source: Ambulatory Visit | Attending: Nurse Practitioner | Admitting: Nurse Practitioner

## 2016-06-28 DIAGNOSIS — R772 Abnormality of alphafetoprotein: Secondary | ICD-10-CM

## 2016-06-28 DIAGNOSIS — K7469 Other cirrhosis of liver: Secondary | ICD-10-CM

## 2016-06-28 MED ORDER — GADOXETATE DISODIUM 0.25 MMOL/ML IV SOLN
10.0000 mL | Freq: Once | INTRAVENOUS | Status: AC | PRN
  Administered 2016-06-28: 10 mL via INTRAVENOUS

## 2016-08-12 ENCOUNTER — Other Ambulatory Visit: Payer: Self-pay | Admitting: Urology

## 2016-08-12 DIAGNOSIS — N289 Disorder of kidney and ureter, unspecified: Secondary | ICD-10-CM

## 2016-08-26 IMAGING — US US PARACENTESIS
1 series · 8 of 8 positions shown · non-contrast
Comparison: None.

MEDICATIONS:
10 cc 1% lidocaine

COMPLICATIONS:
None immediate

INDICATION: Ascites

EXAM:
ULTRASOUND-GUIDED PARACENTESIS
TECHNIQUE: Informed written consent was obtained from the patient after a
discussion of the risks, benefits and alternatives to treatment. A
timeout was performed prior to the initiation of the procedure.

[Series 1: us paracentesis · 0.23mm/px · 8 of 8 slices shown]
[im 1/8]
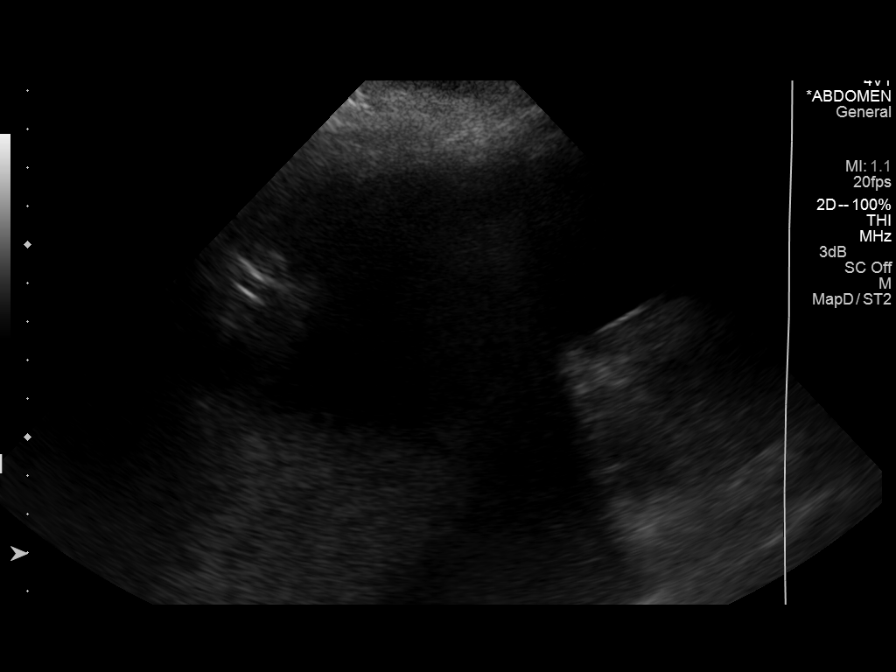
[im 2/8]
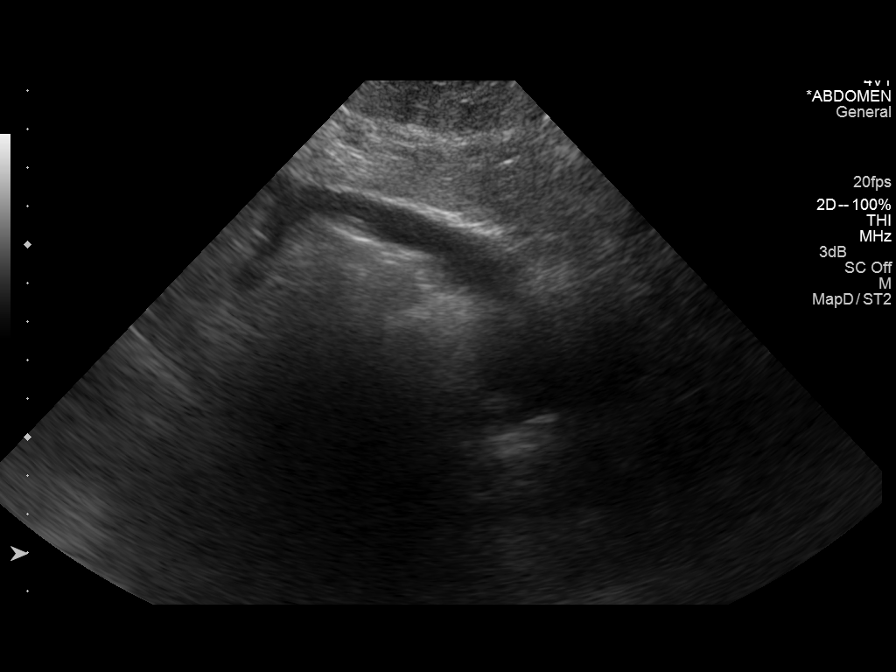
[im 3/8]
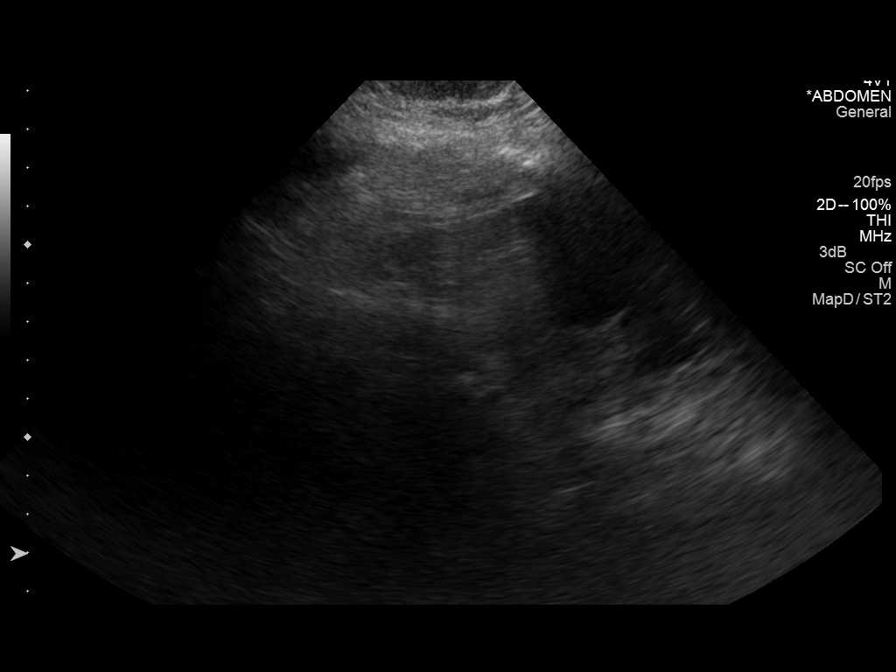
[im 4/8]
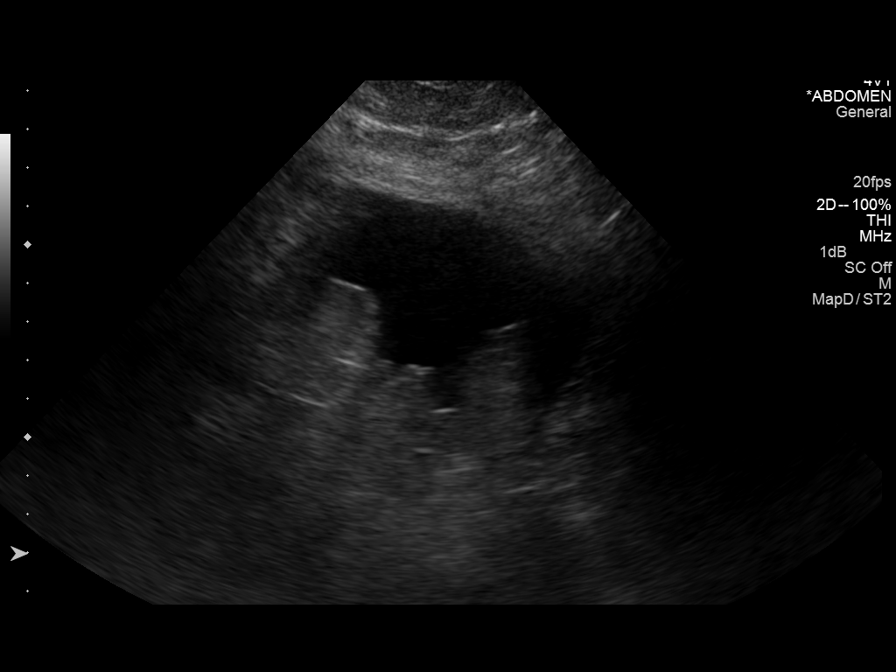
[im 5/8]
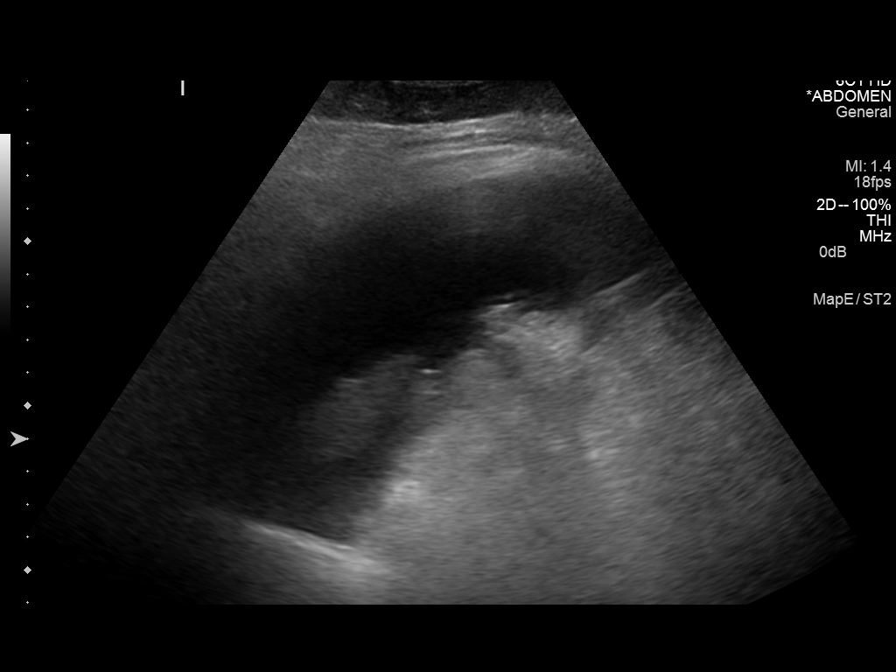
[im 6/8]
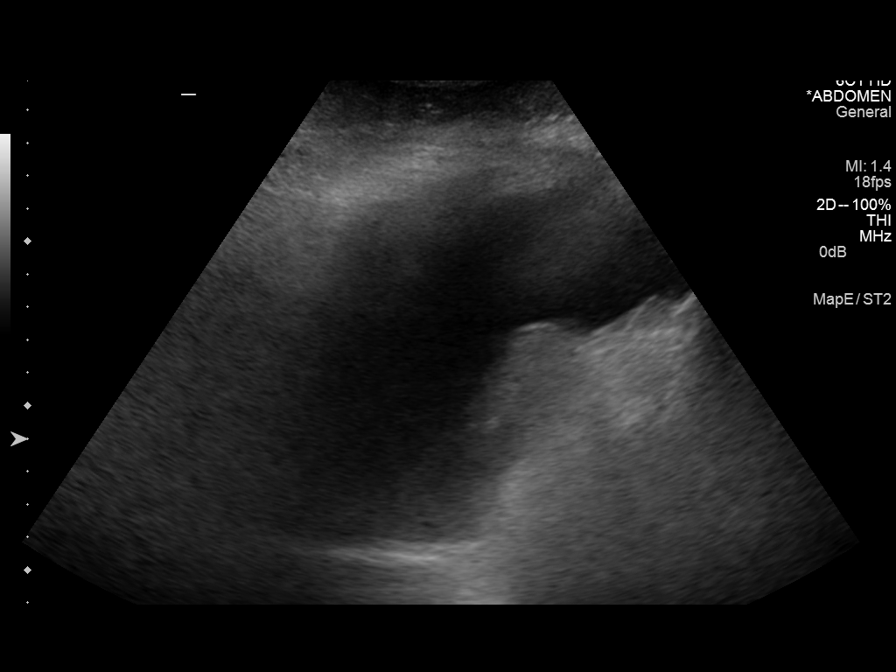
[im 7/8]
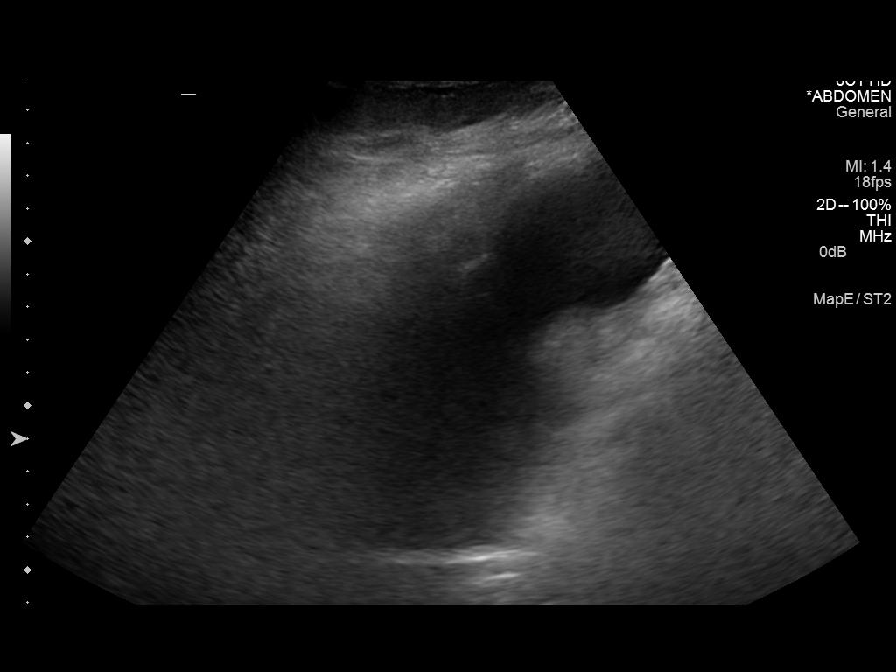
[im 8/8]
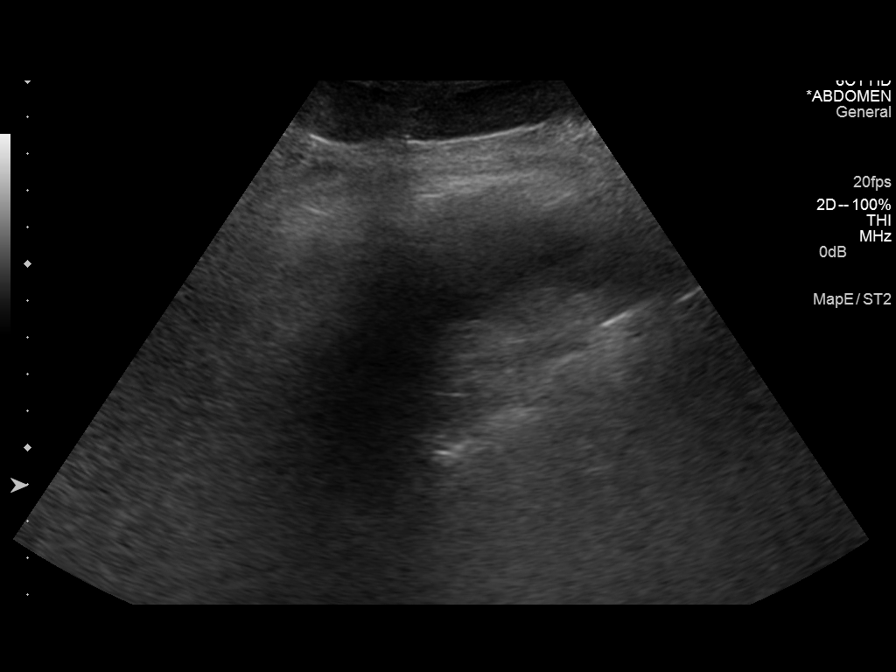

[8 of 8 positions shown; findings below may reference images not displayed]

Initial ultrasound scanning demonstrates a large amount of ascites
within the right upper abdominal quadrant. The right upper abdomen
was prepped and draped in the usual sterile fashion. 1% lidocaine
with epinephrine was used for local anesthesia. Under direct
ultrasound guidance, a 19 gauge, 7-cm, Yueh catheter was introduced.
An ultrasound image was saved for documentation purposed. the
paracentesis was performed. The catheter was removed and a dressing
was applied. The patient tolerated the procedure well without
immediate post procedural complication.
FINDINGS: A total of approximately 1.9 liters of cloudy yellow fluid was
removed. Samples were sent to the laboratory as requested by the
clinical team.
IMPRESSION: Successful ultrasound-guided paracentesis yielding 1.9 liters of
peritoneal fluid.

Read by:  Akia Torrijos

## 2016-09-25 ENCOUNTER — Ambulatory Visit
Admission: RE | Admit: 2016-09-25 | Discharge: 2016-09-25 | Disposition: A | Discharge status: Home / Self Care | Payer: Medicaid Other | Source: Ambulatory Visit | Attending: Urology | Admitting: Urology

## 2016-09-25 DIAGNOSIS — N289 Disorder of kidney and ureter, unspecified: Secondary | ICD-10-CM

## 2016-09-25 HISTORY — PX: IR RADIOLOGIST EVAL & MGMT: IMG5224

## 2016-09-25 NOTE — Consult Note (Signed)
Chief Complaint: Left renal mass measuring 1.4 cm concerning for renal cell carcinoma. Assess for cryoablation.   Referring Physician(s): Ottelin,Mark  History of Present Illness: John [name] is a 61 y.o. male with a history of chronic renal insufficiency, hepatitis C, cirrhosis, portal hypertension, and gastroesophageal varices. He is status post balloon retrograde transvenous obliteration of gastric varices in 2016 and is known to our service. He presents today as an outpatient to review an incidental solid enhancing left renal mass demonstrated by CT and MRI. He is referred by urology. His treatment options have been reviewed by urology including surgical intervention, surveillance, and cryoablation. He remains asymptomatic. No current flank or abdominal pain. No hematuria or dysuria. No fevers.  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  Past Medical History:  Diagnosis Date  . Ascites   . CHF (congestive heart failure) ([place])   . Chronic back pain   . Cirrhosis ([place])   . ETOH abuse   . Hypertension   . Optic neuropathy, left   . Polysubstance abuse   . Stroke [facility]) 1998    Past Surgical History:  Procedure Laterality Date  . ESOPHAGOGASTRODUODENOSCOPY N/A 08/14/2014   Procedure: ESOPHAGOGASTRODUODENOSCOPY (EGD);  Surgeon: Ladene Artist, MD;  Location: [facility];  Service: Endoscopy;  Laterality: N/A;  . none    . RADIOLOGY WITH ANESTHESIA N/A 08/15/2014   Procedure: RADIOLOGY WITH ANESTHESIA;  Surgeon: Greggory Keen, MD;  Location: [facility];  Service: Radiology;  Laterality: N/A;    Allergies: Penicillins  Medications: Prior to Admission medications   Medication Sig Start Date End Date Taking? Authorizing Provider  amLODipine (NORVASC) 10 MG tablet Take 1 tablet (10 mg total) by mouth daily. 05/25/15  Yes Debbe Odea, MD  hydrALAZINE (APRESOLINE) 50 MG tablet Take 1 tablet (50 mg  total) by mouth 3 (three) times daily. 05/25/15  Yes Debbe Odea, MD  spironolactone (ALDACTONE) 25 MG tablet Take 25 mg by mouth 2 (two) times daily.   Yes [provider]     Family History  Problem Relation Age of Onset  . Hypertension Mother        Living  . Kidney disease Mother   . Diabetes Mother   . Heart disease Mother   . Hypertension Brother   . Diabetes Brother   . Kidney disease Brother   . Hypertension Father        Deceased, 10  . Ulcers Father   . Hypertension Sister     Social History   Social History  . Marital status: Single    Spouse name: N/A  . Number of children: N/A  . Years of education: N/A   Social History Main Topics  . Smoking status: Former Smoker    Packs/day: 0.30    Years: 20.00    Types: Cigarettes  . Smokeless tobacco: Never Used     Comment: 2015  . Alcohol use No     Comment: occ  . Drug use: No     Comment: since fall 2015  . Sexual activity: Not Asked   Other Topics Concern  . None   Social History Narrative   Lives with niece in a 2 story home.     On disability since 2015 for low back pain.  Used to work Statistician.    Highest level of education: 11th grade    ECOG Status: 0 - Asymptomatic  Review of Systems: A 12 point ROS discussed and pertinent positives are indicated in the HPI above.  All other systems are negative.  Review of Systems  Vital Signs: BP 123/80   Pulse 66   Temp 97.7 F (36.5 C) (Oral)   Resp 14   Ht 5\' 10"  (1.778 m)   Wt 215 lb (97.5 kg)   SpO2 99%   BMI 30.85 kg/m   Physical Exam  Constitutional: He is oriented to person, place, and time. He appears well-developed and well-nourished. No distress.  Eyes: Conjunctivae are normal. No scleral icterus.  Cardiovascular: Normal rate, regular rhythm and normal heart sounds.  Exam reveals no friction rub.   No murmur heard. Pulmonary/Chest: Effort normal and breath sounds normal. No respiratory distress. He has no wheezes.    Abdominal: Soft. He exhibits no distension and no mass. There is no tenderness. There is no guarding.  Back brace overlies the lower abdomen and back.  Neurological: He is oriented to person, place, and time.  Skin: He is not diaphoretic.  Psychiatric: He has a normal mood and affect. His behavior is normal.    Mallampati Score:   2  Imaging: No results found.  Labs:  CBC:  Recent Labs  03/15/16 1211  WBC 4.0  HGB 12.4*  HCT 34.1*  PLT 91*    COAGS:  Recent Labs  03/15/16 1211  INR 1.09    BMP:  Recent Labs  03/15/16 1211  NA 134*  K 4.5  CL 107  CO2 19*  GLUCOSE 90  BUN 18  CALCIUM 8.8*  CREATININE 1.80*  GFRNONAA 40*  GFRAA 46*    LIVER FUNCTION TESTS:  Recent Labs  03/15/16 1211  BILITOT 2.0*  AST 199*  ALT 105*  ALKPHOS 133*  PROT 7.3  ALBUMIN 3.3*    TUMOR MARKERS: No results for input(s): AFPTM, CEA, CA199, CHROMGRNA in the last 8760 hours.  Assessment and Plan:  1.4 cm lateral left renal cortical mass appearing solid and showing vague enhancement by MRI, suspicious for a small renal cell carcinoma. Treatment options were reviewed including surveillance and cryoablation. The ablation procedure was reviewed in detail including the procedure, risks, benefits and alternatives. He understands he would need anesthesia clearance prior to the procedure given his comorbidities. After our discussion he would like to proceed with the treatment.  Plan: He is currently a symptomatic from a cardiac standpoint. He tolerated anesthesia in 2016 for the BRTO procedure. Because of his multiple medical problems, I will have anesthesia assess him prior to the procedure and if necessary obtained cardiac clearance.  Following anesthesia evaluation, hopefully he can be scheduled in the next few weeks at [facility] long hospital electively.  Thank you for this interesting consult.  I greatly enjoyed meeting John [name] and look forward to participating in  their care.  A copy of this report was sent to the requesting provider on this date.  Electronically Signed: DELANEY, SCHNICK 09/25/2016, 2:50 PM   I spent a total of    25 Minutes in face to face in clinical consultation, greater than 50% of which was counseling/coordinating care for this patient with a left renal mass.

## 2016-10-01 ENCOUNTER — Other Ambulatory Visit (HOSPITAL_COMMUNITY): Payer: Self-pay | Admitting: Interventional Radiology

## 2016-10-01 DIAGNOSIS — N289 Disorder of kidney and ureter, unspecified: Secondary | ICD-10-CM

## 2016-10-02 ENCOUNTER — Encounter (HOSPITAL_COMMUNITY): Payer: Self-pay | Admitting: *Deleted

## 2016-10-02 ENCOUNTER — Other Ambulatory Visit: Payer: Self-pay | Admitting: Radiology

## 2016-10-11 ENCOUNTER — Encounter: Payer: Self-pay | Admitting: Interventional Radiology

## 2016-10-15 NOTE — Patient Instructions (Addendum)
John [name]  10/15/2016   Your procedure is scheduled on: 11-08-16   Report to [facility] Main  Entrance Follow Sign to Radiology on 1st Floor at 6:30 AM.    Call this number if you have problems the morning of surgery [phone]    Remember: ONLY 1 PERSON MAY GO WITH YOU TO SHORT STAY TO GET  READY MORNING OF [place].  Do not eat food or drink liquids :After Midnight.     Take these medicines the morning of surgery with A SIP OF WATER: Amlodipine (Norvasc), and Hydralazine (Apresoline)                               Men may shave face and neck.   Do not bring valuables to the hospital. [place].  Contacts, dentures or bridgework may not be worn into surgery.  Leave suitcase in the car. After surgery it may be brought to your room.                  Please read over the following fact sheets you were given: _____________________________________________________________________             [facility] - Preparing for Surgery Before surgery, you can play an important role.  Because skin is not sterile, your skin needs to be as free of germs as possible.  You can reduce the number of germs on your skin by washing with CHG (chlorahexidine gluconate) soap before surgery.  CHG is an antiseptic cleaner which kills germs and bonds with the skin to continue killing germs even after washing. Please DO NOT use if you have an allergy to CHG or antibacterial soaps.  If your skin becomes reddened/irritated stop using the CHG and inform your nurse when you arrive at Short Stay. Do not shave (including legs and underarms) for at least 48 hours prior to the first CHG shower.  You may shave your face/neck. Please follow these instructions carefully:  1.  Shower with CHG Soap the night before surgery and the  morning of Surgery.  2.  If you choose to wash your hair, wash your hair first as usual with your  normal   shampoo.  3.  After you shampoo, rinse your hair and body thoroughly to remove the  shampoo.                           4.  Use CHG as you would any other liquid soap.  You can apply chg directly  to the skin and wash                       Gently with a scrungie or clean washcloth.  5.  Apply the CHG Soap to your body ONLY FROM THE NECK DOWN.   Do not use on face/ open                           Wound or open sores. Avoid contact with eyes, ears mouth and genitals (private parts).  Wash face,  Genitals (private parts) with your normal soap.             6.  Wash thoroughly, paying special attention to the area where your surgery  will be performed.  7.  Thoroughly rinse your body with warm water from the neck down.  8.  DO NOT shower/wash with your normal soap after using and rinsing off  the CHG Soap.                9.  Pat yourself dry with a clean towel.            10.  Wear clean pajamas.            11.  Place clean sheets on your bed the night of your first shower and do not  sleep with pets. Day of Surgery : Do not apply any lotions/deodorants the morning of surgery.  Please wear clean clothes to the hospital/surgery center.  FAILURE TO FOLLOW THESE INSTRUCTIONS MAY RESULT IN THE CANCELLATION OF YOUR SURGERY PATIENT SIGNATURE_________________________________  NURSE SIGNATURE__________________________________  ________________________________________________________________________  WHAT IS A BLOOD TRANSFUSION? Blood Transfusion Information  A transfusion is the replacement of blood or some of its parts. Blood is made up of multiple cells which provide different functions.  Red blood cells carry oxygen and are used for blood loss replacement.  White blood cells fight against infection.  Platelets control bleeding.  Plasma helps clot blood.  Other blood products are available for specialized needs, such as hemophilia or other clotting disorders. BEFORE THE  TRANSFUSION  Who gives blood for transfusions?   Healthy volunteers who are fully evaluated to make sure their blood is safe. This is blood bank blood. Transfusion therapy is the safest it has ever been in the practice of medicine. Before blood is taken from a donor, a complete history is taken to make sure that person has no history of diseases nor engages in risky social behavior (examples are intravenous drug use or sexual activity with multiple partners). The donor's travel history is screened to minimize risk of transmitting infections, such as malaria. The donated blood is tested for signs of infectious diseases, such as HIV and hepatitis. The blood is then tested to be sure it is compatible with you in order to minimize the chance of a transfusion reaction. If you or a relative donates blood, this is often done in anticipation of surgery and is not appropriate for emergency situations. It takes many days to process the donated blood. RISKS AND COMPLICATIONS Although transfusion therapy is very safe and saves many lives, the main dangers of transfusion include:   Getting an infectious disease.  Developing a transfusion reaction. This is an allergic reaction to something in the blood you were given. Every precaution is taken to prevent this. The decision to have a blood transfusion has been considered carefully by your caregiver before blood is given. Blood is not given unless the benefits outweigh the risks. AFTER THE TRANSFUSION  Right after receiving a blood transfusion, you will usually feel much better and more energetic. This is especially true if your red blood cells have gotten low (anemic). The transfusion raises the level of the red blood cells which carry oxygen, and this usually causes an energy increase.  The nurse administering the transfusion will monitor you carefully for complications. HOME CARE INSTRUCTIONS  No special instructions are needed after a transfusion. You may find  your energy is better. Speak with your caregiver about any  limitations on activity for underlying diseases you may have. SEEK MEDICAL CARE IF:   Your condition is not improving after your transfusion.  You develop redness or irritation at the intravenous (IV) site. SEEK IMMEDIATE MEDICAL CARE IF:  Any of the following symptoms occur over the next 12 hours:  Shaking chills.  You have a temperature by mouth above 102 F (38.9 C), not controlled by medicine.  Chest, back, or muscle pain.  People around you feel you are not acting correctly or are confused.  Shortness of breath or difficulty breathing.  Dizziness and fainting.  You get a rash or develop hives.  You have a decrease in urine output.  Your urine turns a dark color or changes to pink, red, or brown. Any of the following symptoms occur over the next 10 days:  You have a temperature by mouth above 102 F (38.9 C), not controlled by medicine.  Shortness of breath.  Weakness after normal activity.  The white part of the eye turns yellow (jaundice).  You have a decrease in the amount of urine or are urinating less often.  Your urine turns a dark color or changes to pink, red, or brown. Document Released: 04/12/2000 Document Revised: 07/08/2011 Document Reviewed: 11/30/2007 [facility] Patient Information 2014 [place], [place].  _______________________________________________________________________

## 2016-10-17 ENCOUNTER — Ambulatory Visit (HOSPITAL_COMMUNITY)
Admission: RE | Admit: 2016-10-17 | Discharge: 2016-10-17 | Disposition: A | Discharge status: Home / Self Care | Payer: Medicaid Other | Source: Ambulatory Visit | Attending: Radiology | Admitting: Radiology

## 2016-10-17 ENCOUNTER — Encounter (HOSPITAL_COMMUNITY)
Admission: RE | Admit: 2016-10-17 | Discharge: 2016-10-17 | Disposition: A | Discharge status: Home / Self Care | Payer: Medicaid Other | Source: Ambulatory Visit | Attending: Interventional Radiology | Admitting: Interventional Radiology

## 2016-10-17 ENCOUNTER — Encounter (HOSPITAL_COMMUNITY): Payer: Self-pay

## 2016-10-17 DIAGNOSIS — Z01810 Encounter for preprocedural cardiovascular examination: Secondary | ICD-10-CM | POA: Insufficient documentation

## 2016-10-17 DIAGNOSIS — N2889 Other specified disorders of kidney and ureter: Secondary | ICD-10-CM | POA: Insufficient documentation

## 2016-10-17 DIAGNOSIS — Z01812 Encounter for preprocedural laboratory examination: Secondary | ICD-10-CM | POA: Insufficient documentation

## 2016-10-17 DIAGNOSIS — R9431 Abnormal electrocardiogram [ECG] [EKG]: Secondary | ICD-10-CM | POA: Insufficient documentation

## 2016-10-17 DIAGNOSIS — Z01818 Encounter for other preprocedural examination: Secondary | ICD-10-CM

## 2016-10-17 LAB — CBC WITH DIFFERENTIAL/PLATELET
BASOS ABS: 0 10*3/uL (ref 0.0–0.1)
Basophils Relative: 1 %
Eosinophils Absolute: 0 10*3/uL (ref 0.0–0.7)
Eosinophils Relative: 1 %
HCT: 33.2 % — ABNORMAL LOW (ref 39.0–52.0)
HEMOGLOBIN: 11.8 g/dL — AB (ref 13.0–17.0)
Lymphocytes Relative: 29 %
Lymphs Abs: 1.1 10*3/uL (ref 0.7–4.0)
MCH: 29.8 pg (ref 26.0–34.0)
MCHC: 35.5 g/dL (ref 30.0–36.0)
MCV: 83.8 fL (ref 78.0–100.0)
MONOS PCT: 21 %
Monocytes Absolute: 0.8 10*3/uL (ref 0.1–1.0)
NEUTROS PCT: 49 %
Neutro Abs: 1.8 10*3/uL (ref 1.7–7.7)
Platelets: 83 10*3/uL — ABNORMAL LOW (ref 150–400)
RBC: 3.96 MIL/uL — ABNORMAL LOW (ref 4.22–5.81)
RDW: 15.1 % (ref 11.5–15.5)
WBC: 3.7 10*3/uL — ABNORMAL LOW (ref 4.0–10.5)

## 2016-10-17 LAB — BASIC METABOLIC PANEL
ANION GAP: 7 (ref 5–15)
BUN: 18 mg/dL (ref 6–20)
CALCIUM: 8.7 mg/dL — AB (ref 8.9–10.3)
CO2: 21 mmol/L — AB (ref 22–32)
CREATININE: 1.87 mg/dL — AB (ref 0.61–1.24)
Chloride: 109 mmol/L (ref 101–111)
GFR, EST AFRICAN AMERICAN: 44 mL/min — AB (ref >60)
GFR, EST NON AFRICAN AMERICAN: 38 mL/min — AB (ref >60)
Glucose, Bld: 113 mg/dL — ABNORMAL HIGH (ref 65–99)
Potassium: 4.1 mmol/L (ref 3.5–5.1)
SODIUM: 137 mmol/L (ref 135–145)

## 2016-10-17 LAB — PROTIME-INR
INR: 1.13
PROTHROMBIN TIME: 14.5 s (ref 11.4–15.2)

## 2016-10-17 NOTE — Progress Notes (Signed)
10-17-16 Consulted with Dr. Tobias Alexander Anesthesiologist, per Ocie Cornfield, PA's request. Pt having Left renal cryoablation in Interventional Radiology on 11-08-16 . Per Dr. Tobias Alexander, no concerns at this time.

## 2016-10-17 NOTE — Progress Notes (Signed)
10-17-16 BMP result routed to Dr. Annamaria Boots for review.

## 2016-11-07 ENCOUNTER — Other Ambulatory Visit: Payer: Self-pay | Admitting: Radiology

## 2016-11-08 ENCOUNTER — Ambulatory Visit (HOSPITAL_COMMUNITY): Payer: Medicaid Other | Admitting: Anesthesiology

## 2016-11-08 ENCOUNTER — Observation Stay (HOSPITAL_COMMUNITY)
Admission: RE | Admit: 2016-11-08 | Discharge: 2016-11-09 | Disposition: A | Discharge status: Home / Self Care | Payer: Medicaid Other | Source: Ambulatory Visit | Attending: Interventional Radiology | Admitting: Interventional Radiology

## 2016-11-08 ENCOUNTER — Encounter (HOSPITAL_COMMUNITY): Payer: Self-pay

## 2016-11-08 ENCOUNTER — Encounter (HOSPITAL_COMMUNITY): Payer: Self-pay | Admitting: Certified Registered Nurse Anesthetist

## 2016-11-08 ENCOUNTER — Ambulatory Visit (HOSPITAL_COMMUNITY)
Admission: RE | Admit: 2016-11-08 | Discharge: 2016-11-08 | Disposition: A | Discharge status: Home / Self Care | Payer: Medicaid Other | Source: Ambulatory Visit | Attending: Interventional Radiology | Admitting: Interventional Radiology

## 2016-11-08 ENCOUNTER — Encounter (HOSPITAL_COMMUNITY): Payer: Self-pay | Admitting: *Deleted

## 2016-11-08 ENCOUNTER — Encounter (HOSPITAL_COMMUNITY)
Admission: RE | Disposition: A | Discharge status: Home / Self Care | Payer: Self-pay | Source: Ambulatory Visit | Attending: Interventional Radiology

## 2016-11-08 DIAGNOSIS — K219 Gastro-esophageal reflux disease without esophagitis: Secondary | ICD-10-CM | POA: Insufficient documentation

## 2016-11-08 DIAGNOSIS — Z8673 Personal history of transient ischemic attack (TIA), and cerebral infarction without residual deficits: Secondary | ICD-10-CM | POA: Diagnosis not present

## 2016-11-08 DIAGNOSIS — B1920 Unspecified viral hepatitis C without hepatic coma: Secondary | ICD-10-CM | POA: Diagnosis not present

## 2016-11-08 DIAGNOSIS — I864 Gastric varices: Secondary | ICD-10-CM | POA: Diagnosis not present

## 2016-11-08 DIAGNOSIS — N2889 Other specified disorders of kidney and ureter: Secondary | ICD-10-CM | POA: Diagnosis present

## 2016-11-08 DIAGNOSIS — K7460 Unspecified cirrhosis of liver: Secondary | ICD-10-CM | POA: Insufficient documentation

## 2016-11-08 DIAGNOSIS — Z87891 Personal history of nicotine dependence: Secondary | ICD-10-CM | POA: Insufficient documentation

## 2016-11-08 DIAGNOSIS — M549 Dorsalgia, unspecified: Secondary | ICD-10-CM | POA: Insufficient documentation

## 2016-11-08 DIAGNOSIS — G8929 Other chronic pain: Secondary | ICD-10-CM | POA: Insufficient documentation

## 2016-11-08 DIAGNOSIS — Z880 Allergy status to penicillin: Secondary | ICD-10-CM | POA: Insufficient documentation

## 2016-11-08 DIAGNOSIS — K766 Portal hypertension: Secondary | ICD-10-CM | POA: Diagnosis not present

## 2016-11-08 DIAGNOSIS — D631 Anemia in chronic kidney disease: Secondary | ICD-10-CM | POA: Diagnosis not present

## 2016-11-08 DIAGNOSIS — I509 Heart failure, unspecified: Secondary | ICD-10-CM | POA: Insufficient documentation

## 2016-11-08 DIAGNOSIS — H469 Unspecified optic neuritis: Secondary | ICD-10-CM | POA: Insufficient documentation

## 2016-11-08 DIAGNOSIS — N189 Chronic kidney disease, unspecified: Secondary | ICD-10-CM | POA: Diagnosis not present

## 2016-11-08 DIAGNOSIS — I130 Hypertensive heart and chronic kidney disease with heart failure and stage 1 through stage 4 chronic kidney disease, or unspecified chronic kidney disease: Secondary | ICD-10-CM | POA: Insufficient documentation

## 2016-11-08 DIAGNOSIS — C642 Malignant neoplasm of left kidney, except renal pelvis: Principal | ICD-10-CM | POA: Diagnosis present

## 2016-11-08 DIAGNOSIS — N289 Disorder of kidney and ureter, unspecified: Secondary | ICD-10-CM

## 2016-11-08 HISTORY — DX: Gastro-esophageal reflux disease without esophagitis: K21.9

## 2016-11-08 HISTORY — DX: Personal history of other diseases of the respiratory system: Z87.09

## 2016-11-08 HISTORY — DX: Personal history of other medical treatment: Z92.89

## 2016-11-08 HISTORY — PX: RADIOFREQUENCY ABLATION: SHX2290

## 2016-11-08 HISTORY — DX: Dyspnea, unspecified: R06.00

## 2016-11-08 HISTORY — DX: Unspecified convulsions: R56.9

## 2016-11-08 HISTORY — DX: Inflammatory liver disease, unspecified: K75.9

## 2016-11-08 LAB — COMPREHENSIVE METABOLIC PANEL
ALBUMIN: 3 g/dL — AB (ref 3.5–5.0)
ALK PHOS: 157 U/L — AB (ref 38–126)
ALT: 73 U/L — AB (ref 17–63)
AST: 123 U/L — AB (ref 15–41)
Anion gap: 7 (ref 5–15)
BILIRUBIN TOTAL: 2.2 mg/dL — AB (ref 0.3–1.2)
BUN: 17 mg/dL (ref 6–20)
CALCIUM: 8.6 mg/dL — AB (ref 8.9–10.3)
CO2: 20 mmol/L — AB (ref 22–32)
CREATININE: 1.68 mg/dL — AB (ref 0.61–1.24)
Chloride: 106 mmol/L (ref 101–111)
GFR calc Af Amer: 50 mL/min — ABNORMAL LOW (ref >60)
GFR calc non Af Amer: 43 mL/min — ABNORMAL LOW (ref >60)
GLUCOSE: 96 mg/dL (ref 65–99)
Potassium: 4.3 mmol/L (ref 3.5–5.1)
Sodium: 133 mmol/L — ABNORMAL LOW (ref 135–145)
Total Protein: 7.4 g/dL (ref 6.5–8.1)

## 2016-11-08 LAB — TYPE AND SCREEN
ABO/RH(D): O POS
Antibody Screen: NEGATIVE

## 2016-11-08 LAB — CBC WITH DIFFERENTIAL/PLATELET
Basophils Absolute: 0 10*3/uL (ref 0.0–0.1)
Basophils Relative: 1 %
EOS ABS: 0.1 10*3/uL (ref 0.0–0.7)
Eosinophils Relative: 2 %
HEMATOCRIT: 30.9 % — AB (ref 39.0–52.0)
HEMOGLOBIN: 11.1 g/dL — AB (ref 13.0–17.0)
LYMPHS ABS: 1.2 10*3/uL (ref 0.7–4.0)
Lymphocytes Relative: 29 %
MCH: 29.9 pg (ref 26.0–34.0)
MCHC: 35.9 g/dL (ref 30.0–36.0)
MCV: 83.3 fL (ref 78.0–100.0)
Monocytes Absolute: 1 10*3/uL (ref 0.1–1.0)
Monocytes Relative: 24 %
NEUTROS ABS: 1.8 10*3/uL (ref 1.7–7.7)
NEUTROS PCT: 44 %
Platelets: 79 10*3/uL — ABNORMAL LOW (ref 150–400)
RBC: 3.71 MIL/uL — AB (ref 4.22–5.81)
RDW: 14.7 % (ref 11.5–15.5)
WBC: 4.2 10*3/uL (ref 4.0–10.5)

## 2016-11-08 SURGERY — RADIO FREQUENCY ABLATION
Anesthesia: General | Laterality: Left

## 2016-11-08 MED ORDER — DOCUSATE SODIUM 100 MG PO CAPS
100.0000 mg | ORAL_CAPSULE | Freq: Two times a day (BID) | ORAL | Status: DC
  Administered 2016-11-08 – 2016-11-09 (×3): 100 mg via ORAL
  Filled 2016-11-08 (×3): qty 1

## 2016-11-08 MED ORDER — LIDOCAINE 2% (20 MG/ML) 5 ML SYRINGE
INTRAMUSCULAR | Status: DC | PRN
  Administered 2016-11-08: 50 mg via INTRAVENOUS

## 2016-11-08 MED ORDER — MIDAZOLAM HCL 5 MG/5ML IJ SOLN
INTRAMUSCULAR | Status: DC | PRN
  Administered 2016-11-08: 2 mg via INTRAVENOUS

## 2016-11-08 MED ORDER — VANCOMYCIN HCL IN DEXTROSE 1-5 GM/200ML-% IV SOLN
1000.0000 mg | INTRAVENOUS | Status: AC
  Administered 2016-11-08: 1000 mg via INTRAVENOUS
  Filled 2016-11-08: qty 200

## 2016-11-08 MED ORDER — HYDROCODONE-ACETAMINOPHEN 5-325 MG PO TABS
1.0000 | ORAL_TABLET | ORAL | Status: DC | PRN
  Administered 2016-11-08 – 2016-11-09 (×2): 2 via ORAL
  Filled 2016-11-08 (×2): qty 2

## 2016-11-08 MED ORDER — SPIRONOLACTONE 25 MG PO TABS
25.0000 mg | ORAL_TABLET | Freq: Two times a day (BID) | ORAL | Status: DC
  Administered 2016-11-08 – 2016-11-09 (×2): 25 mg via ORAL
  Filled 2016-11-08 (×2): qty 1

## 2016-11-08 MED ORDER — EPHEDRINE SULFATE-NACL 50-0.9 MG/10ML-% IV SOSY
PREFILLED_SYRINGE | INTRAVENOUS | Status: DC | PRN
  Administered 2016-11-08: 10 mg via INTRAVENOUS
  Administered 2016-11-08: 5 mg via INTRAVENOUS

## 2016-11-08 MED ORDER — HYDRALAZINE HCL 50 MG PO TABS
50.0000 mg | ORAL_TABLET | Freq: Three times a day (TID) | ORAL | Status: DC
  Administered 2016-11-08 – 2016-11-09 (×3): 50 mg via ORAL
  Filled 2016-11-08 (×3): qty 1

## 2016-11-08 MED ORDER — MIDAZOLAM HCL 2 MG/2ML IJ SOLN
INTRAMUSCULAR | Status: AC
  Filled 2016-11-08: qty 2

## 2016-11-08 MED ORDER — SUGAMMADEX SODIUM 200 MG/2ML IV SOLN
INTRAVENOUS | Status: DC | PRN
  Administered 2016-11-08: 200 mg via INTRAVENOUS

## 2016-11-08 MED ORDER — SODIUM CHLORIDE 0.9 % IV SOLN
INTRAVENOUS | Status: AC
  Filled 2016-11-08: qty 250

## 2016-11-08 MED ORDER — PANTOPRAZOLE SODIUM 40 MG PO TBEC
40.0000 mg | DELAYED_RELEASE_TABLET | Freq: Every day | ORAL | Status: DC
  Administered 2016-11-09: 40 mg via ORAL
  Filled 2016-11-08 (×2): qty 1

## 2016-11-08 MED ORDER — ONDANSETRON HCL 4 MG/2ML IJ SOLN
INTRAMUSCULAR | Status: DC | PRN
  Administered 2016-11-08: 4 mg via INTRAVENOUS

## 2016-11-08 MED ORDER — ROCURONIUM BROMIDE 50 MG/5ML IV SOSY
PREFILLED_SYRINGE | INTRAVENOUS | Status: DC | PRN
  Administered 2016-11-08: 50 mg via INTRAVENOUS

## 2016-11-08 MED ORDER — AMLODIPINE BESYLATE 10 MG PO TABS
10.0000 mg | ORAL_TABLET | Freq: Every day | ORAL | Status: DC
  Administered 2016-11-09: 10 mg via ORAL
  Filled 2016-11-08 (×2): qty 1

## 2016-11-08 MED ORDER — FENTANYL CITRATE (PF) 100 MCG/2ML IJ SOLN: 25.0000 ug | INTRAMUSCULAR | Status: DC | PRN

## 2016-11-08 MED ORDER — PROMETHAZINE HCL 25 MG/ML IJ SOLN: 6.2500 mg | INTRAMUSCULAR | Status: DC | PRN

## 2016-11-08 MED ORDER — FENTANYL CITRATE (PF) 100 MCG/2ML IJ SOLN
INTRAMUSCULAR | Status: DC | PRN
  Administered 2016-11-08: 50 ug via INTRAVENOUS
  Administered 2016-11-08: 100 ug via INTRAVENOUS
  Administered 2016-11-08 (×2): 50 ug via INTRAVENOUS

## 2016-11-08 MED ORDER — IOPAMIDOL (ISOVUE-300) INJECTION 61%
INTRAVENOUS | Status: AC
  Filled 2016-11-08: qty 30

## 2016-11-08 MED ORDER — FENTANYL CITRATE (PF) 250 MCG/5ML IJ SOLN
INTRAMUSCULAR | Status: AC
  Filled 2016-11-08: qty 10

## 2016-11-08 MED ORDER — ONDANSETRON HCL 4 MG/2ML IJ SOLN: 4.0000 mg | Freq: Four times a day (QID) | INTRAMUSCULAR | Status: DC | PRN

## 2016-11-08 MED ORDER — PROPOFOL 10 MG/ML IV BOLUS
INTRAVENOUS | Status: DC | PRN
  Administered 2016-11-08: 150 mg via INTRAVENOUS

## 2016-11-08 MED ORDER — LACTULOSE 10 GM/15ML PO SOLN
10.0000 g | Freq: Every day | ORAL | Status: DC
  Administered 2016-11-08 – 2016-11-09 (×2): 10 g via ORAL
  Filled 2016-11-08 (×2): qty 15

## 2016-11-08 MED ORDER — PHENYLEPHRINE 40 MCG/ML (10ML) SYRINGE FOR IV PUSH (FOR BLOOD PRESSURE SUPPORT)
PREFILLED_SYRINGE | INTRAVENOUS | Status: DC | PRN
  Administered 2016-11-08: 80 ug via INTRAVENOUS
  Administered 2016-11-08: 120 ug via INTRAVENOUS
  Administered 2016-11-08: 80 ug via INTRAVENOUS

## 2016-11-08 MED ORDER — LACTATED RINGERS IV SOLN
INTRAVENOUS | Status: DC
  Administered 2016-11-08 – 2016-11-09 (×4): via INTRAVENOUS

## 2016-11-08 MED ORDER — GLYCOPYRROLATE 0.2 MG/ML IV SOSY
PREFILLED_SYRINGE | INTRAVENOUS | Status: DC | PRN
Start: 2016-11-08 — End: 2016-11-08
  Administered 2016-11-08 (×3): .1 mg via INTRAVENOUS

## 2016-11-08 NOTE — Anesthesia Procedure Notes (Signed)
Procedure Name: Intubation Date/Time: 11/08/2016 9:11 AM Performed by: Deliah Boston Pre-anesthesia Checklist: Patient identified, Emergency Drugs available, Suction available and Patient being monitored Patient Re-evaluated:Patient Re-evaluated prior to induction Oxygen Delivery Method: Circle system utilized Preoxygenation: Pre-oxygenation with 100% oxygen Induction Type: IV induction Ventilation: Mask ventilation without difficulty Laryngoscope Size: Mac and 4 Grade View: Grade I Tube type: Oral Tube size: 7.5 mm Number of attempts: 1 Airway Equipment and Method: Stylet and Oral airway Placement Confirmation: ETT inserted through vocal cords under direct vision,  positive ETCO2 and breath sounds checked- equal and bilateral Secured at: 22 cm Tube secured with: Tape Dental Injury: Teeth and Oropharynx as per pre-operative assessment

## 2016-11-08 NOTE — Progress Notes (Signed)
Valuables envelope take to security with pts wallet, watch and cell phone; confirmation sheet and key placed on pts chart.

## 2016-11-08 NOTE — Anesthesia Postprocedure Evaluation (Signed)
Anesthesia Post Note  Patient: John [name]  Procedure(s) Performed: Procedure(s) (LRB): LEFT RENAL CRYOABLATION (Left)     Patient location during evaluation: PACU Anesthesia Type: General Level of consciousness: awake and alert Pain management: pain level controlled Vital Signs Assessment: post-procedure vital signs reviewed and stable Respiratory status: spontaneous breathing, nonlabored ventilation, respiratory function stable and patient connected to nasal cannula oxygen Cardiovascular status: blood pressure returned to baseline and stable Postop Assessment: no signs of nausea or vomiting Anesthetic complications: no    Last Vitals:  Vitals:   11/08/16 1302 11/08/16 1330  BP: 128/79 123/75  Pulse: 63 60  Resp: 18 18  Temp: 37 C 36.7 C    Last Pain:  Vitals:   11/08/16 1330  TempSrc: Oral                 Tiajuana Amass

## 2016-11-08 NOTE — Transfer of Care (Signed)
Immediate Anesthesia Transfer of Care Note  Patient: John [name]  Procedure(s) Performed: Procedure(s): LEFT RENAL CRYOABLATION (Left)  Patient Location: PACU  Anesthesia Type:General  Level of Consciousness: Patient easily awoken, sedated, comfortable, cooperative, following commands, responds to stimulation.   Airway & Oxygen Therapy: Patient spontaneously breathing, ventilating well, oxygen via simple oxygen mask.  Post-op Assessment: Report given to PACU RN, vital signs reviewed and stable, moving all extremities.   Post vital signs: Reviewed and stable.  Complications: No apparent anesthesia complications  Last Vitals:  Vitals:   11/08/16 0706  BP: 121/74  Pulse: 68  Resp: 16  Temp: 36.8 C    Last Pain:  Vitals:   11/08/16 0706  TempSrc: Oral         Complications: No apparent anesthesia complications

## 2016-11-08 NOTE — Anesthesia Preprocedure Evaluation (Addendum)
Anesthesia Evaluation  Patient identified by MRN, date of birth, ID band Patient awake    Reviewed: Allergy & Precautions, NPO status , Patient's Chart, lab work & pertinent test results  Airway Mallampati: II  TM Distance: >3 FB Neck ROM: Full    Dental  (+) Dental Advisory Given, Loose, Poor Dentition, Missing   Pulmonary former smoker,    breath sounds clear to auscultation       Cardiovascular hypertension, Pt. on medications  Rhythm:Regular Rate:Normal     Neuro/Psych Seizures -,  CVA    GI/Hepatic GERD  ,(+) Hepatitis -, C  Endo/Other  negative endocrine ROS  Renal/GU CRFRenal diseaseLeft renal mass     Musculoskeletal   Abdominal   Peds  Hematology  (+) anemia , Thrombocytopenia   Anesthesia Other Findings   Reproductive/Obstetrics                            Lab Results  Component Value Date   WBC 4.2 11/08/2016   HGB 11.1 (L) 11/08/2016   HCT 30.9 (L) 11/08/2016   MCV 83.3 11/08/2016   PLT 79 (L) 11/08/2016   Lab Results  Component Value Date   CREATININE 1.68 (H) 11/08/2016   BUN 17 11/08/2016   NA 133 (L) 11/08/2016   K 4.3 11/08/2016   CL 106 11/08/2016   CO2 20 (L) 11/08/2016    Anesthesia Physical Anesthesia Plan  ASA: III  Anesthesia Plan: General   Post-op Pain Management:    Induction: Intravenous  PONV Risk Score and Plan: 2 and Ondansetron and Dexamethasone  Airway Management Planned: Oral ETT  Additional Equipment:   Intra-op Plan:   Post-operative Plan: Extubation in OR  Informed Consent: I have reviewed the patients History and Physical, chart, labs and discussed the procedure including the risks, benefits and alternatives for the proposed anesthesia with the patient or authorized representative who has indicated his/her understanding and acceptance.   Dental advisory given  Plan Discussed with: CRNA  Anesthesia Plan Comments:         Anesthesia Quick Evaluation

## 2016-11-08 NOTE — Progress Notes (Signed)
Patient ID: John [name], male   DOB: 1956-03-17, 60 y.o.   MRN: [id number] Patient doing fairly well. Has eaten. Has some mild left flank discomfort. No hematuria Vital signs stable; afebrile Puncture site left flank clean, dry, minimal tenderness; yellow urine in Foley bag Assessment/plan: Status post CT-guided left renal mass cryoablation earlier today; for overnight obs; check am labs; for follow-up with Dr. Annamaria Boots in IR clinic in 4 weeks.   Rowe Robert, [facility]

## 2016-11-08 NOTE — Sedation Documentation (Signed)
Anesthesia in to sedate and monitor pt for cryo ablation.

## 2016-11-08 NOTE — Procedures (Signed)
LEFT RENAL CA  S/P CT CRYO   No comp Stable Lesion too small to bx without compromising ablation needle placement  Stable to pacu  Full report in pacs  EBL 0

## 2016-11-08 NOTE — H&P (Signed)
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 Past medical history also significant for hepatitis C, cirrhosis, portal hypertension and chronic renal insufficiency. Past imaging has revealed a 1.4 cm left lateral upper pole renal mass suspicious for renal cell carcinoma. He has been seen in consultation by Dr. Annamaria Boots and deemed an appropriate candidate for left renal mass cryoablation. He presents today for the procedure. He currently denies fever, headache, chest pain, dyspnea, cough, nausea, vomiting or abnormal bleeding. He does have some intermittent abdominal discomfort , chronic back pain as well as nocturia. Past Medical History:  Diagnosis Date  . Ascites   . CHF (congestive heart failure) ([place])   . Chronic back pain   . Cirrhosis ([place])   . Dyspnea    walking   . ETOH abuse   . GERD (gastroesophageal reflux disease)   . Hepatitis   . History of blood transfusion   . History of bronchitis   . Hypertension   . Optic neuropathy, left   . Polysubstance abuse   . Seizures [facility])    age 51  . Stroke [facility]) 1998   Past Surgical History:  Procedure Laterality Date  . ESOPHAGOGASTRODUODENOSCOPY N/A 08/14/2014   Procedure: ESOPHAGOGASTRODUODENOSCOPY (EGD);  Surgeon: Ladene Artist, MD;  Location: [facility];  Service: Endoscopy;  Laterality: N/A;  . IR RADIOLOGIST EVAL & MGMT  09/25/2016  . none    . RADIOLOGY WITH ANESTHESIA N/A 08/15/2014   Procedure: RADIOLOGY WITH ANESTHESIA;  Surgeon: Greggory Keen, MD;  Location: [place];  Service: Radiology;  Laterality: N/A;      Allergies: Penicillins  Medications: Prior to Admission medications   Medication Sig Start Date End Date Taking?  Authorizing Provider  amLODipine (NORVASC) 10 MG tablet Take 1 tablet (10 mg total) by mouth daily. 05/25/15  Yes Debbe Odea, MD  hydrALAZINE (APRESOLINE) 50 MG tablet Take 1 tablet (50 mg total) by mouth 3 (three) times daily. 05/25/15  Yes Debbe Odea, MD  lactulose (CHRONULAC) 10 GM/15ML solution Take 57ml by mouth once daily as needed for constipation 11/01/16  Yes [provider]  pantoprazole (PROTONIX) 40 MG tablet Take 40 mg by mouth daily. 09/02/16  Yes [provider]  spironolactone (ALDACTONE) 25 MG tablet Take 25 mg by mouth 2 (two) times daily.   Yes [provider]     Vital Signs: BP 121/74   Pulse 68   Temp 98.2 F (36.8 C) (Oral)   Resp 16   Ht 5\' 10"  (1.778 m)   Wt 213 lb 6 oz (96.8 kg)   SpO2 98%   BMI 30.62 kg/m   Physical Exam awake, alert. Chest clear to auscultation bilaterally. Heart with regular rate and rhythm. Abdomen soft, mild distention, positive bowel sounds, nontender. No lower extremity edema.  Imaging: No results found.  Labs:  CBC:  Recent Labs  03/15/16 1211 10/17/16 1100 11/08/16 0647  WBC 4.0 3.7* 4.2  HGB 12.4* 11.8* 11.1*  HCT 34.1* 33.2* 30.9*  PLT 91* 83* 79*    COAGS:  Recent Labs  03/15/16 1211 10/17/16 1100  INR 1.09 1.13    BMP:  Recent Labs  03/15/16 1211 10/17/16 1100 11/08/16 0647  NA 134* 137 133*  K 4.5  4.1 4.3  CL 107 109 106  CO2 19* 21* 20*  GLUCOSE 90 113* 96  BUN 18 18 17   CALCIUM 8.8* 8.7* 8.6*  CREATININE 1.80* 1.87* 1.68*  GFRNONAA 40* 38* 43*  GFRAA 46* 44* 50*    LIVER FUNCTION TESTS:  Recent Labs  03/15/16 1211 11/08/16 0647  BILITOT 2.0* 2.2*  AST 199* 123*  ALT 105* 73*  ALKPHOS 133* 157*  PROT 7.3 7.4  ALBUMIN 3.3* 3.0*    Assessment and Plan: Patient with history of hepatitis C, cirrhosis, portal hypertension, chronic renal insufficiency, past history of BRTO of gastric varices in 2016; now with 1.4 cm left lateral upper pole renal mass  suspicious for renal cell carcinoma. Seen in consultation by Dr. Annamaria Boots and deemed an appropriate candidate for CT-guided left renal mass cryoablation. He presents today for the procedure. Details/risks of procedure, including but not limited to, internal bleeding, infection, worsening nephropathy, anesthesia-related complications discussed with patient with his understanding and consent. Postprocedure he will be admitted to the hospital for overnight observation. Labs reviewed- plts 79k today. Ok to proceed with case per Dr. Annamaria Boots.   Electronically Signed: D. Rowe Robert, PA-C 11/08/2016, 8:13 AM   I spent a total of 30 minutes at the the patient's bedside AND on the patient's hospital floor or unit, greater than 50% of which was counseling/coordinating care for CT-guided left renal mass cryoablation

## 2016-11-09 ENCOUNTER — Other Ambulatory Visit: Payer: Self-pay | Admitting: Physician Assistant

## 2016-11-09 DIAGNOSIS — C642 Malignant neoplasm of left kidney, except renal pelvis: Secondary | ICD-10-CM

## 2016-11-09 LAB — BASIC METABOLIC PANEL
Anion gap: 5 (ref 5–15)
BUN: 23 mg/dL — AB (ref 6–20)
CHLORIDE: 104 mmol/L (ref 101–111)
CO2: 25 mmol/L (ref 22–32)
CREATININE: 2.11 mg/dL — AB (ref 0.61–1.24)
Calcium: 8.7 mg/dL — ABNORMAL LOW (ref 8.9–10.3)
GFR calc Af Amer: 38 mL/min — ABNORMAL LOW (ref >60)
GFR calc non Af Amer: 33 mL/min — ABNORMAL LOW (ref >60)
GLUCOSE: 130 mg/dL — AB (ref 65–99)
POTASSIUM: 5.4 mmol/L — AB (ref 3.5–5.1)
SODIUM: 134 mmol/L — AB (ref 135–145)

## 2016-11-09 LAB — CBC
HEMATOCRIT: 30 % — AB (ref 39.0–52.0)
HEMOGLOBIN: 10.6 g/dL — AB (ref 13.0–17.0)
MCH: 29.9 pg (ref 26.0–34.0)
MCHC: 35.3 g/dL (ref 30.0–36.0)
MCV: 84.7 fL (ref 78.0–100.0)
Platelets: 69 10*3/uL — ABNORMAL LOW (ref 150–400)
RBC: 3.54 MIL/uL — AB (ref 4.22–5.81)
RDW: 14.6 % (ref 11.5–15.5)
WBC: 7.6 10*3/uL (ref 4.0–10.5)

## 2016-11-09 NOTE — Discharge Summary (Signed)
  Patient ID: John [name] MRN: [id number] DOB/AGE: 05/01/1955 60 y.o.  Admit date: 11/08/2016 Discharge date: 11/09/2016  Supervising Physician: Shick, Trevor  Patient Status: [facility] - In-pt  Admission Diagnoses:  Left Renal Mass compatible with renal cell carcinoma by imaging characteristics.  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 Past medical history also significant for hepatitis C, cirrhosis, portal hypertension and chronic renal insufficiency.   Imaging has revealed a 1.4 cm left lateral upper pole renal mass suspicious for renal cell carcinoma.   He has been seen in consultation by Dr. Shick and deemed an appropriate candidate for left renal mass cryoablation.   He presented yesterday for the procedure.   He has done well postoperatively.  He is eating well. He has ambulated in the hallway.  He currently denies fever, headache, chest pain, dyspnea, cough, nausea, vomiting or abnormal bleeding.  He does have some intermittent abdominal discomfort but his has not worsened during his stay.  He is ready for discharge home.  Labs reviewed by Dr. Shick, stable.  Recent Results (from the past 2160 hour(s))  Basic metabolic panel     Status: Abnormal   Collection Time: 10/17/16 11:00 AM  Result Value Ref Range   Sodium 137 135 - 145 mmol/L   Potassium 4.1 3.5 - 5.1 mmol/L   Chloride 109 101 - 111 mmol/L   CO2 21 (L) 22 - 32 mmol/L   Glucose, Bld 113 (H) 65 - 99 mg/dL   BUN 18 6 - 20 mg/dL   Creatinine, Ser 1.87 (H) 0.61 - 1.24 mg/dL   Calcium 8.7 (L) 8.9 - 10.3 mg/dL   GFR calc non Af Amer 38 (L) >60 mL/min   GFR calc Af Amer 44 (L) >60 mL/min    Comment: (NOTE) The eGFR has been  calculated using the CKD EPI equation. This calculation has not been validated in all clinical situations. eGFR's persistently <60 mL/min signify possible Chronic Kidney Disease.    Anion gap 7 5 - 15  CBC with Differential/Platelet     Status: Abnormal   Collection Time: 10/17/16 11:00 AM  Result Value Ref Range   WBC 3.7 (L) 4.0 - 10.5 K/uL   RBC 3.96 (L) 4.22 - 5.81 MIL/uL   Hemoglobin 11.8 (L) 13.0 - 17.0 g/dL   HCT 33.2 (L) 39.0 - 52.0 %   MCV 83.8 78.0 - 100.0 fL   MCH 29.8 26.0 - 34.0 pg   MCHC 35.5 30.0 - 36.0 g/dL   RDW 15.1 11.5 - 15.5 %   Platelets 83 (L) 150 - 400 K/uL    Comment: REPEATED TO VERIFY SPECIMEN CHECKED FOR CLOTS PLATELET COUNT CONFIRMED BY SMEAR    Neutrophils Relative % 49 %   Neutro Abs 1.8 1.7 - 7.7 K/uL   Lymphocytes Relative 29 %   Lymphs Abs 1.1 0.7 - 4.0 K/uL   Monocytes Relative 21 %   Monocytes Absolute 0.8 0.1 - 1.0 K/uL   Eosinophils Relative 1 %   Eosinophils Absolute 0.0 0.0 - 0.7 K/uL   Basophils Relative 1 %   Basophils Absolute 0.0 0.0 - 0.1 K/uL  Protime-INR     Status: None   Collection Time: 10/17/16 11:00 AM    Result Value Ref Range   Prothrombin Time 14.5 11.4 - 15.2 seconds   INR 1.13   CBC with Differential/Platelet     Status: Abnormal   Collection Time: 11/08/16  6:47 AM  Result Value Ref Range   WBC 4.2 4.0 - 10.5 K/uL   RBC 3.71 (L) 4.22 - 5.81 MIL/uL   Hemoglobin 11.1 (L) 13.0 - 17.0 g/dL   HCT 30.9 (L) 39.0 - 52.0 %   MCV 83.3 78.0 - 100.0 fL   MCH 29.9 26.0 - 34.0 pg   MCHC 35.9 30.0 - 36.0 g/dL   RDW 14.7 11.5 - 15.5 %   Platelets 79 (L) 150 - 400 K/uL    Comment: REPEATED TO VERIFY SPECIMEN CHECKED FOR CLOTS PLATELET COUNT CONFIRMED BY SMEAR    Neutrophils Relative % 44 %   Neutro Abs 1.8 1.7 - 7.7 K/uL   Lymphocytes Relative 29 %   Lymphs Abs 1.2 0.7 - 4.0 K/uL   Monocytes Relative 24 %   Monocytes Absolute 1.0 0.1 - 1.0 K/uL   Eosinophils Relative 2 %   Eosinophils Absolute 0.1 0.0 - 0.7 K/uL    Basophils Relative 1 %   Basophils Absolute 0.0 0.0 - 0.1 K/uL  Comprehensive metabolic panel     Status: Abnormal   Collection Time: 11/08/16  6:47 AM  Result Value Ref Range   Sodium 133 (L) 135 - 145 mmol/L   Potassium 4.3 3.5 - 5.1 mmol/L   Chloride 106 101 - 111 mmol/L   CO2 20 (L) 22 - 32 mmol/L   Glucose, Bld 96 65 - 99 mg/dL   BUN 17 6 - 20 mg/dL   Creatinine, Ser 1.68 (H) 0.61 - 1.24 mg/dL   Calcium 8.6 (L) 8.9 - 10.3 mg/dL   Total Protein 7.4 6.5 - 8.1 g/dL   Albumin 3.0 (L) 3.5 - 5.0 g/dL   AST 123 (H) 15 - 41 U/L   ALT 73 (H) 17 - 63 U/L   Alkaline Phosphatase 157 (H) 38 - 126 U/L   Total Bilirubin 2.2 (H) 0.3 - 1.2 mg/dL   GFR calc non Af Amer 43 (L) >60 mL/min   GFR calc Af Amer 50 (L) >60 mL/min    Comment: (NOTE) The eGFR has been calculated using the CKD EPI equation. This calculation has not been validated in all clinical situations. eGFR's persistently <60 mL/min signify possible Chronic Kidney Disease.    Anion gap 7 5 - 15  Type and screen [place]     Status: None   Collection Time: 11/08/16  7:23 AM  Result Value Ref Range   ABO/RH(D) O POS    Antibody Screen NEG    Sample Expiration 11/11/2016   CBC     Status: Abnormal   Collection Time: 11/09/16  4:26 AM  Result Value Ref Range   WBC 7.6 4.0 - 10.5 K/uL   RBC 3.54 (L) 4.22 - 5.81 MIL/uL   Hemoglobin 10.6 (L) 13.0 - 17.0 g/dL   HCT 30.0 (L) 39.0 - 52.0 %   MCV 84.7 78.0 - 100.0 fL   MCH 29.9 26.0 - 34.0 pg   MCHC 35.3 30.0 - 36.0 g/dL   RDW 14.6 11.5 - 15.5 %   Platelets 69 (L) 150 - 400 K/uL    Comment: CONSISTENT WITH PREVIOUS RESULT  Basic metabolic panel     Status: Abnormal   Collection Time: 11/09/16  6:38 AM  Result Value Ref Range   Sodium  134 (L) 135 - 145 mmol/L   Potassium 5.4 (H) 3.5 - 5.1 mmol/L    Comment: DELTA CHECK NOTED   Chloride 104 101 - 111 mmol/L   CO2 25 22 - 32 mmol/L   Glucose, Bld 130 (H) 65 - 99 mg/dL   BUN 23 (H) 6 - 20 mg/dL    Creatinine, Ser 2.11 (H) 0.61 - 1.24 mg/dL   Calcium 8.7 (L) 8.9 - 10.3 mg/dL   GFR calc non Af Amer 33 (L) >60 mL/min   GFR calc Af Amer 38 (L) >60 mL/min    Comment: (NOTE) The eGFR has been calculated using the CKD EPI equation. This calculation has not been validated in all clinical situations. eGFR's persistently <60 mL/min signify possible Chronic Kidney Disease.    Anion gap 5 5 - 15     Treatments:  INDICATION: 1.4 cm lateral left renal mass compatible with renal cell carcinoma by imaging characteristics.  EXAM: CT CRYOABLATION OF SMALL LEFT RENAL CELL CARCINOMA.  COMPARISON:  06/28/2016, 06/13/2016  MEDICATIONS: ANCEF 2 G; The antibiotic was administered in an appropriate time interval prior to needle puncture of the skin.  ANESTHESIA/SEDATION: General - as administered by the Anesthesia department  Moderate Sedation Time:   GENERAL  The patient was continuously monitored during the procedure by the interventional radiology nurse under my direct supervision.  FLUOROSCOPY TIME:  Fluoroscopy Time: NONE.  COMPLICATIONS: None immediate.  TECHNIQUE: Informed written consent was obtained from the patient after a thorough discussion of the procedural risks, benefits and alternatives. All questions were addressed. Maximal Sterile Barrier Technique was utilized including caps, mask, sterile gowns, sterile gloves, sterile drape, hand hygiene and skin antiseptic. A timeout was performed prior to the initiation of the procedure.  Previous imaging reviewed.  After induction of general anesthesia, the patient was positioned nearly prone. Noncontrast localization CT performed. The slightly exophytic left mid to upper pole lateral renal mass was localized. Overlying skin marked.  Under sterile conditions and local anesthesia, a single Ice Pearl 2.1 CX cryoablation needle was advanced from a posterior lower intercostal approach into the left renal lesion.  Needle position adjusted and confirmed with serial CT. Once the needle position was confirmed in the lesion, cryo ablation performed initially for 10 minutes followed by 5 minutes thaw phase. An additional 15 minutes of cryo ablation performed for the second treatment followed by 5 minutes thaw. Cautery then performed. Needle removed. Postprocedure imaging demonstrates a trace amount of hyperdense perinephric hemorrhage but no large hematoma or hydronephrosis. Patient tolerated the procedure well. No immediate complication.  FINDINGS: CT imaging confirms cryoablation needle probe inserted into the small left renal mass as above  IMPRESSION: Successful CT-guided cryoablation of the small left renal cell carcinoma.   Electronically Signed   By: Jerilynn Mages.  Shick M.D.   On: 11/08/2016 12:47   Discharge Exam: Blood pressure 132/83, pulse 77, temperature 98.1 F (36.7 C), temperature source Oral, resp. rate 16, height 5' 10" (1.778 m), weight 213 lb 6 oz (96.8 kg), SpO2 94 %. Awake and Alert NAD Abdomen round, soft Heart RRR Lungs clear Left flank stick site ok, no bleeding or bruising.  Disposition: 01-Home or Self Care  *Our clinic will call the patient and arrange for repeat BMP and follow up with Dr. Annamaria Boots in 4 weeks.  Allergies as of 11/09/2016      Reactions   Penicillins    Convulsions, Patient could not walk, childhood allergy  Has patient had a PCN reaction causing  immediate rash, facial/tongue/throat swelling, SOB or lightheadedness with hypotension: No Has patient had a PCN reaction causing severe rash involving mucus membranes or skin necrosis: No Has patient had a PCN reaction that required hospitalization No Has patient had a PCN reaction occurring within the last 10 years: No If all of the above answers are "NO", then may proceed with Cephalosporin use.      Medication List    TAKE these medications   amLODipine 10 MG tablet Commonly known as:  NORVASC Take  1 tablet (10 mg total) by mouth daily.   hydrALAZINE 50 MG tablet Commonly known as:  APRESOLINE Take 1 tablet (50 mg total) by mouth 3 (three) times daily.   lactulose 10 GM/15ML solution Commonly known as:  CHRONULAC Take 15ml by mouth once daily as needed for constipation   pantoprazole 40 MG tablet Commonly known as:  PROTONIX Take 40 mg by mouth daily.   spironolactone 25 MG tablet Commonly known as:  ALDACTONE Take 25 mg by mouth 2 (two) times daily.         Electronically Signed: WENDY S BLAIR, PA-C 11/09/2016, 9:31 AM   I have spent Less Than 30 Minutes discharging John [name].     

## 2016-11-09 NOTE — Progress Notes (Signed)
Patient discharged to home, all discharge medications and instructions reviewed and questions answered.  Patient to be assisted to vehicle by wheelchair.  

## 2016-11-14 ENCOUNTER — Other Ambulatory Visit: Payer: Self-pay | Admitting: *Deleted

## 2016-11-14 DIAGNOSIS — N289 Disorder of kidney and ureter, unspecified: Secondary | ICD-10-CM

## 2016-12-10 ENCOUNTER — Ambulatory Visit
Admission: RE | Admit: 2016-12-10 | Discharge: 2016-12-10 | Disposition: A | Discharge status: Home / Self Care | Payer: Medicaid Other | Source: Ambulatory Visit | Attending: Physician Assistant | Admitting: Physician Assistant

## 2016-12-10 DIAGNOSIS — C642 Malignant neoplasm of left kidney, except renal pelvis: Secondary | ICD-10-CM

## 2016-12-10 HISTORY — PX: IR RADIOLOGIST EVAL & MGMT: IMG5224

## 2016-12-10 LAB — BASIC METABOLIC PANEL WITH GFR
BUN: 17 mg/dL (ref 7–25)
CHLORIDE: 107 mmol/L (ref 98–110)
CO2: 20 mmol/L (ref 20–32)
CREATININE: 1.79 mg/dL — AB (ref 0.70–1.25)
Calcium: 8.3 mg/dL — ABNORMAL LOW (ref 8.6–10.3)
GFR, Est African American: 47 mL/min — ABNORMAL LOW (ref >=60)
GFR, Est Non African American: 40 mL/min — ABNORMAL LOW (ref >=60)
Glucose, Bld: 103 mg/dL — ABNORMAL HIGH (ref 65–99)
POTASSIUM: 4.4 mmol/L (ref 3.5–5.3)
Sodium: 135 mmol/L (ref 135–146)

## 2016-12-10 NOTE — Progress Notes (Signed)
Patient ID: John [name], male   DOB: 1955/11/10, 61 y.o.   MRN: [id number]       Chief Complaint: 1 month status post left renal cell carcinoma cryoablation.   Referring Physician(s): Ardis Rowan  History of Present Illness: John [name] is a 61 y.o. male with a prior history of chronic renal insufficiency, hepatitis C, cirrhosis, portal hypertension. He is status post balloon retrograde transvenous obliteration of gastric varices in 2016 and is well-known to our interventional service. He was found to have an incidental solid enhancing left renal mass by CT and MRI compatible with a renal cell carcinoma. This was treated with image guided cryoablation at [name] one month ago. The procedure went uneventfully. He has recovered at home very well. He is back to his regular physical state. No current abdominal pain, flank pain, dysuria or hematuria. No fevers. He returns for outpatient follow-up.  Past Medical History:  Diagnosis Date  . Ascites   . CHF (congestive heart failure) ([place])   . Chronic back pain   . Cirrhosis ([place])   . Dyspnea    walking   . ETOH abuse   . GERD (gastroesophageal reflux disease)   . Hepatitis   . History of blood transfusion   . History of bronchitis   . Hypertension   . Optic neuropathy, left   . Polysubstance abuse   . Seizures [facility])    age 89  . Stroke [facility]) 1998    Past Surgical History:  Procedure Laterality Date  . ESOPHAGOGASTRODUODENOSCOPY N/A 08/14/2014   Procedure: ESOPHAGOGASTRODUODENOSCOPY (EGD);  Surgeon: Ladene Artist, MD;  Location: [facility];  Service: Endoscopy;  Laterality: N/A;  . IR RADIOLOGIST EVAL & MGMT  09/25/2016  . none    . RADIOFREQUENCY ABLATION Left 11/08/2016   Procedure: LEFT RENAL CRYOABLATION;  Surgeon: Greggory Keen, MD;  Location: [facility];  Service: Anesthesiology;  Laterality: Left;  . RADIOLOGY WITH ANESTHESIA N/A 08/15/2014   Procedure: RADIOLOGY WITH ANESTHESIA;  Surgeon:  Greggory Keen, MD;  Location: [facility];  Service: Radiology;  Laterality: N/A;    Allergies: Penicillins  Medications: Prior to Admission medications   Medication Sig Start Date End Date Taking? Authorizing Provider  amLODipine (NORVASC) 10 MG tablet Take 1 tablet (10 mg total) by mouth daily. 05/25/15  Yes Debbe Odea, MD  hydrALAZINE (APRESOLINE) 50 MG tablet Take 1 tablet (50 mg total) by mouth 3 (three) times daily. 05/25/15  Yes Debbe Odea, MD  lactulose (CHRONULAC) 10 GM/15ML solution Take 44ml by mouth once daily as needed for constipation 11/01/16  Yes [provider]  pantoprazole (PROTONIX) 40 MG tablet Take 40 mg by mouth daily. 09/02/16  Yes [provider]  spironolactone (ALDACTONE) 25 MG tablet Take 25 mg by mouth 2 (two) times daily.   Yes [provider]     Family History  Problem Relation Age of Onset  . Hypertension Mother        Living  . Kidney disease Mother   . Diabetes Mother   . Heart disease Mother   . Hypertension Father        Deceased, 87  . Ulcers Father   . Hypertension Brother   . Diabetes Brother   . Kidney disease Brother   . Hypertension Sister     Social History   Social History  . Marital status: Single    Spouse name: N/A  . Number of children: N/A  . Years of education: N/A   Social  History Main Topics  . Smoking status: Former Smoker    Packs/day: 0.30    Years: 20.00    Types: Cigarettes  . Smokeless tobacco: Never Used     Comment: 2015  . Alcohol use No  . Drug use: No     Comment: since fall 2015  . Sexual activity: Not on file   Other Topics Concern  . Not on file   Social History Narrative   Lives with niece in a 2 story home.     On disability since 2015 for low back pain.  Used to work Statistician.    Highest level of education: 11th grade      Review of Systems: A 12 point ROS discussed and pertinent positives are indicated in the HPI above.  All other systems are  negative.  Review of Systems  Vital Signs: BP 127/77   Pulse 67   Temp 98.2 F (36.8 C) (Oral)   Resp 17   Ht 5\' 10"  (1.778 m)   Wt 215 lb (97.5 kg)   SpO2 99%   BMI 30.85 kg/m   Physical Exam  Constitutional: He is oriented to person, place, and time. He appears well-developed and well-nourished. No distress.  Eyes: Conjunctivae are normal. No scleral icterus.  Abdominal: Soft. Bowel sounds are normal. He exhibits no distension. There is no tenderness. There is no guarding.  Musculoskeletal: He exhibits no edema or deformity.  Neurological: He is alert and oriented to person, place, and time.  Skin: Skin is warm and dry. He is not diaphoretic. No erythema.  Psychiatric: He has a normal mood and affect. His behavior is normal.     Imaging: No results found.  Labs:  CBC:  Recent Labs  03/15/16 1211 10/17/16 1100 11/08/16 0647 11/09/16 0426  WBC 4.0 3.7* 4.2 7.6  HGB 12.4* 11.8* 11.1* 10.6*  HCT 34.1* 33.2* 30.9* 30.0*  PLT 91* 83* 79* 69*    COAGS:  Recent Labs  03/15/16 1211 10/17/16 1100  INR 1.09 1.13    BMP:  Recent Labs  10/17/16 1100 11/08/16 0647 11/09/16 0638 12/09/16 1446  NA 137 133* 134* 135  K 4.1 4.3 5.4* 4.4  CL 109 106 104 107  CO2 21* 20* 25 20  GLUCOSE 113* 96 130* 103*  BUN 18 17 23* 17  CALCIUM 8.7* 8.6* 8.7* 8.3*  CREATININE 1.87* 1.68* 2.11* 1.79*  GFRNONAA 38* 43* 33* 40*  GFRAA 44* 50* 38* 47*    LIVER FUNCTION TESTS:  Recent Labs  03/15/16 1211 11/08/16 0647  BILITOT 2.0* 2.2*  AST 199* 123*  ALT 105* 73*  ALKPHOS 133* 157*  PROT 7.3 7.4  ALBUMIN 3.3* 3.0*      Assessment and Plan:  1 month status post CT-guided cryoablation of a 1.4 cm solid enhancing left renal mass compatible with renal cell carcinoma. He has recovered at home very well. No delay complication. Stable creatinine measuring 1.79.  Plan: Outpatient follow-up in 3 months with a repeat MRI (to be scheduled at [facility]).  Thank you for this interesting consult.  I greatly enjoyed meeting Kayton Dunaj and look forward to participating in their care.  A copy of this report was sent to the requesting provider on this date.  Electronically Signed: MANTAJ, CHAMBERLIN 12/10/2016, 9:57 AM   I spent a total of    25 Minutes in face to face in clinical consultation, greater than 50% of which was counseling/coordinating care for this patient with  a left renal mass.

## 2016-12-13 ENCOUNTER — Other Ambulatory Visit: Payer: Self-pay | Admitting: Nurse Practitioner

## 2016-12-13 DIAGNOSIS — K7469 Other cirrhosis of liver: Secondary | ICD-10-CM

## 2016-12-19 ENCOUNTER — Ambulatory Visit
Admission: RE | Admit: 2016-12-19 | Discharge: 2016-12-19 | Disposition: A | Discharge status: Home / Self Care | Payer: Medicaid Other | Source: Ambulatory Visit | Attending: Nurse Practitioner | Admitting: Nurse Practitioner

## 2016-12-19 DIAGNOSIS — K7469 Other cirrhosis of liver: Secondary | ICD-10-CM

## 2016-12-25 ENCOUNTER — Emergency Department (HOSPITAL_COMMUNITY): Payer: Medicaid Other

## 2016-12-25 ENCOUNTER — Emergency Department (HOSPITAL_COMMUNITY)
Admission: EM | Admit: 2016-12-25 | Discharge: 2016-12-25 | Disposition: A | Discharge status: Home / Self Care | Payer: Medicaid Other | Attending: Emergency Medicine | Admitting: Emergency Medicine

## 2016-12-25 DIAGNOSIS — I509 Heart failure, unspecified: Secondary | ICD-10-CM | POA: Insufficient documentation

## 2016-12-25 DIAGNOSIS — Z79899 Other long term (current) drug therapy: Secondary | ICD-10-CM | POA: Insufficient documentation

## 2016-12-25 DIAGNOSIS — R5383 Other fatigue: Secondary | ICD-10-CM | POA: Diagnosis present

## 2016-12-25 DIAGNOSIS — N189 Chronic kidney disease, unspecified: Secondary | ICD-10-CM | POA: Insufficient documentation

## 2016-12-25 DIAGNOSIS — J069 Acute upper respiratory infection, unspecified: Secondary | ICD-10-CM | POA: Insufficient documentation

## 2016-12-25 DIAGNOSIS — Z87891 Personal history of nicotine dependence: Secondary | ICD-10-CM | POA: Insufficient documentation

## 2016-12-25 DIAGNOSIS — I130 Hypertensive heart and chronic kidney disease with heart failure and stage 1 through stage 4 chronic kidney disease, or unspecified chronic kidney disease: Secondary | ICD-10-CM | POA: Insufficient documentation

## 2016-12-25 DIAGNOSIS — M5136 Other intervertebral disc degeneration, lumbar region: Secondary | ICD-10-CM | POA: Diagnosis not present

## 2016-12-25 LAB — BASIC METABOLIC PANEL
Anion gap: 8 (ref 5–15)
BUN: 16 mg/dL (ref 6–20)
CHLORIDE: 107 mmol/L (ref 101–111)
CO2: 20 mmol/L — ABNORMAL LOW (ref 22–32)
CREATININE: 1.73 mg/dL — AB (ref 0.61–1.24)
Calcium: 8.8 mg/dL — ABNORMAL LOW (ref 8.9–10.3)
GFR calc non Af Amer: 41 mL/min — ABNORMAL LOW (ref >60)
GFR, EST AFRICAN AMERICAN: 48 mL/min — AB (ref >60)
Glucose, Bld: 100 mg/dL — ABNORMAL HIGH (ref 65–99)
Potassium: 4 mmol/L (ref 3.5–5.1)
SODIUM: 135 mmol/L (ref 135–145)

## 2016-12-25 LAB — HEPATIC FUNCTION PANEL
ALBUMIN: 3.1 g/dL — AB (ref 3.5–5.0)
ALK PHOS: 151 U/L — AB (ref 38–126)
ALT: 77 U/L — AB (ref 17–63)
AST: 147 U/L — ABNORMAL HIGH (ref 15–41)
Bilirubin, Direct: 1 mg/dL — ABNORMAL HIGH (ref 0.1–0.5)
Indirect Bilirubin: 0.9 mg/dL (ref 0.3–0.9)
TOTAL PROTEIN: 7.4 g/dL (ref 6.5–8.1)
Total Bilirubin: 1.9 mg/dL — ABNORMAL HIGH (ref 0.3–1.2)

## 2016-12-25 LAB — URINALYSIS, ROUTINE W REFLEX MICROSCOPIC
Bilirubin Urine: NEGATIVE
GLUCOSE, UA: NEGATIVE mg/dL
Hgb urine dipstick: NEGATIVE
Ketones, ur: NEGATIVE mg/dL
LEUKOCYTES UA: NEGATIVE
NITRITE: NEGATIVE
PROTEIN: NEGATIVE mg/dL
Specific Gravity, Urine: 1.01 (ref 1.005–1.030)
pH: 5 (ref 5.0–8.0)

## 2016-12-25 LAB — CBC
HCT: 34 % — ABNORMAL LOW (ref 39.0–52.0)
Hemoglobin: 12.1 g/dL — ABNORMAL LOW (ref 13.0–17.0)
MCH: 30.1 pg (ref 26.0–34.0)
MCHC: 35.6 g/dL (ref 30.0–36.0)
MCV: 84.6 fL (ref 78.0–100.0)
PLATELETS: 79 10*3/uL — AB (ref 150–400)
RBC: 4.02 MIL/uL — AB (ref 4.22–5.81)
RDW: 14.3 % (ref 11.5–15.5)
WBC: 3.5 10*3/uL — AB (ref 4.0–10.5)

## 2016-12-25 LAB — CBG MONITORING, ED: GLUCOSE-CAPILLARY: 100 mg/dL — AB (ref 65–99)

## 2016-12-25 LAB — BRAIN NATRIURETIC PEPTIDE: B Natriuretic Peptide: 209.3 pg/mL — ABNORMAL HIGH (ref 0.0–100.0)

## 2016-12-25 LAB — TROPONIN I: Troponin I: <0.03 ng/mL (ref <0.03)

## 2016-12-25 MED ORDER — AZITHROMYCIN 250 MG PO TABS: 250.0000 mg | ORAL_TABLET | Freq: Every day | ORAL | 0 refills | Status: DC

## 2016-12-25 MED ORDER — BENZONATATE 100 MG PO CAPS: 100.0000 mg | ORAL_CAPSULE | Freq: Three times a day (TID) | ORAL | 0 refills | Status: DC | PRN

## 2016-12-25 NOTE — ED Triage Notes (Signed)
Pt states that he has felt more fatigued today than normal. Denies unilateral weakness. Alert and oriented. Neuro intact.

## 2016-12-25 NOTE — ED Provider Notes (Signed)
[facility] DEPT Provider Note   CSN: [id number] Arrival date & time: 12/25/16  1312     History   Chief Complaint Chief Complaint  Patient presents with  . Fatigue    HPI John [name] is a 61 y.o. male.  HPI Patient with history of cirrhosis, renal cell carcinoma, and CHF presents with weakness. He describes having difficulty ambulating. Started gradually yesterday. Denies known trauma. Patient has chronic back pain he attributes to arthritis. He does complain of some numbness to the bilateral upper legs. This is been present for the last day. Patient also complains of cough productive of yellow sputum. He's had some shortness of breath and bilateral lower extremity swelling. Denies any fever or chills. Denies urinary or bowel incontinence. Past Medical History:  Diagnosis Date  . Ascites   . CHF (congestive heart failure) ([place])   . Chronic back pain   . Cirrhosis ([place])   . Dyspnea    walking   . ETOH abuse   . GERD (gastroesophageal reflux disease)   . Hepatitis   . History of blood transfusion   . History of bronchitis   . Hypertension   . Optic neuropathy, left   . Polysubstance abuse   . Seizures [facility])    age 14  . Stroke [facility]) 1998    Patient Active Problem List   Diagnosis Date Noted  . Renal cell carcinoma of left kidney ([place]) 11/08/2016  . Malnutrition of moderate degree 07/20/2015  . CHF (congestive heart failure) ([place]) 07/19/2015  . Abdominal pain 07/19/2015  . Renal failure (ARF), acute on chronic (HCC) 07/19/2015  . Acute-on-chronic kidney injury ([place])   . Hyponatremia 07/07/2015  . Dehydration 07/07/2015  . Acute respiratory failure ([place]) 07/07/2015  . Chronic kidney disease, stage 3 05/25/2015  . CAP (community acquired pneumonia) 05/23/2015  . Acute respiratory failure with hypoxia ([place]) 05/23/2015  . Fluid overload 10/31/2014  . Hepatitis C 10/31/2014  . Ascites of liver 10/31/2014  . Hepatitis, viral   . Edema of abdominal wall   .  Pedal edema   . Alcoholism ([place])   . Abdominal edema on examination 09/01/2014  . Chronic liver failure ([place])   . Alcoholic cirrhosis of liver with ascites ([place])   . Chronic hepatitis C with cirrhosis ([place])   . Cardiac hypertrophy   . Fever of unknown origin   . Diastolic dysfunction   . Mental status change   . Metabolic encephalopathy   . Pulmonary edema   . Acute encephalopathy   . Ascites due to alcoholic cirrhosis ([place])   . Hypernatremia   . Other specified fever   . Varices, gastric   . Acute on chronic renal failure ([place])   . Elevated transaminase level   . Essential hypertension   . Gastric varices   . Upper GI bleed 08/14/2014  . Acute blood loss anemia 08/14/2014  . Alcohol abuse 08/14/2014  . Coagulopathy ([place]) 08/14/2014  . AKI (acute kidney injury) ([place]) 08/14/2014  . GI bleed 08/14/2014  . GIB (gastrointestinal bleeding) 08/14/2014  . Bleeding gastric varices 08/14/2014  . Acute upper GI bleed   . Cirrhosis ([place]) 07/27/2014  . Abdominal pain, generalized 07/27/2014  . Ascites 07/26/2014  . Optic neuritis 07/11/2014  . Abnormal LFTs   . Vision loss of left eye 06/24/2014  . Accelerated hypertension 06/24/2014  . Elevated LFTs 06/24/2014  . Polysubstance abuse 06/24/2014  . Chronic kidney disease 06/24/2014  . SOB (shortness of breath) 06/24/2014  . Vision loss,  left eye 06/24/2014  . Thrombocytopenia ([place]) 06/24/2014  . Hypokalemia 06/24/2014  . Elevated troponin 06/24/2014  . Essential hypertension, benign 12/29/2012  . Neck pain 12/29/2012  . Low back pain 12/29/2012    Past Surgical History:  Procedure Laterality Date  . ESOPHAGOGASTRODUODENOSCOPY N/A 08/14/2014   Procedure: ESOPHAGOGASTRODUODENOSCOPY (EGD);  Surgeon: Ladene Artist, MD;  Location: [facility];  Service: Endoscopy;  Laterality: N/A;  . IR RADIOLOGIST EVAL & MGMT  09/25/2016  . none    . RADIOFREQUENCY ABLATION Left 11/08/2016   Procedure: LEFT RENAL CRYOABLATION;  Surgeon:  Greggory Keen, MD;  Location: [facility];  Service: Anesthesiology;  Laterality: Left;  . RADIOLOGY WITH ANESTHESIA N/A 08/15/2014   Procedure: RADIOLOGY WITH ANESTHESIA;  Surgeon: Greggory Keen, MD;  Location: [facility];  Service: Radiology;  Laterality: N/A;       Home Medications    Prior to Admission medications   Medication Sig Start Date End Date Taking? Authorizing Provider  amLODipine (NORVASC) 10 MG tablet Take 1 tablet (10 mg total) by mouth daily. 05/25/15  Yes Debbe Odea, MD  cycloSPORINE (RESTASIS) 0.05 % ophthalmic emulsion Place 1 drop into both eyes 2 (two) times daily.   Yes [provider]  hydrALAZINE (APRESOLINE) 50 MG tablet Take 1 tablet (50 mg total) by mouth 3 (three) times daily. 05/25/15  Yes Debbe Odea, MD  lactulose (CHRONULAC) 10 GM/15ML solution Take 75ml by mouth once daily as needed for constipation 11/01/16  Yes [provider]  spironolactone (ALDACTONE) 25 MG tablet Take 25 mg by mouth 2 (two) times daily.   Yes [provider]  azithromycin (ZITHROMAX) 250 MG tablet Take 1 tablet (250 mg total) by mouth daily. Take first 2 tablets together, then 1 every day until finished. 12/25/16   Julianne Rice, MD  benzonatate (TESSALON) 100 MG capsule Take 1 capsule (100 mg total) by mouth 3 (three) times daily as needed for cough. 12/25/16   Julianne Rice, MD    Family History Family History  Problem Relation Age of Onset  . Hypertension Mother        Living  . Kidney disease Mother   . Diabetes Mother   . Heart disease Mother   . Hypertension Father        Deceased, 78  . Ulcers Father   . Hypertension Brother   . Diabetes Brother   . Kidney disease Brother   . Hypertension Sister     Social History Social History  Substance Use Topics  . Smoking status: Former Smoker    Packs/day: 0.30    Years: 20.00    Types: Cigarettes  . Smokeless tobacco: Never Used     Comment: 2015  . Alcohol use No     Allergies     Penicillins   Review of Systems Review of Systems  Constitutional: Positive for fatigue. Negative for chills and fever.  HENT: Negative for facial swelling and trouble swallowing.   Eyes: Negative for photophobia and visual disturbance.  Respiratory: Positive for cough and shortness of breath.   Cardiovascular: Positive for leg swelling. Negative for chest pain and palpitations.  Gastrointestinal: Negative for abdominal pain, constipation, diarrhea, nausea and vomiting.  Genitourinary: Negative for difficulty urinating, dysuria, flank pain, frequency and hematuria.  Musculoskeletal: Positive for back pain and gait problem. Negative for neck pain and neck stiffness.  Skin: Negative for rash and wound.  Neurological: Positive for weakness and numbness. Negative for dizziness, tremors, syncope, speech difficulty, light-headedness and headaches.  All  other systems reviewed and are negative.    Physical Exam Updated Vital Signs BP 128/75   Pulse 77   Temp 98.1 F (36.7 C) (Oral)   Resp 16   SpO2 96%   Physical Exam  Constitutional: He is oriented to person, place, and time. He appears well-developed and well-nourished. No distress.  HENT:  Head: Normocephalic and atraumatic.  Mouth/Throat: Oropharynx is clear and moist. No oropharyngeal exudate.  Eyes: Pupils are equal, round, and reactive to light. EOM are normal.  Neck: Normal range of motion. Neck supple. JVD present.  No posterior midline cervical tenderness to palpation. No meningismus.  Cardiovascular: Normal rate and regular rhythm.  Exam reveals no gallop and no friction rub.   No murmur heard. Pulmonary/Chest: Effort normal and breath sounds normal.  Diminished breath sounds bilateral bases.  Abdominal: Soft. Bowel sounds are normal. There is no tenderness. There is no rebound and no guarding.  Musculoskeletal: Normal range of motion. He exhibits tenderness. He exhibits no edema.  Patient has superior lumbar midline  tenderness to palpation. No CVA tenderness. Distal pulses are 2+. 1+ bilateral lower extremity pitting edema.  Neurological: He is alert and oriented to person, place, and time.  2/5 bilateral lower extremity strength. 4/5 bilateral upper extremity strength. Decreased sensation over the lateral surface of the thighs. Cranial nerves II through XII grossly intact.  Skin: Skin is warm and dry. Capillary refill takes less than 2 seconds. No rash noted. No erythema.  Psychiatric: He has a normal mood and affect. His behavior is normal.  Nursing note and vitals reviewed.    ED Treatments / Results  Labs (all labs ordered are listed, but only abnormal results are displayed) Labs Reviewed  BASIC METABOLIC PANEL - Abnormal; Notable for the following:       Result Value   CO2 20 (*)    Glucose, Bld 100 (*)    Creatinine, Ser 1.73 (*)    Calcium 8.8 (*)    GFR calc non Af Amer 41 (*)    GFR calc Af Amer 48 (*)    All other components within normal limits  CBC - Abnormal; Notable for the following:    WBC 3.5 (*)    RBC 4.02 (*)    Hemoglobin 12.1 (*)    HCT 34.0 (*)    Platelets 79 (*)    All other components within normal limits  HEPATIC FUNCTION PANEL - Abnormal; Notable for the following:    Albumin 3.1 (*)    AST 147 (*)    ALT 77 (*)    Alkaline Phosphatase 151 (*)    Total Bilirubin 1.9 (*)    Bilirubin, Direct 1.0 (*)    All other components within normal limits  BRAIN NATRIURETIC PEPTIDE - Abnormal; Notable for the following:    B Natriuretic Peptide 209.3 (*)    All other components within normal limits  CBG MONITORING, ED - Abnormal; Notable for the following:    Glucose-Capillary 100 (*)    All other components within normal limits  URINALYSIS, ROUTINE W REFLEX MICROSCOPIC  TROPONIN I    EKG  EKG Interpretation  Date/Time:  Wednesday December 25 2016 13:27:25 EDT Ventricular Rate:  64 PR Interval:    QRS Duration: 96 QT Interval:  387 QTC Calculation: 400 R  Axis:   55 Text Interpretation:  Sinus or ectopic atrial rhythm with short PR Borderline repolarization abnormality Confirmed by Duffy Bruce [id number]) on 12/25/2016 4:23:26 PM  Radiology Dg Chest 2 View  Result Date: 12/25/2016 CLINICAL DATA:  Fatigue EXAM: CHEST  2 VIEW COMPARISON:  10/17/2016 FINDINGS: No acute pulmonary infiltrate, consolidation, or pleural effusion. Mild cardiomegaly. No pneumothorax. Coils in the left upper quadrant. IMPRESSION: Borderline to mild cardiomegaly.  Negative for  edema or infiltrate Electronically Signed   By: Donavan Foil M.D.   On: 12/25/2016 19:23   Mr Lumbar Spine Wo Contrast  Result Date: 12/25/2016 CLINICAL DATA:  Initial evaluation for generalized muscle weakness. EXAM: MRI LUMBAR SPINE WITHOUT CONTRAST TECHNIQUE: Multiplanar, multisequence MR imaging of the lumbar spine was performed. No intravenous contrast was administered. COMPARISON:  None available. FINDINGS: Segmentation: Normal segmentation. Lowest well-formed disc is labeled the L5-S1 level. Alignment: Trace 2 mm retrolisthesis of L1 on L2 and L2 on L3. 5 mm anterolisthesis of L5 on S1. No appreciable pars defect. Vertebrae: Vertebral body heights are maintained. No evidence for acute or chronic fracture. Signal intensity within the vertebral body bone marrow is patchy and heterogeneous without discrete or worrisome osseous lesion. No abnormal marrow edema. Mild reactive endplate changes about the L5-S1 interspace. Conus medullaris: Extends to the L1 level and appears normal. Paraspinal and other soft tissues: Paraspinous soft tissues demonstrate no acute abnormality. Asymmetric fatty atrophy noted within the left psoas muscle. Partially visualized visceral structures grossly unremarkable. Disc levels: L1-2: Trace 2 mm retrolisthesis. Mild diffuse disc bulge with disc desiccation and intervertebral disc space narrowing. Disc bulging eccentric to the right without focal disc protrusion. Mild facet  and ligamentum flavum hypertrophy. Small reactive effusions within the bilateral L1-2 facets. No significant canal stenosis. Mild left L1 foraminal narrowing. No significant right foraminal encroachment. L2-3: Trace 2 mm retrolisthesis. Diffuse disc bulge with disc desiccation and intervertebral disc space narrowing. Disc bulging fairly circumferential without focal disc protrusion. Mild to moderate facet and ligamentum flavum hypertrophy. Mild small reactive effusions within the bilateral L2-3 facets. No significant canal or foraminal stenosis. L3-4: Chronic degenerative intervertebral disc space narrowing with diffuse disc bulge and disc desiccation. Mild marginal endplate osteophytic spurring, greater on the right. Facet and ligamentum flavum hypertrophy. Mild resultant mild right lateral subarticular stenosis without significant canal narrowing. Mild to moderate right with mild left L3 foraminal stenosis. L4-5: Diffuse degenerative disc bulge with disc desiccation and intervertebral disc space narrowing. Broad posterior bulging disc indents and partially faces the ventral thecal sac, encroaching upon the lateral recesses, greater on the left. Facet and ligamentum flavum hypertrophy. Resultant mild left lateral recess stenosis without significant canal narrowing. Moderate bilateral L4 foraminal stenosis. L5-S1: Chronic 5 mm anterolisthesis of L5 on S1. Associated broad posterior disc bulge. Advanced bilateral facet arthrosis. Resultant moderate bilateral subarticular and foraminal stenosis, potentially affecting either the L5 or descending S1 nerve roots. IMPRESSION: 1. No acute abnormality within the lumbar spine. No evidence for cord compression. 2. Chronic 5 mm anterolisthesis of L5 on S1 with associated moderate bilateral and foraminal stenosis, potentially affecting either of the L5 or S1 nerve roots bilaterally. 3. Degenerative disc bulge and facet hypertrophy at L4-5 with resultant moderate bilateral L4  foraminal stenosis. 4. Additional mild degenerative spondylolysis at L1-2 thru L3-4 without significant stenosis or evidence for neural impingement. Electronically Signed   By: Jeannine Boga M.D.   On: 12/25/2016 21:34    Procedures Procedures (including critical care time)  Medications Ordered in ED Medications - No data to display   Initial Impression / Assessment and Plan / ED Course  I have reviewed the triage vital signs and  the nursing notes.  Pertinent labs & imaging results that were available during my care of the patient were reviewed by me and considered in my medical decision making (see chart for details).     Recently diagnosed with renal cell carcinoma and underwent ablation. Has chronic degenerative changes in the lumbar spine. However, appears very tender to midline palpation in the superior lumbar region. Bilateral lower extremity weakness and numbness. Will get MRI lumbar spine. Patient also with productive cough and lower extremity swelling. Question CHF versus infectious process.   Patient is ambulating with a cane. Vital signs remained stable. MRI with degenerative disc disease but no spinal cord compression. We'll treat for URI. Advised to follow-up with neurosurgery for ongoing low back symptoms. Return precautions given. Final Clinical Impressions(s) / ED Diagnoses   Final diagnoses:  Fatigue, unspecified type  URI, acute  Degenerative disc disease, lumbar    New Prescriptions New Prescriptions   AZITHROMYCIN (ZITHROMAX) 250 MG TABLET    Take 1 tablet (250 mg total) by mouth daily. Take first 2 tablets together, then 1 every day until finished.   BENZONATATE (TESSALON) 100 MG CAPSULE    Take 1 capsule (100 mg total) by mouth 3 (three) times daily as needed for cough.     Julianne Rice, MD 12/25/16 2308

## 2016-12-25 NOTE — ED Notes (Signed)
Patient knows of the need for urine. Urinal placed at bedside

## 2016-12-25 NOTE — ED Notes (Signed)
Pt had drawn in triage: Gold  Lavender Blue Lt green Dark green

## 2016-12-25 NOTE — ED Notes (Signed)
This RN spoke with lab, reports will add on labs just ordered.

## 2016-12-25 NOTE — ED Notes (Signed)
Patient transported to MRI 

## 2016-12-25 NOTE — ED Notes (Signed)
Pt ambulated from the bed to the door and back to bed done well with cane

## 2017-02-10 ENCOUNTER — Encounter: Payer: Self-pay | Admitting: Interventional Radiology

## 2017-02-10 ENCOUNTER — Other Ambulatory Visit (HOSPITAL_COMMUNITY): Payer: Self-pay | Admitting: Interventional Radiology

## 2017-02-10 DIAGNOSIS — C642 Malignant neoplasm of left kidney, except renal pelvis: Secondary | ICD-10-CM

## 2017-02-11 ENCOUNTER — Other Ambulatory Visit: Payer: Self-pay | Admitting: *Deleted

## 2017-02-11 DIAGNOSIS — C642 Malignant neoplasm of left kidney, except renal pelvis: Secondary | ICD-10-CM

## 2017-02-14 ENCOUNTER — Ambulatory Visit: Payer: Medicaid Other | Admitting: Gastroenterology

## 2017-03-11 ENCOUNTER — Ambulatory Visit (HOSPITAL_COMMUNITY)
Admission: RE | Admit: 2017-03-11 | Discharge: 2017-03-11 | Disposition: A | Discharge status: Home / Self Care | Payer: Medicaid Other | Source: Ambulatory Visit | Attending: Interventional Radiology | Admitting: Interventional Radiology

## 2017-03-11 ENCOUNTER — Ambulatory Visit: Payer: Self-pay

## 2017-03-11 DIAGNOSIS — K7460 Unspecified cirrhosis of liver: Secondary | ICD-10-CM | POA: Diagnosis not present

## 2017-03-11 DIAGNOSIS — C642 Malignant neoplasm of left kidney, except renal pelvis: Secondary | ICD-10-CM | POA: Insufficient documentation

## 2017-03-11 DIAGNOSIS — R93422 Abnormal radiologic findings on diagnostic imaging of left kidney: Secondary | ICD-10-CM | POA: Insufficient documentation

## 2017-03-11 LAB — POCT I-STAT CREATININE: CREATININE: 1.5 mg/dL — AB (ref 0.61–1.24)

## 2017-03-11 MED ORDER — GADOBENATE DIMEGLUMINE 529 MG/ML IV SOLN
20.0000 mL | Freq: Once | INTRAVENOUS | Status: AC | PRN
  Administered 2017-03-11: 20 mL via INTRAVENOUS

## 2017-03-25 ENCOUNTER — Other Ambulatory Visit: Payer: Medicaid Other

## 2017-04-16 ENCOUNTER — Ambulatory Visit (INDEPENDENT_AMBULATORY_CARE_PROVIDER_SITE_OTHER): Payer: Medicaid Other | Admitting: Gastroenterology

## 2017-04-16 ENCOUNTER — Encounter: Payer: Self-pay | Admitting: Gastroenterology

## 2017-04-16 VITALS — BP 124/70 | HR 68 | Ht 68.0 in | Wt 222.0 lb

## 2017-04-16 DIAGNOSIS — K7460 Unspecified cirrhosis of liver: Secondary | ICD-10-CM | POA: Diagnosis not present

## 2017-04-16 DIAGNOSIS — R935 Abnormal findings on diagnostic imaging of other abdominal regions, including retroperitoneum: Secondary | ICD-10-CM | POA: Diagnosis not present

## 2017-04-16 DIAGNOSIS — I864 Gastric varices: Secondary | ICD-10-CM | POA: Diagnosis not present

## 2017-04-16 NOTE — Patient Instructions (Signed)
You have been scheduled for an endoscopy. Please follow written instructions given to you at your visit today. If you use inhalers (even only as needed), please bring them with you on the day of your procedure. Your physician has requested that you go to www.startemmi.com and enter the access code given to you at your visit today. This web site gives a general overview about your procedure. However, you should still follow specific instructions given to you by our office regarding your preparation for the procedure.  Thank you for choosing me and [facility].  Malcolm T. Stark, Jr., MD., FACG  

## 2017-04-16 NOTE — Progress Notes (Signed)
    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 In May 2016 he was hospitalized for bleeding gastric varices and hepatic encephalopathy. Gastric varices treated with BRTO. He was not a TIPS candidate at the time due to a high MELD. No esophageal varices on EGD. CT from earlier this year shows distal esophageal varices.  He has been under the care of [facility] with Roosevelt Locks, NP.  He has a hepatitis C, genotype 1 a, treatment nave and has recently started therapy.  He has had a markedly elevated AFP up to a level of 400 that decreased to 272.  Imaging studies have not shown an hepatic mass.  A 1.4 cm renal lesion consistent with renal cell cancer was documented and he underwent cryoablation therapy in July.  He is referred for EGD for follow-up of gastric varices and imaging studies indicating possible esophageal varices.  Abd MRI 02/2017: 1. Small focus of rounded enhancement in the left renal RFA bed may reflect granulation tissue or residual tumor. Attention on follow-up recommended. 2. No evidence of metastatic disease. 3. Changes of hepatic cirrhosis with probable small transient hepatic intensity differences (incidental vascular phenomena) and sequela of hypertension. No worrisome hepatic findings.  Abd/pelvic CT 05/2016: 1. Stable appearance of hepatic cirrhosis without hepatic mass. Reactive adenopathy and varices in the gastrohepatic ligament and porta hepatis, with uphill varices adjacent to the distal esophagus. No current findings of hepatocellular carcinoma. 2. Mild cardiomegaly with right coronary artery atherosclerotic calcification. Aortoiliac atherosclerotic vascular disease.   Current Medications, Allergies, Past Medical History, Past Surgical History, Family History and Social History were reviewed in [employer] record.  Physical Exam: General: Well developed, well  nourished, no acute distress Head: Normocephalic and atraumatic Eyes:  sclerae anicteric, EOMI Ears: Normal auditory acuity Mouth: No deformity or lesions Lungs: Clear throughout to auscultation Heart: Regular rate and rhythm; no murmurs, rubs or bruits Abdomen: back brace impaired exam Musculoskeletal: Symmetrical with no gross deformities  Pulses:  Normal pulses noted Extremities: No clubbing, cyanosis, edema or deformities noted Neurological: Alert oriented x 4, grossly nonfocal Psychological:  Alert and cooperative. Normal mood and affect  Assessment and Recommendations:  1. Cirrhosis, alcohol and hepatitis C. No alcohol since 2016.  Follow-up and management per [facility].   2. History of bleeding gastric varices treated with BRTO.  Distal esophageal varices noted on CT imaging.  Schedule EGD with possible banding of esophageal varices if present. The risks (including bleeding, perforation, infection, missed lesions, medication reactions and possible hospitalization or surgery if complications occur), benefits, and alternatives to endoscopy with possible biopsy and possible dilation were discussed with the patient and they consent to proceed.

## 2017-05-19 ENCOUNTER — Encounter (HOSPITAL_COMMUNITY): Payer: Self-pay | Admitting: Emergency Medicine

## 2017-05-19 ENCOUNTER — Other Ambulatory Visit: Payer: Self-pay

## 2017-06-03 ENCOUNTER — Ambulatory Visit (HOSPITAL_COMMUNITY)
Admission: RE | Admit: 2017-06-03 | Discharge: 2017-06-03 | Disposition: A | Discharge status: Home / Self Care | Payer: Medicaid Other | Source: Ambulatory Visit | Attending: Gastroenterology | Admitting: Gastroenterology

## 2017-06-03 ENCOUNTER — Ambulatory Visit (HOSPITAL_COMMUNITY): Payer: Medicaid Other | Admitting: Registered Nurse

## 2017-06-03 ENCOUNTER — Telehealth: Payer: Self-pay

## 2017-06-03 ENCOUNTER — Other Ambulatory Visit: Payer: Self-pay

## 2017-06-03 ENCOUNTER — Encounter (HOSPITAL_COMMUNITY)
Admission: RE | Disposition: A | Discharge status: Home / Self Care | Payer: Self-pay | Source: Ambulatory Visit | Attending: Gastroenterology

## 2017-06-03 ENCOUNTER — Encounter (HOSPITAL_COMMUNITY): Payer: Self-pay | Admitting: Emergency Medicine

## 2017-06-03 DIAGNOSIS — I864 Gastric varices: Secondary | ICD-10-CM | POA: Insufficient documentation

## 2017-06-03 DIAGNOSIS — K7460 Unspecified cirrhosis of liver: Secondary | ICD-10-CM | POA: Diagnosis present

## 2017-06-03 DIAGNOSIS — K210 Gastro-esophageal reflux disease with esophagitis: Secondary | ICD-10-CM | POA: Insufficient documentation

## 2017-06-03 DIAGNOSIS — R935 Abnormal findings on diagnostic imaging of other abdominal regions, including retroperitoneum: Secondary | ICD-10-CM

## 2017-06-03 DIAGNOSIS — I8510 Secondary esophageal varices without bleeding: Secondary | ICD-10-CM

## 2017-06-03 DIAGNOSIS — K3189 Other diseases of stomach and duodenum: Secondary | ICD-10-CM | POA: Insufficient documentation

## 2017-06-03 DIAGNOSIS — I8500 Esophageal varices without bleeding: Secondary | ICD-10-CM | POA: Diagnosis not present

## 2017-06-03 DIAGNOSIS — K7290 Hepatic failure, unspecified without coma: Secondary | ICD-10-CM | POA: Diagnosis not present

## 2017-06-03 HISTORY — PX: ESOPHAGOGASTRODUODENOSCOPY (EGD) WITH PROPOFOL: SHX5813

## 2017-06-03 HISTORY — DX: Chronic kidney disease, unspecified: N18.9

## 2017-06-03 SURGERY — ESOPHAGOGASTRODUODENOSCOPY (EGD) WITH PROPOFOL
Anesthesia: Monitor Anesthesia Care

## 2017-06-03 MED ORDER — OMEPRAZOLE 40 MG PO CPDR: 40.0000 mg | DELAYED_RELEASE_CAPSULE | Freq: Every day | ORAL | 11 refills | Status: DC

## 2017-06-03 MED ORDER — SODIUM CHLORIDE 0.9 % IV SOLN: INTRAVENOUS | Status: DC

## 2017-06-03 MED ORDER — LACTATED RINGERS IV SOLN
INTRAVENOUS | Status: DC
  Administered 2017-06-03: 08:00:00 via INTRAVENOUS

## 2017-06-03 MED ORDER — PROPOFOL 10 MG/ML IV BOLUS
INTRAVENOUS | Status: DC | PRN
  Administered 2017-06-03 (×6): 20 mg via INTRAVENOUS

## 2017-06-03 MED ORDER — PROPOFOL 10 MG/ML IV BOLUS
INTRAVENOUS | Status: AC
  Filled 2017-06-03: qty 40

## 2017-06-03 MED ORDER — PROPOFOL 500 MG/50ML IV EMUL
INTRAVENOUS | Status: DC | PRN
  Administered 2017-06-03: 120 ug/kg/min via INTRAVENOUS

## 2017-06-03 MED ORDER — PROPOFOL 10 MG/ML IV BOLUS
INTRAVENOUS | Status: AC
  Filled 2017-06-03: qty 20

## 2017-06-03 MED ORDER — LIDOCAINE 2% (20 MG/ML) 5 ML SYRINGE
INTRAMUSCULAR | Status: DC | PRN
  Administered 2017-06-03: 100 mg via INTRAVENOUS

## 2017-06-03 SURGICAL SUPPLY — 14 items

## 2017-06-03 NOTE — H&P (Signed)
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 In May 2016 he was hospitalized for bleeding gastric varices and hepatic encephalopathy. Gastric varices treated with BRTO. He was not a TIPS candidate at the time due to a high MELD. No esophageal varices on EGD. CT from earlier this year shows distal esophageal varices.  [facility] with Roosevelt Locks, NP.  He has hepatitis C, genotype 1 a, treatment nave and has recently started therapy.  He has had a markedly elevated AFP up to a level of 400 that decreased to 272.  Imaging studies have not shown an hepatic mass.  A 1.4 cm renal lesion consistent with renal cell cancer was documented and he underwent cryoablation therapy in July.  He is referred for EGD for follow-up of gastric varices and imaging studies indicating possible esophageal varices.  Abd MRI 02/2017: 1. Small focus of rounded enhancement in the left renal RFA bed may reflect granulation tissue or residual tumor. Attention on follow-up recommended. 2. No evidence of metastatic disease. 3. Changes of hepatic cirrhosis with probable small transient hepatic intensity differences (incidental vascular phenomena) and sequela of hypertension. No worrisome hepatic findings.  Abd/pelvic CT 05/2016: 1. Stable appearance of hepatic cirrhosis without hepatic mass. Reactive adenopathy and varices in the gastrohepatic ligament and porta hepatis, with uphill varices adjacent to the distal esophagus. No current findings of hepatocellular carcinoma. 2. Mild cardiomegaly with right coronary artery atherosclerotic calcification. Aortoiliac atherosclerotic vascular disease.   Current Medications, Allergies, Past Medical History, Past Surgical History, Family History and Social History were reviewed in [employer] record.  Physical Exam: General: Well developed, well nourished, no  acute distress Head: Normocephalic and atraumatic Eyes:  sclerae anicteric, EOMI Ears: Normal auditory acuity Mouth: No deformity or lesions Lungs: Clear throughout to auscultation Heart: Regular rate and rhythm; no murmurs, rubs or bruits Abdomen: back brace impaired exam Musculoskeletal: Symmetrical with no gross deformities  Pulses:  Normal pulses noted Extremities: No clubbing, cyanosis, edema or deformities noted Neurological: Alert oriented x 4, grossly nonfocal Psychological:  Alert and cooperative. Normal mood and affect  Assessment and Recommendations:  1. Cirrhosis, alcohol and hepatitis C. No alcohol since 2016.  Elevated AFP without a focal hepatic lesion. Follow-up and management per [facility].   2. History of bleeding gastric varices treated with BRTO.  Distal esophageal varices noted on CT imaging.  Schedule EGD with possible banding of esophageal varices if present. The risks (including bleeding, perforation, infection, missed lesions, medication reactions and possible hospitalization or surgery if complications occur), benefits, and alternatives to endoscopy with possible biopsy and possible dilation were discussed with the patient and they consent to proceed.

## 2017-06-03 NOTE — Transfer of Care (Signed)
Immediate Anesthesia Transfer of Care Note  Patient: John [name]  Procedure(s) Performed: ESOPHAGOGASTRODUODENOSCOPY (EGD) WITH PROPOFOL (N/A )  Patient Location: PACU and Endoscopy Unit  Anesthesia Type:MAC  Level of Consciousness: awake, alert , oriented and patient cooperative  Airway & Oxygen Therapy: Patient Spontanous Breathing and Patient connected to face mask oxygen  Post-op Assessment: Report given to RN, Post -op Vital signs reviewed and stable and Patient moving all extremities  Post vital signs: Reviewed and stable  Last Vitals:  Vitals:   06/03/17 0728  BP: 138/88  Pulse: 69  Resp: 14  Temp: 36.9 C  SpO2: 99%    Last Pain:  Vitals:   06/03/17 0728  TempSrc: Oral         Complications: No apparent anesthesia complications

## 2017-06-03 NOTE — Telephone Encounter (Signed)
Prescription for omeprazole sent to patient's pharmacy and informed patient to follow up in one year. Patient verbalized understanding.

## 2017-06-03 NOTE — Op Note (Signed)
[facility] Patient Name: John [name] Procedure Date: 06/03/2017 MRN: [id number] Attending MD: Ladene Artist , MD Date of Birth: 29-Apr-1956 CSN: [id number] Age: 62 Admit Type: Outpatient Procedure:                Upper GI endoscopy Indications:              Cirrhosis with suspected esophageal varices,                            Follow-up of gastric varices Providers:                Pricilla Riffle. Fuller Plan, MD, Carmie End, RN, Laurena Spies, Technician, Cletis Athens, Technician Referring MD:              Medicines:                Monitored Anesthesia Care Complications:            No immediate complications. Estimated Blood Loss:     Estimated blood loss: none. Procedure:                Pre-Anesthesia Assessment:                           - Prior to the procedure, a History and Physical                            was performed, and patient medications and                            allergies were reviewed. The patient's tolerance of                            previous anesthesia was also reviewed. The risks                            and benefits of the procedure and the sedation                            options and risks were discussed with the patient.                            All questions were answered, and informed consent                            was obtained. Prior Anticoagulants: The patient has                            taken no previous anticoagulant or antiplatelet                            agents. ASA Grade Assessment: III - A patient with  severe systemic disease. After reviewing the risks                            and benefits, the patient was deemed in                            satisfactory condition to undergo the procedure.                           After obtaining informed consent, the endoscope was                            passed under direct vision. Throughout the                             procedure, the patient's blood pressure, pulse, and                            oxygen saturations were monitored continuously. The                            Endoscope was introduced through the mouth, and                            advanced to the second part of duodenum. The upper                            GI endoscopy was accomplished without difficulty.                            The patient tolerated the procedure well. Scope In: Scope Out: Findings:      Three columns of non-bleeding grade I varices were found in the mid       esophagus and in the distal esophagus,. They were 4 mm in largest       diameter. No stigmata of recent bleeding were evident and no red wale       signs were present.      LA Grade A (one or more mucosal breaks less than 5 mm, not extending       between tops of 2 mucosal folds) esophagitis with no bleeding was found       in the distal esophagus.      The exam of the esophagus was otherwise normal.      Type 1 isolated gastric varices (IGV1, varices located in the fundus)       with no bleeding were found in the gastric fundus. There were no       stigmata of recent bleeding. They were 4 mm in largest diameter.      A few localized, small non-bleeding erosions were found in the gastric       body. There were no stigmata of recent bleeding.      The exam of the stomach was otherwise normal.      The duodenal bulb and second portion of the duodenum were normal. Impression:               - Non-bleeding grade I  esophageal varices.                           - LA Grade A reflux esophagitis.                           - Type 1 isolated gastric varices (IGV1, varices                            located in the fundus), without bleeding.                           - Non-bleeding erosive gastropathy.                           - Normal duodenal bulb and second portion of the                            duodenum.                           - No specimens  collected. Moderate Sedation:      N/A- Per Anesthesia Care Recommendation:           - Patient has a contact number available for                            emergencies. The signs and symptoms of potential                            delayed complications were discussed with the                            patient. Return to normal activities tomorrow.                            Written discharge instructions were provided to the                            patient.                           - Resume previous diet.                           - Continue present medications.                           - Omeprazole 40 mg po qam, 1 year of refills.                           - Antireflux measures.                           - Repeat upper endoscopy in 1 year.                           -  Return to liver clinic as previously scheduled. Procedure Code(s):        --- Professional ---                           [phone], Esophagogastroduodenoscopy, flexible,                            transoral; diagnostic, including collection of                            specimen(s) by brushing or washing, when performed                            (separate procedure) Diagnosis Code(s):        --- Professional ---                           K74.60, Unspecified cirrhosis of liver                           I85.10, Secondary esophageal varices without                            bleeding                           K21.0, Gastro-esophageal reflux disease with                            esophagitis                           I86.4, Gastric varices                           K31.89, Other diseases of stomach and duodenum CPT copyright 2016 [employer]. All rights reserved. The codes documented in this report are preliminary and upon coder review may  be revised to meet current compliance requirements. Ladene Artist, MD 06/03/2017 8:55:03 AM This report has been signed electronically. Number of Addenda: 0

## 2017-06-03 NOTE — Telephone Encounter (Signed)
-----   Message from Ladene Artist, MD sent at 06/03/2017 10:01 AM EST ----- Please: Send omeprazole 40 mg po qam, 1 year of refills to pharmacy Recommend REV in 1 year See EGD report Call patient with above later today

## 2017-06-03 NOTE — Discharge Instructions (Signed)
YOU HAD AN ENDOSCOPIC PROCEDURE TODAY: Refer to the procedure report and other information in the discharge instructions given to you for any specific questions about what was found during the examination. If this information does not answer your questions, please call [place] office at [phone] to clarify.   YOU SHOULD EXPECT: Some feelings of bloating in the abdomen. Passage of more gas than usual. Walking can help get rid of the air that was put into your GI tract during the procedure and reduce the bloating. If you had a lower endoscopy (such as a colonoscopy or flexible sigmoidoscopy) you may notice spotting of blood in your stool or on the toilet paper. Some abdominal soreness may be present for a day or two, also.  DIET: Your first meal following the procedure should be a light meal and then it is ok to progress to your normal diet. A half-sandwich or bowl of soup is an example of a good first meal. Heavy or fried foods are harder to digest and may make you feel nauseous or bloated. Drink plenty of fluids but you should avoid alcoholic beverages for 24 hours. If you had a esophageal dilation, please see attached instructions for diet.    ACTIVITY: Your care partner should take you home directly after the procedure. You should plan to take it easy, moving slowly for the rest of the day. You can resume normal activity the day after the procedure however YOU SHOULD NOT DRIVE, use power tools, machinery or perform tasks that involve climbing or major physical exertion for 24 hours (because of the sedation medicines used during the test).   SYMPTOMS TO REPORT IMMEDIATELY: A gastroenterologist can be reached at any hour. Please call [phone]  for any of the following symptoms:   Following upper endoscopy (EGD, EUS, ERCP, esophageal dilation) Vomiting of blood or coffee ground material  New, significant abdominal pain  New, significant chest pain or pain under the shoulder blades  Painful or  persistently difficult swallowing  New shortness of breath  Black, tarry-looking or red, bloody stools  FOLLOW UP:  If any biopsies were taken you will be contacted by phone or by letter within the next 1-3 weeks. Call [phone]  if you have not heard about the biopsies in 3 weeks.  Please also call with any specific questions about appointments or follow up tests.  

## 2017-06-03 NOTE — Anesthesia Postprocedure Evaluation (Signed)
Anesthesia Post Note  Patient: Neel Buffone  Procedure(s) Performed: ESOPHAGOGASTRODUODENOSCOPY (EGD) WITH PROPOFOL (N/A )     Patient location during evaluation: Endoscopy Anesthesia Type: MAC Level of consciousness: awake Pain management: pain level controlled Vital Signs Assessment: post-procedure vital signs reviewed and stable Respiratory status: spontaneous breathing Cardiovascular status: stable Postop Assessment: no apparent nausea or vomiting Anesthetic complications: no    Last Vitals:  Vitals:   06/03/17 0910 06/03/17 0920  BP: 134/90 (!) 146/97  Pulse: 61 65  Resp: 14 14  Temp:    SpO2: 98% 98%    Last Pain:  Vitals:   06/03/17 0854  TempSrc: Oral   Pain Goal:                 Kaiyu Mirabal JR,JOHN Kate Larock

## 2017-06-03 NOTE — Anesthesia Preprocedure Evaluation (Addendum)
Anesthesia Evaluation  Patient identified by MRN, date of birth, ID band Patient awake    Reviewed: Allergy & Precautions, NPO status , Patient's Chart, lab work & pertinent test results  Airway Mallampati: I       Dental  (+) Poor Dentition, Dental Advisory Given,    Pulmonary former smoker,    Pulmonary exam normal breath sounds clear to auscultation       Cardiovascular hypertension, Pt. on medications Normal cardiovascular exam Rhythm:Regular Rate:Normal     Neuro/Psych    GI/Hepatic   Endo/Other    Renal/GU      Musculoskeletal   Abdominal Normal abdominal exam  (+)   Peds  Hematology   Anesthesia Other Findings   Reproductive/Obstetrics                            Anesthesia Physical Anesthesia Plan  ASA: II  Anesthesia Plan: MAC   Post-op Pain Management:    Induction:   PONV Risk Score and Plan:   Airway Management Planned: Natural Airway, Nasal Cannula and Simple Face Mask  Additional Equipment:   Intra-op Plan:   Post-operative Plan:   Informed Consent: I have reviewed the patients History and Physical, chart, labs and discussed the procedure including the risks, benefits and alternatives for the proposed anesthesia with the patient or authorized representative who has indicated his/her understanding and acceptance.     Plan Discussed with: CRNA and Surgeon  Anesthesia Plan Comments:         Anesthesia Quick Evaluation

## 2017-09-12 ENCOUNTER — Other Ambulatory Visit: Payer: Self-pay | Admitting: Nurse Practitioner

## 2017-09-12 DIAGNOSIS — K7469 Other cirrhosis of liver: Secondary | ICD-10-CM

## 2017-09-24 ENCOUNTER — Ambulatory Visit
Admission: RE | Admit: 2017-09-24 | Discharge: 2017-09-24 | Disposition: A | Discharge status: Home / Self Care | Payer: Medicaid Other | Source: Ambulatory Visit | Attending: Nurse Practitioner | Admitting: Nurse Practitioner

## 2017-09-24 DIAGNOSIS — K7469 Other cirrhosis of liver: Secondary | ICD-10-CM

## 2017-10-06 ENCOUNTER — Ambulatory Visit: Payer: Medicaid Other | Admitting: Podiatry

## 2017-10-06 DIAGNOSIS — M79676 Pain in unspecified toe(s): Secondary | ICD-10-CM | POA: Diagnosis not present

## 2017-10-06 DIAGNOSIS — B351 Tinea unguium: Secondary | ICD-10-CM | POA: Diagnosis not present

## 2017-10-07 NOTE — Progress Notes (Signed)
   SUBJECTIVE Patient presents to office today complaining of elongated, thickened bilateral great toenails that cause pain while ambulating in shoes. He is unable to trim his own nails. Patient is here for further evaluation and treatment.  Past Medical History:  Diagnosis Date  . Ascites   . CHF (congestive heart failure) ([place])   . Chronic back pain   . Chronic kidney disease   . Cirrhosis ([place])   . Dyspnea    walking   . ETOH abuse   . GERD (gastroesophageal reflux disease)   . Hepatitis   . History of blood transfusion   . History of bronchitis   . Hypertension   . Optic neuropathy, left   . Polysubstance abuse ([place])   . Seizures [facility])    age 62  . Stroke [facility]) [place] Patient is awake, alert, and oriented x 3 and in no acute distress. Derm Skin is dry and supple bilateral. Negative open lesions or macerations. Remaining integument unremarkable. Bilateral great toenails are tender, long, thickened and dystrophic with subungual debris, consistent with onychomycosis. No signs of infection noted. Vasc  DP and PT pedal pulses palpable bilaterally. Temperature gradient within normal limits.  Neuro Epicritic and protective threshold sensation grossly intact bilaterally.  Musculoskeletal Exam No symptomatic pedal deformities noted bilateral. Muscular strength within normal limits.  ASSESSMENT 1. Onychodystrophic great toenails bilateral with hyperkeratosis of nails.  2. Onychomycosis of nail due to dermatophyte bilateral  PLAN OF CARE 1. Patient evaluated today.  2. Instructed to maintain good pedal hygiene and foot care.  3. Mechanical debridement of great toenails bilaterally performed using a nail nipper. Filed with dremel without incident.  4. Return to clinic as needed.    Edrick Kins, DPM [facility]  Dr. Edrick Kins, [place]                                        [place], [place] [address]                Office  [phone]  Fax [phone]

## 2017-12-03 ENCOUNTER — Other Ambulatory Visit (HOSPITAL_COMMUNITY): Payer: Self-pay | Admitting: Nurse Practitioner

## 2017-12-03 DIAGNOSIS — K7460 Unspecified cirrhosis of liver: Secondary | ICD-10-CM

## 2017-12-03 DIAGNOSIS — R188 Other ascites: Principal | ICD-10-CM

## 2017-12-05 ENCOUNTER — Ambulatory Visit (HOSPITAL_COMMUNITY)
Admission: RE | Admit: 2017-12-05 | Discharge: 2017-12-05 | Disposition: A | Discharge status: Home / Self Care | Payer: Medicaid Other | Source: Ambulatory Visit | Attending: Nurse Practitioner | Admitting: Nurse Practitioner

## 2017-12-05 DIAGNOSIS — K7460 Unspecified cirrhosis of liver: Secondary | ICD-10-CM

## 2017-12-05 DIAGNOSIS — K7469 Other cirrhosis of liver: Secondary | ICD-10-CM | POA: Insufficient documentation

## 2017-12-05 DIAGNOSIS — R188 Other ascites: Secondary | ICD-10-CM

## 2018-01-12 DIAGNOSIS — Z85528 Personal history of other malignant neoplasm of kidney: Secondary | ICD-10-CM | POA: Insufficient documentation

## 2018-02-03 ENCOUNTER — Encounter: Payer: Self-pay | Admitting: Gastroenterology

## 2018-02-05 ENCOUNTER — Other Ambulatory Visit: Payer: Self-pay | Admitting: Radiology

## 2018-02-05 ENCOUNTER — Encounter: Payer: Self-pay | Admitting: Radiology

## 2018-02-05 ENCOUNTER — Other Ambulatory Visit (HOSPITAL_COMMUNITY): Payer: Self-pay | Admitting: Interventional Radiology

## 2018-02-05 DIAGNOSIS — C642 Malignant neoplasm of left kidney, except renal pelvis: Secondary | ICD-10-CM

## 2018-02-17 ENCOUNTER — Ambulatory Visit (AMBULATORY_SURGERY_CENTER): Payer: Self-pay

## 2018-02-17 VITALS — Ht 70.0 in | Wt 252.2 lb

## 2018-02-17 DIAGNOSIS — Z1211 Encounter for screening for malignant neoplasm of colon: Secondary | ICD-10-CM

## 2018-02-17 MED ORDER — NA SULFATE-K SULFATE-MG SULF 17.5-3.13-1.6 GM/177ML PO SOLN: 1.0000 | Freq: Once | ORAL | 0 refills | Status: AC

## 2018-02-17 NOTE — Progress Notes (Signed)
Denies allergies to eggs or soy products. Denies complication of anesthesia or sedation. Denies use of weight loss medication. Denies use of O2.   Emmi instructions declined.   Patient is a very poor historian. It was difficult to determine his medical history and medications.

## 2018-03-02 ENCOUNTER — Encounter: Payer: Self-pay | Admitting: Gastroenterology

## 2018-03-02 ENCOUNTER — Ambulatory Visit (AMBULATORY_SURGERY_CENTER): Payer: Medicaid Other | Admitting: Gastroenterology

## 2018-03-02 VITALS — BP 107/58 | HR 71 | Temp 99.8°F | Resp 13 | Ht 68.0 in | Wt 222.0 lb

## 2018-03-02 DIAGNOSIS — Z1211 Encounter for screening for malignant neoplasm of colon: Secondary | ICD-10-CM | POA: Diagnosis not present

## 2018-03-02 DIAGNOSIS — D125 Benign neoplasm of sigmoid colon: Secondary | ICD-10-CM

## 2018-03-02 DIAGNOSIS — D124 Benign neoplasm of descending colon: Secondary | ICD-10-CM | POA: Diagnosis not present

## 2018-03-02 DIAGNOSIS — D123 Benign neoplasm of transverse colon: Secondary | ICD-10-CM | POA: Diagnosis not present

## 2018-03-02 MED ORDER — SODIUM CHLORIDE 0.9 % IV SOLN: 500.0000 mL | Freq: Once | INTRAVENOUS | Status: DC

## 2018-03-02 NOTE — Progress Notes (Signed)
Called to room to assist during endoscopic procedure.  Patient ID and intended procedure confirmed with present staff. Received instructions for my participation in the procedure from the performing physician.  

## 2018-03-02 NOTE — Op Note (Signed)
[facility] Patient Name: John [name] Procedure Date: 03/02/2018 1:46 PM MRN: [id number] Endoscopist: Ladene Artist , MD Age: 62 Referring MD:  Date of Birth: Apr 17, 1956 Gender: Male Account #: [id number] Procedure:                Colonoscopy Indications:              Screening for colorectal malignant neoplasm Medicines:                Monitored Anesthesia Care Procedure:                Pre-Anesthesia Assessment:                           - Prior to the procedure, a History and Physical                            was performed, and patient medications and                            allergies were reviewed. The patient's tolerance of                            previous anesthesia was also reviewed. The risks                            and benefits of the procedure and the sedation                            options and risks were discussed with the patient.                            All questions were answered, and informed consent                            was obtained. Prior Anticoagulants: The patient has                            taken no previous anticoagulant or antiplatelet                            agents. ASA Grade Assessment: III - A patient with                            severe systemic disease. After reviewing the risks                            and benefits, the patient was deemed in                            satisfactory condition to undergo the procedure.                           After obtaining informed consent, the colonoscope  was passed under direct vision. Throughout the                            procedure, the patient's blood pressure, pulse, and                            oxygen saturations were monitored continuously. The                            Colonoscope was introduced through the anus and                            advanced to the the cecum, identified by                            appendiceal orifice  and ileocecal valve. The                            ileocecal valve, appendiceal orifice, and rectum                            were photographed. The quality of the bowel                            preparation was excellent. The colonoscopy was                            performed without difficulty. The patient tolerated                            the procedure well. Scope In: 1:59:46 PM Scope Out: 2:19:15 PM Scope Withdrawal Time: 0 hours 16 minutes 49 seconds  Total Procedure Duration: 0 hours 19 minutes 29 seconds  Findings:                 The perianal and digital rectal examinations were                            normal.                           A 5 mm polyp was found in the transverse colon. The                            polyp was sessile. The polyp was removed with a                            cold biopsy forceps. Resection and retrieval were                            complete.                           Four sessile polyps were found in the sigmoid colon                            (  2), descending colon (1) and transverse colon (1).                            The polyps were 7 to 8 mm in size. These polyps                            were removed with a cold snare. Resection and                            retrieval were complete.                           There was a medium-sized lipoma, 15 mm in diameter,                            in the transverse colon.                           Internal hemorrhoids were found during                            retroflexion. The hemorrhoids were small and Grade                            I (internal hemorrhoids that do not prolapse).                           The exam was otherwise without abnormality on                            direct and retroflexion views. Complications:            No immediate complications. Estimated blood loss:                            None. Estimated Blood Loss:     Estimated blood loss: none. Impression:                - One 5 mm polyp in the transverse colon, removed                            with a cold biopsy forceps. Resected and retrieved.                           - Four 7 to 8 mm polyps in the sigmoid colon, in                            the descending colon and in the transverse colon,                            removed with a cold snare. Resected and retrieved.                           - Lipoma in transverse colon.                           -  Internal hemorrhoids.                           - The examination was otherwise normal on direct                            and retroflexion views. Recommendation:           - Repeat colonoscopy date to be determined after                            pending pathology results are reviewed for                            surveillance.                           - Patient has a contact number available for                            emergencies. The signs and symptoms of potential                            delayed complications were discussed with the                            patient. Return to normal activities tomorrow.                            Written discharge instructions were provided to the                            patient.                           - Resume previous diet.                           - Continue present medications.                           - Await pathology results.                           - No aspirin, ibuprofen, naproxen, or other                            non-steroidal anti-inflammatory drugs for 2 weeks                            after polyp removal. Ladene Artist, MD 03/02/2018 2:24:36 PM This report has been signed electronically.

## 2018-03-02 NOTE — Patient Instructions (Signed)
*   handout on polyps given, no aspirin, ibuprofen, naproxen, or other non steroidal anti inflammatory drugs for two weeks.   YOU HAD AN ENDOSCOPIC PROCEDURE TODAY AT [facility]:   Refer to the procedure report that was given to you for any specific questions about what was found during the examination.  If the procedure report does not answer your questions, please call your gastroenterologist to clarify.  If you requested that your care partner not be given the details of your procedure findings, then the procedure report has been included in a sealed envelope for you to review at your convenience later.  YOU SHOULD EXPECT: Some feelings of bloating in the abdomen. Passage of more gas than usual.  Walking can help get rid of the air that was put into your GI tract during the procedure and reduce the bloating. If you had a lower endoscopy (such as a colonoscopy or flexible sigmoidoscopy) you may notice spotting of blood in your stool or on the toilet paper. If you underwent a bowel prep for your procedure, you may not have a normal bowel movement for a few days.  Please Note:  You might notice some irritation and congestion in your nose or some drainage.  This is from the oxygen used during your procedure.  There is no need for concern and it should clear up in a day or so.  SYMPTOMS TO REPORT IMMEDIATELY:   Following lower endoscopy (colonoscopy or flexible sigmoidoscopy):  Excessive amounts of blood in the stool  Significant tenderness or worsening of abdominal pains  Swelling of the abdomen that is new, acute  Fever of 100F or higher   For urgent or emergent issues, a gastroenterologist can be reached at any hour by calling [phone].   DIET:  We do recommend a small meal at first, but then you may proceed to your regular diet.  Drink plenty of fluids but you should avoid alcoholic beverages for 24 hours.  ACTIVITY:  You should plan to take it easy for the rest of  today and you should NOT DRIVE or use heavy machinery until tomorrow (because of the sedation medicines used during the test).    FOLLOW UP: Our staff will call the number listed on your records the next business day following your procedure to check on you and address any questions or concerns that you may have regarding the information given to you following your procedure. If we do not reach you, we will leave a message.  However, if you are feeling well and you are not experiencing any problems, there is no need to return our call.  We will assume that you have returned to your regular daily activities without incident.  If any biopsies were taken you will be contacted by phone or by letter within the next 1-3 weeks.  Please call us at [phone] if you have not heard about the biopsies in 3 weeks.    SIGNATURES/CONFIDENTIALITY: You and/or your care partner have signed paperwork which will be entered into your electronic medical record.  These signatures attest to the fact that that the information above on your After Visit Summary has been reviewed and is understood.  Full responsibility of the confidentiality of this discharge information lies with you and/or your care-partner.

## 2018-03-02 NOTE — Progress Notes (Signed)
Report to PACU, RN, vss, BBS= Clear.  

## 2018-03-03 ENCOUNTER — Telehealth: Payer: Self-pay | Admitting: *Deleted

## 2018-03-03 ENCOUNTER — Telehealth: Payer: Self-pay

## 2018-03-03 NOTE — Telephone Encounter (Signed)
Called [phone] and left a messaged we tried to reach pt for a follow up call. maw

## 2018-03-03 NOTE — Telephone Encounter (Signed)
  Follow up Call-  Call back number 03/02/2018  Post procedure Call Back phone  # [phone]  Permission to leave phone message Yes  Some recent data might be hidden     Patient questions:  Do you have a fever, pain , or abdominal swelling? No. Pain Score  0 *  Have you tolerated food without any problems? Yes.    Have you been able to return to your normal activities? Yes.    Do you have any questions about your discharge instructions: Diet   No. Medications  No. Follow up visit  No.  Do you have questions or concerns about your Care? No.  Actions: * If pain score is 4 or above: No action needed, pain <4.

## 2018-03-11 ENCOUNTER — Ambulatory Visit (HOSPITAL_COMMUNITY)
Admission: RE | Admit: 2018-03-11 | Discharge: 2018-03-11 | Disposition: A | Discharge status: Home / Self Care | Payer: Medicaid Other | Source: Ambulatory Visit | Attending: Interventional Radiology | Admitting: Interventional Radiology

## 2018-03-11 ENCOUNTER — Other Ambulatory Visit: Payer: Medicaid Other

## 2018-03-24 ENCOUNTER — Encounter: Payer: Self-pay | Admitting: Gastroenterology

## 2018-03-25 ENCOUNTER — Ambulatory Visit (HOSPITAL_COMMUNITY)
Admission: RE | Admit: 2018-03-25 | Discharge: 2018-03-25 | Disposition: A | Discharge status: Home / Self Care | Payer: Medicaid Other | Source: Ambulatory Visit | Attending: Interventional Radiology | Admitting: Interventional Radiology

## 2018-03-25 ENCOUNTER — Ambulatory Visit
Admission: RE | Admit: 2018-03-25 | Discharge: 2018-03-25 | Disposition: A | Discharge status: Home / Self Care | Payer: Medicaid Other | Source: Ambulatory Visit | Attending: Interventional Radiology | Admitting: Interventional Radiology

## 2018-03-25 DIAGNOSIS — C642 Malignant neoplasm of left kidney, except renal pelvis: Secondary | ICD-10-CM | POA: Insufficient documentation

## 2018-03-25 DIAGNOSIS — K7460 Unspecified cirrhosis of liver: Secondary | ICD-10-CM | POA: Insufficient documentation

## 2018-03-25 DIAGNOSIS — K760 Fatty (change of) liver, not elsewhere classified: Secondary | ICD-10-CM | POA: Insufficient documentation

## 2018-03-25 HISTORY — PX: IR RADIOLOGIST EVAL & MGMT: IMG5224

## 2018-03-25 LAB — POCT I-STAT CREATININE: Creatinine, Ser: 1.7 mg/dL — ABNORMAL HIGH (ref 0.61–1.24)

## 2018-03-25 MED ORDER — GADOBUTROL 1 MMOL/ML IV SOLN
10.0000 mL | Freq: Once | INTRAVENOUS | Status: AC | PRN
  Administered 2018-03-25: 10 mL via INTRAVENOUS

## 2018-03-25 NOTE — Progress Notes (Signed)
Patient ID: John [name], male   DOB: 1955/09/10, 62 y.o.   MRN: [id number]       Chief Complaint:  1 year 48-month status post left renal cryoablation  Referring Physician(s): Ilissa Rosner,Sevin  History of Present Illness: John [name] is a 62 y.o. male with a prior history of chronic renal insufficiency, hepatitis C, cirrhosis and portal hypertension.  Imaging revealed a incidental solid enhancing left renal mass by CT and MRI concerning for renal cell carcinoma.  He underwent successful image guided cryoablation at [facility] approximately 15 months ago.  No new issues over the last year.  He is in his usual physical state.  No current abdominal pain, flank pain, dysuria hematuria.  No fevers.  He returns for outpatient surveillance imaging and follow-up.  Past Medical History:  Diagnosis Date  . Arthritis   . Ascites   . Cataract   . CHF (congestive heart failure) ([place])   . Chronic back pain   . Chronic kidney disease   . Cirrhosis ([place])   . Dyspnea    walking   . ETOH abuse   . GERD (gastroesophageal reflux disease)   . Hepatitis   . History of blood transfusion   . History of bronchitis   . Hypertension   . Neuromuscular disorder ([place])   . Optic neuropathy, left   . Polysubstance abuse ([place])   . Seizures [facility]    age 59  . Sickle cell trait ([place])   . Stroke [facility]) 1998    Past Surgical History:  Procedure Laterality Date  . ESOPHAGOGASTRODUODENOSCOPY N/A 08/14/2014   Procedure: ESOPHAGOGASTRODUODENOSCOPY (EGD);  Surgeon: Ladene Artist, MD;  Location: [facility];  Service: Endoscopy;  Laterality: N/A;  . ESOPHAGOGASTRODUODENOSCOPY (EGD) WITH PROPOFOL N/A 06/03/2017   Procedure: ESOPHAGOGASTRODUODENOSCOPY (EGD) WITH PROPOFOL;  Surgeon: Ladene Artist, MD;  Location: [facility];  Service: Endoscopy;  Laterality: N/A;  . IR RADIOLOGIST EVAL & MGMT  09/25/2016  . IR RADIOLOGIST EVAL & MGMT  12/10/2016  . IR RADIOLOGIST EVAL & MGMT  03/25/2018  . none      . RADIOFREQUENCY ABLATION Left 11/08/2016   Procedure: LEFT RENAL CRYOABLATION;  Surgeon: Greggory Keen, MD;  Location: [facility] OR[facility];  Service: Anesthesiology;  Laterality: Left;  . RADIOLOGY WITH ANESTHESIA N/A 08/15/2014   Procedure: RADIOLOGY WITH ANESTHESIA;  Surgeon: Greggory Keen, MD;  Location: [facility];  Service: Radiology;  Laterality: N/A;    Allergies: Penicillins  Medications: Prior to Admission medications   Medication Sig Start Date End Date Taking? Authorizing Provider  amLODipine (NORVASC) 10 MG tablet Take 1 tablet (10 mg total) by mouth daily. 05/25/15  Yes Debbe Odea, MD  clobetasol (TEMOVATE) 0.05 % external solution 1 (one) Application apply to the scalp and spots on body twice daily for two weeks 10/01/17  Yes [provider]  famotidine (PEPCID) 40 MG tablet  07/25/17  Yes [provider]  furosemide (LASIX) 40 MG tablet Take 40 mg by mouth daily.   Yes [provider]  hydrALAZINE (APRESOLINE) 50 MG tablet Take 1 tablet (50 mg total) by mouth 3 (three) times daily. 05/25/15  Yes Debbe Odea, MD  HYDROcodone-acetaminophen (NORCO/VICODIN) 5-325 MG tablet Take 1 tablet by mouth 3 (three) times daily. 02/20/18  Yes [provider]  ketoconazole (NIZORAL) 2 % cream APPLY TO SCALP, face AND bofy three times WEEKLY 09/30/17  Yes [provider]  lactulose (CHRONULAC) 10 GM/15ML solution Take 75ml by mouth once daily as needed for constipation 11/01/16  Yes [provider]  methocarbamol (ROBAXIN) 500 MG tablet TAKE ONE TABLET UP TO 4 TIMES A DAY AS NEEDED FOR BACK PAIN. 01/29/18  Yes [provider]  omeprazole (PRILOSEC) 40 MG capsule Take 1 capsule (40 mg total) by mouth daily. 06/03/17  Yes Ladene Artist, MD  PROAIR HFA 108 [phone] Base) MCG/ACT inhaler INHALE 1-2 puffs EVERY 4-6 HOURS 10/02/17  Yes [provider]  spironolactone (ALDACTONE) 25 MG tablet TAKE ONE TABLET BY MOUTH DAILY FOR 30 DAYS 08/30/17  Yes [provider]     Family History  Problem Relation Age of Onset  . Hypertension Mother        Living  . Kidney disease Mother   . Diabetes Mother   . Heart disease Mother   . Hypertension Father        Deceased, 85  . Ulcers Father   . Stomach cancer Father   . Hypertension Brother   . Diabetes Brother   . Kidney disease Brother   . Hypertension Sister   . Colon cancer Neg Hx   . Esophageal cancer Neg Hx   . Rectal cancer Neg Hx     Social History   Socioeconomic History  . Marital status: Single    Spouse name: Not on file  . Number of children: 0  . Years of education: Not on file  . Highest education level: Not on file  Occupational History  . Occupation: retired  Scientific laboratory technician  . Financial resource strain: Not on file  . Food insecurity:    Worry: Not on file    Inability: Not on file  . Transportation needs:    Medical: Not on file    Non-medical: Not on file  Tobacco Use  . Smoking status: Former Smoker    Packs/day: 0.30    Years: 20.00    Pack years: 6.00    Types: Cigarettes  . Smokeless tobacco: Never Used  . Tobacco comment: 2015  Substance and Sexual Activity  . Alcohol use: No    Alcohol/week: 0.0 standard drinks  . Drug use: No    Types: Cocaine    Comment: since fall 2015  . Sexual activity: Not on file  Lifestyle  . Physical activity:    Days per week: Not on file    Minutes per session: Not on file  . Stress: Not on file  Relationships  . Social connections:    Talks on phone: Not on file    Gets together: Not on file    Attends religious service: Not on file    Active member of club or organization: Not on file    Attends meetings of clubs or organizations: Not on file    Relationship status: Not on file  Other Topics Concern  . Not on file  Social History Narrative   Lives with niece in a 2 story home.     On disability since 2015 for low back pain.  Used to work Statistician.    Highest level of education: 11th grade     ECOG Status: 0 - Asymptomatic  Review of Systems: A 12 point ROS discussed and pertinent positives are indicated in the HPI above.  All other systems are negative.  Review of Systems  Vital Signs: BP 139/78 (BP Location: Right Arm, Patient Position: Sitting, Cuff Size: Normal)   Pulse 75   Temp 98.1 F (36.7 C)   Resp 20   SpO2 98%   Physical Exam  Constitutional: He is oriented to person, place, and time. He appears well-developed and well-nourished. No distress.  Eyes: Conjunctivae are normal. No scleral icterus.  Cardiovascular: Normal rate and regular rhythm.  Pulmonary/Chest: Effort normal and breath sounds normal.  Abdominal: Soft. Bowel sounds are normal. There is no tenderness.  Neurological: He is alert and oriented to person, place, and time.  Skin: He is not diaphoretic.  Psychiatric: He has a normal mood and affect. His behavior is normal.    Imaging: Mr Abdomen Wwo Contrast  Result Date: 03/25/2018 CLINICAL DATA:  62 year old male with history of cryoablation for left-sided renal cell carcinoma. Follow-up study. EXAM: MRI ABDOMEN WITHOUT AND WITH CONTRAST TECHNIQUE: Multiplanar multisequence MR imaging of the abdomen was performed both before and after the administration of intravenous contrast. CONTRAST:  10 mL of Gadavist. COMPARISON:  Abdominal MRI 03/11/2017. FINDINGS: Lower chest: Unremarkable. Hepatobiliary: Liver has a shrunken appearance and nodular contour, indicative of advanced cirrhosis. Diffuse loss of signal intensity on out of phase dual echo images, indicative of a background of hepatic steatosis. No suspicious cystic or solid hepatic lesions. No intra or extrahepatic biliary ductal dilatation. Gallbladder is normal in appearance. Pancreas: No pancreatic mass. No pancreatic ductal dilatation. No pancreatic or peripancreatic fluid or inflammatory changes. Spleen:  Unremarkable. Adrenals/Urinary Tract: In the lateral aspect of the upper pole of the left  kidney in the area of the previously noted cryoablation there is a 1.9 x 1.2 cm area (axial image 24 of series 4) which is T1 isointense to slightly hypointense, mildly T2 hyperintense and demonstrates heterogeneous enhancement on post gadolinium imaging. This appears grossly stable in size or slightly smaller when compared to the prior study from 03/11/2017. No overt enlarging nodular area of hyperenhancement adjacent to this to strongly suggest locally recurrent disease. Right kidney and bilateral adrenal glands are normal in appearance. No hydroureteronephrosis in the visualized portions of the abdomen. Stomach/Bowel: Visualized portions are unremarkable. Vascular/Lymphatic: No aneurysm identified in the visualized abdominal vasculature. Numerous portosystemic collateral pathways are noted, including large proximal gastric varices. No lymphadenopathy noted in the abdomen. Other: No significant volume of ascites in the visualized portions of the peritoneal cavity. Musculoskeletal: No aggressive appearing osseous lesions are noted in the visualized portions of the skeleton. IMPRESSION: 1. Similar appearance of the area of prior cryoablation in the upper pole of the left kidney laterally. Although the appearance is unusual for an area of ablation, the fact that this area is stable to slightly smaller in size than the prior study suggests that these findings reflect post ablation scarring and granulation tissue rather than locally recurrent disease. Continued attention on follow-up studies is recommended to ensure continued stability. 2. Advanced cirrhosis with hepatic steatosis. No suspicious hepatic lesions are noted at this time. 3. Additional incidental findings, as above. Electronically Signed   By: Vinnie Langton M.D.   On: 03/25/2018 12:16   Ir Radiologist Eval & Mgmt  Result Date: 03/25/2018 Please refer to notes tab for details about interventional procedure. (Op Note)   Labs:  CBC: No results  for input(s): WBC, HGB, HCT, PLT in the last 8760 hours.  COAGS: No results for input(s): INR, APTT in the last 8760 hours.  BMP: Recent Labs    03/25/18 1028  CREATININE 1.70*    LIVER FUNCTION TESTS: No results for input(s): BILITOT, AST, ALT, ALKPHOS, PROT, ALBUMIN in the last 8760 hours.   Assessment and Plan:  1 year 16-month status post CT-guided ablation of a 1.4 cm solid  enhancing left renal mass compatible with a renal cell carcinoma.  He is recovered with no new renal issues.  MRI demonstrates a stable ablation defect with some mild peripheral enhancement remain indeterminate for residual granulation tissue.  This continues to show slight retraction.  No new renal abnormality.  Plan: Continue surveillance at 12 months with a repeat MRI.  Thank you for this interesting consult.  I greatly enjoyed meeting John [name] and look forward to participating in their care.  A copy of this report was sent to the requesting provider on this date.  Electronically Signed: Greggory Keen 03/25/2018, 2:31 PM   I spent a total of    25 Minutes in face to face in clinical consultation, greater than 50% of which was counseling/coordinating care for this patient with a left renal mass status post ablation

## 2018-05-03 ENCOUNTER — Encounter (HOSPITAL_COMMUNITY): Payer: Self-pay

## 2018-05-03 ENCOUNTER — Inpatient Hospital Stay (HOSPITAL_COMMUNITY)
Admission: EM | Admit: 2018-05-03 | Discharge: 2018-05-06 | DRG: 554 | Disposition: A | Discharge status: Home Health | Payer: Medicaid Other | Attending: Internal Medicine | Admitting: Internal Medicine

## 2018-05-03 ENCOUNTER — Emergency Department (HOSPITAL_COMMUNITY): Payer: Medicaid Other

## 2018-05-03 DIAGNOSIS — Z8673 Personal history of transient ischemic attack (TIA), and cerebral infarction without residual deficits: Secondary | ICD-10-CM | POA: Diagnosis not present

## 2018-05-03 DIAGNOSIS — D573 Sickle-cell trait: Secondary | ICD-10-CM | POA: Diagnosis present

## 2018-05-03 DIAGNOSIS — B182 Chronic viral hepatitis C: Secondary | ICD-10-CM | POA: Diagnosis present

## 2018-05-03 DIAGNOSIS — E876 Hypokalemia: Secondary | ICD-10-CM | POA: Diagnosis present

## 2018-05-03 DIAGNOSIS — M109 Gout, unspecified: Secondary | ICD-10-CM | POA: Diagnosis present

## 2018-05-03 DIAGNOSIS — F1011 Alcohol abuse, in remission: Secondary | ICD-10-CM | POA: Diagnosis present

## 2018-05-03 DIAGNOSIS — Z8249 Family history of ischemic heart disease and other diseases of the circulatory system: Secondary | ICD-10-CM | POA: Diagnosis not present

## 2018-05-03 DIAGNOSIS — Z800 Family history of malignant neoplasm of digestive organs: Secondary | ICD-10-CM

## 2018-05-03 DIAGNOSIS — E860 Dehydration: Secondary | ICD-10-CM | POA: Diagnosis present

## 2018-05-03 DIAGNOSIS — Z79899 Other long term (current) drug therapy: Secondary | ICD-10-CM | POA: Diagnosis not present

## 2018-05-03 DIAGNOSIS — G8929 Other chronic pain: Secondary | ICD-10-CM | POA: Diagnosis present

## 2018-05-03 DIAGNOSIS — Z87891 Personal history of nicotine dependence: Secondary | ICD-10-CM | POA: Diagnosis not present

## 2018-05-03 DIAGNOSIS — I130 Hypertensive heart and chronic kidney disease with heart failure and stage 1 through stage 4 chronic kidney disease, or unspecified chronic kidney disease: Secondary | ICD-10-CM | POA: Diagnosis present

## 2018-05-03 DIAGNOSIS — I10 Essential (primary) hypertension: Secondary | ICD-10-CM | POA: Diagnosis not present

## 2018-05-03 DIAGNOSIS — Z8701 Personal history of pneumonia (recurrent): Secondary | ICD-10-CM

## 2018-05-03 DIAGNOSIS — K219 Gastro-esophageal reflux disease without esophagitis: Secondary | ICD-10-CM | POA: Diagnosis present

## 2018-05-03 DIAGNOSIS — Z880 Allergy status to penicillin: Secondary | ICD-10-CM | POA: Diagnosis not present

## 2018-05-03 DIAGNOSIS — N182 Chronic kidney disease, stage 2 (mild): Secondary | ICD-10-CM | POA: Diagnosis present

## 2018-05-03 DIAGNOSIS — M2550 Pain in unspecified joint: Secondary | ICD-10-CM | POA: Diagnosis present

## 2018-05-03 DIAGNOSIS — R509 Fever, unspecified: Secondary | ICD-10-CM | POA: Diagnosis present

## 2018-05-03 DIAGNOSIS — N1832 Chronic kidney disease, stage 3b: Secondary | ICD-10-CM | POA: Diagnosis present

## 2018-05-03 DIAGNOSIS — M1990 Unspecified osteoarthritis, unspecified site: Secondary | ICD-10-CM | POA: Diagnosis present

## 2018-05-03 DIAGNOSIS — I5032 Chronic diastolic (congestive) heart failure: Secondary | ICD-10-CM | POA: Diagnosis present

## 2018-05-03 DIAGNOSIS — K7031 Alcoholic cirrhosis of liver with ascites: Secondary | ICD-10-CM | POA: Diagnosis present

## 2018-05-03 DIAGNOSIS — Z841 Family history of disorders of kidney and ureter: Secondary | ICD-10-CM

## 2018-05-03 DIAGNOSIS — M549 Dorsalgia, unspecified: Secondary | ICD-10-CM | POA: Diagnosis present

## 2018-05-03 LAB — URINALYSIS, ROUTINE W REFLEX MICROSCOPIC
Bacteria, UA: NONE SEEN
Bilirubin Urine: NEGATIVE
Glucose, UA: NEGATIVE mg/dL
Ketones, ur: NEGATIVE mg/dL
Leukocytes, UA: NEGATIVE
Nitrite: NEGATIVE
Protein, ur: NEGATIVE mg/dL
SPECIFIC GRAVITY, URINE: 1.012 (ref 1.005–1.030)
pH: 5 (ref 5.0–8.0)

## 2018-05-03 LAB — CBC WITH DIFFERENTIAL/PLATELET
Abs Immature Granulocytes: 0.04 10*3/uL (ref 0.00–0.07)
BASOS ABS: 0 10*3/uL (ref 0.0–0.1)
Basophils Relative: 0 %
EOS ABS: 0 10*3/uL (ref 0.0–0.5)
EOS PCT: 0 %
HCT: 32.7 % — ABNORMAL LOW (ref 39.0–52.0)
Hemoglobin: 10.7 g/dL — ABNORMAL LOW (ref 13.0–17.0)
Immature Granulocytes: 0 %
Lymphocytes Relative: 7 %
Lymphs Abs: 0.8 10*3/uL (ref 0.7–4.0)
MCH: 26 pg (ref 26.0–34.0)
MCHC: 32.7 g/dL (ref 30.0–36.0)
MCV: 79.6 fL — AB (ref 80.0–100.0)
MONO ABS: 1.3 10*3/uL — AB (ref 0.1–1.0)
Monocytes Relative: 11 %
NRBC: 0 % (ref 0.0–0.2)
Neutro Abs: 9.9 10*3/uL — ABNORMAL HIGH (ref 1.7–7.7)
Neutrophils Relative %: 82 %
Platelets: 173 10*3/uL (ref 150–400)
RBC: 4.11 MIL/uL — ABNORMAL LOW (ref 4.22–5.81)
RDW: 14.2 % (ref 11.5–15.5)
WBC: 12.1 10*3/uL — ABNORMAL HIGH (ref 4.0–10.5)

## 2018-05-03 LAB — COMPREHENSIVE METABOLIC PANEL
ALT: 20 U/L (ref 0–44)
ANION GAP: 12 (ref 5–15)
AST: 35 U/L (ref 15–41)
Albumin: 3.3 g/dL — ABNORMAL LOW (ref 3.5–5.0)
Alkaline Phosphatase: 62 U/L (ref 38–126)
BILIRUBIN TOTAL: 1.5 mg/dL — AB (ref 0.3–1.2)
BUN: 25 mg/dL — ABNORMAL HIGH (ref 8–23)
CALCIUM: 8.7 mg/dL — AB (ref 8.9–10.3)
CO2: 21 mmol/L — ABNORMAL LOW (ref 22–32)
Chloride: 103 mmol/L (ref 98–111)
Creatinine, Ser: 1.66 mg/dL — ABNORMAL HIGH (ref 0.61–1.24)
GFR, EST AFRICAN AMERICAN: 51 mL/min — AB (ref >60)
GFR, EST NON AFRICAN AMERICAN: 44 mL/min — AB (ref >60)
Glucose, Bld: 103 mg/dL — ABNORMAL HIGH (ref 70–99)
POTASSIUM: 3.2 mmol/L — AB (ref 3.5–5.1)
Sodium: 136 mmol/L (ref 135–145)
TOTAL PROTEIN: 8 g/dL (ref 6.5–8.1)

## 2018-05-03 LAB — SEDIMENTATION RATE: Sed Rate: 120 mm/hr — ABNORMAL HIGH (ref 0–16)

## 2018-05-03 LAB — URIC ACID: URIC ACID, SERUM: 10.6 mg/dL — AB (ref 3.7–8.6)

## 2018-05-03 LAB — INFLUENZA PANEL BY PCR (TYPE A & B)
INFLBPCR: NEGATIVE
Influenza A By PCR: NEGATIVE

## 2018-05-03 LAB — CK: Total CK: 222 U/L (ref 49–397)

## 2018-05-03 LAB — C-REACTIVE PROTEIN: CRP: 10 mg/dL — ABNORMAL HIGH (ref <1.0)

## 2018-05-03 LAB — I-STAT CG4 LACTIC ACID, ED: LACTIC ACID, VENOUS: 1.39 mmol/L (ref 0.5–1.9)

## 2018-05-03 MED ORDER — ENOXAPARIN SODIUM 40 MG/0.4ML ~~LOC~~ SOLN
40.0000 mg | Freq: Every day | SUBCUTANEOUS | Status: DC
  Administered 2018-05-03 – 2018-05-05 (×3): 40 mg via SUBCUTANEOUS
  Filled 2018-05-03 (×3): qty 0.4

## 2018-05-03 MED ORDER — HYDROMORPHONE HCL 1 MG/ML IJ SOLN
1.0000 mg | Freq: Four times a day (QID) | INTRAMUSCULAR | Status: DC | PRN
  Administered 2018-05-04 – 2018-05-06 (×2): 1 mg via INTRAVENOUS
  Filled 2018-05-03 (×2): qty 1

## 2018-05-03 MED ORDER — OXYCODONE-ACETAMINOPHEN 5-325 MG PO TABS
2.0000 | ORAL_TABLET | ORAL | Status: DC | PRN
  Administered 2018-05-03 – 2018-05-06 (×5): 2 via ORAL
  Filled 2018-05-03 (×5): qty 2

## 2018-05-03 MED ORDER — COLCHICINE 0.6 MG PO TABS
0.6000 mg | ORAL_TABLET | Freq: Once | ORAL | Status: DC
  Filled 2018-05-03: qty 1

## 2018-05-03 MED ORDER — ACETAMINOPHEN 325 MG PO TABS
650.0000 mg | ORAL_TABLET | Freq: Once | ORAL | Status: AC
  Administered 2018-05-03: 650 mg via ORAL
  Filled 2018-05-03: qty 2

## 2018-05-03 MED ORDER — SODIUM CHLORIDE 0.9 % IV BOLUS
1000.0000 mL | Freq: Once | INTRAVENOUS | Status: AC
  Administered 2018-05-03: 1000 mL via INTRAVENOUS

## 2018-05-03 MED ORDER — FAMOTIDINE 20 MG PO TABS
40.0000 mg | ORAL_TABLET | Freq: Every day | ORAL | Status: DC
  Administered 2018-05-04 – 2018-05-06 (×3): 40 mg via ORAL
  Filled 2018-05-03 (×3): qty 2

## 2018-05-03 MED ORDER — MORPHINE SULFATE (PF) 4 MG/ML IV SOLN
4.0000 mg | Freq: Once | INTRAVENOUS | Status: AC
  Administered 2018-05-03: 4 mg via INTRAVENOUS
  Filled 2018-05-03: qty 1

## 2018-05-03 MED ORDER — POTASSIUM CHLORIDE CRYS ER 20 MEQ PO TBCR
40.0000 meq | EXTENDED_RELEASE_TABLET | Freq: Once | ORAL | Status: AC
  Administered 2018-05-03: 40 meq via ORAL
  Filled 2018-05-03: qty 2

## 2018-05-03 MED ORDER — METHYLPREDNISOLONE SODIUM SUCC 125 MG IJ SOLR
125.0000 mg | Freq: Once | INTRAMUSCULAR | Status: AC
  Administered 2018-05-03: 125 mg via INTRAVENOUS
  Filled 2018-05-03: qty 2

## 2018-05-03 MED ORDER — KETOROLAC TROMETHAMINE 15 MG/ML IJ SOLN
15.0000 mg | Freq: Once | INTRAMUSCULAR | Status: AC
  Administered 2018-05-03: 15 mg via INTRAVENOUS
  Filled 2018-05-03: qty 1

## 2018-05-03 MED ORDER — AMLODIPINE BESYLATE 5 MG PO TABS
10.0000 mg | ORAL_TABLET | Freq: Every day | ORAL | Status: DC
  Administered 2018-05-04 – 2018-05-06 (×3): 10 mg via ORAL
  Filled 2018-05-03 (×3): qty 2

## 2018-05-03 MED ORDER — COLCHICINE 0.6 MG PO TABS
0.6000 mg | ORAL_TABLET | Freq: Once | ORAL | Status: AC
  Administered 2018-05-03: 0.6 mg via ORAL
  Filled 2018-05-03: qty 1

## 2018-05-03 NOTE — ED Triage Notes (Signed)
Pt presents via EMS with c/o generalized body aches. Pt c/o pain in both knees, left hand, and left foot. EMS reports pt says he has been on his couch for 3 days and unable to move. Pt denies any hx of gout.

## 2018-05-03 NOTE — ED Provider Notes (Signed)
[facility] DEPT Provider Note   CSN: [id number] Arrival date & time: 05/03/18  1502     History   Chief Complaint Chief Complaint  Patient presents with  . Generalized Body Aches    HPI John [name] is a 63 y.o. male history of previous alcohol abuse with hepatitis C (in remission), heart failure, CKD, cirrhosis here presenting with fever, body aches, multiple joint pain.  Patient states that 4 days ago, he started having left wrist pain.  It then progressed to bilateral knees and foot.  Also he states that he was so much pain that he is in bed and unable to get up.  He has decreased intake as well.  Patient states that he has body aches and subjective chills but did not take his temperature.  He denies any history of gout or rheumatologic diseases.  Patient has previous admission for pneumonia and has nonproductive cough.  Patient did get flu shot this year.  Of note, patient had previous alcohol abuse but denies drinking alcohol currently.  Denies any abdominal pain.  The history is provided by the patient.    Past Medical History:  Diagnosis Date  . Arthritis   . Ascites   . Cataract   . CHF (congestive heart failure) ([place])   . Chronic back pain   . Chronic kidney disease   . Cirrhosis ([place])   . Dyspnea    walking   . ETOH abuse   . GERD (gastroesophageal reflux disease)   . Hepatitis   . History of blood transfusion   . History of bronchitis   . Hypertension   . Neuromuscular disorder ([place])   . Optic neuropathy, left   . Polysubstance abuse ([place])   . Seizures [facility])    age 29  . Sickle cell trait ([place])   . Stroke [facility]) 1998    Patient Active Problem List   Diagnosis Date Noted  . Esophageal varices without bleeding ([place])   . Renal cell carcinoma of left kidney ([place]) 11/08/2016  . Malnutrition of moderate degree 07/20/2015  . CHF (congestive heart failure) ([place]) 07/19/2015  . Abdominal pain 07/19/2015  . Renal failure (ARF),  acute on chronic (HCC) 07/19/2015  . Acute-on-chronic kidney injury ([place])   . Hyponatremia 07/07/2015  . Dehydration 07/07/2015  . Acute respiratory failure ([place]) 07/07/2015  . Chronic kidney disease, stage 3 ([place]) 05/25/2015  . CAP (community acquired pneumonia) 05/23/2015  . Acute respiratory failure with hypoxia ([place]) 05/23/2015  . Fluid overload 10/31/2014  . Hepatitis C 10/31/2014  . Ascites of liver 10/31/2014  . Hepatitis, viral   . Edema of abdominal wall   . Pedal edema   . Alcoholism ([place])   . Abdominal edema on examination 09/01/2014  . Chronic liver failure ([place])   . Alcoholic cirrhosis of liver with ascites ([place])   . Chronic hepatitis C with cirrhosis ([place])   . Cardiac hypertrophy   . Fever of unknown origin   . Diastolic dysfunction   . Mental status change   . Metabolic encephalopathy   . Pulmonary edema   . Acute encephalopathy   . Ascites due to alcoholic cirrhosis ([place])   . Hypernatremia   . Other specified fever   . Varices, gastric   . Acute on chronic renal failure ([place])   . Elevated transaminase level   . Essential hypertension   . Gastric varices   . Upper GI bleed 08/14/2014  . Acute blood loss anemia 08/14/2014  .  Alcohol abuse 08/14/2014  . Coagulopathy ([place]) 08/14/2014  . AKI (acute kidney injury) ([place]) 08/14/2014  . GI bleed 08/14/2014  . GIB (gastrointestinal bleeding) 08/14/2014  . Bleeding gastric varices 08/14/2014  . Acute upper GI bleed   . Cirrhosis ([place]) 07/27/2014  . Abdominal pain, generalized 07/27/2014  . Ascites 07/26/2014  . Optic neuritis 07/11/2014  . Abnormal LFTs   . Vision loss of left eye 06/24/2014  . Accelerated hypertension 06/24/2014  . Elevated LFTs 06/24/2014  . Polysubstance abuse ([place]) 06/24/2014  . Chronic kidney disease 06/24/2014  . SOB (shortness of breath) 06/24/2014  . Vision loss, left eye 06/24/2014  . Thrombocytopenia ([place]) 06/24/2014  . Hypokalemia 06/24/2014  . Elevated troponin 06/24/2014  .  Essential hypertension, benign 12/29/2012  . Neck pain 12/29/2012  . Low back pain 12/29/2012    Past Surgical History:  Procedure Laterality Date  . ESOPHAGOGASTRODUODENOSCOPY N/A 08/14/2014   Procedure: ESOPHAGOGASTRODUODENOSCOPY (EGD);  Surgeon: Ladene Artist, MD;  Location: [facility];  Service: Endoscopy;  Laterality: N/A;  . ESOPHAGOGASTRODUODENOSCOPY (EGD) WITH PROPOFOL N/A 06/03/2017   Procedure: ESOPHAGOGASTRODUODENOSCOPY (EGD) WITH PROPOFOL;  Surgeon: Ladene Artist, MD;  Location: [facility];  Service: Endoscopy;  Laterality: N/A;  . IR RADIOLOGIST EVAL & MGMT  09/25/2016  . IR RADIOLOGIST EVAL & MGMT  12/10/2016  . IR RADIOLOGIST EVAL & MGMT  03/25/2018  . none    . RADIOFREQUENCY ABLATION Left 11/08/2016   Procedure: LEFT RENAL CRYOABLATION;  Surgeon: Greggory Keen, MD;  Location: [facility];  Service: Anesthesiology;  Laterality: Left;  . RADIOLOGY WITH ANESTHESIA N/A 08/15/2014   Procedure: RADIOLOGY WITH ANESTHESIA;  Surgeon: Greggory Keen, MD;  Location: [facility];  Service: Radiology;  Laterality: N/A;        Home Medications    Prior to Admission medications   Medication Sig Start Date End Date Taking? Authorizing Provider  amLODipine (NORVASC) 10 MG tablet Take 1 tablet (10 mg total) by mouth daily. 05/25/15  Yes Debbe Odea, MD  famotidine (PEPCID) 40 MG tablet Take 40 mg by mouth daily.  07/25/17  Yes [provider]  furosemide (LASIX) 40 MG tablet Take 40 mg by mouth daily.   Yes [provider]  hydrALAZINE (APRESOLINE) 50 MG tablet Take 1 tablet (50 mg total) by mouth 3 (three) times daily. 05/25/15  Yes Debbe Odea, MD  HYDROcodone-acetaminophen (NORCO/VICODIN) 5-325 MG tablet Take 1 tablet by mouth every 6 (six) hours as needed for moderate pain or severe pain.  02/20/18  Yes [provider]  ketoconazole (NIZORAL) 2 % cream Apply 1 application topically 2 (two) times daily as needed for irritation.  09/30/17  Yes [provider]  lactulose (CHRONULAC) 10 GM/15ML solution Take 10 g by mouth daily as needed for mild constipation or moderate constipation.  11/01/16  Yes [provider]  methocarbamol (ROBAXIN) 500 MG tablet Take 500 mg by mouth every 6 (six) hours as needed for muscle spasms.  01/29/18  Yes [provider]  omeprazole (PRILOSEC) 40 MG capsule Take 1 capsule (40 mg total) by mouth daily. 06/03/17  Yes Ladene Artist, MD  PROAIR HFA 108 [phone] Base) MCG/ACT inhaler Inhale 1-2 puffs into the lungs every 6 (six) hours as needed for wheezing or shortness of breath.  10/02/17  Yes [provider]  clobetasol (TEMOVATE) 0.05 % external solution Apply 1 application topically 2 (two) times daily as needed (skin irritation).  10/01/17   [provider]    Family History  Family History  Problem Relation Age of Onset  . Hypertension Mother        Living  . Kidney disease Mother   . Diabetes Mother   . Heart disease Mother   . Hypertension Father        Deceased, 29  . Ulcers Father   . Stomach cancer Father   . Hypertension Brother   . Diabetes Brother   . Kidney disease Brother   . Hypertension Sister   . Colon cancer Neg Hx   . Esophageal cancer Neg Hx   . Rectal cancer Neg Hx     Social History Social History   Tobacco Use  . Smoking status: Former Smoker    Packs/day: 0.30    Years: 20.00    Pack years: 6.00    Types: Cigarettes  . Smokeless tobacco: Never Used  . Tobacco comment: 2015  Substance Use Topics  . Alcohol use: No    Alcohol/week: 0.0 standard drinks  . Drug use: No    Types: Cocaine    Comment: since fall 2015     Allergies   Penicillins   Review of Systems Review of Systems  Constitutional: Positive for chills.  Respiratory: Positive for cough.   Musculoskeletal: Positive for arthralgias and myalgias.  All other systems reviewed and are negative.    Physical Exam Updated Vital Signs BP 137/84 (BP Location: Right Arm)   Pulse 71    Temp (!) 100.4 F (38 C) (Oral)   Resp 17   Ht 5\' 8"  (1.727 m)   Wt 111.1 kg   SpO2 98%   BMI 37.25 kg/m   Physical Exam Vitals signs and nursing note reviewed.  Constitutional:      Comments: Uncomfortable  HENT:     Head: Normocephalic.     Right Ear: Tympanic membrane normal.     Nose: Nose normal.     Mouth/Throat:     Mouth: Mucous membranes are dry.     Comments: MM dry  Eyes:     Extraocular Movements: Extraocular movements intact.     Pupils: Pupils are equal, round, and reactive to light.  Neck:     Musculoskeletal: Normal range of motion.  Cardiovascular:     Rate and Rhythm: Normal rate.  Pulmonary:     Effort: Pulmonary effort is normal.     Comments: Crackles L base  Abdominal:     General: Abdomen is flat.     Palpations: Abdomen is soft.  Musculoskeletal:     Comments: L hand diffusely swollen, L knee with effusion and dec ROM but no obvious redness. R ankle swollen as well. No calf tenderness. He does have diffuse muscle tenderness throughout   Skin:    General: Skin is warm.     Capillary Refill: Capillary refill takes less than 2 seconds.  Neurological:     General: No focal deficit present.  Psychiatric:        Mood and Affect: Mood normal.      ED Treatments / Results  Labs (all labs ordered are listed, but only abnormal results are displayed) Labs Reviewed  CBC WITH DIFFERENTIAL/PLATELET - Abnormal; Notable for the following components:      Result Value   WBC 12.1 (*)    RBC 4.11 (*)    Hemoglobin 10.7 (*)    HCT 32.7 (*)    MCV 79.6 (*)    Neutro Abs 9.9 (*)    Monocytes Absolute 1.3 (*)  All other components within normal limits  COMPREHENSIVE METABOLIC PANEL - Abnormal; Notable for the following components:   Potassium 3.2 (*)    CO2 21 (*)    Glucose, Bld 103 (*)    BUN 25 (*)    Creatinine, Ser 1.66 (*)    Calcium 8.7 (*)    Albumin 3.3 (*)    Total Bilirubin 1.5 (*)    GFR calc non Af Amer 44 (*)    GFR calc Af Amer  51 (*)    All other components within normal limits  URINALYSIS, ROUTINE W REFLEX MICROSCOPIC - Abnormal; Notable for the following components:   Hgb urine dipstick MODERATE (*)    All other components within normal limits  URIC ACID - Abnormal; Notable for the following components:   Uric Acid, Serum 10.6 (*)    All other components within normal limits  URINE CULTURE  CK  INFLUENZA PANEL BY PCR (TYPE A & B)  I-STAT CG4 LACTIC ACID, ED    EKG None  Radiology Dg Chest 2 View  Result Date: 05/03/2018 CLINICAL DATA:  Weakness, chills, generalized body aches, burning sensation LEFT knee, pain and swelling LEFT hand, history CHF, stroke, cirrhosis, GERD, hypertension, substance abuse EXAM: CHEST - 2 VIEW COMPARISON:  12/25/2016 FINDINGS: Enlargement of cardiac silhouette with pulmonary vascular congestion. Mediastinal contours normal. Lungs clear. No infiltrate, pleural effusion, or pneumothorax. Respiratory motion artifacts degrade lateral view. No acute osseous findings. IMPRESSION: Enlargement of cardiac silhouette with slight vascular congestion. No acute abnormalities. Electronically Signed   By: Lavonia Dana M.D.   On: 05/03/2018 17:02   Dg Knee Complete 4 Views Left  Result Date: 05/03/2018 CLINICAL DATA:  Burning sensation LEFT knee EXAM: LEFT KNEE - COMPLETE 4+ VIEW COMPARISON:  None FINDINGS: Osseous demineralization. Medial compartment joint space narrowing. Small knee joint effusion. No acute fracture, dislocation, or bone destruction. IMPRESSION: Mild degenerative changes and small joint effusion LEFT knee. No acute abnormalities. Electronically Signed   By: Lavonia Dana M.D.   On: 05/03/2018 17:03   Dg Hand Complete Left  Result Date: 05/03/2018 CLINICAL DATA:  LEFT hand pain and swelling EXAM: LEFT HAND - COMPLETE 3+ VIEW COMPARISON:  None FINDINGS: Osseous demineralization. Joint spaces preserved. Diffuse soft tissue swelling throughout LEFT hand wrist. No fracture, dislocation, or  bone destruction. IMPRESSION: Soft tissue swelling and osseous demineralization without acute bony abnormalities. Electronically Signed   By: Lavonia Dana M.D.   On: 05/03/2018 17:03    Procedures Procedures (including critical care time)  Medications Ordered in ED Medications  colchicine tablet 0.6 mg (has no administration in time range)  sodium chloride 0.9 % bolus 1,000 mL (0 mLs Intravenous Stopped 05/03/18 1727)  acetaminophen (TYLENOL) tablet 650 mg (650 mg Oral Given 05/03/18 1727)  methylPREDNISolone sodium succinate (SOLU-MEDROL) 125 mg/2 mL injection 125 mg (125 mg Intravenous Given 05/03/18 1840)  morphine 4 MG/ML injection 4 mg (4 mg Intravenous Given 05/03/18 1840)  sodium chloride 0.9 % bolus 1,000 mL (1,000 mLs Intravenous Bolus 05/03/18 1841)  colchicine tablet 0.6 mg (0.6 mg Oral Given 05/03/18 1840)     Initial Impression / Assessment and Plan / ED Course  I have reviewed the triage vital signs and the nursing notes.  Pertinent labs & imaging results that were available during my care of the patient were reviewed by me and considered in my medical decision making (see chart for details).    John [name] is a 63 y.o. male here with body aches,  joint pain. Patient has low grade temp 100.2 F rectally. Consider flu vs pneumonia vs UTI vs rhabdo vs new onset gout. Will get labs, lactate, cultures, UA, CXR, flu, CK level. Will hydrate and reassess.   8:43 PM Patient had a temp of 100.4 F. WBC 12. Cr. 1.6, stable. Uric acid is elevated 10. UA and CXR unremarkable. Flu negative. Tried steroids, colchicine, pain medicine. Still unable to even get out of bed. I wonder if this is severe gout vs rheumatologic condition that is undiagnosed. I talked to Dr. Lorin Mercy from ortho, who recommend colchicine, steroids and follow up in office. Hospitalist to admit for pain control and further fever workup.   Final Clinical Impressions(s) / ED Diagnoses   Final diagnoses:  None    ED Discharge  Orders    None       Drenda Freeze, MD 05/03/18 2045

## 2018-05-03 NOTE — ED Notes (Signed)
Unable to ambulate patient due to weakness and pain in left lower extremity.

## 2018-05-03 NOTE — H&P (Signed)
History and Physical  John [name] PXT:[id number] DOB: [birth year] DOA: 05/03/2018 1604  Referring physician: Allie Bossier PCP: Mindi Curling, PA-C  HISTORY   Chief Complaint: acute hand, knee, and foot swelling  HPI: John [name] is a 63 y.o. male with hx of alcohol abuse, sickle cell trait, CKD stage 39, cirrhosis, HCV (not treated), hx of diastolic CHF who presented with acute hand, knee, and feet swelling x 2 days. Patient noted that 2-3 days ago, he first noticed that his L wrist was slightly swollen -- progressed to increased swelling involving his L hand, with associated severe pain and tenderness. Over the next few days, he developed initial R foot then L foot swelling and then L knee swelling, all with severe pain and tenderness. States that he is barely able to ambulate due to the L knee and foot pain because he unable to bear weight on it. His R foot swelling/pain has now nearly resolved (by itself) today. Over the past day has also experienced generalized myalgias and subjective fevers. He denies preceding URI sx such as rhinorrhea, sore throat, or cough. He thinks that one of his child family members that he is contact with may have had URI symptoms in the past few weeks. He himself reports being in his usual state of health over the past month until this current presentation of joint pain/edema.  Denies recent foreign travel or exposure to wooded areas. He reports getting the flu shot for this season in late 2019.  Denies prior hx of gout or rheumatological illnesses; denies family hx of autoimmune disease to his knowledge. He currently reports exquisite pain and tenderness over the aforementioned joints.    Review of Systems:  + multiple joint pains as above + subjective fever + malaise - no cough, rhinorrhea, sore throat - no chest pain, dyspnea on exertion - no edema, PND, orthopnea - no nausea/vomiting; no tarry, melanotic or bloody stools - no dysuria, increased urinary  frequency - no weight changes Rest of systems reviewed are negative, except as per above history.   ED course:  Vitals Blood pressure 125/77, pulse 66, temperature (!) 100.4 F (38 C), temperature source Oral, resp. rate 16, height 5\' 8"  (1.727 m), weight 111.1 kg, SpO2 96 %. Received IV solumedrol 125mg  x 1; colchicine 0.6mg  x 1; NS bolus x 2L;   Past Medical History:  Diagnosis Date  . Arthritis   . Ascites   . Cataract   . CHF (congestive heart failure) ([place])   . Chronic back pain   . Chronic kidney disease   . Cirrhosis ([place])   . Dyspnea    walking   . ETOH abuse   . GERD (gastroesophageal reflux disease)   . Hepatitis   . History of blood transfusion   . History of bronchitis   . Hypertension   . Neuromuscular disorder ([place])   . Optic neuropathy, left   . Polysubstance abuse ([place])   . Seizures [facility])    age 40  . Sickle cell trait ([place])   . Stroke [facility]) 1998   Past Surgical History:  Procedure Laterality Date  . ESOPHAGOGASTRODUODENOSCOPY N/A 08/14/2014   Procedure: ESOPHAGOGASTRODUODENOSCOPY (EGD);  Surgeon: Ladene Artist, MD;  Location: [facility];  Service: Endoscopy;  Laterality: N/A;  . ESOPHAGOGASTRODUODENOSCOPY (EGD) WITH PROPOFOL N/A 06/03/2017   Procedure: ESOPHAGOGASTRODUODENOSCOPY (EGD) WITH PROPOFOL;  Surgeon: Ladene Artist, MD;  Location: [facility];  Service: Endoscopy;  Laterality: N/A;  . IR RADIOLOGIST EVAL & MGMT  09/25/2016  . IR RADIOLOGIST EVAL & MGMT  12/10/2016  . IR RADIOLOGIST EVAL & MGMT  03/25/2018  . none    . RADIOFREQUENCY ABLATION Left 11/08/2016   Procedure: LEFT RENAL CRYOABLATION;  Surgeon: Greggory Keen, MD;  Location: [facility];  Service: Anesthesiology;  Laterality: Left;  . RADIOLOGY WITH ANESTHESIA N/A 08/15/2014   Procedure: RADIOLOGY WITH ANESTHESIA;  Surgeon: Greggory Keen, MD;  Location: [place];  Service: Radiology;  Laterality: N/A;    Social History:  reports that he has quit smoking. His smoking use included cigarettes.  He has a 6.00 pack-year smoking history. He has never used smokeless tobacco. He reports that he does not drink alcohol or use drugs.  Allergies  Allergen Reactions  . Penicillins Other (See Comments)    Convulsions, Patient could not walk, childhood allergy   Has patient had a PCN reaction causing immediate rash, facial/tongue/throat swelling, SOB or lightheadedness with hypotension: No Has patient had a PCN reaction causing severe rash involving mucus membranes or skin necrosis: No Has patient had a PCN reaction that required hospitalization No Has patient had a PCN reaction occurring within the last 10 years: No If all of the above answers are "NO", then may proceed with Cephalosporin use.    Family History  Problem Relation Age of Onset  . Hypertension Mother        Living  . Kidney disease Mother   . Diabetes Mother   . Heart disease Mother   . Hypertension Father        Deceased, 70  . Ulcers Father   . Stomach cancer Father   . Hypertension Brother   . Diabetes Brother   . Kidney disease Brother   . Hypertension Sister   . Colon cancer Neg Hx   . Esophageal cancer Neg Hx   . Rectal cancer Neg Hx       Prior to Admission medications   Medication Sig Start Date End Date Taking? Authorizing Provider  amLODipine (NORVASC) 10 MG tablet Take 1 tablet (10 mg total) by mouth daily. 05/25/15  Yes Debbe Odea, MD  famotidine (PEPCID) 40 MG tablet Take 40 mg by mouth daily.  07/25/17  Yes [provider]  furosemide (LASIX) 40 MG tablet Take 40 mg by mouth daily.   Yes [provider]  hydrALAZINE (APRESOLINE) 50 MG tablet Take 1 tablet (50 mg total) by mouth 3 (three) times daily. 05/25/15  Yes Debbe Odea, MD  HYDROcodone-acetaminophen (NORCO/VICODIN) 5-325 MG tablet Take 1 tablet by mouth every 6 (six) hours as needed for moderate pain or severe pain.  02/20/18  Yes [provider]  ketoconazole (NIZORAL) 2 % cream Apply 1 application topically 2  (two) times daily as needed for irritation.  09/30/17  Yes [provider]  lactulose (CHRONULAC) 10 GM/15ML solution Take 10 g by mouth daily as needed for mild constipation or moderate constipation.  11/01/16  Yes [provider]  methocarbamol (ROBAXIN) 500 MG tablet Take 500 mg by mouth every 6 (six) hours as needed for muscle spasms.  01/29/18  Yes [provider]  omeprazole (PRILOSEC) 40 MG capsule Take 1 capsule (40 mg total) by mouth daily. 06/03/17  Yes Ladene Artist, MD  PROAIR HFA 108 [phone] Base) MCG/ACT inhaler Inhale 1-2 puffs into the lungs every 6 (six) hours as needed for wheezing or shortness of breath.  10/02/17  Yes [provider]  clobetasol (TEMOVATE) 0.05 % external solution Apply 1 application topically 2 (two)  times daily as needed (skin irritation).  10/01/17   [provider]    PHYSICAL EXAM   Temp:  [99.3 F (37.4 C)-100.4 F (38 C)] 100.4 F (38 C) (01/05 1830) Pulse Rate:  [66-94] 66 (01/05 2135) Resp:  [16-18] 16 (01/05 2135) BP: (125-148)/(73-94) 125/77 (01/05 2135) SpO2:  [93 %-98 %] 96 % (01/05 2135) Weight:  [111.1 kg] 111.1 kg (01/05 1510)  BP 125/77 (BP Location: Right Arm)   Pulse 66   Temp (!) 100.4 F (38 C) (Oral)   Resp 16   Ht 5\' 8"  (1.727 m)   Wt 111.1 kg   SpO2 96%   BMI 37.25 kg/m    GEN obese middle-aged african-american male; resting in bed, not in acute distress, appears moderately uncomfortable due to joint pain  HEENT NCAT EOM intact PERRL; clear oropharynx, no cervical LAD; dry mucus membranes  JVP estimated 4 cm H2O above RA; no HJR ; no carotid bruits b/l ;  CV regular normal rate; normal S1 and S2; no m/r/g or S3/S4; PMI non displaced; no parasternal heave  RESP CTA b/l; breathing unlabored and symmetric  ABD soft NT ND +normoactive BS  EXT warm throughout b/l; no pitting edema b/l; swollen L knee and L foot (see below) PULSES  DP and radials 2+ intact b/l  SKIN/MSK  L hand is swollen  over dorsum and warm to touch, at wrist to proximal small joints but largely sparing the DIPs. L knee with small effusion, warm and swollen. Tender to palpation. L foot is also swollen to lateral ankle, similarly sparing toes. No open wounds or ulcers. R foot is slightly edematous, but less so compared to left. ROM is severely limited due to pain in L knee and L hand MCP joints; ROM is more preserved in L foot. No rashes.   NEURO/PSYCH AAOx4; no focal deficits; motor exam limited as above   DATA   LABS ON ADMISSION:  Basic Metabolic Panel: Recent Labs  Lab 05/03/18 1628  NA 136  K 3.2*  CL 103  CO2 21*  GLUCOSE 103*  BUN 25*  CREATININE 1.66*  CALCIUM 8.7*   CBC: Recent Labs  Lab 05/03/18 1628  WBC 12.1*  NEUTROABS 9.9*  HGB 10.7*  HCT 32.7*  MCV 79.6*  PLT 173   Liver Function Tests: Recent Labs  Lab 05/03/18 1628  AST 35  ALT 20  ALKPHOS 62  BILITOT 1.5*  PROT 8.0  ALBUMIN 3.3*   No results for input(s): LIPASE, AMYLASE in the last 168 hours. No results for input(s): AMMONIA in the last 168 hours. Coagulation:  Lab Results  Component Value Date   INR 1.13 10/17/2016   INR 1.09 03/15/2016   INR 1.08 07/19/2015   No results found for: PTT Lactic Acid, Venous:     Component Value Date/Time   LATICACIDVEN 1.39 05/03/2018 1635   Cardiac Enzymes: Recent Labs  Lab 05/03/18 1628  CKTOTAL 222   Urinalysis:    Component Value Date/Time   COLORURINE YELLOW 05/03/2018 1628   APPEARANCEUR CLEAR 05/03/2018 1628   LABSPEC 1.012 05/03/2018 1628   PHURINE 5.0 05/03/2018 1628   GLUCOSEU NEGATIVE 05/03/2018 1628   HGBUR MODERATE (A) 05/03/2018 1628   BILIRUBINUR NEGATIVE 05/03/2018 1628   KETONESUR NEGATIVE 05/03/2018 1628   PROTEINUR NEGATIVE 05/03/2018 1628   UROBILINOGEN 1.0 10/31/2014 2127   NITRITE NEGATIVE 05/03/2018 1628   LEUKOCYTESUR NEGATIVE 05/03/2018 1628    BNP (last 3 results) No results for input(s): PROBNP in  the last 8760  hours. CBG: No results for input(s): GLUCAP in the last 168 hours.  Radiological Exams on Admission: Dg Chest 2 View  Result Date: 05/03/2018 CLINICAL DATA:  Weakness, chills, generalized body aches, burning sensation LEFT knee, pain and swelling LEFT hand, history CHF, stroke, cirrhosis, GERD, hypertension, substance abuse EXAM: CHEST - 2 VIEW COMPARISON:  12/25/2016 FINDINGS: Enlargement of cardiac silhouette with pulmonary vascular congestion. Mediastinal contours normal. Lungs clear. No infiltrate, pleural effusion, or pneumothorax. Respiratory motion artifacts degrade lateral view. No acute osseous findings. IMPRESSION: Enlargement of cardiac silhouette with slight vascular congestion. No acute abnormalities. Electronically Signed   By: Lavonia Dana M.D.   On: 05/03/2018 17:02   Dg Knee Complete 4 Views Left  Result Date: 05/03/2018 CLINICAL DATA:  Burning sensation LEFT knee EXAM: LEFT KNEE - COMPLETE 4+ VIEW COMPARISON:  None FINDINGS: Osseous demineralization. Medial compartment joint space narrowing. Small knee joint effusion. No acute fracture, dislocation, or bone destruction. IMPRESSION: Mild degenerative changes and small joint effusion LEFT knee. No acute abnormalities. Electronically Signed   By: Lavonia Dana M.D.   On: 05/03/2018 17:03   Dg Hand Complete Left  Result Date: 05/03/2018 CLINICAL DATA:  LEFT hand pain and swelling EXAM: LEFT HAND - COMPLETE 3+ VIEW COMPARISON:  None FINDINGS: Osseous demineralization. Joint spaces preserved. Diffuse soft tissue swelling throughout LEFT hand wrist. No fracture, dislocation, or bone destruction. IMPRESSION: Soft tissue swelling and osseous demineralization without acute bony abnormalities. Electronically Signed   By: Lavonia Dana M.D.   On: 05/03/2018 17:03    EKG:  Not obtained, pending.   I have reviewed the patient's previous electronic chart records, labs, and other data.   ASSESSMENT AND PLAN   Assessment: John [name] is a 63  y.o. male with hx of alcohol abuse, sickle cell trait, CKD stage 2, cirrhosis, HCV (not treated), hx of diastolic CHF who presented with acute migratory (mostly symmetric) polyarthralgia x 2 days without clear preceding symptoms. Given the acuity of the symptoms, suspect infectious or post-infectious etiology. HCV is related with chronic arthritis but the acuity of this presentation would be unusual for HCV related joint disease. Will start infectious workup and basic rheumatologic workup as well. Imaging and exam notable for effusion over L knee. Otherwise no bony or joint space acute abnormalities on plain film. No prior rheum history. Although uric acid is mildly elevated at 10, history is inconsistent with gout and uric acid level alone is not diagnostic of gout attack, especially in a patient without a prior history. Low grade temp but otherwise non-toxic appearing.   Of note, another similar presenting patient was seen yesterday in the same hospital. Whether this is true-true-unrelated or indicative of the same etiology is unclear -- however, if it is the same process, may be suggestive of a contagious viral process, such as parvovirus.    Active Problems:   Polyarthralgia   Plan:   # Acute polyarthritis of L hand, L foot, L knee (small effusion), and previously R foot > see Assessment re: differential. Post infectious vs infectious most likely vs gout - s/p IV solumedrol and colchicine in ED (hold for now) - blood cultures pending - CRP and sed rate pending - HIV pending (prior HIV test in 2017 was negative) - GC / chlamydia pending - rheum labs: ANA and RF pending - resp viral panel pending - lyme abs pending - parvo IgG and IgM pending - pain control with oxycodone-tylenol and prn dilaudid 1mg  for breakthrough  pain - one time spot dose toradol 15mg  (caution given CKD) - if sx not improve, consider contacting ortho for possible L knee joint aspiration, although may be limited due to  small size of effusion - droplet precautions - PT/OT to assess ambulation   # CKD stage 2, admission Cr 1.66 > baseline Cr 1.5-1.7 - monitor daily - cautious dosing with NSAID given CKD, avoid if possible - note: one time toradol dosing overnight - if Cr stable, may benefit from few doses of toradol  # Microcytic anemia Hb 10.7 on admission, chronic, likely related to sickle cell trait > baseline near Hb 10-11; prior normal iron testing  - since at baseline, will monitor daily - hold off on iron testing given prior labs  # HTN and hx of diastolic CHF - resume home amlodipine  - hold off on resuming home lasix   # Hypokalemia K 3.2, likely related to contraction 2/2 mild dehydration - repletion with 40 meq PO overnight - repeat labs in AM  # Hx of alcohol abuse - denies active use  - monitor for signs of withdrawal. No CIWA protocol at this time.     # Mildly elevated tbili - at baseline (1.5 on admission; 1.9 previously), likely related to liver dysfunction  - unlikely this is related to primary presentation  - if pt develops abdom sx, can repeat CMP  DVT Prophylaxis: lovenox  Code Status:  Full Code Family Communication: discussed plan with patient  Disposition Plan: admit for workup of arthralgia and extensive lab workup; pending PT/OT and sx relief  Patient contact: Extended Emergency Contact Information Primary Emergency Contact: Royall,[name] Address: [address]          [place], [place] [address] [place] Phone: [phone] Relation: Brother Secondary Emergency Contact: Grayland Ormond [place] Phone: [phone] Work Phone: [phone] Relation: Niece Mother: Majed, [place] Phone: [phone]  Time spent: > 35 mins  Colbert Ewing, MD [facility] Pager [phone]  If 7PM-7AM, please contact night-coverage [email or web address] Password [facility] 05/03/2018, 9:46 PM

## 2018-05-04 ENCOUNTER — Other Ambulatory Visit: Payer: Self-pay

## 2018-05-04 DIAGNOSIS — M109 Gout, unspecified: Principal | ICD-10-CM

## 2018-05-04 DIAGNOSIS — R509 Fever, unspecified: Secondary | ICD-10-CM

## 2018-05-04 LAB — CBC WITH DIFFERENTIAL/PLATELET
Abs Immature Granulocytes: 0.04 10*3/uL (ref 0.00–0.07)
Basophils Absolute: 0 10*3/uL (ref 0.0–0.1)
Basophils Relative: 0 %
Eosinophils Absolute: 0 10*3/uL (ref 0.0–0.5)
Eosinophils Relative: 0 %
HCT: 34 % — ABNORMAL LOW (ref 39.0–52.0)
Hemoglobin: 10.7 g/dL — ABNORMAL LOW (ref 13.0–17.0)
Immature Granulocytes: 1 %
LYMPHS ABS: 0.6 10*3/uL — AB (ref 0.7–4.0)
Lymphocytes Relative: 7 %
MCH: 25.8 pg — ABNORMAL LOW (ref 26.0–34.0)
MCHC: 31.5 g/dL (ref 30.0–36.0)
MCV: 82.1 fL (ref 80.0–100.0)
Monocytes Absolute: 0.2 10*3/uL (ref 0.1–1.0)
Monocytes Relative: 3 %
NRBC: 0 % (ref 0.0–0.2)
Neutro Abs: 7.7 10*3/uL (ref 1.7–7.7)
Neutrophils Relative %: 89 %
Platelets: 144 10*3/uL — ABNORMAL LOW (ref 150–400)
RBC: 4.14 MIL/uL — ABNORMAL LOW (ref 4.22–5.81)
RDW: 14.3 % (ref 11.5–15.5)
WBC: 8.6 10*3/uL (ref 4.0–10.5)

## 2018-05-04 LAB — BASIC METABOLIC PANEL
Anion gap: 11 (ref 5–15)
BUN: 25 mg/dL — ABNORMAL HIGH (ref 8–23)
CO2: 19 mmol/L — ABNORMAL LOW (ref 22–32)
Calcium: 8.7 mg/dL — ABNORMAL LOW (ref 8.9–10.3)
Chloride: 108 mmol/L (ref 98–111)
Creatinine, Ser: 1.49 mg/dL — ABNORMAL HIGH (ref 0.61–1.24)
GFR calc non Af Amer: 50 mL/min — ABNORMAL LOW (ref >60)
GFR, EST AFRICAN AMERICAN: 58 mL/min — AB (ref >60)
Glucose, Bld: 155 mg/dL — ABNORMAL HIGH (ref 70–99)
Potassium: 4 mmol/L (ref 3.5–5.1)
SODIUM: 138 mmol/L (ref 135–145)

## 2018-05-04 LAB — HIV ANTIBODY (ROUTINE TESTING W REFLEX): HIV Screen 4th Generation wRfx: NONREACTIVE

## 2018-05-04 LAB — MAGNESIUM: Magnesium: 2.2 mg/dL (ref 1.7–2.4)

## 2018-05-04 MED ORDER — PREDNISONE 50 MG PO TABS
60.0000 mg | ORAL_TABLET | Freq: Every day | ORAL | Status: DC
  Administered 2018-05-04 – 2018-05-05 (×2): 60 mg via ORAL
  Filled 2018-05-04 (×2): qty 1

## 2018-05-04 MED ORDER — COLCHICINE 0.6 MG PO TABS
0.6000 mg | ORAL_TABLET | Freq: Every day | ORAL | Status: DC
  Administered 2018-05-04 – 2018-05-06 (×3): 0.6 mg via ORAL
  Filled 2018-05-04 (×3): qty 1

## 2018-05-04 MED ORDER — ALLOPURINOL 100 MG PO TABS
100.0000 mg | ORAL_TABLET | Freq: Every day | ORAL | Status: DC
  Administered 2018-05-04 – 2018-05-06 (×3): 100 mg via ORAL
  Filled 2018-05-04 (×3): qty 1

## 2018-05-04 MED ORDER — LACTULOSE 10 GM/15ML PO SOLN
10.0000 g | Freq: Two times a day (BID) | ORAL | Status: DC
  Administered 2018-05-04 – 2018-05-06 (×4): 10 g via ORAL
  Filled 2018-05-04 (×4): qty 15

## 2018-05-04 NOTE — Evaluation (Signed)
Physical Therapy Evaluation Patient Details Name: John [name] MRN: [id number] DOB: Jun 17, 1955 Today's Date: 05/04/2018   History of Present Illness  63 yo male admitted with left U/LE pain, inability to ambulate. PMH:HTN, stroke, etoh, CHF,Polysubstance abuse, liver cirrhosis, GIB,CKD.   Clinical Impression  The patient  Presents with significant lack of ROM and pain of the left LE and left UE. Patient reports normal community ambulator prior to onset of symptoms. Pt admitted with above diagnosis. Pt currently with functional limitations due to the deficits listed below (see PT Problem List). Pt will benefit from skilled PT to increase their independence and safety with mobility to allow discharge to the venue listed below.       Follow Up Recommendations SNF(unless ambulatory)    Equipment Recommendations  Rolling walker with 5" wheels;Wheelchair (measurements PT)    Recommendations for Other Services       Precautions / Restrictions Precautions Precautions: Fall Precaution Comments: severe left leg pain      Mobility  Bed Mobility Overal bed mobility: Modified Independent                Transfers                 General transfer comment: Not tested due to decreased tolerance to any pressure on the left leg  Ambulation/Gait                Stairs            Wheelchair Mobility    Modified Rankin (Stroke Patients Only)       Balance                                             Pertinent Vitals/Pain Pain Assessment: 0-10 Pain Score: 10-Worst pain ever Pain Location: right leg, especially knee, left wrist and hand Pain Descriptors / Indicators: Cramping;Discomfort;Guarding;Grimacing;Restless;Tender Pain Intervention(s): Monitored during session;Premedicated before session    [place] expects to be discharged to:: Private residence Living Arrangements: Alone Available Help at Discharge: Personal  care attendant Type of Home: [place] - single point;Walker - 2 wheels      Prior Function Level of Independence: Independent               Hand Dominance        Extremity/Trunk Assessment   Upper Extremity Assessment Upper Extremity Assessment: Defer to OT evaluation    Lower Extremity Assessment Lower Extremity Assessment: LLE deficits/detail;RLE deficits/detail RLE Deficits / Details: grossly WFL LLE Deficits / Details: initially lacked knee extension, patient gradually increased to 30-50 knee flexion. patient has no AROM at the ankle, ? pain LLE: Unable to fully assess due to pain    Cervical / Trunk Assessment Cervical / Trunk Assessment: Normal  Communication   Communication: No difficulties  Cognition Arousal/Alertness: Awake/alert Behavior During Therapy: WFL for tasks assessed/performed Overall Cognitive Status: Within Functional Limits for tasks assessed                                        General Comments      Exercises General Exercises - Lower Extremity Quad Sets: AROM;5 reps;Supine Other Exercises Other Exercises: use of belt to assist with left knee  extension   Assessment/Plan    PT Assessment Patient needs continued PT services  PT Problem List Decreased strength;Decreased range of motion;Decreased mobility;Decreased knowledge of use of DME;Pain;Decreased activity tolerance       PT Treatment Interventions DME instruction;Functional mobility training;Gait training;Therapeutic exercise;Therapeutic activities;Patient/family education    PT Goals (Current goals can be found in the Care Plan section)  Acute Rehab PT Goals Patient Stated Goal: to walk again PT Goal Formulation: With patient Time For Goal Achievement: 05/18/18 Potential to Achieve Goals: Good    Frequency Min 3X/week   Barriers to discharge Decreased caregiver support      Co-evaluation                AM-PAC PT "6 Clicks" Mobility  Outcome Measure Help needed turning from your back to your side while in a flat bed without using bedrails?: None Help needed moving from lying on your back to sitting on the side of a flat bed without using bedrails?: None Help needed moving to and from a bed to a chair (including a wheelchair)?: Total Help needed standing up from a chair using your arms (e.g., wheelchair or bedside chair)?: Total Help needed to walk in hospital room?: Total Help needed climbing 3-5 steps with a railing? : Total 6 Click Score: 12    End of Session   Activity Tolerance: Patient limited by pain Patient left: in bed;with call bell/phone within reach Nurse Communication: Mobility status;Need for lift equipment PT Visit Diagnosis: Unsteadiness on feet (R26.81);Pain Pain - Right/Left: Left Pain - part of body: Leg    Time: 2549-8264 PT Time Calculation (min) (ACUTE ONLY): 26 min   Charges:   PT Evaluation $PT Eval Low Complexity: 1 Low PT Treatments $Therapeutic Activity: 8-22 mins        Tresa Endo PT Acute Rehabilitation Services Pager [phone] Office [phone]   Claretha Cooper 05/04/2018, 10:18 AM

## 2018-05-04 NOTE — Progress Notes (Signed)
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  Patient noted 2 or 3 days    Assessment & Plan:   Active Problems:   Polyarthralgia  Acute polyarthritis left hand, left foot, left knee.  Right foot Uric acid elevated suspect this is related to gout attack. We will start prednisone, and colchicine. Follow blood cultures. HIV, parvovirus, rheumatoid factor, ANA, Epstein-Barr virus, CMV IgM, RSV, Epstein-Barr virus ;pending. CRP 10, ESR 120  Chronic kidney disease a stage II Baseline creatinine 1.5/1.7. Monitor renal function  Microcytic anemia related to sickle cell trait. Monitor. Start folic acid.  Hypertension, diastolic heart failure. Continue with amlodipine. We will consider resuming Lexis soon.   Hypokalemia. Replace.  History of alcohol abuse No current use.  Monitor on CIWA.  Mildly elevated bilirubin bilirubin trend 1.5-1.9 previously.  Monitor.  Estimated body mass index is 37.25 kg/m as calculated from the following:   Height as of this encounter: '5\' 8"'$  (1.727 m).   Weight as of this encounter: 111.1 kg.   DVT prophylaxis: Lovenox Code Status: Full code Family Communication: Care discussed with patient Disposition Plan: Pain in the hospital for treatment of arthritis patient unable to ambulate.  Will need PT.  Awaiting multiple test results.   Consultants:   None   Procedures:   None  Antimicrobials: None  Subjective: Still complaining of significant pain, he is very tender to gentle palpation to his foot.   Objective: Vitals:   05/03/18 2000 05/03/18 2135 05/03/18 2215  05/04/18 0531  BP: 137/84 125/77 135/84 (!) 143/95  Pulse: 71 66 68 63  Resp: '17 16 17 17  '$ Temp:   98.1 F (36.7 C) 97.6 F (36.4 C)  TempSrc:   Oral Oral  SpO2: 98% 96% 98% 97%  Weight:      Height:        Intake/Output Summary (Last 24 hours) at 05/04/2018 1207 Last data filed at 05/04/2018 0532 Gross per 24 hour  Intake -  Output 400 ml  Net -400 ml   Filed Weights   05/03/18 1510  Weight: 111.1 kg    Examination:  General exam: Appears calm and comfortable  Respiratory system: Clear to auscultation. Respiratory effort normal. Cardiovascular system: S1 & S2 heard, RRR. No JVD, murmurs, rubs, gallops or clicks. No pedal edema. Gastrointestinal system: Abdomen is nondistended, soft and nontender. No organomegaly or masses felt. Normal bowel sounds heard. Central nervous system: Alert and oriented. No focal neurological deficits. Extremities: Edema left hand, very tender on palpation to the knee and to the left foot. Skin: No rashes, lesions or ulcers Psychiatry: Judgement and insight appear normal. Mood & affect appropriate.     Data Reviewed: I have personally reviewed following labs and imaging studies  CBC: Recent Labs  Lab 05/03/18 1628 05/04/18 0605  WBC 12.1* 8.6  NEUTROABS 9.9* 7.7  HGB 10.7* 10.7*  HCT 32.7* 34.0*  MCV 79.6* 82.1  PLT 173 950*   Basic Metabolic Panel: Recent Labs  Lab 05/03/18 1628 05/04/18 0605  NA 136 138  K 3.2* 4.0  CL 103 108  CO2 21* 19*  GLUCOSE 103* 155*  BUN 25*  25*  CREATININE 1.66* 1.49*  CALCIUM 8.7* 8.7*  MG  --  2.2   GFR: Estimated Creatinine Clearance: 63 mL/min (A) (by C-G formula based on SCr of 1.49 mg/dL (H)). Liver Function Tests: Recent Labs  Lab 05/03/18 1628  AST 35  ALT 20  ALKPHOS 62  BILITOT 1.5*  PROT 8.0  ALBUMIN 3.3*   No results for input(s): LIPASE, AMYLASE in the last 168 hours. No results for input(s): AMMONIA in the last 168 hours. Coagulation Profile: No results for input(s):  INR, PROTIME in the last 168 hours. Cardiac Enzymes: Recent Labs  Lab 05/03/18 1628  CKTOTAL 222   BNP (last 3 results) No results for input(s): PROBNP in the last 8760 hours. HbA1C: No results for input(s): HGBA1C in the last 72 hours. CBG: No results for input(s): GLUCAP in the last 168 hours. Lipid Profile: No results for input(s): CHOL, HDL, LDLCALC, TRIG, CHOLHDL, LDLDIRECT in the last 72 hours. Thyroid Function Tests: No results for input(s): TSH, T4TOTAL, FREET4, T3FREE, THYROIDAB in the last 72 hours. Anemia Panel: No results for input(s): VITAMINB12, FOLATE, FERRITIN, TIBC, IRON, RETICCTPCT in the last 72 hours. Sepsis Labs: Recent Labs  Lab 05/03/18 1635  LATICACIDVEN 1.39    No results found for this or any previous visit (from the past 240 hour(s)).       Radiology Studies: Dg Chest 2 View  Result Date: 05/03/2018 CLINICAL DATA:  Weakness, chills, generalized body aches, burning sensation LEFT knee, pain and swelling LEFT hand, history CHF, stroke, cirrhosis, GERD, hypertension, substance abuse EXAM: CHEST - 2 VIEW COMPARISON:  12/25/2016 FINDINGS: Enlargement of cardiac silhouette with pulmonary vascular congestion. Mediastinal contours normal. Lungs clear. No infiltrate, pleural effusion, or pneumothorax. Respiratory motion artifacts degrade lateral view. No acute osseous findings. IMPRESSION: Enlargement of cardiac silhouette with slight vascular congestion. No acute abnormalities. Electronically Signed   By: Lavonia Dana M.D.   On: 05/03/2018 17:02   Dg Knee Complete 4 Views Left  Result Date: 05/03/2018 CLINICAL DATA:  Burning sensation LEFT knee EXAM: LEFT KNEE - COMPLETE 4+ VIEW COMPARISON:  None FINDINGS: Osseous demineralization. Medial compartment joint space narrowing. Small knee joint effusion. No acute fracture, dislocation, or bone destruction. IMPRESSION: Mild degenerative changes and small joint effusion LEFT knee. No acute abnormalities. Electronically  Signed   By: Lavonia Dana M.D.   On: 05/03/2018 17:03   Dg Hand Complete Left  Result Date: 05/03/2018 CLINICAL DATA:  LEFT hand pain and swelling EXAM: LEFT HAND - COMPLETE 3+ VIEW COMPARISON:  None FINDINGS: Osseous demineralization. Joint spaces preserved. Diffuse soft tissue swelling throughout LEFT hand wrist. No fracture, dislocation, or bone destruction. IMPRESSION: Soft tissue swelling and osseous demineralization without acute bony abnormalities. Electronically Signed   By: Lavonia Dana M.D.   On: 05/03/2018 17:03        Scheduled Meds: . allopurinol  100 mg Oral Daily  . amLODipine  10 mg Oral Daily  . colchicine  0.6 mg Oral Daily  . enoxaparin (LOVENOX) injection  40 mg Subcutaneous QHS  . famotidine  40 mg Oral Daily  . lactulose  10 g Oral BID  . [START ON 05/05/2018] predniSONE  60 mg Oral Q breakfast   Continuous Infusions:   LOS: 1 day    Time spent: 35 minutes.     Elmarie Shiley, MD [facility] Pager [phone]  If 7PM-7AM, please contact night-coverage [email or web address] Password [facility] 05/04/2018, 12:07 PM

## 2018-05-04 NOTE — Evaluation (Signed)
Occupational Therapy Evaluation Patient Details Name: John [name] MRN: [id number] DOB: 11/11/1955 Today's Date: 05/04/2018    History of Present Illness 63 yo male admitted with left U/LE pain, inability to ambulate. PMH:HTN, stroke, etoh, CHF,Polysubstance abuse, liver cirrhosis, GIB,CKD.    Clinical Impression   Pt admitted with gout. Pt currently with functional limitations due to the deficits listed below (see OT Problem List).  Pt will benefit from skilled OT to increase their safety and independence with ADL and functional mobility for ADL to facilitate discharge to venue listed below.      Follow Up Recommendations  Home health OT;SNF;Supervision/Assistance - 24 hour(depending on progress)    Equipment Recommendations  3 in 1 bedside commode       Precautions / Restrictions Precautions Precautions: Fall Precaution Comments: severe left leg pain      Mobility Bed Mobility Overal bed mobility: Needs Assistance Bed Mobility: Supine to Sit;Sit to Supine   Sidelying to sit: Min guard Supine to sit: Min guard        Transfers                 General transfer comment: declined standing        ADL either performed or assessed with clinical judgement   ADL Overall ADL's : Needs assistance/impaired Eating/Feeding: Set up;Sitting   Grooming: Set up;Sitting   Upper Body Bathing: Set up;Sitting   Lower Body Bathing: Sitting/lateral leans;Maximal assistance   Upper Body Dressing : Minimal assistance;Sitting   Lower Body Dressing: Maximal assistance;Sitting/lateral leans                                    Pertinent Vitals/Pain Pain Score: 6  Pain Location: right leg, especially knee, left wrist and hand Pain Descriptors / Indicators: Cramping;Discomfort;Guarding;Grimacing;Restless;Tender Pain Intervention(s): Limited activity within patient's tolerance;Repositioned     Hand Dominance     Extremity/Trunk Assessment Upper Extremity  Assessment Upper Extremity Assessment: Generalized weakness LUE Deficits / Details: Initially LUE with limited movement but with AAROM pt with full ROM against gravity with weakness           Communication Communication Communication: No difficulties   Cognition Arousal/Alertness: Awake/alert Behavior During Therapy: WFL for tasks assessed/performed Overall Cognitive Status: Within Functional Limits for tasks assessed                                                Home Living Family/patient expects to be discharged to:: Private residence Living Arrangements: Alone Available Help at Discharge: Personal care attendant Type of Home: Apartment Home Access: Elevator     Home Layout: One level     Bathroom Shower/Tub: Teacher, early years/pre: Handicapped height     Home Equipment: [place] - single point;Walker - 2 wheels          Prior Functioning/Environment Level of Independence: Independent                 OT Problem List: Decreased strength;Decreased activity tolerance;Decreased safety awareness;Decreased knowledge of use of DME or AE;Pain;Impaired balance (sitting and/or standing)      OT Treatment/Interventions: Self-care/ADL training;DME and/or AE instruction;Therapeutic activities;Patient/family education;Therapeutic exercise    OT Goals(Current goals can be found in the care plan section) Acute Rehab OT Goals Patient Stated Goal: to  walk again OT Goal Formulation: With patient Time For Goal Achievement: 05/18/18  OT Frequency: Min 2X/week   Barriers to D/C:               AM-PAC OT "6 Clicks" Daily Activity     Outcome Measure Help from another person eating meals?: None Help from another person taking care of personal grooming?: A Little Help from another person toileting, which includes using toliet, bedpan, or urinal?: Total Help from another person bathing (including washing, rinsing, drying)?: A Lot Help from  another person to put on and taking off regular upper body clothing?: A Little Help from another person to put on and taking off regular lower body clothing?: Total 6 Click Score: 14   End of Session Nurse Communication: Mobility status  Activity Tolerance: Patient limited by lethargy Patient left: in bed;with call bell/phone within reach  OT Visit Diagnosis: Unsteadiness on feet (R26.81);History of falling (Z91.81);Muscle weakness (generalized) (M62.81)                Time: 6701-4103 OT Time Calculation (min): 12 min Charges:  OT General Charges $OT Visit: 1 Visit OT Evaluation $OT Eval Moderate Complexity: 1 Mod  Kari Baars, [place] [phone] Office- [phone]     Shamina Etheridge, Edwena Felty D 05/04/2018, 4:20 PM

## 2018-05-04 NOTE — Plan of Care (Signed)
  Problem: Coping: Goal: Level of anxiety will decrease Outcome: Progressing   Problem: Health Behavior/Discharge Planning: Goal: Ability to manage health-related needs will improve Outcome: Progressing   Problem: Pain Managment: Goal: General experience of comfort will improve Outcome: Progressing   Problem: Safety: Goal: Ability to remain free from injury will improve Outcome: Progressing

## 2018-05-05 LAB — CBC WITH DIFFERENTIAL/PLATELET
Abs Immature Granulocytes: 0.1 10*3/uL — ABNORMAL HIGH (ref 0.00–0.07)
Basophils Absolute: 0 10*3/uL (ref 0.0–0.1)
Basophils Relative: 0 %
Eosinophils Absolute: 0 10*3/uL (ref 0.0–0.5)
Eosinophils Relative: 0 %
HCT: 31.7 % — ABNORMAL LOW (ref 39.0–52.0)
Hemoglobin: 10.3 g/dL — ABNORMAL LOW (ref 13.0–17.0)
Immature Granulocytes: 1 %
LYMPHS ABS: 0.9 10*3/uL (ref 0.7–4.0)
Lymphocytes Relative: 7 %
MCH: 26.1 pg (ref 26.0–34.0)
MCHC: 32.5 g/dL (ref 30.0–36.0)
MCV: 80.5 fL (ref 80.0–100.0)
Monocytes Absolute: 0.8 10*3/uL (ref 0.1–1.0)
Monocytes Relative: 6 %
Neutro Abs: 12.1 10*3/uL — ABNORMAL HIGH (ref 1.7–7.7)
Neutrophils Relative %: 86 %
Platelets: 143 10*3/uL — ABNORMAL LOW (ref 150–400)
RBC: 3.94 MIL/uL — ABNORMAL LOW (ref 4.22–5.81)
RDW: 14.2 % (ref 11.5–15.5)
WBC: 13.9 10*3/uL — ABNORMAL HIGH (ref 4.0–10.5)
nRBC: 0 % (ref 0.0–0.2)

## 2018-05-05 LAB — URINE CULTURE

## 2018-05-05 LAB — BASIC METABOLIC PANEL
Anion gap: 8 (ref 5–15)
BUN: 32 mg/dL — ABNORMAL HIGH (ref 8–23)
CO2: 22 mmol/L (ref 22–32)
Calcium: 8.7 mg/dL — ABNORMAL LOW (ref 8.9–10.3)
Chloride: 108 mmol/L (ref 98–111)
Creatinine, Ser: 1.43 mg/dL — ABNORMAL HIGH (ref 0.61–1.24)
GFR calc non Af Amer: 52 mL/min — ABNORMAL LOW (ref >60)
Glucose, Bld: 136 mg/dL — ABNORMAL HIGH (ref 70–99)
Potassium: 4.4 mmol/L (ref 3.5–5.1)
Sodium: 138 mmol/L (ref 135–145)

## 2018-05-05 LAB — RHEUMATOID FACTOR: Rheumatoid fact SerPl-aCnc: 13.7 IU/mL (ref 0.0–13.9)

## 2018-05-05 LAB — MAGNESIUM: Magnesium: 2.4 mg/dL (ref 1.7–2.4)

## 2018-05-05 LAB — B. BURGDORFI ANTIBODIES: B burgdorferi Ab IgG+IgM: <0.91 {ISR} (ref 0.00–0.90)

## 2018-05-05 LAB — EPSTEIN-BARR VIRUS VCA, IGG: EBV VCA IgG: >600 U/mL — ABNORMAL HIGH (ref 0.0–17.9)

## 2018-05-05 LAB — CMV IGM: CMV IgM: <30 AU/mL (ref 0.0–29.9)

## 2018-05-05 LAB — ANA W/REFLEX IF POSITIVE: Anti Nuclear Antibody(ANA): NEGATIVE

## 2018-05-05 LAB — EPSTEIN-BARR VIRUS VCA, IGM

## 2018-05-05 MED ORDER — FOLIC ACID 1 MG PO TABS
1.0000 mg | ORAL_TABLET | Freq: Every day | ORAL | Status: DC
  Administered 2018-05-05 – 2018-05-06 (×2): 1 mg via ORAL
  Filled 2018-05-05 (×2): qty 1

## 2018-05-05 MED ORDER — PREDNISONE 20 MG PO TABS
40.0000 mg | ORAL_TABLET | Freq: Every day | ORAL | Status: DC
  Administered 2018-05-06: 40 mg via ORAL
  Filled 2018-05-05: qty 2

## 2018-05-05 NOTE — Progress Notes (Signed)
Occupational Therapy Treatment Patient Details Name: John [name] MRN: [id number] DOB: Feb 11, 1956 Today's Date: 05/05/2018    History of present illness 63 yo male admitted with left U/LE pain, inability to ambulate. PMH:HTN, stroke, etoh, CHF,Polysubstance abuse, liver cirrhosis, GIB,CKD.    OT comments  Pt MUCH improved this day!    Follow Up Recommendations  Home health OT;Supervision - Intermittent(depending on progress)    Equipment Recommendations  3 in 1 bedside commode    Recommendations for Other Services      Precautions / Restrictions         Mobility Bed Mobility Overal bed mobility: Modified Independent                Transfers Overall transfer level: Needs assistance Equipment used: Rolling walker (2 wheeled) Transfers: Sit to/from Stand;Stand Pivot Transfers Sit to Stand: Supervision Stand pivot transfers: Supervision                ADL either performed or assessed with clinical judgement   ADL Overall ADL's : Needs assistance/impaired Eating/Feeding: Set up;Sitting   Grooming: Set up;Sitting   Upper Body Bathing: Set up;Sitting   Lower Body Bathing: Set up;Cueing for sequencing;Cueing for safety;Sit to/from stand   Upper Body Dressing : Sitting;Set up   Lower Body Dressing: Maximal assistance;Minimal assistance;Sit to/from stand;Cueing for sequencing;Cueing for safety   Toilet Transfer: Supervision/safety;RW;Ambulation;Comfort height toilet   Toileting- Clothing Manipulation and Hygiene: Supervision/safety;Sit to/from stand;Cueing for sequencing;Cueing for safety         General ADL Comments: Pt much improved this day!  Decreased pain and increased mobilty     Vision Patient Visual Report: No change from baseline     Perception     Praxis      Cognition Arousal/Alertness: Awake/alert Behavior During Therapy: WFL for tasks assessed/performed Overall Cognitive Status: Within Functional Limits for tasks assessed                                                      Pertinent Vitals/ Pain       Pain Assessment: No/denies pain         Frequency  Min 2X/week        Progress Toward Goals  OT Goals(current goals can now be found in the care plan section)  Progress towards OT goals: Progressing toward goals     Plan Discharge plan needs to be updated       AM-PAC OT "6 Clicks" Daily Activity     Outcome Measure   Help from another person eating meals?: None Help from another person taking care of personal grooming?: A Little Help from another person toileting, which includes using toliet, bedpan, or urinal?: A Little Help from another person bathing (including washing, rinsing, drying)?: A Little Help from another person to put on and taking off regular upper body clothing?: A Little Help from another person to put on and taking off regular lower body clothing?: A Little 6 Click Score: 19    End of Session Equipment Utilized During Treatment: Rolling walker  OT Visit Diagnosis: Unsteadiness on feet (R26.81);History of falling (Z91.81);Muscle weakness (generalized) (M62.81)   Activity Tolerance Patient tolerated treatment well   Patient Left with call bell/phone within reach;in chair   Nurse Communication Mobility status        Time: 9417-4081 OT Time Calculation (  min): 28 min  Charges: OT General Charges $OT Visit: 1 Visit OT Treatments $Self Care/Home Management : 23-37 mins  Kari Baars, [place] [phone] Office- [phone]      [place], Edwena Felty D 05/05/2018, 1:22 PM

## 2018-05-05 NOTE — Progress Notes (Signed)
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  Patient noted 2 or 3 days.  Patient admitted with polyarthritis, gout related to gout attack.  Awaiting final cultures results. Patient might be able to be discharged in 24 hours.    Assessment & Plan:   Active Problems:   Polyarthralgia  Acute polyarthritis left hand, left foot, left knee.  Right foot Uric acid elevated suspect this is related to gout attack. Patient was started  prednisone, and colchicine.  Patient has improved since then. blood cultures no growth in the first 24 hours.Marland Kitchen HIV, parvovirus, rheumatoid factor negative, ANA negative, Epstein-Barr virus, CMV IgM negative, RSV, Epstein-Barr virus ;pending. CRP 10, ESR 120  Chronic kidney disease a stage II Baseline creatinine 1.5/1.7. Monitor renal function Stable   Microcytic anemia related to sickle cell trait. Monitor. Start folic acid.  Hypertension, diastolic heart failure. Continue with amlodipine. We will consider resuming Lexis soon.   Hypokalemia. Replace.  History of alcohol abuse No current use.  Monitor on CIWA.  Mildly elevated bilirubin bilirubin trend 1.5-1.9 previously.  Monitor.  Estimated body mass index is 37.25 kg/m as calculated from the following:   Height as of this encounter: '5\' 8"'$  (1.727 m).   Weight as of this encounter: 111.1 kg.   DVT prophylaxis: Lovenox Code Status: Full code Family Communication: Care discussed with patient Disposition Plan: Home in 24 hour, blood cultures, repeat CBC in the morning   Consultants:   None   Procedures:    None  Antimicrobials: None  Subjective: He is feeling better, reports improvement of pain.  Does not feel that he is ready to be discharged today  Objective: Vitals:   05/04/18 1429 05/04/18 2058 05/05/18 0654 05/05/18 1533  BP: 130/89 122/74 116/83 118/66  Pulse: (!) 57 (!) 58 66 60  Resp: '14 14 16 16  '$ Temp: 97.9 F (36.6 C) 98 F (36.7 C) (!) 97.4 F (36.3 C) 97.7 F (36.5 C)  TempSrc: Oral Oral Oral Oral  SpO2: 100% 97% 96% 95%  Weight:      Height:        Intake/Output Summary (Last 24 hours) at 05/05/2018 1601 Last data filed at 05/05/2018 0654 Gross per 24 hour  Intake 120 ml  Output 400 ml  Net -280 ml   Filed Weights   05/03/18 1510  Weight: 111.1 kg    Examination:  General exam: Not acute distress Respiratory system: Clear to auscultation Cardiovascular system: S1, S2, regular rhythm and rate. Gastrointestinal system: Bowel sounds present, soft nontender nondistended Central nervous system: Alert and oriented Extremities; left hand with less edema less tender to palpation, left foot, big toe with less edema and less tenderness    Data Reviewed: I have personally reviewed following labs and imaging studies  CBC: Recent Labs  Lab 05/03/18 1628 05/04/18 0605 05/05/18 0603  WBC 12.1* 8.6 13.9*  NEUTROABS 9.9* 7.7 12.1*  HGB 10.7* 10.7* 10.3*  HCT 32.7* 34.0* 31.7*  MCV 79.6* 82.1 80.5  PLT 173 144* 726*   Basic Metabolic Panel: Recent Labs  Lab 05/03/18 1628 05/04/18 0605 05/05/18 0603  NA 136 138 138  K  3.2* 4.0 4.4  CL 103 108 108  CO2 21* 19* 22  GLUCOSE 103* 155* 136*  BUN 25* 25* 32*  CREATININE 1.66* 1.49* 1.43*  CALCIUM 8.7* 8.7* 8.7*  MG  --  2.2 2.4   GFR: Estimated Creatinine Clearance: 65.6 mL/min (A) (by C-G formula based on SCr of 1.43 mg/dL (H)). Liver Function Tests: Recent Labs  Lab 05/03/18 1628  AST 35  ALT 20  ALKPHOS 62  BILITOT 1.5*  PROT 8.0  ALBUMIN 3.3*   No results for input(s): LIPASE, AMYLASE  in the last 168 hours. No results for input(s): AMMONIA in the last 168 hours. Coagulation Profile: No results for input(s): INR, PROTIME in the last 168 hours. Cardiac Enzymes: Recent Labs  Lab 05/03/18 1628  CKTOTAL 222   BNP (last 3 results) No results for input(s): PROBNP in the last 8760 hours. HbA1C: No results for input(s): HGBA1C in the last 72 hours. CBG: No results for input(s): GLUCAP in the last 168 hours. Lipid Profile: No results for input(s): CHOL, HDL, LDLCALC, TRIG, CHOLHDL, LDLDIRECT in the last 72 hours. Thyroid Function Tests: No results for input(s): TSH, T4TOTAL, FREET4, T3FREE, THYROIDAB in the last 72 hours. Anemia Panel: No results for input(s): VITAMINB12, FOLATE, FERRITIN, TIBC, IRON, RETICCTPCT in the last 72 hours. Sepsis Labs: Recent Labs  Lab 05/03/18 1635  LATICACIDVEN 1.39    Recent Results (from the past 240 hour(s))  Urine culture     Status: Abnormal   Collection Time: 05/03/18  4:28 PM  Result Value Ref Range Status   Specimen Description   Final    URINE, RANDOM Performed at [facility] [address] [place], [place] [address]    Special Requests   Final    NONE Performed at [facility], [place] [address], [place], [place] [address]    Culture MULTIPLE SPECIES PRESENT, SUGGEST RECOLLECTION (A)  Final   Report Status 05/05/2018 FINAL  Final  Culture, blood (routine x 2)     Status: None (Preliminary result)   Collection Time: 05/03/18  9:03 PM  Result Value Ref Range Status   Specimen Description   Final    BLOOD LEFT ANTECUBITAL Performed at [facility] [address], [place], [place] [address]    Special Requests   Final    BOTTLES DRAWN AEROBIC AND ANAEROBIC Blood Culture adequate volume Performed at [facility] [address], [place], [place] [address]    Culture   Final    NO GROWTH 1 DAY Performed at [facility], [place] [address], [place], [place] [address]    Report Status PENDING  Incomplete  Culture, blood (routine x 2)     Status: None (Preliminary result)   Collection Time: 05/03/18  9:08 PM  Result Value Ref Range Status   Specimen Description   Final    BLOOD LEFT ANTECUBITAL Performed at [facility] [address] [place], [place] [address]    Special Requests   Final    BOTTLES DRAWN AEROBIC AND ANAEROBIC Blood Culture adequate volume Performed at [facility] [address], [place], [place] [address]    Culture   Final    NO GROWTH 1 DAY Performed at [facility], [place] [address], [place], [place] [address]    Report Status PENDING  Incomplete         Radiology Studies: Dg Chest 2 View  Result Date: 05/03/2018  CLINICAL DATA:  Weakness, chills, generalized body aches, burning sensation LEFT knee, pain and swelling LEFT hand, history CHF, stroke, cirrhosis, GERD, hypertension, substance abuse EXAM: CHEST - 2 VIEW COMPARISON:  12/25/2016 FINDINGS: Enlargement of cardiac silhouette with pulmonary vascular congestion. Mediastinal contours normal. Lungs clear. No infiltrate, pleural effusion, or pneumothorax. Respiratory motion artifacts degrade lateral view. No acute osseous findings. IMPRESSION: Enlargement of cardiac silhouette with slight vascular congestion. No acute abnormalities. Electronically Signed   By: Lavonia Dana M.D.   On: 05/03/2018 17:02   Dg Knee Complete 4 Views Left  Result Date: 05/03/2018 CLINICAL DATA:  Burning sensation LEFT knee EXAM: LEFT KNEE - COMPLETE 4+ VIEW COMPARISON:  None FINDINGS: Osseous demineralization. Medial compartment joint space narrowing. Small knee joint effusion. No acute fracture, dislocation, or bone destruction. IMPRESSION: Mild degenerative changes and small joint effusion LEFT knee. No acute abnormalities. Electronically Signed   By: Lavonia Dana M.D.   On: 05/03/2018 17:03   Dg Hand Complete Left  Result Date:  05/03/2018 CLINICAL DATA:  LEFT hand pain and swelling EXAM: LEFT HAND - COMPLETE 3+ VIEW COMPARISON:  None FINDINGS: Osseous demineralization. Joint spaces preserved. Diffuse soft tissue swelling throughout LEFT hand wrist. No fracture, dislocation, or bone destruction. IMPRESSION: Soft tissue swelling and osseous demineralization without acute bony abnormalities. Electronically Signed   By: Lavonia Dana M.D.   On: 05/03/2018 17:03        Scheduled Meds: . allopurinol  100 mg Oral Daily  . amLODipine  10 mg Oral Daily  . colchicine  0.6 mg Oral Daily  . enoxaparin (LOVENOX) injection  40 mg Subcutaneous QHS  . famotidine  40 mg Oral Daily  . lactulose  10 g Oral BID  . predniSONE  60 mg Oral Q breakfast   Continuous Infusions:   LOS: 2 days    Time spent: 35 minutes.     Elmarie Shiley, MD [facility] Pager [phone]  If 7PM-7AM, please contact night-coverage [email or web address] Password [email or web address] 05/05/2018, 4:01 PM

## 2018-05-06 DIAGNOSIS — K7031 Alcoholic cirrhosis of liver with ascites: Secondary | ICD-10-CM

## 2018-05-06 DIAGNOSIS — I10 Essential (primary) hypertension: Secondary | ICD-10-CM

## 2018-05-06 DIAGNOSIS — N182 Chronic kidney disease, stage 2 (mild): Secondary | ICD-10-CM

## 2018-05-06 DIAGNOSIS — M109 Gout, unspecified: Secondary | ICD-10-CM | POA: Diagnosis present

## 2018-05-06 LAB — CBC WITH DIFFERENTIAL/PLATELET
Abs Immature Granulocytes: 0.17 10*3/uL — ABNORMAL HIGH (ref 0.00–0.07)
Basophils Absolute: 0 10*3/uL (ref 0.0–0.1)
Basophils Relative: 0 %
Eosinophils Absolute: 0 10*3/uL (ref 0.0–0.5)
Eosinophils Relative: 0 %
HCT: 31.7 % — ABNORMAL LOW (ref 39.0–52.0)
Hemoglobin: 10.3 g/dL — ABNORMAL LOW (ref 13.0–17.0)
Immature Granulocytes: 2 %
Lymphocytes Relative: 12 %
Lymphs Abs: 1.2 10*3/uL (ref 0.7–4.0)
MCH: 26.8 pg (ref 26.0–34.0)
MCHC: 32.5 g/dL (ref 30.0–36.0)
MCV: 82.6 fL (ref 80.0–100.0)
Monocytes Absolute: 0.9 10*3/uL (ref 0.1–1.0)
Monocytes Relative: 8 %
NEUTROS ABS: 8.1 10*3/uL — AB (ref 1.7–7.7)
Neutrophils Relative %: 78 %
Platelets: 154 10*3/uL (ref 150–400)
RBC: 3.84 MIL/uL — ABNORMAL LOW (ref 4.22–5.81)
RDW: 14.6 % (ref 11.5–15.5)
WBC: 10.3 10*3/uL (ref 4.0–10.5)
nRBC: 0 % (ref 0.0–0.2)

## 2018-05-06 LAB — MAGNESIUM: Magnesium: 2.6 mg/dL — ABNORMAL HIGH (ref 1.7–2.4)

## 2018-05-06 LAB — BASIC METABOLIC PANEL
Anion gap: 7 (ref 5–15)
BUN: 34 mg/dL — ABNORMAL HIGH (ref 8–23)
CO2: 23 mmol/L (ref 22–32)
Calcium: 8.6 mg/dL — ABNORMAL LOW (ref 8.9–10.3)
Chloride: 109 mmol/L (ref 98–111)
Creatinine, Ser: 1.33 mg/dL — ABNORMAL HIGH (ref 0.61–1.24)
GFR calc Af Amer: >60 mL/min (ref >60)
GFR calc non Af Amer: 57 mL/min — ABNORMAL LOW (ref >60)
Glucose, Bld: 103 mg/dL — ABNORMAL HIGH (ref 70–99)
POTASSIUM: 4.2 mmol/L (ref 3.5–5.1)
Sodium: 139 mmol/L (ref 135–145)

## 2018-05-06 LAB — PARVOVIRUS B19 ANTIBODY, IGG AND IGM
Parovirus B19 IgG Abs: 1.1 index — ABNORMAL HIGH (ref 0.0–0.8)
Parovirus B19 IgM Abs: 0.2 index (ref 0.0–0.8)

## 2018-05-06 LAB — RSV(RESPIRATORY SYNCYTIAL VIRUS) AB, BLOOD: RSV Ab: 1:8 {titer} — ABNORMAL HIGH

## 2018-05-06 MED ORDER — FOLIC ACID 1 MG PO TABS: 1.0000 mg | ORAL_TABLET | Freq: Every day | ORAL | Status: DC

## 2018-05-06 MED ORDER — PREDNISONE 20 MG PO TABS: 40.0000 mg | ORAL_TABLET | Freq: Every day | ORAL | 0 refills | Status: AC

## 2018-05-06 MED ORDER — COLCHICINE 0.6 MG PO TABS: 0.6000 mg | ORAL_TABLET | Freq: Every day | ORAL | 0 refills | Status: DC

## 2018-05-06 MED ORDER — ALLOPURINOL 100 MG PO TABS: 100.0000 mg | ORAL_TABLET | Freq: Every day | ORAL | 0 refills | Status: DC

## 2018-05-06 NOTE — Care Management Note (Signed)
Case Management Note  Patient Details  Name: Real Cona MRN: [id number] Date of Birth: [birth year]  Subjective/Objective:                  Discharge planning  Action/Plan: hhc-pt ot and aide through [facility] 3 in one and rolling walker through [facility]/dme  Expected Discharge Date:                  Expected Discharge Plan:  [place]  In-House Referral:     Discharge planning Services  CM Consult  Post Acute Care Choice:  Durable Medical Equipment, Home Health Choice offered to:  Patient  DME Arranged:  3-N-1, Walker rolling DME Agency:  [place] Arranged:  PT, OT, Nurse's Aide [place] Agency:  [place]  Status of Service:  Completed, signed off  If discussed at [place] of Stay Meetings, dates discussed:    Additional Comments:  Leeroy Cha, RN 05/06/2018, 11:43 AM

## 2018-05-06 NOTE — Discharge Summary (Signed)
Physician Discharge Summary  John [name] DXI:[id number] DOB: [birth year] DOA: 05/03/2018  PCP: Mindi Curling, PA-C  Admit date: 05/03/2018 Discharge date: 05/06/2018  Time spent: 55 minutes  Recommendations for Outpatient Follow-up:  1. Follow-up with Mindi Curling, PA-C in 1 to 2 weeks.  On follow-up patient will need a basic metabolic profile done to follow-up on electrolytes and renal function.  Patient's acute gouty arthritis will need to be reassessed.   Discharge Diagnoses:  Principal Problem:   Acute gouty arthritis Active Problems:   Polyarthralgia   CKD (chronic kidney disease) stage 2, GFR 60-89 ml/min   Essential hypertension   Alcoholic cirrhosis of liver with ascites ([place])   Discharge Condition: Stable and improved  Diet recommendation: Heart healthy  Filed Weights   05/03/18 1510  Weight: 111.1 kg    History of present illness:  Per Dr. Demetrius Charity Kincheloe is a 63 y.o. male with hx of alcohol abuse, sickle cell trait, CKD stage 2, cirrhosis, HCV (not treated), hx of diastolic CHF who presented with acute hand, knee, and feet swelling x 2 days. Patient noted that 2-3 days ago, he first noticed that his L wrist was slightly swollen -- progressed to increased swelling involving his L hand, with associated severe pain and tenderness. Over the next few days, he developed initial R foot then L foot swelling and then L knee swelling, all with severe pain and tenderness. Stated that he is barely able to ambulate due to the L knee and foot pain because he unable to bear weight on it. His R foot swelling/pain has now nearly resolved (by itself) on the day of admission. Over the past day has also experienced generalized myalgias and subjective fevers. He denied preceding URI sx such as rhinorrhea, sore throat, or cough. He thought that one of his child family members that he is contact with may have had URI symptoms in the past few weeks. He himself reported being in his  usual state of health over the past month until this current presentation of joint pain/edema.  Denied recent foreign travel or exposure to wooded areas. He reported getting the flu shot for this season in late 2019.  Denied prior hx of gout or rheumatological illnesses; denies family hx of autoimmune disease to his knowledge. He currently reports exquisite pain and tenderness over the aforementioned joints.    Review of Systems:  + multiple joint pains as above + subjective fever + malaise - no cough, rhinorrhea, sore throat - no chest pain, dyspnea on exertion - no edema, PND, orthopnea - no nausea/vomiting; no tarry, melanotic or bloody stools - no dysuria, increased urinary frequency - no weight changes Rest of systems reviewed are negative, except as per above history.   ED course:  Vitals Blood pressure 125/77, pulse 66, temperature (!) 100.4 F (38 C), temperature source Oral, resp. rate 16, height 5\' 8"  (1.727 m), weight 111.1 kg, SpO2 96 %. Received IV solumedrol 125mg  x 1; colchicine 0.6mg  x 1; NS bolus x 2L;    Hospital Course:  Acute polyarthritis left hand, left foot, left knee.  Right foot likely secondary to acute gouty arthritis. Patient had presented with polyarthritis of the left hand, left foot, left knee and right foot.  Felt likely secondary to an acute gouty arthritis.  Uric acid was elevated on admission.  Patient given a dose of IV Solu-Medrol in the ED and subsequently started on oral prednisone and colchicine.  Blood cultures were drawn with no growth  to date.  HIV was nonreactive, parvovirus, rheumatoid factor negative, ANA negative, Epstein-Barr virus with elevated IgG however IgM was less than 36.  CRP of 10.  Sed rate of 120. Patient improved clinically during the hospitalization and be discharged home on 5 more days of oral prednisone.  Outpatient follow-up with PCP.  Patient will be started on allopurinol once acute gouty arthritis has been  resolved.  Chronic kidney disease a stage II Baseline creatinine 1.5/1.7. Remained stable throughout the hospitalization.  Outpatient follow-up with PCP.   Microcytic anemia related to sickle cell trait. Hemoglobin remained stable.  Patient started on folic acid.  Outpatient follow-up.   Hypertension, diastolic heart failure. Patient was maintained on Norvasc during the hospitalization.  Patient's diuretics of Lasix were held and will be resumed on discharge.  Outpatient follow-up with PCP.   Hypokalemia. Replaced.  History of alcohol abuse No current use.  Patient was placed on the Ativan withdrawal protocol and had no episodes of withdrawal during the hospitalization.  Patient placed on folic acid.  Outpatient follow-up with PCP.  Mildly elevated bilirubin bilirubin trend 1.5-1.9 previously.  Monitor.  Procedures:  Chest x-ray 05/03/2018  Plain films of the left hand 05/03/2018  Plain films of the left knee 05/03/2018  Consultations:  None  Discharge Exam: Vitals:   05/06/18 0526 05/06/18 1431  BP: (!) 140/95 128/81  Pulse: 64 (!) 59  Resp: 16   Temp: 97.7 F (36.5 C)   SpO2: 96% 98%    General: nad Cardiovascular: RRR Respiratory: CTAB  Discharge Instructions   Discharge Instructions    Diet - low sodium heart healthy   Complete by:  As directed    Increase activity slowly   Complete by:  As directed      Allergies as of 05/06/2018      Reactions   Penicillins Other (See Comments)   Convulsions, Patient could not walk, childhood allergy  Has patient had a PCN reaction causing immediate rash, facial/tongue/throat swelling, SOB or lightheadedness with hypotension: No Has patient had a PCN reaction causing severe rash involving mucus membranes or skin necrosis: No Has patient had a PCN reaction that required hospitalization No Has patient had a PCN reaction occurring within the last 10 years: No If all of the above answers are "NO", then may proceed with  Cephalosporin use.      Medication List    TAKE these medications   allopurinol 100 MG tablet Commonly known as:  ZYLOPRIM Take 1 tablet (100 mg total) by mouth daily. Start taking on:  May 12, 2018   amLODipine 10 MG tablet Commonly known as:  NORVASC Take 1 tablet (10 mg total) by mouth daily.   clobetasol 0.05 % external solution Commonly known as:  TEMOVATE Apply 1 application topically 2 (two) times daily as needed (skin irritation).   colchicine 0.6 MG tablet Take 1 tablet (0.6 mg total) by mouth daily. Start taking on:  May 07, 2018   famotidine 40 MG tablet Commonly known as:  PEPCID Take 40 mg by mouth daily.   folic acid 1 MG tablet Commonly known as:  FOLVITE Take 1 tablet (1 mg total) by mouth daily. Start taking on:  May 07, 2018   furosemide 40 MG tablet Commonly known as:  LASIX Take 40 mg by mouth daily.   hydrALAZINE 50 MG tablet Commonly known as:  APRESOLINE Take 1 tablet (50 mg total) by mouth 3 (three) times daily.   HYDROcodone-acetaminophen 5-325 MG tablet Commonly known  as:  NORCO/VICODIN Take 1 tablet by mouth every 6 (six) hours as needed for moderate pain or severe pain.   ketoconazole 2 % cream Commonly known as:  NIZORAL Apply 1 application topically 2 (two) times daily as needed for irritation.   lactulose 10 GM/15ML solution Commonly known as:  CHRONULAC Take 10 g by mouth daily as needed for mild constipation or moderate constipation.   methocarbamol 500 MG tablet Commonly known as:  ROBAXIN Take 500 mg by mouth every 6 (six) hours as needed for muscle spasms.   omeprazole 40 MG capsule Commonly known as:  PRILOSEC Take 1 capsule (40 mg total) by mouth daily.   predniSONE 20 MG tablet Commonly known as:  DELTASONE Take 2 tablets (40 mg total) by mouth daily with breakfast for 5 days. Start taking on:  May 07, 2018   [facility] HFA 108 (90 Base) MCG/ACT inhaler Generic drug:  albuterol Inhale 1-2 puffs into the  lungs every 6 (six) hours as needed for wheezing or shortness of breath.            Durable Medical Equipment  (From admission, onward)         Start     Ordered   05/06/18 1045  For home use only DME Walker rolling  Once    Question:  Patient needs a walker to treat with the following condition  Answer:  Debilitated   05/06/18 1045   05/06/18 1045  For home use only DME 3 n 1  Once     05/06/18 1045         Allergies  Allergen Reactions  . Penicillins Other (See Comments)    Convulsions, Patient could not walk, childhood allergy   Has patient had a PCN reaction causing immediate rash, facial/tongue/throat swelling, SOB or lightheadedness with hypotension: No Has patient had a PCN reaction causing severe rash involving mucus membranes or skin necrosis: No Has patient had a PCN reaction that required hospitalization No Has patient had a PCN reaction occurring within the last 10 years: No If all of the above answers are "NO", then may proceed with Cephalosporin use.   Follow-up Information    Mindi Curling, PA-C. Schedule an appointment as soon as possible for a visit in 2 week(s).   Specialty:  Physician Assistant Why:  F/U IN 1-2 WEEKS Contact information: [place] [address] [phone]            The results of significant diagnostics from this hospitalization (including imaging, microbiology, ancillary and laboratory) are listed below for reference.    Significant Diagnostic Studies: Dg Chest 2 View  Result Date: 05/03/2018 CLINICAL DATA:  Weakness, chills, generalized body aches, burning sensation LEFT knee, pain and swelling LEFT hand, history CHF, stroke, cirrhosis, GERD, hypertension, substance abuse EXAM: CHEST - 2 VIEW COMPARISON:  12/25/2016 FINDINGS: Enlargement of cardiac silhouette with pulmonary vascular congestion. Mediastinal contours normal. Lungs clear. No infiltrate, pleural effusion, or pneumothorax.  Respiratory motion artifacts degrade lateral view. No acute osseous findings. IMPRESSION: Enlargement of cardiac silhouette with slight vascular congestion. No acute abnormalities. Electronically Signed   By: Lavonia Dana M.D.   On: 05/03/2018 17:02   Dg Knee Complete 4 Views Left  Result Date: 05/03/2018 CLINICAL DATA:  Burning sensation LEFT knee EXAM: LEFT KNEE - COMPLETE 4+ VIEW COMPARISON:  None FINDINGS: Osseous demineralization. Medial compartment joint space narrowing. Small knee joint effusion. No acute fracture, dislocation, or bone destruction. IMPRESSION: Mild degenerative  changes and small joint effusion LEFT knee. No acute abnormalities. Electronically Signed   By: Lavonia Dana M.D.   On: 05/03/2018 17:03   Dg Hand Complete Left  Result Date: 05/03/2018 CLINICAL DATA:  LEFT hand pain and swelling EXAM: LEFT HAND - COMPLETE 3+ VIEW COMPARISON:  None FINDINGS: Osseous demineralization. Joint spaces preserved. Diffuse soft tissue swelling throughout LEFT hand wrist. No fracture, dislocation, or bone destruction. IMPRESSION: Soft tissue swelling and osseous demineralization without acute bony abnormalities. Electronically Signed   By: Lavonia Dana M.D.   On: 05/03/2018 17:03    Microbiology: Recent Results (from the past 240 hour(s))  Urine culture     Status: Abnormal   Collection Time: 05/03/18  4:28 PM  Result Value Ref Range Status   Specimen Description   Final    URINE, RANDOM Performed at [facility] [address] [place], [place] [address]    Special Requests   Final    NONE Performed at [facility], [place] [address], [place], [place] [address]    Culture MULTIPLE SPECIES PRESENT, SUGGEST RECOLLECTION (A)  Final   Report Status 05/05/2018 FINAL  Final  Culture, blood (routine x 2)     Status: None (Preliminary result)   Collection Time: 05/03/18  9:03 PM  Result Value Ref Range Status   Specimen Description   Final    BLOOD LEFT  ANTECUBITAL Performed at [facility] [address], [place], [place] [address]    Special Requests   Final    BOTTLES DRAWN AEROBIC AND ANAEROBIC Blood Culture adequate volume Performed at [facility] [address] [place], [place] [address]    Culture   Final    NO GROWTH 2 DAYS Performed at [facility] [address], [place], [place] [address]    Report Status PENDING  Incomplete  Culture, blood (routine x 2)     Status: None (Preliminary result)   Collection Time: 05/03/18  9:08 PM  Result Value Ref Range Status   Specimen Description   Final    BLOOD LEFT ANTECUBITAL Performed at [facility] [address], [place], [place] [address]    Special Requests   Final    BOTTLES DRAWN AEROBIC AND ANAEROBIC Blood Culture adequate volume Performed at [facility] [address], [place], [place] [address]    Culture   Final    NO GROWTH 2 DAYS Performed at [facility] [address] [place], [place] [address]    Report Status PENDING  Incomplete     Labs: Basic Metabolic Panel: Recent Labs  Lab 05/03/18 1628 05/04/18 0605 05/05/18 0603 05/06/18 0601  NA 136 138 138 139  K 3.2* 4.0 4.4 4.2  CL 103 108 108 109  CO2 21* 19* 22 23  GLUCOSE 103* 155* 136* 103*  BUN 25* 25* 32* 34*  CREATININE 1.66* 1.49* 1.43* 1.33*  CALCIUM 8.7* 8.7* 8.7* 8.6*  MG  --  2.2 2.4 2.6*   Liver Function Tests: Recent Labs  Lab 05/03/18 1628  AST 35  ALT 20  ALKPHOS 62  BILITOT 1.5*  PROT 8.0  ALBUMIN 3.3*   No results for input(s): LIPASE, AMYLASE in the last 168 hours. No results for input(s): AMMONIA in the last 168 hours. CBC: Recent Labs  Lab 05/03/18 1628 05/04/18 0605 05/05/18 0603 05/06/18 0601  WBC 12.1* 8.6 13.9* 10.3  NEUTROABS 9.9* 7.7 12.1* 8.1*  HGB 10.7* 10.7* 10.3* 10.3*  HCT 32.7* 34.0* 31.7* 31.7*  MCV 79.6* 82.1 80.5 82.6  PLT 173 144* 143* 154    Cardiac Enzymes: Recent Labs  Lab 05/03/18 1628  CKTOTAL 222   BNP: BNP (last 3 results) No results for input(s): BNP in the last 8760 hours.  ProBNP (last 3 results) No results for input(s): PROBNP in the last 8760 hours.  CBG: No results for input(s): GLUCAP in the last 168 hours.     Signed:  Irine Seal MD.  [facility] 05/06/2018, 2:34 PM

## 2018-05-06 NOTE — Progress Notes (Signed)
Discharge instructions and medications discussed with patient. AVS and prescriptions given to patient.  Patient stated his ride would be here around 5 pm.  All questions answered.

## 2018-05-06 NOTE — Progress Notes (Addendum)
Physical Therapy Treatment Patient Details Name: John [name] MRN: [id number] DOB: 07-27-1955 Today's Date: 05/06/2018    History of Present Illness 63 yo male admitted with left U/LE pain, inability to ambulate. PMH:HTN, stroke, etoh, CHF,Polysubstance abuse, liver cirrhosis, GIB,CKD.     PT Comments    Pt reports significant improvement with pain. Much improved activity tolerance today, he ambulated 400' with RW with no loss of balance. PT now recommending HHPT rather than SNF due to pt progress. From PT standpoint, he is ready to DC home. PT goals met, will sign off. Pt is safe to ambulate in halls with RW independently.   Follow Up Recommendations  Home health PT     Equipment Recommendations  Rolling walker with 5" wheels;3in1 (PT)    Recommendations for Other Services       Precautions / Restrictions Precautions Precautions: Fall Restrictions Weight Bearing Restrictions: No    Mobility  Bed Mobility               General bed mobility comments: up on edge of bed  Transfers Overall transfer level: Independent Equipment used: None Transfers: Sit to/from Stand Sit to Stand: Independent            Ambulation/Gait Ambulation/Gait assistance: Modified independent (Device/Increase time) Gait Distance (Feet): 400 Feet Assistive device: Rolling walker (2 wheeled) Gait Pattern/deviations: Step-through pattern;Decreased stride length Gait velocity: WFL   General Gait Details: steady with RW, no loss of balance   Stairs             Wheelchair Mobility    Modified Rankin (Stroke Patients Only)       Balance Overall balance assessment: Modified Independent                                          Cognition Arousal/Alertness: Awake/alert Behavior During Therapy: WFL for tasks assessed/performed Overall Cognitive Status: Within Functional Limits for tasks assessed                                         Exercises      General Comments        Pertinent Vitals/Pain Pain Score: 7  Pain Location: L knee at rest and with walking Pain Descriptors / Indicators: Sore Pain Intervention(s): Limited activity within patient's tolerance;Monitored during session    Home Living                      Prior Function            PT Goals (current goals can now be found in the care plan section) Acute Rehab PT Goals Patient Stated Goal: to walk again Progress towards PT goals: Progressing toward goals    Frequency    Min 3X/week      PT Plan Discharge plan needs to be updated    Co-evaluation              AM-PAC PT "6 Clicks" Mobility   Outcome Measure  Help needed turning from your back to your side while in a flat bed without using bedrails?: None Help needed moving from lying on your back to sitting on the side of a flat bed without using bedrails?: None Help needed moving to and from a bed to a chair (including  a wheelchair)?: None Help needed standing up from a chair using your arms (e.g., wheelchair or bedside chair)?: None Help needed to walk in hospital room?: None Help needed climbing 3-5 steps with a railing? : A Little 6 Click Score: 23    End of Session Equipment Utilized During Treatment: Gait belt Activity Tolerance: Patient tolerated treatment well Patient left: in bed;with call bell/phone within reach Nurse Communication: Mobility status PT Visit Diagnosis: Difficulty in walking, not elsewhere classified (R26.2);Pain Pain - Right/Left: Left Pain - part of body: Knee     Time: 6438-3779 PT Time Calculation (min) (ACUTE ONLY): 16 min  Charges:  $Gait Training: 8-22 mins                    [name] PT 05/06/2018  Acute Rehabilitation Services Pager [phone] Office [phone]

## 2018-05-06 NOTE — Clinical Social Work Note (Signed)
Clinical Social Work Assessment  Patient Details  Name: John [name] MRN: [id number] Date of Birth: 1955/06/05  Date of referral:  05/06/18               Reason for consult:  Facility Placement                Permission sought to share information with:  Case Manager Permission granted to share information::  Yes, Verbal Permission Granted  Name::        Agency::     Relationship::     Contact Information:     Housing/Transportation Living arrangements for the past 2 months:  Apartment Source of Information:  Patient Patient Interpreter Needed:  None Criminal Activity/Legal Involvement Pertinent to Current Situation/Hospitalization:  No - Comment as needed Significant Relationships:  Warehouse manager, Siblings, Parents Lives with:  Self Do you feel safe going back to the place where you live?  Yes Need for family participation in patient care:  Yes (Comment)  Care giving concerns:  John [name] is a 63 y.o. male with hx of alcohol abuse, sickle cell trait, CKD stage 2, cirrhosis, HCV (not treated), hx of diastolic CHF who presented with acute hand, knee, and feet swelling x 2 days.  Patient lives alone. PT recomends SNF.    Social Worker assessment / plan:  LCSW consulted for SNF placement.   LCSW met at bedside with patient. No supports present. LCSW discussed PT recommendation for SNF with patient. Patient is aware of complications regarding SNF with Medicaid. Patient states " it is better for me to go home and get help there. "   Patient reports that he [place] 5th floor [place]. He states that he has an aid that comes 7 days a week for 3-4 hours. Patient reports that his elderly mother lives on the 2nd floor of the same building and is willing to help however she can.   At baseline patient reports that he is independent in his ADLs. He stated that he previously had a walker that he no longer uses and he no longer has in his possession.    PLAN: Patient will go home with home health. LCSW notified RNCM of patient wishes. LCSW signing off.     Employment status:  Disabled (Comment on whether or not currently receiving Disability) Insurance information:  Medicaid In [place] PT Recommendations:  [place] / Referral to community resources:     Patient/Family's Response to care:  Patient is thankful for LCSW visit. Patient states " you are very kind and good at your job."   Patient/Family's Understanding of and Emotional Response to Diagnosis, Current Treatment, and Prognosis:  Patient is understanding of his current condition. Patient is knowledgeable of SNF placement and the process of going to SNF with Medicaid. Patient appears hopeful that he can manage at home with home health and his aid.   Emotional Assessment Appearance:  Appears stated age Attitude/Demeanor/Rapport:  Engaged Affect (typically observed):  Hopeful, Calm, Pleasant Orientation:  Oriented to Self, Oriented to Place, Oriented to  Time, Oriented to Situation Alcohol / Substance use:  Not Applicable Psych involvement (Current and /or in the community):  No (Comment)  Discharge Needs  Concerns to be addressed:  No discharge needs identified Readmission within the last 30 days:  No Current discharge risk:  Dependent with Mobility Barriers to Discharge:  Continued Medical Work up   [employer], LCSW 05/06/2018, 9:45 AM

## 2018-05-09 LAB — CULTURE, BLOOD (ROUTINE X 2)
Culture: NO GROWTH
Culture: NO GROWTH
Special Requests: ADEQUATE
Special Requests: ADEQUATE

## 2018-06-25 ENCOUNTER — Ambulatory Visit: Payer: Medicaid Other | Admitting: Podiatry

## 2018-06-25 ENCOUNTER — Other Ambulatory Visit: Payer: Self-pay

## 2018-06-25 DIAGNOSIS — B351 Tinea unguium: Secondary | ICD-10-CM | POA: Diagnosis not present

## 2018-06-25 DIAGNOSIS — M79676 Pain in unspecified toe(s): Secondary | ICD-10-CM | POA: Diagnosis not present

## 2018-06-25 DIAGNOSIS — B353 Tinea pedis: Secondary | ICD-10-CM

## 2018-06-25 MED ORDER — KETOCONAZOLE 2 % EX CREA: TOPICAL_CREAM | CUTANEOUS | 1 refills | Status: DC

## 2018-06-25 NOTE — Patient Instructions (Signed)
Athlete's Foot    Athlete's foot (tinea pedis) is a fungal infection of the skin on your feet. It often occurs on the skin that is between or underneath the toes. It can also occur on the soles of your feet. The infection can spread from person to person (is contagious). It can also spread when a person's bare feet come in contact with the fungus on shower floors or on items such as shoes.  What are the causes?  This condition is caused by a fungus that grows in warm, moist places. You can get athlete's foot by sharing shoes, shower stalls, towels, and wet floors with someone who is infected. Not washing your feet or changing your socks often enough can also lead to athlete's foot.  What increases the risk?  This condition is more likely to develop in:  · Men.  · People who have a weak body defense system (immune system).  · People who have diabetes.  · People who use public showers, such as at a gym.  · People who wear heavy-duty shoes, such as industrial or military shoes.  · Seasons with warm, humid weather.  What are the signs or symptoms?  Symptoms of this condition include:  · Itchy areas between your toes or on the soles of your feet.  · White, flaky, or scaly areas between your toes or on the soles of your feet.  · Very itchy small blisters between your toes or on the soles of your feet.  · Small cuts in your skin. These cuts can become infected.  · Thick or discolored toenails.  How is this diagnosed?  This condition may be diagnosed with a physical exam and a review of your medical history. Your health care provider may also take a skin or toenail sample to examine under a microscope.  How is this treated?  This condition is treated with antifungal medicines. These may be applied as powders, ointments, or creams. In severe cases, an oral antifungal medicine may be given.  Follow these instructions at home:  Medicines  · Apply or take over-the-counter and prescription medicines only as told by your health  care provider.  · Apply your antifungal medicine as told by your health care provider. Do not stop using the antifungal even if your condition improves.  Foot care  · Do not scratch your feet.  · Keep your feet dry:  ? Wear cotton or wool socks. Change your socks every day or if they become wet.  ? Wear shoes that allow air to flow, such as sandals or canvas tennis shoes.  · Wash and dry your feet, including the area between your toes. Also, wash and dry your feet:  ? Every day or as told by your health care provider.  ? After exercising.  General instructions  · Do not let others use towels, shoes, nail clippers, or other personal items that touch your feet.  · Protect your feet by wearing sandals in wet areas, such as locker rooms and shared showers.  · Keep all follow-up visits as told by your health care provider. This is important.  · If you have diabetes, keep your blood sugar under control.  Contact a health care provider if:  · You have a fever.  · You have swelling, soreness, warmth, or redness in your foot.  · Your feet are not getting better with treatment.  · Your symptoms get worse.  · You have new symptoms.  Summary  · Athlete's   foot (tinea pedis) is a fungal infection of the skin on your feet. It often occurs on skin that is between or underneath the toes.  · This condition is caused by a fungus that grows in warm, moist places.  · Symptoms include white, flaky, or scaly areas between your toes or on the soles of your feet.  · This condition is treated with antifungal medicines.  · Keep your feet clean. Always dry them thoroughly.  This information is not intended to replace advice given to you by your health care provider. Make sure you discuss any questions you have with your health care provider.  Document Released: 04/12/2000 Document Revised: 02/03/2017 Document Reviewed: 02/03/2017  Elsevier Interactive Patient Education © 2019 [employer]

## 2018-07-02 ENCOUNTER — Other Ambulatory Visit: Payer: Self-pay | Admitting: Gastroenterology

## 2018-07-04 ENCOUNTER — Encounter: Payer: Self-pay | Admitting: Podiatry

## 2018-07-04 NOTE — Progress Notes (Signed)
Subjective: John [name] presents today with painful, thick toenails 1-5 b/l that he cannot cut and which interfere with daily activities.  Pain is aggravated when wearing enclosed shoe gear.  Mindi Curling, PA-C is his PCP.    Allergies  Allergen Reactions  . Penicillins Other (See Comments)    Convulsions, Patient could not walk, childhood allergy   Has patient had a PCN reaction causing immediate rash, facial/tongue/throat swelling, SOB or lightheadedness with hypotension: No Has patient had a PCN reaction causing severe rash involving mucus membranes or skin necrosis: No Has patient had a PCN reaction that required hospitalization No Has patient had a PCN reaction occurring within the last 10 years: No If all of the above answers are "NO", then may proceed with Cephalosporin use.    Objective:  Vascular Examination: Capillary refill time immediate x 10 digits.  Dorsalis pedis and Posterior tibial pulses palpable b/l.  Digital hair present x 10 digits.  Skin temperature gradient WNL b/l.  Dermatological Examination: Skin with normal turgor, texture and tone b/l.  Toenails 1-5 b/l discolored, thick, dystrophic with subungual debris and pain with palpation to nailbeds due to thickness of nails.  Diffuse scaling noted peripherally and plantarly b/l feet with mild foot odor.  No interdigital macerations.  No blisters, no weeping. No signs of secondary bacterial infection noted.  Musculoskeletal: Muscle strength 5/5 to all LE muscle groups.  Elevatus deformity left haullx.   Hammertoe deformity b/l 2-5.  HAV with bunion b/l.   Neurological: Sensation intact with 10 gram monofilament.  Vibratory sensation intact.  Assessment: 1. Painful onychomycosis toenails 1-5 b/l  2. Tinea pedis b/l  Plan: 1. Toenails 1-5 b/l were debrided in length and girth without iatrogenic bleeding. For tinea pedis, prescription sent to pharmacy for Ketoconazole Cream 2% to be  applied to both feet and between toes qd x 6 weeks. 2. Patient to continue soft, supportive shoe gear 3. Patient to report any pedal injuries to medical professional immediately. 4. Follow up 3 months.  5. Patient/POA to call should there be a concern in the interim.

## 2018-07-28 ENCOUNTER — Other Ambulatory Visit: Payer: Self-pay | Admitting: Podiatry

## 2018-08-24 ENCOUNTER — Encounter (INDEPENDENT_AMBULATORY_CARE_PROVIDER_SITE_OTHER): Payer: Self-pay | Admitting: Ophthalmology

## 2018-08-24 ENCOUNTER — Other Ambulatory Visit: Payer: Self-pay

## 2018-08-24 ENCOUNTER — Ambulatory Visit (INDEPENDENT_AMBULATORY_CARE_PROVIDER_SITE_OTHER): Payer: Medicaid Other | Admitting: Ophthalmology

## 2018-08-24 DIAGNOSIS — H469 Unspecified optic neuritis: Secondary | ICD-10-CM | POA: Diagnosis not present

## 2018-08-24 DIAGNOSIS — I10 Essential (primary) hypertension: Secondary | ICD-10-CM

## 2018-08-24 DIAGNOSIS — H3581 Retinal edema: Secondary | ICD-10-CM

## 2018-08-24 DIAGNOSIS — H4321 Crystalline deposits in vitreous body, right eye: Secondary | ICD-10-CM

## 2018-08-24 DIAGNOSIS — H35033 Hypertensive retinopathy, bilateral: Secondary | ICD-10-CM

## 2018-08-24 DIAGNOSIS — H25813 Combined forms of age-related cataract, bilateral: Secondary | ICD-10-CM

## 2018-08-24 NOTE — Progress Notes (Addendum)
[facility] Note  08/24/2018     CHIEF COMPLAINT Patient presents for Retina Evaluation   HISTORY OF PRESENT ILLNESS: John [name] is a 63 y.o. male who presents to the clinic today for:   HPI    Retina Evaluation    In left eye.  This started days ago.  Duration of days.  Associated Symptoms Flashes and Floaters.  I, the attending physician,  performed the HPI with the patient and updated documentation appropriately.          Comments    New patient possible vein occlusion.  Patient states his vision is stable in both eyes.  In the last year, patient began having floaters and flashes of light in his left eye.  Patient denies pain or discomfort.       Last edited by Bernarda Caffey, MD on 08/24/2018  9:36 AM. (History)    pt states he was sent here by Dr. Katy Fitch for possible vein occlusion, pt states he has seen a neurologist (2016) and has problem with the nerve in his left eye, pt states he went to Dr. Katy Fitch to see if his cataract were ready to be removed, pt states he drank some bad moonshine a few years ago and the neurologist thinks that is what caused the nerve damage, pt states he has stopped drinking completely since then, pt endorses having hypertention  Referring physician: Debbra Riding, MD [address] [place], [place] [address]  HISTORICAL INFORMATION:   Selected notes from the MEDICAL RECORD NUMBER Referred by Dr. 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 (Ophthalmic Drugs)   No current facility-administered medications for this visit.  (Ophthalmic Drugs)   Current Outpatient Medications (Other)  Medication Sig  . allopurinol (ZYLOPRIM) 100 MG tablet Take 1 tablet (100 mg total) by mouth daily.  Marland Kitchen amLODipine (NORVASC) 10 MG tablet  Take 1 tablet (10 mg total) by mouth daily.  . clobetasol (TEMOVATE) 0.05 % external solution Apply 1 application topically 2 (two) times daily as needed (skin irritation).   . colchicine 0.6 MG tablet Take 1 tablet (0.6 mg total) by mouth daily.  . famotidine (PEPCID) 40 MG tablet Take 40 mg by mouth daily.   . folic acid (FOLVITE) 1 MG tablet Take 1 tablet (1 mg total) by mouth daily.  . furosemide (LASIX) 40 MG tablet Take 40 mg by mouth daily.  . hydrALAZINE (APRESOLINE) 50 MG tablet Take 1 tablet (50 mg total) by mouth 3 (three) times daily.  Marland Kitchen HYDROcodone-acetaminophen (NORCO/VICODIN) 5-325 MG tablet Take 1 tablet by mouth every 6 (six) hours as needed for moderate pain or severe pain.   Marland Kitchen ketoconazole (NIZORAL) 2 % cream APPLY TO BOTH FEET AND BETWEEN TOES ONCE DAILY FOR 6 WEEKS.  . lactulose (CHRONULAC) 10 GM/15ML solution Take 10 g by mouth daily as needed for mild constipation or moderate constipation.   . methocarbamol (ROBAXIN) 500 MG tablet Take 500 mg by mouth every 6 (six) hours as needed for muscle spasms.   Marland Kitchen omeprazole (PRILOSEC) 40 MG capsule TAKE ONE CAPSULE BY MOUTH DAILY  . PROAIR HFA 108 (90 Base) MCG/ACT inhaler Inhale 1-2 puffs into the lungs every 6 (six) hours as needed for wheezing or shortness of breath.   . spironolactone (ALDACTONE) 25 MG  tablet TAKE ONE TABLET (25 MG DOSE) BY MOUTH DAILY.   No current facility-administered medications for this visit.  (Other)      REVIEW OF SYSTEMS: ROS    Positive for: Eyes   Negative for: Constitutional, Gastrointestinal, Neurological, Skin, Genitourinary, Musculoskeletal, HENT, Endocrine, Cardiovascular, Respiratory, Psychiatric, Allergic/Imm, Heme/Lymph   Last edited by Doneen Poisson on 08/24/2018  9:11 AM. (History)       ALLERGIES Allergies  Allergen Reactions  . Penicillins Other (See Comments)    Convulsions, Patient could not walk, childhood allergy   Has patient had a PCN reaction causing immediate rash,  facial/tongue/throat swelling, SOB or lightheadedness with hypotension: No Has patient had a PCN reaction causing severe rash involving mucus membranes or skin necrosis: No Has patient had a PCN reaction that required hospitalization No Has patient had a PCN reaction occurring within the last 10 years: No If all of the above answers are "NO", then may proceed with Cephalosporin use.    PAST MEDICAL HISTORY Past Medical History:  Diagnosis Date  . Arthritis   . Ascites   . Cataract   . CHF (congestive heart failure) ([place])   . Chronic back pain   . Chronic kidney disease   . Cirrhosis ([place])   . Dyspnea    walking   . ETOH abuse   . GERD (gastroesophageal reflux disease)   . Hepatitis   . History of blood transfusion   . History of bronchitis   . Hypertension   . Neuromuscular disorder ([place])   . Optic neuropathy, left   . Polysubstance abuse ([place])   . Seizures [facility])    age 18  . Sickle cell trait ([place])   . Stroke [facility]) 1998   Past Surgical History:  Procedure Laterality Date  . ESOPHAGOGASTRODUODENOSCOPY N/A 08/14/2014   Procedure: ESOPHAGOGASTRODUODENOSCOPY (EGD);  Surgeon: Ladene Artist, MD;  Location: [facility];  Service: Endoscopy;  Laterality: N/A;  . ESOPHAGOGASTRODUODENOSCOPY (EGD) WITH PROPOFOL N/A 06/03/2017   Procedure: ESOPHAGOGASTRODUODENOSCOPY (EGD) WITH PROPOFOL;  Surgeon: Ladene Artist, MD;  Location: [facility];  Service: Endoscopy;  Laterality: N/A;  . IR RADIOLOGIST EVAL & MGMT  09/25/2016  . IR RADIOLOGIST EVAL & MGMT  12/10/2016  . IR RADIOLOGIST EVAL & MGMT  03/25/2018  . none    . RADIOFREQUENCY ABLATION Left 11/08/2016   Procedure: LEFT RENAL CRYOABLATION;  Surgeon: Greggory Keen, MD;  Location: [facility];  Service: Anesthesiology;  Laterality: Left;  . RADIOLOGY WITH ANESTHESIA N/A 08/15/2014   Procedure: RADIOLOGY WITH ANESTHESIA;  Surgeon: Greggory Keen, MD;  Location: [facility];  Service: Radiology;  Laterality: N/A;    FAMILY HISTORY Family  History  Problem Relation Age of Onset  . Hypertension Mother        Living  . Kidney disease Mother   . Diabetes Mother   . Heart disease Mother   . Hypertension Father        Deceased, 60  . Ulcers Father   . Stomach cancer Father   . Hypertension Brother   . Diabetes Brother   . Kidney disease Brother   . Hypertension Sister   . Colon cancer Neg Hx   . Esophageal cancer Neg Hx   . Rectal cancer Neg Hx     SOCIAL HISTORY Social History   Tobacco Use  . Smoking status: Former Smoker    Packs/day: 0.30    Years: 20.00    Pack years: 6.00    Types: Cigarettes  . Smokeless tobacco:  Never Used  . Tobacco comment: 2015  Substance Use Topics  . Alcohol use: No    Alcohol/week: 0.0 standard drinks  . Drug use: No    Types: Cocaine    Comment: since fall 2015         OPHTHALMIC EXAM:  Base Eye Exam    Visual Acuity (Snellen - Linear)      Right Left   Dist cc 20/40 -3 20/150 -2   Dist ph cc 20/30 -2 20/80 -1       Tonometry (Tonopen, 9:28 AM)      Right Left   Pressure 14 14       Pupils      Dark Light Shape React APD   Right 3 2 Round Brisk 0   Left 3 2 Round Brisk 0       Visual Fields      Left Right     Full   Restrictions Total superior nasal deficiency        Extraocular Movement      Right Left    Full Full       Neuro/Psych    Oriented x3:  Yes   Mood/Affect:  Normal       Dilation    Both eyes:  1.0% Mydriacyl, 2.5% Phenylephrine @ 9:28 AM        Slit Lamp and Fundus Exam    Slit Lamp Exam      Right Left   Lids/Lashes Dermatochalasis - upper lid, mild Meibomian gland dysfunction Dermatochalasis - upper lid, mild Meibomian gland dysfunction   Conjunctiva/Sclera temporal Pinguecula, nasal pterygium temporal Pinguecula, nasal pterygium   Cornea nasal pterygium, mild Arcus, 1+ Punctate epithelial erosions mild Arcus, 2+ Punctate epithelial erosions, EBMD   Anterior Chamber moderate depth moderate depth   Iris Round and dilated  Round and dilated   Lens 1-2+ Nuclear sclerosis, 1-2+ Cortical cataract 2+ Nuclear sclerosis, 1-2+ Cortical cataract   Vitreous mild Vitreous syneresis, vitreous condensations v. Mild asteroid inferiorly mild Vitreous syneresis       Fundus Exam      Right Left   Disc mild Pallor, Sharp rim 1-2+ Pallor, Sharp rim   C/D Ratio 0.5 0.55 with superior rim thinning   Macula Flat, Good foveal reflex, mild Retinal pigment epithelial mottling Flat, Good foveal reflex, mild Retinal pigment epithelial mottling   Vessels Vascular attenuation, Tortuous Vascular attenuation, Tortuous   Periphery Attached    Attached           Refraction    Wearing Rx      Sphere Cylinder Axis Add   Right -1.50 +0.50 010 +2.25   Left -1.25 +0.75 042 +2.25   Type:  Bifocal       Manifest Refraction      Sphere Cylinder Axis Dist VA   Right -1.00 +0.50 010 20/30-2   Left -2.25 +0.75 045 20/100-1          IMAGING AND PROCEDURES  Imaging and Procedures for @TODAY @  OCT, Retina - OU - Both Eyes       Right Eye Quality was good. Central Foveal Thickness: 242. Progression has no prior data. Findings include normal foveal contour, no SRF, no IRF.   Left Eye Quality was borderline. Central Foveal Thickness: 250. Progression has no prior data.   Notes *Images captured and stored on drive  Diagnosis / Impression:  NFP; no IRF/SRF  Clinical management:  See below  Abbreviations: NFP - Normal foveal profile.  CME - cystoid macular edema. PED - pigment epithelial detachment. IRF - intraretinal fluid. SRF - subretinal fluid. EZ - ellipsoid zone. ERM - epiretinal membrane. ORA - outer retinal atrophy. ORT - outer retinal tubulation. SRHM - subretinal hyper-reflective material                 ASSESSMENT/PLAN:    ICD-10-CM   1. Optic neuropathy H46.9   2. 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 Essential hypertension I10   5. Hypertensive  retinopathy of both eyes H35.033   6. Combined forms of age-related cataract of both eyes H25.813     1. Optic Neuropathy OS - review of Epic chart shows history of optic neuropathy OS dating back to 05/2014 - pt had MRI orbits on 06/26/2014 (see report below) and was evaluated by Neurology - MRI showed L optic nerve edema without enhancement, suggesting toxic optic neuropathy, less likely optic neuritis or demyelination - Neurologist suspected possible toxicity from bad moonshine - by history, pt reports vision has been stably decreased OS since 2016 with perhaps some mild progressive worsening - on dilated exam today, optic disc pallor OU (OS>OD) and no other significant retinal or ocular findings to explain vision loss - he does have mild cataracts OU, which may have progressed to account for some interval visual decline - f/u here prn  2. No retinal edema on exam or OCT  3. Asteroid Hyalosis OD - mild - monitor  4,5. Hypertensive retinopathy OU  - discussed importance of tight BP control  - monitor  6. Mixed form age related cataract  - under the expert management of Dr. Wyatt Portela  - The symptoms of cataract, surgical options, and treatments and risks were discussed with patient.  - discussed diagnosis and progression  - clear from a retina standpoint to proceed with cataract surgery when pt and surgeon are ready  - discussed visual improvement with cataract surgery may be limited by underlying optic neuropathy   Ophthalmic Meds Ordered this visit:  No orders of the defined types were placed in this encounter.      Return if symptoms worsen or fail to improve.  There are no Patient Instructions on file for this visit.   Explained the diagnoses, plan, and follow up with the patient and they expressed understanding.  Patient expressed understanding of the importance of proper follow up care.   This document serves as a record of services personally performed by Gardiner Sleeper, MD, PhD. It was created on their behalf by Ernest Mallick, OA, an ophthalmic assistant. The creation of this record is the provider's dictation and/or activities during the visit.    Electronically signed by: Ernest Mallick, OA  04.27.2020 12:12 PM    Gardiner Sleeper, M.D., Ph.D. Diseases & Surgery of the Retina and Vitreous [facility]  I have reviewed the above documentation for accuracy and completeness, and I agree with the above. Gardiner Sleeper, M.D., Ph.D. 08/24/18 12:16 PM      Abbreviations: M myopia (nearsighted); A astigmatism; H hyperopia (farsighted); P presbyopia; Mrx spectacle prescription;  CTL contact lenses; OD right eye; OS left eye; OU both eyes  XT exotropia; ET esotropia; PEK punctate epithelial keratitis; PEE punctate epithelial erosions; DES dry eye syndrome; MGD meibomian gland dysfunction; ATs artificial tears; PFAT's preservative free artificial tears; [place] nuclear sclerotic cataract; PSC posterior subcapsular cataract; ERM epi-retinal membrane;  PVD posterior vitreous detachment; RD retinal detachment; DM diabetes mellitus; DR diabetic retinopathy; NPDR non-proliferative diabetic retinopathy; PDR proliferative diabetic retinopathy; CSME clinically significant macular edema; DME diabetic macular edema; dbh dot blot hemorrhages; CWS cotton wool spot; POAG primary open angle glaucoma; C/D cup-to-disc ratio; HVF humphrey visual field; GVF goldmann visual field; OCT optical coherence tomography; IOP intraocular pressure; BRVO Branch retinal vein occlusion; CRVO central retinal vein occlusion; CRAO central retinal artery occlusion; BRAO branch retinal artery occlusion; RT retinal tear; SB scleral buckle; PPV pars plana vitrectomy; VH Vitreous hemorrhage; PRP panretinal laser photocoagulation; IVK intravitreal kenalog; VMT vitreomacular traction; MH Macular hole;  NVD neovascularization of the disc; NVE neovascularization elsewhere; AREDS age related eye  disease study; ARMD age related macular degeneration; POAG primary open angle glaucoma; EBMD epithelial/anterior basement membrane dystrophy; ACIOL anterior chamber intraocular lens; IOL intraocular lens; PCIOL posterior chamber intraocular lens; Phaco/IOL phacoemulsification with intraocular lens placement; [place] photorefractive keratectomy; LASIK laser assisted in situ keratomileusis; HTN hypertension; DM diabetes mellitus; COPD chronic obstructive pulmonary disease    MR ORBITS W WO CONTRAST 2.28.2016  CLINICAL DATA:  Sudden LEFT vision loss 6 days ago. Recent MRI demonstrating possible LEFT optic neuritis and bilateral abnormal thalamic signal. History of hypertension and polysubstance abuse.  EXAM: MRI OF THE ORBITS WITHOUT AND WITH CONTRAST  TECHNIQUE: Multiplanar, multisequence MR imaging of the orbits was performed both before and after the administration of intravenous contrast.  CONTRAST:  68mL MULTIHANCE GADOBENATE DIMEGLUMINE 529 MG/ML IV SOLN  COMPARISON:  MRI of the brain June 24, 2014  FINDINGS: Expansile T2 bright signal in the LEFT optic nerve, better seen on today's non motion degraded examination (coronal T2 17/30, axial T2 11/23). No abnormal enhancement within the orbits with particular attention to the optic nerve. Ocular globes intact and unremarkable, normal appearance of the orbital fat. Normal appearance of the extra-ocular muscles.  Relatively unchanged LEFT greater than RIGHT abnormal alignment T2 hyperintense signal without superimposed enhancement. Dolicoectatic intracranial vessels can be seen with chronic hypertension. Mild cerebellar volume loss for age.  Chronic LEFT maxillary sinusitis with mild ethmoid mucosal thickening. The visualized mastoid air cells are well aerated.  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 This is unlikely to reflect demyelination.  Redemonstration of bithalamic abnormal signal; Differential diagnosis includes hypertensive encephalopathy, metabolic disturbance or, less likely Wernicke's encephalopathy.

## 2018-10-17 ENCOUNTER — Encounter (HOSPITAL_COMMUNITY): Payer: Self-pay

## 2018-10-17 ENCOUNTER — Ambulatory Visit (HOSPITAL_COMMUNITY)
Admission: EM | Admit: 2018-10-17 | Discharge: 2018-10-17 | Disposition: A | Discharge status: Home / Self Care | Payer: Medicaid Other | Attending: Family Medicine | Admitting: Family Medicine

## 2018-10-17 ENCOUNTER — Other Ambulatory Visit: Payer: Self-pay

## 2018-10-17 DIAGNOSIS — M545 Low back pain, unspecified: Secondary | ICD-10-CM

## 2018-10-17 DIAGNOSIS — G8929 Other chronic pain: Secondary | ICD-10-CM

## 2018-10-17 MED ORDER — CYCLOBENZAPRINE HCL 10 MG PO TABS: 10.0000 mg | ORAL_TABLET | Freq: Two times a day (BID) | ORAL | 0 refills | Status: DC | PRN
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
## 2018-10-17 NOTE — Discharge Instructions (Signed)
Continue tylenol as needed. Take flexeril as need up to 3 times per day for back pain.
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
## 2018-10-27 ENCOUNTER — Encounter: Payer: Self-pay | Admitting: Podiatry

## 2018-10-27 ENCOUNTER — Other Ambulatory Visit: Payer: Self-pay

## 2018-10-27 ENCOUNTER — Ambulatory Visit: Payer: Medicaid Other | Admitting: Podiatry

## 2018-10-27 VITALS — Temp 98.9°F

## 2018-10-27 DIAGNOSIS — M79676 Pain in unspecified toe(s): Secondary | ICD-10-CM | POA: Diagnosis not present

## 2018-10-27 DIAGNOSIS — B351 Tinea unguium: Secondary | ICD-10-CM | POA: Diagnosis not present

## 2018-10-27 NOTE — Patient Instructions (Signed)
Diabetes Mellitus and Foot Care Foot care is an important part of your health, especially when you have diabetes. Diabetes may cause you to have problems because of poor blood flow (circulation) to your feet and legs, which can cause your skin to:  Become thinner and drier.  Break more easily.  Heal more slowly.  Peel and crack. You may also have nerve damage (neuropathy) in your legs and feet, causing decreased feeling in them. This means that you may not notice minor injuries to your feet that could lead to more serious problems. Noticing and addressing any potential problems early is the best way to prevent future foot problems. How to care for your feet Foot hygiene  Wash your feet daily with warm water and mild soap. Do not use hot water. Then, pat your feet and the areas between your toes until they are completely dry. Do not soak your feet as this can dry your skin.  Trim your toenails straight across. Do not dig under them or around the cuticle. File the edges of your nails with an emery board or nail file.  Apply a moisturizing lotion or petroleum jelly to the skin on your feet and to dry, brittle toenails. Use lotion that does not contain alcohol and is unscented. Do not apply lotion between your toes. Shoes and socks  Wear clean socks or stockings every day. Make sure they are not too tight. Do not wear knee-high stockings since they may decrease blood flow to your legs.  Wear shoes that fit properly and have enough cushioning. Always look in your shoes before you put them on to be sure there are no objects inside.  To break in new shoes, wear them for just a few hours a day. This prevents injuries on your feet. Wounds, scrapes, corns, and calluses  Check your feet daily for blisters, cuts, bruises, sores, and redness. If you cannot see the bottom of your feet, use a mirror or ask someone for help.  Do not cut corns or calluses or try to remove them with medicine.  If you  find a minor scrape, cut, or break in the skin on your feet, keep it and the skin around it clean and dry. You may clean these areas with mild soap and water. Do not clean the area with peroxide, alcohol, or iodine.  If you have a wound, scrape, corn, or callus on your foot, look at it several times a day to make sure it is healing and not infected. Check for: ? Redness, swelling, or pain. ? Fluid or blood. ? Warmth. ? Pus or a bad smell. General instructions  Do not cross your legs. This may decrease blood flow to your feet.  Do not use heating pads or hot water bottles on your feet. They may burn your skin. If you have lost feeling in your feet or legs, you may not know this is happening until it is too late.  Protect your feet from hot and cold by wearing shoes, such as at the beach or on hot pavement.  Schedule a complete foot exam at least once a year (annually) or more often if you have foot problems. If you have foot problems, report any cuts, sores, or bruises to your health care provider immediately. Contact a health care provider if:  You have a medical condition that increases your risk of infection and you have any cuts, sores, or bruises on your feet.  You have an injury that is not   healing.  You have redness on your legs or feet.  You feel burning or tingling in your legs or feet.  You have pain or cramps in your legs and feet.  Your legs or feet are numb.  Your feet always feel cold.  You have pain around a toenail. Get help right away if:  You have a wound, scrape, corn, or callus on your foot and: ? You have pain, swelling, or redness that gets worse. ? You have fluid or blood coming from the wound, scrape, corn, or callus. ? Your wound, scrape, corn, or callus feels warm to the touch. ? You have pus or a bad smell coming from the wound, scrape, corn, or callus. ? You have a fever. ? You have a red line going up your leg. Summary  Check your feet every day  for cuts, sores, red spots, swelling, and blisters.  Moisturize feet and legs daily.  Wear shoes that fit properly and have enough cushioning.  If you have foot problems, report any cuts, sores, or bruises to your health care provider immediately.  Schedule a complete foot exam at least once a year (annually) or more often if you have foot problems. This information is not intended to replace advice given to you by your health care provider. Make sure you discuss any questions you have with your health care provider. Document Released: 04/12/2000 Document Revised: 05/28/2017 Document Reviewed: 05/17/2016 Elsevier Patient Education  2020 Elsevier Inc.  

## 2018-10-31 NOTE — Progress Notes (Signed)
Subjective:  John [name] presents to clinic today with cc of  painful, thick, discolored, elongated toenails 1-5 b/l that become tender and cannot cut because of thickness. Pain is aggravated when wearing enclosed shoe gear.   Current Outpatient Medications:  .  allopurinol (ZYLOPRIM) 100 MG tablet, Take 1 tablet (100 mg total) by mouth daily., Disp: 30 tablet, Rfl: 0 .  amLODipine (NORVASC) 10 MG tablet, Take 1 tablet (10 mg total) by mouth daily., Disp: 30 tablet, Rfl: 0 .  BD VEO INSULIN SYR U/F 1/2UNIT 31G X 15/64" 0.3 ML MISC, See admin instructions., Disp: , Rfl:  .  clobetasol (TEMOVATE) 0.05 % external solution, Apply 1 application topically 2 (two) times daily as needed (skin irritation). , Disp: , Rfl: 0 .  colchicine 0.6 MG tablet, Take 1 tablet (0.6 mg total) by mouth daily., Disp: 30 tablet, Rfl: 0 .  cyclobenzaprine (FLEXERIL) 10 MG tablet, Take 1 tablet (10 mg total) by mouth 2 (two) times daily as needed for muscle spasms., Disp: 20 tablet, Rfl: 0 .  ENULOSE 10 GM/15ML SOLN, TAKE 30ML BY MOUTH TWICE DAILY, Disp: , Rfl:  .  famotidine (PEPCID) 40 MG tablet, Take 40 mg by mouth daily. , Disp: , Rfl: 1 .  folic acid (FOLVITE) 1 MG tablet, Take 1 tablet (1 mg total) by mouth daily., Disp: , Rfl:  .  furosemide (LASIX) 40 MG tablet, Take 40 mg by mouth daily., Disp: , Rfl:  .  hydrALAZINE (APRESOLINE) 10 MG tablet, Take 10 mg by mouth 3 (three) times daily., Disp: , Rfl:  .  hydrALAZINE (APRESOLINE) 50 MG tablet, Take 1 tablet (50 mg total) by mouth 3 (three) times daily., Disp: 90 tablet, Rfl: 0 .  HYDROcodone-acetaminophen (NORCO/VICODIN) 5-325 MG tablet, Take 1 tablet by mouth every 6 (six) hours as needed for moderate pain or severe pain. , Disp: , Rfl: 0 .  ketoconazole (NIZORAL) 2 % cream, APPLY TO BOTH FEET AND BETWEEN TOES ONCE DAILY FOR 6 WEEKS., Disp: 30 g, Rfl: 1 .  lactulose (CHRONULAC) 10 GM/15ML solution, Take 10 g by mouth daily as needed for mild constipation or  moderate constipation. , Disp: , Rfl: 3 .  methocarbamol (ROBAXIN) 500 MG tablet, Take 500 mg by mouth every 6 (six) hours as needed for muscle spasms. , Disp: , Rfl: 1 .  methylPREDNISolone (MEDROL DOSEPAK) 4 MG TBPK tablet, See admin instructions. follow package directions, Disp: , Rfl:  .  omeprazole (PRILOSEC) 40 MG capsule, TAKE ONE CAPSULE BY MOUTH DAILY, Disp: 30 capsule, Rfl: 5 .  PROAIR HFA 108 (90 Base) MCG/ACT inhaler, Inhale 1-2 puffs into the lungs every 6 (six) hours as needed for wheezing or shortness of breath. , Disp: , Rfl: 1 .  spironolactone (ALDACTONE) 25 MG tablet, TAKE ONE TABLET (25 MG DOSE) BY MOUTH DAILY., Disp: , Rfl:    Allergies  Allergen Reactions  . Penicillins Other (See Comments)    Convulsions, Patient could not walk, childhood allergy   Has patient had a PCN reaction causing immediate rash, facial/tongue/throat swelling, SOB or lightheadedness with hypotension: No Has patient had a PCN reaction causing severe rash involving mucus membranes or skin necrosis: No Has patient had a PCN reaction that required hospitalization No Has patient had a PCN reaction occurring within the last 10 years: No If all of the above answers are "NO", then may proceed with Cephalosporin use.     Objective: Vitals:   10/27/18 1445  Temp: 98.9 F (37.2 C)  Physical Examination:  Vascular Examination: Capillary refill time immediate x 10 digits.  Palpable DP/PT pulses b/l.  Digital hair present b/l.  No edema noted b/l.  Skin temperature gradient WNL b/l.  Dermatological Examination: Skin with normal turgor, texture and tone b/l.  No open wounds b/l.  No interdigital macerations noted b/l.  Elongated, thick, discolored brittle toenails with subungual debris and pain on dorsal palpation of nailbeds 1-5 b/l.  Musculoskeletal Examination: Muscle strength 5/5 to all muscle groups b/l.  Elevatus deformity left hallux.  Hammertoes 2-5 b/l.  HAV with bunion  b/l.  No pain, crepitus or joint discomfort with active/passive ROM.  Neurological Examination: Sensation intact 5/5 b/l with 10 gram monofilament.  Vibratory sensation intact b/l.  Assessment: Mycotic nail infection with pain 1-5 b/l  Plan: 1. Toenails 1-5 b/l were debrided in length and girth without iatrogenic laceration. 2.  Continue soft, supportive shoe gear daily. 3.  Report any pedal injuries to medical professional. 4.  Follow up 3 months. 5.  Patient/POA to call should there be a question/concern in there interim.

## 2018-11-17 IMAGING — DX DG CHEST 1V
1 series · 1 of 1 positions shown · non-contrast
Comparison: 07/18/2015

CLINICAL DATA: Preop left renal cryoablation.

EXAM:
CHEST 1 VIEW

[chest pa]
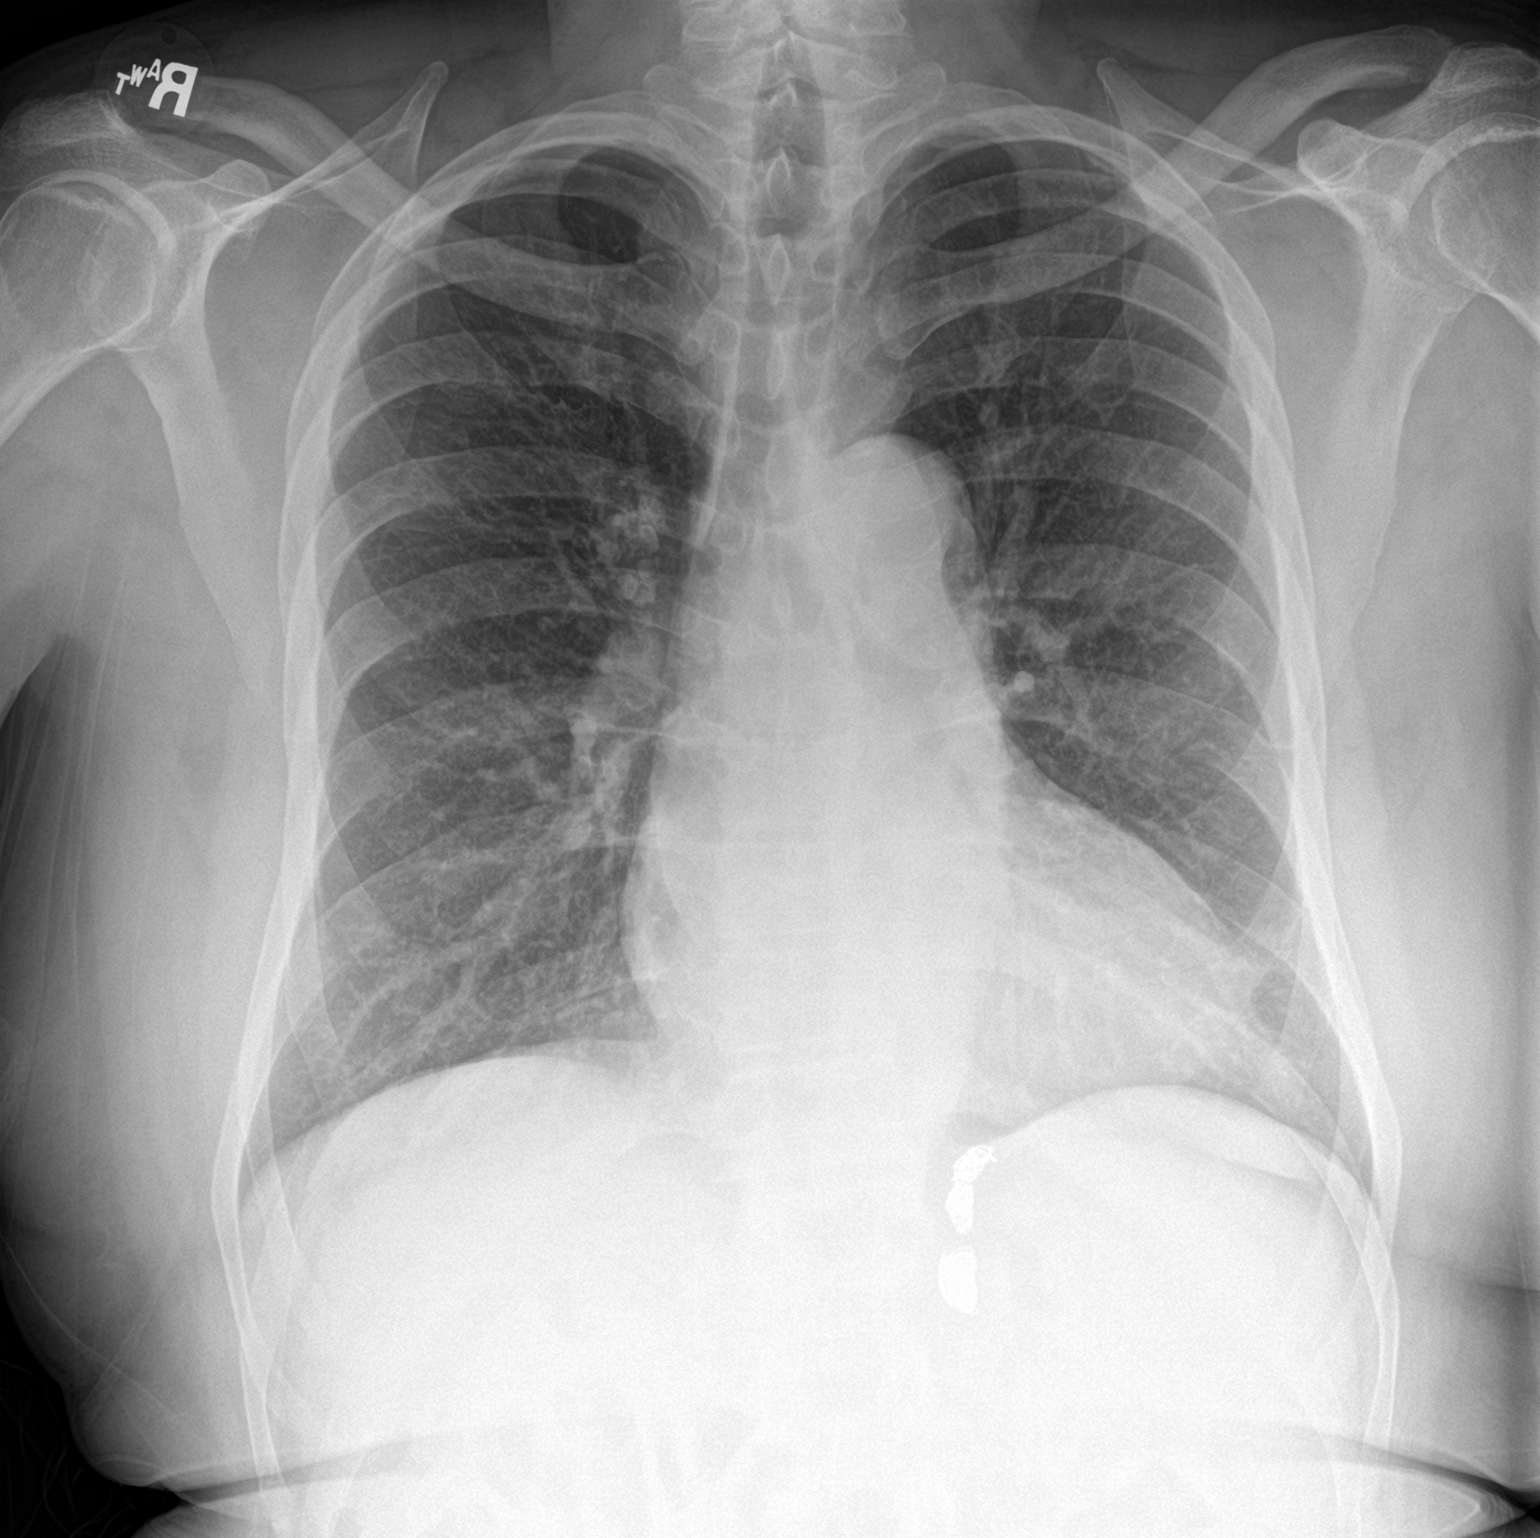

[1 of 1 positions shown; findings below may reference images not displayed]

FINDINGS: Heart is borderline in size. Lungs are clear. No effusions or acute
bony abnormality.
IMPRESSION: No active disease.

## 2018-11-20 ENCOUNTER — Ambulatory Visit: Payer: Medicaid Other | Admitting: Podiatry

## 2018-12-24 ENCOUNTER — Ambulatory Visit: Payer: Medicaid Other | Admitting: Podiatry

## 2019-01-18 ENCOUNTER — Ambulatory Visit: Payer: Medicaid Other | Admitting: Podiatry

## 2019-02-01 ENCOUNTER — Ambulatory Visit: Payer: Medicaid Other | Admitting: Podiatry

## 2019-03-03 ENCOUNTER — Other Ambulatory Visit: Payer: Self-pay | Admitting: Interventional Radiology

## 2019-03-03 DIAGNOSIS — C642 Malignant neoplasm of left kidney, except renal pelvis: Secondary | ICD-10-CM

## 2019-03-19 ENCOUNTER — Ambulatory Visit (HOSPITAL_COMMUNITY)
Admission: RE | Admit: 2019-03-19 | Discharge: 2019-03-19 | Disposition: A | Discharge status: Home / Self Care | Payer: Medicaid Other | Source: Ambulatory Visit | Attending: Interventional Radiology | Admitting: Interventional Radiology

## 2019-03-19 ENCOUNTER — Other Ambulatory Visit: Payer: Self-pay

## 2019-03-19 DIAGNOSIS — C642 Malignant neoplasm of left kidney, except renal pelvis: Secondary | ICD-10-CM | POA: Insufficient documentation

## 2019-03-19 LAB — POCT I-STAT CREATININE: Creatinine, Ser: 1.5 mg/dL — ABNORMAL HIGH (ref 0.61–1.24)

## 2019-03-19 MED ORDER — GADOBUTROL 1 MMOL/ML IV SOLN
10.0000 mL | Freq: Once | INTRAVENOUS | Status: AC | PRN
  Administered 2019-03-19: 10 mL via INTRAVENOUS

## 2019-03-24 ENCOUNTER — Encounter: Payer: Self-pay | Admitting: *Deleted

## 2019-03-24 ENCOUNTER — Ambulatory Visit
Admission: RE | Admit: 2019-03-24 | Discharge: 2019-03-24 | Disposition: A | Discharge status: Home / Self Care | Payer: Medicaid Other | Source: Ambulatory Visit | Attending: Interventional Radiology | Admitting: Interventional Radiology

## 2019-03-24 ENCOUNTER — Other Ambulatory Visit: Payer: Self-pay

## 2019-03-24 DIAGNOSIS — C642 Malignant neoplasm of left kidney, except renal pelvis: Secondary | ICD-10-CM

## 2019-03-24 HISTORY — PX: IR RADIOLOGIST EVAL & MGMT: IMG5224

## 2019-03-24 NOTE — Progress Notes (Signed)
Patient ID: John [name], male   DOB: [birth year], 63 y.o.   MRN: [id number]       Chief Complaint:  Follow-up left renal cryoablation  Referring Physician(s): Nahlia Hellmann,[place]  History of Present Illness: John [name] is a 63 y.o. male with a prior history of chronic renal insufficiency, hepatitis C, cirrhosis and portal hypertension.  Incidental solid enhancing left renal mass was demonstrated by CT and further evaluated with MRI concerning for renal cell carcinoma.  He underwent successful CT-guided cryoablation at [facility] on November 08, 2016.  In the past he is also had a prior B RTO for bleeding gastric varices.  No new issues over the last year.  Stable weight and appetite.  No abdominal pain, flank pain, dysuria or hematuria.  No recent illness or fever.  Today's visit is to review annual surveillance imaging.  Past Medical History:  Diagnosis Date   Arthritis    Ascites    Cataract    CHF (congestive heart failure) (HCC)    Chronic back pain    Chronic kidney disease    Cirrhosis (HCC)    Dyspnea    walking    ETOH abuse    GERD (gastroesophageal reflux disease)    Hepatitis    History of blood transfusion    History of bronchitis    Hypertension    Neuromuscular disorder (HCC)    Optic neuropathy, left    Polysubstance abuse ([place])    Seizures ([place])    age 72   Sickle cell trait ([place])    Stroke ([place]) 1998    Past Surgical History:  Procedure Laterality Date   ESOPHAGOGASTRODUODENOSCOPY N/A 08/14/2014   Procedure: ESOPHAGOGASTRODUODENOSCOPY (EGD);  Surgeon: Ladene Artist, MD;  Location: [facility];  Service: Endoscopy;  Laterality: N/A;   ESOPHAGOGASTRODUODENOSCOPY (EGD) WITH PROPOFOL N/A 06/03/2017   Procedure: ESOPHAGOGASTRODUODENOSCOPY (EGD) WITH PROPOFOL;  Surgeon: Ladene Artist, MD;  Location: [facility];  Service: Endoscopy;  Laterality: N/A;   IR RADIOLOGIST EVAL & MGMT  09/25/2016   IR RADIOLOGIST EVAL & MGMT   12/10/2016   IR RADIOLOGIST EVAL & MGMT  03/25/2018   none     RADIOFREQUENCY ABLATION Left 11/08/2016   Procedure: LEFT RENAL CRYOABLATION;  Surgeon: Greggory Keen, MD;  Location: [facility];  Service: Anesthesiology;  Laterality: Left;   RADIOLOGY WITH ANESTHESIA N/A 08/15/2014   Procedure: RADIOLOGY WITH ANESTHESIA;  Surgeon: Greggory Keen, MD;  Location: [facility];  Service: Radiology;  Laterality: N/A;    Allergies: Penicillins  Medications: Prior to Admission medications   Medication Sig Start Date End Date Taking? Authorizing Provider  allopurinol (ZYLOPRIM) 100 MG tablet Take 1 tablet (100 mg total) by mouth daily. 05/12/18   Eugenie Filler, MD  amLODipine (NORVASC) 10 MG tablet Take 1 tablet (10 mg total) by mouth daily. 05/25/15   Debbe Odea, MD  BD VEO INSULIN SYR U/F 1/2UNIT 31G X 15/64" 0.3 ML MISC See admin instructions. 09/22/18   [provider]  clobetasol (TEMOVATE) 0.05 % external solution Apply 1 application topically 2 (two) times daily as needed (skin irritation).  10/01/17   [provider]  colchicine 0.6 MG tablet Take 1 tablet (0.6 mg total) by mouth daily. 05/07/18   Eugenie Filler, MD  cyclobenzaprine (FLEXERIL) 10 MG tablet Take 1 tablet (10 mg total) by mouth 2 (two) times daily as needed for muscle spasms. 10/17/18   Scot Jun, FNP  ENULOSE 10 GM/15ML SOLN TAKE 30ML BY MOUTH TWICE DAILY  07/31/18   [provider]  famotidine (PEPCID) 40 MG tablet Take 40 mg by mouth daily.  07/25/17   [provider]  folic acid (FOLVITE) 1 MG tablet Take 1 tablet (1 mg total) by mouth daily. 05/07/18   Eugenie Filler, MD  furosemide (LASIX) 40 MG tablet Take 40 mg by mouth daily.    [provider]  hydrALAZINE (APRESOLINE) 10 MG tablet Take 10 mg by mouth 3 (three) times daily. 07/28/18   [provider]  hydrALAZINE (APRESOLINE) 50 MG tablet Take 1 tablet (50 mg total) by mouth 3 (three) times daily. 05/25/15    Debbe Odea, MD  HYDROcodone-acetaminophen (NORCO/VICODIN) 5-325 MG tablet Take 1 tablet by mouth every 6 (six) hours as needed for moderate pain or severe pain.  02/20/18   [provider]  ketoconazole (NIZORAL) 2 % cream APPLY TO BOTH FEET AND BETWEEN TOES ONCE DAILY FOR 6 WEEKS. 07/29/18   Marzetta Board, DPM  lactulose (CHRONULAC) 10 GM/15ML solution Take 10 g by mouth daily as needed for mild constipation or moderate constipation.  11/01/16   [provider]  methocarbamol (ROBAXIN) 500 MG tablet Take 500 mg by mouth every 6 (six) hours as needed for muscle spasms.  01/29/18   [provider]  methylPREDNISolone (MEDROL DOSEPAK) 4 MG TBPK tablet See admin instructions. follow package directions 10/19/18   [provider]  omeprazole (PRILOSEC) 40 MG capsule TAKE ONE CAPSULE BY MOUTH DAILY 07/02/18   Ladene Artist, MD  PROAIR HFA 108 [phone] Base) MCG/ACT inhaler Inhale 1-2 puffs into the lungs every 6 (six) hours as needed for wheezing or shortness of breath.  10/02/17   [provider]  spironolactone (ALDACTONE) 25 MG tablet TAKE ONE TABLET (25 MG DOSE) BY MOUTH DAILY. 06/13/18   [provider]     Family History  Problem Relation Age of Onset   Hypertension Mother        Living   Kidney disease Mother    Diabetes Mother    Heart disease Mother    Hypertension Father        Deceased, 73   Ulcers Father    Stomach cancer Father    Hypertension Brother    Diabetes Brother    Kidney disease Brother    Hypertension Sister    Colon cancer Neg Hx    Esophageal cancer Neg Hx    Rectal cancer Neg Hx     Social History   Socioeconomic History   Marital status: Single    Spouse name: Not on file   Number of children: 0   Years of education: Not on file   Highest education level: Not on file  Occupational History   Occupation: retired  Scientist, product/process development strain: Not on file   Food insecurity      Worry: Not on file    Inability: Not on Lexicographer needs    Medical: Not on file    Non-medical: Not on file  Tobacco Use   Smoking status: Former Smoker    Packs/day: 0.30    Years: 20.00    Pack years: 6.00    Types: Cigarettes   Smokeless tobacco: Never Used   Tobacco comment: 2015  Substance and Sexual Activity   Alcohol use: No    Alcohol/week: 0.0 standard drinks   Drug use: No    Types: Cocaine    Comment: since fall 2015   Sexual activity:  Not on file  Lifestyle   Physical activity    Days per week: Not on file    Minutes per session: Not on file   Stress: Not on file  Relationships   Social connections    Talks on phone: Not on file    Gets together: Not on file    Attends religious service: Not on file    Active member of club or organization: Not on file    Attends meetings of clubs or organizations: Not on file    Relationship status: Not on file  Other Topics Concern   Not on file  Social History Narrative   Lives with niece in a 2 story home.     On disability since 2015 for low back pain.  Used to work Statistician.    Highest level of education: 11th grade    Review of Systems  Review of Systems: A 12 point ROS discussed and pertinent positives are indicated in the HPI above.  All other systems are negative.  Physical Exam No direct physical exam was performed, telehealth visit only today Vital Signs: There were no vitals taken for this visit.  Imaging: Mr Abdomen Wwo Contrast  Result Date: 03/19/2019 CLINICAL DATA:  Follow-up left renal cell carcinoma. Status post percutaneous cryoablation. EXAM: MRI ABDOMEN WITHOUT AND WITH CONTRAST TECHNIQUE: Multiplanar multisequence MR imaging of the abdomen was performed both before and after the administration of intravenous contrast. CONTRAST:  12mL GADAVIST GADOBUTROL 1 MMOL/ML IV SOLN COMPARISON:  03/25/2018 FINDINGS: Lower chest: No acute findings. Hepatobiliary: Hepatic  cirrhosis again demonstrated. No liver masses identified. Recanalization of paraumbilical veins and esophageal varices are consistent with portal venous hypertension. Gallbladder is unremarkable. No evidence of biliary ductal dilatation. Pancreas:  No mass or inflammatory changes. Spleen: Stable mild splenomegaly, consistent with portal venous hypertension. Adrenals/Urinary Tract: Normal adrenal glands. No evidence of right renal mass or hydronephrosis. Cryoablation defect is seen in the upper pole of the left kidney. A subcapsular lesion is seen in the lateral upper pole of the left kidney at the ablation site, which measures 1.4 x 1.1 cm and shows T2 hyperintensity as well as heterogeneous contrast enhancement. This has not significantly changed in size since previous study, but is suspicious for locally recurrent carcinoma. A 9 mm lesion in the anterior upper pole of the left kidney is consistent with a benign hemorrhagic Bosniak category 2 cyst and is stable since prior study. No evidence of hydronephrosis. Stomach/Bowel: Visualized portion unremarkable. Vascular/Lymphatic: No pathologically enlarged lymph nodes identified. No abdominal aortic aneurysm. Other:  None. Musculoskeletal:  No suspicious bone lesions identified. IMPRESSION: 1. 1.4 cm subcapsular lesion in upper pole of left kidney at cryoablation site remains stable in size, but does not have typical findings of a cryoablation defect. Ill-defined margins and contrast enhancement raises suspicion for locally recurrent carcinoma. 2. No evidence of abdominal metastatic disease. 3. Hepatic cirrhosis and findings of portal venous hypertension. No evidence of hepatic neoplasm. Electronically Signed   By: Marlaine Hind M.D.   On: 03/19/2019 14:57    Labs:  CBC: Recent Labs    05/03/18 1628 05/04/18 0605 05/05/18 0603 05/06/18 0601  WBC 12.1* 8.6 13.9* 10.3  HGB 10.7* 10.7* 10.3* 10.3*  HCT 32.7* 34.0* 31.7* 31.7*  PLT 173 144* 143* 154     COAGS: No results for input(s): INR, APTT in the last 8760 hours.  BMP: Recent Labs    05/03/18 1628 05/04/18 [id number] 05/05/18 0603 05/06/18 0601 03/19/19 1321  NA 136 138 138 139  --   K 3.2* 4.0 4.4 4.2  --   CL 103 108 108 109  --   CO2 21* 19* 22 23  --   GLUCOSE 103* 155* 136* 103*  --   BUN 25* 25* 32* 34*  --   CALCIUM 8.7* 8.7* 8.7* 8.6*  --   CREATININE 1.66* 1.49* 1.43* 1.33* 1.50*  GFRNONAA 44* 50* 52* 57*  --   GFRAA 51* 58* >60 >60  --     LIVER FUNCTION TESTS: Recent Labs    05/03/18 1628  BILITOT 1.5*  AST 35  ALT 20  ALKPHOS 62  PROT 8.0  ALBUMIN 3.3*    Assessment and Plan:  2.5 years status post CT cryoablation of a 1.4 cm left renal cell mass compatible with renal cell carcinoma.  Stable ablation defect with some mild peripheral enhancement remain indeterminate for residual tumor versus granulation.  Certainly no enlargement by MRI.  No new renal abnormality.  Patient remains asymptomatic.  Plan: Continue surveillance at 12 months with repeat MRI.   Electronically Signed: Greggory Keen 03/24/2019, 1:23 PM   I spent a total of    25 Minutes in remote  clinical consultation, greater than 50% of which was counseling/coordinating care for this patient with a left renal mass status post cryo.    Visit type: Audio only (telephone). Audio (no video) only due to patient's lack of internet/smartphone capability. Alternative for in-person consultation at [facility], [address] [place], [place], [place]. This visit type was conducted due to national recommendations for restrictions regarding the COVID-19 Pandemic (e.g. social distancing).  This format is felt to be most appropriate for this patient at this time.  All issues noted in this document were discussed and addressed.

## 2019-04-07 ENCOUNTER — Ambulatory Visit: Payer: Medicaid Other | Admitting: Podiatry

## 2019-04-07 ENCOUNTER — Other Ambulatory Visit: Payer: Self-pay

## 2019-04-07 ENCOUNTER — Encounter: Payer: Self-pay | Admitting: Podiatry

## 2019-04-07 DIAGNOSIS — B351 Tinea unguium: Secondary | ICD-10-CM

## 2019-04-07 DIAGNOSIS — M79676 Pain in unspecified toe(s): Secondary | ICD-10-CM | POA: Diagnosis not present

## 2019-04-15 NOTE — Progress Notes (Signed)
Subjective:  John [name] presents to clinic today with cc of  painful, thick, discolored, elongated toenails  of both feet that become tender and patient cannot cut because of thickness. Pain is aggravated when wearing enclosed shoe gear and relieved with periodic professional debridement.  Patient voices no new pedal concerns on today's visit.  Medications reviewed in chart.  Allergies  Allergen Reactions  . Penicillins Other (See Comments)    Convulsions, Patient could not walk, childhood allergy   Has patient had a PCN reaction causing immediate rash, facial/tongue/throat swelling, SOB or lightheadedness with hypotension: No Has patient had a PCN reaction causing severe rash involving mucus membranes or skin necrosis: No Has patient had a PCN reaction that required hospitalization No Has patient had a PCN reaction occurring within the last 10 years: No If all of the above answers are "NO", then may proceed with Cephalosporin use.  Objective:  Physical Examination:  Vascular Examination: Capillary refill time immediate b/l.  DP/PT pulses remain b/l.  Digital hair present b/l.  No edema noted b/l.  Skin temperature gradient WNL b/l.  Dermatological Examination: Skin with normal turgor, texture and tone b/l.  Elongated, thick, discolored brittle toenails with subungual debris and pain on dorsal palpation of nailbeds 1-5 b/l.  Musculoskeletal Examination: Muscle strength 5/5 to all muscle groups b/l.  Elevatus left hallux. Hammertoes 2-5 b/l.  HAV with bunion b/l.  No pain, crepitus or joint discomfort with active/passive ROM.  Neurological Examination: Sensation intact 5/5 b/l with 10 gram monofilament.  Assessment: Mycotic nail infection with pain 1-5 b/l  Plan: 1. Toenails 1-5 b/l were debrided in length and girth without iatrogenic laceration. 2.  Continue soft, supportive shoe gear daily. 3.  Report any pedal injuries to medical professional. 4.   Follow up 3 months. 5.  Patient/POA to call should there be a question/concern in there interim.

## 2019-04-27 DIAGNOSIS — J4520 Mild intermittent asthma, uncomplicated: Secondary | ICD-10-CM | POA: Insufficient documentation

## 2019-04-27 DIAGNOSIS — K429 Umbilical hernia without obstruction or gangrene: Secondary | ICD-10-CM | POA: Insufficient documentation

## 2019-04-30 HISTORY — PX: COLONOSCOPY: SHX174

## 2019-05-07 ENCOUNTER — Ambulatory Visit: Payer: Medicaid Other | Admitting: Podiatry

## 2019-07-07 ENCOUNTER — Ambulatory Visit: Payer: Medicaid Other | Admitting: Podiatry

## 2019-07-07 ENCOUNTER — Other Ambulatory Visit: Payer: Self-pay

## 2019-07-07 ENCOUNTER — Encounter: Payer: Self-pay | Admitting: Podiatry

## 2019-07-07 VITALS — Temp 97.1°F

## 2019-07-07 DIAGNOSIS — M79676 Pain in unspecified toe(s): Secondary | ICD-10-CM

## 2019-07-07 DIAGNOSIS — M2012 Hallux valgus (acquired), left foot: Secondary | ICD-10-CM | POA: Diagnosis not present

## 2019-07-07 DIAGNOSIS — B351 Tinea unguium: Secondary | ICD-10-CM

## 2019-07-07 DIAGNOSIS — M2011 Hallux valgus (acquired), right foot: Secondary | ICD-10-CM

## 2019-07-07 DIAGNOSIS — M79675 Pain in left toe(s): Secondary | ICD-10-CM

## 2019-07-07 NOTE — Patient Instructions (Signed)

## 2019-07-15 NOTE — Progress Notes (Signed)
Subjective: John [name] presents today for follow up of painful mycotic nails b/l that are difficult to trim. Pain interferes with ambulation. Aggravating factors include wearing enclosed shoe gear. Pain is relieved with periodic professional debridement.   Today, he is c/o painful left hallux, plantarlateral aspect of digit. Denies any trauma to area.Denies any redness, drainage or swelling.   Allergies  Allergen Reactions  . Penicillins Other (See Comments)    Convulsions, Patient could not walk, childhood allergy   Has patient had a PCN reaction causing immediate rash, facial/tongue/throat swelling, SOB or lightheadedness with hypotension: No Has patient had a PCN reaction causing severe rash involving mucus membranes or skin necrosis: No Has patient had a PCN reaction that required hospitalization No Has patient had a PCN reaction occurring within the last 10 years: No If all of the above answers are "NO", then may proceed with Cephalosporin use.  Marland Kitchen Spironolactone Other (See Comments)    Gynecomastia      Objective: Vitals:   07/07/19 1555  Temp: (!) 97.1 F (36.2 C)    Pt 64 y.o. year old AA  male obese in NAD. AAO x 3.   Vascular Examination:  Capillary refill time to digits immediate b/l. Palpable DP pulses b/l. Palpable PT pulses b/l. Pedal hair sparse b/l. Skin temperature gradient within normal limits b/l.  Dermatological Examination: Pedal skin with normal turgor, texture and tone bilaterally. No open wounds bilaterally. No interdigital macerations bilaterally. Toenails 1-5 b/l elongated, dystrophic, thickened, crumbly with subungual debris and tenderness to dorsal palpation.  Inspection of left hallux reveals no open wounds. No blisters. No erythema, no edema, no drainage. He does have crease due to severe bunion deformity and digit abutting 2nd digit. Webspace and sulcus clear.   Musculoskeletal: Normal muscle strength 5/5 to all lower extremity muscle groups  bilaterally, no pain crepitus or joint limitation noted with ROM b/l, pes planus deformity noted and severe hallux abductovalgus deformity b/l  Neurological: Protective sensation intact 5/5 intact bilaterally with 10g monofilament b/l  Assessment: 1. Pain due to onychomycosis of toenail   2. Hallux valgus, acquired, bilateral   3. Pain of toe of left foot    Plan: -Toenails 1-5 b/l were debrided in length and girth with sterile nail nippers and dremel without iatrogenic bleeding.  -Patient to continue soft, supportive shoe gear daily. -Patient to report any pedal injuries to medical professional immediately. -Dispensed foam toe separator for b/l first innerspaces.  Apply every morning. Remove every evening. -Patient/POA to call should there be question/concern in the interim.  Return in about 3 months (around 10/07/2019) for nail trim.

## 2019-10-11 ENCOUNTER — Ambulatory Visit: Payer: Medicaid Other | Admitting: Podiatry

## 2019-12-06 ENCOUNTER — Ambulatory Visit: Payer: Medicaid Other | Admitting: Podiatry

## 2019-12-06 ENCOUNTER — Other Ambulatory Visit: Payer: Self-pay

## 2019-12-06 ENCOUNTER — Ambulatory Visit (INDEPENDENT_AMBULATORY_CARE_PROVIDER_SITE_OTHER): Payer: Medicaid Other

## 2019-12-06 DIAGNOSIS — M2012 Hallux valgus (acquired), left foot: Secondary | ICD-10-CM

## 2019-12-06 DIAGNOSIS — M2011 Hallux valgus (acquired), right foot: Secondary | ICD-10-CM

## 2019-12-06 DIAGNOSIS — M79675 Pain in left toe(s): Secondary | ICD-10-CM

## 2019-12-06 DIAGNOSIS — M2041 Other hammer toe(s) (acquired), right foot: Secondary | ICD-10-CM

## 2019-12-06 DIAGNOSIS — M2042 Other hammer toe(s) (acquired), left foot: Secondary | ICD-10-CM | POA: Diagnosis not present

## 2019-12-06 NOTE — Progress Notes (Signed)
Subjective: 64 y.o. male presenting today for evaluation of bilateral foot pain.  The patient is concerned that he may need surgery for his bilateral bunions.  He states that he has had bunions for several years however they have been increasingly painful over the past year or 2.  Aggravated by walking.  He has tried wearing different shoes but has no relief.  He presents today for further treatment and evaluation   Past Medical History:  Diagnosis Date  . Arthritis   . Ascites   . Cataract   . CHF (congestive heart failure) ([place])   . Chronic back pain   . Chronic kidney disease   . Cirrhosis ([place])   . Dyspnea    walking   . ETOH abuse   . GERD (gastroesophageal reflux disease)   . Hepatitis   . History of blood transfusion   . History of bronchitis   . Hypertension   . Neuromuscular disorder ([place])   . Optic neuropathy, left   . Polysubstance abuse ([place])   . Seizures [facility])    age 41  . Sickle cell trait ([place])   . Stroke [facility]) 1998    Objective: Physical Exam General: The patient is alert and oriented x3 in no acute distress.  Dermatology: Skin is cool, dry and supple bilateral lower extremities. Negative for open lesions or macerations.  Vascular: Palpable pedal pulses bilaterally. No edema or erythema noted. Capillary refill within normal limits.  Neurological: Epicritic and protective threshold grossly intact bilaterally.   Musculoskeletal Exam: Clinical evidence of bunion deformity noted to the respective foot. There is moderate pain on palpation range of motion of the first MPJ. Lateral deviation of the hallux noted consistent with hallux abductovalgus. Lateral deviation noted at the MTP joints of the second third and fourth toes of the bilateral feet. Symptomatic pain on palpation and range of motion also noted to the metatarsal phalangeal joints of the respective hammertoe digits.    Radiographic Exam: Increased intermetatarsal angle greater than 15 with a hallux  abductus angle greater than 30 noted on AP view. Moderate degenerative changes noted within the first MPJ. Contracture deformity also noted to the interphalangeal joints and MPJs of the digits of the respective hammertoes.  There also appears to be elongated second metatarsal as well as third metatarsal in relationship to the overall metatarsal parabola.    Assessment: 1. HAV w/ bunion deformity bilateral 2.  Lateral deviation of the digits 2-4 bilateral at the level of the MTPJ 3.  Elongated second and third metatarsals bilateral   Plan of Care:  1. Patient was evaluated. X-Rays reviewed. 2. Today we discussed the conservative versus surgical management of the presenting pathology. The patient opts for surgical management. All possible complications and details of the procedure were explained. All patient questions were answered. No guarantees were expressed or implied. 3. Authorization for surgery was initiated today. Surgery will consist of bunionectomy with double osteotomy left.  MTPJ capsulotomy 2-4 left.  Weil decompression osteotomy second and third metatarsals left. 4.  Patient will require medical clearance from PCP prior to surgery 5.  Return to clinic 1 week postop      Edrick Kins, DPM [facility]  Dr. Edrick Kins, [address]  [place], [place] [address]                Office [phone]  Fax [phone]

## 2019-12-13 ENCOUNTER — Encounter: Payer: Self-pay | Admitting: *Deleted

## 2019-12-28 ENCOUNTER — Telehealth: Payer: Self-pay

## 2019-12-28 NOTE — Telephone Encounter (Signed)
John [name] was scheduled for foot surgery with Dr. Amalia Hailey on 12/30/19 but we had to cancel the surgery because we are not able to obtain medical clearance from John [name]'s PCP. He has not been seen by PCP since 1/21. Informed John [name] that he will need to see his PCP and then we can get his surgery reschedule. Notified John [name] at [facility] and Dr. Amalia Hailey

## 2020-01-05 ENCOUNTER — Encounter: Payer: Medicaid Other | Admitting: Podiatry

## 2020-01-12 ENCOUNTER — Encounter: Payer: Medicaid Other | Admitting: Podiatry

## 2020-01-26 ENCOUNTER — Encounter: Payer: Medicaid Other | Admitting: Podiatry

## 2020-01-31 ENCOUNTER — Ambulatory Visit: Payer: Medicaid Other | Admitting: Emergency Medicine

## 2020-04-12 ENCOUNTER — Other Ambulatory Visit: Payer: Self-pay | Admitting: Interventional Radiology

## 2020-04-12 ENCOUNTER — Other Ambulatory Visit: Payer: Self-pay

## 2020-04-12 DIAGNOSIS — C642 Malignant neoplasm of left kidney, except renal pelvis: Secondary | ICD-10-CM

## 2020-04-27 ENCOUNTER — Ambulatory Visit (HOSPITAL_COMMUNITY): Payer: Medicaid Other

## 2020-05-01 ENCOUNTER — Ambulatory Visit: Payer: Medicaid Other | Admitting: Podiatry

## 2020-05-02 ENCOUNTER — Telehealth: Payer: Medicaid Other

## 2020-05-08 ENCOUNTER — Ambulatory Visit (INDEPENDENT_AMBULATORY_CARE_PROVIDER_SITE_OTHER): Payer: Medicaid Other | Admitting: Podiatry

## 2020-05-08 ENCOUNTER — Other Ambulatory Visit: Payer: Self-pay

## 2020-05-08 DIAGNOSIS — M2042 Other hammer toe(s) (acquired), left foot: Secondary | ICD-10-CM | POA: Diagnosis not present

## 2020-05-08 DIAGNOSIS — M2012 Hallux valgus (acquired), left foot: Secondary | ICD-10-CM

## 2020-05-08 DIAGNOSIS — M2011 Hallux valgus (acquired), right foot: Secondary | ICD-10-CM

## 2020-05-08 DIAGNOSIS — M2041 Other hammer toe(s) (acquired), right foot: Secondary | ICD-10-CM | POA: Diagnosis not present

## 2020-05-08 NOTE — Progress Notes (Signed)
Subjective: 65 y.o. male presenting today for follow-up evaluation of bilateral foot pain.  The patient was originally scheduled and arranged for surgery in August 2021.  At that time he was very busy and unable to proceed with the surgery at that time.  He presents today to discuss surgery again and he would like to proceed with surgery.   Past Medical History:  Diagnosis Date  . Arthritis   . Ascites   . Cataract   . CHF (congestive heart failure) ([place])   . Chronic back pain   . Chronic kidney disease   . Cirrhosis ([place])   . Dyspnea    walking   . ETOH abuse   . GERD (gastroesophageal reflux disease)   . Hepatitis   . History of blood transfusion   . History of bronchitis   . Hypertension   . Neuromuscular disorder ([place])   . Optic neuropathy, left   . Polysubstance abuse ([place])   . Seizures [facility])    age 31  . Sickle cell trait ([place])   . Stroke [facility]) 1998    Objective: Physical Exam General: The patient is alert and oriented x3 in no acute distress.  Dermatology: Skin is cool, dry and supple bilateral lower extremities. Negative for open lesions or macerations.  Vascular: Palpable pedal pulses bilaterally. No edema or erythema noted. Capillary refill within normal limits.  Neurological: Epicritic and protective threshold grossly intact bilaterally.   Musculoskeletal Exam: Clinical evidence of bunion deformity noted to the respective foot. There is moderate pain on palpation range of motion of the first MPJ. Lateral deviation of the hallux noted consistent with hallux abductovalgus. Lateral deviation noted at the MTP joints of the second third and fourth toes of the bilateral feet. Symptomatic pain on palpation and range of motion also noted to the metatarsal phalangeal joints of the respective hammertoe digits.    Radiographic Exam: Increased intermetatarsal angle greater than 15 with a hallux abductus angle greater than 30 noted on AP view. Moderate degenerative changes  noted within the first MPJ. Contracture deformity also noted to the interphalangeal joints and MPJs of the digits of the respective hammertoes.  There also appears to be elongated second metatarsal as well as third metatarsal in relationship to the overall metatarsal parabola.    Assessment: 1. HAV w/ bunion deformity bilateral 2.  Lateral deviation of the digits 2-4 bilateral at the level of the MTPJ 3.  Elongated second and third metatarsals bilateral   Plan of Care:  1. Patient was evaluated. X-Rays reviewed again today. 2. Today we discussed the conservative versus surgical management of the presenting pathology. The patient opts for surgical management. All possible complications and details of the procedure were explained. All patient questions were answered. No guarantees were expressed or implied. 3. Authorization for surgery was initiated today.  Surgery will consist of a more aggressive and definitive approach to surgery that was originally planned in August.  Surgery will consist of first MTPJ arthrodesis left.  Metatarsal head resections 2-4 left.  PIPJ arthroplasty 2-4 left.   4.  Patient will require medical clearance from PCP prior to surgery 5.  Return to clinic 1 week postop      Edrick Kins, DPM [facility]  Dr. Edrick Kins, [place]  [place], [place] [address]                Office [phone]  Fax [phone]

## 2020-05-09 ENCOUNTER — Other Ambulatory Visit: Payer: Self-pay

## 2020-05-09 ENCOUNTER — Ambulatory Visit (HOSPITAL_COMMUNITY)
Admission: RE | Admit: 2020-05-09 | Discharge: 2020-05-09 | Disposition: A | Discharge status: Home / Self Care | Payer: Medicaid Other | Source: Ambulatory Visit | Attending: Interventional Radiology | Admitting: Interventional Radiology

## 2020-05-09 DIAGNOSIS — C642 Malignant neoplasm of left kidney, except renal pelvis: Secondary | ICD-10-CM

## 2020-05-09 MED ORDER — GADOBUTROL 1 MMOL/ML IV SOLN
10.0000 mL | Freq: Once | INTRAVENOUS | Status: AC | PRN
  Administered 2020-05-09: 10 mL via INTRAVENOUS

## 2020-05-17 ENCOUNTER — Other Ambulatory Visit: Payer: Self-pay

## 2020-05-17 ENCOUNTER — Ambulatory Visit
Admission: RE | Admit: 2020-05-17 | Discharge: 2020-05-17 | Disposition: A | Discharge status: Home / Self Care | Payer: Medicaid Other | Source: Ambulatory Visit | Attending: Interventional Radiology | Admitting: Interventional Radiology

## 2020-05-17 ENCOUNTER — Encounter: Payer: Self-pay | Admitting: *Deleted

## 2020-05-17 DIAGNOSIS — C642 Malignant neoplasm of left kidney, except renal pelvis: Secondary | ICD-10-CM

## 2020-05-17 HISTORY — PX: IR RADIOLOGIST EVAL & MGMT: IMG5224

## 2020-05-17 NOTE — Progress Notes (Signed)
Patient ID: Rodrigus Kilker, male   DOB: 03/13/1956, 65 y.o.   MRN: [id number]       Chief Complaint:  Status post left renal cryoablation  Referring Physician(s): [facility]  History of Present Illness: Cyris Maalouf is a 65 y.o. male with a history of mild chronic renal insufficiency, hepatitis C, cirrhosis and portal hypertension.  He is known to our service because he required balloon retrograde transvenous obliteration of gastric varices in 2016.  Imaging work-up in 2016 revealed a solid enhancing left renal mass compatible with renal cell carcinoma by imaging.  He underwent CT-guided cryoablation at [facility] November 08, 2016.  Over the last year no new issues or clinical change.  Stable weight and appetite.  No abdominal pain, flank pain, dysuria hematuria.  No acute new urinary tract symptoms.  No recent illness or fevers.  He has been COVID vaccinated.  Today's visit is for review of annual surveillance imaging.  He had an MRI scan at [facility] hospital 05/09/2020.  This demonstrates left upper pole ablation defect with an adjacent area measuring up to 1.7 cm with enhancement concerning for local regional recurrence.  This area has enlarged previously measuring 1.4 cm.  No other adenopathy or evidence of metastatic disease.  Past Medical History:  Diagnosis Date  . Arthritis   . Ascites   . Cataract   . CHF (congestive heart failure) ([place])   . Chronic back pain   . Chronic kidney disease   . Cirrhosis ([place])   . Dyspnea    walking   . ETOH abuse   . GERD (gastroesophageal reflux disease)   . Hepatitis   . History of blood transfusion   . History of bronchitis   . Hypertension   . Neuromuscular disorder ([place])   . Optic neuropathy, left   . Polysubstance abuse ([place])   . Seizures [facility])    age 54  . Sickle cell trait ([place])   . Stroke [facility]) 1998    Past Surgical History:  Procedure Laterality Date  . ESOPHAGOGASTRODUODENOSCOPY N/A 08/14/2014   Procedure:  ESOPHAGOGASTRODUODENOSCOPY (EGD);  Surgeon: Ladene Artist, MD;  Location: [facility];  Service: Endoscopy;  Laterality: N/A;  . ESOPHAGOGASTRODUODENOSCOPY (EGD) WITH PROPOFOL N/A 06/03/2017   Procedure: ESOPHAGOGASTRODUODENOSCOPY (EGD) WITH PROPOFOL;  Surgeon: Ladene Artist, MD;  Location: [facility];  Service: Endoscopy;  Laterality: N/A;  . IR RADIOLOGIST EVAL & MGMT  09/25/2016  . IR RADIOLOGIST EVAL & MGMT  12/10/2016  . IR RADIOLOGIST EVAL & MGMT  03/25/2018  . IR RADIOLOGIST EVAL & MGMT  03/24/2019  . none    . RADIOFREQUENCY ABLATION Left 11/08/2016   Procedure: LEFT RENAL CRYOABLATION;  Surgeon: Greggory Keen, MD;  Location: [facility] ORS;  Service: Anesthesiology;  Laterality: Left;  . RADIOLOGY WITH ANESTHESIA N/A 08/15/2014   Procedure: RADIOLOGY WITH ANESTHESIA;  Surgeon: Greggory Keen, MD;  Location: [facility];  Service: Radiology;  Laterality: N/A;    Allergies: Penicillins and Spironolactone  Medications: Prior to Admission medications   Medication Sig Start Date End Date Taking? Authorizing Provider  allopurinol (ZYLOPRIM) 100 MG tablet Take 1 tablet (100 mg total) by mouth daily. 05/12/18   Eugenie Filler, MD  amLODipine (NORVASC) 10 MG tablet Take 1 tablet (10 mg total) by mouth daily. 05/25/15   Debbe Odea, MD  atorvastatin (LIPITOR) 20 MG tablet Take by mouth. 04/29/19 04/28/20  [provider]  BD VEO INSULIN SYR U/F 1/2UNIT 31G X 15/64" 0.3 ML MISC See admin  instructions. 09/22/18   [provider]  clobetasol (TEMOVATE) 0.05 % external solution Apply 1 application topically 2 (two) times daily as needed (skin irritation).  10/01/17   [provider]  colchicine 0.6 MG tablet Take 1 tablet (0.6 mg total) by mouth daily. 05/07/18   Eugenie Filler, MD  cyclobenzaprine (FLEXERIL) 10 MG tablet Take 1 tablet (10 mg total) by mouth 2 (two) times daily as needed for muscle spasms. 10/17/18   Scot Jun, FNP  ENULOSE 10 GM/15ML SOLN TAKE 30ML BY  MOUTH TWICE DAILY 07/31/18   [provider]  famotidine (PEPCID) 40 MG tablet Take 40 mg by mouth daily.  07/25/17   [provider]  FLOVENT HFA 220 MCG/ACT inhaler INHALE TWO PUFFS BY MOUTH INTO THE LUNGS 2 (TWO) TIMES DAILY. 01/27/19   [provider]  fluticasone Asencion Islam) 50 MCG/ACT nasal spray Use 1 squirt to each nostril daily as needed for sinus symptoms 01/27/19   [provider]  folic acid (FOLVITE) 1 MG tablet Take 1 tablet (1 mg total) by mouth daily. 05/07/18   Eugenie Filler, MD  furosemide (LASIX) 40 MG tablet Take 40 mg by mouth daily.    [provider]  hydrALAZINE (APRESOLINE) 10 MG tablet Take 10 mg by mouth 3 (three) times daily. 07/28/18   [provider]  hydrALAZINE (APRESOLINE) 50 MG tablet Take 1 tablet (50 mg total) by mouth 3 (three) times daily. 05/25/15   Debbe Odea, MD  HYDROcodone-acetaminophen (NORCO/VICODIN) 5-325 MG tablet Take 1 tablet by mouth every 6 (six) hours as needed for moderate pain or severe pain.  02/20/18   [provider]  ketoconazole (NIZORAL) 2 % cream APPLY TO BOTH FEET AND BETWEEN TOES ONCE DAILY FOR 6 WEEKS. 07/29/18   Marzetta Board, DPM  lactulose (CHRONULAC) 10 GM/15ML solution Take 10 g by mouth daily as needed for mild constipation or moderate constipation.  11/01/16   [provider]  meloxicam (MOBIC) 15 MG tablet Take 15 mg by mouth daily. 01/21/19   [provider]  methocarbamol (ROBAXIN) 500 MG tablet Take 500 mg by mouth every 6 (six) hours as needed for muscle spasms.  01/29/18   [provider]  methylPREDNISolone (MEDROL DOSEPAK) 4 MG TBPK tablet See admin instructions. follow package directions 10/19/18   [provider]  omeprazole (PRILOSEC) 40 MG capsule TAKE ONE CAPSULE BY MOUTH DAILY 07/02/18   Ladene Artist, MD  PROAIR HFA 108 [phone] Base) MCG/ACT inhaler Inhale 1-2 puffs into the lungs every 6 (six) hours as needed for wheezing or  shortness of breath.  10/02/17   [provider]  spironolactone (ALDACTONE) 25 MG tablet TAKE ONE TABLET (25 MG DOSE) BY MOUTH DAILY. 06/13/18   [provider]     Family History  Problem Relation Age of Onset  . Hypertension Mother        Living  . Kidney disease Mother   . Diabetes Mother   . Heart disease Mother   . Hypertension Father        Deceased, 45  . Ulcers Father   . Stomach cancer Father   . Hypertension Brother   . Diabetes Brother   . Kidney disease Brother   . Hypertension Sister   . Colon cancer Neg Hx   . Esophageal cancer Neg Hx   . Rectal cancer Neg Hx     Social History   Socioeconomic History  . Marital status: Single  Spouse name: Not on file  . Number of children: 0  . Years of education: Not on file  . Highest education level: Not on file  Occupational History  . Occupation: retired  Tobacco Use  . Smoking status: Former Smoker    Packs/day: 0.30    Years: 20.00    Pack years: 6.00    Types: Cigarettes  . Smokeless tobacco: Never Used  . Tobacco comment: 2015  Vaping Use  . Vaping Use: Never used  Substance and Sexual Activity  . Alcohol use: No    Alcohol/week: 0.0 standard drinks  . Drug use: No    Types: Cocaine    Comment: since fall 2015  . Sexual activity: Not on file  Other Topics Concern  . Not on file  Social History Narrative   Lives with niece in a 2 story home.     On disability since 2015 for low back pain.  Used to work Statistician.    Highest level of education: 11th grade   Social Determinants of Health   Financial Resource Strain: Not on file  Food Insecurity: Not on file  Transportation Needs: Not on file  Physical Activity: Not on file  Stress: Not on file  Social Connections: Not on file    ECOG Status: 1 - Symptomatic but completely ambulatory  Review of Systems  Review of Systems: A 12 point ROS discussed and pertinent positives are indicated in the HPI above.  All other  systems are negative.  Physical Exam No direct physical exam was performed, telephone health visit only today because of COVID pandemic Vital Signs: There were no vitals taken for this visit.  Imaging: MR ABDOMEN WWO CONTRAST  Result Date: 05/09/2020 CLINICAL DATA:  Follow-up left renal cryoablation. EXAM: MRI ABDOMEN WITHOUT AND WITH CONTRAST TECHNIQUE: Multiplanar multisequence MR imaging of the abdomen was performed both before and after the administration of intravenous contrast. CONTRAST:  68mL GADAVIST GADOBUTROL 1 MMOL/ML IV SOLN COMPARISON:  Multiple previous MRI examinations. The most recent is 03/19/2019. FINDINGS: Lower chest: The lung bases are clear of an acute process. No worrisome pulmonary lesions are identified. The heart is normal in size. No pericardial effusion. Hepatobiliary: Stable advanced cirrhotic changes involving the liver. Stable changes of portal venous hypertension with portal venous collaterals and fairly extensive paraesophageal varices. No worrisome early arterial phase enhancing lesions to suggest dysplastic nodules or HCC. No intra or extrahepatic biliary dilatation. Gallbladder is mildly contracted. Pancreas:  No mass, inflammation or ductal dilatation. Spleen:  Stable mild splenomegaly. Adrenals/Urinary Tract: The adrenal glands are unremarkable and stable. The right kidney is unremarkable and stable. Small stable parenchymal cyst. Left kidney demonstrates stable upper pole ablation defect. Again demonstrated is a adjacent lesion showing increased T2 signal intensity and moderate heterogeneous contrast enhancement consistent with local recurrent tumor. It measures 1.7 x 1.3 cm previously measured 1.4 x 1.1 cm. Adjacent nonenhancing complex/hemorrhagic or proteinaceous cyst is stable. No new renal lesions. Stomach/Bowel: Stomach, duodenum, small bowel and colon are grossly normal. No acute inflammatory changes or mass lesions. Vascular/Lymphatic: The aorta and branch  vessels are patent. The major venous structures are patent. Stable scattered upper abdominal lymph nodes typical with cirrhosis. No retroperitoneal lymphadenopathy. The left renal vein is patent. Other: No ascites. Stable periumbilical abdominal wall hernia containing fat and bowel. Musculoskeletal: No significant bony findings. No findings suspicious for osseous metastatic disease. IMPRESSION: 1. Left upper pole ablation defect with adjacent local recurrent enhancing lesion measuring 1.7 x 1.3 cm.  This has enlarged since the prior MRI where it measured 1.4 x 1.1 cm. No locoregional adenopathy or metastatic disease elsewhere. 2. Stable advanced cirrhotic changes involving the liver with changes of portal venous hypertension, portal venous collaterals and fairly extensive paraesophageal varices. No ascites. 3. Stable periumbilical abdominal wall hernia containing fat and bowel. Electronically Signed   By: Marijo Sanes M.D.   On: 05/09/2020 10:37    Labs:  CBC: No results for input(s): WBC, HGB, HCT, PLT in the last 8760 hours.  COAGS: No results for input(s): INR, APTT in the last 8760 hours.  BMP: No results for input(s): NA, K, CL, CO2, GLUCOSE, BUN, CALCIUM, CREATININE, GFRNONAA, GFRAA in the last 8760 hours.  Invalid input(s): CMP  LIVER FUNCTION TESTS: No results for input(s): BILITOT, AST, ALT, ALKPHOS, PROT, ALBUMIN in the last 8760 hours.  TUMOR MARKERS: No results for input(s): AFPTM, CEA, CA199, CHROMGRNA in the last 8760 hours.  Assessment and Plan:  Slowly enlarging 1.7 cm left ablation defect marginal enhancement/lesion concerning for local recurrence.  Lesion would be amenable to repeat image guided ablation.  The repeat procedure was discussed in detail with the patient.  All questions addressed.  He understands this may require 1 night recovery, as well as will be done under general anesthesia.  The procedure, risk, benefits and alternatives were reviewed including continued  surveillance.  After discussion he would like to proceed with a repeat treatment.  This can be scheduled electively at [facility].   Plan: Scheduled for elective repeat CT-guided left renal cryoablation.  This is for ablation site local regional recurrence by MRI.   Electronically Signed: Greggory Keen 05/17/2020, 10:35 AM   I spent a total of    25 Minutes in remote  clinical consultation, greater than 50% of which was counseling/coordinating care for this patient with left renal cell carcinoma.    Visit type: Audio only (telephone). Audio (no video) only due to patient's lack of internet/smartphone capability. Alternative for in-person consultation at [facility], [address] [place], [place], [place]. This visit type was conducted due to national recommendations for restrictions regarding the COVID-19 Pandemic (e.g. social distancing).  This format is felt to be most appropriate for this patient at this time.  All issues noted in this document were discussed and addressed.

## 2020-05-24 ENCOUNTER — Other Ambulatory Visit (INDEPENDENT_AMBULATORY_CARE_PROVIDER_SITE_OTHER): Payer: Medicaid Other

## 2020-05-24 ENCOUNTER — Ambulatory Visit (INDEPENDENT_AMBULATORY_CARE_PROVIDER_SITE_OTHER): Payer: Medicaid Other | Admitting: Gastroenterology

## 2020-05-24 ENCOUNTER — Encounter: Payer: Self-pay | Admitting: Gastroenterology

## 2020-05-24 ENCOUNTER — Other Ambulatory Visit: Payer: Self-pay

## 2020-05-24 ENCOUNTER — Telehealth: Payer: Self-pay

## 2020-05-24 VITALS — BP 110/72 | HR 63 | Ht 68.0 in | Wt 245.6 lb

## 2020-05-24 DIAGNOSIS — Z86010 Personal history of colonic polyps: Secondary | ICD-10-CM

## 2020-05-24 DIAGNOSIS — I8500 Esophageal varices without bleeding: Secondary | ICD-10-CM

## 2020-05-24 DIAGNOSIS — Z8619 Personal history of other infectious and parasitic diseases: Secondary | ICD-10-CM

## 2020-05-24 DIAGNOSIS — K7030 Alcoholic cirrhosis of liver without ascites: Secondary | ICD-10-CM

## 2020-05-24 LAB — PROTIME-INR
INR: 1.1 ratio — ABNORMAL HIGH (ref 0.8–1.0)
Prothrombin Time: 12.6 s (ref 9.6–13.1)

## 2020-05-24 NOTE — Patient Instructions (Signed)
Your provider has requested that you go to the basement level for lab work before leaving today. Press "B" on the elevator. The lab is located at the first door on the left as you exit the elevator.  You have been scheduled for an endoscopy. Please follow written instructions given to you at your visit today. If you use inhalers (even only as needed), please bring them with you on the day of your procedure.  Due to recent changes in healthcare laws, you may see the results of your imaging and laboratory studies on MyChart before your provider has had a chance to review them.  We understand that in some cases there may be results that are confusing or concerning to you. Not all laboratory results come back in the same time frame and the provider may be waiting for multiple results in order to interpret others.  Please give [place] 48 hours in order for your provider to thoroughly review all the results before contacting the office for clarification of your results.   Normal BMI (Body Mass Index- based on height and weight) is between 19 and 25. Your BMI today is Body mass index is 37.34 kg/m. Marland Kitchen Please consider follow up  regarding your BMI with your Primary Care Provider.  Thank you for choosing me and [facility].  Pricilla Riffle. Dagoberto Ligas., MD., Marval Regal

## 2020-05-24 NOTE — Telephone Encounter (Signed)
Left message for patient to please call back. 

## 2020-05-24 NOTE — Progress Notes (Signed)
    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 History of bleeding gastric varices treated with BRTO and HE. Felt not to be a TIPS candidate at that time. Last contact with [place] was an EGD in 02/2018. Last office visit with [place] was in 03/2017. Hep C genotype 1a treated with 24 weeks of Epclusa in 2019 at [facility]. No alcohol since 2016.  He has no ongoing gastrointestinal complaints.  Records indicate he had blood work performed with his PCP in September however we do not have those records today.  Unfortunately the patient did not bring his medications with him today and he is not clear on exactly what medications and dosages he is taking.  MR abdomen with / without contrast performed May 09, 2020 for follow-up of a left renal cryoablation showed stable advanced cirrhotic changes of the liver, stable changes of portal venous hypertension with portal venous collaterals and fairly extensive paraesophageal varices.  No worrisome early arterial phase enhancing lesions to suggest dysplastic nodules or HCC.  Gallbladder was mildly contracted and no intra or extrahepatic biliary dilatation was noted.  Mild splenomegaly was noted.  No ascites noted  Current Medications, Allergies, Past Medical History, Past Surgical History, Family History and Social History were reviewed in [employer] record.   Physical Exam: General: Well developed, well nourished, no acute distress Head: Normocephalic and atraumatic Eyes:  sclerae anicteric, EOMI Ears: Normal auditory acuity Mouth: Not examined, mask on during Covid-19 pandemic Lungs: Clear throughout to auscultation Heart: Regular rate and rhythm; no murmurs, rubs or bruits Abdomen: Soft, non tender and non distended. No masses, hepatosplenomegaly or hernias noted. Normal Bowel sounds Rectal: Not done Musculoskeletal: Symmetrical with no gross deformities  Pulses:  Normal  pulses noted Extremities: No clubbing, cyanosis, edema or deformities noted Neurological: Alert oriented x 4, grossly nonfocal Psychological:  Alert and cooperative. Normal mood and affect   Assessment and Recommendations:  1. 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 Bleeding gastric varices in 2019 treated with BRTO. Not felt to be a TIPS candidate at that time.  Hep C treated with 24 weeks of Epclusa at [facility].  He has not been compliant with recommended follow-up visits with [place] or [facility] with no office visits since 2018.  Recommend every 21-month follow-up office visit and commended to bring all current medications with him to each office visit.  Request CMP, CBC, TSH performed through his PCPs office in December.  Obtain AFP, PT/INR and HCV RNA today.  Schedule EGD to reassess gastric and esophageal varices. The risks (including bleeding, perforation, infection, missed lesions, medication reactions and possible hospitalization or surgery if complications occur), benefits, and alternatives to endoscopy with possible biopsy and possible dilation were discussed with the patient and they consent to proceed.   2.  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  Consider surveillance colonoscopy at 3 years in November 2022. Based on his overall health status and on the status of his liver disease we may defer this exam.

## 2020-05-24 NOTE — Telephone Encounter (Signed)
-----   Message from Ladene Artist, MD sent at 05/24/2020  1:37 PM EST ----- INR stable Await AFP, HCV RNA

## 2020-05-25 LAB — AFP TUMOR MARKER: AFP-Tumor Marker: 8.9 ng/mL — ABNORMAL HIGH (ref <6.1)

## 2020-05-26 ENCOUNTER — Other Ambulatory Visit: Payer: Self-pay

## 2020-05-26 DIAGNOSIS — K7030 Alcoholic cirrhosis of liver without ascites: Secondary | ICD-10-CM

## 2020-05-28 LAB — HCV RNA NAA QUAL RFX TO QUANT: HCV RNA NAA Qualitative: NEGATIVE

## 2020-06-13 ENCOUNTER — Other Ambulatory Visit (HOSPITAL_COMMUNITY): Payer: Self-pay | Admitting: Interventional Radiology

## 2020-06-13 DIAGNOSIS — C642 Malignant neoplasm of left kidney, except renal pelvis: Secondary | ICD-10-CM

## 2020-06-22 ENCOUNTER — Telehealth: Payer: Self-pay | Admitting: Gastroenterology

## 2020-06-22 ENCOUNTER — Encounter: Payer: Medicaid Other | Admitting: Gastroenterology

## 2020-06-22 ENCOUNTER — Other Ambulatory Visit: Payer: Self-pay

## 2020-06-27 ENCOUNTER — Other Ambulatory Visit: Payer: Self-pay

## 2020-06-27 ENCOUNTER — Encounter: Payer: Self-pay | Admitting: Gastroenterology

## 2020-06-27 ENCOUNTER — Ambulatory Visit (AMBULATORY_SURGERY_CENTER): Payer: Medicaid Other | Admitting: Gastroenterology

## 2020-06-27 VITALS — BP 120/78 | HR 78 | Temp 98.6°F | Resp 16 | Ht 68.0 in | Wt 245.0 lb

## 2020-06-27 DIAGNOSIS — K319 Disease of stomach and duodenum, unspecified: Secondary | ICD-10-CM

## 2020-06-27 DIAGNOSIS — K2100 Gastro-esophageal reflux disease with esophagitis, without bleeding: Secondary | ICD-10-CM | POA: Diagnosis not present

## 2020-06-27 DIAGNOSIS — K766 Portal hypertension: Secondary | ICD-10-CM

## 2020-06-27 DIAGNOSIS — I8500 Esophageal varices without bleeding: Secondary | ICD-10-CM

## 2020-06-27 DIAGNOSIS — I864 Gastric varices: Secondary | ICD-10-CM

## 2020-06-27 DIAGNOSIS — K222 Esophageal obstruction: Secondary | ICD-10-CM | POA: Diagnosis not present

## 2020-06-27 DIAGNOSIS — K449 Diaphragmatic hernia without obstruction or gangrene: Secondary | ICD-10-CM

## 2020-06-27 DIAGNOSIS — K7030 Alcoholic cirrhosis of liver without ascites: Secondary | ICD-10-CM

## 2020-06-27 MED ORDER — SODIUM CHLORIDE 0.9 % IV SOLN: 500.0000 mL | Freq: Once | INTRAVENOUS | Status: DC

## 2020-06-27 NOTE — Progress Notes (Signed)
Pt to RR with bite block in place- pushed bite block out per self.

## 2020-06-27 NOTE — Progress Notes (Signed)
pt tolerated well. VSS. awake and to recovery. Report given to RN. Bite block left insitu to recovery. 

## 2020-06-27 NOTE — Op Note (Signed)
[facility] Patient Name: John [name] Procedure Date: 06/27/2020 2:25 PM MRN: [id number] Endoscopist: Ladene Artist , MD Age: 65 Referring MD:  Date of Birth: 1955/09/01 Gender: Male Account #: [id number] Procedure:                Upper GI endoscopy Indications:              Follow-up of gastric varices, esosphageal varices Medicines:                Monitored Anesthesia Care Procedure:                Pre-Anesthesia Assessment:                           - Prior to the procedure, a History and Physical                            was performed, and patient medications and                            allergies were reviewed. The patient's tolerance of                            previous anesthesia was also reviewed. The risks                            and benefits of the procedure and the sedation                            options and risks were discussed with the patient.                            All questions were answered, and informed consent                            was obtained. Prior Anticoagulants: The patient has                            taken no previous anticoagulant or antiplatelet                            agents. ASA Grade Assessment: III - A patient with                            severe systemic disease. After reviewing the risks                            and benefits, the patient was deemed in                            satisfactory condition to undergo the procedure.                           After obtaining informed consent, the endoscope was  passed under direct vision. Throughout the                            procedure, the patient's blood pressure, pulse, and                            oxygen saturations were monitored continuously. The                            Endoscope was introduced through the mouth, and                            advanced to the second part of duodenum. The upper                            GI  endoscopy was accomplished without difficulty.                            The patient tolerated the procedure well. Scope In: Scope Out: Findings:                 Three columns of grade I varices with no bleeding                            and no stigmata of recent bleeding were found in                            the mid esophagus and in the distal esophagus,                            larger in the mid esophagus. They were 4 mm in                            largest diameter. No red wale signs were present.                           One benign-appearing, intrinsic mild stenosis was                            found at the gastroesophageal junction. This                            stenosis measured 1.4 cm (inner diameter) x less                            than one cm (in length). The stenosis was traversed.                           LA Grade A (one or more mucosal breaks less than 5                            mm, not extending between tops of 2 mucosal folds)  esophagitis with no bleeding was found at the                            gastroesophageal junction.                           The exam of the esophagus was otherwise normal.                           Moderate portal hypertensive gastropathy was found                            in the gastric fundus and in the gastric body.                           A varix with no bleeding was found in the gastric                            fundus. It had no stigmata of recent bleeding. It                            was 4 mm in diameter.                           A small hiatal hernia was present.                           The exam of the stomach was otherwise normal.                           The duodenal bulb and second portion of the                            duodenum were normal. Complications:            No immediate complications. Estimated Blood Loss:     Estimated blood loss: none. Impression:               - Grade  I esophageal varices with no bleeding and                            no stigmata of recent bleeding.                           - Benign-appearing esophageal stenosis.                           - LA Grade A reflux esophagitis with no bleeding.                           - Portal hypertensive gastropathy.                           - Gastric varices, without bleeding.                           -  Small hiatal hernia.                           - Normal duodenal bulb and second portion of the                            duodenum.                           - No specimens collected. Recommendation:           - Patient has a contact number available for                            emergencies. The signs and symptoms of potential                            delayed complications were discussed with the                            patient. Return to normal activities tomorrow.                            Written discharge instructions were provided to the                            patient.                           - Resume previous diet.                           - Follow antireflux measures long term.                           - Continue present medications, including                            omeprazole 40 mg po qd.                           - Repeat upper endoscopy in 1-2 years for                            surveillance.                           - Return to GI clinic in 4 months. Ladene Artist, MD 06/27/2020 2:48:58 PM This report has been signed electronically.

## 2020-06-27 NOTE — Patient Instructions (Signed)
Please continue your normal medications  Follow anti-reflux measures- see handout  Please call Dr. Lynne Leader office to set up follow up office visit for 4 months  1-2 year repeat upper endoscopy  YOU HAD AN ENDOSCOPIC PROCEDURE TODAY AT [place]:   Refer to the procedure report that was given to you for any specific questions about what was found during the examination.  If the procedure report does not answer your questions, please call your gastroenterologist to clarify.  If you requested that your care partner not be given the details of your procedure findings, then the procedure report has been included in a sealed envelope for you to review at your convenience later.  YOU SHOULD EXPECT: Some feelings of bloating in the abdomen. Passage of more gas than usual.  Walking can help get rid of the air that was put into your GI tract during the procedure and reduce the bloating. Please Note:  You might notice some irritation and congestion in your nose or some drainage.  This is from the oxygen used during your procedure.  There is no need for concern and it should clear up in a day or so.  SYMPTOMS TO REPORT IMMEDIATELY:   Following upper endoscopy (EGD)  Vomiting of blood or coffee ground material  New chest pain or pain under the shoulder blades  Painful or persistently difficult swallowing  New shortness of breath  Fever of 100F or higher  Black, tarry-looking stools  For urgent or emergent issues, a gastroenterologist can be reached at any hour by calling [phone]. Do not use MyChart messaging for urgent concerns.    DIET:  We do recommend a small meal at first, but then you may proceed to your regular diet.  Drink plenty of fluids but you should avoid alcoholic beverages for 24 hours.  ACTIVITY:  You should plan to take it easy for the rest of today and you should NOT DRIVE or use heavy machinery until tomorrow (because of the sedation medicines used during  the test).    FOLLOW UP: Our staff will call the number listed on your records 48-72 hours following your procedure to check on you and address any questions or concerns that you may have regarding the information given to you following your procedure. If we do not reach you, we will leave a message.  We will attempt to reach you two times.  During this call, we will ask if you have developed any symptoms of COVID 19. If you develop any symptoms (ie: fever, flu-like symptoms, shortness of breath, cough etc.) before then, please call [phone].  If you test positive for Covid 19 in the 2 weeks post procedure, please call and report this information to [place].    SIGNATURES/CONFIDENTIALITY: You and/or your care partner have signed paperwork which will be entered into your electronic medical record.  These signatures attest to the fact that that the information above on your After Visit Summary has been reviewed and is understood.  Full responsibility of the confidentiality of this discharge information lies with you and/or your care-partner.

## 2020-06-28 ENCOUNTER — Other Ambulatory Visit: Payer: Self-pay | Admitting: Physician Assistant

## 2020-06-28 NOTE — Patient Instructions (Signed)
DUE TO COVID-19 ONLY ONE VISITOR IS ALLOWED TO COME WITH YOU AND STAY IN THE WAITING ROOM ONLY DURING PRE OP AND PROCEDURE DAY OF SURGERY. THE 1 VISITOR  MAY VISIT WITH YOU AFTER SURGERY IN YOUR PRIVATE ROOM DURING VISITING HOURS ONLY!  YOU NEED TO HAVE A COVID 19 TEST ON__3/5_____ @_______ , THIS TEST MUST BE DONE BEFORE SURGERY,  COVID TESTING SITE [address] [place] [address], IT IS ON THE RIGHT GOING OUT [address] APPROXIMATELY  2 MINUTES PAST [employer] ON THE RIGHT. ONCE YOUR COVID TEST IS COMPLETED,  PLEASE BEGIN THE QUARANTINE INSTRUCTIONS AS OUTLINED IN YOUR HANDOUT.                John [name]    Your procedure is scheduled on: 07/05/20   Report to [facility] Main  Entrance   Report to admitting at  7:00 AM     Call this number if you have problems the morning of surgery [phone]    Remember: Do not eat food or drink liquids :After Midnight.   BRUSH YOUR TEETH MORNING OF SURGERY AND RINSE YOUR MOUTH OUT, NO CHEWING GUM CANDY OR MINTS.     Take these medicines the morning of surgery with A SIP OF WATER: Hydralazine, Amlodipine, Allopurinol, Omeprazole                                 You may not have any metal on your body including              piercings  Do not wear jewelry, lotions, powders or deodorant                 Men may shave face and neck.   Do not bring valuables to the hospital. [place].  Contacts, dentures or bridgework may not be worn into surgery.      Special Instructions: N/A              Please read over the following fact sheets you were given: _____________________________________________________________________             [facility] - Preparing for Surgery Before surgery, you can play an important role.  Because skin is not sterile, your skin needs to be as free of germs as possible.  You can reduce the number of germs on your skin by washing  with CHG (chlorahexidine gluconate) soap before surgery.  CHG is an antiseptic cleaner which kills germs and bonds with the skin to continue killing germs even after washing. Please DO NOT use if you have an allergy to CHG or antibacterial soaps.  If your skin becomes reddened/irritated stop using the CHG and inform your nurse when you arrive at Short Stay.  You may shave your face/neck.  Please follow these instructions carefully:  1.  Shower with CHG Soap the night before surgery and the  morning of Surgery.  2.  If you choose to wash your hair, wash your hair first as usual with your  normal  shampoo.  3.  After you shampoo, rinse your hair and body thoroughly to remove the  shampoo.  4.  Use CHG as you would any other liquid soap.  You can apply chg directly  to the skin and wash                       Gently with a scrungie or clean washcloth.  5.  Apply the CHG Soap to your body ONLY FROM THE NECK DOWN.   Do not use on face/ open                           Wound or open sores. Avoid contact with eyes, ears mouth and genitals (private parts).                       Wash face,  Genitals (private parts) with your normal soap.             6.  Wash thoroughly, paying special attention to the area where your surgery  will be performed.  7.  Thoroughly rinse your body with warm water from the neck down.  8.  DO NOT shower/wash with your normal soap after using and rinsing off  the CHG Soap.             9.  Pat yourself dry with a clean towel.            10.  Wear clean pajamas.            11.  Place clean sheets on your bed the night of your first shower and do not  sleep with pets. Day of Surgery : Do not apply any lotions/deodorants the morning of surgery.  Please wear clean clothes to the hospital/surgery center.  FAILURE TO FOLLOW THESE INSTRUCTIONS MAY RESULT IN THE CANCELLATION OF YOUR SURGERY PATIENT SIGNATURE_________________________________  NURSE  SIGNATURE__________________________________  ________________________________________________________________________

## 2020-06-29 ENCOUNTER — Other Ambulatory Visit: Payer: Self-pay

## 2020-06-29 ENCOUNTER — Telehealth: Payer: Self-pay

## 2020-06-29 ENCOUNTER — Encounter (HOSPITAL_COMMUNITY): Payer: Self-pay

## 2020-06-29 ENCOUNTER — Encounter (HOSPITAL_COMMUNITY)
Admission: RE | Admit: 2020-06-29 | Discharge: 2020-06-29 | Disposition: A | Discharge status: Home / Self Care | Payer: Medicaid Other | Source: Ambulatory Visit | Attending: Interventional Radiology | Admitting: Interventional Radiology

## 2020-06-29 NOTE — Telephone Encounter (Signed)
  Follow up Call-  Call back number 06/27/2020 03/02/2018  Post procedure Call Back phone  # [phone]  Permission to leave phone message Yes Yes  Some recent data might be hidden     Patient questions:  Do you have a fever, pain , or abdominal swelling? No. Pain Score  0 *  Have you tolerated food without any problems? Yes.    Have you been able to return to your normal activities? Yes.    Do you have any questions about your discharge instructions: Diet   No. Medications  No. Follow up visit  No.  Do you have questions or concerns about your Care? No.  Actions: * If pain score is 4 or above: No action needed, pain <4.  1. Have you developed a fever since your procedure? no  2.   Have you had an respiratory symptoms (SOB or cough) since your procedure? no  3.   Have you tested positive for COVID 19 since your procedure no  4.   Have you had any family members/close contacts diagnosed with the COVID 19 since your procedure?  no   If yes to any of these questions please route to Joylene John, RN and Joella Prince, RN

## 2020-06-29 NOTE — Progress Notes (Addendum)
Pt did not show up for his PAT visit this morning. I called his contact numbers and gave a message to a family member. The Pt called me back for a phone interview but we were not able to finish it . He requested that we finish the call at 3:00 pm because it was so loud where he was.   I talked with the Pt at 3:25 pm. He was upset about the covid test. I called short stay about a rapid test for DOS but he would have to get to the hospital at 5:30 AM. [facility] uses [facility] and they  are not able to bring him at that time. Tiffany in IR will reach out to the Pt's sister about bringing him to the hospital on 07/05/20 at 5:30 AM

## 2020-06-30 ENCOUNTER — Other Ambulatory Visit (HOSPITAL_COMMUNITY): Payer: Medicaid Other

## 2020-06-30 NOTE — Progress Notes (Signed)
I spoke with John [name] this morning. He will arrive at 5:30 AM on 07/05/20 and go to admitting. I told him no food or drink after midnight . He can take his Amlodipine and hydralazine with a sip of water. He was instructed to take a shower with Dial soap. He expressed his understanding. Short stay was notified.

## 2020-07-04 ENCOUNTER — Telehealth: Payer: Self-pay | Admitting: *Deleted

## 2020-07-04 ENCOUNTER — Encounter (HOSPITAL_COMMUNITY): Payer: Self-pay | Admitting: Interventional Radiology

## 2020-07-04 ENCOUNTER — Other Ambulatory Visit: Payer: Self-pay | Admitting: Radiology

## 2020-07-04 NOTE — Anesthesia Preprocedure Evaluation (Signed)
Anesthesia Evaluation  Patient identified by MRN, date of birth, ID band Patient awake    Reviewed: Allergy & Precautions, NPO status , Patient's Chart, lab work & pertinent test results  History of Anesthesia Complications Negative for: history of anesthetic complications  Airway Mallampati: I  TM Distance: >3 FB Neck ROM: Full    Dental  (+) Edentulous Upper, Edentulous Lower   Pulmonary Patient abstained from smoking., former smoker,  07/05/2020 SARS coronavirus NEG   breath sounds clear to auscultation       Cardiovascular hypertension, Pt. on medications (-) angina Rhythm:Regular Rate:Normal  '17 ECHO: EF 70-75%, no significant valvular abnormalities   Neuro/Psych Chronic back pain CVA, No Residual Symptoms    GI/Hepatic GERD  Medicated and Controlled,(+) Cirrhosis   ascites  substance abuse  alcohol use, cocaine use and marijuana use, Hepatitis -  Endo/Other  Morbid obesity  Renal/GU Renal InsufficiencyRenal diseaseRenal cell cancer     Musculoskeletal  (+) Arthritis ,   Abdominal (+) + obese,   Peds  Hematology negative hematology ROS (+)   Anesthesia Other Findings   Reproductive/Obstetrics                            Anesthesia Physical Anesthesia Plan  ASA: III  Anesthesia Plan: General   Post-op Pain Management:    Induction: Intravenous  PONV Risk Score and Plan: 2 and Ondansetron and Dexamethasone  Airway Management Planned: Oral ETT  Additional Equipment: None  Intra-op Plan:   Post-operative Plan: Extubation in OR  Informed Consent: I have reviewed the patients History and Physical, chart, labs and discussed the procedure including the risks, benefits and alternatives for the proposed anesthesia with the patient or authorized representative who has indicated his/her understanding and acceptance.     Dental advisory given  Plan Discussed with: CRNA and  Surgeon  Anesthesia Plan Comments:        Anesthesia Quick Evaluation

## 2020-07-04 NOTE — Telephone Encounter (Signed)
Patient is wanting to know about his upcoming surgery(03/17), needing details. Please call.

## 2020-07-05 ENCOUNTER — Ambulatory Visit (HOSPITAL_COMMUNITY): Payer: Medicaid Other

## 2020-07-05 ENCOUNTER — Ambulatory Visit (HOSPITAL_COMMUNITY): Payer: Medicaid Other | Admitting: Physician Assistant

## 2020-07-05 ENCOUNTER — Ambulatory Visit (HOSPITAL_COMMUNITY)
Admission: RE | Admit: 2020-07-05 | Discharge: 2020-07-05 | Disposition: A | Discharge status: Home / Self Care | Payer: Medicaid Other | Source: Ambulatory Visit | Attending: Interventional Radiology | Admitting: Interventional Radiology

## 2020-07-05 ENCOUNTER — Observation Stay (HOSPITAL_COMMUNITY)
Admission: RE | Admit: 2020-07-05 | Discharge: 2020-07-06 | Disposition: A | Discharge status: Home / Self Care | Payer: Medicaid Other | Attending: Interventional Radiology | Admitting: Interventional Radiology

## 2020-07-05 ENCOUNTER — Encounter (HOSPITAL_COMMUNITY)
Admission: RE | Disposition: A | Discharge status: Home / Self Care | Payer: Self-pay | Source: Home / Self Care | Attending: Interventional Radiology

## 2020-07-05 ENCOUNTER — Encounter (HOSPITAL_COMMUNITY): Payer: Self-pay

## 2020-07-05 ENCOUNTER — Encounter (HOSPITAL_COMMUNITY): Payer: Self-pay | Admitting: Interventional Radiology

## 2020-07-05 ENCOUNTER — Other Ambulatory Visit: Payer: Self-pay

## 2020-07-05 DIAGNOSIS — C642 Malignant neoplasm of left kidney, except renal pelvis: Secondary | ICD-10-CM | POA: Diagnosis not present

## 2020-07-05 DIAGNOSIS — N2889 Other specified disorders of kidney and ureter: Secondary | ICD-10-CM | POA: Diagnosis present

## 2020-07-05 DIAGNOSIS — Z20822 Contact with and (suspected) exposure to covid-19: Secondary | ICD-10-CM | POA: Diagnosis not present

## 2020-07-05 DIAGNOSIS — I130 Hypertensive heart and chronic kidney disease with heart failure and stage 1 through stage 4 chronic kidney disease, or unspecified chronic kidney disease: Secondary | ICD-10-CM | POA: Diagnosis not present

## 2020-07-05 DIAGNOSIS — I509 Heart failure, unspecified: Secondary | ICD-10-CM | POA: Insufficient documentation

## 2020-07-05 DIAGNOSIS — F1910 Other psychoactive substance abuse, uncomplicated: Secondary | ICD-10-CM | POA: Diagnosis not present

## 2020-07-05 DIAGNOSIS — Z79899 Other long term (current) drug therapy: Secondary | ICD-10-CM | POA: Insufficient documentation

## 2020-07-05 DIAGNOSIS — Z01818 Encounter for other preprocedural examination: Secondary | ICD-10-CM

## 2020-07-05 DIAGNOSIS — N189 Chronic kidney disease, unspecified: Secondary | ICD-10-CM | POA: Insufficient documentation

## 2020-07-05 DIAGNOSIS — Z85528 Personal history of other malignant neoplasm of kidney: Secondary | ICD-10-CM

## 2020-07-05 HISTORY — PX: RADIOLOGY WITH ANESTHESIA: SHX6223

## 2020-07-05 LAB — COMPREHENSIVE METABOLIC PANEL
ALT: 32 U/L (ref 0–44)
AST: 45 U/L — ABNORMAL HIGH (ref 15–41)
Albumin: 4.1 g/dL (ref 3.5–5.0)
Alkaline Phosphatase: 104 U/L (ref 38–126)
Anion gap: 12 (ref 5–15)
BUN: 14 mg/dL (ref 8–23)
CO2: 22 mmol/L (ref 22–32)
Calcium: 9.2 mg/dL (ref 8.9–10.3)
Chloride: 99 mmol/L (ref 98–111)
Creatinine, Ser: 1.74 mg/dL — ABNORMAL HIGH (ref 0.61–1.24)
GFR, Estimated: 44 mL/min — ABNORMAL LOW (ref >60)
Glucose, Bld: 86 mg/dL (ref 70–99)
Potassium: 3.6 mmol/L (ref 3.5–5.1)
Sodium: 133 mmol/L — ABNORMAL LOW (ref 135–145)
Total Bilirubin: 1.1 mg/dL (ref 0.3–1.2)
Total Protein: 7.3 g/dL (ref 6.5–8.1)

## 2020-07-05 LAB — CBC WITH DIFFERENTIAL/PLATELET
Abs Immature Granulocytes: 0.02 10*3/uL (ref 0.00–0.07)
Basophils Absolute: 0 10*3/uL (ref 0.0–0.1)
Basophils Relative: 1 %
Eosinophils Absolute: 0.1 10*3/uL (ref 0.0–0.5)
Eosinophils Relative: 1 %
HCT: 37.3 % — ABNORMAL LOW (ref 39.0–52.0)
Hemoglobin: 12.3 g/dL — ABNORMAL LOW (ref 13.0–17.0)
Immature Granulocytes: 0 %
Lymphocytes Relative: 26 %
Lymphs Abs: 1.6 10*3/uL (ref 0.7–4.0)
MCH: 26.9 pg (ref 26.0–34.0)
MCHC: 33 g/dL (ref 30.0–36.0)
MCV: 81.4 fL (ref 80.0–100.0)
Monocytes Absolute: 1 10*3/uL (ref 0.1–1.0)
Monocytes Relative: 16 %
Neutro Abs: 3.3 10*3/uL (ref 1.7–7.7)
Neutrophils Relative %: 56 %
Platelets: 108 10*3/uL — ABNORMAL LOW (ref 150–400)
RBC: 4.58 MIL/uL (ref 4.22–5.81)
RDW: 16.4 % — ABNORMAL HIGH (ref 11.5–15.5)
WBC: 6 10*3/uL (ref 4.0–10.5)
nRBC: 0 % (ref 0.0–0.2)

## 2020-07-05 LAB — PROTIME-INR
INR: 1.1 (ref 0.8–1.2)
Prothrombin Time: 13.7 seconds (ref 11.4–15.2)

## 2020-07-05 LAB — TYPE AND SCREEN
ABO/RH(D): O POS
Antibody Screen: NEGATIVE

## 2020-07-05 LAB — SARS CORONAVIRUS 2 BY RT PCR (HOSPITAL ORDER, PERFORMED IN ~~LOC~~ HOSPITAL LAB): SARS Coronavirus 2: NEGATIVE

## 2020-07-05 SURGERY — MRI WITH ANESTHESIA
Anesthesia: General | Laterality: Left

## 2020-07-05 MED ORDER — MIDAZOLAM HCL 2 MG/2ML IJ SOLN
INTRAMUSCULAR | Status: AC
  Filled 2020-07-05: qty 2

## 2020-07-05 MED ORDER — LACTULOSE 10 GM/15ML PO SOLN: 10.0000 g | Freq: Every day | ORAL | Status: DC | PRN

## 2020-07-05 MED ORDER — CHLORHEXIDINE GLUCONATE 0.12 % MT SOLN
15.0000 mL | Freq: Once | OROMUCOSAL | Status: AC
  Administered 2020-07-05: 15 mL via OROMUCOSAL

## 2020-07-05 MED ORDER — ONDANSETRON HCL 4 MG/2ML IJ SOLN
INTRAMUSCULAR | Status: DC | PRN
  Administered 2020-07-05: 4 mg via INTRAVENOUS

## 2020-07-05 MED ORDER — FENTANYL CITRATE (PF) 100 MCG/2ML IJ SOLN
25.0000 ug | INTRAMUSCULAR | Status: DC | PRN
  Administered 2020-07-05 (×3): 25 ug via INTRAVENOUS

## 2020-07-05 MED ORDER — COLCHICINE 0.6 MG PO TABS
0.6000 mg | ORAL_TABLET | Freq: Every day | ORAL | Status: DC
  Administered 2020-07-05 – 2020-07-06 (×2): 0.6 mg via ORAL
  Filled 2020-07-05 (×2): qty 1

## 2020-07-05 MED ORDER — MELOXICAM 15 MG PO TABS
15.0000 mg | ORAL_TABLET | Freq: Every day | ORAL | Status: DC
  Administered 2020-07-05: 15 mg via ORAL
  Filled 2020-07-05 (×2): qty 1

## 2020-07-05 MED ORDER — SODIUM CHLORIDE 0.9 % IV SOLN: INTRAVENOUS | Status: DC

## 2020-07-05 MED ORDER — BUDESONIDE 0.25 MG/2ML IN SUSP
0.2500 mg | Freq: Two times a day (BID) | RESPIRATORY_TRACT | Status: DC
  Administered 2020-07-06: 0.25 mg via RESPIRATORY_TRACT
  Filled 2020-07-05 (×2): qty 2

## 2020-07-05 MED ORDER — MIDAZOLAM HCL 2 MG/2ML IJ SOLN: 0.5000 mg | Freq: Once | INTRAMUSCULAR | Status: DC | PRN

## 2020-07-05 MED ORDER — SODIUM CHLORIDE 0.9 % IV SOLN
INTRAVENOUS | Status: AC
  Filled 2020-07-05: qty 250

## 2020-07-05 MED ORDER — LACTATED RINGERS IV SOLN: INTRAVENOUS | Status: DC

## 2020-07-05 MED ORDER — FOLIC ACID 1 MG PO TABS
1.0000 mg | ORAL_TABLET | Freq: Every day | ORAL | Status: DC
  Administered 2020-07-05 – 2020-07-06 (×2): 1 mg via ORAL
  Filled 2020-07-05 (×2): qty 1

## 2020-07-05 MED ORDER — FLUTICASONE PROPIONATE 50 MCG/ACT NA SUSP: 1.0000 | Freq: Every day | NASAL | Status: DC | PRN

## 2020-07-05 MED ORDER — ALBUTEROL SULFATE HFA 108 (90 BASE) MCG/ACT IN AERS: 1.0000 | INHALATION_SPRAY | Freq: Four times a day (QID) | RESPIRATORY_TRACT | Status: DC | PRN

## 2020-07-05 MED ORDER — FENTANYL CITRATE (PF) 100 MCG/2ML IJ SOLN
INTRAMUSCULAR | Status: AC
  Administered 2020-07-05: 50 ug via INTRAVENOUS
  Filled 2020-07-05: qty 2

## 2020-07-05 MED ORDER — OXYCODONE HCL 5 MG/5ML PO SOLN
5.0000 mg | Freq: Once | ORAL | Status: AC | PRN
Start: 2020-07-05 — End: 2020-07-05

## 2020-07-05 MED ORDER — MEPERIDINE HCL 50 MG/ML IJ SOLN: 6.2500 mg | INTRAMUSCULAR | Status: DC | PRN

## 2020-07-05 MED ORDER — DEXAMETHASONE SODIUM PHOSPHATE 10 MG/ML IJ SOLN
INTRAMUSCULAR | Status: DC | PRN
  Administered 2020-07-05: 8 mg via INTRAVENOUS

## 2020-07-05 MED ORDER — PROMETHAZINE HCL 25 MG/ML IJ SOLN
6.2500 mg | INTRAMUSCULAR | Status: DC | PRN
Start: 2020-07-05 — End: 2020-07-05

## 2020-07-05 MED ORDER — PANTOPRAZOLE SODIUM 40 MG PO TBEC
40.0000 mg | DELAYED_RELEASE_TABLET | Freq: Every day | ORAL | Status: DC
  Administered 2020-07-05 – 2020-07-06 (×2): 40 mg via ORAL
  Filled 2020-07-05 (×2): qty 1

## 2020-07-05 MED ORDER — OXYCODONE HCL 5 MG PO TABS
ORAL_TABLET | ORAL | Status: AC
  Filled 2020-07-05: qty 1

## 2020-07-05 MED ORDER — HYDROCODONE-ACETAMINOPHEN 5-325 MG PO TABS
1.0000 | ORAL_TABLET | ORAL | Status: DC | PRN
  Administered 2020-07-05 – 2020-07-06 (×2): 2 via ORAL
  Filled 2020-07-05 (×2): qty 2

## 2020-07-05 MED ORDER — FENTANYL CITRATE (PF) 100 MCG/2ML IJ SOLN
INTRAMUSCULAR | Status: AC
  Administered 2020-07-05: 25 ug via INTRAVENOUS
  Filled 2020-07-05: qty 2

## 2020-07-05 MED ORDER — POTASSIUM CHLORIDE IN NACL 20-0.9 MEQ/L-% IV SOLN
INTRAVENOUS | Status: DC
  Filled 2020-07-05: qty 1000

## 2020-07-05 MED ORDER — PROPOFOL 10 MG/ML IV BOLUS
INTRAVENOUS | Status: DC | PRN
  Administered 2020-07-05: 200 mg via INTRAVENOUS

## 2020-07-05 MED ORDER — SUGAMMADEX SODIUM 500 MG/5ML IV SOLN
INTRAVENOUS | Status: DC | PRN
  Administered 2020-07-05: 213.2 mg via INTRAVENOUS

## 2020-07-05 MED ORDER — ROCURONIUM BROMIDE 10 MG/ML (PF) SYRINGE
PREFILLED_SYRINGE | INTRAVENOUS | Status: DC | PRN
  Administered 2020-07-05: 20 mg via INTRAVENOUS
  Administered 2020-07-05: 60 mg via INTRAVENOUS

## 2020-07-05 MED ORDER — AMLODIPINE BESYLATE 10 MG PO TABS
10.0000 mg | ORAL_TABLET | Freq: Every day | ORAL | Status: DC
  Administered 2020-07-05 – 2020-07-06 (×2): 10 mg via ORAL
  Filled 2020-07-05 (×2): qty 1

## 2020-07-05 MED ORDER — FENTANYL CITRATE (PF) 100 MCG/2ML IJ SOLN
INTRAMUSCULAR | Status: DC | PRN
  Administered 2020-07-05 (×3): 50 ug via INTRAVENOUS

## 2020-07-05 MED ORDER — OXYCODONE HCL 5 MG PO TABS
5.0000 mg | ORAL_TABLET | Freq: Once | ORAL | Status: AC | PRN
  Administered 2020-07-05: 5 mg via ORAL

## 2020-07-05 MED ORDER — FENTANYL CITRATE (PF) 250 MCG/5ML IJ SOLN
INTRAMUSCULAR | Status: AC
  Filled 2020-07-05: qty 5

## 2020-07-05 MED ORDER — ATORVASTATIN CALCIUM 20 MG PO TABS
20.0000 mg | ORAL_TABLET | Freq: Every day | ORAL | Status: DC
  Administered 2020-07-05 – 2020-07-06 (×2): 20 mg via ORAL
  Filled 2020-07-05 (×2): qty 1

## 2020-07-05 MED ORDER — FUROSEMIDE 40 MG PO TABS
40.0000 mg | ORAL_TABLET | Freq: Every day | ORAL | Status: DC
  Administered 2020-07-05 – 2020-07-06 (×2): 40 mg via ORAL
  Filled 2020-07-05 (×2): qty 1

## 2020-07-05 MED ORDER — SPIRONOLACTONE 25 MG PO TABS
25.0000 mg | ORAL_TABLET | Freq: Every day | ORAL | Status: DC | PRN
  Filled 2020-07-05: qty 1

## 2020-07-05 MED ORDER — HYDRALAZINE HCL 50 MG PO TABS
50.0000 mg | ORAL_TABLET | Freq: Three times a day (TID) | ORAL | Status: DC
  Administered 2020-07-05 – 2020-07-06 (×3): 50 mg via ORAL
  Filled 2020-07-05 (×3): qty 1

## 2020-07-05 MED ORDER — LIDOCAINE 2% (20 MG/ML) 5 ML SYRINGE
INTRAMUSCULAR | Status: DC | PRN
  Administered 2020-07-05: 40 mg via INTRAVENOUS

## 2020-07-05 MED ORDER — ONDANSETRON HCL 4 MG/2ML IJ SOLN: 4.0000 mg | Freq: Four times a day (QID) | INTRAMUSCULAR | Status: DC | PRN

## 2020-07-05 MED ORDER — MIDAZOLAM HCL 5 MG/5ML IJ SOLN
INTRAMUSCULAR | Status: DC | PRN
  Administered 2020-07-05: 2 mg via INTRAVENOUS

## 2020-07-05 MED ORDER — ORAL CARE MOUTH RINSE: 15.0000 mL | Freq: Once | OROMUCOSAL | Status: AC

## 2020-07-05 MED ORDER — ALLOPURINOL 100 MG PO TABS
100.0000 mg | ORAL_TABLET | Freq: Every day | ORAL | Status: DC
  Administered 2020-07-05 – 2020-07-06 (×2): 100 mg via ORAL
  Filled 2020-07-05 (×2): qty 1

## 2020-07-05 NOTE — Procedures (Signed)
Patient ID: John [name], male   DOB: Oct 24, 1955, 65 y.o.   MRN: [id number] Interventional Radiology Procedure Note  Procedure: CT LEFT RENAL MASS CRYO    Complications: None  Estimated Blood Loss:  0  Findings: SUCCCESSFUL LEFT RENAL UPPER POLE MASS CRYO FULL REPORT IN PACS     Tamera Punt, MD

## 2020-07-05 NOTE — Progress Notes (Signed)
Patient ID: John [name], male   DOB: 02-Apr-1956, 65 y.o.   MRN: [id number] Patient doing fairly well; continues to have some mild to moderate soreness in left flank region; denies fever, respiratory issues, nausea, vomiting; yellow urine in Foley bag BUN 132/88, afebrile, heart rate 73; puncture site left flank clean, dry, soft, mild mildly tender to palpation.  A/P: Patient with history of left renal cell carcinoma with prior cryoablation in 2018; now with local recurrence at previous ablation site; status post CT-guided cryoablation of region earlier today; for overnight obs; check a.m. labs; Norco for pain; DC Foley catheter later today; follow-up with Dr. Annamaria Boots in [place] clinic in 1 month

## 2020-07-05 NOTE — Sedation Documentation (Signed)
Anesthesia at bedside to sedate and monitor. 

## 2020-07-05 NOTE — Transfer of Care (Signed)
Immediate Anesthesia Transfer of Care Note  Patient: John [name]  Procedure(s) Performed: CT CRYOABLATION (Left )  Patient Location: PACU  Anesthesia Type:General  Level of Consciousness: drowsy and patient cooperative  Airway & Oxygen Therapy: Patient Spontanous Breathing and Patient connected to face mask oxygen  Post-op Assessment: Report given to RN and Post -op Vital signs reviewed and stable  Post vital signs: Reviewed and stable  Last Vitals:  Vitals Value Taken Time  BP 137/89 07/05/20 1100  Temp    Pulse 73 07/05/20 1100  Resp 18 07/05/20 1100  SpO2 99 % 07/05/20 1100  Vitals shown include unvalidated device data.  Last Pain:  Vitals:   07/05/20 0746  TempSrc:   PainSc: 0-No pain      Patients Stated Pain Goal: 3 (56/31/49 7026)  Complications: No complications documented.

## 2020-07-05 NOTE — H&P (Signed)
Referring Physician(s): Ottelin,M  Supervising Physician: Daryll Brod  Patient Status:  [facility] OP TBA  Chief Complaint:  Recurrent left renal cell carcinoma  Subjective: Patient familiar to IR service from paracenteses x5 in 2016, x1 2017, BRTO of gastric varices in 2016, random liver biopsy in 2017 and cryoablation of a left lateral 1.4 cm renal cell carcinoma in 2018.  He has a history of hepatitis C, cirrhosis, portal hypertension, chronic renal insufficiency, prior stroke in 1998, sickle cell trait, polysubstance abuse, hypertension, GERD, and CHF.  Follow-up MRI of the abdomen on 05/09/2020 revealed left upper pole renal ablation defect with an adjacent area measuring up to 1.7 cm with enhancement concerning for local regional recurrence.  There was no other adenopathy or evidence of metastatic disease.  Following discussions with Dr. Annamaria Boots patient presents today for CT-guided repeat cryoablation of the left renal cell carcinoma recurrence.  He currently denies fever, headache, chest pain, dyspnea, cough, abdominal pain, nausea, vomiting or bleeding.  He does use a walker to assist with ambulation secondary to chronic back issues.  He continues to smoke.  Past Medical History:  Diagnosis Date  . Allergy   . Arthritis   . Ascites   . Cataract   . CHF (congestive heart failure) ([place])   . Chronic back pain   . Chronic kidney disease   . Cirrhosis ([place])   . Dyspnea    walking   . ETOH abuse   . GERD (gastroesophageal reflux disease)   . Hepatitis   . History of blood transfusion   . History of bronchitis   . Hypertension   . Neuromuscular disorder ([place])   . Optic neuropathy, left   . Polysubstance abuse ([place])   . Seizures [facility])    age 65  . Sickle cell trait ([place])   . Stroke [facility]) 1998   Past Surgical History:  Procedure Laterality Date  . COLONOSCOPY    . ESOPHAGOGASTRODUODENOSCOPY N/A 08/14/2014   Procedure: ESOPHAGOGASTRODUODENOSCOPY (EGD);  Surgeon: Ladene Artist,  MD;  Location: [facility];  Service: Endoscopy;  Laterality: N/A;  . ESOPHAGOGASTRODUODENOSCOPY (EGD) WITH PROPOFOL N/A 06/03/2017   Procedure: ESOPHAGOGASTRODUODENOSCOPY (EGD) WITH PROPOFOL;  Surgeon: Ladene Artist, MD;  Location: [facility];  Service: Endoscopy;  Laterality: N/A;  . IR RADIOLOGIST EVAL & MGMT  09/25/2016  . IR RADIOLOGIST EVAL & MGMT  12/10/2016  . IR RADIOLOGIST EVAL & MGMT  03/25/2018  . IR RADIOLOGIST EVAL & MGMT  03/24/2019  . IR RADIOLOGIST EVAL & MGMT  05/17/2020  . none    . RADIOFREQUENCY ABLATION Left 11/08/2016   Procedure: LEFT RENAL CRYOABLATION;  Surgeon: Greggory Keen, MD;  Location: [facility];  Service: Anesthesiology;  Laterality: Left;  . RADIOLOGY WITH ANESTHESIA N/A 08/15/2014   Procedure: RADIOLOGY WITH ANESTHESIA;  Surgeon: Greggory Keen, MD;  Location: [place];  Service: Radiology;  Laterality: N/A;  . UPPER GASTROINTESTINAL ENDOSCOPY        Allergies: Penicillins  Medications: Prior to Admission medications   Medication Sig Start Date End Date Taking? Authorizing Provider  allopurinol (ZYLOPRIM) 100 MG tablet Take 1 tablet (100 mg total) by mouth daily. 05/12/18  Yes Eugenie Filler, MD  amLODipine (NORVASC) 10 MG tablet Take 1 tablet (10 mg total) by mouth daily. 05/25/15  Yes Debbe Odea, MD  atorvastatin (LIPITOR) 20 MG tablet Take 20 mg by mouth daily. 04/29/19  Yes [provider]  colchicine 0.6 MG tablet Take 1 tablet (0.6 mg total) by mouth daily.  05/07/18  Yes Eugenie Filler, MD  FLOVENT HFA 220 MCG/ACT inhaler INHALE TWO PUFFS BY MOUTH INTO THE LUNGS 2 (TWO) TIMES DAILY. 01/27/19  Yes [provider]  fluticasone (FLONASE) 50 MCG/ACT nasal spray Use 1 squirt to each nostril daily as needed for sinus symptoms 01/27/19  Yes [provider]  folic acid (FOLVITE) 1 MG tablet Take 1 tablet (1 mg total) by mouth daily. 05/07/18  Yes Eugenie Filler, MD  furosemide (LASIX) 40 MG tablet Take 40 mg by mouth daily.    Yes [provider]  hydrALAZINE (APRESOLINE) 50 MG tablet Take 1 tablet (50 mg total) by mouth 3 (three) times daily. Patient taking differently: Take 50 mg by mouth in the morning and at bedtime. 05/25/15  Yes Debbe Odea, MD  lactulose (CHRONULAC) 10 GM/15ML solution Take 10 g by mouth daily as needed for mild constipation or moderate constipation.  11/01/16  Yes [provider]  meloxicam (MOBIC) 15 MG tablet Take 15 mg by mouth daily. 01/21/19  Yes [provider]  PROAIR HFA 108 (90 Base) MCG/ACT inhaler Inhale 1-2 puffs into the lungs every 6 (six) hours as needed for wheezing or shortness of breath.  10/02/17  Yes [provider]  spironolactone (ALDACTONE) 25 MG tablet Take 25 mg by mouth daily as needed. 06/13/18  Yes [provider]  BD VEO INSULIN SYR U/F 1/2UNIT 31G X 15/64" 0.3 ML MISC See admin instructions. Patient not taking: Reported on 06/27/2020 09/22/18   [provider]  omeprazole (PRILOSEC) 40 MG capsule TAKE ONE CAPSULE BY MOUTH DAILY 07/02/18   Ladene Artist, MD     Vital Signs: BP 117/79   Pulse 60   Temp 97.8 F (36.6 C) (Oral)   Resp 18   Ht 5\' 8"  (1.727 m)   Wt 235 lb (106.6 kg)   SpO2 97%   BMI 35.73 kg/m   Physical Exam awake, alert.  Chest with distant but clear breath sounds bilaterally.  Heart with regular rate and rhythm.  Abdomen protuberant, positive bowel sounds, nontender.  Trace pretibial edema bilaterally.  Imaging: No results found.  Labs:  CBC: No results for input(s): WBC, HGB, HCT, PLT in the last 8760 hours.  COAGS: Recent Labs    05/24/20 1150 07/05/20 0700  INR 1.1* 1.1    BMP: Recent Labs    07/05/20 0700  NA 133*  K 3.6  CL 99  CO2 22  GLUCOSE 86  BUN 14  CALCIUM 9.2  CREATININE 1.74*  GFRNONAA 44*    LIVER FUNCTION TESTS: Recent Labs    07/05/20 0700  BILITOT 1.1  AST 45*  ALT 32  ALKPHOS 104  PROT 7.3  ALBUMIN 4.1    Assessment and Plan: Patient  familiar to IR service from paracenteses x5 in 2016, x1 2017, BRTO of gastric varices in 2016, random liver biopsy in 2017 and cryoablation of a left lateral 1.4 cm renal cell carcinoma in 2018.  He has a history of hepatitis C, cirrhosis, portal hypertension, chronic renal insufficiency, prior stroke in 1998, sickle cell trait, polysubstance abuse, hypertension, GERD, and CHF.  Follow-up MRI of the abdomen on 05/09/2020 revealed left upper pole renal ablation defect with an adjacent area measuring up to 1.7 cm with enhancement concerning for local regional recurrence.  There was no other adenopathy or evidence of metastatic disease.  Following discussions with Dr. Annamaria Boots patient presents today for CT-guided repeat cryoablation of the left renal cell carcinoma recurrence.  Details/risks of procedure, including but not limited to, internal bleeding, infection, injury to adjacent structures, anesthesia related complications discussed with patient with his understanding and consent.  Post procedure he will be admitted for overnight observation.   Electronically Signed: D. Rowe Robert, PA-C 07/05/2020, 8:05 AM   I spent a total of 30 minutes at the the patient's bedside AND on the patient's hospital floor or unit, greater than 50% of which was counseling/coordinating care for image guided cryoablation of left renal cell carcinoma recurrence

## 2020-07-05 NOTE — Anesthesia Procedure Notes (Signed)
Procedure Name: Intubation Date/Time: 07/05/2020 8:56 AM Performed by: Lavina Hamman, CRNA Pre-anesthesia Checklist: Patient identified, Emergency Drugs available, Suction available, Patient being monitored and Timeout performed Patient Re-evaluated:Patient Re-evaluated prior to induction Oxygen Delivery Method: Circle system utilized Preoxygenation: Pre-oxygenation with 100% oxygen Induction Type: IV induction Ventilation: Mask ventilation without difficulty Laryngoscope Size: Mac and 4 Grade View: Grade I Tube type: Oral Tube size: 7.5 mm Number of attempts: 1 Airway Equipment and Method: Stylet Placement Confirmation: ETT inserted through vocal cords under direct vision,  positive ETCO2,  CO2 detector and breath sounds checked- equal and bilateral Secured at: 23 cm Tube secured with: Tape Dental Injury: Teeth and Oropharynx as per pre-operative assessment  Comments: ATOI

## 2020-07-05 NOTE — Anesthesia Postprocedure Evaluation (Signed)
Anesthesia Post Note  Patient: John [name]  Procedure(s) Performed: CT CRYOABLATION (Left )     Patient location during evaluation: PACU Anesthesia Type: General Level of consciousness: patient cooperative, oriented and sedated Pain management: pain level controlled (pain much improved) Vital Signs Assessment: post-procedure vital signs reviewed and stable Respiratory status: spontaneous breathing, nonlabored ventilation, respiratory function stable and patient connected to nasal cannula oxygen Cardiovascular status: blood pressure returned to baseline and stable Postop Assessment: no apparent nausea or vomiting Anesthetic complications: no   No complications documented.  Last Vitals:  Vitals:   07/05/20 1342 07/05/20 1345  BP:    Pulse: (!) 47 71  Resp: 16 14  Temp:    SpO2: 95% 95%    Last Pain:  Vitals:   07/05/20 1343  TempSrc:   PainSc: 5                  Vivia Rosenburg,E. Drea Jurewicz

## 2020-07-05 NOTE — H&P (View-Only) (Signed)
Referring Physician(s): Ottelin,M  Supervising Physician: Daryll Brod  Patient Status:  [facility] TBA  Chief Complaint:  Recurrent left renal cell carcinoma  Subjective: Patient familiar to IR service from paracenteses x5 in 2016, x1 2017, BRTO of gastric varices in 2016, random liver biopsy in 2017 and cryoablation of a left lateral 1.4 cm renal cell carcinoma in 2018.  He has a history of hepatitis C, cirrhosis, portal hypertension, chronic renal insufficiency, prior stroke in 1998, sickle cell trait, polysubstance abuse, hypertension, GERD, and CHF.  Follow-up MRI of the abdomen on 05/09/2020 revealed left upper pole renal ablation defect with an adjacent area measuring up to 1.7 cm with enhancement concerning for local regional recurrence.  There was no other adenopathy or evidence of metastatic disease.  Following discussions with Dr. Annamaria Boots patient presents today for CT-guided repeat cryoablation of the left renal cell carcinoma recurrence.  He currently denies fever, headache, chest pain, dyspnea, cough, abdominal pain, nausea, vomiting or bleeding.  He does use a walker to assist with ambulation secondary to chronic back issues.  He continues to smoke.  Past Medical History:  Diagnosis Date  . Allergy   . Arthritis   . Ascites   . Cataract   . CHF (congestive heart failure) ([place])   . Chronic back pain   . Chronic kidney disease   . Cirrhosis ([place])   . Dyspnea    walking   . ETOH abuse   . GERD (gastroesophageal reflux disease)   . Hepatitis   . History of blood transfusion   . History of bronchitis   . Hypertension   . Neuromuscular disorder ([place])   . Optic neuropathy, left   . Polysubstance abuse ([place])   . Seizures [facility])    age 65  . Sickle cell trait ([place])   . Stroke [facility]) 1998   Past Surgical History:  Procedure Laterality Date  . COLONOSCOPY    . ESOPHAGOGASTRODUODENOSCOPY N/A 08/14/2014   Procedure: ESOPHAGOGASTRODUODENOSCOPY (EGD);  Surgeon: Ladene Artist,  MD;  Location: [facility];  Service: Endoscopy;  Laterality: N/A;  . ESOPHAGOGASTRODUODENOSCOPY (EGD) WITH PROPOFOL N/A 06/03/2017   Procedure: ESOPHAGOGASTRODUODENOSCOPY (EGD) WITH PROPOFOL;  Surgeon: Ladene Artist, MD;  [facility];  Service: Endoscopy;  Laterality: N/A;  . IR RADIOLOGIST EVAL & MGMT  09/25/2016  . IR RADIOLOGIST EVAL & MGMT  12/10/2016  . IR RADIOLOGIST EVAL & MGMT  03/25/2018  . IR RADIOLOGIST EVAL & MGMT  03/24/2019  . IR RADIOLOGIST EVAL & MGMT  05/17/2020  . none    . RADIOFREQUENCY ABLATION Left 11/08/2016   Procedure: LEFT RENAL CRYOABLATION;  Surgeon: Greggory Keen, MD;  [facility];  Service: Anesthesiology;  Laterality: Left;  . RADIOLOGY WITH ANESTHESIA N/A 08/15/2014   Procedure: RADIOLOGY WITH ANESTHESIA;  Surgeon: Greggory Keen, MD;  Location: [facility];  Service: Radiology;  Laterality: N/A;  . UPPER GASTROINTESTINAL ENDOSCOPY        Allergies: Penicillins  Medications: Prior to Admission medications   Medication Sig Start Date End Date Taking? Authorizing Provider  allopurinol (ZYLOPRIM) 100 MG tablet Take 1 tablet (100 mg total) by mouth daily. 05/12/18  Yes Eugenie Filler, MD  amLODipine (NORVASC) 10 MG tablet Take 1 tablet (10 mg total) by mouth daily. 05/25/15  Yes Debbe Odea, MD  atorvastatin (LIPITOR) 20 MG tablet Take 20 mg by mouth daily. 04/29/19  Yes [provider]  colchicine 0.6 MG tablet Take 1 tablet (0.6 mg total) by mouth daily.  05/07/18  Yes Eugenie Filler, MD  FLOVENT HFA 220 MCG/ACT inhaler INHALE TWO PUFFS BY MOUTH INTO THE LUNGS 2 (TWO) TIMES DAILY. 01/27/19  Yes [provider]  fluticasone (FLONASE) 50 MCG/ACT nasal spray Use 1 squirt to each nostril daily as needed for sinus symptoms 01/27/19  Yes [provider]  folic acid (FOLVITE) 1 MG tablet Take 1 tablet (1 mg total) by mouth daily. 05/07/18  Yes Eugenie Filler, MD  furosemide (LASIX) 40 MG tablet Take 40 mg by mouth daily.    Yes [provider]  hydrALAZINE (APRESOLINE) 50 MG tablet Take 1 tablet (50 mg total) by mouth 3 (three) times daily. Patient taking differently: Take 50 mg by mouth in the morning and at bedtime. 05/25/15  Yes Debbe Odea, MD  lactulose (CHRONULAC) 10 GM/15ML solution Take 10 g by mouth daily as needed for mild constipation or moderate constipation.  11/01/16  Yes [provider]  meloxicam (MOBIC) 15 MG tablet Take 15 mg by mouth daily. 01/21/19  Yes [provider]  PROAIR HFA 108 (90 Base) MCG/ACT inhaler Inhale 1-2 puffs into the lungs every 6 (six) hours as needed for wheezing or shortness of breath.  10/02/17  Yes [provider]  spironolactone (ALDACTONE) 25 MG tablet Take 25 mg by mouth daily as needed. 06/13/18  Yes [provider]  BD VEO INSULIN SYR U/F 1/2UNIT 31G X 15/64" 0.3 ML MISC See admin instructions. Patient not taking: Reported on 06/27/2020 09/22/18   [provider]  omeprazole (PRILOSEC) 40 MG capsule TAKE ONE CAPSULE BY MOUTH DAILY 07/02/18   Ladene Artist, MD     Vital Signs: BP 117/79   Pulse 60   Temp 97.8 F (36.6 C) (Oral)   Resp 18   Ht 5\' 8"  (1.727 m)   Wt 235 lb (106.6 kg)   SpO2 97%   BMI 35.73 kg/m   Physical Exam awake, alert.  Chest with distant but clear breath sounds bilaterally.  Heart with regular rate and rhythm.  Abdomen protuberant, positive bowel sounds, nontender.  Trace pretibial edema bilaterally.  Imaging: No results found.  Labs:  CBC: No results for input(s): WBC, HGB, HCT, PLT in the last 8760 hours.  COAGS: Recent Labs    05/24/20 1150 07/05/20 0700  INR 1.1* 1.1    BMP: Recent Labs    07/05/20 0700  NA 133*  K 3.6  CL 99  CO2 22  GLUCOSE 86  BUN 14  CALCIUM 9.2  CREATININE 1.74*  GFRNONAA 44*    LIVER FUNCTION TESTS: Recent Labs    07/05/20 0700  BILITOT 1.1  AST 45*  ALT 32  ALKPHOS 104  PROT 7.3  ALBUMIN 4.1    Assessment and Plan: Patient  familiar to IR service from paracenteses x5 in 2016, x1 2017, BRTO of gastric varices in 2016, random liver biopsy in 2017 and cryoablation of a left lateral 1.4 cm renal cell carcinoma in 2018.  He has a history of hepatitis C, cirrhosis, portal hypertension, chronic renal insufficiency, prior stroke in 1998, sickle cell trait, polysubstance abuse, hypertension, GERD, and CHF.  Follow-up MRI of the abdomen on 05/09/2020 revealed left upper pole renal ablation defect with an adjacent area measuring up to 1.7 cm with enhancement concerning for local regional recurrence.  There was no other adenopathy or evidence of metastatic disease.  Following discussions with Dr. Annamaria Boots patient presents today for CT-guided repeat cryoablation of the left renal cell carcinoma recurrence.  Details/risks of procedure, including but not limited to, internal bleeding, infection, injury to adjacent structures, anesthesia related complications discussed with patient with his understanding and consent.  Post procedure he will be admitted for overnight observation.   Electronically Signed: D. Rowe Robert, PA-C 07/05/2020, 8:05 AM   I spent a total of 30 minutes at the the patient's bedside AND on the patient's hospital floor or unit, greater than 50% of which was counseling/coordinating care for image guided cryoablation of left renal cell carcinoma recurrence

## 2020-07-06 ENCOUNTER — Encounter (HOSPITAL_COMMUNITY): Payer: Self-pay | Admitting: Interventional Radiology

## 2020-07-06 DIAGNOSIS — C642 Malignant neoplasm of left kidney, except renal pelvis: Secondary | ICD-10-CM | POA: Diagnosis not present

## 2020-07-06 LAB — BASIC METABOLIC PANEL
Anion gap: 13 (ref 5–15)
BUN: 20 mg/dL (ref 8–23)
CO2: 20 mmol/L — ABNORMAL LOW (ref 22–32)
Calcium: 9.2 mg/dL (ref 8.9–10.3)
Chloride: 103 mmol/L (ref 98–111)
Creatinine, Ser: 1.57 mg/dL — ABNORMAL HIGH (ref 0.61–1.24)
GFR, Estimated: 49 mL/min — ABNORMAL LOW (ref >60)
Glucose, Bld: 102 mg/dL — ABNORMAL HIGH (ref 70–99)
Potassium: 4.6 mmol/L (ref 3.5–5.1)
Sodium: 136 mmol/L (ref 135–145)

## 2020-07-06 LAB — CBC
HCT: 33.8 % — ABNORMAL LOW (ref 39.0–52.0)
Hemoglobin: 11.1 g/dL — ABNORMAL LOW (ref 13.0–17.0)
MCH: 27.2 pg (ref 26.0–34.0)
MCHC: 32.8 g/dL (ref 30.0–36.0)
MCV: 82.8 fL (ref 80.0–100.0)
Platelets: 91 10*3/uL — ABNORMAL LOW (ref 150–400)
RBC: 4.08 MIL/uL — ABNORMAL LOW (ref 4.22–5.81)
RDW: 16.4 % — ABNORMAL HIGH (ref 11.5–15.5)
WBC: 9.2 10*3/uL (ref 4.0–10.5)
nRBC: 0 % (ref 0.0–0.2)

## 2020-07-06 NOTE — Discharge Summary (Addendum)
Patient ID: John [name] MRN: [id number] DOB/AGE: 09-26-1955 65 y.o.  Admit date: 07/05/2020 Discharge date: 07/06/2020  Supervising Physician: Daryll Brod  Patient Status: [facility] - In-pt  Admission Diagnoses: Recurrent left renal cell carcinoma  Discharge Diagnoses: Recurrent left renal cell carcinoma, status post CT-guided percutaneous cryoablation of a slowly enlarging 1.7 cm left upper pole renal mass at previous ablation site on 07/05/2020 Active Problems:   Renal mass, left  Past Medical History:  Diagnosis Date  . Allergy   . Arthritis   . Ascites   . Cataract   . CHF (congestive heart failure) ([place])   . Chronic back pain   . Chronic kidney disease   . Cirrhosis ([place])   . Dyspnea    walking   . ETOH abuse   . GERD (gastroesophageal reflux disease)   . Hepatitis   . History of blood transfusion   . History of bronchitis   . Hypertension   . Neuromuscular disorder ([place])   . Optic neuropathy, left   . Polysubstance abuse ([place])   . Seizures [facility])    age 79  . Sickle cell trait ([place])   . Stroke [facility]) 1998   Past Surgical History:  Procedure Laterality Date  . COLONOSCOPY    . ESOPHAGOGASTRODUODENOSCOPY N/A 08/14/2014   Procedure: ESOPHAGOGASTRODUODENOSCOPY (EGD);  Surgeon: Ladene Artist, MD;  Location[facility];  Service: Endoscopy;  Laterality: N/A;  . ESOPHAGOGASTRODUODENOSCOPY (EGD) WITH PROPOFOL N/A 06/03/2017   Procedure: ESOPHAGOGASTRODUODENOSCOPY (EGD) WITH PROPOFOL;  Surgeon: Ladene Artist, MD;  [facility];  Service: Endoscopy;  Laterality: N/A;  . IR RADIOLOGIST EVAL & MGMT  09/25/2016  . IR RADIOLOGIST EVAL & MGMT  12/10/2016  . IR RADIOLOGIST EVAL & MGMT  03/25/2018  . IR RADIOLOGIST EVAL & MGMT  03/24/2019  . IR RADIOLOGIST EVAL & MGMT  05/17/2020  . none    . RADIOFREQUENCY ABLATION Left 11/08/2016   Procedure: LEFT RENAL CRYOABLATION;  Surgeon: Greggory Keen, MD;  [facility];  Service: Anesthesiology;  Laterality: Left;  .  RADIOLOGY WITH ANESTHESIA N/A 08/15/2014   Procedure: RADIOLOGY WITH ANESTHESIA;  Surgeon: Greggory Keen, MD;  Location: [facility];  Service: Radiology;  Laterality: N/A;  . RADIOLOGY WITH ANESTHESIA Left 07/05/2020   Procedure: CT CRYOABLATION;  Surgeon: Greggory Keen, MD;  [facility];  Service: Anesthesiology;  Laterality: Left;  . UPPER GASTROINTESTINAL ENDOSCOPY       Discharged Condition: good  Hospital Course: John [name] is a 65 year old male familiar to IR service from paracenteses x5 in 2016, x1 2017, BRTO of gastric varices in 2016, random liver biopsy in 2017 and cryoablation of a left lateral 1.4 cm renal cell carcinoma in 2018.  He has a history of hepatitis C, cirrhosis, portal hypertension, chronic renal insufficiency, prior stroke in 1998, sickle cell trait, polysubstance abuse, hypertension, GERD, and CHF.  Follow-up MRI of the abdomen on 05/09/2020 revealed left upper pole renal ablation defect with an adjacent area measuring up to 1.7 cm with enhancement concerning for local regional recurrence.  There was no other adenopathy or evidence of metastatic disease.  Following discussions with Dr. Annamaria Boots patient presented to [facility] on 07/05/20  for CT-guided repeat cryoablation of the left renal cell carcinoma recurrence.  He subsequently underwent cryoablation of a slowly enlarging 1.7 cm left upper pole renal mass at the previous ablation site via general anesthesia.  He was admitted to the hospital overnight for observation.  Post procedure he did have some mild to  moderate left flank discomfort but no fever, chills, hematuria, nausea, vomiting.  On the morning of discharge the patient was stable.  He was able to ambulate with assistance of a walker, void and tolerate his diet without significant difficulty.  He had no hematuria.  He did have some mild left flank discomfort at puncture site as well as some epigastric discomfort with deep inspiration.  Vital signs were stable, follow-up  laboratory studies revealed normal white blood cell count, hemoglobin 11.1 down slightly from 12.3, platelets 91k down from 108k and creatinine 1.57 down from 1.74.  Above findings were discussed with Dr. Annamaria Boots and patient was deemed stable for discharge at this time.  He will resume his home medications.  He was told to stay well-hydrated.  He will follow up with Dr. Annamaria Boots either in office or via virtual modality in 1 month.  Follow-up imaging will be obtained in 4 months.  He was told to contact our service with any additional questions or concerns.  Consults: none  Significant Diagnostic Studies:  Results for orders placed or performed during the hospital encounter of 07/05/20  SARS Coronavirus 2 by RT PCR (hospital order, performed in [place] hospital lab) Nasopharyngeal Nasopharyngeal Swab   Specimen: Nasopharyngeal Swab  Result Value Ref Range   SARS Coronavirus 2 NEGATIVE NEGATIVE  CBC with Differential/Platelet  Result Value Ref Range   WBC 6.0 4.0 - 10.5 K/uL   RBC 4.58 4.22 - 5.81 MIL/uL   Hemoglobin 12.3 (L) 13.0 - 17.0 g/dL   HCT 37.3 (L) 39.0 - 52.0 %   MCV 81.4 80.0 - 100.0 fL   MCH 26.9 26.0 - 34.0 pg   MCHC 33.0 30.0 - 36.0 g/dL   RDW 16.4 (H) 11.5 - 15.5 %   Platelets 108 (L) 150 - 400 K/uL   nRBC 0.0 0.0 - 0.2 %   Neutrophils Relative % 56 %   Neutro Abs 3.3 1.7 - 7.7 K/uL   Lymphocytes Relative 26 %   Lymphs Abs 1.6 0.7 - 4.0 K/uL   Monocytes Relative 16 %   Monocytes Absolute 1.0 0.1 - 1.0 K/uL   Eosinophils Relative 1 %   Eosinophils Absolute 0.1 0.0 - 0.5 K/uL   Basophils Relative 1 %   Basophils Absolute 0.0 0.0 - 0.1 K/uL   Immature Granulocytes 0 %   Abs Immature Granulocytes 0.02 0.00 - 0.07 K/uL  Comprehensive metabolic panel  Result Value Ref Range   Sodium 133 (L) 135 - 145 mmol/L   Potassium 3.6 3.5 - 5.1 mmol/L   Chloride 99 98 - 111 mmol/L   CO2 22 22 - 32 mmol/L   Glucose, Bld 86 70 - 99 mg/dL   BUN 14 8 - 23 mg/dL   Creatinine, Ser 1.74  (H) 0.61 - 1.24 mg/dL   Calcium 9.2 8.9 - 10.3 mg/dL   Total Protein 7.3 6.5 - 8.1 g/dL   Albumin 4.1 3.5 - 5.0 g/dL   AST 45 (H) 15 - 41 U/L   ALT 32 0 - 44 U/L   Alkaline Phosphatase 104 38 - 126 U/L   Total Bilirubin 1.1 0.3 - 1.2 mg/dL   GFR, Estimated 44 (L) >60 mL/min   Anion gap 12 5 - 15  Protime-INR  Result Value Ref Range   Prothrombin Time 13.7 11.4 - 15.2 seconds   INR 1.1 0.8 - 1.2  CBC  Result Value Ref Range   WBC 9.2 4.0 - 10.5 K/uL   RBC 4.08 (L)  4.22 - 5.81 MIL/uL   Hemoglobin 11.1 (L) 13.0 - 17.0 g/dL   HCT 33.8 (L) 39.0 - 52.0 %   MCV 82.8 80.0 - 100.0 fL   MCH 27.2 26.0 - 34.0 pg   MCHC 32.8 30.0 - 36.0 g/dL   RDW 16.4 (H) 11.5 - 15.5 %   Platelets 91 (L) 150 - 400 K/uL   nRBC 0.0 0.0 - 0.2 %  Basic metabolic panel  Result Value Ref Range   Sodium 136 135 - 145 mmol/L   Potassium 4.6 3.5 - 5.1 mmol/L   Chloride 103 98 - 111 mmol/L   CO2 20 (L) 22 - 32 mmol/L   Glucose, Bld 102 (H) 70 - 99 mg/dL   BUN 20 8 - 23 mg/dL   Creatinine, Ser 1.57 (H) 0.61 - 1.24 mg/dL   Calcium 9.2 8.9 - 10.3 mg/dL   GFR, Estimated 49 (L) >60 mL/min   Anion gap 13 5 - 15  Type and screen [place]  Result Value Ref Range   ABO/RH(D) O POS    Antibody Screen NEG    Sample Expiration      07/08/2020,2359 Performed at [facility], [facility] [address], [place], [place] [address]      Treatments: CT-guided percutaneous cryoablation of a slowly enlarging 1.7 cm left upper pole renal mass/RCC at previous ablation site via general anesthesia on 07/05/20  Discharge Exam: Blood pressure 134/86, pulse 79, temperature 98.7 F (37.1 C), temperature source Oral, resp. rate 18, height 5\' 8"  (1.727 m), weight 235 lb (106.6 kg), SpO2 98 %. Awake, alert.  Chest with distant but clear breath sounds bilaterally.  Heart with normal rate, some occasional ectopy.  Abdomen protuberant, positive bowel sounds, nontender.  Trace pretibial edema bilaterally.   Puncture site left flank soft, clean, dry, minimal tenderness to palpation.  Disposition: Discharge disposition: 01-Home or Self Care      Current Outpatient Medications  Medication Instructions  . allopurinol (ZYLOPRIM) 100 mg, Oral, Daily  . amLODipine (NORVASC) 10 mg, Oral, Daily  . atorvastatin (LIPITOR) 40 mg, Oral, Daily  . benzonatate (TESSALON) 100 mg, Oral, 3 times daily PRN  . colchicine 0.6 mg, Oral, Daily  . diclofenac Sodium (VOLTAREN) 2 g, Topical, Every 8 hours PRN  . FLOVENT HFA 220 MCG/ACT inhaler 2 puffs, Inhalation, 2 times daily  . fluticasone (FLONASE) 50 MCG/ACT nasal spray 1 spray, Each Nare, Daily PRN  . folic acid (FOLVITE) 1 mg, Oral, Daily  . furosemide (LASIX) 40 mg, Oral, Daily  . guaiFENesin (MUCINEX) 600 mg, Oral, 2 times daily PRN  . hydrALAZINE (APRESOLINE) 50 mg, Oral, 3 times daily  . ketoconazole (NIZORAL) 2 % shampoo 1 application, Topical, See admin instructions, Prescribed 06/26/20: Apply topically in shower once every 3 days as needed for itching for 8 weeks.  . lactulose (CHRONULAC) 20 g, Oral, Daily PRN  . meloxicam (MOBIC) 15 mg, Oral, Daily  . omeprazole (PRILOSEC) 40 MG capsule TAKE ONE CAPSULE BY MOUTH DAILY  . PROAIR HFA 108 (90 Base) MCG/ACT inhaler 2 puffs, Inhalation, Every 6 hours PRN  . spironolactone (ALDACTONE) 25 mg, Oral, Daily PRN  . terbinafine (LAMISIL) 1 % cream 1 application, Topical, 2 times daily  . Vitamin D 2,000 Units, Oral, Daily     Discharge Instructions    Call MD for:  difficulty breathing, headache or visual disturbances   Complete by: As directed    Call MD for:  extreme fatigue   Complete  by: As directed    Call MD for:  hives   Complete by: As directed    Call MD for:  persistant dizziness or light-headedness   Complete by: As directed    Call MD for:  persistant nausea and vomiting   Complete by: As directed    Call MD for:  redness, tenderness, or signs of infection (pain, swelling, redness, odor  or green/yellow discharge around incision site)   Complete by: As directed    Call MD for:  severe uncontrolled pain   Complete by: As directed    Call MD for:  temperature >100.4   Complete by: As directed    Change dressing (specify)   Complete by: As directed    May apply bandage to puncture site left flank and change daily for the next 2 to 3 days.  May wash site with soap and water.   Diet - low sodium heart healthy   Complete by: As directed    Discharge instructions   Complete by: As directed    May resume home medications, stay well-hydrated, avoid strenuous activity   Increase activity slowly   Complete by: As directed    Lifting restrictions   Complete by: As directed    No heavy lifting for the next 3 to 4 days   May shower / Bathe   Complete by: As directed    Walker    Complete by: As directed    Use walker as previously doing to assist with ambulation       Follow-up Information    Greggory Keen, MD Follow up.   Specialties: Interventional Radiology, Radiology Why: Radiology service(Dr. Annamaria Boots) will follow up with you in 1 month; call [phone] or [phone] with any questions related to recent kidney procedure Contact information: [place] [address] [phone]        Kathie Rhodes, MD Follow up.   Specialty: Urology Why: follow up with Dr. Karsten Ro or primary care team as scheduled Contact information: [place] [address] [phone]                Electronically Signed: D. Rowe Robert, PA-C 07/06/2020, 10:06 AM   I have spent Less Than 30 Minutes discharging John [name].

## 2020-07-06 NOTE — Discharge Instructions (Signed)
Radiofrequency/cryoablation Ablation of kidney  tumors, Care After This sheet gives you information about how to care for yourself after your procedure. Your health care provider may also give you more specific instructions. If you have problems or questions, contact your health care provider. What can I expect after the procedure? After your procedure, it is common to have pain and discomfort in the upper abdomen. You will be given pain medicines to control this. Follow these instructions at home: Incision care  Follow instructions from your health care provider about how to take care of your incision. Make sure you: ? Wash your hands with soap and water before you change your bandage (dressing). If soap and water are not available, use hand sanitizer. ? Change your dressing as told by your health care provider. ? Leave stitches (sutures), skin glue, or adhesive strips in place. These skin closures may need to stay in place for 2 weeks or longer. If adhesive strip edges start to loosen and curl up, you may trim the loose edges. Do not remove adhesive strips completely unless your health care provider tells you to do that.  Check your incision area every day for signs of infection. Check for: ? Redness, swelling, or pain. ? Fluid or blood. ? Warmth. ? Pus or a bad smell.   Medicines  Take over-the-counter and prescription medicines only as told by your health care provider.  If you were prescribed an antibiotic medicine, use it as told by your health care provider. Do not stop using the antibiotic even if you start to feel better.  Do not drive or use heavy machinery while taking prescription pain medicine. Activity  Rest as often as needing during the first few days of recovery.  Do not lift anything that is heavier than 10 lbs (4.5 kg), or the limit that your health care provider tells you, until he or she says that it is safe.  Do not drive for 24 hours after the procedure if you  were given a medicine to help you relax (sedative). General instructions  Do not take baths, swim, or use a hot tub until your health care provider approves.  Follow any special diet instructions as directed by your health care provider.  To prevent or treat constipation while you are taking prescription pain medicine, your health care provider may recommend that you: ? Drink enough fluid to keep your urine clear or pale yellow. ? Take over-the-counter or prescription medicines. ? Eat foods that are high in fiber, such as fresh fruits and vegetables, whole grains, and beans. ? Limit foods that are high in fat and processed sugars, such as fried and sweet foods.  Do not use any products that contain nicotine or tobacco, such as cigarettes and e-cigarettes. If you need help quitting, ask your health care provider.  Keep all follow-up visits as told by your health care provider. This is important. Contact a health care provider if:  You cannot pass gas.  You are unable to have a bowel movement within 2 days.  You have a skin rash. Get help right away if:  You have a fever.  You have severe or lasting pain in your abdomen, shoulder, or back.  You have trouble swallowing or breathing.  You have severe weakness or dizziness.  You have chest pain or shortness of breath. Summary  After your procedure, it is common to have pain and discomfort in the upper abdomen. You will be given pain medicines to control this.  Do not take baths, swim, or use a hot tub until your health care provider approves.  Do not drive or use heavy machinery while taking prescription pain medicine. This information is not intended to replace advice given to you by your health care provider. Make sure you discuss any questions you have with your health care provider. Document Revised: 03/28/2017 Document Reviewed: 07/04/2016 Elsevier Patient Education  [place].

## 2020-07-19 ENCOUNTER — Encounter: Payer: Medicaid Other | Admitting: Podiatry

## 2020-07-25 ENCOUNTER — Encounter (HOSPITAL_BASED_OUTPATIENT_CLINIC_OR_DEPARTMENT_OTHER): Payer: Self-pay | Admitting: Podiatry

## 2020-07-25 ENCOUNTER — Other Ambulatory Visit: Payer: Self-pay

## 2020-07-25 ENCOUNTER — Other Ambulatory Visit (HOSPITAL_COMMUNITY)
Admission: RE | Admit: 2020-07-25 | Discharge: 2020-07-25 | Disposition: A | Discharge status: Home / Self Care | Payer: Medicaid Other | Source: Ambulatory Visit | Attending: Podiatry | Admitting: Podiatry

## 2020-07-25 DIAGNOSIS — Z20822 Contact with and (suspected) exposure to covid-19: Secondary | ICD-10-CM | POA: Insufficient documentation

## 2020-07-25 DIAGNOSIS — Z01812 Encounter for preprocedural laboratory examination: Secondary | ICD-10-CM | POA: Insufficient documentation

## 2020-07-25 LAB — SARS CORONAVIRUS 2 (TAT 6-24 HRS): SARS Coronavirus 2: NEGATIVE

## 2020-07-25 NOTE — Progress Notes (Addendum)
Spoke w/ via phone for pre-op interview---pt Lab needs dos----  I stat              Lab results------ekg 07-05-2020 epic, echo 07-09-2015 epic COVID test ------07-25-2020 1205 Arrive at -------1115 am 07-28-2020 NPO after MN NO Solid Food.  Clear liquids from MN until---1015 am then npo Med rec completed Medications to take morning of surgery -----amlodipine, hydrazaline, allopurinol, colchicine, omeprazole, atorvastatin, flovent inhaler, proair inhaler prn/bring inhaler Diabetic medication ----- Patient instructed to bring photo id and insurance card day of surgery Patient aware to have Driver (ride ) / caregiver  [facility] to bring pt, next door neighbor or niece  caregiver  for 24 hours after surgery  Patient Special Instructions -----none Pre-Op special Istructions -----none Patient verbalized understanding of instructions that were given at this phone interview. Patient denies shortness of breath, chest pain, fever, cough at this phone interview.  Patient to call [employer] and make sure they will take responsibility for getting him home after surgery if pace will not pt aware to get responsible adult to driver him home dos.  H & P/medical clearance  Dr Heinz Knuckles wang dated 07-13-2020 on chart for 07-28-2020 surgery  Patient states last marijuana with crack laced in it 06-26-2020, (urine drug dos ordered entered) ask [employer].  Addendum: spoke with dr Eligha Bridegroom and reviewed pt chart and 07-05-2020 ekg epic and 12-24-2016 ekg epic and echo 07-14-2015 and lov dr Justin Mend 07-03-2020 (all records on pt chart), pt ok for [facility] per dr Jaquelyn Bitter oddono.

## 2020-07-26 ENCOUNTER — Encounter: Payer: Medicaid Other | Admitting: Podiatry

## 2020-07-28 ENCOUNTER — Ambulatory Visit (HOSPITAL_BASED_OUTPATIENT_CLINIC_OR_DEPARTMENT_OTHER): Payer: Medicaid Other | Admitting: Anesthesiology

## 2020-07-28 ENCOUNTER — Ambulatory Visit (HOSPITAL_BASED_OUTPATIENT_CLINIC_OR_DEPARTMENT_OTHER)
Admission: RE | Admit: 2020-07-28 | Discharge: 2020-07-28 | Disposition: A | Discharge status: Home / Self Care | Payer: Medicaid Other | Attending: Podiatry | Admitting: Podiatry

## 2020-07-28 ENCOUNTER — Encounter (HOSPITAL_BASED_OUTPATIENT_CLINIC_OR_DEPARTMENT_OTHER)
Admission: RE | Disposition: A | Discharge status: Home / Self Care | Payer: Self-pay | Source: Home / Self Care | Attending: Podiatry

## 2020-07-28 ENCOUNTER — Encounter: Payer: Self-pay | Admitting: Podiatry

## 2020-07-28 ENCOUNTER — Encounter (HOSPITAL_BASED_OUTPATIENT_CLINIC_OR_DEPARTMENT_OTHER): Payer: Self-pay | Admitting: Podiatry

## 2020-07-28 DIAGNOSIS — Z791 Long term (current) use of non-steroidal anti-inflammatories (NSAID): Secondary | ICD-10-CM | POA: Insufficient documentation

## 2020-07-28 DIAGNOSIS — M2012 Hallux valgus (acquired), left foot: Secondary | ICD-10-CM

## 2020-07-28 DIAGNOSIS — Z79899 Other long term (current) drug therapy: Secondary | ICD-10-CM | POA: Diagnosis not present

## 2020-07-28 DIAGNOSIS — C642 Malignant neoplasm of left kidney, except renal pelvis: Secondary | ICD-10-CM | POA: Diagnosis not present

## 2020-07-28 DIAGNOSIS — M2042 Other hammer toe(s) (acquired), left foot: Secondary | ICD-10-CM | POA: Diagnosis not present

## 2020-07-28 HISTORY — DX: Gastric varices: I86.4

## 2020-07-28 HISTORY — PX: METATARSAL HEAD EXCISION: SHX5027

## 2020-07-28 HISTORY — DX: Atherosclerotic heart disease of native coronary artery without angina pectoris: I25.10

## 2020-07-28 HISTORY — DX: Presence of dental prosthetic device (complete) (partial): Z97.2

## 2020-07-28 HISTORY — DX: Peripheral vascular disease, unspecified: I73.9

## 2020-07-28 HISTORY — DX: Unspecified asthma, uncomplicated: J45.909

## 2020-07-28 HISTORY — DX: Presence of spectacles and contact lenses: Z97.3

## 2020-07-28 HISTORY — DX: Malignant neoplasm of unspecified kidney, except renal pelvis: C64.9

## 2020-07-28 HISTORY — PX: HAMMER TOE SURGERY: SHX385

## 2020-07-28 HISTORY — DX: Gout, unspecified: M10.9

## 2020-07-28 HISTORY — PX: HALLUX FUSION: SHX6621

## 2020-07-28 LAB — POCT I-STAT, CHEM 8
BUN: 23 mg/dL (ref 8–23)
Calcium, Ion: 1.24 mmol/L (ref 1.15–1.40)
Chloride: 100 mmol/L (ref 98–111)
Creatinine, Ser: 2 mg/dL — ABNORMAL HIGH (ref 0.61–1.24)
Glucose, Bld: 87 mg/dL (ref 70–99)
HCT: 38 % — ABNORMAL LOW (ref 39.0–52.0)
Hemoglobin: 12.9 g/dL — ABNORMAL LOW (ref 13.0–17.0)
Potassium: 3.7 mmol/L (ref 3.5–5.1)
Sodium: 138 mmol/L (ref 135–145)
TCO2: 25 mmol/L (ref 22–32)

## 2020-07-28 LAB — RAPID URINE DRUG SCREEN, HOSP PERFORMED
Amphetamines: NOT DETECTED
Barbiturates: NOT DETECTED
Benzodiazepines: NOT DETECTED
Cocaine: POSITIVE — AB
Opiates: NOT DETECTED
Tetrahydrocannabinol: POSITIVE — AB

## 2020-07-28 SURGERY — EXCISION, METATARSAL BONE, HEAD
Anesthesia: General | Site: Toe | Laterality: Left

## 2020-07-28 MED ORDER — MIDAZOLAM HCL 2 MG/2ML IJ SOLN
2.0000 mg | Freq: Once | INTRAMUSCULAR | Status: AC
  Administered 2020-07-28: 2 mg via INTRAVENOUS

## 2020-07-28 MED ORDER — FENTANYL CITRATE (PF) 100 MCG/2ML IJ SOLN
INTRAMUSCULAR | Status: AC
  Filled 2020-07-28: qty 2

## 2020-07-28 MED ORDER — PROPOFOL 500 MG/50ML IV EMUL
INTRAVENOUS | Status: AC
  Filled 2020-07-28: qty 50

## 2020-07-28 MED ORDER — OXYCODONE HCL 5 MG/5ML PO SOLN: 5.0000 mg | Freq: Once | ORAL | Status: DC | PRN

## 2020-07-28 MED ORDER — PROPOFOL 10 MG/ML IV BOLUS
INTRAVENOUS | Status: DC | PRN
  Administered 2020-07-28: 50 mg via INTRAVENOUS
  Administered 2020-07-28: 100 mg via INTRAVENOUS

## 2020-07-28 MED ORDER — ACETAMINOPHEN 10 MG/ML IV SOLN: 1000.0000 mg | Freq: Once | INTRAVENOUS | Status: DC | PRN

## 2020-07-28 MED ORDER — PROPOFOL 500 MG/50ML IV EMUL
INTRAVENOUS | Status: DC | PRN
  Administered 2020-07-28: 200 ug/kg/min via INTRAVENOUS

## 2020-07-28 MED ORDER — FENTANYL CITRATE (PF) 100 MCG/2ML IJ SOLN
100.0000 ug | Freq: Once | INTRAMUSCULAR | Status: AC
  Administered 2020-07-28: 100 ug via INTRAVENOUS

## 2020-07-28 MED ORDER — FENTANYL CITRATE (PF) 100 MCG/2ML IJ SOLN
25.0000 ug | INTRAMUSCULAR | Status: DC | PRN
Start: 2020-07-28 — End: 2020-07-28

## 2020-07-28 MED ORDER — ONDANSETRON HCL 4 MG/2ML IJ SOLN
INTRAMUSCULAR | Status: DC | PRN
  Administered 2020-07-28: 4 mg via INTRAVENOUS

## 2020-07-28 MED ORDER — PROMETHAZINE HCL 25 MG/ML IJ SOLN: 6.2500 mg | INTRAMUSCULAR | Status: DC | PRN

## 2020-07-28 MED ORDER — ROPIVACAINE HCL 5 MG/ML IJ SOLN
INTRAMUSCULAR | Status: DC | PRN
  Administered 2020-07-28: 30 mL via PERINEURAL

## 2020-07-28 MED ORDER — VANCOMYCIN HCL IN DEXTROSE 1-5 GM/200ML-% IV SOLN
INTRAVENOUS | Status: AC
  Filled 2020-07-28: qty 200

## 2020-07-28 MED ORDER — LIDOCAINE 2% (20 MG/ML) 5 ML SYRINGE
INTRAMUSCULAR | Status: AC
  Filled 2020-07-28: qty 5

## 2020-07-28 MED ORDER — DEXAMETHASONE SODIUM PHOSPHATE 10 MG/ML IJ SOLN
INTRAMUSCULAR | Status: DC | PRN
  Administered 2020-07-28: 10 mg

## 2020-07-28 MED ORDER — OXYCODONE-ACETAMINOPHEN 5-325 MG PO TABS: 1.0000 | ORAL_TABLET | ORAL | 0 refills | Status: DC | PRN

## 2020-07-28 MED ORDER — DOXYCYCLINE HYCLATE 100 MG PO CAPS: 100.0000 mg | ORAL_CAPSULE | Freq: Two times a day (BID) | ORAL | 0 refills | Status: DC

## 2020-07-28 MED ORDER — VANCOMYCIN HCL IN DEXTROSE 1-5 GM/200ML-% IV SOLN
1000.0000 mg | INTRAVENOUS | Status: AC
  Administered 2020-07-28: 1000 mg via INTRAVENOUS

## 2020-07-28 MED ORDER — ONDANSETRON HCL 4 MG/2ML IJ SOLN
INTRAMUSCULAR | Status: AC
  Filled 2020-07-28: qty 2

## 2020-07-28 MED ORDER — SODIUM CHLORIDE 0.9 % IV SOLN: INTRAVENOUS | Status: DC

## 2020-07-28 MED ORDER — FENTANYL CITRATE (PF) 100 MCG/2ML IJ SOLN
INTRAMUSCULAR | Status: DC | PRN
  Administered 2020-07-28 (×4): 25 ug via INTRAVENOUS

## 2020-07-28 MED ORDER — LIDOCAINE HCL 2 % IJ SOLN
INTRAMUSCULAR | Status: DC | PRN
  Administered 2020-07-28 (×2): 10 mL

## 2020-07-28 MED ORDER — MIDAZOLAM HCL 2 MG/2ML IJ SOLN
INTRAMUSCULAR | Status: AC
  Filled 2020-07-28: qty 2

## 2020-07-28 MED ORDER — OXYCODONE HCL 5 MG PO TABS: 5.0000 mg | ORAL_TABLET | Freq: Once | ORAL | Status: DC | PRN

## 2020-07-28 SURGICAL SUPPLY — 70 items
APL PRP STRL LF DISP 70% ISPRP (MISCELLANEOUS) ×2
BLADE AVERAGE 25X9 (BLADE) ×3 IMPLANT
BLADE SURG 15 STRL LF DISP TIS (BLADE) ×4 IMPLANT
BLADE SURG 15 STRL SS (BLADE) ×6
BNDG CMPR 9X4 STRL LF SNTH (GAUZE/BANDAGES/DRESSINGS) ×2
BNDG ELASTIC 4X5.8 VLCR STR LF (GAUZE/BANDAGES/DRESSINGS) ×3 IMPLANT
BNDG ESMARK 4X9 LF (GAUZE/BANDAGES/DRESSINGS) ×3 IMPLANT
BNDG GAUZE ELAST 4 BULKY (GAUZE/BANDAGES/DRESSINGS) ×3 IMPLANT
CHLORAPREP W/TINT 26 (MISCELLANEOUS) ×3 IMPLANT
COVER BACK TABLE 60X90IN (DRAPES) ×3 IMPLANT
COVER WAND RF STERILE (DRAPES) ×3 IMPLANT
CUFF TOURN SGL QUICK 18X4 (TOURNIQUET CUFF) ×3 IMPLANT
CUFF TOURN SGL QUICK 34 (TOURNIQUET CUFF)
CUFF TRNQT CYL 34X4.125X (TOURNIQUET CUFF) IMPLANT
DRAPE C-ARM 35X43 STRL (DRAPES) IMPLANT
DRAPE EXTREMITY T 121X128X90 (DISPOSABLE) ×3 IMPLANT
DRAPE IMP U-DRAPE 54X76 (DRAPES) IMPLANT
DRAPE SHEET LG 3/4 BI-LAMINATE (DRAPES) ×3 IMPLANT
DRAPE SURG 17X23 STRL (DRAPES) IMPLANT
DRSG PAD ABDOMINAL 8X10 ST (GAUZE/BANDAGES/DRESSINGS) ×3 IMPLANT
DYNAFORCE REAMERS ×3 IMPLANT
ELECT REM PT RETURN 9FT ADLT (ELECTROSURGICAL) ×3
ELECTRODE REM PT RTRN 9FT ADLT (ELECTROSURGICAL) ×2 IMPLANT
GAUZE 4X4 16PLY RFD (DISPOSABLE) IMPLANT
GAUZE SPONGE 4X4 12PLY STRL (GAUZE/BANDAGES/DRESSINGS) ×3 IMPLANT
GAUZE XEROFORM 1X8 LF (GAUZE/BANDAGES/DRESSINGS) ×3 IMPLANT
GLOVE SRG 8 PF TXTR STRL LF DI (GLOVE) IMPLANT
GLOVE SURG ENC MOIS LTX SZ8 (GLOVE) IMPLANT
GLOVE SURG UNDER POLY LF SZ6.5 (GLOVE) ×12 IMPLANT
GLOVE SURG UNDER POLY LF SZ8 (GLOVE)
GOWN STRL REUS W/TWL XL LVL3 (GOWN DISPOSABLE) ×6 IMPLANT
HANDLE RATCHET STRL (INSTRUMENTS) ×3 IMPLANT
K-WIRE .045X4 (WIRE)
K-WIRE CAPS STERILE WHITE .045 (WIRE) ×3 IMPLANT
K-WIRE DBL END TROCAR 6X.045 (WIRE) ×9
K-WIRE DBL END TROCAR 6X.062 (WIRE) ×3
KIT INSTRUMENT DYNAFORCE PLATE (KITS) ×3 IMPLANT
KIT TURNOVER CYSTO (KITS) ×3 IMPLANT
KWIRE .045X4 (WIRE) IMPLANT
KWIRE DBL END TROCAR 6X.045 (WIRE) ×6 IMPLANT
KWIRE DBL END TROCAR 6X.062 (WIRE) ×2 IMPLANT
NEEDLE HYPO 25X1 1.5 SAFETY (NEEDLE) ×3 IMPLANT
NS IRRIG 1000ML POUR BTL (IV SOLUTION) ×3 IMPLANT
PACK BASIN DAY SURGERY FS (CUSTOM PROCEDURE TRAY) ×3 IMPLANT
PADDING CAST ABS 4INX4YD NS (CAST SUPPLIES) ×2
PADDING CAST ABS COTTON 4X4 ST (CAST SUPPLIES) ×4 IMPLANT
PENCIL SMOKE EVACUATOR (MISCELLANEOUS) ×3 IMPLANT
PLATE MTP STANDARD 18 (Plate) ×3 IMPLANT
PLATE MTP STD 18 (Plate) ×2 IMPLANT
PUTTY DBM STAGRAFT PLUS 5CC (Putty) ×3 IMPLANT
REAMER CUP/CONE DYNAFORCE 20 (MISCELLANEOUS) ×3 IMPLANT
SCREW LOCK POLY 3.5X18 (Screw) ×6 IMPLANT
SCREW PA MOTO BAND 3.5X14 (Screw) ×6 IMPLANT
SPLINT FIBERGLASS 4X30 (CAST SUPPLIES) IMPLANT
STAPLE BONE KIT 18X14X14 (Staple) ×3 IMPLANT
STAPLER VISISTAT (STAPLE) ×3 IMPLANT
STOCKINETTE 6  STRL (DRAPES) ×1
STOCKINETTE 6 STRL (DRAPES) ×2 IMPLANT
SUCTION FRAZIER HANDLE 10FR (MISCELLANEOUS) ×1
SUCTION TUBE FRAZIER 10FR DISP (MISCELLANEOUS) ×2 IMPLANT
SUT MNCRL AB 3-0 PS2 18 (SUTURE) IMPLANT
SUT MNCRL AB 4-0 PS2 18 (SUTURE) ×3 IMPLANT
SUT MON AB 5-0 PS2 18 (SUTURE) IMPLANT
SUT PROLENE 4 0 PS 2 18 (SUTURE) ×3 IMPLANT
SUT VIC AB 4-0 P2 18 (SUTURE) IMPLANT
SYR BULB EAR ULCER 3OZ GRN STR (SYRINGE) ×3 IMPLANT
SYR CONTROL 10ML LL (SYRINGE) ×6 IMPLANT
TOWEL OR 17X26 10 PK STRL BLUE (TOWEL DISPOSABLE) ×3 IMPLANT
TUBE CONNECTING 12X1/4 (SUCTIONS) ×3 IMPLANT
UNDERPAD 30X36 HEAVY ABSORB (UNDERPADS AND DIAPERS) ×3 IMPLANT

## 2020-07-28 NOTE — Transfer of Care (Signed)
Immediate Anesthesia Transfer of Care Note  Patient: Pat Sires  Procedure(s) Performed: Procedure(s) (LRB): METATARSAL HEAD EXCISION TOES 2,3,AND 4  LEFT FOOT (Left) HAMMER TOE CORRECTION  2,3,AND4 LEFT FOOT (Left) HALLUX FUSION MPJ (Left)  Patient Location: PACU  Anesthesia Type: General  Level of Consciousness: awake, oriented, sedated and patient cooperative  Airway & Oxygen Therapy: Patient Spontanous Breathing and Patient connected to face mask oxygen  Post-op Assessment: Report given to PACU RN and Post -op Vital signs reviewed and stable  Post vital signs: Reviewed and stable  Complications: No apparent anesthesia complications  Last Vitals:  Vitals Value Taken Time  BP 121/81 07/28/20 1551  Temp    Pulse 78 07/28/20 1553  Resp 16 07/28/20 1553  SpO2 100 % 07/28/20 1553  Vitals shown include unvalidated device data.  Last Pain:  Vitals:   07/28/20 1144  TempSrc: Oral  PainSc: 0-No pain      Patients Stated Pain Goal: 5 (50/09/38 1829)  Complications: No complications documented.

## 2020-07-28 NOTE — Progress Notes (Signed)
Orthopedic Tech Progress Note Patient Details:  John [name] March 15, 1956 [id number]  Ortho Devices Type of Ortho Device: CAM walker Ortho Device/Splint Location: left       [name] 07/28/2020, 3:32 PM

## 2020-07-28 NOTE — Discharge Instructions (Signed)
Metatarsal Surgery, Care After This sheet gives you information about how to care for yourself after your procedure. Your health care provider may also give you more specific instructions. If you have problems or questions, contact your health care provider. What can I expect after the procedure? After the procedure, it is common to have:  Soreness.  Pain.  Stiffness.  Swelling. Follow these instructions at home: Medicines  Take over-the-counter and prescription medicines only as told by your health care provider.  Ask your health care provider if the medicine prescribed to you can cause constipation. You may need to take these actions to prevent or treat constipation: ? Drink enough fluids to keep your urine pale yellow. ? Take over-the-counter or prescription medicines. ? Eat foods that are high in fiber, such as beans, whole grains, and fresh fruits and vegetables. ? Limit foods that are high in fat and processed sugars, such as fried or sweet foods. If you have a splint or walking boot:  Wear it as told by your health care provider. Remove it only as told by your health care provider.  Loosen it if your toes tingle, become numb, or turn cold and blue.  Keep it clean.  If it is not waterproof: ? Do not let it get wet. ? Cover it with a watertight covering when you take a bath or shower. Bathing  Do not take baths, swim, or use a hot tub until your health care provider approves. Ask your health care provider if you may take showers. You may only be allowed to take sponge baths.  Keep the bandage (dressing) dry. Incision care  Follow instructions from your health care provider about how to take care of your incision. Make sure you: ? Wash your hands with soap and water for at least 20 seconds before and after you change your bandage. If soap and water are not available, use hand sanitizer. ? Leave stitches (sutures), skin glue, or adhesive strips in place. These skin  closures may need to stay in place for 2 weeks or longer. If adhesive strip edges start to loosen and curl up, you may trim the loose edges. Do not remove adhesive strips completely unless your health care provider tells you to do that.  Check your incision area every day for signs of infection. Check for: ? Redness, swelling, or pain. ? Fluid or blood. ? Warmth. ? Pus or a bad smell.   Managing pain, stiffness, and swelling  If directed, put ice on the injured area. To do this: ? If you have a removable splint, remove it as told by your health care provider. ? Put ice in a plastic bag. ? Place a towel between your skin and the bag. ? Leave the ice on for 20 minutes, 2-3 times a day. ? Remove the ice if your skin turns bright red. This is very important. If you cannot feel pain, heat, or cold, you have a greater risk of damage to the area.  Move your toes often to reduce stiffness and swelling.  Raise (elevate) the injured area above the level of your heart while you are sitting or lying down.   Driving  Ask your health care provider if the medicine prescribed to you requires you to avoid driving or using machinery.  If you were given a sedative during the procedure, it can affect you for several hours. Do not drive or operate machinery until your health care provider says that it is safe.  Ask your  health care provider when it is safe to drive if you have a dressing, splint, special shoe, or walking boot on your foot. General instructions  Do not remove the dressing around your foot until directed by your health care provider.  If you were given a splint, special shoe, or walking boot, wear it as told by your health care provider.  Do not use the injured limb to support your body weight until your health care provider says that you can. Use crutches or a walker as told by your health care provider.  Return to your normal activities as told by your health care provider. Ask your  health care provider what activities are safe for you.  Do not use any products that contain nicotine or tobacco, such as cigarettes, e-cigarettes, and chewing tobacco. These can delay bone healing. If you need help quitting, ask your health care provider.  Keep all follow-up visits. This is important. Contact a health care provider if:  You have a fever.  Your dressing becomes wet, loose, or stained with blood or discharge.  You have pus or a bad smell coming from your incision or bandage.  You have pain or stiffness that does not get better or gets worse.  You have tingling or numbness in your foot that does not get better or gets worse. Get help right away if:  Your foot becomes red, swollen, or tender, or you notice redness traveling upward from your incision.  You develop warm and tender swelling in your leg.  You have chest pain.  You have trouble breathing. These symptoms may represent a serious problem that is an emergency. Do not wait to see if the symptoms will go away. Get medical help right away. Call your local emergency services (911 in the [place] Do not drive yourself to the hospital. Summary  After the procedure, it is common to have soreness, pain, stiffness, and swelling.  Follow instructions on caring for your incision. Do not use the injured limb to support your body weight until your health care provider says that you can.  Contact your health care provider if you have a fever, have pus or a bad smell coming from your wound or dressing, or pain and stiffness that does not get better.  Get help right away if you develop a warm and tender swelling in your leg, have chest pain, or trouble breathing. This information is not intended to replace advice given to you by your health care provider. Make sure you discuss any questions you have with your health care provider. Document Revised: 07/22/2019 Document Reviewed: 07/22/2019 Elsevier Patient Education  2021  [place] Instructions  Activity: Get plenty of rest for the remainder of the day. A responsible individual must stay with you for 24 hours following the procedure.  For the next 24 hours, DO NOT: -Drive a car -Paediatric nurse -Drink alcoholic beverages -Take any medication unless instructed by your physician -Make any legal decisions or sign important papers.  Meals: Start with liquid foods such as gelatin or soup. Progress to regular foods as tolerated. Avoid greasy, spicy, heavy foods. If nausea and/or vomiting occur, drink only clear liquids until the nausea and/or vomiting subsides. Call your physician if vomiting continues.  Special Instructions/Symptoms: Your throat may feel dry or sore from the anesthesia or the breathing tube placed in your throat during surgery. If this causes discomfort, gargle with warm salt water. The discomfort should disappear within 24 hours.  If you had a scopolamine patch placed behind your ear for the management of post- operative nausea and/or vomiting:  1. The medication in the patch is effective for 72 hours, after which it should be removed.  Wrap patch in a tissue and discard in the trash. Wash hands thoroughly with soap and water. 2. You may remove the patch earlier than 72 hours if you experience unpleasant side effects which may include dry mouth, dizziness or visual disturbances. 3. Avoid touching the patch. Wash your hands with soap and water after contact with the patch.    Regional Anesthesia Blocks  1. Numbness or the inability to move the "blocked" extremity may last from 3-48 hours after placement. The length of time depends on the medication injected and your individual response to the medication. If the numbness is not going away after 48 hours, call your surgeon.  2. The extremity that is blocked will need to be protected until the numbness is gone and the  Strength has returned. Because you cannot  feel it, you will need to take extra care to avoid injury. Because it may be weak, you may have difficulty moving it or using it. You may not know what position it is in without looking at it while the block is in effect.  3. For blocks in the legs and feet, returning to weight bearing and walking needs to be done carefully. You will need to wait until the numbness is entirely gone and the strength has returned. You should be able to move your leg and foot normally before you try and bear weight or walk. You will need someone to be with you when you first try to ensure you do not fall and possibly risk injury.  4. Bruising and tenderness at the needle site are common side effects and will resolve in a few days.  5. Persistent numbness or new problems with movement should be communicated to the surgeon.

## 2020-07-28 NOTE — Anesthesia Postprocedure Evaluation (Signed)
Anesthesia Post Note  Patient: John [name]  Procedure(s) Performed: METATARSAL HEAD EXCISION TOES 2,3,AND 4  LEFT FOOT (Left Toe) HAMMER TOE CORRECTION  2,3,AND4 LEFT FOOT (Left Toe) HALLUX FUSION MPJ (Left Foot)     Patient location during evaluation: PACU Anesthesia Type: General Level of consciousness: awake and alert and oriented Pain management: pain level controlled Vital Signs Assessment: post-procedure vital signs reviewed and stable Respiratory status: spontaneous breathing, nonlabored ventilation and respiratory function stable Cardiovascular status: blood pressure returned to baseline and stable Postop Assessment: no apparent nausea or vomiting Anesthetic complications: no   No complications documented.  Last Vitals:  Vitals:   07/28/20 1615 07/28/20 1630  BP: 122/73 133/88  Pulse: 81 81  Resp: 14 14  Temp:    SpO2: 93% 97%    Last Pain:  Vitals:   07/28/20 1630  TempSrc:   PainSc: 5     LLE Motor Response: Purposeful movement (07/28/20 1630) LLE Sensation: Full sensation (07/28/20 1630)          Dru Primeau,[name]

## 2020-07-28 NOTE — H&P (Signed)
Anesthesia H&P Update: History and Physical Exam reviewed; patient is OK for planned anesthetic and procedure. ? ?

## 2020-07-28 NOTE — Anesthesia Procedure Notes (Signed)
Anesthesia Regional Block: Popliteal block   Pre-Anesthetic Checklist: ,, timeout performed, Correct Patient, Correct Site, Correct Laterality, Correct Procedure, Correct Position, site marked, Risks and benefits discussed,  Surgical consent,  Pre-op evaluation,  At surgeon's request and post-op pain management  Laterality: Left  Prep: Dura Prep       Needles:  Injection technique: Single-shot  Needle Type: Echogenic Stimulator Needle     Needle Length: 10cm  Needle Gauge: 20     Additional Needles:   Procedures:,,,, ultrasound used (permanent image in chart),,,,  Narrative:  Start time: 07/28/2020 12:30 PM End time: 07/28/2020 12:35 PM Injection made incrementally with aspirations every 5 mL.  Performed by: Personally  Anesthesiologist: Darral Dash, DO  Additional Notes: Patient identified. Risks/Benefits/Options discussed with patient including but not limited to bleeding, infection, nerve damage, failed block, incomplete pain control. Patient expressed understanding and wished to proceed. All questions were answered. Sterile technique was used throughout the entire procedure. Please see nursing notes for vital signs. Aspirated in 5cc intervals with injection for negative confirmation. Patient was given instructions on fall risk and not to get out of bed. All questions and concerns addressed with instructions to call with any issues or inadequate analgesia.

## 2020-07-28 NOTE — Anesthesia Procedure Notes (Signed)
Procedure Name: LMA Insertion Date/Time: 07/28/2020 2:22 PM Performed by: Suan Halter, CRNA Pre-anesthesia Checklist: Patient identified, Emergency Drugs available, Suction available and Patient being monitored Patient Re-evaluated:Patient Re-evaluated prior to induction Oxygen Delivery Method: Circle system utilized Preoxygenation: Pre-oxygenation with 100% oxygen Induction Type: IV induction Ventilation: Mask ventilation without difficulty LMA: LMA inserted LMA Size: 5.0 Number of attempts: 1 Airway Equipment and Method: Bite block Placement Confirmation: positive ETCO2 Tube secured with: Tape Dental Injury: Teeth and Oropharynx as per pre-operative assessment

## 2020-07-28 NOTE — Anesthesia Preprocedure Evaluation (Addendum)
Anesthesia Evaluation  Patient identified by MRN, date of birth, ID band Patient awake    Reviewed: Allergy & Precautions, NPO status , Patient's Chart, lab work & pertinent test results  Airway Mallampati: II  TM Distance: >3 FB Neck ROM: Full    Dental  (+) Edentulous Upper, Edentulous Lower   Pulmonary asthma , COPD,  COPD inhaler, Patient abstained from smoking., former smoker,    Pulmonary exam normal        Cardiovascular hypertension, Pt. on medications + CAD, + Peripheral Vascular Disease and +CHF   Rhythm:Regular Rate:Normal     Neuro/Psych Seizures - (infantile), Well Controlled,  CVA (1998)    GI/Hepatic GERD  Medicated,(+) Cirrhosis     substance abuse  alcohol use,   Endo/Other  negative endocrine ROS  Renal/GU CRFRenal diseaseRCC     Musculoskeletal  (+) Arthritis , Osteoarthritis,    Abdominal (+)  Abdomen: soft. Bowel sounds: normal.  Peds  Hematology  (+) anemia ,   Anesthesia Other Findings   Reproductive/Obstetrics                            Anesthesia Physical Anesthesia Plan  ASA: III  Anesthesia Plan: MAC and Regional   Post-op Pain Management:  Regional for Post-op pain   Induction: Intravenous  PONV Risk Score and Plan: 1 and Ondansetron, Dexamethasone, Propofol infusion, Midazolam and Treatment may vary due to age or medical condition  Airway Management Planned: Simple Face Mask, Natural Airway and Nasal Cannula  Additional Equipment: None  Intra-op Plan:   Post-operative Plan:   Informed Consent: I have reviewed the patients History and Physical, chart, labs and discussed the procedure including the risks, benefits and alternatives for the proposed anesthesia with the patient or authorized representative who has indicated his/her understanding and acceptance.     Dental advisory given  Plan Discussed with: CRNA  Anesthesia Plan Comments: (-  DOS note: Patient reported use of crack cocaine 07/27/20. UDS +cocaine. Patient not showing signs of acute toxicity, plan to proceed.  Lab Results      Component                Value               Date                      WBC                      9.2                 07/06/2020                HGB                      11.1 (L)            07/06/2020                HCT                      33.8 (L)            07/06/2020                MCV                      82.8  07/06/2020                PLT                      91 (L)              07/06/2020           Lab Results      Component                Value               Date                      NA                       136                 07/06/2020                K                        4.6                 07/06/2020                CO2                      20 (L)              07/06/2020                GLUCOSE                  102 (H)             07/06/2020                BUN                      20                  07/06/2020                CREATININE               1.57 (H)            07/06/2020                CALCIUM                  9.2                 07/06/2020                GFRNONAA                 49 (L)              07/06/2020                GFRAA                    >60                 05/06/2018          )       Anesthesia Quick Evaluation

## 2020-07-28 NOTE — Interval H&P Note (Signed)
History and Physical Interval Note:  07/28/2020 12:36 PM  John [name]  has presented today for surgery, with the diagnosis of HAMMER TOE,HALLUX ABDUCTO VALGUS.  The various methods of treatment have been discussed with the patient and family. After consideration of risks, benefits and other options for treatment, the patient has consented to  Procedure(s): METATARSAL HEAD EXCISION TOES 2,3,AND 4  LEFT FOOT (Left) HAMMER TOE CORRECTION  2,3,AND4 LEFT FOOT (Left) HALLUX FUSION MPJ (Left) as a surgical intervention.  The patient's history has been reviewed, patient examined, no change in status, stable for surgery.  I have reviewed the patient's chart and labs.  Questions were answered to the patient's satisfaction.     Edrick Kins

## 2020-07-28 NOTE — Progress Notes (Signed)
Assisted Dr. Jana Half with left, ultrasound guided, popliteal block. Side rails up, monitors on throughout procedure. See vital signs in flow sheet. Tolerated Procedure well.

## 2020-07-28 NOTE — Brief Op Note (Signed)
07/28/2020  3:59 PM  PATIENT:  John [name]  65 y.o. male  PRE-OPERATIVE DIAGNOSIS:  HAMMER TOE,HALLUX ABDUCTO VALGUS  POST-OPERATIVE DIAGNOSIS:  HAMMER TOE,HALLUX ABDUCTO VALGUS  PROCEDURE:  Procedure(s): METATARSAL HEAD EXCISION TOES 2,3,AND 4  LEFT FOOT (Left) HAMMER TOE CORRECTION  2,3,AND4 LEFT FOOT (Left) HALLUX FUSION MPJ (Left)  SURGEON:  Surgeon(s) and Role:    Edrick Kins, DPM - Primary  PHYSICIAN ASSISTANT:   ASSISTANTS: none   ANESTHESIA:   General anesthesia with regional popliteal block  EBL:  5 mL   BLOOD ADMINISTERED:none  DRAINS: none   LOCAL MEDICATIONS USED:  2% LIDOCAINE PLAIN   SPECIMEN:  No Specimen  DISPOSITION OF SPECIMEN:  N/A  COUNTS:  YES  TOURNIQUET:   Total Tourniquet Time Documented: Calf (Left) - 87 minutes Total: Calf (Left) - 87 minutes   DICTATION: .Viviann Spare Dictation  PLAN OF CARE: Discharge to home after PACU  PATIENT DISPOSITION:  PACU - hemodynamically stable.   Delay start of Pharmacological VTE agent (>24hrs) due to surgical blood loss or risk of bleeding: not applicable

## 2020-07-30 NOTE — Op Note (Signed)
OPERATIVE REPORT Patient name: John [name] MRN: [id number] DOB: Jan 12, 1956  DOS: 07/28/2020  Preop Dx: Hallux valgus left.  Hammertoe 2, 3, 4 left. Postop Dx: same  Procedure:  1.  First MTPJ Arthrodesis left 2, 3, 4.  PIPJ arthroplasty 2, 3, 4 left 5, 6, 7.  Metatarsal head resection 2, 3, 4 left  Surgeon: Edrick Kins DPM  Anesthesia: Regional anesthesia block  Hemostasis: Ankle tourniquet inflated to a pressure of 227mmHg after esmarch exsanguination   EBL: 20 mL Materials: [employer] Dynaforce Staple compression plate w/ respective screws.  5cc DBX bone putty.  Injectables: None Pathology: None  Condition: The patient tolerated the procedure and anesthesia well. No complications noted or reported   Justification for procedure: The patient is a 65 y.o. male who presents today for surgical correction of symptomatic hallux valgus with hammertoe deformities of the left foot. All conservative modalities of been unsuccessful in providing any sort of satisfactory alleviation of symptoms with the patient. The patient was told benefits as well as possible side effects of the surgery. The patient consented for surgical correction. The patient consent form was reviewed. All patient questions were answered. No guarantees were expressed or implied. The patient and the surgeon boson the patient consent form with the witness present and placed in the patient's chart.   Procedure in Detail: The patient was brought to the operating room, placed in the operating table in the supine position at which time an aseptic scrub and drape were performed about the patient's respective lower extremity after anesthesia was induced as described above. Attention was then directed to the surgical area where procedure number one commenced.  Procedure #1: First MTPJ arthrodesis left A 5 cm linear longitudinal skin incision was planned and made overlying the dorsal medial aspect of the first MTPJ  left foot.  Incision was carried down to the level of joint capsule with care taken to cut clamp ligate and retract away all small neurovascular structures traversing the incision site.  A linear capsulotomy was performed to expose the underlying joint capsule.  The plantar and lateral aspects of the capsule were released using a manual emery elevator inserted into the joint.  This allowed appropriate exposure for preparation of the joint.  A cup and cone reamer was utilized to resect away the cartilaginous surfaces of the head of the first metatarsal and base of the proximal phalanx.  After appropriate resection of the cartilaginous surfaces of the bone a 0.062 inch K wire was utilized for subchondral drilling of the arthrodesis site in order to allow better arthrodesis.  Temporary fixation of the first ray was achieved using a 0.2062 K wire and permanent internal fixation was utilized with a [employer] Dynaforce first MTPJ compression plate with respective screws.  This was all verified by intraoperative x-ray fluoroscopy which was satisfactory.  Any osseous defects or voids at the arthrodesis site were reinforced using DBX bone putty.  Light irrigation was then utilized in preparation for primary closure.  3-0 Vicryl suture was utilized to reapproximate subcutaneous tissue followed by 4-0 Vicryl suture to reapproximate subcuticular tissue followed by stainless steel skin staples to reapproximate superficial skin edges.  Procedure #2: PIPJ arthroplasty second digit left foot Attention was then directed to the second toe of the left foot where an elliptical skin incision was planned and made overlying the PIPJ of the digit.  The ellipse skin wedge was removed and a transverse tenotomy of the EDL tendon was performed to expose the  underlying PIPJ.  The hypertrophic head of the proximal phalanx was identified and soft tissue and capsular tissue was sharply reflected away.  An osteotomy was then created using a  sagittal saw at the surgical phalangeal neck of the proximal phalanx.  The hypertrophic head of the proximal phalanx was removed in toto.  Verified by intraoperative x-ray fluoroscopy.  Procedure #3, 4: PIPJ arthroplasty third and fourth digit left foot Procedures number three and four were performed in the exact same fashion and manner as procedure number two, with the exception that procedures number three and four were performed on the third and fourth digit of the left foot  Procedure #5: Second metatarsal head resection left foot A separate 3 cm linear longitudinal skin incision was planned and made overlying the second MTPJ of the left foot.  Incision was carried down to the level of EDL tendon with care taken to cut clamp ligate or retract away all small neurovascular structures traversing the incision site.  Transverse tenotomy was performed to expose the underlying MTPJ.  The head of the second metatarsal was identified and a sagittal blade mounted on the sagittal saw was utilized to create an osteotomy at the neck of the second metatarsal.  The distal metatarsal head was then grasped with a perforating towel clamp and all soft tissue structures around the metatarsal head were reflected away using a #15 scalpel and the head was removed in toto.  To allow for rectus healing postoperatively, a 0.045 inch percutaneous K wire was driven through the entire second ray of the foot.  The knee beginning at the distal tuft of the toe and crossing the DIPJ, PIPJ arthroplasty site, and MTPJ arthroplasty site it was driven retrograde and verified by intraoperative x-ray fluoroscopy.  The percutaneous portion extending onto the distal tip of the toe was bent dorsal at a 90 degree angle and cut with a synthetic ball Placed over the percutaneous portion.  Procedure #6, 7: Third and fourth metatarsal head resections left foot Procedures number six and seven were performed in the exact same fashion and manner as  procedure number five, with the exception that procedures number six and seven were performed on the third and fourth metatarsals of the left foot  Copious irrigation was then utilized to all previously mentioned incision sites about the patient's left foot in preparation for primary closure.  3-0 Vicryl suture was utilized to reapproximate deep subcutaneous tissue followed by 4-0 Vicryl suture to reapproximate superficial tissue followed by stainless steel skin staples to reapproximate superficial skin edges.  4-0 Prolene suture was utilized for primary closure of the transverse incision sites overlying the PIPJ's.  Dry sterile compressive dressings were then applied to all previously mentioned incision sites about the patient's lower extremity. The tourniquet which was used for hemostasis was deflated. All normal neurovascular responses including pink color and warmth returned all the digits of patient's lower extremity.  The patient was then transferred from the operating room to the recovery room having tolerated the procedure and anesthesia well. All vital signs are stable. After a brief stay in the recovery room the patient was discharged with adequate prescriptions for analgesia. Verbal as well as written instructions were provided for the patient regarding wound care. The patient is to keep the dressings clean dry and intact until they are to follow surgeon Dr. Daylene Katayama in the office upon discharge.   Edrick Kins, DPM [facility]  Dr. Edrick Kins, DPM  [address], [place] [address]                Office [phone]  Fax [phone]

## 2020-07-31 ENCOUNTER — Encounter (HOSPITAL_BASED_OUTPATIENT_CLINIC_OR_DEPARTMENT_OTHER): Payer: Self-pay | Admitting: Anesthesiology

## 2020-07-31 ENCOUNTER — Telehealth: Payer: Self-pay | Admitting: Podiatry

## 2020-07-31 NOTE — Anesthesia Postprocedure Evaluation (Signed)
Anesthesia Post Note  Patient: John [name]  Procedure(s) Performed: METATARSAL HEAD EXCISION TOES 2,3,AND 4  LEFT FOOT (Left Toe) HAMMER TOE CORRECTION  2,3,AND4 LEFT FOOT (Left Toe) HALLUX FUSION MPJ (Left Foot)     Patient location during evaluation: PACU Anesthesia Type: General and Regional Level of consciousness: awake and alert Pain management: pain level controlled Vital Signs Assessment: post-procedure vital signs reviewed and stable Respiratory status: spontaneous breathing, nonlabored ventilation, respiratory function stable and patient connected to nasal cannula oxygen Cardiovascular status: blood pressure returned to baseline and stable Postop Assessment: no apparent nausea or vomiting Anesthetic complications: no   No complications documented.  Last Vitals:  Vitals:   07/28/20 1630 07/28/20 1645  BP: 133/88 125/79  Pulse: 81 72  Resp: 14 18  Temp:  36.7 C  SpO2: 97% 94%    Last Pain:  Vitals:   07/28/20 1645  TempSrc:   PainSc: 0-No pain                 Belenda Cruise P Shaquna Geigle

## 2020-07-31 NOTE — Addendum Note (Signed)
Addendum  created 07/31/20 0931 by Rogers Blocker, CRNA   Charge Capture section accepted

## 2020-07-31 NOTE — Telephone Encounter (Signed)
[facility] called and stated they wanted to know the the status of non weight bearing for physical therapy. They also wanted to know the wound care instructions.

## 2020-07-31 NOTE — Telephone Encounter (Signed)
Weight bearing as tolerated in a CAM boot. Keep dressings clean and dry until f/u appt. - Dr. Amalia Hailey

## 2020-08-01 ENCOUNTER — Encounter (HOSPITAL_BASED_OUTPATIENT_CLINIC_OR_DEPARTMENT_OTHER): Payer: Self-pay | Admitting: Podiatry

## 2020-08-01 NOTE — Telephone Encounter (Signed)
I did speak with the nurse and told her that Dr. Amalia Hailey said "Weight bearing as tolerated in a CAM boot. Keep dressings clean and dry until f/u appt"

## 2020-08-03 ENCOUNTER — Other Ambulatory Visit: Payer: Self-pay

## 2020-08-03 ENCOUNTER — Ambulatory Visit
Admission: RE | Admit: 2020-08-03 | Discharge: 2020-08-03 | Disposition: A | Discharge status: Home / Self Care | Payer: Medicaid Other | Source: Ambulatory Visit | Attending: Radiology | Admitting: Radiology

## 2020-08-03 ENCOUNTER — Encounter: Payer: Self-pay | Admitting: *Deleted

## 2020-08-03 DIAGNOSIS — Z85528 Personal history of other malignant neoplasm of kidney: Secondary | ICD-10-CM

## 2020-08-03 DIAGNOSIS — N2889 Other specified disorders of kidney and ureter: Secondary | ICD-10-CM

## 2020-08-03 HISTORY — PX: IR RADIOLOGIST EVAL & MGMT: IMG5224

## 2020-08-03 NOTE — Progress Notes (Signed)
Patient ID: John [name], male   DOB: March 08, 1956, 65 y.o.   MRN: [id number]       Chief Complaint:  1 month status post left renal cryoablation  Referring Physician(s): Allred,Darrell K  History of Present Illness: John [name] is a 65 y.o. male who is now 1 month status post repeat left renal cryoablation.  He originally had left renal mass cryoablation in in July 2018.  Surveillance imaging demonstrated a slowly enlarging 1.7 cm enhancing mass along the left upper pole ablation defect compatible with local regional recurrence.  This area was targeted for treatment 1 month ago with repeat cryoablation.  Procedure performed at [facility] successfully.  He has had uneventful recovery.  In the interval, he has also had podiatry performed foot surgery which he is also recovering from.  From a renal standpoint he is back to his baseline.  No significant flank or abdominal pain.  No dysuria hematuria.  No recent illness or fevers.  Past Medical History:  Diagnosis Date  . Allergy   . Arthritis    oa back  . Ascites    resolved since 2018  . Asthma   . Cataract    both eyes  . CHF (congestive heart failure) ([place]) 2001   sees primary for  . Chronic back pain   . Chronic kidney disease    ckd 3  . Cirrhosis ([place]) resolved since 2018  . Coronary artery disease    nonobstrucrtibe  . Dyspnea    with heavy exertion  . ETOH abuse   . Gastric varices   . GERD (gastroesophageal reflux disease)   . Gout   . Hepatitis    hepatitic c 2018 24 week tx with ecuplipsa, no hepatitic c detected after tx  . History of blood transfusion 8-9- yrs ago  . Hypertension   . Optic neuropathy, left   . PAD (peripheral artery disease) ([facility])    slight  primary manages  . Polysubstance abuse ([place])   . Renal cell carcinoma ([place]) 2016, 2018 and 2022   left ablation  . Seizures [facility])    age 80 none since  . Sickle cell trait ([place])   . Stroke [facility]) 1998   "light no problems  with"  . Wears dentures   . Wears glasses     Past Surgical History:  Procedure Laterality Date  . COLONOSCOPY    . ESOPHAGOGASTRODUODENOSCOPY N/A 08/14/2014   Procedure: ESOPHAGOGASTRODUODENOSCOPY (EGD);  Surgeon: Ladene Artist, MD;  Location: [facility];  Service: Endoscopy;  Laterality: N/A;  . ESOPHAGOGASTRODUODENOSCOPY (EGD) WITH PROPOFOL N/A 06/03/2017   Procedure: ESOPHAGOGASTRODUODENOSCOPY (EGD) WITH PROPOFOL;  Surgeon: Ladene Artist, MD;  Location: [facility];  Service: Endoscopy;  Laterality: N/A;  . HALLUX FUSION Left 07/28/2020   Procedure: HALLUX FUSION MPJ;  Surgeon: Edrick Kins, DPM;  Location: [facility];  Service: Podiatry;  Laterality: Left;  . HAMMER TOE SURGERY Left 07/28/2020   Procedure: HAMMER TOE CORRECTION  2,3,AND4 LEFT FOOT;  Surgeon: Edrick Kins, DPM;  Location: [facility];  Service: Podiatry;  Laterality: Left;  . IR RADIOLOGIST EVAL & MGMT  09/25/2016  . IR RADIOLOGIST EVAL & MGMT  12/10/2016  . IR RADIOLOGIST EVAL & MGMT  03/25/2018  . IR RADIOLOGIST EVAL & MGMT  03/24/2019  . IR RADIOLOGIST EVAL & MGMT  05/17/2020  . METATARSAL HEAD EXCISION Left 07/28/2020   Procedure: METATARSAL HEAD EXCISION TOES 2,3,AND 4  LEFT FOOT;  Surgeon: Amalia Hailey,  Dorathy Daft, DPM;  Location: [facility];  Service: Podiatry;  Laterality: Left;  . none    . RADIOFREQUENCY ABLATION Left 11/08/2016   Procedure: LEFT RENAL CRYOABLATION;  Surgeon: Greggory Keen, MD;  Location: [facility];  Service: Anesthesiology;  Laterality: Left;  . RADIOLOGY WITH ANESTHESIA N/A 08/15/2014   Procedure: RADIOLOGY WITH ANESTHESIA;  Surgeon: Greggory Keen, MD;  Location: [facility];  Service: Radiology;  Laterality: N/A;  . RADIOLOGY WITH ANESTHESIA Left 07/05/2020   Procedure: CT CRYOABLATION;  Surgeon: Greggory Keen, MD;  Location: [facility];  Service: Anesthesiology;  Laterality: Left;  . UPPER GASTROINTESTINAL ENDOSCOPY       Allergies: Penicillins  Medications: Prior to Admission medications   Medication Sig Start Date End Date Taking? Authorizing Provider  allopurinol (ZYLOPRIM) 100 MG tablet Take 1 tablet (100 mg total) by mouth daily. 05/12/18   Eugenie Filler, MD  amLODipine (NORVASC) 10 MG tablet Take 1 tablet (10 mg total) by mouth daily. 05/25/15   Debbe Odea, MD  atorvastatin (LIPITOR) 40 MG tablet Take 40 mg by mouth daily. 04/29/19   [provider]  benzonatate (TESSALON) 100 MG capsule Take 100 mg by mouth 3 (three) times daily as needed for cough. Ran out of    [provider]  Cholecalciferol (VITAMIN D) 50 MCG (2000 UT) CAPS Take 2,000 Units by mouth daily.    [provider]  colchicine 0.6 MG tablet Take 1 tablet (0.6 mg total) by mouth daily. 05/07/18   Eugenie Filler, MD  diclofenac Sodium (VOLTAREN) 1 % GEL Apply 2 g topically every 8 (eight) hours as needed (back pain).    [provider]  doxycycline (VIBRAMYCIN) 100 MG capsule Take 1 capsule (100 mg total) by mouth 2 (two) times daily. 07/28/20   Edrick Kins, DPM  FLOVENT HFA 220 MCG/ACT inhaler Inhale 2 puffs into the lungs in the morning and at bedtime. 01/27/19   [provider]  fluticasone (FLONASE) 50 MCG/ACT nasal spray Place 1 spray into both nostrils daily as needed for allergies. Ran out of 01/27/19   [provider]  folic acid (FOLVITE) 1 MG tablet Take 1 tablet (1 mg total) by mouth daily. 05/07/18   Eugenie Filler, MD  furosemide (LASIX) 40 MG tablet Take 40 mg by mouth daily.    [provider]  guaiFENesin (MUCINEX) 600 MG 12 hr tablet Take 600 mg by mouth 2 (two) times daily as needed for to loosen phlegm. Pt ran out of    [provider]  hydrALAZINE (APRESOLINE) 50 MG tablet Take 1 tablet (50 mg total) by mouth 3 (three) times daily. Patient taking differently: Take 50 mg by mouth in the morning and at bedtime. 05/25/15   Debbe Odea, MD   ketoconazole (NIZORAL) 2 % shampoo Apply 1 application topically See admin instructions. Prescribed 06/26/20: Apply topically in shower once every 3 days as needed for itching for 8 weeks.    [provider]  lactulose (CHRONULAC) 10 GM/15ML solution Take 20 g by mouth daily as needed for mild constipation or moderate constipation. 11/01/16   [provider]  meloxicam (MOBIC) 15 MG tablet Take 15 mg by mouth daily. 01/21/19   [provider]  omeprazole (PRILOSEC) 40 MG capsule TAKE ONE CAPSULE BY MOUTH DAILY Patient taking differently: Take 40 mg by mouth daily as needed (acid reflux). 07/02/18   Ladene Artist, MD  oxyCODONE-acetaminophen (PERCOCET) 5-325 MG tablet Take 1 tablet by mouth  every 4 (four) hours as needed for severe pain. 07/28/20   Edrick Kins, DPM  PROAIR HFA 108 [phone] Base) MCG/ACT inhaler Inhale 2 puffs into the lungs every 6 (six) hours as needed for wheezing or shortness of breath. 10/02/17   [provider]  spironolactone (ALDACTONE) 25 MG tablet Take 25 mg by mouth daily as needed (swelling). 06/13/18   [provider]  terbinafine (LAMISIL) 1 % cream Apply 1 application topically 2 (two) times daily. Patient not taking: No sig reported    [provider]     Family History  Problem Relation Age of Onset  . Hypertension Mother        Living  . Kidney disease Mother   . Diabetes Mother   . Heart disease Mother   . Hypertension Father        Deceased, 80  . Ulcers Father   . Stomach cancer Father   . Hypertension Brother   . Diabetes Brother   . Kidney disease Brother   . Hypertension Sister   . Colon cancer Neg Hx   . Esophageal cancer Neg Hx   . Rectal cancer Neg Hx     Social History   Socioeconomic History  . Marital status: Single    Spouse name: Not on file  . Number of children: 0  . Years of education: Not on file  . Highest education level: Not on file  Occupational History  . Occupation: retired   Tobacco Use  . Smoking status: Former Smoker    Packs/day: 0.30    Years: 20.00    Pack years: 6.00    Types: Cigarettes  . Smokeless tobacco: Never Used  . Tobacco comment: 2015  Vaping Use  . Vaping Use: Never used  Substance and Sexual Activity  . Alcohol use: No    Alcohol/week: 0.0 standard drinks    Comment: stopped 8-9 yrs ago  . Drug use: Yes    Types: Cocaine, Marijuana    Comment:  smoked one joint laced with cocaine on  06/27/19  . Sexual activity: Not on file  Other Topics Concern  . Not on file  Social History Narrative   Lives with niece in a 2 story home.     On disability since 2015 for low back pain.  Used to work Statistician.    Highest level of education: 11th grade   Social Determinants of Health   Financial Resource Strain: Not on file  Food Insecurity: Not on file  Transportation Needs: Not on file  Physical Activity: Not on file  Stress: Not on file  Social Connections: Not on file     Review of Systems  Review of Systems: A 12 point ROS discussed and pertinent positives are indicated in the HPI above.  All other systems are negative.  Physical Exam No direct physical exam was performed, telephone health visit only today because of Covid pandemic Vital Signs: There were no vitals taken for this visit.  Imaging: DG Chest 1 View  Result Date: 07/05/2020 CLINICAL DATA:  Preoperative ablation procedure.  Hypertension. EXAM: CHEST  1 VIEW COMPARISON:  May 03, 2018 FINDINGS: Lungs are clear. Heart is slightly enlarged, stable, with pulmonary vascularity normal. No adenopathy. No bone lesions. IMPRESSION: Stable cardiac prominence.  Lungs clear. Electronically Signed   By: Lowella Grip III M.D.   On: 07/05/2020 08:12   CT GUIDE TISSUE ABLATION  Result Date: 07/05/2020 INDICATION: Enlarging left upper pole renal mass by surveillance  imaging at the margin of a previous ablation site. EXAM: CT-GUIDED PERCUTANEOUS CRYOABLATION OF EXOPHYTIC  LEFT UPPER POLE RENAL MASS ANESTHESIA/SEDATION: General MEDICATIONS: NONE. CONTRAST:  NONE. PROCEDURE: The procedure, risks, benefits, and alternatives were explained to the patient. Questions regarding the procedure were encouraged and answered. The patient understands and consents to the procedure. The patient was placed under general anesthesia. Initial unenhanced CT was performed in a RIGHT SIDE DOWN DECUBITUS position to localize the exophytic left upper pole renal mass measuring 1.7 cm. The patient was prepped with ChloraPrep in a sterile fashion, and a sterile drape was applied covering the operative field. A sterile gown and sterile gloves were used for the procedure. Under CT guidance, a ice pearl 2.1 CX percutaneous cryoablation probe was advanced into the 0.7 cm exophytic left upper pole renal mass. Probe positioning was confirmed by CT prior to cryoablation. Imaging was correlated with the prior MRI. Prior to ablation, a 22 gauge needle was advanced adjacent to the above ablation probe. 30 cc diluted contrast instilled for hydro dissection of the lesion away from the adjacent spleen. Cryoablation was performed through the single ice pearl 2.1 CX needle. Initial 10 minute cycle of cryoablation was performed. This was followed by a 6 minute thaw cycle. A second 10 minute cycle of cryoablation was then performed. During ablation, periodic CT imaging was performed to monitor ice ball formation and morphology. After active thaw and 30 second cautery, the cryoablation probe was removed. Post-procedural CT was performed. COMPLICATIONS: None immediate. FINDINGS: Imaging confirms single needle placement centered within the left upper pole exophytic 1.7 cm renal mass. Successful hydro dissection of the lesion to separate the adjacent spleen. Surveillance imaging during the treatment confirms adequate ice ball coverage of the lesion. Postprocedure imaging demonstrates trace amount of retroperitoneal perinephric  hemorrhage related to the needle placement. This remained unchanged throughout the procedure. No expanding or enlarging hematoma. IMPRESSION: CT guided percutaneous cryoablation of a slowly enlarging 1.7 cm left upper pole renal mass at a previous ablation site. The patient will be observed overnight. Initial follow-up will be performed in approximately 4 weeks. Electronically Signed   By: Jerilynn Mages.  Torryn Hudspeth M.D.   On: 07/05/2020 12:22    Labs:  CBC: Recent Labs    07/05/20 0700 07/06/20 0543 07/28/20 1226  WBC 6.0 9.2  --   HGB 12.3* 11.1* 12.9*  HCT 37.3* 33.8* 38.0*  PLT 108* 91*  --     COAGS: Recent Labs    05/24/20 1150 07/05/20 0700  INR 1.1* 1.1    BMP: Recent Labs    07/05/20 0700 07/06/20 0543 07/28/20 1226  NA 133* 136 138  K 3.6 4.6 3.7  CL 99 103 100  CO2 22 20*  --   GLUCOSE 86 102* 87  BUN 14 20 23   CALCIUM 9.2 9.2  --   CREATININE 1.74* 1.57* 2.00*  GFRNONAA 44* 49*  --     LIVER FUNCTION TESTS: Recent Labs    07/05/20 0700  BILITOT 1.1  AST 45*  ALT 32  ALKPHOS 104  PROT 7.3  ALBUMIN 4.1    TUMOR MARKERS: Recent Labs    05/24/20 1150  AFPTM 8.9*    Assessment and Plan:  1 month status post repeat CT-guided cryoablation of a slowly enlarging 1.7 cm left ablation defect marginal enhancement/lesion concerning for local regional recurrence.  This was performed at [facility] 07/05/2020.  He has recovered from the renal ablation procedure.  From a kidney standpoint he  is back to his baseline.  Plan: Scheduled for repeat surveillance MRI in 3 months at [facility].   Electronically Signed: Greggory Keen 08/03/2020, 10:07 AM   I spent a total of    25 Minutes in remote  clinical consultation, greater than 50% of which was counseling/coordinating care for this patient with left renal cell carcinoma.    Visit type: Audio only (telephone). Audio (no video) only due to patient's lack of internet/smartphone  capability. Alternative for in-person consultation at [facility], [address] [place], [place], [place]. This visit type was conducted due to national recommendations for restrictions regarding the COVID-19 Pandemic (e.g. social distancing).  This format is felt to be most appropriate for this patient at this time.  All issues noted in this document were discussed and addressed.

## 2020-08-09 ENCOUNTER — Other Ambulatory Visit: Payer: Self-pay

## 2020-08-09 ENCOUNTER — Other Ambulatory Visit: Payer: Self-pay | Admitting: Podiatry

## 2020-08-09 ENCOUNTER — Ambulatory Visit (INDEPENDENT_AMBULATORY_CARE_PROVIDER_SITE_OTHER): Payer: Medicaid Other | Admitting: Podiatry

## 2020-08-09 ENCOUNTER — Encounter: Payer: Medicaid Other | Admitting: Podiatry

## 2020-08-09 ENCOUNTER — Ambulatory Visit (INDEPENDENT_AMBULATORY_CARE_PROVIDER_SITE_OTHER): Payer: Medicaid Other

## 2020-08-09 DIAGNOSIS — Z9889 Other specified postprocedural states: Secondary | ICD-10-CM

## 2020-08-09 DIAGNOSIS — M79675 Pain in left toe(s): Secondary | ICD-10-CM

## 2020-08-09 NOTE — Progress Notes (Signed)
   Subjective:  Patient presents today status post left forefoot surgery, first MTPJ arthrodesis with hammertoe repair digits 2, 3, 4 left. DOS: 07/28/2020.  Patient states that overall he is doing well.  He has been nonweightbearing mostly in the cam boot.  Today he presents with the assistance of a wheelchair.  He is kept the dressings clean dry and intact for the past week.  Otherwise no new complaints at this time  Past Medical History:  Diagnosis Date  . Allergy   . Arthritis    oa back  . Ascites    resolved since 2018  . Asthma   . Cataract    both eyes  . CHF (congestive heart failure) ([place]) 2001   sees primary for  . Chronic back pain   . Chronic kidney disease    ckd 3  . Cirrhosis ([place]) resolved since 2018  . Coronary artery disease    nonobstrucrtibe  . Dyspnea    with heavy exertion  . ETOH abuse   . Gastric varices   . GERD (gastroesophageal reflux disease)   . Gout   . Hepatitis    hepatitic c 2018 24 week tx with ecuplipsa, no hepatitic c detected after tx  . History of blood transfusion 8-9- yrs ago  . Hypertension   . Optic neuropathy, left   . PAD (peripheral artery disease) (HCC)    slight  primary manages  . Polysubstance abuse ([place])   . Renal cell carcinoma ([place]) 2016, 2018 and 2022   left ablation  . Seizures [facility])    age 81 none since  . Sickle cell trait ([place])   . Stroke [facility]) 1998   "light no problems with"  . Wears dentures   . Wears glasses       Objective/Physical Exam Neurovascular status intact.  Skin incisions appear to be well coapted with sutures and staples intact. No sign of infectious process noted. No dehiscence. No active bleeding noted. Moderate edema noted to the surgical extremity.  Radiographic Exam:  Orthopedic hardware and osteotomies sites appear to be stable with routine healing.  Assessment: 1. s/p first MTPJ arthrodesis with hammertoe repair 2, 3, 4 left. DOS: 07/28/2020   Plan of Care:  1. Patient was evaluated.  X-rays reviewed 2.  Dressings changed today.  Keep clean dry and intact x1 week 3.  Continue nonweightbearing in the cam boot 4.  Return to clinic in 1 week for dressing change   Edrick Kins, DPM [facility]  Dr. Edrick Kins, DPM    [address], [place] [address]                Office [phone]  Fax [phone]

## 2020-08-16 ENCOUNTER — Other Ambulatory Visit: Payer: Self-pay

## 2020-08-16 ENCOUNTER — Ambulatory Visit (INDEPENDENT_AMBULATORY_CARE_PROVIDER_SITE_OTHER): Payer: Medicaid Other | Admitting: Podiatry

## 2020-08-16 DIAGNOSIS — Z9889 Other specified postprocedural states: Secondary | ICD-10-CM

## 2020-08-28 NOTE — Progress Notes (Signed)
   Subjective:  Patient presents today status post left forefoot surgery, first MTPJ arthrodesis with hammertoe repair digits 2, 3, 4 left. DOS: 07/28/2020.  Patient states that he continues to improve.  He has been slightly weightbearing in the cam boot.  He presents today with assistance from the wheelchair.  He is kept the dressings clean dry and intact for the past week  Past Medical History:  Diagnosis Date  . Allergy   . Arthritis    oa back  . Ascites    resolved since 2018  . Asthma   . Cataract    both eyes  . CHF (congestive heart failure) ([place]) 2001   sees primary for  . Chronic back pain   . Chronic kidney disease    ckd 3  . Cirrhosis ([place]) resolved since 2018  . Coronary artery disease    nonobstrucrtibe  . Dyspnea    with heavy exertion  . ETOH abuse   . Gastric varices   . GERD (gastroesophageal reflux disease)   . Gout   . Hepatitis    hepatitic c 2018 24 week tx with ecuplipsa, no hepatitic c detected after tx  . History of blood transfusion 8-9- yrs ago  . Hypertension   . Optic neuropathy, left   . PAD (peripheral artery disease) (HCC)    slight  primary manages  . Polysubstance abuse ([place])   . Renal cell carcinoma ([place]) 2016, 2018 and 2022   left ablation  . Seizures [facility])    age 65 none since  . Sickle cell trait ([place])   . Stroke [facility]) 1998   "light no problems with"  . Wears dentures   . Wears glasses       Objective/Physical Exam Neurovascular status intact.  Skin incisions appear to be well coapted with sutures and staples intact. No sign of infectious process noted. No dehiscence. No active bleeding noted. Moderate edema noted to the surgical extremity.  Assessment: 1. s/p first MTPJ arthrodesis with hammertoe repair 2, 3, 4 left. DOS: 07/28/2020   Plan of Care:  1. Patient was evaluated.  2.  Dressings changed today.  Keep clean dry and intact x1 week 3.  Continue nonweightbearing in the cam boot 4.  Return to clinic in 1 week for  suture removal   Edrick Kins, DPM [facility]  Dr. Edrick Kins, DPM    [address], [place] [address]                Office [phone]  Fax [phone]

## 2020-08-30 ENCOUNTER — Ambulatory Visit (INDEPENDENT_AMBULATORY_CARE_PROVIDER_SITE_OTHER): Payer: Medicaid Other

## 2020-08-30 ENCOUNTER — Ambulatory Visit (INDEPENDENT_AMBULATORY_CARE_PROVIDER_SITE_OTHER): Payer: Medicaid Other | Admitting: Podiatry

## 2020-08-30 ENCOUNTER — Other Ambulatory Visit: Payer: Self-pay

## 2020-08-30 DIAGNOSIS — Z9889 Other specified postprocedural states: Secondary | ICD-10-CM | POA: Diagnosis not present

## 2020-08-30 MED ORDER — DOXYCYCLINE HYCLATE 100 MG PO CAPS: 100.0000 mg | ORAL_CAPSULE | Freq: Two times a day (BID) | ORAL | 0 refills | Status: DC

## 2020-09-14 NOTE — Progress Notes (Signed)
   Subjective:  Patient presents today status post left forefoot surgery, first MTPJ arthrodesis with hammertoe repair digits 2, 3, 4 left. DOS: 07/28/2020.  Patient states that he continues to improve.  He is doing well.  Minimal pain.  No new complaints at this time  Past Medical History:  Diagnosis Date  . Allergy   . Arthritis    oa back  . Ascites    resolved since 2018  . Asthma   . Cataract    both eyes  . CHF (congestive heart failure) ([place]) 2001   sees primary for  . Chronic back pain   . Chronic kidney disease    ckd 3  . Cirrhosis ([place]) resolved since 2018  . Coronary artery disease    nonobstrucrtibe  . Dyspnea    with heavy exertion  . ETOH abuse   . Gastric varices   . GERD (gastroesophageal reflux disease)   . Gout   . Hepatitis    hepatitic c 2018 24 week tx with ecuplipsa, no hepatitic c detected after tx  . History of blood transfusion 8-9- yrs ago  . Hypertension   . Optic neuropathy, left   . PAD (peripheral artery disease) (HCC)    slight  primary manages  . Polysubstance abuse ([place])   . Renal cell carcinoma ([place]) 2016, 2018 and 2022   left ablation  . Seizures [facility])    age 6 none since  . Sickle cell trait ([place])   . Stroke [facility]) 1998   "light no problems with"  . Wears dentures   . Wears glasses       Objective/Physical Exam Neurovascular status intact. staples intact. No sign of infectious process noted. No active bleeding noted. Moderate edema noted to the surgical extremity.  There is an area of dehiscence noted along the incision of the first MTPJ measuring approximately 3.0 x 1.5 x 0.2 cm.  There is no exposed bone or hardware or tendon.  Periwound is intact.  Wound base is mostly granular  Assessment: 1. s/p first MTPJ arthrodesis with hammertoe repair 2, 3, 4 left. DOS: 07/28/2020 2.  Dehiscence overlying the first MTPJ incision site surgical foot   Plan of Care:  1. Patient was evaluated.  2.  Staples removed today.  Percutaneous  fixation pins also removed today 3.  Continue nonweightbearing in the cam boot 4.  Prescription for doxycycline 100 mg 2 times daily due to the area of dehiscence in the wound that is developed to the first MTPJ. 5.  Calcium alginate dressing was provided for the patient to apply daily 6.  Return to clinic in 3 weeks   Edrick Kins, DPM [facility]  Dr. Edrick Kins, DPM    [address], [place] [address]                Office [phone]  Fax [phone]

## 2020-09-18 ENCOUNTER — Encounter (HOSPITAL_COMMUNITY): Payer: Self-pay | Admitting: *Deleted

## 2020-09-20 ENCOUNTER — Other Ambulatory Visit: Payer: Self-pay

## 2020-09-20 ENCOUNTER — Ambulatory Visit (INDEPENDENT_AMBULATORY_CARE_PROVIDER_SITE_OTHER): Payer: Medicaid Other | Admitting: Podiatry

## 2020-09-20 DIAGNOSIS — Z9889 Other specified postprocedural states: Secondary | ICD-10-CM

## 2020-09-20 NOTE — Progress Notes (Signed)
   Subjective:  Patient presents today status post left forefoot surgery, first MTPJ arthrodesis with hammertoe repair digits 2, 3, 4 left. DOS: 07/28/2020.  Patient states that he continues to improve.  He is doing well.  Minimal pain.  No new complaints at this time  Past Medical History:  Diagnosis Date  . Allergy   . Arthritis    oa back  . Ascites    resolved since 2018  . Asthma   . Cataract    both eyes  . CHF (congestive heart failure) ([place]) 2001   sees primary for  . Chronic back pain   . Chronic kidney disease    ckd 3  . Cirrhosis ([place]) resolved since 2018  . Coronary artery disease    nonobstrucrtibe  . Dyspnea    with heavy exertion  . ETOH abuse   . Gastric varices   . GERD (gastroesophageal reflux disease)   . Gout   . Hepatitis    hepatitic c 2018 24 week tx with ecuplipsa, no hepatitic c detected after tx  . History of blood transfusion 8-9- yrs ago  . Hypertension   . Optic neuropathy, left   . PAD (peripheral artery disease) (HCC)    slight  primary manages  . Polysubstance abuse ([place])   . Renal cell carcinoma ([place]) 2016, 2018 and 2022   left ablation  . Seizures [facility])    age 54 none since  . Sickle cell trait ([place])   . Stroke [facility]) 1998   "light no problems with"  . Wears dentures   . Wears glasses       Objective/Physical Exam Neurovascular status intact. staples intact. No sign of infectious process noted. No active bleeding noted. Moderate edema noted to the surgical extremity.  There is an area of dehiscence noted along the incision of the first MTPJ measuring approximately 1.5x0.7 x 0.2 cm.  There is no exposed bone or hardware or tendon.  Periwound is intact.  Wound base is mostly granular  Assessment: 1. s/p first MTPJ arthrodesis with hammertoe repair 2, 3, 4 left. DOS: 07/28/2020 2.  Dehiscence overlying the first MTPJ incision site surgical foot   Plan of Care:  1. Patient was evaluated.  2.  Light debridement of the dorsal incision  site was performed to the wound.  The wound looks healthy with a granular wound base.  Looks very superficial as well. 3.  Recommend Silvadene cream and a Band-Aid daily.  Silvadene cream provided 4.  Discontinue cam boot.  Postsurgical shoe dispensed.  Weightbearing as tolerated.   5.  Return to clinic in 4 weeks for follow-up x-ray   Edrick Kins, DPM [facility]  Dr. Edrick Kins, DPM    [address], [place] [address]                Office [phone]  Fax [phone]

## 2020-10-23 ENCOUNTER — Ambulatory Visit (INDEPENDENT_AMBULATORY_CARE_PROVIDER_SITE_OTHER): Payer: Medicaid Other

## 2020-10-23 ENCOUNTER — Ambulatory Visit (INDEPENDENT_AMBULATORY_CARE_PROVIDER_SITE_OTHER): Payer: Medicaid Other | Admitting: Podiatry

## 2020-10-23 ENCOUNTER — Other Ambulatory Visit: Payer: Self-pay

## 2020-10-23 DIAGNOSIS — M2011 Hallux valgus (acquired), right foot: Secondary | ICD-10-CM

## 2020-10-23 DIAGNOSIS — M2041 Other hammer toe(s) (acquired), right foot: Secondary | ICD-10-CM

## 2020-10-23 DIAGNOSIS — Z9889 Other specified postprocedural states: Secondary | ICD-10-CM | POA: Diagnosis not present

## 2020-10-23 NOTE — Progress Notes (Signed)
Subjective: 65 y.o. male presenting today status post left forefoot surgery, first MTPJ arthrodesis with hammertoe repair digits 2, 3, 4 LT.  DOS: 07/28/2020.  Patient states that he is feeling very well.  He has been weightbearing in good supportive sneakers.  He has no pain associated to his left foot.  He is very satisfied with the postsurgical results.  Patient states that he would like to discuss surgery to the right foot now.  Patient states the right foot is incredibly painful and has been present for several years.  He has tried different shoe gear modifications and conservative treatment with no improvement.  He presents for further treatment and evaluation  Past Medical History:  Diagnosis Date   Allergy    Arthritis    oa back   Ascites    resolved since 2018   Asthma    Cataract    both eyes   CHF (congestive heart failure) ([place]) 2001   sees primary for   Chronic back pain    Chronic kidney disease    ckd 3   Cirrhosis ([place]) resolved since 2018   Coronary artery disease    nonobstrucrtibe   Dyspnea    with heavy exertion   ETOH abuse    Gastric varices    GERD (gastroesophageal reflux disease)    Gout    Hepatitis    hepatitic c 2018 24 week tx with ecuplipsa, no hepatitic c detected after tx   History of blood transfusion 8-9- yrs ago   Hypertension    Optic neuropathy, left    PAD (peripheral artery disease) ([place])    slight  primary manages   Polysubstance abuse ([place])    Renal cell carcinoma ([place]) 2016, 2018 and 2022   left ablation   Seizures [facility])    age 45 none since   Sickle cell trait ([place])    Stroke ([place]) 1998   "light no problems with"   Wears dentures    Wears glasses       Objective: Physical Exam General: The patient is alert and oriented x3 in no acute distress.  Dermatology: Skin is cool, dry and supple bilateral lower extremities. Negative for open lesions or macerations.  Skin incisions to the left foot have healed completely.  No open  wounds noted  Vascular: Palpable pedal pulses bilaterally. No edema or erythema noted. Capillary refill within normal limits.  Neurological: Epicritic and protective threshold grossly intact bilaterally.   Musculoskeletal Exam: Clinical evidence of bunion deformity noted to the right foot. There is moderate pain on palpation range of motion of the first MPJ. Lateral deviation of the hallux noted consistent with hallux abductovalgus. Lateral deviation noted at the MTP joints of the second third and fourth toes of the bilateral feet. Symptomatic pain on palpation and range of motion also noted to the metatarsal phalangeal joints of the respective hammertoe digits.    Left foot demonstrates good rectus alignment of the toes 1-5.  No range of motion noted to the first MTPJ of the left foot  Radiographic Exam:  RT foot: Increased intermetatarsal angle greater than 15 with a hallux abductus angle greater than 30 noted on AP view. Moderate degenerative changes noted within the first MPJ. Contracture deformity also noted to the interphalangeal joints and MPJs of the digits of the respective hammertoes.  There also appears to be elongated second metatarsal as well as third metatarsal in relationship to the overall metatarsal parabola.  LT foot: Orthopedic hardware is intact.  There appears to be good osseous union of the first MTPJ.  The toes are in good rectus alignment.  Absence of the metatarsal heads 2-4 noted.  Osteotomy sites appear clean with good routine healing    Assessment: 1. HAV w/ bunion deformity right 2.  Lateral deviation of the digits 2-4 right at the level of the MTPJ 3.  Elongated second and third metatarsals bilateral 4.  Status post left forefoot surgery.  DOS: 07/28/2020   Plan of Care:  1. Patient was evaluated.  Bilateral X-Rays reviewed again today. 2. Today we discussed the conservative versus surgical management of the presenting pathology. The patient opts for surgical  management. All possible complications and details of the procedure were explained. All patient questions were answered. No guarantees were expressed or implied. 3. Authorization for surgery was initiated today.  Surgery will consist of a similar approach that was performed to the left foot.  Surgery will consist of first MTPJ arthrodesis left.  Metatarsal head resections 2-4 left.  PIPJ arthroplasty 2-4 left.   4.  In regards to the right foot, patient may resume full activity no restrictions.  Recommend good supportive shoes and sneakers  5.  Return to clinic 1 week postop  Edrick Kins, DPM [facility]  Dr. Edrick Kins, DPM    [address], [place] [address]                Office [phone]  Fax [phone]

## 2020-10-25 ENCOUNTER — Other Ambulatory Visit: Payer: Self-pay | Admitting: Interventional Radiology

## 2020-10-25 DIAGNOSIS — N2889 Other specified disorders of kidney and ureter: Secondary | ICD-10-CM

## 2020-11-02 ENCOUNTER — Encounter: Payer: Self-pay | Admitting: Gastroenterology

## 2020-11-02 ENCOUNTER — Other Ambulatory Visit (INDEPENDENT_AMBULATORY_CARE_PROVIDER_SITE_OTHER): Payer: Medicaid Other

## 2020-11-02 ENCOUNTER — Ambulatory Visit (INDEPENDENT_AMBULATORY_CARE_PROVIDER_SITE_OTHER): Payer: Medicaid Other | Admitting: Gastroenterology

## 2020-11-02 ENCOUNTER — Encounter (HOSPITAL_COMMUNITY): Payer: Self-pay | Admitting: *Deleted

## 2020-11-02 VITALS — BP 108/72 | HR 67 | Ht 68.0 in | Wt 231.0 lb

## 2020-11-02 DIAGNOSIS — I864 Gastric varices: Secondary | ICD-10-CM | POA: Diagnosis not present

## 2020-11-02 DIAGNOSIS — R143 Flatulence: Secondary | ICD-10-CM

## 2020-11-02 DIAGNOSIS — K7460 Unspecified cirrhosis of liver: Secondary | ICD-10-CM

## 2020-11-02 DIAGNOSIS — K2100 Gastro-esophageal reflux disease with esophagitis, without bleeding: Secondary | ICD-10-CM

## 2020-11-02 LAB — CBC WITH DIFFERENTIAL/PLATELET
Basophils Absolute: 0.1 10*3/uL (ref 0.0–0.1)
Basophils Relative: 0.7 % (ref 0.0–3.0)
Eosinophils Absolute: 0.1 10*3/uL (ref 0.0–0.7)
Eosinophils Relative: 0.7 % (ref 0.0–5.0)
HCT: 35.9 % — ABNORMAL LOW (ref 39.0–52.0)
Hemoglobin: 12 g/dL — ABNORMAL LOW (ref 13.0–17.0)
Lymphocytes Relative: 19.2 % (ref 12.0–46.0)
Lymphs Abs: 1.5 10*3/uL (ref 0.7–4.0)
MCHC: 33.3 g/dL (ref 30.0–36.0)
MCV: 82.2 fl (ref 78.0–100.0)
Monocytes Absolute: 1.1 10*3/uL — ABNORMAL HIGH (ref 0.1–1.0)
Monocytes Relative: 14.1 % — ABNORMAL HIGH (ref 3.0–12.0)
Neutro Abs: 5.2 10*3/uL (ref 1.4–7.7)
Neutrophils Relative %: 65.3 % (ref 43.0–77.0)
Platelets: 130 10*3/uL — ABNORMAL LOW (ref 150.0–400.0)
RBC: 4.37 Mil/uL (ref 4.22–5.81)
RDW: 15.7 % — ABNORMAL HIGH (ref 11.5–15.5)
WBC: 8 10*3/uL (ref 4.0–10.5)

## 2020-11-02 LAB — COMPREHENSIVE METABOLIC PANEL
ALT: 17 U/L (ref 0–53)
AST: 22 U/L (ref 0–37)
Albumin: 4.3 g/dL (ref 3.5–5.2)
Alkaline Phosphatase: 107 U/L (ref 39–117)
BUN: 18 mg/dL (ref 6–23)
CO2: 28 mEq/L (ref 19–32)
Calcium: 9.8 mg/dL (ref 8.4–10.5)
Chloride: 101 mEq/L (ref 96–112)
Creatinine, Ser: 1.74 mg/dL — ABNORMAL HIGH (ref 0.40–1.50)
GFR: 41.09 mL/min — ABNORMAL LOW (ref >60.00)
Glucose, Bld: 92 mg/dL (ref 70–99)
Potassium: 4.8 mEq/L (ref 3.5–5.1)
Sodium: 136 mEq/L (ref 135–145)
Total Bilirubin: 0.8 mg/dL (ref 0.2–1.2)
Total Protein: 7.6 g/dL (ref 6.0–8.3)

## 2020-11-02 LAB — PROTIME-INR
INR: 1.1 ratio — ABNORMAL HIGH (ref 0.8–1.0)
Prothrombin Time: 12.7 s (ref 9.6–13.1)

## 2020-11-02 NOTE — Progress Notes (Signed)
    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  He has a history of HE, portal gastropathy, esophageal varices and bleeding gastric varices treated with BRTO.  He was felt not to be a TIPS candidate at the time of BRTO.  Hepatitis C previously treated with Epclusa in 2019 at [facility].  No alcohol use since 2016.  He complains of increased intestinal gas and flatulence for the past several weeks.  He denies any medication or diet changes.  He denies any recent antibiotics.  EGD 06/2020 - Grade I esophageal varices with no bleeding and no stigmata of recent bleeding. - Benign-appearing esophageal stenosis. - LA Grade A reflux esophagitis with no bleeding. - Portal hypertensive gastropathy. - Gastric varices, without bleeding. - Small hiatal hernia. - Normal duodenal bulb and second portion of the duodenum. - No specimens collected.  Current Medications, Allergies, Past Medical History, Past Surgical History, Family History and Social History were reviewed in [employer] record.   Physical Exam: General: Well developed, well nourished, chronically ill appearing, no acute distress Head: Normocephalic and atraumatic Eyes: Sclerae anicteric, EOMI Ears: Normal auditory acuity Mouth: Not examined, mask on during Covid-19 pandemic Lungs: Clear throughout to auscultation Heart: Regular rate and rhythm; no murmurs, rubs or bruits Abdomen: Soft, non tender and non distended. No masses, hepatosplenomegaly or hernias noted. Normal Bowel sounds Rectal: Not done Musculoskeletal: Symmetrical with no gross deformities  Pulses:  Normal pulses noted Extremities: No clubbing, cyanosis, edema or deformities noted Neurological: Alert oriented x 4, grossly nonfocal Psychological:  Alert and cooperative. 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  Hep C treated with Epclusa in 2019. He continues on Lasix 40 mg daily and spironolactone 25 mg daily without ascites noted on his last several imaging studies.  CMP, CBC, PT/INR, AFP today.  Coordinate abdominal MRI for Daniel screening with  IR - see #5.  REV in 6 months.  GERD with LA Grade A esophagitis and a benign-appearing esophageal stricture at the EGJ.  Continue omeprazole 40 mg daily (nor prn). Personal history of a tubular adenoma and serrated adenoma in November 2019.  Given his overall health status we will defer surveillance colonoscopy at this time. Gas, flatulence.  Follow-up gas reduction diet. Gas-X po qid prn.  If gas, flatulence persists he is advised to discontinue lactulose and take MiraLAX qd as needed for constipation. History of left renal cell carcinoma treated with cryoablation followed by IR.  Repeat cryoablation was performed in March 2022. He is due for follow-up with IR and due for an abdominal MRI which will be coordinated with IR - see #1.

## 2020-11-02 NOTE — Patient Instructions (Addendum)
Your provider has requested that you go to the basement level for lab work before leaving today. Press "B" on the elevator. The lab is located at the first door on the left as you exit the elevator.  You have been given a low gas diet to follow.   You can take over the counter Gas-X four times a day for gas and bloating.   Please have [facility] call interventional radiology to schedule your follow up MRI of the abdomen.   Normal BMI (Body Mass Index- based on height and weight) is between 19 and 25. Your BMI today is Body mass index is 35.12 kg/m. Marland Kitchen Please consider follow up  regarding your BMI with your Primary Care Provider., The [facility] providers would like to encourage you to use [facility] to communicate with providers for non-urgent requests or questions.  Due to long hold times on the telephone, sending your provider a message by [facility] may be a faster and more efficient way to get a response.  Please allow 48 business hours for a response.  Please remember that this is for non-urgent requests.   Due to recent changes in healthcare laws, you may see the results of your imaging and laboratory studies on MyChart before your provider has had a chance to review them.  We understand that in some cases there may be results that are confusing or concerning to you. Not all laboratory results come back in the same time frame and the provider may be waiting for multiple results in order to interpret others.  Please give [place] 48 hours in order for your provider to thoroughly review all the results before contacting the office for clarification of your results.   Thank you for choosing me and [facility].  Pricilla Riffle. Dagoberto Ligas., MD., [facility] Regal

## 2020-11-03 LAB — AFP TUMOR MARKER: AFP-Tumor Marker: 9.1 ng/mL — ABNORMAL HIGH (ref <6.1)

## 2020-11-06 ENCOUNTER — Other Ambulatory Visit: Payer: Self-pay

## 2020-11-06 DIAGNOSIS — K7460 Unspecified cirrhosis of liver: Secondary | ICD-10-CM

## 2020-11-06 NOTE — Progress Notes (Signed)
Bc

## 2020-11-06 NOTE — Addendum Note (Signed)
Addended by: Marlon Pel on: 11/06/2020 08:34 AM   Modules accepted: Orders

## 2020-11-16 ENCOUNTER — Ambulatory Visit (HOSPITAL_COMMUNITY)
Admission: RE | Admit: 2020-11-16 | Discharge: 2020-11-16 | Disposition: A | Discharge status: Home / Self Care | Payer: Medicaid Other | Source: Ambulatory Visit | Attending: Interventional Radiology | Admitting: Interventional Radiology

## 2020-11-16 ENCOUNTER — Other Ambulatory Visit: Payer: Self-pay

## 2020-11-16 DIAGNOSIS — N2889 Other specified disorders of kidney and ureter: Secondary | ICD-10-CM | POA: Diagnosis present

## 2020-11-16 MED ORDER — GADOBUTROL 1 MMOL/ML IV SOLN
10.0000 mL | Freq: Once | INTRAVENOUS | Status: AC | PRN
  Administered 2020-11-16: 10 mL via INTRAVENOUS

## 2020-11-20 ENCOUNTER — Telehealth: Payer: Self-pay | Admitting: Urology

## 2020-11-20 NOTE — Telephone Encounter (Signed)
DOS - 12/07/20  MET OSTEOTOMY 2-4 RIGHT --- 28308 HAMMERTOE REPAIR 2-4 RIGHT --- 60045 HALLUX MPJ FUSION RIGHT --- 99774  RECEIVED FAX FROM [facility] CPT CODES 14239 X'S 3, 53202 X'S 3 AND 33435, AUTH #[id number], GOOD FROM 11/03/20 - 04/28/21.

## 2020-11-22 ENCOUNTER — Ambulatory Visit
Admission: RE | Admit: 2020-11-22 | Discharge: 2020-11-22 | Disposition: A | Discharge status: Home / Self Care | Payer: Medicaid Other | Source: Ambulatory Visit | Attending: Interventional Radiology | Admitting: Interventional Radiology

## 2020-11-22 ENCOUNTER — Other Ambulatory Visit: Payer: Self-pay

## 2020-11-22 ENCOUNTER — Encounter: Payer: Self-pay | Admitting: *Deleted

## 2020-11-22 DIAGNOSIS — N2889 Other specified disorders of kidney and ureter: Secondary | ICD-10-CM

## 2020-11-22 HISTORY — PX: IR RADIOLOGIST EVAL & MGMT: IMG5224

## 2020-11-22 NOTE — Progress Notes (Signed)
Patient ID: John [name], male   DOB: 1955-09-25, 65 y.o.   MRN: [id number]       Chief Complaint:  56-monthstatus post repeat left renal cryoablation  Referring Physician(s): [facility]  History of Present Illness: John [name] a 65y.o. male with a history of mild chronic renal insufficiency, hepatitis C, cirrhosis and portal hypertension.  He is known to our service because he has had a previous B RTO for bleeding gastric varices in 2016 he has undergone 2 left renal cryoablation's originally performed 2018 and then most recently 4 months ago.  Overall he is doing very well.  No recent illness or fever.  No urinary tract symptoms, hematuria or dysuria.  He is back to his baseline.  Surveillance MRI performed last week 11/16/2020.  This demonstrates no residual enhancement at the ablation site.  Overall expected findings posttreatment.  No complication.  Past Medical History:  Diagnosis Date   Allergy    Arthritis    oa back   Ascites    resolved since 2018   Asthma    Cataract    both eyes   CH[place] (congestive heart [place]) [place]   sees primary for   Chronic back pain    Chronic kidney disease    ckd 3   [place]   Coronary artery disease    nonobstrucrtibe   Dyspnea    with heavy exertion   ETOH abuse    Gastric varices    GERD (gastroesophageal reflux disease)    Gout    Hepatitis    hepatitic c 2018 24 week tx with ecuplipsa, no hepatitic c detected after tx   History of blood transfusion 8-9- yrs ago   Hypertension    Optic neuropathy, left    PAD ([place]ipheral artery disease) ([place]    slight  primary manages   Polysubstance [place] ([place]    [place]al cell carcinoma ([place] 2016, 2018 and 2022   left ablation   Seizures ([facility]    age 89 none since   Sickle cell trait [place]    [place] ([place]   "light no problems with"   Wears dentures    Wears glasses     Past Surgical History:  Procedure Laterality Date    COLONOSCOPY     ESOPHAGOGASTRODUODENOSCOPY N/A 08/14/2014   Procedure: ESOPHAGOGASTRODUODENOSCOPY (EGD);  Surgeon: MLadene Artist MD;  [facility];  Service: Endoscopy;  Laterality: N/A;   ESOPHAGOGASTRODUODENOSCOPY (EGD) WITH PROPOFOL N/A 06/03/2017   Procedure: ESOPHAGOGASTRODUODENOSCOPY (EGD) WITH PROPOFOL;  Surgeon: SLadene Artist MD;  [facility];  Service: Endoscopy;  Laterality: N/A;   HALLUX FUSION Left 07/28/2020   Procedure: HALLUX FUSION MPJ;  Surgeon: EEdrick Kins DPM;  [facility]  Service: Podiatry;  Laterality: Left;   HAMMER TOE SURGERY Left 07/28/2020   Procedure: HAMMER TOE CORRECTION  2,3,AND4 LEFT FOOT;  Surgeon: EEdrick Kins DPM;  [facility] [facility]  Service: Podiatry;  Laterality: Left;   IR RADIOLOGIST EVAL & MGMT  09/25/2016   IR RADIOLOGIST EVAL & MGMT  12/10/2016   IR RADIOLOGIST EVAL & MGMT  03/25/2018   IR RADIOLOGIST EVAL & MGMT  03/24/2019   IR RADIOLOGIST EVAL & MGMT  05/17/2020   IR RADIOLOGIST EVAL & MGMT  08/03/2020   METATARSAL HEAD EXCISION Left 07/28/2020   Procedure: METATARSAL HEAD EXCISION TOES 2,3,AND 4  LEFT FOOT;  Surgeon: EEdrick Kins DPM;  Location: [employer];  Service: Podiatry;  Laterality: Left;   none     RADIOFREQUENCY ABLATION Left 11/08/2016   Procedure: LEFT RENAL CRYOABLATION;  Surgeon: Greggory Keen, MD;  [facility];  Service: Anesthesiology;  Laterality: Left;   RADIOLOGY WITH ANESTHESIA N/A 08/15/2014   Procedure: RADIOLOGY WITH ANESTHESIA;  Surgeon: Greggory Keen, MD;  Location[facility];  Service: Radiology;  Laterality: N/A;   RADIOLOGY WITH ANESTHESIA Left 07/05/2020   Procedure: CT CRYOABLATION;  Surgeon: Greggory Keen, MD;  [facility];  Service: Anesthesiology;  Laterality: Left;   UPPER GASTROINTESTINAL ENDOSCOPY      Allergies: Penicillins  Medications: Prior to Admission medications   Medication Sig Start Date End Date Taking?  Authorizing Provider  allopurinol (ZYLOPRIM) 100 MG tablet Take 1 tablet (100 mg total) by mouth daily. 05/12/18   Eugenie Filler, MD  amLODipine (NORVASC) 10 MG tablet Take 1 tablet (10 mg total) by mouth daily. 05/25/15   Debbe Odea, MD  atorvastatin (LIPITOR) 20 MG tablet Take 1 tablet by mouth daily. 03/09/20   [provider]  atorvastatin (LIPITOR) 40 MG tablet Take 40 mg by mouth daily. 04/29/19   [provider]  Cholecalciferol (VITAMIN D) 50 MCG (2000 UT) CAPS Take 2,000 Units by mouth daily.    [provider]  colchicine 0.6 MG tablet Take 1 tablet (0.6 mg total) by mouth daily. 05/07/18   Eugenie Filler, MD  FLOVENT HFA 220 MCG/ACT inhaler Inhale 2 puffs into the lungs in the morning and at bedtime. 01/27/19   [provider]  folic acid (FOLVITE) 1 MG tablet Take 1 tablet (1 mg total) by mouth daily. 05/07/18   Eugenie Filler, MD  furosemide (LASIX) 40 MG tablet Take 40 mg by mouth daily.    [provider]  hydrALAZINE (APRESOLINE) 50 MG tablet Take 1 tablet (50 mg total) by mouth 3 (three) times daily. Patient taking differently: Take 50 mg by mouth in the morning and at bedtime. 05/25/15   Debbe Odea, MD  ketoconazole (NIZORAL) 2 % shampoo Apply 1 application topically See admin instructions. Prescribed 06/26/20: Apply topically in shower once every 3 days as needed for itching for 8 weeks.    [provider]  lactulose (CHRONULAC) 10 GM/15ML solution Take 20 g by mouth daily as needed for mild constipation or moderate constipation. 11/01/16   [provider]  omeprazole (PRILOSEC) 40 MG capsule TAKE ONE CAPSULE BY MOUTH DAILY Patient taking differently: Take 40 mg by mouth daily as needed (acid reflux). 07/02/18   Ladene Artist, MD  PROAIR HFA 108 [phone] Base) MCG/ACT inhaler Inhale 2 puffs into the lungs every 6 (six) hours as needed for wheezing or shortness of breath. 10/02/17   [provider]   spironolactone (ALDACTONE) 25 MG tablet Take 25 mg by mouth daily as needed (swelling). 06/13/18   [provider]     Family History  Problem Relation Age of Onset   Hypertension Mother        Living   Kidney disease Mother    Diabetes Mother    Heart disease Mother    Hypertension Father        Deceased, 26   Ulcers Father    Stomach cancer Father    Hypertension Brother    Diabetes Brother    Kidney disease Brother    Hypertension Sister    Colon cancer Neg Hx    Esophageal cancer Neg Hx  Rectal cancer Neg Hx     Social History   Socioeconomic History   Marital status: Single    Spouse name: Not on file   Number of children: 0   Years of education: Not on file   Highest education level: Not on file  Occupational History   Occupation: retired  Tobacco Use   Smoking status: Former    Packs/day: 0.30    Years: 20.00    Pack years: 6.00    Types: Cigarettes   Smokeless tobacco: Never   Tobacco comments:    2015  Vaping Use   Vaping Use: Never used  Substance and Sexual Activity   Alcohol use: No    Alcohol/week: 0.0 standard drinks    Comment: stopped 8-9 yrs ago   Drug use: Yes    Types: Cocaine, Marijuana    Comment:  smoked one joint laced with cocaine on  06/27/19   Sexual activity: Not on file  Other Topics Concern   Not on file  Social History Narrative   Lives with niece in a 2 story home.     On disability since 2015 for low back pain.  Used to work Statistician.    Highest level of education: 11th grade   Social Determinants of Health   Financial Resource Strain: Not on file  Food Insecurity: Not on file  Transportation Needs: Not on file  Physical Activity: Not on file  Stress: Not on file  Social Connections: Not on file     Review of Systems  Review of Systems: A 12 point ROS discussed and pertinent positives are indicated in the HPI above.  All other systems are negative.  Physical Exam No direct physical exam was  performed telephone health visit only today because of COVID pandemic Vital Signs: There were no vitals taken for this visit.  Imaging: MR ABDOMEN WWO CONTRAST  Result Date: 11/17/2020 CLINICAL DATA:  Left renal mass with prior cryoablation and repeat cryoablation. EXAM: MRI ABDOMEN WITHOUT AND WITH CONTRAST TECHNIQUE: Multiplanar multisequence MR imaging of the abdomen was performed both before and after the administration of intravenous contrast. CONTRAST:  23m GADAVIST GADOBUTROL 1 MMOL/ML IV SOLN COMPARISON:  Multiple exams, including 05/09/2020 FINDINGS: Lower chest: Mild cardiomegaly.  Uphill paraesophageal varices. Hepatobiliary: Hepatic cirrhosis. Contracted gallbladder. No biliary dilatation. Pancreas:  Unremarkable Spleen:  Unremarkable Adrenals/Urinary Tract: Both adrenal glands appear normal. Small hypodense right renal lesion is compatible with tiny cysts. Cryoablation extension noted with no current enhancement at the prior recurrent site, compatible with successful retreatment. 1.3 by 1.3 cm complex lesion in the left kidney upper pole anteriorly with accentuated precontrast T1 signal characteristics but no definite enhancement, favoring a Bosniak category 2 cyst. This is faintly hyperintense on T2 weighted images. No current worrisome renal enhancement. Stomach/Bowel: Unremarkable Vascular/Lymphatic: Uphill paraesophageal varices. Gastric varices noted. No pathologic adenopathy identified. Other:  No supplemental non-categorized findings. Musculoskeletal: Lower lumbar spondylosis and degenerative disc disease. IMPRESSION: 1. Interval repeat left renal cryoablation with no residual enhancement at the site of suspected recurrence. Currently no significant abnormal enhancing renal lesion. 2. 1.3 cm Bosniak category 2 cyst of the left kidney upper pole. 3. Hepatic cirrhosis with uphill paraesophageal varices indicating portal venous hypertension. 4. Mild cardiomegaly. Electronically Signed   By:  WVan ClinesM.D.   On: 11/17/2020 19:12   DG Foot Complete Left  Result Date: 10/23/2020 Please see detailed radiograph report in office note.  DG Foot Complete Right  Result Date: 10/23/2020 Please see  detailed radiograph report in office note.   Labs:  CBC: Recent Labs    07/05/20 0700 07/06/20 0543 07/28/20 1226 11/02/20 1141  WBC 6.0 9.2  --  8.0  HGB 12.3* 11.1* 12.9* 12.0*  HCT 37.3* 33.8* 38.0* 35.9*  PLT 108* 91*  --  130.0*    COAGS: Recent Labs    05/24/20 1150 07/05/20 0700 11/02/20 1141  INR 1.1* 1.1 1.1*    BMP: Recent Labs    07/05/20 0700 07/06/20 0543 07/28/20 1226 11/02/20 1141  NA 133* 136 138 136  K 3.6 4.6 3.7 4.8  CL 99 103 100 101  CO2 22 20*  --  28  GLUCOSE 86 102* 87 92  BUN '14 20 23 18  '$ CALCIUM 9.2 9.2  --  9.8  CREATININE 1.74* 1.57* 2.00* 1.74*  GFRNONAA 44* 49*  --   --     LIVER FUNCTION TESTS: Recent Labs    07/05/20 0700 11/02/20 1141  BILITOT 1.1 0.8  AST 45* 22  ALT 32 17  ALKPHOS 104 107  PROT 7.3 7.6  ALBUMIN 4.1 4.3    TUMOR MARKERS: Recent Labs    05/24/20 1150 11/02/20 1141  AFPTM 8.9* 9.1*    Assessment and Plan:  79-monthstatus post repeat ablation of a left renal lesion compatible with renal cell carcinoma by imaging.  Local regional recurrence was successfully treated 07/05/2020.  He has done very well following the ablation.  Surveillance imaging demonstrates no residual abnormality or suspicious enhancement.  No delayed complication.  Plan[facility]    Electronically Signed: MGreggory Keen7/27/2022, 1:40 PM   I spent a total of    25 Minutes in remote  clinical consultation, greater than 50% of which was counseling/coordinating care for this patient status post left renal cryoablation.    Visit type: Audio only (telephone). Audio (no video) only due to patient's lack of internet/smartphone capability. [facility] [place] This visit type was conducted due to national recommendations for restrictions regarding the COVID-19 Pandemic (e.g. social distancing).  This format is felt to be most appropriate for this patient at this time.  All issues noted in this document were discussed and addressed.

## 2020-11-22 NOTE — H&P (View-Only) (Signed)
Patient ID: John [name], male   DOB: December 23, 1955, 65 y.o.   MRN: [id number]       Chief Complaint:  36-monthstatus post repeat left renal cryoablation  Referring Physician(s): [facility]  History of Present Illness: John HOPFis a 65y.o. male with a history of mild chronic renal insufficiency, hepatitis C, cirrhosis and portal hypertension.  He is known to our service because he has had a previous B RTO for bleeding gastric varices in 2016 he has undergone 2 left renal cryoablation's originally performed 2018 and then most recently 4 months ago.  Overall he is doing very well.  No recent illness or fever.  No urinary tract symptoms, hematuria or dysuria.  He is back to his baseline.  Surveillance MRI performed last week 11/16/2020.  This demonstrates no residual enhancement at the ablation site.  Overall expected findings posttreatment.  No complication.  Past Medical History:  Diagnosis Date   Allergy    Arthritis    oa back   Ascites    resolved since 2018   Asthma    Cataract    both eyes   [place] ([place]estive heart [place]) [place]   sees primary for   Chronic back pain    Chronic kidney disease    ckd 3   [place]osis [place] since [place]   Coronary artery disease    nonobstrucrtibe   Dyspnea    with heavy exertion   ETOH abuse    Gastric varices    GERD (gastroesophageal reflux disease)    Gout    Hepatitis    hepatitic c 2018 24 week tx with ecuplipsa, no hepatitic c detected after tx   History of blood transfusion 8-9- yrs ago   Hypertension    Optic neuropathy, left    PAD (peripheral artery disease) ([facility]    slight  primary manages   Polysubstance abuse ([place]    Renal cell carcinoma ([facility] 2016, 2018 and 2022   left ablation   Seizures ([facility]    age 32 none since   Sickle cell trait ([facility]    Stroke ([facility] 1998   "light no problems with"   Wears dentures    Wears glasses     Past Surgical History:  Procedure Laterality Date    COLONOSCOPY     ESOPHAGOGASTRODUODENOSCOPY N/A 08/14/2014   Procedure: ESOPHAGOGASTRODUODENOSCOPY (EGD);  Surgeon: MLadene Artist MD;  [facility];  Service: Endoscopy;  Laterality: N/A;   ESOPHAGOGASTRODUODENOSCOPY (EGD) WITH PROPOFOL N/A 06/03/2017   Procedure: ESOPHAGOGASTRODUODENOSCOPY (EGD) WITH PROPOFOL;  Surgeon: SLadene Artist MD;  [facility];  Service: Endoscopy;  Laterality: N/A;   HALLUX FUSION Left 07/28/2020   Procedure: HALLUX FUSION MPJ;  Surgeon: EEdrick Kins DPM;  [place] [facility]  Service: Podiatry;  Laterality: Left;   HAMMER TOE SURGERY Left 07/28/2020   Procedure: HAMMER TOE CORRECTION  2,3,AND4 LEFT FOOT;  Surgeon: EEdrick Kins DPM;  [place] [facility]  Service: Podiatry;  Laterality: Left;   IR RADIOLOGIST EVAL & MGMT  09/25/2016   IR RADIOLOGIST EVAL & MGMT  12/10/2016   IR RADIOLOGIST EVAL & MGMT  03/25/2018   IR RADIOLOGIST EVAL & MGMT  03/24/2019   IR RADIOLOGIST EVAL & MGMT  05/17/2020   IR RADIOLOGIST EVAL & MGMT  08/03/2020   METATARSAL HEAD EXCISION Left 07/28/2020   Procedure: METATARSAL HEAD EXCISION TOES 2,3,AND 4  LEFT FOOT;  Surgeon: EEdrick Kins DPM;  Location: [place];  Service: Podiatry;  Laterality: Left;   none     RADIOFREQUENCY ABLATION Left 11/08/2016   Procedure: LEFT RENAL CRYOABLATION;  Surgeon: Greggory Keen, MD;  [facility];  Service: Anesthesiology;  Laterality: Left;   RADIOLOGY WITH ANESTHESIA N/A 08/15/2014   Procedure: RADIOLOGY WITH ANESTHESIA;  Surgeon: Greggory Keen, MD;  Location: [place];  Service: Radiology;  Laterality: N/A;   RADIOLOGY WITH ANESTHESIA Left 07/05/2020   Procedure: CT CRYOABLATION;  Surgeon: Greggory Keen, MD;  [facility];  Service: Anesthesiology;  Laterality: Left;   UPPER GASTROINTESTINAL ENDOSCOPY      Allergies: Penicillins  Medications: Prior to Admission medications   Medication Sig Start Date End Date Taking?  Authorizing Provider  allopurinol (ZYLOPRIM) 100 MG tablet Take 1 tablet (100 mg total) by mouth daily. 05/12/18   Eugenie Filler, MD  amLODipine (NORVASC) 10 MG tablet Take 1 tablet (10 mg total) by mouth daily. 05/25/15   Debbe Odea, MD  atorvastatin (LIPITOR) 20 MG tablet Take 1 tablet by mouth daily. 03/09/20   [provider]  atorvastatin (LIPITOR) 40 MG tablet Take 40 mg by mouth daily. 04/29/19   [provider]  Cholecalciferol (VITAMIN D) 50 MCG (2000 UT) CAPS Take 2,000 Units by mouth daily.    [provider]  colchicine 0.6 MG tablet Take 1 tablet (0.6 mg total) by mouth daily. 05/07/18   Eugenie Filler, MD  FLOVENT HFA 220 MCG/ACT inhaler Inhale 2 puffs into the lungs in the morning and at bedtime. 01/27/19   [provider]  folic acid (FOLVITE) 1 MG tablet Take 1 tablet (1 mg total) by mouth daily. 05/07/18   Eugenie Filler, MD  furosemide (LASIX) 40 MG tablet Take 40 mg by mouth daily.    [provider]  hydrALAZINE (APRESOLINE) 50 MG tablet Take 1 tablet (50 mg total) by mouth 3 (three) times daily. Patient taking differently: Take 50 mg by mouth in the morning and at bedtime. 05/25/15   Debbe Odea, MD  ketoconazole (NIZORAL) 2 % shampoo Apply 1 application topically See admin instructions. Prescribed 06/26/20: Apply topically in shower once every 3 days as needed for itching for 8 weeks.    [provider]  lactulose (CHRONULAC) 10 GM/15ML solution Take 20 g by mouth daily as needed for mild constipation or moderate constipation. 11/01/16   [provider]  omeprazole (PRILOSEC) 40 MG capsule TAKE ONE CAPSULE BY MOUTH DAILY Patient taking differently: Take 40 mg by mouth daily as needed (acid reflux). 07/02/18   Ladene Artist, MD  PROAIR HFA 108 [phone] Base) MCG/ACT inhaler Inhale 2 puffs into the lungs every 6 (six) hours as needed for wheezing or shortness of breath. 10/02/17   [provider]   spironolactone (ALDACTONE) 25 MG tablet Take 25 mg by mouth daily as needed (swelling). 06/13/18   [provider]     Family History  Problem Relation Age of Onset   Hypertension Mother        Living   Kidney disease Mother    Diabetes Mother    Heart disease Mother    Hypertension Father        Deceased, 58   Ulcers Father    Stomach cancer Father    Hypertension Brother    Diabetes Brother    Kidney disease Brother    Hypertension Sister    Colon cancer Neg Hx    Esophageal cancer Neg Hx  Rectal cancer Neg Hx     Social History   Socioeconomic History   Marital status: Single    Spouse name: Not on file   Number of children: 0   Years of education: Not on file   Highest education level: Not on file  Occupational History   Occupation: retired  Tobacco Use   Smoking status: Former    Packs/day: 0.30    Years: 20.00    Pack years: 6.00    Types: Cigarettes   Smokeless tobacco: Never   Tobacco comments:    2015  Vaping Use   Vaping Use: Never used  Substance and Sexual Activity   Alcohol use: No    Alcohol/week: 0.0 standard drinks    Comment: stopped 8-9 yrs ago   Drug use: Yes    Types: Cocaine, Marijuana    Comment:  smoked one joint laced with cocaine on  06/27/19   Sexual activity: Not on file  Other Topics Concern   Not on file  Social History Narrative   Lives with niece in a 2 story home.     On disability since 2015 for low back pain.  Used to work Statistician.    Highest level of education: 11th grade   Social Determinants of Health   Financial Resource Strain: Not on file  Food Insecurity: Not on file  Transportation Needs: Not on file  Physical Activity: Not on file  Stress: Not on file  Social Connections: Not on file     Review of Systems  Review of Systems: A 12 point ROS discussed and pertinent positives are indicated in the HPI above.  All other systems are negative.  Physical Exam No direct physical exam was  performed telephone health visit only today because of COVID pandemic Vital Signs: There were no vitals taken for this visit.  Imaging: MR ABDOMEN WWO CONTRAST  Result Date: 11/17/2020 CLINICAL DATA:  Left renal mass with prior cryoablation and repeat cryoablation. EXAM: MRI ABDOMEN WITHOUT AND WITH CONTRAST TECHNIQUE: Multiplanar multisequence MR imaging of the abdomen was performed both before and after the administration of intravenous contrast. CONTRAST:  35m GADAVIST GADOBUTROL 1 MMOL/ML IV SOLN COMPARISON:  Multiple exams, including 05/09/2020 FINDINGS: Lower chest: Mild cardiomegaly.  Uphill paraesophageal varices. Hepatobiliary: Hepatic cirrhosis. Contracted gallbladder. No biliary dilatation. Pancreas:  Unremarkable Spleen:  Unremarkable Adrenals/Urinary Tract: Both adrenal glands appear normal. Small hypodense right renal lesion is compatible with tiny cysts. Cryoablation extension noted with no current enhancement at the prior recurrent site, compatible with successful retreatment. 1.3 by 1.3 cm complex lesion in the left kidney upper pole anteriorly with accentuated precontrast T1 signal characteristics but no definite enhancement, favoring a Bosniak category 2 cyst. This is faintly hyperintense on T2 weighted images. No current worrisome renal enhancement. Stomach/Bowel: Unremarkable Vascular/Lymphatic: Uphill paraesophageal varices. Gastric varices noted. No pathologic adenopathy identified. Other:  No supplemental non-categorized findings. Musculoskeletal: Lower lumbar spondylosis and degenerative disc disease. IMPRESSION: 1. Interval repeat left renal cryoablation with no residual enhancement at the site of suspected recurrence. Currently no significant abnormal enhancing renal lesion. 2. 1.3 cm Bosniak category 2 cyst of the left kidney upper pole. 3. Hepatic cirrhosis with uphill paraesophageal varices indicating portal venous hypertension. 4. Mild cardiomegaly. Electronically Signed   By:  WVan ClinesM.D.   On: 11/17/2020 19:12   DG Foot Complete Left  Result Date: 10/23/2020 Please see detailed radiograph report in office note.  DG Foot Complete Right  Result Date: 10/23/2020 Please see  detailed radiograph report in office note.   Labs:  CBC: Recent Labs    07/05/20 0700 07/06/20 0543 07/28/20 1226 11/02/20 1141  WBC 6.0 9.2  --  8.0  HGB 12.3* 11.1* 12.9* 12.0*  HCT 37.3* 33.8* 38.0* 35.9*  PLT 108* 91*  --  130.0*    COAGS: Recent Labs    05/24/20 1150 07/05/20 0700 11/02/20 1141  INR 1.1* 1.1 1.1*    BMP: Recent Labs    07/05/20 0700 07/06/20 0543 07/28/20 1226 11/02/20 1141  NA 133* 136 138 136  K 3.6 4.6 3.7 4.8  CL 99 103 100 101  CO2 22 20*  --  28  GLUCOSE 86 102* 87 92  BUN '14 20 23 18  '$ CALCIUM 9.2 9.2  --  9.8  CREATININE 1.74* 1.57* 2.00* 1.74*  GFRNONAA 44* 49*  --   --     LIVER FUNCTION TESTS: Recent Labs    07/05/20 0700 11/02/20 1141  BILITOT 1.1 0.8  AST 45* 22  ALT 32 17  ALKPHOS 104 107  PROT 7.3 7.6  ALBUMIN 4.1 4.3    TUMOR MARKERS: Recent Labs    05/24/20 1150 11/02/20 1141  AFPTM 8.9* 9.1*    Assessment and Plan:  69-monthstatus post repeat ablation of a left renal lesion compatible with renal cell carcinoma by imaging.  Local regional recurrence was successfully treated 07/05/2020.  He has done very well following the ablation.  Surveillance imaging demonstrates no residual abnormality or suspicious enhancement.  No delayed complication.  [facility]    Electronically Signed: MGreggory Keen7/27/2022, 1:40 PM   I spent a total of    25 Minutes in remote  clinical consultation, greater than 50% of which was counseling/coordinating care for this patient status post left renal cryoablation.    Visit type: Audio only (telephone). Audio (no video) only due to patient's lack of internet/smartphone capability. Alternative  [facility] [place] This visit type was conducted due to national recommendations for restrictions regarding the COVID-19 Pandemic (e.g. social distancing).  This format is felt to be most appropriate for this patient at this time.  All issues noted in this document were discussed and addressed.

## 2020-12-04 ENCOUNTER — Other Ambulatory Visit: Payer: Self-pay

## 2020-12-04 ENCOUNTER — Encounter (HOSPITAL_BASED_OUTPATIENT_CLINIC_OR_DEPARTMENT_OTHER): Payer: Self-pay | Admitting: Podiatry

## 2020-12-04 DIAGNOSIS — Z9989 Dependence on other enabling machines and devices: Secondary | ICD-10-CM

## 2020-12-04 HISTORY — DX: Dependence on other enabling machines and devices: Z99.89

## 2020-12-04 NOTE — Progress Notes (Addendum)
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  Clear liquids from MN until---830 am then npo Med rec completed Medications to take morning of surgery -----amlodipine, hydrazaline omeprazole, lipitor, flovent and proair inhalers prn and bring inhalers Diabetic medication -----n/a Patient instructed no nail polish to be worn day of surgery Patient instructed to bring photo id and insurance card day of surgery Patient aware to have Driver (ride ) / caregiver  pt comigng with pace Lucianne Lei and niece shamieka Kindig  cell [phone] will follow pace can and be caregiver after surgery for 24 hours after surgery  Patient Special Instructions -----none Pre-Op special Istructions -----surgery orders req dr Amalia Hailey epic ib Patient verbalized understanding of instructions that were given at this phone interview. Patient denies shortness of breath, chest pain, fever, cough at this phone interview.   Dr  Jaquelyn Bitter oddono mda reviewed pt chart and history for 07-28-2020 [facility] surgery and pt ok for 07-28-2020 surgery'@[facility]'$  per dr Jaquelyn Bitter  oddono mda  Lov dr Hassell Done  [facility] 07-03-2020 on chart  [facility] & [facility]ly brown dnp dated 11-16-2020 on chart for 12-07-2020 surgery Ekg 07-05-2020 epic Echo 07-14-2015 epic

## 2020-12-06 NOTE — Progress Notes (Signed)
Spoke with Darrick Penna, surgical coordinator for Dr. Amalia Hailey, requesting orders for patient.

## 2020-12-07 ENCOUNTER — Encounter (HOSPITAL_BASED_OUTPATIENT_CLINIC_OR_DEPARTMENT_OTHER)
Admission: RE | Disposition: A | Discharge status: Home / Self Care | Payer: Self-pay | Source: Home / Self Care | Attending: Podiatry

## 2020-12-07 ENCOUNTER — Encounter: Payer: Self-pay | Admitting: Podiatry

## 2020-12-07 ENCOUNTER — Encounter (HOSPITAL_BASED_OUTPATIENT_CLINIC_OR_DEPARTMENT_OTHER): Payer: Self-pay | Admitting: Podiatry

## 2020-12-07 ENCOUNTER — Ambulatory Visit (HOSPITAL_BASED_OUTPATIENT_CLINIC_OR_DEPARTMENT_OTHER)
Admission: RE | Admit: 2020-12-07 | Discharge: 2020-12-07 | Disposition: A | Discharge status: Home / Self Care | Payer: Medicaid Other | Attending: Podiatry | Admitting: Podiatry

## 2020-12-07 ENCOUNTER — Other Ambulatory Visit: Payer: Self-pay

## 2020-12-07 ENCOUNTER — Ambulatory Visit (HOSPITAL_BASED_OUTPATIENT_CLINIC_OR_DEPARTMENT_OTHER): Payer: Medicaid Other | Admitting: Anesthesiology

## 2020-12-07 DIAGNOSIS — I5022 Chronic systolic (congestive) heart failure: Secondary | ICD-10-CM | POA: Insufficient documentation

## 2020-12-07 DIAGNOSIS — M2011 Hallux valgus (acquired), right foot: Secondary | ICD-10-CM

## 2020-12-07 DIAGNOSIS — Z87891 Personal history of nicotine dependence: Secondary | ICD-10-CM | POA: Diagnosis not present

## 2020-12-07 DIAGNOSIS — Z79899 Other long term (current) drug therapy: Secondary | ICD-10-CM | POA: Insufficient documentation

## 2020-12-07 DIAGNOSIS — M2041 Other hammer toe(s) (acquired), right foot: Secondary | ICD-10-CM | POA: Insufficient documentation

## 2020-12-07 DIAGNOSIS — Z880 Allergy status to penicillin: Secondary | ICD-10-CM | POA: Diagnosis not present

## 2020-12-07 DIAGNOSIS — K7469 Other cirrhosis of liver: Secondary | ICD-10-CM | POA: Diagnosis not present

## 2020-12-07 DIAGNOSIS — I130 Hypertensive heart and chronic kidney disease with heart failure and stage 1 through stage 4 chronic kidney disease, or unspecified chronic kidney disease: Secondary | ICD-10-CM | POA: Diagnosis not present

## 2020-12-07 DIAGNOSIS — K7030 Alcoholic cirrhosis of liver without ascites: Secondary | ICD-10-CM | POA: Diagnosis not present

## 2020-12-07 DIAGNOSIS — B1920 Unspecified viral hepatitis C without hepatic coma: Secondary | ICD-10-CM | POA: Diagnosis not present

## 2020-12-07 DIAGNOSIS — N183 Chronic kidney disease, stage 3 unspecified: Secondary | ICD-10-CM | POA: Diagnosis not present

## 2020-12-07 DIAGNOSIS — K766 Portal hypertension: Secondary | ICD-10-CM | POA: Insufficient documentation

## 2020-12-07 HISTORY — PX: HAMMER TOE SURGERY: SHX385

## 2020-12-07 HISTORY — PX: ARTHRODESIS METATARSALPHALANGEAL JOINT (MTPJ): SHX6566

## 2020-12-07 HISTORY — PX: METATARSAL OSTEOTOMY: SHX1641

## 2020-12-07 LAB — RAPID URINE DRUG SCREEN, HOSP PERFORMED
Amphetamines: NOT DETECTED
Barbiturates: NOT DETECTED
Benzodiazepines: NOT DETECTED
Cocaine: NOT DETECTED
Opiates: NOT DETECTED
Tetrahydrocannabinol: NOT DETECTED

## 2020-12-07 LAB — POCT I-STAT, CHEM 8
BUN: 18 mg/dL (ref 8–23)
Calcium, Ion: 1.22 mmol/L (ref 1.15–1.40)
Chloride: 103 mmol/L (ref 98–111)
Creatinine, Ser: 1.6 mg/dL — ABNORMAL HIGH (ref 0.61–1.24)
Glucose, Bld: 93 mg/dL (ref 70–99)
HCT: 42 % (ref 39.0–52.0)
Hemoglobin: 14.3 g/dL (ref 13.0–17.0)
Potassium: 4.1 mmol/L (ref 3.5–5.1)
Sodium: 139 mmol/L (ref 135–145)
TCO2: 24 mmol/L (ref 22–32)

## 2020-12-07 SURGERY — OSTEOTOMY, METATARSAL BONE
Anesthesia: General | Site: Toe | Laterality: Right

## 2020-12-07 MED ORDER — CLINDAMYCIN PHOSPHATE 900 MG/50ML IV SOLN
INTRAVENOUS | Status: AC
  Filled 2020-12-07: qty 50

## 2020-12-07 MED ORDER — FENTANYL CITRATE (PF) 100 MCG/2ML IJ SOLN
INTRAMUSCULAR | Status: AC
  Filled 2020-12-07: qty 2

## 2020-12-07 MED ORDER — ACETAMINOPHEN 325 MG PO TABS
ORAL_TABLET | ORAL | Status: AC
  Filled 2020-12-07: qty 2

## 2020-12-07 MED ORDER — ONDANSETRON HCL 4 MG/2ML IJ SOLN
INTRAMUSCULAR | Status: AC
  Filled 2020-12-07: qty 2

## 2020-12-07 MED ORDER — LIDOCAINE 2% (20 MG/ML) 5 ML SYRINGE
INTRAMUSCULAR | Status: DC | PRN
  Administered 2020-12-07: 40 mg via INTRAVENOUS

## 2020-12-07 MED ORDER — OXYCODONE HCL 5 MG/5ML PO SOLN
5.0000 mg | Freq: Once | ORAL | Status: AC | PRN
Start: 2020-12-07 — End: 2020-12-07

## 2020-12-07 MED ORDER — DOXYCYCLINE HYCLATE 100 MG PO TABS: 100.0000 mg | ORAL_TABLET | Freq: Two times a day (BID) | ORAL | 0 refills | Status: DC

## 2020-12-07 MED ORDER — BUPIVACAINE-EPINEPHRINE (PF) 0.5% -1:200000 IJ SOLN
INTRAMUSCULAR | Status: DC | PRN
  Administered 2020-12-07: 30 mL via PERINEURAL

## 2020-12-07 MED ORDER — FENTANYL CITRATE (PF) 100 MCG/2ML IJ SOLN
INTRAMUSCULAR | Status: DC | PRN
  Administered 2020-12-07: 50 ug via INTRAVENOUS
  Administered 2020-12-07 (×2): 25 ug via INTRAVENOUS
  Administered 2020-12-07: 50 ug via INTRAVENOUS

## 2020-12-07 MED ORDER — MIDAZOLAM HCL 5 MG/5ML IJ SOLN
INTRAMUSCULAR | Status: DC | PRN
  Administered 2020-12-07: 1 mg via INTRAVENOUS

## 2020-12-07 MED ORDER — PROPOFOL 10 MG/ML IV BOLUS
INTRAVENOUS | Status: AC
  Filled 2020-12-07: qty 20

## 2020-12-07 MED ORDER — ACETAMINOPHEN 10 MG/ML IV SOLN: 1000.0000 mg | Freq: Once | INTRAVENOUS | Status: DC | PRN

## 2020-12-07 MED ORDER — AMISULPRIDE (ANTIEMETIC) 5 MG/2ML IV SOLN: 10.0000 mg | Freq: Once | INTRAVENOUS | Status: DC | PRN

## 2020-12-07 MED ORDER — MIDAZOLAM HCL 2 MG/2ML IJ SOLN
2.0000 mg | Freq: Once | INTRAMUSCULAR | Status: AC
  Administered 2020-12-07: 2 mg via INTRAVENOUS

## 2020-12-07 MED ORDER — OXYCODONE-ACETAMINOPHEN 5-325 MG PO TABS: 1.0000 | ORAL_TABLET | ORAL | 0 refills | Status: DC | PRN

## 2020-12-07 MED ORDER — DEXAMETHASONE SODIUM PHOSPHATE 10 MG/ML IJ SOLN
INTRAMUSCULAR | Status: DC | PRN
  Administered 2020-12-07: 10 mg via INTRAVENOUS

## 2020-12-07 MED ORDER — ACETAMINOPHEN 325 MG PO TABS
325.0000 mg | ORAL_TABLET | ORAL | Status: DC | PRN
  Administered 2020-12-07: 650 mg via ORAL

## 2020-12-07 MED ORDER — EPHEDRINE 5 MG/ML INJ
INTRAVENOUS | Status: AC
  Filled 2020-12-07: qty 5

## 2020-12-07 MED ORDER — FENTANYL CITRATE (PF) 100 MCG/2ML IJ SOLN
50.0000 ug | Freq: Once | INTRAMUSCULAR | Status: AC
  Administered 2020-12-07: 50 ug via INTRAVENOUS

## 2020-12-07 MED ORDER — CLINDAMYCIN PHOSPHATE 900 MG/50ML IV SOLN
900.0000 mg | INTRAVENOUS | Status: AC
  Administered 2020-12-07: 900 mg via INTRAVENOUS

## 2020-12-07 MED ORDER — ROPIVACAINE HCL 7.5 MG/ML IJ SOLN
INTRAMUSCULAR | Status: DC | PRN
  Administered 2020-12-07: 15 mL via PERINEURAL

## 2020-12-07 MED ORDER — MIDAZOLAM HCL 2 MG/2ML IJ SOLN
INTRAMUSCULAR | Status: AC
  Filled 2020-12-07: qty 2

## 2020-12-07 MED ORDER — PROMETHAZINE HCL 25 MG/ML IJ SOLN: 6.2500 mg | INTRAMUSCULAR | Status: DC | PRN

## 2020-12-07 MED ORDER — ACETAMINOPHEN 160 MG/5ML PO SOLN: 325.0000 mg | ORAL | Status: DC | PRN

## 2020-12-07 MED ORDER — OXYCODONE HCL 5 MG PO TABS
ORAL_TABLET | ORAL | Status: AC
  Filled 2020-12-07: qty 1

## 2020-12-07 MED ORDER — ONDANSETRON HCL 4 MG/2ML IJ SOLN
INTRAMUSCULAR | Status: DC | PRN
  Administered 2020-12-07: 4 mg via INTRAVENOUS

## 2020-12-07 MED ORDER — SODIUM CHLORIDE 0.9 % IV SOLN: INTRAVENOUS | Status: DC

## 2020-12-07 MED ORDER — FENTANYL CITRATE (PF) 100 MCG/2ML IJ SOLN
25.0000 ug | INTRAMUSCULAR | Status: DC | PRN
  Administered 2020-12-07: 50 ug via INTRAVENOUS

## 2020-12-07 MED ORDER — EPHEDRINE SULFATE-NACL 50-0.9 MG/10ML-% IV SOSY
PREFILLED_SYRINGE | INTRAVENOUS | Status: DC | PRN
  Administered 2020-12-07: 10 mg via INTRAVENOUS
  Administered 2020-12-07: 5 mg via INTRAVENOUS

## 2020-12-07 MED ORDER — OXYCODONE HCL 5 MG PO TABS
5.0000 mg | ORAL_TABLET | Freq: Once | ORAL | Status: AC | PRN
Start: 2020-12-07 — End: 2020-12-07
  Administered 2020-12-07: 5 mg via ORAL

## 2020-12-07 MED ORDER — PROPOFOL 10 MG/ML IV BOLUS
INTRAVENOUS | Status: DC | PRN
  Administered 2020-12-07: 40 mg via INTRAVENOUS
  Administered 2020-12-07: 200 mg via INTRAVENOUS
  Administered 2020-12-07: 20 mg via INTRAVENOUS

## 2020-12-07 MED ORDER — DEXAMETHASONE SODIUM PHOSPHATE 10 MG/ML IJ SOLN
INTRAMUSCULAR | Status: AC
  Filled 2020-12-07: qty 1

## 2020-12-07 MED ORDER — 0.9 % SODIUM CHLORIDE (POUR BTL) OPTIME
TOPICAL | Status: DC | PRN
  Administered 2020-12-07: 500 mL

## 2020-12-07 SURGICAL SUPPLY — 73 items
BANDAGE ACE 3X5.8 VEL STRL LF (GAUZE/BANDAGES/DRESSINGS) ×2 IMPLANT
BLADE AVERAGE 25X9 (BLADE) ×2 IMPLANT
BLADE SURG 15 STRL LF DISP TIS (BLADE) ×6 IMPLANT
BLADE SURG 15 STRL SS (BLADE) ×6
BNDG ELASTIC 4X5.8 VLCR STR LF (GAUZE/BANDAGES/DRESSINGS) ×2 IMPLANT
BNDG ESMARK 4X9 LF (GAUZE/BANDAGES/DRESSINGS) ×2 IMPLANT
BNDG GAUZE ELAST 4 BULKY (GAUZE/BANDAGES/DRESSINGS) ×2 IMPLANT
CHLORAPREP W/TINT 26 (MISCELLANEOUS) ×2 IMPLANT
COVER BACK TABLE 60X90IN (DRAPES) ×2 IMPLANT
CUFF TOURN SGL QUICK 18X4 (TOURNIQUET CUFF) ×2 IMPLANT
CUFF TOURN SGL QUICK 34 (TOURNIQUET CUFF)
CUFF TRNQT CYL 34X4.125X (TOURNIQUET CUFF) IMPLANT
CrossRoads DynaForce Plate Sterile Instrument Kit ×2 IMPLANT
DRAPE C-ARM 35X43 STRL (DRAPES) IMPLANT
DRAPE EXTREMITY T 121X128X90 (DISPOSABLE) ×2 IMPLANT
DRAPE IMP U-DRAPE 54X76 (DRAPES) IMPLANT
DRAPE SHEET LG 3/4 BI-LAMINATE (DRAPES) IMPLANT
DRAPE SURG 17X23 STRL (DRAPES) IMPLANT
DRAPE U-SHAPE 47X51 STRL (DRAPES) IMPLANT
DRSG PAD ABDOMINAL 8X10 ST (GAUZE/BANDAGES/DRESSINGS) IMPLANT
ELECT REM PT RETURN 9FT ADLT (ELECTROSURGICAL) ×2
ELECTRODE REM PT RTRN 9FT ADLT (ELECTROSURGICAL) ×1 IMPLANT
GAUZE 4X4 16PLY ~~LOC~~+RFID DBL (SPONGE) ×2 IMPLANT
GAUZE SPONGE 4X4 12PLY STRL (GAUZE/BANDAGES/DRESSINGS) ×2 IMPLANT
GAUZE XEROFORM 1X8 LF (GAUZE/BANDAGES/DRESSINGS) ×4 IMPLANT
GLOVE SRG 8 PF TXTR STRL LF DI (GLOVE) ×1 IMPLANT
GLOVE SURG ENC MOIS LTX SZ7.5 (GLOVE) IMPLANT
GLOVE SURG ENC MOIS LTX SZ8 (GLOVE) ×2 IMPLANT
GLOVE SURG UNDER POLY LF SZ8 (GLOVE) ×1
GOWN STRL REUS W/TWL LRG LVL3 (GOWN DISPOSABLE) ×4 IMPLANT
GOWN STRL REUS W/TWL XL LVL3 (GOWN DISPOSABLE) ×4 IMPLANT
K-WIRE .045X4 (WIRE)
K-WIRE CAPS STERILE WHITE .045 (WIRE) ×2 IMPLANT
K-WIRE DBL END TROCAR 6X.062 (WIRE) ×6
K-WIRE SURGICAL 1.6X102 (WIRE) ×2 IMPLANT
K-Wire 0.062" ×6 IMPLANT
KIT INSTRUMENT DYNAFORCE PLATE (KITS) ×2 IMPLANT
KIT TURNOVER CYSTO (KITS) ×2 IMPLANT
KWIRE .045X4 (WIRE) IMPLANT
KWIRE DBL END TROCAR 6X.062 (WIRE) ×3 IMPLANT
NDL SAFETY ECLIPSE 18X1.5 (NEEDLE) IMPLANT
NEEDLE HYPO 18GX1.5 SHARP (NEEDLE)
NEEDLE HYPO 25X1 1.5 SAFETY (NEEDLE) ×4 IMPLANT
NS IRRIG 1000ML POUR BTL (IV SOLUTION) IMPLANT
PACK BASIN DAY SURGERY FS (CUSTOM PROCEDURE TRAY) ×2 IMPLANT
PADDING CAST ABS 4INX4YD NS (CAST SUPPLIES) ×2
PADDING CAST ABS COTTON 4X4 ST (CAST SUPPLIES) ×2 IMPLANT
PENCIL SMOKE EVACUATOR (MISCELLANEOUS) ×2 IMPLANT
PLATE MTP STANDARD 18 (Plate) ×2 IMPLANT
PLATE MTP STD 18 (Plate) ×1 IMPLANT
REAMER CUP/CONE DYNAFORCE 20 (MISCELLANEOUS) ×2 IMPLANT
SCREW LOCK POLY 3.5X18 (Screw) ×2 IMPLANT
SCREW LOCKING POLYAXIAL 3.5X16 (Screw) ×2 IMPLANT
SCREW POLYAXIL LOCK 3.5X12 (Screw) ×4 IMPLANT
SPLINT FIBERGLASS 4X30 (CAST SUPPLIES) ×2 IMPLANT
STAPLE BONE KIT 18X14X14 (Staple) ×2 IMPLANT
STAPLER VISISTAT (STAPLE) ×2 IMPLANT
STAPLER VISISTAT 35W (STAPLE) IMPLANT
STOCKINETTE 6  STRL (DRAPES) ×1
STOCKINETTE 6 STRL (DRAPES) ×1 IMPLANT
SUCTION FRAZIER HANDLE 10FR (MISCELLANEOUS) ×1
SUCTION TUBE FRAZIER 10FR DISP (MISCELLANEOUS) ×1 IMPLANT
SUT MNCRL AB 3-0 PS2 18 (SUTURE) IMPLANT
SUT MNCRL AB 4-0 PS2 18 (SUTURE) IMPLANT
SUT MON AB 5-0 PS2 18 (SUTURE) IMPLANT
SUT PROLENE 4 0 PS 2 18 (SUTURE) ×2 IMPLANT
SUT VIC AB 4-0 P2 18 (SUTURE) IMPLANT
SUT VIC AB 4-0 PS2 27 (SUTURE) ×4 IMPLANT
SYR BULB EAR ULCER 3OZ GRN STR (SYRINGE) ×2 IMPLANT
SYR CONTROL 10ML LL (SYRINGE) ×4 IMPLANT
TOWEL OR 17X26 10 PK STRL BLUE (TOWEL DISPOSABLE) ×2 IMPLANT
TUBE CONNECTING 12X1/4 (SUCTIONS) ×2 IMPLANT
UNDERPAD 30X36 HEAVY ABSORB (UNDERPADS AND DIAPERS) ×2 IMPLANT

## 2020-12-07 NOTE — Progress Notes (Signed)
Orthopedic Tech Progress Note Patient Details:  John [name] [birth year] [id number]  Ortho Devices Type of Ortho Device: CAM walker Ortho Device/Splint Location: right Ortho Device/Splint Interventions: Application   Post Interventions Patient Tolerated: Well Instructions Provided: Care of device  [name] 12/07/2020, 12:36 PM

## 2020-12-07 NOTE — Anesthesia Preprocedure Evaluation (Addendum)
Anesthesia Evaluation  Patient identified by MRN, date of birth, ID band Patient awake    Reviewed: Allergy & Precautions, NPO status , Patient's Chart, lab work & pertinent test results  Airway Mallampati: I  TM Distance: >3 FB Neck ROM: Full    Dental  (+) Upper Dentures, Lower Dentures   Pulmonary asthma , former smoker,    breath sounds clear to auscultation       Cardiovascular hypertension, Pt. on medications + CAD, + Peripheral Vascular Disease and +CHF   Rhythm:Regular Rate:Normal  Echo: - Left ventricle: The cavity size was normal. Wall thickness was  increased in a pattern of moderate LVH. Systolic function was  vigorous. The estimated ejection fraction was in the range of 70%  to 75%. Wall motion was normal; there were no regional wall  motion abnormalities. Doppler parameters are consistent with  abnormal left ventricular relaxation (grade 1 diastolic  dysfunction).  - Aortic valve: Mildly calcified annulus. Trileaflet.  - Aortic root: The aortic root was mildly dilated.  - Mitral valve: Mild calcification of papillary tips.  - Left atrium: The atrium was mildly dilated.  - Right atrium: Central venous pressure (est): 3 mm Hg.  - Atrial septum: No definite secundum ASD or patent foramen ovale  was identified. Limited images of agitated saline study do not  indicate right to left shunting.  - Tricuspid valve: There was physiologic regurgitation.  - Pulmonary arteries: Systolic pressure could not be accurately  estimated.  - Pericardium, extracardiac: There was no pericardial effusion.    Neuro/Psych Seizures -,  PSYCHIATRIC DISORDERS CVA    GI/Hepatic GERD  Medicated,(+) Hepatitis -, C  Endo/Other  negative endocrine ROS  Renal/GU Renal disease     Musculoskeletal  (+) Arthritis ,   Abdominal Normal abdominal exam  (+)   Peds  Hematology negative hematology ROS (+) Sickle cell  trait ,   Anesthesia Other Findings   Reproductive/Obstetrics                            Anesthesia Physical Anesthesia Plan  ASA: 3  Anesthesia Plan: General   Post-op Pain Management: GA combined w/ Regional for post-op pain   Induction: Intravenous  PONV Risk Score and Plan: 3 and Ondansetron, Dexamethasone and Midazolam  Airway Management Planned: LMA  Additional Equipment: None  Intra-op Plan:   Post-operative Plan: Extubation in OR  Informed Consent: I have reviewed the patients History and Physical, chart, labs and discussed the procedure including the risks, benefits and alternatives for the proposed anesthesia with the patient or authorized representative who has indicated his/her understanding and acceptance.     Dental advisory given  Plan Discussed with: CRNA  Anesthesia Plan Comments:         Anesthesia Quick Evaluation

## 2020-12-07 NOTE — Interval H&P Note (Signed)
History and Physical Interval Note:  12/07/2020 9:55 AM  John [name]  has presented today for surgery, with the diagnosis of [place].  The various methods of treatment have been discussed with the patient and family. After consideration of risks, benefits and other options for treatment, the patient has consented to  Procedure(s): METATARSAL OSTEOTOMY TOES 2-4 RIGHT FOOT (Right) HAMMER TOE CORRECTION  2-4 (Right) ARTHRODESIS METATARSALPHALANGEAL JOINT (MTPJ) (Right) as a surgical intervention.  The patient's history has been reviewed, patient examined, no change in status, stable for surgery.  I have reviewed the patient's chart and labs.  Questions were answered to the patient's satisfaction.     Edrick Kins

## 2020-12-07 NOTE — Transfer of Care (Signed)
Immediate Anesthesia Transfer of Care Note  Patient: John [name]  Procedure(s) Performed: METATARSAL OSTEOTOMY TOES 2-4 RIGHT FOOT (Right: Toe) HAMMER TOE CORRECTION  2-4 (Right: Toe) ARTHRODESIS METATARSALPHALANGEAL JOINT (MTPJ) (Right: Toe)  Patient Location: PACU  Anesthesia Type:General and Regional  Level of Consciousness: awake, alert , oriented and patient cooperative  Airway & Oxygen Therapy: Patient Spontanous Breathing  Post-op Assessment: Report given to RN and Post -op Vital signs reviewed and stable  Post vital signs: Reviewed and stable  Last Vitals:  Vitals Value Taken Time  BP 136/83 12/07/20 1301  Temp    Pulse 81 12/07/20 1303  Resp 22 12/07/20 1303  SpO2 98 % 12/07/20 1303  Vitals shown include unvalidated device data.  Last Pain:  Vitals:   12/07/20 1029  PainSc: 0-No pain      Patients Stated Pain Goal: 5 ([id number])  Complications: No notable events documented.

## 2020-12-07 NOTE — Anesthesia Procedure Notes (Signed)
Anesthesia Regional Block: Adductor canal block   Pre-Anesthetic Checklist: , timeout performed,  Correct Patient, Correct Site, Correct Laterality,  Correct Procedure, Correct Position, site marked,  Risks and benefits discussed,  Surgical consent,  Pre-op evaluation,  At surgeon's request and post-op pain management  Laterality: Right  Prep: chloraprep       Needles:  Injection technique: Single-shot  Needle Type: Echogenic Stimulator Needle     Needle Length: 9cm  Needle Gauge: 21     Additional Needles:   Procedures:,,,, ultrasound used (permanent image in chart),,    Narrative:  Start time: 12/07/2020 11:10 AM End time: 12/07/2020 11:15 AM Injection made incrementally with aspirations every 5 mL.  Performed by: Personally  Anesthesiologist: Effie Berkshire, MD  Additional Notes: Patient tolerated the procedure well. Local anesthetic introduced in an incremental fashion under minimal resistance after negative aspirations. No paresthesias were elicited. After completion of the procedure, no acute issues were identified and patient continued to be monitored by RN.

## 2020-12-07 NOTE — Anesthesia Procedure Notes (Signed)
Procedure Name: LMA Insertion Date/Time: 12/07/2020 11:24 AM Performed by: Rogers Blocker, CRNA Pre-anesthesia Checklist: Patient identified, Emergency Drugs available, Suction available and Patient being monitored Patient Re-evaluated:Patient Re-evaluated prior to induction Oxygen Delivery Method: Circle System Utilized Preoxygenation: Pre-oxygenation with 100% oxygen Induction Type: IV induction Ventilation: Mask ventilation without difficulty LMA: LMA inserted LMA Size: 5.0 Number of attempts: 1 Placement Confirmation: positive ETCO2 Tube secured with: Tape Dental Injury: Teeth and Oropharynx as per pre-operative assessment

## 2020-12-07 NOTE — Discharge Instructions (Signed)
  Post Anesthesia Home Care Instructions  Activity: Get plenty of rest for the remainder of the day. A responsible individual must stay with you for 24 hours following the procedure.  For the next 24 hours, DO NOT: -Drive a car -Operate machinery -Drink alcoholic beverages -Take any medication unless instructed by your physician -Make any legal decisions or sign important papers.  Meals: Start with liquid foods such as gelatin or soup. Progress to regular foods as tolerated. Avoid greasy, spicy, heavy foods. If nausea and/or vomiting occur, drink only clear liquids until the nausea and/or vomiting subsides. Call your physician if vomiting continues.  Special Instructions/Symptoms: Your throat may feel dry or sore from the anesthesia or the breathing tube placed in your throat during surgery. If this causes discomfort, gargle with warm salt water. The discomfort should disappear within 24 hours.   Regional Anesthesia Blocks  1. Numbness or the inability to move the "blocked" extremity may last from 3-48 hours after placement. The length of time depends on the medication injected and your individual response to the medication. If the numbness is not going away after 48 hours, call your surgeon.  2. The extremity that is blocked will need to be protected until the numbness is gone and the  Strength has returned. Because you cannot feel it, you will need to take extra care to avoid injury. Because it may be weak, you may have difficulty moving it or using it. You may not know what position it is in without looking at it while the block is in effect.  3. For blocks in the legs and feet, returning to weight bearing and walking needs to be done carefully. You will need to wait until the numbness is entirely gone and the strength has returned. You should be able to move your leg and foot normally before you try and bear weight or walk. You will need someone to be with you when you first try to ensure  you do not fall and possibly risk injury.  4. Bruising and tenderness at the needle site are common side effects and will resolve in a few days.  5. Persistent numbness or new problems with movement should be communicated to the surgeon.  

## 2020-12-07 NOTE — Brief Op Note (Signed)
12/07/2020  1:00 PM  PATIENT:  John [name]  65 y.o. male  PRE-OPERATIVE DIAGNOSIS:  HALLUX VALGUS AND HAMMERTOE  POST-OPERATIVE DIAGNOSIS:  HALLUX VALGUS AND HAMMERTOE  PROCEDURE:  Procedure(s): METATARSAL OSTEOTOMY TOES 2-4 RIGHT FOOT (Right) HAMMER TOE CORRECTION  2-4 (Right) ARTHRODESIS METATARSALPHALANGEAL JOINT (MTPJ) (Right)  SURGEON:  Surgeon(s) and Role:    Edrick Kins, DPM - Primary  PHYSICIAN ASSISTANT:   ASSISTANTS: none   ANESTHESIA:   regional and general  EBL:  minimal   BLOOD ADMINISTERED:none  DRAINS: none   LOCAL MEDICATIONS USED:  NONE  SPECIMEN:  No Specimen  DISPOSITION OF SPECIMEN:  N/A  COUNTS:  YES  TOURNIQUET:   Total Tourniquet Time Documented: Calf (Right) - 74 minutes Total: Calf (Right) - 74 minutes   DICTATION: .Viviann Spare Dictation  PLAN OF CARE: Discharge to home after PACU  PATIENT DISPOSITION:  PACU - hemodynamically stable.   Delay start of Pharmacological VTE agent (>24hrs) due to surgical blood loss or risk of bleeding: not applicable

## 2020-12-07 NOTE — Progress Notes (Signed)
Assisted Dr. Smith Robert with right, ultrasound guided, popliteal, adductor canal block. Side rails up, monitors on throughout procedure. See vital signs in flow sheet. Tolerated Procedure well.

## 2020-12-07 NOTE — Anesthesia Procedure Notes (Addendum)
  Anesthesia Regional Block: Popliteal block   Pre-Anesthetic Checklist: , timeout performed,  Correct Patient, Correct Site, Correct Laterality,  Correct Procedure, Correct Position, site marked,  Risks and benefits discussed,  Surgical consent,  Pre-op evaluation,  At surgeon's request and post-op pain management  Laterality: Right  Prep: chloraprep       Needles:  Injection technique: Single-shot  Needle Type: Echogenic Stimulator Needle     Needle Length: 9cm  Needle Gauge: 21     Additional Needles:   Procedures:,,,, ultrasound used (permanent image in chart),,    Narrative:  Start time: 12/07/2020 11:05 AM End time: 12/07/2020 11:10 AM Injection made incrementally with aspirations every 5 mL.  Performed by: Personally  Anesthesiologist: Effie Berkshire, MD  Additional Notes: Patient tolerated the procedure well. Local anesthetic introduced in an incremental fashion under minimal resistance after negative aspirations. No paresthesias were elicited. After completion of the procedure, no acute issues were identified and patient continued to be monitored by RN.

## 2020-12-07 NOTE — Anesthesia Postprocedure Evaluation (Signed)
Anesthesia Post Note  Patient: John [name]  Procedure(s) Performed: METATARSAL OSTEOTOMY TOES 2-4 RIGHT FOOT (Right: Toe) HAMMER TOE CORRECTION  2-4 (Right: Toe) ARTHRODESIS METATARSALPHALANGEAL JOINT (MTPJ) (Right: Toe)     Patient location during evaluation: PACU Anesthesia Type: General Level of consciousness: awake and alert Pain management: pain level controlled Vital Signs Assessment: post-procedure vital signs reviewed and stable Respiratory status: spontaneous breathing, nonlabored ventilation, respiratory function stable and patient connected to nasal cannula oxygen Cardiovascular status: blood pressure returned to baseline and stable Postop Assessment: no apparent nausea or vomiting Anesthetic complications: no Comments: Pt endorses pain in the right foot but has a motor block. Will continue to monitor. IV pain medicine being administered and PO pain medicine ordered.    No notable events documented.  Last Vitals:  Vitals:   12/07/20 1315 12/07/20 1330  BP: 125/79 117/72  Pulse: 79 74  Resp: 14 18  Temp:    SpO2: 96% 96%                Effie Berkshire

## 2020-12-08 ENCOUNTER — Encounter (HOSPITAL_BASED_OUTPATIENT_CLINIC_OR_DEPARTMENT_OTHER): Payer: Self-pay | Admitting: Podiatry

## 2020-12-12 NOTE — Op Note (Signed)
OPERATIVE REPORT Patient name: John [name] MRN: [id number] DOB: 03/16/1956  DOS: 12/07/2020  Preop Dx: Hallux valgus left.  Hammertoe 2, 3, 4 right Postop Dx: same  Procedure:  1.  First MTPJ Arthrodesis right 2, 3, 4.  PIPJ arthroplasty 2, 3, 4 right 5, 6, 7.  Metatarsal head resection 2, 3, 4 right  Surgeon: Edrick Kins DPM  Anesthesia: Regional anesthesia block  Hemostasis: Ankle tourniquet inflated to a pressure of 235mHg after esmarch exsanguination   EBL: 20 mL Materials: [employer] Dynaforce Staple compression plate w/ respective screws. Injectables: None Pathology: None  Condition: The patient tolerated the procedure and anesthesia well. No complications noted or reported   Justification for procedure: The patient is a 65y.o. male who presents today for surgical correction of symptomatic hallux valgus with hammertoe deformities of the right foot. All conservative modalities of been unsuccessful in providing any sort of satisfactory alleviation of symptoms with the patient. The patient was told benefits as well as possible side effects of the surgery. The patient consented for surgical correction. The patient consent form was reviewed. All patient questions were answered. No guarantees were expressed or implied. The patient and the surgeon boson the patient consent form with the witness present and placed in the patient's chart.   Procedure in Detail: The patient was brought to the operating room, placed in the operating table in the supine position at which time an aseptic scrub and drape were performed about the patient's respective lower extremity after anesthesia was induced as described above. Attention was then directed to the surgical area where procedure number one commenced.  Procedure #1: First MTPJ arthrodesis right A 5 cm linear longitudinal skin incision was planned and made overlying the dorsal medial aspect of the first MTPJ right foot.  Incision  was carried down to the level of joint capsule with care taken to cut clamp ligate and retract away all small neurovascular structures traversing the incision site.  A linear capsulotomy was performed to expose the underlying joint capsule.  The plantar and lateral aspects of the capsule were released using a manual emery elevator inserted into the joint.  This allowed appropriate exposure for preparation of the joint.  A cup and cone reamer was utilized to resect away the cartilaginous surfaces of the head of the first metatarsal and base of the proximal phalanx.  After appropriate resection of the cartilaginous surfaces of the bone a 0.062 inch K wire was utilized for subchondral drilling of the arthrodesis site in order to allow better arthrodesis.  Temporary fixation of the first ray was achieved using a 0.2062 K wire and permanent internal fixation was utilized with a [employer] Dynaforce first MTPJ compression plate with respective screws.  This was all verified by intraoperative x-ray fluoroscopy which was satisfactory.  Light irrigation was then utilized in preparation for primary closure.  3-0 Vicryl suture was utilized to reapproximate subcutaneous tissue followed by 4-0 Vicryl suture to reapproximate subcuticular tissue followed by stainless steel skin staples to reapproximate superficial skin edges.  Procedure #2: PIPJ arthroplasty second digit right foot Attention was then directed to the second toe of the right foot where an elliptical skin incision was planned and made overlying the PIPJ of the digit.  The ellipse skin wedge was removed and a transverse tenotomy of the EDL tendon was performed to expose the underlying PIPJ.  The hypertrophic head of the proximal phalanx was identified and soft tissue and capsular tissue was sharply reflected away.  An osteotomy was then created using a sagittal saw at the surgical phalangeal neck of the proximal phalanx.  The hypertrophic head of the proximal  phalanx was removed in toto.  Verified by intraoperative x-ray fluoroscopy.  Procedure #3, 4: PIPJ arthroplasty third and fourth digit right foot Procedures number three and four were performed in the exact same fashion and manner as procedure number two, with the exception that procedures number three and four were performed on the third and fourth digit of the right foot  Procedure #5: Second metatarsal head resection right foot A separate 3 cm linear longitudinal skin incision was planned and made overlying the second MTPJ of the right foot.  Incision was carried down to the level of EDL tendon with care taken to cut clamp ligate or retract away all small neurovascular structures traversing the incision site.  Transverse tenotomy was performed to expose the underlying MTPJ.  The head of the second metatarsal was identified and a sagittal blade mounted on the sagittal saw was utilized to create an osteotomy at the neck of the second metatarsal.  The distal metatarsal head was then grasped with a perforating towel clamp and all soft tissue structures around the metatarsal head were reflected away using a #15 scalpel and the head was removed in toto.  To allow for rectus healing postoperatively, a 0.045 inch percutaneous K wire was driven through the entire second ray of the foot.   beginning at the distal tuft of the toe and crossing the DIPJ, PIPJ arthroplasty site, and MTPJ arthroplasty site it was driven retrograde and verified by intraoperative x-ray fluoroscopy.  The percutaneous portion extending onto the distal tip of the toe was bent dorsal at a 90 degree angle and cut with a synthetic ball Placed over the percutaneous portion.  Procedure #6, 7: Third and fourth metatarsal head resections right foot Procedures number six and seven were performed in the exact same fashion and manner as procedure number five, with the exception that procedures number six and seven were performed on the third and  fourth metatarsals of the right foot  Copious irrigation was then utilized to all previously mentioned incision sites about the patient's left foot in preparation for primary closure.  3-0 Vicryl suture was utilized to reapproximate deep subcutaneous tissue followed by 4-0 Vicryl suture to reapproximate superficial tissue followed by stainless steel skin staples to reapproximate superficial skin edges.  4-0 Prolene suture was utilized for primary closure of the transverse incision sites overlying the PIPJ's.  Dry sterile compressive dressings were then applied to all previously mentioned incision sites about the patient's lower extremity. The tourniquet which was used for hemostasis was deflated. All normal neurovascular responses including pink color and warmth returned all the digits of patient's lower extremity.  The patient was then transferred from the operating room to the recovery room having tolerated the procedure and anesthesia well. All vital signs are stable. After a brief stay in the recovery room the patient was discharged with adequate prescriptions for analgesia. Verbal as well as written instructions were provided for the patient regarding wound care. The patient is to keep the dressings clean dry and intact until they are to follow surgeon Dr. Daylene Katayama in the office upon discharge.   Edrick Kins, DPM [facility]  Dr. Edrick Kins, DPM    [address]  [place], [place] [address]                Office [phone]  Fax [phone]

## 2020-12-13 ENCOUNTER — Other Ambulatory Visit: Payer: Self-pay

## 2020-12-13 ENCOUNTER — Ambulatory Visit (INDEPENDENT_AMBULATORY_CARE_PROVIDER_SITE_OTHER): Payer: Medicaid Other | Admitting: Podiatry

## 2020-12-13 ENCOUNTER — Ambulatory Visit (INDEPENDENT_AMBULATORY_CARE_PROVIDER_SITE_OTHER): Payer: Medicaid Other

## 2020-12-13 DIAGNOSIS — M2011 Hallux valgus (acquired), right foot: Secondary | ICD-10-CM

## 2020-12-13 DIAGNOSIS — Z9889 Other specified postprocedural states: Secondary | ICD-10-CM

## 2020-12-13 MED ORDER — OXYCODONE-ACETAMINOPHEN 5-325 MG PO TABS: 1.0000 | ORAL_TABLET | Freq: Four times a day (QID) | ORAL | 0 refills | Status: DC | PRN

## 2020-12-14 ENCOUNTER — Telehealth: Payer: Self-pay | Admitting: Podiatry

## 2020-12-14 NOTE — Telephone Encounter (Signed)
Patients provider [facility] called and stated that [name] received some Percocet 5-325 on 08/11. It was picked up from his pharmacy and she wanted to know how many tablets had been prescribed and how often he was suppose to take it.   Please advise. [place]

## 2020-12-20 ENCOUNTER — Ambulatory Visit (INDEPENDENT_AMBULATORY_CARE_PROVIDER_SITE_OTHER): Payer: Medicaid Other | Admitting: Podiatry

## 2020-12-20 ENCOUNTER — Other Ambulatory Visit: Payer: Self-pay

## 2020-12-20 DIAGNOSIS — Z9889 Other specified postprocedural states: Secondary | ICD-10-CM

## 2020-12-20 MED ORDER — DOXYCYCLINE HYCLATE 100 MG PO TABS: 100.0000 mg | ORAL_TABLET | Freq: Two times a day (BID) | ORAL | 0 refills | Status: DC

## 2020-12-20 MED ORDER — OXYCODONE-ACETAMINOPHEN 5-325 MG PO TABS: 1.0000 | ORAL_TABLET | Freq: Four times a day (QID) | ORAL | 0 refills | Status: DC | PRN

## 2020-12-20 NOTE — Progress Notes (Signed)
   Subjective:  Patient presents today status post right forefoot reconstructive surgery. DOS: 12/07/2020.  Patient states that he is doing better.  Each week he improves.  He has been minimally weightbearing in the cam boot.  No new complaints at this time  Past Medical History:  Diagnosis Date   Allergy    Arthritis    oa back   Ascites    resolved since 2018   Asthma    Cataract    both eyes   CHF (congestive heart failure) ([place]) 2001   sees primary for   Chronic back pain    Chronic kidney disease    ckd 3   Cirrhosis ([place]) resolved since 2018   Coronary artery disease    nonobstrucrtibe   Dyspnea    with heavy exertion   ETOH abuse    Gastric varices    GERD (gastroesophageal reflux disease)    Gout    Hepatitis    hepatitic c 2018 24 week tx with ecuplipsa, no hepatitic c detected after tx   History of blood transfusion 8-9- yrs ago   Hypertension    Optic neuropathy, left    PAD (peripheral artery disease) ([place])    slight  primary manages   Polysubstance abuse ([place])    Renal cell carcinoma ([place]) 2016, 2018 and 2022   left ablation   Seizures [facility])    age 19 none since   Sickle cell trait ([place])    Stroke ([place]) 1998   "light no problems with"   Uses walker 12/04/2020   Wears dentures    Wears glasses       Objective/Physical Exam Neurovascular status intact.  Skin incisions appear to be well coapted with sutures and staples intact. No sign of infectious process noted. No dehiscence. No active bleeding noted. Moderate edema noted to the surgical extremity.  There is some very mild drainage noted to the lateral incision site of the right forefoot.   Assessment: 1. s/p right forefoot reconstructive surgery. DOS: 12/07/2020   Plan of Care:  1. Patient was evaluated. X-rays reviewed 2.  Just for prophylaxis we will prescribe doxycycline 100 mg 2 times daily #20 due to the slight drainage to the lateral incision site of the forefoot 3.  Refill prescription for  Percocet 5/325 mg 4.  Return to clinic in 1 week for staple and suture removal   Edrick Kins, DPM [facility]  Dr. Edrick Kins, DPM    [address], [place] [address]                Office [phone]  Fax [phone]

## 2020-12-27 ENCOUNTER — Other Ambulatory Visit: Payer: Self-pay

## 2020-12-27 ENCOUNTER — Ambulatory Visit (INDEPENDENT_AMBULATORY_CARE_PROVIDER_SITE_OTHER): Payer: Medicaid Other | Admitting: Podiatry

## 2020-12-27 DIAGNOSIS — Z9889 Other specified postprocedural states: Secondary | ICD-10-CM

## 2020-12-27 NOTE — Progress Notes (Signed)
   Subjective:  Patient presents today status post right forefoot reconstructive surgery. DOS: 12/07/2020.  Patient states that he is doing better.  Each week he improves.  He has been minimally weightbearing in the cam boot.  No new complaints at this time  Past Medical History:  Diagnosis Date   Allergy    Arthritis    oa back   Ascites    resolved since 2018   Asthma    Cataract    both eyes   CHF (congestive heart failure) ([place]) 2001   sees primary for   Chronic back pain    Chronic kidney disease    ckd 3   Cirrhosis ([place]) resolved since 2018   Coronary artery disease    nonobstrucrtibe   Dyspnea    with heavy exertion   ETOH abuse    Gastric varices    GERD (gastroesophageal reflux disease)    Gout    Hepatitis    hepatitic c 2018 24 week tx with ecuplipsa, no hepatitic c detected after tx   History of blood transfusion 8-9- yrs ago   Hypertension    Optic neuropathy, left    PAD (peripheral artery disease) ([place])    slight  primary manages   Polysubstance abuse ([place])    Renal cell carcinoma ([place]) 2016, 2018 and 2022   left ablation   Seizures [facility])    age 65 none since   Sickle cell trait ([place])    Stroke ([place]) 1998   "light no problems with"   Uses walker 12/04/2020   Wears dentures    Wears glasses       Objective/Physical Exam Neurovascular status intact.  Skin incisions appear to be well coapted with sutures and staples intact. No sign of infectious process noted. No dehiscence. No active bleeding noted.  Significantly improved edema.  No drainage noted.  Assessment: 1. s/p right forefoot reconstructive surgery. DOS: 12/07/2020   Plan of Care:  1. Patient was evaluated. 2.  Sutures and staples removed today.  Dressings applied.  Continue minimal weightbearing in the cam boot. 3.  Return to clinic in 2 weeks for percutaneous pin fixation removal  Edrick Kins, DPM [facility]  Dr. Edrick Kins, DPM    [address], [place] [address]                Office [phone]  Fax [phone]

## 2020-12-28 NOTE — Progress Notes (Signed)
   Subjective:  Patient presents today status post right forefoot reconstructive surgery. DOS: 12/07/2020.  Patient states that he is doing well.  He denies fever chills nausea vomiting shortness of breath or chest pain.  He has been minimally weightbearing on the foot in the cam boot as instructed.  He states that he has had the dressings clean dry and intact since surgery.  No new complaints at this time  Past Medical History:  Diagnosis Date   Allergy    Arthritis    oa back   Ascites    resolved since 2018   Asthma    Cataract    both eyes   CHF (congestive heart failure) ([place]) 2001   sees primary for   Chronic back pain    Chronic kidney disease    ckd 3   Cirrhosis ([place]) resolved since 2018   Coronary artery disease    nonobstrucrtibe   Dyspnea    with heavy exertion   ETOH abuse    Gastric varices    GERD (gastroesophageal reflux disease)    Gout    Hepatitis    hepatitic c 2018 24 week tx with ecuplipsa, no hepatitic c detected after tx   History of blood transfusion 8-9- yrs ago   Hypertension    Optic neuropathy, left    PAD (peripheral artery disease) ([place])    slight  primary manages   Polysubstance abuse ([place])    Renal cell carcinoma ([place]) 2016, 2018 and 2022   left ablation   Seizures [facility])    age 40 none since   Sickle cell trait ([place])    Stroke ([place]) 1998   "light no problems with"   Uses walker 12/04/2020   Wears dentures    Wears glasses       Objective/Physical Exam Neurovascular status intact.  Skin incisions appear to be well coapted with sutures and staples intact. No sign of infectious process noted. No dehiscence. No active bleeding noted. Moderate edema noted to the surgical extremity.  Radiographic Exam:  Orthopedic hardware and osteotomies sites appear to be stable with routine healing.  Assessment: 1. s/p right forefoot reconstructive surgery. DOS: 12/07/2020   Plan of Care:  1. Patient was evaluated. X-rays reviewed 2.  Dressings  changed today.  Clean dry and intact x1 week 3.  Refill prescription for Percocet 5/325 mg 4.  Continue minimal weightbearing in the cam boot  5.  Return to clinic in 1 week   Edrick Kins, DPM [facility]  Dr. Edrick Kins, DPM    [address], [place] [address]                Office [phone]  Fax [phone]

## 2021-01-03 ENCOUNTER — Ambulatory Visit (INDEPENDENT_AMBULATORY_CARE_PROVIDER_SITE_OTHER): Payer: Medicaid Other | Admitting: Podiatry

## 2021-01-03 ENCOUNTER — Other Ambulatory Visit: Payer: Self-pay

## 2021-01-03 ENCOUNTER — Ambulatory Visit (INDEPENDENT_AMBULATORY_CARE_PROVIDER_SITE_OTHER): Payer: Medicaid Other

## 2021-01-03 DIAGNOSIS — Z9889 Other specified postprocedural states: Secondary | ICD-10-CM | POA: Diagnosis not present

## 2021-01-03 DIAGNOSIS — M2041 Other hammer toe(s) (acquired), right foot: Secondary | ICD-10-CM

## 2021-01-08 ENCOUNTER — Encounter: Payer: Medicaid Other | Admitting: Podiatry

## 2021-01-10 ENCOUNTER — Other Ambulatory Visit: Payer: Self-pay

## 2021-01-10 ENCOUNTER — Ambulatory Visit (INDEPENDENT_AMBULATORY_CARE_PROVIDER_SITE_OTHER): Payer: Medicaid Other | Admitting: Podiatry

## 2021-01-10 DIAGNOSIS — Z9889 Other specified postprocedural states: Secondary | ICD-10-CM

## 2021-01-10 NOTE — Progress Notes (Signed)
   Subjective:  Patient presents today status post right forefoot reconstructive surgery. DOS: 12/07/2020.  Patient states that he is doing better.  Patient states that he is doing very well.  He continues to be weightbearing in the cam boot.  No new complaints at this time Past Medical History:  Diagnosis Date   Allergy    Arthritis    oa back   Ascites    resolved since 2018   Asthma    Cataract    both eyes   CHF (congestive heart failure) ([place]) 2001   sees primary for   Chronic back pain    Chronic kidney disease    ckd 3   Cirrhosis ([place]) resolved since 2018   Coronary artery disease    nonobstrucrtibe   Dyspnea    with heavy exertion   ETOH abuse    Gastric varices    GERD (gastroesophageal reflux disease)    Gout    Hepatitis    hepatitic c 2018 24 week tx with ecuplipsa, no hepatitic c detected after tx   History of blood transfusion 8-9- yrs ago   Hypertension    Optic neuropathy, left    PAD (peripheral artery disease) ([place])    slight  primary manages   Polysubstance abuse ([place])    Renal cell carcinoma ([place]) 2016, 2018 and 2022   left ablation   Seizures [facility])    age 9 none since   Sickle cell trait ([place])    Stroke ([place]) 1998   "light no problems with"   Uses walker 12/04/2020   Wears dentures    Wears glasses       Objective/Physical Exam Neurovascular status intact.  Skin incisions appear to be well coapted and healed with exception of some small area of dehiscence about 1.5 cm in length along the dorsal lateral incision site of the foot.  There is some fibrotic debris in the wound base.  No malodor.  No drainage no sign of infectious process noted. No dehiscence. No active bleeding noted.  Negative for any significant edema.  Assessment: 1. s/p right forefoot reconstructive surgery. DOS: 12/07/2020 2.  Retained stainless steel skin staples right foot x2   Plan of Care:  1. Patient was evaluated.  The retained skin staples were removed today that  were identified on x-rays taken last visit 2.  Light debridement of the right foot wound was performed using a tissue nipper.  Excisional debridement of the necrotic tissue was performed.  Recommend Betadine ointment and a light dressing daily 3.  Patient may begin to transition out of the cam boot into good supportive sneakers 4.  Return to clinic in 3 weeks  Edrick Kins, DPM [facility]  Dr. Edrick Kins, DPM    [address], [place] [address]                Office [phone]  Fax [phone]

## 2021-01-15 NOTE — Progress Notes (Signed)
   Subjective:  Patient presents today status post right forefoot reconstructive surgery. DOS: 12/07/2020.  Patient states that he is doing better.  Each week he improves.  Patient continues to be minimally weightbearing in the cam boot.  He was concerned today because he says he has been walking a lot more and he says the second toe pen is coming out.  He presents for further treatment and evaluation  Past Medical History:  Diagnosis Date   Allergy    Arthritis    oa back   Ascites    resolved since 2018   Asthma    Cataract    both eyes   CHF (congestive heart failure) ([place]) 2001   sees primary for   Chronic back pain    Chronic kidney disease    ckd 3   Cirrhosis ([place]) resolved since 2018   Coronary artery disease    nonobstrucrtibe   Dyspnea    with heavy exertion   ETOH abuse    Gastric varices    GERD (gastroesophageal reflux disease)    Gout    Hepatitis    hepatitic c 2018 24 week tx with ecuplipsa, no hepatitic c detected after tx   History of blood transfusion 8-9- yrs ago   Hypertension    Optic neuropathy, left    PAD (peripheral artery disease) ([place])    slight  primary manages   Polysubstance abuse ([place])    Renal cell carcinoma ([place]) 2016, 2018 and 2022   left ablation   Seizures [facility])    age 23 none since   Sickle cell trait ()    Stroke ([place]) 1998   "light no problems with"   Uses walker 12/04/2020   Wears dentures    Wears glasses       Objective/Physical Exam Neurovascular status intact.  Skin incisions appear to be well coapted with sutures and staples intact. No sign of infectious process noted. No dehiscence. No active bleeding noted.  Significantly improved edema.  No drainage noted.  Assessment: 1. s/p right forefoot reconstructive surgery. DOS: 12/07/2020   Plan of Care:  1. Patient was evaluated. 2.  Percutaneous fixation pins were removed today.   3.  The patient may transition out of the cam boot into good supportive shoes and  sneakers  4.  Return to clinic in 3 weeks for follow-up x-ray  Edrick Kins, DPM [facility]  Dr. Edrick Kins, DPM    [address],  [address]                Office [phone]  Fax [phone]

## 2021-01-29 ENCOUNTER — Ambulatory Visit (INDEPENDENT_AMBULATORY_CARE_PROVIDER_SITE_OTHER): Payer: Medicaid Other | Admitting: Podiatry

## 2021-01-29 ENCOUNTER — Other Ambulatory Visit: Payer: Self-pay

## 2021-01-29 ENCOUNTER — Encounter: Payer: Medicaid Other | Admitting: Podiatry

## 2021-01-29 DIAGNOSIS — Z9889 Other specified postprocedural states: Secondary | ICD-10-CM

## 2021-02-05 ENCOUNTER — Encounter: Payer: Medicaid Other | Admitting: Podiatry

## 2021-02-05 NOTE — Progress Notes (Signed)
   Subjective:  Patient presents today status post right forefoot reconstructive surgery. DOS: 12/07/2020.  Overall the patient states that he is doing much better.  There continues to be improvement.  He continues to have some slight tenderness however he is very satisfied so far with the surgery and doing well with the recovery  Past Medical History:  Diagnosis Date   Allergy    Arthritis    oa back   Ascites    resolved since 2018   Asthma    Cataract    both eyes   CHF (congestive heart failure) ([place]) 2001   sees primary for   Chronic back pain    Chronic kidney disease    ckd 3   Cirrhosis ([place]) resolved since 2018   Coronary artery disease    nonobstrucrtibe   Dyspnea    with heavy exertion   ETOH abuse    Gastric varices    GERD (gastroesophageal reflux disease)    Gout    Hepatitis    hepatitic c 2018 24 week tx with ecuplipsa, no hepatitic c detected after tx   History of blood transfusion 8-9- yrs ago   Hypertension    Optic neuropathy, left    PAD (peripheral artery disease) ([place])    slight  primary manages   Polysubstance abuse ([place])    Renal cell carcinoma ([place]) 2016, 2018 and 2022   left ablation   Seizures [facility])    age 76 none since   Sickle cell trait ([place])    Stroke ([place]) 1998   "light no problems with"   Uses walker 12/04/2020   Wears dentures    Wears glasses       Objective/Physical Exam Neurovascular status intact.  Skin incisions appear to be well coapted and healed with exception of some small area of dehiscence about 1.0 cm in length along the dorsal lateral incision site of the foot.  There is some minimal fibrotic debris in the wound base.  No malodor.  No drainage no sign of infectious process noted. No dehiscence. No active bleeding noted.  Negative for any significant edema.  Assessment: 1. s/p right forefoot reconstructive surgery. DOS: 12/07/2020 2.  Retained stainless steel skin staples right foot x2   Plan of Care:  1. Patient  was evaluated.   2.  Overall the patient is doing very well.  He may discontinue the cam boot today recommend good supportive sneakers and shoes 3.  Recommend antibiotic ointment and a Band-Aid to the small area of dehiscence over the dorsum of the foot 4.  Return to clinic in 4 weeks  Edrick Kins, DPM [facility]  Dr. Edrick Kins, DPM    [address],  [address]                Office [phone]  Fax [phone]

## 2021-02-19 ENCOUNTER — Ambulatory Visit (INDEPENDENT_AMBULATORY_CARE_PROVIDER_SITE_OTHER): Payer: Medicaid Other | Admitting: Podiatry

## 2021-02-19 ENCOUNTER — Other Ambulatory Visit: Payer: Self-pay

## 2021-02-19 DIAGNOSIS — Z9889 Other specified postprocedural states: Secondary | ICD-10-CM

## 2021-02-19 NOTE — Progress Notes (Signed)
   Subjective:  Patient presents today status post right forefoot reconstructive surgery. DOS: 12/07/2020.  Overall the patient continues to do well.  He is walking much better.  No new complaints at this time  Past Medical History:  Diagnosis Date   Allergy    Arthritis    oa back   Ascites    resolved since 2018   Asthma    Cataract    both eyes   CHF (congestive heart failure) ([place]) 2001   sees primary for   Chronic back pain    Chronic kidney disease    ckd 3   Cirrhosis ([place]) resolved since 2018   Coronary artery disease    nonobstrucrtibe   Dyspnea    with heavy exertion   ETOH abuse    Gastric varices    GERD (gastroesophageal reflux disease)    Gout    Hepatitis    hepatitic c 2018 24 week tx with ecuplipsa, no hepatitic c detected after tx   History of blood transfusion 8-9- yrs ago   Hypertension    Optic neuropathy, left    PAD (peripheral artery disease) ([place])    slight  primary manages   Polysubstance abuse ([place])    Renal cell carcinoma ([place]) 2016, 2018 and 2022   left ablation   Seizures [facility])    age 66 none since   Sickle cell trait ([place])    Stroke ([place]) 1998   "light no problems with"   Uses walker 12/04/2020   Wears dentures    Wears glasses       Objective/Physical Exam Neurovascular status intact.  Skin incisions appear to be well coapted and healed with exception of a very small superficial area over the dorsal lateral incision site measuring approximately 0.3 x 0.3 x 0.1.  Significantly improved since last visit.  No malodor.  No drainage no sign of infectious process noted. No dehiscence. No active bleeding noted.  Negative for any significant edema.  Assessment: 1. s/p right forefoot reconstructive surgery. DOS: 12/07/2020   Plan of Care:  1. Patient was evaluated.   2.  Continue wearing good supportive shoes and sneakers 3.  Patient may resume full activity no restrictions 4.  Continue antibiotic ointment and a light Band-Aid until the  wound is completely healed  5.  Return to clinic as needed  Edrick Kins, DPM [facility]  Dr. Edrick Kins, DPM    [address], [place] [address]                Office [phone]  Fax [phone]

## 2021-03-14 ENCOUNTER — Telehealth: Payer: Self-pay | Admitting: Podiatry

## 2021-03-14 NOTE — Telephone Encounter (Signed)
Patient called the office requesting pain mediation.    Please advise .Marland KitchenMarland Kitchen

## 2021-03-16 ENCOUNTER — Telehealth: Payer: Self-pay | Admitting: *Deleted

## 2021-03-16 NOTE — Telephone Encounter (Signed)
Patient is having a lot of foot pain w/ walking, has not been seen since 02/19/21, is requesting a refill of pain medicine(oxycodone -ace,5-325 mg),f/u appointment for reevaluation on foot pain.please advise.

## 2021-03-19 NOTE — Telephone Encounter (Signed)
I called [name] to let him know that he needs to be reevaluated prior to any refill of pain medication. We have the wrong number on file .

## 2021-03-19 NOTE — Telephone Encounter (Signed)
Patient needs to be reevaluated prior to any refill of pain medication.  He can come in for an appointment if needed.-Dr. Amalia Hailey

## 2021-03-19 NOTE — Telephone Encounter (Signed)
Return the call to patient, no answer,left vmessage giving physician's recommendations  and no refill on pain medications.

## 2021-03-19 NOTE — Telephone Encounter (Signed)
No refill pain medications.  Recommend reducing activity and resting and icing the foot as needed.  He can come in for a follow-up appointment if he requests.  Thanks, Dr. Amalia Hailey

## 2021-04-18 ENCOUNTER — Encounter: Payer: Self-pay | Admitting: Gastroenterology

## 2021-05-08 ENCOUNTER — Ambulatory Visit (INDEPENDENT_AMBULATORY_CARE_PROVIDER_SITE_OTHER): Payer: Medicaid Other | Admitting: Gastroenterology

## 2021-05-08 ENCOUNTER — Other Ambulatory Visit (INDEPENDENT_AMBULATORY_CARE_PROVIDER_SITE_OTHER): Payer: Medicaid Other

## 2021-05-08 ENCOUNTER — Encounter: Payer: Self-pay | Admitting: Gastroenterology

## 2021-05-08 VITALS — BP 112/70 | HR 70 | Ht 68.0 in | Wt 238.0 lb

## 2021-05-08 DIAGNOSIS — K7460 Unspecified cirrhosis of liver: Secondary | ICD-10-CM

## 2021-05-08 DIAGNOSIS — K2100 Gastro-esophageal reflux disease with esophagitis, without bleeding: Secondary | ICD-10-CM

## 2021-05-08 DIAGNOSIS — Z86010 Personal history of colonic polyps: Secondary | ICD-10-CM | POA: Diagnosis not present

## 2021-05-08 LAB — COMPREHENSIVE METABOLIC PANEL
ALT: 9 U/L (ref 0–53)
AST: 13 U/L (ref 0–37)
Albumin: 4.3 g/dL (ref 3.5–5.2)
Alkaline Phosphatase: 95 U/L (ref 39–117)
BUN: 12 mg/dL (ref 6–23)
CO2: 27 mEq/L (ref 19–32)
Calcium: 9.7 mg/dL (ref 8.4–10.5)
Chloride: 103 mEq/L (ref 96–112)
Creatinine, Ser: 1.66 mg/dL — ABNORMAL HIGH (ref 0.40–1.50)
GFR: 43.32 mL/min — ABNORMAL LOW (ref >60.00)
Glucose, Bld: 93 mg/dL (ref 70–99)
Potassium: 4.2 mEq/L (ref 3.5–5.1)
Sodium: 139 mEq/L (ref 135–145)
Total Bilirubin: 0.8 mg/dL (ref 0.2–1.2)
Total Protein: 8.1 g/dL (ref 6.0–8.3)

## 2021-05-08 LAB — CBC WITH DIFFERENTIAL/PLATELET
Basophils Absolute: 0.1 10*3/uL (ref 0.0–0.1)
Basophils Relative: 0.6 % (ref 0.0–3.0)
Eosinophils Absolute: 0.1 10*3/uL (ref 0.0–0.7)
Eosinophils Relative: 0.9 % (ref 0.0–5.0)
HCT: 42.8 % (ref 39.0–52.0)
Hemoglobin: 13.9 g/dL (ref 13.0–17.0)
Lymphocytes Relative: 20.6 % (ref 12.0–46.0)
Lymphs Abs: 1.8 10*3/uL (ref 0.7–4.0)
MCHC: 32.5 g/dL (ref 30.0–36.0)
MCV: 80.4 fl (ref 78.0–100.0)
Monocytes Absolute: 0.9 10*3/uL (ref 0.1–1.0)
Monocytes Relative: 10.3 % (ref 3.0–12.0)
Neutro Abs: 5.8 10*3/uL (ref 1.4–7.7)
Neutrophils Relative %: 67.6 % (ref 43.0–77.0)
Platelets: 187 10*3/uL (ref 150.0–400.0)
RBC: 5.32 Mil/uL (ref 4.22–5.81)
RDW: 17.2 % — ABNORMAL HIGH (ref 11.5–15.5)
WBC: 8.5 10*3/uL (ref 4.0–10.5)

## 2021-05-08 LAB — PROTIME-INR
INR: 1.1 ratio — ABNORMAL HIGH (ref 0.8–1.0)
Prothrombin Time: 11.9 s (ref 9.6–13.1)

## 2021-05-08 LAB — AMMONIA: Ammonia: 60 umol/L — ABNORMAL HIGH (ref 11–35)

## 2021-05-08 NOTE — Progress Notes (Signed)
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  He has a history of portal gastropathy, esophageal varices, bleeding gastric varices treated with BRTO and HE.  He was felt not to be a TIPS candidate at the time of BRTO.  Hepatitis C was previously treated with Epclusa in 2019 at [facility].  No alcohol use since 2016.  He states he feels well and is without complaints.  His weight has increased 7 pounds since his office visit in July.  MRI abdomen 11/17/2020 IMPRESSION: 1. Interval repeat left renal cryoablation with no residual enhancement at the site of suspected recurrence. Currently no significant abnormal enhancing renal lesion. 2. 1.3 cm Bosniak category 2 cyst of the left kidney upper pole. 3. Hepatic cirrhosis with uphill paraesophageal varices indicating portal venous hypertension. 4. Mild cardiomegaly.    Current Medications, Allergies, Past Medical History, Past Surgical History, Family History and Social History were reviewed in [employer] record.   Physical Exam: General: Well developed, well nourished, no acute distress Head: Normocephalic and atraumatic Eyes: Sclerae anicteric, EOMI Ears: Normal auditory acuity Mouth: Not examined, mask on during Covid-19 pandemic Lungs: Clear throughout to auscultation Heart: Regular rate and rhythm; no murmurs, rubs or bruits Abdomen: Soft, non tender and non distended. No masses, hepatosplenomegaly or hernias noted. Normal Bowel sounds Rectal: Not done Musculoskeletal: Symmetrical with no gross deformities  Pulses:  Normal pulses noted Extremities: No clubbing, cyanosis, edema or deformities noted Neurological: Alert oriented x 4, grossly nonfocal Psychological:  Alert and cooperative. 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  Ascites has not been noted on his last several imaging studies.  Continue furosemide 40 mg daily and spironolactone 25 mg daily.  Continue lactulose 20 g qod.  His last EGD in March 2022.  Repeat EGD in March 2024.  CMP, CBC, PT/INR, AFP, ammonia today.  Schedule RUQ [place]. GERD with LA Grade A esophagitis and an esophageal stricture.  Continue omeprazole 40 mg p.o. daily, not as needed.  Follow antireflux measures. Personal history of tubular adenoma and a serrated adenoma in November 2019.  We are deferring surveillance colonoscopy at this time due to #1. History of left renal cell carcinoma treated with cryoablation and followed by IR.

## 2021-05-08 NOTE — Patient Instructions (Signed)
Your provider has requested that you go to the basement level for lab work before leaving today. Press "B" on the elevator. The lab is located at the first door on the left as you exit the elevator.  You have been scheduled for an abdominal ultrasound at [facility] (1st floor of hospital) on 05/18/21 at 9:00am. Please arrive 30 minutes prior to your appointment for registration. Make certain not to have anything to eat or drink 6 hours prior to your appointment. Should you need to reschedule your appointment, please contact radiology at [phone]. This test typically takes about 30 minutes to perform.  The [facility] providers would like to encourage you to use [facility] to communicate with providers for non-urgent requests or questions.  Due to long hold times on the telephone, sending your provider a message by [facility] may be a faster and more efficient way to get a response.  Please allow 48 business hours for a response.  Please remember that this is for non-urgent requests.   Due to recent changes in healthcare laws, you may see the results of your imaging and laboratory studies on MyChart before your provider has had a chance to review them.  We understand that in some cases there may be results that are confusing or concerning to you. Not all laboratory results come back in the same time frame and the provider may be waiting for multiple results in order to interpret others.  Please give [place] 48 hours in order for your provider to thoroughly review all the results before contacting the office for clarification of your results.   Thank you for choosing me and [facility].  Pricilla Riffle. Dagoberto Ligas., MD., Marval Regal

## 2021-05-09 ENCOUNTER — Other Ambulatory Visit: Payer: Self-pay | Admitting: Interventional Radiology

## 2021-05-09 DIAGNOSIS — N2889 Other specified disorders of kidney and ureter: Secondary | ICD-10-CM

## 2021-05-09 LAB — AFP TUMOR MARKER: AFP-Tumor Marker: 7.1 ng/mL — ABNORMAL HIGH (ref <6.1)

## 2021-05-18 ENCOUNTER — Ambulatory Visit (HOSPITAL_COMMUNITY): Admission: RE | Admit: 2021-05-18 | Payer: Medicaid Other | Source: Ambulatory Visit

## 2021-05-25 ENCOUNTER — Ambulatory Visit (HOSPITAL_COMMUNITY)
Admission: RE | Admit: 2021-05-25 | Discharge: 2021-05-25 | Disposition: A | Discharge status: Home / Self Care | Payer: Medicaid Other | Source: Ambulatory Visit | Attending: Interventional Radiology | Admitting: Interventional Radiology

## 2021-05-25 ENCOUNTER — Other Ambulatory Visit: Payer: Self-pay

## 2021-05-25 DIAGNOSIS — N2889 Other specified disorders of kidney and ureter: Secondary | ICD-10-CM | POA: Insufficient documentation

## 2021-05-25 MED ORDER — GADOBUTROL 1 MMOL/ML IV SOLN
10.0000 mL | Freq: Once | INTRAVENOUS | Status: AC | PRN
  Administered 2021-05-25: 10 mL via INTRAVENOUS

## 2021-05-28 ENCOUNTER — Telehealth: Payer: Medicaid Other

## 2021-05-29 ENCOUNTER — Telehealth: Payer: Self-pay | Admitting: Acute Care

## 2021-05-29 NOTE — Telephone Encounter (Signed)
Contacted patient after receiving a call from [place] at [place].  She would like [place] to contact patient to see if he qualifies for LCS.  Patient did not meeting the criteria for pack years, as he has never smoked more than 5-6 cigarettes per day and did not smoke every day, only socially.Patient states I have permission to share this information with Leda Gauze so their provider at [facility] would know he could have CT w/o contrast ordered through them, even though he does not qualify for LCS.  Call placed to [facility] at [facility] with update.

## 2021-05-31 ENCOUNTER — Other Ambulatory Visit: Payer: Self-pay

## 2021-05-31 ENCOUNTER — Encounter: Payer: Self-pay | Admitting: *Deleted

## 2021-05-31 ENCOUNTER — Ambulatory Visit
Admission: RE | Admit: 2021-05-31 | Discharge: 2021-05-31 | Disposition: A | Discharge status: Home / Self Care | Payer: Medicaid Other | Source: Ambulatory Visit | Attending: Interventional Radiology | Admitting: Interventional Radiology

## 2021-05-31 DIAGNOSIS — N2889 Other specified disorders of kidney and ureter: Secondary | ICD-10-CM

## 2021-05-31 HISTORY — PX: IR RADIOLOGIST EVAL & MGMT: IMG5224

## 2021-05-31 NOTE — Progress Notes (Signed)
Patient ID: John [name], male   DOB: 1956-01-24, 66 y.o.   MRN: [id number]       Chief Complaint:  Status post left renal cryoablation, outpatient surveillance  Referring Physician(s): [facility]  History of Present Illness: John [name] is a 66 y.o. male with a known history of mild chronic renal insufficiency, hepatitis C, cirrhosis and portal hypertension.  He is well-known to our service because he has had a previous BRTO for bleeding gastric varices in 2016.  He has also undergone 2 left renal cryoablation's originally performed in 2018 and a second ablation for local recurrence performed July 05, 2020.  He has been undergoing surveillance MRI every 6 months.  Overall he continues to do very well.  No recent illness, fever, urinary tract symptoms, hematuria, dysuria, abdominal pain or flank pain.  He remains at his baseline.  Surveillance MRI performed 05/25/2021 at [facility] demonstrates stable ablation defect.  No suspicious nodule or enhancement at the treated site.  Overall expected findings post treatment.  No new abnormality.  Past Medical History:  Diagnosis Date   Allergy    Arthritis    oa back   Ascites    resolved since 2018   Asthma    Cataract    both eyes   CHF (congestive heart failure) ([place]) 2001   sees primary for   Chronic back pain    Chronic kidney disease    ckd 3   Cirrhosis ([place]) resolved since 2018   Coronary artery disease    nonobstrucrtibe   Dyspnea    with heavy exertion   ETOH abuse    Gastric varices    GERD (gastroesophageal reflux disease)    Gout    Hepatitis    hepatitic c 2018 24 week tx with ecuplipsa, no hepatitic c detected after tx   History of blood transfusion 8-9- yrs ago   Hypertension    Optic neuropathy, left    PAD (peripheral artery disease) ([place])    slight  primary manages   Polysubstance abuse ([place])    Renal cell carcinoma ([place]) 2016, 2018 and 2022   left ablation   Seizures [facility])     age 35 none since   Sickle cell trait ([place])    Stroke ([place]) 1998   "light no problems with"   Uses walker 12/04/2020   Wears dentures    Wears glasses     Past Surgical History:  Procedure Laterality Date   ARTHRODESIS METATARSALPHALANGEAL JOINT (MTPJ) Right 12/07/2020   Procedure: ARTHRODESIS METATARSALPHALANGEAL JOINT (MTPJ);  Surgeon: Edrick Kins, DPM;  Location: [place];  Service: Podiatry;  Laterality: Right;   COLONOSCOPY  2021   ESOPHAGOGASTRODUODENOSCOPY N/A 08/14/2014   Procedure: ESOPHAGOGASTRODUODENOSCOPY (EGD);  Surgeon: Ladene Artist, MD;  Location: [facility];  Service: Endoscopy;  Laterality: N/A;   ESOPHAGOGASTRODUODENOSCOPY (EGD) WITH PROPOFOL N/A 06/03/2017   Procedure: ESOPHAGOGASTRODUODENOSCOPY (EGD) WITH PROPOFOL;  Surgeon: Ladene Artist, MD;  Location: [facility];  Service: Endoscopy;  Laterality: N/A;   HALLUX FUSION Left 07/28/2020   Procedure: HALLUX FUSION MPJ;  Surgeon: Edrick Kins, DPM;  Location: [place];  Service: Podiatry;  Laterality: Left;   HAMMER TOE SURGERY Left 07/28/2020   Procedure: HAMMER TOE CORRECTION  2,3,AND4 LEFT FOOT;  Surgeon: Edrick Kins, DPM;  Location: [place];  Service: Podiatry;  Laterality: Left;   HAMMER TOE SURGERY Right 12/07/2020   Procedure: HAMMER TOE CORRECTION  2-4;  Surgeon: Edrick Kins, DPM;  Location: [facility];  Service: Podiatry;  Laterality: Right;   IR RADIOLOGIST EVAL & MGMT  09/25/2016   IR RADIOLOGIST EVAL & MGMT  12/10/2016   IR RADIOLOGIST EVAL & MGMT  03/25/2018   IR RADIOLOGIST EVAL & MGMT  03/24/2019   IR RADIOLOGIST EVAL & MGMT  05/17/2020   IR RADIOLOGIST EVAL & MGMT  08/03/2020   IR RADIOLOGIST EVAL & MGMT  11/22/2020   METATARSAL HEAD EXCISION Left 07/28/2020   Procedure: METATARSAL HEAD EXCISION TOES 2,3,AND 4  LEFT FOOT;  Surgeon: Edrick Kins, DPM;  Location: [place];  Service: Podiatry;   Laterality: Left;   METATARSAL OSTEOTOMY Right 12/07/2020   Procedure: METATARSAL OSTEOTOMY TOES 2-4 RIGHT FOOT;  Surgeon: Edrick Kins, DPM;  Location: [place];  Service: Podiatry;  Laterality: Right;   none     RADIOFREQUENCY ABLATION Left 11/08/2016   Procedure: LEFT RENAL CRYOABLATION;  Surgeon: Greggory Keen, MD;  Location: [facility];  Service: Anesthesiology;  Laterality: Left;   RADIOLOGY WITH ANESTHESIA N/A 08/15/2014   Procedure: RADIOLOGY WITH ANESTHESIA;  Surgeon: Greggory Keen, MD;  Location: [place];  Service: Radiology;  Laterality: N/A;   RADIOLOGY WITH ANESTHESIA Left 07/05/2020   Procedure: CT CRYOABLATION;  Surgeon: Greggory Keen, MD;  Location: [facility];  Service: Anesthesiology;  Laterality: Left;   UPPER GASTROINTESTINAL ENDOSCOPY      Allergies: Penicillins  Medications: Prior to Admission medications   Medication Sig Start Date End Date Taking? Authorizing Provider  allopurinol (ZYLOPRIM) 100 MG tablet Take 1 tablet (100 mg total) by mouth daily. 05/12/18   Eugenie Filler, MD  amLODipine (NORVASC) 10 MG tablet Take 1 tablet (10 mg total) by mouth daily. 05/25/15   Debbe Odea, MD  atorvastatin (LIPITOR) 40 MG tablet Take 40 mg by mouth daily. 04/29/19   [provider]  colchicine 0.6 MG tablet Take 1 tablet (0.6 mg total) by mouth daily. 05/07/18   Eugenie Filler, MD  doxycycline (VIBRA-TABS) 100 MG tablet Take 1 tablet (100 mg total) by mouth 2 (two) times daily. 12/20/20   Edrick Kins, DPM  FLOVENT HFA 220 MCG/ACT inhaler Inhale 2 puffs into the lungs in the morning and at bedtime. 01/27/19   [provider]  folic acid (FOLVITE) 1 MG tablet Take 1 tablet (1 mg total) by mouth daily. 05/07/18   Eugenie Filler, MD  furosemide (LASIX) 40 MG tablet Take 40 mg by mouth daily.    [provider]  hydrALAZINE (APRESOLINE) 50 MG tablet Take 1 tablet (50 mg total) by mouth 3 (three) times daily. Patient taking  differently: Take 50 mg by mouth in the morning and at bedtime. 05/25/15   Debbe Odea, MD  ketoconazole (NIZORAL) 2 % shampoo Apply 1 application topically See admin instructions. Prescribed 06/26/20: Apply topically in shower once every 3 days as needed for itching for 8 weeks. 2 X WEEK    [provider]  lactulose (CHRONULAC) 10 GM/15ML solution Take 20 g by mouth every other day. 11/01/16   [provider]  omeprazole (PRILOSEC) 40 MG capsule TAKE ONE CAPSULE BY MOUTH DAILY Patient taking differently: Take 40 mg by mouth daily as needed (acid reflux). 07/02/18   Ladene Artist, MD  oxyCODONE-acetaminophen (PERCOCET) 5-325 MG tablet Take 1 tablet by mouth every 6 (six) hours as needed for severe pain. 12/20/20   Edrick Kins, DPM  PROAIR HFA 108 (  90 Base) MCG/ACT inhaler Inhale 2 puffs into the lungs every 6 (six) hours as needed for wheezing or shortness of breath. 10/02/17   [provider]  spironolactone (ALDACTONE) 25 MG tablet Take 25 mg by mouth daily as needed (swelling). 06/13/18   [provider]     Family History  Problem Relation Age of Onset   Hypertension Mother        Living   Kidney disease Mother    Diabetes Mother    Heart disease Mother    Hypertension Father        Deceased, 33   Ulcers Father    Stomach cancer Father    Hypertension Brother    Diabetes Brother    Kidney disease Brother    Hypertension Sister    Colon cancer Neg Hx    Esophageal cancer Neg Hx    Rectal cancer Neg Hx     Social History   Socioeconomic History   Marital status: Single    Spouse name: Not on file   Number of children: 0   Years of education: Not on file   Highest education level: Not on file  Occupational History   Occupation: retired  Tobacco Use   Smoking status: Former    Packs/day: 0.30    Years: 20.00    Pack years: 6.00    Types: Cigarettes    Quit date: 04/30/2011    Years since quitting: 10.0   Smokeless tobacco: Never    Tobacco comments:    2015  Vaping Use   Vaping Use: Never used  Substance and Sexual Activity   Alcohol use: No    Alcohol/week: 0.0 standard drinks    Comment: stopped 8-9 yrs ago   Drug use: Yes    Types: Cocaine, Marijuana    Comment: cocaine last used 11-28-2020 and marijuan last used 11-28-2020   Sexual activity: Not Currently  Other Topics Concern   Not on file  Social History Narrative   Lives with niece in a 2 story home.     On disability since 2015 for low back pain.  Used to work Statistician.    Highest level of education: 11th grade   Social Determinants of Health   Financial Resource Strain: Not on file  Food Insecurity: Not on file  Transportation Needs: Not on file  Physical Activity: Not on file  Stress: Not on file  Social Connections: Not on file    Review of Systems  Review of Systems: A 12 point ROS discussed and pertinent positives are indicated in the HPI above.  All other systems are negative.  Physical Exam No direct physical exam was performed, telephone health visit only today Vital Signs: There were no vitals taken for this visit.  Imaging: MR ABDOMEN WWO CONTRAST  Result Date: 05/27/2021 CLINICAL DATA:  66 year old male with history of left renal mass treated with cryoablation. Follow-up study. EXAM: MRI ABDOMEN WITHOUT AND WITH CONTRAST TECHNIQUE: Multiplanar multisequence MR imaging of the abdomen was performed both before and after the administration of intravenous contrast. CONTRAST:  40mL GADAVIST GADOBUTROL 1 MMOL/ML IV SOLN COMPARISON:  Abdominal MRI 10/17/2020. FINDINGS: Lower chest: Unremarkable. Hepatobiliary: Liver has a shrunken appearance and nodular contour, indicative of underlying cirrhosis. In the central aspect of the liver between segments 4A and 4B (axial image 39 of series 16) there is a 3 mm hypervascular lesion which is not well demonstrated on precontrast T1 or T2 weighted images, but appears to demonstrate some subtle  diffusion restriction, and potential central washout on delayed post gadolinium imaging (axial image 39 of series 31 with potential surrounding pseudocapsule, although given the small size of the lesion this is not yet definitive. No other suspicious cystic or solid hepatic lesions. No intra or extrahepatic biliary ductal dilatation. Gallbladder is normal in appearance. Common bile duct measures 5 mm in the porta hepatis. Pancreas: No pancreatic mass. No pancreatic ductal dilatation. No pancreatic or peripancreatic fluid collections or inflammatory changes. Tiny focus of intraparenchymal fat in the uncinate process of the pancreas incidentally noted. Spleen:  Unremarkable. Adrenals/Urinary Tract: Cortical irregularity in the upper pole of the left kidney, corresponding to site of prior cryoablation. Immediately adjacent to this (axial image 51 of series 12) there is a 1.5 x 1.2 cm T1 hyperintense, T2 slightly hypointense lesion which does not demonstrate definitive internal enhancement on post gadolinium imaging, compatible with a mildly proteinaceous (Bosniak class 2) cyst. Additionally, in the lateral aspect of the lower pole of the right kidney (axial image 70 of series 12) there is a small lesion measuring 6 mm which is mildly T1 hyperintense and T2 hyperintense, without definite internal enhancement, compatible with a Bosniak class 2 proteinaceous/hemorrhagic cysts. No other aggressive appearing renal lesions are noted. No hydroureteronephrosis in the visualized portions of the abdomen. Bilateral adrenal glands are normal in appearance. Stomach/Bowel: Visualized portions are unremarkable. Vascular/Lymphatic: No aneurysm identified in the visualized abdominal vasculature. Portal vein is dilated measuring 16 mm. No lymphadenopathy noted in the abdomen. Other: No significant volume of ascites noted in the visualized portions of the peritoneal cavity. Musculoskeletal: No aggressive appearing osseous lesions are  noted in the visualized portions of the skeleton. IMPRESSION: 1. Stable appearance of cryoablation defect in the upper pole of the left kidney. No findings to suggest locally recurrent disease. 2. Small Bosniak class 2 cysts in the kidneys, as above. 3. Morphologic changes in the liver indicative of underlying cirrhosis. Between segments 4A and 4B there is a tiny 3 mm lesion which has potentially aggressive imaging characteristics categorized as Li-RADS 4. Further clinical evaluation along with correlation with AFP levels is recommended. 4. Mild dilatation of the portal vein, suggestive of portal venous hypertension. Electronically Signed   By: Vinnie Langton M.D.   On: 05/27/2021 05:54    Labs:  CBC: Recent Labs    07/05/20 0700 07/06/20 0543 07/28/20 1226 11/02/20 1141 12/07/20 1017 05/08/21 1009  WBC 6.0 9.2  --  8.0  --  8.5  HGB 12.3* 11.1* 12.9* 12.0* 14.3 13.9  HCT 37.3* 33.8* 38.0* 35.9* 42.0 42.8  PLT 108* 91*  --  130.0*  --  187.0    COAGS: Recent Labs    07/05/20 0700 11/02/20 1141 05/08/21 1009  INR 1.1 1.1* 1.1*    BMP: Recent Labs    07/05/20 0700 07/06/20 0543 07/28/20 1226 11/02/20 1141 12/07/20 1017 05/08/21 1009  NA 133* 136 138 136 139 139  K 3.6 4.6 3.7 4.8 4.1 4.2  CL 99 103 100 101 103 103  CO2 22 20*  --  28  --  27  GLUCOSE 86 102* 87 92 93 93  BUN 14 20 23 18 18 12   CALCIUM 9.2 9.2  --  9.8  --  9.7  CREATININE 1.74* 1.57* 2.00* 1.74* 1.60* 1.66*  GFRNONAA 44* 49*  --   --   --   --     LIVER FUNCTION TESTS: Recent Labs    07/05/20 0700 11/02/20 1141 05/08/21 1009  BILITOT 1.1 0.8 0.8  AST 45* 22 13  ALT 32 17 9  ALKPHOS 104 107 95  PROT 7.3 7.6 8.1  ALBUMIN 4.1 4.3 4.3    TUMOR MARKERS: Recent Labs    11/02/20 1141 05/08/21 1009  AFPTM 9.1* 7.1*    Assessment and Plan:  Nearly 1 year status post repeat ablation of a left renal neoplasm compatible with renal cell carcinoma by imaging.  Lesion was treated for the  second time for local regional recurrence 07/05/2020.  Surveillance MRI confirms no residual abnormality or suspicious enhancement.  Stable ablation defect.  He remains asymptomatic.  Overall he is doing very well.     plan: Scheduled for repeat surveillance MRI in 6 months of life along Hospital.  Electronically Signed: Quadarius Henton 05/31/2021, 10:51 AM   I spent a total of    40 Minutes in remote  clinical consultation, greater than 50% of which was counseling/coordinating care for this patient status post left renal cryoablation.    Visit type: Audio only (telephone). Audio (no video) only due to patient's lack of internet/smartphone capability. Alternative for in-person consultation at [facility] [address] [place], [place], [place]. This visit type was conducted due to national recommendations for restrictions regarding the COVID-19 Pandemic (e.g. social distancing).  This format is felt to be most appropriate for this patient at this time.  All issues noted in this document were discussed and addressed.

## 2021-06-21 ENCOUNTER — Other Ambulatory Visit: Payer: Self-pay | Admitting: Hospitalist

## 2021-06-21 DIAGNOSIS — K766 Portal hypertension: Secondary | ICD-10-CM

## 2021-06-21 DIAGNOSIS — K7031 Alcoholic cirrhosis of liver with ascites: Secondary | ICD-10-CM

## 2021-06-21 DIAGNOSIS — I864 Gastric varices: Secondary | ICD-10-CM

## 2021-10-29 ENCOUNTER — Ambulatory Visit
Admission: RE | Admit: 2021-10-29 | Discharge: 2021-10-29 | Disposition: A | Discharge status: Home / Self Care | Payer: Medicaid Other | Source: Ambulatory Visit | Attending: Hospitalist | Admitting: Hospitalist

## 2021-10-29 DIAGNOSIS — K7031 Alcoholic cirrhosis of liver with ascites: Secondary | ICD-10-CM

## 2021-10-29 DIAGNOSIS — I864 Gastric varices: Secondary | ICD-10-CM

## 2021-10-29 DIAGNOSIS — K766 Portal hypertension: Secondary | ICD-10-CM

## 2021-10-29 MED ORDER — GADOBENATE DIMEGLUMINE 529 MG/ML IV SOLN
20.0000 mL | Freq: Once | INTRAVENOUS | Status: AC | PRN
  Administered 2021-10-29: 20 mL via INTRAVENOUS

## 2021-11-13 ENCOUNTER — Encounter (HOSPITAL_COMMUNITY): Payer: Self-pay | Admitting: Emergency Medicine

## 2021-11-13 ENCOUNTER — Emergency Department (HOSPITAL_COMMUNITY): Payer: Medicaid Other

## 2021-11-13 ENCOUNTER — Emergency Department (HOSPITAL_COMMUNITY)
Admission: EM | Admit: 2021-11-13 | Discharge: 2021-11-13 | Disposition: A | Discharge status: Home / Self Care | Payer: Medicaid Other | Attending: Emergency Medicine | Admitting: Emergency Medicine

## 2021-11-13 ENCOUNTER — Other Ambulatory Visit: Payer: Self-pay

## 2021-11-13 DIAGNOSIS — R790 Abnormal level of blood mineral: Secondary | ICD-10-CM | POA: Diagnosis not present

## 2021-11-13 DIAGNOSIS — R600 Localized edema: Secondary | ICD-10-CM | POA: Diagnosis not present

## 2021-11-13 DIAGNOSIS — R1084 Generalized abdominal pain: Secondary | ICD-10-CM | POA: Diagnosis not present

## 2021-11-13 DIAGNOSIS — E876 Hypokalemia: Secondary | ICD-10-CM | POA: Diagnosis not present

## 2021-11-13 DIAGNOSIS — I110 Hypertensive heart disease with heart failure: Secondary | ICD-10-CM | POA: Diagnosis not present

## 2021-11-13 DIAGNOSIS — I509 Heart failure, unspecified: Secondary | ICD-10-CM | POA: Insufficient documentation

## 2021-11-13 DIAGNOSIS — Z79899 Other long term (current) drug therapy: Secondary | ICD-10-CM | POA: Insufficient documentation

## 2021-11-13 DIAGNOSIS — M7989 Other specified soft tissue disorders: Secondary | ICD-10-CM | POA: Diagnosis present

## 2021-11-13 LAB — COMPREHENSIVE METABOLIC PANEL
ALT: 10 U/L (ref 0–44)
AST: 19 U/L (ref 15–41)
Albumin: 3.6 g/dL (ref 3.5–5.0)
Alkaline Phosphatase: 91 U/L (ref 38–126)
Anion gap: 13 (ref 5–15)
BUN: 17 mg/dL (ref 8–23)
CO2: 24 mmol/L (ref 22–32)
Calcium: 9 mg/dL (ref 8.9–10.3)
Chloride: 101 mmol/L (ref 98–111)
Creatinine, Ser: 1.55 mg/dL — ABNORMAL HIGH (ref 0.61–1.24)
GFR, Estimated: 50 mL/min — ABNORMAL LOW (ref >60)
Glucose, Bld: 95 mg/dL (ref 70–99)
Potassium: 2.8 mmol/L — ABNORMAL LOW (ref 3.5–5.1)
Sodium: 138 mmol/L (ref 135–145)
Total Bilirubin: 1.1 mg/dL (ref 0.3–1.2)
Total Protein: 7.9 g/dL (ref 6.5–8.1)

## 2021-11-13 LAB — CBC WITH DIFFERENTIAL/PLATELET
Abs Immature Granulocytes: 0.04 10*3/uL (ref 0.00–0.07)
Basophils Absolute: 0 10*3/uL (ref 0.0–0.1)
Basophils Relative: 0 %
Eosinophils Absolute: 0.1 10*3/uL (ref 0.0–0.5)
Eosinophils Relative: 1 %
HCT: 32.1 % — ABNORMAL LOW (ref 39.0–52.0)
Hemoglobin: 10.3 g/dL — ABNORMAL LOW (ref 13.0–17.0)
Immature Granulocytes: 1 %
Lymphocytes Relative: 15 %
Lymphs Abs: 1.1 10*3/uL (ref 0.7–4.0)
MCH: 24.7 pg — ABNORMAL LOW (ref 26.0–34.0)
MCHC: 32.1 g/dL (ref 30.0–36.0)
MCV: 77 fL — ABNORMAL LOW (ref 80.0–100.0)
Monocytes Absolute: 0.9 10*3/uL (ref 0.1–1.0)
Monocytes Relative: 12 %
Neutro Abs: 5.5 10*3/uL (ref 1.7–7.7)
Neutrophils Relative %: 71 %
Platelets: 179 10*3/uL (ref 150–400)
RBC: 4.17 MIL/uL — ABNORMAL LOW (ref 4.22–5.81)
RDW: 16.8 % — ABNORMAL HIGH (ref 11.5–15.5)
WBC: 7.6 10*3/uL (ref 4.0–10.5)
nRBC: 0 % (ref 0.0–0.2)

## 2021-11-13 LAB — TROPONIN I (HIGH SENSITIVITY)
Troponin I (High Sensitivity): 11 ng/L (ref <18)
Troponin I (High Sensitivity): 10 ng/L (ref <18)

## 2021-11-13 LAB — BRAIN NATRIURETIC PEPTIDE: B Natriuretic Peptide: 433.5 pg/mL — ABNORMAL HIGH (ref 0.0–100.0)

## 2021-11-13 LAB — LIPASE, BLOOD: Lipase: 28 U/L (ref 11–51)

## 2021-11-13 LAB — PROTIME-INR
INR: 1.2 (ref 0.8–1.2)
Prothrombin Time: 15.3 seconds — ABNORMAL HIGH (ref 11.4–15.2)

## 2021-11-13 LAB — AMMONIA: Ammonia: 30 umol/L (ref 9–35)

## 2021-11-13 MED ORDER — POTASSIUM CHLORIDE CRYS ER 20 MEQ PO TBCR
40.0000 meq | EXTENDED_RELEASE_TABLET | Freq: Once | ORAL | Status: AC
  Administered 2021-11-13: 40 meq via ORAL
  Filled 2021-11-13: qty 2

## 2021-11-13 MED ORDER — IOHEXOL 300 MG/ML  SOLN
100.0000 mL | Freq: Once | INTRAMUSCULAR | Status: AC | PRN
  Administered 2021-11-13: 100 mL via INTRAVENOUS

## 2021-11-13 MED ORDER — SODIUM CHLORIDE (PF) 0.9 % IJ SOLN
INTRAMUSCULAR | Status: AC
  Filled 2021-11-13: qty 50

## 2021-11-13 NOTE — ED Notes (Signed)
Patient walks out of room and says he is leaving. Explained to patient that PA is working on paperwork as we speak. Pt continues to walk out.

## 2021-11-13 NOTE — ED Notes (Signed)
O2 remained 95%-97% while ambulating. Provider aware.

## 2021-11-13 NOTE — ED Notes (Signed)
Pt refusing vitals.

## 2021-11-13 NOTE — ED Provider Notes (Signed)
[facility] DEPT Provider Note   CSN: [id number] Arrival date & time: 11/13/21  1137     History Chief Complaint  Patient presents with   Leg Swelling    John [name] is a 66 y.o. male with history of CHF, stroke, cirrhosis, EtOH abuse, hypertension, ascites presents the emergency department for evaluation of bilateral leg swelling for the past week as well as abdominal swelling.  The patient reports he is more abdominal swelling and pain without nausea or vomiting.  He reports his pain hurts mainly whenever he is laying down or on his side.  He denies any chest pain and mentions he does have some shortness of breath upon exertion.  He denies any numbness, tingling, fever, cough, injury, hematochezia, melena, diarrhea, constipation, dysuria, hematuria.  Denies any trauma to the area.  He reports he has been compliant with his medications.    HPI     Home Medications Prior to Admission medications   Medication Sig Start Date End Date Taking? Authorizing Provider  allopurinol (ZYLOPRIM) 100 MG tablet Take 1 tablet (100 mg total) by mouth daily. 05/12/18   Eugenie Filler, MD  amLODipine (NORVASC) 10 MG tablet Take 1 tablet (10 mg total) by mouth daily. 05/25/15   Debbe Odea, MD  atorvastatin (LIPITOR) 40 MG tablet Take 40 mg by mouth daily. 04/29/19   [provider]  colchicine 0.6 MG tablet Take 1 tablet (0.6 mg total) by mouth daily. 05/07/18   Eugenie Filler, MD  doxycycline (VIBRA-TABS) 100 MG tablet Take 1 tablet (100 mg total) by mouth 2 (two) times daily. 12/20/20   Edrick Kins, DPM  FLOVENT HFA 220 MCG/ACT inhaler Inhale 2 puffs into the lungs in the morning and at bedtime. 01/27/19   [provider]  folic acid (FOLVITE) 1 MG tablet Take 1 tablet (1 mg total) by mouth daily. 05/07/18   Eugenie Filler, MD  furosemide (LASIX) 40 MG tablet Take 40 mg by mouth daily.    [provider]  hydrALAZINE  (APRESOLINE) 50 MG tablet Take 1 tablet (50 mg total) by mouth 3 (three) times daily. Patient taking differently: Take 50 mg by mouth in the morning and at bedtime. 05/25/15   Debbe Odea, MD  ketoconazole (NIZORAL) 2 % shampoo Apply 1 application topically See admin instructions. Prescribed 06/26/20: Apply topically in shower once every 3 days as needed for itching for 8 weeks. 2 X WEEK    [provider]  lactulose (CHRONULAC) 10 GM/15ML solution Take 20 g by mouth every other day. 11/01/16   [provider]  omeprazole (PRILOSEC) 40 MG capsule TAKE ONE CAPSULE BY MOUTH DAILY Patient taking differently: Take 40 mg by mouth daily as needed (acid reflux). 07/02/18   Ladene Artist, MD  oxyCODONE-acetaminophen (PERCOCET) 5-325 MG tablet Take 1 tablet by mouth every 6 (six) hours as needed for severe pain. 12/20/20   Edrick Kins, DPM  PROAIR HFA 108 [phone] Base) MCG/ACT inhaler Inhale 2 puffs into the lungs every 6 (six) hours as needed for wheezing or shortness of breath. 10/02/17   [provider]  spironolactone (ALDACTONE) 25 MG tablet Take 25 mg by mouth daily as needed (swelling). 06/13/18   [provider]      Allergies    Penicillins    Review of Systems   Review of Systems  Constitutional:  Negative for chills and fever.  Respiratory:  Positive for shortness of breath. Negative for cough.  Cardiovascular:  Positive for leg swelling. Negative for chest pain and palpitations.  Gastrointestinal:  Positive for abdominal pain. Negative for blood in stool, constipation, diarrhea, nausea and vomiting.  Genitourinary:  Negative for dysuria and hematuria.  Musculoskeletal:  Negative for back pain.    Physical Exam Updated Vital Signs BP 116/77   Pulse 77   Temp 98.4 F (36.9 C) (Oral)   Resp (!) 21   Ht '5\' 8"'$  (1.727 m)   Wt 108 kg   SpO2 97%   BMI 36.20 kg/m  Physical Exam Vitals and nursing note reviewed.  Constitutional:      General: He is not  in acute distress.    Appearance: Normal appearance. He is not ill-appearing or toxic-appearing.  HENT:     Head: Normocephalic and atraumatic.  Eyes:     General: No scleral icterus. Cardiovascular:     Rate and Rhythm: Normal rate and regular rhythm.  Pulmonary:     Effort: Pulmonary effort is normal.     Breath sounds: Normal breath sounds.  Abdominal:     General: Bowel sounds are normal.     Palpations: Abdomen is soft.     Comments: Abdomen is firm, however pliable.  Diffuse abdominal tenderness without any guarding or rebound.  Musculoskeletal:        General: No deformity.     Cervical back: Normal range of motion.     Right lower leg: Edema present.     Left lower leg: Edema present.     Comments: 2+ pitting edema seen in patient's bilateral lower extremities.  Compartments are firm, however they are pliable.  Sensation intact.  No overlying skin changes noted.  Skin:    General: Skin is warm and dry.  Neurological:     General: No focal deficit present.     Mental Status: He is alert. Mental status is at baseline.     ED Results / Procedures / Treatments   Labs (all labs ordered are listed, but only abnormal results are displayed) Labs Reviewed  CBC WITH DIFFERENTIAL/PLATELET - Abnormal; Notable for the following components:      Result Value   RBC 4.17 (*)    Hemoglobin 10.3 (*)    HCT 32.1 (*)    MCV 77.0 (*)    MCH 24.7 (*)    RDW 16.8 (*)    All other components within normal limits  COMPREHENSIVE METABOLIC PANEL - Abnormal; Notable for the following components:   Potassium 2.8 (*)    Creatinine, Ser 1.55 (*)    GFR, Estimated 50 (*)    All other components within normal limits  BRAIN NATRIURETIC PEPTIDE - Abnormal; Notable for the following components:   B Natriuretic Peptide 433.5 (*)    All other components within normal limits  PROTIME-INR - Abnormal; Notable for the following components:   Prothrombin Time 15.3 (*)    All other components within  normal limits  LIPASE, BLOOD  AMMONIA  TROPONIN I (HIGH SENSITIVITY)  TROPONIN I (HIGH SENSITIVITY)    EKG None  Radiology CT ABDOMEN PELVIS W CONTRAST  Result Date: 11/13/2021 CLINICAL DATA:  Acute abdominal pain and swelling without nausea or vomiting. EXAM: CT ABDOMEN AND PELVIS WITH CONTRAST TECHNIQUE: Multidetector CT imaging of the abdomen and pelvis was performed using the standard protocol following bolus administration of intravenous contrast. RADIATION DOSE REDUCTION: This exam was performed according to the departmental dose-optimization program which includes automated exposure control, adjustment of the mA and/or kV according  to patient size and/or use of iterative reconstruction technique. CONTRAST:  11m OMNIPAQUE IOHEXOL 300 MG/ML  SOLN COMPARISON:  Abdominal CT 06/13/2016.  Abdominal MRI 10/29/2021. FINDINGS: Lower chest: Small right-greater-than-left pleural effusions with dependent atelectasis at both lung bases. There is a small pericardial effusion. Atherosclerosis of the aorta and coronary arteries. Distal esophageal varices are again noted. Hepatobiliary: Changes of advanced hepatic cirrhosis are again noted with contour irregularity of the liver and relative enlargement of the left and caudate lobes. No focal areas of abnormal arterial phase enhancement are identified. No evidence of gallstones, gallbladder wall thickening or biliary dilatation. Pancreas: Unremarkable. No pancreatic ductal dilatation or surrounding inflammatory changes. Spleen: Stable mild splenomegaly without focal abnormality. Adrenals/Urinary Tract: Both adrenal glands appear normal. Stable cortical scarring in the upper pole of the left kidney at site of previous ablation. No evidence of renal mass, urinary tract calculus or hydronephrosis. The bladder appears unremarkable for its degree of distention. Stomach/Bowel: No enteric contrast administered. As above, distal esophageal varices. The stomach appears  unremarkable for its degree of distension. No evidence of bowel wall thickening, distention or surrounding inflammatory change. The appendix appears normal. Small bowel extends into a small periumbilical hernia, without evidence of incarceration or obstruction. Vascular/Lymphatic: Stable mildly prominent lymph nodes in the gastrohepatic ligament and porta hepatis, likely reactive. No progressive adenopathy identified. Aortic and branch vessel atherosclerosis without acute vascular findings or aneurysm. Distal esophageal varices. Reproductive: The prostate gland and seminal vesicles appear unremarkable. Other: Surgical clips or embolization coils are again noted posterior to the gastric cardia. Mild perirectal edema without ascites or focal extraluminal fluid collection. Generalized flank edema which has developed since remote CT. Musculoskeletal: No acute or significant osseous findings. Multilevel lumbar spondylosis with progressive disc space narrowing and osseous foraminal narrowing bilaterally at L5-S1. IMPRESSION: 1. No clear acute findings or explanation for the patient's symptoms. 2. Cirrhosis and portal hypertension manifesting as splenomegaly and distal esophageal varices. No focal hepatic lesions identified. 3. Increased bilateral flank edema compared with 2018 CT with perirectal edema and small bilateral pleural effusions. Stable small pericardial effusion. 4. Stable small umbilical hernia containing small bowel. No evidence of incarceration or obstruction. 5.  Aortic Atherosclerosis (ICD10-I70.0). Electronically Signed   By: WRichardean SaleM.D.   On: 11/13/2021 15:59   DG Chest 2 View  Result Date: 11/13/2021 CLINICAL DATA:  Shortness of breath with history of CHF EXAM: CHEST - 2 VIEW COMPARISON:  Radiographs 07/05/2020 FINDINGS: Similar mild enlargement of the cardiac silhouette. New pulmonary vascular congestion. Mild hazy interstitial and airspace opacities in the lung bases are compatible with  pulmonary edema. Small bilateral pleural effusions. Aortic calcifications. Embolization coils left upper quadrant. IMPRESSION: 1. Mild pulmonary edema. 2. Cardiomegaly and pulmonary vascular congestion. Electronically Signed   By: TPlacido SouM.D.   On: 11/13/2021 12:36    Procedures Procedures   Medications Ordered in ED Medications  iohexol (OMNIPAQUE) 300 MG/ML solution 100 mL (100 mLs Intravenous Contrast Given 11/13/21 1535)  potassium chloride SA (KLOR-CON M) CR tablet 40 mEq (40 mEq Oral Given 11/13/21 1848)    ED Course/ Medical Decision Making/ A&P                           Medical Decision Making Amount and/or Complexity of Data Reviewed Labs: ordered. Radiology: ordered.  Risk Prescription drug management.   66year old male presents the emergency department for evaluation of bilateral leg swelling and abdominal swelling and pain  over the past week.  Differential diagnosis includes is not limited to ascites, worsening cirrhosis, DVT, CHF exacerbation, cellulitis, intra-abdominal etiology.  Vital signs are unremarkable.  Physical exam as listed above.  CT and labs ordered in triage.  I independently reviewed and interpreted the patient's labs.  Troponin at 11 with repeat of 10, delta -1.  PT/INR shows slightly elevated PT of 15.3 otherwise normal INR.  Normal lipase.  CBC shows no [place] ptosis however the patient's hemoglobin is 10.3.  6 months ago was 13.9.  He denies any melena or hematochezia.  BNP elevated at 433.5.  Ammonia normal.  CMP shows potassium at 2.8.  Patient's creatinine 1.55 which appears to be around his baseline.  No other electrolyte or LFT abnormalities.    CT imaging shows 1. No clear acute findings or explanation for the patient's symptoms. 2. Cirrhosis and portal hypertension manifesting as splenomegaly and distal esophageal varices. No focal hepatic lesions identified. 3. Increased bilateral flank edema compared with 2018 CT with perirectal edema and  small bilateral pleural effusions. Stable small pericardial effusion. 4. Stable small umbilical hernia containing small bowel. No evidence of incarceration or obstruction. 5.  Aortic Atherosclerosis   Chest x-ray shows mild pulmonary edema.  Cardiomegaly and pulmonary vascular congestion.  Ambulatory oxygenation was checked and patient remained at 95 to 97% while ambulating on room air.  I discussed this case with my attending.  He did not recommend any additional interventions such as IV Lasix.  No ascites seen on CT.  Normal LFTs.  He reports that the patient is safe for discharge with follow-up with his PCP.  Patient initially eloped but returned and wanted to know his results.  Was going to perform a POC Hemoccult although patient wanted to leave and did not want any more testing.  I discussed with him his lab and imaging findings.  Discussed with him that he needs to follow-up with his primary care doctor.  He verbalized understanding and agrees to the plan.  Patient discharged.  I discussed this case with my attending physician who cosigned this note including patient's presenting symptoms, physical exam, and planned diagnostics and interventions. Attending physician stated agreement with plan or made changes to plan which were implemented.   Final Clinical Impression(s) / ED Diagnoses Final diagnoses:  Leg edema  Generalized abdominal pain    Rx / DC Orders ED Discharge Orders     None         [name], PA-C 11/15/21 1157    Fredia Sorrow, MD 11/16/21 [phone]

## 2021-11-13 NOTE — ED Notes (Signed)
Pt has removed all equipped and sitting at doorway stating he is ready to go. PA aware

## 2021-11-13 NOTE — ED Triage Notes (Signed)
Pt reports swelling in bilateral legs. Pt reports abd pain as well. States he takes lasix but it is not working.

## 2021-11-13 NOTE — ED Notes (Signed)
Charge nurse asked patient to wait on his paperwork, pt at desk waiting.

## 2021-11-13 NOTE — Discharge Instructions (Addendum)
You were seen today in the Emergency Department for evaluation of your abdominal pain and fluid retention in your legs.  I recommend seeing your PCP this week for reevaluation.  Your BNP is slightly elevated however you are not needing oxygen.  He did have some lower potassium, for this we were able to give you some potassium replenishment.  Please follow-up to have your potassium rechecked in the next few days as your Lasix may be worsening this problem.  If you have any concern, new or worsening symptoms, return to the emergency department for reevaluation.  Contact a doctor if: Treatment is not working. You have heart, liver, or kidney disease and have symptoms of edema. You have sudden and unexplained weight gain. Get help right away if: You have shortness of breath or chest pain. You cannot breathe when you lie down. You have pain, redness, or warmth in the swollen areas. You have heart, liver, or kidney disease and get edema all of a sudden. You have a fever and your symptoms get worse all of a sudden. These symptoms may be an emergency. Get help right away. Call 911. Do not wait to see if the symptoms will go away. Do not drive yourself to the hospital.

## 2021-11-13 NOTE — ED Provider Triage Note (Signed)
Emergency Medicine Provider Triage Evaluation Note  John [name] , a 66 y.o. male  was evaluated in triage.  Pt complains of bilateral leg swelling for the past week. Additionally, he is complaining of abdominal swelling and pain without nausea of vomiting. He reports he has abdominal pain whenever he is lying down. Denies any chest pain, but mentions he does have some SOB. Denies any numbness, tingling, fever, or cough.  Review of Systems  Positive:  Negative:   Physical Exam  BP 121/89 (BP Location: Right Arm)   Pulse 83   Temp 98.1 F (36.7 C) (Oral)   Resp 18   SpO2 95%  Gen:   Awake, no distress   Resp:  Normal effort  MSK:   Moves extremities without difficulty  Other:  2+ pitting edema. Diffuse abdominal tenderness. Abdomen is firm, but pliable.   Medical Decision Making  Medically screening exam initiated at 12:16 PM.  Appropriate orders placed.  John [name] was informed that the remainder of the evaluation will be completed by another provider, this initial triage assessment does not replace that evaluation, and the importance of remaining in the ED until their evaluation is complete.  Will order CHF workup   John [name], John [name] 11/13/21 1218

## 2021-11-14 ENCOUNTER — Other Ambulatory Visit: Payer: Self-pay | Admitting: Interventional Radiology

## 2021-11-14 DIAGNOSIS — N2889 Other specified disorders of kidney and ureter: Secondary | ICD-10-CM

## 2021-12-10 ENCOUNTER — Encounter (HOSPITAL_COMMUNITY): Payer: Self-pay | Admitting: *Deleted

## 2021-12-17 ENCOUNTER — Ambulatory Visit: Payer: Medicaid Other | Admitting: Internal Medicine

## 2021-12-26 ENCOUNTER — Encounter: Payer: Self-pay | Admitting: Gastroenterology

## 2021-12-26 ENCOUNTER — Ambulatory Visit (INDEPENDENT_AMBULATORY_CARE_PROVIDER_SITE_OTHER): Payer: Medicare (Managed Care) | Admitting: Gastroenterology

## 2021-12-26 ENCOUNTER — Other Ambulatory Visit (INDEPENDENT_AMBULATORY_CARE_PROVIDER_SITE_OTHER): Payer: Medicare (Managed Care)

## 2021-12-26 VITALS — BP 118/80 | HR 93 | Ht 68.0 in | Wt 239.6 lb

## 2021-12-26 DIAGNOSIS — E876 Hypokalemia: Secondary | ICD-10-CM

## 2021-12-26 DIAGNOSIS — K7031 Alcoholic cirrhosis of liver with ascites: Secondary | ICD-10-CM

## 2021-12-26 LAB — BASIC METABOLIC PANEL
BUN: 11 mg/dL (ref 6–23)
CO2: 28 mEq/L (ref 19–32)
Calcium: 9.9 mg/dL (ref 8.4–10.5)
Chloride: 101 mEq/L (ref 96–112)
Creatinine, Ser: 1.68 mg/dL — ABNORMAL HIGH (ref 0.40–1.50)
GFR: 42.51 mL/min — ABNORMAL LOW (ref >60.00)
Glucose, Bld: 87 mg/dL (ref 70–99)
Potassium: 4.4 mEq/L (ref 3.5–5.1)
Sodium: 139 mEq/L (ref 135–145)

## 2021-12-26 NOTE — Patient Instructions (Signed)
Your provider has requested that you go to the basement level for lab work before leaving today. Press "B" on the elevator. The lab is located at the first door on the left as you exit the elevator.  The [facility] providers would like to encourage you to use MYCHART to communicate with providers for non-urgent requests or questions.  Due to long hold times on the telephone, sending your provider a message by MYCHART may be a faster and more efficient way to get a response.  Please allow 48 business hours for a response.  Please remember that this is for non-urgent requests.   Due to recent changes in healthcare laws, you may see the results of your imaging and laboratory studies on MyChart before your provider has had a chance to review them.  We understand that in some cases there may be results that are confusing or concerning to you. Not all laboratory results come back in the same time frame and the provider may be waiting for multiple results in order to interpret others.  Please give us 48 hours in order for your provider to thoroughly review all the results before contacting the office for clarification of your results.   Thank you for choosing me and  Gastroenterology.  Malcolm T. Stark, Jr., MD., FACG  

## 2021-12-26 NOTE — Progress Notes (Signed)
     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  No ascites and no focal hepatic lesion noted on recent CT AP. CHF with volume overload GERD with LA Grade A esophagitis and an esophageal stricture Personal history of tubular adenoma and a serrated adenoma in November 2019, deferring surveillance colonoscopy   Recommendations    Continue current diuretics for management of cirrhosis, previously had ascites. PCP to manage CHF and volume overload BMP today Continue omeprazole 40 mg daily REV in 6 months   HPI    This is a 66 year old male returning for follow-up of decompensated cirrhosis.  He had an [facility] ED visit for lower extremity edema on July 18.  He states he has had a 16 pound weight loss since that visit with decreased leg edema.  BNP was elevated at 433.5.  Potassium was 2.8.  CT AP showed increased bilateral flank edema compared with 2018 CT with perirectal edema and small bilateral pleural effusions. Stable small pericardial effusion.His reflux symptoms are under good control.    Labs / Imaging    CT AP IMPRESSION 11/13/2021: 1. No clear acute findings or explanation for the patient's symptoms. 2. Cirrhosis and portal hypertension manifesting as splenomegaly and distal esophageal varices. No focal hepatic lesions identified. 3. Increased bilateral flank edema compared with 2018 CT with perirectal edema and small bilateral pleural effusions. Stable small pericardial effusion. 4. Stable small umbilical hernia containing small bowel. No evidence of incarceration or obstruction. 5.  Aortic Atherosclerosis      Latest Ref Rng & Units 11/13/2021   12:50 PM 05/08/2021   10:09 AM 11/02/2020   11:41 AM  Hepatic Function  Total Protein 6.5 - 8.1 g/dL 7.9  8.1  7.6   Albumin 3.5 - 5.0 g/dL 3.6  4.3  4.3   AST 15 - 41 U/L $Remo'19  13  22   'XTStB$ ALT 0 - 44 U/L $Remo'10  9   17   'ulfLU$ Alk Phosphatase 38 - 126 U/L 91  95  107   Total Bilirubin 0.3 - 1.2 mg/dL 1.1  0.8  0.8        Latest Ref Rng & Units 11/13/2021   12:50 PM 05/08/2021   10:09 AM 12/07/2020   10:17 AM  CBC  WBC 4.0 - 10.5 K/uL 7.6  8.5    Hemoglobin 13.0 - 17.0 g/dL 10.3  13.9  14.3   Hematocrit 39.0 - 52.0 % 32.1  42.8  42.0   Platelets 150 - 400 K/uL 179  187.0      Current Medications, Allergies, Past Medical History, Past Surgical History, Family History and Social History were reviewed in [employer] record.   Physical Exam: General: Well developed, well nourished, no acute distress Head: Normocephalic and atraumatic Eyes: Sclerae anicteric, EOMI Ears: Normal auditory acuity Mouth: Not examined Lungs: Clear throughout to auscultation Heart: Regular rate and rhythm; no murmurs, rubs or bruits Abdomen: Soft, protuberant, non tender and non distended. No masses, hepatosplenomegaly or hernias noted. Normal Bowel sounds Rectal: Not done Musculoskeletal: Symmetrical with no gross deformities  Pulses:  Normal pulses noted Extremities: No clubbing, cyanosis, or deformities noted. 1+ pretibial edema Neurological: Alert oriented x 4, grossly nonfocal Psychological:  Alert and cooperative. Normal mood and affect   Kenroy Timberman T. Fuller Plan, MD 12/26/2021, 10:03 AM

## 2021-12-28 ENCOUNTER — Telehealth: Payer: Self-pay

## 2021-12-28 ENCOUNTER — Ambulatory Visit: Payer: Medicaid Other | Admitting: Internal Medicine

## 2021-12-28 NOTE — Progress Notes (Deleted)
Cardiology Office Note:    Date:  12/28/2021   ID:  John [name], DOB [birth year], MRN [id number]  PCP:  Merlene Laughter, MD   [place] Providers Cardiologist:  None { Click to update primary MD,subspecialty MD or APP then REFRESH:1}    Referring MD: [facility]*   No chief complaint on file. ***  History of Present Illness:    John [name] is a 66 y.o. male with a hx of asthma, HTN, heart failure ***, echo 07/09/2015- normal EF, no valve dx. Bubble study negative for PFO/ASD.  Past Medical History:  Diagnosis Date   Allergy    Arthritis    oa back   Ascites    resolved since 2018   Asthma    Cataract    both eyes   CHF (congestive heart failure) ([place]) 2001   sees primary for   Chronic back pain    Chronic kidney disease    ckd 3   Cirrhosis ([place]) resolved since 2018   Coronary artery disease    nonobstrucrtibe   Dyspnea    with heavy exertion   ETOH abuse    Gastric varices    GERD (gastroesophageal reflux disease)    Gout    Hepatitis    hepatitic c 2018 24 week tx with ecuplipsa, no hepatitic c detected after tx   History of blood transfusion 8-9- yrs ago   Hypertension    Optic neuropathy, left    PAD (peripheral artery disease) ([place])    slight  primary manages   Polysubstance abuse ([place])    Renal cell carcinoma ([place]) 2016, 2018 and 2022   left ablation   Seizures [facility])    age 7 none since   Sickle cell trait ([place])    Stroke ([place]) 1998   "light no problems with"   Uses walker 12/04/2020   Wears dentures    Wears glasses     Past Surgical History:  Procedure Laterality Date   ARTHRODESIS METATARSALPHALANGEAL JOINT (MTPJ) Right 12/07/2020   Procedure: ARTHRODESIS METATARSALPHALANGEAL JOINT (MTPJ);  Surgeon: Edrick Kins, DPM;  Location: [place];  Service: Podiatry;  Laterality: Right;   COLONOSCOPY  2021   ESOPHAGOGASTRODUODENOSCOPY N/A 08/14/2014   Procedure: ESOPHAGOGASTRODUODENOSCOPY  (EGD);  Surgeon: Ladene Artist, MD;  Location: [facility];  Service: Endoscopy;  Laterality: N/A;   ESOPHAGOGASTRODUODENOSCOPY (EGD) WITH PROPOFOL N/A 06/03/2017   Procedure: ESOPHAGOGASTRODUODENOSCOPY (EGD) WITH PROPOFOL;  Surgeon: Ladene Artist, MD;  Location: [facility];  Service: Endoscopy;  Laterality: N/A;   HALLUX FUSION Left 07/28/2020   Procedure: HALLUX FUSION MPJ;  Surgeon: Edrick Kins, DPM;  Location: [facility];  Service: Podiatry;  Laterality: Left;   HAMMER TOE SURGERY Left 07/28/2020   Procedure: HAMMER TOE CORRECTION  2,3,AND4 LEFT FOOT;  Surgeon: Edrick Kins, DPM;  Location: [place];  Service: Podiatry;  Laterality: Left;   HAMMER TOE SURGERY Right 12/07/2020   Procedure: HAMMER TOE CORRECTION  2-4;  Surgeon: Edrick Kins, DPM;  Location: [place];  Service: Podiatry;  Laterality: Right;   IR RADIOLOGIST EVAL & MGMT  09/25/2016   IR RADIOLOGIST EVAL & MGMT  12/10/2016   IR RADIOLOGIST EVAL & MGMT  03/25/2018   IR RADIOLOGIST EVAL & MGMT  03/24/2019   IR RADIOLOGIST EVAL & MGMT  05/17/2020   IR RADIOLOGIST EVAL & MGMT  08/03/2020   IR RADIOLOGIST EVAL & MGMT  11/22/2020   IR RADIOLOGIST EVAL & MGMT  05/31/2021   METATARSAL HEAD EXCISION Left 07/28/2020   Procedure: METATARSAL HEAD EXCISION TOES 2,3,AND 4  LEFT FOOT;  Surgeon: Edrick Kins, DPM;  Location: [place];  Service: Podiatry;  Laterality: Left;   METATARSAL OSTEOTOMY Right 12/07/2020   Procedure: METATARSAL OSTEOTOMY TOES 2-4 RIGHT FOOT;  Surgeon: Edrick Kins, DPM;  Location: [place];  Service: Podiatry;  Laterality: Right;   none     RADIOFREQUENCY ABLATION Left 11/08/2016   Procedure: LEFT RENAL CRYOABLATION;  Surgeon: Greggory Keen, MD;  [facility];  Service: Anesthesiology;  Laterality: Left;   RADIOLOGY WITH ANESTHESIA N/A 08/15/2014   Procedure: RADIOLOGY WITH ANESTHESIA;  Surgeon: Greggory Keen,  MD;  Location: [place];  Service: Radiology;  Laterality: N/A;   RADIOLOGY WITH ANESTHESIA Left 07/05/2020   Procedure: CT CRYOABLATION;  Surgeon: Greggory Keen, MD;  Location[facility];  Service: Anesthesiology;  Laterality: Left;   UPPER GASTROINTESTINAL ENDOSCOPY      Current Medications: No outpatient medications have been marked as taking for the 12/28/21 encounter (Appointment) with Janina Mayo, MD.     Allergies:   Penicillins   Social History   Socioeconomic History   Marital status: Single    Spouse name: Not on file   Number of children: 0   Years of education: Not on file   Highest education level: Not on file  Occupational History   Occupation: retired  Tobacco Use   Smoking status: Former    Packs/day: 0.30    Years: 20.00    Total pack years: 6.00    Types: Cigarettes    Quit date: 04/30/2011    Years since quitting: 10.6   Smokeless tobacco: Never   Tobacco comments:    2015  Vaping Use   Vaping Use: Never used  Substance and Sexual Activity   Alcohol use: No    Alcohol/week: 0.0 standard drinks of alcohol    Comment: stopped 8-9 yrs ago   Drug use: Yes    Types: Cocaine, Marijuana    Comment: cocaine last used 11-28-2020 and marijuan last used 11-28-2020   Sexual activity: Not Currently  Other Topics Concern   Not on file  Social History Narrative   Lives with niece in a 2 story home.     On disability since 2015 for low back pain.  Used to work Statistician.    Highest level of education: 11th grade   Social Determinants of Health   Financial Resource Strain: Not on file  Food Insecurity: Not on file  Transportation Needs: Not on file  Physical Activity: Not on file  Stress: Not on file  Social Connections: Not on file     Family History: The patient's ***family history includes Diabetes in his brother and mother; Heart disease in his mother; Hypertension in his brother, father, mother, and sister; Kidney disease in his brother and mother;  Stomach cancer in his father; Ulcers in his father. There is no history of Colon cancer, Esophageal cancer, or Rectal cancer.  ROS:   Please see the history of present illness.    *** All other systems reviewed and are negative.  EKGs/Labs/Other Studies Reviewed:    The following studies were reviewed today: ***  EKG:  EKG is *** ordered today.  The ekg ordered today demonstrates ***  Recent Labs: 11/13/2021: ALT 10; B Natriuretic Peptide 433.5; Hemoglobin 10.3; Platelets 179 12/26/2021: BUN 11; Creatinine, Ser  1.68; Potassium 4.4; Sodium 139  Recent Lipid Panel    Component Value Date/Time   CHOL 125 07/08/2015 0235   TRIG 100 07/08/2015 0235   HDL 39 (L) 07/08/2015 0235   CHOLHDL 3.2 07/08/2015 0235   VLDL 20 07/08/2015 0235   LDLCALC 66 07/08/2015 0235     Risk Assessment/Calculations:   {Does this patient have ATRIAL FIBRILLATION?:[phone]}  No BP recorded.  {Refresh Note OR Click here to enter BP  :1}***         Physical Exam:    VS:  There were no vitals taken for this visit.    Wt Readings from Last 3 Encounters:  12/26/21 239 lb 9.6 oz (108.7 kg)  11/13/21 238 lb 1.6 oz (108 kg)  05/08/21 238 lb (108 kg)     GEN: *** Well nourished, well developed in no acute distress HEENT: Normal NECK: No JVD; No carotid bruits LYMPHATICS: No lymphadenopathy CARDIAC: ***RRR, no murmurs, rubs, gallops RESPIRATORY:  Clear to auscultation without rales, wheezing or rhonchi  ABDOMEN: Soft, non-tender, non-distended MUSCULOSKELETAL:  No edema; No deformity  SKIN: Warm and dry NEUROLOGIC:  Alert and oriented x 3 PSYCHIATRIC:  Normal affect   ASSESSMENT:    No diagnosis found. PLAN:    In order of problems listed above:  ***      {Are you ordering a CV Procedure (e.g. stress test, cath, DCCV, TEE, etc)?   Press F2        :[id number]}    Medication Adjustments/Labs and Tests Ordered: Current medicines are reviewed at length with the patient today.  Concerns  regarding medicines are outlined above.  No orders of the defined types were placed in this encounter.  No orders of the defined types were placed in this encounter.   There are no Patient Instructions on file for this visit.   Signed, Janina Mayo, MD  12/28/2021 1:33 PM    [place]

## 2021-12-28 NOTE — Telephone Encounter (Signed)
Called pt to have him come sooner. He asked "I have an appointment today?" I asked if he would be able to make it or if we would have to reschedule. He wants the appointment rescheduled because "I am not ready." Pt also asked me to call [facility] to get transportation set up. Pt provided the number [phone] to call.  Called [facility], spoke with Elmyra Ricks, she transferred me to the scheduler. No answer at this time. Left a message for her to return he call. Pt is willing to come the morning of 9/15 anytime. Unable to schedule the appointment at this time due to no answer from the coordinator.

## 2022-01-07 ENCOUNTER — Telehealth: Payer: Self-pay | Admitting: Gastroenterology

## 2022-01-07 NOTE — Telephone Encounter (Signed)
Left detailed message on pt identified voice mail that his potassium is now normal and his Cr is chronically elevated.

## 2022-01-07 NOTE — Telephone Encounter (Signed)
Inbound call from patient stating he got a letter in the mail about his results. Patient is requesting a call back to discuss. Please advise.

## 2022-01-10 ENCOUNTER — Encounter: Payer: Self-pay | Admitting: Interventional Radiology

## 2022-01-31 ENCOUNTER — Ambulatory Visit: Payer: Medicaid Other | Attending: Internal Medicine | Admitting: Internal Medicine

## 2022-07-29 NOTE — Progress Notes (Deleted)
Cardiology Office Note:   Date:  07/29/2022  ID:  MAHENDER SLIDER, DOB June 04, 1955, MRN [id number]  History of Present Illness:   John [name] is a 67 y.o. 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 Arlyss Gandy for further evaluation of HF.  Today, ***   ROS: ***  Studies Reviewed:    EKG:  ***  Cardiac Studies & Procedures       ECHOCARDIOGRAM  ECHOCARDIOGRAM LIMITED BUBBLE STUDY 07/09/2015  Narrative *[facility]* [address], [place] [address] [phone]  ------------------------------------------------------------------- Transthoracic Echocardiography  Patient:    Mai, Nicolich MR #:       [id number] Study Date: 07/09/2015 Gender:     M Age:        42 Height:     180.3 cm Weight:     81.6 kg BSA:        2.03 m^2 Pt. Status: Room:  Jesse Fall, Margaree Mackintosh ATTENDING    Veryl Speak [id number] Coral Else [id number] SONOGRAPHER  Johny Chess, RDCS, CCT PERFORMING   [facility], Inpatient  cc:  ------------------------------------------------------------------- LV EF: 70% -   75%  ------------------------------------------------------------------- Indications:      Chest pain 786.51.  ------------------------------------------------------------------- History:   PMH:   Dyspnea.  Risk factors:  Elevated troponin. Polysubstance abuse. Hep C. Chronic kidney disease.  ------------------------------------------------------------------- Study Conclusions  - Left ventricle: The cavity size was normal. Wall thickness was increased in a pattern of moderate LVH. Systolic function was vigorous. The estimated ejection fraction was in the range of 70% to 75%. Wall motion was normal; there were no regional wall motion abnormalities. Doppler parameters are consistent  with abnormal left ventricular relaxation (grade 1 diastolic dysfunction). - Aortic valve: Mildly calcified annulus. Trileaflet. - Aortic root: The aortic root was mildly dilated. - Mitral valve: Mild calcification of papillary tips. - Left atrium: The atrium was mildly dilated. - Right atrium: Central venous pressure (est): 3 mm Hg. - Atrial septum: No definite secundum ASD or patent foramen ovale was identified. Limited images of agitated saline study do not indicate right to left shunting. - Tricuspid valve: There was physiologic regurgitation. - Pulmonary arteries: Systolic pressure could not be accurately estimated. - Pericardium, extracardiac: There was no pericardial effusion.  Impressions:  - Moderate LVH with LVEF 70-75%. Grade 1 diastolic dysfunction with normal estimated LV filling pressure. Mild left atrial enlargement. No definite secundum ASD or patent foramen ovale was identified. Limited images of agitated saline study do not indicate right to left shunting.  Transthoracic echocardiography.  M-mode, complete 2D, spectral Doppler, and color Doppler.  Birthdate:  Patient birthdate: 1956/03/30.  Age:  Patient is 67 yr old.  Sex:  Gender: male. BMI: 25.1 kg/m^2.  Blood pressure:     122/81  Patient status: Inpatient.  Study date:  Study date: 07/09/2015. Study time: 08:16 AM.  Location:  ICU/CCU  -------------------------------------------------------------------  ------------------------------------------------------------------- Left ventricle:  The cavity size was normal. Wall thickness was increased in a pattern of moderate LVH. Systolic function was vigorous. The estimated ejection fraction was in the range of 70% to 75%. Wall motion was normal; there were no regional wall motion abnormalities. Doppler parameters are consistent with abnormal left ventricular relaxation (grade 1 diastolic  dysfunction).  ------------------------------------------------------------------- Aortic valve:   Mildly calcified annulus. Trileaflet.  Doppler: There was  no significant regurgitation.  ------------------------------------------------------------------- Aorta:  Aortic root: The aortic root was mildly dilated.  ------------------------------------------------------------------- Mitral valve:  Mild calcification of papillary tips.  Doppler: There was no significant regurgitation.  ------------------------------------------------------------------- Left atrium:  The atrium was mildly dilated.  ------------------------------------------------------------------- Atrial septum:  No definite secundum ASD or patent foramen ovale was identified. Limited images of agitated saline study do not indicate right to left shunting.  ------------------------------------------------------------------- Right ventricle:  The cavity size was normal. Systolic function was normal.  ------------------------------------------------------------------- Pulmonic valve:   Poorly visualized.  Doppler:  There was trivial regurgitation.  ------------------------------------------------------------------- Tricuspid valve:   The valve appears to be grossly normal. Doppler:  There was physiologic regurgitation.  ------------------------------------------------------------------- Pulmonary artery:    Systolic pressure could not be accurately estimated.  ------------------------------------------------------------------- Right atrium:  The atrium was at the upper limits of normal in size.  ------------------------------------------------------------------- Pericardium:  There was no pericardial effusion.  ------------------------------------------------------------------- Systemic veins: Inferior vena cava: The vessel was normal in size. The respirophasic diameter changes were in the normal range (>=  50%), consistent with normal central venous pressure.  ------------------------------------------------------------------- Measurements  Left ventricle                         Value        Reference LV ID, ED, PLAX chordal                49.7  mm     43 - 52 LV ID, ES, PLAX chordal                28.9  mm     23 - 38 LV fx shortening, PLAX chordal         42    %      >=29 LV PW thickness, ED                    12.2  mm     --------- IVS/LV PW ratio, ED                    1.14         <=1.3 LV e&', lateral                         8.49  cm/s   --------- LV E/e&', lateral                       5.89         --------- LV e&', medial                          4.9   cm/s   --------- LV E/e&', medial                        10.2         --------- LV e&', average                         6.7   cm/s   --------- LV E/e&', average                       7.47         ---------  Ventricular septum  Value        Reference IVS thickness, ED                      13.9  mm     ---------  LVOT                                   Value        Reference LVOT ID, S                             26    mm     --------- LVOT area                              5.31  cm^2   ---------  Aorta                                  Value        Reference Aortic root ID, ED                     43    mm     ---------  Left atrium                            Value        Reference LA ID, A-P, ES                         43    mm     --------- LA ID/bsa, A-P                         2.12  cm/m^2 <=2.2 LA volume, S                           67.6  ml     --------- LA volume/bsa, S                       33.3  ml/m^2 --------- LA volume, ES, 1-p A4C                 51.2  ml     --------- LA volume/bsa, ES, 1-p A4C             25.2  ml/m^2 --------- LA volume, ES, 1-p A2C                 77.9  ml     --------- LA volume/bsa, ES, 1-p A2C             38.4  ml/m^2 ---------  Mitral valve                            Value        Reference Mitral E-wave peak velocity            50    cm/s   --------- Mitral A-wave peak velocity            64.5  cm/s   --------- Mitral  deceleration time       (H)     264   ms     150 - 230 Mitral E/A ratio, peak                 0.8          ---------  Systemic veins                         Value        Reference Estimated CVP                          3     mm Hg  ---------  Right ventricle                        Value        Reference TAPSE                                  21.9  mm     --------- RV s&', lateral, S                      13.8  cm/s   ---------  Legend: (L)  and  (H)  mark values outside specified reference range.  ------------------------------------------------------------------- Prepared and Electronically Authenticated by  Rozann Lesches, M.D. 2017-03-12T11:10:22             Risk Assessment/Calculations:   {Does this patient have ATRIAL FIBRILLATION?:[phone]} No BP recorded.  {Refresh Note OR Click here to enter BP  :1}***        Physical Exam:   VS:  There were no vitals taken for this visit.   Wt Readings from Last 3 Encounters:  12/26/21 239 lb 9.6 oz (108.7 kg)  11/13/21 238 lb 1.6 oz (108 kg)  05/08/21 238 lb (108 kg)     GEN: Well nourished, well developed in no acute distress NECK: No JVD; No carotid bruits CARDIAC: ***RRR, no murmurs, rubs, gallops RESPIRATORY:  Clear to auscultation without rales, wheezing or rhonchi  ABDOMEN: Soft, non-tender, non-distended EXTREMITIES:  No edema; No deformity   ASSESSMENT AND PLAN:    #Chronic Diastolic HF: -TTE 0000000 with LVEF 70-75%, G1DD, no significant valve disease -Suspect primary driver of LE edema secondary to underlying cirrhosis -Will check TTE to reassess EF -Continue lasix 40mg  daily -Continue spironolactone 25mg  daily -? Start jardiance 10mg  daily  #Alcoholic Cirrhosis with Esophageal Varices: -Follows with hepatology -Continue lasix 40mg  daily -Continue  spironolactone 25mg  daily  #HTN: -Continue hydralazine 50mg  TID -Continue spiro 25mg  daily -Continue amlodipine 10mg  daily     {Are you ordering a CV Procedure (e.g. stress test, cath, DCCV, TEE, etc)?   Press F2        :[id number]   Signed, Freada Bergeron, MD

## 2022-07-31 ENCOUNTER — Encounter (HOSPITAL_COMMUNITY): Payer: Self-pay

## 2022-07-31 ENCOUNTER — Emergency Department (HOSPITAL_COMMUNITY): Payer: Medicare (Managed Care)

## 2022-07-31 ENCOUNTER — Other Ambulatory Visit: Payer: Self-pay

## 2022-07-31 ENCOUNTER — Observation Stay (HOSPITAL_COMMUNITY)
Admission: EM | Admit: 2022-07-31 | Discharge: 2022-08-01 | Disposition: A | Discharge status: Home / Self Care | Payer: Medicare (Managed Care) | Attending: Internal Medicine | Admitting: Internal Medicine

## 2022-07-31 DIAGNOSIS — I5033 Acute on chronic diastolic (congestive) heart failure: Secondary | ICD-10-CM | POA: Diagnosis not present

## 2022-07-31 DIAGNOSIS — J45909 Unspecified asthma, uncomplicated: Secondary | ICD-10-CM | POA: Diagnosis not present

## 2022-07-31 DIAGNOSIS — N183 Chronic kidney disease, stage 3 unspecified: Secondary | ICD-10-CM | POA: Diagnosis not present

## 2022-07-31 DIAGNOSIS — I130 Hypertensive heart and chronic kidney disease with heart failure and stage 1 through stage 4 chronic kidney disease, or unspecified chronic kidney disease: Secondary | ICD-10-CM | POA: Insufficient documentation

## 2022-07-31 DIAGNOSIS — Z85528 Personal history of other malignant neoplasm of kidney: Secondary | ICD-10-CM | POA: Diagnosis not present

## 2022-07-31 DIAGNOSIS — Z79899 Other long term (current) drug therapy: Secondary | ICD-10-CM | POA: Insufficient documentation

## 2022-07-31 DIAGNOSIS — Z8673 Personal history of transient ischemic attack (TIA), and cerebral infarction without residual deficits: Secondary | ICD-10-CM | POA: Insufficient documentation

## 2022-07-31 DIAGNOSIS — M79606 Pain in leg, unspecified: Secondary | ICD-10-CM | POA: Diagnosis present

## 2022-07-31 DIAGNOSIS — Z87891 Personal history of nicotine dependence: Secondary | ICD-10-CM | POA: Diagnosis not present

## 2022-07-31 DIAGNOSIS — I509 Heart failure, unspecified: Secondary | ICD-10-CM

## 2022-07-31 LAB — CBC WITH DIFFERENTIAL/PLATELET
Abs Immature Granulocytes: 0.02 10*3/uL (ref 0.00–0.07)
Basophils Absolute: 0 10*3/uL (ref 0.0–0.1)
Basophils Relative: 1 %
Eosinophils Absolute: 0.1 10*3/uL (ref 0.0–0.5)
Eosinophils Relative: 1 %
HCT: 28.9 % — ABNORMAL LOW (ref 39.0–52.0)
Hemoglobin: 9.4 g/dL — ABNORMAL LOW (ref 13.0–17.0)
Immature Granulocytes: 0 %
Lymphocytes Relative: 9 %
Lymphs Abs: 0.5 10*3/uL — ABNORMAL LOW (ref 0.7–4.0)
MCH: 24 pg — ABNORMAL LOW (ref 26.0–34.0)
MCHC: 32.5 g/dL (ref 30.0–36.0)
MCV: 73.9 fL — ABNORMAL LOW (ref 80.0–100.0)
Monocytes Absolute: 0.6 10*3/uL (ref 0.1–1.0)
Monocytes Relative: 12 %
Neutro Abs: 4.2 10*3/uL (ref 1.7–7.7)
Neutrophils Relative %: 77 %
Platelets: 120 10*3/uL — ABNORMAL LOW (ref 150–400)
RBC: 3.91 MIL/uL — ABNORMAL LOW (ref 4.22–5.81)
RDW: 18.7 % — ABNORMAL HIGH (ref 11.5–15.5)
WBC: 5.5 10*3/uL (ref 4.0–10.5)
nRBC: 0 % (ref 0.0–0.2)

## 2022-07-31 LAB — BASIC METABOLIC PANEL
Anion gap: 11 (ref 5–15)
BUN: 21 mg/dL (ref 8–23)
CO2: 26 mmol/L (ref 22–32)
Calcium: 9.4 mg/dL (ref 8.9–10.3)
Chloride: 97 mmol/L — ABNORMAL LOW (ref 98–111)
Creatinine, Ser: 2.17 mg/dL — ABNORMAL HIGH (ref 0.61–1.24)
GFR, Estimated: 33 mL/min — ABNORMAL LOW (ref >60)
Glucose, Bld: 87 mg/dL (ref 70–99)
Potassium: 3.6 mmol/L (ref 3.5–5.1)
Sodium: 134 mmol/L — ABNORMAL LOW (ref 135–145)

## 2022-07-31 LAB — TROPONIN I (HIGH SENSITIVITY)
Troponin I (High Sensitivity): 12 ng/L (ref <18)
Troponin I (High Sensitivity): 12 ng/L (ref <18)

## 2022-07-31 LAB — BRAIN NATRIURETIC PEPTIDE: B Natriuretic Peptide: 514.3 pg/mL — ABNORMAL HIGH (ref 0.0–100.0)

## 2022-07-31 MED ORDER — SODIUM CHLORIDE 0.9% FLUSH: 3.0000 mL | INTRAVENOUS | Status: DC | PRN

## 2022-07-31 MED ORDER — SODIUM CHLORIDE 0.9 % IV SOLN: 250.0000 mL | INTRAVENOUS | Status: DC | PRN

## 2022-07-31 MED ORDER — ACETAMINOPHEN 325 MG PO TABS
650.0000 mg | ORAL_TABLET | ORAL | Status: DC | PRN
  Administered 2022-08-01 (×2): 650 mg via ORAL
  Filled 2022-07-31 (×2): qty 2

## 2022-07-31 MED ORDER — OXYCODONE HCL 5 MG PO TABS
5.0000 mg | ORAL_TABLET | ORAL | Status: AC | PRN
  Administered 2022-08-01: 5 mg via ORAL
  Filled 2022-07-31: qty 1

## 2022-07-31 MED ORDER — ENOXAPARIN SODIUM 40 MG/0.4ML IJ SOSY
40.0000 mg | PREFILLED_SYRINGE | INTRAMUSCULAR | Status: DC
  Administered 2022-07-31: 40 mg via SUBCUTANEOUS
  Filled 2022-07-31: qty 0.4

## 2022-07-31 MED ORDER — FUROSEMIDE 10 MG/ML IJ SOLN
60.0000 mg | Freq: Two times a day (BID) | INTRAMUSCULAR | Status: DC
  Administered 2022-07-31 – 2022-08-01 (×2): 60 mg via INTRAVENOUS
  Filled 2022-07-31 (×3): qty 6

## 2022-07-31 MED ORDER — ONDANSETRON HCL 4 MG/2ML IJ SOLN: 4.0000 mg | Freq: Four times a day (QID) | INTRAMUSCULAR | Status: DC | PRN

## 2022-07-31 MED ORDER — CARVEDILOL 3.125 MG PO TABS
3.1250 mg | ORAL_TABLET | Freq: Two times a day (BID) | ORAL | Status: DC
  Administered 2022-07-31 – 2022-08-01 (×2): 3.125 mg via ORAL
  Filled 2022-07-31 (×2): qty 1

## 2022-07-31 MED ORDER — SODIUM CHLORIDE 0.9% FLUSH
3.0000 mL | Freq: Two times a day (BID) | INTRAVENOUS | Status: DC
  Administered 2022-07-31 – 2022-08-01 (×3): 3 mL via INTRAVENOUS

## 2022-07-31 MED ORDER — POTASSIUM CHLORIDE CRYS ER 20 MEQ PO TBCR
40.0000 meq | EXTENDED_RELEASE_TABLET | Freq: Once | ORAL | Status: AC
  Administered 2022-07-31: 40 meq via ORAL
  Filled 2022-07-31: qty 2

## 2022-07-31 MED ORDER — FUROSEMIDE 10 MG/ML IJ SOLN
40.0000 mg | Freq: Once | INTRAMUSCULAR | Status: AC
Start: 2022-07-31 — End: 2022-07-31
  Administered 2022-07-31: 40 mg via INTRAVENOUS
  Filled 2022-07-31: qty 4

## 2022-07-31 NOTE — ED Provider Notes (Signed)
[place] EMERGENCY DEPARTMENT AT [facility] Provider Note   CSN: [id number] Arrival date & time: 07/31/22  1108     History  Chief Complaint  Patient presents with   Leg Pain    John [name] is a 67 y.o. male.  67 year old male with prior history as detailed below presents for evaluation.  Patient reports gradually worsening lower extremity edema.  Patient reports 2 to 3 weeks of worsening lower extremity edema.  Patient reports approximately 10 pounds of weight gain over the last 2 weeks.  He also admits intermittent chest discomfort.  He reports shortness of breath most appreciated with exertion.  He reports full compliance with his previously prescribed diuretic and Lasix.  Denies fever.  Denies chills.  He is comfortable at rest.  The history is provided by the patient and medical records.       Home Medications Prior to Admission medications   Medication Sig Start Date End Date Taking? Authorizing Provider  allopurinol (ZYLOPRIM) 100 MG tablet Take 1 tablet (100 mg total) by mouth daily. 05/12/18   Eugenie Filler, MD  amLODipine (NORVASC) 10 MG tablet Take 1 tablet (10 mg total) by mouth daily. 05/25/15   Debbe Odea, MD  atorvastatin (LIPITOR) 40 MG tablet Take 40 mg by mouth daily. 04/29/19   [provider]  colchicine 0.6 MG tablet Take 1 tablet (0.6 mg total) by mouth daily. 05/07/18   Eugenie Filler, MD  doxycycline (VIBRA-TABS) 100 MG tablet Take 1 tablet (100 mg total) by mouth 2 (two) times daily. 12/20/20   Edrick Kins, DPM  FLOVENT HFA 220 MCG/ACT inhaler Inhale 2 puffs into the lungs in the morning and at bedtime. 01/27/19   [provider]  folic acid (FOLVITE) 1 MG tablet Take 1 tablet (1 mg total) by mouth daily. 05/07/18   Eugenie Filler, MD  furosemide (LASIX) 40 MG tablet Take 40 mg by mouth daily.    [provider]  hydrALAZINE (APRESOLINE) 50 MG tablet Take 1 tablet (50 mg total) by mouth 3  (three) times daily. Patient not taking: Reported on 12/26/2021 05/25/15   Debbe Odea, MD  ketoconazole (NIZORAL) 2 % shampoo Apply 1 application topically See admin instructions. Prescribed 06/26/20: Apply topically in shower once every 3 days as needed for itching for 8 weeks. 2 X WEEK    [provider]  lactulose (CHRONULAC) 10 GM/15ML solution Take 20 g by mouth every other day. Patient not taking: Reported on 12/26/2021 11/01/16   [provider]  omeprazole (PRILOSEC) 40 MG capsule TAKE ONE CAPSULE BY MOUTH DAILY Patient taking differently: Take 40 mg by mouth daily as needed (acid reflux). 07/02/18   Ladene Artist, MD  oxyCODONE-acetaminophen (PERCOCET) 5-325 MG tablet Take 1 tablet by mouth every 6 (six) hours as needed for severe pain. 12/20/20   Edrick Kins, DPM  PROAIR HFA 108 [phone] Base) MCG/ACT inhaler Inhale 2 puffs into the lungs every 6 (six) hours as needed for wheezing or shortness of breath. 10/02/17   [provider]  spironolactone (ALDACTONE) 25 MG tablet Take 25 mg by mouth daily as needed (swelling). 06/13/18   [provider]      Allergies    Penicillins    Review of Systems   Review of Systems  All other systems reviewed and are negative.   Physical Exam Updated Vital Signs BP 114/82 (BP Location: Left Arm)   Pulse 87   Temp 97.8 F (  36.6 C) (Oral)   Resp 18   SpO2 94%  Physical Exam Vitals and nursing note reviewed.  Constitutional:      General: He is not in acute distress.    Appearance: Normal appearance. He is well-developed.  HENT:     Head: Normocephalic and atraumatic.  Eyes:     Conjunctiva/sclera: Conjunctivae normal.     Pupils: Pupils are equal, round, and reactive to light.  Cardiovascular:     Rate and Rhythm: Normal rate and regular rhythm.     Heart sounds: Normal heart sounds.  Pulmonary:     Effort: Pulmonary effort is normal. No respiratory distress.     Breath sounds: Normal breath sounds.   Abdominal:     General: There is no distension.     Palpations: Abdomen is soft.     Tenderness: There is no abdominal tenderness.  Musculoskeletal:        General: No deformity. Normal range of motion.     Cervical back: Normal range of motion and neck supple.     Right lower leg: Edema present.     Left lower leg: Edema present.  Skin:    General: Skin is warm and dry.  Neurological:     General: No focal deficit present.     Mental Status: He is alert and oriented to person, place, and time. Mental status is at baseline.     ED Results / Procedures / Treatments   Labs (all labs ordered are listed, but only abnormal results are displayed) Labs Reviewed  CBC WITH DIFFERENTIAL/PLATELET - Abnormal; Notable for the following components:      Result Value   RBC 3.91 (*)    Hemoglobin 9.4 (*)    HCT 28.9 (*)    MCV 73.9 (*)    MCH 24.0 (*)    RDW 18.7 (*)    Platelets 120 (*)    Lymphs Abs 0.5 (*)    All other components within normal limits  BASIC METABOLIC PANEL - Abnormal; Notable for the following components:   Sodium 134 (*)    Chloride 97 (*)    Creatinine, Ser 2.17 (*)    GFR, Estimated 33 (*)    All other components within normal limits  BRAIN NATRIURETIC PEPTIDE - Abnormal; Notable for the following components:   B Natriuretic Peptide 514.3 (*)    All other components within normal limits  TROPONIN I (HIGH SENSITIVITY)  TROPONIN I (HIGH SENSITIVITY)    EKG None  Radiology DG Chest 1 View  Result Date: 07/31/2022 CLINICAL DATA:  Shortness of breath, edema in the lower extremities EXAM: CHEST  1 VIEW COMPARISON:  11/13/2021 FINDINGS: Transverse diameter of heart is increased. There is slight prominence of central pulmonary vessels. There are no signs of alveolar pulmonary edema. There is increase in markings in right lower lung field. There is minimal blunting of lateral CP angles. There is no pneumothorax. Metallic coil is seen in the epigastrium, possibly  resolved from previous vascular intervention. IMPRESSION: Cardiomegaly. Central pulmonary vessels are slightly prominent without signs of alveolar pulmonary edema. Increased markings are seen in right lower lung field suggesting mild asymmetric interstitial pulmonary edema or interstitial pneumonia. Lateral CP angles are indistinct suggesting possible pleural thickening or minimal effusions. Electronically Signed   By: Elmer Picker M.D.   On: 07/31/2022 11:59    Procedures Procedures    Medications Ordered in ED Medications - No data to display  ED Course/ Medical Decision Making/ A&P  Medical Decision Making   Medical Screen Complete  This patient presented to the ED with complaint of edema, shortness of breath.  This complaint involves an extensive number of treatment options. The initial differential diagnosis includes, but is not limited to, CHF exacerbation, metabolic abnormality, etc.  This presentation is: Acute, Chronic, Self-Limited, Previously Undiagnosed, Uncertain Prognosis, Complicated, Systemic Symptoms, and Threat to Life/Bodily Function  Patient is presenting with complaint of increased edema to his lower extremities, increased shortness of breath with exertion, intermittent chest discomfort.  Presentation is concerning for likely CHF exacerbation.  Patient is reporting full compliance with previously prescribed diuretic.    Patient would benefit from admission for further workup and treatment.  Labs obtained are remarkable for troponin of 12, BNP of 514, creatinine of 2.1.  Lasix IV administered here in the ED.  Hospitalist service made aware of case and will evaluate for admission.  Additional history obtained:  External records from outside sources obtained and reviewed including prior ED visits and prior Inpatient records.    Lab Tests:  I ordered and personally interpreted labs.  The pertinent results include: CBC, BMP,  BNP, troponin   Imaging Studies ordered:  I ordered imaging studies including plain films of chest I independently visualized and interpreted obtained imaging which showed concerning for pulmonary edema I agree with the radiologist interpretation.   Cardiac Monitoring:  The patient was maintained on a cardiac monitor.  I personally viewed and interpreted the cardiac monitor which showed an underlying rhythm of: NSR   Medicines ordered:  I ordered medication including Lasix  for CHF  Reevaluation of the patient after these medicines showed that the patient: stayed the same    Problem List / ED Course:  Edema, DOE, CHF exacerbation   Reevaluation:  After the interventions noted above, I reevaluated the patient and found that they have: improved   Disposition:  After consideration of the diagnostic results and the patients response to treatment, I feel that the patent would benefit from admission.          Final Clinical Impression(s) / ED Diagnoses Final diagnoses:  Acute on chronic congestive heart failure, unspecified heart failure type    Rx / DC Orders ED Discharge Orders     None         Valarie Merino, MD 07/31/22 [phone]

## 2022-07-31 NOTE — ED Notes (Signed)
ED TO INPATIENT HANDOFF REPORT  Name/Age/Gender John [name] 67 y.o. male  Code Status    Code Status Orders  (From admission, onward)           Start     Ordered   07/31/22 1533  Full code  Continuous       Question:  By:  Answer:  Consent: discussion documented in EHR   07/31/22 1536           Code Status History     Date Active Date Inactive Code Status Order ID Comments User Context   05/03/2018 2103 05/06/2018 2028 Full Code [id number]  Colbert Ewing, MD ED   07/19/2015 0624 07/21/2015 1356 Full Code [id number]  Rise Patience, MD ED   07/07/2015 0632 07/10/2015 1553 Full Code [id number]  Ivor Costa, MD ED   05/23/2015 0443 05/25/2015 1302 Full Code [id number]  Rise Patience, MD ED   10/31/2014 2319 11/08/2014 1610 Full Code [id number]  Dellia Nims, MD Inpatient   08/24/2014 1717 09/03/2014 0024 Full Code [id number]  Cherene Altes, MD Inpatient   08/16/2014 1909 08/24/2014 1717 Full Code [id number]  Sandi Mariscal, MD Inpatient   08/15/2014 2121 08/16/2014 1909 Full Code [id number]  Greggory Keen, MD Inpatient   08/14/2014 1652 08/15/2014 2121 Full Code [id number]  Kelvin Cellar, MD Inpatient   07/27/2014 0014 07/28/2014 2034 Full Code [id number]  Lavina Hamman, MD ED   06/24/2014 0653 06/29/2014 1736 Full Code [id number]  Rise Patience, MD Inpatient       Home/SNF/Other Nursing Home  Chief Complaint Acute exacerbation of congestive heart failure [I50.9]  Level of Care/Admitting Diagnosis ED Disposition     ED Disposition  Admit   Condition  --   [place] [facility] Area: [facility] [[id number]]  Level of Care: Med-Surg [16]  May place patient in observation at [facility] or [place] if equivalent level of care is available:: Yes  Covid Evaluation: Confirmed COVID Negative  Diagnosis: Acute exacerbation of congestive heart failure [id number]  Admitting Physician: Little Ishikawa [id number]  Attending Physician: Little Ishikawa [id number]          Medical History Past Medical History:  Diagnosis Date   Allergy    Arthritis    oa back   Ascites    resolved since 2018   Asthma    Cataract    both eyes   CHF (congestive heart failure) 2001   sees primary for   Chronic back pain    Chronic kidney disease    ckd 3   Cirrhosis resolved since 2018   Coronary artery disease    nonobstrucrtibe   Dyspnea    with heavy exertion   ETOH abuse    Gastric varices    GERD (gastroesophageal reflux disease)    Gout    Hepatitis    hepatitic c 2018 24 week tx with ecuplipsa, no hepatitic c detected after tx   History of blood transfusion 8-9- yrs ago   Hypertension    Optic neuropathy, left    PAD (peripheral artery disease)    slight  primary manages   Polysubstance abuse    Renal cell carcinoma 2016, 2018 and 2022   left ablation   Seizures    age 34 none since   Sickle cell trait    Stroke 1998   "light no problems with"   Uses walker 12/04/2020   Wears dentures    Wears glasses  Allergies Allergies  Allergen Reactions   Penicillins Other (See Comments)    Convulsions, Patient could not walk, childhood allergy   Has patient had a PCN reaction causing immediate rash, facial/tongue/throat swelling, SOB or lightheadedness with hypotension: No Has patient had a PCN reaction causing severe rash involving mucus membranes or skin necrosis: No Has patient had a PCN reaction that required hospitalization No Has patient had a PCN reaction occurring within the last 10 years: No If all of the above answers are "NO", then may proceed with Cephalosporin use.    IV Location/Drains/Wounds Patient Lines/Drains/Airways Status     Active Line/Drains/Airways     Name Placement date Placement time Site Days   Peripheral IV 07/31/22 20 G 1" Anterior;Distal;Right;Upper Arm 07/31/22  1425  Arm  less than 1   Incision (Closed) 11/08/16 Flank Left 11/08/16  1130  -- 2091   Incision (Closed)  07/28/20 Foot Left 07/28/20  1525  -- 733   Incision (Closed) 12/07/20 Foot 12/07/20  1216  -- 601            Labs/Imaging Results for orders placed or performed during the hospital encounter of 07/31/22 (from the past 48 hour(s))  CBC with Differential     Status: Abnormal   Collection Time: 07/31/22 11:54 AM  Result Value Ref Range   WBC 5.5 4.0 - 10.5 K/uL   RBC 3.91 (L) 4.22 - 5.81 MIL/uL   Hemoglobin 9.4 (L) 13.0 - 17.0 g/dL   HCT 28.9 (L) 39.0 - 52.0 %   MCV 73.9 (L) 80.0 - 100.0 fL   MCH 24.0 (L) 26.0 - 34.0 pg   MCHC 32.5 30.0 - 36.0 g/dL   RDW 18.7 (H) 11.5 - 15.5 %   Platelets 120 (L) 150 - 400 K/uL   nRBC 0.0 0.0 - 0.2 %   Neutrophils Relative % 77 %   Neutro Abs 4.2 1.7 - 7.7 K/uL   Lymphocytes Relative 9 %   Lymphs Abs 0.5 (L) 0.7 - 4.0 K/uL   Monocytes Relative 12 %   Monocytes Absolute 0.6 0.1 - 1.0 K/uL   Eosinophils Relative 1 %   Eosinophils Absolute 0.1 0.0 - 0.5 K/uL   Basophils Relative 1 %   Basophils Absolute 0.0 0.0 - 0.1 K/uL   Immature Granulocytes 0 %   Abs Immature Granulocytes 0.02 0.00 - 0.07 K/uL    Comment: Performed at [facility], [place] [address] [place], [place] [id number]  Basic metabolic panel     Status: Abnormal   Collection Time: 07/31/22 11:54 AM  Result Value Ref Range   Sodium 134 (L) 135 - 145 mmol/L   Potassium 3.6 3.5 - 5.1 mmol/L   Chloride 97 (L) 98 - 111 mmol/L   CO2 26 22 - 32 mmol/L   Glucose, Bld 87 70 - 99 mg/dL    Comment: Glucose reference range applies only to samples taken after fasting for at least 8 hours.   BUN 21 8 - 23 mg/dL   Creatinine, Ser 2.17 (H) 0.61 - 1.24 mg/dL   Calcium 9.4 8.9 - 10.3 mg/dL   GFR, Estimated 33 (L) >60 mL/min    Comment: (NOTE) Calculated using the CKD-EPI Creatinine Equation (2021)    Anion gap 11 5 - 15    Comment: Performed at [facility], [place] [address] [place], [place] [address]  Troponin I (High Sensitivity)     Status: None    Collection Time: 07/31/22 11:54 AM  Result Value Ref Range   Troponin I (High Sensitivity) 12 <18 ng/L    Comment: (NOTE) Elevated high sensitivity troponin I (hsTnI) values and significant  changes across serial measurements may suggest ACS but many other  chronic and acute conditions are known to elevate hsTnI results.  Refer to the "Links" section for chest pain algorithms and additional  guidance. Performed at [facility], [place] [address], [place], [place] [address]   Brain natriuretic peptide     Status: Abnormal   Collection Time: 07/31/22 11:55 AM  Result Value Ref Range   B Natriuretic Peptide 514.3 (H) 0.0 - 100.0 pg/mL    Comment: Performed at [facility], [place] [address], [place], [place] [address]  Troponin I (High Sensitivity)     Status: None   Collection Time: 07/31/22  2:28 PM  Result Value Ref Range   Troponin I (High Sensitivity) 12 <18 ng/L    Comment: (NOTE) Elevated high sensitivity troponin I (hsTnI) values and significant  changes across serial measurements may suggest ACS but many other  chronic and acute conditions are known to elevate hsTnI results.  Refer to the "Links" section for chest pain algorithms and additional  guidance. Performed at [facility], [place] [address] [place], [place] [address]    DG Chest 1 View  Result Date: 07/31/2022 CLINICAL DATA:  Shortness of breath, edema in the lower extremities EXAM: CHEST  1 VIEW COMPARISON:  11/13/2021 FINDINGS: Transverse diameter of heart is increased. There is slight prominence of central pulmonary vessels. There are no signs of alveolar pulmonary edema. There is increase in markings in right lower lung field. There is minimal blunting of lateral CP angles. There is no pneumothorax. Metallic coil is seen in the epigastrium, possibly resolved from previous vascular intervention. IMPRESSION: Cardiomegaly. Central pulmonary vessels are slightly prominent  without signs of alveolar pulmonary edema. Increased markings are seen in right lower lung field suggesting mild asymmetric interstitial pulmonary edema or interstitial pneumonia. Lateral CP angles are indistinct suggesting possible pleural thickening or minimal effusions. Electronically Signed   By: Elmer Picker M.D.   On: 07/31/2022 11:59    Pending Labs Unresulted Labs (From admission, onward)     Start     Ordered   08/07/22 0500  Creatinine, serum  (enoxaparin (LOVENOX)    CrCl >/= 30 ml/min)  Weekly,   R     Comments: while on enoxaparin therapy    07/31/22 1536   08/01/22 0500  Comprehensive metabolic panel  Daily,   R      07/31/22 1536   08/01/22 0500  CBC  Daily,   R      07/31/22 1536   07/31/22 1532  HIV Antibody (routine testing w rflx)  (HIV Antibody (Routine testing w reflex) panel)  Once,   R        07/31/22 1536   07/31/22 1532  CBC  (enoxaparin (LOVENOX)    CrCl >/= 30 ml/min)  Once,   R       Comments: Baseline for enoxaparin therapy IF NOT ALREADY DRAWN.  Notify MD if PLT < 100 K.    07/31/22 1536   07/31/22 1532  Creatinine, serum  (enoxaparin (LOVENOX)    CrCl >/= 30 ml/min)  Once,   R       Comments: Baseline for enoxaparin therapy IF NOT ALREADY DRAWN.    07/31/22 1536            Vitals/Pain  Today's Vitals   07/31/22 1415 07/31/22 1430 07/31/22 1445 07/31/22 1503  BP: 123/82 124/72 (!) 132/100   Pulse: 89 75 97   Resp: (!) 24 17 (!) 22   Temp:    97.8 F (36.6 C)  TempSrc:    Oral  SpO2: 93% 95% 100%   PainSc:        Isolation Precautions No active isolations  Medications Medications  sodium chloride flush (NS) 0.9 % injection 3 mL (has no administration in time range)  sodium chloride flush (NS) 0.9 % injection 3 mL (has no administration in time range)  0.9 %  sodium chloride infusion (has no administration in time range)  acetaminophen (TYLENOL) tablet 650 mg (has no administration in time range)  ondansetron (ZOFRAN) injection 4  mg (has no administration in time range)  enoxaparin (LOVENOX) injection 40 mg (has no administration in time range)  furosemide (LASIX) injection 60 mg (has no administration in time range)  potassium chloride SA (KLOR-CON M) CR tablet 40 mEq (has no administration in time range)  carvedilol (COREG) tablet 3.125 mg (has no administration in time range)  furosemide (LASIX) injection 40 mg (40 mg Intravenous Given 07/31/22 1425)    Mobility walks with device

## 2022-07-31 NOTE — ED Notes (Signed)
Ambulated pt the best I could with a two person assist. He stated that he normally does not ambulate well and when he does it is with a device assistance. Pts initial sat was 97% and dropped to 93% when walking to the door of his room and back to the bed. Pt states that his legs are tight and that it hurts to walk at this time.

## 2022-07-31 NOTE — ED Notes (Signed)
[facility] called about a update , Brandt Loosen [phone]. She has Sales executive.

## 2022-07-31 NOTE — ED Triage Notes (Signed)
Pt presents with c/o leg pain. Pt has been experiencing fluid buildup in his legs as well as his abdomen. Pt has a hx of CHF. Pt is alert and oriented per EMS.

## 2022-07-31 NOTE — H&P (Signed)
History and Physical    JIMM SALK [id number] DOB: Apr 15, 1956 DOA: 07/31/2022  PCP: Merlene Laughter, MD (Inactive)   Chief Complaint: Leg pain  HPI: John [name] is a 67 y.o. male with medical history significant of diastolic dysfunction heart failure, CVA, cirrhosis, alcohol abuse, hypertension recurrent episodes of ascites and edema and ambulatory dysfunction who presents to the ED with approximately 2 to 3 weeks of worsening leg pain ambulatory dysfunction and edema.  Patient reports being moderately noncompliant with medications and follow-up, he cannot recall any of his home medications, is unclear who he follows up with other than "at [facility]" and reports that despite having leg pain and swelling for 3 weeks he has not contacted his primary care team.  He denies any chest pain nausea vomiting diarrhea constipation headache fevers chills.  ED Course: In the ED patient was evaluated noted to have elevated BNP, chest x-ray shows mild diffuse haziness concerning for pulmonary edema but without hypoxia.  Patient received Lasix in the ED with remarkable urine output but remains symptomatic with ambulation as well as with ongoing edema and leg pain.  Hospitalist called for admission.  Review of Systems: As per HPI.   Assessment/Plan Principal Problem:   Acute exacerbation of congestive heart failure   Acute recurrent exacerbation of diastolic heart failure, most recent EF 70 to 75% -Patient admitted under heart failure order set, heart failure team notified -hopefully patient can follow-up with heart failure team through our facility for improved outpatient care and management -Continue IV Lasix twice daily, follow I's and O's -Will likely need to adjust and reevaluate patient's home medication regimen, unclear if he is on ACE/ARB/Entresto or beta-blocker per core measures. -Have initiated low-dose carvedilol but held off on ACE/ARB/Entresto given previous diastolic  dysfunction and elevated AKI although patient may have worsening creatinine chronically in the setting of uncontrolled heart failure, unspecified -Repeat echo pending to evaluate given no recent echo in our system since 2017 during another hospitalization due to heart failure exacerbation  AKI versus questionable new onset CKD  -Patient labs sparse through our facility -Previous creatinine appears to be around 1.6-2 however unclear if this is his baseline or during his exacerbations -Current creatinine 2.17 which is higher than his labs over the past 2 years, presumed AKI -Follow creatinine with diuresis, if creatinine continues to trend upwards despite diuretics patient may require evaluation by nephrology in the setting of possible cardiorenal syndrome  Medication/follow-up noncompliance -Lengthy discussion at bedside with patient about need for medication compliance and follow-up compliance -He was seen by our cardiology outpatient staff last year but continued to defer office visits and ultimately was lost to follow-up.  He does [facility] with [facility] right now but indicates they "do not do anything for me" and he feels that he cannot call them when he has acute events such as his worsening edema over the past 3 weeks -Hoping our cardiology team will be able to follow-up with patient for closer monitoring and medication management  History of alcohol abuse and cirrhosis -Patient declines any ongoing drinking, CMP pending to evaluate baseline liver function  Hypertension -Initiated carvedilol low-dose in the setting of heart failure, follow home med rec and titrate appropriately -Blood pressure currently minimally elevated, will avoid aggressive blood pressure control to allow room for diuretics  History of CVA -Patient likely benefit from aspirin/Plavix/statin and aggressive blood pressure control, will follow home med rec  DVT prophylaxis: Lovenox Code Status: Full Family Communication:  None present Status  is: Observation  Dispo: The patient is from: Home              Anticipated d/c is to: Home              Anticipated d/c date is: 24 to 48 hours              Patient currently not medically stable for discharge given heart failure exacerbation, dyspnea with exertion, orthopnea and profound lower extremity edema  Consultants:  Heart failure team notified in hopes to secure follow-up for patient  Procedures:  None planned   Past Medical History:  Diagnosis Date   Allergy    Arthritis    oa back   Ascites    resolved since 2018   Asthma    Cataract    both eyes   CHF (congestive heart failure) 2001   sees primary for   Chronic back pain    Chronic kidney disease    ckd 3   Cirrhosis resolved since 2018   Coronary artery disease    nonobstrucrtibe   Dyspnea    with heavy exertion   ETOH abuse    Gastric varices    GERD (gastroesophageal reflux disease)    Gout    Hepatitis    hepatitic c 2018 24 week tx with ecuplipsa, no hepatitic c detected after tx   History of blood transfusion 8-9- yrs ago   Hypertension    Optic neuropathy, left    PAD (peripheral artery disease)    slight  primary manages   Polysubstance abuse    Renal cell carcinoma 2016, 2018 and 2022   left ablation   Seizures    age 11 none since   Sickle cell trait    Stroke 1998   "light no problems with"   Uses walker 12/04/2020   Wears dentures    Wears glasses     Past Surgical History:  Procedure Laterality Date   ARTHRODESIS METATARSALPHALANGEAL JOINT (MTPJ) Right 12/07/2020   Procedure: ARTHRODESIS METATARSALPHALANGEAL JOINT (MTPJ);  Surgeon: Edrick Kins, DPM;  Location: [facility];  Service: Podiatry;  Laterality: Right;   COLONOSCOPY  2021   ESOPHAGOGASTRODUODENOSCOPY N/A 08/14/2014   Procedure: ESOPHAGOGASTRODUODENOSCOPY (EGD);  Surgeon: Ladene Artist, MD;  Location: [facility];  Service: Endoscopy;  Laterality: N/A;    ESOPHAGOGASTRODUODENOSCOPY (EGD) WITH PROPOFOL N/A 06/03/2017   Procedure: ESOPHAGOGASTRODUODENOSCOPY (EGD) WITH PROPOFOL;  Surgeon: Ladene Artist, MD;  Location: [facility];  Service: Endoscopy;  Laterality: N/A;   HALLUX FUSION Left 07/28/2020   Procedure: HALLUX FUSION MPJ;  Surgeon: Edrick Kins, DPM;  Location: [facility];  Service: Podiatry;  Laterality: Left;   HAMMER TOE SURGERY Left 07/28/2020   Procedure: HAMMER TOE CORRECTION  2,3,AND4 LEFT FOOT;  Surgeon: Edrick Kins, DPM;  Location: [facility];  Service: Podiatry;  Laterality: Left;   HAMMER TOE SURGERY Right 12/07/2020   Procedure: HAMMER TOE CORRECTION  2-4;  Surgeon: Edrick Kins, DPM;  Location: [facility];  Service: Podiatry;  Laterality: Right;   IR RADIOLOGIST EVAL & MGMT  09/25/2016   IR RADIOLOGIST EVAL & MGMT  12/10/2016   IR RADIOLOGIST EVAL & MGMT  03/25/2018   IR RADIOLOGIST EVAL & MGMT  03/24/2019   IR RADIOLOGIST EVAL & MGMT  05/17/2020   IR RADIOLOGIST EVAL & MGMT  08/03/2020   IR RADIOLOGIST EVAL & MGMT  11/22/2020   IR RADIOLOGIST EVAL & MGMT  05/31/2021   METATARSAL HEAD EXCISION Left 07/28/2020   Procedure: METATARSAL HEAD EXCISION TOES 2,3,AND 4  LEFT FOOT;  Surgeon: Edrick Kins, DPM;  Location: [facility];  Service: Podiatry;  Laterality: Left;   METATARSAL OSTEOTOMY Right 12/07/2020   Procedure: METATARSAL OSTEOTOMY TOES 2-4 RIGHT FOOT;  Surgeon: Edrick Kins, DPM;  Location: [facility];  Service: Podiatry;  Laterality: Right;   none     RADIOFREQUENCY ABLATION Left 11/08/2016   Procedure: LEFT RENAL CRYOABLATION;  Surgeon: Greggory Keen, MD;  Location: [facility];  Service: Anesthesiology;  Laterality: Left;   RADIOLOGY WITH ANESTHESIA N/A 08/15/2014   Procedure: RADIOLOGY WITH ANESTHESIA;  Surgeon: Greggory Keen, MD;  Location: [facility];  Service: Radiology;  Laterality: N/A;   RADIOLOGY WITH ANESTHESIA Left 07/05/2020    Procedure: CT CRYOABLATION;  Surgeon: Greggory Keen, MD;  Location: [facility];  Service: Anesthesiology;  Laterality: Left;   UPPER GASTROINTESTINAL ENDOSCOPY       reports that he quit smoking about 11 years ago. His smoking use included cigarettes. He has a 6.00 pack-year smoking history. He has never used smokeless tobacco. He reports current drug use. Drugs: Cocaine and Marijuana. He reports that he does not drink alcohol.  Allergies  Allergen Reactions   Penicillins Other (See Comments)    Convulsions, Patient could not walk, childhood allergy   Has patient had a PCN reaction causing immediate rash, facial/tongue/throat swelling, SOB or lightheadedness with hypotension: No Has patient had a PCN reaction causing severe rash involving mucus membranes or skin necrosis: No Has patient had a PCN reaction that required hospitalization No Has patient had a PCN reaction occurring within the last 10 years: No If all of the above answers are "NO", then may proceed with Cephalosporin use.    Family History  Problem Relation Age of Onset   Hypertension Mother        Living   Kidney disease Mother    Diabetes Mother    Heart disease Mother    Hypertension Father        Deceased, 96   Ulcers Father    Stomach cancer Father    Hypertension Brother    Diabetes Brother    Kidney disease Brother    Hypertension Sister    Colon cancer Neg Hx    Esophageal cancer Neg Hx    Rectal cancer Neg Hx     Prior to Admission medications   Medication Sig Start Date End Date Taking? Authorizing Provider  allopurinol (ZYLOPRIM) 100 MG tablet Take 1 tablet (100 mg total) by mouth daily. 05/12/18   Eugenie Filler, MD  amLODipine (NORVASC) 10 MG tablet Take 1 tablet (10 mg total) by mouth daily. 05/25/15   Debbe Odea, MD  atorvastatin (LIPITOR) 40 MG tablet Take 40 mg by mouth daily. 04/29/19   [provider]  colchicine 0.6 MG tablet Take 1 tablet (0.6 mg total) by mouth daily. 05/07/18    Eugenie Filler, MD  doxycycline (VIBRA-TABS) 100 MG tablet Take 1 tablet (100 mg total) by mouth 2 (two) times daily. 12/20/20   Edrick Kins, DPM  FLOVENT HFA 220 MCG/ACT inhaler Inhale 2 puffs into the lungs in the morning and at bedtime. 01/27/19   [provider]  folic acid (FOLVITE) 1 MG tablet Take 1 tablet (1 mg total) by mouth daily. 05/07/18   Eugenie Filler, MD  furosemide (LASIX) 40 MG tablet Take 40 mg by mouth daily.  [provider]  hydrALAZINE (APRESOLINE) 50 MG tablet Take 1 tablet (50 mg total) by mouth 3 (three) times daily. 05/25/15   Debbe Odea, MD  ketoconazole (NIZORAL) 2 % shampoo Apply 1 application topically See admin instructions. Prescribed 06/26/20: Apply topically in shower once every 3 days as needed for itching for 8 weeks. 2 X WEEK    [provider]  lactulose (CHRONULAC) 10 GM/15ML solution Take 20 g by mouth every other day. 11/01/16   [provider]  omeprazole (PRILOSEC) 40 MG capsule TAKE ONE CAPSULE BY MOUTH DAILY Patient taking differently: Take 40 mg by mouth daily as needed (acid reflux). 07/02/18   Ladene Artist, MD  oxyCODONE-acetaminophen (PERCOCET) 5-325 MG tablet Take 1 tablet by mouth every 6 (six) hours as needed for severe pain. 12/20/20   Edrick Kins, DPM  PROAIR HFA [phone] Base) MCG/ACT inhaler Inhale 2 puffs into the lungs every 6 (six) hours as needed for wheezing or shortness of breath. 10/02/17   [provider]  spironolactone (ALDACTONE) 25 MG tablet Take 25 mg by mouth daily as needed (swelling). 06/13/18   [provider]    Physical Exam: Vitals:   07/31/22 1445 07/31/22 1503 07/31/22 1515 07/31/22 1545  BP: (!) 132/100     Pulse: 97  100 83  Resp: (!) 22  20 17   Temp:  97.8 F (36.6 C)    TempSrc:  Oral    SpO2: 100%  94% 94%    Constitutional: NAD, calm, comfortable Vitals:   07/31/22 1445 07/31/22 1503 07/31/22 1515 07/31/22 1545  BP: (!) 132/100     Pulse:  97  100 83  Resp: (!) 22  20 17   Temp:  97.8 F (36.6 C)    TempSrc:  Oral    SpO2: 100%  94% 94%   General:  Pleasantly resting in bed, No acute distress. HEENT:  Normocephalic atraumatic.  Sclerae nonicteric, noninjected.  Extraocular movements intact bilaterally. Neck: Notable JVD Lungs: Scant bibasilar rales Heart:  Regular rate and rhythm.  Without murmurs, rubs, or gallops. Abdomen:  Soft, nontender, nondistended.  Without guarding or rebound. Extremities: 2+ edema bilateral lower extremities proximal to the knees.  Labs on Admission: I have personally reviewed following labs and imaging studies  CBC: Recent Labs  Lab 07/31/22 1154  WBC 5.5  NEUTROABS 4.2  HGB 9.4*  HCT 28.9*  MCV 73.9*  PLT 123456*   Basic Metabolic Panel: Recent Labs  Lab 07/31/22 1154  NA 134*  K 3.6  CL 97*  CO2 26  GLUCOSE 87  BUN 21  CREATININE 2.17*  CALCIUM 9.4   GFR: CrCl cannot be calculated (Unknown ideal weight.). Liver Function Tests: No results for input(s): "AST", "ALT", "ALKPHOS", "BILITOT", "PROT", "ALBUMIN" in the last 168 hours. No results for input(s): "LIPASE", "AMYLASE" in the last 168 hours. No results for input(s): "AMMONIA" in the last 168 hours. Coagulation Profile: No results for input(s): "INR", "PROTIME" in the last 168 hours. Cardiac Enzymes: No results for input(s): "CKTOTAL", "CKMB", "CKMBINDEX", "TROPONINI" in the last 168 hours. BNP (last 3 results) No results for input(s): "PROBNP" in the last 8760 hours. HbA1C: No results for input(s): "HGBA1C" in the last 72 hours. CBG: No results for input(s): "GLUCAP" in the last 168 hours. Lipid Profile: No results for input(s): "CHOL", "HDL", "LDLCALC", "TRIG", "CHOLHDL", "LDLDIRECT" in the last 72 hours. Thyroid Function Tests: No results for input(s): "TSH", "T4TOTAL", "FREET4", "T3FREE", "THYROIDAB" in the last 72 hours. Anemia  Panel: No results for input(s): "VITAMINB12", "FOLATE", "FERRITIN", "TIBC",  "IRON", "RETICCTPCT" in the last 72 hours. Urine analysis:    Component Value Date/Time   COLORURINE YELLOW 05/03/2018 1628   APPEARANCEUR CLEAR 05/03/2018 1628   LABSPEC 1.012 05/03/2018 1628   PHURINE 5.0 05/03/2018 1628   GLUCOSEU NEGATIVE 05/03/2018 1628   HGBUR MODERATE (A) 05/03/2018 1628   BILIRUBINUR NEGATIVE 05/03/2018 1628   KETONESUR NEGATIVE 05/03/2018 1628   PROTEINUR NEGATIVE 05/03/2018 1628   UROBILINOGEN 1.0 10/31/2014 2127   NITRITE NEGATIVE 05/03/2018 1628   LEUKOCYTESUR NEGATIVE 05/03/2018 1628    Radiological Exams on Admission: DG Chest 1 View  Result Date: 07/31/2022 CLINICAL DATA:  Shortness of breath, edema in the lower extremities EXAM: CHEST  1 VIEW COMPARISON:  11/13/2021 FINDINGS: Transverse diameter of heart is increased. There is slight prominence of central pulmonary vessels. There are no signs of alveolar pulmonary edema. There is increase in markings in right lower lung field. There is minimal blunting of lateral CP angles. There is no pneumothorax. Metallic coil is seen in the epigastrium, possibly resolved from previous vascular intervention. IMPRESSION: Cardiomegaly. Central pulmonary vessels are slightly prominent without signs of alveolar pulmonary edema. Increased markings are seen in right lower lung field suggesting mild asymmetric interstitial pulmonary edema or interstitial pneumonia. Lateral CP angles are indistinct suggesting possible pleural thickening or minimal effusions. Electronically Signed   By: Elmer Picker M.D.   On: 07/31/2022 11:59    EKG: Independently reviewed.   Little Ishikawa DO [facility] For contact please use secure messenger on Epic  If 7PM-7AM, please contact night-coverage located on [email or web address]   07/31/2022, 4:14 PM

## 2022-07-31 NOTE — ED Notes (Signed)
Patient is sitting up on the side of the bed in no obvious distress. Pt states that he is most comfortable this way and is watching tv.

## 2022-07-31 NOTE — ED Provider Triage Note (Signed)
Emergency Medicine Provider Triage Evaluation Note  John [name] , a 67 y.o. male  was evaluated in triage.  Pt complains of flower extremity edema.  History of CHF.  Patient admits to chest pain and shortness of breath.  He has been compliant with his diuretic.  Review of Systems  Positive: Lower extremity edema Negative: fever  Physical Exam  BP 114/82 (BP Location: Left Arm)   Pulse 87   Temp 97.8 F (36.6 C) (Oral)   Resp 18   SpO2 94%  Gen:   Awake, no distress  Resp:  Normal effort  MSK:   Moves extremities without difficulty  Other:    Medical Decision Making  Medically screening exam initiated at 11:39 AM.  Appropriate orders placed.  [name] was informed that the remainder of the evaluation will be completed by another provider, this initial triage assessment does not replace that evaluation, and the importance of remaining in the ED until their evaluation is complete.     Suzy Bouchard, PA-C 07/31/22 1140

## 2022-08-01 ENCOUNTER — Ambulatory Visit: Payer: Medicaid Other | Admitting: Cardiology

## 2022-08-01 ENCOUNTER — Observation Stay (HOSPITAL_BASED_OUTPATIENT_CLINIC_OR_DEPARTMENT_OTHER): Payer: Medicare (Managed Care)

## 2022-08-01 DIAGNOSIS — I5033 Acute on chronic diastolic (congestive) heart failure: Secondary | ICD-10-CM | POA: Diagnosis not present

## 2022-08-01 DIAGNOSIS — I5031 Acute diastolic (congestive) heart failure: Secondary | ICD-10-CM

## 2022-08-01 LAB — CBC
HCT: 27.7 % — ABNORMAL LOW (ref 39.0–52.0)
Hemoglobin: 8.9 g/dL — ABNORMAL LOW (ref 13.0–17.0)
MCH: 24 pg — ABNORMAL LOW (ref 26.0–34.0)
MCHC: 32.1 g/dL (ref 30.0–36.0)
MCV: 74.7 fL — ABNORMAL LOW (ref 80.0–100.0)
Platelets: 116 10*3/uL — ABNORMAL LOW (ref 150–400)
RBC: 3.71 MIL/uL — ABNORMAL LOW (ref 4.22–5.81)
RDW: 18.6 % — ABNORMAL HIGH (ref 11.5–15.5)
WBC: 4.9 10*3/uL (ref 4.0–10.5)
nRBC: 0 % (ref 0.0–0.2)

## 2022-08-01 LAB — COMPREHENSIVE METABOLIC PANEL
ALT: 8 U/L (ref 0–44)
AST: 15 U/L (ref 15–41)
Albumin: 3.8 g/dL (ref 3.5–5.0)
Alkaline Phosphatase: 52 U/L (ref 38–126)
Anion gap: 11 (ref 5–15)
BUN: 23 mg/dL (ref 8–23)
CO2: 28 mmol/L (ref 22–32)
Calcium: 9.3 mg/dL (ref 8.9–10.3)
Chloride: 99 mmol/L (ref 98–111)
Creatinine, Ser: 2.19 mg/dL — ABNORMAL HIGH (ref 0.61–1.24)
GFR, Estimated: 33 mL/min — ABNORMAL LOW (ref >60)
Glucose, Bld: 91 mg/dL (ref 70–99)
Potassium: 3.8 mmol/L (ref 3.5–5.1)
Sodium: 138 mmol/L (ref 135–145)
Total Bilirubin: 1.5 mg/dL — ABNORMAL HIGH (ref 0.3–1.2)
Total Protein: 7.2 g/dL (ref 6.5–8.1)

## 2022-08-01 LAB — HIV ANTIBODY (ROUTINE TESTING W REFLEX): HIV Screen 4th Generation wRfx: NONREACTIVE

## 2022-08-01 MED ORDER — FUROSEMIDE 20 MG PO TABS: 60.0000 mg | ORAL_TABLET | Freq: Two times a day (BID) | ORAL | 1 refills | Status: DC

## 2022-08-01 MED ORDER — SPIRONOLACTONE 50 MG PO TABS: 50.0000 mg | ORAL_TABLET | Freq: Every day | ORAL | 0 refills | Status: DC

## 2022-08-01 MED ORDER — CARVEDILOL 3.125 MG PO TABS: 3.1250 mg | ORAL_TABLET | Freq: Two times a day (BID) | ORAL | 1 refills | Status: DC

## 2022-08-01 NOTE — Discharge Summary (Signed)
Physician Discharge Summary  John [name] [id number] DOB: March 07, 1956 DOA: 07/31/2022  PCP: Merlene Laughter, MD (Inactive)  Admit date: 07/31/2022 Discharge date: 08/01/2022  Admitted From: Home Disposition: Home  Recommendations for Outpatient Follow-up:  Please follow-up with new PCP as previously discussed as well as new cardiologist.  This is in reference to your concern for being poorly managed at your current clinic.  Home Health: None Equipment/Devices: No new equipment  Discharge Condition: Stable CODE STATUS: Full Diet recommendation: Low-salt low-fat diet, fluid restrict to 1500 cc  Brief/Interim Summary: John [name] is a 67 y.o. male with medical history significant of diastolic dysfunction heart failure, CVA, cirrhosis, alcohol abuse, hypertension recurrent episodes of ascites and edema and ambulatory dysfunction who presents to the ED with approximately 2 to 3 weeks of worsening leg pain ambulatory dysfunction and edema.  Patient reports being moderately noncompliant with medications and follow-up, he cannot recall any of his home medications, is unclear who he follows up with other than "[facility]" and reports that despite having leg pain and swelling for 3 weeks he has not contacted his primary care team.  He denies any chest pain nausea vomiting diarrhea constipation headache fevers chills   Patient admitted as above with acute hypoxic respiratory failure, likely secondary to heart failure exacerbation as well as AKI versus new onset CKD, unknown baseline.  Lengthy discussion with patient, there appears to be multifactorial etiology for his ongoing and recurrent exacerbations, most notably patient poorly follows up with his primary care and cardiology team, appears to be noncompliant with medications and diet recommendations.  Patient reports being poorly treated at his current clinic.  As such we have recommended he switch clinics if he feels he is not being  evaluated or if he is not comfortable maintaining this relationship.  Patient's labs and symptoms drastically improved with aggressive diuresis.  Patient did have borderline hypotension, as such home medications were adjusted as below to ensure room for diuretics without orthostatic or hypotensive episodes.  Discharge Diagnoses:  Principal Problem:   Acute exacerbation of congestive heart failure    Discharge Instructions  Discharge Instructions     Amb Referral to HF Clinic   Complete by: As directed       Allergies as of 08/01/2022       Reactions   Penicillins Other (See Comments)   Convulsions, Patient could not walk, childhood allergy  Has patient had a PCN reaction causing immediate rash, facial/tongue/throat swelling, SOB or lightheadedness with hypotension: No Has patient had a PCN reaction causing severe rash involving mucus membranes or skin necrosis: No Has patient had a PCN reaction that required hospitalization No Has patient had a PCN reaction occurring within the last 10 years: No If all of the above answers are "NO", then may proceed with Cephalosporin use.        Medication List     STOP taking these medications    amLODipine 10 MG tablet Commonly known as: NORVASC   colchicine 0.6 MG tablet   doxycycline 100 MG tablet Commonly known as: VIBRA-TABS   folic acid 1 MG tablet Commonly known as: FOLVITE   hydrALAZINE 50 MG tablet Commonly known as: APRESOLINE   oxyCODONE 5 MG immediate release tablet Commonly known as: Oxy IR/ROXICODONE   oxyCODONE-acetaminophen 5-325 MG tablet Commonly known as: Percocet       TAKE these medications    acetaminophen 325 MG tablet Commonly known as: TYLENOL Take 650 mg by mouth every 8 (eight)  hours as needed for moderate pain (NOT TO EXCEED 2,000 MG/DAY).   allopurinol 100 MG tablet Commonly known as: ZYLOPRIM Take 1 tablet (100 mg total) by mouth daily.   atorvastatin 40 MG tablet Commonly known as:  LIPITOR Take 40 mg by mouth daily.   brimonidine 0.15 % ophthalmic solution Commonly known as: ALPHAGAN Place 1 drop into both eyes at bedtime.   carvedilol 3.125 MG tablet Commonly known as: COREG Take 1 tablet (3.125 mg total) by mouth 2 (two) times daily with a meal.   ferrous sulfate 325 (65 FE) MG EC tablet Take 325 mg by mouth every Monday, Wednesday, and Friday.   Flovent HFA 220 MCG/ACT inhaler Generic drug: fluticasone Inhale 2 puffs into the lungs in the morning and at bedtime.   furosemide 20 MG tablet Commonly known as: LASIX Take 3 tablets (60 mg total) by mouth 2 (two) times daily. What changed:  medication strength how much to take when to take this   ketoconazole 2 % shampoo Commonly known as: NIZORAL Apply 1 application  topically 2 (two) times a week.   lactulose 10 GM/15ML solution Commonly known as: CHRONULAC Take 20 g by mouth every other day.   melatonin 5 MG Tabs Take 5 mg by mouth at bedtime.   omeprazole 40 MG capsule Commonly known as: PRILOSEC TAKE ONE CAPSULE BY MOUTH DAILY What changed: when to take this   potassium chloride 10 MEQ tablet Commonly known as: KLOR-CON Take 10 mEq by mouth daily.   ProAir HFA 108 (90 Base) MCG/ACT inhaler Generic drug: albuterol Inhale 2 puffs into the lungs every 6 (six) hours as needed for wheezing or shortness of breath.   Salonpas Pain Relief Patch Ptch Apply 1 patch topically daily as needed (for pain- affected area).   spironolactone 50 MG tablet Commonly known as: ALDACTONE Take 1 tablet (50 mg total) by mouth daily. What changed: when to take this   terbinafine 1 % cream Commonly known as: LAMISIL Apply 1 Application topically See admin instructions. Apply to feet as directed every other day        Allergies  Allergen Reactions   Penicillins Other (See Comments)    Convulsions, Patient could not walk, childhood allergy   Has patient had a PCN reaction causing immediate rash,  facial/tongue/throat swelling, SOB or lightheadedness with hypotension: No Has patient had a PCN reaction causing severe rash involving mucus membranes or skin necrosis: No Has patient had a PCN reaction that required hospitalization No Has patient had a PCN reaction occurring within the last 10 years: No If all of the above answers are "NO", then may proceed with Cephalosporin use.    Consultations: None  Procedures/Studies: DG Chest 1 View  Result Date: 07/31/2022 CLINICAL DATA:  Shortness of breath, edema in the lower extremities EXAM: CHEST  1 VIEW COMPARISON:  11/13/2021 FINDINGS: Transverse diameter of heart is increased. There is slight prominence of central pulmonary vessels. There are no signs of alveolar pulmonary edema. There is increase in markings in right lower lung field. There is minimal blunting of lateral CP angles. There is no pneumothorax. Metallic coil is seen in the epigastrium, possibly resolved from previous vascular intervention. IMPRESSION: Cardiomegaly. Central pulmonary vessels are slightly prominent without signs of alveolar pulmonary edema. Increased markings are seen in right lower lung field suggesting mild asymmetric interstitial pulmonary edema or interstitial pneumonia. Lateral CP angles are indistinct suggesting possible pleural thickening or minimal effusions. Electronically Signed   By: Elmer Picker  M.D.   On: 07/31/2022 11:59     Subjective: No acute issues or events overnight, feels back to baseline requesting discharge home which is certainly reasonable   Discharge Exam: Vitals:   08/01/22 0816 08/01/22 1305  BP: 108/71 110/76  Pulse: 83 73  Resp: 19 18  Temp:  97.7 F (36.5 C)  SpO2: 95% 96%   Vitals:   08/01/22 0500 08/01/22 0545 08/01/22 0816 08/01/22 1305  BP:  104/71 108/71 110/76  Pulse:  79 83 73  Resp:  18 19 18   Temp:  (!) 97.5 F (36.4 C)  97.7 F (36.5 C)  TempSrc:  Oral  Oral  SpO2:  93% 95% 96%  Weight: 100.9 kg      Height:        General: Pt is alert, awake, not in acute distress Cardiovascular: RRR, S1/S2 +, no rubs, no gallops Respiratory: CTA bilaterally, no wheezing, no rhonchi Abdominal: Soft, NT, ND, bowel sounds + Extremities: no edema, no cyanosis    The results of significant diagnostics from this hospitalization (including imaging, microbiology, ancillary and laboratory) are listed below for reference.     Microbiology: No results found for this or any previous visit (from the past 240 hour(s)).   Labs: BNP (last 3 results) Recent Labs    11/13/21 1251 07/31/22 1155  BNP 433.5* 123XX123*   Basic Metabolic Panel: Recent Labs  Lab 07/31/22 1154 08/01/22 0535  NA 134* 138  K 3.6 3.8  CL 97* 99  CO2 26 28  GLUCOSE 87 91  BUN 21 23  CREATININE 2.17* 2.19*  CALCIUM 9.4 9.3   Liver Function Tests: Recent Labs  Lab 08/01/22 0535  AST 15  ALT 8  ALKPHOS 52  BILITOT 1.5*  PROT 7.2  ALBUMIN 3.8   No results for input(s): "LIPASE", "AMYLASE" in the last 168 hours. No results for input(s): "AMMONIA" in the last 168 hours. CBC: Recent Labs  Lab 07/31/22 1154 08/01/22 0535  WBC 5.5 4.9  NEUTROABS 4.2  --   HGB 9.4* 8.9*  HCT 28.9* 27.7*  MCV 73.9* 74.7*  PLT 120* 116*   Cardiac Enzymes: No results for input(s): "CKTOTAL", "CKMB", "CKMBINDEX", "TROPONINI" in the last 168 hours. BNP: Invalid input(s): "POCBNP" CBG: No results for input(s): "GLUCAP" in the last 168 hours. D-Dimer No results for input(s): "DDIMER" in the last 72 hours. Hgb A1c No results for input(s): "HGBA1C" in the last 72 hours. Lipid Profile No results for input(s): "CHOL", "HDL", "LDLCALC", "TRIG", "CHOLHDL", "LDLDIRECT" in the last 72 hours. Thyroid function studies No results for input(s): "TSH", "T4TOTAL", "T3FREE", "THYROIDAB" in the last 72 hours.  Invalid input(s): "FREET3" Anemia work up No results for input(s): "VITAMINB12", "FOLATE", "FERRITIN", "TIBC", "IRON", "RETICCTPCT" in  the last 72 hours. Urinalysis    Component Value Date/Time   COLORURINE YELLOW 05/03/2018 1628   APPEARANCEUR CLEAR 05/03/2018 1628   LABSPEC 1.012 05/03/2018 1628   PHURINE 5.0 05/03/2018 1628   GLUCOSEU NEGATIVE 05/03/2018 1628   HGBUR MODERATE (A) 05/03/2018 1628   BILIRUBINUR NEGATIVE 05/03/2018 1628   KETONESUR NEGATIVE 05/03/2018 1628   PROTEINUR NEGATIVE 05/03/2018 1628   UROBILINOGEN 1.0 10/31/2014 2127   NITRITE NEGATIVE 05/03/2018 1628   LEUKOCYTESUR NEGATIVE 05/03/2018 1628   Sepsis Labs Recent Labs  Lab 07/31/22 1154 08/01/22 0535  WBC 5.5 4.9   Microbiology No results found for this or any previous visit (from the past 240 hour(s)).   Time coordinating discharge: Over 30 minutes  SIGNED:  Little Ishikawa, DO [facility] 08/01/2022, 3:32 PM Pager   If 7PM-7AM, please contact night-coverage [email or web address]

## 2022-08-01 NOTE — TOC Initial Note (Signed)
Transition of Care [facility]) - Initial/Assessment Note    Patient Details  Name: John [name] MRN: [id number] Date of Birth: [birth year]  [facility]) CM/SW Contact:    Dessa Phi, RN Phone Number: 08/01/2022, 1:21 PM  Clinical Narrative:  Continue to monitor for d/c needs.                 Expected Discharge Plan: Home/Self Care Barriers to Discharge: Continued Medical Work up   Patient Goals and CMS Choice Patient states their goals for this hospitalization and ongoing recovery are::  (Home) CMS Medicare.gov Compare Post Acute Care list provided to:: Patient Represenative (must comment) Choice offered to / list presented to : Patient [place] ownership interest in [facility].provided to:: Patient    Expected Discharge Plan and Services   Discharge Planning Services: CM Consult Post Acute Care Choice: NA Living arrangements for the past 2 months: Single Family Home                                      Prior Living Arrangements/Services Living arrangements for the past 2 months: Single Family Home Lives with:: Self   Do you feel safe going back to the place where you live?: Yes      Need for Family Participation in Patient Care: Yes (Comment) Care giver support system in place?: Yes (comment)   Criminal Activity/Legal Involvement Pertinent to Current Situation/Hospitalization: No - Comment as needed  Activities of Daily Living Home Assistive Devices/Equipment: Eyeglasses, Dentures (specify type) (rollator walker) ADL Screening (condition at time of admission) Patient's cognitive ability adequate to safely complete daily activities?: Yes Is the patient deaf or have difficulty hearing?: No Does the patient have difficulty seeing, even when wearing glasses/contacts?: No Does the patient have difficulty concentrating, remembering, or making decisions?: No Patient able to express need for assistance with ADLs?: Yes Does the patient have  difficulty dressing or bathing?: No Independently performs ADLs?: Yes (appropriate for developmental age) Does the patient have difficulty walking or climbing stairs?: Yes Weakness of Legs: Both Weakness of Arms/Hands: None  Permission Sought/Granted Permission sought to share information with : Case Manager Permission granted to share information with : Yes, Verbal Permission Granted  Share Information with NAME:  (Case manager)           Emotional Assessment Appearance:: Appears stated age Attitude/Demeanor/Rapport: Gracious Affect (typically observed): Accepting Orientation: : Oriented to Self, Oriented to Place, Oriented to  Time, Oriented to Situation Alcohol / Substance Use: Alcohol Use Psych Involvement: No (comment)  Admission diagnosis:  Acute exacerbation of congestive heart failure [I50.9] Acute on chronic congestive heart failure, unspecified heart failure type [I50.9] Patient Active Problem List   Diagnosis Date Noted   Acute exacerbation of congestive heart failure 07/31/2022   Renal mass, left 07/05/2020   Severe obesity (BMI 35.0-39.9) with comorbidity 06/25/2018   Acute gouty arthritis 05/06/2018   Polyarthralgia 05/03/2018   History of renal cell carcinoma 01/12/2018   Esophageal varices without bleeding    Renal cell carcinoma of left kidney 11/08/2016   Malnutrition of moderate degree 07/20/2015   CHF (congestive heart failure) 07/19/2015   Abdominal pain 07/19/2015   Renal failure (ARF), acute on chronic 07/19/2015   Acute-on-chronic kidney injury    Hyponatremia 07/07/2015   Dehydration 07/07/2015   Acute respiratory failure 07/07/2015   Chronic kidney disease, stage 3 05/25/2015   CAP (community  acquired pneumonia) 05/23/2015   Acute respiratory failure with hypoxia 05/23/2015   Fluid overload 10/31/2014   Hepatitis C 10/31/2014   Ascites of liver 10/31/2014   Hepatitis, viral    Edema of abdominal wall    Pedal edema    Alcoholism     Abdominal edema on examination 09/01/2014   Chronic liver failure    Alcoholic cirrhosis of liver with ascites (HCC)    Chronic hepatitis C with cirrhosis    Cardiac hypertrophy    Fever of unknown origin    Diastolic dysfunction    Mental status change    Metabolic encephalopathy    Pulmonary edema    Acute encephalopathy    Ascites due to alcoholic cirrhosis (HCC)    Hypernatremia    Other specified fever    Varices, gastric    Acute on chronic renal failure    Elevated transaminase level    Essential hypertension    Gastric varices    Upper GI bleed 08/14/2014   Acute blood loss anemia 08/14/2014   Alcohol abuse 08/14/2014   Coagulopathy 08/14/2014   AKI (acute kidney injury) ([place]) 08/14/2014   GI bleed 08/14/2014   GIB (gastrointestinal bleeding) 08/14/2014   Bleeding gastric varices 08/14/2014   Acute upper GI bleed    Cirrhosis ([place]) 07/27/2014   Abdominal pain, generalized 07/27/2014   Ascites 07/26/2014   Optic neuritis 07/11/2014   Abnormal LFTs    Vision loss of left eye 06/24/2014   Accelerated hypertension 06/24/2014   Elevated LFTs 06/24/2014   Polysubstance abuse 06/24/2014   CKD (chronic kidney disease) stage 2, GFR 60-89 ml/min 06/24/2014   SOB (shortness of breath) 06/24/2014   Vision loss, left eye 06/24/2014   Thrombocytopenia ([place]) 06/24/2014   Hypokalemia 06/24/2014   Elevated troponin 06/24/2014   Essential hypertension, benign 12/29/2012   Neck pain 12/29/2012   Low back pain 12/29/2012   PCP:  Merlene Laughter, MD (Inactive) Pharmacy:   [facility] [place], [place] - [address] [place] [address] Phone: [phone] Fax: [phone]     Social Determinants of Health (SDOH) Social History: SDOH Screenings   Food Insecurity: No Food Insecurity (07/31/2022)  Housing: Low Risk  (07/31/2022)  Transportation Needs: No Transportation Needs (07/31/2022)  Utilities: Not At Risk  (07/31/2022)  Tobacco Use: Medium Risk (07/31/2022)   SDOH Interventions:     Readmission Risk Interventions     No data to display

## 2022-08-01 NOTE — Progress Notes (Signed)
Echocardiogram 2D Echocardiogram has been performed.  John [name] 08/01/2022, 3:16 PM

## 2022-08-01 NOTE — Consult Note (Signed)
[facility] Nurse Consult Note: Reason for Consult: left foot wound and MASD of the webspaces. Patient admitted for leg pain, history of CHF, cirrhosis, ascites, edema   Wound type: irritant contact dermatitis ICD-10 CM Codes for Irritant Dermatitis L30.4  - Erythema intertrigo. Also used for abrasion of the hand, chafing of the skin, dermatitis due to sweating and friction, friction dermatitis, friction eczema, and genital/thigh intertrigo.  Pressure Injury POA: NA Measurement: no measurable area Wound bed: weeping crust intra web spaces of the toes secondary to edema; poor hygiene of the bilateral feet, fungal nails  Drainage (amount, consistency, odor) scant Periwound: intact, chronic edema, skin changes related to chronic edema Dressing procedure/placement/frequency:single layer of xeroform to affected web spaces.  Dry dressing. Change daily    Discussed POC with patient and bedside nurse.  Re consult if needed, will not follow at this time. Thanks  Halley Kincer [employer], RN,CWOCN, CNS, [place] [phone])

## 2022-08-02 LAB — ECHOCARDIOGRAM COMPLETE
Area-P 1/2: 3.42 cm2
Calc EF: 55.5 %
Height: 68 in
S' Lateral: 3.2 cm
Single Plane A2C EF: 54.9 %
Single Plane A4C EF: 56.3 %
Weight: 3560 oz

## 2022-08-05 ENCOUNTER — Observation Stay (HOSPITAL_COMMUNITY)
Admission: EM | Admit: 2022-08-05 | Discharge: 2022-08-06 | Disposition: A | Discharge status: Home / Self Care | Payer: Medicare (Managed Care) | Attending: Internal Medicine | Admitting: Internal Medicine

## 2022-08-05 ENCOUNTER — Emergency Department (HOSPITAL_COMMUNITY): Payer: Medicare (Managed Care)

## 2022-08-05 ENCOUNTER — Encounter (HOSPITAL_COMMUNITY): Payer: Self-pay

## 2022-08-05 ENCOUNTER — Other Ambulatory Visit: Payer: Self-pay

## 2022-08-05 DIAGNOSIS — N1832 Chronic kidney disease, stage 3b: Secondary | ICD-10-CM | POA: Diagnosis not present

## 2022-08-05 DIAGNOSIS — Z87891 Personal history of nicotine dependence: Secondary | ICD-10-CM | POA: Insufficient documentation

## 2022-08-05 DIAGNOSIS — F1910 Other psychoactive substance abuse, uncomplicated: Secondary | ICD-10-CM | POA: Diagnosis present

## 2022-08-05 DIAGNOSIS — I872 Venous insufficiency (chronic) (peripheral): Secondary | ICD-10-CM | POA: Diagnosis present

## 2022-08-05 DIAGNOSIS — Z1152 Encounter for screening for COVID-19: Secondary | ICD-10-CM | POA: Insufficient documentation

## 2022-08-05 DIAGNOSIS — I5189 Other ill-defined heart diseases: Secondary | ICD-10-CM

## 2022-08-05 DIAGNOSIS — I2510 Atherosclerotic heart disease of native coronary artery without angina pectoris: Secondary | ICD-10-CM | POA: Diagnosis not present

## 2022-08-05 DIAGNOSIS — J45909 Unspecified asthma, uncomplicated: Secondary | ICD-10-CM | POA: Insufficient documentation

## 2022-08-05 DIAGNOSIS — I130 Hypertensive heart and chronic kidney disease with heart failure and stage 1 through stage 4 chronic kidney disease, or unspecified chronic kidney disease: Secondary | ICD-10-CM | POA: Diagnosis not present

## 2022-08-05 DIAGNOSIS — K7031 Alcoholic cirrhosis of liver with ascites: Principal | ICD-10-CM | POA: Diagnosis present

## 2022-08-05 DIAGNOSIS — I5033 Acute on chronic diastolic (congestive) heart failure: Secondary | ICD-10-CM | POA: Diagnosis not present

## 2022-08-05 DIAGNOSIS — I10 Essential (primary) hypertension: Secondary | ICD-10-CM | POA: Diagnosis present

## 2022-08-05 DIAGNOSIS — Z79899 Other long term (current) drug therapy: Secondary | ICD-10-CM | POA: Insufficient documentation

## 2022-08-05 DIAGNOSIS — H409 Unspecified glaucoma: Secondary | ICD-10-CM | POA: Diagnosis present

## 2022-08-05 DIAGNOSIS — Z8673 Personal history of transient ischemic attack (TIA), and cerebral infarction without residual deficits: Secondary | ICD-10-CM | POA: Diagnosis not present

## 2022-08-05 DIAGNOSIS — Z85828 Personal history of other malignant neoplasm of skin: Secondary | ICD-10-CM | POA: Insufficient documentation

## 2022-08-05 DIAGNOSIS — R0602 Shortness of breath: Secondary | ICD-10-CM | POA: Diagnosis present

## 2022-08-05 DIAGNOSIS — J449 Chronic obstructive pulmonary disease, unspecified: Secondary | ICD-10-CM | POA: Insufficient documentation

## 2022-08-05 DIAGNOSIS — I864 Gastric varices: Secondary | ICD-10-CM | POA: Diagnosis present

## 2022-08-05 DIAGNOSIS — J441 Chronic obstructive pulmonary disease with (acute) exacerbation: Secondary | ICD-10-CM | POA: Diagnosis present

## 2022-08-05 DIAGNOSIS — I509 Heart failure, unspecified: Secondary | ICD-10-CM

## 2022-08-05 LAB — CBC WITH DIFFERENTIAL/PLATELET
Abs Immature Granulocytes: 0.01 10*3/uL (ref 0.00–0.07)
Basophils Absolute: 0 10*3/uL (ref 0.0–0.1)
Basophils Relative: 0 %
Eosinophils Absolute: 0.1 10*3/uL (ref 0.0–0.5)
Eosinophils Relative: 1 %
HCT: 29.3 % — ABNORMAL LOW (ref 39.0–52.0)
Hemoglobin: 9.4 g/dL — ABNORMAL LOW (ref 13.0–17.0)
Immature Granulocytes: 0 %
Lymphocytes Relative: 9 %
Lymphs Abs: 0.5 10*3/uL — ABNORMAL LOW (ref 0.7–4.0)
MCH: 23.8 pg — ABNORMAL LOW (ref 26.0–34.0)
MCHC: 32.1 g/dL (ref 30.0–36.0)
MCV: 74.2 fL — ABNORMAL LOW (ref 80.0–100.0)
Monocytes Absolute: 0.7 10*3/uL (ref 0.1–1.0)
Monocytes Relative: 14 %
Neutro Abs: 4 10*3/uL (ref 1.7–7.7)
Neutrophils Relative %: 76 %
Platelets: 126 10*3/uL — ABNORMAL LOW (ref 150–400)
RBC: 3.95 MIL/uL — ABNORMAL LOW (ref 4.22–5.81)
RDW: 18.6 % — ABNORMAL HIGH (ref 11.5–15.5)
WBC: 5.3 10*3/uL (ref 4.0–10.5)
nRBC: 0 % (ref 0.0–0.2)

## 2022-08-05 LAB — BRAIN NATRIURETIC PEPTIDE: B Natriuretic Peptide: 452.3 pg/mL — ABNORMAL HIGH (ref 0.0–100.0)

## 2022-08-05 LAB — COMPREHENSIVE METABOLIC PANEL
ALT: 8 U/L (ref 0–44)
AST: 18 U/L (ref 15–41)
Albumin: 4.1 g/dL (ref 3.5–5.0)
Alkaline Phosphatase: 57 U/L (ref 38–126)
Anion gap: 11 (ref 5–15)
BUN: 24 mg/dL — ABNORMAL HIGH (ref 8–23)
CO2: 26 mmol/L (ref 22–32)
Calcium: 9.1 mg/dL (ref 8.9–10.3)
Chloride: 95 mmol/L — ABNORMAL LOW (ref 98–111)
Creatinine, Ser: 2.17 mg/dL — ABNORMAL HIGH (ref 0.61–1.24)
GFR, Estimated: 33 mL/min — ABNORMAL LOW (ref >60)
Glucose, Bld: 101 mg/dL — ABNORMAL HIGH (ref 70–99)
Potassium: 4.1 mmol/L (ref 3.5–5.1)
Sodium: 132 mmol/L — ABNORMAL LOW (ref 135–145)
Total Bilirubin: 0.9 mg/dL (ref 0.3–1.2)
Total Protein: 7.8 g/dL (ref 6.5–8.1)

## 2022-08-05 LAB — RESP PANEL BY RT-PCR (RSV, FLU A&B, COVID)  RVPGX2
Influenza A by PCR: NEGATIVE
Influenza B by PCR: NEGATIVE
Resp Syncytial Virus by PCR: NEGATIVE
SARS Coronavirus 2 by RT PCR: NEGATIVE

## 2022-08-05 IMAGING — CT CT GUIDANCE TISSUE ABLATION
1 of 19 series · 6 of 32 positions shown, 11 images · non-contrast
Comparison: none

INDICATION: Enlarging left upper pole renal mass by surveillance [HOSPITAL] the
margin of a previous ablation site.

[Series 2: i-spiral 5.0 bf37 · axial · 0.98mm/px · z∈[-202,-62]mm · 6 of 57 slices shown, 11 images]
[im 9/57  soft-tissue]
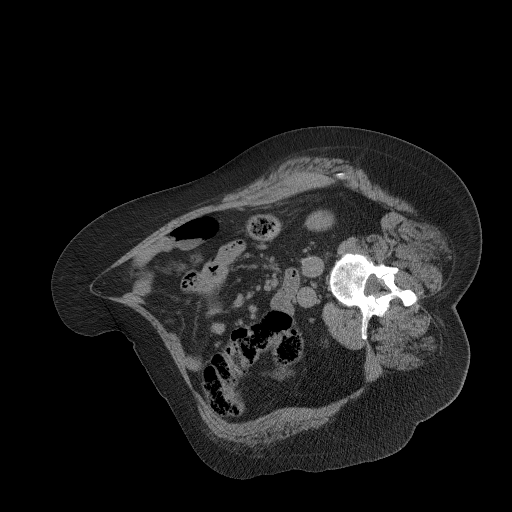
[im 9/57  bone]
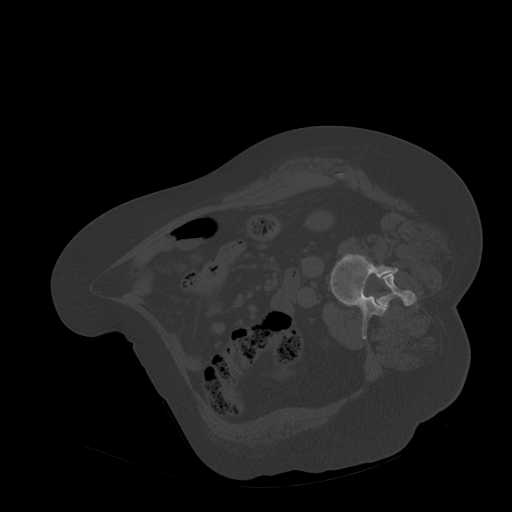
[im 17/57  soft-tissue]
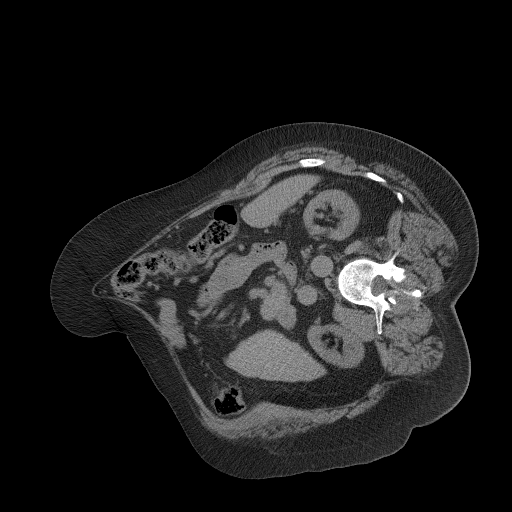
[im 25/57  soft-tissue]
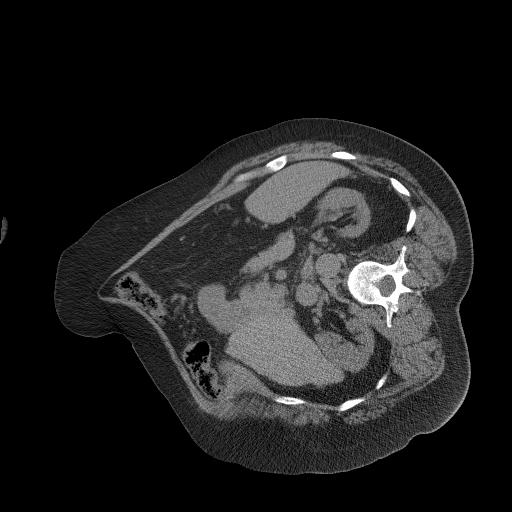
[im 25/57  lung]
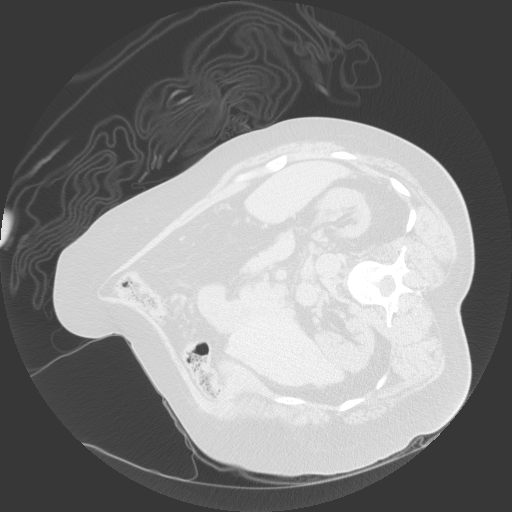
[im 33/57  soft-tissue]
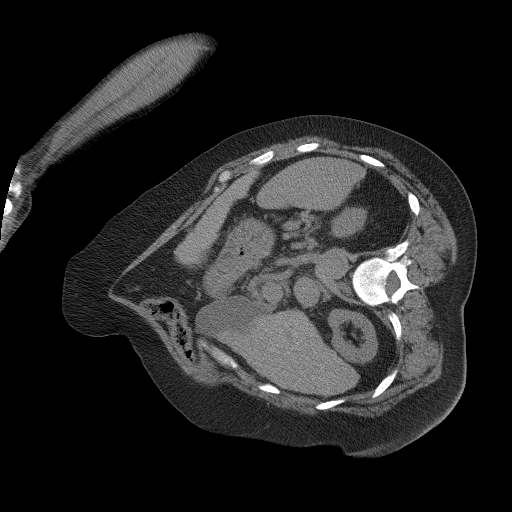
[im 33/57  lung]
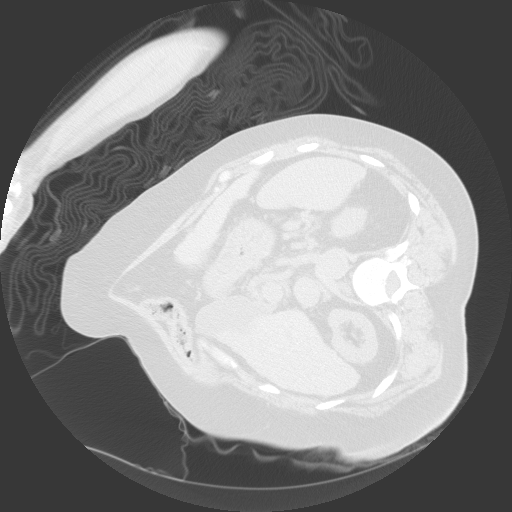
[im 41/57  soft-tissue]
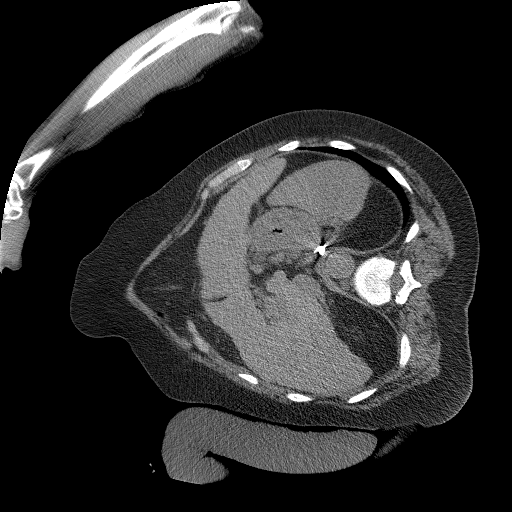
[im 41/57  lung]
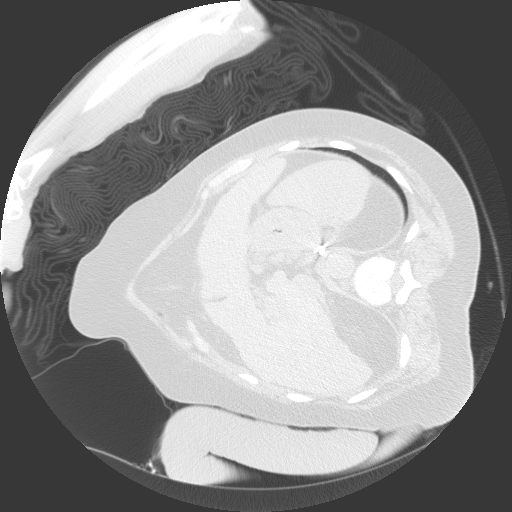
[im 49/57  soft-tissue]
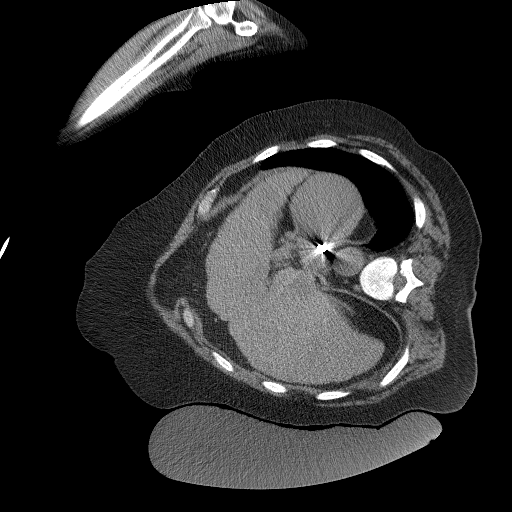
[im 49/57  lung]
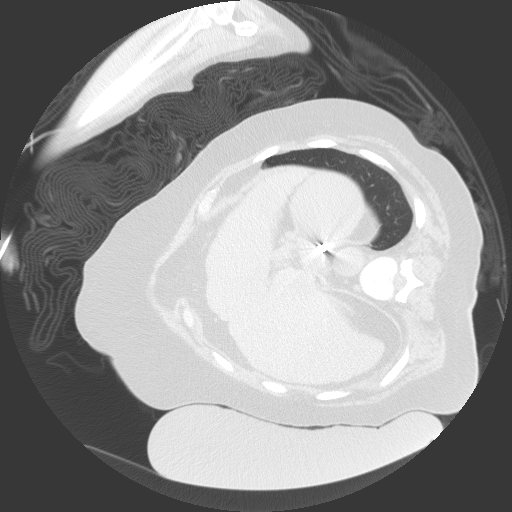

[6 of 32 positions shown; findings below may reference images not displayed]

EXAM:
CT-GUIDED PERCUTANEOUS CRYOABLATION OF EXOPHYTIC LEFT UPPER POLE
RENAL MASS

ANESTHESIA/SEDATION:
General

MEDICATIONS:
NONE.

CONTRAST:  NONE.

PROCEDURE:
The procedure, risks, benefits, and alternatives were explained to
the patient. Questions regarding the procedure were encouraged and
answered. The patient understands and consents to the procedure.

The patient was placed under general anesthesia. Initial unenhanced
CT was performed in a RIGHT SIDE DOWN DECUBITUS position to localize
the exophytic left upper pole renal mass measuring 1.7 cm.

The patient was prepped with ChloraPrep in a sterile fashion, and a
sterile drape was applied covering the operative field. A sterile
gown and sterile gloves were used for the procedure.

Under CT guidance, a ice pearl 2.1 OLIMPIA percutaneous cryoablation
probe was advanced into the 0.7 cm exophytic left upper pole renal
mass. Probe positioning was confirmed by CT prior to cryoablation.
Imaging was correlated with the prior MRI.

Prior to ablation, a 22 gauge needle was advanced adjacent to the
above ablation probe. 30 cc diluted contrast instilled for hydro
dissection of the lesion away from the adjacent spleen.

Cryoablation was performed through the single ice pearl 2.1 OLIMPIA
needle. Initial 10 minute cycle of cryoablation was performed. This
was followed by a 6 minute thaw cycle. A second 10 minute cycle of
cryoablation was then performed. During ablation, periodic CT
imaging was performed to monitor ice ball formation and morphology.
After active thaw and 30 second cautery, the cryoablation probe was
removed.

Post-procedural CT was performed.

COMPLICATIONS:
None immediate.
FINDINGS: Imaging confirms single needle placement centered within the left
upper pole exophytic 1.7 cm renal mass. Successful hydro dissection
of the lesion to separate the adjacent spleen. Surveillance imaging
during the treatment confirms adequate ice ball coverage of the
lesion. Postprocedure imaging demonstrates trace amount of
retroperitoneal perinephric hemorrhage related to the needle
placement. This remained unchanged throughout the procedure. No
expanding or enlarging hematoma.
IMPRESSION: CT guided percutaneous cryoablation of a slowly enlarging 1.7 cm
left upper pole renal mass at a previous ablation site.

The patient will be observed overnight. Initial follow-up will be
performed in approximately 4 weeks.

## 2022-08-05 MED ORDER — BISACODYL 5 MG PO TBEC: 5.0000 mg | DELAYED_RELEASE_TABLET | Freq: Every day | ORAL | Status: DC | PRN

## 2022-08-05 MED ORDER — ATORVASTATIN CALCIUM 40 MG PO TABS
40.0000 mg | ORAL_TABLET | Freq: Every day | ORAL | Status: DC
  Administered 2022-08-06: 40 mg via ORAL
  Filled 2022-08-05: qty 1

## 2022-08-05 MED ORDER — ONDANSETRON HCL 4 MG/2ML IJ SOLN: 4.0000 mg | Freq: Four times a day (QID) | INTRAMUSCULAR | Status: DC | PRN

## 2022-08-05 MED ORDER — OXYCODONE HCL 5 MG PO TABS
5.0000 mg | ORAL_TABLET | ORAL | Status: DC | PRN
  Administered 2022-08-05 – 2022-08-06 (×3): 5 mg via ORAL
  Filled 2022-08-05 (×3): qty 1

## 2022-08-05 MED ORDER — FLUTICASONE PROPIONATE HFA 220 MCG/ACT IN AERO: 2.0000 | INHALATION_SPRAY | Freq: Two times a day (BID) | RESPIRATORY_TRACT | Status: DC

## 2022-08-05 MED ORDER — BRIMONIDINE TARTRATE 0.15 % OP SOLN
1.0000 [drp] | Freq: Two times a day (BID) | OPHTHALMIC | Status: DC
  Administered 2022-08-05 – 2022-08-06 (×2): 1 [drp] via OPHTHALMIC
  Filled 2022-08-05 (×2): qty 5

## 2022-08-05 MED ORDER — DOCUSATE SODIUM 100 MG PO CAPS
100.0000 mg | ORAL_CAPSULE | Freq: Two times a day (BID) | ORAL | Status: DC
  Administered 2022-08-05 – 2022-08-06 (×2): 100 mg via ORAL
  Filled 2022-08-05 (×2): qty 1

## 2022-08-05 MED ORDER — POLYETHYLENE GLYCOL 3350 17 G PO PACK: 17.0000 g | PACK | Freq: Every day | ORAL | Status: DC | PRN

## 2022-08-05 MED ORDER — HYDRALAZINE HCL 20 MG/ML IJ SOLN: 5.0000 mg | INTRAMUSCULAR | Status: DC | PRN

## 2022-08-05 MED ORDER — ONDANSETRON HCL 4 MG PO TABS: 4.0000 mg | ORAL_TABLET | Freq: Four times a day (QID) | ORAL | Status: DC | PRN

## 2022-08-05 MED ORDER — PANTOPRAZOLE SODIUM 40 MG PO TBEC
40.0000 mg | DELAYED_RELEASE_TABLET | Freq: Every day | ORAL | Status: DC
  Administered 2022-08-06: 40 mg via ORAL
  Filled 2022-08-05: qty 1

## 2022-08-05 MED ORDER — CARVEDILOL 3.125 MG PO TABS
3.1250 mg | ORAL_TABLET | Freq: Two times a day (BID) | ORAL | Status: DC
  Administered 2022-08-05 – 2022-08-06 (×2): 3.125 mg via ORAL
  Filled 2022-08-05 (×3): qty 1

## 2022-08-05 MED ORDER — SPIRONOLACTONE 25 MG PO TABS
50.0000 mg | ORAL_TABLET | Freq: Every day | ORAL | Status: DC
  Administered 2022-08-06: 50 mg via ORAL
  Filled 2022-08-05: qty 2

## 2022-08-05 MED ORDER — BUDESONIDE 0.5 MG/2ML IN SUSP
1.0000 mg | Freq: Two times a day (BID) | RESPIRATORY_TRACT | Status: DC
  Administered 2022-08-06: 1 mg via RESPIRATORY_TRACT
  Filled 2022-08-05: qty 4

## 2022-08-05 MED ORDER — ALLOPURINOL 100 MG PO TABS
100.0000 mg | ORAL_TABLET | Freq: Every day | ORAL | Status: DC
  Administered 2022-08-06: 100 mg via ORAL
  Filled 2022-08-05: qty 1

## 2022-08-05 MED ORDER — ALBUTEROL SULFATE (2.5 MG/3ML) 0.083% IN NEBU: 3.0000 mL | INHALATION_SOLUTION | Freq: Four times a day (QID) | RESPIRATORY_TRACT | Status: DC | PRN

## 2022-08-05 MED ORDER — FUROSEMIDE 40 MG PO TABS
60.0000 mg | ORAL_TABLET | Freq: Two times a day (BID) | ORAL | Status: DC
  Administered 2022-08-06: 60 mg via ORAL
  Filled 2022-08-05: qty 2

## 2022-08-05 MED ORDER — MELATONIN 5 MG PO TABS
5.0000 mg | ORAL_TABLET | Freq: Every day | ORAL | Status: DC
  Administered 2022-08-05: 5 mg via ORAL
  Filled 2022-08-05: qty 1

## 2022-08-05 NOTE — ED Provider Notes (Signed)
[place] [facility]PARTMENT AT [facility] Provider Note   CSN: [id number] Arrival date & time: 08/05/22  1145     History  Chief Complaint  Patient presents with   Shortness of Breath   Leg Swelling    John [name] is a 67 y.o. male.  With an extensive history including but not limited to hypertension, polysubstance abuse, CKD, CAD, PAD, asthma, CHF with an EF of 55 to 60% on 08/01/2022 who presents to the ED for evaluation of lower extremity and abdominal swelling as well as shortness of breath.  He states he was admitted for this last week.  He was discharged a few days ago.  His symptoms returned yesterday afternoon.  He reports compliance with his Lasix and spironolactone.  He states his legs have gotten so swollen that the skin has burst.  His shortness of breath is at rest and increased with exertion.  He denies cough, chest pain, dizziness, lightheadedness, nausea, vomiting, change in bowel habits, fevers, chills.  He is not on oxygen at home.  He states his abdomen has become tender due to the distention as well.  Reports history of cirrhosis.  States he has not drank alcohol in 10 years.   Shortness of Breath      Home Medications Prior to Admission medications   Medication Sig Start Date End Date Taking? Authorizing Provider  acetaminophen (TYLENOL) 325 MG tablet Take 650 mg by mouth every 8 (eight) hours as needed for moderate pain (NOT TO EXCEED 2,000 MG/DAY).    [provider]  allopurinol (ZYLOPRIM) 100 MG tablet Take 1 tablet (100 mg total) by mouth daily. 05/12/18   [name], Daniel V, MD  atorvastatin (LIPITOR) 40 MG tablet Take 40 mg by mouth daily. 04/29/19   [provider]  brimonidine (ALPHAGAN) 0.15 % ophthalmic solution Place 1 drop into both eyes at bedtime.    [provider]  carvedilol (COREG) 3.125 MG tablet Take 1 tablet (3.125 mg total) by mouth 2 (two) times daily with a meal. 08/01/22   [name], [name],  MD  ferrous sulfate 325 (65 FE) MG EC tablet Take 325 mg by mouth every Monday, Wednesday, and Friday.    [provider]  FLOVENT HFA 220 MCG/ACT inhaler Inhale 2 puffs into the lungs in the morning and at bedtime. 01/27/19   [provider]  furosemide (LASIX) 20 MG tablet Take 3 tablets (60 mg total) by mouth 2 (two) times daily. 08/01/22   [name], [name], MD  ketoconazole (NIZORAL) 2 % shampoo Apply 1 application  topically 2 (two) times a week.    [provider]  lactulose (CHRONULAC) 10 GM/15ML solution Take 20 g by mouth every other day. Patient not taking: Reported on 07/31/2022 11/01/16   [provider]  melatonin 5 MG TABS Take 5 mg by mouth at bedtime.    [provider]  Menthol-Methyl Salicylate (SALONPAS PAIN RELIEF PATCH) PTCH Apply 1 patch topically daily as needed (for pain- affected area).    [provider]  omeprazole (PRILOSEC) 40 MG capsule TAKE ONE CAPSULE BY MOUTH DAILY Patient taking differently: Take 40 mg by mouth daily before breakfast. 07/02/18   [name], Malcolm T, MD  potassium chloride (KLOR-CON) 10 MEQ tablet Take 10 mEq by mouth daily.    [provider]  PROAIR HFA 108 [phone](90 Base) MCG/ACT inhaler Inhale 2 puffs into the lungs every 6 (six) hours as needed for wheezing or shortness of breath. 10/02/17  [provider]  spironolactone (ALDACTONE) 50 MG tablet Take 1 tablet (50 mg total) by mouth daily. 08/01/22   Azucena Fallen, MD  terbinafine (LAMISIL) 1 % cream Apply 1 Application topically See admin instructions. Apply to feet as directed every other day    [provider]      Allergies    Penicillins    Review of Systems   Review of Systems  Respiratory:  Positive for shortness of breath.   Cardiovascular:  Positive for leg swelling.  All other systems reviewed and are negative.   Physical Exam Updated Vital Signs BP 117/87   Pulse 76   Temp 98.8 F (37.1 C)   Resp 18    SpO2 95%  Physical Exam Vitals and nursing note reviewed.  Constitutional:      Appearance: He is well-developed. He is not ill-appearing, toxic-appearing or diaphoretic.  HENT:     Head: Normocephalic and atraumatic.  Eyes:     Conjunctiva/sclera: Conjunctivae normal.  Cardiovascular:     Rate and Rhythm: Normal rate and regular rhythm.     Heart sounds: No murmur heard. Pulmonary:     Effort: Pulmonary effort is normal. No respiratory distress.     Breath sounds: Normal breath sounds.  Abdominal:     Palpations: Abdomen is soft.     Tenderness: There is no abdominal tenderness.     Comments: Abdomen distended, however still soft.  Tympany on percussion.  No abdominal TTP.  Musculoskeletal:        General: No swelling.     Cervical back: Neck supple.     Right lower leg: Edema present.     Left lower leg: Edema present.     Comments: Bilateral 2+ pitting edema to the level of the knee  Skin:    General: Skin is warm and dry.     Capillary Refill: Capillary refill takes less than 2 seconds.  Neurological:     General: No focal deficit present.     Mental Status: He is alert and oriented to person, place, and time.  Psychiatric:        Mood and Affect: Mood normal.     ED Results / Procedures / Treatments   Labs (all labs ordered are listed, but only abnormal results are displayed) Labs Reviewed  BRAIN NATRIURETIC PEPTIDE - Abnormal; Notable for the following components:      Result Value   B Natriuretic Peptide 452.3 (*)    All other components within normal limits  CBC WITH DIFFERENTIAL/PLATELET - Abnormal; Notable for the following components:   RBC 3.95 (*)    Hemoglobin 9.4 (*)    HCT 29.3 (*)    MCV 74.2 (*)    MCH 23.8 (*)    RDW 18.6 (*)    Platelets 126 (*)    Lymphs Abs 0.5 (*)    All other components within normal limits  COMPREHENSIVE METABOLIC PANEL - Abnormal; Notable for the following components:   Sodium 132 (*)    Chloride 95 (*)    Glucose,  Bld 101 (*)    BUN 24 (*)    Creatinine, Ser 2.17 (*)    GFR, Estimated 33 (*)    All other components within normal limits  RESP PANEL BY RT-PCR (RSV, FLU A&B, COVID)  [id number]    EKG EKG Interpretation  Date/Time:  Monday August 05 2022 12:01:03 EDT Ventricular Rate:  79 PR Interval:  188 QRS Duration: 91 QT Interval:  399  QTC Calculation: 458 R Axis:   67 Text Interpretation: Sinus rhythm Nonspecific T abnormalities, diffuse leads No significant change since last tracing Confirmed by Lorre Nick ([id number]) on 08/05/2022 12:25:41 PM  Radiology DG Chest 2 View  Result Date: 08/05/2022 CLINICAL DATA:  Shortness of breath, history of congestive heart failure EXAM: CHEST - 2 VIEW COMPARISON:  Chest x-ray July 31, 2022 FINDINGS: The cardiomediastinal silhouette is unchanged in contour. Mild bilateral interstitial pulmonary opacities. No pleural effusion or pneumothorax. The visualized upper abdomen is unremarkable. No acute osseous abnormality. IMPRESSION: Mild bilateral interstitial pulmonary opacities, likely edema. Electronically Signed   By: Jacob Moores M.D.   On: 08/05/2022 13:03    Procedures Procedures    Medications Ordered in ED Medications - No data to display  ED Course/ Medical Decision Making/ A&P Clinical Course as of 08/05/22 1646  Mon Aug 05, 2022  1641 Spoke with hospitalist Dr. Ophelia Charter.  She will admit for gentle diuresis, possible paracentesis [AS]    Clinical Course User Index [AS] Andersyn Fragoso, Edsel Petrin, PA-C                             Medical Decision Making This patient presents to the ED for concern of shortness of breath, lower extremity edema, this involves an extensive number of treatment options, and is a complaint that carries with it a high risk of complications and morbidity.  The emergent differential diagnosis for shortness of breath includes, but is not limited to, Pulmonary edema, bronchoconstriction, Pneumonia, Pulmonary embolism, Pneumotherax/  Hemothorax, Dysrythmia, ACS.    Co morbidities that complicate the patient evaluation  including but not limited to hypertension, polysubstance abuse, CKD, CAD, PAD, asthma, CHF with an EF of 55 to 60% on 08/01/2022  My initial workup includes labs, EKG, imaging  Additional history obtained from: Nursing notes from this visit. Previous records within EMR system ED visit for similar on 07/31/2022 resulting in admission EMS provided portion of the history  I ordered, reviewed and interpreted labs which include: CMP, CBC, BNP, respiratory panel.  Elevated BNP of 452.  Stable microcytic anemia with a hemoglobin of 9.4.  Hyponatremia of 132, BUN elevated at 24 with a creatinine of 2.17  I ordered imaging studies including two-view chest x-ray I independently visualized and interpreted imaging which showed continued pulmonary edema I agree with the radiologist interpretation  Consultations Obtained:  I requested consultation with the hospitalist Dr. Ophelia Charter,  and discussed lab and imaging findings as well as pertinent plan - they recommend: Admission, gentle diuresis, possible paracentesis  Afebrile, hemodynamically stable.  67 year old male presenting to the ED for evaluation of worsening lower extremity and abdominal swelling as well as shortness of breath.  On exam his abdomen is swollen and tympanic, however there is no significant abdominal TTP.  There is 2+ pitting edema to the level of the knees with wounds to bilateral feet that were present on his recent admission.  Lab workup significant for above.  His BNP continues to be elevated and his chest x-ray continues to show pulmonary edema.  Unfortunately he does have an elevated creatinine today.  Believe he would likely benefit from gentle diuresis and continued monitoring of his kidney function in order to prevent worsening of this.  May also benefit from paracentesis as his abdomen does show signs of ascites.  Doubt SBP at this time given  reassuring lab values, lack of significant abdominal pain, and lack of fever.  He will be admitted for continued management of his symptoms.  Stable at the time of admission.  Patient's case discussed with Dr. Freida Busman who agrees with plan to admit for further treatment.   Note: Portions of this report may have been transcribed using voice recognition software. Every effort was made to ensure accuracy; however, inadvertent computerized transcription errors may still be present.        Final Clinical Impression(s) / ED Diagnoses Final diagnoses:  Acute on chronic congestive heart failure, unspecified heart failure type  Ascites due to alcoholic cirrhosis    Rx / DC Orders ED Discharge Orders     None         Michelle Piper, Cordelia Poche 08/05/22 1646    Lorre Nick, MD 08/07/22 [phone]

## 2022-08-05 NOTE — ED Provider Notes (Signed)
I provided a substantive portion of the care of this patient.  I personally made/approved the management plan for this patient and take responsibility for the patient management.  EKG Interpretation  Date/Time:  Monday August 05 2022 12:01:03 EDT Ventricular Rate:  79 PR Interval:  188 QRS Duration: 91 QT Interval:  399 QTC Calculation: 458 R Axis:   67 Text Interpretation: Sinus rhythm Nonspecific T abnormalities, diffuse leads No significant change since last tracing Confirmed by Lorre Nick ([id number]) on 08/05/2022 12:25:41 PM   This is a 67 year old male who presents with increased shortness of breath, abdominal ascension as well as lower extremity edema.  BNP is slightly elevated today from baseline.  Patient has some increased abdominal distention consistent with ascites.  He has no abdominal pain.  Low suspicion for peritonitis or SBP chest x-ray per my interpretation is does show some fluid retention.  EKG per interpretation shows no signs of ischemia.  Patient given IV Lasix here and will require admission for diuresis and/or paracentesis   Lorre Nick, MD 08/05/22 1541

## 2022-08-05 NOTE — H&P (Signed)
History and Physical    Patient: John [name] ZCK:[id number] DOB: [birth year] DOA: 08/05/2022 DOS: the patient was seen and examined on 08/05/2022 PCP: Angela Cox, MD (Inactive)  Patient coming from: Home; NOK: Luella Cook, [phone]   Chief Complaint: SOB  HPI: John [name] is a 67 y.o. male with medical history significant of chronic diastolic CHF; stage 3 CKD; chronic back pain; CAD; alcoholic cirrhosis, ascites; HTN; polysubstance abuse; and CVA presenting with SOB, ascites.   He was last admitted from 4/3-4 with acute on chronic CHF with AKI vs. New-onset CKD, likely associated with non-compliance.  He reports that he was doing well for 2 days after arriving home.  On 4/6, he started noticing recurrent abdominal distention, SOB, and LE edema.  His toes are weeping.  His HH aide came today and placed bandages across the dorsum of his feet.  No ETOH x 10 years but he did use cocaine as recently as yesterday and marijuana 2 days ago.     ER Course:  SOB, just discharged 2 days ago and recurrent symptoms yesterday.  LE edema, ascites.   Denies ETOH x 10 years.  No hypoxia.  BNP elevated.  CXR with some edema.  Likely to benefit from diuresis, creatinine remains elevated 2.3.  No paracentesis during last admission.     Review of Systems: As mentioned in the history of present illness. All other systems reviewed and are negative. Past Medical History:  Diagnosis Date   Allergy    Arthritis    oa back   Asthma    Cataract    both eyes   CHF (congestive heart failure) 2001   sees primary for   Chronic back pain    Chronic kidney disease    ckd 3   Cirrhosis    alcoholic with h/o varices, ascites   Coronary artery disease    nonobstrucrtibe   ETOH abuse    GERD (gastroesophageal reflux disease)    Gout    Hepatitis    hepatitic c 2018 24 week tx with ecuplipsa, no hepatitic c detected after tx   History of blood transfusion 8-9- yrs ago    Hypertension    Optic neuropathy, left    PAD (peripheral artery disease)    slight  primary manages   Polysubstance abuse    Renal cell carcinoma 2016, 2018 and 2022   left ablation   Seizures    age 42 none since   Sickle cell trait    Stroke 1998   "light no problems with"   Uses walker 12/04/2020   Wears dentures    Wears glasses    Past Surgical History:  Procedure Laterality Date   ARTHRODESIS METATARSALPHALANGEAL JOINT (MTPJ) Right 12/07/2020   Procedure: ARTHRODESIS METATARSALPHALANGEAL JOINT (MTPJ);  Surgeon: Felecia Shelling, DPM;  Location: [facility];  Service: Podiatry;  Laterality: Right;   COLONOSCOPY  2021   ESOPHAGOGASTRODUODENOSCOPY N/A 08/14/2014   Procedure: ESOPHAGOGASTRODUODENOSCOPY (EGD);  Surgeon: Meryl Dare, MD;  Location: [facility];  Service: Endoscopy;  Laterality: N/A;   ESOPHAGOGASTRODUODENOSCOPY (EGD) WITH PROPOFOL N/A 06/03/2017   Procedure: ESOPHAGOGASTRODUODENOSCOPY (EGD) WITH PROPOFOL;  Surgeon: Meryl Dare, MD;  Location: [facility];  Service: Endoscopy;  Laterality: N/A;   HALLUX FUSION Left 07/28/2020   Procedure: HALLUX FUSION MPJ;  Surgeon: Felecia Shelling, DPM;  Location: [facility];  Service: Podiatry;  Laterality: Left;   HAMMER TOE SURGERY Left 07/28/2020   Procedure: HAMMER TOE  CORRECTION  2,3,AND4 LEFT FOOT;  Surgeon: Felecia Shelling, DPM;  Location: [facility];  Service: Podiatry;  Laterality: Left;   HAMMER TOE SURGERY Right 12/07/2020   Procedure: HAMMER TOE CORRECTION  2-4;  Surgeon: Felecia Shelling, DPM;  Location: [facility];  Service: Podiatry;  Laterality: Right;   IR RADIOLOGIST EVAL & MGMT  09/25/2016   IR RADIOLOGIST EVAL & MGMT  12/10/2016   IR RADIOLOGIST EVAL & MGMT  03/25/2018   IR RADIOLOGIST EVAL & MGMT  03/24/2019   IR RADIOLOGIST EVAL & MGMT  05/17/2020   IR RADIOLOGIST EVAL & MGMT  08/03/2020   IR RADIOLOGIST EVAL & MGMT  11/22/2020   IR  RADIOLOGIST EVAL & MGMT  05/31/2021   METATARSAL HEAD EXCISION Left 07/28/2020   Procedure: METATARSAL HEAD EXCISION TOES 2,3,AND 4  LEFT FOOT;  Surgeon: Felecia Shelling, DPM;  Location: [facility];  Service: Podiatry;  Laterality: Left;   METATARSAL OSTEOTOMY Right 12/07/2020   Procedure: METATARSAL OSTEOTOMY TOES 2-4 RIGHT FOOT;  Surgeon: Felecia Shelling, DPM;  Location: [facility];  Service: Podiatry;  Laterality: Right;   none     RADIOFREQUENCY ABLATION Left 11/08/2016   Procedure: LEFT RENAL CRYOABLATION;  Surgeon: Berdine Dance, MD;  Location: [facility];  Service: Anesthesiology;  Laterality: Left;   RADIOLOGY WITH ANESTHESIA N/A 08/15/2014   Procedure: RADIOLOGY WITH ANESTHESIA;  Surgeon: Berdine Dance, MD;  Location: [facility];  Service: Radiology;  Laterality: N/A;   RADIOLOGY WITH ANESTHESIA Left 07/05/2020   Procedure: CT CRYOABLATION;  Surgeon: Berdine Dance, MD;  Location: [facility];  Service: Anesthesiology;  Laterality: Left;   UPPER GASTROINTESTINAL ENDOSCOPY     Social History:  reports that he quit smoking about 11 years ago. His smoking use included cigarettes. He has a 6.00 pack-year smoking history. He has never used smokeless tobacco. He reports current drug use. Drugs: Cocaine and Marijuana. He reports that he does not drink alcohol.  Allergies  Allergen Reactions   Penicillins Other (See Comments)    Convulsions, Patient could not walk, childhood allergy   Has patient had a PCN reaction causing immediate rash, facial/tongue/throat swelling, SOB or lightheadedness with hypotension: No Has patient had a PCN reaction causing severe rash involving mucus membranes or skin necrosis: No Has patient had a PCN reaction that required hospitalization No Has patient had a PCN reaction occurring within the last 10 years: No If all of the above answers are "NO", then may proceed with Cephalosporin use.    Family History  Problem Relation Age of Onset    Hypertension Mother        Living   Kidney disease Mother    Diabetes Mother    Heart disease Mother    Hypertension Father        Deceased, 63   Ulcers Father    Stomach cancer Father    Hypertension Brother    Diabetes Brother    Kidney disease Brother    Hypertension Sister    Colon cancer Neg Hx    Esophageal cancer Neg Hx    Rectal cancer Neg Hx     Prior to Admission medications   Medication Sig Start Date End Date Taking? Authorizing Provider  acetaminophen (TYLENOL) 325 MG tablet Take 650 mg by mouth every 8 (eight) hours as needed for moderate pain (NOT TO EXCEED 2,000 MG/DAY).    [provider]  allopurinol (ZYLOPRIM) 100 MG tablet Take 1 tablet (  100 mg total) by mouth daily. 05/12/18   [name], Daniel V, MD  atorvastatin (LIPITOR) 40 MG tablet Take 40 mg by mouth daily. 04/29/19   [provider]  brimonidine (ALPHAGAN) 0.15 % ophthalmic solution Place 1 drop into both eyes at bedtime.    [provider]  carvedilol (COREG) 3.125 MG tablet Take 1 tablet (3.125 mg total) by mouth 2 (two) times daily with a meal. 08/01/22   [name], [name], MD  ferrous sulfate 325 (65 FE) MG EC tablet Take 325 mg by mouth every Monday, Wednesday, and Friday.    [provider]  FLOVENT HFA 220 MCG/ACT inhaler Inhale 2 puffs into the lungs in the morning and at bedtime. 01/27/19   [provider]  furosemide (LASIX) 20 MG tablet Take 3 tablets (60 mg total) by mouth 2 (two) times daily. 08/01/22   [name], [name], MD  ketoconazole (NIZORAL) 2 % shampoo Apply 1 application  topically 2 (two) times a week.    [provider]  lactulose (CHRONULAC) 10 GM/15ML solution Take 20 g by mouth every other day. Patient not taking: Reported on 07/31/2022 11/01/16   [provider]  melatonin 5 MG TABS Take 5 mg by mouth at bedtime.    [provider]  Menthol-Methyl Salicylate (SALONPAS PAIN RELIEF PATCH) PTCH Apply 1 patch  topically daily as needed (for pain- affected area).    [provider]  omeprazole (PRILOSEC) 40 MG capsule TAKE ONE CAPSULE BY MOUTH DAILY Patient taking differently: Take 40 mg by mouth daily before breakfast. 07/02/18   [name], Malcolm T, MD  potassium chloride (KLOR-CON) 10 MEQ tablet Take 10 mEq by mouth daily.    [provider]  PROAIR HFA 108 [phone](90 Base) MCG/ACT inhaler Inhale 2 puffs into the lungs every 6 (six) hours as needed for wheezing or shortness of breath. 10/02/17   [provider]  spironolactone (ALDACTONE) 50 MG tablet Take 1 tablet (50 mg total) by mouth daily. 08/01/22   [name], [name], MD  terbinafine (LAMISIL) 1 % cream Apply 1 Application topically See admin instructions. Apply to feet as directed every other day    [provider]    Physical Exam: Vitals:   08/05/22 1500 08/05/22 1517 08/05/22 1600 08/05/22 1606  BP: 113/81  117/87   Pulse: 79  76   Resp: (!) 22  18   Temp:    98.8 F (37.1 C)  TempSrc:      SpO2: 94% 98% 95%    General:  Appears somewhat disheveled Eyes:   EOMI, normal lids, iris ENT:  grossly normal hearing, lips & tongue, mmm; artificial dentition Neck:  no LAD, masses or thyromegaly Cardiovascular:  RRR, no m/r/g. 1-2+ LE edema with mostly pedal edema.  Respiratory:   CTA bilaterally with no wheezes/rales/rhonchi.  Normal respiratory effort. Abdomen:  taut distention with fluid wave, mildly diffuse TTP Skin:  weeping stasis ulcers along the dorsal toes B, mostly the 2nd and 3rd toes; poor chronic foot hygiene      Musculoskeletal:  grossly normal tone BUE/BLE, no bony abnormality Psychiatric:  blunted mood and affect, speech fluent and appropriate, AOx3 Neurologic:  CN 2-12 grossly intact, moves all extremities in coordinated fashion   Radiological Exams on Admission: Independently reviewed - see discussion in A/P where applicable  DG Chest 2 View  Result Date: 08/05/2022 CLINICAL DATA:   Shortness of breath, history of congestive heart failure EXAM: CHEST - 2 VIEW COMPARISON:  Chest  x-ray July 31, 2022 FINDINGS: The cardiomediastinal silhouette is unchanged in contour. Mild bilateral interstitial pulmonary opacities. No pleural effusion or pneumothorax. The visualized upper abdomen is unremarkable. No acute osseous abnormality. IMPRESSION: Mild bilateral interstitial pulmonary opacities, likely edema. Electronically Signed   By: Jacob Moores M.D.   On: 08/05/2022 13:03    EKG: Independently reviewed.  NSR with rate 79; nonspecific ST changes with no evidence of acute ischemia   Labs on Admission: I have personally reviewed the available labs and imaging studies at the time of the admission.  Pertinent labs:    Na++ 132 BUN 24/Creatinine 2.17/GFR 33 - stable WBC 5.3 Hgb 9.4 - stable Platelets 126 BNP 452.3 COVID/flu/RSV negative   Assessment and Plan: Principal Problem:   Ascites due to alcoholic cirrhosis (HCC) Active Problems:   Essential hypertension, benign   Polysubstance abuse   Varices, gastric   Diastolic dysfunction   Stasis dermatitis of both legs   Glaucoma (increased eye pressure)   COPD (chronic obstructive pulmonary disease)    Alcoholic cirrhosis with ascites -Patient with recent admission for CHF exacerbation presenting with SOB, LE edema, and ascites -His primary issue currently appears to be related to the ascites and all issues are likely to improve with paracentesis -This may have been exacerbated by cocaine/THC use -Renal function appears to be stable -BNP is actually better than prior -Will continue home Lasix -US paracentesis ordered -Will observe overnight on telemetry -If platelets and INR are normal, the patient is ruled out for decompensated cirrhosis; platelets are low and INR was not performed (ordered for tomorrow) but he appears to have ruled in. -EGD showed grade 1 varices in 06/2020; he is due for repeat EGD at this time.   If no bleeding, consider outpatient GI referral. -MELD score is 16, with a mortality rate of 6% -He is not a candidate for transplantation at this time given polysubstance abuse -Child-Pugh category is B, with an 80% 1-year survival -He does report prior h/o paracentesis but none recently, not done during last admission -Recommend 2 gram sodium restricted diet with nutritional education; consult requested -Continue spironolactone and Lasix   Stasis wounds -LE edema has led to weeping wounds, particularly on toes -Wound consult requested  Chronic diastolic CHF -Last hospitalization, CHF was the presumed diagnosis -4/4 echo reassuring -While this may have been the case, decompensated cirrhosis can mimic the symptoms of CHF as well -Appears compensated from a heart failure standpoint currently  Stage 3b CKD -Likely progressive due to medication noncompliance and ongoing polysubstance abuse -Needs avoidance of nephrotoxic medications -Appears to be stable at this time  HTN -continue carvedilol  HLD -Continue Lipitor  Polysubstance abuse -Cocaine and THC cessation encouraged -UDS ordered   Glaucoma -Continue brimonidine -Left eye vision loss chronically  COPD -Continues to smoke tobacco but not heavily -Continue Flovent    Advance Care Planning:   Code Status: Full Code - Code status was discussed with the patient and/or family at the time of admission.  The patient would want to receive full resuscitative measures at this time.   Consults: TOC team, wound care, nutrition  DVT Prophylaxis: SCDs  Family Communication: None present  Severity of Illness: The appropriate patient status for this patient is OBSERVATION. Observation status is judged to be reasonable and necessary in order to provide the required intensity of service to ensure the patient's safety. The patient's presenting symptoms, physical exam findings, and initial radiographic and laboratory data in the  context of their medical  condition is felt to place them at decreased risk for further clinical deterioration. Furthermore, it is anticipated that the patient will be medically stable for discharge from the hospital within 2 midnights of admission.   Author: Jonah Blue, MD 08/05/2022 6:41 PM  For on call review [email or web address].

## 2022-08-05 NOTE — ED Triage Notes (Signed)
EMS reports from home.  Fluid retentions SOB, abdomen distended, lower bilateral edema,  Hx of chf , patient states compliant with meds Pt was here last week for same.  BP 112/76 HR 80 RR 18  SPO2 92 RA SPO2 99 at 2 Lts nasal canula  CBG 124

## 2022-08-05 NOTE — ED Notes (Signed)
ED TO INPATIENT HANDOFF REPORT  ED Nurse Name and Phone #: Joanna Hews Name/Age/Gender John [name] 67 y.o. male Room/Bed: WA01/WA01  Code Status   Code Status: Full Code  Home/SNF/Other Home Patient oriented to: self Is this baseline? Yes   Triage Complete: Triage complete  Chief Complaint Ascites due to alcoholic cirrhosis [K70.31]  Triage Note EMS reports from home.  Fluid retentions SOB, abdomen distended, lower bilateral edema,  Hx of chf , patient states compliant with meds Pt was here last week for same.  BP 112/76 HR 80 RR 18  SPO2 92 RA SPO2 99 at 2 Lts nasal canula  CBG 124   Allergies Allergies  Allergen Reactions   Penicillins Other (See Comments)    Convulsions, Patient could not walk, childhood allergy   Has patient had a PCN reaction causing immediate rash, facial/tongue/throat swelling, SOB or lightheadedness with hypotension: No Has patient had a PCN reaction causing severe rash involving mucus membranes or skin necrosis: No Has patient had a PCN reaction that required hospitalization No Has patient had a PCN reaction occurring within the last 10 years: No If all of the above answers are "NO", then may proceed with Cephalosporin use.    Level of Care/Admitting Diagnosis ED Disposition     ED Disposition  Admit   Condition  --   Comment  Hospital Area: [facility] [[id number]]  Level of Care: Telemetry [5]  Admit to tele based on following criteria: Monitor for Ischemic changes  May place patient in observation at [facility] or [facility] if equivalent level of care is available:: Yes  Covid Evaluation: Asymptomatic - no recent exposure (last 10 days) testing not required  Diagnosis: Ascites due to alcoholic cirrhosis [[id number]]  Admitting Physician: Jonah Blue [[id number]]  Attending Physician: Jonah Blue [[id number]]          B Medical/Surgery History Past Medical History:  Diagnosis Date   Allergy     Arthritis    oa back   Asthma    Cataract    both eyes   CHF (congestive heart failure) 2001   sees primary for   Chronic back pain    Chronic kidney disease    ckd 3   Cirrhosis    alcoholic with h/o varices, ascites   Coronary artery disease    nonobstrucrtibe   ETOH abuse    GERD (gastroesophageal reflux disease)    Gout    Hepatitis    hepatitic c 2018 24 week tx with ecuplipsa, no hepatitic c detected after tx   History of blood transfusion 8-9- yrs ago   Hypertension    Optic neuropathy, left    PAD (peripheral artery disease)    slight  primary manages   Polysubstance abuse    Renal cell carcinoma 2016, 2018 and 2022   left ablation   Seizures    age 26 none since   Sickle cell trait    Stroke 1998   "light no problems with"   Uses walker 12/04/2020   Wears dentures    Wears glasses    Past Surgical History:  Procedure Laterality Date   ARTHRODESIS METATARSALPHALANGEAL JOINT (MTPJ) Right 12/07/2020   Procedure: ARTHRODESIS METATARSALPHALANGEAL JOINT (MTPJ);  Surgeon: Felecia Shelling, DPM;  Location: [facility];  Service: Podiatry;  Laterality: Right;   COLONOSCOPY  2021   ESOPHAGOGASTRODUODENOSCOPY N/A 08/14/2014   Procedure: ESOPHAGOGASTRODUODENOSCOPY (EGD);  Surgeon: Meryl Dare, MD;  Location: [facility];  Service: Endoscopy;  Laterality: N/A;   ESOPHAGOGASTRODUODENOSCOPY (EGD) WITH PROPOFOL N/A 06/03/2017   Procedure: ESOPHAGOGASTRODUODENOSCOPY (EGD) WITH PROPOFOL;  Surgeon: Meryl Dare, MD;  Location: [facility];  Service: Endoscopy;  Laterality: N/A;   HALLUX FUSION Left 07/28/2020   Procedure: HALLUX FUSION MPJ;  Surgeon: Felecia Shelling, DPM;  Location: [facility];  Service: Podiatry;  Laterality: Left;   HAMMER TOE SURGERY Left 07/28/2020   Procedure: HAMMER TOE CORRECTION  2,3,AND4 LEFT FOOT;  Surgeon: Felecia Shelling, DPM;  Location: [facility];  Service: Podiatry;  Laterality: Left;   HAMMER  TOE SURGERY Right 12/07/2020   Procedure: HAMMER TOE CORRECTION  2-4;  Surgeon: Felecia Shelling, DPM;  Location: [facility] ;  Service: Podiatry;  Laterality: Right;   IR RADIOLOGIST EVAL & MGMT  09/25/2016   IR RADIOLOGIST EVAL & MGMT  12/10/2016   IR RADIOLOGIST EVAL & MGMT  03/25/2018   IR RADIOLOGIST EVAL & MGMT  03/24/2019   IR RADIOLOGIST EVAL & MGMT  05/17/2020   IR RADIOLOGIST EVAL & MGMT  08/03/2020   IR RADIOLOGIST EVAL & MGMT  11/22/2020   IR RADIOLOGIST EVAL & MGMT  05/31/2021   METATARSAL HEAD EXCISION Left 07/28/2020   Procedure: METATARSAL HEAD EXCISION TOES 2,3,AND 4  LEFT FOOT;  Surgeon: Felecia Shelling, DPM;  Location: [facility];  Service: Podiatry;  Laterality: Left;   METATARSAL OSTEOTOMY Right 12/07/2020   Procedure: METATARSAL OSTEOTOMY TOES 2-4 RIGHT FOOT;  Surgeon: Felecia Shelling, DPM;  Location: [facility];  Service: Podiatry;  Laterality: Right;   none     RADIOFREQUENCY ABLATION Left 11/08/2016   Procedure: LEFT RENAL CRYOABLATION;  Surgeon: Berdine Dance, MD;  Location: [facility];  Service: Anesthesiology;  Laterality: Left;   RADIOLOGY WITH ANESTHESIA N/A 08/15/2014   Procedure: RADIOLOGY WITH ANESTHESIA;  Surgeon: Berdine Dance, MD;  Location: [facility];  Service: Radiology;  Laterality: N/A;   RADIOLOGY WITH ANESTHESIA Left 07/05/2020   Procedure: CT CRYOABLATION;  Surgeon: Berdine Dance, MD;  Location: [facility];  Service: Anesthesiology;  Laterality: Left;   UPPER GASTROINTESTINAL ENDOSCOPY       A IV Location/Drains/Wounds Patient Lines/Drains/Airways Status     Active Line/Drains/Airways     Name Placement date Placement time Site Days   Peripheral IV 08/05/22 20 G Anterior;Distal;Right;Upper Arm 08/05/22  1325  Arm  less than 1   Wound / Incision (Open or Dehisced) 07/31/22 Non-pressure wound;Irritant Dermatitis (Moisture Associated Skin Damage) Toe (Comment  which one) Anterior;Right MASD between first two toes  07/31/22  1630  Toe (Comment  which one)  5   Wound / Incision (Open or Dehisced) 07/31/22 Non-pressure wound Toe (Comment  which one) Anterior;Left;Upper open wound 07/31/22  1630  Toe (Comment  which one)  5            Intake/Output Last 24 hours No intake or output data in the 24 hours ending 08/05/22 1739  Labs/Imaging Results for orders placed or performed during the hospital encounter of 08/05/22 (from the past 48 hour(s))  Brain natriuretic peptide     Status: Abnormal   Collection Time: 08/05/22  1:05 PM  Result Value Ref Range   B Natriuretic Peptide 452.3 (H) 0.0 - 100.0 pg/mL    Comment: Performed at [facility], [address], [place], [place] [address]  CBC with Differential     Status: Abnormal   Collection Time: 08/05/22  1:05 PM  Result Value Ref Range   WBC 5.3 4.0 - 10.5 K/uL   RBC 3.95 (L) 4.22 - 5.81 MIL/uL   Hemoglobin 9.4 (L) 13.0 - 17.0 g/dL   HCT 16.129.3 (L) 09.639.0 - 04.552.0 %   MCV 74.2 (L) 80.0 - 100.0 fL   MCH 23.8 (L) 26.0 - 34.0 pg   MCHC 32.1 30.0 - 36.0 g/dL   RDW 40.918.6 (H) 81.111.5 - 91.415.5 %   Platelets 126 (L) 150 - 400 K/uL   nRBC 0.0 0.0 - 0.2 %   Neutrophils Relative % 76 %   Neutro Abs 4.0 1.7 - 7.7 K/uL   Lymphocytes Relative 9 %   Lymphs Abs 0.5 (L) 0.7 - 4.0 K/uL   Monocytes Relative 14 %   Monocytes Absolute 0.7 0.1 - 1.0 K/uL   Eosinophils Relative 1 %   Eosinophils Absolute 0.1 0.0 - 0.5 K/uL   Basophils Relative 0 %   Basophils Absolute 0.0 0.0 - 0.1 K/uL   Immature Granulocytes 0 %   Abs Immature Granulocytes 0.01 0.00 - 0.07 K/uL    Comment: Performed at [facility], [address] [place], [place] [address]  Comprehensive metabolic panel     Status: Abnormal   Collection Time: 08/05/22  1:05 PM  Result Value Ref Range   Sodium 132 (L) 135 - 145 mmol/L   Potassium 4.1 3.5 - 5.1 mmol/L   Chloride 95 (L) 98 - 111 mmol/L   CO2 26 22 - 32 mmol/L   Glucose, Bld 101 (H) 70 - 99 mg/dL    Comment:  Glucose reference range applies only to samples taken after fasting for at least 8 hours.   BUN 24 (H) 8 - 23 mg/dL   Creatinine, Ser 6.212.17 (H) 0.61 - 1.24 mg/dL   Calcium 9.1 8.9 - 30.810.3 mg/dL   Total Protein 7.8 6.5 - 8.1 g/dL   Albumin 4.1 3.5 - 5.0 g/dL   AST 18 15 - 41 U/L   ALT 8 0 - 44 U/L   Alkaline Phosphatase 57 38 - 126 U/L   Total Bilirubin 0.9 0.3 - 1.2 mg/dL   GFR, Estimated 33 (L) >60 mL/min    Comment: (NOTE) Calculated using the CKD-EPI Creatinine Equation (2021)    Anion gap 11 5 - 15    Comment: Performed at [facility], [address] [place], [place] [address]  Resp panel by RT-PCR (RSV, Flu A&B, Covid) Anterior Nasal Swab     Status: None   Collection Time: 08/05/22  1:35 PM   Specimen: Anterior Nasal Swab  Result Value Ref Range   SARS Coronavirus 2 by RT PCR NEGATIVE NEGATIVE    Comment: (NOTE) SARS-CoV-2 target nucleic acids are NOT DETECTED.  The SARS-CoV-2 RNA is generally detectable in upper respiratory specimens during the acute phase of infection. The lowest concentration of SARS-CoV-2 viral copies this assay can detect is 138 copies/mL. A negative result does not preclude SARS-Cov-2 infection and should not be used as the sole basis for treatment or other patient management decisions. A negative result may occur with  improper specimen collection/handling, submission of specimen other than nasopharyngeal swab, presence of viral mutation(s) within the areas targeted by this assay, and inadequate number of viral copies(<138 copies/mL). A negative result must be combined with clinical observations, patient history, and epidemiological information. The expected result is Negative.  Fact Sheet for Patients:  BloggerCourse.comhttps://www.fda.gov/media/152166/download  Fact Sheet for Healthcare Providers:  SeriousBroker.ithttps://www.fda.gov/media/152162/download  This test is no  t yet approved or cleared by the [place] and  has been authorized for  detection and/or diagnosis of SARS-CoV-2 by FDA under an Emergency Use Authorization (EUA). This EUA will remain  in effect (meaning this test can be used) for the duration of the COVID-19 declaration under Section 564(b)(1) of the Act, 21 U.S.C.section 360bbb-3(b)(1), unless the authorization is terminated  or revoked sooner.       Influenza A by PCR NEGATIVE NEGATIVE   Influenza B by PCR NEGATIVE NEGATIVE    Comment: (NOTE) The Xpert Xpress SARS-CoV-2/FLU/RSV plus assay is intended as an aid in the diagnosis of influenza from Nasopharyngeal swab specimens and should not be used as a sole basis for treatment. Nasal washings and aspirates are unacceptable for Xpert Xpress SARS-CoV-2/FLU/RSV testing.  Fact Sheet for Patients: BloggerCourse.com  Fact Sheet for Healthcare Providers: SeriousBroker.it  This test is not yet approved or cleared by the [place] FDA and has been authorized for detection and/or diagnosis of SARS-CoV-2 by FDA under an Emergency Use Authorization (EUA). This EUA will remain in effect (meaning this test can be used) for the duration of the COVID-19 declaration under Section 564(b)(1) of the Act, 21 U.S.C. section 360bbb-3(b)(1), unless the authorization is terminated or revoked.     Resp Syncytial Virus by PCR NEGATIVE NEGATIVE    Comment: (NOTE) Fact Sheet for Patients: BloggerCourse.com  Fact Sheet for Healthcare Providers: SeriousBroker.it  This test is not yet approved or cleared by the [place] FDA and has been authorized for detection and/or diagnosis of SARS-CoV-2 by FDA under an Emergency Use Authorization (EUA). This EUA will remain in effect (meaning this test can be used) for the duration of the COVID-19 declaration under Section 564(b)(1) of the Act, 21 U.S.C. section 360bbb-3(b)(1), unless the authorization is terminated  or revoked.  Performed at [facility], [address] [place], [place] [address]    DG Chest 2 View  Result Date: 08/05/2022 CLINICAL DATA:  Shortness of breath, history of congestive heart failure EXAM: CHEST - 2 VIEW COMPARISON:  Chest x-ray July 31, 2022 FINDINGS: The cardiomediastinal silhouette is unchanged in contour. Mild bilateral interstitial pulmonary opacities. No pleural effusion or pneumothorax. The visualized upper abdomen is unremarkable. No acute osseous abnormality. IMPRESSION: Mild bilateral interstitial pulmonary opacities, likely edema. Electronically Signed   By: Jacob Moores M.D.   On: 08/05/2022 13:03    Pending Labs Unresulted Labs (From admission, onward)     Start     Ordered   08/06/22 0500  Comprehensive metabolic panel  Tomorrow morning,   R        08/05/22 1721   08/06/22 0500  CBC  Tomorrow morning,   R        08/05/22 1721   08/06/22 0500  Protime-INR  Tomorrow morning,   R        08/05/22 1721   08/05/22 1722  Rapid urine drug screen (hospital performed)  ONCE - STAT,   STAT        08/05/22 1721            Vitals/Pain Today's Vitals   08/05/22 1500 08/05/22 1517 08/05/22 1600 08/05/22 1606  BP: 113/81  117/87   Pulse: 79  76   Resp: (!) 22  18   Temp:    98.8 F (37.1 C)  TempSrc:      SpO2: 94% 98% 95%   PainSc:        Isolation Precautions No active  isolations  Medications Medications  allopurinol (ZYLOPRIM) tablet 100 mg (has no administration in time range)  atorvastatin (LIPITOR) tablet 40 mg (has no administration in time range)  carvedilol (COREG) tablet 3.125 mg (has no administration in time range)  furosemide (LASIX) tablet 60 mg (has no administration in time range)  spironolactone (ALDACTONE) tablet 50 mg (has no administration in time range)  pantoprazole (PROTONIX) EC tablet 40 mg (has no administration in time range)  melatonin tablet 5 mg (has no administration in time range)  fluticasone  (FLOVENT HFA) 220 MCG/ACT inhaler 2 puff (has no administration in time range)  albuterol (VENTOLIN HFA) 108 (90 Base) MCG/ACT inhaler 2 puff (has no administration in time range)  brimonidine (ALPHAGAN) 0.15 % ophthalmic solution 1 drop (has no administration in time range)  oxyCODONE (Oxy IR/ROXICODONE) immediate release tablet 5 mg (has no administration in time range)  docusate sodium (COLACE) capsule 100 mg (has no administration in time range)  polyethylene glycol (MIRALAX / GLYCOLAX) packet 17 g (has no administration in time range)  bisacodyl (DULCOLAX) EC tablet 5 mg (has no administration in time range)  ondansetron (ZOFRAN) tablet 4 mg (has no administration in time range)    Or  ondansetron (ZOFRAN) injection 4 mg (has no administration in time range)  hydrALAZINE (APRESOLINE) injection 5 mg (has no administration in time range)    Mobility walks     Focused Assessments Pulmonary Assessment Handoff:  Lung sounds:   O2 Device: Room Air      R Recommendations: See Admitting Provider Note  Report given to:   Additional Notes: .

## 2022-08-06 ENCOUNTER — Encounter (HOSPITAL_COMMUNITY): Payer: Self-pay | Admitting: *Deleted

## 2022-08-06 ENCOUNTER — Observation Stay (HOSPITAL_COMMUNITY): Payer: Medicare (Managed Care)

## 2022-08-06 DIAGNOSIS — J449 Chronic obstructive pulmonary disease, unspecified: Secondary | ICD-10-CM

## 2022-08-06 DIAGNOSIS — K7031 Alcoholic cirrhosis of liver with ascites: Secondary | ICD-10-CM | POA: Diagnosis not present

## 2022-08-06 LAB — COMPREHENSIVE METABOLIC PANEL WITH GFR
ALT: 9 U/L (ref 0–44)
AST: 15 U/L (ref 15–41)
Albumin: 3.8 g/dL (ref 3.5–5.0)
Alkaline Phosphatase: 56 U/L (ref 38–126)
Anion gap: 11 (ref 5–15)
BUN: 27 mg/dL — ABNORMAL HIGH (ref 8–23)
CO2: 26 mmol/L (ref 22–32)
Calcium: 9.1 mg/dL (ref 8.9–10.3)
Chloride: 98 mmol/L (ref 98–111)
Creatinine, Ser: 2.16 mg/dL — ABNORMAL HIGH (ref 0.61–1.24)
GFR, Estimated: 33 mL/min — ABNORMAL LOW
Glucose, Bld: 97 mg/dL (ref 70–99)
Potassium: 4.4 mmol/L (ref 3.5–5.1)
Sodium: 135 mmol/L (ref 135–145)
Total Bilirubin: 1.1 mg/dL (ref 0.3–1.2)
Total Protein: 7.3 g/dL (ref 6.5–8.1)

## 2022-08-06 LAB — BODY FLUID CULTURE W GRAM STAIN

## 2022-08-06 LAB — CBC
HCT: 27.8 % — ABNORMAL LOW (ref 39.0–52.0)
Hemoglobin: 8.9 g/dL — ABNORMAL LOW (ref 13.0–17.0)
MCH: 24 pg — ABNORMAL LOW (ref 26.0–34.0)
MCHC: 32 g/dL (ref 30.0–36.0)
MCV: 74.9 fL — ABNORMAL LOW (ref 80.0–100.0)
Platelets: 122 10*3/uL — ABNORMAL LOW (ref 150–400)
RBC: 3.71 MIL/uL — ABNORMAL LOW (ref 4.22–5.81)
RDW: 18.7 % — ABNORMAL HIGH (ref 11.5–15.5)
WBC: 5.4 10*3/uL (ref 4.0–10.5)
nRBC: 0 % (ref 0.0–0.2)

## 2022-08-06 LAB — BODY FLUID CELL COUNT WITH DIFFERENTIAL
Lymphs, Fluid: 34 %
Monocyte-Macrophage-Serous Fluid: 49 % — ABNORMAL LOW (ref 50–90)
Neutrophil Count, Fluid: 17 % (ref 0–25)
Total Nucleated Cell Count, Fluid: 484 cu mm (ref 0–1000)

## 2022-08-06 LAB — LACTATE DEHYDROGENASE, PLEURAL OR PERITONEAL FLUID: LD, Fluid: 74 U/L — ABNORMAL HIGH (ref 3–23)

## 2022-08-06 LAB — PROTIME-INR
INR: 1.3 — ABNORMAL HIGH (ref 0.8–1.2)
Prothrombin Time: 16 seconds — ABNORMAL HIGH (ref 11.4–15.2)

## 2022-08-06 LAB — ALBUMIN, PLEURAL OR PERITONEAL FLUID: Albumin, Fluid: 2.5 g/dL

## 2022-08-06 MED ORDER — LIDOCAINE HCL 1 % IJ SOLN
INTRAMUSCULAR | Status: AC
  Filled 2022-08-06: qty 20

## 2022-08-06 NOTE — Discharge Summary (Signed)
Physician Discharge Summary   John [name]:[id number] DOB: [birth year] DOA: 08/05/2022  PCP: Angela Cox, MD (Inactive)  Admit date: 08/05/2022 Discharge date: 08/06/2022  Admitted From: Home Disposition:  Home Discharging physician: Lewie Chamber, MD Barriers to discharge: none  Recommendations for Outpatient Follow-up:  Continue wound care   Discharge Condition: stable CODE STATUS: Full Diet recommendation:  Diet Orders (From admission, onward)     Start     Ordered   08/06/22 1143  Diet 2 gram sodium Room service appropriate? Yes; Fluid consistency: Thin  Diet effective now       Question Answer Comment  Room service appropriate? Yes   Fluid consistency: Thin      08/06/22 1143   08/06/22 0000  Diet - low sodium heart healthy        08/06/22 1346            Hospital Course: Alcoholic cirrhosis with ascites -Patient with recent admission for CHF exacerbation presenting with SOB, LE edema, and ascites -His primary issue currently appears to be related to the ascites and all issues are likely to improve with paracentesis -This may have been exacerbated by cocaine/THC use -Renal function appears to be stable -BNP is actually better than prior -Will continue home Lasix -Paracentesis performed on 08/06/2022 removing 1.75 L and patient felt better after this - given improvement, patient felt to be stable for d/c home - continue home regimen   Stasis wounds -LE edema has led to weeping wounds, particularly on toes -Evaluated by wound care - Ongoing wound care discharge.  Patient already follows   Chronic diastolic CHF -Last hospitalization, CHF was the presumed diagnosis -4/4 echo reassuring -While this may have been the case, decompensated cirrhosis can mimic the symptoms of CHF as well -Appears compensated from a heart failure standpoint currently   Stage 3b CKD -Likely progressive due to medication noncompliance and ongoing polysubstance  abuse -Needs avoidance of nephrotoxic medications -Appears to be stable at this time   HTN -continue carvedilol   HLD -Continue Lipitor   Polysubstance abuse -Cocaine and THC cessation encouraged -UDS ordered    Glaucoma -Continue brimonidine -Left eye vision loss chronically   COPD -Continues to smoke tobacco but not heavily -Continue Flovent   The patient's chronic medical conditions were treated accordingly per the patient's home medication regimen except as noted.  On day of discharge, patient was felt deemed stable for discharge. Patient/family member advised to call PCP or come back to ER if needed.   Principal Diagnosis: Ascites due to alcoholic cirrhosis  Discharge Diagnoses: Active Hospital Problems   Diagnosis Date Noted   Ascites due to alcoholic cirrhosis (HCC)    Stasis dermatitis of both legs 08/05/2022   Glaucoma (increased eye pressure) 08/05/2022   COPD (chronic obstructive pulmonary disease) 08/05/2022   Diastolic dysfunction    Varices, gastric    Polysubstance abuse 06/24/2014   Essential hypertension, benign 12/29/2012    Resolved Hospital Problems  No resolved problems to display.     Discharge Instructions     Diet - low sodium heart healthy   Complete by: As directed    Discharge wound care:   Complete by: As directed    Cleanse between all toes, dry well Weave silver hydrofiber Aquacel Ag between the affected toes and cover with silver affected areas of the dorsal foot. Top with conform or kerlix. Change every other day   Increase activity slowly   Complete by: As directed  Allergies as of 08/06/2022       Reactions   Penicillins Other (See Comments)   Convulsions, Patient could not walk, childhood allergy  Has patient had a PCN reaction causing immediate rash, facial/tongue/throat swelling, SOB or lightheadedness with hypotension: No Has patient had a PCN reaction causing severe rash involving mucus membranes or skin necrosis:  No Has patient had a PCN reaction that required hospitalization No Has patient had a PCN reaction occurring within the last 10 years: No If all of the above answers are "NO", then may proceed with Cephalosporin use.        Medication List     TAKE these medications    acetaminophen 325 MG tablet Commonly known as: TYLENOL Take 650 mg by mouth every 8 (eight) hours as needed for moderate pain (NOT TO EXCEED 2,000 MG/DAY).   allopurinol 100 MG tablet Commonly known as: ZYLOPRIM Take 1 tablet (100 mg total) by mouth daily.   atorvastatin 40 MG tablet Commonly known as: LIPITOR Take 40 mg by mouth daily.   brimonidine 0.15 % ophthalmic solution Commonly known as: ALPHAGAN Place 1 drop into both eyes at bedtime.   carvedilol 3.125 MG tablet Commonly known as: COREG Take 1 tablet (3.125 mg total) by mouth 2 (two) times daily with a meal.   ferrous sulfate 325 (65 FE) MG EC tablet Take 325 mg by mouth every Monday, Wednesday, and Friday.   Flovent HFA 220 MCG/ACT inhaler Generic drug: fluticasone Inhale 2 puffs into the lungs in the morning and at bedtime.   furosemide 20 MG tablet Commonly known as: LASIX Take 3 tablets (60 mg total) by mouth 2 (two) times daily.   ketoconazole 2 % shampoo Commonly known as: NIZORAL Apply 1 application  topically 2 (two) times a week.   melatonin 5 MG Tabs Take 5 mg by mouth at bedtime.   omeprazole 40 MG capsule Commonly known as: PRILOSEC TAKE ONE CAPSULE BY MOUTH DAILY What changed: when to take this   potassium chloride 10 MEQ tablet Commonly known as: KLOR-CON Take 10 mEq by mouth daily.   ProAir HFA 108 (90 Base) MCG/ACT inhaler Generic drug: albuterol Inhale 2 puffs into the lungs every 6 (six) hours as needed for wheezing or shortness of breath.   Salonpas Pain Relief Patch Ptch Apply 1 patch topically daily as needed (for pain- affected area).   spironolactone 50 MG tablet Commonly known as: ALDACTONE Take 1  tablet (50 mg total) by mouth daily.   terbinafine 1 % cream Commonly known as: LAMISIL Apply 1 Application topically See admin instructions. Apply to feet as directed every other day               Discharge Care Instructions  (From admission, onward)           Start     Ordered   08/06/22 0000  Discharge wound care:       Comments: Cleanse between all toes, dry well Weave silver hydrofiber Aquacel Ag between the affected toes and cover with silver affected areas of the dorsal foot. Top with conform or kerlix. Change every other day   08/06/22 1346            Allergies  Allergen Reactions   Penicillins Other (See Comments)    Convulsions, Patient could not walk, childhood allergy   Has patient had a PCN reaction causing immediate rash, facial/tongue/throat swelling, SOB or lightheadedness with hypotension: No Has patient had a PCN reaction causing severe  rash involving mucus membranes or skin necrosis: No Has patient had a PCN reaction that required hospitalization No Has patient had a PCN reaction occurring within the last 10 years: No If all of the above answers are "NO", then may proceed with Cephalosporin use.    Consultations:   Procedures:   Discharge Exam: BP 118/84 (BP Location: Left Arm)   Pulse 68   Temp 97.7 F (36.5 C) (Oral)   Resp 18   Ht 5\' 8"  (1.727 m)   SpO2 94%   BMI 33.83 kg/m  Physical Exam Constitutional:      General: He is not in acute distress.    Appearance: Normal appearance.  HENT:     Head: Normocephalic and atraumatic.     Mouth/Throat:     Mouth: Mucous membranes are moist.  Eyes:     Extraocular Movements: Extraocular movements intact.  Cardiovascular:     Rate and Rhythm: Normal rate and regular rhythm.     Heart sounds: Normal heart sounds.  Pulmonary:     Effort: Pulmonary effort is normal. No respiratory distress.     Breath sounds: Normal breath sounds. No wheezing.  Abdominal:     General: Bowel sounds  are normal. There is no distension.     Palpations: Abdomen is soft.     Tenderness: There is no abdominal tenderness.  Musculoskeletal:        General: Normal range of motion.     Cervical back: Normal range of motion and neck supple.  Skin:    General: Skin is warm and dry.  Neurological:     General: No focal deficit present.     Mental Status: He is alert.  Psychiatric:        Mood and Affect: Mood normal.        Behavior: Behavior normal.      The results of significant diagnostics from this hospitalization (including imaging, microbiology, ancillary and laboratory) are listed below for reference.   Microbiology: Recent Results (from the past 240 hour(s))  Resp panel by RT-PCR (RSV, Flu A&B, Covid) Anterior Nasal Swab     Status: None   Collection Time: 08/05/22  1:35 PM   Specimen: Anterior Nasal Swab  Result Value Ref Range Status   SARS Coronavirus 2 by RT PCR NEGATIVE NEGATIVE Final    Comment: (NOTE) SARS-CoV-2 target nucleic acids are NOT DETECTED.  The SARS-CoV-2 RNA is generally detectable in upper respiratory specimens during the acute phase of infection. The lowest concentration of SARS-CoV-2 viral copies this assay can detect is 138 copies/mL. A negative result does not preclude SARS-Cov-2 infection and should not be used as the sole basis for treatment or other patient management decisions. A negative result may occur with  improper specimen collection/handling, submission of specimen other than nasopharyngeal swab, presence of viral mutation(s) within the areas targeted by this assay, and inadequate number of viral copies(<138 copies/mL). A negative result must be combined with clinical observations, patient history, and epidemiological information. The expected result is Negative.  Fact Sheet for Patients:  BloggerCourse.com  Fact Sheet for Healthcare Providers:  SeriousBroker.it  This test is no t yet  approved or cleared by the [place] FDA and  has been authorized for detection and/or diagnosis of SARS-CoV-2 by FDA under an Emergency Use Authorization (EUA). This EUA will remain  in effect (meaning this test can be used) for the duration of the COVID-19 declaration under Section 564(b)(1) of the Act, 21 U.S.C.section 360bbb-3(b)(1), unless the  authorization is terminated  or revoked sooner.       Influenza A by PCR NEGATIVE NEGATIVE Final   Influenza B by PCR NEGATIVE NEGATIVE Final    Comment: (NOTE) The Xpert Xpress SARS-CoV-2/FLU/RSV plus assay is intended as an aid in the diagnosis of influenza from Nasopharyngeal swab specimens and should not be used as a sole basis for treatment. Nasal washings and aspirates are unacceptable for Xpert Xpress SARS-CoV-2/FLU/RSV testing.  Fact Sheet for Patients: BloggerCourse.comhttps://www.fda.gov/media/152166/download  Fact Sheet for Healthcare Providers: SeriousBroker.ithttps://www.fda.gov/media/152162/download  This test is not yet approved or cleared by the [place] FDA and has been authorized for detection and/or diagnosis of SARS-CoV-2 by FDA under an Emergency Use Authorization (EUA). This EUA will remain in effect (meaning this test can be used) for the duration of the COVID-19 declaration under Section 564(b)(1) of the Act, 21 U.S.C. section 360bbb-3(b)(1), unless the authorization is terminated or revoked.     Resp Syncytial Virus by PCR NEGATIVE NEGATIVE Final    Comment: (NOTE) Fact Sheet for Patients: BloggerCourse.comhttps://www.fda.gov/media/152166/download  Fact Sheet for Healthcare Providers: SeriousBroker.ithttps://www.fda.gov/media/152162/download  This test is not yet approved or cleared by the [place] FDA and has been authorized for detection and/or diagnosis of SARS-CoV-2 by FDA under an Emergency Use Authorization (EUA). This EUA will remain in effect (meaning this test can be used) for the duration of the COVID-19 declaration under Section 564(b)(1) of  the Act, 21 U.S.C. section 360bbb-3(b)(1), unless the authorization is terminated or revoked.  Performed at [facility], [address] [place], [place] [address]      Labs: BNP (last 3 results) Recent Labs    11/13/21 1251 07/31/22 1155 08/05/22 1305  BNP 433.5* 514.3* 452.3*   Basic Metabolic Panel: Recent Labs  Lab 07/31/22 1154 08/01/22 0535 08/05/22 1305 08/06/22 0517  NA 134* 138 132* 135  K 3.6 3.8 4.1 4.4  CL 97* 99 95* 98  CO2 26 28 26 26   GLUCOSE 87 91 101* 97  BUN 21 23 24* 27*  CREATININE 2.17* 2.19* 2.17* 2.16*  CALCIUM 9.4 9.3 9.1 9.1   Liver Function Tests: Recent Labs  Lab 08/01/22 0535 08/05/22 1305 08/06/22 0517  AST 15 18 15   ALT 8 8 9   ALKPHOS 52 57 56  BILITOT 1.5* 0.9 1.1  PROT 7.2 7.8 7.3  ALBUMIN 3.8 4.1 3.8   No results for input(s): "LIPASE", "AMYLASE" in the last 168 hours. No results for input(s): "AMMONIA" in the last 168 hours. CBC: Recent Labs  Lab 07/31/22 1154 08/01/22 0535 08/05/22 1305 08/06/22 0517  WBC 5.5 4.9 5.3 5.4  NEUTROABS 4.2  --  4.0  --   HGB 9.4* 8.9* 9.4* 8.9*  HCT 28.9* 27.7* 29.3* 27.8*  MCV 73.9* 74.7* 74.2* 74.9*  PLT 120* 116* 126* 122*   Cardiac Enzymes: No results for input(s): "CKTOTAL", "CKMB", "CKMBINDEX", "TROPONINI" in the last 168 hours. BNP: Invalid input(s): "POCBNP" CBG: No results for input(s): "GLUCAP" in the last 168 hours. D-Dimer No results for input(s): "DDIMER" in the last 72 hours. Hgb A1c No results for input(s): "HGBA1C" in the last 72 hours. Lipid Profile No results for input(s): "CHOL", "HDL", "LDLCALC", "TRIG", "CHOLHDL", "LDLDIRECT" in the last 72 hours. Thyroid function studies No results for input(s): "TSH", "T4TOTAL", "T3FREE", "THYROIDAB" in the last 72 hours.  Invalid input(s): "FREET3" Anemia work up No results for input(s): "VITAMINB12", "FOLATE", "FERRITIN", "TIBC", "IRON", "RETICCTPCT" in the last 72 hours. Urinalysis     Component Value Date/Time  COLORURINE YELLOW 05/03/2018 1628   APPEARANCEUR CLEAR 05/03/2018 1628   LABSPEC 1.012 05/03/2018 1628   PHURINE 5.0 05/03/2018 1628   GLUCOSEU NEGATIVE 05/03/2018 1628   HGBUR MODERATE (A) 05/03/2018 1628   BILIRUBINUR NEGATIVE 05/03/2018 1628   KETONESUR NEGATIVE 05/03/2018 1628   PROTEINUR NEGATIVE 05/03/2018 1628   UROBILINOGEN 1.0 10/31/2014 2127   NITRITE NEGATIVE 05/03/2018 1628   LEUKOCYTESUR NEGATIVE 05/03/2018 1628   Sepsis Labs Recent Labs  Lab 07/31/22 1154 08/01/22 0535 08/05/22 1305 08/06/22 0517  WBC 5.5 4.9 5.3 5.4   Microbiology Recent Results (from the past 240 hour(s))  Resp panel by RT-PCR (RSV, Flu A&B, Covid) Anterior Nasal Swab     Status: None   Collection Time: 08/05/22  1:35 PM   Specimen: Anterior Nasal Swab  Result Value Ref Range Status   SARS Coronavirus 2 by RT PCR NEGATIVE NEGATIVE Final    Comment: (NOTE) SARS-CoV-2 target nucleic acids are NOT DETECTED.  The SARS-CoV-2 RNA is generally detectable in upper respiratory specimens during the acute phase of infection. The lowest concentration of SARS-CoV-2 viral copies this assay can detect is 138 copies/mL. A negative result does not preclude SARS-Cov-2 infection and should not be used as the sole basis for treatment or other patient management decisions. A negative result may occur with  improper specimen collection/handling, submission of specimen other than nasopharyngeal swab, presence of viral mutation(s) within the areas targeted by this assay, and inadequate number of viral copies(<138 copies/mL). A negative result must be combined with clinical observations, patient history, and epidemiological information. The expected result is Negative.  Fact Sheet for Patients:  BloggerCourse.com  Fact Sheet for Healthcare Providers:  SeriousBroker.it  This test is no t yet approved or cleared by the [place] FDA and  has been authorized for detection and/or diagnosis of SARS-CoV-2 by FDA under an Emergency Use Authorization (EUA). This EUA will remain  in effect (meaning this test can be used) for the duration of the COVID-19 declaration under Section 564(b)(1) of the Act, 21 U.S.C.section 360bbb-3(b)(1), unless the authorization is terminated  or revoked sooner.       Influenza A by PCR NEGATIVE NEGATIVE Final   Influenza B by PCR NEGATIVE NEGATIVE Final    Comment: (NOTE) The Xpert Xpress SARS-CoV-2/FLU/RSV plus assay is intended as an aid in the diagnosis of influenza from Nasopharyngeal swab specimens and should not be used as a sole basis for treatment. Nasal washings and aspirates are unacceptable for Xpert Xpress SARS-CoV-2/FLU/RSV testing.  Fact Sheet for Patients: BloggerCourse.com  Fact Sheet for Healthcare Providers: SeriousBroker.it  This test is not yet approved or cleared by the [place] FDA and has been authorized for detection and/or diagnosis of SARS-CoV-2 by FDA under an Emergency Use Authorization (EUA). This EUA will remain in effect (meaning this test can be used) for the duration of the COVID-19 declaration under Section 564(b)(1) of the Act, 21 U.S.C. section 360bbb-3(b)(1), unless the authorization is terminated or revoked.     Resp Syncytial Virus by PCR NEGATIVE NEGATIVE Final    Comment: (NOTE) Fact Sheet for Patients: BloggerCourse.com  Fact Sheet for Healthcare Providers: SeriousBroker.it  This test is not yet approved or cleared by the [place] FDA and has been authorized for detection and/or diagnosis of SARS-CoV-2 by FDA under an Emergency Use Authorization (EUA). This EUA will remain in effect (meaning this test can be used) for the duration of the COVID-19 declaration under Section 564(b)(1) of the Act, 21 U.S.C. section  360bbb-3(b)(1), unless the authorization is terminated or revoked.  Performed at [facility], [address], [place], [place] [address]     Procedures/Studies: US Paracentesis  Result Date: 08/06/2022 INDICATION: History of CHF and cirrhosis. Abdominal distention with ascites. Request for diagnostic and therapeutic paracentesis. EXAM: ULTRASOUND GUIDED LEFT LOWER QUADRANT PARACENTESIS MEDICATIONS: 1% plain lidocaine, 7 mL COMPLICATIONS: None immediate. PROCEDURE: Informed written consent was obtained from the patient after a discussion of the risks, benefits and alternatives to treatment. A timeout was performed prior to the initiation of the procedure. Initial ultrasound scanning demonstrates a large amount of ascites within the left lower abdominal quadrant. The left lower abdomen was prepped and draped in the usual sterile fashion. 1% lidocaine was used for local anesthesia. Following this, a 19 gauge, 7-cm, Yueh catheter was introduced. An ultrasound image was saved for documentation purposes. The paracentesis was performed. The catheter was removed and a dressing was applied. The patient tolerated the procedure well without immediate post procedural complication. FINDINGS: A total of approximately 1750 mL of hazy, dark yellow fluid was removed. Samples were sent to the laboratory as requested by the clinical team. IMPRESSION: Successful ultrasound-guided paracentesis yielding 1.75 liters of peritoneal fluid. PLAN: If the patient eventually requires >/=2 paracenteses in a 30 day period, candidacy for formal evaluation by the [facility] will be assessed. Electronically Signed   By: Marliss Coots M.D.   On: 08/06/2022 11:48   DG Chest 2 View  Result Date: 08/05/2022 CLINICAL DATA:  Shortness of breath, history of congestive heart failure EXAM: CHEST - 2 VIEW COMPARISON:  Chest x-ray July 31, 2022 FINDINGS: The cardiomediastinal  silhouette is unchanged in contour. Mild bilateral interstitial pulmonary opacities. No pleural effusion or pneumothorax. The visualized upper abdomen is unremarkable. No acute osseous abnormality. IMPRESSION: Mild bilateral interstitial pulmonary opacities, likely edema. Electronically Signed   By: Jacob Moores M.D.   On: 08/05/2022 13:03   ECHOCARDIOGRAM COMPLETE  Result Date: 08/02/2022    ECHOCARDIOGRAM REPORT   Patient Name:   John [name] Date of Exam: 08/01/2022 Medical Rec #:  [id number]           Height:       68.0 in Accession #:    [id number]          Weight:       222.5 lb Date of Birth:  1956/02/03            BSA:          2.138 m Patient Age:    66 years            BP:           110/76 mmHg Patient Gender: M                   HR:           77 bpm. Exam Location:  Inpatient Procedure: 2D Echo, Color Doppler and Cardiac Doppler Indications:    CHF  History:        Patient has prior history of Echocardiogram examinations. CHF,                 CAD, Stroke; Risk Factors:Hypertension.  Sonographer:    Mike Gip Referring Phys: [id number] Azucena Fallen IMPRESSIONS  1. Left ventricular ejection fraction, by estimation, is 55 to 60%. The left ventricle has normal function. The left ventricle has no regional wall motion abnormalities. There is mild  concentric left ventricular hypertrophy. Left ventricular diastolic parameters are indeterminate.  2. Right ventricular systolic function is normal. The right ventricular size is normal. There is mildly elevated pulmonary artery systolic pressure.  3. Left atrial size was severely dilated.  4. Right atrial size was mildly dilated.  5. The mitral valve is normal in structure. No evidence of mitral valve regurgitation. No evidence of mitral stenosis.  6. The aortic valve is normal in structure. Aortic valve regurgitation is not visualized. No aortic stenosis is present.  7. Aortic dilatation noted. There is mild dilatation of the aortic root, measuring  41 mm. There is mild dilatation of the ascending aorta, measuring 40 mm.  8. The inferior vena cava is dilated in size with <50% respiratory variability, suggesting right atrial pressure of 15 mmHg. FINDINGS  Left Ventricle: Left ventricular ejection fraction, by estimation, is 55 to 60%. The left ventricle has normal function. The left ventricle has no regional wall motion abnormalities. The left ventricular internal cavity size was normal in size. There is  mild concentric left ventricular hypertrophy. Left ventricular diastolic parameters are indeterminate. Right Ventricle: The right ventricular size is normal. No increase in right ventricular wall thickness. Right ventricular systolic function is normal. There is mildly elevated pulmonary artery systolic pressure. The tricuspid regurgitant velocity is 2.43  m/s, and with an assumed right atrial pressure of 15 mmHg, the estimated right ventricular systolic pressure is 38.6 mmHg. Left Atrium: Left atrial size was severely dilated. Right Atrium: Right atrial size was mildly dilated. Pericardium: There is no evidence of pericardial effusion. Mitral Valve: The mitral valve is normal in structure. No evidence of mitral valve regurgitation. No evidence of mitral valve stenosis. Tricuspid Valve: The tricuspid valve is normal in structure. Tricuspid valve regurgitation is mild . No evidence of tricuspid stenosis. Aortic Valve: The aortic valve is normal in structure. Aortic valve regurgitation is not visualized. No aortic stenosis is present. Pulmonic Valve: The pulmonic valve was normal in structure. Pulmonic valve regurgitation is not visualized. No evidence of pulmonic stenosis. Aorta: Aortic dilatation noted. There is mild dilatation of the aortic root, measuring 41 mm. There is mild dilatation of the ascending aorta, measuring 40 mm. Venous: The inferior vena cava is dilated in size with less than 50% respiratory variability, suggesting right atrial pressure of 15  mmHg. IAS/Shunts: No atrial level shunt detected by color flow Doppler.  LEFT VENTRICLE PLAX 2D LVIDd:         4.90 cm      Diastology LVIDs:         3.20 cm      LV e' medial:    9.14 cm/s LV PW:         1.20 cm      LV E/e' medial:  8.2 LV IVS:        1.20 cm      LV e' lateral:   9.79 cm/s LVOT diam:     2.40 cm      LV E/e' lateral: 7.7 LV SV:         74 LV SV Index:   34 LVOT Area:     4.52 cm  LV Volumes (MOD) LV vol d, MOD A2C: 127.0 ml LV vol d, MOD A4C: 112.0 ml LV vol s, MOD A2C: 57.3 ml LV vol s, MOD A4C: 49.0 ml LV SV MOD A2C:     69.7 ml LV SV MOD A4C:     112.0 ml LV SV MOD BP:  67.0 ml RIGHT VENTRICLE            IVC RV Basal diam:  4.40 cm    IVC diam: 2.60 cm RV S prime:     9.90 cm/s TAPSE (M-mode): 1.5 cm LEFT ATRIUM              Index        RIGHT ATRIUM           Index LA diam:        4.00 cm  1.87 cm/m   RA Area:     23.40 cm LA Vol (A2C):   114.0 ml 53.31 ml/m  RA Volume:   66.80 ml  31.24 ml/m LA Vol (A4C):   106.0 ml 49.57 ml/m LA Biplane Vol: 117.0 ml 54.71 ml/m  AORTIC VALVE LVOT Vmax:   76.70 cm/s LVOT Vmean:  52.900 cm/s LVOT VTI:    0.163 m  AORTA Ao Root diam: 4.10 cm Ao Asc diam:  4.00 cm MITRAL VALVE               TRICUSPID VALVE MV Area (PHT): 3.42 cm    TR Peak grad:   23.6 mmHg MV Decel Time: 222 msec    TR Vmax:        243.00 cm/s MV E velocity: 74.90 cm/s MV A velocity: 35.60 cm/s  SHUNTS MV E/A ratio:  2.10        Systemic VTI:  0.16 m                            Systemic Diam: 2.40 cm Chilton Si MD Electronically signed by Chilton Si MD Signature Date/Time: 08/02/2022/7:40:15 AM    Final    DG Chest 1 View  Result Date: 07/31/2022 CLINICAL DATA:  Shortness of breath, edema in the lower extremities EXAM: CHEST  1 VIEW COMPARISON:  11/13/2021 FINDINGS: Transverse diameter of heart is increased. There is slight prominence of central pulmonary vessels. There are no signs of alveolar pulmonary edema. There is increase in markings in right lower lung field.  There is minimal blunting of lateral CP angles. There is no pneumothorax. Metallic coil is seen in the epigastrium, possibly resolved from previous vascular intervention. IMPRESSION: Cardiomegaly. Central pulmonary vessels are slightly prominent without signs of alveolar pulmonary edema. Increased markings are seen in right lower lung field suggesting mild asymmetric interstitial pulmonary edema or interstitial pneumonia. Lateral CP angles are indistinct suggesting possible pleural thickening or minimal effusions. Electronically Signed   By: Ernie Avena M.D.   On: 07/31/2022 11:59     Time coordinating discharge: Over 30 minutes    Lewie Chamber, MD  [facility] 08/06/2022, 2:53 PM

## 2022-08-06 NOTE — TOC Initial Note (Signed)
Transition of Care [facility]) - Initial/Assessment Note    Patient Details  Name: John [name] MRN: [id number] Date of Birth: 1955/11/14  [facility] CM/SW Contact:    Erin Sons, LCSW Phone Number: 08/06/2022, 2:34 PM  Clinical Narrative:                  CSW notified pt needing [facility] RN for wound care.CSW spoke with pt who states he lives at home alone in [place]. [facility]week. He consents to CSW arranging HH and thinks he would be able to change his dressings on days when [facility] doesn't come. He uses a rollator at home and states he would need transport at DC.    CSW spoke with [facility] Child psychotherapist and Charity fundraiser. Pt goes to [facility] on Thursdays. They state that [facility] would be the ones to approve/arrange HH. Plan will be for pt to go to [facility] on Thursday and from there they will evaluate and determine if they will arrange [facility] or have pt come into [facility] clinic more than 1x/week. [facility] will be able transport pt home. Transport scheduled for 4pm and nursing unit phone number provided to [facility]  CSW emailed wound orders, HH orders, and face to face to [email or web address]   Expected Discharge Plan: Home w Home Health Services Barriers to Discharge: No Barriers Identified   Patient Goals and CMS Choice            Expected Discharge Plan and Services       Living arrangements for the past 2 months: Apartment Expected Discharge Date: 08/06/22                                    Prior Living Arrangements/Services Living arrangements for the past 2 months: Apartment Lives with:: Self Patient language and need for interpreter reviewed:: Yes        Need for Family Participation in Patient Care: No (Comment) Care giver support system in place?: Yes (comment) Current home services: DME Criminal Activity/Legal Involvement Pertinent to Current Situation/Hospitalization: No - Comment as needed  Activities of Daily Living Home Assistive  Devices/Equipment: Cane (specify quad or straight), Walker (specify type) ADL Screening (condition at time of admission) Patient's cognitive ability adequate to safely complete daily activities?: Yes Is the patient deaf or have difficulty hearing?: No Does the patient have difficulty seeing, even when wearing glasses/contacts?: No Does the patient have difficulty concentrating, remembering, or making decisions?: No Patient able to express need for assistance with ADLs?: Yes Does the patient have difficulty dressing or bathing?: No Independently performs ADLs?: Yes (appropriate for developmental age) Does the patient have difficulty walking or climbing stairs?: Yes Weakness of Legs: Both Weakness of Arms/Hands: None  Permission Sought/Granted                  Emotional Assessment Appearance:: Appears stated age Attitude/Demeanor/Rapport: Engaged, Gracious Affect (typically observed): Accepting Orientation: : Oriented to Self, Oriented to Place, Oriented to  Time, Oriented to Situation Alcohol / Substance Use: Not Applicable Psych Involvement: No (comment)  Admission diagnosis:  Ascites due to alcoholic cirrhosis [K70.31] Acute on chronic congestive heart failure, unspecified heart failure type [I50.9] Patient Active Problem List   Diagnosis Date Noted   Stasis dermatitis of both legs 08/05/2022   Glaucoma (increased eye pressure) 08/05/2022   COPD (chronic obstructive pulmonary disease) 08/05/2022   Acute exacerbation of  congestive heart failure 07/31/2022   Renal mass, left 07/05/2020   Severe obesity (BMI 35.0-39.9) with comorbidity 06/25/2018   Acute gouty arthritis 05/06/2018   Polyarthralgia 05/03/2018   History of renal cell carcinoma 01/12/2018   Esophageal varices without bleeding    Renal cell carcinoma of left kidney 11/08/2016   Malnutrition of moderate degree 07/20/2015   CHF (congestive heart failure) 07/19/2015   Abdominal pain 07/19/2015   Renal failure  (ARF), acute on chronic 07/19/2015   Acute-on-chronic kidney injury    Hyponatremia 07/07/2015   Dehydration 07/07/2015   Acute respiratory failure 07/07/2015   Chronic kidney disease, stage 3 05/25/2015   CAP (community acquired pneumonia) 05/23/2015   Acute respiratory failure with hypoxia 05/23/2015   Fluid overload 10/31/2014   Hepatitis C 10/31/2014   Ascites of liver 10/31/2014   Hepatitis, viral    Edema of abdominal wall    Pedal edema    Alcoholism    Abdominal edema on examination 09/01/2014   Chronic liver failure    Alcoholic cirrhosis of liver with ascites (HCC)    Chronic hepatitis C with cirrhosis    Cardiac hypertrophy    Fever of unknown origin    Diastolic dysfunction    Mental status change    Metabolic encephalopathy    Pulmonary edema    Acute encephalopathy    Ascites due to alcoholic cirrhosis (HCC)    Hypernatremia    Other specified fever    Varices, gastric    Acute on chronic renal failure    Elevated transaminase level    Essential hypertension    Gastric varices    Upper GI bleed 08/14/2014   Acute blood loss anemia 08/14/2014   Alcohol abuse 08/14/2014   Coagulopathy 08/14/2014   AKI (acute kidney injury) (HCC) 08/14/2014   GI bleed 08/14/2014   GIB (gastrointestinal bleeding) 08/14/2014   Bleeding gastric varices 08/14/2014   Acute upper GI bleed    Cirrhosis (HCC) 07/27/2014   Abdominal pain, generalized 07/27/2014   Ascites 07/26/2014   Optic neuritis 07/11/2014   Abnormal LFTs    Vision loss of left eye 06/24/2014   Accelerated hypertension 06/24/2014   Elevated LFTs 06/24/2014   Polysubstance abuse 06/24/2014   Stage 3b chronic kidney disease 06/24/2014   SOB (shortness of breath) 06/24/2014   Vision loss, left eye 06/24/2014   Thrombocytopenia (HCC) 06/24/2014   Hypokalemia 06/24/2014   Elevated troponin 06/24/2014   Essential hypertension, benign 12/29/2012   Neck pain 12/29/2012   Low back pain 12/29/2012   PCP:   Angela Cox, MD (Inactive) Pharmacy:   [facility]- [place], [place] - [address] [place] [address] Phone: [phone] Fax: [phone]     Social Determinants of Health (SDOH) Social History: SDOH Screenings   Food Insecurity: No Food Insecurity (08/06/2022)  Housing: Low Risk  (08/06/2022)  Transportation Needs: No Transportation Needs (08/06/2022)  Utilities: Not At Risk (08/06/2022)  Tobacco Use: Medium Risk (08/05/2022)   SDOH Interventions:     Readmission Risk Interventions     No data to display

## 2022-08-06 NOTE — Consult Note (Signed)
WOC Nurse Consult Note: Reason for Consult: weeping LEs Patient was just seen by this WOC nurse 08/01/22 for same. LE edema with abdominal ascites  Wound type: moisture associated skin weeping and maceration with associated edema  Pressure Injury POA: NA Wound bed: serous/weeping crusted areas left and right toes/webspaces  Drainage (amount, consistency, odor)  serous Periwound: venous dermatitis  Dressing procedure/placement/frequency: Cleanse toes and web spaces with saline, pat dry Weave silver hydrofiber between toes and place over the dorsal foot wound, top with conform or kerlix. Change every other day   Re consult if needed, will not follow at this time. Thanks  Shealee Yordy [employer], RN,CWOCN, CNS, CWON-AP [phone])

## 2022-08-06 NOTE — Hospital Course (Signed)
Alcoholic cirrhosis with ascites -Patient with recent admission for CHF exacerbation presenting with SOB, LE edema, and ascites -His primary issue currently appears to be related to the ascites and all issues are likely to improve with paracentesis -This may have been exacerbated by cocaine/THC use -Renal function appears to be stable -BNP is actually better than prior -Will continue home Lasix -Paracentesis performed on 08/06/2022 removing 1.75 L and patient felt better after this - given improvement, patient felt to be stable for d/c home - continue home regimen   Stasis wounds -LE edema has led to weeping wounds, particularly on toes -Evaluated by wound care - Ongoing wound care discharge.  Patient already follows   Chronic diastolic CHF -Last hospitalization, CHF was the presumed diagnosis -4/4 echo reassuring -While this may have been the case, decompensated cirrhosis can mimic the symptoms of CHF as well -Appears compensated from a heart failure standpoint currently   Stage 3b CKD -Likely progressive due to medication noncompliance and ongoing polysubstance abuse -Needs avoidance of nephrotoxic medications -Appears to be stable at this time   HTN -continue carvedilol   HLD -Continue Lipitor   Polysubstance abuse -Cocaine and THC cessation encouraged -UDS ordered    Glaucoma -Continue brimonidine -Left eye vision loss chronically   COPD -Continues to smoke tobacco but not heavily -Continue Flovent

## 2022-08-06 NOTE — Progress Notes (Signed)
Patient is being dicharged home. All discharge instructions reviewed with patient. Wound dressing change on foot prior to DC. Pt verbalized a full understanding of discharge instructions. [facility] to pick up patient and transport home.

## 2022-08-07 ENCOUNTER — Encounter (HOSPITAL_COMMUNITY): Payer: Self-pay | Admitting: *Deleted

## 2022-08-09 LAB — BODY FLUID CULTURE W GRAM STAIN: Culture: NO GROWTH

## 2022-08-12 LAB — PATHOLOGIST SMEAR REVIEW

## 2022-08-22 ENCOUNTER — Other Ambulatory Visit: Payer: Self-pay | Admitting: Internal Medicine

## 2022-08-22 DIAGNOSIS — C649 Malignant neoplasm of unspecified kidney, except renal pelvis: Secondary | ICD-10-CM

## 2022-09-05 ENCOUNTER — Encounter (HOSPITAL_COMMUNITY): Payer: Self-pay | Admitting: *Deleted

## 2022-09-10 ENCOUNTER — Encounter (HOSPITAL_COMMUNITY): Payer: Self-pay | Admitting: *Deleted

## 2022-09-18 ENCOUNTER — Ambulatory Visit: Payer: Medicaid Other | Admitting: Cardiology

## 2022-09-20 ENCOUNTER — Other Ambulatory Visit: Payer: Medicaid Other

## 2022-09-25 ENCOUNTER — Ambulatory Visit
Admission: RE | Admit: 2022-09-25 | Discharge: 2022-09-25 | Disposition: A | Discharge status: Home / Self Care | Payer: Medicare (Managed Care) | Source: Ambulatory Visit | Attending: Internal Medicine | Admitting: Internal Medicine

## 2022-09-25 DIAGNOSIS — C649 Malignant neoplasm of unspecified kidney, except renal pelvis: Secondary | ICD-10-CM

## 2022-09-25 MED ORDER — GADOPICLENOL 0.5 MMOL/ML IV SOLN
10.0000 mL | Freq: Once | INTRAVENOUS | Status: AC | PRN
  Administered 2022-09-25: 10 mL via INTRAVENOUS

## 2022-10-28 ENCOUNTER — Encounter (HOSPITAL_COMMUNITY): Payer: Self-pay | Admitting: *Deleted

## 2022-11-02 ENCOUNTER — Encounter (HOSPITAL_COMMUNITY): Payer: Self-pay | Admitting: *Deleted

## 2022-11-04 NOTE — Progress Notes (Signed)
Cardiology Office Note:   Date:  11/05/2022  ID:  John [name], DOB [birth year], MRN [id number]  History of Present Illness:   John [name] is a 67 y.o. male with history of alcoholic cirrhosis, hepatitis C, history of upper GIB, COPD, CKD 3B, HFpEF, polysubstance abuse and HTN who was referred by Dr. Leticia Penna for further evaluation of diastolic HF.  Patient hospitalized at [facility] in 07/2022 with worsening volume overload thought to be mixed in etiology in the setting of acute on chronic diastolic HF, cirrhosis and CKD. TTE with EF 55-60%, normal RV, mild PHTN, no significant valve disease, Aorta 40mm. Was aggressively diuresed with improvement and now referred to Cardiology for further evaluation.  Today, the patient overall feels okay. Has been struggling with a URI symptoms recently but otherwise has been feeling okay. Denies chest pain, SOB at rest, orthopnea, or PND. Has chronic L>R lower extremity edema. Has only been taking his lasix daily. Wt has been 227lbs which he states has been stable but prior discharge weight was 220lbs in 07/2022.  Continues to use crack at home. Last use yesterday. Denies alcohol use.  Past Medical History:  Diagnosis Date   Allergy    Arthritis    oa back   Asthma    Cataract    both eyes   CHF (congestive heart failure) (HCC) 2001   sees primary for   Chronic back pain    Chronic kidney disease    ckd 3   Cirrhosis (HCC)    alcoholic with h/o varices, ascites   Coronary artery disease    nonobstrucrtibe   ETOH abuse    GERD (gastroesophageal reflux disease)    Gout    Hepatitis    hepatitic c 2018 24 week tx with ecuplipsa, no hepatitic c detected after tx   History of blood transfusion 8-9- yrs ago   Hypertension    Optic neuropathy, left    PAD (peripheral artery disease) (HCC)    slight  primary manages   Polysubstance abuse (HCC)    Renal cell carcinoma (HCC) 2016, 2018 and 2022   left ablation   Seizures [facility])    age 38 none  since   Sickle cell trait (HCC)    Stroke (HCC) 1998   "light no problems with"   Uses walker 12/04/2020   Wears dentures    Wears glasses      ROS: As per HPI  Studies Reviewed:    EKG:  No new tracing  Cardiac Studies & Procedures       ECHOCARDIOGRAM  ECHOCARDIOGRAM COMPLETE 08/02/2022  Narrative ECHOCARDIOGRAM REPORT    Patient Name:   John [name] Date of Exam: 08/01/2022 Medical Rec #:  [id number]           Height:       68.0 in Accession #:    [id number]          Weight:       222.5 lb Date of Birth:  27-Jun-1955            BSA:          2.138 m Patient Age:    66 years            BP:           110/76 mmHg Patient Gender: M                   HR:  77 bpm. Exam Location:  Inpatient  Procedure: 2D Echo, Color Doppler and Cardiac Doppler  Indications:    CHF  History:        Patient has prior history of Echocardiogram examinations. CHF, CAD, Stroke; Risk Factors:Hypertension.  Sonographer:    Mike Gip Referring Phys: [id number] Azucena Fallen  IMPRESSIONS   1. Left ventricular ejection fraction, by estimation, is 55 to 60%. The left ventricle has normal function. The left ventricle has no regional wall motion abnormalities. There is mild concentric left ventricular hypertrophy. Left ventricular diastolic parameters are indeterminate. 2. Right ventricular systolic function is normal. The right ventricular size is normal. There is mildly elevated pulmonary artery systolic pressure. 3. Left atrial size was severely dilated. 4. Right atrial size was mildly dilated. 5. The mitral valve is normal in structure. No evidence of mitral valve regurgitation. No evidence of mitral stenosis. 6. The aortic valve is normal in structure. Aortic valve regurgitation is not visualized. No aortic stenosis is present. 7. Aortic dilatation noted. There is mild dilatation of the aortic root, measuring 41 mm. There is mild dilatation of the ascending aorta, measuring  40 mm. 8. The inferior vena cava is dilated in size with <50% respiratory variability, suggesting right atrial pressure of 15 mmHg.  FINDINGS Left Ventricle: Left ventricular ejection fraction, by estimation, is 55 to 60%. The left ventricle has normal function. The left ventricle has no regional wall motion abnormalities. The left ventricular internal cavity size was normal in size. There is mild concentric left ventricular hypertrophy. Left ventricular diastolic parameters are indeterminate.  Right Ventricle: The right ventricular size is normal. No increase in right ventricular wall thickness. Right ventricular systolic function is normal. There is mildly elevated pulmonary artery systolic pressure. The tricuspid regurgitant velocity is 2.43 m/s, and with an assumed right atrial pressure of 15 mmHg, the estimated right ventricular systolic pressure is 38.6 mmHg.  Left Atrium: Left atrial size was severely dilated.  Right Atrium: Right atrial size was mildly dilated.  Pericardium: There is no evidence of pericardial effusion.  Mitral Valve: The mitral valve is normal in structure. No evidence of mitral valve regurgitation. No evidence of mitral valve stenosis.  Tricuspid Valve: The tricuspid valve is normal in structure. Tricuspid valve regurgitation is mild . No evidence of tricuspid stenosis.  Aortic Valve: The aortic valve is normal in structure. Aortic valve regurgitation is not visualized. No aortic stenosis is present.  Pulmonic Valve: The pulmonic valve was normal in structure. Pulmonic valve regurgitation is not visualized. No evidence of pulmonic stenosis.  Aorta: Aortic dilatation noted. There is mild dilatation of the aortic root, measuring 41 mm. There is mild dilatation of the ascending aorta, measuring 40 mm.  Venous: The inferior vena cava is dilated in size with less than 50% respiratory variability, suggesting right atrial pressure of 15 mmHg.  IAS/Shunts: No atrial  level shunt detected by color flow Doppler.   LEFT VENTRICLE PLAX 2D LVIDd:         4.90 cm      Diastology LVIDs:         3.20 cm      LV e' medial:    9.14 cm/s LV PW:         1.20 cm      LV E/e' medial:  8.2 LV IVS:        1.20 cm      LV e' lateral:   9.79 cm/s LVOT diam:     2.40 cm  LV E/e' lateral: 7.7 LV SV:         74 LV SV Index:   34 LVOT Area:     4.52 cm  LV Volumes (MOD) LV vol d, MOD A2C: 127.0 ml LV vol d, MOD A4C: 112.0 ml LV vol s, MOD A2C: 57.3 ml LV vol s, MOD A4C: 49.0 ml LV SV MOD A2C:     69.7 ml LV SV MOD A4C:     112.0 ml LV SV MOD BP:      67.0 ml  RIGHT VENTRICLE            IVC RV Basal diam:  4.40 cm    IVC diam: 2.60 cm RV S prime:     9.90 cm/s TAPSE (M-mode): 1.5 cm  LEFT ATRIUM              Index        RIGHT ATRIUM           Index LA diam:        4.00 cm  1.87 cm/m   RA Area:     23.40 cm LA Vol (A2C):   114.0 ml 53.31 ml/m  RA Volume:   66.80 ml  31.24 ml/m LA Vol (A4C):   106.0 ml 49.57 ml/m LA Biplane Vol: 117.0 ml 54.71 ml/m AORTIC VALVE LVOT Vmax:   76.70 cm/s LVOT Vmean:  52.900 cm/s LVOT VTI:    0.163 m  AORTA Ao Root diam: 4.10 cm Ao Asc diam:  4.00 cm  MITRAL VALVE               TRICUSPID VALVE MV Area (PHT): 3.42 cm    TR Peak grad:   23.6 mmHg MV Decel Time: 222 msec    TR Vmax:        243.00 cm/s MV E velocity: 74.90 cm/s MV A velocity: 35.60 cm/s  SHUNTS MV E/A ratio:  2.10        Systemic VTI:  0.16 m Systemic Diam: 2.40 cm  Chilton Si MD Electronically signed by Chilton Si MD Signature Date/Time: 08/02/2022/7:40:15 AM    Final              Risk Assessment/Calculations:              Physical Exam:   VS:  BP 116/84   Pulse 84   Ht 5\' 8"  (1.727 m)   Wt 227 lb (103 kg)   SpO2 95%   BMI 34.52 kg/m    Wt Readings from Last 3 Encounters:  11/05/22 227 lb (103 kg)  08/01/22 222 lb 8 oz (100.9 kg)  12/26/21 239 lb 9.6 oz (108.7 kg)     GEN: Well nourished, well developed in no  acute distress NECK: JVD to angle of the mandible; No carotid bruits CARDIAC: RRR, no murmurs, rubs, gallops RESPIRATORY:  Diffusely rhonchorous ABDOMEN: Obese, soft EXTREMITIES:  Warm, trace edema bilaterally  ASSESSMENT AND PLAN:   #Acute on Chronic Diastolic HF: -TTE with LVEF 55-60%, normal RV, mild pHTN, no significant valve disease -Has had multiple admissions for volume overload likely multifactorial in nature in the setting of HFpEF, cirrhosis and CKD -Currently, volume overloaded on exam with elevated JVD and LE edema but has only been taking lasix 60mg  daily instead of BID -Check BMET and BNP -Check CXR as patient is diffusely rhonchorous on exam today -Discussed importance on going back on lasix 60mg  PO BID -Continue spironolactone 50mg  daily -Start jardiance 10mg  daily -Discussed  abstinence from crack/cocaine  #Dyspnea: -Patient very rhonchorous on exam today and reports he has been having URI symptoms -Also volume overloaded in the setting of medication noncompliance -Suspect this may be related to volume, but will check CXR to ensure no PNA  #HTN: -Controlled today <130/90 -Continue coreg 3.125mg  BID -Continue spironolactone 50mg  daily  #HLD: -Continue lipitor 40mg  daily  #Alcoholic Cirrhosis: -Management per GI -Continue lasix and spiro as prescribed  #Polysubstance Abuse: -Encouraged cessation        Signed, Meriam Sprague, MD

## 2022-11-05 ENCOUNTER — Ambulatory Visit: Payer: Medicare (Managed Care) | Attending: Cardiology | Admitting: Cardiology

## 2022-11-05 ENCOUNTER — Encounter: Payer: Self-pay | Admitting: Cardiology

## 2022-11-05 VITALS — BP 116/84 | HR 84 | Ht 68.0 in | Wt 227.0 lb

## 2022-11-05 DIAGNOSIS — Z79899 Other long term (current) drug therapy: Secondary | ICD-10-CM

## 2022-11-05 DIAGNOSIS — I5033 Acute on chronic diastolic (congestive) heart failure: Secondary | ICD-10-CM | POA: Diagnosis not present

## 2022-11-05 DIAGNOSIS — I10 Essential (primary) hypertension: Secondary | ICD-10-CM | POA: Diagnosis not present

## 2022-11-05 DIAGNOSIS — F1910 Other psychoactive substance abuse, uncomplicated: Secondary | ICD-10-CM

## 2022-11-05 DIAGNOSIS — E7800 Pure hypercholesterolemia, unspecified: Secondary | ICD-10-CM

## 2022-11-05 DIAGNOSIS — K7031 Alcoholic cirrhosis of liver with ascites: Secondary | ICD-10-CM

## 2022-11-05 DIAGNOSIS — R0602 Shortness of breath: Secondary | ICD-10-CM

## 2022-11-05 MED ORDER — FUROSEMIDE 20 MG PO TABS
60.0000 mg | ORAL_TABLET | Freq: Two times a day (BID) | ORAL | 4 refills | Status: DC
Start: 2022-11-05 — End: 2023-05-14

## 2022-11-05 MED ORDER — FUROSEMIDE 20 MG PO TABS
60.0000 mg | ORAL_TABLET | Freq: Two times a day (BID) | ORAL | 4 refills | Status: DC
Start: 2022-11-05 — End: 2022-11-05

## 2022-11-05 MED ORDER — EMPAGLIFLOZIN 10 MG PO TABS
10.0000 mg | ORAL_TABLET | Freq: Every day | ORAL | 6 refills | Status: DC
Start: 2022-11-05 — End: 2023-05-14

## 2022-11-05 MED ORDER — EMPAGLIFLOZIN 10 MG PO TABS: 10.0000 mg | ORAL_TABLET | Freq: Every day | ORAL | 0 refills | Status: DC

## 2022-11-05 MED ORDER — EMPAGLIFLOZIN 10 MG PO TABS
10.00 mg | ORAL_TABLET | Freq: Every day | ORAL | 6 refills | Status: DC
Start: 2022-11-05 — End: 2022-11-05

## 2022-11-05 NOTE — Patient Instructions (Addendum)
Medication Instructions:   PLEASE START BACK TAKING YOUR LASIX 60 MG BY MOUTH TWICE DAILY--PRINTED PRESCRIPTION GIVEN TO PATIENT TO PROVIDE [facility] TO FILL  START TAKING JARDIANCE 10 MG BY MOUTH DAILY BEFORE BREAKFAST--PRINTED PRESCRIPTION GIVEN TO PATIENT TO PROVIDE TO [facility] TO FILL--ALSO SAMPLES OF THIS MEDICATION WITH COPAY CARDS PROVIDED TO THE PATIENT TODAY  *If you need a refill on your cardiac medications before your next appointment, please call your pharmacy*   Lab Work:  TODAY--BMET AND PRO-BNP  If you have labs (blood work) drawn today and your tests are completely normal, you will receive your results only by: MyChart Message (if you have MyChart) OR A paper copy in the mail If you have any lab test that is abnormal or we need to change your treatment, we will call you to review the results.   Testing/Procedures:  Your provider recommends you have a CHEST X-RAY.  PLEASE HAVE YOUR TRANSPORTATION TAKE YOU TO  IMAGING AT [address] --CAN GET DONE TODAY OR TOMORROW    Follow-Up:  3 MONTHS WITH AN EXTENDER IN THE OFFICE

## 2022-11-06 ENCOUNTER — Telehealth: Payer: Self-pay

## 2022-11-06 LAB — BASIC METABOLIC PANEL
BUN/Creatinine Ratio: 10 (ref 10–24)
BUN: 14 mg/dL (ref 8–27)
CO2: 23 mmol/L (ref 20–29)
Calcium: 9.4 mg/dL (ref 8.6–10.2)
Chloride: 98 mmol/L (ref 96–106)
Creatinine, Ser: 1.43 mg/dL — ABNORMAL HIGH (ref 0.76–1.27)
Glucose: 93 mg/dL (ref 70–99)
Potassium: 4.3 mmol/L (ref 3.5–5.2)
Sodium: 140 mmol/L (ref 134–144)
eGFR: 54 mL/min/{1.73_m2} — ABNORMAL LOW (ref >59)

## 2022-11-06 LAB — PRO B NATRIURETIC PEPTIDE: NT-Pro BNP: 3248 pg/mL — ABNORMAL HIGH (ref 0–376)

## 2022-11-06 NOTE — Telephone Encounter (Signed)
Attempted to contact patient x2 - unable to leave message, no voicemail set up

## 2022-11-06 NOTE — Telephone Encounter (Signed)
-----   Message from Meriam Sprague, MD sent at 11/06/2022 12:59 PM EDT ----- Fluid levels are very elevated. Kidney function and electrolytes are stable. We need to ensure he is taking the lasix 60mg  BID and watching his weight and ensure it is going down. If he is not losing weight, let us know and we can adjust his diuretic.   Can we move up his follow-up appointment to 4 week follow-up to ensure his volume status is improving? He is really tenuous. Would also repeat BNP and BMET in 2 weeks to ensure his fluid levels are improving.

## 2022-11-07 ENCOUNTER — Encounter (HOSPITAL_COMMUNITY): Payer: Self-pay | Admitting: *Deleted

## 2022-11-08 ENCOUNTER — Other Ambulatory Visit: Payer: Self-pay

## 2022-11-08 ENCOUNTER — Emergency Department (HOSPITAL_COMMUNITY): Payer: Medicare (Managed Care)

## 2022-11-08 ENCOUNTER — Emergency Department (HOSPITAL_COMMUNITY)
Admission: EM | Admit: 2022-11-08 | Discharge: 2022-11-08 | Disposition: A | Discharge status: Home / Self Care | Payer: Medicare (Managed Care) | Attending: Emergency Medicine | Admitting: Emergency Medicine

## 2022-11-08 DIAGNOSIS — M546 Pain in thoracic spine: Secondary | ICD-10-CM | POA: Insufficient documentation

## 2022-11-08 DIAGNOSIS — I130 Hypertensive heart and chronic kidney disease with heart failure and stage 1 through stage 4 chronic kidney disease, or unspecified chronic kidney disease: Secondary | ICD-10-CM | POA: Insufficient documentation

## 2022-11-08 DIAGNOSIS — I509 Heart failure, unspecified: Secondary | ICD-10-CM | POA: Diagnosis not present

## 2022-11-08 DIAGNOSIS — R600 Localized edema: Secondary | ICD-10-CM | POA: Diagnosis not present

## 2022-11-08 DIAGNOSIS — W1839XA Other fall on same level, initial encounter: Secondary | ICD-10-CM | POA: Insufficient documentation

## 2022-11-08 DIAGNOSIS — N189 Chronic kidney disease, unspecified: Secondary | ICD-10-CM | POA: Insufficient documentation

## 2022-11-08 DIAGNOSIS — M545 Low back pain, unspecified: Secondary | ICD-10-CM

## 2022-11-08 DIAGNOSIS — W19XXXA Unspecified fall, initial encounter: Secondary | ICD-10-CM

## 2022-11-08 LAB — COMPREHENSIVE METABOLIC PANEL
ALT: 11 U/L (ref 0–44)
AST: 19 U/L (ref 15–41)
Albumin: 3.8 g/dL (ref 3.5–5.0)
Alkaline Phosphatase: 65 U/L (ref 38–126)
Anion gap: 8 (ref 5–15)
BUN: 17 mg/dL (ref 8–23)
CO2: 27 mmol/L (ref 22–32)
Calcium: 8.6 mg/dL — ABNORMAL LOW (ref 8.9–10.3)
Chloride: 102 mmol/L (ref 98–111)
Creatinine, Ser: 1.65 mg/dL — ABNORMAL HIGH (ref 0.61–1.24)
GFR, Estimated: 46 mL/min — ABNORMAL LOW (ref >60)
Glucose, Bld: 90 mg/dL (ref 70–99)
Potassium: 3.9 mmol/L (ref 3.5–5.1)
Sodium: 137 mmol/L (ref 135–145)
Total Bilirubin: 1.8 mg/dL — ABNORMAL HIGH (ref 0.3–1.2)
Total Protein: 8.4 g/dL — ABNORMAL HIGH (ref 6.5–8.1)

## 2022-11-08 LAB — CBC
HCT: 29.6 % — ABNORMAL LOW (ref 39.0–52.0)
Hemoglobin: 9 g/dL — ABNORMAL LOW (ref 13.0–17.0)
MCH: 23.7 pg — ABNORMAL LOW (ref 26.0–34.0)
MCHC: 30.4 g/dL (ref 30.0–36.0)
MCV: 78.1 fL — ABNORMAL LOW (ref 80.0–100.0)
Platelets: 121 10*3/uL — ABNORMAL LOW (ref 150–400)
RBC: 3.79 MIL/uL — ABNORMAL LOW (ref 4.22–5.81)
RDW: 19 % — ABNORMAL HIGH (ref 11.5–15.5)
WBC: 5.6 10*3/uL (ref 4.0–10.5)
nRBC: 0 % (ref 0.0–0.2)

## 2022-11-08 LAB — MAGNESIUM: Magnesium: 1.6 mg/dL — ABNORMAL LOW (ref 1.7–2.4)

## 2022-11-08 LAB — BRAIN NATRIURETIC PEPTIDE: B Natriuretic Peptide: 493.1 pg/mL — ABNORMAL HIGH (ref 0.0–100.0)

## 2022-11-08 MED ORDER — FUROSEMIDE 40 MG PO TABS
60.0000 mg | ORAL_TABLET | Freq: Once | ORAL | Status: AC
  Administered 2022-11-08: 60 mg via ORAL
  Filled 2022-11-08: qty 2

## 2022-11-08 MED ORDER — LIDOCAINE 5 % EX PTCH: 1.0000 | MEDICATED_PATCH | CUTANEOUS | 0 refills | Status: DC

## 2022-11-08 MED ORDER — MAGNESIUM OXIDE -MG SUPPLEMENT 400 (240 MG) MG PO TABS
400.0000 mg | ORAL_TABLET | Freq: Once | ORAL | Status: AC
  Administered 2022-11-08: 400 mg via ORAL
  Filled 2022-11-08: qty 1

## 2022-11-08 MED ORDER — OXYCODONE HCL 5 MG PO TABS
10.0000 mg | ORAL_TABLET | Freq: Once | ORAL | Status: AC
  Administered 2022-11-08: 10 mg via ORAL
  Filled 2022-11-08: qty 2

## 2022-11-08 NOTE — ED Triage Notes (Signed)
Pt BIB EMS from home fall pta states "his legs gave out", c/o lower back pain

## 2022-11-08 NOTE — ED Notes (Signed)
Patient was ambulate and tolerate.

## 2022-11-08 NOTE — ED Provider Notes (Signed)
[place] EMERGENCY DEPARTMENT AT [facility] Provider Note   CSN: [id number] Arrival date & time: 11/08/22  1706     History  Chief Complaint  Patient presents with   John [name] is a 67 y.o. male.   Fall     Patient has history of hypertension, chronic back pain, polysubstance abuse, alcohol abuse, cirrhosis, reflux, seizures, sickle cell trait, chronic kidney disease, CHF.  Patient presents to the ED for evaluation of a fall, back pain.  States he has been having trouble with cough over the last couple of weeks.  Patient states today he was walking and he felt like his legs gave out.  He does not think he lost consciousness.  He is having pain in his mid and lower back now.  Patient has noticed some leg swelling.  He is not having chest pain.  Does not have any vomiting or diarrhea.  No known fevers.  He has noticed a persistent rattling in his chest.  Patient was recently seen at his cardiologist office on July 9.  Patient noted to be volume overloaded on exam.  They encouraged him to going back on his daily Lasix.  Patient was post to get an x-ray that day but he did not follow-up on that.  Home Medications Prior to Admission medications   Medication Sig Start Date End Date Taking? Authorizing Provider  lidocaine (LIDODERM) 5 % Place 1 patch onto the skin daily. Remove & Discard patch within 12 hours or as directed by MD 11/08/22  Yes Linwood Dibbles, MD  acetaminophen (TYLENOL) 325 MG tablet Take 650 mg by mouth every 8 (eight) hours as needed for moderate pain (NOT TO EXCEED 2,000 MG/DAY).    [provider]  allopurinol (ZYLOPRIM) 100 MG tablet Take 1 tablet (100 mg total) by mouth daily. 05/12/18   Rodolph Bong, MD  atorvastatin (LIPITOR) 40 MG tablet Take 40 mg by mouth daily. 04/29/19   [provider]  brimonidine (ALPHAGAN) 0.15 % ophthalmic solution Place 1 drop into both eyes at bedtime.    [provider]   carvedilol (COREG) 3.125 MG tablet Take 1 tablet (3.125 mg total) by mouth 2 (two) times daily with a meal. 08/01/22   Azucena Fallen, MD  empagliflozin (JARDIANCE) 10 MG TABS tablet Take 1 tablet (10 mg total) by mouth daily before breakfast. 11/05/22   Meriam Sprague, MD  empagliflozin (JARDIANCE) 10 MG TABS tablet Take 1 tablet (10 mg total) by mouth daily before breakfast. 11/05/22   Meriam Sprague, MD  ferrous sulfate 325 (65 FE) MG EC tablet Take 325 mg by mouth every Monday, Wednesday, and Friday.    [provider]  FLOVENT HFA 220 MCG/ACT inhaler Inhale 2 puffs into the lungs in the morning and at bedtime. 01/27/19   [provider]  furosemide (LASIX) 20 MG tablet Take 3 tablets (60 mg total) by mouth 2 (two) times daily. 11/05/22   Meriam Sprague, MD  ketoconazole (NIZORAL) 2 % shampoo Apply 1 application  topically 2 (two) times a week.    [provider]  melatonin 5 MG TABS Take 5 mg by mouth at bedtime.    [provider]  Menthol-Methyl Salicylate (SALONPAS PAIN RELIEF PATCH) PTCH Apply 1 patch topically daily as needed (for pain- affected area).    [provider]  omeprazole (PRILOSEC) 40 MG capsule TAKE ONE CAPSULE BY MOUTH DAILY Patient taking differently: Take 40 mg  by mouth daily before breakfast. 07/02/18   Meryl Dare, MD  potassium chloride (KLOR-CON) 10 MEQ tablet Take 10 mEq by mouth daily.    [provider]  PROAIR HFA 108 [phone] Base) MCG/ACT inhaler Inhale 2 puffs into the lungs every 6 (six) hours as needed for wheezing or shortness of breath. 10/02/17   [provider]  spironolactone (ALDACTONE) 50 MG tablet Take 1 tablet (50 mg total) by mouth daily. 08/01/22   Azucena Fallen, MD  terbinafine (LAMISIL) 1 % cream Apply 1 Application topically See admin instructions. Apply to feet as directed every other day    [provider]      Allergies    Penicillins    Review of  Systems   Review of Systems  Physical Exam Updated Vital Signs BP (!) 125/93   Pulse 77   Temp 98 F (36.7 C) (Oral)   Resp (!) 22   Ht 1.727 m (5\' 8" )   Wt 102 kg   SpO2 99%   BMI 34.19 kg/m  Physical Exam Vitals and nursing note reviewed.  Constitutional:      Appearance: He is well-developed. He is not diaphoretic.  HENT:     Head: Normocephalic and atraumatic.     Right Ear: External ear normal.     Left Ear: External ear normal.  Eyes:     General: No scleral icterus.       Right eye: No discharge.        Left eye: No discharge.     Conjunctiva/sclera: Conjunctivae normal.  Neck:     Trachea: No tracheal deviation.  Cardiovascular:     Rate and Rhythm: Normal rate and regular rhythm.  Pulmonary:     Effort: Pulmonary effort is normal. No respiratory distress.     Breath sounds: No stridor. Rhonchi present. No wheezing or rales.  Abdominal:     General: Bowel sounds are normal. There is no distension.     Palpations: Abdomen is soft.     Tenderness: There is no abdominal tenderness. There is no guarding or rebound.  Musculoskeletal:        General: No deformity.     Cervical back: Normal and neck supple.     Thoracic back: Tenderness present.     Lumbar back: Tenderness present.     Right lower leg: Edema present.     Left lower leg: Edema present.  Skin:    General: Skin is warm and dry.     Findings: No rash.  Neurological:     General: No focal deficit present.     Mental Status: He is alert.     Cranial Nerves: No cranial nerve deficit, dysarthria or facial asymmetry.     Sensory: No sensory deficit.     Motor: No abnormal muscle tone or seizure activity.     Coordination: Coordination normal.  Psychiatric:        Mood and Affect: Mood normal.     ED Results / Procedures / Treatments   Labs (all labs ordered are listed, but only abnormal results are displayed) Labs Reviewed  MAGNESIUM - Abnormal; Notable for the following components:       Result Value   Magnesium 1.6 (*)    All other components within normal limits  BRAIN NATRIURETIC PEPTIDE - Abnormal; Notable for the following components:   B Natriuretic Peptide 493.1 (*)    All other components within normal limits  CBC - Abnormal; Notable for  the following components:   RBC 3.79 (*)    Hemoglobin 9.0 (*)    HCT 29.6 (*)    MCV 78.1 (*)    MCH 23.7 (*)    RDW 19.0 (*)    Platelets 121 (*)    All other components within normal limits  COMPREHENSIVE METABOLIC PANEL - Abnormal; Notable for the following components:   Creatinine, Ser 1.65 (*)    Calcium 8.6 (*)    Total Protein 8.4 (*)    Total Bilirubin 1.8 (*)    GFR, Estimated 46 (*)    All other components within normal limits    EKG None  Radiology DG Lumbar Spine Complete  Result Date: 11/08/2022 CLINICAL DATA:  Fall with back pain EXAM: LUMBAR SPINE - COMPLETE 4+ VIEW COMPARISON:  MRI 12/25/2016 FINDINGS: Mild levoscoliosis. Trace retrolisthesis L2 on L3. Grade 1 anterolisthesis L5 on S1. Vertebral body heights are maintained. Mild disc space narrowing L1-L2. Moderate disc space narrowing L5-S1. Prominent lower lumbar facet degenerative change. Aortic atherosclerosis IMPRESSION: Mild levoscoliosis with multilevel degenerative change. No acute osseous abnormality Electronically Signed   By: Jasmine Pang M.D.   On: 11/08/2022 18:55   DG Thoracic Spine 2 View  Result Date: 11/08/2022 CLINICAL DATA:  Fall with back pain EXAM: THORACIC SPINE 2 VIEWS COMPARISON:  None Available. FINDINGS: There is no evidence of thoracic spine fracture. Alignment is normal. No other significant bone abnormalities are identified. Embolization coils in the left upper quadrant. IMPRESSION: Negative. Electronically Signed   By: Jasmine Pang M.D.   On: 11/08/2022 18:53   DG Chest 2 View  Result Date: 11/08/2022 CLINICAL DATA:  Fall cough congestion EXAM: CHEST - 2 VIEW COMPARISON:  08/05/2022 FINDINGS: Mild cardiomegaly. No acute  airspace disease or pleural effusion. Aortic atherosclerosis. No pneumothorax IMPRESSION: Mild cardiomegaly. Electronically Signed   By: Jasmine Pang M.D.   On: 11/08/2022 18:52    Procedures Procedures    Medications Ordered in ED Medications  oxyCODONE (Oxy IR/ROXICODONE) immediate release tablet 10 mg (10 mg Oral Given 11/08/22 1903)  magnesium oxide (MAG-OX) tablet 400 mg (400 mg Oral Given 11/08/22 2014)  furosemide (LASIX) tablet 60 mg (60 mg Oral Given 11/08/22 2014)    ED Course/ Medical Decision Making/ A&P Clinical Course as of 11/08/22 2031  Fri Nov 08, 2022  1918 CBC(!) Anemia stable similar compared to previous [JK]  1918 Lumbar and thoracic spine without signs of fracture [JK]  1918 Chest x-ray shows mild cardiomegaly [JK]  1959 Brain natriuretic peptide (order ONLY if patient c/o SOB)(!) BNP elevated, similar to previous [JK]    Clinical Course User Index [JK] Linwood Dibbles, MD                             Medical Decision Making Problems Addressed: Acute low back pain without sciatica, unspecified back pain laterality: acute illness or injury that poses a threat to life or bodily functions Fall, initial encounter: acute illness or injury Peripheral edema: chronic illness or injury with exacerbation, progression, or side effects of treatment  Amount and/or Complexity of Data Reviewed Labs: ordered. Decision-making details documented in ED Course. Radiology: ordered and independent interpretation performed.  Risk OTC drugs. Prescription drug management.   Patient presented to the ED for evaluation after fall.  Patient felt like his legs gave out.  No focal deficits noted on exam in the ED.  Patient did not lose consciousness.  He has normal  strength and sensation.  Patient does have notable peripheral edema but this appears to be a chronic finding and his chest x-ray does not show signs of pulmonary edema.  His BNP is stable compared to previous.  Suspect this is  related to his chronic CHF issues.  Patient does admit to not taking his Lasix yesterday or today.  X-rays do not show any signs of serious injury.  No signs of severe dehydration or new anemia.  Patient's magnesium level was low so he was given a dose of magnesium.  He was also given a dose of his Lasix for his peripheral edema.  Patient's pain medications improved with oral meds.  He was able to ambulate around the emergency room.  Evaluation and diagnostic testing in the emergency department does not suggest an emergent condition requiring admission or immediate intervention beyond what has been performed at this time.  The patient is safe for discharge and has been instructed to return immediately for worsening symptoms, change in symptoms or any other concerns.         Final Clinical Impression(s) / ED Diagnoses Final diagnoses:  Fall, initial encounter  Peripheral edema  Acute low back pain without sciatica, unspecified back pain laterality    Rx / DC Orders ED Discharge Orders          Ordered    lidocaine (LIDODERM) 5 %  Every 24 hours        11/08/22 2029              Linwood Dibbles, MD 11/08/22 2033

## 2022-11-08 NOTE — Discharge Instructions (Signed)
Make sure you take all your medications including your diuretics.  Follow-up with your heart doctor as planned.  Try the lidocaine patches to help with pain and discomfort in your lower back

## 2022-11-12 ENCOUNTER — Telehealth: Payer: Self-pay | Admitting: *Deleted

## 2022-11-12 DIAGNOSIS — N1832 Chronic kidney disease, stage 3b: Secondary | ICD-10-CM

## 2022-11-12 DIAGNOSIS — I5033 Acute on chronic diastolic (congestive) heart failure: Secondary | ICD-10-CM

## 2022-11-12 DIAGNOSIS — Z79899 Other long term (current) drug therapy: Secondary | ICD-10-CM

## 2022-11-12 DIAGNOSIS — R7989 Other specified abnormal findings of blood chemistry: Secondary | ICD-10-CM

## 2022-11-12 NOTE — Telephone Encounter (Signed)
Left John [name] a message to call the office back.    Advised her in the message to call the office back and tell the operators that she is returning my call back, so that they can connect her to me.   This is to arrange the pts 4 week follow-up appt with an APP in our office.

## 2022-11-12 NOTE — Telephone Encounter (Signed)
The patient has been notified of the result and verbalized understanding.  All questions (if any) were answered.  Pt states he is not gaining weight at this time and is urinating frequently since starting back his diuretic regimen.  Weight gain parameters reviewed with the pt and when to call to report those parameters to our office as needed.  He did confirm with me that he is taking all his cardiac medications as advised at last weeks OV and he has resumed back taking his lasix 60 mg po bid as we advised him to restart back doing at last weeks visit.  He did confirm with me that he monitors his weights daily, and will report to our office as needed.  He is aware to notify us if he's not losing weight or notices a spike.  Informed the pt that Dr. Shari Prows wants him to come in for repeat labs in 2 weeks (to check BNP/BMET) and to see an APP in 4 weeks vs his scheduled appt in October.   Pt stated to me that I will need to call [facility] to arrange all his appointments with our office, for they provide him transportation to and from appts.  Pt gave me the number to [facility] at [phone] and direct transportation scheduler is Shanda Bumps at [phone].    Called [facility] to arrange his 2 week lab appt with our office and 4 week follow-up appt with an Extender in our office.  Spoke with Shanda Bumps at [facility] about this and arranged to have his lab appt scheduled with our office for 11/28/22, for she said they are not able to get him here on the 29th/30th/31st because that is dental transportation day and soonest they could get him here for lab would be on 8/1.  Lab appt for 8/1 scheduled for the pt and [facility] will bring him then.  Shanda Bumps with [name] said as far as the 4 week follow-up with an APP in our office, they can only transport him here in the morning times only.  Informed Shanda Bumps that unfortunately we have no AM appts with the Extenders, and I will have to get permission from Dennis Bast RN Supervisor  first, before I can schedule a morning slot appt on an Extenders schedule that is not designated for follow-up visits.  Informed Shanda Bumps that I will go and speak with my Supervisor now about this request and call her back here shortly with what she says.  Shanda Bumps said I can directly call her back at [phone].  Shanda Bumps agreed with this plan and will await my return call back.

## 2022-11-12 NOTE — Telephone Encounter (Signed)
Was able to make contact with Shanda Bumps with [facility] about scheduling pts 4-5 week follow-up appt with our office to see an APP.   Was able to make an appt for the pt to see Jacolyn Reedy PA-C in 4-5 weeks on 12/17/22 at 1:15 pm.  She was advised to have pt transported on this date/time and have him here at least 15 mins prior to this appt.   Scheduled 8/20 at 1:15 pm appt with Jacolyn Reedy PA-C and noted in appt notes that covering team that day will need to call [facility] at [phone] to come and pick the pt up from that visit, and if any additional appts need to be made from that visit, to call Shanda Bumps with [facility] at [phone] to arrange appt/transportation.   Shanda Bumps verbalized understanding and agrees with this plan.  She has both the 8/1 lab appt and 8/20 OV appt with our office, on the books for the pt.

## 2022-11-12 NOTE — Telephone Encounter (Signed)
-----   Message from Meriam Sprague sent at 11/06/2022 12:59 PM EDT ----- Fluid levels are very elevated. Kidney function and electrolytes are stable. We need to ensure he is taking the lasix 60mg  BID and watching his weight and ensure it is going down. If he is not losing weight, let us know and we can adjust his diuretic.   Can we move up his follow-up appointment to 4 week follow-up to ensure his volume status is improving? He is really tenuous. Would also repeat BNP and BMET in 2 weeks to ensure his fluid levels are improving.

## 2022-11-26 ENCOUNTER — Encounter (HOSPITAL_COMMUNITY): Payer: Self-pay | Admitting: *Deleted

## 2022-11-27 ENCOUNTER — Encounter (HOSPITAL_COMMUNITY): Payer: Self-pay | Admitting: *Deleted

## 2022-11-28 ENCOUNTER — Ambulatory Visit: Payer: Medicare (Managed Care) | Attending: Cardiology

## 2022-11-28 DIAGNOSIS — I5033 Acute on chronic diastolic (congestive) heart failure: Secondary | ICD-10-CM

## 2022-11-28 DIAGNOSIS — N1832 Chronic kidney disease, stage 3b: Secondary | ICD-10-CM

## 2022-11-28 DIAGNOSIS — R7989 Other specified abnormal findings of blood chemistry: Secondary | ICD-10-CM

## 2022-11-28 DIAGNOSIS — Z79899 Other long term (current) drug therapy: Secondary | ICD-10-CM

## 2022-11-28 LAB — BASIC METABOLIC PANEL
BUN/Creatinine Ratio: 13 (ref 10–24)
BUN: 23 mg/dL (ref 8–27)
CO2: 24 mmol/L (ref 20–29)
Calcium: 9.9 mg/dL (ref 8.6–10.2)
Chloride: 101 mmol/L (ref 96–106)
Creatinine, Ser: 1.83 mg/dL — ABNORMAL HIGH (ref 0.76–1.27)
Glucose: 88 mg/dL (ref 70–99)
Potassium: 4.3 mmol/L (ref 3.5–5.2)
Sodium: 137 mmol/L (ref 134–144)
eGFR: 40 mL/min/{1.73_m2} — ABNORMAL LOW (ref >59)

## 2022-11-28 LAB — PRO B NATRIURETIC PEPTIDE

## 2022-11-29 ENCOUNTER — Telehealth: Payer: Self-pay | Admitting: Cardiology

## 2022-11-29 DIAGNOSIS — I5030 Unspecified diastolic (congestive) heart failure: Secondary | ICD-10-CM

## 2022-11-29 NOTE — Telephone Encounter (Signed)
Quintella Reichert, MD  P [facility] Triage Cc: Loa Socks, LPN BNP is now higher than it was in the hospital, renal function has worsened and at last office visit with Dr. Shari Prows weight was up.  I think he needs to go back to the hospital for more diuresis and to get advanced heart failure to see him.   Left the pt on both phone numbers, to call the office back to endorse to him results per DOD Dr. Mayford Knife.

## 2022-11-29 NOTE — Telephone Encounter (Signed)
Calling to see if any changes need to be made after patient labs on yesterday. Please advise

## 2022-11-29 NOTE — Telephone Encounter (Signed)
Left a message on voicemail of Lequita Halt at [facility].   On the message I mentioned that the pts labs have worsened as far as fluid status  and renal function, and Dr. Mayford Knife DOD recommends that he go to the hospital to have this safely managed.  Also endorsed on the voicemail that we have tried calling the pt several times with no answer.  Did leave him a message to call our office back before the end of the business day.  Aquilla Hacker to call back with any additional questions.

## 2022-11-29 NOTE — Telephone Encounter (Signed)
  Loa Socks, LPN 11/30/6960  9:52 PM EDT Back to Top    Tried calling the pt several times with no success in getting in contact with him.  Was able to leave a message on his preferred number to call our office back Monday 8/5 and ask for a triage nurse, to assist him in getting these results.   Left a message for Lequita Halt at [facility] to call us back as well to get the pts results, and to see if she could help [place] in getting in contact with the pt.   Also tried calling emergency contact numbers, and one number is disconnected and the other number stated they do not know the pt.   Will send this to our triage pool for further follow-up on the pt on Monday morning, being I will be out of the office all next week.

## 2022-11-29 NOTE — Telephone Encounter (Signed)
Left a detailed message on Lequita Halt with [facility]'s voicemail, that I am waiting on our DOD Dr. Mayford Knife to review and advise on the pts lab results (being Dr. Shari Prows is no longer with our practice).    Left Lequita Halt a detailed message that as soon as Dr. Mayford Knife reviews and advises on his lab results, I will call her shortly thereafter with the results and any necessary medication changes, based on the results.   Will also forward this call to Dr. Mayford Knife as an Lorain Childes.

## 2022-12-02 NOTE — Telephone Encounter (Signed)
Tried to call Pt but phone seems to be turned off.  Called [facility]. Spoke with Lequita Halt at [facility]. Informed her we could not get in touch with the pt about labs and our Dr recommends he go to the hospital. She informed me she had gotten the voice message and had seen Pt  Thursday of last week and he was "stable". Stated the pt has a prepaid phone and is very hard to get a hold of. He lives with family and that he will not answer a phone call before 11 am when his phone is on. She stated she has tried to speak with him several times about goals of care and his prognosis. She said he is normally good about going "into the clinic or ED" if he "feels off". Lequita Halt stated she is at Dr Leticia Penna office at the number [phone] and that is easier number to reach her at during office hours. Lequita Halt stated they are following him and his care.

## 2022-12-04 NOTE — Telephone Encounter (Addendum)
Attempted phone call to pt.  Message states call cannot be completed at this time.  Amb referral placed for [facility].

## 2022-12-04 NOTE — Telephone Encounter (Signed)
Call to patient's niece John [name] who is listed on DPR. [name] gave patient's current phone # [phone]). Call to patient, reviewed that our staff had been trying to reach him to advise him to go to ED. Patient agrees with plan and states he will go to [facility]. Updated chart with current phone number.

## 2022-12-04 NOTE — Telephone Encounter (Signed)
Attempted phone call to pt on mobile number and home number- message states call cannot be completed at this time and subscriber is not available.

## 2022-12-06 NOTE — Progress Notes (Deleted)
Cardiology Office Note:  .   Date:  12/06/2022  ID:  John [name], DOB November 27, 1955, MRN [id number] PCP: Angela Cox, MD (Inactive)   HeartCare Providers Cardiologist:  None { Click to update primary MD,subspecialty MD or APP then REFRESH:1}   History of Present Illness: John [name] Kitchen   John [name] is a 67 y.o. male  with history of alcoholic cirrhosis, hepatitis C, history of upper GIB, COPD, CKD 3B, HFpEF, polysubstance abuse and HTN who was referred by Dr. Leticia Penna for further evaluation of diastolic HF.   Patient hospitalized at [facility] in 2022-09-08 with worsening volume overload thought to be mixed in etiology in the setting of acute on chronic diastolic HF, cirrhosis and CKD. TTE with EF 55-60%, normal RV, mild PHTN, no significant valve disease, Aorta 40mm. Was aggressively diuresed with improvement and now referred to Cardiology for further evaluation.  Dr. Mayford Knife recommended patient go to ER 12/04/22 based on labs but they couldn't get in touch with patient.  ROS: ***  Studies Reviewed: John [name] Kitchen         Prior CV Studies: {Select studies to display:26339}   Echo 09-08-22 IMPRESSIONS     1. Left ventricular ejection fraction, by estimation, is 55 to 60%. The  left ventricle has normal function. The left ventricle has no regional  wall motion abnormalities. There is mild concentric left ventricular  hypertrophy. Left ventricular diastolic  parameters are indeterminate.   2. Right ventricular systolic function is normal. The right ventricular  size is normal. There is mildly elevated pulmonary artery systolic  pressure.   3. Left atrial size was severely dilated.   4. Right atrial size was mildly dilated.   5. The mitral valve is normal in structure. No evidence of mitral valve  regurgitation. No evidence of mitral stenosis.   6. The aortic valve is normal in structure. Aortic valve regurgitation is  not visualized. No aortic stenosis is present.   7. Aortic dilatation  noted. There is mild dilatation of the aortic root,  measuring 41 mm. There is mild dilatation of the ascending aorta,  measuring 40 mm.   8. The inferior vena cava is dilated in size with <50% respiratory  variability, suggesting right atrial pressure of 15 mmHg.    Risk Assessment/Calculations:   {Does this patient have ATRIAL FIBRILLATION?:[id number]} No BP recorded.  {Refresh Note OR Click here to enter BP  :1}***       Physical Exam:   VS:  There were no vitals taken for this visit.   Wt Readings from Last 3 Encounters:  11/08/22 224 lb 13.9 oz (102 kg)  11/05/22 227 lb (103 kg)  08/01/22 222 lb 8 oz (100.9 kg)    GEN: Well nourished, well developed in no acute distress NECK: No JVD; No carotid bruits CARDIAC: ***RRR, no murmurs, rubs, gallops RESPIRATORY:  Clear to auscultation without rales, wheezing or rhonchi  ABDOMEN: Soft, non-tender, non-distended EXTREMITIES:  No edema; No deformity   ASSESSMENT AND PLAN: .   Acute on Chronic Diastolic HF: -TTE with LVEF 55-60%, normal RV, mild pHTN, no significant valve disease -Has had multiple admissions for volume overload likely multifactorial in nature in the setting of HFpEF, cirrhosis and CKD -Currently, volume overloaded on exam with elevated JVD and LE edema but has only been taking lasix 60mg  daily instead of BID -Check BMET and BNP -Check CXR as patient is diffusely rhonchorous on exam today -Discussed importance on going back on lasix 60mg  PO BID -Continue spironolactone  50mg  daily -Start jardiance 10mg  daily -Discussed abstinence from crack/cocaine   #Dyspnea: -Patient very rhonchorous on exam today and reports he has been having URI symptoms -Also volume overloaded in the setting of medication noncompliance -Suspect this may be related to volume, but will check CXR to ensure no PNA   #HTN: -Controlled today <130/90 -Continue coreg 3.125mg  BID -Continue spironolactone 50mg  daily   #HLD: -Continue lipitor 40mg   daily   #Alcoholic Cirrhosis: -Management per GI -Continue lasix and spiro as prescribed   #Polysubstance Abuse: -Encouraged cessation             {Are you ordering a CV Procedure (e.g. stress test, cath, DCCV, TEE, etc)?   Press F2        :[id number]}  Dispo: ***  Signed, Jacolyn Reedy, PA-C

## 2022-12-10 ENCOUNTER — Encounter (HOSPITAL_COMMUNITY): Payer: Self-pay | Admitting: *Deleted

## 2022-12-17 ENCOUNTER — Ambulatory Visit: Payer: Medicare (Managed Care) | Attending: Physician Assistant | Admitting: Physician Assistant

## 2023-01-09 ENCOUNTER — Encounter (HOSPITAL_COMMUNITY): Payer: Self-pay | Admitting: *Deleted

## 2023-01-28 ENCOUNTER — Encounter (HOSPITAL_COMMUNITY): Payer: Self-pay | Admitting: *Deleted

## 2023-02-03 NOTE — Progress Notes (Deleted)
Cardiology Office Note:    Date:  02/03/2023  ID:  John [name], DOB Jan 18, 1956, MRN [id number] PCP: Angela Cox, MD (Inactive)  [facility] Providers Cardiologist:  None { Click to update primary MD,subspecialty MD or APP then REFRESH:1}    {Click to Open Review  :1}   Patient Profile:      (HFpEF) heart failure with preserved ejection fraction  TTE 08/01/2022: EF 55-60, no RWMA, mild LVH, normal RVSF, mildly elevated PASP, severe LAE, mild RAE, mild dilation of aortic root (41 mm), mild dilation of ascending aorta (40 mm), RAP 15 Hypertension Chronic kidney disease Alcoholic cirrhosis with esophageal varices Hep C Nonobstructive coronary artery disease (per history - no documented cath in chart) Peripheral arterial disease (doc in history; no documented angiogram) Renal cell CA Hx of CVA Carotid US 3-16: Bilateral ICA 1-39 Sickle cell trait Polysubstance abuse (ETOH, cocaine/crack, THC) History of upper GI bleed Chronic Obstructive Pulmonary Disease       {      :1}   History of Present Illness:  Discussed the use of AI scribe software for clinical note transcription with the patient, who gave verbal consent to proceed.  John [name] is a 67 y.o. male who returns for follow up of CHF. He was evaluated by Dr. Shari Prows 11/05/2022 CHF.  Her note was reviewed.  The patient was still actively using crack cocaine at that time (last use-day before appointment).  The patient has had multiple admissions for volume overload felt to likely be multifactorial in the setting of HFpEF, cirrhosis, CKD.  His furosemide was increased back to 60 mg twice daily and he was placed on Jardiance 10 mg daily.           ROS   See HPI ***    Studies Reviewed:       *** Risk Assessment/Calculations:   {Does this patient have ATRIAL FIBRILLATION?[phone]} No BP recorded.  {Refresh Note OR Click here to enter BP  :1}***       Physical Exam:   VS:  There were no  vitals taken for this visit.   Wt Readings from Last 3 Encounters:  11/08/22 224 lb 13.9 oz (102 kg)  11/05/22 227 lb (103 kg)  08/01/22 222 lb 8 oz (100.9 kg)    Physical Exam***     Assessment and Plan:   Assessment & Plan Chronic heart failure with preserved ejection fraction (HCC)  Essential hypertension, benign  Stage 3b chronic kidney disease (HCC)   Assessment and Plan             {      :1}    {Are you ordering a CV Procedure (e.g. stress test, cath, DCCV, TEE, etc)?   Press F2        :[id number]}  Dispo:  No follow-ups on file.  Signed, Tereso Newcomer, PA-C

## 2023-02-04 ENCOUNTER — Ambulatory Visit: Payer: Medicare HMO | Attending: Physician Assistant | Admitting: Physician Assistant

## 2023-02-04 DIAGNOSIS — I5032 Chronic diastolic (congestive) heart failure: Secondary | ICD-10-CM

## 2023-02-04 DIAGNOSIS — I10 Essential (primary) hypertension: Secondary | ICD-10-CM

## 2023-02-04 DIAGNOSIS — N1832 Chronic kidney disease, stage 3b: Secondary | ICD-10-CM

## 2023-03-25 ENCOUNTER — Ambulatory Visit (HOSPITAL_COMMUNITY)
Admission: RE | Admit: 2023-03-25 | Discharge: 2023-03-25 | Disposition: A | Discharge status: Home / Self Care | Payer: Medicare HMO | Source: Ambulatory Visit | Attending: Cardiology | Admitting: Cardiology

## 2023-03-25 DIAGNOSIS — I5033 Acute on chronic diastolic (congestive) heart failure: Secondary | ICD-10-CM | POA: Diagnosis present

## 2023-05-07 ENCOUNTER — Inpatient Hospital Stay (HOSPITAL_COMMUNITY)
Admission: EM | Admit: 2023-05-07 | Discharge: 2023-05-14 | DRG: 432 | Disposition: A | Discharge status: Home / Self Care | Payer: No Typology Code available for payment source | Attending: Internal Medicine | Admitting: Internal Medicine

## 2023-05-07 ENCOUNTER — Emergency Department (HOSPITAL_COMMUNITY): Payer: No Typology Code available for payment source

## 2023-05-07 ENCOUNTER — Encounter (HOSPITAL_COMMUNITY): Payer: Self-pay | Admitting: Emergency Medicine

## 2023-05-07 ENCOUNTER — Encounter (HOSPITAL_COMMUNITY): Payer: Self-pay | Admitting: *Deleted

## 2023-05-07 ENCOUNTER — Other Ambulatory Visit: Payer: Self-pay

## 2023-05-07 DIAGNOSIS — I8510 Secondary esophageal varices without bleeding: Secondary | ICD-10-CM | POA: Diagnosis present

## 2023-05-07 DIAGNOSIS — F1011 Alcohol abuse, in remission: Secondary | ICD-10-CM | POA: Diagnosis present

## 2023-05-07 DIAGNOSIS — K7290 Hepatic failure, unspecified without coma: Secondary | ICD-10-CM

## 2023-05-07 DIAGNOSIS — I959 Hypotension, unspecified: Secondary | ICD-10-CM | POA: Diagnosis present

## 2023-05-07 DIAGNOSIS — R569 Unspecified convulsions: Secondary | ICD-10-CM | POA: Diagnosis present

## 2023-05-07 DIAGNOSIS — M109 Gout, unspecified: Secondary | ICD-10-CM | POA: Diagnosis present

## 2023-05-07 DIAGNOSIS — I4892 Unspecified atrial flutter: Secondary | ICD-10-CM | POA: Diagnosis present

## 2023-05-07 DIAGNOSIS — D638 Anemia in other chronic diseases classified elsewhere: Secondary | ICD-10-CM | POA: Diagnosis present

## 2023-05-07 DIAGNOSIS — E1122 Type 2 diabetes mellitus with diabetic chronic kidney disease: Secondary | ICD-10-CM | POA: Diagnosis present

## 2023-05-07 DIAGNOSIS — K766 Portal hypertension: Secondary | ICD-10-CM | POA: Diagnosis present

## 2023-05-07 DIAGNOSIS — F1210 Cannabis abuse, uncomplicated: Secondary | ICD-10-CM | POA: Diagnosis present

## 2023-05-07 DIAGNOSIS — Z7151 Drug abuse counseling and surveillance of drug abuser: Secondary | ICD-10-CM

## 2023-05-07 DIAGNOSIS — T501X6A Underdosing of loop [high-ceiling] diuretics, initial encounter: Secondary | ICD-10-CM | POA: Diagnosis present

## 2023-05-07 DIAGNOSIS — Z79899 Other long term (current) drug therapy: Secondary | ICD-10-CM

## 2023-05-07 DIAGNOSIS — I509 Heart failure, unspecified: Secondary | ICD-10-CM | POA: Diagnosis not present

## 2023-05-07 DIAGNOSIS — G8929 Other chronic pain: Secondary | ICD-10-CM | POA: Diagnosis present

## 2023-05-07 DIAGNOSIS — K7031 Alcoholic cirrhosis of liver with ascites: Principal | ICD-10-CM | POA: Diagnosis present

## 2023-05-07 DIAGNOSIS — D509 Iron deficiency anemia, unspecified: Secondary | ICD-10-CM | POA: Diagnosis present

## 2023-05-07 DIAGNOSIS — I2510 Atherosclerotic heart disease of native coronary artery without angina pectoris: Secondary | ICD-10-CM | POA: Diagnosis present

## 2023-05-07 DIAGNOSIS — F1410 Cocaine abuse, uncomplicated: Secondary | ICD-10-CM | POA: Diagnosis present

## 2023-05-07 DIAGNOSIS — Z87891 Personal history of nicotine dependence: Secondary | ICD-10-CM

## 2023-05-07 DIAGNOSIS — Z8619 Personal history of other infectious and parasitic diseases: Secondary | ICD-10-CM

## 2023-05-07 DIAGNOSIS — D696 Thrombocytopenia, unspecified: Secondary | ICD-10-CM | POA: Diagnosis not present

## 2023-05-07 DIAGNOSIS — I4891 Unspecified atrial fibrillation: Secondary | ICD-10-CM | POA: Diagnosis present

## 2023-05-07 DIAGNOSIS — M549 Dorsalgia, unspecified: Secondary | ICD-10-CM | POA: Diagnosis present

## 2023-05-07 DIAGNOSIS — N1832 Chronic kidney disease, stage 3b: Secondary | ICD-10-CM | POA: Diagnosis present

## 2023-05-07 DIAGNOSIS — Z860101 Personal history of adenomatous and serrated colon polyps: Secondary | ICD-10-CM

## 2023-05-07 DIAGNOSIS — M47819 Spondylosis without myelopathy or radiculopathy, site unspecified: Secondary | ICD-10-CM | POA: Diagnosis present

## 2023-05-07 DIAGNOSIS — K429 Umbilical hernia without obstruction or gangrene: Secondary | ICD-10-CM | POA: Diagnosis present

## 2023-05-07 DIAGNOSIS — D61818 Other pancytopenia: Secondary | ICD-10-CM | POA: Diagnosis present

## 2023-05-07 DIAGNOSIS — H269 Unspecified cataract: Secondary | ICD-10-CM | POA: Diagnosis present

## 2023-05-07 DIAGNOSIS — H469 Unspecified optic neuritis: Secondary | ICD-10-CM | POA: Diagnosis present

## 2023-05-07 DIAGNOSIS — E119 Type 2 diabetes mellitus without complications: Secondary | ICD-10-CM | POA: Diagnosis not present

## 2023-05-07 DIAGNOSIS — Z981 Arthrodesis status: Secondary | ICD-10-CM

## 2023-05-07 DIAGNOSIS — D6959 Other secondary thrombocytopenia: Secondary | ICD-10-CM | POA: Diagnosis present

## 2023-05-07 DIAGNOSIS — Z800 Family history of malignant neoplasm of digestive organs: Secondary | ICD-10-CM

## 2023-05-07 DIAGNOSIS — J441 Chronic obstructive pulmonary disease with (acute) exacerbation: Secondary | ICD-10-CM

## 2023-05-07 DIAGNOSIS — Z7984 Long term (current) use of oral hypoglycemic drugs: Secondary | ICD-10-CM

## 2023-05-07 DIAGNOSIS — I130 Hypertensive heart and chronic kidney disease with heart failure and stage 1 through stage 4 chronic kidney disease, or unspecified chronic kidney disease: Secondary | ICD-10-CM | POA: Diagnosis present

## 2023-05-07 DIAGNOSIS — Z91148 Patient's other noncompliance with medication regimen for other reason: Secondary | ICD-10-CM

## 2023-05-07 DIAGNOSIS — Z5941 Food insecurity: Secondary | ICD-10-CM

## 2023-05-07 DIAGNOSIS — Z555 Less than a high school diploma: Secondary | ICD-10-CM

## 2023-05-07 DIAGNOSIS — Z9889 Other specified postprocedural states: Secondary | ICD-10-CM

## 2023-05-07 DIAGNOSIS — K7030 Alcoholic cirrhosis of liver without ascites: Secondary | ICD-10-CM | POA: Diagnosis not present

## 2023-05-07 DIAGNOSIS — I5030 Unspecified diastolic (congestive) heart failure: Secondary | ICD-10-CM | POA: Diagnosis present

## 2023-05-07 DIAGNOSIS — Z7951 Long term (current) use of inhaled steroids: Secondary | ICD-10-CM

## 2023-05-07 DIAGNOSIS — I77810 Thoracic aortic ectasia: Secondary | ICD-10-CM | POA: Diagnosis present

## 2023-05-07 DIAGNOSIS — Z833 Family history of diabetes mellitus: Secondary | ICD-10-CM

## 2023-05-07 DIAGNOSIS — Z841 Family history of disorders of kidney and ureter: Secondary | ICD-10-CM

## 2023-05-07 DIAGNOSIS — I700 Atherosclerosis of aorta: Secondary | ICD-10-CM | POA: Diagnosis present

## 2023-05-07 DIAGNOSIS — I5033 Acute on chronic diastolic (congestive) heart failure: Secondary | ICD-10-CM | POA: Diagnosis present

## 2023-05-07 DIAGNOSIS — K7460 Unspecified cirrhosis of liver: Secondary | ICD-10-CM | POA: Diagnosis present

## 2023-05-07 DIAGNOSIS — E669 Obesity, unspecified: Secondary | ICD-10-CM | POA: Diagnosis present

## 2023-05-07 DIAGNOSIS — E871 Hypo-osmolality and hyponatremia: Secondary | ICD-10-CM | POA: Diagnosis not present

## 2023-05-07 DIAGNOSIS — K219 Gastro-esophageal reflux disease without esophagitis: Secondary | ICD-10-CM | POA: Diagnosis present

## 2023-05-07 DIAGNOSIS — K7469 Other cirrhosis of liver: Secondary | ICD-10-CM | POA: Diagnosis not present

## 2023-05-07 DIAGNOSIS — K449 Diaphragmatic hernia without obstruction or gangrene: Secondary | ICD-10-CM | POA: Diagnosis present

## 2023-05-07 DIAGNOSIS — D573 Sickle-cell trait: Secondary | ICD-10-CM | POA: Diagnosis present

## 2023-05-07 DIAGNOSIS — Z8249 Family history of ischemic heart disease and other diseases of the circulatory system: Secondary | ICD-10-CM

## 2023-05-07 DIAGNOSIS — E1151 Type 2 diabetes mellitus with diabetic peripheral angiopathy without gangrene: Secondary | ICD-10-CM | POA: Diagnosis present

## 2023-05-07 DIAGNOSIS — Z85528 Personal history of other malignant neoplasm of kidney: Secondary | ICD-10-CM

## 2023-05-07 DIAGNOSIS — Z8616 Personal history of COVID-19: Secondary | ICD-10-CM | POA: Diagnosis not present

## 2023-05-07 DIAGNOSIS — N179 Acute kidney failure, unspecified: Secondary | ICD-10-CM | POA: Diagnosis present

## 2023-05-07 DIAGNOSIS — K3189 Other diseases of stomach and duodenum: Secondary | ICD-10-CM | POA: Diagnosis present

## 2023-05-07 DIAGNOSIS — I5032 Chronic diastolic (congestive) heart failure: Secondary | ICD-10-CM | POA: Diagnosis present

## 2023-05-07 DIAGNOSIS — Z6834 Body mass index (BMI) 34.0-34.9, adult: Secondary | ICD-10-CM

## 2023-05-07 DIAGNOSIS — I864 Gastric varices: Secondary | ICD-10-CM | POA: Diagnosis present

## 2023-05-07 DIAGNOSIS — Z8719 Personal history of other diseases of the digestive system: Secondary | ICD-10-CM

## 2023-05-07 DIAGNOSIS — Z880 Allergy status to penicillin: Secondary | ICD-10-CM

## 2023-05-07 DIAGNOSIS — M545 Low back pain, unspecified: Secondary | ICD-10-CM | POA: Diagnosis present

## 2023-05-07 DIAGNOSIS — E785 Hyperlipidemia, unspecified: Secondary | ICD-10-CM | POA: Diagnosis present

## 2023-05-07 DIAGNOSIS — D72819 Decreased white blood cell count, unspecified: Secondary | ICD-10-CM | POA: Diagnosis present

## 2023-05-07 DIAGNOSIS — Z8673 Personal history of transient ischemic attack (TIA), and cerebral infarction without residual deficits: Secondary | ICD-10-CM

## 2023-05-07 LAB — BASIC METABOLIC PANEL
Anion gap: 10 (ref 5–15)
BUN: 23 mg/dL (ref 8–23)
CO2: 22 mmol/L (ref 22–32)
Calcium: 9.2 mg/dL (ref 8.9–10.3)
Chloride: 104 mmol/L (ref 98–111)
Creatinine, Ser: 1.6 mg/dL — ABNORMAL HIGH (ref 0.61–1.24)
GFR, Estimated: 47 mL/min — ABNORMAL LOW (ref >60)
Glucose, Bld: 82 mg/dL (ref 70–99)
Potassium: 4 mmol/L (ref 3.5–5.1)
Sodium: 136 mmol/L (ref 135–145)

## 2023-05-07 LAB — CBC
HCT: 30.5 % — ABNORMAL LOW (ref 39.0–52.0)
Hemoglobin: 9.4 g/dL — ABNORMAL LOW (ref 13.0–17.0)
MCH: 24.7 pg — ABNORMAL LOW (ref 26.0–34.0)
MCHC: 30.8 g/dL (ref 30.0–36.0)
MCV: 80.3 fL (ref 80.0–100.0)
Platelets: 95 10*3/uL — ABNORMAL LOW (ref 150–400)
RBC: 3.8 MIL/uL — ABNORMAL LOW (ref 4.22–5.81)
RDW: 19 % — ABNORMAL HIGH (ref 11.5–15.5)
WBC: 3.4 10*3/uL — ABNORMAL LOW (ref 4.0–10.5)
nRBC: 0 % (ref 0.0–0.2)

## 2023-05-07 LAB — BRAIN NATRIURETIC PEPTIDE: B Natriuretic Peptide: 219.8 pg/mL — ABNORMAL HIGH (ref 0.0–100.0)

## 2023-05-07 MED ORDER — FUROSEMIDE 10 MG/ML IJ SOLN
60.0000 mg | Freq: Once | INTRAMUSCULAR | Status: AC
  Administered 2023-05-07: 60 mg via INTRAVENOUS
  Filled 2023-05-07: qty 8

## 2023-05-07 MED ORDER — IPRATROPIUM BROMIDE 0.02 % IN SOLN
0.5000 mg | Freq: Once | RESPIRATORY_TRACT | Status: AC
  Administered 2023-05-07: 0.5 mg via RESPIRATORY_TRACT
  Filled 2023-05-07: qty 2.5

## 2023-05-07 MED ORDER — ALBUTEROL SULFATE (2.5 MG/3ML) 0.083% IN NEBU
5.0000 mg | INHALATION_SOLUTION | Freq: Once | RESPIRATORY_TRACT | Status: AC
  Administered 2023-05-07: 5 mg via RESPIRATORY_TRACT
  Filled 2023-05-07: qty 6

## 2023-05-07 MED ORDER — CARVEDILOL 3.125 MG PO TABS
3.1250 mg | ORAL_TABLET | Freq: Once | ORAL | Status: AC
  Administered 2023-05-07: 3.125 mg via ORAL
  Filled 2023-05-07: qty 1

## 2023-05-07 MED ORDER — METHYLPREDNISOLONE SODIUM SUCC 125 MG IJ SOLR: 125.0000 mg | Freq: Once | INTRAMUSCULAR | Status: DC

## 2023-05-07 NOTE — ED Triage Notes (Signed)
 Pt arrives via EMS from home with SOB and fluid retention for a few days. Pt out of lasix for a week. Abd distention and wheezing. EMS gave duoneb. Denies CP. SOB with exertion.  20G LAC and 125 solu-medrol given.

## 2023-05-07 NOTE — ED Provider Notes (Addendum)
 [place] EMERGENCY DEPARTMENT AT [facility] Provider Note   CSN: [id number] Arrival date & time: 05/07/23  1522     History  Chief Complaint  Patient presents with   fluid retention    John [name] is a 68 y.o. male.  Pt is a 67y/o male with history of alcoholic cirrhosis, hepatitis C, history of upper GIB, COPD, CKD 3B, HFpEF, polysubstance abuse and HTN who is presenting today with complaints of swelling in his legs and abdomen as well as shortness of breath with exertion.  Patient reports that this has been increasing over the last few days and states that he noticed that he ran out of his diuretic and thinks that may be why this is happening.  He is unsure how long he is gone without his diuretic reports he had just not noticed that he did not have it.  He denies new cough, fever.  He has intermittent chest pain which he states has been going on for a while but will not give more details.  He denies any nausea or vomiting.  He reports initially he was having a lot of trouble breathing but after they gave him a breathing treatment he felt better.  He also reports last week he had COVID but he tolerated it well.  He has not made much urine but reports he has been eating and drinking.  He denies orthopnea.  The history is provided by the patient.       Home Medications Prior to Admission medications   Medication Sig Start Date End Date Taking? Authorizing Provider  acetaminophen  (TYLENOL ) 325 MG tablet Take 650 mg by mouth every 8 (eight) hours as needed for moderate pain (NOT TO EXCEED 2,000 MG/DAY).    [provider]  allopurinol  (ZYLOPRIM ) 100 MG tablet Take 1 tablet (100 mg total) by mouth daily. 05/12/18   Sebastian Toribio GAILS, MD  atorvastatin  (LIPITOR) 40 MG tablet Take 40 mg by mouth daily. 04/29/19   [provider]  brimonidine  (ALPHAGAN ) 0.15 % ophthalmic solution Place 1 drop into both eyes at bedtime.    [provider]   carvedilol  (COREG ) 3.125 MG tablet Take 1 tablet (3.125 mg total) by mouth 2 (two) times daily with a meal. 08/01/22   Lue Elsie BROCKS, MD  empagliflozin  (JARDIANCE ) 10 MG TABS tablet Take 1 tablet (10 mg total) by mouth daily before breakfast. 11/05/22   Hobart Powell BRAVO, MD  empagliflozin  (JARDIANCE ) 10 MG TABS tablet Take 1 tablet (10 mg total) by mouth daily before breakfast. 11/05/22   Hobart Powell BRAVO, MD  ferrous sulfate  325 (65 FE) MG EC tablet Take 325 mg by mouth every Monday, Wednesday, and Friday.    [provider]  FLOVENT  HFA 220 MCG/ACT inhaler Inhale 2 puffs into the lungs in the morning and at bedtime. 01/27/19   [provider]  furosemide  (LASIX ) 20 MG tablet Take 3 tablets (60 mg total) by mouth 2 (two) times daily. 11/05/22   Hobart Powell BRAVO, MD  ketoconazole  (NIZORAL ) 2 % shampoo Apply 1 application  topically 2 (two) times a week.    [provider]  lidocaine  (LIDODERM ) 5 % Place 1 patch onto the skin daily. Remove & Discard patch within 12 hours or as directed by MD 11/08/22   Randol Simmonds, MD  melatonin 5 MG TABS Take 5 mg by mouth at bedtime.    [provider]  Menthol -Methyl Salicylate  (SALONPAS  PAIN RELIEF  PATCH) PTCH Apply 1 patch  topically daily as needed (for pain- affected area).    [provider]  omeprazole  (PRILOSEC) 40 MG capsule TAKE ONE CAPSULE BY MOUTH DAILY Patient taking differently: Take 40 mg by mouth daily before breakfast. 07/02/18   Aneita Gwendlyn DASEN, MD  potassium chloride  (KLOR-CON ) 10 MEQ tablet Take 10 mEq by mouth daily.    [provider]  PROAIR  HFA 108 (90 Base) MCG/ACT inhaler Inhale 2 puffs into the lungs every 6 (six) hours as needed for wheezing or shortness of breath. 10/02/17   [provider]  spironolactone  (ALDACTONE ) 50 MG tablet Take 1 tablet (50 mg total) by mouth daily. 08/01/22   Lue Elsie BROCKS, MD  terbinafine (LAMISIL) 1 % cream Apply 1 Application topically  See admin instructions. Apply to feet as directed every other day    [provider]      Allergies    Penicillins    Review of Systems   Review of Systems  Physical Exam Updated Vital Signs BP (!) 134/102   Pulse (!) 107   Temp 98 F (36.7 C) (Oral)   Resp 15   Ht 5' 8 (1.727 m)   Wt 102 kg   SpO2 97%   BMI 34.19 kg/m  Physical Exam Vitals and nursing note reviewed.  Constitutional:      General: He is not in acute distress.    Appearance: He is well-developed.  HENT:     Head: Normocephalic and atraumatic.     Mouth/Throat:     Mouth: Mucous membranes are moist.  Eyes:     Conjunctiva/sclera: Conjunctivae normal.     Pupils: Pupils are equal, round, and reactive to light.  Neck:     Comments: JVD present when patient is at 65 degrees Cardiovascular:     Rate and Rhythm: Regular rhythm. Tachycardia present.     Heart sounds: No murmur heard. Pulmonary:     Effort: Pulmonary effort is normal. No respiratory distress.     Breath sounds: Wheezing present. No rales.     Comments: Scant wheezes in the upper lobes Abdominal:     General: There is distension.     Palpations: Abdomen is soft.     Tenderness: There is no abdominal tenderness. There is no guarding or rebound.  Musculoskeletal:        General: No tenderness. Normal range of motion.     Cervical back: Normal range of motion and neck supple.     Right lower leg: Edema present.     Left lower leg: Edema present.     Comments: 2+ pitting edema bilateral lower extremities without any weeping  Skin:    General: Skin is warm and dry.     Findings: No erythema or rash.  Neurological:     Mental Status: He is alert and oriented to person, place, and time. Mental status is at baseline.  Psychiatric:        Mood and Affect: Mood normal.        Behavior: Behavior normal.     ED Results / Procedures / Treatments   Labs (all labs ordered are listed, but only abnormal results are displayed) Labs  Reviewed  BASIC METABOLIC PANEL - Abnormal; Notable for the following components:      Result Value   Creatinine, Ser 1.60 (*)    GFR, Estimated 47 (*)    All other components within normal limits  CBC - Abnormal; Notable for the following components:   WBC 3.4 (*)  RBC 3.80 (*)    Hemoglobin 9.4 (*)    HCT 30.5 (*)    MCH 24.7 (*)    RDW 19.0 (*)    Platelets 95 (*)    All other components within normal limits  BRAIN NATRIURETIC PEPTIDE - Abnormal; Notable for the following components:   B Natriuretic Peptide 219.8 (*)    All other components within normal limits    EKG EKG Interpretation Date/Time:  Wednesday May 07 2023 15:53:48 EST Ventricular Rate:  69 PR Interval:    QRS Duration:  86 QT Interval:  430 QTC Calculation: 460 R Axis:   75  Text Interpretation: new  Atrial flutter with variable A-V block T wave abnormality, consider inferolateral ischemia new  Prolonged QT When compared with ECG of 08-Nov-2022 21:01, PREVIOUS ECG IS PRESENT  Confirmed by Doretha Folks ([id number]) on 05/07/2023 9:04:04 PM  Radiology DG Chest 2 View Result Date: 05/07/2023 CLINICAL DATA:  Dyspnea on exertion. EXAM: CHEST - 2 VIEW COMPARISON:  Chest radiograph dated 03/25/2023. FINDINGS: There is mild cardiomegaly with mild central vascular congestion. No focal consolidation, pleural effusion, or pneumothorax. Bilateral hilar prominence suggestive of pulmonary hypertension. Mild atherosclerotic calcification of the aortic arch. No acute osseous pathology. IMPRESSION: Mild cardiomegaly with mild central vascular congestion. No focal consolidation. Electronically Signed   By: Vanetta Chou M.D.   On: 05/07/2023 17:24    Procedures Procedures    Medications Ordered in ED Medications  albuterol  (PROVENTIL ) (2.5 MG/3ML) 0.083% nebulizer solution 5 mg (has no administration in time range)  ipratropium (ATROVENT ) nebulizer solution 0.5 mg (has no administration in time range)   furosemide  (LASIX ) injection 60 mg (60 mg Intravenous Given 05/07/23 2121)  carvedilol  (COREG ) tablet 3.125 mg (3.125 mg Oral Given 05/07/23 2238)    ED Course/ Medical Decision Making/ A&P                                 Medical Decision Making Amount and/or Complexity of Data Reviewed Independent Historian: EMS External Data Reviewed: notes. Labs: ordered. Decision-making details documented in ED Course. Radiology: ordered and independent interpretation performed. Decision-making details documented in ED Course. ECG/medicine tests: ordered and independent interpretation performed. Decision-making details documented in ED Course.  Risk Prescription drug management. Decision regarding hospitalization.   Pt with multiple medical problems and comorbidities and presenting today with a complaint that caries a high risk for morbidity and mortality.  Here today with above complaints.  Concern for CHF exacerbation versus COPD exacerbation in the setting of having recent COVID, acute kidney injury, results of medication noncompliance.  Lower suspicion for pneumonia.  Patient has no abdominal pain concerning for SBP.  Also concern for possible anemia or dysrhythmia. I independently interpreted patient's labs and EKG.  Initial EKG today looked concerning for possible atrial flutter without prior history and patient is not anticoagulated.  Will repeat EKG to reevaluate but could be source of some of his shortness of breath.  BMP with normal electrolytes, creatinine of 1.6 which is improved from prior and CBC with leukopenia with a white count of 3.4, stable anemia with a hemoglobin of 9.4 and thrombocytopenia with a platelet count of 95 which is slightly lower than his last 1 at 120 BNP is at 219 which is improved from prior readings.  I have independently visualized and interpreted pt's images today.  Chest x-ray with mild vascular congestion.  Radiology reports mild cardiomegaly.  On patient's  last echo  last year had an EF of greater than 60%.  Patient is in no distress on exam but will give a dose of Lasix  as he has not had it for a week.  Will repeat EKG to confirm that he is in atrial flutter.  Low suspicion for PE, dissection, ACS.  11:19 PM Repeat EKG shows atrial fibrillation today.  With review of prior cardiology notes patient has never been noted to be in atrial fibrillation in the past.  Patient has a CHA2DS2-VASc of 4 and may need to be anticoagulated however he has a history of cirrhosis as well as varices with chronic anemia that is unchanged today.  Will give patient a dose of his carvedilol  which she has not had today to see if he is rate controlled and possibly can go home.  If he is not rate controlled he will need admission.  11:19 PM On reevaluation patient's heart rate is intermittently rate controlled but even with minimal movement he becomes short of breath and has started wheezing more.  Concern patient has a combination of CHF and COPD exacerbation given his recent diagnosis of COVID last week.  No signs of pneumonia at this time.  Discussed this with the patient he was given Solu-Medrol  by EMS and will give another breathing treatment now.  However will admit for ongoing rate control and risk benefit of starting anticoagulation.  He does report no further alcohol use but he does continue to use cocaine and last smoked yesterday.  Consulted hospitalist for admission.  Patient has still produce very little urine at this time.    CRITICAL CARE Performed by: Maddy Graham Total critical care time: 30 minutes Critical care time was exclusive of separately billable procedures and treating other patients. Critical care was necessary to treat or prevent imminent or life-threatening deterioration. Critical care was time spent personally by me on the following activities: development of treatment plan with patient and/or surrogate as well as nursing, discussions with consultants,  evaluation of patient's response to treatment, examination of patient, obtaining history from patient or surrogate, ordering and performing treatments and interventions, ordering and review of laboratory studies, ordering and review of radiographic studies, pulse oximetry and re-evaluation of patient's condition.     Final Clinical Impression(s) / ED Diagnoses Final diagnoses:  Atrial fibrillation, unspecified type (HCC)  COPD with acute exacerbation (HCC)  Acute on chronic congestive heart failure, unspecified heart failure type [facility])    Rx / DC Orders ED Discharge Orders     None         Doretha Folks, MD 05/07/23 [id number]    Doretha Folks, MD 05/07/23 2320

## 2023-05-07 NOTE — ED Notes (Signed)
 Sandwich and coke provided

## 2023-05-08 ENCOUNTER — Other Ambulatory Visit: Payer: Self-pay

## 2023-05-08 ENCOUNTER — Encounter (HOSPITAL_COMMUNITY): Payer: Self-pay | Admitting: Internal Medicine

## 2023-05-08 DIAGNOSIS — I4891 Unspecified atrial fibrillation: Secondary | ICD-10-CM

## 2023-05-08 DIAGNOSIS — N1832 Chronic kidney disease, stage 3b: Secondary | ICD-10-CM | POA: Diagnosis not present

## 2023-05-08 DIAGNOSIS — K7031 Alcoholic cirrhosis of liver with ascites: Secondary | ICD-10-CM

## 2023-05-08 DIAGNOSIS — J441 Chronic obstructive pulmonary disease with (acute) exacerbation: Secondary | ICD-10-CM

## 2023-05-08 DIAGNOSIS — I5033 Acute on chronic diastolic (congestive) heart failure: Secondary | ICD-10-CM | POA: Diagnosis not present

## 2023-05-08 LAB — COMPREHENSIVE METABOLIC PANEL
ALT: 9 U/L (ref 0–44)
AST: 16 U/L (ref 15–41)
Albumin: 3.5 g/dL (ref 3.5–5.0)
Alkaline Phosphatase: 50 U/L (ref 38–126)
Anion gap: 12 (ref 5–15)
BUN: 27 mg/dL — ABNORMAL HIGH (ref 8–23)
CO2: 23 mmol/L (ref 22–32)
Calcium: 8.9 mg/dL (ref 8.9–10.3)
Chloride: 100 mmol/L (ref 98–111)
Creatinine, Ser: 2.27 mg/dL — ABNORMAL HIGH (ref 0.61–1.24)
GFR, Estimated: 31 mL/min — ABNORMAL LOW (ref >60)
Glucose, Bld: 125 mg/dL — ABNORMAL HIGH (ref 70–99)
Potassium: 3.9 mmol/L (ref 3.5–5.1)
Sodium: 135 mmol/L (ref 135–145)
Total Bilirubin: 1.6 mg/dL — ABNORMAL HIGH (ref 0.0–1.2)
Total Protein: 7.8 g/dL (ref 6.5–8.1)

## 2023-05-08 LAB — POC OCCULT BLOOD, ED: Fecal Occult Bld: NEGATIVE

## 2023-05-08 LAB — CBC
HCT: 28.3 % — ABNORMAL LOW (ref 39.0–52.0)
Hemoglobin: 9.1 g/dL — ABNORMAL LOW (ref 13.0–17.0)
MCH: 25.4 pg — ABNORMAL LOW (ref 26.0–34.0)
MCHC: 32.2 g/dL (ref 30.0–36.0)
MCV: 79.1 fL — ABNORMAL LOW (ref 80.0–100.0)
Platelets: 98 10*3/uL — ABNORMAL LOW (ref 150–400)
RBC: 3.58 MIL/uL — ABNORMAL LOW (ref 4.22–5.81)
RDW: 18.8 % — ABNORMAL HIGH (ref 11.5–15.5)
WBC: 3.7 10*3/uL — ABNORMAL LOW (ref 4.0–10.5)
nRBC: 0 % (ref 0.0–0.2)

## 2023-05-08 LAB — CBG MONITORING, ED
Glucose-Capillary: 137 mg/dL — ABNORMAL HIGH (ref 70–99)
Glucose-Capillary: 115 mg/dL — ABNORMAL HIGH (ref 70–99)
Glucose-Capillary: 128 mg/dL — ABNORMAL HIGH (ref 70–99)

## 2023-05-08 LAB — PROTIME-INR
INR: 1.6 — ABNORMAL HIGH (ref 0.8–1.2)
Prothrombin Time: 19.1 s — ABNORMAL HIGH (ref 11.4–15.2)

## 2023-05-08 LAB — GLUCOSE, CAPILLARY
Glucose-Capillary: 129 mg/dL — ABNORMAL HIGH (ref 70–99)
Glucose-Capillary: 109 mg/dL — ABNORMAL HIGH (ref 70–99)

## 2023-05-08 LAB — LACTIC ACID, PLASMA: Lactic Acid, Venous: 2 mmol/L (ref 0.5–1.9)

## 2023-05-08 LAB — HEMOGLOBIN A1C
Hgb A1c MFr Bld: 5.3 % (ref 4.8–5.6)
Mean Plasma Glucose: 105.41 mg/dL

## 2023-05-08 LAB — MAGNESIUM: Magnesium: 2.3 mg/dL (ref 1.7–2.4)

## 2023-05-08 LAB — SARS CORONAVIRUS 2 BY RT PCR: SARS Coronavirus 2 by RT PCR: NEGATIVE

## 2023-05-08 MED ORDER — SPIRONOLACTONE 25 MG PO TABS
50.0000 mg | ORAL_TABLET | Freq: Every day | ORAL | Status: DC
  Administered 2023-05-08 – 2023-05-09 (×2): 50 mg via ORAL
  Filled 2023-05-08 (×4): qty 2

## 2023-05-08 MED ORDER — MAGNESIUM HYDROXIDE 400 MG/5ML PO SUSP: 30.0000 mL | Freq: Every day | ORAL | Status: DC | PRN

## 2023-05-08 MED ORDER — ONDANSETRON HCL 4 MG PO TABS: 4.0000 mg | ORAL_TABLET | Freq: Four times a day (QID) | ORAL | Status: DC | PRN

## 2023-05-08 MED ORDER — FOLIC ACID 1 MG PO TABS
1.0000 mg | ORAL_TABLET | Freq: Every day | ORAL | Status: DC
  Administered 2023-05-08 – 2023-05-14 (×7): 1 mg via ORAL
  Filled 2023-05-08 (×7): qty 1

## 2023-05-08 MED ORDER — ONDANSETRON HCL 4 MG/2ML IJ SOLN: 4.0000 mg | Freq: Four times a day (QID) | INTRAMUSCULAR | Status: DC | PRN

## 2023-05-08 MED ORDER — FERROUS SULFATE 325 (65 FE) MG PO TABS
325.0000 mg | ORAL_TABLET | ORAL | Status: DC
  Administered 2023-05-09 – 2023-05-14 (×3): 325 mg via ORAL
  Filled 2023-05-08 (×4): qty 1

## 2023-05-08 MED ORDER — INSULIN ASPART 100 UNIT/ML IJ SOLN
0.0000 [IU] | Freq: Three times a day (TID) | INTRAMUSCULAR | Status: DC
  Filled 2023-05-08: qty 0.06

## 2023-05-08 MED ORDER — PANTOPRAZOLE SODIUM 40 MG PO TBEC
40.0000 mg | DELAYED_RELEASE_TABLET | Freq: Every day | ORAL | Status: DC
  Administered 2023-05-08 – 2023-05-14 (×7): 40 mg via ORAL
  Filled 2023-05-08 (×7): qty 1

## 2023-05-08 MED ORDER — CARVEDILOL 3.125 MG PO TABS
3.1250 mg | ORAL_TABLET | Freq: Two times a day (BID) | ORAL | Status: DC
  Administered 2023-05-08 – 2023-05-11 (×8): 3.125 mg via ORAL
  Filled 2023-05-08 (×9): qty 1

## 2023-05-08 MED ORDER — ALLOPURINOL 100 MG PO TABS
100.0000 mg | ORAL_TABLET | Freq: Every day | ORAL | Status: DC
  Administered 2023-05-08 – 2023-05-14 (×7): 100 mg via ORAL
  Filled 2023-05-08 (×8): qty 1

## 2023-05-08 MED ORDER — FUROSEMIDE 10 MG/ML IJ SOLN
60.0000 mg | Freq: Two times a day (BID) | INTRAMUSCULAR | Status: DC
  Administered 2023-05-08 – 2023-05-09 (×3): 60 mg via INTRAVENOUS
  Filled 2023-05-08: qty 6
  Filled 2023-05-08: qty 8
  Filled 2023-05-08 (×3): qty 6

## 2023-05-08 MED ORDER — LIDOCAINE 5 % EX PTCH
1.0000 | MEDICATED_PATCH | CUTANEOUS | Status: DC
  Administered 2023-05-08 – 2023-05-14 (×7): 1 via TRANSDERMAL
  Filled 2023-05-08 (×7): qty 1

## 2023-05-08 MED ORDER — MELATONIN 5 MG PO TABS
5.0000 mg | ORAL_TABLET | Freq: Every day | ORAL | Status: DC
  Administered 2023-05-08 – 2023-05-13 (×7): 5 mg via ORAL
  Filled 2023-05-08 (×7): qty 1

## 2023-05-08 MED ORDER — MAGNESIUM SULFATE 2 GM/50ML IV SOLN
2.0000 g | Freq: Once | INTRAVENOUS | Status: AC
  Administered 2023-05-08: 2 g via INTRAVENOUS
  Filled 2023-05-08: qty 50

## 2023-05-08 MED ORDER — THIAMINE MONONITRATE 100 MG PO TABS
100.0000 mg | ORAL_TABLET | Freq: Every day | ORAL | Status: DC
  Administered 2023-05-08 – 2023-05-14 (×7): 100 mg via ORAL
  Filled 2023-05-08 (×7): qty 1

## 2023-05-08 NOTE — H&P (Addendum)
 History and Physical    Patient: John [name] FMW:[id number] DOB: 05-30-1955 DOA: 05/07/2023 DOS: the patient was seen and examined on 05/08/2023 PCP: Patient, No Pcp Per  Patient coming from: Home  Chief Complaint:  Chief Complaint  Patient presents with   fluid retention   HPI: John [name] is a 68 y.o. male with medical history significant  with history of CKD and alcoholic cirrhosis with variceal bleeding and paracenteses many years ago.  He quit drinking alcohol greater than 10 years ago.  He continues to have a cocaine habit but he is compliant with his medication when he has them.  He reports that he has been out of his diuretic for several months.  He gets all of his medication by mail and for some reason that medication was no longer sent to him.  He did not investigate why.  The swelling has been worsening over the last week.  It got to the point where it was very painful in his legs and his abdomen has become huge. Evaluation in the emergency department include included blood work and an EKG.  The EKG revealed new onset A-fib and flutter.  The hospitalist will be admitting the patient for his volume overload and new A-fib. The patient does plan to quit cocaine.  He has a little great-niece and he says he wants to live to be around to see her grow up.   Review of Systems: As mentioned in the history of present illness. All other systems reviewed and are negative. Past Medical History:  Diagnosis Date   Allergy    Arthritis    oa back   Asthma    Cataract    both eyes   CHF (congestive heart failure) (HCC) 2001   sees primary for   Chronic back pain    Chronic kidney disease    ckd 3   Cirrhosis (HCC)    alcoholic with h/o varices, ascites   Coronary artery disease    nonobstrucrtibe   ETOH abuse    GERD (gastroesophageal reflux disease)    Gout    Hepatitis    hepatitic c 2018 24 week tx with ecuplipsa, no hepatitic c detected after tx   History of blood  transfusion 8-9- yrs ago   Hypertension    Optic neuropathy, left    PAD (peripheral artery disease) (HCC)    slight  primary manages   Polysubstance abuse (HCC)    Renal cell carcinoma (HCC) 2016, 2018 and 2022   left ablation   Seizures [facility])    age 69 none since   Sickle cell trait (HCC)    Stroke (HCC) 1998   light no problems with   Uses walker 12/04/2020   Wears dentures    Wears glasses    Past Surgical History:  Procedure Laterality Date   ARTHRODESIS METATARSALPHALANGEAL JOINT (MTPJ) Right 12/07/2020   Procedure: ARTHRODESIS METATARSALPHALANGEAL JOINT (MTPJ);  Surgeon: Janit Thresa HERO, DPM;  Location: [facility];  Service: Podiatry;  Laterality: Right;   COLONOSCOPY  2021   ESOPHAGOGASTRODUODENOSCOPY N/A 08/14/2014   Procedure: ESOPHAGOGASTRODUODENOSCOPY (EGD);  Surgeon: Gwendlyn ONEIDA Buddy, MD;  Location: [facility];  Service: Endoscopy;  Laterality: N/A;   ESOPHAGOGASTRODUODENOSCOPY (EGD) WITH PROPOFOL  N/A 06/03/2017   Procedure: ESOPHAGOGASTRODUODENOSCOPY (EGD) WITH PROPOFOL ;  Surgeon: Buddy Gwendlyn ONEIDA, MD;  Location: [facility];  Service: Endoscopy;  Laterality: N/A;   HALLUX FUSION Left 07/28/2020   Procedure: HALLUX FUSION MPJ;  Surgeon: Janit Thresa HERO, DPM;  Location: [facility];  Service: Podiatry;  Laterality: Left;   HAMMER TOE SURGERY Left 07/28/2020   Procedure: HAMMER TOE CORRECTION  2,3,AND4 LEFT FOOT;  Surgeon: Janit Thresa HERO, DPM;  Location[facility];  Service: Podiatry;  Laterality: Left;   HAMMER TOE SURGERY Right 12/07/2020   Procedure: HAMMER TOE CORRECTION  2-4;  Surgeon: Janit Thresa HERO, DPM;  Location: [facility];  Service: Podiatry;  Laterality: Right;   IR RADIOLOGIST EVAL & MGMT  09/25/2016   IR RADIOLOGIST EVAL & MGMT  12/10/2016   IR RADIOLOGIST EVAL & MGMT  03/25/2018   IR RADIOLOGIST EVAL & MGMT  03/24/2019   IR RADIOLOGIST EVAL & MGMT  05/17/2020   IR RADIOLOGIST EVAL & MGMT   08/03/2020   IR RADIOLOGIST EVAL & MGMT  11/22/2020   IR RADIOLOGIST EVAL & MGMT  05/31/2021   METATARSAL HEAD EXCISION Left 07/28/2020   Procedure: METATARSAL HEAD EXCISION TOES 2,3,AND 4  LEFT FOOT;  Surgeon: Janit Thresa HERO, DPM;  Location: [facility];  Service: Podiatry;  Laterality: Left;   METATARSAL OSTEOTOMY Right 12/07/2020   Procedure: METATARSAL OSTEOTOMY TOES 2-4 RIGHT FOOT;  Surgeon: Janit Thresa HERO, DPM;  Location: [facility];  Service: Podiatry;  Laterality: Right;   none     RADIOFREQUENCY ABLATION Left 11/08/2016   Procedure: LEFT RENAL CRYOABLATION;  Surgeon: Vanice Sharper, MD;  Location: [facility];  Service: Anesthesiology;  Laterality: Left;   RADIOLOGY WITH ANESTHESIA N/A 08/15/2014   Procedure: RADIOLOGY WITH ANESTHESIA;  Surgeon: Sharper Vanice, MD;  [facility];  Service: Radiology;  Laterality: N/A;   RADIOLOGY WITH ANESTHESIA Left 07/05/2020   Procedure: CT CRYOABLATION;  Surgeon: Vanice Sharper, MD;  Location: [facility];  Service: Anesthesiology;  Laterality: Left;   UPPER GASTROINTESTINAL ENDOSCOPY     Social History:  reports that he quit smoking about 12 years ago. His smoking use included cigarettes. He started smoking about 32 years ago. He has a 6 pack-year smoking history. He has never used smokeless tobacco. He reports current drug use. Drugs: Cocaine and Marijuana. He reports that he does not drink alcohol.  Allergies  Allergen Reactions   Penicillins Other (See Comments)    Convulsions, Patient could not walk, childhood allergy   Has patient had a PCN reaction causing immediate rash, facial/tongue/throat swelling, SOB or lightheadedness with hypotension: No Has patient had a PCN reaction causing severe rash involving mucus membranes or skin necrosis: No Has patient had a PCN reaction that required hospitalization No Has patient had a PCN reaction occurring within the last 10 years: No If all of the above answers are NO, then  may proceed with Cephalosporin use.    Family History  Problem Relation Age of Onset   Hypertension Mother        Living   Kidney disease Mother    Diabetes Mother    Heart disease Mother    Hypertension Father        Deceased, 87   Ulcers Father    Stomach cancer Father    Hypertension Brother    Diabetes Brother    Kidney disease Brother    Hypertension Sister    Colon cancer Neg Hx    Esophageal cancer Neg Hx    Rectal cancer Neg Hx     Prior to Admission medications   Medication Sig Start Date End Date Taking? Authorizing Provider  acetaminophen  (TYLENOL ) 325 MG tablet Take 650 mg by  mouth every 8 (eight) hours as needed for moderate pain (NOT TO EXCEED 2,000 MG/DAY).   Yes [provider]  allopurinol  (ZYLOPRIM ) 100 MG tablet Take 1 tablet (100 mg total) by mouth daily. 05/12/18  Yes Sebastian Toribio GAILS, MD  atorvastatin  (LIPITOR) 40 MG tablet Take 40 mg by mouth daily. 04/29/19  Yes [provider]  carvedilol  (COREG ) 3.125 MG tablet Take 1 tablet (3.125 mg total) by mouth 2 (two) times daily with a meal. 08/01/22  Yes Lue Elsie BROCKS, MD  ferrous sulfate  325 (65 FE) MG EC tablet Take 325 mg by mouth every Monday, Wednesday, and Friday.   Yes [provider]  FLOVENT  HFA 220 MCG/ACT inhaler Inhale 2 puffs into the lungs in the morning and at bedtime. 01/27/19  Yes [provider]  furosemide  (LASIX ) 20 MG tablet Take 3 tablets (60 mg total) by mouth 2 (two) times daily. 11/05/22  Yes Hobart Powell BRAVO, MD  ketorolac  (ACULAR ) 0.5 % ophthalmic solution Place 1 drop into the right eye 4 (four) times daily. 03/20/23  Yes [provider]  lidocaine  (LIDODERM ) 5 % Place 1 patch onto the skin daily. Remove & Discard patch within 12 hours or as directed by MD 11/08/22  Yes Randol Simmonds, MD  melatonin 5 MG TABS Take 5 mg by mouth at bedtime.   Yes [provider]  Menthol -Methyl Salicylate  (SALONPAS  PAIN RELIEF  PATCH) PTCH Apply 1  patch topically daily as needed (for pain- affected area).   Yes [provider]  ofloxacin (OCUFLOX) 0.3 % ophthalmic solution Place 1 drop into the right eye 4 (four) times daily. 03/20/23  Yes [provider]  omeprazole  (PRILOSEC) 40 MG capsule TAKE ONE CAPSULE BY MOUTH DAILY Patient taking differently: Take 40 mg by mouth daily before breakfast. 07/02/18  Yes Aneita Gwendlyn DASEN, MD  potassium chloride  (KLOR-CON ) 10 MEQ tablet Take 10 mEq by mouth daily.   Yes [provider]  prednisoLONE acetate (PRED FORTE) 1 % ophthalmic suspension Place 1 drop into the right eye 4 (four) times daily. 03/20/23  Yes [provider]  PROAIR  HFA 108 (90 Base) MCG/ACT inhaler Inhale 2 puffs into the lungs every 6 (six) hours as needed for wheezing or shortness of breath. 10/02/17  Yes [provider]  spironolactone  (ALDACTONE ) 50 MG tablet Take 1 tablet (50 mg total) by mouth daily. 08/01/22  Yes Lue Elsie BROCKS, MD  brimonidine  (ALPHAGAN ) 0.15 % ophthalmic solution Place 1 drop into both eyes at bedtime.    [provider]  empagliflozin  (JARDIANCE ) 10 MG TABS tablet Take 1 tablet (10 mg total) by mouth daily before breakfast. 11/05/22   Hobart Powell BRAVO, MD  empagliflozin  (JARDIANCE ) 10 MG TABS tablet Take 1 tablet (10 mg total) by mouth daily before breakfast. 11/05/22   Hobart Powell BRAVO, MD  terbinafine (LAMISIL) 1 % cream Apply 1 Application topically See admin instructions. Apply to feet as directed every other day    [provider]    Physical Exam: Vitals:   05/07/23 2130 05/07/23 2142 05/07/23 2238 05/08/23 0100  BP: (!) 148/113  (!) 134/102 119/89  Pulse: (!) 54 (!) 123 (!) 107 92  Resp: 20 15  19   Temp:    98.1 F (36.7 C)  TempSrc:    Oral  SpO2: 96% 97%  96%  Weight:      Height:       Physical Exam:  General: No acute distress HEENT: Normocephalic, atraumatic, PERRL Cardiovascular: Normal rate  and irregular rhythm.  Distal pulses intact. Point tenderness in left lower chest Pulmonary: Normal pulmonary effort, occasional Rales in the bases with mild expiratory wheeze in the bases Gastrointestinal: Distended abdomen, non-tender, normoactive bowel sounds Musculoskeletal:Normal ROM, 4+ lower ext edema Skin: Skin is warm and dry. Neuro: No focal deficits noted, AAOx3.  Generalized weakness PSYCH: Attentive and cooperative  Data Reviewed:  Results for orders placed or performed during the hospital encounter of 05/07/23 (from the past 24 hours)  Basic metabolic panel     Status: Abnormal   Collection Time: 05/07/23  4:22 PM  Result Value Ref Range   Sodium 136 135 - 145 mmol/L   Potassium 4.0 3.5 - 5.1 mmol/L   Chloride 104 98 - 111 mmol/L   CO2 22 22 - 32 mmol/L   Glucose, Bld 82 70 - 99 mg/dL   BUN 23 8 - 23 mg/dL   Creatinine, Ser 8.39 (H) 0.61 - 1.24 mg/dL   Calcium  9.2 8.9 - 10.3 mg/dL   GFR, Estimated 47 (L) >60 mL/min   Anion gap 10 5 - 15  CBC     Status: Abnormal   Collection Time: 05/07/23  4:22 PM  Result Value Ref Range   WBC 3.4 (L) 4.0 - 10.5 K/uL   RBC 3.80 (L) 4.22 - 5.81 MIL/uL   Hemoglobin 9.4 (L) 13.0 - 17.0 g/dL   HCT 69.4 (L) 60.9 - 47.9 %   MCV 80.3 80.0 - 100.0 fL   MCH 24.7 (L) 26.0 - 34.0 pg   MCHC 30.8 30.0 - 36.0 g/dL   RDW 80.9 (H) 88.4 - 84.4 %   Platelets 95 (L) 150 - 400 K/uL   nRBC 0.0 0.0 - 0.2 %  Brain natriuretic peptide     Status: Abnormal   Collection Time: 05/07/23  4:22 PM  Result Value Ref Range   B Natriuretic Peptide 219.8 (H) 0.0 - 100.0 pg/mL  SARS Coronavirus 2 by RT PCR (hospital order, performed in [facility] hospital lab) *cepheid single result test* Anterior Nasal Swab     Status: None   Collection Time: 05/08/23 12:01 AM   Specimen: Anterior Nasal Swab  Result Value Ref Range   SARS Coronavirus 2 by RT PCR NEGATIVE NEGATIVE  POC occult blood, ED     Status: None   Collection Time: 05/08/23 12:14 AM  Result Value Ref Range   Fecal  Occult Bld NEGATIVE NEGATIVE     Assessment and Plan: New onset afib/ flutter -his heart rate is not elevated.SABRA  His CHA2DS2-VASc 2 score is 2 for age and diabetes.  He does agree to take oral anticoagulation but in the short-term he may have a paracentesis in am. - Will hold dose of lovenox  tonight but this can be started if he is not able to have the paracentesis in the a.m. His history of varices and GI bleed were in the distant past.  His hemoglobin occult test in the ER was negative. History of cirrhosis.  Now with volume overload - the patient says his last paracentesis was greater than 5 years ago.  He has been noncompliant with his Lasix . - Diurese aggressively - Paracentesis 3.  Hypomagnesemia - Replete 4. H/o Gout -  continue allopurinol  5. DMT2 - corrective dose insulin  for now     Advance Care Planning:   Code Status: Full Code the patient names his niece Jamica as a runner, broadcasting/film/video and he wants to be full code.  Consults: none  Family  Communication: nnoe  Severity of Illness: The appropriate patient status for this patient is INPATIENT. Inpatient status is judged to be reasonable and necessary in order to provide the required intensity of service to ensure the patient's safety. The patient's presenting symptoms, physical exam findings, and initial radiographic and laboratory data in the context of their chronic comorbidities is felt to place them at high risk for further clinical deterioration. Furthermore, it is not anticipated that the patient will be medically stable for discharge from the hospital within 2 midnights of admission.   * I certify that at the point of admission it is my clinical judgment that the patient will require inpatient hospital care spanning beyond 2 midnights from the point of admission due to high intensity of service, high risk for further deterioration and high frequency of surveillance required.*  Author: ARTHEA CHILD,  MD 05/08/2023 1:24 AM  For on call review [email or web address].

## 2023-05-08 NOTE — Plan of Care (Signed)

## 2023-05-08 NOTE — ED Notes (Signed)
 ED TO INPATIENT HANDOFF REPORT  ED Nurse Name and Phone #: Olen, RN [phone]  S Name/Age/Gender John [name] 68 y.o. male Room/Bed: WA17/WA17  Code Status   Code Status: Full Code  Home/SNF/Other Home Patient oriented to: self, place, time, and situation Is this baseline? Yes   Triage Complete: Triage complete  Chief Complaint New onset atrial fibrillation [facility]) [I48.91]  Triage Note Pt arrives via EMS from home with SOB and fluid retention for a few days. Pt out of lasix  for a week. Abd distention and wheezing. EMS gave duoneb. Denies CP. SOB with exertion.  20G LAC and 125 solu-medrol  given.    Allergies Allergies  Allergen Reactions   Penicillins Other (See Comments)    Convulsions, Patient could not walk, childhood allergy   Has patient had a PCN reaction causing immediate rash, facial/tongue/throat swelling, SOB or lightheadedness with hypotension: No Has patient had a PCN reaction causing severe rash involving mucus membranes or skin necrosis: No Has patient had a PCN reaction that required hospitalization No Has patient had a PCN reaction occurring within the last 10 years: No If all of the above answers are NO, then may proceed with Cephalosporin use.    Level of Care/Admitting Diagnosis ED Disposition     ED Disposition  Admit   Condition  --   Comment  Hospital Area: [facility] [[id number]]  Level of Care: Telemetry [5]  Admit to tele based on following criteria: Acute CHF  May admit patient to [name] or [name] if equivalent level of care is available:: Yes  Covid Evaluation: Recent COVID positive no isolation required infection day 21-90  Diagnosis: New onset atrial fibrillation [facility]) [[id number]]  Admitting Physician: ARTHEA CHILD [[id number]]  Attending Physician: ARTHEA CHILD [[id number]]  Certification:: I certify this patient will need inpatient services for at least 2 midnights  Expected Medical Readiness:  05/10/2023          B Medical/Surgery History Past Medical History:  Diagnosis Date   Allergy    Arthritis    oa back   Asthma    Cataract    both eyes   CHF (congestive heart failure) (HCC) 2001   sees primary for   Chronic back pain    Chronic kidney disease    ckd 3   Cirrhosis (HCC)    alcoholic with h/o varices, ascites   Coronary artery disease    nonobstrucrtibe   ETOH abuse    GERD (gastroesophageal reflux disease)    Gout    Hepatitis    hepatitic c 2018 24 week tx with ecuplipsa, no hepatitic c detected after tx   History of blood transfusion 8-9- yrs ago   Hypertension    Optic neuropathy, left    PAD (peripheral artery disease) (HCC)    slight  primary manages   Polysubstance abuse (HCC)    Renal cell carcinoma (HCC) 2016, 2018 and 2022   left ablation   Seizures [facility])    age 27 none since   Sickle cell trait (HCC)    Stroke (HCC) 1998   light no problems with   Uses walker 12/04/2020   Wears dentures    Wears glasses    Past Surgical History:  Procedure Laterality Date   ARTHRODESIS METATARSALPHALANGEAL JOINT (MTPJ) Right 12/07/2020   Procedure: ARTHRODESIS METATARSALPHALANGEAL JOINT (MTPJ);  Surgeon: Janit Thresa HERO, DPM;  Location: [facility];  Service: Podiatry;  Laterality: Right;   COLONOSCOPY  2021   ESOPHAGOGASTRODUODENOSCOPY  N/A 08/14/2014   Procedure: ESOPHAGOGASTRODUODENOSCOPY (EGD);  Surgeon: Gwendlyn ONEIDA Buddy, MD;  Location: [facility];  Service: Endoscopy;  Laterality: N/A;   ESOPHAGOGASTRODUODENOSCOPY (EGD) WITH PROPOFOL  N/A 06/03/2017   Procedure: ESOPHAGOGASTRODUODENOSCOPY (EGD) WITH PROPOFOL ;  Surgeon: Buddy Gwendlyn ONEIDA, MD;  Location: [facility];  Service: Endoscopy;  Laterality: N/A;   HALLUX FUSION Left 07/28/2020   Procedure: HALLUX FUSION MPJ;  Surgeon: Janit Thresa HERO, DPM;  Location[facility];  Service: Podiatry;  Laterality: Left;   HAMMER TOE SURGERY Left 07/28/2020   Procedure: HAMMER  TOE CORRECTION  2,3,AND4 LEFT FOOT;  Surgeon: Janit Thresa HERO, DPM;  Location: [facility];  Service: Podiatry;  Laterality: Left;   HAMMER TOE SURGERY Right 12/07/2020   Procedure: HAMMER TOE CORRECTION  2-4;  Surgeon: Janit Thresa HERO, DPM;  Location: [facility];  Service: Podiatry;  Laterality: Right;   IR RADIOLOGIST EVAL & MGMT  09/25/2016   IR RADIOLOGIST EVAL & MGMT  12/10/2016   IR RADIOLOGIST EVAL & MGMT  03/25/2018   IR RADIOLOGIST EVAL & MGMT  03/24/2019   IR RADIOLOGIST EVAL & MGMT  05/17/2020   IR RADIOLOGIST EVAL & MGMT  08/03/2020   IR RADIOLOGIST EVAL & MGMT  11/22/2020   IR RADIOLOGIST EVAL & MGMT  05/31/2021   METATARSAL HEAD EXCISION Left 07/28/2020   Procedure: METATARSAL HEAD EXCISION TOES 2,3,AND 4  LEFT FOOT;  Surgeon: Janit Thresa HERO, DPM;  Location: [facility];  Service: Podiatry;  Laterality: Left;   METATARSAL OSTEOTOMY Right 12/07/2020   Procedure: METATARSAL OSTEOTOMY TOES 2-4 RIGHT FOOT;  Surgeon: Janit Thresa HERO, DPM;  Location: [facility];  Service: Podiatry;  Laterality: Right;   none     RADIOFREQUENCY ABLATION Left 11/08/2016   Procedure: LEFT RENAL CRYOABLATION;  Surgeon: Vanice Sharper, MD;  Location[facility];  Service: Anesthesiology;  Laterality: Left;   RADIOLOGY WITH ANESTHESIA N/A 08/15/2014   Procedure: RADIOLOGY WITH ANESTHESIA;  Surgeon: Sharper Vanice, MD;  Location[facility];  Service: Radiology;  Laterality: N/A;   RADIOLOGY WITH ANESTHESIA Left 07/05/2020   Procedure: CT CRYOABLATION;  Surgeon: Vanice Sharper, MD;  Location: [facility];  Service: Anesthesiology;  Laterality: Left;   UPPER GASTROINTESTINAL ENDOSCOPY       A IV Location/Drains/Wounds Patient Lines/Drains/Airways Status     Active Line/Drains/Airways     Name Placement date Placement time Site Days   Peripheral IV 05/07/23 20 G Left Antecubital 05/07/23  1546  Antecubital  1   Wound / Incision (Open or Dehisced) 07/31/22  Non-pressure wound;Irritant Dermatitis (Moisture Associated Skin Damage) Toe (Comment  which one) Anterior;Right MASD between first two toes 07/31/22  1630  Toe (Comment  which one)  281   Wound / Incision (Open or Dehisced) 07/31/22 Non-pressure wound Toe (Comment  which one) Anterior;Left;Upper open wound 07/31/22  1630  Toe (Comment  which one)  281            Intake/Output Last 24 hours  Intake/Output Summary (Last 24 hours) at 05/08/2023 1308 Last data filed at 05/08/2023 0311 Gross per 24 hour  Intake 32 ml  Output 1050 ml  Net -1018 ml    Labs/Imaging Results for orders placed or performed during the hospital encounter of 05/07/23 (from the past 48 hours)  Basic metabolic panel     Status: Abnormal   Collection Time: 05/07/23  4:22 PM  Result Value Ref Range   Sodium 136 135 - 145 mmol/L  Potassium 4.0 3.5 - 5.1 mmol/L   Chloride 104 98 - 111 mmol/L   CO2 22 22 - 32 mmol/L   Glucose, Bld 82 70 - 99 mg/dL    Comment: Glucose reference range applies only to samples taken after fasting for at least 8 hours.   BUN 23 8 - 23 mg/dL   Creatinine, Ser 8.39 (H) 0.61 - 1.24 mg/dL   Calcium  9.2 8.9 - 10.3 mg/dL   GFR, Estimated 47 (L) >60 mL/min    Comment: (NOTE) Calculated using the CKD-EPI Creatinine Equation (2021)    Anion gap 10 5 - 15    Comment: Performed at [facility], [address] [place], [place] [address]  CBC     Status: Abnormal   Collection Time: 05/07/23  4:22 PM  Result Value Ref Range   WBC 3.4 (L) 4.0 - 10.5 K/uL   RBC 3.80 (L) 4.22 - 5.81 MIL/uL   Hemoglobin 9.4 (L) 13.0 - 17.0 g/dL   HCT 69.4 (L) 60.9 - 47.9 %   MCV 80.3 80.0 - 100.0 fL   MCH 24.7 (L) 26.0 - 34.0 pg   MCHC 30.8 30.0 - 36.0 g/dL   RDW 80.9 (H) 88.4 - 84.4 %   Platelets 95 (L) 150 - 400 K/uL    Comment: Immature Platelet Fraction may be clinically indicated, consider ordering this additional test [id number]    nRBC 0.0 0.0 - 0.2 %    Comment: Performed at  [facility], [address], [place], [place] [address]  Brain natriuretic peptide     Status: Abnormal   Collection Time: 05/07/23  4:22 PM  Result Value Ref Range   B Natriuretic Peptide 219.8 (H) 0.0 - 100.0 pg/mL    Comment: Performed at [facility], [address], [place], [place] [address]  SARS Coronavirus 2 by RT PCR (hospital order, performed in [facility] hospital lab) *cepheid single result test* Anterior Nasal Swab     Status: None   Collection Time: 05/08/23 12:01 AM   Specimen: Anterior Nasal Swab  Result Value Ref Range   SARS Coronavirus 2 by RT PCR NEGATIVE NEGATIVE    Comment: (NOTE) SARS-CoV-2 target nucleic acids are NOT DETECTED.  The SARS-CoV-2 RNA is generally detectable in upper and lower respiratory specimens during the acute phase of infection. The lowest concentration of SARS-CoV-2 viral copies this assay can detect is 250 copies / mL. A negative result does not preclude SARS-CoV-2 infection and should not be used as the sole basis for treatment or other patient management decisions.  A negative result may occur with improper specimen collection / handling, submission of specimen other than nasopharyngeal swab, presence of viral mutation(s) within the areas targeted by this assay, and inadequate number of viral copies (<250 copies / mL). A negative result must be combined with clinical observations, patient history, and epidemiological information.  Fact Sheet for Patients:   roadlaptop.co.za  Fact Sheet for Healthcare Providers: http://kim-miller.com/  This test is not yet approved or  cleared by the [place]  FDA and has been authorized for detection and/or diagnosis of SARS-CoV-2 by FDA under an Emergency Use Authorization (EUA).  This EUA will remain in effect (meaning this test can be used) for the duration of the COVID-19 declaration under Section 564(b)(1) of  the Act, 21 U.S.C. section 360bbb-3(b)(1), unless the authorization is terminated or revoked sooner.  Performed at [facility], [address], [place], [place] [address]  POC occult blood, ED     Status: None   Collection Time: 05/08/23 12:14 AM  Result Value Ref Range   Fecal Occult Bld NEGATIVE NEGATIVE  CBG monitoring, ED     Status: Abnormal   Collection Time: 05/08/23  2:14 AM  Result Value Ref Range   Glucose-Capillary 137 (H) 70 - 99 mg/dL    Comment: Glucose reference range applies only to samples taken after fasting for at least 8 hours.  Magnesium      Status: None   Collection Time: 05/08/23  6:24 AM  Result Value Ref Range   Magnesium  2.3 1.7 - 2.4 mg/dL    Comment: Performed at [facility], [address], [place], [place] [address]  Comprehensive metabolic panel     Status: Abnormal   Collection Time: 05/08/23  6:24 AM  Result Value Ref Range   Sodium 135 135 - 145 mmol/L   Potassium 3.9 3.5 - 5.1 mmol/L   Chloride 100 98 - 111 mmol/L   CO2 23 22 - 32 mmol/L   Glucose, Bld 125 (H) 70 - 99 mg/dL    Comment: Glucose reference range applies only to samples taken after fasting for at least 8 hours.   BUN 27 (H) 8 - 23 mg/dL   Creatinine, Ser 7.72 (H) 0.61 - 1.24 mg/dL   Calcium  8.9 8.9 - 10.3 mg/dL   Total Protein 7.8 6.5 - 8.1 g/dL   Albumin  3.5 3.5 - 5.0 g/dL   AST 16 15 - 41 U/L   ALT 9 0 - 44 U/L   Alkaline Phosphatase 50 38 - 126 U/L   Total Bilirubin 1.6 (H) 0.0 - 1.2 mg/dL   GFR, Estimated 31 (L) >60 mL/min    Comment: (NOTE) Calculated using the CKD-EPI Creatinine Equation (2021)    Anion gap 12 5 - 15    Comment: Performed at [facility], [address], [place], [place] [address]  CBC     Status: Abnormal   Collection Time: 05/08/23  6:24 AM  Result Value Ref Range   WBC 3.7 (L) 4.0 - 10.5 K/uL   RBC 3.58 (L) 4.22 - 5.81 MIL/uL   Hemoglobin 9.1 (L) 13.0 - 17.0 g/dL   HCT 71.6 (L) 60.9 -  52.0 %   MCV 79.1 (L) 80.0 - 100.0 fL   MCH 25.4 (L) 26.0 - 34.0 pg   MCHC 32.2 30.0 - 36.0 g/dL   RDW 81.1 (H) 88.4 - 84.4 %   Platelets 98 (L) 150 - 400 K/uL    Comment: SPECIMEN CHECKED FOR CLOTS Immature Platelet Fraction may be clinically indicated, consider ordering this additional test [id number] CONSISTENT WITH PREVIOUS RESULT REPEATED TO VERIFY    nRBC 0.0 0.0 - 0.2 %    Comment: Performed at [facility], [address], [place], [place] [address]  Hemoglobin A1c     Status: None   Collection Time: 05/08/23  6:24 AM  Result Value Ref Range   Hgb A1c MFr Bld 5.3 4.8 - 5.6 %    Comment: (NOTE) Pre diabetes:          5.7%-6.4%  Diabetes:              >6.4%  Glycemic control for   <7.0% adults with diabetes    Mean Plasma Glucose 105.41 mg/dL    Comment: Performed at [facility], [address] [place], [place] [address]  CBG monitoring, ED  Status: Abnormal   Collection Time: 05/08/23  8:01 AM  Result Value Ref Range   Glucose-Capillary 115 (H) 70 - 99 mg/dL    Comment: Glucose reference range applies only to samples taken after fasting for at least 8 hours.  CBG monitoring, ED     Status: Abnormal   Collection Time: 05/08/23 11:53 AM  Result Value Ref Range   Glucose-Capillary 128 (H) 70 - 99 mg/dL    Comment: Glucose reference range applies only to samples taken after fasting for at least 8 hours.  Protime-INR     Status: Abnormal   Collection Time: 05/08/23 12:29 PM  Result Value Ref Range   Prothrombin Time 19.1 (H) 11.4 - 15.2 seconds   INR 1.6 (H) 0.8 - 1.2    Comment: (NOTE) INR goal varies based on device and disease states. Performed at [facility], [address] [place], [place] [address]    DG Chest 2 View Result Date: 05/07/2023 CLINICAL DATA:  Dyspnea on exertion. EXAM: CHEST - 2 VIEW COMPARISON:  Chest radiograph dated 03/25/2023. FINDINGS: There is mild cardiomegaly with mild central vascular  congestion. No focal consolidation, pleural effusion, or pneumothorax. Bilateral hilar prominence suggestive of pulmonary hypertension. Mild atherosclerotic calcification of the aortic arch. No acute osseous pathology. IMPRESSION: Mild cardiomegaly with mild central vascular congestion. No focal consolidation. Electronically Signed   By: Vanetta Chou M.D.   On: 05/07/2023 17:24    Pending Labs Unresulted Labs (From admission, onward)     Start     Ordered   05/08/23 1228  Lactic acid, plasma  (Lactic Acid)  ONCE - STAT,   STAT        05/08/23 1228            Vitals/Pain Today's Vitals   05/08/23 0722 05/08/23 0823 05/08/23 0922 05/08/23 1153  BP:  (!) 135/119 (!) 123/91 (!) 115/96  Pulse:  80 83 63  Resp:  12  16  Temp:  (!) 95 F (35 C)  (!) 97.4 F (36.3 C)  TempSrc:  Oral    SpO2:  94%  97%  Weight:      Height:      PainSc: 8        Isolation Precautions No active isolations  Medications Medications  furosemide  (LASIX ) injection 60 mg (60 mg Intravenous Given 05/08/23 0923)  spironolactone  (ALDACTONE ) tablet 50 mg (50 mg Oral Given 05/08/23 1203)  magnesium  hydroxide (MILK OF MAGNESIA) suspension 30 mL (has no administration in time range)  ondansetron  (ZOFRAN ) tablet 4 mg (has no administration in time range)    Or  ondansetron  (ZOFRAN ) injection 4 mg (has no administration in time range)  folic acid  (FOLVITE ) tablet 1 mg (1 mg Oral Given 05/08/23 0922)  thiamine  (VITAMIN B1) tablet 100 mg (100 mg Oral Given 05/08/23 0922)  pantoprazole  (PROTONIX ) EC tablet 40 mg (40 mg Oral Given 05/08/23 0922)  ferrous sulfate  tablet 325 mg (has no administration in time range)  melatonin tablet 5 mg (5 mg Oral Given 05/08/23 0211)  carvedilol  (COREG ) tablet 3.125 mg (3.125 mg Oral Given 05/08/23 0922)  allopurinol  (ZYLOPRIM ) tablet 100 mg (100 mg Oral Given 05/08/23 1203)  insulin  aspart (novoLOG ) injection 0-6 Units ( Subcutaneous Not Given 05/08/23 1206)  lidocaine  (LIDODERM ) 5 % 1  patch (1 patch Transdermal Patch Applied 05/08/23 0921)  furosemide  (LASIX ) injection 60 mg (60 mg Intravenous Given 05/07/23 2121)  carvedilol  (COREG ) tablet 3.125 mg (3.125 mg Oral Given 05/07/23 2238)  albuterol  (PROVENTIL ) (2.5 MG/3ML) 0.083% nebulizer solution 5 mg (5 mg Nebulization Given 05/07/23 2326)  ipratropium (ATROVENT ) nebulizer solution 0.5 mg (0.5 mg Nebulization Given 05/07/23 2327)  magnesium  sulfate IVPB 2 g 50 mL (0 g Intravenous Stopped 05/08/23 0311)    Mobility walks     Focused Assessments Neuro Assessment Handoff:  Swallow screen pass?  N/A         Neuro Assessment: Within Defined Limits Neuro Checks:      Has TPA been given? No If patient is a Neuro Trauma and patient is going to OR before floor call report to 4N Charge nurse: [phone] or [phone]   R Recommendations: See Admitting Provider Note  Report given to:   Additional Notes:

## 2023-05-08 NOTE — Consult Note (Addendum)
 Cardiology Consultation   Patient ID: John [name] MRN: [id number]; DOB: 06/08/1955  Admit date: 05/07/2023 Date of Consult: 05/08/2023  PCP:  Patient, No Pcp Per   [facility] Providers Cardiologist: used to see Dr Hobart, new to Dr Raford      Patient Profile:   John [name] is a 68 y.o. male with a hx of alcoholic cirrhosis, hepatitis C treated with Epclusa in 2019 , portal gastropathy, esophageal varices, prior GI bleed from gastric varices, GERD, ascites, HFpEF, CKD IIIb, chronic anemia, prior ETOH abuse, HTN, HLD, gout, polysubstance abuse with cocaine and THC, left renal cell carcinoma treated with cryoablation 2022,  who is being seen 05/08/2023 for the evaluation of A fib at the request of Dr Arthea.    History of Present Illness:   [name] with above PMH who presented to ER yesterday for c/o SOB. He states he has noted 2-3 weeks onset of SOB with exertion. Simple activity such as walking would make him winded. He states his abdomen was much more distended and legs are more swollen over the past 2-3 weeks as well. He had hx of alcoholic cirrhosis with ascites, required paracentesis in the past. He said his ascites is not usually bad, maybe need paracentesis once every 10 years. He had stopped drinking ETOH for 11 years. He states he is on lasix  at home, he stopped taking it because he ran out of supply for a few months. He reports know diagnosis of CHF and states he is taking all other heart medications. He denied any chest pain, heart palpitation, dizziness, syncope at home. He said a nurse from [facility] came to his house yesterday to check on him, felt he is more SOB than usual, called EMS for him. He feels overall well, does not feel these symptoms are too bad and did not think he needed to come to the hospital. He denied any hematemesis, melena, BPBPR, hematuria. He reports occasional nose bleeding that is transient and self limiting. He denied hx of  MI or CVA or A fib/flutter. He continue uses cocaine at times.   Per chart review, he followed Dr Hobart last on 11/05/22, has known HFpEF. Echo last on 08/01/22 showed LVEF 55-60%, no RWMA, mild LVH, indeterminate diastolic parameters, normal RV, mild elevated PASP 38.6 mmHg, severe LAE, mild RAE, mild dilatation of the aortic root 41 mm, mild dilatation of the ascending aorta 40 mm. He has had numerous hospitalization in the past for decompensated cirrhosis and CHF. During last office visit, he was felt volume overloaded, advised to take Lasix  60mg  BID, continue spironolactone  50mg  daily and started on jardiance  10 mg daily for GDMT.   He has not been followed by GI, has a hx of alcoholic cirrhosis, complicated by portal hypertension with splenomegaly, esophageal varices, ascites. Most recent upper EGD was from 06/27/20 showed no bleeding, esophageal varices, esophageal stenosis, LA Grade A reflux esophagitis, portal hypertensive gastropathy, gastric varices. He did have hx of GI bleed due to gastric varices in the past where he was treated with BRTO and HE.   He was most  recently hospitalized here 07/2022 for ascites requiring paracentesis. CHF was felt stable at the time.   Labs today showed AKI with Cr 1.6 >>2.27, total bili 1.6, LFT WNL. BNP was 219 yesterday. CBC with WBC 3700, Hgb 9.1, and PLT 98k. FOBT negative.    Past Medical History:  Diagnosis Date   Allergy    Arthritis    oa  back   Asthma    Cataract    both eyes   CHF (congestive heart failure) (HCC) 2001   sees primary for   Chronic back pain    Chronic kidney disease    ckd 3   Cirrhosis (HCC)    alcoholic with h/o varices, ascites   Coronary artery disease    nonobstrucrtibe   ETOH abuse    GERD (gastroesophageal reflux disease)    Gout    Hepatitis    hepatitic c 2018 24 week tx with ecuplipsa, no hepatitic c detected after tx   History of blood transfusion 8-9- yrs ago   Hypertension    Optic neuropathy, left     PAD (peripheral artery disease) (HCC)    slight  primary manages   Polysubstance abuse (HCC)    Renal cell carcinoma (HCC) 2016, 2018 and 2022   left ablation   Seizures [facility])    age 41 none since   Sickle cell trait (HCC)    Stroke (HCC) 1998   light no problems with   Uses walker 12/04/2020   Wears dentures    Wears glasses     Past Surgical History:  Procedure Laterality Date   ARTHRODESIS METATARSALPHALANGEAL JOINT (MTPJ) Right 12/07/2020   Procedure: ARTHRODESIS METATARSALPHALANGEAL JOINT (MTPJ);  Surgeon: Janit Thresa HERO, DPM;  Location: [facility];  Service: Podiatry;  Laterality: Right;   COLONOSCOPY  2021   ESOPHAGOGASTRODUODENOSCOPY N/A 08/14/2014   Procedure: ESOPHAGOGASTRODUODENOSCOPY (EGD);  Surgeon: Gwendlyn ONEIDA Buddy, MD;  Location: [facility];  Service: Endoscopy;  Laterality: N/A;   ESOPHAGOGASTRODUODENOSCOPY (EGD) WITH PROPOFOL  N/A 06/03/2017   Procedure: ESOPHAGOGASTRODUODENOSCOPY (EGD) WITH PROPOFOL ;  Surgeon: Buddy Gwendlyn ONEIDA, MD;  Location: [facility];  Service: Endoscopy;  Laterality: N/A;   HALLUX FUSION Left 07/28/2020   Procedure: HALLUX FUSION MPJ;  Surgeon: Janit Thresa HERO, DPM;  Location: [facility];  Service: Podiatry;  Laterality: Left;   HAMMER TOE SURGERY Left 07/28/2020   Procedure: HAMMER TOE CORRECTION  2,3,AND4 LEFT FOOT;  Surgeon: Janit Thresa HERO, DPM;  Location: [facility];  Service: Podiatry;  Laterality: Left;   HAMMER TOE SURGERY Right 12/07/2020   Procedure: HAMMER TOE CORRECTION  2-4;  Surgeon: Janit Thresa HERO, DPM;  Location: [facility];  Service: Podiatry;  Laterality: Right;   IR RADIOLOGIST EVAL & MGMT  09/25/2016   IR RADIOLOGIST EVAL & MGMT  12/10/2016   IR RADIOLOGIST EVAL & MGMT  03/25/2018   IR RADIOLOGIST EVAL & MGMT  03/24/2019   IR RADIOLOGIST EVAL & MGMT  05/17/2020   IR RADIOLOGIST EVAL & MGMT  08/03/2020   IR RADIOLOGIST EVAL & MGMT  11/22/2020   IR RADIOLOGIST  EVAL & MGMT  05/31/2021   METATARSAL HEAD EXCISION Left 07/28/2020   Procedure: METATARSAL HEAD EXCISION TOES 2,3,AND 4  LEFT FOOT;  Surgeon: Janit Thresa HERO, DPM;  Location: [facility];  Service: Podiatry;  Laterality: Left;   METATARSAL OSTEOTOMY Right 12/07/2020   Procedure: METATARSAL OSTEOTOMY TOES 2-4 RIGHT FOOT;  Surgeon: Janit Thresa HERO, DPM;  Location:  SURGERY CENTER;  Service: Podiatry;  Laterality: Right;   none     RADIOFREQUENCY ABLATION Left 11/08/2016   Procedure: LEFT RENAL CRYOABLATION;  Surgeon: Vanice Sharper, MD;  Location: [facility];  Service: Anesthesiology;  Laterality: Left;   RADIOLOGY WITH ANESTHESIA N/A 08/15/2014   Procedure: RADIOLOGY WITH ANESTHESIA;  Surgeon: Sharper Vanice, MD;  Location: [facility];  Service: Radiology;  Laterality: N/A;   RADIOLOGY WITH ANESTHESIA Left 07/05/2020   Procedure: CT CRYOABLATION;  Surgeon: Vanice Sharper, MD;  Location: [facility];  Service: Anesthesiology;  Laterality: Left;   UPPER GASTROINTESTINAL ENDOSCOPY       Home Medications:  Prior to Admission medications   Medication Sig Start Date End Date Taking? Authorizing Provider  acetaminophen  (TYLENOL ) 325 MG tablet Take 650 mg by mouth every 8 (eight) hours as needed for moderate pain (NOT TO EXCEED 2,000 MG/DAY).   Yes [provider]  allopurinol  (ZYLOPRIM ) 100 MG tablet Take 1 tablet (100 mg total) by mouth daily. 05/12/18  Yes Sebastian Toribio GAILS, MD  atorvastatin  (LIPITOR) 40 MG tablet Take 40 mg by mouth daily. 04/29/19  Yes [provider]  brimonidine  (ALPHAGAN ) 0.15 % ophthalmic solution Place 1 drop into both eyes at bedtime.   Yes [provider]  carvedilol  (COREG ) 3.125 MG tablet Take 1 tablet (3.125 mg total) by mouth 2 (two) times daily with a meal. 08/01/22  Yes Lue Elsie BROCKS, MD  ferrous sulfate  325 (65 FE) MG EC tablet Take 325 mg by mouth every Monday, Wednesday, and Friday.   Yes [provider]  FLOVENT  HFA  220 MCG/ACT inhaler Inhale 2 puffs into the lungs in the morning and at bedtime. 01/27/19  Yes [provider]  furosemide  (LASIX ) 20 MG tablet Take 3 tablets (60 mg total) by mouth 2 (two) times daily. 11/05/22  Yes Hobart Powell BRAVO, MD  ketorolac  (ACULAR ) 0.5 % ophthalmic solution Place 1 drop into the right eye 4 (four) times daily. 03/20/23  Yes [provider]  lidocaine  (LIDODERM ) 5 % Place 1 patch onto the skin daily. Remove & Discard patch within 12 hours or as directed by MD 11/08/22  Yes Randol Simmonds, MD  melatonin 5 MG TABS Take 5 mg by mouth at bedtime.   Yes [provider]  Menthol -Methyl Salicylate  (SALONPAS  PAIN RELIEF  PATCH) PTCH Apply 1 patch topically daily as needed (for pain- affected area).   Yes [provider]  ofloxacin (OCUFLOX) 0.3 % ophthalmic solution Place 1 drop into the right eye 4 (four) times daily. 03/20/23  Yes [provider]  omeprazole  (PRILOSEC) 40 MG capsule TAKE ONE CAPSULE BY MOUTH DAILY Patient taking differently: Take 40 mg by mouth daily before breakfast. 07/02/18  Yes Aneita Gwendlyn DASEN, MD  potassium chloride  (KLOR-CON ) 10 MEQ tablet Take 10 mEq by mouth daily.   Yes [provider]  prednisoLONE acetate (PRED FORTE) 1 % ophthalmic suspension Place 1 drop into the right eye 4 (four) times daily. 03/20/23  Yes [provider]  PROAIR  HFA 108 (90 Base) MCG/ACT inhaler Inhale 2 puffs into the lungs every 6 (six) hours as needed for wheezing or shortness of breath. 10/02/17  Yes [provider]  spironolactone  (ALDACTONE ) 50 MG tablet Take 1 tablet (50 mg total) by mouth daily. 08/01/22  Yes Lue Elsie BROCKS, MD  terbinafine (LAMISIL) 1 % cream Apply 1 Application topically See admin instructions. Apply to feet as directed every other day   Yes [provider]  empagliflozin  (JARDIANCE ) 10 MG TABS tablet Take 1 tablet (10 mg total) by mouth daily before breakfast. 11/05/22   Hobart Powell BRAVO, MD    Inpatient Medications: Scheduled Meds:  allopurinol   100 mg Oral Daily   carvedilol   3.125 mg Oral BID WC   [START ON 05/09/2023] ferrous sulfate   325 mg Oral Q M,W,F   folic acid   1 mg Oral  Daily   furosemide   60 mg Intravenous Q12H   insulin  aspart  0-6 Units Subcutaneous TID WC   lidocaine   1 patch Transdermal Q24H   melatonin  5 mg Oral QHS   pantoprazole   40 mg Oral Daily   spironolactone   50 mg Oral Daily   thiamine   100 mg Oral Daily   Continuous Infusions:  PRN Meds: magnesium  hydroxide, ondansetron  **OR** ondansetron  (ZOFRAN ) IV  Allergies:    Allergies  Allergen Reactions   Penicillins Other (See Comments)    Convulsions, Patient could not walk, childhood allergy   Has patient had a PCN reaction causing immediate rash, facial/tongue/throat swelling, SOB or lightheadedness with hypotension: No Has patient had a PCN reaction causing severe rash involving mucus membranes or skin necrosis: No Has patient had a PCN reaction that required hospitalization No Has patient had a PCN reaction occurring within the last 10 years: No If all of the above answers are NO, then may proceed with Cephalosporin use.    Social History:   Social History   Socioeconomic History   Marital status: Single    Spouse name: Not on file   Number of children: 0   Years of education: Not on file   Highest education level: Not on file  Occupational History   Occupation: retired  Tobacco Use   Smoking status: Former    Current packs/day: 0.00    Average packs/day: 0.3 packs/day for 20.0 years (6.0 ttl pk-yrs)    Types: Cigarettes    Start date: 04/30/1991    Quit date: 04/30/2011    Years since quitting: 12.0   Smokeless tobacco: Never   Tobacco comments:    2015  Vaping Use   Vaping status: Never Used  Substance and Sexual Activity   Alcohol use: No    Alcohol/week: 0.0 standard drinks of alcohol    Comment: stopped 8-9 yrs ago   Drug use: Yes    Types: Cocaine,  Marijuana    Comment: cocaine last used 11-28-2020 and marijuan last used 11-28-2020   Sexual activity: Not Currently  Other Topics Concern   Not on file  Social History Narrative   Lives with niece in a 2 story home.     On disability since 2015 for low back pain.  Used to work environmental manager.    Highest level of education: 11th grade   Social Drivers of Corporate Investment Banker Strain: Not on file  Food Insecurity: No Food Insecurity (08/06/2022)   Hunger Vital Sign    Worried About Running Out of Food in the Last Year: Never true    Ran Out of Food in the Last Year: Never true  Transportation Needs: No Transportation Needs (08/06/2022)   PRAPARE - Administrator, Civil Service (Medical): No    Lack of Transportation (Non-Medical): No  Physical Activity: Not on file  Stress: Not on file  Social Connections: Unknown (09/10/2021)   [facility]   Social Network    Social Network: Not on file  Intimate Partner Violence: Not At Risk (08/06/2022)   Humiliation, Afraid, Rape, and Kick questionnaire    Fear of Current or Ex-Partner: No    Emotionally Abused: No    Physically Abused: No    Sexually Abused: No    Family History:    Family History  Problem Relation Age of Onset   Hypertension Mother        Living   Kidney disease Mother  Diabetes Mother    Heart disease Mother    Hypertension Father        Deceased, 54   Ulcers Father    Stomach cancer Father    Hypertension Brother    Diabetes Brother    Kidney disease Brother    Hypertension Sister    Colon cancer Neg Hx    Esophageal cancer Neg Hx    Rectal cancer Neg Hx      ROS:  Constitutional: Denied fever, chills, malaise, night sweats Eyes: Denied vision change or loss Ears/Nose/Mouth/Throat: Denied ear ache, sore throat, coughing, sinus pain Cardiovascular: see HPI  Respiratory: see HPI  Gastrointestinal: see HPI  Genital/Urinary: Denied dysuria, hematuria, urinary  frequency/urgency Musculoskeletal: Denied muscle ache, joint pain, weakness Skin: Denied rash, wound Neuro: Denied headache, dizziness, syncope Psych: Denied history of depression/anxiety  Endocrine: Denied history of diabetes     Physical Exam/Data:   Vitals:   05/08/23 0500 05/08/23 0823 05/08/23 0922 05/08/23 1153  BP: 101/87 (!) 135/119 (!) 123/91 (!) 115/96  Pulse: 83 80 83 63  Resp: 18 12  16   Temp:  (!) 95 F (35 C)  (!) 97.4 F (36.3 C)  TempSrc:  Oral    SpO2: 97% 94%  97%  Weight:      Height:        Intake/Output Summary (Last 24 hours) at 05/08/2023 1213 Last data filed at 05/08/2023 0311 Gross per 24 hour  Intake 32 ml  Output 1050 ml  Net -1018 ml      05/07/2023    3:39 PM 11/08/2022    5:17 PM 11/05/2022   12:48 PM  Last 3 Weights  Weight (lbs) 224 lb 13.9 oz 224 lb 13.9 oz 227 lb  Weight (kg) 102 kg 102 kg 102.967 kg     Body mass index is 34.19 kg/m.   Vitals:  Vitals:   05/08/23 0922 05/08/23 1153  BP: (!) 123/91 (!) 115/96  Pulse: 83 63  Resp:  16  Temp:  (!) 97.4 F (36.3 C)  SpO2:  97%   General Appearance: In no apparent distress, laying in bed, well nourished  HEENT: Normocephalic, atraumatic.  Neck: Supple, trachea midline, + JVDs Cardiovascular: Irregular, S1S2, no murmur  Respiratory: Resting breathing unlabored, lungs sounds with crackles at base bilaterally  Gastrointestinal: Bowel sounds positive,abdomen distended, + fluid wave  Extremities: Able to move all extremities in bed without difficulty, 1+ pitting edema of BLE  Musculoskeletal: Normal muscle bulk and tone Skin: Intact, warm, dry. No rashes or petechiae noted in exposed areas.  Neurologic: Alert, oriented to person, place and time. no gross focal neuro deficit Psychiatric: Normal affect. Mood is appropriate.    EKG:  The EKG was personally reviewed and demonstrates:    EKG from 05/06/22 15:53 Atrial flutter with ventricular rate of 69 bpm, variable AVB,  inferolateral  TWI  Telemetry:  Telemetry was personally reviewed and demonstrates:    Atrial Fib/flutter RVR up to 120s, various AV block   Relevant CV Studies:   Echo from 08/01/22:  1. Left ventricular ejection fraction, by estimation, is 55 to 60%. The  left ventricle has normal function. The left ventricle has no regional  wall motion abnormalities. There is mild concentric left ventricular  hypertrophy. Left ventricular diastolic  parameters are indeterminate.   2. Right ventricular systolic function is normal. The right ventricular  size is normal. There is mildly elevated pulmonary artery systolic  pressure.   3. Left atrial size was  severely dilated.   4. Right atrial size was mildly dilated.   5. The mitral valve is normal in structure. No evidence of mitral valve  regurgitation. No evidence of mitral stenosis.   6. The aortic valve is normal in structure. Aortic valve regurgitation is  not visualized. No aortic stenosis is present.   7. Aortic dilatation noted. There is mild dilatation of the aortic root,  measuring 41 mm. There is mild dilatation of the ascending aorta,  measuring 40 mm.   8. The inferior vena cava is dilated in size with <50% respiratory  variability, suggesting right atrial pressure of 15 mmHg.     Laboratory Data:  High Sensitivity Troponin:  No results for input(s): TROPONINIHS in the last 720 hours.   Chemistry Recent Labs  Lab 05/07/23 1622 05/08/23 0624  NA 136 135  K 4.0 3.9  CL 104 100  CO2 22 23  GLUCOSE 82 125*  BUN 23 27*  CREATININE 1.60* 2.27*  CALCIUM  9.2 8.9  MG  --  2.3  GFRNONAA 47* 31*  ANIONGAP 10 12    Recent Labs  Lab 05/08/23 0624  PROT 7.8  ALBUMIN  3.5  AST 16  ALT 9  ALKPHOS 50  BILITOT 1.6*   Lipids No results for input(s): CHOL, TRIG, HDL, LABVLDL, LDLCALC, CHOLHDL in the last 168 hours.  Hematology Recent Labs  Lab 05/07/23 1622 05/08/23 0624  WBC 3.4* 3.7*  RBC 3.80* 3.58*  HGB 9.4* 9.1*   HCT 30.5* 28.3*  MCV 80.3 79.1*  MCH 24.7* 25.4*  MCHC 30.8 32.2  RDW 19.0* 18.8*  PLT 95* 98*   Thyroid No results for input(s): TSH, FREET4 in the last 168 hours.  BNP Recent Labs  Lab 05/07/23 1622  BNP 219.8*    DDimer No results for input(s): DDIMER in the last 168 hours.   Radiology/Studies:  DG Chest 2 View Result Date: 05/07/2023 CLINICAL DATA:  Dyspnea on exertion. EXAM: CHEST - 2 VIEW COMPARISON:  Chest radiograph dated 03/25/2023. FINDINGS: There is mild cardiomegaly with mild central vascular congestion. No focal consolidation, pleural effusion, or pneumothorax. Bilateral hilar prominence suggestive of pulmonary hypertension. Mild atherosclerotic calcification of the aortic arch. No acute osseous pathology. IMPRESSION: Mild cardiomegaly with mild central vascular congestion. No focal consolidation. Electronically Signed   By: Vanetta Chou M.D.   On: 05/07/2023 17:24     Assessment and Plan:   Atrial flutter, new diagnosis  - presented with 2 weeks worsening DOE, abdomen swelling, BLE swelling  - BNP 219 (was 493 on 11/08/22) - CXR with mild vascular congestion, no overt CHF - EKG and telemetry showing rate controlled atrial flutter - unclear duration, does not seems to have significant symptoms, DOE likely due to worsening ascites - he does not require AVN block agent for rate control at this time, if needed, coreg  can be up-titrated as this is a good selection given his HTN, portal HTN, and cocaine abuse hx  -  CHA2DS2-VASc Score = 3  for CHF, HTN, and age,ideally recommend anticoagulation with DOAC, however he is at high risk of bleeding due to underlying cirrhosis with esophageal and gastric varices, cirrhosis appears compensated, would check INR today, Child-Pugh score 8 and MELD-Na 22 based on labs today and 07/2022 INR. Would not start anticoagulation at this time after discussing with Dr Raford due to high risk of bleeding, will refer to outpatient EP for  Watchman evaluation   Acute on Chronic HFpEF - BNP as above - CXR  as as above - clinically appears volume overloaded, suspect picture is cirrhosis driven, labs with AKI on CKD III today after receiving IV Lasix  60mg  x1, UOP 1L over the past 24 hours, will check lactic acid today, seems warm and well perfused at this time  - Echo from 07/2022 with LVEF 55-60% - GDMT: continue coreg  3.125mg  BID; will resume Lasix  60mg  BID, he ran out of lasix  for several months; if AKI resolves, would resume PTA jardiance  10mg , spironolactone  50mg   ETOH cirrhosis AKI on CKD III Portal HTN Esophageal and gastric varices  Ascites  HTN HLD Anemia  Thrombocytopenia  - per primary team     Risk Assessment/Risk Scores:   New York  Heart Association (NYHA) Functional Class NYHA Class III  CHA2DS2-VASc Score = 3   This indicates a 3.2% annual risk of stroke. The patient's score is based upon: CHF History: 1 HTN History: 1 Diabetes History: 0 Stroke History: 0 Vascular Disease History: 0 Age Score: 1 Gender Score: 0   For questions or updates, please contact  [facility] Please consult [email or web address] for contact info under    Signed, Loghan Kurtzman, NP  05/08/2023 12:13 PM

## 2023-05-08 NOTE — Care Plan (Signed)
 This 68 yrs old male with medical history significant for CKD ,  alcoholic cirrhosis with variceal bleeding and paracenteses many years ago.  He quit drinking alcohol greater than 10 years ago.  He continues to have a cocaine habit but he is compliant with his medications. He reports running out of his diuretics for several months.  He reports worsening abdominal distention and bilateral leg swelling.  Evaluation in the ED reveals new onset atrial flutter/fibrillation. Patient was admitted for further evaluation.  Cardiology is consulted.  Medication adjusted.  Patient was seen and examined at bedside. He reports feeling better.

## 2023-05-09 ENCOUNTER — Inpatient Hospital Stay (HOSPITAL_COMMUNITY): Payer: No Typology Code available for payment source

## 2023-05-09 DIAGNOSIS — I4891 Unspecified atrial fibrillation: Secondary | ICD-10-CM | POA: Diagnosis not present

## 2023-05-09 LAB — CBC
HCT: 27.8 % — ABNORMAL LOW (ref 39.0–52.0)
Hemoglobin: 8.8 g/dL — ABNORMAL LOW (ref 13.0–17.0)
MCH: 24.6 pg — ABNORMAL LOW (ref 26.0–34.0)
MCHC: 31.7 g/dL (ref 30.0–36.0)
MCV: 77.7 fL — ABNORMAL LOW (ref 80.0–100.0)
Platelets: 100 10*3/uL — ABNORMAL LOW (ref 150–400)
RBC: 3.58 MIL/uL — ABNORMAL LOW (ref 4.22–5.81)
RDW: 18.6 % — ABNORMAL HIGH (ref 11.5–15.5)
WBC: 6.5 10*3/uL (ref 4.0–10.5)
nRBC: 0 % (ref 0.0–0.2)

## 2023-05-09 LAB — BASIC METABOLIC PANEL
Anion gap: 9 (ref 5–15)
BUN: 33 mg/dL — ABNORMAL HIGH (ref 8–23)
CO2: 24 mmol/L (ref 22–32)
Calcium: 8.9 mg/dL (ref 8.9–10.3)
Chloride: 103 mmol/L (ref 98–111)
Creatinine, Ser: 2.16 mg/dL — ABNORMAL HIGH (ref 0.61–1.24)
GFR, Estimated: 33 mL/min — ABNORMAL LOW (ref >60)
Glucose, Bld: 106 mg/dL — ABNORMAL HIGH (ref 70–99)
Potassium: 3.8 mmol/L (ref 3.5–5.1)
Sodium: 136 mmol/L (ref 135–145)

## 2023-05-09 LAB — GLUCOSE, CAPILLARY
Glucose-Capillary: 97 mg/dL (ref 70–99)
Glucose-Capillary: 86 mg/dL (ref 70–99)
Glucose-Capillary: 91 mg/dL (ref 70–99)
Glucose-Capillary: 95 mg/dL (ref 70–99)

## 2023-05-09 LAB — FOLATE: Folate: 10.1 ng/mL (ref >5.9)

## 2023-05-09 LAB — BODY FLUID CELL COUNT WITH DIFFERENTIAL
Eos, Fluid: 0 %
Lymphs, Fluid: 4 %
Monocyte-Macrophage-Serous Fluid: 75 % (ref 50–90)
Neutrophil Count, Fluid: 21 % (ref 0–25)
Total Nucleated Cell Count, Fluid: 181 uL (ref 0–1000)

## 2023-05-09 LAB — VITAMIN B12: Vitamin B-12: 352 pg/mL (ref 180–914)

## 2023-05-09 LAB — PHOSPHORUS: Phosphorus: 2.8 mg/dL (ref 2.5–4.6)

## 2023-05-09 LAB — IRON AND TIBC
Iron: 28 ug/dL — ABNORMAL LOW (ref 45–182)
Saturation Ratios: 8 % — ABNORMAL LOW (ref 17.9–39.5)
TIBC: 356 ug/dL (ref 250–450)
UIBC: 328 ug/dL

## 2023-05-09 LAB — ALBUMIN, PLEURAL OR PERITONEAL FLUID: Albumin, Fluid: 2 g/dL

## 2023-05-09 LAB — VITAMIN D 25 HYDROXY (VIT D DEFICIENCY, FRACTURES): Vit D, 25-Hydroxy: 18.15 ng/mL — ABNORMAL LOW (ref 30–100)

## 2023-05-09 LAB — MAGNESIUM: Magnesium: 2.1 mg/dL (ref 1.7–2.4)

## 2023-05-09 MED ORDER — LIDOCAINE HCL 1 % IJ SOLN
INTRAMUSCULAR | Status: AC
  Filled 2023-05-09: qty 20

## 2023-05-09 MED ORDER — ALBUMIN HUMAN 25 % IV SOLN
12.5000 g | Freq: Four times a day (QID) | INTRAVENOUS | Status: AC
  Administered 2023-05-09 – 2023-05-10 (×3): 12.5 g via INTRAVENOUS
  Filled 2023-05-09 (×3): qty 50

## 2023-05-09 NOTE — Procedures (Signed)
 PROCEDURE SUMMARY:  Successful ultrasound guided paracentesis from the right mid to lower quadrant.  Yielded 5.2 liters of hazy,amber fluid.  No immediate complications.  The patient tolerated the procedure well.   Specimen was sent for labs.  EBL none  If the patient eventually requires >/=2 paracenteses in a 30 day period, screening evaluation by the [facility] will be assessed.   Kevin Nyemah Watton,PA-C

## 2023-05-09 NOTE — Progress Notes (Signed)
 Call received from central telemetry reporting 2.1 second pause. Upon assessment, pt denies symptoms. VS obtained, documented and all reported to Dr Lucianne Muss. Pt's current RN, Sam, notified as well.

## 2023-05-09 NOTE — Progress Notes (Signed)
 Pt has been extensively educated on the risk of injury from falling. Pt refuses to call for assistance. Pt repeatedly states he is not going to fall. Despite the extensive fall risk and safety injury education, pt insists on the bed alarm being turned off.

## 2023-05-09 NOTE — Plan of Care (Signed)

## 2023-05-09 NOTE — Progress Notes (Signed)
 Rounding Note    Patient Name: John [name] Date of Encounter: 05/09/2023  [facility] Cardiologist: Annabella Scarce, MD   Subjective   Pt states his LE swelling is much improved bu abdomen remains significantly distended  Inpatient Medications    Scheduled Meds:  allopurinol   100 mg Oral Daily   carvedilol   3.125 mg Oral BID WC   ferrous sulfate   325 mg Oral Q M,W,F   folic acid   1 mg Oral Daily   furosemide   60 mg Intravenous Q12H   insulin  aspart  0-6 Units Subcutaneous TID WC   lidocaine   1 patch Transdermal Q24H   melatonin  5 mg Oral QHS   pantoprazole   40 mg Oral Daily   spironolactone   50 mg Oral Daily   thiamine   100 mg Oral Daily   Continuous Infusions:  PRN Meds: magnesium  hydroxide, ondansetron  **OR** ondansetron  (ZOFRAN ) IV   Vital Signs    Vitals:   05/08/23 1840 05/08/23 2230 05/09/23 0253 05/09/23 0820  BP: 106/80 104/89 109/86 106/81  Pulse: 94 85 85 80  Resp: 16 18 18  (!) 21  Temp:  98.2 F (36.8 C) 97.9 F (36.6 C)   TempSrc:  Oral Oral   SpO2: 98% 93% 98%   Weight:      Height:        Intake/Output Summary (Last 24 hours) at 05/09/2023 1102 Last data filed at 05/09/2023 0700 Gross per 24 hour  Intake --  Output 300 ml  Net -300 ml      05/07/2023    3:39 PM 11/08/2022    5:17 PM 11/05/2022   12:48 PM  Last 3 Weights  Weight (lbs) 224 lb 13.9 oz 224 lb 13.9 oz 227 lb  Weight (kg) 102 kg 102 kg 102.967 kg      Telemetry    Rate controlled flutter and coarse Afib 60-80s - Personally Reviewed  ECG    No new tracings - Personally Reviewed  Physical Exam   GEN: No acute distress.   Neck: + JVD Cardiac: irregular rhythm, regular rate Respiratory: Clear to auscultation bilaterally. GI: tight, distended, mildly tender MS: mild B LE edema, dry Neuro:  Nonfocal  Psych: Normal affect   Labs    High Sensitivity Troponin:  No results for input(s): TROPONINIHS in the last 720 hours.   Chemistry Recent Labs   Lab 05/07/23 1622 05/08/23 0624 05/09/23 0405  NA 136 135 136  K 4.0 3.9 3.8  CL 104 100 103  CO2 22 23 24   GLUCOSE 82 125* 106*  BUN 23 27* 33*  CREATININE 1.60* 2.27* 2.16*  CALCIUM  9.2 8.9 8.9  MG  --  2.3 2.1  PROT  --  7.8  --   ALBUMIN   --  3.5  --   AST  --  16  --   ALT  --  9  --   ALKPHOS  --  50  --   BILITOT  --  1.6*  --   GFRNONAA 47* 31* 33*  ANIONGAP 10 12 9     Lipids No results for input(s): CHOL, TRIG, HDL, LABVLDL, LDLCALC, CHOLHDL in the last 168 hours.  Hematology Recent Labs  Lab 05/07/23 1622 05/08/23 0624 05/09/23 0405  WBC 3.4* 3.7* 6.5  RBC 3.80* 3.58* 3.58*  HGB 9.4* 9.1* 8.8*  HCT 30.5* 28.3* 27.8*  MCV 80.3 79.1* 77.7*  MCH 24.7* 25.4* 24.6*  MCHC 30.8 32.2 31.7  RDW 19.0* 18.8* 18.6*  PLT 95*  98* 100*   Thyroid No results for input(s): TSH, FREET4 in the last 168 hours.  BNP Recent Labs  Lab 05/07/23 1622  BNP 219.8*    DDimer No results for input(s): DDIMER in the last 168 hours.   Radiology    DG Chest 2 View Result Date: 05/07/2023 CLINICAL DATA:  Dyspnea on exertion. EXAM: CHEST - 2 VIEW COMPARISON:  Chest radiograph dated 03/25/2023. FINDINGS: There is mild cardiomegaly with mild central vascular congestion. No focal consolidation, pleural effusion, or pneumothorax. Bilateral hilar prominence suggestive of pulmonary hypertension. Mild atherosclerotic calcification of the aortic arch. No acute osseous pathology. IMPRESSION: Mild cardiomegaly with mild central vascular congestion. No focal consolidation. Electronically Signed   By: Vanetta Chou M.D.   On: 05/07/2023 17:24    Cardiac Studies   Echo 07/2022:  1. Left ventricular ejection fraction, by estimation, is 55 to 60%. The  left ventricle has normal function. The left ventricle has no regional  wall motion abnormalities. There is mild concentric left ventricular  hypertrophy. Left ventricular diastolic  parameters are indeterminate.   2. Right  ventricular systolic function is normal. The right ventricular  size is normal. There is mildly elevated pulmonary artery systolic  pressure.   3. Left atrial size was severely dilated.   4. Right atrial size was mildly dilated.   5. The mitral valve is normal in structure. No evidence of mitral valve  regurgitation. No evidence of mitral stenosis.   6. The aortic valve is normal in structure. Aortic valve regurgitation is  not visualized. No aortic stenosis is present.   7. Aortic dilatation noted. There is mild dilatation of the aortic root,  measuring 41 mm. There is mild dilatation of the ascending aorta,  measuring 40 mm.   8. The inferior vena cava is dilated in size with <50% respiratory  variability, suggesting right atrial pressure of 15 mmHg.   Patient Profile     68 y.o. male with a hx of alcoholic cirrhosis, hepatitis C treated with Epclusa in 2019 , portal gastropathy, esophageal varices, prior GI bleed from gastric varices, GERD, ascites, HFpEF, CKD IIIb, chronic anemia, prior ETOH abuse, HTN, HLD, gout, polysubstance abuse with cocaine and THC, left renal cell carcinoma treated with cryoablation 2022,  who is being seen 05/08/2023 for the evaluation of A fib   Assessment & Plan    Atrial flutter - new diagnosis this admission - asymptomatic - currently rate controlled flutter with ?coarse fib on 3.125 mg coreg  BID - could be a watchman candidate but will still need to tolerate a short course of OAC vs antiplatelet therapy   Acute on chronic diastolic heart failure Medication noncompliance - echo 07/2022 as above - pt has been out of lasix  for several months - complicated by underlying cirrhosis - agree with 50 mg spironolactone  - diuresing on 60 mg IV lasix  BID with 1 L urine output yesterday - LE edema much improved, per patient - continue today - need updated weight - suspect he may need paracentesis  - scr 2.15 - ?down-trending   Cirrhosis, esophageal and  gastric varices, prior GI bleed - would be high bleeding risk with OAC      For questions or updates, please contact [facility] Please consult [email or web address] for contact info under        Signed, Jon Nat Hails, PA  05/09/2023, 11:02 AM

## 2023-05-09 NOTE — Progress Notes (Addendum)
 [facility] Progress Note  Patient: John [name]    FMW:[id number]  DOA: 05/07/2023     Date of Service: the patient was seen and examined on 05/09/2023  Chief Complaint  Patient presents with   fluid retention   Brief hospital course: CHAD DONOGHUE is a 68 y.o. male with medical history significant  with history of CKD and alcoholic cirrhosis with variceal bleeding and paracenteses many years ago.  He quit drinking alcohol greater than 10 years ago.  He continues to have a cocaine habit but he is compliant with his medication when he has them.  He reports that he has been out of his diuretic for several months.  He gets all of his medication by mail and for some reason that medication was no longer sent to him.  He did not investigate why.  The swelling has been worsening over the last week.  It got to the point where it was very painful in his legs and his abdomen has become huge. Evaluation in the emergency department include included blood work and an EKG.  The EKG revealed new onset A-fib and flutter.  The hospitalist will be admitting the patient for his volume overload and new A-fib. The patient does plan to quit cocaine.  He has a little great-niece and he says he wants to live to be around to see her grow up.    Assessment and Plan:  # New onset afib/ flutter -his heart rate is not elevated. CHA2DS2-VASc 3 score, Seen by cardiology, recommended no DOAC due to high risk for bleeding secondary to liver cirrhosis and esophageal varices.  Low risk for stroke. Continue Coreg  3.125 mg p.o. twice daily Patient can be evaluated for Watchman device as an outpatient, but still patient needs to tolerate short course of DOAC. Follow TTE Follow cardiology for further recommendation  # Ascites due to liver cirrhosis and lower extremity edema # History of cirrhosis.  Esophageal and gastric varices, history of prior GI bleed Presented with volume overload - the patient says his  last paracentesis was greater than 5 years ago.  He has been noncompliant with his Lasix . Continue PPI for GI prophylaxis - Diurese aggressively - Paracentesis  # AKI on CKD stage IIIa On 11/05/22 creatinine 1.43 CKD stage IIIa Creatinine 2.27---2.16 Monitor renal functions as patient is on diuresis.   # Hypophosphatemia, Phos repleted. # Hypomagnesemia - Replete # H/o Gout -  continue allopurinol  # DMT2 - corrective dose insulin  for now # Cocaine abuse, drug abuse abstinence counseling done.  Watch for withdrawal symptoms.  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  Interventions:  Diet: Carb modified diet, fluid striction 1.5 L/day DVT Prophylaxis: SCD, pharmacological prophylaxis contraindicated due to risk of bleeding    Advance goals of care discussion: Full code  Family Communication: family was not present at bedside, at the time of interview.  The pt provided permission to discuss medical plan with the family. Opportunity was given to ask question and all questions were answered satisfactorily.   Disposition:  Pt is from Home, admitted with volume overload, ascites, still has ascites, scheduled for paracentesis today, on IV diuretics, which precludes a safe discharge. Discharge to Home, when stable, may need few days to improve.  Subjective: No significant events overnight, lower extremity edema is improving, patient still has distended belly, no abdominal pain.  Denied any chest pain or palpitation, no shortness of breath. Patient understand that he is scheduled for paracentesis today.  Physical Exam: General: NAD, lying comfortably Appear in no distress, affect appropriate Eyes: PERRLA ENT: Oral Mucosa Clear, moist  Neck: no JVD,  Cardiovascular: S1 and S2 Present, no Murmur,  Respiratory: good respiratory effort,  Bilateral Air entry equal and Decreased, no Crackles, no wheezes Abdomen: Bowel Sound present, distended due to ascites, firm but no tenderness,  Skin: no rashes Extremities: 1-2+ Pedal edema, no calf tenderness Neurologic: without any new focal findings Gait not checked due to patient safety concerns  Vitals:   05/09/23 1345 05/09/23 1348 05/09/23 1353 05/09/23 1357  BP: 108/76 102/78 94/71 100/75  Pulse:      Resp:      Temp:      TempSrc:      SpO2:      Weight:      Height:        Intake/Output Summary (Last 24 hours) at 05/09/2023 1501 Last data filed at 05/09/2023 0900 Gross per 24 hour  Intake 120 ml  Output 300 ml  Net -180 ml   Filed Weights   05/07/23 1539  Weight: 102 kg    Data Reviewed: I have personally reviewed and interpreted daily labs, tele strips, imagings as discussed above. I reviewed all nursing notes, pharmacy notes, vitals, pertinent old records I have discussed plan of care as described above with RN and patient/family.  CBC: Recent Labs  Lab 05/07/23 1622 05/08/23 0624 05/09/23 0405  WBC 3.4* 3.7* 6.5  HGB 9.4* 9.1* 8.8*  HCT 30.5* 28.3* 27.8*  MCV 80.3 79.1* 77.7*  PLT 95* 98* 100*   Basic Metabolic Panel: Recent Labs  Lab 05/07/23 1622 05/08/23 0624 05/09/23 0405  NA 136 135 136  K 4.0 3.9 3.8  CL 104 100 103  CO2 22 23 24   GLUCOSE 82 125* 106*  BUN 23 27* 33*  CREATININE 1.60* 2.27* 2.16*  CALCIUM  9.2 8.9 8.9  MG  --  2.3 2.1  PHOS  --   --  2.8    Studies: No results found.  Scheduled Meds:  allopurinol   100 mg Oral Daily   carvedilol   3.125 mg Oral BID WC   ferrous sulfate   325 mg Oral Q M,W,F   folic acid   1 mg Oral Daily   furosemide   60 mg Intravenous Q12H   insulin  aspart  0-6 Units Subcutaneous TID WC   lidocaine   1 patch Transdermal Q24H   melatonin  5 mg Oral QHS   pantoprazole   40 mg Oral Daily   spironolactone   50 mg Oral Daily   thiamine   100 mg Oral Daily   Continuous Infusions: PRN Meds:  magnesium  hydroxide, ondansetron  **OR** ondansetron  (ZOFRAN ) IV  Time spent: 35 minutes  Author: ELVAN SOR. MD [facility] 05/09/2023 3:01 PM  To reach On-call, see care teams to locate the attending and reach out to them via [email or web address]. If 7PM-7AM, please contact night-coverage If you still have difficulty reaching the attending provider, please page the [facility] (Director on Call) for [facility] on amion for assistance.

## 2023-05-09 NOTE — Plan of Care (Signed)
  Problem: Education: Goal: Ability to describe self-care measures that may prevent or decrease complications (Diabetes Survival Skills Education) will improve 05/09/2023 0113 by Lavina Hermina CROME, RN Outcome: Progressing 05/08/2023 1616 by Lavina Hermina CROME, RN Outcome: Progressing Goal: Individualized Educational Video(s) 05/09/2023 0113 by Lavina Hermina CROME, RN Outcome: Progressing 05/08/2023 1616 by Lavina Hermina CROME, RN Outcome: Progressing   Problem: Coping: Goal: Ability to adjust to condition or change in health will improve 05/09/2023 0113 by Lavina Hermina CROME, RN Outcome: Progressing 05/08/2023 1616 by Lavina Hermina CROME, RN Outcome: Progressing   Problem: Fluid Volume: Goal: Ability to maintain a balanced intake and output will improve 05/09/2023 0113 by Lavina Hermina CROME, RN Outcome: Progressing 05/08/2023 1616 by Lavina Hermina CROME, RN Outcome: Progressing   Problem: Health Behavior/Discharge Planning: Goal: Ability to identify and utilize available resources and services will improve 05/09/2023 0113 by Lavina Hermina CROME, RN Outcome: Progressing 05/08/2023 1616 by Lavina Hermina CROME, RN Outcome: Progressing Goal: Ability to manage health-related needs will improve 05/09/2023 0113 by Lavina Hermina CROME, RN Outcome: Progressing 05/08/2023 1616 by Lavina Hermina CROME, RN Outcome: Progressing   Problem: Metabolic: Goal: Ability to maintain appropriate glucose levels will improve 05/09/2023 0113 by Lavina Hermina CROME, RN Outcome: Progressing 05/08/2023 1616 by Lavina Hermina CROME, RN Outcome: Progressing   Problem: Nutritional: Goal: Maintenance of adequate nutrition will improve 05/09/2023 0113 by Lavina Hermina CROME, RN Outcome: Progressing 05/08/2023 1616 by Lavina Hermina CROME, RN Outcome: Progressing Goal: Progress toward achieving an optimal weight will improve 05/09/2023 0113 by Lavina Hermina CROME, RN Outcome: Progressing 05/08/2023 1616 by Lavina Hermina CROME, RN Outcome: Progressing   Problem: Skin  Integrity: Goal: Risk for impaired skin integrity will decrease 05/09/2023 0113 by Lavina Hermina CROME, RN Outcome: Progressing 05/08/2023 1616 by Lavina Hermina CROME, RN Outcome: Progressing

## 2023-05-10 ENCOUNTER — Inpatient Hospital Stay (HOSPITAL_COMMUNITY): Payer: No Typology Code available for payment source

## 2023-05-10 DIAGNOSIS — I4891 Unspecified atrial fibrillation: Secondary | ICD-10-CM

## 2023-05-10 LAB — ECHOCARDIOGRAM COMPLETE
AR max vel: 3.61 cm2
AV Area VTI: 3.37 cm2
AV Area mean vel: 2.99 cm2
AV Mean grad: 1 mm[Hg]
AV Peak grad: 1.9 mm[Hg]
Ao pk vel: 0.7 m/s
Area-P 1/2: 6.17 cm2
Calc EF: 52.7 %
Height: 68 in
MV VTI: 3.03 cm2
S' Lateral: 3.3 cm
Single Plane A2C EF: 60.4 %
Single Plane A4C EF: 50.7 %
Weight: 3612.02 [oz_av]

## 2023-05-10 LAB — BASIC METABOLIC PANEL
Anion gap: 10 (ref 5–15)
BUN: 39 mg/dL — ABNORMAL HIGH (ref 8–23)
CO2: 25 mmol/L (ref 22–32)
Calcium: 8.8 mg/dL — ABNORMAL LOW (ref 8.9–10.3)
Chloride: 102 mmol/L (ref 98–111)
Creatinine, Ser: 2.46 mg/dL — ABNORMAL HIGH (ref 0.61–1.24)
GFR, Estimated: 28 mL/min — ABNORMAL LOW (ref >60)
Glucose, Bld: 87 mg/dL (ref 70–99)
Potassium: 4 mmol/L (ref 3.5–5.1)
Sodium: 137 mmol/L (ref 135–145)

## 2023-05-10 LAB — HEPATIC FUNCTION PANEL
ALT: 6 U/L (ref 0–44)
AST: 13 U/L — ABNORMAL LOW (ref 15–41)
Albumin: 3.5 g/dL (ref 3.5–5.0)
Alkaline Phosphatase: 53 U/L (ref 38–126)
Bilirubin, Direct: 0.3 mg/dL — ABNORMAL HIGH (ref 0.0–0.2)
Indirect Bilirubin: 0.7 mg/dL (ref 0.3–0.9)
Total Bilirubin: 1 mg/dL (ref 0.0–1.2)
Total Protein: 6.8 g/dL (ref 6.5–8.1)

## 2023-05-10 LAB — CBC
HCT: 27 % — ABNORMAL LOW (ref 39.0–52.0)
Hemoglobin: 8.7 g/dL — ABNORMAL LOW (ref 13.0–17.0)
MCH: 24.9 pg — ABNORMAL LOW (ref 26.0–34.0)
MCHC: 32.2 g/dL (ref 30.0–36.0)
MCV: 77.4 fL — ABNORMAL LOW (ref 80.0–100.0)
Platelets: 95 10*3/uL — ABNORMAL LOW (ref 150–400)
RBC: 3.49 MIL/uL — ABNORMAL LOW (ref 4.22–5.81)
RDW: 18.7 % — ABNORMAL HIGH (ref 11.5–15.5)
WBC: 5.1 10*3/uL (ref 4.0–10.5)
nRBC: 0 % (ref 0.0–0.2)

## 2023-05-10 LAB — AMMONIA: Ammonia: 20 umol/L (ref 9–35)

## 2023-05-10 LAB — GLUCOSE, CAPILLARY
Glucose-Capillary: 86 mg/dL (ref 70–99)
Glucose-Capillary: 86 mg/dL (ref 70–99)
Glucose-Capillary: 81 mg/dL (ref 70–99)
Glucose-Capillary: 103 mg/dL — ABNORMAL HIGH (ref 70–99)

## 2023-05-10 LAB — PHOSPHORUS: Phosphorus: 2.9 mg/dL (ref 2.5–4.6)

## 2023-05-10 LAB — MAGNESIUM: Magnesium: 1.9 mg/dL (ref 1.7–2.4)

## 2023-05-10 NOTE — Evaluation (Signed)
 Physical Therapy Evaluation Patient Details Name: John [name] MRN: [id number] DOB: [birth year] Today's Date: 05/10/2023  History of Present Illness  68 year old male with history of CKD stage IIIa, alcoholic cirrhosis with variceal bleeding and paracentesis many years ago, quit drinking alcohol greater than 10 years ago, ongoing cocaine abuse, noncompliance with medications including diuretics for the last several months presented with worsening abdominal and leg swelling.  On presentation, he was found to have pain new onset A-fib with RVR.  Clinical Impression  Pt admitted with above diagnosis.  Pt currently with functional limitations due to the deficits listed below (see PT Problem List). Pt will benefit from acute skilled PT to increase their independence and safety with mobility to allow discharge. Will follow acutely, but do not feel he needs any post acute therapy.  Did discuss with a plan to increase his mobility at home and he liked the idea of walking the halls of his apartment and slowly increasing number of laps he would do.         If plan is discharge home, recommend the following: Assistance with cooking/housework   Can travel by private vehicle        Equipment Recommendations None recommended by PT  Recommendations for Other Services       Functional Status Assessment Patient has had a recent decline in their functional status and demonstrates the ability to make significant improvements in function in a reasonable and predictable amount of time.     Precautions / Restrictions Precautions Precautions: Fall Restrictions Weight Bearing Restrictions Per Provider Order: No      Mobility  Bed Mobility Overal bed mobility: Modified Independent             General bed mobility comments: Up in chair upon arrival.  Sit > supine with MOD I    Transfers Overall transfer level: Needs assistance Equipment used: Rolling walker (2 wheels) Transfers: Sit  to/from Stand Sit to Stand: Contact guard assist           General transfer comment: cues for hand placement for safety    Ambulation/Gait Ambulation/Gait assistance: Contact guard assist Gait Distance (Feet): 130 Feet Assistive device: Rolling walker (2 wheels) Gait Pattern/deviations: Step-through pattern, Decreased step length - right, Decreased step length - left       General Gait Details: Slow cadence and when cued could easily increase to what he reports is his normal gait speed, but then slowed down.  Seems very stiff overall with gait.  Stairs            Wheelchair Mobility     Tilt Bed    Modified Rankin (Stroke Patients Only)       Balance Overall balance assessment: Mild deficits observed, not formally tested                                           Pertinent Vitals/Pain Pain Assessment Pain Assessment: No/denies pain    Home Living Family/patient expects to be discharged to:: Private residence Living Arrangements: Alone Available Help at Discharge: Friend(s) Type of Home: Apartment Home Access: Elevator       Home Layout: One level Home Equipment: Rollator (4 wheels);Rolling Walker (2 wheels);Cane - single point;Grab bars - tub/shower;Grab bars - toilet;BSC/3in1 Additional Comments: Aide comes in 5 days a week for about 2 hours that assists with housework    Prior Function  Prior Level of Function : Independent/Modified Independent             Mobility Comments: I with rollator       Extremity/Trunk Assessment        Lower Extremity Assessment Lower Extremity Assessment: RLE deficits/detail;LLE deficits/detail RLE Deficits / Details: decreased ankle ROM, with PF more restricted than DF. LLE Deficits / Details: decreased ankle ROM, with PF more restricted than DF.    Cervical / Trunk Assessment Cervical / Trunk Assessment: Normal  Communication   Communication Communication: No apparent difficulties   Cognition Arousal: Alert Behavior During Therapy: WFL for tasks assessed/performed Overall Cognitive Status: Within Functional Limits for tasks assessed                                          General Comments      Exercises     Assessment/Plan    PT Assessment Patient needs continued PT services  PT Problem List Decreased range of motion;Decreased activity tolerance;Decreased balance;Decreased mobility;Decreased safety awareness       PT Treatment Interventions DME instruction;Gait training;Functional mobility training;Therapeutic activities;Therapeutic exercise    PT Goals (Current goals can be found in the Care Plan section)  Acute Rehab PT Goals Patient Stated Goal: Go home PT Goal Formulation: With patient Time For Goal Achievement: 05/24/23 Potential to Achieve Goals: Good    Frequency Min 1X/week     Co-evaluation               AM-PAC PT 6 Clicks Mobility  Outcome Measure Help needed turning from your back to your side while in a flat bed without using bedrails?: None Help needed moving from lying on your back to sitting on the side of a flat bed without using bedrails?: None Help needed moving to and from a bed to a chair (including a wheelchair)?: A Little Help needed standing up from a chair using your arms (e.g., wheelchair or bedside chair)?: A Little Help needed to walk in hospital room?: A Little Help needed climbing 3-5 steps with a railing? : A Lot 6 Click Score: 19    End of Session Equipment Utilized During Treatment: Gait belt Activity Tolerance: Patient tolerated treatment well Patient left: in bed;with call bell/phone within reach;with bed alarm set Nurse Communication: Mobility status PT Visit Diagnosis: Difficulty in walking, not elsewhere classified (R26.2);Muscle weakness (generalized) (M62.81)    Time: 8583-8556 PT Time Calculation (min) (ACUTE ONLY): 27 min   Charges:   PT Evaluation $PT Eval Moderate  Complexity: 1 Mod PT Treatments $Gait Training: 8-22 mins PT General Charges $$ ACUTE PT VISIT: 1 Visit         Darice RAMAN., PT Office [phone] Acute Rehab 05/10/2023   Darice LITTIE Sharps 05/10/2023, 4:16 PM

## 2023-05-10 NOTE — Progress Notes (Signed)
  Echocardiogram 2D Echocardiogram has been performed.  Ocie Doyne RDCS 05/10/2023, 8:26 AM

## 2023-05-10 NOTE — Progress Notes (Addendum)
 Rounding Note    Patient Name: [name] Date of Encounter: 05/10/2023  [facility] Cardiologist: Annabella Scarce, MD   Subjective   John [name] is OK   No CP   Inpatient Medications    Scheduled Meds:  allopurinol   100 mg Oral Daily   carvedilol   3.125 mg Oral BID WC   ferrous sulfate   325 mg Oral Q M,W,F   folic acid   1 mg Oral Daily   furosemide   60 mg Intravenous Q12H   insulin  aspart  0-6 Units Subcutaneous TID WC   lidocaine   1 patch Transdermal Q24H   melatonin  5 mg Oral QHS   pantoprazole   40 mg Oral Daily   spironolactone   50 mg Oral Daily   thiamine   100 mg Oral Daily   Continuous Infusions:  albumin  human 12.5 g (05/10/23 0253)   PRN Meds: magnesium  hydroxide, ondansetron  **OR** ondansetron  (ZOFRAN ) IV   Vital Signs    Vitals:   05/09/23 1939 05/09/23 2132 05/09/23 2236 05/10/23 0429  BP: 93/73 (!) 87/66 100/78 95/76  Pulse: 86  86 86  Resp:      Temp: 98 F (36.7 C)  98.2 F (36.8 C) (!) 97.4 F (36.3 C)  TempSrc: Oral  Oral Oral  SpO2: 98%  99% 97%  Weight:    102.4 kg  Height:        Intake/Output Summary (Last 24 hours) at 05/10/2023 0848 Last data filed at 05/09/2023 2001 Gross per 24 hour  Intake 780 ml  Output 5725 ml  Net -4945 ml      05/10/2023    4:29 AM 05/07/2023    3:39 PM 11/08/2022    5:17 PM  Last 3 Weights  Weight (lbs) 225 lb 12 oz 224 lb 13.9 oz 224 lb 13.9 oz  Weight (kg) 102.4 kg 102 kg 102 kg      Telemetry    Atrial flutter   ? SR today - Personally Reviewed  ECG    No new tracings - Personally Reviewed  Physical Exam   GEN: No acute distress.   Neck: + JVD Cardiac: irregular rhythm, Respiratory: Clear to auscultation anteirorly   GI: Distended abdomen.  NOntender  Nottight  MS: Tr  LE edema, dry Neuro:  Nonfocal  Psych: Normal affect   Labs    High Sensitivity Troponin:  No results for input(s): TROPONINIHS in the last 720 hours.   Chemistry Recent Labs  Lab 05/08/23 0624  05/09/23 0405 05/10/23 0450  NA 135 136 137  K 3.9 3.8 4.0  CL 100 103 102  CO2 23 24 25   GLUCOSE 125* 106* 87  BUN 27* 33* 39*  CREATININE 2.27* 2.16* 2.46*  CALCIUM  8.9 8.9 8.8*  MG 2.3 2.1 1.9  PROT 7.8  --  6.8  ALBUMIN  3.5  --  3.5  AST 16  --  13*  ALT 9  --  6  ALKPHOS 50  --  53  BILITOT 1.6*  --  1.0  GFRNONAA 31* 33* 28*  ANIONGAP 12 9 10     Lipids No results for input(s): CHOL, TRIG, HDL, LABVLDL, LDLCALC, CHOLHDL in the last 168 hours.  Hematology Recent Labs  Lab 05/08/23 0624 05/09/23 0405 05/10/23 0450  WBC 3.7* 6.5 5.1  RBC 3.58* 3.58* 3.49*  HGB 9.1* 8.8* 8.7*  HCT 28.3* 27.8* 27.0*  MCV 79.1* 77.7* 77.4*  MCH 25.4* 24.6* 24.9*  MCHC 32.2 31.7 32.2  RDW 18.8* 18.6* 18.7*  PLT  98* 100* 95*   Thyroid No results for input(s): TSH, FREET4 in the last 168 hours.  BNP Recent Labs  Lab 05/07/23 1622  BNP 219.8*    DDimer No results for input(s): DDIMER in the last 168 hours.   Radiology    US  Paracentesis Result Date: 05/09/2023 INDICATION: Patient with history of chronic kidney disease, alcoholic cirrhosis, esophageal/gastric varices, substance abuse, recurrent ascites. Request received for diagnostic and therapeutic paracentesis. EXAM: ULTRASOUND GUIDED DIAGNOSTIC AND THERAPEUTIC PARACENTESIS MEDICATIONS: 8 ml 1% lidocaine  COMPLICATIONS: None immediate. PROCEDURE: Informed written consent was obtained from the patient after a discussion of the risks, benefits and alternatives to treatment. A timeout was performed prior to the initiation of the procedure. Initial ultrasound scanning demonstrates a large amount of ascites within the right mid to lower abdominal quadrant. The right mid to lower abdomen was prepped and draped in the usual sterile fashion. 1% lidocaine  was used for local anesthesia. Following this, a 19 gauge, 10-cm, Yueh catheter was introduced. An ultrasound image was saved for documentation purposes. The paracentesis was  performed. The catheter was removed and a dressing was applied. The patient tolerated the procedure well without immediate post procedural complication. FINDINGS: A total of approximately 5.2 liters of hazy,amber fluid was removed. Samples were sent to the laboratory as requested by the clinical team. IMPRESSION: Successful ultrasound-guided diagnostic and therapeutic paracentesis yielding 5.2 liters of peritoneal fluid. PLAN: If the patient eventually requires >/=2 paracenteses in a 30 day period, candidacy for formal evaluation by the [facility] Interventional Radiology Portal Hypertension Clinic will be assessed. Performed by: Franky Kelsie RIGGERS Electronically Signed   By: Marcey Moan M.D.   On: 05/09/2023 16:18    Cardiac Studies   Echo 07/2022:  1. Left ventricular ejection fraction, by estimation, is 55 to 60%. The  left ventricle has normal function. The left ventricle has no regional  wall motion abnormalities. There is mild concentric left ventricular  hypertrophy. Left ventricular diastolic  parameters are indeterminate.   2. Right ventricular systolic function is normal. The right ventricular  size is normal. There is mildly elevated pulmonary artery systolic  pressure.   3. Left atrial size was severely dilated.   4. Right atrial size was mildly dilated.   5. The mitral valve is normal in structure. No evidence of mitral valve  regurgitation. No evidence of mitral stenosis.   6. The aortic valve is normal in structure. Aortic valve regurgitation is  not visualized. No aortic stenosis is present.   7. Aortic dilatation noted. There is mild dilatation of the aortic root,  measuring 41 mm. There is mild dilatation of the ascending aorta,  measuring 40 mm.   8. The inferior vena cava is dilated in size with <50% respiratory  variability, suggesting right atrial pressure of 15 mmHg.   Patient Profile     68 y.o. male with a hx of alcoholic cirrhosis, hepatitis C treated with  Epclusa in 2019 , portal gastropathy, esophageal varices, prior GI bleed from gastric varices, GERD, ascites, HFpEF, CKD IIIb, chronic anemia, prior ETOH abuse, HTN, HLD, gout, polysubstance abuse with cocaine and THC, left renal cell carcinoma treated with cryoablation 2022,  who is being seen 05/08/2023 for the evaluation of A fib   Assessment & Plan    Atrial flutter - new diagnosis this admission Will get EKG today  Difficult to see if P waves on tele vs atria lflutter  - currently rate controlled flutter with ?coarse fib on 3.125 mg coreg  BID -  could be a watchman candidate but will still need to tolerate a short course of OAC vs antiplatelet therapy  HFpEF  Hx of medical noncompliance  - echo 07/2022 as above - pt has been out of lasix  for several months - complicated by underlying cirrhosis He has been on lasix  60 iv bid and 50 spiro  Recomm: With bump in Cr I would hold diuretics today  Still with volume increase    ? Paracentesis    Renal   BUN/ cr 39/2.46 today    Hold diuretics today   Reassess in am  Cirrhosis, esophageal and gastric varices, prior GI bleed - would be high bleeding risk with OAC      For questions or updates, please contact [facility] Please consult [email or web address] for contact info under        Signed, Vina Gull, MD  05/10/2023, 8:48 AM

## 2023-05-10 NOTE — Progress Notes (Signed)
 Mobility Specialist - Progress Note   05/10/23 1206  Mobility  Activity Ambulated with assistance in hallway;Ambulated with assistance to bathroom  Level of Assistance Minimal assist, patient does 75% or more  Assistive Device Front wheel walker  Distance Ambulated (ft) 50 ft  Range of Motion/Exercises Active  Activity Response Tolerated fair  Mobility Referral Yes  Mobility visit 1 Mobility  Mobility Specialist Start Time (ACUTE ONLY) 1150  Mobility Specialist Stop Time (ACUTE ONLY) 1206  Mobility Specialist Time Calculation (min) (ACUTE ONLY) 16 min   Pt was found in bed and agreeable to ambulate. Was min-A for bed mobility. Limited distance due to pt stated needing to have BM. Pt returned to use bathroom. At EOS was left on relciner chair with all needs met. Call bell in reach and chair alarm on.  Erminio Leos Mobility Specialist

## 2023-05-10 NOTE — Progress Notes (Signed)
 PROGRESS NOTE    John [name]  FMW:[id number] DOB: Nov 16, 1955 DOA: 05/07/2023 PCP: Patient, No Pcp Per   Brief Narrative:  68 year old male with history of CKD stage IIIa, alcoholic cirrhosis with variceal bleeding and paracentesis many years ago, quit drinking alcohol greater than 10 years ago, ongoing cocaine abuse, noncompliance with medications including diuretics for the last several months presented with worsening abdominal and leg swelling.  On presentation, he was found to have pain new onset A-fib with RVR.  Cardiology was consulted.  Assessment & Plan:   Newly diagnosed atrial flutter -Currently rate controlled.  Continue Coreg .  Cardiology following.  Not a good candidate for anticoagulation because of cirrhosis with history of esophageal and gastric varices  Acute on chronic diastolic heart failure -Most recent echo showed EF of 55 to 60%.  Cardiology following and managing diuretics.  Strict input and output.  Daily weights.  Fluid restriction.  Has diuresed well during this hospitalization.  Continue Coreg  and spironolactone   Decompensated cirrhosis of liver with ascites and history of esophageal and gastric varices with prior history of GI bleed -Status post paracentesis and removal of 5.2 L fluid by IR on 05/09/2023.  Abdomen still looks.  Might need paracentesis again in the next few days. -Continue diuretics.  Outpatient follow-up with GI  AKI on CKD stage IIIa -creatinine of 1.43 on 11/05/2022.  Creatinine worsening to 2.46 this morning.  Monitor  Microcytic anemia/iron deficiency anemia Anemia of chronic disease -Continue supplemental oral iron.  Hemoglobin stable.  Monitor intermittently  Thrombocytopenia -Possibly due to liver cirrhosis.  Platelets stable.  No signs of bleeding.  Monitor  Hypomagnesemia Resolved  Cocaine abuse -counseled by prior hospitalist.  [facility] consult  History of gout -Continue allopurinol   Obesity -Outpatient follow-up  Diabetes  mellitus type 2 -Blood sugar stable.  Continue CBGs with SSI  DVT prophylaxis: SCDs Code Status: Full Family Communication: None at bedside Disposition Plan: Status is: Inpatient Remains inpatient appropriate because: Of severity of illness    Consultants: Cardiology  Procedures: As above  Antimicrobials: None   Subjective: Patient seen and examined at bedside.  Denies worsening shortness of breath, chest pain or abdominal pain.  Still feels swollen.  Objective: Vitals:   05/09/23 1939 05/09/23 2132 05/09/23 2236 05/10/23 0429  BP: 93/73 (!) 87/66 100/78 95/76  Pulse: 86  86 86  Resp:      Temp: 98 F (36.7 C)  98.2 F (36.8 C) (!) 97.4 F (36.3 C)  TempSrc: Oral  Oral Oral  SpO2: 98%  99% 97%  Weight:    102.4 kg  Height:        Intake/Output Summary (Last 24 hours) at 05/10/2023 0733 Last data filed at 05/09/2023 2001 Gross per 24 hour  Intake 780 ml  Output 5725 ml  Net -4945 ml   Filed Weights   05/07/23 1539 05/10/23 0429  Weight: 102 kg 102.4 kg    Examination:  General exam: Appears calm and comfortable.  Currently on room air. Respiratory system: Bilateral decreased breath sounds at bases with scattered crackles Cardiovascular system: S1 & S2 heard, Rate controlled Gastrointestinal system: Abdomen is still distended, soft and nontender. Normal bowel sounds heard. Extremities: No cyanosis, clubbing; bilateral lower extremity edema present Central nervous system: Alert and oriented. No focal neurological deficits. Moving extremities Skin: No rashes, lesions or ulcers Psychiatry: Judgement and insight appear normal. Mood & affect appropriate.     Data Reviewed: I have personally reviewed following labs and imaging studies  CBC: Recent Labs  Lab 05/07/23 1622 05/08/23 0624 05/09/23 0405 05/10/23 0450  WBC 3.4* 3.7* 6.5 5.1  HGB 9.4* 9.1* 8.8* 8.7*  HCT 30.5* 28.3* 27.8* 27.0*  MCV 80.3 79.1* 77.7* 77.4*  PLT 95* 98* 100* 95*   Basic  Metabolic Panel: Recent Labs  Lab 05/07/23 1622 05/08/23 0624 05/09/23 0405 05/10/23 0450  NA 136 135 136 137  K 4.0 3.9 3.8 4.0  CL 104 100 103 102  CO2 22 23 24 25   GLUCOSE 82 125* 106* 87  BUN 23 27* 33* 39*  CREATININE 1.60* 2.27* 2.16* 2.46*  CALCIUM  9.2 8.9 8.9 8.8*  MG  --  2.3 2.1 1.9  PHOS  --   --  2.8 2.9   GFR: Estimated Creatinine Clearance: 34.3 mL/min (A) (by C-G formula based on SCr of 2.46 mg/dL (H)). Liver Function Tests: Recent Labs  Lab 05/08/23 0624 05/10/23 0450  AST 16 13*  ALT 9 6  ALKPHOS 50 53  BILITOT 1.6* 1.0  PROT 7.8 6.8  ALBUMIN  3.5 3.5   No results for input(s): LIPASE, AMYLASE in the last 168 hours. Recent Labs  Lab 05/10/23 0450  AMMONIA 20   Coagulation Profile: Recent Labs  Lab 05/08/23 1229  INR 1.6*   Cardiac Enzymes: No results for input(s): CKTOTAL, CKMB, CKMBINDEX, TROPONINI in the last 168 hours. BNP (last 3 results) Recent Labs    11/05/22 1358 11/28/22 0849  PROBNP 3,248* 4,858*   HbA1C: Recent Labs    05/08/23 0624  HGBA1C 5.3   CBG: Recent Labs  Lab 05/08/23 2217 05/09/23 0901 05/09/23 1147 05/09/23 1650 05/09/23 1941  GLUCAP 109* 97 86 91 95   Lipid Profile: No results for input(s): CHOL, HDL, LDLCALC, TRIG, CHOLHDL, LDLDIRECT in the last 72 hours. Thyroid Function Tests: No results for input(s): TSH, T4TOTAL, FREET4, T3FREE, THYROIDAB in the last 72 hours. Anemia Panel: Recent Labs    05/09/23 0402  VITAMINB12 352  FOLATE 10.1  TIBC 356  IRON 28*   Sepsis Labs: Recent Labs  Lab 05/08/23 1229  LATICACIDVEN 2.0*    Recent Results (from the past 240 hours)  SARS Coronavirus 2 by RT PCR (hospital order, performed in [facility] hospital lab) *cepheid single result test* Anterior Nasal Swab     Status: None   Collection Time: 05/08/23 12:01 AM   Specimen: Anterior Nasal Swab  Result Value Ref Range Status   SARS Coronavirus 2 by RT PCR NEGATIVE  NEGATIVE Final    Comment: (NOTE) SARS-CoV-2 target nucleic acids are NOT DETECTED.  The SARS-CoV-2 RNA is generally detectable in upper and lower respiratory specimens during the acute phase of infection. The lowest concentration of SARS-CoV-2 viral copies this assay can detect is 250 copies / mL. A negative result does not preclude SARS-CoV-2 infection and should not be used as the sole basis for treatment or other patient management decisions.  A negative result may occur with improper specimen collection / handling, submission of specimen other than nasopharyngeal swab, presence of viral mutation(s) within the areas targeted by this assay, and inadequate number of viral copies (<250 copies / mL). A negative result must be combined with clinical observations, patient history, and epidemiological information.  Fact Sheet for Patients:   roadlaptop.co.za  Fact Sheet for Healthcare Providers: http://kim-miller.com/  This test is not yet approved or  cleared by the [place]  FDA and has been authorized for detection and/or diagnosis of SARS-CoV-2 by FDA under an Emergency Use Authorization (EUA).  This EUA will remain in effect (meaning this test can be used) for the duration of the COVID-19 declaration under Section 564(b)(1) of the Act, 21 U.S.C. section 360bbb-3(b)(1), unless the authorization is terminated or revoked sooner.  Performed at [facility], [address], [place], [place] [address]   Body fluid culture w Gram Stain     Status: None (Preliminary result)   Collection Time: 05/09/23  1:38 PM   Specimen: PATH Cytology Peritoneal fluid  Result Value Ref Range Status   Specimen Description   Final    PERITONEAL Performed at [facility], [address], [place], [place] [address]    Special Requests   Final    NONE Performed at [facility], [address] [place], [place] [address]    Gram Stain   Final    NO WBC SEEN NO ORGANISMS SEEN Performed at [facility], [address] [place], [place] [address]    Culture PENDING  Incomplete   Report Status PENDING  Incomplete         Radiology Studies: US  Paracentesis Result Date: 05/09/2023 INDICATION: Patient with history of chronic kidney disease, alcoholic cirrhosis, esophageal/gastric varices, substance abuse, recurrent ascites. Request received for diagnostic and therapeutic paracentesis. EXAM: ULTRASOUND GUIDED DIAGNOSTIC AND THERAPEUTIC PARACENTESIS MEDICATIONS: 8 ml 1% lidocaine  COMPLICATIONS: None immediate. PROCEDURE: Informed written consent was obtained from the patient after a discussion of the risks, benefits and alternatives to treatment. A timeout was performed prior to the initiation of the procedure. Initial ultrasound scanning demonstrates a large amount of ascites within the right mid to lower abdominal quadrant. The right mid to lower abdomen was prepped and draped in the usual sterile fashion. 1% lidocaine  was used for local anesthesia. Following this, a 19 gauge, 10-cm, Yueh catheter was introduced. An ultrasound image was saved for documentation purposes. The paracentesis was performed. The catheter was removed and a dressing was applied. The patient tolerated the procedure well without immediate post procedural complication. FINDINGS: A total of approximately 5.2 liters of hazy,amber fluid was removed. Samples were sent to the laboratory as requested by the clinical team. IMPRESSION: Successful ultrasound-guided diagnostic and therapeutic paracentesis yielding 5.2 liters of peritoneal fluid. PLAN: If the patient eventually requires >/=2 paracenteses in a 30 day period, candidacy for formal evaluation by the [facility] Interventional Radiology Portal Hypertension Clinic will be assessed. Performed by: Franky Kelsie RIGGERS Electronically Signed   By: Marcey Moan M.D.   On:  05/09/2023 16:18        Scheduled Meds:  allopurinol   100 mg Oral Daily   carvedilol   3.125 mg Oral BID WC   ferrous sulfate   325 mg Oral Q M,W,F   folic acid   1 mg Oral Daily   furosemide   60 mg Intravenous Q12H   insulin  aspart  0-6 Units Subcutaneous TID WC   lidocaine   1 patch Transdermal Q24H   melatonin  5 mg Oral QHS   pantoprazole   40 mg Oral Daily   spironolactone   50 mg Oral Daily   thiamine   100 mg Oral Daily   Continuous Infusions:  albumin  human 12.5 g (05/10/23 0253)          Sophie Mao, MD [facility] 05/10/2023, 7:33 AM

## 2023-05-10 NOTE — Plan of Care (Signed)
  Problem: Education: Goal: Ability to describe self-care measures that may prevent or decrease complications (Diabetes Survival Skills Education) will improve 05/10/2023 0121 by Lavina Hermina CROME, RN Outcome: Progressing 05/09/2023 2218 by Lavina Hermina CROME, RN Outcome: Progressing Goal: Individualized Educational Video(s) 05/10/2023 0121 by Lavina Hermina CROME, RN Outcome: Progressing 05/09/2023 2218 by Lavina Hermina CROME, RN Outcome: Progressing   Problem: Coping: Goal: Ability to adjust to condition or change in health will improve 05/10/2023 0121 by Lavina Hermina CROME, RN Outcome: Progressing 05/09/2023 2218 by Lavina Hermina CROME, RN Outcome: Progressing   Problem: Fluid Volume: Goal: Ability to maintain a balanced intake and output will improve 05/10/2023 0121 by Lavina Hermina CROME, RN Outcome: Progressing 05/09/2023 2218 by Lavina Hermina CROME, RN Outcome: Progressing   Problem: Health Behavior/Discharge Planning: Goal: Ability to identify and utilize available resources and services will improve 05/10/2023 0121 by Lavina Hermina CROME, RN Outcome: Progressing 05/09/2023 2218 by Lavina Hermina CROME, RN Outcome: Progressing Goal: Ability to manage health-related needs will improve 05/10/2023 0121 by Lavina Hermina CROME, RN Outcome: Progressing 05/09/2023 2218 by Lavina Hermina CROME, RN Outcome: Progressing

## 2023-05-10 NOTE — Plan of Care (Signed)

## 2023-05-11 DIAGNOSIS — I4891 Unspecified atrial fibrillation: Secondary | ICD-10-CM | POA: Diagnosis not present

## 2023-05-11 LAB — GLUCOSE, CAPILLARY
Glucose-Capillary: 95 mg/dL (ref 70–99)
Glucose-Capillary: 103 mg/dL — ABNORMAL HIGH (ref 70–99)
Glucose-Capillary: 94 mg/dL (ref 70–99)
Glucose-Capillary: 98 mg/dL (ref 70–99)

## 2023-05-11 LAB — BASIC METABOLIC PANEL
Anion gap: 8 (ref 5–15)
Anion gap: 8 (ref 5–15)
BUN: 40 mg/dL — ABNORMAL HIGH (ref 8–23)
BUN: 37 mg/dL — ABNORMAL HIGH (ref 8–23)
CO2: 26 mmol/L (ref 22–32)
CO2: 27 mmol/L (ref 22–32)
Calcium: 8.7 mg/dL — ABNORMAL LOW (ref 8.9–10.3)
Calcium: 8.9 mg/dL (ref 8.9–10.3)
Chloride: 100 mmol/L (ref 98–111)
Chloride: 99 mmol/L (ref 98–111)
Creatinine, Ser: 2.08 mg/dL — ABNORMAL HIGH (ref 0.61–1.24)
Creatinine, Ser: 2.12 mg/dL — ABNORMAL HIGH (ref 0.61–1.24)
GFR, Estimated: 34 mL/min — ABNORMAL LOW (ref >60)
GFR, Estimated: 34 mL/min — ABNORMAL LOW (ref >60)
Glucose, Bld: 96 mg/dL (ref 70–99)
Glucose, Bld: 87 mg/dL (ref 70–99)
Potassium: 4.2 mmol/L (ref 3.5–5.1)
Potassium: 4.6 mmol/L (ref 3.5–5.1)
Sodium: 134 mmol/L — ABNORMAL LOW (ref 135–145)
Sodium: 134 mmol/L — ABNORMAL LOW (ref 135–145)

## 2023-05-11 LAB — CBC
HCT: 29 % — ABNORMAL LOW (ref 39.0–52.0)
Hemoglobin: 9.1 g/dL — ABNORMAL LOW (ref 13.0–17.0)
MCH: 24.6 pg — ABNORMAL LOW (ref 26.0–34.0)
MCHC: 31.4 g/dL (ref 30.0–36.0)
MCV: 78.4 fL — ABNORMAL LOW (ref 80.0–100.0)
Platelets: 98 10*3/uL — ABNORMAL LOW (ref 150–400)
RBC: 3.7 MIL/uL — ABNORMAL LOW (ref 4.22–5.81)
RDW: 18.5 % — ABNORMAL HIGH (ref 11.5–15.5)
WBC: 4.8 10*3/uL (ref 4.0–10.5)
nRBC: 0 % (ref 0.0–0.2)

## 2023-05-11 LAB — HEPATIC FUNCTION PANEL
ALT: 6 U/L (ref 0–44)
AST: 12 U/L — ABNORMAL LOW (ref 15–41)
Albumin: 3.4 g/dL — ABNORMAL LOW (ref 3.5–5.0)
Alkaline Phosphatase: 44 U/L (ref 38–126)
Bilirubin, Direct: 0.2 mg/dL (ref 0.0–0.2)
Indirect Bilirubin: 0.8 mg/dL (ref 0.3–0.9)
Total Bilirubin: 1 mg/dL (ref 0.0–1.2)
Total Protein: 6.5 g/dL (ref 6.5–8.1)

## 2023-05-11 LAB — PHOSPHORUS: Phosphorus: 2.6 mg/dL (ref 2.5–4.6)

## 2023-05-11 LAB — MAGNESIUM: Magnesium: 1.9 mg/dL (ref 1.7–2.4)

## 2023-05-11 MED ORDER — OXYCODONE HCL 5 MG PO TABS
5.0000 mg | ORAL_TABLET | Freq: Four times a day (QID) | ORAL | Status: DC | PRN
  Administered 2023-05-11 – 2023-05-13 (×4): 5 mg via ORAL
  Filled 2023-05-11 (×4): qty 1

## 2023-05-11 NOTE — Progress Notes (Signed)
 PROGRESS NOTE    John [name]  FMW:[id number] DOB: 20-Sep-1955 DOA: 05/07/2023 PCP: Patient, No Pcp Per   Brief Narrative:  68 year old male with history of CKD stage IIIa, alcoholic cirrhosis with variceal bleeding and paracentesis many years ago, quit drinking alcohol greater than 10 years ago, ongoing cocaine abuse, noncompliance with medications including diuretics for the last several months presented with worsening abdominal and leg swelling.  On presentation, he was found to have pain new onset A-fib with RVR.  Cardiology was consulted.  Assessment & Plan:   Newly diagnosed atrial flutter -Currently rate controlled.  Continue Coreg .  Cardiology following.  Not a good candidate for anticoagulation because of cirrhosis with history of esophageal and gastric varices  Acute on chronic diastolic heart failure -Most recent echo showed EF of 55 to 60%.  Cardiology following and managing diuretics.  Diuretics held on on 05/10/23 by cardiology due to increased creatinine.  Strict input and output.  Daily weights.  Fluid restriction.  Has diuresed well during this hospitalization.  Continue Coreg   Decompensated cirrhosis of liver with ascites and history of esophageal and gastric varices with prior history of GI bleed -Status post paracentesis and removal of 5.2 L fluid by IR on 05/09/2023.  Abdomen still looks.  Might need paracentesis again in the next few days. -Diuretics as per cardiology.  Currently on hold.  Outpatient follow-up with GI  AKI on CKD stage IIIa -creatinine of 1.43 on 11/05/2022.  Creatinine slightly improving to 2.08 this morning.  Monitor  Microcytic anemia/iron deficiency anemia Anemia of chronic disease -Continue supplemental oral iron.  Hemoglobin stable.  Monitor intermittently  Thrombocytopenia -Possibly due to liver cirrhosis.  Platelets stable.  No signs of bleeding.  Monitor  Hypomagnesemia -Resolved  Cocaine abuse -counseled by prior hospitalist.  [facility]  consult  History of gout -Continue allopurinol   Obesity -Outpatient follow-up  Diabetes mellitus type 2 -Blood sugar stable.  Continue CBGs with SSI  DVT prophylaxis: SCDs Code Status: Full Family Communication: None at bedside Disposition Plan: Status is: Inpatient Remains inpatient appropriate because: Of severity of illness    Consultants: Cardiology  Procedures: As above  Antimicrobials: None   Subjective: Patient seen and examined at bedside.  Still complains of increasing abdominal swelling.  Denies worsening shortness of breath or chest pain.  No fever reported. Objective: Vitals:   05/10/23 0429 05/10/23 1229 05/10/23 2046 05/11/23 0530  BP: 95/76 95/72 94/67  107/76  Pulse: 86 83 85 73  Resp:      Temp: (!) 97.4 F (36.3 C) 97.6 F (36.4 C)    TempSrc: Oral Oral    SpO2: 97% 97% 99% 98%  Weight: 102.4 kg   101.7 kg  Height:        Intake/Output Summary (Last 24 hours) at 05/11/2023 0725 Last data filed at 05/11/2023 0310 Gross per 24 hour  Intake 800 ml  Output 850 ml  Net -50 ml   Filed Weights   05/07/23 1539 05/10/23 0429 05/11/23 0530  Weight: 102 kg 102.4 kg 101.7 kg    Examination:  General: On room air.  No distress.  Chronically ill and deconditioned looking. ENT/neck: No thyromegaly.  JVD is not elevated  respiratory: Decreased breath sounds at bases bilaterally with some crackles; no wheezing  CVS: S1-S2 heard, rate controlled Abdominal: Soft, nontender, looks more distended today; no organomegaly, bowel sounds are heard Extremities: Trace lower extremity edema; no cyanosis  CNS: Awake and alert.  No focal neurologic deficit.  Moves extremities Lymph:  No obvious lymphadenopathy Skin: No obvious ecchymosis/lesions  psych: Flat affect.  Not agitated currently  musculoskeletal: No obvious joint swelling/deformity     Data Reviewed: I have personally reviewed following labs and imaging studies  CBC: Recent Labs  Lab  05/07/23 1622 05/08/23 0624 05/09/23 0405 05/10/23 0450 05/11/23 0430  WBC 3.4* 3.7* 6.5 5.1 4.8  HGB 9.4* 9.1* 8.8* 8.7* 9.1*  HCT 30.5* 28.3* 27.8* 27.0* 29.0*  MCV 80.3 79.1* 77.7* 77.4* 78.4*  PLT 95* 98* 100* 95* 98*   Basic Metabolic Panel: Recent Labs  Lab 05/07/23 1622 05/08/23 0624 05/09/23 0405 05/10/23 0450 05/11/23 0430  NA 136 135 136 137 134*  K 4.0 3.9 3.8 4.0 4.2  CL 104 100 103 102 100  CO2 22 23 24 25 26   GLUCOSE 82 125* 106* 87 96  BUN 23 27* 33* 39* 40*  CREATININE 1.60* 2.27* 2.16* 2.46* 2.08*  CALCIUM  9.2 8.9 8.9 8.8* 8.7*  MG  --  2.3 2.1 1.9 1.9  PHOS  --   --  2.8 2.9 2.6   GFR: Estimated Creatinine Clearance: 40.4 mL/min (A) (by C-G formula based on SCr of 2.08 mg/dL (H)). Liver Function Tests: Recent Labs  Lab 05/08/23 0624 05/10/23 0450 05/11/23 0430  AST 16 13* 12*  ALT 9 6 6   ALKPHOS 50 53 44  BILITOT 1.6* 1.0 1.0  PROT 7.8 6.8 6.5  ALBUMIN  3.5 3.5 3.4*   No results for input(s): LIPASE, AMYLASE in the last 168 hours. Recent Labs  Lab 05/10/23 0450  AMMONIA 20   Coagulation Profile: Recent Labs  Lab 05/08/23 1229  INR 1.6*   Cardiac Enzymes: No results for input(s): CKTOTAL, CKMB, CKMBINDEX, TROPONINI in the last 168 hours. BNP (last 3 results) Recent Labs    11/05/22 1358 11/28/22 0849  PROBNP 3,248* 4,858*   HbA1C: No results for input(s): HGBA1C in the last 72 hours.  CBG: Recent Labs  Lab 05/09/23 1941 05/10/23 0743 05/10/23 1227 05/10/23 1637 05/10/23 2048  GLUCAP 95 86 86 81 103*   Lipid Profile: No results for input(s): CHOL, HDL, LDLCALC, TRIG, CHOLHDL, LDLDIRECT in the last 72 hours. Thyroid Function Tests: No results for input(s): TSH, T4TOTAL, FREET4, T3FREE, THYROIDAB in the last 72 hours. Anemia Panel: Recent Labs    05/09/23 0402  VITAMINB12 352  FOLATE 10.1  TIBC 356  IRON 28*   Sepsis Labs: Recent Labs  Lab 05/08/23 1229  LATICACIDVEN 2.0*     Recent Results (from the past 240 hours)  SARS Coronavirus 2 by RT PCR (hospital order, performed in [facility] hospital lab) *cepheid single result test* Anterior Nasal Swab     Status: None   Collection Time: 05/08/23 12:01 AM   Specimen: Anterior Nasal Swab  Result Value Ref Range Status   SARS Coronavirus 2 by RT PCR NEGATIVE NEGATIVE Final    Comment: (NOTE) SARS-CoV-2 target nucleic acids are NOT DETECTED.  The SARS-CoV-2 RNA is generally detectable in upper and lower respiratory specimens during the acute phase of infection. The lowest concentration of SARS-CoV-2 viral copies this assay can detect is 250 copies / mL. A negative result does not preclude SARS-CoV-2 infection and should not be used as the sole basis for treatment or other patient management decisions.  A negative result may occur with improper specimen collection / handling, submission of specimen other than nasopharyngeal swab, presence of viral mutation(s) within the areas targeted by this assay, and inadequate number of viral copies (<250 copies / mL).  A negative result must be combined with clinical observations, patient history, and epidemiological information.  Fact Sheet for Patients:   roadlaptop.co.za  Fact Sheet for Healthcare Providers: http://kim-miller.com/  This test is not yet approved or  cleared by the [place]  FDA and has been authorized for detection and/or diagnosis of SARS-CoV-2 by FDA under an Emergency Use Authorization (EUA).  This EUA will remain in effect (meaning this test can be used) for the duration of the COVID-19 declaration under Section 564(b)(1) of the Act, 21 U.S.C. section 360bbb-3(b)(1), unless the authorization is terminated or revoked sooner.  Performed at [facility], [address], [place], [place] [address]   Body fluid culture w Gram Stain     Status: None (Preliminary result)    Collection Time: 05/09/23  1:38 PM   Specimen: PATH Cytology Peritoneal fluid  Result Value Ref Range Status   Specimen Description   Final    PERITONEAL Performed at [facility], [address] [place], [place] [address]    Special Requests   Final    NONE Performed at [facility], [address] [place], [place] [address]    Gram Stain NO WBC SEEN NO ORGANISMS SEEN   Final   Culture   Final    NO GROWTH < 24 HOURS Performed at [facility], [address], [place], [place] [address]    Report Status PENDING  Incomplete         Radiology Studies: ECHOCARDIOGRAM COMPLETE Result Date: 05/10/2023    ECHOCARDIOGRAM REPORT   Patient Name:   John [name] Date of Exam: 05/10/2023 Medical Rec #:  [id number]           Height:       68.0 in Accession #:    [id number]          Weight:       225.7 lb Date of Birth:  February 22, 1956            BSA:          2.152 m Patient Age:    67 years            BP:           95/76 mmHg Patient Gender: M                   HR:           80 bpm. Exam Location:  Inpatient Procedure: 2D Echo, Cardiac Doppler and Color Doppler Indications:    Atrial Fibrillation  History:        Patient has prior history of Echocardiogram examinations, most                 recent 08/02/2022. CAD, Stroke and COPD, Signs/Symptoms:Shortness                 of Breath; Risk Factors:Hypertension.  Sonographer:    Juanita Shaw Referring Phys: [id number] TIFFANY  IMPRESSIONS  1. Left ventricular ejection fraction, by estimation, is 55 to 60%. The left ventricle has normal function. The left ventricle has no regional wall motion abnormalities. There is mild left ventricular hypertrophy. Left ventricular diastolic parameters are indeterminate.  2. Right ventricular systolic function is low normal. The right ventricular size is normal. There is normal pulmonary artery systolic pressure.  3. Left atrial size was moderately dilated.  4. The mitral  valve is normal in structure. Mild mitral valve regurgitation.  5.  The aortic valve is normal in structure. Aortic valve regurgitation is not visualized.  6. Aortic dilatation noted. There is moderate dilatation of the aortic root, measuring 44 mm. There is mild dilatation of the ascending aorta, measuring 42 mm. FINDINGS  Left Ventricle: Left ventricular ejection fraction, by estimation, is 55 to 60%. The left ventricle has normal function. The left ventricle has no regional wall motion abnormalities. The left ventricular internal cavity size was normal in size. There is  mild left ventricular hypertrophy. Left ventricular diastolic parameters are indeterminate. Right Ventricle: The right ventricular size is normal. Right vetricular wall thickness was not assessed. Right ventricular systolic function is low normal. There is normal pulmonary artery systolic pressure. The tricuspid regurgitant velocity is 2.29 m/s, and with an assumed right atrial pressure of 3 mmHg, the estimated right ventricular systolic pressure is 24.0 mmHg. Left Atrium: Left atrial size was moderately dilated. Right Atrium: Right atrial size was normal in size. Pericardium: There is no evidence of pericardial effusion. Mitral Valve: The mitral valve is normal in structure. Mild mitral valve regurgitation. MV peak gradient, 4.4 mmHg. The mean mitral valve gradient is 2.0 mmHg. Tricuspid Valve: The tricuspid valve is normal in structure. Tricuspid valve regurgitation is mild. Aortic Valve: The aortic valve is normal in structure. Aortic valve regurgitation is not visualized. Aortic valve mean gradient measures 1.0 mmHg. Aortic valve peak gradient measures 1.9 mmHg. Aortic valve area, by VTI measures 3.37 cm. Pulmonic Valve: The pulmonic valve was not well visualized. Pulmonic valve regurgitation is not visualized. Aorta: Aortic dilatation noted. There is moderate dilatation of the aortic root, measuring 44 mm. There is mild dilatation of the  ascending aorta, measuring 42 mm. IAS/Shunts: No atrial level shunt detected by color flow Doppler.  LEFT VENTRICLE PLAX 2D LVIDd:         4.70 cm      Diastology LVIDs:         3.30 cm      LV e' medial:    11.50 cm/s LV PW:         1.00 cm      LV E/e' medial:  8.0 LV IVS:        1.30 cm      LV e' lateral:   13.50 cm/s LVOT diam:     2.40 cm      LV E/e' lateral: 6.8 LV SV:         39 LV SV Index:   18 LVOT Area:     4.52 cm  LV Volumes (MOD) LV vol d, MOD A2C: 167.0 ml LV vol d, MOD A4C: 103.0 ml LV vol s, MOD A2C: 66.2 ml LV vol s, MOD A4C: 50.8 ml LV SV MOD A2C:     100.8 ml LV SV MOD A4C:     103.0 ml LV SV MOD BP:      69.7 ml RIGHT VENTRICLE RV Basal diam:  3.50 cm RV Mid diam:    2.20 cm RV S prime:     6.64 cm/s LEFT ATRIUM             Index        RIGHT ATRIUM           Index LA diam:        5.10 cm 2.37 cm/m   RA Area:     12.80 cm LA Vol (A2C):   96.8 ml 44.99 ml/m  RA Volume:   25.90 ml  12.04 ml/m LA  Vol (A4C):   95.1 ml 44.20 ml/m LA Biplane Vol: 99.3 ml 46.15 ml/m  AORTIC VALVE                    PULMONIC VALVE AV Area (Vmax):    3.61 cm     PV Vmax:       0.57 m/s AV Area (Vmean):   2.99 cm     PV Peak grad:  1.3 mmHg AV Area (VTI):     3.37 cm AV Vmax:           69.60 cm/s AV Vmean:          46.500 cm/s AV VTI:            0.116 m AV Peak Grad:      1.9 mmHg AV Mean Grad:      1.0 mmHg LVOT Vmax:         55.50 cm/s LVOT Vmean:        30.700 cm/s LVOT VTI:          0.086 m LVOT/AV VTI ratio: 0.74  AORTA Ao Root diam: 4.40 cm Ao Asc diam:  4.20 cm MITRAL VALVE               TRICUSPID VALVE MV Area (PHT): 6.17 cm    TR Peak grad:   21.0 mmHg MV Area VTI:   3.03 cm    TR Vmax:        229.00 cm/s MV Peak grad:  4.4 mmHg MV Mean grad:  2.0 mmHg    SHUNTS MV Vmax:       1.05 m/s    Systemic VTI:  0.09 m MV Vmean:      54.4 cm/s   Systemic Diam: 2.40 cm MV Decel Time: 123 msec MV E velocity: 92.40 cm/s Vina Gull MD Electronically signed by Vina Gull MD Signature Date/Time: 05/10/2023/3:17:53  PM    Final    US  Paracentesis Result Date: 05/09/2023 INDICATION: Patient with history of chronic kidney disease, alcoholic cirrhosis, esophageal/gastric varices, substance abuse, recurrent ascites. Request received for diagnostic and therapeutic paracentesis. EXAM: ULTRASOUND GUIDED DIAGNOSTIC AND THERAPEUTIC PARACENTESIS MEDICATIONS: 8 ml 1% lidocaine  COMPLICATIONS: None immediate. PROCEDURE: Informed written consent was obtained from the patient after a discussion of the risks, benefits and alternatives to treatment. A timeout was performed prior to the initiation of the procedure. Initial ultrasound scanning demonstrates a large amount of ascites within the right mid to lower abdominal quadrant. The right mid to lower abdomen was prepped and draped in the usual sterile fashion. 1% lidocaine  was used for local anesthesia. Following this, a 19 gauge, 10-cm, Yueh catheter was introduced. An ultrasound image was saved for documentation purposes. The paracentesis was performed. The catheter was removed and a dressing was applied. The patient tolerated the procedure well without immediate post procedural complication. FINDINGS: A total of approximately 5.2 liters of hazy,amber fluid was removed. Samples were sent to the laboratory as requested by the clinical team. IMPRESSION: Successful ultrasound-guided diagnostic and therapeutic paracentesis yielding 5.2 liters of peritoneal fluid. PLAN: If the patient eventually requires >/=2 paracenteses in a 30 day period, candidacy for formal evaluation by the [facility] will be assessed. Performed by: Franky Kelsie RIGGERS Electronically Signed   By: Marcey Moan M.D.   On: 05/09/2023 16:18        Scheduled Meds:  allopurinol   100 mg Oral Daily   carvedilol   3.125 mg Oral BID WC  ferrous sulfate   325 mg Oral Q M,W,F   folic acid   1 mg Oral Daily   insulin  aspart  0-6 Units Subcutaneous TID WC   lidocaine   1  patch Transdermal Q24H   melatonin  5 mg Oral QHS   pantoprazole   40 mg Oral Daily   thiamine   100 mg Oral Daily   Continuous Infusions:          Sophie Mao, MD [facility] 05/11/2023, 7:25 AM

## 2023-05-11 NOTE — Progress Notes (Signed)
 Mobility Specialist - Progress Note   05/11/23 1158  Mobility  Activity Transferred from bed to chair  Level of Assistance Minimal assist, patient does 75% or more  Assistive Device Front wheel walker  Range of Motion/Exercises Active  Activity Response Tolerated well  Mobility Referral Yes  Mobility visit 1 Mobility  Mobility Specialist Start Time (ACUTE ONLY) 1145  Mobility Specialist Stop Time (ACUTE ONLY) 1158  Mobility Specialist Time Calculation (min) (ACUTE ONLY) 13 min   Pt was found in bed and agreeable to mobilize. Deferred ambulation due to pt c/o back pain. Min-A for bed mobility and elevated bed to assist going from STS. At EOS was left on recliner chair with all needs met. Call bell in reach and RN notified.  Erminio Leos Mobility Specialist

## 2023-05-11 NOTE — Progress Notes (Signed)
 Rounding Note    Patient Name: John [name] Date of Encounter: 05/11/2023  [facility] Cardiologist: Annabella Scarce, MD   Subjective   Pt denies CP  Breathing is OK   Inpatient Medications    Scheduled Meds:  allopurinol   100 mg Oral Daily   carvedilol   3.125 mg Oral BID WC   ferrous sulfate   325 mg Oral Q M,W,F   folic acid   1 mg Oral Daily   insulin  aspart  0-6 Units Subcutaneous TID WC   lidocaine   1 patch Transdermal Q24H   melatonin  5 mg Oral QHS   pantoprazole   40 mg Oral Daily   thiamine   100 mg Oral Daily   Continuous Infusions:   PRN Meds: magnesium  hydroxide, ondansetron  **OR** ondansetron  (ZOFRAN ) IV   Vital Signs    Vitals:   05/10/23 0429 05/10/23 1229 05/10/23 2046 05/11/23 0530  BP: 95/76 95/72 94/67  107/76  Pulse: 86 83 85 73  Resp:      Temp: (!) 97.4 F (36.3 C) 97.6 F (36.4 C)    TempSrc: Oral Oral    SpO2: 97% 97% 99% 98%  Weight: 102.4 kg   101.7 kg  Height:        Intake/Output Summary (Last 24 hours) at 05/11/2023 0653 Last data filed at 05/11/2023 0310 Gross per 24 hour  Intake 800 ml  Output 850 ml  Net -50 ml      05/11/2023    5:30 AM 05/10/2023    4:29 AM 05/07/2023    3:39 PM  Last 3 Weights  Weight (lbs) 224 lb 3.3 oz 225 lb 12 oz 224 lb 13.9 oz  Weight (kg) 101.7 kg 102.4 kg 102 kg      Telemetry   - Personally Reviewed  ECG    EKG yesterday Atrial flutter with variable conduction  - Personally Reviewed  Physical Exam   GEN: No acute distress.   Neck:  JVP normal Cardiac: irregularly irreg  No murmurs  Respiratory: Clear to auscultation anteirorly   GI: Distended abdomen.  Nontender   MS: Tr  LE edema,  Labs    High Sensitivity Troponin:  No results for input(s): TROPONINIHS in the last 720 hours.   Chemistry Recent Labs  Lab 05/08/23 0624 05/09/23 0405 05/10/23 0450 05/11/23 0430  NA 135 136 137 134*  K 3.9 3.8 4.0 4.2  CL 100 103 102 100  CO2 23 24 25 26   GLUCOSE 125*  106* 87 96  BUN 27* 33* 39* 40*  CREATININE 2.27* 2.16* 2.46* 2.08*  CALCIUM  8.9 8.9 8.8* 8.7*  MG 2.3 2.1 1.9 1.9  PROT 7.8  --  6.8 6.5  ALBUMIN  3.5  --  3.5 3.4*  AST 16  --  13* 12*  ALT 9  --  6 6  ALKPHOS 50  --  53 44  BILITOT 1.6*  --  1.0 1.0  GFRNONAA 31* 33* 28* 34*  ANIONGAP 12 9 10 8     Lipids No results for input(s): CHOL, TRIG, HDL, LABVLDL, LDLCALC, CHOLHDL in the last 168 hours.  Hematology Recent Labs  Lab 05/09/23 0405 05/10/23 0450 05/11/23 0430  WBC 6.5 5.1 4.8  RBC 3.58* 3.49* 3.70*  HGB 8.8* 8.7* 9.1*  HCT 27.8* 27.0* 29.0*  MCV 77.7* 77.4* 78.4*  MCH 24.6* 24.9* 24.6*  MCHC 31.7 32.2 31.4  RDW 18.6* 18.7* 18.5*  PLT 100* 95* 98*   Thyroid No results for input(s): TSH, FREET4 in the last  168 hours.  BNP Recent Labs  Lab 05/07/23 1622  BNP 219.8*    DDimer No results for input(s): DDIMER in the last 168 hours.   Radiology    ECHOCARDIOGRAM COMPLETE Result Date: 05/10/2023    ECHOCARDIOGRAM REPORT   Patient Name:   MIHRAN LEBARRON Date of Exam: 05/10/2023 Medical Rec #:  [id number]           Height:       68.0 in Accession #:    [id number]          Weight:       225.7 lb Date of Birth:  Sep 14, 1955            BSA:          2.152 m Patient Age:    67 years            BP:           95/76 mmHg Patient Gender: M                   HR:           80 bpm. Exam Location:  Inpatient Procedure: 2D Echo, Cardiac Doppler and Color Doppler Indications:    Atrial Fibrillation  History:        Patient has prior history of Echocardiogram examinations, most                 recent 08/02/2022. CAD, Stroke and COPD, Signs/Symptoms:Shortness                 of Breath; Risk Factors:Hypertension.  Sonographer:    Juanita Shaw Referring Phys: [id number] TIFFANY [place] IMPRESSIONS  1. Left ventricular ejection fraction, by estimation, is 55 to 60%. The left ventricle has normal function. The left ventricle has no regional wall motion abnormalities. There is mild  left ventricular hypertrophy. Left ventricular diastolic parameters are indeterminate.  2. Right ventricular systolic function is low normal. The right ventricular size is normal. There is normal pulmonary artery systolic pressure.  3. Left atrial size was moderately dilated.  4. The mitral valve is normal in structure. Mild mitral valve regurgitation.  5. The aortic valve is normal in structure. Aortic valve regurgitation is not visualized.  6. Aortic dilatation noted. There is moderate dilatation of the aortic root, measuring 44 mm. There is mild dilatation of the ascending aorta, measuring 42 mm. FINDINGS  Left Ventricle: Left ventricular ejection fraction, by estimation, is 55 to 60%. The left ventricle has normal function. The left ventricle has no regional wall motion abnormalities. The left ventricular internal cavity size was normal in size. There is  mild left ventricular hypertrophy. Left ventricular diastolic parameters are indeterminate. Right Ventricle: The right ventricular size is normal. Right vetricular wall thickness was not assessed. Right ventricular systolic function is low normal. There is normal pulmonary artery systolic pressure. The tricuspid regurgitant velocity is 2.29 m/s, and with an assumed right atrial pressure of 3 mmHg, the estimated right ventricular systolic pressure is 24.0 mmHg. Left Atrium: Left atrial size was moderately dilated. Right Atrium: Right atrial size was normal in size. Pericardium: There is no evidence of pericardial effusion. Mitral Valve: The mitral valve is normal in structure. Mild mitral valve regurgitation. MV peak gradient, 4.4 mmHg. The mean mitral valve gradient is 2.0 mmHg. Tricuspid Valve: The tricuspid valve is normal in structure. Tricuspid valve regurgitation is mild. Aortic Valve: The aortic valve is normal in structure. Aortic valve regurgitation is not visualized.  Aortic valve mean gradient measures 1.0 mmHg. Aortic valve peak gradient measures 1.9  mmHg. Aortic valve area, by VTI measures 3.37 cm. Pulmonic Valve: The pulmonic valve was not well visualized. Pulmonic valve regurgitation is not visualized. Aorta: Aortic dilatation noted. There is moderate dilatation of the aortic root, measuring 44 mm. There is mild dilatation of the ascending aorta, measuring 42 mm. IAS/Shunts: No atrial level shunt detected by color flow Doppler.  LEFT VENTRICLE PLAX 2D LVIDd:         4.70 cm      Diastology LVIDs:         3.30 cm      LV e' medial:    11.50 cm/s LV PW:         1.00 cm      LV E/e' medial:  8.0 LV IVS:        1.30 cm      LV e' lateral:   13.50 cm/s LVOT diam:     2.40 cm      LV E/e' lateral: 6.8 LV SV:         39 LV SV Index:   18 LVOT Area:     4.52 cm  LV Volumes (MOD) LV vol d, MOD A2C: 167.0 ml LV vol d, MOD A4C: 103.0 ml LV vol s, MOD A2C: 66.2 ml LV vol s, MOD A4C: 50.8 ml LV SV MOD A2C:     100.8 ml LV SV MOD A4C:     103.0 ml LV SV MOD BP:      69.7 ml RIGHT VENTRICLE RV Basal diam:  3.50 cm RV Mid diam:    2.20 cm RV S prime:     6.64 cm/s LEFT ATRIUM             Index        RIGHT ATRIUM           Index LA diam:        5.10 cm 2.37 cm/m   RA Area:     12.80 cm LA Vol (A2C):   96.8 ml 44.99 ml/m  RA Volume:   25.90 ml  12.04 ml/m LA Vol (A4C):   95.1 ml 44.20 ml/m LA Biplane Vol: 99.3 ml 46.15 ml/m  AORTIC VALVE                    PULMONIC VALVE AV Area (Vmax):    3.61 cm     PV Vmax:       0.57 m/s AV Area (Vmean):   2.99 cm     PV Peak grad:  1.3 mmHg AV Area (VTI):     3.37 cm AV Vmax:           69.60 cm/s AV Vmean:          46.500 cm/s AV VTI:            0.116 m AV Peak Grad:      1.9 mmHg AV Mean Grad:      1.0 mmHg LVOT Vmax:         55.50 cm/s LVOT Vmean:        30.700 cm/s LVOT VTI:          0.086 m LVOT/AV VTI ratio: 0.74  AORTA Ao Root diam: 4.40 cm Ao Asc diam:  4.20 cm MITRAL VALVE               TRICUSPID VALVE MV Area (PHT): 6.17 cm  TR Peak grad:   21.0 mmHg MV Area VTI:   3.03 cm    TR Vmax:        229.00 cm/s MV Peak  grad:  4.4 mmHg MV Mean grad:  2.0 mmHg    SHUNTS MV Vmax:       1.05 m/s    Systemic VTI:  0.09 m MV Vmean:      54.4 cm/s   Systemic Diam: 2.40 cm MV Decel Time: 123 msec MV E velocity: 92.40 cm/s Vina Gull MD Electronically signed by Vina Gull MD Signature Date/Time: 05/10/2023/3:17:53 PM    Final    US  Paracentesis Result Date: 05/09/2023 INDICATION: Patient with history of chronic kidney disease, alcoholic cirrhosis, esophageal/gastric varices, substance abuse, recurrent ascites. Request received for diagnostic and therapeutic paracentesis. EXAM: ULTRASOUND GUIDED DIAGNOSTIC AND THERAPEUTIC PARACENTESIS MEDICATIONS: 8 ml 1% lidocaine  COMPLICATIONS: None immediate. PROCEDURE: Informed written consent was obtained from the patient after a discussion of the risks, benefits and alternatives to treatment. A timeout was performed prior to the initiation of the procedure. Initial ultrasound scanning demonstrates a large amount of ascites within the right mid to lower abdominal quadrant. The right mid to lower abdomen was prepped and draped in the usual sterile fashion. 1% lidocaine  was used for local anesthesia. Following this, a 19 gauge, 10-cm, Yueh catheter was introduced. An ultrasound image was saved for documentation purposes. The paracentesis was performed. The catheter was removed and a dressing was applied. The patient tolerated the procedure well without immediate post procedural complication. FINDINGS: A total of approximately 5.2 liters of hazy,amber fluid was removed. Samples were sent to the laboratory as requested by the clinical team. IMPRESSION: Successful ultrasound-guided diagnostic and therapeutic paracentesis yielding 5.2 liters of peritoneal fluid. PLAN: If the patient eventually requires >/=2 paracenteses in a 30 day period, candidacy for formal evaluation by the [facility] will be assessed. Performed by: Franky Kelsie RIGGERS Electronically  Signed   By: Marcey Moan M.D.   On: 05/09/2023 16:18    Cardiac Studies   Echo 07/2022:  1. Left ventricular ejection fraction, by estimation, is 55 to 60%. The  left ventricle has normal function. The left ventricle has no regional  wall motion abnormalities. There is mild concentric left ventricular  hypertrophy. Left ventricular diastolic  parameters are indeterminate.   2. Right ventricular systolic function is normal. The right ventricular  size is normal. There is mildly elevated pulmonary artery systolic  pressure.   3. Left atrial size was severely dilated.   4. Right atrial size was mildly dilated.   5. The mitral valve is normal in structure. No evidence of mitral valve  regurgitation. No evidence of mitral stenosis.   6. The aortic valve is normal in structure. Aortic valve regurgitation is  not visualized. No aortic stenosis is present.   7. Aortic dilatation noted. There is mild dilatation of the aortic root,  measuring 41 mm. There is mild dilatation of the ascending aorta,  measuring 40 mm.   8. The inferior vena cava is dilated in size with <50% respiratory  variability, suggesting right atrial pressure of 15 mmHg.   Patient Profile     68 y.o. male with a hx of alcoholic cirrhosis, hepatitis C treated with Epclusa in 2019 , portal gastropathy, esophageal varices, prior GI bleed from gastric varices, GERD, ascites, HFpEF, CKD IIIb, chronic anemia, prior ETOH abuse, HTN, HLD, gout, polysubstance abuse with cocaine and THC, left renal cell carcinoma treated with  cryoablation 2022,  who is being seen 05/08/2023 for the evaluation of A fib   Assessment & Plan    Atrial flutter - new diagnosis this admission EKG yesterday shows atrial flutter     Rates have been controlled overall I have not seen any atrial fibrillation though with severe LAE it is a risk   Would review with EP if patient an ablation candidate      Keep on tele for now  Not on anticoagulation    HFpEF  Hx of medical noncompliance  - echo 07/2022 as above - pt has been out of lasix  for several months - complicated by underlying cirrhosis He was on  lasix  60 iv bid and 50 spiro BUN / Cr are still high   40/ 2.1 yesterday Would repeat  labs today   Renal   Repeat BMET today  Yesterday Cr 2.1  (Cr was 1.65 in July 2024)  Cirrhosis, esophageal and gastric varices, prior GI bleed - would be high bleeding risk with OAC  Pt has had one paracentesis so far     Heme  Hgb 8.8  Pt 100        For questions or updates, please contact [facility] Please consult [email or web address] for contact info under        Signed, Vina Gull, MD  05/11/2023, 6:53 AM

## 2023-05-12 ENCOUNTER — Inpatient Hospital Stay (HOSPITAL_COMMUNITY): Payer: No Typology Code available for payment source

## 2023-05-12 DIAGNOSIS — K7469 Other cirrhosis of liver: Secondary | ICD-10-CM

## 2023-05-12 DIAGNOSIS — I4891 Unspecified atrial fibrillation: Secondary | ICD-10-CM | POA: Diagnosis not present

## 2023-05-12 DIAGNOSIS — I4892 Unspecified atrial flutter: Secondary | ICD-10-CM

## 2023-05-12 LAB — BASIC METABOLIC PANEL
Anion gap: 7 (ref 5–15)
BUN: 38 mg/dL — ABNORMAL HIGH (ref 8–23)
CO2: 26 mmol/L (ref 22–32)
Calcium: 8.9 mg/dL (ref 8.9–10.3)
Chloride: 101 mmol/L (ref 98–111)
Creatinine, Ser: 1.88 mg/dL — ABNORMAL HIGH (ref 0.61–1.24)
GFR, Estimated: 39 mL/min — ABNORMAL LOW (ref >60)
Glucose, Bld: 92 mg/dL (ref 70–99)
Potassium: 4.5 mmol/L (ref 3.5–5.1)
Sodium: 134 mmol/L — ABNORMAL LOW (ref 135–145)

## 2023-05-12 LAB — CBC
HCT: 28.7 % — ABNORMAL LOW (ref 39.0–52.0)
Hemoglobin: 9.3 g/dL — ABNORMAL LOW (ref 13.0–17.0)
MCH: 25.3 pg — ABNORMAL LOW (ref 26.0–34.0)
MCHC: 32.4 g/dL (ref 30.0–36.0)
MCV: 78 fL — ABNORMAL LOW (ref 80.0–100.0)
Platelets: 99 10*3/uL — ABNORMAL LOW (ref 150–400)
RBC: 3.68 MIL/uL — ABNORMAL LOW (ref 4.22–5.81)
RDW: 18.8 % — ABNORMAL HIGH (ref 11.5–15.5)
WBC: 5 10*3/uL (ref 4.0–10.5)
nRBC: 0 % (ref 0.0–0.2)

## 2023-05-12 LAB — HEPATIC FUNCTION PANEL
ALT: 7 U/L (ref 0–44)
AST: 15 U/L (ref 15–41)
Albumin: 3.6 g/dL (ref 3.5–5.0)
Alkaline Phosphatase: 45 U/L (ref 38–126)
Bilirubin, Direct: 0.3 mg/dL — ABNORMAL HIGH (ref 0.0–0.2)
Indirect Bilirubin: 0.6 mg/dL (ref 0.3–0.9)
Total Bilirubin: 0.9 mg/dL (ref 0.0–1.2)
Total Protein: 7 g/dL (ref 6.5–8.1)

## 2023-05-12 LAB — MAGNESIUM: Magnesium: 2 mg/dL (ref 1.7–2.4)

## 2023-05-12 LAB — GLUCOSE, CAPILLARY
Glucose-Capillary: 103 mg/dL — ABNORMAL HIGH (ref 70–99)
Glucose-Capillary: 85 mg/dL (ref 70–99)
Glucose-Capillary: 79 mg/dL (ref 70–99)
Glucose-Capillary: 139 mg/dL — ABNORMAL HIGH (ref 70–99)

## 2023-05-12 LAB — TSH: TSH: 3.089 u[IU]/mL (ref 0.350–4.500)

## 2023-05-12 MED ORDER — LIDOCAINE HCL 1 % IJ SOLN
INTRAMUSCULAR | Status: AC
  Filled 2023-05-12: qty 20

## 2023-05-12 NOTE — Progress Notes (Signed)
 Mobility Specialist - Progress Note   05/12/23 1238  Mobility  Activity Ambulated with assistance in hallway  Level of Assistance Standby assist, set-up cues, supervision of patient - no hands on  Assistive Device Front wheel walker  Distance Ambulated (ft) 200 ft  Range of Motion/Exercises Active  Activity Response Tolerated well  Mobility Referral Yes  Mobility visit 1 Mobility  Mobility Specialist Start Time (ACUTE ONLY) 1222  Mobility Specialist Stop Time (ACUTE ONLY) 1238  Mobility Specialist Time Calculation (min) (ACUTE ONLY) 16 min   Pt was found in bed and agreeable to ambulate. At EOS c/o back pain. RN notified. Was left in bed with all needs met. Call bell in reach.  John [name] Mobility Specialist

## 2023-05-12 NOTE — Progress Notes (Signed)
 PROGRESS NOTE    John [name]  FMW:[id number] DOB: [birth year] DOA: 05/07/2023 PCP: Patient, No Pcp Per   Brief Narrative:  68 year old male with history of CKD stage IIIa, alcoholic cirrhosis with variceal bleeding and paracentesis many years ago, quit drinking alcohol greater than 10 years ago, ongoing cocaine abuse, noncompliance with medications including diuretics for the last several months presented with worsening abdominal and leg swelling.  On presentation, he was found to have pain new onset A-fib with RVR.  Cardiology was consulted.  He underwent paracentesis and removal of 5.2 L fluid by IR on 05/09/2023.  Assessment & Plan:   Newly diagnosed atrial flutter -Currently rate controlled.  Continue Coreg .  Cardiology following.  Not a good candidate for anticoagulation because of cirrhosis with history of esophageal and gastric varices  Acute on chronic diastolic heart failure -Most recent echo showed EF of 55 to 60%.  Cardiology following and managing diuretics.  Diuretics held on on 05/10/23 by cardiology due to increased creatinine.  Strict input and output.  Daily weights.  Fluid restriction.  Has diuresed well during this hospitalization.  Continue Coreg   Decompensated cirrhosis of liver with ascites and history of esophageal and gastric varices with prior history of GI bleed -Status post paracentesis and removal of 5.2 L fluid by IR on 05/09/2023.  Abdomen still looks distended.  Will order ultrasound-guided paracentesis again today. -Diuretics as per cardiology.  Currently on hold.  Outpatient follow-up with GI  AKI on CKD stage IIIa -creatinine of 1.43 on 11/05/2022.  Creatinine improving to 1.88 this morning.  Monitor  Microcytic anemia/iron deficiency anemia Anemia of chronic disease -Continue supplemental oral iron.  Hemoglobin stable.  Monitor intermittently  Thrombocytopenia -Possibly due to liver cirrhosis.  Platelets stable.  No signs of bleeding.   Monitor  Hypomagnesemia -Resolved  Hyponatremia -Mild.  Monitor  Cocaine abuse -counseled by prior hospitalist.  [facility] consult  History of gout -Continue allopurinol   Obesity -Outpatient follow-up  Diabetes mellitus type 2 -Blood sugars stable.  Continue CBGs with SSI  DVT prophylaxis: SCDs Code Status: Full Family Communication: None at bedside Disposition Plan: Status is: Inpatient Remains inpatient appropriate because: Of severity of illness    Consultants: Cardiology  Procedures: As above  Antimicrobials: None   Subjective: Patient seen and examined at bedside.  Denies chest pain, worsening shortness of breath, fever.  States that his belly is still very distended.   Objective: Vitals:   05/11/23 1718 05/11/23 2107 05/12/23 0500 05/12/23 0609  BP: 110/80 99/71  104/80  Pulse: 73 78  86  Resp:  17  17  Temp:  (!) 97.5 F (36.4 C)  98.5 F (36.9 C)  TempSrc:  Oral  Oral  SpO2:  100%  98%  Weight:   103.1 kg   Height:        Intake/Output Summary (Last 24 hours) at 05/12/2023 0749 Last data filed at 05/11/2023 2120 Gross per 24 hour  Intake 680 ml  Output 600 ml  Net 80 ml   Filed Weights   05/10/23 0429 05/11/23 0530 05/12/23 0500  Weight: 102.4 kg 101.7 kg 103.1 kg    Examination:  General: No acute distress.  Currently on room air.  Chronically ill and deconditioned looking. ENT/neck: No JVD elevation or palpable neck masses noted respiratory: Bilateral decreased breath sounds at bases with scattered crackles CVS: Rate mostly controlled; S1 and S2 are heard Abdominal: Soft, nontender, remains very distended; no organomegaly, bowel sounds are heard normally Extremities: No clubbing;  bilateral lower extremity edema present  CNS: Alert and oriented.  No focal neurologic deficit.  Able to move extremities Lymph: No palpable lymphadenopathy noted Skin: No obvious petechiae/rashes psych: Currently not agitated.  Affect is flat.   Musculoskeletal:  No obvious joint tenderness/erythema    Data Reviewed: I have personally reviewed following labs and imaging studies  CBC: Recent Labs  Lab 05/08/23 0624 05/09/23 0405 05/10/23 0450 05/11/23 0430 05/12/23 0417  WBC 3.7* 6.5 5.1 4.8 5.0  HGB 9.1* 8.8* 8.7* 9.1* 9.3*  HCT 28.3* 27.8* 27.0* 29.0* 28.7*  MCV 79.1* 77.7* 77.4* 78.4* 78.0*  PLT 98* 100* 95* 98* 99*   Basic Metabolic Panel: Recent Labs  Lab 05/08/23 0624 05/09/23 0405 05/10/23 0450 05/11/23 0430 05/11/23 1712 05/12/23 0417  NA 135 136 137 134* 134* 134*  K 3.9 3.8 4.0 4.2 4.6 4.5  CL 100 103 102 100 99 101  CO2 23 24 25 26 27 26   GLUCOSE 125* 106* 87 96 87 92  BUN 27* 33* 39* 40* 37* 38*  CREATININE 2.27* 2.16* 2.46* 2.08* 2.12* 1.88*  CALCIUM  8.9 8.9 8.8* 8.7* 8.9 8.9  MG 2.3 2.1 1.9 1.9  --  2.0  PHOS  --  2.8 2.9 2.6  --   --    GFR: Estimated Creatinine Clearance: 45 mL/min (A) (by C-G formula based on SCr of 1.88 mg/dL (H)). Liver Function Tests: Recent Labs  Lab 05/08/23 0624 05/10/23 0450 05/11/23 0430 05/12/23 0417  AST 16 13* 12* 15  ALT 9 6 6 7   ALKPHOS 50 53 44 45  BILITOT 1.6* 1.0 1.0 0.9  PROT 7.8 6.8 6.5 7.0  ALBUMIN  3.5 3.5 3.4* 3.6   No results for input(s): LIPASE, AMYLASE in the last 168 hours. Recent Labs  Lab 05/10/23 0450  AMMONIA 20   Coagulation Profile: Recent Labs  Lab 05/08/23 1229  INR 1.6*   Cardiac Enzymes: No results for input(s): CKTOTAL, CKMB, CKMBINDEX, TROPONINI in the last 168 hours. BNP (last 3 results) Recent Labs    11/05/22 1358 11/28/22 0849  PROBNP 3,248* 4,858*   HbA1C: No results for input(s): HGBA1C in the last 72 hours.  CBG: Recent Labs  Lab 05/11/23 0737 05/11/23 1116 05/11/23 1620 05/11/23 2104 05/12/23 0722  GLUCAP 95 103* 94 98 103*   Lipid Profile: No results for input(s): CHOL, HDL, LDLCALC, TRIG, CHOLHDL, LDLDIRECT in the last 72 hours. Thyroid Function Tests: No results for input(s):  TSH, T4TOTAL, FREET4, T3FREE, THYROIDAB in the last 72 hours. Anemia Panel: No results for input(s): VITAMINB12, FOLATE, FERRITIN, TIBC, IRON, RETICCTPCT in the last 72 hours.  Sepsis Labs: Recent Labs  Lab 05/08/23 1229  LATICACIDVEN 2.0*    Recent Results (from the past 240 hours)  SARS Coronavirus 2 by RT PCR (hospital order, performed in [facility] hospital lab) *cepheid single result test* Anterior Nasal Swab     Status: None   Collection Time: 05/08/23 12:01 AM   Specimen: Anterior Nasal Swab  Result Value Ref Range Status   SARS Coronavirus 2 by RT PCR NEGATIVE NEGATIVE Final    Comment: (NOTE) SARS-CoV-2 target nucleic acids are NOT DETECTED.  The SARS-CoV-2 RNA is generally detectable in upper and lower respiratory specimens during the acute phase of infection. The lowest concentration of SARS-CoV-2 viral copies this assay can detect is 250 copies / mL. A negative result does not preclude SARS-CoV-2 infection and should not be used as the sole basis for treatment or other patient management decisions.  A negative result may occur with improper specimen collection / handling, submission of specimen other than nasopharyngeal swab, presence of viral mutation(s) within the areas targeted by this assay, and inadequate number of viral copies (<250 copies / mL). A negative result must be combined with clinical observations, patient history, and epidemiological information.  Fact Sheet for Patients:   roadlaptop.co.za  Fact Sheet for Healthcare Providers: http://kim-miller.com/  This test is not yet approved or  cleared by the [place]  FDA and has been authorized for detection and/or diagnosis of SARS-CoV-2 by FDA under an Emergency Use Authorization (EUA).  This EUA will remain in effect (meaning this test can be used) for the duration of the COVID-19 declaration under Section 564(b)(1) of the Act, 21  U.S.C. section 360bbb-3(b)(1), unless the authorization is terminated or revoked sooner.  Performed at [facility], [address], [place], [place] [address]   Body fluid culture w Gram Stain     Status: None (Preliminary result)   Collection Time: 05/09/23  1:38 PM   Specimen: PATH Cytology Peritoneal fluid  Result Value Ref Range Status   Specimen Description   Final    PERITONEAL Performed at [facility], [address], [place], [place] [address]    Special Requests   Final    NONE Performed at [facility], [address] [place], [place] [address]    Gram Stain NO WBC SEEN NO ORGANISMS SEEN   Final   Culture   Final    NO GROWTH 2 DAYS Performed at [facility], [address], [place], [place] [address]    Report Status PENDING  Incomplete         Radiology Studies: ECHOCARDIOGRAM COMPLETE Result Date: 05/10/2023    ECHOCARDIOGRAM REPORT   Patient Name:   John [name] Date of Exam: 05/10/2023 Medical Rec #:  [id number]           Height:       68.0 in Accession #:    [id number]          Weight:       225.7 lb Date of Birth:  26-Jun-1955            BSA:          2.152 m Patient Age:    67 years            BP:           95/76 mmHg Patient Gender: M                   HR:           80 bpm. Exam Location:  Inpatient Procedure: 2D Echo, Cardiac Doppler and Color Doppler Indications:    Atrial Fibrillation  History:        Patient has prior history of Echocardiogram examinations, most                 recent 08/02/2022. CAD, Stroke and COPD, Signs/Symptoms:Shortness                 of Breath; Risk Factors:Hypertension.  Sonographer:    Juanita Shaw Referring Phys: [id number] TIFFANY [place] IMPRESSIONS  1. Left ventricular ejection fraction, by estimation, is 55 to 60%. The left ventricle has normal function. The left ventricle has no regional wall motion abnormalities. There is mild left ventricular hypertrophy. Left  ventricular diastolic parameters are indeterminate.  2. Right ventricular systolic function is  low normal. The right ventricular size is normal. There is normal pulmonary artery systolic pressure.  3. Left atrial size was moderately dilated.  4. The mitral valve is normal in structure. Mild mitral valve regurgitation.  5. The aortic valve is normal in structure. Aortic valve regurgitation is not visualized.  6. Aortic dilatation noted. There is moderate dilatation of the aortic root, measuring 44 mm. There is mild dilatation of the ascending aorta, measuring 42 mm. FINDINGS  Left Ventricle: Left ventricular ejection fraction, by estimation, is 55 to 60%. The left ventricle has normal function. The left ventricle has no regional wall motion abnormalities. The left ventricular internal cavity size was normal in size. There is  mild left ventricular hypertrophy. Left ventricular diastolic parameters are indeterminate. Right Ventricle: The right ventricular size is normal. Right vetricular wall thickness was not assessed. Right ventricular systolic function is low normal. There is normal pulmonary artery systolic pressure. The tricuspid regurgitant velocity is 2.29 m/s, and with an assumed right atrial pressure of 3 mmHg, the estimated right ventricular systolic pressure is 24.0 mmHg. Left Atrium: Left atrial size was moderately dilated. Right Atrium: Right atrial size was normal in size. Pericardium: There is no evidence of pericardial effusion. Mitral Valve: The mitral valve is normal in structure. Mild mitral valve regurgitation. MV peak gradient, 4.4 mmHg. The mean mitral valve gradient is 2.0 mmHg. Tricuspid Valve: The tricuspid valve is normal in structure. Tricuspid valve regurgitation is mild. Aortic Valve: The aortic valve is normal in structure. Aortic valve regurgitation is not visualized. Aortic valve mean gradient measures 1.0 mmHg. Aortic valve peak gradient measures 1.9 mmHg. Aortic valve area, by VTI  measures 3.37 cm. Pulmonic Valve: The pulmonic valve was not well visualized. Pulmonic valve regurgitation is not visualized. Aorta: Aortic dilatation noted. There is moderate dilatation of the aortic root, measuring 44 mm. There is mild dilatation of the ascending aorta, measuring 42 mm. IAS/Shunts: No atrial level shunt detected by color flow Doppler.  LEFT VENTRICLE PLAX 2D LVIDd:         4.70 cm      Diastology LVIDs:         3.30 cm      LV e' medial:    11.50 cm/s LV PW:         1.00 cm      LV E/e' medial:  8.0 LV IVS:        1.30 cm      LV e' lateral:   13.50 cm/s LVOT diam:     2.40 cm      LV E/e' lateral: 6.8 LV SV:         39 LV SV Index:   18 LVOT Area:     4.52 cm  LV Volumes (MOD) LV vol d, MOD A2C: 167.0 ml LV vol d, MOD A4C: 103.0 ml LV vol s, MOD A2C: 66.2 ml LV vol s, MOD A4C: 50.8 ml LV SV MOD A2C:     100.8 ml LV SV MOD A4C:     103.0 ml LV SV MOD BP:      69.7 ml RIGHT VENTRICLE RV Basal diam:  3.50 cm RV Mid diam:    2.20 cm RV S prime:     6.64 cm/s LEFT ATRIUM             Index        RIGHT ATRIUM           Index LA diam:  5.10 cm 2.37 cm/m   RA Area:     12.80 cm LA Vol (A2C):   96.8 ml 44.99 ml/m  RA Volume:   25.90 ml  12.04 ml/m LA Vol (A4C):   95.1 ml 44.20 ml/m LA Biplane Vol: 99.3 ml 46.15 ml/m  AORTIC VALVE                    PULMONIC VALVE AV Area (Vmax):    3.61 cm     PV Vmax:       0.57 m/s AV Area (Vmean):   2.99 cm     PV Peak grad:  1.3 mmHg AV Area (VTI):     3.37 cm AV Vmax:           69.60 cm/s AV Vmean:          46.500 cm/s AV VTI:            0.116 m AV Peak Grad:      1.9 mmHg AV Mean Grad:      1.0 mmHg LVOT Vmax:         55.50 cm/s LVOT Vmean:        30.700 cm/s LVOT VTI:          0.086 m LVOT/AV VTI ratio: 0.74  AORTA Ao Root diam: 4.40 cm Ao Asc diam:  4.20 cm MITRAL VALVE               TRICUSPID VALVE MV Area (PHT): 6.17 cm    TR Peak grad:   21.0 mmHg MV Area VTI:   3.03 cm    TR Vmax:        229.00 cm/s MV Peak grad:  4.4 mmHg MV Mean grad:  2.0  mmHg    SHUNTS MV Vmax:       1.05 m/s    Systemic VTI:  0.09 m MV Vmean:      54.4 cm/s   Systemic Diam: 2.40 cm MV Decel Time: 123 msec MV E velocity: 92.40 cm/s Vina Gull MD Electronically signed by Vina Gull MD Signature Date/Time: 05/10/2023/3:17:53 PM    Final         Scheduled Meds:  allopurinol   100 mg Oral Daily   carvedilol   3.125 mg Oral BID WC   ferrous sulfate   325 mg Oral Q M,W,F   folic acid   1 mg Oral Daily   insulin  aspart  0-6 Units Subcutaneous TID WC   lidocaine   1 patch Transdermal Q24H   melatonin  5 mg Oral QHS   pantoprazole   40 mg Oral Daily   thiamine   100 mg Oral Daily   Continuous Infusions:          Sophie Mao, MD [facility] 05/12/2023, 7:49 AM

## 2023-05-12 NOTE — Progress Notes (Addendum)
 Progress Note  Patient Name: John [name] Date of Encounter: 05/12/2023  Primary Cardiologist: Annabella Scarce, MD  Subjective   Feeling much better after paracentesis. Denies CP, SOB. BP soft. Asking re: discharge plans.  Inpatient Medications    Scheduled Meds:  allopurinol   100 mg Oral Daily   carvedilol   3.125 mg Oral BID WC   ferrous sulfate   325 mg Oral Q M,W,F   folic acid   1 mg Oral Daily   insulin  aspart  0-6 Units Subcutaneous TID WC   lidocaine   1 patch Transdermal Q24H   melatonin  5 mg Oral QHS   pantoprazole   40 mg Oral Daily   thiamine   100 mg Oral Daily   Continuous Infusions:  PRN Meds: magnesium  hydroxide, ondansetron  **OR** ondansetron  (ZOFRAN ) IV, oxyCODONE    Vital Signs    Vitals:   05/12/23 1057 05/12/23 1114 05/12/23 1124 05/12/23 1132  BP: 116/88 99/77 94/85  95/69  Pulse:      Resp:      Temp:      TempSrc:      SpO2:      Weight:      Height:        Intake/Output Summary (Last 24 hours) at 05/12/2023 1230 Last data filed at 05/12/2023 1211 Gross per 24 hour  Intake 680 ml  Output 700 ml  Net -20 ml      05/12/2023    5:00 AM 05/11/2023    5:30 AM 05/10/2023    4:29 AM  Last 3 Weights  Weight (lbs) 227 lb 4.7 oz 224 lb 3.3 oz 225 lb 12 oz  Weight (kg) 103.1 kg 101.7 kg 102.4 kg     Telemetry    Atrial flutter CVR - Personally Reviewed  Physical Exam   GEN: No acute distress.  HEENT: Normocephalic, atraumatic, sclera non-icteric. Neck: No JVD or bruits. Cardiac: RRR no murmurs, rubs, or gallops.  Respiratory: Quiet scattered end expiratory wheezing bilaterally. Breathing is unlabored. GI: Soft, nontender, non-distended, BS +x 4. MS: no deformity. Extremities: No clubbing or cyanosis. No edema. Distal pedal pulses are 2+ and equal bilaterally. Neuro:  AAOx3. Follows commands. Psych:  Responds to questions appropriately with a normal affect.  Labs    High Sensitivity Troponin:  No results for input(s):  TROPONINIHS in the last 720 hours.    Cardiac EnzymesNo results for input(s): TROPONINI in the last 168 hours. No results for input(s): TROPIPOC in the last 168 hours.   Chemistry Recent Labs  Lab 05/10/23 0450 05/11/23 0430 05/11/23 1712 05/12/23 0417  NA 137 134* 134* 134*  K 4.0 4.2 4.6 4.5  CL 102 100 99 101  CO2 25 26 27 26   GLUCOSE 87 96 87 92  BUN 39* 40* 37* 38*  CREATININE 2.46* 2.08* 2.12* 1.88*  CALCIUM  8.8* 8.7* 8.9 8.9  PROT 6.8 6.5  --  7.0  ALBUMIN  3.5 3.4*  --  3.6  AST 13* 12*  --  15  ALT 6 6  --  7  ALKPHOS 53 44  --  45  BILITOT 1.0 1.0  --  0.9  GFRNONAA 28* 34* 34* 39*  ANIONGAP 10 8 8 7      Hematology Recent Labs  Lab 05/10/23 0450 05/11/23 0430 05/12/23 0417  WBC 5.1 4.8 5.0  RBC 3.49* 3.70* 3.68*  HGB 8.7* 9.1* 9.3*  HCT 27.0* 29.0* 28.7*  MCV 77.4* 78.4* 78.0*  MCH 24.9* 24.6* 25.3*  MCHC 32.2 31.4 32.4  RDW 18.7* 18.5* 18.8*  PLT 95* 98* 99*    BNP Recent Labs  Lab 05/07/23 1622  BNP 219.8*     DDimer No results for input(s): DDIMER in the last 168 hours.   Radiology    US  Paracentesis Result Date: 05/12/2023 Gery Daved SA[place], [name]     05/12/2023 12:07 PM PROCEDURE SUMMARY: Successful US  guided paracentesis from right mid to lower abdominal quadrant.SABRA Amour 5L of amber fluid. No immediate complications. Pt tolerated well. No labs ordered. EBL < 5mL Marissa N Caperilla PA-C 05/12/2023 11:18 AM    Cardiac Studies   Echo 07/2022:  1. Left ventricular ejection fraction, by estimation, is 55 to 60%. The  left ventricle has normal function. The left ventricle has no regional  wall motion abnormalities. There is mild concentric left ventricular  hypertrophy. Left ventricular diastolic  parameters are indeterminate.   2. Right ventricular systolic function is normal. The right ventricular  size is normal. There is mildly elevated pulmonary artery systolic  pressure.   3. Left atrial size was severely dilated.   4.  Right atrial size was mildly dilated.   5. The mitral valve is normal in structure. No evidence of mitral valve  regurgitation. No evidence of mitral stenosis.   6. The aortic valve is normal in structure. Aortic valve regurgitation is  not visualized. No aortic stenosis is present.   7. Aortic dilatation noted. There is mild dilatation of the aortic root,  measuring 41 mm. There is mild dilatation of the ascending aorta,  measuring 40 mm.   8. The inferior vena cava is dilated in size with <50% respiratory  variability, suggesting right atrial pressure of 15 mmHg.   Patient Profile     68 y.o. male with alcoholic cirrhosis, hepatitis C treated with Epclusa in 2019, portal gastropathy, esophageal varices, prior GI bleed from gastric varices, ascites, GERD, chronic HFpEF, CKD IIIb, chronic anemia, prior ETOH abuse, dilated aorta, HTN, HLD, gout, polysubstance abuse with ongoing cocaine and THC, left renal cell carcinoma treated with cryoablation 2022 admitted with 2-3 weeks of DOE. He had been out of his diuretics. He has had numerous hospitalizations in the past for decompensated cirrhosis and CHF. Cardiology following for new atrial flutter and a/c HFpEF in setting of decompensated cirrhosis.  Assessment & Plan    1. Atrial flutter, new diagnosis  - unclear duration, does not seems to have significant symptoms, DOE likely due to worsening ascites - bleeding risk from his underlying liver disease felt too high for anticoagulation, recommended to explore ablation/Watchman as outpatient - not requiring AVN blocking agents, carvedilol  held this AM due to hypotension - add on TSH to labs  2. Volume overload with Acute on Chronic HFpEF with cirrhosis, AKI on CKD stage III - suspect picture is cirrhosis driven - echo this admission EF 55-60%, low normal RVSF, mild MR, moderate dilation of aortic root and mild dilation of ascending aorta - spironolactone  and IV Lasix  discontinued several days  ago - previous baseline Cr variable 1.6-2.2 range, challenging to assess with intermittent med adherence - here peaking at 2.46 prompting med discontinuation - Cr improved to 1.88 today - s/p paracentesis with -5L today - will review diuretic recs with MD - recommend primary team arrange close GI f/u at DC - will stop carvedilol  given persistent issues with hypotension, wheezing, also cocaine use  3. Polysubstance abuse - quiet ETOH remotely, has been using cocaine per notes, intending to stop  4. Dilation of aorta - recommend outpatient f/u  Remainder  per primary team Has f/u already arranged with Glendia Ferrier PA-C on 1/24, keep  For questions or updates, please contact [facility] Please consult [email or web address] for contact info under Cardiology/STEMI.  Signed, Raphael LOISE Bring, PA-C 05/12/2023, 12:30 PM

## 2023-05-12 NOTE — TOC Initial Note (Signed)
 Transition of Care [facility]) - Initial/Assessment Note    Patient Details  Name: John [name] MRN: [id number] Date of Birth: 1955-06-19  Transition of Care [facility]) CM/SW Contact:    Tawni CHRISTELLA Eva, LCSW Phone Number: 05/12/2023, 4:05 PM  Clinical Narrative:                 CSW spoke with the pt regarding SDOH concerns and provided a food pantry list and social service resources. The pt reported that he is trying to change his insurance from [employer] [facility] to [facility] and inquired if the CSW knew which option was better. CSW explained that she would not be able to recommend an insurance company. The pt has no DME or transportation needs. TOC  sign off.  Expected Discharge Plan: Home/Self Care Barriers to Discharge: Continued Medical Work up   Patient Goals and CMS Choice            Expected Discharge Plan and Services                                              Prior Living Arrangements/Services     Patient language and need for interpreter reviewed:: Yes Do you feel safe going back to the place where you live?: Yes      Need for Family Participation in Patient Care: No (Comment) Care giver support system in place?: No (comment)      Activities of Daily Living   ADL Screening (condition at time of admission) Independently performs ADLs?: Yes (appropriate for developmental age) Is the patient deaf or have difficulty hearing?: No Does the patient have difficulty seeing, even when wearing glasses/contacts?: No Does the patient have difficulty concentrating, remembering, or making decisions?: No  Permission Sought/Granted                  Emotional Assessment Appearance:: Appears stated age Attitude/Demeanor/Rapport: Engaged, Gracious Affect (typically observed): Accepting Orientation: : Oriented to Self, Oriented to Place, Oriented to  Time, Oriented to Situation   Psych Involvement: No (comment)  Admission diagnosis:  New  onset atrial fibrillation (HCC) [I48.91] COPD with acute exacerbation (HCC) [J44.1] Atrial fibrillation, unspecified type (HCC) [I48.91] Acute on chronic congestive heart failure, unspecified heart failure type (HCC) [I50.9] Patient Active Problem List   Diagnosis Date Noted   Atrial flutter (HCC) 05/12/2023   Hypomagnesemia 05/08/2023   New onset atrial fibrillation (HCC) 05/07/2023   Stasis dermatitis of both legs 08/05/2022   Glaucoma (increased eye pressure) 08/05/2022   COPD with acute exacerbation (HCC) 08/05/2022   Acute exacerbation of congestive heart failure (HCC) 07/31/2022   Renal mass, left 07/05/2020   Umbilical hernia without obstruction and without gangrene 04/27/2019   Severe obesity (BMI 35.0-39.9) with comorbidity (HCC) 06/25/2018   Acute gouty arthritis 05/06/2018   Polyarthralgia 05/03/2018   History of renal cell carcinoma 01/12/2018   Esophageal varices without bleeding (HCC)    Renal cell carcinoma of left kidney (HCC) 11/08/2016   Malnutrition of moderate degree 07/20/2015   (HFpEF) heart failure with preserved ejection fraction (HCC) 07/19/2015   Abdominal pain 07/19/2015   Renal failure (ARF), acute on chronic (HCC) 07/19/2015   Acute-on-chronic kidney injury (HCC)    Hyponatremia 07/07/2015   Dehydration 07/07/2015   Acute respiratory failure (HCC) 07/07/2015   Chronic kidney disease, stage 3 (HCC) 05/25/2015   CAP (community  acquired pneumonia) 05/23/2015   Acute respiratory failure with hypoxia (HCC) 05/23/2015   Fluid overload 10/31/2014   Hepatitis C 10/31/2014   Ascites of liver 10/31/2014   Hepatitis, viral    Edema of abdominal wall    Pedal edema    Alcoholism (HCC)    Abdominal edema on examination 09/01/2014   Chronic liver failure (HCC)    Alcoholic cirrhosis of liver with ascites (HCC)    Chronic hepatitis C with cirrhosis (HCC)    Cardiac hypertrophy    Fever of unknown origin    Diastolic dysfunction    Altered mental status     Metabolic encephalopathy    Pulmonary edema    Acute encephalopathy    Ascites due to alcoholic cirrhosis (HCC)    Hypernatremia    Other specified fever    Varices, gastric    Acute on chronic renal failure (HCC)    Elevated transaminase level    Essential hypertension    Gastric varices    Upper GI bleed 08/14/2014   Acute blood loss anemia 08/14/2014   Alcohol abuse 08/14/2014   Coagulopathy (HCC) 08/14/2014   AKI (acute kidney injury) (HCC) 08/14/2014   GI bleed 08/14/2014   GIB (gastrointestinal bleeding) 08/14/2014   Bleeding gastric varices 08/14/2014   Acute upper GI bleed    Cirrhosis (HCC) 07/27/2014   Abdominal pain, generalized 07/27/2014   Ascites 07/26/2014   Optic neuritis 07/11/2014   Abnormal LFTs    Vision loss of left eye 06/24/2014   Accelerated hypertension 06/24/2014   Elevated LFTs 06/24/2014   Polysubstance abuse (HCC) 06/24/2014   Stage 3b chronic kidney disease (HCC) 06/24/2014   SOB (shortness of breath) 06/24/2014   Vision loss, left eye 06/24/2014   Thrombocytopenia (HCC) 06/24/2014   Hypokalemia 06/24/2014   Elevated troponin 06/24/2014   Essential hypertension, benign 12/29/2012   Neck pain 12/29/2012   Low back pain 12/29/2012   PCP:  Patient, No Pcp Per Pharmacy:  No Pharmacies Listed    Social Drivers of Health (SDOH) Social History: SDOH Screenings   Food Insecurity: Food Insecurity Present (05/12/2023)  Housing: Low Risk  (05/08/2023)  Transportation Needs: No Transportation Needs (05/08/2023)  Utilities: Not At Risk (05/08/2023)  Social Connections: Moderately Isolated (05/08/2023)  Tobacco Use: Medium Risk (05/08/2023)   SDOH Interventions: Food Insecurity Interventions: Inpatient TOC (provided resources)   Readmission Risk Interventions     No data to display

## 2023-05-12 NOTE — Plan of Care (Signed)

## 2023-05-12 NOTE — Procedures (Signed)
 PROCEDURE SUMMARY:  Successful US  guided paracentesis from right mid to lower abdominal quadrant.SABRA Amour 5L of amber fluid.  No immediate complications.  Pt tolerated well.   No labs ordered.  EBL < 5mL  Willisha Sligar N Mackayla Mullins PA-C 05/12/2023 11:18 AM

## 2023-05-12 NOTE — Progress Notes (Signed)
 Per d/w MD, keep f/u 1/24 as scheduled. Outlined on AVS.

## 2023-05-13 DIAGNOSIS — K7030 Alcoholic cirrhosis of liver without ascites: Secondary | ICD-10-CM | POA: Diagnosis not present

## 2023-05-13 DIAGNOSIS — F1410 Cocaine abuse, uncomplicated: Secondary | ICD-10-CM

## 2023-05-13 DIAGNOSIS — K7031 Alcoholic cirrhosis of liver with ascites: Secondary | ICD-10-CM | POA: Diagnosis not present

## 2023-05-13 DIAGNOSIS — E119 Type 2 diabetes mellitus without complications: Secondary | ICD-10-CM | POA: Diagnosis not present

## 2023-05-13 DIAGNOSIS — D509 Iron deficiency anemia, unspecified: Secondary | ICD-10-CM | POA: Diagnosis not present

## 2023-05-13 DIAGNOSIS — D696 Thrombocytopenia, unspecified: Secondary | ICD-10-CM

## 2023-05-13 DIAGNOSIS — I4891 Unspecified atrial fibrillation: Secondary | ICD-10-CM | POA: Diagnosis not present

## 2023-05-13 LAB — BASIC METABOLIC PANEL
Anion gap: 6 (ref 5–15)
BUN: 36 mg/dL — ABNORMAL HIGH (ref 8–23)
CO2: 26 mmol/L (ref 22–32)
Calcium: 8.5 mg/dL — ABNORMAL LOW (ref 8.9–10.3)
Chloride: 99 mmol/L (ref 98–111)
Creatinine, Ser: 1.85 mg/dL — ABNORMAL HIGH (ref 0.61–1.24)
GFR, Estimated: 40 mL/min — ABNORMAL LOW (ref >60)
Glucose, Bld: 98 mg/dL (ref 70–99)
Potassium: 4.1 mmol/L (ref 3.5–5.1)
Sodium: 131 mmol/L — ABNORMAL LOW (ref 135–145)

## 2023-05-13 LAB — BODY FLUID CULTURE W GRAM STAIN: Gram Stain: NONE SEEN

## 2023-05-13 LAB — CBC
HCT: 29.9 % — ABNORMAL LOW (ref 39.0–52.0)
Hemoglobin: 9.3 g/dL — ABNORMAL LOW (ref 13.0–17.0)
MCH: 24.4 pg — ABNORMAL LOW (ref 26.0–34.0)
MCHC: 31.1 g/dL (ref 30.0–36.0)
MCV: 78.5 fL — ABNORMAL LOW (ref 80.0–100.0)
Platelets: 104 10*3/uL — ABNORMAL LOW (ref 150–400)
RBC: 3.81 MIL/uL — ABNORMAL LOW (ref 4.22–5.81)
RDW: 18.8 % — ABNORMAL HIGH (ref 11.5–15.5)
WBC: 4.9 10*3/uL (ref 4.0–10.5)
nRBC: 0 % (ref 0.0–0.2)

## 2023-05-13 LAB — MAGNESIUM: Magnesium: 1.8 mg/dL (ref 1.7–2.4)

## 2023-05-13 LAB — GLUCOSE, CAPILLARY
Glucose-Capillary: 96 mg/dL (ref 70–99)
Glucose-Capillary: 99 mg/dL (ref 70–99)
Glucose-Capillary: 93 mg/dL (ref 70–99)
Glucose-Capillary: 113 mg/dL — ABNORMAL HIGH (ref 70–99)

## 2023-05-13 MED ORDER — SPIRONOLACTONE 25 MG PO TABS
50.0000 mg | ORAL_TABLET | Freq: Every day | ORAL | Status: DC
  Administered 2023-05-13 – 2023-05-14 (×2): 50 mg via ORAL
  Filled 2023-05-13 (×2): qty 2

## 2023-05-13 MED ORDER — FUROSEMIDE 40 MG PO TABS
40.0000 mg | ORAL_TABLET | Freq: Every day | ORAL | Status: DC
  Administered 2023-05-13 – 2023-05-14 (×2): 40 mg via ORAL
  Filled 2023-05-13 (×2): qty 1

## 2023-05-13 NOTE — Progress Notes (Signed)
 PROGRESS NOTE    KWALI WRINKLE  FMW:[id number] DOB: [birth year] DOA: 05/07/2023 PCP: Patient, No Pcp Per   Brief Narrative:  68 year old male with history of CKD stage IIIa, alcoholic cirrhosis with variceal bleeding and paracentesis many years ago, quit drinking alcohol greater than 10 years ago, ongoing cocaine abuse, noncompliance with medications including diuretics for the last several months presented with worsening abdominal and leg swelling.  On presentation, he was found to have pain new onset A-fib with RVR.  Cardiology was consulted.  He underwent paracentesis and removal of 5.2 L fluid by IR on 05/09/2023.  He underwent ultrasound-guided paracentesis again on 05/12/2023 with removal of 5 L fluid.  Cardiology signed off on 05/12/2023.  Assessment & Plan:   Newly diagnosed atrial flutter -Currently rate controlled.  Continue Coreg .  Cardiology signed off on 05/12/2023: Recommended outpatient follow-up.  Not a good candidate for anticoagulation because of cirrhosis with history of esophageal and gastric varices  Acute on chronic diastolic heart failure -Most recent echo showed EF of 55 to 60%.  Cardiology signed off on 05/12/2023: Recommended to reestablish diuretics prior to discharge and outpatient follow-up with cardiology.  Diuretics held on on 05/10/23 by cardiology due to increased creatinine.  Strict input and output.  Daily weights.  Fluid restriction.  Has diuresed well during this hospitalization.  Continue Coreg   Decompensated cirrhosis of liver with ascites and history of esophageal and gastric varices with prior history of GI bleed -Status post paracentesis and removal of 5.2 L fluid by IR on 05/09/2023.   -underwent ultrasound-guided paracentesis again on 05/12/2023 with removal of 5 L fluid. -Diuretics currently on hold.   -Consult GI  AKI on CKD stage IIIa -creatinine of 1.43 on 11/05/2022.  Creatinine improving to 1.85 this morning.  Monitor  Microcytic anemia/iron  deficiency anemia Anemia of chronic disease -Continue supplemental oral iron.  Hemoglobin stable.  Monitor intermittently  Thrombocytopenia -Possibly due to liver cirrhosis.  Platelets stable.  No signs of bleeding.  Monitor  Hypomagnesemia -Resolved  Hyponatremia -Mild.  Monitor  Cocaine abuse -counseled by prior hospitalist.  [facility] consult  History of gout -Continue allopurinol   Obesity -Outpatient follow-up  Diabetes mellitus type 2 -Blood sugars stable.  Continue CBGs with SSI  DVT prophylaxis: SCDs Code Status: Full Family Communication: None at bedside Disposition Plan: Status is: Inpatient Remains inpatient appropriate because: Of severity of illness    Consultants: Cardiology.  Consult GI  Procedures: As above  Antimicrobials: None   Subjective: Patient seen and examined at bedside.  Denies worsening shortness of breath, fever, abdominal pain.   Objective: Vitals:   05/12/23 1342 05/12/23 2117 05/13/23 0500 05/13/23 0528  BP: 96/70 95/71  100/77  Pulse: 79 84  81  Resp: 16 18    Temp: 98 F (36.7 C) 98.2 F (36.8 C)  97.9 F (36.6 C)  TempSrc:  Oral  Oral  SpO2: 97% 98%  99%  Weight:   99.7 kg   Height:        Intake/Output Summary (Last 24 hours) at 05/13/2023 0753 Last data filed at 05/13/2023 0530 Gross per 24 hour  Intake 460 ml  Output 750 ml  Net -290 ml   Filed Weights   05/11/23 0530 05/12/23 0500 05/13/23 0500  Weight: 101.7 kg 103.1 kg 99.7 kg    Examination:  General: Remains on room air.  No distress.  Chronically ill and deconditioned looking. ENT/neck: No obvious neck masses or JVD elevation noted respiratory: Decreased breath sounds at  bases bilaterally with some crackles  CVS: S1-S2 heard; currently rate controlled  abdominal: Soft, nontender, still distended; no organomegaly, normal bowel sounds heard Extremities: Lower extremity edema present bilaterally; no cyanosis  CNS: Awake and alert.  No obvious focal deficits  noted  lymph: No lymphadenopathy palpable Skin: No obvious ecchymosis/lesions psych: Mostly flat affect.  Showing no signs of agitation Musculoskeletal: No obvious joint erythema/tenderness    Data Reviewed: I have personally reviewed following labs and imaging studies  CBC: Recent Labs  Lab 05/09/23 0405 05/10/23 0450 05/11/23 0430 05/12/23 0417 05/13/23 0408  WBC 6.5 5.1 4.8 5.0 4.9  HGB 8.8* 8.7* 9.1* 9.3* 9.3*  HCT 27.8* 27.0* 29.0* 28.7* 29.9*  MCV 77.7* 77.4* 78.4* 78.0* 78.5*  PLT 100* 95* 98* 99* 104*   Basic Metabolic Panel: Recent Labs  Lab 05/09/23 0405 05/10/23 0450 05/11/23 0430 05/11/23 1712 05/12/23 0417 05/13/23 0408  NA 136 137 134* 134* 134* 131*  K 3.8 4.0 4.2 4.6 4.5 4.1  CL 103 102 100 99 101 99  CO2 24 25 26 27 26 26   GLUCOSE 106* 87 96 87 92 98  BUN 33* 39* 40* 37* 38* 36*  CREATININE 2.16* 2.46* 2.08* 2.12* 1.88* 1.85*  CALCIUM  8.9 8.8* 8.7* 8.9 8.9 8.5*  MG 2.1 1.9 1.9  --  2.0 1.8  PHOS 2.8 2.9 2.6  --   --   --    GFR: Estimated Creatinine Clearance: 44.9 mL/min (A) (by C-G formula based on SCr of 1.85 mg/dL (H)). Liver Function Tests: Recent Labs  Lab 05/08/23 0624 05/10/23 0450 05/11/23 0430 05/12/23 0417  AST 16 13* 12* 15  ALT 9 6 6 7   ALKPHOS 50 53 44 45  BILITOT 1.6* 1.0 1.0 0.9  PROT 7.8 6.8 6.5 7.0  ALBUMIN  3.5 3.5 3.4* 3.6   No results for input(s): LIPASE, AMYLASE in the last 168 hours. Recent Labs  Lab 05/10/23 0450  AMMONIA 20   Coagulation Profile: Recent Labs  Lab 05/08/23 1229  INR 1.6*   Cardiac Enzymes: No results for input(s): CKTOTAL, CKMB, CKMBINDEX, TROPONINI in the last 168 hours. BNP (last 3 results) Recent Labs    11/05/22 1358 11/28/22 0849  PROBNP 3,248* 4,858*   HbA1C: No results for input(s): HGBA1C in the last 72 hours.  CBG: Recent Labs  Lab 05/12/23 0722 05/12/23 1201 05/12/23 1642 05/12/23 2113 05/13/23 0725  GLUCAP 103* 85 79 139* 96   Lipid  Profile: No results for input(s): CHOL, HDL, LDLCALC, TRIG, CHOLHDL, LDLDIRECT in the last 72 hours. Thyroid Function Tests: Recent Labs    05/12/23 1308  TSH 3.089   Anemia Panel: No results for input(s): VITAMINB12, FOLATE, FERRITIN, TIBC, IRON, RETICCTPCT in the last 72 hours.  Sepsis Labs: Recent Labs  Lab 05/08/23 1229  LATICACIDVEN 2.0*    Recent Results (from the past 240 hours)  SARS Coronavirus 2 by RT PCR (hospital order, performed in [facility] hospital lab) *cepheid single result test* Anterior Nasal Swab     Status: None   Collection Time: 05/08/23 12:01 AM   Specimen: Anterior Nasal Swab  Result Value Ref Range Status   SARS Coronavirus 2 by RT PCR NEGATIVE NEGATIVE Final    Comment: (NOTE) SARS-CoV-2 target nucleic acids are NOT DETECTED.  The SARS-CoV-2 RNA is generally detectable in upper and lower respiratory specimens during the acute phase of infection. The lowest concentration of SARS-CoV-2 viral copies this assay can detect is 250 copies / mL. A negative result does  not preclude SARS-CoV-2 infection and should not be used as the sole basis for treatment or other patient management decisions.  A negative result may occur with improper specimen collection / handling, submission of specimen other than nasopharyngeal swab, presence of viral mutation(s) within the areas targeted by this assay, and inadequate number of viral copies (<250 copies / mL). A negative result must be combined with clinical observations, patient history, and epidemiological information.  Fact Sheet for Patients:   roadlaptop.co.za  Fact Sheet for Healthcare Providers: http://kim-miller.com/  This test is not yet approved or  cleared by the [place]  FDA and has been authorized for detection and/or diagnosis of SARS-CoV-2 by FDA under an Emergency Use Authorization (EUA).  This EUA will remain in effect  (meaning this test can be used) for the duration of the COVID-19 declaration under Section 564(b)(1) of the Act, 21 U.S.C. section 360bbb-3(b)(1), unless the authorization is terminated or revoked sooner.  Performed at [facility], [address] [place], [place] [address]   Body fluid culture w Gram Stain     Status: None (Preliminary result)   Collection Time: 05/09/23  1:38 PM   Specimen: PATH Cytology Peritoneal fluid  Result Value Ref Range Status   Specimen Description   Final    PERITONEAL Performed at [facility], [address] [place], [place] [address]    Special Requests   Final    NONE Performed at [facility], [address], [place], [place] [address]    Gram Stain NO WBC SEEN NO ORGANISMS SEEN   Final   Culture   Final    NO GROWTH 3 DAYS Performed at [facility], [address] [place], [place] [address]    Report Status PENDING  Incomplete         Radiology Studies: US  Paracentesis Result Date: 05/12/2023 INDICATION: Patient is a 68 y/o male with history of CHF, cirrhosis, CKD, substance abuse, gastric and esophageal varices. Patient presents for a therapeutic paracentesis due to recurrent ascites. EXAM: ULTRASOUND GUIDED right mid to lower sided PARACENTESIS MEDICATIONS: 10mL lidocaine  1%. COMPLICATIONS: None immediate. PROCEDURE: Informed written consent was obtained from the patient after a discussion of the risks, benefits and alternatives to treatment. A timeout was performed prior to the initiation of the procedure. Initial ultrasound scanning demonstrates a large amount of ascites within the right mid to lower abdominal quadrant. The right mid to lower abdomen was prepped and draped in the usual sterile fashion. 1% lidocaine  was used for local anesthesia. Following this, a 19 gauge, 10-cm, Yueh catheter was introduced. An ultrasound image was saved for documentation purposes. The  paracentesis was performed. The catheter was removed and a dressing was applied. The patient tolerated the procedure well without immediate post procedural complication. SABRA FINDINGS: A total of approximately 5L of amber fluid was removed. IMPRESSION: Successful ultrasound-guided paracentesis yielding 5L liters of peritoneal fluid. Performed by Daved Hipp PA-C PLAN: If the patient eventually requires >/=2 paracenteses in a 30 day period, candidacy for formal evaluation by the [facility] will be assessed. Electronically Signed   By: CHRISTELLA.  Shick M.D.   On: 05/12/2023 12:11        Scheduled Meds:  allopurinol   100 mg Oral Daily   ferrous sulfate   325 mg Oral Q M,W,F   folic acid   1 mg Oral Daily   insulin  aspart  0-6 Units Subcutaneous TID WC   lidocaine   1 patch Transdermal Q24H   melatonin  5 mg Oral QHS   pantoprazole   40 mg Oral Daily   thiamine   100 mg Oral Daily   Continuous Infusions:          Sophie Mao, MD [facility] 05/13/2023, 7:53 AM

## 2023-05-13 NOTE — Plan of Care (Signed)
  Problem: Education: Goal: Knowledge of General Education information will improve Description: Including pain rating scale, medication(s)/side effects and non-pharmacologic comfort measures Outcome: Progressing   Problem: Clinical Measurements: Goal: Diagnostic test results will improve Outcome: Progressing   Problem: Activity: Goal: Risk for activity intolerance will decrease Outcome: Progressing   Problem: Pain Management: Goal: General experience of comfort will improve Outcome: Progressing

## 2023-05-13 NOTE — Consult Note (Signed)
 Consultation  Referring Provider:   Dr. Cheryle Primary Care Physician:  Patient, No Pcp Per Primary Gastroenterologist:    Dr. Aneita     Reason for Consultation: Decompensated cirrhosis             HPI:   John [name] is a 68 y.o. male with a past medical history as CKD and alcoholic cirrhosis with variceal bleeding and paracentesis many years ago, who presented to the hospital on 05/07/2023 with fluid retention.    At time of admission patient discussed that he had quit drinking alcohol greater than 10 years ago and continued to have a cocaine habit was compliant with his medications when he had them.  Reported being out of a diuretic for several months.  Swelling had been worse over the past week and got to the point where is painful in his legs and abdomen.    Today, at time of my interview patient discusses that he was doing fairly well at home taking his diuretics on a regular basis, but about 2 months ago he ran out of his medications and then over the past 2 weeks had noticed an increase in fluid buildup in his legs and abdomen as well as some increased trouble breathing.  Tells me he has continued to use cocaine though it has slowed some, his last use was 2 weeks ago per him.  After his second paracentesis yesterday he does feel better now, his stomach is soft.  Bowel movements normal.  No abdominal pain.     Denies fever, chills, weight loss or blood in his stool.  ED course: EKG with new onset A-fib and flutter, volume overload  Hospital course:  -Ultrasound paracentesis 05/09/2023 with 5.2 L of hazy, amber fluid removed -Echo 05/10/2023 done for A-fib with an LVEF 55-60% with mild left ventricular hypertrophy and aortic dilatation -Cardiology signed off 05/12/2023 recommended starting Lasix  and Aldactone  for cirrhotic ascites and hold Carvedilol  -Ultrasound-guided paracentesis 05/12/2023 with 5 L of fluid removed  GI history: 12/26/2021 office visit with Dr. Aneita for  follow-up of decompensated cirrhosis: At that time had been in the ED for visit with lower extremity edema on 7/18, CT showed bilateral flank edema with perirectal edema and small bilateral pleural effusions; plan: At that time diagnosed decompensated cirrhosis secondary to hepatitis C and alcohol portal gastropathy, esophageal varices and bleeding gastric varices treated with BRTO and EGD, also CHF with volume overload, GERD with LA grade a esophagitis and an esophageal stricture and a personal history of tubular adenoma and serrated adenoma in November 2019 11/13/2021 CTAP with cirrhosis and portal hypertension manifesting as splenomegaly and distal esophageal varices, increased bilateral flank edema, stable small umbilical hernia containing small bowel and aortic atherosclerosis 06/27/2020 EGD with grade 1 esophageal varices, benign-appearing esophageal stenosis, LA grade a reflux esophagitis, portal hypertensive gastropathy, gastric varices without bleeding a small hiatal hernia-recommended repeat EGD in 1 to 2 years for surveillance 03/02/2018 colonoscopy with one 5 mm polyp in transverse colon, four 7-8 mm polyps in the sigmoid colon, descending colon and transverse colon, lipoma in the transverse colon internal hemorrhoids-pathology showed tubular adenoma and serrated adenoma-repeat recommended in 3 years  Past Medical History:  Diagnosis Date   Allergy    Arthritis    oa back   Asthma    Cataract    both eyes   CHF (congestive heart failure) (HCC) 2001   sees primary for   Chronic back pain    Chronic kidney  disease    ckd 3   Cirrhosis (HCC)    alcoholic with h/o varices, ascites   Coronary artery disease    nonobstrucrtibe   ETOH abuse    GERD (gastroesophageal reflux disease)    Gout    Hepatitis    hepatitic c 2018 24 week tx with ecuplipsa, no hepatitic c detected after tx   History of blood transfusion 8-9- yrs ago   Hypertension    Optic neuropathy, left    PAD (peripheral  artery disease) (HCC)    slight  primary manages   Polysubstance abuse (HCC)    Renal cell carcinoma (HCC) 2016, 2018 and 2022   left ablation   Seizures [facility])    age 31 none since   Sickle cell trait (HCC)    Stroke (HCC) 1998   light no problems with   Uses walker 12/04/2020   Wears dentures    Wears glasses     Past Surgical History:  Procedure Laterality Date   ARTHRODESIS METATARSALPHALANGEAL JOINT (MTPJ) Right 12/07/2020   Procedure: ARTHRODESIS METATARSALPHALANGEAL JOINT (MTPJ);  Surgeon: Janit Thresa HERO, DPM;  Location: [facility];  Service: Podiatry;  Laterality: Right;   COLONOSCOPY  2021   ESOPHAGOGASTRODUODENOSCOPY N/A 08/14/2014   Procedure: ESOPHAGOGASTRODUODENOSCOPY (EGD);  Surgeon: Gwendlyn ONEIDA Buddy, MD;  Location: [facility];  Service: Endoscopy;  Laterality: N/A;   ESOPHAGOGASTRODUODENOSCOPY (EGD) WITH PROPOFOL  N/A 06/03/2017   Procedure: ESOPHAGOGASTRODUODENOSCOPY (EGD) WITH PROPOFOL ;  Surgeon: Buddy Gwendlyn ONEIDA, MD;  Location: [facility];  Service: Endoscopy;  Laterality: N/A;   HALLUX FUSION Left 07/28/2020   Procedure: HALLUX FUSION MPJ;  Surgeon: Janit Thresa HERO, DPM;  Location: [facility] [place];  Service: Podiatry;  Laterality: Left;   HAMMER TOE SURGERY Left 07/28/2020   Procedure: HAMMER TOE CORRECTION  2,3,AND4 LEFT FOOT;  Surgeon: Janit Thresa HERO, DPM;  Location: [facility];  Service: Podiatry;  Laterality: Left;   HAMMER TOE SURGERY Right 12/07/2020   Procedure: HAMMER TOE CORRECTION  2-4;  Surgeon: Janit Thresa HERO, DPM;  Location: [facility];  Service: Podiatry;  Laterality: Right;   IR RADIOLOGIST EVAL & MGMT  09/25/2016   IR RADIOLOGIST EVAL & MGMT  12/10/2016   IR RADIOLOGIST EVAL & MGMT  03/25/2018   IR RADIOLOGIST EVAL & MGMT  03/24/2019   IR RADIOLOGIST EVAL & MGMT  05/17/2020   IR RADIOLOGIST EVAL & MGMT  08/03/2020   IR RADIOLOGIST EVAL & MGMT  11/22/2020   IR RADIOLOGIST EVAL & MGMT   05/31/2021   METATARSAL HEAD EXCISION Left 07/28/2020   Procedure: METATARSAL HEAD EXCISION TOES 2,3,AND 4  LEFT FOOT;  Surgeon: Janit Thresa HERO, DPM;  Location: [facility];  Service: Podiatry;  Laterality: Left;   METATARSAL OSTEOTOMY Right 12/07/2020   Procedure: METATARSAL OSTEOTOMY TOES 2-4 RIGHT FOOT;  Surgeon: Janit Thresa HERO, DPM;  Location: [facility];  Service: Podiatry;  Laterality: Right;   none     RADIOFREQUENCY ABLATION Left 11/08/2016   Procedure: LEFT RENAL CRYOABLATION;  Surgeon: Vanice Sharper, MD;  Location: [facility];  Service: Anesthesiology;  Laterality: Left;   RADIOLOGY WITH ANESTHESIA N/A 08/15/2014   Procedure: RADIOLOGY WITH ANESTHESIA;  Surgeon: Sharper Vanice, MD;  Location: [facility];  Service: Radiology;  Laterality: N/A;   RADIOLOGY WITH ANESTHESIA Left 07/05/2020   Procedure: CT CRYOABLATION;  Surgeon: Vanice Sharper, MD;  Location: [facility];  Service: Anesthesiology;  Laterality: Left;   UPPER GASTROINTESTINAL ENDOSCOPY  Family History  Problem Relation Age of Onset   Hypertension Mother        Living   Kidney disease Mother    Diabetes Mother    Heart disease Mother    Hypertension Father        Deceased, 63   Ulcers Father    Stomach cancer Father    Hypertension Brother    Diabetes Brother    Kidney disease Brother    Hypertension Sister    Colon cancer Neg Hx    Esophageal cancer Neg Hx    Rectal cancer Neg Hx     Social History   Tobacco Use   Smoking status: Former    Current packs/day: 0.00    Average packs/day: 0.3 packs/day for 20.0 years (6.0 ttl pk-yrs)    Types: Cigarettes    Start date: 04/30/1991    Quit date: 04/30/2011    Years since quitting: 12.0   Smokeless tobacco: Never   Tobacco comments:    2015  Vaping Use   Vaping status: Never Used  Substance Use Topics   Alcohol use: No    Comment: stopped 11 years ago   Drug use: Yes    Types: Cocaine, Marijuana    Comment: cocaine last used 2024 and  marijuan last used 2024    Prior to Admission medications   Medication Sig Start Date End Date Taking? Authorizing Provider  acetaminophen  (TYLENOL ) 325 MG tablet Take 650 mg by mouth every 8 (eight) hours as needed for moderate pain (NOT TO EXCEED 2,000 MG/DAY).   Yes [provider]  allopurinol  (ZYLOPRIM ) 100 MG tablet Take 1 tablet (100 mg total) by mouth daily. 05/12/18  Yes Sebastian Toribio GAILS, MD  atorvastatin  (LIPITOR) 40 MG tablet Take 40 mg by mouth daily. 04/29/19  Yes [provider]  brimonidine  (ALPHAGAN ) 0.15 % ophthalmic solution Place 1 drop into both eyes at bedtime.   Yes [provider]  carvedilol  (COREG ) 3.125 MG tablet Take 1 tablet (3.125 mg total) by mouth 2 (two) times daily with a meal. 08/01/22  Yes Lue Elsie BROCKS, MD  ferrous sulfate  325 (65 FE) MG EC tablet Take 325 mg by mouth every Monday, Wednesday, and Friday.   Yes [provider]  FLOVENT  HFA 220 MCG/ACT inhaler Inhale 2 puffs into the lungs in the morning and at bedtime. 01/27/19  Yes [provider]  furosemide  (LASIX ) 20 MG tablet Take 3 tablets (60 mg total) by mouth 2 (two) times daily. 11/05/22  Yes Hobart Powell BRAVO, MD  ketorolac  (ACULAR ) 0.5 % ophthalmic solution Place 1 drop into the right eye 4 (four) times daily. 03/20/23  Yes [provider]  lidocaine  (LIDODERM ) 5 % Place 1 patch onto the skin daily. Remove & Discard patch within 12 hours or as directed by MD 11/08/22  Yes Randol Simmonds, MD  melatonin 5 MG TABS Take 5 mg by mouth at bedtime.   Yes [provider]  Menthol -Methyl Salicylate  (SALONPAS  PAIN RELIEF  PATCH) PTCH Apply 1 patch topically daily as needed (for pain- affected area).   Yes [provider]  ofloxacin (OCUFLOX) 0.3 % ophthalmic solution Place 1 drop into the right eye 4 (four) times daily. 03/20/23  Yes [provider]  omeprazole  (PRILOSEC) 40 MG capsule TAKE ONE CAPSULE BY MOUTH DAILY Patient taking  differently: Take 40 mg by mouth daily before breakfast. 07/02/18  Yes Aneita Gwendlyn DASEN, MD  potassium chloride  (KLOR-CON ) 10 MEQ tablet Take 10 mEq by mouth  daily.   Yes [provider]  prednisoLONE acetate (PRED FORTE) 1 % ophthalmic suspension Place 1 drop into the right eye 4 (four) times daily. 03/20/23  Yes [provider]  PROAIR  HFA 108 (90 Base) MCG/ACT inhaler Inhale 2 puffs into the lungs every 6 (six) hours as needed for wheezing or shortness of breath. 10/02/17  Yes [provider]  spironolactone  (ALDACTONE ) 50 MG tablet Take 1 tablet (50 mg total) by mouth daily. 08/01/22  Yes Lue Elsie BROCKS, MD  terbinafine (LAMISIL) 1 % cream Apply 1 Application topically See admin instructions. Apply to feet as directed every other day   Yes [provider]  empagliflozin  (JARDIANCE ) 10 MG TABS tablet Take 1 tablet (10 mg total) by mouth daily before breakfast. 11/05/22   Hobart Powell BRAVO, MD    Current Facility-Administered Medications  Medication Dose Route Frequency Provider Last Rate Last Admin   allopurinol  (ZYLOPRIM ) tablet 100 mg  100 mg Oral Daily Claiborne, Claudia, MD   100 mg at 05/12/23 9086   ferrous sulfate  tablet 325 mg  325 mg Oral Q M,W,F Arthea Child, MD   325 mg at 05/12/23 0913   folic acid  (FOLVITE ) tablet 1 mg  1 mg Oral Daily Claiborne, Claudia, MD   1 mg at 05/12/23 9086   insulin  aspart (novoLOG ) injection 0-6 Units  0-6 Units Subcutaneous TID WC Arthea Child, MD       lidocaine  (LIDODERM ) 5 % 1 patch  1 patch Transdermal Q24H Leotis Bogus, MD   1 patch at 05/12/23 0747   magnesium  hydroxide (MILK OF MAGNESIA) suspension 30 mL  30 mL Oral Daily PRN Arthea Child, MD       melatonin tablet 5 mg  5 mg Oral QHS Arthea Child, MD   5 mg at 05/12/23 2126   ondansetron  (ZOFRAN ) tablet 4 mg  4 mg Oral Q6H PRN Arthea Child, MD       Or   ondansetron  (ZOFRAN ) injection 4 mg  4 mg Intravenous Q6H PRN Arthea Child, MD       oxyCODONE  (Oxy IR/ROXICODONE ) immediate release tablet 5 mg  5 mg Oral Q6H PRN Cheryle Page, MD   5 mg at 05/12/23 2126   pantoprazole  (PROTONIX ) EC tablet 40 mg  40 mg Oral Daily Claiborne, Claudia, MD   40 mg at 05/12/23 0913   thiamine  (VITAMIN B1) tablet 100 mg  100 mg Oral Daily Claiborne, Claudia, MD   100 mg at 05/12/23 0913    Allergies as of 05/07/2023 - Review Complete 05/07/2023  Allergen Reaction Noted   Penicillins Other (See Comments) 12/16/2012     Review of Systems:    Constitutional: No weight loss, fever or chills Skin: No rash  Cardiovascular: No chest pain   Respiratory: No SOB  Gastrointestinal: See HPI and otherwise negative Genitourinary: No dysuria  Neurological: No headache, dizziness or syncope Musculoskeletal: No new muscle or joint pain Hematologic: No bleeding Psychiatric: No history of depression or anxiety    Physical Exam:  Vital signs in last 24 hours: Temp:  [97.9 F (36.6 C)-98.2 F (36.8 C)] 97.9 F (36.6 C) (01/14 0529) Pulse Rate:  [79-84] 81 (01/14 0529) Resp:  [16-18] 18 (01/14 0529) BP: (94-116)/(69-88) 100/77 (01/14 0529) SpO2:  [97 %-99 %] 99 % (01/14 0529) Weight:  [99.7 kg] 99.7 kg (01/14 0500) Last BM Date : 05/11/23 General:   Pleasant chronically ill appearing AA male appears to be in NAD, Well developed, alert and cooperative Head:  Normocephalic and atraumatic. Eyes:   PEERL, EOMI. No icterus. Conjunctiva pink. Ears:  Normal auditory acuity. Neck:  Supple Throat: Oral cavity and pharynx without inflammation, swelling or lesion. Teeth in good condition. Lungs: Respirations even and unlabored. Lungs clear to auscultation bilaterally.   No wheezes, crackles, or rhonchi.  Heart: Normal S1, S2. No MRG. Regular rate and rhythm. +B/l LE edema Abdomen:  Soft, nondistended, nontender. No rebound or guarding. Normal bowel sounds. No appreciable masses or hepatomegaly. Rectal:  Not performed.  Msk:  Symmetrical  without gross deformities. Peripheral pulses intact.  Extremities:  Without edema, no deformity or joint abnormality. Normal ROM, normal sensation. Neurologic:  Alert and  oriented x4;  grossly normal neurologically. Skin:   Dry and intact without significant lesions or rashes. Psychiatric: Demonstrates good judgement and reason without abnormal affect or behaviors.  LAB RESULTS: Recent Labs    05/11/23 0430 05/12/23 0417 05/13/23 0408  WBC 4.8 5.0 4.9  HGB 9.1* 9.3* 9.3*  HCT 29.0* 28.7* 29.9*  PLT 98* 99* 104*   BMET Recent Labs    05/11/23 1712 05/12/23 0417 05/13/23 0408  NA 134* 134* 131*  K 4.6 4.5 4.1  CL 99 101 99  CO2 27 26 26   GLUCOSE 87 92 98  BUN 37* 38* 36*  CREATININE 2.12* 1.88* 1.85*  CALCIUM  8.9 8.9 8.5*      Latest Ref Rng & Units 05/12/2023    4:17 AM 05/11/2023    4:30 AM 05/10/2023    4:50 AM  Hepatic Function  Total Protein 6.5 - 8.1 g/dL 7.0  6.5  6.8   Albumin  3.5 - 5.0 g/dL 3.6  3.4  3.5   AST 15 - 41 U/L 15  12  13    ALT 0 - 44 U/L 7  6  6    Alk Phosphatase 38 - 126 U/L 45  44  53   Total Bilirubin 0.0 - 1.2 mg/dL 0.9  1.0  1.0   Bilirubin, Direct 0.0 - 0.2 mg/dL 0.3  0.2  0.3      PT/INR 19.1/1.6 on 05/08/2023  STUDIES: US  Paracentesis Result Date: 05/12/2023 INDICATION: Patient is a 68 y/o male with history of CHF, cirrhosis, CKD, substance abuse, gastric and esophageal varices. Patient presents for a therapeutic paracentesis due to recurrent ascites. EXAM: ULTRASOUND GUIDED right mid to lower sided PARACENTESIS MEDICATIONS: 10mL lidocaine  1%. COMPLICATIONS: None immediate. PROCEDURE: Informed written consent was obtained from the patient after a discussion of the risks, benefits and alternatives to treatment. A timeout was performed prior to the initiation of the procedure. Initial ultrasound scanning demonstrates a large amount of ascites within the right mid to lower abdominal quadrant. The right mid to lower abdomen was prepped and draped  in the usual sterile fashion. 1% lidocaine  was used for local anesthesia. Following this, a 19 gauge, 10-cm, Yueh catheter was introduced. An ultrasound image was saved for documentation purposes. The paracentesis was performed. The catheter was removed and a dressing was applied. The patient tolerated the procedure well without immediate post procedural complication. SABRA FINDINGS: A total of approximately 5L of amber fluid was removed. IMPRESSION: Successful ultrasound-guided paracentesis yielding 5L liters of peritoneal fluid. Performed by Daved Hipp PA-C PLAN: If the patient eventually requires >/=2 paracenteses in a 30 day period, candidacy for formal evaluation by the [facility] will be assessed. Electronically Signed   By: CHRISTELLA.  Shick M.D.   On: 05/12/2023 12:11    Impression / Plan:  Impression: 1.  A-fib/flutter: New onset, potentially related to cocaine use 2.  History of decompensated cirrhosis: Related to alcohol abuse and hepatitis C, now with volume overload after being out of diuretics, paracentesis x 2 this admission with 5 L removed both times, body fluid culture negative, cell count negative for SBP, albumin  2.0, likely fluid buildup due to running out of diuretics  MELD 3.0: 20 at 05/10/2023  4:50 AM MELD-Na: 20 at 05/10/2023  4:50 AM Calculated from: Serum Creatinine: 2.46 mg/dL at 8/88/7974  5:49 AM Serum Sodium: 137 mmol/L at 05/10/2023  4:50 AM Total Bilirubin: 1 mg/dL at 8/88/7974  5:49 AM Serum Albumin : 3.5 g/dL at 8/88/7974  5:49 AM INR(ratio): 1.6 at 05/08/2023 12:29 PM Age at listing (hypothetical): 11 years Sex: Male at 05/10/2023  4:50 AM  3.  Diabetes type 2 4.  Acute on chronic diastolic heart failure 5.  Microcytic anemia/iron deficiency anemia: Thought to be related to anemia of chronic disease, hemoglobin stable 6.  Thrombocytopenia: Likely due to cirrhosis 7.  Cocaine abuse: Last used 2 weeks ago per the  patient  Plan: 1.  Patient is overdue for colonoscopy and EGD at this point.  Could consider pursuing EGD here for variceal surveillance given decompensation. Colonoscopy likely outpatient for screening. 2.  Started the patient back on Spironolactone  50 mg daily and Lasix  40 mg daily. 3.  Continue to monitor CBC/CMP daily 4.  Continue current diet  Thank you for your kind consultation, we will continue to follow.  Delon Gibson [facility]  05/13/2023, 9:23 AM

## 2023-05-13 NOTE — Progress Notes (Signed)
 Physical Therapy Treatment Patient Details Name: John [name] MRN: [id number] DOB: [birth year] Today's Date: 05/13/2023   History of Present Illness 68 year old male with history of CKD stage IIIa, alcoholic cirrhosis with variceal bleeding and paracentesis many years ago, quit drinking alcohol greater than 10 years ago, ongoing cocaine abuse, noncompliance with medications including diuretics for the last several months presented with worsening abdominal and leg swelling.  On presentation, he was found to have pain new onset A-fib with RVR.    PT Comments  The patient ambulated x 130' with RW.  Patient hopes to Dc soon. No recommendations.   If plan is discharge home, recommend the following: Assistance with cooking/housework   Can travel by private vehicle        Equipment Recommendations  None recommended by PT    Recommendations for Other Services       Precautions / Restrictions Precautions Precautions: Fall Restrictions Weight Bearing Restrictions Per Provider Order: No     Mobility  Bed Mobility Overal bed mobility: Modified Independent                  Transfers   Equipment used: Rolling walker (2 wheels) Transfers: Sit to/from Stand Sit to Stand: Supervision                Ambulation/Gait Ambulation/Gait assistance: Contact guard assist Gait Distance (Feet): 130 Feet Assistive device: Rolling walker (2 wheels) Gait Pattern/deviations: Step-through pattern, Decreased step length - right, Decreased step length - left       General Gait Details: Slow cadence and when cued could easily increase to what he reports is his normal gait speed, but then slowed down.  Seems very stiff overall with gait.   Stairs             Wheelchair Mobility     Tilt Bed    Modified Rankin (Stroke Patients Only)       Balance Overall balance assessment: Mild deficits observed, not formally tested                                           Cognition Arousal: Alert Behavior During Therapy: WFL for tasks assessed/performed Overall Cognitive Status: Within Functional Limits for tasks assessed                                          Exercises      General Comments        Pertinent Vitals/Pain Pain Assessment Pain Assessment: No/denies pain    Home Living                          Prior Function            PT Goals (current goals can now be found in the care plan section) Progress towards PT goals: Progressing toward goals    Frequency    Min 1X/week      PT Plan      Co-evaluation              AM-PAC PT 6 Clicks Mobility   Outcome Measure  Help needed turning from your back to your side while in a flat bed without using bedrails?: None Help needed moving from lying on your back  to sitting on the side of a flat bed without using bedrails?: None Help needed moving to and from a bed to a chair (including a wheelchair)?: None Help needed standing up from a chair using your arms (e.g., wheelchair or bedside chair)?: A Little Help needed to walk in hospital room?: A Little Help needed climbing 3-5 steps with a railing? : A Little 6 Click Score: 21    End of Session Equipment Utilized During Treatment: Gait belt Activity Tolerance: Patient tolerated treatment well Patient left: in bed;with call bell/phone within reach;with bed alarm set Nurse Communication: Mobility status PT Visit Diagnosis: Difficulty in walking, not elsewhere classified (R26.2);Muscle weakness (generalized) (M62.81)     Time: 8852-8797 PT Time Calculation (min) (ACUTE ONLY): 15 min  Charges:    $Gait Training: 8-22 mins PT General Charges $$ ACUTE PT VISIT: 1 Visit                     Darice Potters PT Acute Rehabilitation Services Office [phone] Weekend [phone]    Potters Darice Norris 05/13/2023, 2:19 PM

## 2023-05-14 ENCOUNTER — Telehealth: Payer: Self-pay

## 2023-05-14 ENCOUNTER — Encounter (HOSPITAL_COMMUNITY): Payer: Self-pay | Admitting: *Deleted

## 2023-05-14 ENCOUNTER — Other Ambulatory Visit (HOSPITAL_COMMUNITY): Payer: Self-pay

## 2023-05-14 DIAGNOSIS — K7290 Hepatic failure, unspecified without coma: Secondary | ICD-10-CM

## 2023-05-14 DIAGNOSIS — Z8619 Personal history of other infectious and parasitic diseases: Secondary | ICD-10-CM

## 2023-05-14 DIAGNOSIS — K766 Portal hypertension: Secondary | ICD-10-CM

## 2023-05-14 DIAGNOSIS — I509 Heart failure, unspecified: Secondary | ICD-10-CM | POA: Diagnosis not present

## 2023-05-14 DIAGNOSIS — K7030 Alcoholic cirrhosis of liver without ascites: Secondary | ICD-10-CM | POA: Diagnosis not present

## 2023-05-14 DIAGNOSIS — K7031 Alcoholic cirrhosis of liver with ascites: Secondary | ICD-10-CM

## 2023-05-14 DIAGNOSIS — D509 Iron deficiency anemia, unspecified: Secondary | ICD-10-CM | POA: Diagnosis not present

## 2023-05-14 DIAGNOSIS — E119 Type 2 diabetes mellitus without complications: Secondary | ICD-10-CM | POA: Diagnosis not present

## 2023-05-14 DIAGNOSIS — I4891 Unspecified atrial fibrillation: Secondary | ICD-10-CM | POA: Diagnosis not present

## 2023-05-14 LAB — BASIC METABOLIC PANEL
Anion gap: 9 (ref 5–15)
BUN: 37 mg/dL — ABNORMAL HIGH (ref 8–23)
CO2: 22 mmol/L (ref 22–32)
Calcium: 8.7 mg/dL — ABNORMAL LOW (ref 8.9–10.3)
Chloride: 104 mmol/L (ref 98–111)
Creatinine, Ser: 1.76 mg/dL — ABNORMAL HIGH (ref 0.61–1.24)
GFR, Estimated: 42 mL/min — ABNORMAL LOW (ref >60)
Glucose, Bld: 92 mg/dL (ref 70–99)
Potassium: 4.5 mmol/L (ref 3.5–5.1)
Sodium: 135 mmol/L (ref 135–145)

## 2023-05-14 LAB — CBC
HCT: 28.9 % — ABNORMAL LOW (ref 39.0–52.0)
Hemoglobin: 9.2 g/dL — ABNORMAL LOW (ref 13.0–17.0)
MCH: 24.7 pg — ABNORMAL LOW (ref 26.0–34.0)
MCHC: 31.8 g/dL (ref 30.0–36.0)
MCV: 77.7 fL — ABNORMAL LOW (ref 80.0–100.0)
Platelets: 97 10*3/uL — ABNORMAL LOW (ref 150–400)
RBC: 3.72 MIL/uL — ABNORMAL LOW (ref 4.22–5.81)
RDW: 19 % — ABNORMAL HIGH (ref 11.5–15.5)
WBC: 5.3 10*3/uL (ref 4.0–10.5)
nRBC: 0 % (ref 0.0–0.2)

## 2023-05-14 LAB — GLUCOSE, CAPILLARY
Glucose-Capillary: 84 mg/dL (ref 70–99)
Glucose-Capillary: 86 mg/dL (ref 70–99)

## 2023-05-14 LAB — MAGNESIUM: Magnesium: 2 mg/dL (ref 1.7–2.4)

## 2023-05-14 MED ORDER — VITAMIN B-1 100 MG PO TABS
100.0000 mg | ORAL_TABLET | Freq: Every day | ORAL | 0 refills | Status: DC
  Filled 2023-05-14: qty 30, 30d supply, fill #0

## 2023-05-14 MED ORDER — FUROSEMIDE 40 MG PO TABS
40.0000 mg | ORAL_TABLET | Freq: Every day | ORAL | 3 refills | Status: DC
  Filled 2023-05-14: qty 30, 30d supply, fill #0

## 2023-05-14 MED ORDER — SPIRONOLACTONE 50 MG PO TABS
50.0000 mg | ORAL_TABLET | Freq: Every day | ORAL | 3 refills | Status: DC
  Filled 2023-05-14: qty 30, 30d supply, fill #0

## 2023-05-14 MED ORDER — FOLIC ACID 1 MG PO TABS
1.0000 mg | ORAL_TABLET | Freq: Every day | ORAL | 0 refills | Status: DC
  Filled 2023-05-14: qty 30, 30d supply, fill #0

## 2023-05-14 NOTE — Plan of Care (Signed)
  Problem: Pain Management: Goal: General experience of comfort will improve Outcome: Progressing   Problem: Safety: Goal: Ability to remain free from injury will improve Outcome: Progressing   Problem: Skin Integrity: Goal: Risk for impaired skin integrity will decrease Outcome: Progressing

## 2023-05-14 NOTE — Progress Notes (Addendum)
 PROGRESS NOTE    RISHAWN MCEWAN  ZOX:[id number] DOB: [birth year] DOA: 05/07/2023 PCP: Patient, No Pcp Per   Brief Narrative:  68 year old male with history of CKD stage IIIa, alcoholic cirrhosis with variceal bleeding and paracentesis many years ago, quit drinking alcohol greater than 10 years ago, ongoing cocaine abuse, noncompliance with medications including diuretics for the last several months presented with worsening abdominal and leg swelling.  On presentation, he was found to have pain new onset A-fib with RVR.  Cardiology was consulted.  He underwent paracentesis and removal of 5.2 L fluid by IR on 05/09/2023.  He underwent ultrasound-guided paracentesis again on 05/12/2023 with removal of 5 L fluid.  Cardiology signed off on 05/12/2023.  Assessment & Plan:   Newly diagnosed atrial flutter -Currently rate controlled.   Carvedilol  discontinued by cardiology due to hypotension. Cardiology signed off on 05/12/2023: Recommended outpatient follow-up.  Not a good candidate for anticoagulation because of cirrhosis with history of esophageal and gastric varices  Acute on chronic diastolic heart failure -Most recent echo showed EF of 55 to 60%.   Cardiology signed off on 05/12/2023: Recommended to reestablish diuretics prior to discharge and outpatient follow-up with cardiology.   Currently on 40 mg of furosemide  once a day. Strict input and output.  Daily weights.  Fluid restriction.    Decompensated cirrhosis of liver with ascites and history of esophageal and gastric varices with prior history of GI bleed -Status post paracentesis and removal of 5.2 L fluid by IR on 05/09/2023.   -underwent ultrasound-guided paracentesis again on 05/12/2023 with removal of 5 L fluid. Currently on furosemide  and spironolactone . Seen by gastroenterology. Seems to be stable currently with stable hemoglobin.  Waiting to see if they plan to do any further testing while he is hospitalized.  AKI on CKD stage  IIIa -creatinine of 1.43 on 11/05/2022.   Creatinine was noted to be 1.85 yesterday and 1.76 today.  Microcytic anemia/iron deficiency anemia Anemia of chronic disease -Continue supplemental oral iron.  Hemoglobin stable.  Monitor intermittently  Thrombocytopenia -Possibly due to liver cirrhosis.  Platelets stable.  No signs of bleeding.  Monitor  Hypomagnesemia -Resolved  Hyponatremia -Mild.  Monitor  Cocaine abuse -counseled by prior hospitalist.  [facility] consult  History of gout -Continue allopurinol   Diabetes mellitus type 2 -Blood sugars stable.  Continue CBGs with SSI  Obesity Estimated body mass index is 32.88 kg/m as calculated from the following:   Height as of this encounter: 5\' 8"  (1.727 m).   Weight as of this encounter: 98.1 kg.   DVT prophylaxis: SCDs Code Status: Full Family Communication: None at bedside Disposition Plan: Home when medically stable    Consultants: Cardiology.  GI  Procedures: As above  Antimicrobials: None   Subjective: Patient feels well.  Denies any nausea vomiting or abdominal pain.  Looking forward to going home soon.  Objective: Vitals:   05/13/23 1203 05/13/23 1946 05/14/23 0421 05/14/23 0625  BP: 114/88 98/66 97/69    Pulse: 99 81 85   Resp: 20 18 18    Temp: 97.8 F (36.6 C) 97.7 F (36.5 C) 97.9 F (36.6 C)   TempSrc: Oral Oral Oral   SpO2: 96% 98% 98%   Weight:    98.1 kg  Height:        Intake/Output Summary (Last 24 hours) at 05/14/2023 1113 Last data filed at 05/14/2023 0902 Gross per 24 hour  Intake 240 ml  Output 2000 ml  Net -1760 ml   [employer]  05/12/23 0500 05/13/23 0500 05/14/23 0625  Weight: 103.1 kg 99.7 kg 98.1 kg    Examination:  General appearance: Awake alert.  In no distress Resp: Clear to auscultation bilaterally.  Normal effort Cardio: S1-S2 is normal regular.  No S3-S4.  No rubs murmurs or bruit GI: Abdomen is soft.  Nontender nondistended.  Bowel sounds are present normal.  No  masses organomegaly Extremities: No edema.  Full range of motion of lower extremities.   Data Reviewed: I have personally reviewed following labs and imaging studies  CBC: Recent Labs  Lab 05/10/23 0450 05/11/23 0430 05/12/23 0417 05/13/23 0408 05/14/23 0416  WBC 5.1 4.8 5.0 4.9 5.3  HGB 8.7* 9.1* 9.3* 9.3* 9.2*  HCT 27.0* 29.0* 28.7* 29.9* 28.9*  MCV 77.4* 78.4* 78.0* 78.5* 77.7*  PLT 95* 98* 99* 104* 97*   Basic Metabolic Panel: Recent Labs  Lab 05/09/23 0405 05/10/23 0450 05/11/23 0430 05/11/23 1712 05/12/23 0417 05/13/23 0408 05/14/23 0416  NA 136 137 134* 134* 134* 131* 135  K 3.8 4.0 4.2 4.6 4.5 4.1 4.5  CL 103 102 100 99 101 99 104  CO2 24 25 26 27 26 26 22   GLUCOSE 106* 87 96 87 92 98 92  BUN 33* 39* 40* 37* 38* 36* 37*  CREATININE 2.16* 2.46* 2.08* 2.12* 1.88* 1.85* 1.76*  CALCIUM  8.9 8.8* 8.7* 8.9 8.9 8.5* 8.7*  MG 2.1 1.9 1.9  --  2.0 1.8 2.0  PHOS 2.8 2.9 2.6  --   --   --   --    GFR: Estimated Creatinine Clearance: 46.9 mL/min (A) (by C-G formula based on SCr of 1.76 mg/dL (H)). Liver Function Tests: Recent Labs  Lab 05/08/23 0624 05/10/23 0450 05/11/23 0430 05/12/23 0417  AST 16 13* 12* 15  ALT 9 6 6 7   ALKPHOS 50 53 44 45  BILITOT 1.6* 1.0 1.0 0.9  PROT 7.8 6.8 6.5 7.0  ALBUMIN  3.5 3.5 3.4* 3.6    Recent Labs  Lab 05/10/23 0450  AMMONIA 20   Coagulation Profile: Recent Labs  Lab 05/08/23 1229  INR 1.6*    CBG: Recent Labs  Lab 05/13/23 0725 05/13/23 1201 05/13/23 1611 05/13/23 2126 05/14/23 0734  GLUCAP 96 99 93 113* 84    Thyroid Function Tests: Recent Labs    05/12/23 1308  TSH 3.089    Sepsis Labs: Recent Labs  Lab 05/08/23 1229  LATICACIDVEN 2.0*    Recent Results (from the past 240 hours)  SARS Coronavirus 2 by RT PCR (hospital order, performed in [facility] hospital lab) *cepheid single result test* Anterior Nasal Swab     Status: None   Collection Time: 05/08/23 12:01 AM   Specimen: Anterior  Nasal Swab  Result Value Ref Range Status   SARS Coronavirus 2 by RT PCR NEGATIVE NEGATIVE Final    Comment: (NOTE) SARS-CoV-2 target nucleic acids are NOT DETECTED.  The SARS-CoV-2 RNA is generally detectable in upper and lower respiratory specimens during the acute phase of infection. The lowest concentration of SARS-CoV-2 viral copies this assay can detect is 250 copies / mL. A negative result does not preclude SARS-CoV-2 infection and should not be used as the sole basis for treatment or other patient management decisions.  A negative result may occur with improper specimen collection / handling, submission of specimen other than nasopharyngeal swab, presence of viral mutation(s) within the areas targeted by this assay, and inadequate number of viral copies (<250 copies / mL). A negative result must be  combined with clinical observations, patient history, and epidemiological information.  Fact Sheet for Patients:   RoadLapTop.co.za  Fact Sheet for Healthcare Providers: http://kim-miller.com/  This test is not yet approved or  cleared by the [place]  FDA and has been authorized for detection and/or diagnosis of SARS-CoV-2 by FDA under an Emergency Use Authorization (EUA).  This EUA will remain in effect (meaning this test can be used) for the duration of the COVID-19 declaration under Section 564(b)(1) of the Act, 21 U.S.C. section 360bbb-3(b)(1), unless the authorization is terminated or revoked sooner.  Performed at [facility], [address], [place], [place] [address]   Body fluid culture w Gram Stain     Status: None   Collection Time: 05/09/23  1:38 PM   Specimen: PATH Cytology Peritoneal fluid  Result Value Ref Range Status   Specimen Description   Final    PERITONEAL Performed at [facility], [address], [place], [place] [address]    Special Requests   Final     NONE Performed at [facility], [address], [place], [place] [address]    Gram Stain NO WBC SEEN NO ORGANISMS SEEN   Final   Culture   Final    NO GROWTH 3 DAYS Performed at [facility], [address] [place], [place] [address]    Report Status 05/13/2023 FINAL  Final         Radiology Studies: US  Paracentesis Result Date: 05/12/2023 INDICATION: Patient is a 68 y/o male with history of CHF, cirrhosis, CKD, substance abuse, gastric and esophageal varices. Patient presents for a therapeutic paracentesis due to recurrent ascites. EXAM: ULTRASOUND GUIDED right mid to lower sided PARACENTESIS MEDICATIONS: 10mL lidocaine  1%. COMPLICATIONS: None immediate. PROCEDURE: Informed written consent was obtained from the patient after a discussion of the risks, benefits and alternatives to treatment. A timeout was performed prior to the initiation of the procedure. Initial ultrasound scanning demonstrates a large amount of ascites within the right mid to lower abdominal quadrant. The right mid to lower abdomen was prepped and draped in the usual sterile fashion. 1% lidocaine  was used for local anesthesia. Following this, a 19 gauge, 10-cm, Yueh catheter was introduced. An ultrasound image was saved for documentation purposes. The paracentesis was performed. The catheter was removed and a dressing was applied. The patient tolerated the procedure well without immediate post procedural complication. Aaron Aas FINDINGS: A total of approximately 5L of amber fluid was removed. IMPRESSION: Successful ultrasound-guided paracentesis yielding 5L liters of peritoneal fluid. Performed by Thomos Flies PA-C PLAN: If the patient eventually requires >/=2 paracenteses in a 30 day period, candidacy for formal evaluation by the [facility] Interventional Radiology Portal Hypertension [facility] will be assessed. Electronically Signed   By: Melven Stable.  Shick M.D.   On: 05/12/2023 12:11     Scheduled Meds:   allopurinol   100 mg Oral Daily   ferrous sulfate   325 mg Oral Q M,W,F   folic acid   1 mg Oral Daily   furosemide   40 mg Oral Daily   insulin  aspart  0-6 Units Subcutaneous TID WC   lidocaine   1 patch Transdermal Q24H   melatonin  5 mg Oral QHS   pantoprazole   40 mg Oral Daily   spironolactone   50 mg Oral Daily   thiamine   100 mg Oral Daily   Continuous Infusions:    Lavanna Rog, MD [facility] 05/14/2023, 11:13 AM

## 2023-05-14 NOTE — Progress Notes (Signed)
 Progress Note   Subjective  Chief Complaint: Decompensated cirrhosis  Today, patient tells me that he feels well, in general he feels like his abdominal swelling is going down and he has been urinating a lot more since starting diuretics yesterday.  No new complaints or concerns on his end.   Objective   Vital signs in last 24 hours: Temp:  [97.7 F (36.5 C)-97.9 F (36.6 C)] 97.9 F (36.6 C) (01/15 0421) Pulse Rate:  [81-99] 85 (01/15 0421) Resp:  [18-20] 18 (01/15 0421) BP: (97-114)/(66-88) 97/69 (01/15 0421) SpO2:  [96 %-98 %] 98 % (01/15 0421) Weight:  [98.1 kg] 98.1 kg (01/15 0625) Last BM Date : 05/13/23 General:  AA male in NAD Heart:  Regular rate and rhythm; no murmurs Lungs: Respirations even and unlabored, lungs CTA bilaterally Abdomen:  Soft, nontender and mild distension. Normal bowel sounds. Psych:  Cooperative. Normal mood and affect.  Intake/Output from previous day: 01/14 0701 - 01/15 0700 In: 340 [P.O.:340] Out: 1975 [Urine:1975] Intake/Output this shift: Total I/O In: 120 [P.O.:120] Out: 350 [Urine:350]  Lab Results: Recent Labs    05/12/23 0417 05/13/23 0408 05/14/23 0416  WBC 5.0 4.9 5.3  HGB 9.3* 9.3* 9.2*  HCT 28.7* 29.9* 28.9*  PLT 99* 104* 97*   BMET Recent Labs    05/12/23 0417 05/13/23 0408 05/14/23 0416  NA 134* 131* 135  K 4.5 4.1 4.5  CL 101 99 104  CO2 26 26 22   GLUCOSE 92 98 92  BUN 38* 36* 37*  CREATININE 1.88* 1.85* 1.76*  CALCIUM  8.9 8.5* 8.7*   LFT Recent Labs    05/12/23 0417  PROT 7.0  ALBUMIN  3.6  AST 15  ALT 7  ALKPHOS 45  BILITOT 0.9  BILIDIR 0.3*  IBILI 0.6    Studies/Results: US  Paracentesis Result Date: 05/12/2023 INDICATION: Patient is a 68 y/o male with history of CHF, cirrhosis, CKD, substance abuse, gastric and esophageal varices. Patient presents for a therapeutic paracentesis due to recurrent ascites. EXAM: ULTRASOUND GUIDED right mid to lower sided PARACENTESIS MEDICATIONS: 10mL  lidocaine  1%. COMPLICATIONS: None immediate. PROCEDURE: Informed written consent was obtained from the patient after a discussion of the risks, benefits and alternatives to treatment. A timeout was performed prior to the initiation of the procedure. Initial ultrasound scanning demonstrates a large amount of ascites within the right mid to lower abdominal quadrant. The right mid to lower abdomen was prepped and draped in the usual sterile fashion. 1% lidocaine  was used for local anesthesia. Following this, a 19 gauge, 10-cm, Yueh catheter was introduced. An ultrasound image was saved for documentation purposes. The paracentesis was performed. The catheter was removed and a dressing was applied. The patient tolerated the procedure well without immediate post procedural complication. Aaron Aas FINDINGS: A total of approximately 5L of amber fluid was removed. IMPRESSION: Successful ultrasound-guided paracentesis yielding 5L liters of peritoneal fluid. Performed by Thomos Flies PA-C PLAN: If the patient eventually requires >/=2 paracenteses in a 30 day period, candidacy for formal evaluation by the [facility] Interventional Radiology Portal Hypertension Clinic will be assessed. Electronically Signed   By: Melven Stable.  Shick M.D.   On: 05/12/2023 12:11     Assessment / Plan:   Assessment: 1.  Decompensated cirrhosis: With known portal hypertension with esophageal varices and gastric varices, came in after missing 2 months of diuretics with increased abdominal swelling and leg swelling, better after 2 paracentesis, the last on 05/12/2023 with 5 L each, started diuretics on 05/13/2023 and responding  well 2.  Cocaine abuse: Last used 2 weeks ago 3.  Microcytic anemia/iron deficiency anemia: Stable 4.  Acute on chronic diastolic heart failure 5.  Diabetes type 2 6.  Thrombocytopenia: Related to cirrhosis 7.  A-fib/flutter: New diagnosis this admission  Plan: 1.  Patient was started on Spironolactone  50 mg daily and Lasix  40  mg daily on 05/13/2023, would continue these medications now.  Electrolytes look good today.  Creatinine is normal. 2.  Continue current diet 3.  Continue to monitor CBC/CMP daily 4.  Again if patient misses appointment after discharge from the hospital he will be dismissed from the [facility] GI practice after previously missing multiple appointments 5.  At some point would benefit from repeat EGD +/- colonoscopy  Thank you for your kind consultation, we will continue to follow.   LOS: 7 days   Graciella Lavender  05/14/2023, 11:42 AM

## 2023-05-14 NOTE — Progress Notes (Signed)
 TOC discharge meds in secure bag delivered to nurse at bedside by this RN.

## 2023-05-14 NOTE — Progress Notes (Signed)
 Patient discharging home.  IV removed - WNL.  Reviewed AVS and medications.  Instructed to follow up with cardio and GI.  Waiting for meds to be delivered to room.  Patient in NAD at this time.  [facility] to [facility] patient home and will be called for set up once meds are delivered to room.

## 2023-05-14 NOTE — Care Management Important Message (Signed)
 Important Message  Patient Details IM Letter given. Name: BRETTLEY NIEVES MRN: [id number] Date of Birth: 07-06-1955   Important Message Given:  Yes - Medicare IM     Curtiss Dowdy 05/14/2023, 12:56 PM

## 2023-05-14 NOTE — Telephone Encounter (Signed)
 Attempted to reach patient at his listed home number twice, no voicemail is set up.  Attempted to reach patient on mobile number, line goes to a fast busy signal. Will attempt to reach patient at a later time.  1-week lab reminder and order in epic.  F/U scheduled with Deanna May, NP on Thursday, 06/05/23 at 9:30 am.

## 2023-05-14 NOTE — Progress Notes (Signed)
 Mobility Specialist - Progress Note   05/14/23 0933  Mobility  Activity Ambulated with assistance in hallway  Level of Assistance Standby assist, set-up cues, supervision of patient - no hands on  Assistive Device Front wheel walker  Distance Ambulated (ft) 240 ft  Activity Response Tolerated well  Mobility Referral Yes  Mobility visit 1 Mobility  Mobility Specialist Start Time (ACUTE ONLY) 0909  Mobility Specialist Stop Time (ACUTE ONLY) 0933  Mobility Specialist Time Calculation (min) (ACUTE ONLY) 24 min   Pt received in bed and agreeable to mobility. Pt was minA from STS & SB during ambulation. No complaints during session. Pt to bed after session with all needs met. Bed alarm on.    [facility]

## 2023-05-14 NOTE — Discharge Summary (Signed)
 [facility]  Physician Discharge Summary   Patient ID: John [name] MRN: [id number] DOB/AGE: 67-Jun-1957 67 y.o.  Admit date: 05/07/2023 Discharge date:   05/14/2023   PCP: Patient, No Pcp Per  DISCHARGE DIAGNOSES:    Stage 3b chronic kidney disease (HCC)   Cirrhosis (HCC)   (HFpEF) heart failure with preserved ejection fraction (HCC)     COPD with acute exacerbation (HCC)   Atrial flutter (HCC)   RECOMMENDATIONS FOR OUTPATIENT FOLLOW UP: GI and cardiology to arrange OP f/u CBC and CMET in 5-6 days   Home Health:None  Equipment/Devices:None   CODE STATUS:Full   DISCHARGE CONDITION: fair  Diet recommendation: Carb modified  INITIAL HISTORY: 68 year old male with history of CKD stage IIIa, alcoholic cirrhosis with variceal bleeding and paracentesis many years ago, quit drinking alcohol greater than 10 years ago, ongoing cocaine abuse, noncompliance with medications including diuretics for the last several months presented with worsening abdominal and leg swelling. On presentation, he was found to have pain new onset A-fib with RVR. Cardiology was consulted. He underwent paracentesis and removal of 5.2 L fluid by IR on 05/09/2023. He underwent ultrasound-guided paracentesis again on 05/12/2023 with removal of 5 L fluid. Cardiology signed off on 05/12/2023.     HOSPITAL COURSE:   Newly diagnosed atrial flutter -Currently rate controlled.   Carvedilol  discontinued by cardiology due to hypotension. Cardiology signed off on 05/12/2023: Recommended outpatient follow-up.  Not a good candidate for anticoagulation because of cirrhosis with history of esophageal and gastric varices   Acute on chronic diastolic heart failure -Most recent echo showed EF of 55 to 60%.   Cardiology signed off on 05/12/2023: Recommended to reestablish diuretics prior to discharge and outpatient follow-up with cardiology.     Decompensated cirrhosis of liver with ascites and history of  esophageal and gastric varices with prior history of GI bleed -Status post paracentesis and removal of 5.2 L fluid by IR on 05/09/2023.   -underwent ultrasound-guided paracentesis again on 05/12/2023 with removal of 5 L fluid. Currently on furosemide  and spironolactone . Seen by gastroenterology. Outpatient f/u.   AKI on CKD stage IIIa -creatinine of 1.43 on 11/05/2022.   Creatinine was noted to be 1.85 yesterday and 1.76 today.   Microcytic anemia/iron deficiency anemia Anemia of chronic disease -Continue supplemental oral iron.  Hemoglobin stable.    Thrombocytopenia -Possibly due to liver cirrhosis.  Platelets stable.  No signs of bleeding.     Hypomagnesemia -Resolved   Hyponatremia -Mild.  Monitor   Cocaine abuse -counseled    History of gout -Continue allopurinol    Diabetes mellitus type 2 -Blood sugars stable.  Continue CBGs with SSI   Obesity Estimated body mass index is 32.88 kg/m as calculated from the following:   Height as of this encounter: 5\' 8"  (1.727 m).   Weight as of this encounter: 98.1 kg.     Stable for discharge.   PERTINENT LABS:  The results of significant diagnostics from this hospitalization (including imaging, microbiology, ancillary and laboratory) are listed below for reference.    Microbiology: Recent Results (from the past 240 hours)  SARS Coronavirus 2 by RT PCR (hospital order, performed in [facility] hospital lab) *cepheid single result test* Anterior Nasal Swab     Status: None   Collection Time: 05/08/23 12:01 AM   Specimen: Anterior Nasal Swab  Result Value Ref Range Status   SARS Coronavirus 2 by RT PCR NEGATIVE NEGATIVE Final    Comment: (NOTE) SARS-CoV-2 target nucleic acids are NOT  DETECTED.  The SARS-CoV-2 RNA is generally detectable in upper and lower respiratory specimens during the acute phase of infection. The lowest concentration of SARS-CoV-2 viral copies this assay can detect is 250 copies / mL. A negative result  does not preclude SARS-CoV-2 infection and should not be used as the sole basis for treatment or other patient management decisions.  A negative result may occur with improper specimen collection / handling, submission of specimen other than nasopharyngeal swab, presence of viral mutation(s) within the areas targeted by this assay, and inadequate number of viral copies (<250 copies / mL). A negative result must be combined with clinical observations, patient history, and epidemiological information.  Fact Sheet for Patients:   RoadLapTop.co.za  Fact Sheet for Healthcare Providers: http://kim-miller.com/  This test is not yet approved or  cleared by the [place]  FDA and has been authorized for detection and/or diagnosis of SARS-CoV-2 by FDA under an Emergency Use Authorization (EUA).  This EUA will remain in effect (meaning this test can be used) for the duration of the COVID-19 declaration under Section 564(b)(1) of the Act, 21 U.S.C. section 360bbb-3(b)(1), unless the authorization is terminated or revoked sooner.  Performed at [facility], [address], [place], [place] [address]   Body fluid culture w Gram Stain     Status: None   Collection Time: 05/09/23  1:38 PM   Specimen: PATH Cytology Peritoneal fluid  Result Value Ref Range Status   Specimen Description   Final    PERITONEAL Performed at [facility], [address], [place], [place] [address]    Special Requests   Final    NONE Performed at [facility], [address], [place], [place] [address]    Gram Stain NO WBC SEEN NO ORGANISMS SEEN   Final   Culture   Final    NO GROWTH 3 DAYS Performed at [facility], [address], [place], [place] [address]    Report Status 05/13/2023 FINAL  Final     Labs:   Basic Metabolic Panel: Recent Labs  Lab 05/09/23 0405 05/10/23 0450 05/11/23 0430  05/11/23 1712 05/12/23 0417 05/13/23 0408 05/14/23 0416  NA 136 137 134* 134* 134* 131* 135  K 3.8 4.0 4.2 4.6 4.5 4.1 4.5  CL 103 102 100 99 101 99 104  CO2 24 25 26 27 26 26 22   GLUCOSE 106* 87 96 87 92 98 92  BUN 33* 39* 40* 37* 38* 36* 37*  CREATININE 2.16* 2.46* 2.08* 2.12* 1.88* 1.85* 1.76*  CALCIUM  8.9 8.8* 8.7* 8.9 8.9 8.5* 8.7*  MG 2.1 1.9 1.9  --  2.0 1.8 2.0  PHOS 2.8 2.9 2.6  --   --   --   --    Liver Function Tests: Recent Labs  Lab 05/08/23 0624 05/10/23 0450 05/11/23 0430 05/12/23 0417  AST 16 13* 12* 15  ALT 9 6 6 7   ALKPHOS 50 53 44 45  BILITOT 1.6* 1.0 1.0 0.9  PROT 7.8 6.8 6.5 7.0  ALBUMIN  3.5 3.5 3.4* 3.6    Recent Labs  Lab 05/10/23 0450  AMMONIA 20   CBC: Recent Labs  Lab 05/10/23 0450 05/11/23 0430 05/12/23 0417 05/13/23 0408 05/14/23 0416  WBC 5.1 4.8 5.0 4.9 5.3  HGB 8.7* 9.1* 9.3* 9.3* 9.2*  HCT 27.0* 29.0* 28.7* 29.9* 28.9*  MCV 77.4* 78.4* 78.0* 78.5* 77.7*  PLT 95* 98* 99* 104* 97*    BNP: BNP (last  3 results) Recent Labs    08/05/22 1305 11/08/22 1853 05/07/23 1622  BNP 452.3* 493.1* 219.8*     CBG: Recent Labs  Lab 05/13/23 1201 05/13/23 1611 05/13/23 2126 05/14/23 0734 05/14/23 1129  GLUCAP 99 93 113* 84 86     IMAGING STUDIES US  Paracentesis Result Date: 05/12/2023 INDICATION: Patient is a 68 y/o male with history of CHF, cirrhosis, CKD, substance abuse, gastric and esophageal varices. Patient presents for a therapeutic paracentesis due to recurrent ascites. EXAM: ULTRASOUND GUIDED right mid to lower sided PARACENTESIS MEDICATIONS: 10mL lidocaine  1%. COMPLICATIONS: None immediate. PROCEDURE: Informed written consent was obtained from the patient after a discussion of the risks, benefits and alternatives to treatment. A timeout was performed prior to the initiation of the procedure. Initial ultrasound scanning demonstrates a large amount of ascites within the right mid to lower abdominal quadrant. The right  mid to lower abdomen was prepped and draped in the usual sterile fashion. 1% lidocaine  was used for local anesthesia. Following this, a 19 gauge, 10-cm, Yueh catheter was introduced. An ultrasound image was saved for documentation purposes. The paracentesis was performed. The catheter was removed and a dressing was applied. The patient tolerated the procedure well without immediate post procedural complication. Aaron Aas FINDINGS: A total of approximately 5L of amber fluid was removed. IMPRESSION: Successful ultrasound-guided paracentesis yielding 5L liters of peritoneal fluid. Performed by Thomos Flies PA-C PLAN: If the patient eventually requires >/=2 paracenteses in a 30 day period, candidacy for formal evaluation by the [facility] Interventional Radiology Portal Hypertension Clinic will be assessed. Electronically Signed   By: Melven Stable.  Shick M.D.   On: 05/12/2023 12:11   ECHOCARDIOGRAM COMPLETE Result Date: 05/10/2023    ECHOCARDIOGRAM REPORT   Patient Name:   BISHARA RIDEAUX Date of Exam: 05/10/2023 Medical Rec #:  [id number]           Height:       68.0 in Accession #:    [id number]          Weight:       225.7 lb Date of Birth:  March 03, 1956            BSA:          2.152 m Patient Age:    67 years            BP:           95/76 mmHg Patient Gender: M                   HR:           80 bpm. Exam Location:  Inpatient Procedure: 2D Echo, Cardiac Doppler and Color Doppler Indications:    Atrial Fibrillation  History:        Patient has prior history of Echocardiogram examinations, most                 recent 08/02/2022. CAD, Stroke and COPD, Signs/Symptoms:Shortness                 of Breath; Risk Factors:Hypertension.  Sonographer:    Juanita Shaw Referring Phys: [id number] TIFFANY Inglis IMPRESSIONS  1. Left ventricular ejection fraction, by estimation, is 55 to 60%. The left ventricle has normal function. The left ventricle has no regional wall motion abnormalities. There is mild left ventricular hypertrophy. Left  ventricular diastolic parameters are indeterminate.  2. Right ventricular systolic function is low normal. The right ventricular size is normal. There is normal pulmonary artery systolic  pressure.  3. Left atrial size was moderately dilated.  4. The mitral valve is normal in structure. Mild mitral valve regurgitation.  5. The aortic valve is normal in structure. Aortic valve regurgitation is not visualized.  6. Aortic dilatation noted. There is moderate dilatation of the aortic root, measuring 44 mm. There is mild dilatation of the ascending aorta, measuring 42 mm. FINDINGS  Left Ventricle: Left ventricular ejection fraction, by estimation, is 55 to 60%. The left ventricle has normal function. The left ventricle has no regional wall motion abnormalities. The left ventricular internal cavity size was normal in size. There is  mild left ventricular hypertrophy. Left ventricular diastolic parameters are indeterminate. Right Ventricle: The right ventricular size is normal. Right vetricular wall thickness was not assessed. Right ventricular systolic function is low normal. There is normal pulmonary artery systolic pressure. The tricuspid regurgitant velocity is 2.29 m/s, and with an assumed right atrial pressure of 3 mmHg, the estimated right ventricular systolic pressure is 24.0 mmHg. Left Atrium: Left atrial size was moderately dilated. Right Atrium: Right atrial size was normal in size. Pericardium: There is no evidence of pericardial effusion. Mitral Valve: The mitral valve is normal in structure. Mild mitral valve regurgitation. MV peak gradient, 4.4 mmHg. The mean mitral valve gradient is 2.0 mmHg. Tricuspid Valve: The tricuspid valve is normal in structure. Tricuspid valve regurgitation is mild. Aortic Valve: The aortic valve is normal in structure. Aortic valve regurgitation is not visualized. Aortic valve mean gradient measures 1.0 mmHg. Aortic valve peak gradient measures 1.9 mmHg. Aortic valve area, by VTI  measures 3.37 cm. Pulmonic Valve: The pulmonic valve was not well visualized. Pulmonic valve regurgitation is not visualized. Aorta: Aortic dilatation noted. There is moderate dilatation of the aortic root, measuring 44 mm. There is mild dilatation of the ascending aorta, measuring 42 mm. IAS/Shunts: No atrial level shunt detected by color flow Doppler.  LEFT VENTRICLE PLAX 2D LVIDd:         4.70 cm      Diastology LVIDs:         3.30 cm      LV e' medial:    11.50 cm/s LV PW:         1.00 cm      LV E/e' medial:  8.0 LV IVS:        1.30 cm      LV e' lateral:   13.50 cm/s LVOT diam:     2.40 cm      LV E/e' lateral: 6.8 LV SV:         39 LV SV Index:   18 LVOT Area:     4.52 cm  LV Volumes (MOD) LV vol d, MOD A2C: 167.0 ml LV vol d, MOD A4C: 103.0 ml LV vol s, MOD A2C: 66.2 ml LV vol s, MOD A4C: 50.8 ml LV SV MOD A2C:     100.8 ml LV SV MOD A4C:     103.0 ml LV SV MOD BP:      69.7 ml RIGHT VENTRICLE RV Basal diam:  3.50 cm RV Mid diam:    2.20 cm RV S prime:     6.64 cm/s LEFT ATRIUM             Index        RIGHT ATRIUM           Index LA diam:        5.10 cm 2.37 cm/m   RA Area:  12.80 cm LA Vol (A2C):   96.8 ml 44.99 ml/m  RA Volume:   25.90 ml  12.04 ml/m LA Vol (A4C):   95.1 ml 44.20 ml/m LA Biplane Vol: 99.3 ml 46.15 ml/m  AORTIC VALVE                    PULMONIC VALVE AV Area (Vmax):    3.61 cm     PV Vmax:       0.57 m/s AV Area (Vmean):   2.99 cm     PV Peak grad:  1.3 mmHg AV Area (VTI):     3.37 cm AV Vmax:           69.60 cm/s AV Vmean:          46.500 cm/s AV VTI:            0.116 m AV Peak Grad:      1.9 mmHg AV Mean Grad:      1.0 mmHg LVOT Vmax:         55.50 cm/s LVOT Vmean:        30.700 cm/s LVOT VTI:          0.086 m LVOT/AV VTI ratio: 0.74  AORTA Ao Root diam: 4.40 cm Ao Asc diam:  4.20 cm MITRAL VALVE               TRICUSPID VALVE MV Area (PHT): 6.17 cm    TR Peak grad:   21.0 mmHg MV Area VTI:   3.03 cm    TR Vmax:        229.00 cm/s MV Peak grad:  4.4 mmHg MV Mean grad:  2.0  mmHg    SHUNTS MV Vmax:       1.05 m/s    Systemic VTI:  0.09 m MV Vmean:      54.4 cm/s   Systemic Diam: 2.40 cm MV Decel Time: 123 msec MV E velocity: 92.40 cm/s Ola Berger MD Electronically signed by Ola Berger MD Signature Date/Time: 05/10/2023/3:17:53 PM    Final    US  Paracentesis Result Date: 05/09/2023 INDICATION: Patient with history of chronic kidney disease, alcoholic cirrhosis, esophageal/gastric varices, substance abuse, recurrent ascites. Request received for diagnostic and therapeutic paracentesis. EXAM: ULTRASOUND GUIDED DIAGNOSTIC AND THERAPEUTIC PARACENTESIS MEDICATIONS: 8 ml 1% lidocaine  COMPLICATIONS: None immediate. PROCEDURE: Informed written consent was obtained from the patient after a discussion of the risks, benefits and alternatives to treatment. A timeout was performed prior to the initiation of the procedure. Initial ultrasound scanning demonstrates a large amount of ascites within the right mid to lower abdominal quadrant. The right mid to lower abdomen was prepped and draped in the usual sterile fashion. 1% lidocaine  was used for local anesthesia. Following this, a 19 gauge, 10-cm, Yueh catheter was introduced. An ultrasound image was saved for documentation purposes. The paracentesis was performed. The catheter was removed and a dressing was applied. The patient tolerated the procedure well without immediate post procedural complication. FINDINGS: A total of approximately 5.2 liters of hazy,amber fluid was removed. Samples were sent to the laboratory as requested by the clinical team. IMPRESSION: Successful ultrasound-guided diagnostic and therapeutic paracentesis yielding 5.2 liters of peritoneal fluid. PLAN: If the patient eventually requires >/=2 paracenteses in a 30 day period, candidacy for formal evaluation by the [facility] Interventional Radiology Portal Hypertension Clinic will be assessed. Performed by: Wash Hack Electronically Signed   By: Erica Hau M.D.    On: 05/09/2023 16:18   DG  Chest 2 View Result Date: 05/07/2023 CLINICAL DATA:  Dyspnea on exertion. EXAM: CHEST - 2 VIEW COMPARISON:  Chest radiograph dated 03/25/2023. FINDINGS: There is mild cardiomegaly with mild central vascular congestion. No focal consolidation, pleural effusion, or pneumothorax. Bilateral hilar prominence suggestive of pulmonary hypertension. Mild atherosclerotic calcification of the aortic arch. No acute osseous pathology. IMPRESSION: Mild cardiomegaly with mild central vascular congestion. No focal consolidation. Electronically Signed   By: Angus Bark M.D.   On: 05/07/2023 17:24    DISCHARGE EXAMINATION: See progress note from earlier today.  DISPOSITION: Home  Discharge Instructions     (HEART FAILURE PATIENTS) Call MD:  Anytime you have any of the following symptoms: 1) 3 pound weight gain in 24 hours or 5 pounds in 1 week 2) shortness of breath, with or without a dry hacking cough 3) swelling in the hands, feet or stomach 4) if you have to sleep on extra pillows at night in order to breathe.   Complete by: As directed    Call MD for:  difficulty breathing, headache or visual disturbances   Complete by: As directed    Call MD for:  extreme fatigue   Complete by: As directed    Call MD for:  persistant dizziness or light-headedness   Complete by: As directed    Call MD for:  persistant nausea and vomiting   Complete by: As directed    Call MD for:  severe uncontrolled pain   Complete by: As directed    Call MD for:  temperature >100.4   Complete by: As directed    Diet - low sodium heart healthy   Complete by: As directed    Discharge instructions   Complete by: As directed    Please keep your follow-up appointments.  Take your medications as prescribed.  You were cared for by a hospitalist during your hospital stay. If you have any questions about your discharge medications or the care you received while you were in the hospital after you are  discharged, you can call the unit and asked to speak with the hospitalist on call if the hospitalist that took care of you is not available. Once you are discharged, your primary care physician will handle any further medical issues. Please note that NO REFILLS for any discharge medications will be authorized once you are discharged, as it is imperative that you return to your primary care physician (or establish a relationship with a primary care physician if you do not have one) for your aftercare needs so that they can reassess your need for medications and monitor your lab values. If you do not have a primary care physician, you can call [phone] for a physician referral.   Increase activity slowly   Complete by: As directed          Allergies as of 05/14/2023       Reactions   Penicillins Other (See Comments)   Convulsions, Patient could not walk, childhood allergy  Has patient had a PCN reaction causing immediate rash, facial/tongue/throat swelling, SOB or lightheadedness with hypotension: No Has patient had a PCN reaction causing severe rash involving mucus membranes or skin necrosis: No Has patient had a PCN reaction that required hospitalization No Has patient had a PCN reaction occurring within the last 10 years: No If all of the above answers are "NO", then may proceed with Cephalosporin use.        Medication List     STOP taking these medications  carvedilol  3.125 MG tablet Commonly known as: COREG    empagliflozin  10 MG Tabs tablet Commonly known as: Jardiance        TAKE these medications    acetaminophen  325 MG tablet Commonly known as: TYLENOL  Take 650 mg by mouth every 8 (eight) hours as needed for moderate pain (NOT TO EXCEED 2,000 MG/DAY).   allopurinol  100 MG tablet Commonly known as: ZYLOPRIM  Take 1 tablet (100 mg total) by mouth daily.   atorvastatin  40 MG tablet Commonly known as: LIPITOR Take 40 mg by mouth daily.   brimonidine  0.15 %  ophthalmic solution Commonly known as: ALPHAGAN  Place 1 drop into both eyes at bedtime.   ferrous sulfate  325 (65 FE) MG EC tablet Take 325 mg by mouth every Monday, Wednesday, and Friday.   Flovent  HFA 220 MCG/ACT inhaler Generic drug: fluticasone  Inhale 2 puffs into the lungs in the morning and at bedtime.   folic acid  1 MG tablet Commonly known as: FOLVITE  Take 1 tablet (1 mg total) by mouth daily. Start taking on: May 15, 2023   furosemide  40 MG tablet Commonly known as: LASIX  Take 1 tablet (40 mg total) by mouth daily. Start taking on: May 15, 2023 What changed:  medication strength how much to take when to take this   ketorolac  0.5 % ophthalmic solution Commonly known as: ACULAR  Place 1 drop into the right eye 4 (four) times daily.   lidocaine  5 % Commonly known as: Lidoderm  Place 1 patch onto the skin daily. Remove & Discard patch within 12 hours or as directed by MD   melatonin 5 MG Tabs Take 5 mg by mouth at bedtime.   ofloxacin 0.3 % ophthalmic solution Commonly known as: OCUFLOX Place 1 drop into the right eye 4 (four) times daily.   omeprazole  40 MG capsule Commonly known as: PRILOSEC TAKE ONE CAPSULE BY MOUTH DAILY What changed: when to take this   potassium chloride  10 MEQ tablet Commonly known as: KLOR-CON  Take 10 mEq by mouth daily.   prednisoLONE acetate 1 % ophthalmic suspension Commonly known as: PRED FORTE Place 1 drop into the right eye 4 (four) times daily.   ProAir  HFA 108 (90 Base) MCG/ACT inhaler Generic drug: albuterol  Inhale 2 puffs into the lungs every 6 (six) hours as needed for wheezing or shortness of breath.   Salonpas Pain Relief Patch Ptch Apply 1 patch topically daily as needed (for pain- affected area).   spironolactone  50 MG tablet Commonly known as: ALDACTONE  Take 1 tablet (50 mg total) by mouth daily.   terbinafine 1 % cream Commonly known as: LAMISIL Apply 1 Application topically See admin instructions.  Apply to feet as directed every other day   thiamine  100 MG tablet Commonly known as: VITAMIN B1 Take 1 tablet (100 mg total) by mouth daily. Start taking on: May 15, 2023          Follow-up Information     Marlyse Single T, [place] Follow up.   Specialties: Cardiology, Physician Assistant Why: [facility] - [address] location - cardiology follow-up with PA Marlyse Single on Friday May 23, 2023 9:15 AM (Arrive by 9:00 AM). Contact information: [address] [place] [address] [phone]         Mansouraty, Albino Alu., MD Follow up in 2 week(s).   Specialties: Gastroenterology, Internal Medicine Contact information: [address] [place] [address] [phone]                 TOTAL  DISCHARGE TIME: 35 mins  Lanett Lasorsa Management consultant on [email or web address]  05/14/2023, 1:35 PM

## 2023-05-15 ENCOUNTER — Other Ambulatory Visit (HOSPITAL_COMMUNITY): Payer: Self-pay

## 2023-05-16 LAB — PATHOLOGIST SMEAR REVIEW

## 2023-05-16 NOTE — Telephone Encounter (Signed)
Tried to reach patient on home phone. No answer, no voicemail. Also tried to reach patient on mobile number provided. No answer and then goes to busy signal. Nursing will continue to try reaching patient at a later time.

## 2023-05-20 ENCOUNTER — Other Ambulatory Visit (HOSPITAL_COMMUNITY): Payer: Self-pay | Admitting: Internal Medicine

## 2023-05-20 DIAGNOSIS — K7031 Alcoholic cirrhosis of liver with ascites: Secondary | ICD-10-CM

## 2023-05-21 NOTE — Telephone Encounter (Signed)
Patient is due for CMET. Attempted to reach patient on his listed phone numbers. Home phone did not have a voicemail set up, and the other number rings but goes to a fast busy signal. Per DPR ok to reach out to his niece, [name]. I left message for [facility] to call back.

## 2023-05-22 NOTE — Progress Notes (Signed)
Cardiology Office Note:    Date:  05/23/2023  ID:  John [name], DOB Jan 14, 1956, MRN [id number] PCP: Patient, No Pcp Per  [facility] Providers Cardiologist:  Chilton Si, MD       Patient Profile:      (HFpEF) heart failure with preserved ejection fraction  TTE 05/10/2023: EF 55-60, no RWMA, mild LVH, low normal RVSF, normal PASP, moderate LAE, mild MR, moderate dilation of aortic root (44 mm), mild dilation of ascending aorta (42 mm) Persistent atrial fibrillation/flutter Not a candidate for anticoagulation (esoph varices, hx of UGI bleed) Dilated aorta TTE 08/01/2022: Aortic root 41 mm, ascending aorta 40 mm TTE 05/10/2023: Aortic root 44 mm, ascending aorta 42 mm Hypertension  Chronic kidney disease  Chronic Obstructive Pulmonary Disease  Renal cell CA s/p cryoablation  Hep C  Alcoholic Cirrhosis  Esophageal varices Hx of UGI bleed  Seizures Polysubstance abuse / Cocaine       History of Present Illness:  Discussed the use of AI scribe software for clinical note transcription with the patient, who gave verbal consent to proceed.  John [name] is a 68 y.o. male who returns for post hospital follow up. He was admitted 1/8-1/15 with AFib/Flutter with RVR in the setting of a/c CHF. He is not a good candidate for anticoagulation given hx of esophageal varices and prior GI bleeding. He was managed with rate control Rx. He was diuresed and required paracentesis 2 due to ascites (neg 5.2 L and neg 5 L). SCr increased to 1.85 >> 1.76 at DC. He is here alone. He has not experienced any palpitations since discharge. Since discharge, the patient reports feeling "pretty good" and better than prior to hospitalization. He was notably short of breath upon walking in the office, but denies any current shortness of breath. The patient has a history of back problems, which limits his mobility. He denies any chest pain, pressure, or tightness, and reports no difficulty  breathing when lying flat. He does not feel that his abdomen is somewhat distended. The patient has occasional nosebleeds but denies any other bleeding or changes in stool. He reports no dizziness or fainting episodes.     Review of Systems  Gastrointestinal:  Negative for hematemesis, hematochezia and melena.  Genitourinary:  Negative for hematuria.  -See HPI    Studies Reviewed:       Labs reviewed-chart review 05/12/2023: ALT 7, TSH 3.089 05/14/2023: Potassium 4.5, creatinine 1.76, magnesium 2, GFR 42, hemoglobin 9.2       Risk Assessment/Calculations:    CHA2DS2-VASc Score = 3   This indicates a 3.2% annual risk of stroke. The patient's score is based upon: CHF History: 1 HTN History: 1 Diabetes History: 0 Stroke History: 0 Vascular Disease History: 0 Age Score: 1 Gender Score: 0            Physical Exam:   VS:  BP 98/60   Pulse 84   Ht 5\' 7"  (1.702 m)   Wt 222 lb 9.6 oz (101 kg)   SpO2 97%   BMI 34.86 kg/m    Wt Readings from Last 3 Encounters:  05/23/23 222 lb 9.6 oz (101 kg)  05/14/23 216 lb 4.3 oz (98.1 kg)  11/08/22 224 lb 13.9 oz (102 kg)    Constitutional:      Appearance: Healthy appearance. Not in distress.  Neck:     Vascular: JVD elevated.  Pulmonary:     Breath sounds: Normal breath sounds. No wheezing. No rales.  Cardiovascular:     Normal rate. Irregularly irregular rhythm.     Murmurs: There is no murmur.  Edema:    Peripheral edema present.    Pretibial: bilateral 2+ edema of the pretibial area. Abdominal:     General: There is distension.  Skin:    General: Skin is warm and dry.        Assessment and Plan:   Assessment & Plan Chronic heart failure with preserved ejection fraction (HCC) EF 55-60 by TTE 04/2023. He was recently admitted with a/c CHF and AFlutter. He required paracentesis x 2 and IV diuresis. Wt is up 6 lbs since DC. He is still volume overloaded. BP is on the low side. -Increase Lasix to 60 mg once daily  -Continue  Spironolactone 50 mg once daily  -CMET, CBC today -Keep follow up with Dr. Elwyn Lade in CHF clinic in 2 weeks - get repeat BMET at that time. -Follow up Dr. Duke Salvia in 3 mos Atrial flutter, unspecified type (HCC) HR is elevated today (100-108 on my manual count). His coreg was DC'd in the hospital due to low BP. I think he can tolerate a small amount of Metoprolol succinate. He is not a candidate for anticoagulation due to hx of UGI bleeding, varices in the setting of cirrhosis, portal gastropathy.  -Start Metoprolol succinate 12.5 mg once daily  -I will review with Dr. Duke Salvia +/- referral to EP for LAAOD Ascending aorta dilatation (HCC) 42 mm on TTE. We can continue to monitor this with serial TTEs. Stage 3b chronic kidney disease (HCC) Recent SCr 1.76. CMET today. Repeat BMET in 2 weeks with increase in Lasix.  Alcoholic cirrhosis of liver with ascites (HCC) Portal gastropathy with esophageal varices. Hx of UGI bleeding. He was recently admitted with a/c CHF w ascites s/p paracentesis x 2. 5 L was removed each time. He has follow up with GI soon.        Dispo:  Return in 2 weeks (on 06/06/2023) for Scheduled Follow Up at the CHF clinic. Then see Dr. Duke Salvia in 3 months. .  Signed, Tereso Newcomer, PA-C

## 2023-05-23 ENCOUNTER — Encounter: Payer: Self-pay | Admitting: Physician Assistant

## 2023-05-23 ENCOUNTER — Ambulatory Visit: Payer: No Typology Code available for payment source | Attending: Physician Assistant | Admitting: Physician Assistant

## 2023-05-23 ENCOUNTER — Other Ambulatory Visit: Payer: Self-pay | Admitting: *Deleted

## 2023-05-23 VITALS — BP 98/60 | HR 84 | Ht 67.0 in | Wt 222.6 lb

## 2023-05-23 DIAGNOSIS — N1832 Chronic kidney disease, stage 3b: Secondary | ICD-10-CM | POA: Diagnosis not present

## 2023-05-23 DIAGNOSIS — I5032 Chronic diastolic (congestive) heart failure: Secondary | ICD-10-CM

## 2023-05-23 DIAGNOSIS — I77810 Thoracic aortic ectasia: Secondary | ICD-10-CM

## 2023-05-23 DIAGNOSIS — K7031 Alcoholic cirrhosis of liver with ascites: Secondary | ICD-10-CM

## 2023-05-23 DIAGNOSIS — I4892 Unspecified atrial flutter: Secondary | ICD-10-CM

## 2023-05-23 LAB — BASIC METABOLIC PANEL
BUN/Creatinine Ratio: 11 (ref 10–24)
BUN: 24 mg/dL (ref 8–27)
CO2: 24 mmol/L (ref 20–29)
Calcium: 8.9 mg/dL (ref 8.6–10.2)
Chloride: 102 mmol/L (ref 96–106)
Creatinine, Ser: 2.15 mg/dL — ABNORMAL HIGH (ref 0.76–1.27)
Glucose: 94 mg/dL (ref 70–99)
Potassium: 4.9 mmol/L (ref 3.5–5.2)
Sodium: 140 mmol/L (ref 134–144)
eGFR: 33 mL/min/{1.73_m2} — ABNORMAL LOW (ref >59)

## 2023-05-23 LAB — CBC

## 2023-05-23 MED ORDER — METOPROLOL SUCCINATE ER 25 MG PO TB24: 12.5000 mg | ORAL_TABLET | Freq: Every day | ORAL | 3 refills | Status: DC

## 2023-05-23 MED ORDER — FUROSEMIDE 40 MG PO TABS: 60.0000 mg | ORAL_TABLET | Freq: Every day | ORAL | 3 refills | Status: DC

## 2023-05-23 NOTE — Patient Instructions (Addendum)
Medication Instructions:  Your physician has recommended you make the following change in your medication:   INCREASE the Fuorsemide(Lasix) to 40 mg taking 1 and 1/2 tablet daily  START Toprol XL 25 mg taking ONY 1/2 tablet daily  *If you need a refill on your cardiac medications before your next appointment, please call your pharmacy*   Lab Work: TODAY:  CMET & CBC = GO DOWN TO THE 1ST FLOOR, TAKE A LEFT OFF THE ELEVATOR.  GO DOWN THE HALL AND [facility] IS ON THE RIGHT  WHEN YOU SEE DR. Elwyn Lade, YOU WILL NEED ANOTHER BMET.  ASK THEM TO MAKE SURE YOU CAN GET THE LAB DONE THEN  If you have labs (blood work) drawn today and your tests are completely normal, you will receive your results only by: MyChart Message (if you have MyChart) OR A paper copy in the mail If you have any lab test that is abnormal or we need to change your treatment, we will call you to review the results.   Testing/Procedures: None ordered   Follow-Up: At [facility], you and your health needs are our priority.  As part of our continuing mission to provide you with exceptional heart care, we have created designated Provider Care Teams.  These Care Teams include your primary Cardiologist (physician) and Advanced Practice Providers (APPs -  Physician Assistants and Nurse Practitioners) who all work together to provide you with the care you need, when you need it.  We recommend signing up for the patient portal called "MyChart".  Sign up information is provided on this After Visit Summary.  MyChart is used to connect with patients for Virtual Visits (Telemedicine).  Patients are able to view lab/test results, encounter notes, upcoming appointments, etc.  Non-urgent messages can be sent to your provider as well.   To learn more about what you can do with MyChart, go to [email or web address].    Your next appointment:   3 month(s)  Provider:   Chilton Si, MD    Other Instructions

## 2023-05-23 NOTE — Assessment & Plan Note (Signed)
Recent SCr 1.76. CMET today. Repeat BMET in 2 weeks with increase in Lasix.

## 2023-05-23 NOTE — Assessment & Plan Note (Signed)
HR is elevated today (100-108 on my manual count). His coreg was DC'd in the hospital due to low BP. I think he can tolerate a small amount of Metoprolol succinate. He is not a candidate for anticoagulation due to hx of UGI bleeding, varices in the setting of cirrhosis, portal gastropathy.  -Start Metoprolol succinate 12.5 mg once daily  -I will review with Dr. Duke Salvia +/- referral to EP for LAAOD

## 2023-05-23 NOTE — Telephone Encounter (Signed)
No return call received from patient or his niece. Mailing letter to patient.

## 2023-05-23 NOTE — Assessment & Plan Note (Signed)
EF 55-60 by TTE 04/2023. He was recently admitted with a/c CHF and AFlutter. He required paracentesis x 2 and IV diuresis. Wt is up 6 lbs since DC. He is still volume overloaded. BP is on the low side. -Increase Lasix to 60 mg once daily  -Continue Spironolactone 50 mg once daily  -CMET, CBC today -Keep follow up with Dr. Elwyn Lade in CHF clinic in 2 weeks - get repeat BMET at that time. -Follow up Dr. Duke Salvia in 3 mos

## 2023-05-23 NOTE — Assessment & Plan Note (Signed)
Portal gastropathy with esophageal varices. Hx of UGI bleeding. He was recently admitted with a/c CHF w ascites s/p paracentesis x 2. 5 L was removed each time. He has follow up with GI soon.

## 2023-05-24 LAB — COMPREHENSIVE METABOLIC PANEL
ALT: 11 [IU]/L (ref 0–44)
AST: 19 [IU]/L (ref 0–40)
Albumin: 4 g/dL (ref 3.9–4.9)
Alkaline Phosphatase: 98 [IU]/L (ref 44–121)
BUN/Creatinine Ratio: 12 (ref 10–24)
BUN: 25 mg/dL (ref 8–27)
Bilirubin Total: 0.8 mg/dL (ref 0.0–1.2)
CO2: 24 mmol/L (ref 20–29)
Calcium: 8.9 mg/dL (ref 8.6–10.2)
Chloride: 102 mmol/L (ref 96–106)
Creatinine, Ser: 2.13 mg/dL — ABNORMAL HIGH (ref 0.76–1.27)
Globulin, Total: 3.3 g/dL (ref 1.5–4.5)
Glucose: 94 mg/dL (ref 70–99)
Potassium: 4.8 mmol/L (ref 3.5–5.2)
Sodium: 140 mmol/L (ref 134–144)
Total Protein: 7.3 g/dL (ref 6.0–8.5)
eGFR: 34 mL/min/{1.73_m2} — ABNORMAL LOW (ref >59)

## 2023-05-24 LAB — CBC
Hematocrit: 32.5 % — ABNORMAL LOW (ref 37.5–51.0)
Hemoglobin: 9.4 g/dL — ABNORMAL LOW (ref 13.0–17.7)
MCH: 24 pg — ABNORMAL LOW (ref 26.6–33.0)
MCHC: 28.9 g/dL — ABNORMAL LOW (ref 31.5–35.7)
MCV: 83 fL (ref 79–97)
Platelets: 124 10*3/uL — ABNORMAL LOW (ref 150–450)
RBC: 3.91 x10E6/uL — ABNORMAL LOW (ref 4.14–5.80)
RDW: 16.7 % — ABNORMAL HIGH (ref 11.6–15.4)
WBC: 5.1 10*3/uL (ref 3.4–10.8)

## 2023-05-27 ENCOUNTER — Telehealth: Payer: Self-pay | Admitting: *Deleted

## 2023-05-27 NOTE — Telephone Encounter (Signed)
Call placed to pt regarding being referred to EP.  No voicemail set up.  Referral has been placed.

## 2023-05-27 NOTE — Addendum Note (Signed)
Addended by: Burnetta Sabin on: 05/27/2023 08:16 AM   Modules accepted: Orders

## 2023-05-27 NOTE — Telephone Encounter (Signed)
-----   Message from Tereso Newcomer sent at 05/27/2023  7:19 AM EST ----- Victorino Dike Can you refer him to EP for Watchman? Let him know. I told him I would reach out to Dr. Duke Salvia to confirm. Thanks Acupuncturist ----- Message ----- From: Chilton Si, MD Sent: 05/23/2023   3:48 PM EST To: Beatrice Lecher, PA-C  I think that is a great idea. Please do.  ~Tiffany ----- Message ----- From: Kennon Rounds Sent: 05/23/2023   9:54 AM EST To: Chilton Si, MD  Tiffany - Do you want me to send him to EP for consideration of Watchman?

## 2023-06-02 ENCOUNTER — Encounter (HOSPITAL_COMMUNITY): Payer: Self-pay | Admitting: *Deleted

## 2023-06-03 NOTE — Progress Notes (Deleted)
 Chief Complaint:follow-up Primary GI Doctor: (previously Dr. Aneita) Dr. Wil[place]?  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 Aneita in GI office on 12/26/21 for follow-up of decompensated cirrhosis.  At that time he had CHF with volume overload.  BNP elevated at 433.5.  Potassium was 2.8.  Dr. Aneita continued current diuretics for management of cirrhosis, previously had ascites.  He ordered repeat BMP. Potassium normalized. Chronically elevated Creatinine level. Patient's reflux symptoms were controlled on omeprazole  40 mg p.o. daily. 11/13/2021 CTAP with cirrhosis and portal hypertension manifesting as splenomegaly and distal esophageal varices, increased bilateral flank edema, stable small umbilical hernia containing small bowel and aortic atherosclerosis 06/27/2020 EGD with grade 1 esophageal varices, benign-appearing esophageal stenosis, LA grade a reflux esophagitis, portal hypertensive gastropathy, gastric varices without bleeding a small hiatal hernia-recommended repeat EGD in 1 to 2 years for surveillance 03/02/2018 colonoscopy with one 5 mm polyp in transverse colon, four 7-8 mm polyps in the sigmoid colon, descending colon and transverse colon, lipoma in the transverse colon internal hemorrhoids-pathology showed tubular adenoma and serrated adenoma-repeat recommended in 3 years   Hospital course: On 05/07/2023 patient presented to ED with complaints of swelling in his legs and abdomen as well as shortness of breath with exertion after recent COVID infection.  BMP with normal electrolytes, creatinine 1.6.  White blood count of 3.4, stable anemia with hemoglobin 9.4, and thrombocytopenia with platelets at 95.  BNP 219.  Chest x-ray with mild vascular congestion.  Radiology  reports mild cardiomegaly.  EKG shows atrial fibrillation. Hospital course:  -Ultrasound paracentesis 05/09/2023 with 5.2 L of hazy, amber fluid removed -Echo 05/10/2023 done for A-fib with an LVEF 55-60% with mild left ventricular hypertrophy and aortic dilatation -Cardiology signed off 05/12/2023 recommended starting Lasix  and Aldactone  for cirrhotic ascites and hold Carvedilol  -Ultrasound-guided paracentesis 05/12/2023 with 5 L of fluid removed  05/13/23 GI services were asked to evaluate in the setting of previously documented cirrhosis with underlining preserved EF heart failure with medical noncompliance and diuretics.He now has atrial flutter and is felt to be a higher risk patient.   If however, he misses this appointment, he will be dismissed from the [place] [facility] practice for his multiple previously missed appointments. ***   Interval History  Patient admits/denies GERD Patient admits/denies dysphagia Patient admits/denies nausea, vomiting, or weight loss  Patient admits/denies altered bowel habits Patient admits/denies abdominal pain Patient admits/denies rectal bleeding   Denies/Admits alcohol Denies/Admits smoking Denies/Admits NSAID use. Denies/Admits they are on blood thinners.  Patients last colonoscopy Patients last EGD  Patient's family history includes  Wt Readings from Last 3 Encounters:  05/23/23 222 lb 9.6 oz (101 kg)  05/14/23 216 lb 4.3 oz (98.1 kg)  11/08/22 224 lb 13.9 oz (102 kg)      Past Medical History:  Diagnosis Date   Allergy    Arthritis    oa back   Asthma    Cataract    both eyes   CHF (congestive heart failure) (HCC) 2001   sees primary for   Chronic back pain    Chronic kidney disease    ckd 3   Cirrhosis (HCC)    alcoholic with h/o varices, ascites   Coronary artery disease    nonobstrucrtibe   ETOH  abuse    GERD (gastroesophageal reflux disease)    Gout    Hepatitis    hepatitic c 2018 24 week tx with ecuplipsa, no  hepatitic c detected after tx   History of blood transfusion 8-9- yrs ago   Hypertension    Optic neuropathy, left    PAD (peripheral artery disease) (HCC)    slight  primary manages   Polysubstance abuse (HCC)    Renal cell carcinoma (HCC) 2016, 2018 and 2022   left ablation   Seizures [facility])    age 52 none since   Sickle cell trait (HCC)    Stroke (HCC) 1998   light no problems with   Uses walker 12/04/2020   Wears dentures    Wears glasses     Past Surgical History:  Procedure Laterality Date   ARTHRODESIS METATARSALPHALANGEAL JOINT (MTPJ) Right 12/07/2020   Procedure: ARTHRODESIS METATARSALPHALANGEAL JOINT (MTPJ);  Surgeon: Janit Thresa HERO, DPM;  Location: [facility];  Service: Podiatry;  Laterality: Right;   COLONOSCOPY  2021   ESOPHAGOGASTRODUODENOSCOPY N/A 08/14/2014   Procedure: ESOPHAGOGASTRODUODENOSCOPY (EGD);  Surgeon: Gwendlyn ONEIDA Buddy, MD;  Location: [facility];  Service: Endoscopy;  Laterality: N/A;   ESOPHAGOGASTRODUODENOSCOPY (EGD) WITH PROPOFOL  N/A 06/03/2017   Procedure: ESOPHAGOGASTRODUODENOSCOPY (EGD) WITH PROPOFOL ;  Surgeon: Buddy Gwendlyn ONEIDA, MD;  Location: [facility];  Service: Endoscopy;  Laterality: N/A;   HALLUX FUSION Left 07/28/2020   Procedure: HALLUX FUSION MPJ;  Surgeon: Janit Thresa HERO, DPM;  Location: [facility] [place];  Service: Podiatry;  Laterality: Left;   HAMMER TOE SURGERY Left 07/28/2020   Procedure: HAMMER TOE CORRECTION  2,3,AND4 LEFT FOOT;  Surgeon: Janit Thresa HERO, DPM;  Location: [facility] [place];  Service: Podiatry;  Laterality: Left;   HAMMER TOE SURGERY Right 12/07/2020   Procedure: HAMMER TOE CORRECTION  2-4;  Surgeon: Janit Thresa HERO, DPM;  Location: [facility] [place];  Service: Podiatry;  Laterality: Right;   IR RADIOLOGIST EVAL & MGMT  09/25/2016   IR RADIOLOGIST EVAL & MGMT  12/10/2016   IR RADIOLOGIST EVAL & MGMT  03/25/2018   IR RADIOLOGIST EVAL & MGMT  03/24/2019   IR RADIOLOGIST EVAL  & MGMT  05/17/2020   IR RADIOLOGIST EVAL & MGMT  08/03/2020   IR RADIOLOGIST EVAL & MGMT  11/22/2020   IR RADIOLOGIST EVAL & MGMT  05/31/2021   METATARSAL HEAD EXCISION Left 07/28/2020   Procedure: METATARSAL HEAD EXCISION TOES 2,3,AND 4  LEFT FOOT;  Surgeon: Janit Thresa HERO, DPM;  Location: [facility];  Service: Podiatry;  Laterality: Left;   METATARSAL OSTEOTOMY Right 12/07/2020   Procedure: METATARSAL OSTEOTOMY TOES 2-4 RIGHT FOOT;  Surgeon: Janit Thresa HERO, DPM;  Location: [facility];  Service: Podiatry;  Laterality: Right;   none     RADIOFREQUENCY ABLATION Left 11/08/2016   Procedure: LEFT RENAL CRYOABLATION;  Surgeon: Vanice Sharper, MD;  Location: [facility];  Service: Anesthesiology;  Laterality: Left;   RADIOLOGY WITH ANESTHESIA N/A 08/15/2014   Procedure: RADIOLOGY WITH ANESTHESIA;  Surgeon: Sharper Vanice, MD;  [facility];  Service: Radiology;  Laterality: N/A;   RADIOLOGY WITH ANESTHESIA Left 07/05/2020   Procedure: CT CRYOABLATION;  Surgeon: Vanice Sharper, MD;  Location: [facility];  Service: Anesthesiology;  Laterality: Left;   UPPER GASTROINTESTINAL ENDOSCOPY      Current Outpatient Medications  Medication Sig Dispense Refill   acetaminophen  (TYLENOL ) 325 MG tablet Take 650 mg by mouth every 8 (eight) hours as needed  for moderate pain (NOT TO EXCEED 2,000 MG/DAY).     allopurinol  (ZYLOPRIM ) 100 MG tablet Take 1 tablet (100 mg total) by mouth daily. 30 tablet 0   atorvastatin  (LIPITOR) 40 MG tablet Take 40 mg by mouth daily.     brimonidine  (ALPHAGAN ) 0.15 % ophthalmic solution Place 1 drop into both eyes at bedtime.     ferrous sulfate  325 (65 FE) MG EC tablet Take 325 mg by mouth every Monday, Wednesday, and Friday.     FLOVENT  HFA 220 MCG/ACT inhaler Inhale 2 puffs into the lungs in the morning and at bedtime.     folic acid  (FOLVITE ) 1 MG tablet Take 1 tablet (1 mg total) by mouth daily. 30 tablet 0   furosemide  (LASIX ) 40 MG tablet Take 1.5  tablets (60 mg total) by mouth daily. 135 tablet 3   ketorolac  (ACULAR ) 0.5 % ophthalmic solution Place 1 drop into the right eye 4 (four) times daily.     lidocaine  (LIDODERM ) 5 % Place 1 patch onto the skin daily. Remove & Discard patch within 12 hours or as directed by MD 10 patch 0   melatonin 5 MG TABS Take 5 mg by mouth at bedtime.     Menthol -Methyl Salicylate  (SALONPAS  PAIN RELIEF  PATCH) PTCH Apply 1 patch topically daily as needed (for pain- affected area).     metoprolol  succinate (TOPROL  XL) 25 MG 24 hr tablet Take 0.5 tablets (12.5 mg total) by mouth daily. 45 tablet 3   ofloxacin (OCUFLOX) 0.3 % ophthalmic solution Place 1 drop into the right eye 4 (four) times daily.     omeprazole  (PRILOSEC) 40 MG capsule TAKE ONE CAPSULE BY MOUTH DAILY 30 capsule 5   potassium chloride  (KLOR-CON ) 10 MEQ tablet Take 10 mEq by mouth daily.     prednisoLONE acetate (PRED FORTE) 1 % ophthalmic suspension Place 1 drop into the right eye 4 (four) times daily.     PROAIR  HFA 108 (90 Base) MCG/ACT inhaler Inhale 2 puffs into the lungs every 6 (six) hours as needed for wheezing or shortness of breath.  1   spironolactone  (ALDACTONE ) 50 MG tablet Take 1 tablet (50 mg total) by mouth daily. 30 tablet 3   terbinafine (LAMISIL) 1 % cream Apply 1 Application topically See admin instructions. Apply to feet as directed every other day     thiamine  (VITAMIN B-1) 100 MG tablet Take 1 tablet (100 mg total) by mouth daily. 30 tablet 0   No current facility-administered medications for this visit.    Allergies as of 06/05/2023 - Review Complete 05/23/2023  Allergen Reaction Noted   Penicillins Other (See Comments) 12/16/2012    Family History  Problem Relation Age of Onset   Hypertension Mother        Living   Kidney disease Mother    Diabetes Mother    Heart disease Mother    Hypertension Father        Deceased, 56   Ulcers Father    Stomach cancer Father    Hypertension Brother    Diabetes Brother     Kidney disease Brother    Hypertension Sister    Colon cancer Neg Hx    Esophageal cancer Neg Hx    Rectal cancer Neg Hx     Review of Systems:    Constitutional: No weight loss, fever, chills, weakness or fatigue HEENT: Eyes: No change in vision               Ears, Nose,  Throat:  No change in hearing or congestion Skin: No rash or itching Cardiovascular: No chest pain, chest pressure or palpitations   Respiratory: No SOB or cough Gastrointestinal: See HPI and otherwise negative Genitourinary: No dysuria or change in urinary frequency Neurological: No headache, dizziness or syncope Musculoskeletal: No new muscle or joint pain Hematologic: No bleeding or bruising Psychiatric: No history of depression or anxiety    Physical Exam:  Vital signs: There were no vitals taken for this visit.  Constitutional:   Pleasant Caucasian male*** appears to be in NAD, Well developed, Well nourished, alert and cooperative Head:  Normocephalic and atraumatic. Eyes:   PEERL, EOMI. No icterus. Conjunctiva pink. Ears:  Normal auditory acuity. Neck:  Supple Throat: Oral cavity and pharynx without inflammation, swelling or lesion.  Respiratory: Respirations even and unlabored. Lungs clear to auscultation bilaterally.   No wheezes, crackles, or rhonchi.  Cardiovascular: Normal S1, S2. Regular rate and rhythm. No peripheral edema, cyanosis or pallor.  Gastrointestinal:  Soft, nondistended, nontender. No rebound or guarding. Normal bowel sounds. No appreciable masses or hepatomegaly. Rectal:  Not performed.  Anoscopy: Msk:  Symmetrical without gross deformities. Without edema, no deformity or joint abnormality.  Neurologic:  Alert and  oriented x4;  grossly normal neurologically.  Skin:   Dry and intact without significant lesions or rashes. Psychiatric: Oriented to person, place and time. Demonstrates good judgement and reason without abnormal affect or behaviors.  RELEVANT LABS AND  IMAGING: CBC    Latest Ref Rng & Units 05/23/2023   10:18 AM 05/14/2023    4:16 AM 05/13/2023    4:08 AM  CBC  WBC 3.4 - 10.8 x10E3/uL 5.1  5.3  4.9   Hemoglobin 13.0 - 17.7 g/dL 9.4  9.2  9.3   Hematocrit 37.5 - 51.0 % 32.5  28.9  29.9   Platelets 150 - 450 x10E3/uL 124  97  104      CMP     Latest Ref Rng & Units 05/23/2023   10:22 AM 05/23/2023   10:18 AM 05/14/2023    4:16 AM  CMP  Glucose 70 - 99 mg/dL 94  94  92   BUN 8 - 27 mg/dL 24  25  37   Creatinine 0.76 - 1.27 mg/dL 7.84  7.86  8.23   Sodium 134 - 144 mmol/L 140  140  135   Potassium 3.5 - 5.2 mmol/L 4.9  4.8  4.5   Chloride 96 - 106 mmol/L 102  102  104   CO2 20 - 29 mmol/L 24  24  22    Calcium  8.6 - 10.2 mg/dL 8.9  8.9  8.7   Total Protein 6.0 - 8.5 g/dL  7.3    Total Bilirubin 0.0 - 1.2 mg/dL  0.8    Alkaline Phos 44 - 121 IU/L  98    AST 0 - 40 IU/L  19    ALT 0 - 44 IU/L  11       Lab Results  Component Value Date   TSH 3.089 05/12/2023     Assessment: 1. ***  Plan: -abstain from substance use  -We can wait on upper endoscopy until he is more compensated,  -Patient is overdue for colonoscopy and EGD at this point.  Could consider pursuing EGD here for variceal surveillance given decompensation. Colonoscopy likely outpatient for screening.hospital-based outpatient setting.  -Started the patient back on Spironolactone  50 mg daily and Lasix  40 mg daily.  -Continue to monitor CBC/CMP daily  Thank you for the courtesy of this consult. Please call me with any questions or concerns.   Rowyn Mustapha, FNP-C [facility] 06/03/2023, 8:52 AM  Cc: [employer]*

## 2023-06-04 ENCOUNTER — Ambulatory Visit (HOSPITAL_COMMUNITY)
Admission: RE | Admit: 2023-06-04 | Discharge: 2023-06-04 | Disposition: A | Discharge status: Home / Self Care | Payer: 59 | Source: Ambulatory Visit | Attending: Internal Medicine | Admitting: Internal Medicine

## 2023-06-04 DIAGNOSIS — K7031 Alcoholic cirrhosis of liver with ascites: Secondary | ICD-10-CM | POA: Insufficient documentation

## 2023-06-04 MED ORDER — GADOBUTROL 1 MMOL/ML IV SOLN
10.0000 mL | Freq: Once | INTRAVENOUS | Status: AC | PRN
  Administered 2023-06-04: 10 mL via INTRAVENOUS

## 2023-06-05 ENCOUNTER — Telehealth (HOSPITAL_COMMUNITY): Payer: Self-pay | Admitting: Internal Medicine

## 2023-06-05 ENCOUNTER — Ambulatory Visit: Payer: 59 | Admitting: Gastroenterology

## 2023-06-06 ENCOUNTER — Encounter (HOSPITAL_COMMUNITY): Payer: Self-pay | Admitting: Cardiology

## 2023-06-06 ENCOUNTER — Ambulatory Visit (HOSPITAL_COMMUNITY)
Admit: 2023-06-06 | Discharge: 2023-06-06 | Disposition: A | Discharge status: Home / Self Care | Payer: 59 | Source: Ambulatory Visit | Attending: Cardiology | Admitting: Cardiology

## 2023-06-06 VITALS — BP 134/90 | HR 115 | Ht 67.0 in | Wt 232.6 lb

## 2023-06-06 DIAGNOSIS — I130 Hypertensive heart and chronic kidney disease with heart failure and stage 1 through stage 4 chronic kidney disease, or unspecified chronic kidney disease: Secondary | ICD-10-CM | POA: Insufficient documentation

## 2023-06-06 DIAGNOSIS — N189 Chronic kidney disease, unspecified: Secondary | ICD-10-CM | POA: Insufficient documentation

## 2023-06-06 DIAGNOSIS — I4819 Other persistent atrial fibrillation: Secondary | ICD-10-CM | POA: Diagnosis not present

## 2023-06-06 DIAGNOSIS — I5032 Chronic diastolic (congestive) heart failure: Secondary | ICD-10-CM | POA: Diagnosis present

## 2023-06-06 DIAGNOSIS — K7030 Alcoholic cirrhosis of liver without ascites: Secondary | ICD-10-CM | POA: Diagnosis not present

## 2023-06-06 DIAGNOSIS — I429 Cardiomyopathy, unspecified: Secondary | ICD-10-CM | POA: Diagnosis not present

## 2023-06-06 DIAGNOSIS — K7031 Alcoholic cirrhosis of liver with ascites: Secondary | ICD-10-CM

## 2023-06-06 DIAGNOSIS — N1832 Chronic kidney disease, stage 3b: Secondary | ICD-10-CM | POA: Diagnosis not present

## 2023-06-06 DIAGNOSIS — I4892 Unspecified atrial flutter: Secondary | ICD-10-CM | POA: Insufficient documentation

## 2023-06-06 DIAGNOSIS — Z79899 Other long term (current) drug therapy: Secondary | ICD-10-CM | POA: Diagnosis not present

## 2023-06-06 NOTE — Patient Instructions (Signed)
 Medication Changes:  No Changes In Medications at this time.   Follow-Up in: as needed with Dr. Zenaida- number to the office is [phone] opt 2   At the [facility], you and your health needs are our priority. We have a designated team specialized in the treatment of Heart Failure. This Care Team includes your primary Heart Failure Specialized Cardiologist (physician), Advanced Practice Providers (APPs- Physician Assistants and Nurse Practitioners), and Pharmacist who all work together to provide you with the care you need, when you need it.   You may see any of the following providers on your designated Care Team at your next follow up:  Dr. Toribio Fuel Dr. Ezra Shuck Dr. Ria Commander Dr. Odis Zenaida Greig Mosses, NP Caffie Shed, [name] [facility] [place], [name] Beckey Coe, NP Jordan Lee, NP Tinnie Redman, PharmD   Please be sure to bring in all your medications bottles to every appointment.   Need to Contact Us :  If you have any questions or concerns before your next appointment please send us  a message through [facility] or call our office at [phone].    TO LEAVE A MESSAGE FOR THE NURSE SELECT OPTION 2, PLEASE LEAVE A MESSAGE INCLUDING: YOUR NAME DATE OF BIRTH CALL BACK NUMBER REASON FOR CALL**this is important as we prioritize the call backs  YOU WILL RECEIVE A CALL BACK THE SAME DAY AS LONG AS YOU CALL BEFORE 4:00 PM

## 2023-06-07 ENCOUNTER — Other Ambulatory Visit (HOSPITAL_COMMUNITY): Payer: Self-pay

## 2023-06-09 NOTE — Progress Notes (Signed)
   ADVANCED HEART FAILURE NEW PATIENT CLINIC NOTE  Referring Physician: Shlomo Wilbert SAUNDERS, MD  Primary Care: Patient, No Pcp Per Primary Cardiologist:  HPI: John [name] is a 68 y.o. male with a PMH of heart failure with preserved ejection fraction, atrial fibrillation/flutter, hypertension, CKD, alcoholic cirrhosis who presents for initial visit for further evaluation and treatment of heart failure/cardiomyopathy.      (HFpEF) heart failure with preserved ejection fraction  TTE 05/10/2023: EF 55-60, no RWMA, mild LVH, low normal RVSF, normal PASP, moderate LAE, mild MR, moderate dilation of aortic root (44 mm), mild dilation of ascending aorta (42 mm) Persistent atrial fibrillation/flutter Not a candidate for anticoagulation (esoph varices, hx of UGI bleed) Dilated aorta TTE 08/01/2022: Aortic root 41 mm, ascending aorta 40 mm TTE 05/10/2023: Aortic root 44 mm, ascending aorta 42 mm Hypertension  Chronic kidney disease  Chronic Obstructive Pulmonary Disease  Renal cell CA s/p cryoablation  Hep C  Alcoholic Cirrhosis  Esophageal varices Hx of UGI bleed  Seizures Polysubstance abuse / Cocaine     SUBJECTIVE: Patient reports that overall he is doing well.  His breathing he feels is at its baseline on his current dose of Lasix .  He takes the 60 mg daily and occasionally takes a second dose in the afternoon.  He feels some mild abdominal distention but no significant change in his appetite.  Denies any recent bleeding issues.  Does not feel any palpitations or chest pain.  PMH, current medications, allergies, social history, and family history reviewed in epic.  PHYSICAL EXAM: Vitals:   06/06/23 1428  BP: (!) 134/90  Pulse: (!) 115  SpO2: 95%   GENERAL: Chronically ill-appearing PULM: Normal work of breathing CARDIAC:  JVP: Mildly elevated JVP, tachycardic with a regular rhythm, no M/G/R, 1+ lower extremity edema ABDOMEN: Mildly distended NEUROLOGIC: Patient is oriented  x3 with no focal or lateralizing neurologic deficits.  PSYCH: Patients affect is appropriate, there is no evidence of anxiety or depression.  SKIN: Warm and dry; no lesions or wounds. Warm and well perfused extremities.    ASSESSMENT & PLAN:  Chronic heart failure with preserved EF: Patient with multiple medical comorbidities, poor history with compliance and recent admission for volume overload.  Currently in atrial fibrillation with controlled rates, medical therapy limited by known cirrhosis and CKD.  Volume status appears to be improving, discussed self titration of diuretic medications at length. -Lasix  60 mg daily with twice daily dosing as needed -Continue spironolactone  50 mg daily -Recent labs reviewed and within normal limits -Continue potassium 10 mill equivalents daily -Continue metoprolol  12.5 mg daily -Unlikely to benefit from further advanced heart failure evaluation, follow-up as needed  Cirrhosis: Alcoholic cirrhosis with known grade 1 varices and recent paracentesis.  Abdominal exam is benign currently. -Limits medication titration as above -Contributing to volume overload -Patient is abstaining from alcohol  CKD: Stable on most recent lab work -Continue to follow  Ascending aorta dilation: -Serial echocardiograms  Atrial flutter/fibrillation: Anticoagulation limited given history of varices.  Appear to be grade 1 on most recent endoscope which was sometime ago.  If stable on surveillance could consider restarting anticoagulation and considering rhythm control at that time. -Follow-up with GI prior to any planned rhythm control intervention  Morene Brownie, MD Advanced Heart Failure Mechanical Circulatory Support 06/09/23

## 2023-06-11 ENCOUNTER — Encounter: Payer: Self-pay | Admitting: *Deleted

## 2023-06-17 ENCOUNTER — Telehealth: Payer: Self-pay

## 2023-06-17 NOTE — Telephone Encounter (Addendum)
 Per note on chart, called niece Kolson Chovanec [phone]) to schedule Watchman consult. Shamekia requested to call back tomorrow as she needs her work calendar to schedule and she is off today. She will call once at work and has calendar.

## 2023-06-23 NOTE — Telephone Encounter (Signed)
 Left message to call back

## 2023-06-30 ENCOUNTER — Encounter (HOSPITAL_COMMUNITY): Payer: Self-pay

## 2023-06-30 ENCOUNTER — Emergency Department (HOSPITAL_COMMUNITY)

## 2023-06-30 ENCOUNTER — Encounter (HOSPITAL_COMMUNITY): Payer: Self-pay | Admitting: *Deleted

## 2023-06-30 ENCOUNTER — Other Ambulatory Visit: Payer: Self-pay

## 2023-06-30 ENCOUNTER — Inpatient Hospital Stay (HOSPITAL_COMMUNITY)
Admission: EM | Admit: 2023-06-30 | Discharge: 2023-07-08 | DRG: 291 | Disposition: A | Discharge status: Skilled Nursing Facility | Attending: Family Medicine | Admitting: Family Medicine

## 2023-06-30 DIAGNOSIS — Z8249 Family history of ischemic heart disease and other diseases of the circulatory system: Secondary | ICD-10-CM

## 2023-06-30 DIAGNOSIS — J4489 Other specified chronic obstructive pulmonary disease: Secondary | ICD-10-CM | POA: Diagnosis present

## 2023-06-30 DIAGNOSIS — D696 Thrombocytopenia, unspecified: Secondary | ICD-10-CM | POA: Diagnosis not present

## 2023-06-30 DIAGNOSIS — I2510 Atherosclerotic heart disease of native coronary artery without angina pectoris: Secondary | ICD-10-CM | POA: Diagnosis present

## 2023-06-30 DIAGNOSIS — I679 Cerebrovascular disease, unspecified: Secondary | ICD-10-CM | POA: Diagnosis present

## 2023-06-30 DIAGNOSIS — N1831 Chronic kidney disease, stage 3a: Secondary | ICD-10-CM | POA: Diagnosis not present

## 2023-06-30 DIAGNOSIS — D649 Anemia, unspecified: Secondary | ICD-10-CM

## 2023-06-30 DIAGNOSIS — I4891 Unspecified atrial fibrillation: Secondary | ICD-10-CM | POA: Diagnosis present

## 2023-06-30 DIAGNOSIS — I8510 Secondary esophageal varices without bleeding: Secondary | ICD-10-CM | POA: Diagnosis present

## 2023-06-30 DIAGNOSIS — I130 Hypertensive heart and chronic kidney disease with heart failure and stage 1 through stage 4 chronic kidney disease, or unspecified chronic kidney disease: Secondary | ICD-10-CM | POA: Diagnosis present

## 2023-06-30 DIAGNOSIS — Z841 Family history of disorders of kidney and ureter: Secondary | ICD-10-CM

## 2023-06-30 DIAGNOSIS — M109 Gout, unspecified: Secondary | ICD-10-CM | POA: Diagnosis present

## 2023-06-30 DIAGNOSIS — I739 Peripheral vascular disease, unspecified: Secondary | ICD-10-CM | POA: Diagnosis present

## 2023-06-30 DIAGNOSIS — Z7951 Long term (current) use of inhaled steroids: Secondary | ICD-10-CM

## 2023-06-30 DIAGNOSIS — Z880 Allergy status to penicillin: Secondary | ICD-10-CM

## 2023-06-30 DIAGNOSIS — D573 Sickle-cell trait: Secondary | ICD-10-CM | POA: Diagnosis present

## 2023-06-30 DIAGNOSIS — E785 Hyperlipidemia, unspecified: Secondary | ICD-10-CM | POA: Diagnosis present

## 2023-06-30 DIAGNOSIS — I4892 Unspecified atrial flutter: Secondary | ICD-10-CM | POA: Diagnosis present

## 2023-06-30 DIAGNOSIS — Z91199 Patient's noncompliance with other medical treatment and regimen due to unspecified reason: Secondary | ICD-10-CM

## 2023-06-30 DIAGNOSIS — F1011 Alcohol abuse, in remission: Secondary | ICD-10-CM | POA: Diagnosis present

## 2023-06-30 DIAGNOSIS — G40909 Epilepsy, unspecified, not intractable, without status epilepticus: Secondary | ICD-10-CM | POA: Diagnosis present

## 2023-06-30 DIAGNOSIS — Z833 Family history of diabetes mellitus: Secondary | ICD-10-CM

## 2023-06-30 DIAGNOSIS — Z91148 Patient's other noncompliance with medication regimen for other reason: Secondary | ICD-10-CM

## 2023-06-30 DIAGNOSIS — N3000 Acute cystitis without hematuria: Secondary | ICD-10-CM | POA: Diagnosis present

## 2023-06-30 DIAGNOSIS — Z6836 Body mass index (BMI) 36.0-36.9, adult: Secondary | ICD-10-CM

## 2023-06-30 DIAGNOSIS — Z85528 Personal history of other malignant neoplasm of kidney: Secondary | ICD-10-CM

## 2023-06-30 DIAGNOSIS — N1832 Chronic kidney disease, stage 3b: Secondary | ICD-10-CM | POA: Diagnosis present

## 2023-06-30 DIAGNOSIS — Z8673 Personal history of transient ischemic attack (TIA), and cerebral infarction without residual deficits: Secondary | ICD-10-CM

## 2023-06-30 DIAGNOSIS — Z981 Arthrodesis status: Secondary | ICD-10-CM

## 2023-06-30 DIAGNOSIS — K7031 Alcoholic cirrhosis of liver with ascites: Secondary | ICD-10-CM | POA: Diagnosis present

## 2023-06-30 DIAGNOSIS — I509 Heart failure, unspecified: Secondary | ICD-10-CM

## 2023-06-30 DIAGNOSIS — E66812 Obesity, class 2: Secondary | ICD-10-CM | POA: Diagnosis present

## 2023-06-30 DIAGNOSIS — D631 Anemia in chronic kidney disease: Secondary | ICD-10-CM | POA: Diagnosis present

## 2023-06-30 DIAGNOSIS — D6959 Other secondary thrombocytopenia: Secondary | ICD-10-CM | POA: Diagnosis present

## 2023-06-30 DIAGNOSIS — R600 Localized edema: Principal | ICD-10-CM

## 2023-06-30 DIAGNOSIS — Z79899 Other long term (current) drug therapy: Secondary | ICD-10-CM

## 2023-06-30 DIAGNOSIS — Z800 Family history of malignant neoplasm of digestive organs: Secondary | ICD-10-CM

## 2023-06-30 DIAGNOSIS — Z87891 Personal history of nicotine dependence: Secondary | ICD-10-CM

## 2023-06-30 DIAGNOSIS — I5033 Acute on chronic diastolic (congestive) heart failure: Secondary | ICD-10-CM | POA: Diagnosis present

## 2023-06-30 DIAGNOSIS — K219 Gastro-esophageal reflux disease without esophagitis: Secondary | ICD-10-CM | POA: Diagnosis present

## 2023-06-30 DIAGNOSIS — K7290 Hepatic failure, unspecified without coma: Secondary | ICD-10-CM | POA: Diagnosis not present

## 2023-06-30 DIAGNOSIS — K7460 Unspecified cirrhosis of liver: Secondary | ICD-10-CM | POA: Diagnosis not present

## 2023-06-30 DIAGNOSIS — B9620 Unspecified Escherichia coli [E. coli] as the cause of diseases classified elsewhere: Secondary | ICD-10-CM | POA: Diagnosis present

## 2023-06-30 LAB — CBC
HCT: 30.4 % — ABNORMAL LOW (ref 39.0–52.0)
Hemoglobin: 9.3 g/dL — ABNORMAL LOW (ref 13.0–17.0)
MCH: 24.7 pg — ABNORMAL LOW (ref 26.0–34.0)
MCHC: 30.6 g/dL (ref 30.0–36.0)
MCV: 80.6 fL (ref 80.0–100.0)
Platelets: 116 10*3/uL — ABNORMAL LOW (ref 150–400)
RBC: 3.77 MIL/uL — ABNORMAL LOW (ref 4.22–5.81)
RDW: 18.5 % — ABNORMAL HIGH (ref 11.5–15.5)
WBC: 5.1 10*3/uL (ref 4.0–10.5)
nRBC: 0 % (ref 0.0–0.2)

## 2023-06-30 LAB — COMPREHENSIVE METABOLIC PANEL
ALT: 9 U/L (ref 0–44)
AST: 17 U/L (ref 15–41)
Albumin: 3.4 g/dL — ABNORMAL LOW (ref 3.5–5.0)
Alkaline Phosphatase: 55 U/L (ref 38–126)
Anion gap: 9 (ref 5–15)
BUN: 18 mg/dL (ref 8–23)
CO2: 24 mmol/L (ref 22–32)
Calcium: 9 mg/dL (ref 8.9–10.3)
Chloride: 105 mmol/L (ref 98–111)
Creatinine, Ser: 1.79 mg/dL — ABNORMAL HIGH (ref 0.61–1.24)
GFR, Estimated: 41 mL/min — ABNORMAL LOW (ref >60)
Glucose, Bld: 89 mg/dL (ref 70–99)
Potassium: 3.7 mmol/L (ref 3.5–5.1)
Sodium: 138 mmol/L (ref 135–145)
Total Bilirubin: 1.2 mg/dL (ref 0.0–1.2)
Total Protein: 7.3 g/dL (ref 6.5–8.1)

## 2023-06-30 LAB — URINALYSIS, ROUTINE W REFLEX MICROSCOPIC
Bilirubin Urine: NEGATIVE
Glucose, UA: NEGATIVE mg/dL
Ketones, ur: NEGATIVE mg/dL
Nitrite: NEGATIVE
Protein, ur: NEGATIVE mg/dL
Specific Gravity, Urine: 1.012 (ref 1.005–1.030)
WBC, UA: >50 WBC/hpf (ref 0–5)
pH: 5 (ref 5.0–8.0)

## 2023-06-30 LAB — BRAIN NATRIURETIC PEPTIDE: B Natriuretic Peptide: 199.2 pg/mL — ABNORMAL HIGH (ref 0.0–100.0)

## 2023-06-30 LAB — PROTIME-INR
INR: 1.3 — ABNORMAL HIGH (ref 0.8–1.2)
Prothrombin Time: 16.6 s — ABNORMAL HIGH (ref 11.4–15.2)

## 2023-06-30 LAB — LIPASE, BLOOD: Lipase: 31 U/L (ref 11–51)

## 2023-06-30 MED ORDER — FUROSEMIDE 10 MG/ML IJ SOLN
60.0000 mg | Freq: Once | INTRAMUSCULAR | Status: AC
  Administered 2023-06-30: 60 mg via INTRAVENOUS
  Filled 2023-06-30: qty 8

## 2023-06-30 NOTE — ED Provider Notes (Signed)
 [facility] EMERGENCY DEPARTMENT AT [facility] Provider Note   CSN: [id number] Arrival date & time: 06/30/23  7829     History  Chief Complaint  Patient presents with  . Leg Swelling    John [name] is a 68 y.o. male.  Patient is a 68 year old male with a past medical history of CHF, cirrhosis, A-fib presenting to the emergency department with lower extremity and abdominal swelling.  The patient states that he recently ran out of his Lasix and has had increased swelling in his legs and his abdomen over the last 2 days.  He states that the swelling has been getting so bad that he is unable to walk.  He states that his abdomen feels bloated but denies any abdominal pain.  Denies any fevers, nausea or vomiting.  He denies any chest pain.  Reports some mild shortness of breath.  The history is provided by the patient.       Home Medications Prior to Admission medications   Medication Sig Start Date End Date Taking? Authorizing Provider  acetaminophen (TYLENOL) 325 MG tablet Take 650 mg by mouth every 8 (eight) hours as needed for moderate pain (NOT TO EXCEED 2,000 MG/DAY).    [provider]  allopurinol (ZYLOPRIM) 100 MG tablet Take 1 tablet (100 mg total) by mouth daily. 05/12/18   Rodolph Bong, MD  atorvastatin (LIPITOR) 40 MG tablet Take 40 mg by mouth daily. 04/29/19   [provider]  brimonidine (ALPHAGAN) 0.15 % ophthalmic solution Place 1 drop into both eyes at bedtime.    [provider]  ferrous sulfate 325 (65 FE) MG EC tablet Take 325 mg by mouth every Monday, Wednesday, and Friday.    [provider]  FLOVENT HFA 220 MCG/ACT inhaler Inhale 2 puffs into the lungs in the morning and at bedtime. 01/27/19   [provider]  folic acid (FOLVITE) 1 MG tablet Take 1 tablet (1 mg total) by mouth daily. 05/15/23   Osvaldo Shipper, MD  furosemide (LASIX) 40 MG tablet Take 1.5 tablets (60 mg total) by mouth daily.  05/23/23 08/21/23  Tereso Newcomer T, PA-C  ketorolac (ACULAR) 0.5 % ophthalmic solution Place 1 drop into the right eye 4 (four) times daily. 03/20/23   [provider]  lidocaine (LIDODERM) 5 % Place 1 patch onto the skin daily. Remove & Discard patch within 12 hours or as directed by MD 11/08/22   Linwood Dibbles, MD  melatonin 5 MG TABS Take 5 mg by mouth at bedtime.    [provider]  Menthol-Methyl Salicylate (SALONPAS PAIN RELIEF PATCH) PTCH Apply 1 patch topically daily as needed (for pain- affected area).    [provider]  metoprolol succinate (TOPROL XL) 25 MG 24 hr tablet Take 0.5 tablets (12.5 mg total) by mouth daily. 05/23/23   Tereso Newcomer T, PA-C  ofloxacin (OCUFLOX) 0.3 % ophthalmic solution Place 1 drop into the right eye 4 (four) times daily. 03/20/23   [provider]  omeprazole (PRILOSEC) 40 MG capsule TAKE ONE CAPSULE BY MOUTH DAILY 07/02/18   Meryl Dare, MD  potassium chloride (KLOR-CON) 10 MEQ tablet Take 10 mEq by mouth daily.    [provider]  prednisoLONE acetate (PRED FORTE) 1 % ophthalmic suspension Place 1 drop into the right eye 4 (four) times daily. 03/20/23   [provider]  PROAIR HFA 108 (90 Base) MCG/ACT inhaler Inhale 2 puffs into the lungs every 6 (six) hours as needed  for wheezing or shortness of breath. 10/02/17   [provider]  spironolactone (ALDACTONE) 50 MG tablet Take 1 tablet (50 mg total) by mouth daily. 05/14/23   Osvaldo Shipper, MD  terbinafine (LAMISIL) 1 % cream Apply 1 Application topically See admin instructions. Apply to feet as directed every other day    [provider]  thiamine (VITAMIN B-1) 100 MG tablet Take 1 tablet (100 mg total) by mouth daily. 05/15/23   Osvaldo Shipper, MD      Allergies    Penicillins    Review of Systems   Review of Systems  Physical Exam Updated Vital Signs BP 134/85   Pulse 86   Temp 98.4 F (36.9 C) (Oral)   Resp 18   Ht 5\' 7"   (1.702 m)   Wt 105.2 kg   SpO2 100%   BMI 36.34 kg/m  Physical Exam Vitals and nursing note reviewed.  Constitutional:      General: He is not in acute distress.    Appearance: Normal appearance.  HENT:     Head: Normocephalic and atraumatic.     Nose: Nose normal.     Mouth/Throat:     Mouth: Mucous membranes are moist.     Pharynx: Oropharynx is clear.  Eyes:     Extraocular Movements: Extraocular movements intact.     Conjunctiva/sclera: Conjunctivae normal.  Cardiovascular:     Rate and Rhythm: Normal rate and regular rhythm.     Heart sounds: Normal heart sounds.  Pulmonary:     Effort: Pulmonary effort is normal.     Breath sounds: Normal breath sounds.  Abdominal:     General: There is distension.     Tenderness: There is abdominal tenderness (mild diffuse). There is no guarding or rebound.  Musculoskeletal:        General: Normal range of motion.     Cervical back: Normal range of motion.     Right lower leg: Edema (3+ to abdomen) present.     Left lower leg: Edema present.  Skin:    General: Skin is warm and dry.  Neurological:     General: No focal deficit present.     Mental Status: He is alert and oriented to person, place, and time.  Psychiatric:        Mood and Affect: Mood normal.        Behavior: Behavior normal.     ED Results / Procedures / Treatments   Labs (all labs ordered are listed, but only abnormal results are displayed) Labs Reviewed  COMPREHENSIVE METABOLIC PANEL - Abnormal; Notable for the following components:      Result Value   Creatinine, Ser 1.79 (*)    Albumin 3.4 (*)    GFR, Estimated 41 (*)    All other components within normal limits  CBC - Abnormal; Notable for the following components:   RBC 3.77 (*)    Hemoglobin 9.3 (*)    HCT 30.4 (*)    MCH 24.7 (*)    RDW 18.5 (*)    Platelets 116 (*)    All other components within normal limits  BRAIN NATRIURETIC PEPTIDE - Abnormal; Notable for the following components:   B  Natriuretic Peptide 199.2 (*)    All other components within normal limits  LIPASE, BLOOD  URINALYSIS, ROUTINE W REFLEX MICROSCOPIC  PROTIME-INR    EKG EKG Interpretation Date/Time:  Monday June 30 2023 16:56:21 EST Ventricular Rate:  88 PR Interval:  185 QRS Duration:  88 QT Interval:  379 QTC Calculation: 459 R Axis:   59  Text Interpretation: Atrial flutter Nonspecific T abnormalities, diffuse leads No significant change since last tracing Confirmed by Elayne Snare ([id number]) on 06/30/2023 8:44:03 PM  Radiology DG Chest 2 View Result Date: 06/30/2023 CLINICAL DATA:  Shortness of breath and bilateral leg swelling EXAM: CHEST - 2 VIEW COMPARISON:  12/17/2023 FINDINGS: Stable cardiomegaly. Aortic atherosclerotic calcification. Vascular congestion and vague hazy airspace opacities bilaterally. Trace bilateral pleural effusions. No pneumothorax. Embolization coils left upper quadrant. IMPRESSION: Findings suggestive of CHF with pulmonary edema. Electronically Signed   By: Minerva Fester M.D.   On: 06/30/2023 21:24    Procedures Ultrasound ED Abd  Date/Time: 06/30/2023 9:26 PM  Performed by: Rexford Maus, DO Authorized by: Rexford Maus, DO   Procedure details:    Indications: abdominal pain     Assessment for:  Intra-abdominal fluid   Right renal:  Visualized    Images: archived   Study Limitations: body habitus Right renal findings:    Intra-abdominal fluid: identified   Comments:     Significant ascites but exam significantly limited due to habitus and abdominal wall edema     Medications Ordered in ED Medications  furosemide (LASIX) injection 60 mg (has no administration in time range)    ED Course/ Medical Decision Making/ A&P Clinical Course as of 06/30/23 2303  Mon Jun 30, 2023  2303 Patient signed out to Dr. Loney Loh for admission. [VK]    Clinical Course User Index [VK] Rexford Maus, DO                                 Medical  Decision Making This patient presents to the ED with chief complaint(s) of LE/abd swelling with pertinent past medical history of CHF, cirrhosis, CKD which further complicates the presenting complaint. The complaint involves an extensive differential diagnosis and also carries with it a high risk of complications and morbidity.    The differential diagnosis includes CHF exacerbation, cirrhosis, ascites, no fever or significant abdominal pain making an SBP less likely, renal dysfunction, liver dysfunction  Additional history obtained: Additional history obtained from N/A Records reviewed previous admission documents  ED Course and Reassessment: On patient's arrival he is hemodynamically dynamically stable in no acute distress.  Was initially evaluated by provider in triage and had EKG, labs and chest x-ray performed.  EKG had no acute ischemic changes, labs showed mildly elevated BNP otherwise labs are at baseline.  Chest x-ray is pending read but no significant abnormality on my evaluation.  Patient does appear significantly volume overloaded.  I did additionally perform a bedside ultrasound that was positive for ascites, due to his significant abdominal wall edema views were limited though and do not feel comfortable going forward with a bedside paracentesis at this time.  Patient was recommended admission for diuresis and paracentesis.  Independent labs interpretation:  The following labs were independently interpreted: Mildly elevated BNP otherwise at baseline  Independent visualization of imaging: - I independently visualized the following imaging with scope of interpretation limited to determining acute life threatening conditions related to emergency care: Chest x-ray, which revealed no acute findings  Consultation: - Consulted or discussed management/test interpretation w/ external professional: Hospitalists  Consideration for admission or further workup: Patient requires admission for  volume overload Social Determinants of health: N/A    Amount and/or Complexity of Data Reviewed Labs: ordered.  Risk  Prescription drug management.          Final Clinical Impression(s) / ED Diagnoses Final diagnoses:  None    Rx / DC Orders ED Discharge Orders     None         Rexford Maus, DO 06/30/23 2127

## 2023-06-30 NOTE — ED Triage Notes (Signed)
 Pt BIB [facility] from home for 2 days of BIL LE swelling extending into the abdomen. Pt has been out of his lasix for 1 week.

## 2023-06-30 NOTE — ED Provider Triage Note (Signed)
 Emergency Medicine Provider Triage Evaluation Note  John [name] , a 68 y.o. male  was evaluated in triage.  Pt complains of bilateral leg swelling.  Started about 4 days ago.  Also endorsing some shortness of breath but no chest pain.  States he has been out of Lasix for a week now.  History of CHF.  Review of Systems  Positive: See above Negative: See above  Physical Exam  BP (!) 120/97 (BP Location: Right Arm)   Pulse 88   Temp 98.3 F (36.8 C) (Oral)   Resp 18   Ht 5\' 7"  (1.702 m)   Wt 105.2 kg   SpO2 98%   BMI 36.34 kg/m  Gen:   Awake, no distress   Resp:  Normal effort  MSK:   Moves extremities without difficulty  Other:    Medical Decision Making  Medically screening exam initiated at 5:02 PM.  Appropriate orders placed.  Nathaneil Canary was informed that the remainder of the evaluation will be completed by another provider, this initial triage assessment does not replace that evaluation, and the importance of remaining in the ED until their evaluation is complete.  Work up started   [employer], PA-C 06/30/23 [phone]

## 2023-07-01 ENCOUNTER — Inpatient Hospital Stay (HOSPITAL_COMMUNITY)

## 2023-07-01 DIAGNOSIS — Z91199 Patient's noncompliance with other medical treatment and regimen due to unspecified reason: Secondary | ICD-10-CM

## 2023-07-01 DIAGNOSIS — I5033 Acute on chronic diastolic (congestive) heart failure: Secondary | ICD-10-CM | POA: Diagnosis not present

## 2023-07-01 DIAGNOSIS — K7290 Hepatic failure, unspecified without coma: Secondary | ICD-10-CM

## 2023-07-01 DIAGNOSIS — D649 Anemia, unspecified: Secondary | ICD-10-CM

## 2023-07-01 DIAGNOSIS — K7460 Unspecified cirrhosis of liver: Secondary | ICD-10-CM

## 2023-07-01 LAB — BODY FLUID CELL COUNT WITH DIFFERENTIAL
Eos, Fluid: 0 %
Lymphs, Fluid: 34 %
Monocyte-Macrophage-Serous Fluid: 51 % (ref 50–90)
Neutrophil Count, Fluid: 15 % (ref 0–25)
Total Nucleated Cell Count, Fluid: 154 uL (ref 0–1000)

## 2023-07-01 LAB — BASIC METABOLIC PANEL
Anion gap: 11 (ref 5–15)
BUN: 19 mg/dL (ref 8–23)
CO2: 27 mmol/L (ref 22–32)
Calcium: 8.7 mg/dL — ABNORMAL LOW (ref 8.9–10.3)
Chloride: 103 mmol/L (ref 98–111)
Creatinine, Ser: 1.76 mg/dL — ABNORMAL HIGH (ref 0.61–1.24)
GFR, Estimated: 42 mL/min — ABNORMAL LOW (ref >60)
Glucose, Bld: 98 mg/dL (ref 70–99)
Potassium: 3.5 mmol/L (ref 3.5–5.1)
Sodium: 141 mmol/L (ref 135–145)

## 2023-07-01 LAB — GLUCOSE, PLEURAL OR PERITONEAL FLUID: Glucose, Fluid: 97 mg/dL

## 2023-07-01 LAB — PROTEIN, PLEURAL OR PERITONEAL FLUID: Total protein, fluid: 3.3 g/dL

## 2023-07-01 LAB — CBC
HCT: 28.1 % — ABNORMAL LOW (ref 39.0–52.0)
Hemoglobin: 9 g/dL — ABNORMAL LOW (ref 13.0–17.0)
MCH: 25.9 pg — ABNORMAL LOW (ref 26.0–34.0)
MCHC: 32 g/dL (ref 30.0–36.0)
MCV: 80.7 fL (ref 80.0–100.0)
Platelets: 104 10*3/uL — ABNORMAL LOW (ref 150–400)
RBC: 3.48 MIL/uL — ABNORMAL LOW (ref 4.22–5.81)
RDW: 18.5 % — ABNORMAL HIGH (ref 11.5–15.5)
WBC: 4.9 10*3/uL (ref 4.0–10.5)
nRBC: 0 % (ref 0.0–0.2)

## 2023-07-01 LAB — LACTATE DEHYDROGENASE, PLEURAL OR PERITONEAL FLUID: LD, Fluid: 68 U/L — ABNORMAL HIGH (ref 3–23)

## 2023-07-01 MED ORDER — ENSURE ENLIVE PO LIQD
237.0000 mL | Freq: Two times a day (BID) | ORAL | Status: DC
  Administered 2023-07-01 – 2023-07-08 (×7): 237 mL via ORAL

## 2023-07-01 MED ORDER — PANTOPRAZOLE SODIUM 40 MG PO TBEC
80.0000 mg | DELAYED_RELEASE_TABLET | Freq: Every day | ORAL | Status: DC
  Administered 2023-07-01 – 2023-07-08 (×8): 80 mg via ORAL
  Filled 2023-07-01 (×8): qty 2

## 2023-07-01 MED ORDER — ATORVASTATIN CALCIUM 40 MG PO TABS
40.0000 mg | ORAL_TABLET | Freq: Every day | ORAL | Status: DC
  Administered 2023-07-02 – 2023-07-08 (×7): 40 mg via ORAL
  Filled 2023-07-01 (×7): qty 1

## 2023-07-01 MED ORDER — ALLOPURINOL 100 MG PO TABS
100.0000 mg | ORAL_TABLET | Freq: Every day | ORAL | Status: DC
  Administered 2023-07-02 – 2023-07-08 (×7): 100 mg via ORAL
  Filled 2023-07-01 (×7): qty 1

## 2023-07-01 MED ORDER — ONDANSETRON HCL 4 MG/2ML IJ SOLN
4.0000 mg | Freq: Four times a day (QID) | INTRAMUSCULAR | Status: DC | PRN
  Administered 2023-07-04: 4 mg via INTRAVENOUS
  Filled 2023-07-01: qty 2

## 2023-07-01 MED ORDER — POTASSIUM CHLORIDE CRYS ER 20 MEQ PO TBCR
40.0000 meq | EXTENDED_RELEASE_TABLET | Freq: Once | ORAL | Status: AC
  Administered 2023-07-01: 40 meq via ORAL
  Filled 2023-07-01: qty 2

## 2023-07-01 MED ORDER — CEFTRIAXONE SODIUM 2 G IJ SOLR
2.0000 g | INTRAMUSCULAR | Status: DC
  Administered 2023-07-01 – 2023-07-04 (×4): 2 g via INTRAVENOUS
  Filled 2023-07-01 (×4): qty 20

## 2023-07-01 MED ORDER — FUROSEMIDE 10 MG/ML IJ SOLN
40.0000 mg | Freq: Two times a day (BID) | INTRAMUSCULAR | Status: AC
  Administered 2023-07-01 – 2023-07-05 (×10): 40 mg via INTRAVENOUS
  Filled 2023-07-01 (×10): qty 4

## 2023-07-01 MED ORDER — TRAMADOL HCL 50 MG PO TABS
50.0000 mg | ORAL_TABLET | Freq: Four times a day (QID) | ORAL | Status: DC | PRN
  Administered 2023-07-01 – 2023-07-06 (×5): 50 mg via ORAL
  Filled 2023-07-01 (×5): qty 1

## 2023-07-01 MED ORDER — LIDOCAINE HCL 1 % IJ SOLN
INTRAMUSCULAR | Status: AC
  Filled 2023-07-01: qty 20

## 2023-07-01 MED ORDER — ALBUTEROL SULFATE (2.5 MG/3ML) 0.083% IN NEBU: 2.5000 mg | INHALATION_SOLUTION | RESPIRATORY_TRACT | Status: DC | PRN

## 2023-07-01 MED ORDER — METOPROLOL SUCCINATE ER 25 MG PO TB24
12.5000 mg | ORAL_TABLET | Freq: Every day | ORAL | Status: DC
Start: 2023-07-01 — End: 2023-07-08
  Administered 2023-07-01 – 2023-07-08 (×8): 12.5 mg via ORAL
  Filled 2023-07-01 (×8): qty 1

## 2023-07-01 NOTE — H&P (Signed)
 History and Physical    John [name] NWG:[id number] DOB: 1955/06/04 DOA: 06/30/2023  PCP: Patient, No Pcp Per  Patient coming from: Home  Chief Complaint: Abdominal and leg swelling  HPI: John [name] is a 68 y.o. male with medical history significant of HFpEF, hypertension, CKD stage IIIa, alcoholic liver cirrhosis with varices, history of ascites requiring paracentesis, alcohol abuse in remission (stopped drinking over 10 years ago), COPD, renal cell carcinoma status post cryoablation, hep C, seizures, polysubstance abuse (cocaine and marijuana), anemia, thrombocytopenia, gout, PAD, CVA, obesity.  Patient was admitted 05/07/2023-05/14/2023 for worsening abdominal and leg swelling in the setting of noncompliance with his medications including diuretics.  Underwent paracentesis twice during this admission for ascites.  He was found to have new onset A-fib/flutter for which he was not a good candidate for anticoagulation due to history of cirrhosis/esophageal and gastric varices/prior GI bleed.  Patient presents to the ED for evaluation of worsening abdominal/bilateral leg swelling and shortness of breath.  Vital signs on arrival: Temperature 98.3 F, pulse 88, respiratory rate 18, blood pressure 120/97, and SpO2 98% on room air.  Labs showing no leukocytosis, hemoglobin 9.3 (stable), platelet count 116k (at baseline), creatinine 1.8 (stable), BNP 199.  UA with negative nitrite, large amount of leukocytes, and microscopy showing 6-10 RBCs, >50 WBCs, and many bacteria.  Chest x-ray showing findings of CHF with pulmonary edema. Patient was given IV Lasix 60 mg.  TRH called to admit.  Patient is reporting 4-5-day history of worsening abdominal and bilateral leg swelling.  Endorsing mild shortness of breath but denies chest pain.  States he ran out of his home Lasix a month ago and did not get it refilled.  Denies nausea, vomiting, or abdominal pain.  He is having regular bowel movements.  Review  of Systems:  Review of Systems  All other systems reviewed and are negative.   Past Medical History:  Diagnosis Date   Allergy    Arthritis    oa back   Asthma    Cataract    both eyes   CHF (congestive heart failure) (HCC) 2001   sees primary for   Chronic back pain    Chronic kidney disease    ckd 3   Cirrhosis (HCC)    alcoholic with h/o varices, ascites   Coronary artery disease    nonobstrucrtibe   ETOH abuse    GERD (gastroesophageal reflux disease)    Gout    Hepatitis    hepatitic c 2018 24 week tx with ecuplipsa, no hepatitic c detected after tx   History of blood transfusion 8-9- yrs ago   Hypertension    Optic neuropathy, left    PAD (peripheral artery disease) (HCC)    slight  primary manages   Polysubstance abuse (HCC)    Renal cell carcinoma (HCC) 2016, 2018 and 2022   left ablation   Seizures [facility])    age 66 none since   Sickle cell trait (HCC)    Stroke (HCC) 1998   "light no problems with"   Uses walker 12/04/2020   Wears dentures    Wears glasses     Past Surgical History:  Procedure Laterality Date   ARTHRODESIS METATARSALPHALANGEAL JOINT (MTPJ) Right 12/07/2020   Procedure: ARTHRODESIS METATARSALPHALANGEAL JOINT (MTPJ);  Surgeon: Felecia Shelling, DPM;  Location: [facility];  Service: Podiatry;  Laterality: Right;   COLONOSCOPY  2021   ESOPHAGOGASTRODUODENOSCOPY N/A 08/14/2014   Procedure: ESOPHAGOGASTRODUODENOSCOPY (EGD);  Surgeon: Meryl Dare,  MD;  Location: [facility];  Service: Endoscopy;  Laterality: N/A;   ESOPHAGOGASTRODUODENOSCOPY (EGD) WITH PROPOFOL N/A 06/03/2017   Procedure: ESOPHAGOGASTRODUODENOSCOPY (EGD) WITH PROPOFOL;  Surgeon: Meryl Dare, MD;  Location: [facility];  Service: Endoscopy;  Laterality: N/A;   HALLUX FUSION Left 07/28/2020   Procedure: HALLUX FUSION MPJ;  Surgeon: Felecia Shelling, DPM;  Location[facility];  Service: Podiatry;  Laterality: Left;   HAMMER TOE SURGERY Left  07/28/2020   Procedure: HAMMER TOE CORRECTION  2,3,AND4 LEFT FOOT;  Surgeon: Felecia Shelling, DPM;  Location: [place];  Service: Podiatry;  Laterality: Left;   HAMMER TOE SURGERY Right 12/07/2020   Procedure: HAMMER TOE CORRECTION  2-4;  Surgeon: Felecia Shelling, DPM;  Location: [facility];  Service: Podiatry;  Laterality: Right;   IR RADIOLOGIST EVAL & MGMT  09/25/2016   IR RADIOLOGIST EVAL & MGMT  12/10/2016   IR RADIOLOGIST EVAL & MGMT  03/25/2018   IR RADIOLOGIST EVAL & MGMT  03/24/2019   IR RADIOLOGIST EVAL & MGMT  05/17/2020   IR RADIOLOGIST EVAL & MGMT  08/03/2020   IR RADIOLOGIST EVAL & MGMT  11/22/2020   IR RADIOLOGIST EVAL & MGMT  05/31/2021   METATARSAL HEAD EXCISION Left 07/28/2020   Procedure: METATARSAL HEAD EXCISION TOES 2,3,AND 4  LEFT FOOT;  Surgeon: Felecia Shelling, DPM;  Location: [facility];  Service: Podiatry;  Laterality: Left;   METATARSAL OSTEOTOMY Right 12/07/2020   Procedure: METATARSAL OSTEOTOMY TOES 2-4 RIGHT FOOT;  Surgeon: Felecia Shelling, DPM;  Location: [facility];  Service: Podiatry;  Laterality: Right;   none     RADIOFREQUENCY ABLATION Left 11/08/2016   Procedure: LEFT RENAL CRYOABLATION;  Surgeon: Berdine Dance, MD;  Location: [facility];  Service: Anesthesiology;  Laterality: Left;   RADIOLOGY WITH ANESTHESIA N/A 08/15/2014   Procedure: RADIOLOGY WITH ANESTHESIA;  Surgeon: Berdine Dance, MD;  Location[facility];  Service: Radiology;  Laterality: N/A;   RADIOLOGY WITH ANESTHESIA Left 07/05/2020   Procedure: CT CRYOABLATION;  Surgeon: Berdine Dance, MD;  Location: [facility];  Service: Anesthesiology;  Laterality: Left;   UPPER GASTROINTESTINAL ENDOSCOPY       reports that he quit smoking about 12 years ago. His smoking use included cigarettes. He started smoking about 32 years ago. He has a 6 pack-year smoking history. He has never used smokeless tobacco. He reports current drug use. Drugs: Cocaine and  Marijuana. He reports that he does not drink alcohol.  Allergies  Allergen Reactions   Penicillins Other (See Comments)    Convulsions, Patient could not walk, childhood allergy   Has patient had a PCN reaction causing immediate rash, facial/tongue/throat swelling, SOB or lightheadedness with hypotension: No Has patient had a PCN reaction causing severe rash involving mucus membranes or skin necrosis: No Has patient had a PCN reaction that required hospitalization No Has patient had a PCN reaction occurring within the last 10 years: No If all of the above answers are "NO", then may proceed with Cephalosporin use.    Family History  Problem Relation Age of Onset   Hypertension Mother        Living   Kidney disease Mother    Diabetes Mother    Heart disease Mother    Hypertension Father        Deceased, 39   Ulcers Father    Stomach cancer Father    Hypertension Brother    Diabetes Brother  Kidney disease Brother    Hypertension Sister    Colon cancer Neg Hx    Esophageal cancer Neg Hx    Rectal cancer Neg Hx     Prior to Admission medications   Medication Sig Start Date End Date Taking? Authorizing Provider  acetaminophen (TYLENOL) 325 MG tablet Take 650 mg by mouth every 8 (eight) hours as needed for moderate pain (NOT TO EXCEED 2,000 MG/DAY).   Yes [provider]  allopurinol (ZYLOPRIM) 100 MG tablet Take 1 tablet (100 mg total) by mouth daily. 05/12/18   Rodolph Bong, MD  atorvastatin (LIPITOR) 40 MG tablet Take 40 mg by mouth daily. 04/29/19   [provider]  brimonidine (ALPHAGAN) 0.15 % ophthalmic solution Place 1 drop into both eyes at bedtime.    [provider]  ferrous sulfate 325 (65 FE) MG EC tablet Take 325 mg by mouth every Monday, Wednesday, and Friday.    [provider]  FLOVENT HFA 220 MCG/ACT inhaler Inhale 2 puffs into the lungs in the morning and at bedtime. 01/27/19   [provider]  folic acid  (FOLVITE) 1 MG tablet Take 1 tablet (1 mg total) by mouth daily. 05/15/23   Osvaldo Shipper, MD  furosemide (LASIX) 40 MG tablet Take 1.5 tablets (60 mg total) by mouth daily. 05/23/23 08/21/23  Tereso Newcomer T, PA-C  ketorolac (ACULAR) 0.5 % ophthalmic solution Place 1 drop into the right eye 4 (four) times daily. 03/20/23   [provider]  lidocaine (LIDODERM) 5 % Place 1 patch onto the skin daily. Remove & Discard patch within 12 hours or as directed by MD 11/08/22   Linwood Dibbles, MD  melatonin 5 MG TABS Take 5 mg by mouth at bedtime.    [provider]  Menthol-Methyl Salicylate (SALONPAS PAIN RELIEF PATCH) PTCH Apply 1 patch topically daily as needed (for pain- affected area).    [provider]  metoprolol succinate (TOPROL XL) 25 MG 24 hr tablet Take 0.5 tablets (12.5 mg total) by mouth daily. 05/23/23   Tereso Newcomer T, PA-C  ofloxacin (OCUFLOX) 0.3 % ophthalmic solution Place 1 drop into the right eye 4 (four) times daily. 03/20/23   [provider]  omeprazole (PRILOSEC) 40 MG capsule TAKE ONE CAPSULE BY MOUTH DAILY 07/02/18   Meryl Dare, MD  potassium chloride (KLOR-CON) 10 MEQ tablet Take 10 mEq by mouth daily.    [provider]  prednisoLONE acetate (PRED FORTE) 1 % ophthalmic suspension Place 1 drop into the right eye 4 (four) times daily. 03/20/23   [provider]  PROAIR HFA 108 (90 Base) MCG/ACT inhaler Inhale 2 puffs into the lungs every 6 (six) hours as needed for wheezing or shortness of breath. 10/02/17   [provider]  spironolactone (ALDACTONE) 50 MG tablet Take 1 tablet (50 mg total) by mouth daily. 05/14/23   Osvaldo Shipper, MD  terbinafine (LAMISIL) 1 % cream Apply 1 Application topically See admin instructions. Apply to feet as directed every other day    [provider]  thiamine (VITAMIN B-1) 100 MG tablet Take 1 tablet (100 mg total) by mouth daily. 05/15/23   Osvaldo Shipper, MD    Physical  Exam: Vitals:   06/30/23 1632 06/30/23 1633 06/30/23 1943 06/30/23 2229  BP: (!) 120/97  134/85 121/83  Pulse: 88  86 92  Resp: 18  18 18   Temp: 98.3 F (36.8 C)  98.4 F (36.9 C) 97.9 F (36.6 C)  TempSrc:  Oral  Oral Oral  SpO2: 98%  100% 100%  Weight:  105.2 kg    Height:  5\' 7"  (1.702 m)      Physical Exam Vitals reviewed.  Constitutional:      General: He is not in acute distress. HENT:     Head: Normocephalic and atraumatic.  Eyes:     Extraocular Movements: Extraocular movements intact.  Cardiovascular:     Rate and Rhythm: Normal rate and regular rhythm.     Pulses: Normal pulses.  Pulmonary:     Effort: Pulmonary effort is normal. No respiratory distress.     Breath sounds: Rales present. No wheezing.  Abdominal:     General: Bowel sounds are normal. There is distension.     Palpations: Abdomen is soft.     Tenderness: There is no guarding.     Comments: Abdomen significantly distended with ascites Generalized tenderness to palpation  Musculoskeletal:     Cervical back: Normal range of motion.     Right lower leg: Edema present.     Left lower leg: Edema present.     Comments: 2+ pitting edema of bilateral lower extremities  Skin:    General: Skin is warm and dry.  Neurological:     General: No focal deficit present.     Mental Status: He is alert and oriented to person, place, and time.     Labs on Admission: I have personally reviewed following labs and imaging studies  CBC: Recent Labs  Lab 06/30/23 1643  WBC 5.1  HGB 9.3*  HCT 30.4*  MCV 80.6  PLT 116*   Basic Metabolic Panel: Recent Labs  Lab 06/30/23 1643  NA 138  K 3.7  CL 105  CO2 24  GLUCOSE 89  BUN 18  CREATININE 1.79*  CALCIUM 9.0   GFR: Estimated Creatinine Clearance: 46.9 mL/min (A) (by C-G formula based on SCr of 1.79 mg/dL (H)). Liver Function Tests: Recent Labs  Lab 06/30/23 1643  AST 17  ALT 9  ALKPHOS 55  BILITOT 1.2  PROT 7.3  ALBUMIN 3.4*   Recent Labs   Lab 06/30/23 1643  LIPASE 31   No results for input(s): "AMMONIA" in the last 168 hours. Coagulation Profile: Recent Labs  Lab 06/30/23 2134  INR 1.3*   Cardiac Enzymes: No results for input(s): "CKTOTAL", "CKMB", "CKMBINDEX", "TROPONINI" in the last 168 hours. BNP (last 3 results) Recent Labs    11/05/22 1358 11/28/22 0849  PROBNP 3,248* 4,858*   HbA1C: No results for input(s): "HGBA1C" in the last 72 hours. CBG: No results for input(s): "GLUCAP" in the last 168 hours. Lipid Profile: No results for input(s): "CHOL", "HDL", "LDLCALC", "TRIG", "CHOLHDL", "LDLDIRECT" in the last 72 hours. Thyroid Function Tests: No results for input(s): "TSH", "T4TOTAL", "FREET4", "T3FREE", "THYROIDAB" in the last 72 hours. Anemia Panel: No results for input(s): "VITAMINB12", "FOLATE", "FERRITIN", "TIBC", "IRON", "RETICCTPCT" in the last 72 hours. Urine analysis:    Component Value Date/Time   COLORURINE YELLOW 06/30/2023 2208   APPEARANCEUR HAZY (A) 06/30/2023 2208   LABSPEC 1.012 06/30/2023 2208   PHURINE 5.0 06/30/2023 2208   GLUCOSEU NEGATIVE 06/30/2023 2208   HGBUR MODERATE (A) 06/30/2023 2208   BILIRUBINUR NEGATIVE 06/30/2023 2208   KETONESUR NEGATIVE 06/30/2023 2208   PROTEINUR NEGATIVE 06/30/2023 2208   UROBILINOGEN 1.0 10/31/2014 2127   NITRITE NEGATIVE 06/30/2023 2208   LEUKOCYTESUR LARGE (A) 06/30/2023 2208    Radiological Exams on Admission: DG Chest 2 View Result Date: 06/30/2023 CLINICAL DATA:  Shortness of breath and bilateral leg swelling EXAM: CHEST - 2 VIEW COMPARISON:  12/17/2023 FINDINGS: Stable cardiomegaly. Aortic atherosclerotic calcification. Vascular congestion and vague hazy airspace opacities bilaterally. Trace bilateral pleural effusions. No pneumothorax. Embolization coils left upper quadrant. IMPRESSION: Findings suggestive of CHF with pulmonary edema. Electronically Signed   By: Minerva Fester M.D.   On: 06/30/2023 21:24    EKG: Independently  reviewed.  Rate controlled atrial flutter, T wave abnormality inferolaterally.  No significant change since previous tracing.  Assessment and Plan  Acute on chronic HFpEF Last echo done on 05/10/2023 showing EF 55 to 60%, RV systolic function low normal, normal pulmonary artery systolic pressure, left atrium moderately dilated, and mild mitral regurgitation.  Patient is presenting with volume overload in the setting of Lasix noncompliance.  BNP 199.  Chest x-ray showing pulmonary edema.  No hypoxia or respiratory distress.  Patient was given IV Lasix 60 mg in the ED.  Repeat labs in the morning to check renal function given CKD and order additional doses of Lasix accordingly.  Monitor intake and output, daily weights.  Decompensated alcoholic liver cirrhosis with ascites ?SBP given abdominal tenderness but no fever or leukocytosis.  Started on ceftriaxone.  IR consulted for paracentesis in the morning and peritoneal fluid analysis labs ordered.  ?UTI UA with evidence of pyuria and bacteriuria, however, patient is not endorsing urinary symptoms.  Started on ceftriaxone and follow-up urine culture.  Persistent atrial flutter Currently rate controlled.  Not on anticoagulation due to history of cirrhosis/esophageal and gastric varices/prior GI bleed.  CKD stage IIIa Creatinine stable, continue to monitor labs.  Chronic anemia and thrombocytopenia In the setting of liver cirrhosis.  Hemoglobin and platelet count stable.  No signs of bleeding.  Continue to monitor CBC.  Hypertension: Currently normotensive. COPD: Stable, no signs of acute exacerbation Gout PAD History of CVA Pharmacy med rec pending.  DVT prophylaxis: SCDs Code Status: Full Code (discussed with the patient) Level of care: Telemetry bed Admission status: It is my clinical opinion that admission to INPATIENT is reasonable and necessary because of the expectation that this patient will require hospital care that crosses at  least 2 midnights to treat this condition based on the medical complexity of the problems presented.  Given the aforementioned information, the predictability of an adverse outcome is felt to be significant.  John Giovanni MD [facility]  If 7PM-7AM, please contact night-coverage [email or web address]  07/01/2023, 12:44 AM

## 2023-07-01 NOTE — Progress Notes (Signed)
 PROGRESS NOTE    John [name]  ZOX:[id number] DOB: 02-03-1956 DOA: 06/30/2023 PCP: Patient, No Pcp Per    Brief Narrative:   John [name] is a 68 y.o. male with past medical history significant for chronic diastolic congestive heart failure, HTN, CKD stage IIIa, EtOH cirrhosis with varices and ascites requiring serial paracentesis, EtOH abuse in remission (stopped drinking 10 years ago), COPD, renal cell carcinoma s/p cryoablation, hepatitis C, seizure disorder, polysubstance abuse (cocaine/marijuana), anemia, thrombocytopenia, gout, PAD, CVA, obesity who presented to [facility] ED on 06/30/2023 via EMS from home with progressive lower extremity edema, abdominal pain with worsening distention in the setting of noncompliance with his home medication.  Also endorses mild shortness of breath.  Denies nausea/vomiting, no issues with urination or bowel movements.  Reports he ran out of his home Lasix about a month ago and did not get it refilled.  Recent hospitalization in which she underwent paracentesis twice; he was found to have new onset A-fib/flutter which she was not a good candidate for anticoagulation due to history of cirrhosis, esophageal and gastric varices, prior GI bleed.  In the ED, temperature 98.3 F, HR 88, RR 18, BP 120/97, SpO2 98% on room air.  WBC 5.1, hemoglobin 9.3, platelet count 116.  Sodium 138, potassium 3.7, chloride 105, CO2 24, glucose 89, BUN 18, Cr 1.79.  AST 17, ALT 9, total bilirubin 1.2.  BNP 199.2.  INR 1.3.  Urinalysis with large leukocytes, negative nitrite, many bacteria, greater than 50 WBCs.  Chest x-ray with pulmonary edema.  EKG with atrial flutter, rate 88.  Patient was given 60 mg IV Lasix.  TRH consulted for admission for further evaluation management of decompensated cirrhosis, CHF exacerbation in the setting of medication noncompliance.    Assessment & Plan:   Acute on chronic diastolic congestive heart failure Patient presenting to  ED with progressive lower extremity edema, abdominal distention, shortness of breath.  Patient is afebrile without leukocytosis.  Chest x-ray notable for pulmonary edema with elevated BNP of 199.2.  Noncompliant with home Aldactone and Lasix.  TTE 05/10/2023 with LVEF 55 to 60%, RV systolic function low normal, normal PASP, left atrium moderately dilated, mild mitral regurgitation. -- Furosemide 40 mg IV twice daily -- 2 g sodium restricted diet; 1200 mL fluid restriction -- Strict I's and O's and daily weights -- BMP daily  EtOH cirrhosis with ascites, decompensated Patient presenting with progressive abdominal distention in the setting of noncompliance with his home furosemide and spironolactone. -- IR consulted for ultrasound-guided therapeutic/diagnostic paracentesis -- Continue Lasix as above; fluid restriction, low-salt diet  Urinary tract infection Urinalysis with large leukocytes, negative nitrite, many bacteria, greater than 50 WBCs. -- Urine culture: Pending -- Ceftriaxone 2 g IV every 24 hours  CKD stage IIIa Baseline creatinine 1.8 - 2.0.  Creatinine on admission 1.79, stable -- BMP daily  Essential hypertension Persistent atrial for flutter Rate controlled.  Not on anticoagulation due to history of cirrhosis, esophageal varices and prior GI bleed. -- Metoprolol succinate 12.5 mg p.o. daily -- Continue monitor on telemetry  COPD Not oxygen dependent, on room air with adequate SpO2. -- Albuterol neb every 4 hours as needed wheezing/shortness of breath  Renal cell carcinoma S/p cryoablation.  Outpatient follow-up with urology  PAD Hx CVA -- Atorvastatin 40 mg p.o. daily -- Not on antiplatelet due to thrombocytopenia, anemia  Anemia/thrombocytopenia, chronic Hemoglobin 9.0, platelet count 104, stable.  Hx gout -- Allopurinol 100 mg p.o. daily  GERD -- Protonix  80 mg p.o. daily (substituted for home Nexium)  Obesity class II Body mass index is 36.34 kg/m.     DVT prophylaxis: SCDs Start: 07/01/23 0144    Code Status: Full Code Family Communication: No family present at bedside this morning  Disposition Plan:  Level of care: Telemetry Status is: Inpatient Remains inpatient appropriate because: IV diuresis, IR consulted for paracentesis    Consultants:  Interventional radiology  Procedures:  Paracentesis: Pending  Antimicrobials:  Ceftriaxone 3/3>>   Subjective: Patient seen examined bedside, lying in bed.  Remains in ED holding area.  Complaining of continued swelling to lower extremities and abdominal distention.  Wanting something to eat/drink.  Reports several falls at home, lives alone.  Unable to ascertain why he did not get his diuretics refilled over the last month.  No other specific complaints, concerns or questions at this time.  Denies headache, no chest pain, no palpitations, no fever/chills/night sweats, no cough/congestion, no nausea/vomiting/diarrhea, no focal weakness, no fatigue, no paresthesias.  Objective: Vitals:   07/01/23 0227 07/01/23 0520 07/01/23 0634 07/01/23 0800  BP:  106/77  118/86  Pulse:  87  92  Resp:  20  18  Temp: 98.2 F (36.8 C)  97.8 F (36.6 C)   TempSrc: Oral  Oral   SpO2:  98%  99%  Weight:      Height:       No intake or output data in the 24 hours ending 07/01/23 1018 Filed Weights   06/30/23 1633  Weight: 105.2 kg    Examination:  Physical Exam: GEN: NAD, alert and oriented x 3, chronically ill appearance, appears older than stated age HEENT: NCAT, PERRL, EOMI, sclera clear, MMM PULM: Breath sounds slightly diminished bilateral bases with crackles, no wheezing, normal respiratory effort without accessory muscle use, on room air with SpO2 98% CV: IRR w/o M/G/R GI: abd soft, slight generalized TTP, + distention, + BS MSK: + lower extremity peripheral edema, muscle strength globally intact 5/5 bilateral upper/lower extremities NEURO: CN II-XII intact, no focal deficits,  sensation to light touch intact PSYCH: normal mood/affect Integumentary: No concerning rashes/lesions/wounds noted on exposed skin surfaces.    Data Reviewed: I have personally reviewed following labs and imaging studies  CBC: Recent Labs  Lab 06/30/23 1643 07/01/23 0525  WBC 5.1 4.9  HGB 9.3* 9.0*  HCT 30.4* 28.1*  MCV 80.6 80.7  PLT 116* 104*   Basic Metabolic Panel: Recent Labs  Lab 06/30/23 1643 07/01/23 0525  NA 138 141  K 3.7 3.5  CL 105 103  CO2 24 27  GLUCOSE 89 98  BUN 18 19  CREATININE 1.79* 1.76*  CALCIUM 9.0 8.7*   GFR: Estimated Creatinine Clearance: 47.7 mL/min (A) (by C-G formula based on SCr of 1.76 mg/dL (H)). Liver Function Tests: Recent Labs  Lab 06/30/23 1643  AST 17  ALT 9  ALKPHOS 55  BILITOT 1.2  PROT 7.3  ALBUMIN 3.4*   Recent Labs  Lab 06/30/23 1643  LIPASE 31   No results for input(s): "AMMONIA" in the last 168 hours. Coagulation Profile: Recent Labs  Lab 06/30/23 2134  INR 1.3*   Cardiac Enzymes: No results for input(s): "CKTOTAL", "CKMB", "CKMBINDEX", "TROPONINI" in the last 168 hours. BNP (last 3 results) Recent Labs    11/05/22 1358 11/28/22 0849  PROBNP 3,248* 4,858*   HbA1C: No results for input(s): "HGBA1C" in the last 72 hours. CBG: No results for input(s): "GLUCAP" in the last 168 hours. Lipid Profile: No results for  input(s): "CHOL", "HDL", "LDLCALC", "TRIG", "CHOLHDL", "LDLDIRECT" in the last 72 hours. Thyroid Function Tests: No results for input(s): "TSH", "T4TOTAL", "FREET4", "T3FREE", "THYROIDAB" in the last 72 hours. Anemia Panel: No results for input(s): "VITAMINB12", "FOLATE", "FERRITIN", "TIBC", "IRON", "RETICCTPCT" in the last 72 hours. Sepsis Labs: No results for input(s): "PROCALCITON", "LATICACIDVEN" in the last 168 hours.  No results found for this or any previous visit (from the past 240 hours).       Radiology Studies: DG Chest 2 View Result Date: 06/30/2023 CLINICAL DATA:   Shortness of breath and bilateral leg swelling EXAM: CHEST - 2 VIEW COMPARISON:  12/17/2023 FINDINGS: Stable cardiomegaly. Aortic atherosclerotic calcification. Vascular congestion and vague hazy airspace opacities bilaterally. Trace bilateral pleural effusions. No pneumothorax. Embolization coils left upper quadrant. IMPRESSION: Findings suggestive of CHF with pulmonary edema. Electronically Signed   By: Minerva Fester M.D.   On: 06/30/2023 21:24        Scheduled Meds:  furosemide  40 mg Intravenous BID   Continuous Infusions:  cefTRIAXone (ROCEPHIN)  IV Stopped (07/01/23 0454)     LOS: 1 day    Time spent: 52 minutes spent on chart review, discussion with nursing staff, consultants, updating family and interview/physical exam; more than 50% of that time was spent in counseling and/or coordination of care.    Alvira Philips [place], DO [facility] Available via Epic secure chat 7am-7pm After these hours, please refer to coverage provider listed on [email or web address] 07/01/2023, 10:18 AM

## 2023-07-01 NOTE — Progress Notes (Signed)
 Heart Failure Navigator Progress Note  Assessed for Heart & Vascular TOC clinic readiness.  Patient does not meet criteria due to Advanced Heart Failure Team patient of Dr. Elwyn Lade. .   Navigator will sign off at this time.    Rhae Hammock, BSN, Scientist, clinical (histocompatibility and immunogenetics) Only

## 2023-07-01 NOTE — Procedures (Signed)
 PROCEDURE SUMMARY:  Successful ultrasound guided paracentesis from the left lower  quadrant.  Yielded 3.5 liters of hazy,amber fluid.  No immediate complications.  The patient tolerated the procedure well.   Specimen was sent for labs.  EBL < 2 cc  The patient has required >/=2 paracenteses in a 30 day period and a screening evaluation by the [facility] has been arranged.   Artemio Aly

## 2023-07-01 NOTE — Plan of Care (Signed)

## 2023-07-01 NOTE — Telephone Encounter (Signed)
 Left message to call back.  Letter sent to call should he wish to schedule a consult.

## 2023-07-01 NOTE — Evaluation (Signed)
 Occupational Therapy Evaluation Patient Details Name: John [name] MRN: [id number] DOB: July 29, 1955 Today's Date: 07/01/2023   History of Present Illness   Patient is a 68 year old male who presented to ED on 3/3 with abdominal pain,distention with noncompliance with home meds. Patient was admitted with acute on chronic diastolic congestive heart failure, EtOH cirrhosis with ascites, UTI. PMH: CKD III, COPD, renal cell carcinoma, PAD, GERD, obesity.     Clinical Impressions Patient is a 68 year old male who was admitted for above. Patient was living at home alone at rollator level per patient report. Patient reported having PRN support from niece who lives nearby. Currently, patients pain in R knee is limiting patient's evaluation. Patient reported increased pain in knee stating that he has not had his home medications for pain. Nurse made aware. No imaging of knee observed in chart. Patient was noted to have decreased functional activity tolerance, decreased endurance, decreased standing balance, decreased safety awareness, and decreased knowledge of AD/AE impacting participation in ADLs. Patient will benefit from continued inpatient follow up therapy, <3 hours/day.      If plan is discharge home, recommend the following:   Two people to help with walking and/or transfers;A lot of help with bathing/dressing/bathroom;Assistance with cooking/housework;Direct supervision/assist for medications management;Assist for transportation;Help with stairs or ramp for entrance;Direct supervision/assist for financial management     Functional Status Assessment   Patient has had a recent decline in their functional status and demonstrates the ability to make significant improvements in function in a reasonable and predictable amount of time.     Equipment Recommendations   None recommended by OT      Precautions/Restrictions   Precautions Precautions: Fall Recall of  Precautions/Restrictions: Impaired Restrictions Weight Bearing Restrictions Per Provider Order: No     Mobility Bed Mobility Overal bed mobility: Needs Assistance Bed Mobility: Supine to Sit     Supine to sit: Max assist     General bed mobility comments: attempted to get BLE over EOB but pain in movement of RLE impacted progress with patient returning to bed at this time.        Balance Overall balance assessment: Mild deficits observed, not formally tested                   ADL either performed or assessed with clinical judgement   ADL Overall ADL's : Needs assistance/impaired Eating/Feeding: Set up;Sitting Eating/Feeding Details (indicate cue type and reason): educated on fluid restrictions. Grooming: Sitting;Supervision/safety   Upper Body Bathing: Sitting;Contact guard assist   Lower Body Bathing: Total assistance;Bed level   Upper Body Dressing : Bed level;Set up   Lower Body Dressing: Bed level;Total assistance     Toilet Transfer Details (indicate cue type and reason): unable to progress to EOB with patient reporting increased pain in RLE. nurse made aware. patient reporetd he normally takes oxy at home. nurse made aware. Toileting- Clothing Manipulation and Hygiene: Bed level;Total assistance               Vision Baseline Vision/History: 1 Wears glasses Additional Comments: reported he wears glasses for everything but reading.            Pertinent Vitals/Pain Pain Assessment Pain Assessment: Faces Faces Pain Scale: Hurts whole lot Pain Location: R knee Pain Descriptors / Indicators: Grimacing, Constant Pain Intervention(s): Limited activity within patient's tolerance, Monitored during session, Patient requesting pain meds-RN notified     Extremity/Trunk Assessment Upper Extremity Assessment Upper Extremity Assessment: Overall WFL for  tasks assessed   Lower Extremity Assessment Lower Extremity Assessment: Defer to PT evaluation (pain  in R knee)       Communication Communication Communication: No apparent difficulties   Cognition Arousal: Alert Behavior During Therapy: WFL for tasks assessed/performed Cognition: No apparent impairments             OT - Cognition Comments: patient has a history of noncompliance.                 Following commands: Intact                  Home Living Family/patient expects to be discharged to:: Private residence Living Arrangements: Alone Available Help at Discharge: Friend(s) Type of Home: Apartment Home Access: Elevator     Home Layout: One level     Bathroom Shower/Tub: Chief Strategy Officer: Handicapped height     Home Equipment: Rollator (4 wheels);Rolling Walker (2 wheels);Cane - single point;Grab bars - tub/shower;Grab bars - toilet;BSC/3in1          Prior Functioning/Environment Prior Level of Function : Independent/Modified Independent                    OT Problem List: Decreased strength;Impaired balance (sitting and/or standing);Pain;Decreased knowledge of precautions;Decreased safety awareness;Decreased knowledge of use of DME or AE;Decreased activity tolerance   OT Treatment/Interventions: Self-care/ADL training;DME and/or AE instruction;Therapeutic activities;Balance training;Energy conservation;Patient/family education;Therapeutic exercise      OT Goals(Current goals can be found in the care plan section)   Acute Rehab OT Goals Patient Stated Goal: to get knee pain back under control OT Goal Formulation: With patient Time For Goal Achievement: 07/15/23 Potential to Achieve Goals: Fair   OT Frequency:  Min 2X/week       AM-PAC OT "6 Clicks" Daily Activity     Outcome Measure Help from another person eating meals?: None Help from another person taking care of personal grooming?: A Little Help from another person toileting, which includes using toliet, bedpan, or urinal?: Total Help from another person  bathing (including washing, rinsing, drying)?: Total Help from another person to put on and taking off regular upper body clothing?: A Little Help from another person to put on and taking off regular lower body clothing?: Total 6 Click Score: 13   End of Session Nurse Communication: Mobility status;Patient requests pain meds  Activity Tolerance: Patient limited by pain Patient left: in bed;with call bell/phone within reach;with bed alarm set  OT Visit Diagnosis: Unsteadiness on feet (R26.81);Pain;Other abnormalities of gait and mobility (R26.89);History of falling (Z91.81);Muscle weakness (generalized) (M62.81) Pain - Right/Left: Right Pain - part of body: Knee                Time: 1610-9604 OT Time Calculation (min): 13 min Charges:  OT General Charges $OT Visit: 1 Visit OT Evaluation $OT Eval Moderate Complexity: 1 Mod  John [name] OTR/L, MS Acute Rehabilitation Department Office# [phone]   John [name] 07/01/2023, 4:12 PM

## 2023-07-02 DIAGNOSIS — K7290 Hepatic failure, unspecified without coma: Secondary | ICD-10-CM | POA: Diagnosis not present

## 2023-07-02 DIAGNOSIS — I5033 Acute on chronic diastolic (congestive) heart failure: Secondary | ICD-10-CM | POA: Diagnosis not present

## 2023-07-02 DIAGNOSIS — K7460 Unspecified cirrhosis of liver: Secondary | ICD-10-CM | POA: Diagnosis not present

## 2023-07-02 LAB — CBC
HCT: 29.7 % — ABNORMAL LOW (ref 39.0–52.0)
Hemoglobin: 9.1 g/dL — ABNORMAL LOW (ref 13.0–17.0)
MCH: 25 pg — ABNORMAL LOW (ref 26.0–34.0)
MCHC: 30.6 g/dL (ref 30.0–36.0)
MCV: 81.6 fL (ref 80.0–100.0)
Platelets: 105 10*3/uL — ABNORMAL LOW (ref 150–400)
RBC: 3.64 MIL/uL — ABNORMAL LOW (ref 4.22–5.81)
RDW: 18.4 % — ABNORMAL HIGH (ref 11.5–15.5)
WBC: 5.1 10*3/uL (ref 4.0–10.5)
nRBC: 0 % (ref 0.0–0.2)

## 2023-07-02 LAB — PH, BODY FLUID: pH, Body Fluid: 7.5

## 2023-07-02 LAB — BASIC METABOLIC PANEL
Anion gap: 9 (ref 5–15)
BUN: 21 mg/dL (ref 8–23)
CO2: 27 mmol/L (ref 22–32)
Calcium: 8.7 mg/dL — ABNORMAL LOW (ref 8.9–10.3)
Chloride: 101 mmol/L (ref 98–111)
Creatinine, Ser: 1.92 mg/dL — ABNORMAL HIGH (ref 0.61–1.24)
GFR, Estimated: 38 mL/min — ABNORMAL LOW (ref >60)
Glucose, Bld: 96 mg/dL (ref 70–99)
Potassium: 4 mmol/L (ref 3.5–5.1)
Sodium: 137 mmol/L (ref 135–145)

## 2023-07-02 LAB — MAGNESIUM: Magnesium: 1.6 mg/dL — ABNORMAL LOW (ref 1.7–2.4)

## 2023-07-02 NOTE — Progress Notes (Signed)
 PT Cancellation Note  Patient Details Name: LATRAVION GRAVES MRN: [id number] DOB: 10/15/1955   Cancelled Treatment:    Reason Eval/Treat Not Completed: Fatigue/lethargy limiting ability to participate. Attempted PT eval-pt just finished walking with nursing. Will check back on tomorrow to reattempt eval.    Faye Ramsay, PT Acute Rehabilitation  Office: [phone]

## 2023-07-02 NOTE — Progress Notes (Signed)
 PROGRESS NOTE    DEAGLAN LILE  ZOX:[id number] DOB: 08/09/1955 DOA: 06/30/2023 PCP: Patient, No Pcp Per    Brief Narrative:   EMANUEL DOWSON is a 68 y.o. male with past medical history significant for chronic diastolic congestive heart failure, HTN, CKD stage IIIa, EtOH cirrhosis with varices and ascites requiring serial paracentesis, EtOH abuse in remission (stopped drinking 10 years ago), COPD, renal cell carcinoma s/p cryoablation, hepatitis C, seizure disorder, polysubstance abuse (cocaine/marijuana), anemia, thrombocytopenia, gout, PAD, CVA, obesity who presented to [facility] ED on 06/30/2023 via EMS from home with progressive lower extremity edema, abdominal pain with worsening distention in the setting of noncompliance with his home medication.  Also endorses mild shortness of breath.  Denies nausea/vomiting, no issues with urination or bowel movements.  Reports he ran out of his home Lasix about a month ago and did not get it refilled.  Recent hospitalization in which she underwent paracentesis twice; he was found to have new onset A-fib/flutter which she was not a good candidate for anticoagulation due to history of cirrhosis, esophageal and gastric varices, prior GI bleed.  In the ED, temperature 98.3 F, HR 88, RR 18, BP 120/97, SpO2 98% on room air.  WBC 5.1, hemoglobin 9.3, platelet count 116.  Sodium 138, potassium 3.7, chloride 105, CO2 24, glucose 89, BUN 18, Cr 1.79.  AST 17, ALT 9, total bilirubin 1.2.  BNP 199.2.  INR 1.3.  Urinalysis with large leukocytes, negative nitrite, many bacteria, greater than 50 WBCs.  Chest x-ray with pulmonary edema.  EKG with atrial flutter, rate 88.  Patient was given 60 mg IV Lasix.  TRH consulted for admission for further evaluation management of decompensated cirrhosis, CHF exacerbation in the setting of medication noncompliance.    Assessment & Plan:   Acute on chronic diastolic congestive heart failure Patient presenting to  ED with progressive lower extremity edema, abdominal distention, shortness of breath.  Patient is afebrile without leukocytosis.  Chest x-ray notable for pulmonary edema with elevated BNP of 199.2.  Noncompliant with home Aldactone and Lasix.  TTE 05/10/2023 with LVEF 55 to 60%, RV systolic function low normal, normal PASP, left atrium moderately dilated, mild mitral regurgitation. -- Furosemide 40 mg IV twice daily -- 2 g sodium restricted diet; 1200 mL fluid restriction -- Strict I's and O's and daily weights -- BMP daily  EtOH cirrhosis with ascites, decompensated Patient presenting with progressive abdominal distention in the setting of noncompliance with his home furosemide and spironolactone. -- s/p nonguided paracentesis by IR on 07/01/2023 with 3.5 L fluid removed -- Peritoneal fluid culture with no organisms on Gram stain,, further pending -- Continue Lasix as above; fluid restriction, low-salt diet  Ecoli Urinary tract infection Urinalysis with large leukocytes, negative nitrite, many bacteria, greater than 50 WBCs. -- Urine culture: >100K Ecoli; susceptibilities pending -- Ceftriaxone 2 g IV every 24 hours  CKD stage IIIa Baseline creatinine 1.8 - 2.0.  Creatinine on admission 1.92, stable -- BMP daily  Essential hypertension Persistent atrial for flutter Rate controlled.  Not on anticoagulation due to history of cirrhosis, esophageal varices and prior GI bleed. -- Metoprolol succinate 12.5 mg p.o. daily -- Continue monitor on telemetry  COPD Not oxygen dependent, on room air with adequate SpO2. -- Albuterol neb every 4 hours as needed wheezing/shortness of breath  Renal cell carcinoma S/p cryoablation.  Outpatient follow-up with urology  PAD Hx CVA -- Atorvastatin 40 mg p.o. daily -- Not on antiplatelet due to thrombocytopenia, anemia  Anemia/thrombocytopenia, chronic Hemoglobin 9.0, platelet count 104, stable.  Hx gout -- Allopurinol 100 mg p.o. daily  GERD --  Protonix 80 mg p.o. daily (substituted for home Nexium)  Obesity class II Body mass index is 35.63 kg/m.    DVT prophylaxis: SCDs Start: 07/01/23 0144    Code Status: Full Code Family Communication: No family present at bedside this morning  Disposition Plan:  Level of care: Telemetry Status is: Inpatient Remains inpatient appropriate because: IV diuresis, anticipate discharge home in 1-2 days    Consultants:  Interventional radiology  Procedures:  Paracentesis: Pending  Antimicrobials:  Ceftriaxone 3/3>>   Subjective: Patient seen examined bedside, lying in bed.  Watching TV.  Reports dyspnea and abdominal discomfort improved following paracentesis yesterday.  Discussed with patient need to comply with his home diuretics in order to maintain decreased fluid volume.  Also discussed with him fluid restriction and reduced sodium diet.  No other specific complaints, concerns or questions at this time.  Denies headache, no chest pain, no palpitations, no fever/chills/night sweats, no cough/congestion, no nausea/vomiting/diarrhea, no focal weakness, no fatigue, no paresthesias.  Objective: Vitals:   07/02/23 0411 07/02/23 0536 07/02/23 0548 07/02/23 0950  BP:  97/69  100/79  Pulse:  86  75  Resp:  18 13 18   Temp:  98.1 F (36.7 C)  (!) 97.4 F (36.3 C)  TempSrc:  Oral  Oral  SpO2:  97%  98%  Weight: 103.2 kg     Height:        Intake/Output Summary (Last 24 hours) at 07/02/2023 1055 Last data filed at 07/02/2023 0900 Gross per 24 hour  Intake 1250 ml  Output 1200 ml  Net 50 ml   Filed Weights   06/30/23 1633 07/02/23 0411  Weight: 105.2 kg 103.2 kg    Examination:  Physical Exam: GEN: NAD, alert and oriented x 3, chronically ill appearance, appears older than stated age HEENT: NCAT, PERRL, EOMI, sclera clear, MMM PULM: Breath sounds slightly diminished bilateral bases with crackles, no wheezing, normal respiratory effort without accessory muscle use, on room air  with SpO2 97% CV: IRR w/o M/G/R GI: abd soft, slight generalized TTP, + distention, + BS MSK: + lower extremity peripheral edema, muscle strength globally intact 5/5 bilateral upper/lower extremities NEURO: CN II-XII intact, no focal deficits, sensation to light touch intact PSYCH: normal mood/affect Integumentary: No concerning rashes/lesions/wounds noted on exposed skin surfaces.    Data Reviewed: I have personally reviewed following labs and imaging studies  CBC: Recent Labs  Lab 06/30/23 1643 07/01/23 0525 07/02/23 0400  WBC 5.1 4.9 5.1  HGB 9.3* 9.0* 9.1*  HCT 30.4* 28.1* 29.7*  MCV 80.6 80.7 81.6  PLT 116* 104* 105*   Basic Metabolic Panel: Recent Labs  Lab 06/30/23 1643 07/01/23 0525 07/02/23 0400  NA 138 141 137  K 3.7 3.5 4.0  CL 105 103 101  CO2 24 27 27   GLUCOSE 89 98 96  BUN 18 19 21   CREATININE 1.79* 1.76* 1.92*  CALCIUM 9.0 8.7* 8.7*  MG  --   --  1.6*   GFR: Estimated Creatinine Clearance: 43.3 mL/min (A) (by C-G formula based on SCr of 1.92 mg/dL (H)). Liver Function Tests: Recent Labs  Lab 06/30/23 1643  AST 17  ALT 9  ALKPHOS 55  BILITOT 1.2  PROT 7.3  ALBUMIN 3.4*   Recent Labs  Lab 06/30/23 1643  LIPASE 31   No results for input(s): "AMMONIA" in the last 168 hours. Coagulation Profile:  Recent Labs  Lab 06/30/23 2134  INR 1.3*   Cardiac Enzymes: No results for input(s): "CKTOTAL", "CKMB", "CKMBINDEX", "TROPONINI" in the last 168 hours. BNP (last 3 results) Recent Labs    11/05/22 1358 11/28/22 0849  PROBNP 3,248* 4,858*   HbA1C: No results for input(s): "HGBA1C" in the last 72 hours. CBG: No results for input(s): "GLUCAP" in the last 168 hours. Lipid Profile: No results for input(s): "CHOL", "HDL", "LDLCALC", "TRIG", "CHOLHDL", "LDLDIRECT" in the last 72 hours. Thyroid Function Tests: No results for input(s): "TSH", "T4TOTAL", "FREET4", "T3FREE", "THYROIDAB" in the last 72 hours. Anemia Panel: No results for  input(s): "VITAMINB12", "FOLATE", "FERRITIN", "TIBC", "IRON", "RETICCTPCT" in the last 72 hours. Sepsis Labs: No results for input(s): "PROCALCITON", "LATICACIDVEN" in the last 168 hours.  Recent Results (from the past 240 hours)  Urine Culture (for pregnant, neutropenic or urologic patients or patients with an indwelling urinary catheter)     Status: Abnormal (Preliminary result)   Collection Time: 06/30/23 10:08 PM   Specimen: Urine, Clean Catch  Result Value Ref Range Status   Specimen Description   Final    URINE, CLEAN CATCH Performed at [facility], [address], [place], [place] [address]    Special Requests   Final    NONE Performed at [facility], [address] [place], [place] [address]    Culture (A)  Final    >=100,000 COLONIES/mL ESCHERICHIA COLI CULTURE REINCUBATED FOR BETTER GROWTH SUSCEPTIBILITIES TO FOLLOW Performed at [facility], [address] [place], [place] [address]    Report Status PENDING  Incomplete  Body fluid culture w Gram Stain     Status: None (Preliminary result)   Collection Time: 07/01/23  2:29 PM   Specimen: PATH Cytology Peritoneal fluid  Result Value Ref Range Status   Specimen Description   Final    PERITONEAL Performed at [facility], [address] [place], [place] [address]    Special Requests   Final    NONE Performed at [facility], [address], [place], [place] [address]    Gram Stain   Final    NO WBC SEEN NO ORGANISMS SEEN Performed at [facility], [address], [place], [place] [address]    Culture PENDING  Incomplete   Report Status PENDING  Incomplete         Radiology Studies: US Paracentesis Result Date: 07/01/2023 INDICATION: Patient with history of congestive heart failure, alcoholic cirrhosis, renal cell carcinoma, hepatitis-C, chronic kidney disease, recurrent ascites. EXAM: ULTRASOUND GUIDED DIAGNOSTIC AND  THERAPEUTIC PARACENTESIS MEDICATIONS: 8 mL 1% lidocaine COMPLICATIONS: None immediate. PROCEDURE: Informed written consent was obtained from the patient after a discussion of the risks, benefits and alternatives to treatment. A timeout was performed prior to the initiation of the procedure. Initial ultrasound scanning demonstrates a moderate amount of ascites within the left mid to lower abdominal quadrant. The left mid to lower abdomen was prepped and draped in the usual sterile fashion. 1% lidocaine was used for local anesthesia. Following this, a 19 gauge, 10-cm, Yueh catheter was introduced. An ultrasound image was saved for documentation purposes. The paracentesis was performed. The catheter was removed and a dressing was applied. The patient tolerated the procedure well without immediate post procedural complication. FINDINGS: A total of approximately 3.5 liters of hazy, amber fluid was removed. Samples were sent to the laboratory as requested by the clinical team. IMPRESSION: Successful ultrasound-guided diagnostic and therapeutic paracentesis  yielding 3.5 liters of peritoneal fluid. PLAN: The patient has required >/=2 paracenteses in a 30 day period and a formal evaluation by the [facility] has been arranged. Performed by: Jeananne Rama, PA-C Electronically Signed   By: Marliss Coots M.D.   On: 07/01/2023 16:31   DG Chest 2 View Result Date: 06/30/2023 CLINICAL DATA:  Shortness of breath and bilateral leg swelling EXAM: CHEST - 2 VIEW COMPARISON:  12/17/2023 FINDINGS: Stable cardiomegaly. Aortic atherosclerotic calcification. Vascular congestion and vague hazy airspace opacities bilaterally. Trace bilateral pleural effusions. No pneumothorax. Embolization coils left upper quadrant. IMPRESSION: Findings suggestive of CHF with pulmonary edema. Electronically Signed   By: Minerva Fester M.D.   On: 06/30/2023 21:24        Scheduled Meds:  allopurinol   100 mg Oral Daily   atorvastatin  40 mg Oral Daily   feeding supplement  237 mL Oral BID BM   furosemide  40 mg Intravenous BID   metoprolol succinate  12.5 mg Oral Daily   pantoprazole  80 mg Oral Daily   Continuous Infusions:  cefTRIAXone (ROCEPHIN)  IV Stopped (07/02/23 0220)     LOS: 2 days    Time spent: 52 minutes spent on chart review, discussion with nursing staff, consultants, updating family and interview/physical exam; more than 50% of that time was spent in counseling and/or coordination of care.    Alvira Philips [place], DO [facility] Available via Epic secure chat 7am-7pm After these hours, please refer to coverage provider listed on [email or web address] 07/02/2023, 10:55 AM

## 2023-07-02 NOTE — Plan of Care (Signed)

## 2023-07-02 NOTE — TOC Initial Note (Signed)
 Transition of Care [facility]) - Initial/Assessment Note   Patient Details  Name: John [name] MRN: [id number] Date of Birth: [birth year]  [facility] CM/SW Contact:    Ewing Schlein, LCSW Phone Number: 07/02/2023, 2:18 PM  Clinical Narrative: OT evaluation recommended SNF. Patient agreeable to [facility] or SNF. TOC awaiting PT evaluation.  Expected Discharge Plan:  (TBD) Barriers to Discharge: Continued Medical Work up  Patient Goals and CMS Choice Patient states their goals for this hospitalization and ongoing recovery are:: Go to rehab or home with [facility]  Expected Discharge Plan and Services In-house Referral: Clinical Social Work Living arrangements for the past 2 months: Apartment  Prior Living Arrangements/Services Living arrangements for the past 2 months: Apartment Patient language and need for interpreter reviewed:: Yes Do you feel safe going back to the place where you live?: Yes      Need for Family Participation in Patient Care: No (Comment) Care giver support system in place?: Yes (comment) Criminal Activity/Legal Involvement Pertinent to Current Situation/Hospitalization: No - Comment as needed  Activities of Daily Living ADL Screening (condition at time of admission) Independently performs ADLs?: Yes (appropriate for developmental age) Is the patient deaf or have difficulty hearing?: No Does the patient have difficulty seeing, even when wearing glasses/contacts?: No Does the patient have difficulty concentrating, remembering, or making decisions?: No  Emotional Assessment Attitude/Demeanor/Rapport: Engaged Affect (typically observed): Accepting Orientation: : Oriented to Self, Oriented to Place, Oriented to  Time, Oriented to Situation Alcohol / Substance Use: Not Applicable Psych Involvement: No (comment)  Admission diagnosis:  Peripheral edema [R60.0] Acute exacerbation of CHF (congestive heart failure) (HCC) [I50.9] Ascites due to alcoholic cirrhosis  (HCC) [K70.31] Acute on chronic congestive heart failure, unspecified heart failure type (HCC) [I50.9] Patient Active Problem List   Diagnosis Date Noted   Chronic anemia 07/01/2023   Acute exacerbation of CHF (congestive heart failure) (HCC) 06/30/2023   History of hepatitis C 05/14/2023   Portal hypertension (HCC) 05/14/2023   Decompensated cirrhosis (HCC) 05/14/2023   Atrial flutter (HCC) 05/12/2023   Hypomagnesemia 05/08/2023   New onset atrial fibrillation (HCC) 05/07/2023   Stasis dermatitis of both legs 08/05/2022   Glaucoma (increased eye pressure) 08/05/2022   COPD with acute exacerbation (HCC) 08/05/2022   Acute exacerbation of congestive heart failure (HCC) 07/31/2022   Renal mass, left 07/05/2020   Umbilical hernia without obstruction and without gangrene 04/27/2019   Severe obesity (BMI 35.0-39.9) with comorbidity (HCC) 06/25/2018   Acute gouty arthritis 05/06/2018   Polyarthralgia 05/03/2018   History of renal cell carcinoma 01/12/2018   Esophageal varices without bleeding (HCC)    Renal cell carcinoma of left kidney (HCC) 11/08/2016   Malnutrition of moderate degree 07/20/2015   (HFpEF) heart failure with preserved ejection fraction (HCC) 07/19/2015   Abdominal pain 07/19/2015   Renal failure (ARF), acute on chronic (HCC) 07/19/2015   Acute-on-chronic kidney injury (HCC)    Hyponatremia 07/07/2015   Dehydration 07/07/2015   Acute respiratory failure (HCC) 07/07/2015   CKD stage 3a, GFR 45-59 ml/min (HCC) 05/25/2015   CAP (community acquired pneumonia) 05/23/2015   Acute respiratory failure with hypoxia (HCC) 05/23/2015   Fluid overload 10/31/2014   Hepatitis C 10/31/2014   Ascites of liver 10/31/2014   Hepatitis, viral    Edema of abdominal wall    Pedal edema    Alcoholism (HCC)    Abdominal edema on examination 09/01/2014   Chronic liver failure (HCC)    Alcoholic cirrhosis of liver with ascites (  HCC)    Chronic hepatitis C with cirrhosis (HCC)     Cardiac hypertrophy    Fever of unknown origin    Diastolic dysfunction    Altered mental status    Metabolic encephalopathy    Pulmonary edema    Acute encephalopathy    Ascites due to alcoholic cirrhosis (HCC)    Hypernatremia    Other specified fever    Varices, gastric    Acute on chronic renal failure (HCC)    Elevated transaminase level    Essential hypertension    Gastric varices    Upper GI bleed 08/14/2014   Acute blood loss anemia 08/14/2014   Alcohol abuse 08/14/2014   Coagulopathy (HCC) 08/14/2014   AKI (acute kidney injury) (HCC) 08/14/2014   GI bleed 08/14/2014   GIB (gastrointestinal bleeding) 08/14/2014   Bleeding gastric varices 08/14/2014   Acute upper GI bleed    Cirrhosis (HCC) 07/27/2014   Abdominal pain, generalized 07/27/2014   Ascites 07/26/2014   Optic neuritis 07/11/2014   Abnormal LFTs    Vision loss of left eye 06/24/2014   Accelerated hypertension 06/24/2014   Elevated LFTs 06/24/2014   Polysubstance abuse (HCC) 06/24/2014   Stage 3b chronic kidney disease (HCC) 06/24/2014   SOB (shortness of breath) 06/24/2014   Vision loss, left eye 06/24/2014   Thrombocytopenia (HCC) 06/24/2014   Hypokalemia 06/24/2014   Elevated troponin 06/24/2014   Essential hypertension, benign 12/29/2012   Neck pain 12/29/2012   Low back pain 12/29/2012   PCP:  Patient, No Pcp Per Pharmacy:   [facility] [address] [place] [address] Phone: [phone] Fax: [phone]  Social Drivers of Health (SDOH) Social History: SDOH Screenings   Food Insecurity: Food Insecurity Present (07/01/2023)  Housing: Low Risk  (07/01/2023)  Transportation Needs: No Transportation Needs (07/01/2023)  Utilities: Not At Risk (07/01/2023)  Social Connections: Moderately Isolated (07/01/2023)  Tobacco Use: Medium Risk (06/30/2023)   SDOH Interventions:    Readmission Risk Interventions     No data to display

## 2023-07-03 DIAGNOSIS — I5033 Acute on chronic diastolic (congestive) heart failure: Secondary | ICD-10-CM | POA: Diagnosis not present

## 2023-07-03 DIAGNOSIS — K7290 Hepatic failure, unspecified without coma: Secondary | ICD-10-CM | POA: Diagnosis not present

## 2023-07-03 DIAGNOSIS — K7460 Unspecified cirrhosis of liver: Secondary | ICD-10-CM | POA: Diagnosis not present

## 2023-07-03 DIAGNOSIS — Z91199 Patient's noncompliance with other medical treatment and regimen due to unspecified reason: Secondary | ICD-10-CM | POA: Diagnosis not present

## 2023-07-03 LAB — BASIC METABOLIC PANEL
Anion gap: 12 (ref 5–15)
BUN: 26 mg/dL — ABNORMAL HIGH (ref 8–23)
CO2: 25 mmol/L (ref 22–32)
Calcium: 8.7 mg/dL — ABNORMAL LOW (ref 8.9–10.3)
Chloride: 100 mmol/L (ref 98–111)
Creatinine, Ser: 1.9 mg/dL — ABNORMAL HIGH (ref 0.61–1.24)
GFR, Estimated: 38 mL/min — ABNORMAL LOW (ref >60)
Glucose, Bld: 87 mg/dL (ref 70–99)
Potassium: 4.3 mmol/L (ref 3.5–5.1)
Sodium: 137 mmol/L (ref 135–145)

## 2023-07-03 LAB — URINE CULTURE: Culture: >=100000 — AB

## 2023-07-03 LAB — CYTOLOGY - NON PAP

## 2023-07-03 NOTE — Progress Notes (Signed)
 PROGRESS NOTE    John [name]  WUJ:[id number] DOB: [birth year] DOA: 06/30/2023 PCP: Patient, No Pcp Per    Brief Narrative:   John [name] is a 68 y.o. male with past medical history significant for chronic diastolic congestive heart failure, HTN, CKD stage IIIa, EtOH cirrhosis with varices and ascites requiring serial paracentesis, EtOH abuse in remission (stopped drinking 10 years ago), COPD, renal cell carcinoma s/p cryoablation, hepatitis C, seizure disorder, polysubstance abuse (cocaine/marijuana), anemia, thrombocytopenia, gout, PAD, CVA, obesity who presented to [facility] ED on 06/30/2023 via EMS from home with progressive lower extremity edema, abdominal pain with worsening distention in the setting of noncompliance with his home medication.  Also endorses mild shortness of breath.  Denies nausea/vomiting, no issues with urination or bowel movements.  Reports he ran out of his home Lasix about a month ago and did not get it refilled.  Recent hospitalization in which she underwent paracentesis twice; he was found to have new onset A-fib/flutter which she was not a good candidate for anticoagulation due to history of cirrhosis, esophageal and gastric varices, prior GI bleed.  In the ED, temperature 98.3 F, HR 88, RR 18, BP 120/97, SpO2 98% on room air.  WBC 5.1, hemoglobin 9.3, platelet count 116.  Sodium 138, potassium 3.7, chloride 105, CO2 24, glucose 89, BUN 18, Cr 1.79.  AST 17, ALT 9, total bilirubin 1.2.  BNP 199.2.  INR 1.3.  Urinalysis with large leukocytes, negative nitrite, many bacteria, greater than 50 WBCs.  Chest x-ray with pulmonary edema.  EKG with atrial flutter, rate 88.  Patient was given 60 mg IV Lasix.  TRH consulted for admission for further evaluation management of decompensated cirrhosis, CHF exacerbation in the setting of medication noncompliance.    Assessment & Plan:   Acute on chronic diastolic congestive heart failure Patient presenting to  ED with progressive lower extremity edema, abdominal distention, shortness of breath.  Patient is afebrile without leukocytosis.  Chest x-ray notable for pulmonary edema with elevated BNP of 199.2.  Noncompliant with home Aldactone and Lasix.  TTE 05/10/2023 with LVEF 55 to 60%, RV systolic function low normal, normal PASP, left atrium moderately dilated, mild mitral regurgitation. -- Furosemide 40 mg IV twice daily -- 2 g sodium restricted diet; 1200 mL fluid restriction -- Strict I's and O's and daily weights -- BMP daily  EtOH cirrhosis with ascites, decompensated Patient presenting with progressive abdominal distention in the setting of noncompliance with his home furosemide and spironolactone. -- s/p nonguided paracentesis by IR on 07/01/2023 with 3.5 L fluid removed -- Peritoneal fluid culture with no organisms on Gram stain, no growth less than 24 hours -- Continue Lasix as above; fluid restriction, low-salt diet  Ecoli Urinary tract infection Urinalysis with large leukocytes, negative nitrite, many bacteria, greater than 50 WBCs. -- Urine culture: >100K Ecoli -- Ceftriaxone 2 g IV every 24 hours x 5 days  CKD stage IIIa Baseline creatinine 1.8 - 2.0.  Creatinine on admission 1.92, stable -- BMP daily  Essential hypertension Persistent atrial for flutter Rate controlled.  Not on anticoagulation due to history of cirrhosis, esophageal varices and prior GI bleed. -- Metoprolol succinate 12.5 mg p.o. daily -- Continue monitor on telemetry  COPD Not oxygen dependent, on room air with adequate SpO2. -- Albuterol neb every 4 hours as needed wheezing/shortness of breath  Renal cell carcinoma S/p cryoablation.  Outpatient follow-up with urology  PAD Hx CVA -- Atorvastatin 40 mg p.o. daily -- Not on  antiplatelet due to thrombocytopenia, anemia  Anemia/thrombocytopenia, chronic Hemoglobin 9.0, platelet count 104, stable.  Hx gout -- Allopurinol 100 mg p.o. daily  GERD --  Protonix 80 mg p.o. daily (substituted for home Nexium)  Obesity class II Body mass index is 36.19 kg/m.    DVT prophylaxis: SCDs Start: 07/01/23 0144    Code Status: Full Code Family Communication: No family present at bedside this morning  Disposition Plan:  Level of care: Telemetry Status is: Inpatient Remains inpatient appropriate because: IV diuresis, likely need of SNF placement, TOC consulted    Consultants:  Interventional radiology  Procedures:  Paracentesis:  Antimicrobials:  Ceftriaxone 3/3>>   Subjective: Patient seen examined bedside, lying in bed.  Watching TV.  Reports lower extremity edema improved, abdomin not as much distended.  Continues on IV furosemide with good urine output.  Dyspnea also improved.  Discussed with patient that he may benefit from SNF placement, agreeable.   No other specific complaints, concerns or questions at this time.  Denies headache, no chest pain, no palpitations, no fever/chills/night sweats, no cough/congestion, no nausea/vomiting/diarrhea, no focal weakness, no fatigue, no paresthesias.  Objective: Vitals:   07/02/23 2004 07/03/23 0435 07/03/23 0438 07/03/23 0833  BP: 103/78 100/78  108/85  Pulse: 84 82  85  Resp: 20 19    Temp: 97.6 F (36.4 C) 97.6 F (36.4 C)    TempSrc: Oral Oral    SpO2: 97% 94%    Weight:   104.8 kg   Height:        Intake/Output Summary (Last 24 hours) at 07/03/2023 1050 Last data filed at 07/03/2023 1000 Gross per 24 hour  Intake 1670 ml  Output 1220 ml  Net 450 ml   Filed Weights   06/30/23 1633 07/02/23 0411 07/03/23 0438  Weight: 105.2 kg 103.2 kg 104.8 kg    Examination:  Physical Exam: GEN: NAD, alert and oriented x 3, chronically ill appearance, appears older than stated age HEENT: NCAT, PERRL, EOMI, sclera clear, MMM PULM: Breath sounds slightly diminished bilateral bases with crackles, no wheezing, normal respiratory effort without accessory muscle use, on room air with SpO2  97% CV: IRR w/o M/G/R GI: abd soft, slight generalized TTP, + distention, + BS MSK: + lower extremity peripheral edema, muscle strength globally intact 5/5 bilateral upper/lower extremities NEURO: CN II-XII intact, no focal deficits, sensation to light touch intact PSYCH: normal mood/affect Integumentary: No concerning rashes/lesions/wounds noted on exposed skin surfaces.    Data Reviewed: I have personally reviewed following labs and imaging studies  CBC: Recent Labs  Lab 06/30/23 1643 07/01/23 0525 07/02/23 0400  WBC 5.1 4.9 5.1  HGB 9.3* 9.0* 9.1*  HCT 30.4* 28.1* 29.7*  MCV 80.6 80.7 81.6  PLT 116* 104* 105*   Basic Metabolic Panel: Recent Labs  Lab 06/30/23 1643 07/01/23 0525 07/02/23 0400 07/03/23 0357  NA 138 141 137 137  K 3.7 3.5 4.0 4.3  CL 105 103 101 100  CO2 24 27 27 25   GLUCOSE 89 98 96 87  BUN 18 19 21  26*  CREATININE 1.79* 1.76* 1.92* 1.90*  CALCIUM 9.0 8.7* 8.7* 8.7*  MG  --   --  1.6*  --    GFR: Estimated Creatinine Clearance: 44.1 mL/min (A) (by C-G formula based on SCr of 1.9 mg/dL (H)). Liver Function Tests: Recent Labs  Lab 06/30/23 1643  AST 17  ALT 9  ALKPHOS 55  BILITOT 1.2  PROT 7.3  ALBUMIN 3.4*   Recent Labs  Lab 06/30/23 1643  LIPASE 31   No results for input(s): "AMMONIA" in the last 168 hours. Coagulation Profile: Recent Labs  Lab 06/30/23 2134  INR 1.3*   Cardiac Enzymes: No results for input(s): "CKTOTAL", "CKMB", "CKMBINDEX", "TROPONINI" in the last 168 hours. BNP (last 3 results) Recent Labs    11/05/22 1358 11/28/22 0849  PROBNP 3,248* 4,858*   HbA1C: No results for input(s): "HGBA1C" in the last 72 hours. CBG: No results for input(s): "GLUCAP" in the last 168 hours. Lipid Profile: No results for input(s): "CHOL", "HDL", "LDLCALC", "TRIG", "CHOLHDL", "LDLDIRECT" in the last 72 hours. Thyroid Function Tests: No results for input(s): "TSH", "T4TOTAL", "FREET4", "T3FREE", "THYROIDAB" in the last 72  hours. Anemia Panel: No results for input(s): "VITAMINB12", "FOLATE", "FERRITIN", "TIBC", "IRON", "RETICCTPCT" in the last 72 hours. Sepsis Labs: No results for input(s): "PROCALCITON", "LATICACIDVEN" in the last 168 hours.  Recent Results (from the past 240 hours)  Urine Culture (for pregnant, neutropenic or urologic patients or patients with an indwelling urinary catheter)     Status: Abnormal   Collection Time: 06/30/23 10:08 PM   Specimen: Urine, Clean Catch  Result Value Ref Range Status   Specimen Description   Final    URINE, CLEAN CATCH Performed at [facility], [address] [place], [place] [address]    Special Requests   Final    NONE Performed at [facility], [address], [place], [place] [address]    Culture >=100,000 COLONIES/mL ESCHERICHIA COLI (A)  Final   Report Status 07/03/2023 FINAL  Final   Organism ID, Bacteria ESCHERICHIA COLI (A)  Final      Susceptibility   Escherichia coli - MIC*    AMPICILLIN <=2 SENSITIVE Sensitive     CEFAZOLIN <=4 SENSITIVE Sensitive     CEFEPIME <=0.12 SENSITIVE Sensitive     CEFTRIAXONE <=0.25 SENSITIVE Sensitive     CIPROFLOXACIN <=0.25 SENSITIVE Sensitive     GENTAMICIN <=1 SENSITIVE Sensitive     IMIPENEM <=0.25 SENSITIVE Sensitive     NITROFURANTOIN <=16 SENSITIVE Sensitive     TRIMETH/SULFA <=20 SENSITIVE Sensitive     AMPICILLIN/SULBACTAM <=2 SENSITIVE Sensitive     PIP/TAZO <=4 SENSITIVE Sensitive ug/mL    * >=100,000 COLONIES/mL ESCHERICHIA COLI  Body fluid culture w Gram Stain     Status: None (Preliminary result)   Collection Time: 07/01/23  2:29 PM   Specimen: PATH Cytology Peritoneal fluid  Result Value Ref Range Status   Specimen Description   Final    PERITONEAL Performed at [facility], [address] [place], [place] [address]    Special Requests   Final    NONE Performed at [facility], [address] [place],  [place] [address]    Gram Stain NO WBC SEEN NO ORGANISMS SEEN   Final   Culture   Final    NO GROWTH < 24 HOURS Performed at [facility], [address] [place], [place] [address]    Report Status PENDING  Incomplete         Radiology Studies: US Paracentesis Result Date: 07/01/2023 INDICATION: Patient with history of congestive heart failure, alcoholic cirrhosis, renal cell carcinoma, hepatitis-C, chronic kidney disease, recurrent ascites. EXAM: ULTRASOUND GUIDED DIAGNOSTIC AND THERAPEUTIC PARACENTESIS MEDICATIONS: 8 mL 1% lidocaine COMPLICATIONS: None immediate. PROCEDURE: Informed written consent was obtained from the patient after a discussion of the risks, benefits and alternatives to treatment. A timeout was performed prior to the  initiation of the procedure. Initial ultrasound scanning demonstrates a moderate amount of ascites within the left mid to lower abdominal quadrant. The left mid to lower abdomen was prepped and draped in the usual sterile fashion. 1% lidocaine was used for local anesthesia. Following this, a 19 gauge, 10-cm, Yueh catheter was introduced. An ultrasound image was saved for documentation purposes. The paracentesis was performed. The catheter was removed and a dressing was applied. The patient tolerated the procedure well without immediate post procedural complication. FINDINGS: A total of approximately 3.5 liters of hazy, amber fluid was removed. Samples were sent to the laboratory as requested by the clinical team. IMPRESSION: Successful ultrasound-guided diagnostic and therapeutic paracentesis yielding 3.5 liters of peritoneal fluid. PLAN: The patient has required >/=2 paracenteses in a 30 day period and a formal evaluation by the [facility] has been arranged. Performed by: Jeananne Rama, PA-C Electronically Signed   By: Marliss Coots M.D.   On: 07/01/2023 16:31        Scheduled Meds:  allopurinol  100 mg Oral  Daily   atorvastatin  40 mg Oral Daily   feeding supplement  237 mL Oral BID BM   furosemide  40 mg Intravenous BID   metoprolol succinate  12.5 mg Oral Daily   pantoprazole  80 mg Oral Daily   Continuous Infusions:  cefTRIAXone (ROCEPHIN)  IV 2 g (07/03/23 0108)     LOS: 3 days    Time spent: 52 minutes spent on chart review, discussion with nursing staff, consultants, updating family and interview/physical exam; more than 50% of that time was spent in counseling and/or coordination of care.    Alvira Philips [place], DO [facility] Available via Epic secure chat 7am-7pm After these hours, please refer to coverage provider listed on [email or web address] 07/03/2023, 10:50 AM

## 2023-07-03 NOTE — Evaluation (Signed)
 Physical Therapy Evaluation Patient Details Name: John [name] MRN: [id number] DOB: [birth year] Today's Date: 07/03/2023  History of Present Illness  Patient is a 68 year old male who presented to ED on 3/3 with abdominal pain,distention with noncompliance with home meds. Patient was admitted with acute on chronic diastolic congestive heart failure, EtOH cirrhosis with ascites, UTI. Successful ultrasound guided paracentesis from the left lower quadrant, yielded 3.5 liters of hazy,amber fluid 3/4. PMH: CKD III, COPD, renal cell carcinoma, PAD, GERD, obesity.  Clinical Impression  Pt admitted with above diagnosis. Pt from home alone, reports ind holding to furniture while ambulating around the home, using various AD in community as needed, endorses falls, reports has life alert button but doesn't use it because he keeps the doors locked, also states niece is available PRN. On eval, pt needing min A with bed mobility, increased effort using bedrails and elevated HOB and needing assist to upright trunk into sitting. Pt needing min A to power up from elevated bed, slow to power up, knee extension slowly occurring, UE assist on RW to assist in powering up. Pt amb with RW, slow cadence, minimal foot clearance to shuffling step progression, no overt LOB but slightly unsteady and needing increased time to clear obstacles in hallway. HR noted 133 max with amb, improves to 90s with return to supine once back in room. Patient will benefit from continued inpatient follow up therapy, <3 hours/day prior to return home alone. Pt currently with functional limitations due to the deficits listed below (see PT Problem List). Pt will benefit from acute skilled PT to increase their independence and safety with mobility to allow discharge.           If plan is discharge home, recommend the following: A little help with walking and/or transfers;A little help with bathing/dressing/bathroom;Assistance with  cooking/housework;Assist for transportation;Help with stairs or ramp for entrance   Can travel by private vehicle   Yes    Equipment Recommendations None recommended by PT  Recommendations for Other Services       Functional Status Assessment Patient has had a recent decline in their functional status and demonstrates the ability to make significant improvements in function in a reasonable and predictable amount of time.     Precautions / Restrictions Precautions Precautions: Fall Restrictions Weight Bearing Restrictions Per Provider Order: No      Mobility  Bed Mobility Overal bed mobility: Needs Assistance Bed Mobility: Supine to Sit, Sit to Supine     Supine to sit: Min assist, HOB elevated, Used rails Sit to supine: Min assist   General bed mobility comments: increased time and effort using bedrail and elevated HOB, needing min A to upright trunk into sitting; min A to lift BLE Back into bed    Transfers Overall transfer level: Needs assistance Equipment used: Rolling walker (2 wheels) Transfers: Sit to/from Stand Sit to Stand: Min assist, From elevated surface           General transfer comment: min A to power up from elevated bed, cues for hand placement, slow to rise with knee extension occurring last; uncontrolled descent back to sitting despite cues and UE assist    Ambulation/Gait Ambulation/Gait assistance: Contact guard assist Gait Distance (Feet): 100 Feet Assistive device: Rolling walker (2 wheels) Gait Pattern/deviations: Step-through pattern, Decreased stride length Gait velocity: decreased     General Gait Details: step through gait pattern, minimal bil foot clearance wtih occasional shuffling step progression, no knee buckling, denies pain in R  knee, no overt LOB but slightly unsteady, increased time when clearing obstacles in hallway  Stairs            Wheelchair Mobility     Tilt Bed    Modified Rankin (Stroke Patients Only)        Balance Overall balance assessment: Needs assistance Sitting-balance support: Feet supported Sitting balance-Leahy Scale: Good     Standing balance support: Reliant on assistive device for balance, During functional activity, Bilateral upper extremity supported Standing balance-Leahy Scale: Poor                               Pertinent Vitals/Pain Pain Assessment Pain Assessment: No/denies pain    Home Living Family/patient expects to be discharged to:: Private residence Living Arrangements: Alone Available Help at Discharge: Friend(s) Type of Home: Apartment Home Access: Elevator       Home Layout: One level Home Equipment: Rollator (4 wheels);Rolling Walker (2 wheels);Cane - single point;Grab bars - tub/shower;Grab bars - toilet;BSC/3in1      Prior Function Prior Level of Function : Independent/Modified Independent             Mobility Comments: pt reports ind with amb in the home furniture walking, uses SPC or RW or rollator as needed in community ADLs Comments: pt reports ind     Extremity/Trunk Assessment   Upper Extremity Assessment Upper Extremity Assessment: Defer to OT evaluation    Lower Extremity Assessment Lower Extremity Assessment: Generalized weakness (AROM WFL, strength grossly 3+/5, denies numbness/tingling)    Cervical / Trunk Assessment Cervical / Trunk Assessment: Normal  Communication   Communication Communication: No apparent difficulties    Cognition Arousal: Alert Behavior During Therapy: WFL for tasks assessed/performed   PT - Cognitive impairments: No apparent impairments                         Following commands: Intact       Cueing       General Comments      Exercises     Assessment/Plan    PT Assessment Patient needs continued PT services  PT Problem List Decreased strength;Decreased activity tolerance;Decreased balance;Decreased mobility;Decreased knowledge of use of DME;Decreased  safety awareness;Obesity       PT Treatment Interventions DME instruction;Gait training;Functional mobility training;Therapeutic exercise;Therapeutic activities;Balance training;Cognitive remediation;Patient/family education    PT Goals (Current goals can be found in the Care Plan section)  Acute Rehab PT Goals Patient Stated Goal: regain strength PT Goal Formulation: With patient Time For Goal Achievement: 07/17/23 Potential to Achieve Goals: Good    Frequency Min 2X/week     Co-evaluation               AM-PAC PT "6 Clicks" Mobility  Outcome Measure Help needed turning from your back to your side while in a flat bed without using bedrails?: A Little Help needed moving from lying on your back to sitting on the side of a flat bed without using bedrails?: A Little Help needed moving to and from a bed to a chair (including a wheelchair)?: A Little Help needed standing up from a chair using your arms (e.g., wheelchair or bedside chair)?: A Little Help needed to walk in hospital room?: A Little Help needed climbing 3-5 steps with a railing? : A Lot 6 Click Score: 17    End of Session Equipment Utilized During Treatment: Gait belt Activity Tolerance: Patient tolerated  treatment well Patient left: in bed;with call bell/phone within reach;with bed alarm set Nurse Communication: Mobility status PT Visit Diagnosis: Unsteadiness on feet (R26.81);Muscle weakness (generalized) (M62.81);History of falling (Z91.81)    Time: 6578-4696 PT Time Calculation (min) (ACUTE ONLY): 16 min   Charges:   PT Evaluation $PT Eval Low Complexity: 1 Low   PT General Charges $$ ACUTE PT VISIT: 1 Visit         Tori Jeferson Boozer PT, DPT 07/03/23, 12:50 PM

## 2023-07-03 NOTE — TOC Progression Note (Signed)
 Transition of Care [facility]) - Progression Note   Patient Details  Name: John [name] MRN: [id number] Date of Birth: Mar 17, 1956  [facility] CM/SW Contact  Ewing Schlein, LCSW Phone Number: 07/03/2023, 2:48 PM  Clinical Narrative: [facility] consulted for CHF screening, but patient has already been referred to the Heart Failure Navigation team. CSW met with patient to discuss PT's SNF recommendation and he is agreeable to being faxed out. FL2 done; PASRR confirmed. Initial referral faxed out for review. TOC awaiting bed offers.   Expected Discharge Plan: Skilled Nursing Facility Barriers to Discharge: Continued Medical Work up, SNF Pending bed offer, English as a second language teacher  Expected Discharge Plan and Services In-house Referral: Clinical Social Work Post Acute Care Choice: Skilled Nursing Facility Living arrangements for the past 2 months: Apartment             DME Arranged: N/A DME Agency: NA  Social Determinants of Health (SDOH) Interventions SDOH Screenings   Food Insecurity: Food Insecurity Present (07/01/2023)  Housing: Low Risk  (07/01/2023)  Transportation Needs: No Transportation Needs (07/01/2023)  Utilities: Not At Risk (07/01/2023)  Social Connections: Moderately Isolated (07/01/2023)  Tobacco Use: Medium Risk (06/30/2023)   Readmission Risk Interventions    07/02/2023    2:22 PM  Readmission Risk Prevention Plan  Transportation Screening Complete  Medication Review (RN Care Manager) Complete  HRI or Home Care Consult Complete  SW Recovery Care/Counseling Consult Complete  Palliative Care Screening Not Applicable  Skilled Nursing Facility Complete

## 2023-07-03 NOTE — [place] FL2 (Signed)
 [place] MEDICAID FL2 LEVEL OF CARE FORM     IDENTIFICATION  Patient Name: John [name] Birthdate: November 30, 1955 Sex: male Admission Date (Current Location): 06/30/2023  [facility] and [place] Number:  Producer, television/film/video and Address:  [facility],  [address] [place], [place] [address]      Provider Number: [id number]  Attending Physician Name and Address:  U[place], Eric J, DO  Relative Name and Phone Number:  Whitt Auletta (niece) Ph: [phone]    Current Level of Care: Hospital Recommended Level of Care: Skilled Nursing Facility Prior Approval Number:    Date Approved/Denied:   PASRR Number: [id number]  Discharge Plan: SNF    Current Diagnoses: Patient Active Problem List   Diagnosis Date Noted   Chronic anemia 07/01/2023   Acute exacerbation of CHF (congestive heart failure) (HCC) 06/30/2023   History of hepatitis C 05/14/2023   Portal hypertension (HCC) 05/14/2023   Decompensated cirrhosis (HCC) 05/14/2023   Atrial flutter (HCC) 05/12/2023   Hypomagnesemia 05/08/2023   New onset atrial fibrillation (HCC) 05/07/2023   Stasis dermatitis of both legs 08/05/2022   Glaucoma (increased eye pressure) 08/05/2022   COPD with acute exacerbation (HCC) 08/05/2022   Acute exacerbation of congestive heart failure (HCC) 07/31/2022   Renal mass, left 07/05/2020   Umbilical hernia without obstruction and without gangrene 04/27/2019   Severe obesity (BMI 35.0-39.9) with comorbidity (HCC) 06/25/2018   Acute gouty arthritis 05/06/2018   Polyarthralgia 05/03/2018   History of renal cell carcinoma 01/12/2018   Esophageal varices without bleeding (HCC)    Renal cell carcinoma of left kidney (HCC) 11/08/2016   Malnutrition of moderate degree 07/20/2015   (HFpEF) heart failure with preserved ejection fraction (HCC) 07/19/2015   Abdominal pain 07/19/2015   Renal failure (ARF), acute on chronic (HCC) 07/19/2015   Acute-on-chronic kidney injury (HCC)     Hyponatremia 07/07/2015   Dehydration 07/07/2015   Acute respiratory failure (HCC) 07/07/2015   CKD stage 3a, GFR 45-59 ml/min (HCC) 05/25/2015   CAP (community acquired pneumonia) 05/23/2015   Acute respiratory failure with hypoxia (HCC) 05/23/2015   Fluid overload 10/31/2014   Hepatitis C 10/31/2014   Ascites of liver 10/31/2014   Hepatitis, viral    Edema of abdominal wall    Pedal edema    Alcoholism (HCC)    Abdominal edema on examination 09/01/2014   Chronic liver failure (HCC)    Alcoholic cirrhosis of liver with ascites (HCC)    Chronic hepatitis C with cirrhosis (HCC)    Cardiac hypertrophy    Fever of unknown origin    Diastolic dysfunction    Altered mental status    Metabolic encephalopathy    Pulmonary edema    Acute encephalopathy    Ascites due to alcoholic cirrhosis (HCC)    Hypernatremia    Other specified fever    Varices, gastric    Acute on chronic renal failure (HCC)    Elevated transaminase level    Essential hypertension    Gastric varices    Upper GI bleed 08/14/2014   Acute blood loss anemia 08/14/2014   Alcohol abuse 08/14/2014   Coagulopathy (HCC) 08/14/2014   AKI (acute kidney injury) (HCC) 08/14/2014   GI bleed 08/14/2014   GIB (gastrointestinal bleeding) 08/14/2014   Bleeding gastric varices 08/14/2014   Acute upper GI bleed    Cirrhosis (HCC) 07/27/2014   Abdominal pain, generalized 07/27/2014   Ascites 07/26/2014   Optic neuritis 07/11/2014   Abnormal LFTs    Vision loss  of left eye 06/24/2014   Accelerated hypertension 06/24/2014   Elevated LFTs 06/24/2014   Polysubstance abuse (HCC) 06/24/2014   Stage 3b chronic kidney disease (HCC) 06/24/2014   SOB (shortness of breath) 06/24/2014   Vision loss, left eye 06/24/2014   Thrombocytopenia (HCC) 06/24/2014   Hypokalemia 06/24/2014   Elevated troponin 06/24/2014   Essential hypertension, benign 12/29/2012   Neck pain 12/29/2012   Low back pain 12/29/2012    Orientation  RESPIRATION BLADDER Height & Weight     Self, Time, Situation, Place  Normal Continent Weight: 231 lb 0.7 oz (104.8 kg) Height:  5\' 7"  (170.2 cm)  BEHAVIORAL SYMPTOMS/MOOD NEUROLOGICAL BOWEL NUTRITION STATUS      Continent Diet (2 gram sodium diet)  AMBULATORY STATUS COMMUNICATION OF NEEDS Skin   Limited Assist Verbally Normal                       Personal Care Assistance Level of Assistance  Bathing, Feeding, Dressing Bathing Assistance: Limited assistance Feeding assistance: Independent Dressing Assistance: Limited assistance     Functional Limitations Info  Sight, Hearing, Speech Sight Info: Impaired Hearing Info: Adequate Speech Info: Adequate    SPECIAL CARE FACTORS FREQUENCY  PT (By licensed PT), OT (By licensed OT)     PT Frequency: 5x's/week OT Frequency: 5x's/week            Contractures Contractures Info: Not present    Additional Factors Info  Code Status, Allergies Code Status Info: Full Allergies Info: Penicillins           Current Medications (07/03/2023):  This is the current hospital active medication list Current Facility-Administered Medications  Medication Dose Route Frequency Provider Last Rate Last Admin   albuterol (PROVENTIL) (2.5 MG/3ML) 0.083% nebulizer solution 2.5 mg  2.5 mg Nebulization Q4H PRN [place], Eric J, DO       allopurinol (ZYLOPRIM) tablet 100 mg  100 mg Oral Daily [place], Alvira Philips, DO   100 mg at 07/03/23 0839   atorvastatin (LIPITOR) tablet 40 mg  40 mg Oral Daily [place], Alvira Philips, DO   40 mg at 07/03/23 1610   cefTRIAXone (ROCEPHIN) 2 g in sodium chloride 0.9 % 100 mL IVPB  2 g Intravenous Q24H John Giovanni, MD 200 mL/hr at 07/03/23 0108 2 g at 07/03/23 0108   feeding supplement (ENSURE ENLIVE / ENSURE PLUS) liquid 237 mL  237 mL Oral BID BM [place], Eric J, DO   237 mL at 07/03/23 1016   furosemide (LASIX) injection 40 mg  40 mg Intravenous BID [place], Eric J, DO   40 mg at 07/03/23 9604   metoprolol succinate  (TOPROL-XL) 24 hr tablet 12.5 mg  12.5 mg Oral Daily [place], Eric J, DO   12.5 mg at 07/03/23 0833   ondansetron (ZOFRAN) injection 4 mg  4 mg Intravenous Q6H PRN [place], Eric J, DO       pantoprazole (PROTONIX) EC tablet 80 mg  80 mg Oral Daily [place], Eric J, DO   80 mg at 07/03/23 5409   traMADol (ULTRAM) tablet 50 mg  50 mg Oral Q6H PRN [place], Eric J, DO   50 mg at 07/01/23 1555     Discharge Medications: Please see discharge summary for a list of discharge medications.  Relevant Imaging Results:  Relevant Lab Results:   Additional Information SSN: [id number]  Ewing Schlein, LCSW

## 2023-07-04 ENCOUNTER — Encounter (HOSPITAL_COMMUNITY): Payer: Self-pay | Admitting: *Deleted

## 2023-07-04 DIAGNOSIS — D696 Thrombocytopenia, unspecified: Secondary | ICD-10-CM

## 2023-07-04 DIAGNOSIS — I5033 Acute on chronic diastolic (congestive) heart failure: Secondary | ICD-10-CM | POA: Diagnosis not present

## 2023-07-04 DIAGNOSIS — I4892 Unspecified atrial flutter: Secondary | ICD-10-CM

## 2023-07-04 DIAGNOSIS — K7031 Alcoholic cirrhosis of liver with ascites: Secondary | ICD-10-CM

## 2023-07-04 LAB — BASIC METABOLIC PANEL
Anion gap: 13 (ref 5–15)
BUN: 31 mg/dL — ABNORMAL HIGH (ref 8–23)
CO2: 26 mmol/L (ref 22–32)
Calcium: 8.6 mg/dL — ABNORMAL LOW (ref 8.9–10.3)
Chloride: 99 mmol/L (ref 98–111)
Creatinine, Ser: 1.94 mg/dL — ABNORMAL HIGH (ref 0.61–1.24)
GFR, Estimated: 37 mL/min — ABNORMAL LOW (ref >60)
Glucose, Bld: 94 mg/dL (ref 70–99)
Potassium: 4.4 mmol/L (ref 3.5–5.1)
Sodium: 138 mmol/L (ref 135–145)

## 2023-07-04 LAB — BODY FLUID CULTURE W GRAM STAIN
Culture: NO GROWTH
Gram Stain: NONE SEEN

## 2023-07-04 LAB — CBC
HCT: 28.4 % — ABNORMAL LOW (ref 39.0–52.0)
Hemoglobin: 8.8 g/dL — ABNORMAL LOW (ref 13.0–17.0)
MCH: 25.2 pg — ABNORMAL LOW (ref 26.0–34.0)
MCHC: 31 g/dL (ref 30.0–36.0)
MCV: 81.4 fL (ref 80.0–100.0)
Platelets: 103 10*3/uL — ABNORMAL LOW (ref 150–400)
RBC: 3.49 MIL/uL — ABNORMAL LOW (ref 4.22–5.81)
RDW: 18.4 % — ABNORMAL HIGH (ref 11.5–15.5)
WBC: 4.8 10*3/uL (ref 4.0–10.5)
nRBC: 0 % (ref 0.0–0.2)

## 2023-07-04 MED ORDER — POLYETHYLENE GLYCOL 3350 17 G PO PACK
17.0000 g | PACK | Freq: Every day | ORAL | Status: DC
  Administered 2023-07-05 – 2023-07-08 (×4): 17 g via ORAL
  Filled 2023-07-04 (×4): qty 1

## 2023-07-04 MED ORDER — CEPHALEXIN 500 MG PO CAPS
500.0000 mg | ORAL_CAPSULE | Freq: Two times a day (BID) | ORAL | Status: AC
  Administered 2023-07-05 – 2023-07-08 (×6): 500 mg via ORAL
  Filled 2023-07-04 (×6): qty 1

## 2023-07-04 MED ORDER — MELATONIN 5 MG PO TABS
5.0000 mg | ORAL_TABLET | Freq: Every day | ORAL | Status: DC
  Administered 2023-07-04 – 2023-07-07 (×4): 5 mg via ORAL
  Filled 2023-07-04 (×4): qty 1

## 2023-07-04 NOTE — Progress Notes (Signed)
  Progress Note   Patient: John [name] XBJ:[id number] DOB: 1955-12-01 DOA: 06/30/2023     4 DOS: the patient was seen and examined on 07/04/2023 at 9:15AM      Brief hospital course: 68 y.o. M with dCHF, cirrhosis, CKD IIIb baseline 1.3-1.9 who presented with swelling due to medication noncompliance.     Assessment and Plan: Decompensated cirrhosis Acute on chronic diastolic congestive heart failure Ascites Net -900 cc yesterday, renal function stable Echocardiogram shows preserved EF No evidence of hepatic encephalopathy.  Ascites better after paracentesis.  SBP ruled out. -Continue IV Lasix twice daily -Strict ins and outs, daily weights, daily BMP - Continue metoprolol -Hold spironolactone   CKD stage IIIb -Continue allopurinol  Persistent atrial flutter Not an anticoagulation candidate - Continue metoprolol  Hypertension Peripheral vascular disease Cerebrovascular disease - Continue metoprolol - Hold spironolactone - Continue atorvastatin  UTI -Continue antibiotics, narrowed to Keflex to complete 5 days  History of renal cell cancer History of cryoablation.  Outpatient follow-up with urology recommended.  COPD No oxygen at baseline, no evidence of flare  Anemia Thrombocytopenia Hemoglobin and platelets appear stable today  Class II obesity Morbid obesity, BMI 36.2 in the setting of comorbid hypertension, hyperlipidemia       Subjective: Patient had some nausea this afternoon.  Otherwise he is feeling okay, swelling is gradually better, his strength and appetite were better earlier.  Ascites is much better.     Physical Exam: BP 108/82 (BP Location: Left Arm)   Pulse 64   Temp 97.6 F (36.4 C) (Oral)   Resp 20   Ht 5\' 7"  (1.702 m)   Wt 105.1 kg   SpO2 99%   BMI 36.29 kg/m   Adult male, lying in bed, interactive and appropriate RRR, no murmurs, still has extensive pitting edema in the lower extremities Respiratory rate seems normal,  lung sounds diminished but no rales or wheezes appreciated Abdomen soft no tenderness palpation or guarding, no ascites or distention Attention normal, affect normal, judgment insight appear normal  Data Reviewed: Basic metabolic panel shows creatinine stable at 1.94, sodium and potassium normal CBC shows hemoglobin stable at 8.8, platelets unchanged at 103 Cell studies from paracentesis rule out SBP   Family Communication: None present    Disposition: Status is: Inpatient         Author: Alberteen Sam, MD 07/04/2023 5:38 PM  For on call review [email or web address].

## 2023-07-04 NOTE — TOC Progression Note (Signed)
 Transition of Care [facility]) - Progression Note   Patient Details  Name: John [name] MRN: [id number] Date of Birth: [birth year]  Transition of Care [facility]) CM/SW Contact  Ewing Schlein, LCSW Phone Number: 07/04/2023, 2:45 PM  Clinical Narrative: Patient initially received the following bed offers:  [facility] [address] [place], [place] [address] [phone] Overall rating? Much below average  [facility] [address] [place], [place] [address] [phone] Overall rating ?? Below average  [facility] [address] [place], [place] [address] [phone] Overall rating ? Much below average  CSW received call from [place] with [facility] and informed CSW that patient has been active with [facility] for several years. Patient provided CSW with [facility] card, which CSW copied and provided to registration, but patient's [facility] insurance started on 06/28/23.  Per Cyril Loosen, patient will still need to be approved under [facility]. [facility] approved patient for SNF. [place], [place], [facility], and [facility] are contracted with [facility] but all 4 facilities declined the referral.  Cyril Loosen requested that CSW reach out to the facilities that made offers to see if any would accept a single case agreement. Kia with [facility] and Bjorn Loser with [facility] informed CSW the facilities would not accept a single case agreement, and CSW did not receive a response from [place] with central intake with [facility]. CSW updated Cyril Loosen. CSW re-faxed the initial referral to [facility], [facility], [place], [address], and [facility]. Cyril Loosen to reach out to the approved SNFs to see if one can accept him. TOC to follow.  Expected Discharge Plan: Skilled Nursing Facility Barriers to Discharge: Continued Medical Work up, SNF Pending bed offer, English as a second language teacher  Expected Discharge Plan and Services In-house Referral:  Clinical Social Work Post Acute Care Choice: Skilled Nursing Facility Living arrangements for the past 2 months: Apartment           DME Arranged: N/A DME Agency: NA  Social Determinants of Health (SDOH) Interventions SDOH Screenings   Food Insecurity: Food Insecurity Present (07/01/2023)  Housing: Low Risk  (07/01/2023)  Transportation Needs: No Transportation Needs (07/01/2023)  Utilities: Not At Risk (07/01/2023)  Social Connections: Moderately Isolated (07/01/2023)  Tobacco Use: Medium Risk (06/30/2023)   Readmission Risk Interventions    07/02/2023    2:22 PM  Readmission Risk Prevention Plan  Transportation Screening Complete  Medication Review (RN Care Manager) Complete  HRI or Home Care Consult Complete  SW Recovery Care/Counseling Consult Complete  Palliative Care Screening Not Applicable  Skilled Nursing Facility Complete

## 2023-07-05 DIAGNOSIS — I4892 Unspecified atrial flutter: Secondary | ICD-10-CM | POA: Diagnosis not present

## 2023-07-05 DIAGNOSIS — D649 Anemia, unspecified: Secondary | ICD-10-CM | POA: Diagnosis not present

## 2023-07-05 DIAGNOSIS — K7031 Alcoholic cirrhosis of liver with ascites: Secondary | ICD-10-CM | POA: Diagnosis not present

## 2023-07-05 DIAGNOSIS — N1831 Chronic kidney disease, stage 3a: Secondary | ICD-10-CM

## 2023-07-05 DIAGNOSIS — I5033 Acute on chronic diastolic (congestive) heart failure: Secondary | ICD-10-CM | POA: Diagnosis not present

## 2023-07-05 LAB — COMPREHENSIVE METABOLIC PANEL
ALT: 7 U/L (ref 0–44)
AST: 15 U/L (ref 15–41)
Albumin: 3.2 g/dL — ABNORMAL LOW (ref 3.5–5.0)
Alkaline Phosphatase: 53 U/L (ref 38–126)
Anion gap: 12 (ref 5–15)
BUN: 32 mg/dL — ABNORMAL HIGH (ref 8–23)
CO2: 28 mmol/L (ref 22–32)
Calcium: 9 mg/dL (ref 8.9–10.3)
Chloride: 98 mmol/L (ref 98–111)
Creatinine, Ser: 1.76 mg/dL — ABNORMAL HIGH (ref 0.61–1.24)
GFR, Estimated: 42 mL/min — ABNORMAL LOW (ref >60)
Glucose, Bld: 96 mg/dL (ref 70–99)
Potassium: 4.4 mmol/L (ref 3.5–5.1)
Sodium: 138 mmol/L (ref 135–145)
Total Bilirubin: 0.9 mg/dL (ref 0.0–1.2)
Total Protein: 7 g/dL (ref 6.5–8.1)

## 2023-07-05 LAB — CBC
HCT: 29.4 % — ABNORMAL LOW (ref 39.0–52.0)
Hemoglobin: 9 g/dL — ABNORMAL LOW (ref 13.0–17.0)
MCH: 24.8 pg — ABNORMAL LOW (ref 26.0–34.0)
MCHC: 30.6 g/dL (ref 30.0–36.0)
MCV: 81 fL (ref 80.0–100.0)
Platelets: 106 10*3/uL — ABNORMAL LOW (ref 150–400)
RBC: 3.63 MIL/uL — ABNORMAL LOW (ref 4.22–5.81)
RDW: 18.5 % — ABNORMAL HIGH (ref 11.5–15.5)
WBC: 4.6 10*3/uL (ref 4.0–10.5)
nRBC: 0 % (ref 0.0–0.2)

## 2023-07-05 MED ORDER — SPIRONOLACTONE 25 MG PO TABS
25.0000 mg | ORAL_TABLET | Freq: Every day | ORAL | Status: DC
  Administered 2023-07-06 – 2023-07-08 (×3): 25 mg via ORAL
  Filled 2023-07-05 (×3): qty 1

## 2023-07-05 MED ORDER — FUROSEMIDE 40 MG PO TABS
40.0000 mg | ORAL_TABLET | Freq: Every day | ORAL | Status: DC
  Administered 2023-07-06: 40 mg via ORAL
  Filled 2023-07-05: qty 1

## 2023-07-05 NOTE — Progress Notes (Signed)
  Progress Note   Patient: John [name] ZOX:[id number] DOB: Mar 24, 1956 DOA: 06/30/2023     5 DOS: the patient was seen and examined on 07/05/2023 at 3:35PM      Brief hospital course: 68 y.o. M with dCHF, cirrhosis, CKD IIIb baseline 1.3-1.9 who presented with swelling due to medication noncompliance.     Assessment and Plan: Decompensated cirrhosis Acute on chronic diastolic congestive heart failure Ascites Net -1 L yesterday, renal function stable, edema looks resolved Echocardiogram shows preserved EF No evidence of hepatic encephalopathy.  Ascites better after paracentesis.  SBP ruled out. - Transition to oral Lasix tomorrow - Add spironolactone back -Continue metoprolol -Strict ins and outs, daily weights, daily BMP    CKD stage IIIb Creatinine stable relative to baseline - Continue allopurinol  Persistent atrial flutter Not an anticoagulation candidate - Continue metoprolol  Hypertension Peripheral vascular disease Cerebrovascular disease -Continue metoprolol, Lipitor - Resume spironolactone  UTI - Continue Keflex to complete 5 days  History of renal cell cancer History of cryoablation.  Outpatient follow-up with urology recommended.  COPD Off oxygen, no evidence of flare  Anemia Thrombocytopenia Blood levels appear stable  Class II obesity Morbid obesity, BMI 36.2 in the setting of comorbid hypertension, hyperlipidemia       Subjective: Appetite less today, but no vomiting, swelling is resolved     Physical Exam: BP 103/69 (BP Location: Right Arm)   Pulse 96   Temp 98.1 F (36.7 C) (Axillary)   Resp 14   Ht 5\' 7"  (1.702 m)   Wt 105.7 kg   SpO2 96%   BMI 36.50 kg/m   Adult male, lying in bed, interactive and appropriate RRR, no murmurs, edema is improved Respiratory rate normal, lung sounds normal, no rales or wheezes Abdomen soft, somewhat distended, but no significant ascites Adult attention normal, affect normal, judgment  insight appear normal, appears generally weak but symmetric    Data Reviewed: Basic metabolic panel shows creatinine slightly down to 1.7, sodium and potassium normal, LFTs normal CBC shows stable hemoglobin, platelets 106K   Family Communication: None present    Disposition: Status is: Inpatient         Author: Alberteen Sam, MD 07/05/2023 4:40 PM  For on call review [email or web address].

## 2023-07-05 NOTE — Progress Notes (Signed)
 Mobility Specialist - Progress Note   07/05/23 1057  Mobility  Activity Ambulated with assistance in hallway  Level of Assistance Standby assist, set-up cues, supervision of patient - no hands on  Assistive Device Front wheel walker  Distance Ambulated (ft) 160 ft  Activity Response Tolerated well  Mobility Referral Yes  Mobility visit 1 Mobility  Mobility Specialist Start Time (ACUTE ONLY) 1029  Mobility Specialist Stop Time (ACUTE ONLY) 1057  Mobility Specialist Time Calculation (min) (ACUTE ONLY) 28 min   Pt received in bed and agreeable to mobility. No complaints during session. Pt to bed after session with all needs met. Bed alarm on.   [facility]

## 2023-07-06 DIAGNOSIS — I5033 Acute on chronic diastolic (congestive) heart failure: Secondary | ICD-10-CM | POA: Diagnosis not present

## 2023-07-06 LAB — CBC
HCT: 28.5 % — ABNORMAL LOW (ref 39.0–52.0)
Hemoglobin: 8.8 g/dL — ABNORMAL LOW (ref 13.0–17.0)
MCH: 24.7 pg — ABNORMAL LOW (ref 26.0–34.0)
MCHC: 30.9 g/dL (ref 30.0–36.0)
MCV: 80.1 fL (ref 80.0–100.0)
Platelets: 105 10*3/uL — ABNORMAL LOW (ref 150–400)
RBC: 3.56 MIL/uL — ABNORMAL LOW (ref 4.22–5.81)
RDW: 18.2 % — ABNORMAL HIGH (ref 11.5–15.5)
WBC: 4.6 10*3/uL (ref 4.0–10.5)
nRBC: 0 % (ref 0.0–0.2)

## 2023-07-06 LAB — BASIC METABOLIC PANEL
Anion gap: 11 (ref 5–15)
BUN: 35 mg/dL — ABNORMAL HIGH (ref 8–23)
CO2: 30 mmol/L (ref 22–32)
Calcium: 9 mg/dL (ref 8.9–10.3)
Chloride: 95 mmol/L — ABNORMAL LOW (ref 98–111)
Creatinine, Ser: 1.93 mg/dL — ABNORMAL HIGH (ref 0.61–1.24)
GFR, Estimated: 38 mL/min — ABNORMAL LOW (ref >60)
Glucose, Bld: 97 mg/dL (ref 70–99)
Potassium: 4.3 mmol/L (ref 3.5–5.1)
Sodium: 136 mmol/L (ref 135–145)

## 2023-07-06 MED ORDER — FUROSEMIDE 10 MG/ML IJ SOLN
60.0000 mg | Freq: Two times a day (BID) | INTRAMUSCULAR | Status: DC
  Administered 2023-07-06 – 2023-07-08 (×4): 60 mg via INTRAVENOUS
  Filled 2023-07-06 (×4): qty 6

## 2023-07-06 MED ORDER — MAGNESIUM SULFATE 2 GM/50ML IV SOLN
2.0000 g | Freq: Once | INTRAVENOUS | Status: AC
  Administered 2023-07-06: 2 g via INTRAVENOUS
  Filled 2023-07-06: qty 50

## 2023-07-06 NOTE — Plan of Care (Signed)
  Problem: Education: Goal: Knowledge of General Education information will improve Description: Including pain rating scale, medication(s)/side effects and non-pharmacologic comfort measures Outcome: Progressing   Problem: Health Behavior/Discharge Planning: Goal: Ability to manage health-related needs will improve Outcome: Progressing   Problem: Clinical Measurements: Goal: Ability to maintain clinical measurements within normal limits will improve Outcome: Progressing Goal: Will remain free from infection Outcome: Progressing Goal: Diagnostic test results will improve Outcome: Progressing Goal: Respiratory complications will improve Outcome: Progressing Goal: Cardiovascular complication will be avoided Outcome: Progressing   Problem: Activity: Goal: Risk for activity intolerance will decrease Outcome: Progressing   Problem: Nutrition: Goal: Adequate nutrition will be maintained Outcome: Progressing   Problem: Coping: Goal: Level of anxiety will decrease Outcome: Progressing   Problem: Elimination: Goal: Will not experience complications related to bowel motility Outcome: Progressing Goal: Will not experience complications related to urinary retention Outcome: Progressing   Problem: Pain Managment: Goal: General experience of comfort will improve and/or be controlled Outcome: Progressing   Problem: Safety: Goal: Ability to remain free from injury will improve Outcome: Progressing   Problem: Skin Integrity: Goal: Risk for impaired skin integrity will decrease Outcome: Progressing   Problem: Education: Goal: Ability to demonstrate management of disease process will improve Outcome: Progressing Goal: Ability to verbalize understanding of medication therapies will improve Outcome: Progressing Goal: Individualized Educational Video(s) Outcome: Progressing   Problem: Activity: Goal: Capacity to carry out activities will improve Outcome: Progressing    Problem: Cardiac: Goal: Ability to achieve and maintain adequate cardiopulmonary perfusion will improve Outcome: Progressing   Problem: Education: Goal: Ability to demonstrate management of disease process will improve Outcome: Progressing Goal: Ability to verbalize understanding of medication therapies will improve Outcome: Progressing Goal: Individualized Educational Video(s) Outcome: Progressing   Problem: Activity: Goal: Capacity to carry out activities will improve Outcome: Progressing   Problem: Cardiac: Goal: Ability to achieve and maintain adequate cardiopulmonary perfusion will improve Outcome: Progressing

## 2023-07-06 NOTE — Progress Notes (Signed)
  Progress Note   Patient: John [name] ZOX:[id number] DOB: 12-26-1955 DOA: 06/30/2023     6 DOS: the patient was seen and examined on 07/06/2023        Brief hospital course: 68 y.o. M with dCHF, cirrhosis, CKD IIIb baseline 1.3-1.9 who presented with swelling due to medication noncompliance.     Assessment and Plan: Decompensated cirrhosis Acute on chronic diastolic congestive heart failure Ascites Net -1 L yesterday, renal function stable, edema looks resolved Echocardiogram shows preserved EF No evidence of hepatic encephalopathy.  Ascites better after paracentesis.  SBP ruled out. - Transition to oral Lasix tomorrow - Add spironolactone back -Continue metoprolol -Strict ins and outs, daily weights, daily BMP    CKD stage IIIb Creatinine stable relative to baseline - Continue allopurinol  Persistent atrial flutter Not an anticoagulation candidate - Continue metoprolol  Hypertension Peripheral vascular disease Cerebrovascular disease -Continue metoprolol, Lipitor - Resume spironolactone  UTI - Continue Keflex to complete 5 days  History of renal cell cancer History of cryoablation.  Outpatient follow-up with urology recommended.  COPD Off oxygen, no evidence of flare  Anemia Thrombocytopenia Blood levels appear stable  Class II obesity Morbid obesity, BMI 36.2 in the setting of comorbid hypertension, hyperlipidemia       Subjective: No change, no nursing concerns     Physical Exam: BP 97/67 (BP Location: Left Arm)   Pulse 95   Temp 97.7 F (36.5 C) (Oral)   Resp 20   Ht 5\' 7"  (1.702 m)   Wt 104.5 kg   SpO2 96%   BMI 36.08 kg/m   Adult male, lying in bed, interactive and appropriate RRR, no murmurs, edema is improved Respiratory rate normal, lung sounds normal, no rales or wheezes Abdomen soft, somewhat distended, but no significant ascites Adult attention normal, affect normal, judgment insight appear normal, appears generally  weak but symmetric    Data Reviewed:  BMP and CBC stable   Family Communication: None present    Disposition: Status is: Inpatient         Author: Alberteen Sam, MD 07/06/2023 5:29 PM  For on call review [email or web address]

## 2023-07-06 NOTE — Progress Notes (Signed)
 Mobility Specialist - Progress Note   07/06/23 0931  Mobility  Activity Ambulated with assistance in hallway  Level of Assistance Contact guard assist, steadying assist  Assistive Device Cane  Distance Ambulated (ft) 160 ft  Activity Response Tolerated well  Mobility Referral Yes  Mobility visit 1 Mobility  Mobility Specialist Start Time (ACUTE ONLY) 0908  Mobility Specialist Stop Time (ACUTE ONLY) 0931  Mobility Specialist Time Calculation (min) (ACUTE ONLY) 23 min   Pt received in bed and agreeable to mobility. Pt was minA from STS & CG during ambulation. Pt used hallway rails for support. No complaints during session. Pt to bed after session with all needs met. Bed alarm on.    [facility]

## 2023-07-07 ENCOUNTER — Encounter (HOSPITAL_COMMUNITY): Payer: Self-pay | Admitting: *Deleted

## 2023-07-07 DIAGNOSIS — K7031 Alcoholic cirrhosis of liver with ascites: Secondary | ICD-10-CM | POA: Diagnosis not present

## 2023-07-07 DIAGNOSIS — I5033 Acute on chronic diastolic (congestive) heart failure: Secondary | ICD-10-CM | POA: Diagnosis not present

## 2023-07-07 DIAGNOSIS — D649 Anemia, unspecified: Secondary | ICD-10-CM | POA: Diagnosis not present

## 2023-07-07 DIAGNOSIS — I4892 Unspecified atrial flutter: Secondary | ICD-10-CM | POA: Diagnosis not present

## 2023-07-07 LAB — CBC
HCT: 28.8 % — ABNORMAL LOW (ref 39.0–52.0)
Hemoglobin: 8.9 g/dL — ABNORMAL LOW (ref 13.0–17.0)
MCH: 24.6 pg — ABNORMAL LOW (ref 26.0–34.0)
MCHC: 30.9 g/dL (ref 30.0–36.0)
MCV: 79.6 fL — ABNORMAL LOW (ref 80.0–100.0)
Platelets: 111 10*3/uL — ABNORMAL LOW (ref 150–400)
RBC: 3.62 MIL/uL — ABNORMAL LOW (ref 4.22–5.81)
RDW: 18.2 % — ABNORMAL HIGH (ref 11.5–15.5)
WBC: 4.4 10*3/uL (ref 4.0–10.5)
nRBC: 0 % (ref 0.0–0.2)

## 2023-07-07 LAB — BASIC METABOLIC PANEL
Anion gap: 9 (ref 5–15)
BUN: 38 mg/dL — ABNORMAL HIGH (ref 8–23)
CO2: 30 mmol/L (ref 22–32)
Calcium: 8.7 mg/dL — ABNORMAL LOW (ref 8.9–10.3)
Chloride: 94 mmol/L — ABNORMAL LOW (ref 98–111)
Creatinine, Ser: 2.12 mg/dL — ABNORMAL HIGH (ref 0.61–1.24)
GFR, Estimated: 34 mL/min — ABNORMAL LOW (ref >60)
Glucose, Bld: 99 mg/dL (ref 70–99)
Potassium: 4.3 mmol/L (ref 3.5–5.1)
Sodium: 133 mmol/L — ABNORMAL LOW (ref 135–145)

## 2023-07-07 NOTE — Progress Notes (Signed)
  Progress Note   Patient: John [name] [id number] DOB: 07/15/1955 DOA: 06/30/2023     7 DOS: the patient was seen and examined on 07/07/2023        Brief hospital course: 68 y.o. M with dCHF, cirrhosis, CKD IIIb baseline 1.3-1.9 who presented with swelling due to medication noncompliance.     Assessment and Plan: Decompensated cirrhosis Acute on chronic diastolic congestive heart failure Ascites Net even yesterday, renal function relatively stable, edema looks resolved.   Echocardiogram shows preserved EF No evidence of hepatic encephalopathy.  Ascites better after paracentesis.  SBP ruled out. - Continue oral lasix and spironolactone  -Continue metoprolol   CKD stage IIIb Creatinine stable relative to baseline - Continue allopurinol  Persistent atrial flutter Not an anticoagulation candidate - Continue metoprolol  Hypertension Peripheral vascular disease Cerebrovascular disease -Continue metoprolol, Lipitor spironolactone  UTI Completed 5 days antibiotics  History of renal cell cancer History of cryoablation.  Outpatient follow-up with urology recommended.  COPD Off oxygen, no evidence of flare  Anemia Thrombocytopenia Blood levels appear stable  Class II obesity Morbid obesity, BMI 36.2 in the setting of comorbid hypertension, hyperlipidemia       Subjective: No change, no nursing concerns     Physical Exam: BP 117/83 (BP Location: Left Arm)   Pulse 80   Temp 97.7 F (36.5 C) (Oral)   Resp 20   Ht 5\' 7"  (1.702 m)   Wt 104.1 kg   SpO2 98%   BMI 35.94 kg/m   Adult male, lying in bed, interactive and appropriate RRR, no murmurs, edema is improved Respiratory rate normal, lung sounds normal, no rales or wheezes Abdomen soft, somewhat distended, but no significant ascites Adult attention normal, affect normal, judgment insight appear normal, appears generally weak but symmetric    Data Reviewed: CBC unchanged anemia and  thrombocytopenia Cr slightly up to 2.1, otherwise BMP unremarkable  Family Communication: None present    Disposition: Status is: Inpatient         Author: Alberteen Sam, MD 07/07/2023 4:43 PM  For on call review [email or web address]

## 2023-07-07 NOTE — TOC Progression Note (Signed)
 Transition of Care [facility]) - Progression Note   Patient Details  Name: John [name] MRN: [id number] Date of Birth: 04-Apr-1956  Transition of Care [facility]) CM/SW Contact  Ewing Schlein, LCSW Phone Number: 07/07/2023, 2:18 PM  Clinical Narrative: CSW spoke with Cyril Loosen with [facility] and confirmed [facility] the patient tomorrow for rehab. [facility] will provide transportation to the facility. CSW confirmed admission with Crystal at [facility]. CSW updated patient and hospitalist.  Expected Discharge Plan: Skilled Nursing Facility Barriers to Discharge: Other (must enter comment) [facility] will not have a bed until 07/08/23.)  Expected Discharge Plan and Services In-house Referral: Clinical Social Work Post Acute Care Choice: Skilled Nursing Facility Living arrangements for the past 2 months: Apartment           DME Arranged: N/A DME Agency: NA  Social Determinants of Health (SDOH) Interventions SDOH Screenings   Food Insecurity: Food Insecurity Present (07/01/2023)  Housing: Low Risk  (07/01/2023)  Transportation Needs: No Transportation Needs (07/01/2023)  Utilities: Not At Risk (07/01/2023)  Social Connections: Moderately Isolated (07/01/2023)  Tobacco Use: Medium Risk (06/30/2023)   Readmission Risk Interventions    07/02/2023    2:22 PM  Readmission Risk Prevention Plan  Transportation Screening Complete  Medication Review (RN Care Manager) Complete  HRI or Home Care Consult Complete  SW Recovery Care/Counseling Consult Complete  Palliative Care Screening Not Applicable  Skilled Nursing Facility Complete

## 2023-07-07 NOTE — Progress Notes (Signed)
 Physical Therapy Treatment Patient Details Name: John [name] MRN: [id number] DOB: 04/24/1956 Today's Date: 07/07/2023   History of Present Illness Patient is a 68 year old male who presented to ED on 3/3 with abdominal pain,distention with noncompliance with home meds. Patient was admitted with acute on chronic diastolic congestive heart failure, EtOH cirrhosis with ascites, UTI. Successful ultrasound guided paracentesis from the left lower quadrant, yielded 3.5 liters of hazy,amber fluid 3/4. PMH: CKD III, COPD, renal cell carcinoma, PAD, GERD, obesity.    PT Comments  Pt agreeable to PT, requires HHA assist to complete supine ti sit transfer at EOB with HOB raised. Pt has decreased cadence while amb, see gait section for details, but is able to complete mobility with SBA. He has decreased knee extension which places him in a position of decreased efficiency with movements, attempts to use SPC, but is unsafe with this device at this time. We discussed the use of 2WW for mobility at this time to allow for improved safety with tasks and inconsistent success with SPC. Seated leg exercises provided for desensitization, improved mm pumping and tissue remodeling, as well as strengthening, x15 reps BLE every hour. Based on pt PLOF, level of support, and current functional status, he would benefit from skilled PT <3 hours per day at dc.      If plan is discharge home, recommend the following: A little help with walking and/or transfers;A little help with bathing/dressing/bathroom;Assistance with cooking/housework;Assist for transportation;Help with stairs or ramp for entrance   Can travel by private vehicle     Yes  Equipment Recommendations  None recommended by PT    Recommendations for Other Services       Precautions / Restrictions Precautions Precautions: Fall Recall of Precautions/Restrictions: Intact Restrictions Weight Bearing Restrictions Per Provider Order: No     Mobility   Bed Mobility Overal bed mobility: Needs Assistance Bed Mobility: Supine to Sit     Supine to sit: HOB elevated, Contact guard     General bed mobility comments: pt able to complete most of sup to sit transfer with HOB elevated, requires HHA to assist with bringing trunk uprught the remainder of the way. Once upright he is able to postition himself EOB with good balance    Transfers Overall transfer level: Needs assistance Equipment used: Rolling walker (2 wheels) Transfers: Sit to/from Stand Sit to Stand: Supervision           General transfer comment: attempts once with cane and is able to come upright but unable to maintain balance, returns to sitting. Reattempted with 2WW, pt improves but walker is too high for him. Adjusted and completes 3rd time with good tolerance and support through BUE on AD. recommend he continue to use this at this time for safety.    Ambulation/Gait Ambulation/Gait assistance: Supervision Gait Distance (Feet): 80 Feet Assistive device: Rolling walker (2 wheels) Gait Pattern/deviations: Step-through pattern, Decreased stride length, Wide base of support Gait velocity: decreased     General Gait Details: Pt has small stride length, minimal time in swing phase with decreased clearance bilaterally   Stairs             Wheelchair Mobility     Tilt Bed    Modified Rankin (Stroke Patients Only)       Balance  Communication    Cognition Arousal: Alert Behavior During Therapy: WFL for tasks assessed/performed   PT - Cognitive impairments: No apparent impairments                                Cueing    Exercises General Exercises - Lower Extremity Quad Sets: AROM, Both, 15 reps Short Arc Quad: AROM, Both, 15 reps Long Arc Quad: AROM, Both, 15 reps, Seated Straight Leg Raises: AROM, Right, Limitations Straight Leg Raises Limitations: medial RLE knee pain  with elevation    General Comments        Pertinent Vitals/Pain Pain Assessment Pain Assessment: Faces Faces Pain Scale: Hurts little more Pain Location: R knee Pain Descriptors / Indicators: Sharp, Guarding Pain Intervention(s): Monitored during session, Limited activity within patient's tolerance    Home Living                          Prior Function            PT Goals (current goals can now be found in the care plan section) Acute Rehab PT Goals Patient Stated Goal: regain strength PT Goal Formulation: With patient Time For Goal Achievement: 07/17/23 Potential to Achieve Goals: Good Progress towards PT goals: Progressing toward goals    Frequency    Min 2X/week      PT Plan      Co-evaluation              AM-PAC PT "6 Clicks" Mobility   Outcome Measure  Help needed turning from your back to your side while in a flat bed without using bedrails?: A Little Help needed moving from lying on your back to sitting on the side of a flat bed without using bedrails?: A Little Help needed moving to and from a bed to a chair (including a wheelchair)?: A Little Help needed standing up from a chair using your arms (e.g., wheelchair or bedside chair)?: None Help needed to walk in hospital room?: A Little Help needed climbing 3-5 steps with a railing? : A Lot 6 Click Score: 18    End of Session Equipment Utilized During Treatment: Gait belt Activity Tolerance: Patient tolerated treatment well Patient left: with call bell/phone within reach;in chair Nurse Communication: Mobility status PT Visit Diagnosis: Unsteadiness on feet (R26.81);Muscle weakness (generalized) (M62.81);History of falling (Z91.81)     Time: 1430-1456 PT Time Calculation (min) (ACUTE ONLY): 26 min  Charges:    $Gait Training: 8-22 mins $Therapeutic Exercise: 8-22 mins PT General Charges $$ ACUTE PT VISIT: 1 Visit                     Madaline Guthrie, PT Acute Rehabilitation  Services Office: [phone] 07/07/2023    Evelena Peat 07/07/2023, 3:26 PM

## 2023-07-07 NOTE — Plan of Care (Signed)
  Problem: Coping: Goal: Level of anxiety will decrease Outcome: Progressing   Problem: Safety: Goal: Ability to remain free from injury will improve Outcome: Progressing   Problem: Education: Goal: Ability to demonstrate management of disease process will improve Outcome: Progressing

## 2023-07-08 DIAGNOSIS — I5033 Acute on chronic diastolic (congestive) heart failure: Secondary | ICD-10-CM | POA: Diagnosis not present

## 2023-07-08 MED ORDER — OXYCODONE HCL 5 MG PO TABS: 5.0000 mg | ORAL_TABLET | Freq: Every day | ORAL | 0 refills | Status: DC

## 2023-07-08 MED ORDER — ENSURE ENLIVE PO LIQD: 237.0000 mL | Freq: Two times a day (BID) | ORAL | Status: DC

## 2023-07-08 MED ORDER — POLYETHYLENE GLYCOL 3350 17 G PO PACK: 17.0000 g | PACK | Freq: Every day | ORAL | Status: DC

## 2023-07-08 NOTE — Plan of Care (Signed)
   Problem: Education: Goal: Knowledge of General Education information will improve Description: Including pain rating scale, medication(s)/side effects and non-pharmacologic comfort measures Outcome: Progressing   Problem: Elimination: Goal: Will not experience complications related to bowel motility Outcome: Progressing   Problem: Safety: Goal: Ability to remain free from injury will improve Outcome: Progressing

## 2023-07-08 NOTE — Progress Notes (Signed)
 Attempted to call report to [place] twice. Unsuccessful both times. Will try again shortly

## 2023-07-08 NOTE — TOC Transition Note (Signed)
 Transition of Care [facility]) - Discharge Note  Patient Details  Name: John [name] MRN: [id number] Date of Birth: 08/18/1955  Transition of Care [facility]) CM/SW Contact:  Ewing Schlein, LCSW Phone Number: 07/08/2023, 10:15 AM  Clinical Narrative: Patient will discharge to [facility] today and the number for report is [phone]. Discharge summary, discharge order, and SNF transfer report faxed to facility in hub. CSW coordinated transportation with [place] through [facility], which will transport the patient to [place]. Discharge packet completed. RN updated. TOC signing off.  Final next level of care: Skilled Nursing Facility Barriers to Discharge: Barriers Resolved  Patient Goals and CMS Choice Patient states their goals for this hospitalization and ongoing recovery are:: Go to rehab or home with [facility] CMS Medicare.gov Compare Post Acute Care list provided to:: Patient Choice offered to / list presented to : Patient  Discharge Placement Existing PASRR number confirmed : 07/03/23          Patient chooses bed at: [facility] Patient to be transferred to facility by: [facility] Patient and family notified of of transfer: 07/08/23  Discharge Plan and Services Additional resources added to the After Visit Summary for   In-house Referral: Clinical Social Work Post Acute Care Choice: Skilled Nursing Facility          DME Arranged: N/A DME Agency: NA  Social Drivers of Health (SDOH) Interventions SDOH Screenings   Food Insecurity: Food Insecurity Present (07/01/2023)  Housing: Low Risk  (07/01/2023)  Transportation Needs: No Transportation Needs (07/01/2023)  Utilities: Not At Risk (07/01/2023)  Social Connections: Moderately Isolated (07/01/2023)  Tobacco Use: Medium Risk (06/30/2023)   Readmission Risk Interventions    07/02/2023    2:22 PM  Readmission Risk Prevention Plan  Transportation Screening Complete  Medication Review (RN Care Manager) Complete  HRI or Home Care Consult  Complete  SW Recovery Care/Counseling Consult Complete  Palliative Care Screening Not Applicable  Skilled Nursing Facility Complete

## 2023-07-08 NOTE — Care Management Important Message (Signed)
 Important Message  Patient Details IM Letter given. Name: John [name] MRN: [id number] Date of Birth: [birth year]   Important Message Given:  Yes - Medicare IM     Caren Macadam 07/08/2023, 10:26 AM

## 2023-07-08 NOTE — Discharge Summary (Signed)
 Physician Discharge Summary   Patient: John [name] MRN: [id number] DOB: 05/16/1955  Admit date:     06/30/2023  Discharge date: 07/08/23  Discharge Physician: Alberteen Sam   PCP: Patient, No Pcp Per     Recommendations at discharge:  Follow up with [facility] one week after SNF discharge Follow up with Cardiology Dr. Elwyn Lade in 1 month for CHF Follow up with Urology with in 8-12 weeks Follow up with Gastroenterology [facility] in 1-2 months for cirrhosis [place]: Check BMP in 1 week (discharge Cr 2.1, K 4.3)     Discharge Diagnoses: Principal Problem:   Acute on chronic diastolic CHF (congestive heart failure) (HCC) Active Problems:   Decompensated cirrhosis   Alcoholic cirrhosis of liver with ascites (HCC)   Thrombocytopenia (HCC)   CKD stage 3b, GFR 45-59 ml/min (HCC)   Persistent Atrial flutter (HCC)   Chronic anemia   Hypertension   Peripheral Vascular disease   Cerebrovascular disease   Acute cystitis      Hospital Course: 68 y.o. M with dCHF, cirrhosis, CKD IIIb baseline 1.3-1.9 who presented with swelling due to medication noncompliance.     Decompensated cirrhosis Acute on chronic diastolic congestive heart failure Ascites Patient admitted and started on diuretics.    Underwent paracentesis that ruled out SBP, removed 3.5L fluid.  Transitioned to oral Lasix and spironolactone.       CKD stage IIIb Creatinine stable relative to baseline, 1.9-2.1   Persistent atrial flutter Not an anticoagulation candidate.  Rate controlled with metoprolol.    Hypertension Peripheral vascular disease Cerebrovascular disease On metoprolol, Lipitor spironolactone   UTI Completed 5 days antibiotics   History of renal cell cancer History of cryoablation.  Outpatient follow-up with urology recommended.   COPD Off oxygen, no evidence of flare   Anemia Thrombocytopenia Blood levels appear stable   Class II obesity Morbid obesity, BMI  36.2 in the setting of comorbid hypertension, hyperlipidemia              The [facility] Controlled Substances Registry was reviewed for this patient prior to discharge.   Consultants: None Procedures performed: Paracentesis   Disposition: Skilled nursing facility Diet recommendation:  Discharge Diet Orders (From admission, onward)     Start     Ordered   07/08/23 0000  Diet - low sodium heart healthy        07/08/23 0854             DISCHARGE MEDICATION: Allergies as of 07/08/2023       Reactions   Penicillins Other (See Comments)   Convulsions, Patient could not walk, childhood allergy  Has patient had a PCN reaction causing immediate rash, facial/tongue/throat swelling, SOB or lightheadedness with hypotension: No Has patient had a PCN reaction causing severe rash involving mucus membranes or skin necrosis: No Has patient had a PCN reaction that required hospitalization No Has patient had a PCN reaction occurring within the last 10 years: No If all of the above answers are "NO", then may proceed with Cephalosporin use.        Medication List     TAKE these medications    acetaminophen 325 MG tablet Commonly known as: TYLENOL Take 650 mg by mouth every 8 (eight) hours as needed for moderate pain (NOT TO EXCEED 2,000 MG/DAY).   allopurinol 100 MG tablet Commonly known as: ZYLOPRIM Take 1 tablet (100 mg total) by mouth daily.   atorvastatin 40 MG tablet Commonly known as: LIPITOR Take  40 mg by mouth daily.   brimonidine 0.15 % ophthalmic solution Commonly known as: ALPHAGAN Place 1 drop into both eyes at bedtime.   feeding supplement Liqd Take 237 mLs by mouth 2 (two) times daily between meals.   ferrous sulfate 325 (65 FE) MG EC tablet Take 325 mg by mouth every Monday, Wednesday, and Friday.   Flovent HFA 220 MCG/ACT inhaler Generic drug: fluticasone Inhale 2 puffs into the lungs in the morning and at bedtime.   folic acid 1 MG  tablet Commonly known as: FOLVITE Take 1 tablet (1 mg total) by mouth daily.   furosemide 40 MG tablet Commonly known as: LASIX Take 1.5 tablets (60 mg total) by mouth daily.   ketorolac 0.5 % ophthalmic solution Commonly known as: ACULAR Place 1 drop into the right eye 4 (four) times daily.   melatonin 5 MG Tabs Take 5 mg by mouth at bedtime.   metoprolol succinate 25 MG 24 hr tablet Commonly known as: Toprol XL Take 0.5 tablets (12.5 mg total) by mouth daily.   ofloxacin 0.3 % ophthalmic solution Commonly known as: OCUFLOX Place 1 drop into the right eye 4 (four) times daily.   omeprazole 40 MG capsule Commonly known as: PRILOSEC TAKE ONE CAPSULE BY MOUTH DAILY   oxyCODONE 5 MG immediate release tablet Commonly known as: Oxy IR/ROXICODONE Take 1 tablet (5 mg total) by mouth daily.   polyethylene glycol 17 g packet Commonly known as: MIRALAX / GLYCOLAX Take 17 g by mouth daily.   potassium chloride 10 MEQ tablet Commonly known as: KLOR-CON Take 10 mEq by mouth daily.   Salonpas Pain Relief Patch Ptch Apply 1 patch topically daily as needed (for pain- affected area).   spironolactone 50 MG tablet Commonly known as: ALDACTONE Take 1 tablet (50 mg total) by mouth daily.   thiamine 100 MG tablet Commonly known as: VITAMIN B1 Take 1 tablet (100 mg total) by mouth daily.        Contact information for follow-up providers     Romie Minus, MD. Schedule an appointment as soon as possible for a visit in 1 month(s).   Specialty: Cardiology Contact information: [address] [address] [phone]         Meryl Dare, MD. Schedule an appointment as soon as possible for a visit in 1 month(s).   Specialty: Gastroenterology Why: [facility] [phone]             Contact information for after-discharge care     Destination     [facility] .   Service: Skilled Nursing Contact information: [address] [place] [address] [phone]                     Discharge Instructions     Diet - low sodium heart healthy   Complete by: As directed    Increase activity slowly   Complete by: As directed        Discharge Exam: Filed Weights   07/06/23 0500 07/07/23 0500 07/08/23 0453  Weight: 104.5 kg 104.1 kg 105 kg    General: Pt is alert, awake, not in acute distress Cardiovascular: RRR, nl S1-S2, no murmurs appreciated.   Brawny change to LE, but only trace edema.    Respiratory: Normal respiratory rate and rhythm.  CTAB without rales or wheezes. Abdominal: Abdomen soft and non-tender. Somewhat distended but no significant ascites noted. Neuro/Psych: Strength symmetric in upper and lower extremities.  Judgment and insight appear impaired but at baseline.   Condition at discharge: fair  The results of significant diagnostics from this hospitalization (including imaging, microbiology, ancillary and laboratory) are listed below for reference.   Imaging Studies: US Paracentesis Result Date: 07/01/2023 INDICATION: Patient with history of congestive heart failure, alcoholic cirrhosis, renal cell carcinoma, hepatitis-C, chronic kidney disease, recurrent ascites. EXAM: ULTRASOUND GUIDED DIAGNOSTIC AND THERAPEUTIC PARACENTESIS MEDICATIONS: 8 mL 1% lidocaine COMPLICATIONS: None immediate. PROCEDURE: Informed written consent was obtained from the patient after a discussion of the risks, benefits and alternatives to treatment. A timeout was performed prior to the initiation of the procedure. Initial ultrasound scanning demonstrates a moderate amount of ascites within the left mid to lower abdominal quadrant. The left mid to lower abdomen was prepped and draped in the usual sterile fashion. 1% lidocaine was used for local anesthesia. Following this, a 19 gauge, 10-cm, Yueh catheter was introduced. An ultrasound image was saved for documentation purposes. The paracentesis  was performed. The catheter was removed and a dressing was applied. The patient tolerated the procedure well without immediate post procedural complication. FINDINGS: A total of approximately 3.5 liters of hazy, amber fluid was removed. Samples were sent to the laboratory as requested by the clinical team. IMPRESSION: Successful ultrasound-guided diagnostic and therapeutic paracentesis yielding 3.5 liters of peritoneal fluid. PLAN: The patient has required >/=2 paracenteses in a 30 day period and a formal evaluation by the [facility] has been arranged. Performed by: Jeananne Rama, PA-C Electronically Signed   By: Marliss Coots M.D.   On: 07/01/2023 16:31   DG Chest 2 View Result Date: 06/30/2023 CLINICAL DATA:  Shortness of breath and bilateral leg swelling EXAM: CHEST - 2 VIEW COMPARISON:  12/17/2023 FINDINGS: Stable cardiomegaly. Aortic atherosclerotic calcification. Vascular congestion and vague hazy airspace opacities bilaterally. Trace bilateral pleural effusions. No pneumothorax. Embolization coils left upper quadrant. IMPRESSION: Findings suggestive of CHF with pulmonary edema. Electronically Signed   By: Minerva Fester M.D.   On: 06/30/2023 21:24    Microbiology: Results for orders placed or performed during the hospital encounter of 06/30/23  Urine Culture (for pregnant, neutropenic or urologic patients or patients with an indwelling urinary catheter)     Status: Abnormal   Collection Time: 06/30/23 10:08 PM   Specimen: Urine, Clean Catch  Result Value Ref Range Status   Specimen Description   Final    URINE, CLEAN CATCH Performed at [facility], [address], [place], [place] [address]    Special Requests   Final    NONE Performed at [facility], [address] [place], [place] [address]    Culture >=100,000 COLONIES/mL ESCHERICHIA COLI (A)  Final   Report Status 07/03/2023 FINAL  Final    Organism ID, Bacteria ESCHERICHIA COLI (A)  Final      Susceptibility   Escherichia coli - MIC*    AMPICILLIN <=2 SENSITIVE Sensitive     CEFAZOLIN <=4 SENSITIVE Sensitive     CEFEPIME <=0.12 SENSITIVE Sensitive     CEFTRIAXONE <=0.25 SENSITIVE Sensitive     CIPROFLOXACIN <=0.25 SENSITIVE Sensitive     GENTAMICIN <=1 SENSITIVE Sensitive     IMIPENEM <=0.25 SENSITIVE Sensitive     NITROFURANTOIN <=16 SENSITIVE Sensitive     TRIMETH/SULFA <=20 SENSITIVE Sensitive     AMPICILLIN/SULBACTAM <=2 SENSITIVE Sensitive     PIP/TAZO <=4 SENSITIVE Sensitive ug/mL    * >=100,000 COLONIES/mL ESCHERICHIA COLI  Body fluid culture w Gram  Stain     Status: None   Collection Time: 07/01/23  2:29 PM   Specimen: PATH Cytology Peritoneal fluid  Result Value Ref Range Status   Specimen Description   Final    PERITONEAL Performed at [facility], [address], [place], [place] [address]    Special Requests   Final    NONE Performed at [facility], [address], [place], [place] [address]    Gram Stain NO WBC SEEN NO ORGANISMS SEEN   Final   Culture   Final    NO GROWTH 3 DAYS Performed at [facility], [address], [place], [place] [address]    Report Status 07/04/2023 FINAL  Final    Labs: CBC: Recent Labs  Lab 07/02/23 0400 07/04/23 0342 07/05/23 0429 07/06/23 0426 07/07/23 0338  WBC 5.1 4.8 4.6 4.6 4.4  HGB 9.1* 8.8* 9.0* 8.8* 8.9*  HCT 29.7* 28.4* 29.4* 28.5* 28.8*  MCV 81.6 81.4 81.0 80.1 79.6*  PLT 105* 103* 106* 105* 111*   Basic Metabolic Panel: Recent Labs  Lab 07/02/23 0400 07/03/23 0357 07/04/23 0342 07/05/23 0429 07/06/23 0426 07/07/23 0338  NA 137 137 138 138 136 133*  K 4.0 4.3 4.4 4.4 4.3 4.3  CL 101 100 99 98 95* 94*  CO2 27 25 26 28 30 30   GLUCOSE 96 87 94 96 97 99  BUN 21 26* 31* 32* 35* 38*  CREATININE 1.92* 1.90* 1.94* 1.76* 1.93* 2.12*  CALCIUM 8.7* 8.7* 8.6* 9.0 9.0 8.7*  MG 1.6*  --   --   --   --    --    Liver Function Tests: Recent Labs  Lab 07/05/23 0429  AST 15  ALT 7  ALKPHOS 53  BILITOT 0.9  PROT 7.0  ALBUMIN 3.2*   CBG: No results for input(s): "GLUCAP" in the last 168 hours.  Discharge time spent: approximately 45 minutes spent on discharge counseling, evaluation of patient on day of discharge, and coordination of discharge planning with nursing, social work, pharmacy and case management  Signed: Alberteen Sam, MD [facility] 07/08/2023

## 2023-07-09 ENCOUNTER — Encounter (HOSPITAL_COMMUNITY): Payer: Self-pay | Admitting: *Deleted

## 2023-07-10 ENCOUNTER — Ambulatory Visit: Payer: 59 | Admitting: Physician Assistant

## 2023-07-10 ENCOUNTER — Encounter (HOSPITAL_COMMUNITY): Payer: Self-pay | Admitting: *Deleted

## 2023-07-10 ENCOUNTER — Encounter: Payer: Self-pay | Admitting: Physician Assistant

## 2023-07-10 VITALS — BP 100/62 | HR 84 | Ht 67.0 in | Wt 213.0 lb

## 2023-07-10 DIAGNOSIS — K7031 Alcoholic cirrhosis of liver with ascites: Secondary | ICD-10-CM | POA: Diagnosis not present

## 2023-07-10 DIAGNOSIS — Z860101 Personal history of adenomatous and serrated colon polyps: Secondary | ICD-10-CM

## 2023-07-10 DIAGNOSIS — R188 Other ascites: Secondary | ICD-10-CM

## 2023-07-10 DIAGNOSIS — Z860100 Personal history of colon polyps, unspecified: Secondary | ICD-10-CM

## 2023-07-10 DIAGNOSIS — Z8719 Personal history of other diseases of the digestive system: Secondary | ICD-10-CM

## 2023-07-10 DIAGNOSIS — I8500 Esophageal varices without bleeding: Secondary | ICD-10-CM

## 2023-07-10 DIAGNOSIS — F1490 Cocaine use, unspecified, uncomplicated: Secondary | ICD-10-CM | POA: Diagnosis not present

## 2023-07-10 DIAGNOSIS — F1091 Alcohol use, unspecified, in remission: Secondary | ICD-10-CM | POA: Diagnosis not present

## 2023-07-10 DIAGNOSIS — K7290 Hepatic failure, unspecified without coma: Secondary | ICD-10-CM

## 2023-07-10 MED ORDER — SPIRONOLACTONE 50 MG PO TABS: 50.0000 mg | ORAL_TABLET | Freq: Every day | ORAL | 3 refills | Status: DC

## 2023-07-10 MED ORDER — FUROSEMIDE 40 MG PO TABS: 40.0000 mg | ORAL_TABLET | Freq: Every day | ORAL | 3 refills | Status: DC

## 2023-07-10 NOTE — Patient Instructions (Addendum)
 Your provider has requested that you go to the basement level for lab work before leaving today. Press "B" on the elevator. The lab is located at the first door on the left as you exit the elevator.   Due to recent changes in healthcare laws, you may see the results of your imaging and laboratory studies on MyChart before your provider has had a chance to review them.  We understand that in some cases there may be results that are confusing or concerning to you. Not all laboratory results come back in the same time frame and the provider may be waiting for multiple results in order to interpret others.  Please give [place] 48 hours in order for your provider to thoroughly review all the results before contacting the office for clarification of your results.    We have sent the following medications to your pharmacy for you to pick up at your convenience:  Lasix 40 mg Spironolactone 50mg   You have been scheduled for an abdominal paracentesis at [facility] radiology (1st floor of hospital) on Monday, 07/14/23 at 2:00 PM. Please arrive at least 30 minutes prior to your appointment time for registration. Should you need to reschedule this appointment for any reason, please call our office at [phone].  Follow up 2 months.  Thank you for trusting me with your gastrointestinal care!  Hyacinth Meeker, PA-C   _______________________________________________________  If your blood pressure at your visit was 140/90 or greater, please contact your primary care physician to follow up on this.  _______________________________________________________  If you are age 45 or older, your body mass index should be between 23-30. Your Body mass index is 33.36 kg/m. If this is out of the aforementioned range listed, please consider follow up with your Primary Care Provider.  If you are age 86 or younger, your body mass index should be between 19-25. Your Body mass index is 33.36 kg/m. If this is out of the  aformentioned range listed, please consider follow up with your Primary Care Provider.   ________________________________________________________  The [facility] providers would like to encourage you to use [facility] to communicate with providers for non-urgent requests or questions.  Due to long hold times on the telephone, sending your provider a message by [facility] may be a faster and more efficient way to get a response.  Please allow 48 business hours for a response.  Please remember that this is for non-urgent requests.  _______________________________________________________

## 2023-07-10 NOTE — Progress Notes (Signed)
 Chief Complaint: Follow up decompensated alcoholic Cirrhosis  HPI:    [name] is a 68 year old African-American male with a past medical history as listed below including CHF, CKD and decompensated alcoholic cirrhosis, previously known to Dr. Russella Dar (followed by Dr. Meridee Score in the hospital), who was referred to me by [facility] for follow-up of decompensated alcoholic cirrhosis.    05/09/2023 ultrasound-guided paracentesis with 5 point liters of hazy, amber fluid removed.    05/10/2023 echo for A-fib with an LVEF of 55-60% with mild left ventricular hypertrophy and aortic dilation.    05/12/2023 ultrasound-guided paracentesis with 5 L of fluid removed.    05/13/2023 patient seen in the hospital by her service with decompensated alcoholic cirrhosis and fluid retention.  Time of that admission patient discussed that he had quit drinking greater than 10 years ago but continued to have a cocaine habit.  He had been out of a diuretic for several months and the swelling had gotten worse to the point where it was painful in his legs and abdomen.  That admission MELD-NA was 20.  Was noted the patient was overdue for a colonoscopy and EGD.  Patient started back on Spironolactone 50 mg daily and Lasix 40 mg daily.    05/14/2023 patient wanted to be discharged.  Discussed that he would need an EGD and colonoscopy outpatient but in the hospital setting.    06/30/2023-07/08/2023 patient admitted to the hospital for acute on chronic diastolic CHF and decompensated cirrhosis with ascites.  He underwent paracentesis that ruled out SBP, 3.5 L removed.  Transition to oral Lasix and spironolactone.  Creatinine remained stable.    07/07/2023 BMP sodium 133, creatinine 2.12.  CBC with a hemoglobin 8.9 (around baseline).    Today, patient presents to clinic and again is not a great historian.  He tells me in fact that he again has not been on his diuretics even since recent discharge from the hospital a few  days ago.  Describes that his medication changed from bottles to prepackaged dosing and that confuses him.  He has not been taking it as he should.  Continues to use cocaine occasionally.  Describes that currently his abdomen is "not as tight as it was when he went to the hospital but is getting worse.  Otherwise feels good.  Aware that he needs to be on his diuretics or he will continue to need paracentesis.    Denies fever, chills, weight loss or blood in his stool.  GI history: 12/26/2021 office visit with Dr. Russella Dar for follow-up of decompensated cirrhosis: At that time had been in the ED for visit with lower extremity edema on 7/18, CT showed bilateral flank edema with perirectal edema and small bilateral pleural effusions; plan: At that time diagnosed decompensated cirrhosis secondary to hepatitis C and alcohol portal gastropathy, esophageal varices and bleeding gastric varices treated with BRTO and EGD, also CHF with volume overload, GERD with LA grade a esophagitis and an esophageal stricture and a personal history of tubular adenoma and serrated adenoma in November 2019 11/13/2021 CTAP with cirrhosis and portal hypertension manifesting as splenomegaly and distal esophageal varices, increased bilateral flank edema, stable small umbilical hernia containing small bowel and aortic atherosclerosis 06/27/2020 EGD with grade 1 esophageal varices, benign-appearing esophageal stenosis, LA grade a reflux esophagitis, portal hypertensive gastropathy, gastric varices without bleeding a small hiatal hernia-recommended repeat EGD in 1 to 2 years for surveillance 03/02/2018 colonoscopy with one 5 mm polyp in transverse colon, four  7-8 mm polyps in the sigmoid colon, descending colon and transverse colon, lipoma in the transverse colon internal hemorrhoids-pathology showed tubular adenoma and serrated adenoma-repeat recommended in 3 years  Past Medical History:  Diagnosis Date   Allergy    Arthritis    oa back    Asthma    Cataract    both eyes   CHF (congestive heart failure) (HCC) 2001   sees primary for   Chronic back pain    Chronic kidney disease    ckd 3   Cirrhosis (HCC)    alcoholic with h/o varices, ascites   Coronary artery disease    nonobstrucrtibe   ETOH abuse    GERD (gastroesophageal reflux disease)    Gout    Hepatitis    hepatitic c 2018 24 week tx with ecuplipsa, no hepatitic c detected after tx   History of blood transfusion 8-9- yrs ago   Hypertension    Optic neuropathy, left    PAD (peripheral artery disease) (HCC)    slight  primary manages   Polysubstance abuse (HCC)    Renal cell carcinoma (HCC) 2016, 2018 and 2022   left ablation   Seizures [facility])    age 24 none since   Sickle cell trait (HCC)    Stroke (HCC) 1998   "light no problems with"   Uses walker 12/04/2020   Wears dentures    Wears glasses     Past Surgical History:  Procedure Laterality Date   ARTHRODESIS METATARSALPHALANGEAL JOINT (MTPJ) Right 12/07/2020   Procedure: ARTHRODESIS METATARSALPHALANGEAL JOINT (MTPJ);  Surgeon: Felecia Shelling, DPM;  Location: [facility];  Service: Podiatry;  Laterality: Right;   COLONOSCOPY  2021   ESOPHAGOGASTRODUODENOSCOPY N/A 08/14/2014   Procedure: ESOPHAGOGASTRODUODENOSCOPY (EGD);  Surgeon: Meryl Dare, MD;  Location: [facility];  Service: Endoscopy;  Laterality: N/A;   ESOPHAGOGASTRODUODENOSCOPY (EGD) WITH PROPOFOL N/A 06/03/2017   Procedure: ESOPHAGOGASTRODUODENOSCOPY (EGD) WITH PROPOFOL;  Surgeon: Meryl Dare, MD;  Location: [facility];  Service: Endoscopy;  Laterality: N/A;   HALLUX FUSION Left 07/28/2020   Procedure: HALLUX FUSION MPJ;  Surgeon: Felecia Shelling, DPM;  Location: [facility];  Service: Podiatry;  Laterality: Left;   HAMMER TOE SURGERY Left 07/28/2020   Procedure: HAMMER TOE CORRECTION  2,3,AND4 LEFT FOOT;  Surgeon: Felecia Shelling, DPM;  Location: [facility];  Service: Podiatry;   Laterality: Left;   HAMMER TOE SURGERY Right 12/07/2020   Procedure: HAMMER TOE CORRECTION  2-4;  Surgeon: Felecia Shelling, DPM;  Location: [facility];  Service: Podiatry;  Laterality: Right;   IR RADIOLOGIST EVAL & MGMT  09/25/2016   IR RADIOLOGIST EVAL & MGMT  12/10/2016   IR RADIOLOGIST EVAL & MGMT  03/25/2018   IR RADIOLOGIST EVAL & MGMT  03/24/2019   IR RADIOLOGIST EVAL & MGMT  05/17/2020   IR RADIOLOGIST EVAL & MGMT  08/03/2020   IR RADIOLOGIST EVAL & MGMT  11/22/2020   IR RADIOLOGIST EVAL & MGMT  05/31/2021   METATARSAL HEAD EXCISION Left 07/28/2020   Procedure: METATARSAL HEAD EXCISION TOES 2,3,AND 4  LEFT FOOT;  Surgeon: Felecia Shelling, DPM;  Location: [facility];  Service: Podiatry;  Laterality: Left;   METATARSAL OSTEOTOMY Right 12/07/2020   Procedure: METATARSAL OSTEOTOMY TOES 2-4 RIGHT FOOT;  Surgeon: Felecia Shelling, DPM;  Location: [facility];  Service: Podiatry;  Laterality: Right;   none     RADIOFREQUENCY ABLATION Left  11/08/2016   Procedure: LEFT RENAL CRYOABLATION;  Surgeon: Berdine Dance, MD;  Location: [facility];  Service: Anesthesiology;  Laterality: Left;   RADIOLOGY WITH ANESTHESIA N/A 08/15/2014   Procedure: RADIOLOGY WITH ANESTHESIA;  Surgeon: Berdine Dance, MD;  Location: [facility];  Service: Radiology;  Laterality: N/A;   RADIOLOGY WITH ANESTHESIA Left 07/05/2020   Procedure: CT CRYOABLATION;  Surgeon: Berdine Dance, MD;  Location: [facility];  Service: Anesthesiology;  Laterality: Left;   UPPER GASTROINTESTINAL ENDOSCOPY      Current Outpatient Medications  Medication Sig Dispense Refill   acetaminophen (TYLENOL) 325 MG tablet Take 650 mg by mouth every 8 (eight) hours as needed for moderate pain (NOT TO EXCEED 2,000 MG/DAY).     allopurinol (ZYLOPRIM) 100 MG tablet Take 1 tablet (100 mg total) by mouth daily. 30 tablet 0   atorvastatin (LIPITOR) 40 MG tablet Take 40 mg by mouth daily.     brimonidine (ALPHAGAN) 0.15 %  ophthalmic solution Place 1 drop into both eyes at bedtime.     feeding supplement (ENSURE ENLIVE / ENSURE PLUS) LIQD Take 237 mLs by mouth 2 (two) times daily between meals.     ferrous sulfate 325 (65 FE) MG EC tablet Take 325 mg by mouth every Monday, Wednesday, and Friday.     FLOVENT HFA 220 MCG/ACT inhaler Inhale 2 puffs into the lungs in the morning and at bedtime.     folic acid (FOLVITE) 1 MG tablet Take 1 tablet (1 mg total) by mouth daily. 30 tablet 0   furosemide (LASIX) 40 MG tablet Take 1.5 tablets (60 mg total) by mouth daily. (Patient not taking: Reported on 07/01/2023) 135 tablet 3   ketorolac (ACULAR) 0.5 % ophthalmic solution Place 1 drop into the right eye 4 (four) times daily.     melatonin 5 MG TABS Take 5 mg by mouth at bedtime.     Menthol-Methyl Salicylate (SALONPAS PAIN RELIEF PATCH) PTCH Apply 1 patch topically daily as needed (for pain- affected area).     metoprolol succinate (TOPROL XL) 25 MG 24 hr tablet Take 0.5 tablets (12.5 mg total) by mouth daily. 45 tablet 3   ofloxacin (OCUFLOX) 0.3 % ophthalmic solution Place 1 drop into the right eye 4 (four) times daily.     omeprazole (PRILOSEC) 40 MG capsule TAKE ONE CAPSULE BY MOUTH DAILY 30 capsule 5   oxyCODONE (OXY IR/ROXICODONE) 5 MG immediate release tablet Take 1 tablet (5 mg total) by mouth daily. 12 tablet 0   polyethylene glycol (MIRALAX / GLYCOLAX) 17 g packet Take 17 g by mouth daily.     potassium chloride (KLOR-CON) 10 MEQ tablet Take 10 mEq by mouth daily.     spironolactone (ALDACTONE) 50 MG tablet Take 1 tablet (50 mg total) by mouth daily. 30 tablet 3   thiamine (VITAMIN B-1) 100 MG tablet Take 1 tablet (100 mg total) by mouth daily. 30 tablet 0   No current facility-administered medications for this visit.    Allergies as of 07/10/2023 - Review Complete 07/01/2023  Allergen Reaction Noted   Penicillins Other (See Comments) 12/16/2012    Family History  Problem Relation Age of Onset   Hypertension  Mother        Living   Kidney disease Mother    Diabetes Mother    Heart disease Mother    Hypertension Father        Deceased, 28   Ulcers Father    Stomach cancer Father    Hypertension Brother  Diabetes Brother    Kidney disease Brother    Hypertension Sister    Colon cancer Neg Hx    Esophageal cancer Neg Hx    Rectal cancer Neg Hx     Social History   Socioeconomic History   Marital status: Single    Spouse name: Not on file   Number of children: 0   Years of education: Not on file   Highest education level: Not on file  Occupational History   Occupation: retired  Tobacco Use   Smoking status: Former    Current packs/day: 0.00    Average packs/day: 0.3 packs/day for 20.0 years (6.0 ttl pk-yrs)    Types: Cigarettes    Start date: 04/30/1991    Quit date: 04/30/2011    Years since quitting: 12.2   Smokeless tobacco: Never   Tobacco comments:    2015  Vaping Use   Vaping status: Never Used  Substance and Sexual Activity   Alcohol use: No    Comment: stopped 11 years ago   Drug use: Yes    Types: Cocaine, Marijuana    Comment: cocaine last used 2024 and marijuan last used 2024   Sexual activity: Not Currently  Other Topics Concern   Not on file  Social History Narrative   Lives with niece in a 2 story home.     On disability since 2015 for low back pain.  Used to work Environmental manager.    Highest level of education: 11th grade   Social Drivers of Corporate investment banker Strain: Not on file  Food Insecurity: Food Insecurity Present (07/01/2023)   Hunger Vital Sign    Worried About Running Out of Food in the Last Year: Sometimes true    Ran Out of Food in the Last Year: Sometimes true  Transportation Needs: No Transportation Needs (07/01/2023)   PRAPARE - Administrator, Civil Service (Medical): No    Lack of Transportation (Non-Medical): No  Physical Activity: Not on file  Stress: Not on file  Social Connections: Moderately Isolated  (07/01/2023)   Social Connection and Isolation Panel [NHANES]    Frequency of Communication with Friends and Family: Twice a week    Frequency of Social Gatherings with Friends and Family: Once a week    Attends Religious Services: 1 to 4 times per year    Active Member of [employer] or Organizations: No    Attends Banker Meetings: Never    Marital Status: Never married  Intimate Partner Violence: Not At Risk (07/01/2023)   Humiliation, Afraid, Rape, and Kick questionnaire    Fear of Current or Ex-Partner: No    Emotionally Abused: No    Physically Abused: No    Sexually Abused: No    Review of Systems:    Constitutional: No weight loss, fever or chills Cardiovascular: No chest pain Respiratory: No SOB  Gastrointestinal: See HPI and otherwise negative   Physical Exam:  Vital signs: BP 100/62   Pulse 84   Ht 5\' 7"  (1.702 m)   Wt 213 lb (96.6 kg)   BMI 33.36 kg/m    Constitutional:   Pleasant AA male appears to be in NAD, Well developed, Well nourished, alert and cooperative Respiratory: Respirations even and unlabored. Lungs clear to auscultation bilaterally.   No wheezes, crackles, or rhonchi.  Cardiovascular: Normal S1, S2. No MRG. Regular rate and rhythm. No peripheral edema, cyanosis or pallor.  Gastrointestinal:  Tense, Moderate Distension, nontender. No rebound or  guarding. Normal bowel sounds. No appreciable masses or hepatomegaly. +ascitic wave Rectal:  Not performed.  Msk:  Symmetrical without gross deformities. Without edema, no deformity or joint abnormality. +ambulates with walker, very slow moving, hard to get up on exam table Neurologic:  Alert and  oriented x4;  grossly normal neurologically.  Psychiatric: Demonstrates good judgement and reason without abnormal affect or behaviors.  RELEVANT LABS AND IMAGING: CBC    Component Value Date/Time   WBC 4.4 07/07/2023 0338   RBC 3.62 (L) 07/07/2023 0338   HGB 8.9 (L) 07/07/2023 0338   HGB 9.4 (L) 05/23/2023  1018   HCT 28.8 (L) 07/07/2023 0338   HCT 32.5 (L) 05/23/2023 1018   PLT 111 (L) 07/07/2023 0338   PLT 124 (L) 05/23/2023 1018   MCV 79.6 (L) 07/07/2023 0338   MCV 83 05/23/2023 1018   MCH 24.6 (L) 07/07/2023 0338   MCHC 30.9 07/07/2023 0338   RDW 18.2 (H) 07/07/2023 0338   RDW 16.7 (H) 05/23/2023 1018   LYMPHSABS 0.5 (L) 08/05/2022 1305   MONOABS 0.7 08/05/2022 1305   EOSABS 0.1 08/05/2022 1305   BASOSABS 0.0 08/05/2022 1305    CMP     Component Value Date/Time   NA 133 (L) 07/07/2023 0338   NA 140 05/23/2023 1022   K 4.3 07/07/2023 0338   CL 94 (L) 07/07/2023 0338   CO2 30 07/07/2023 0338   GLUCOSE 99 07/07/2023 0338   BUN 38 (H) 07/07/2023 0338   BUN 24 05/23/2023 1022   CREATININE 2.12 (H) 07/07/2023 0338   CREATININE 1.79 (H) 12/09/2016 1446   CALCIUM 8.7 (L) 07/07/2023 0338   PROT 7.0 07/05/2023 0429   PROT 7.3 05/23/2023 1018   ALBUMIN 3.2 (L) 07/05/2023 0429   ALBUMIN 4.0 05/23/2023 1018   AST 15 07/05/2023 0429   ALT 7 07/05/2023 0429   ALKPHOS 53 07/05/2023 0429   BILITOT 0.9 07/05/2023 0429   BILITOT 0.8 05/23/2023 1018   GFRNONAA 34 (L) 07/07/2023 0338   GFRNONAA 40 (L) 12/09/2016 1446   GFRAA >60 05/06/2018 0601   GFRAA 47 (L) 12/09/2016 1446    Assessment: 1.  Decompensated alcoholic cirrhosis: With esophageal varices and ascites, last EGD in March 2022, patient due for repeat, has had multiple paracentesis due to not being on his diuretics recently with a couple of hospital admissions 2.  Continued cocaine use 3.  History of adenomatous polyps: On last colonoscopy in 2019 with repeat recommended in 3 years, patient is overdue  Plan: 1.  Discussed with patient that I will set him up for what he needs, but he needs to follow-up with [place] as directed or I will have to cancel his procedures and he may be discharged from our clinic. 2.  Scheduled patient for an ultrasound-guided paracentesis no greater than 5 L 3.  Refilled Lasix 40 mg daily and  spironolactone 50 mg daily (creatinine elevated at last check with some CKD).  Discussed that I need to recheck his CMP in 1 to 2 weeks since he restarts diuretics.  This order was placed. 4.  Also went ahead and scheduled the patient for an EGD and colonoscopy at the hospital with Dr. Meridee Score given that he saw him in the hospital recently.  This was scheduled 2-3 months out.  I will plan to have the patient return to clinic prior to that appointment so that I can make sure that he is doing okay. 5.  Scheduled patient for a follow-up with me in  1 month.  Hyacinth Meeker, PA-C [facility] 07/10/2023, 8:21 AM  Cc: [employer] [employer] [facility]*

## 2023-07-11 NOTE — Progress Notes (Signed)
 Attending Physician's Attestation   I have reviewed the chart.   I agree with the Advanced Practitioner's note, impression, and recommendations with any updates as below. Agree it is key that the patient have good follow-up and hopefully he is willing to move forward with that.  Management of his diuretics is very reasonable and hopefully he does not have creatinine bump with reinitiation.    Corliss Parish, MD [facility] Advanced Endoscopy Office # [phone]

## 2023-07-14 ENCOUNTER — Inpatient Hospital Stay (HOSPITAL_COMMUNITY): Admission: RE | Admit: 2023-07-14 | Payer: Medicare (Managed Care) | Source: Ambulatory Visit

## 2023-08-08 ENCOUNTER — Encounter (HOSPITAL_COMMUNITY): Payer: Self-pay | Admitting: *Deleted

## 2023-08-15 ENCOUNTER — Inpatient Hospital Stay (HOSPITAL_COMMUNITY): Admission: RE | Admit: 2023-08-15 | Payer: Medicare (Managed Care) | Source: Ambulatory Visit

## 2023-08-20 ENCOUNTER — Encounter (HOSPITAL_COMMUNITY): Payer: Self-pay

## 2023-08-20 ENCOUNTER — Observation Stay (HOSPITAL_COMMUNITY)

## 2023-08-20 ENCOUNTER — Other Ambulatory Visit: Payer: Self-pay

## 2023-08-20 ENCOUNTER — Observation Stay (HOSPITAL_COMMUNITY)
Admission: EM | Admit: 2023-08-20 | Discharge: 2023-08-21 | Disposition: A | Discharge status: Home / Self Care | Attending: Family Medicine | Admitting: Family Medicine

## 2023-08-20 ENCOUNTER — Emergency Department (HOSPITAL_COMMUNITY)

## 2023-08-20 DIAGNOSIS — I2510 Atherosclerotic heart disease of native coronary artery without angina pectoris: Secondary | ICD-10-CM | POA: Insufficient documentation

## 2023-08-20 DIAGNOSIS — Z8673 Personal history of transient ischemic attack (TIA), and cerebral infarction without residual deficits: Secondary | ICD-10-CM | POA: Diagnosis not present

## 2023-08-20 DIAGNOSIS — I4892 Unspecified atrial flutter: Secondary | ICD-10-CM | POA: Diagnosis not present

## 2023-08-20 DIAGNOSIS — Z79899 Other long term (current) drug therapy: Secondary | ICD-10-CM | POA: Diagnosis not present

## 2023-08-20 DIAGNOSIS — I130 Hypertensive heart and chronic kidney disease with heart failure and stage 1 through stage 4 chronic kidney disease, or unspecified chronic kidney disease: Secondary | ICD-10-CM | POA: Insufficient documentation

## 2023-08-20 DIAGNOSIS — R0602 Shortness of breath: Secondary | ICD-10-CM | POA: Diagnosis present

## 2023-08-20 DIAGNOSIS — K7290 Hepatic failure, unspecified without coma: Secondary | ICD-10-CM | POA: Diagnosis not present

## 2023-08-20 DIAGNOSIS — J449 Chronic obstructive pulmonary disease, unspecified: Secondary | ICD-10-CM | POA: Insufficient documentation

## 2023-08-20 DIAGNOSIS — Z87891 Personal history of nicotine dependence: Secondary | ICD-10-CM | POA: Insufficient documentation

## 2023-08-20 DIAGNOSIS — K7460 Unspecified cirrhosis of liver: Secondary | ICD-10-CM

## 2023-08-20 DIAGNOSIS — K7031 Alcoholic cirrhosis of liver with ascites: Secondary | ICD-10-CM | POA: Diagnosis not present

## 2023-08-20 DIAGNOSIS — N1832 Chronic kidney disease, stage 3b: Secondary | ICD-10-CM | POA: Insufficient documentation

## 2023-08-20 DIAGNOSIS — Z85528 Personal history of other malignant neoplasm of kidney: Secondary | ICD-10-CM | POA: Insufficient documentation

## 2023-08-20 DIAGNOSIS — D696 Thrombocytopenia, unspecified: Secondary | ICD-10-CM | POA: Diagnosis not present

## 2023-08-20 DIAGNOSIS — I5032 Chronic diastolic (congestive) heart failure: Secondary | ICD-10-CM | POA: Diagnosis not present

## 2023-08-20 DIAGNOSIS — R188 Other ascites: Secondary | ICD-10-CM

## 2023-08-20 DIAGNOSIS — N183 Chronic kidney disease, stage 3 unspecified: Secondary | ICD-10-CM | POA: Insufficient documentation

## 2023-08-20 DIAGNOSIS — K7011 Alcoholic hepatitis with ascites: Secondary | ICD-10-CM | POA: Insufficient documentation

## 2023-08-20 LAB — CBC WITH DIFFERENTIAL/PLATELET
Abs Immature Granulocytes: 0.02 10*3/uL (ref 0.00–0.07)
Basophils Absolute: 0 10*3/uL (ref 0.0–0.1)
Basophils Relative: 1 %
Eosinophils Absolute: 0.1 10*3/uL (ref 0.0–0.5)
Eosinophils Relative: 2 %
HCT: 26.1 % — ABNORMAL LOW (ref 39.0–52.0)
Hemoglobin: 8.3 g/dL — ABNORMAL LOW (ref 13.0–17.0)
Immature Granulocytes: 1 %
Lymphocytes Relative: 9 %
Lymphs Abs: 0.4 10*3/uL — ABNORMAL LOW (ref 0.7–4.0)
MCH: 25.7 pg — ABNORMAL LOW (ref 26.0–34.0)
MCHC: 31.8 g/dL (ref 30.0–36.0)
MCV: 80.8 fL (ref 80.0–100.0)
Monocytes Absolute: 0.6 10*3/uL (ref 0.1–1.0)
Monocytes Relative: 14 %
Neutro Abs: 3 10*3/uL (ref 1.7–7.7)
Neutrophils Relative %: 73 %
Platelets: 126 10*3/uL — ABNORMAL LOW (ref 150–400)
RBC: 3.23 MIL/uL — ABNORMAL LOW (ref 4.22–5.81)
RDW: 18.8 % — ABNORMAL HIGH (ref 11.5–15.5)
WBC: 4.1 10*3/uL (ref 4.0–10.5)
nRBC: 0 % (ref 0.0–0.2)

## 2023-08-20 LAB — BRAIN NATRIURETIC PEPTIDE: B Natriuretic Peptide: 172.1 pg/mL — ABNORMAL HIGH (ref 0.0–100.0)

## 2023-08-20 LAB — COMPREHENSIVE METABOLIC PANEL WITH GFR
ALT: 9 U/L (ref 0–44)
AST: 17 U/L (ref 15–41)
Albumin: 3.6 g/dL (ref 3.5–5.0)
Alkaline Phosphatase: 51 U/L (ref 38–126)
Anion gap: 10 (ref 5–15)
BUN: 25 mg/dL — ABNORMAL HIGH (ref 8–23)
CO2: 22 mmol/L (ref 22–32)
Calcium: 9 mg/dL (ref 8.9–10.3)
Chloride: 106 mmol/L (ref 98–111)
Creatinine, Ser: 1.65 mg/dL — ABNORMAL HIGH (ref 0.61–1.24)
GFR, Estimated: 46 mL/min — ABNORMAL LOW (ref >60)
Glucose, Bld: 85 mg/dL (ref 70–99)
Potassium: 3.9 mmol/L (ref 3.5–5.1)
Sodium: 138 mmol/L (ref 135–145)
Total Bilirubin: 1.9 mg/dL — ABNORMAL HIGH (ref 0.0–1.2)
Total Protein: 7.4 g/dL (ref 6.5–8.1)

## 2023-08-20 LAB — AMMONIA: Ammonia: 27 umol/L (ref 9–35)

## 2023-08-20 LAB — TROPONIN I (HIGH SENSITIVITY)
Troponin I (High Sensitivity): 14 ng/L (ref <18)
Troponin I (High Sensitivity): 13 ng/L (ref <18)

## 2023-08-20 LAB — PROTIME-INR
INR: 1.3 — ABNORMAL HIGH (ref 0.8–1.2)
Prothrombin Time: 16.8 s — ABNORMAL HIGH (ref 11.4–15.2)

## 2023-08-20 LAB — MAGNESIUM: Magnesium: 1.8 mg/dL (ref 1.7–2.4)

## 2023-08-20 LAB — HIV ANTIBODY (ROUTINE TESTING W REFLEX): HIV Screen 4th Generation wRfx: NONREACTIVE

## 2023-08-20 LAB — PHOSPHORUS: Phosphorus: 3.2 mg/dL (ref 2.5–4.6)

## 2023-08-20 MED ORDER — ALLOPURINOL 100 MG PO TABS
50.0000 mg | ORAL_TABLET | Freq: Every day | ORAL | Status: DC
  Administered 2023-08-20 – 2023-08-21 (×2): 50 mg via ORAL
  Filled 2023-08-20 (×2): qty 1

## 2023-08-20 MED ORDER — SPIRONOLACTONE 25 MG PO TABS
50.0000 mg | ORAL_TABLET | Freq: Every day | ORAL | Status: DC
  Administered 2023-08-21: 50 mg via ORAL
  Filled 2023-08-20: qty 2

## 2023-08-20 MED ORDER — ACETAMINOPHEN 325 MG PO TABS: 650.0000 mg | ORAL_TABLET | Freq: Four times a day (QID) | ORAL | Status: DC | PRN

## 2023-08-20 MED ORDER — BRIMONIDINE TARTRATE 0.15 % OP SOLN
1.0000 [drp] | Freq: Two times a day (BID) | OPHTHALMIC | Status: DC
  Administered 2023-08-20 – 2023-08-21 (×2): 1 [drp] via OPHTHALMIC
  Filled 2023-08-20: qty 5

## 2023-08-20 MED ORDER — LORAZEPAM 1 MG PO TABS: 0.0000 mg | ORAL_TABLET | Freq: Four times a day (QID) | ORAL | Status: DC

## 2023-08-20 MED ORDER — SODIUM CHLORIDE 0.9% FLUSH
3.0000 mL | Freq: Two times a day (BID) | INTRAVENOUS | Status: DC
  Administered 2023-08-20 – 2023-08-21 (×2): 3 mL via INTRAVENOUS

## 2023-08-20 MED ORDER — ADULT MULTIVITAMIN W/MINERALS CH
1.0000 | ORAL_TABLET | Freq: Every day | ORAL | Status: DC
  Administered 2023-08-20 – 2023-08-21 (×2): 1 via ORAL
  Filled 2023-08-20 (×2): qty 1

## 2023-08-20 MED ORDER — THIAMINE HCL 100 MG/ML IJ SOLN
100.0000 mg | Freq: Every day | INTRAMUSCULAR | Status: DC
  Filled 2023-08-20 (×2): qty 2

## 2023-08-20 MED ORDER — ATORVASTATIN CALCIUM 40 MG PO TABS
40.0000 mg | ORAL_TABLET | Freq: Every day | ORAL | Status: DC
  Administered 2023-08-20 – 2023-08-21 (×2): 40 mg via ORAL
  Filled 2023-08-20 (×2): qty 1

## 2023-08-20 MED ORDER — ACETAMINOPHEN 650 MG RE SUPP: 650.0000 mg | Freq: Four times a day (QID) | RECTAL | Status: DC | PRN

## 2023-08-20 MED ORDER — LORAZEPAM 1 MG PO TABS: 0.0000 mg | ORAL_TABLET | Freq: Two times a day (BID) | ORAL | Status: DC

## 2023-08-20 MED ORDER — FUROSEMIDE 10 MG/ML IJ SOLN
40.0000 mg | Freq: Once | INTRAMUSCULAR | Status: AC
  Administered 2023-08-20: 40 mg via INTRAVENOUS
  Filled 2023-08-20: qty 4

## 2023-08-20 MED ORDER — METOPROLOL SUCCINATE ER 25 MG PO TB24
12.5000 mg | ORAL_TABLET | Freq: Every day | ORAL | Status: DC
  Administered 2023-08-20 – 2023-08-21 (×2): 12.5 mg via ORAL
  Filled 2023-08-20 (×2): qty 1

## 2023-08-20 MED ORDER — THIAMINE MONONITRATE 100 MG PO TABS
100.0000 mg | ORAL_TABLET | Freq: Every day | ORAL | Status: DC
  Administered 2023-08-20 – 2023-08-21 (×2): 100 mg via ORAL
  Filled 2023-08-20 (×2): qty 1

## 2023-08-20 MED ORDER — FUROSEMIDE 40 MG PO TABS
40.0000 mg | ORAL_TABLET | Freq: Every day | ORAL | Status: DC
  Administered 2023-08-21: 40 mg via ORAL
  Filled 2023-08-20: qty 1

## 2023-08-20 MED ORDER — FLUTICASONE PROPIONATE HFA 220 MCG/ACT IN AERO: 2.0000 | INHALATION_SPRAY | Freq: Two times a day (BID) | RESPIRATORY_TRACT | Status: DC

## 2023-08-20 MED ORDER — HEPARIN SODIUM (PORCINE) 5000 UNIT/ML IJ SOLN
5000.0000 [IU] | Freq: Three times a day (TID) | INTRAMUSCULAR | Status: DC
  Administered 2023-08-20 – 2023-08-21 (×2): 5000 [IU] via SUBCUTANEOUS
  Filled 2023-08-20 (×2): qty 1

## 2023-08-20 MED ORDER — LORAZEPAM 1 MG PO TABS: 1.0000 mg | ORAL_TABLET | ORAL | Status: DC | PRN

## 2023-08-20 MED ORDER — BUDESONIDE 0.5 MG/2ML IN SUSP
0.5000 mg | Freq: Two times a day (BID) | RESPIRATORY_TRACT | Status: DC
  Administered 2023-08-21: 0.5 mg via RESPIRATORY_TRACT
  Filled 2023-08-20: qty 2

## 2023-08-20 MED ORDER — FOLIC ACID 1 MG PO TABS
1.0000 mg | ORAL_TABLET | Freq: Every day | ORAL | Status: DC
  Administered 2023-08-20 – 2023-08-21 (×2): 1 mg via ORAL
  Filled 2023-08-20 (×2): qty 1

## 2023-08-20 MED ORDER — LIDOCAINE HCL 1 % IJ SOLN
INTRAMUSCULAR | Status: AC
  Filled 2023-08-20: qty 20

## 2023-08-20 MED ORDER — FOLIC ACID 1 MG PO TABS: 1.0000 mg | ORAL_TABLET | Freq: Every day | ORAL | Status: DC

## 2023-08-20 NOTE — Hospital Course (Addendum)
 68 year old man PMH alcoholic cirrhosis, esophageal varices, ascites, cocaine use, presenting with shortness of breath, bilateral lower extremity edema, questionable compliance with diuretics.  Last seen by GI 3/13, again compliance with questions at that time.  Admitted for decompensated cirrhosis, dyspnea.  Consultants None  Procedures/Events 4/23 5 L paracentesis therapeutic only  Afebrile, vital signs stable Appears calm and comfortable Cardiovascular regular rate and rhythm no murmur, rub or gallop Respiratory clear to auscultation bilaterally.  No wheezes, rales or rhonchi, normal respiratory effort Abdomen soft Mild thigh edema noted No significant ankle edema noted  Creatinine 1.65, better than last value of 3/10 which was 2.12 Magnesium  and phosphorus within normal limits Modest hyperbilirubinemia 1.9 Troponins negative, BNP modestly elevated Hemoglobin stable at 8.3 Platelets stable at 126  Decompensated alcoholic cirrhosis with varices, ascites, thrombocytopenia Chronic diastolic CHF Complicated by poor compliance Status post large-volume paracentesis with symptomatic improvement Continue Lasix , spironolactone  Reevaluate in a.m.  CKD stage IIIb Baseline creatinine around 1.8-2 Stable currently  Essential hypertension Stable  COPD Stable  Atrial fibrillation/flutter Not a candidate for anticoagulation  PMH renal cell carcinoma status post cryoablation  Noncompliance  Reevaluate tomorrow, likely discharge home

## 2023-08-20 NOTE — ED Provider Notes (Signed)
 [place] [facility] AT [facility] Provider Note   CSN: [id number] Arrival date & time: 08/20/23  0944     History  Chief Complaint  Patient presents with   Shortness of Breath    John [name] is a 68 y.o. male.  HPI   68 year old male with past medical history of CHF chronic ascites along with other comorbidities presents emergency department with worsening shortness of breath.  Patient does not require supplemental oxygen  at home.  He was recently discharged after he had a paracentesis and treatment for CHF.  Will discharge not he supposed to be on Lasix  and spironolactone , question noncompliance in the past.  Patient does not believe that he is taking his medications currently.  He states has been doing well until last night and today when he was having worsening shortness of breath, specifically with laying flat.  Over the last week fluid has been feeling in his legs and his abdomen.  He denies any fever, vomiting/diarrhea.  Admits to a dry cough, denies any active chest pain.  Home Medications Prior to Admission medications   Medication Sig Start Date End Date Taking? Authorizing Provider  acetaminophen  (TYLENOL ) 325 MG tablet Take 650 mg by mouth every 8 (eight) hours as needed for moderate pain (NOT TO EXCEED 2,000 MG/DAY).    [provider]  allopurinol  (ZYLOPRIM ) 100 MG tablet Take 1 tablet (100 mg total) by mouth daily. 05/12/18   Armenta Landau, MD  atorvastatin  (LIPITOR) 40 MG tablet Take 40 mg by mouth daily. 04/29/19   [provider]  brimonidine  (ALPHAGAN ) 0.15 % ophthalmic solution Place 1 drop into both eyes at bedtime.    [provider]  feeding supplement (ENSURE ENLIVE / ENSURE PLUS) LIQD Take 237 mLs by mouth 2 (two) times daily between meals. 07/08/23   Ephriam Hashimoto, MD  ferrous sulfate  325 (65 FE) MG EC tablet Take 325 mg by mouth every Monday, Wednesday, and Friday.    [provider]   FLOVENT  HFA 220 MCG/ACT inhaler Inhale 2 puffs into the lungs in the morning and at bedtime. 01/27/19   [provider]  folic acid  (FOLVITE ) 1 MG tablet Take 1 tablet (1 mg total) by mouth daily. 05/15/23   Krishnan, Gokul, MD  furosemide  (LASIX ) 40 MG tablet Take 1 tablet (40 mg total) by mouth daily. 07/10/23 10/08/23  Graciella Lavender, PA  ketorolac  (ACULAR ) 0.5 % ophthalmic solution Place 1 drop into the right eye 4 (four) times daily. 03/20/23   [provider]  melatonin 5 MG TABS Take 5 mg by mouth at bedtime.    [provider]  Menthol -Methyl Salicylate (SALONPAS PAIN RELIEF PATCH) PTCH Apply 1 patch topically daily as needed (for pain- affected area).    [provider]  metoprolol  succinate (TOPROL  XL) 25 MG 24 hr tablet Take 0.5 tablets (12.5 mg total) by mouth daily. 05/23/23   Marlyse Single T, PA-C  ofloxacin (OCUFLOX) 0.3 % ophthalmic solution Place 1 drop into the right eye 4 (four) times daily. 03/20/23   [provider]  omeprazole  (PRILOSEC) 40 MG capsule TAKE ONE CAPSULE BY MOUTH DAILY 07/02/18   Asencion Blacksmith, MD  oxyCODONE  (OXY IR/ROXICODONE ) 5 MG immediate release tablet Take 1 tablet (5 mg total) by mouth daily. 07/08/23   Danford, Willis Harter, MD  polyethylene glycol (MIRALAX  / GLYCOLAX ) 17 g packet Take 17 g by mouth daily. 07/08/23   Danford, Willis Harter, MD  potassium chloride  (  KLOR-CON ) 10 MEQ tablet Take 10 mEq by mouth daily.    [provider]  spironolactone  (ALDACTONE ) 50 MG tablet Take 1 tablet (50 mg total) by mouth daily. 07/10/23   Graciella Lavender, PA  thiamine  (VITAMIN B-1) 100 MG tablet Take 1 tablet (100 mg total) by mouth daily. 05/15/23   Maylene Spear, MD      Allergies    Penicillins    Review of Systems   Review of Systems  Constitutional:  Negative for fever.  Respiratory:  Positive for shortness of breath.   Cardiovascular:  Positive for leg swelling. Negative for chest pain.   Gastrointestinal:  Positive for abdominal distention. Negative for abdominal pain, diarrhea and vomiting.  Skin:  Negative for rash.  Neurological:  Negative for headaches.    Physical Exam Updated Vital Signs BP 109/76 (BP Location: Left Arm)   Pulse 84   Temp (!) 97.4 F (36.3 C) (Oral)   Resp 17   SpO2 97%  Physical Exam Vitals and nursing note reviewed.  Constitutional:      General: He is not in acute distress.    Appearance: Normal appearance.  HENT:     Head: Normocephalic.     Mouth/Throat:     Mouth: Mucous membranes are moist.  Cardiovascular:     Rate and Rhythm: Normal rate.  Pulmonary:     Effort: Pulmonary effort is normal. No respiratory distress.     Breath sounds: Examination of the right-lower field reveals decreased breath sounds. Examination of the left-lower field reveals decreased breath sounds. Decreased breath sounds, wheezing and rales present.  Abdominal:     Comments: Distended abdomen, no peritonitis  Musculoskeletal:     Right lower leg: Edema present.     Left lower leg: Edema present.     Comments: 3+ edema  Skin:    General: Skin is warm.  Neurological:     Mental Status: He is alert and oriented to person, place, and time. Mental status is at baseline.  Psychiatric:        Mood and Affect: Mood normal.     ED Results / Procedures / Treatments   Labs (all labs ordered are listed, but only abnormal results are displayed) Labs Reviewed  CBC WITH DIFFERENTIAL/PLATELET - Abnormal; Notable for the following components:      Result Value   RBC 3.23 (*)    Hemoglobin 8.3 (*)    HCT 26.1 (*)    MCH 25.7 (*)    RDW 18.8 (*)    Platelets 126 (*)    Lymphs Abs 0.4 (*)    All other components within normal limits  COMPREHENSIVE METABOLIC PANEL WITH GFR  PROTIME-INR  BRAIN NATRIURETIC PEPTIDE  TROPONIN I (HIGH SENSITIVITY)    EKG EKG Interpretation Date/Time:  Wednesday August 20 2023 09:54:33 EDT Ventricular Rate:  90 PR  Interval:    QRS Duration:  87 QT Interval:  381 QTC Calculation: 467 R Axis:   61  Text Interpretation: Atrial flutter with predominant 3:1 AV block Nonspecific T abnormalities, diffuse leads SImilar to previous Confirmed by Florentino Hurdle [phone]) on 08/20/2023 9:56:13 AM  Radiology DG Chest Port 1 View Result Date: 08/20/2023 CLINICAL DATA:  Shortness of breath EXAM: PORTABLE CHEST 1 VIEW COMPARISON:  June 30, 2023 FINDINGS: Mild bilateral reticular interstitial prominence, subtle congestive changes without consolidations?. Small left pleural reaction or effusion with left lower lobe atelectasis Heart normal size IMPRESSION: Mild bilateral reticular interstitial prominence, subtle congestive changes without consolidations?  Small left pleural reaction or effusion with left lower lobe. Electronically Signed   By: Fredrich Jefferson M.D.   On: 08/20/2023 11:00    Procedures Procedures    Medications Ordered in ED Medications - No data to display  ED Course/ Medical Decision Making/ A&P                                 Medical Decision Making Amount and/or Complexity of Data Reviewed Labs: ordered. Radiology: ordered.  Risk Prescription drug management.   68 year old male presents emergency room with worsening shortness of breath, lower extremity/abdominal swelling.  Recent admission and discharge for CHF exacerbation, ascites which required paracentesis.  Concern for noncompliance with Lasix /spironolactone .  Denies any acute fever, chest pain.  Workup shows findings of mild CHF.  He has a stable anemia and CKD.  He is not hypoxic on room air but does become tachypneic, more comfortable on 2 L nasal cannula.  Most likely in large part 2/2 large amount of abdominal ascites and distention.  Patient has required therapeutic paracentesis in the past.  I have restarted patient's Lasix  and we will plan for paracentesis and admission.  Patients evaluation and results requires admission for  further treatment and care.  Spoke with hospitalist, reviewed patient's ED course and they accept admission.  Patient agrees with admission plan, offers no new complaints and is stable/unchanged at time of admit.        Final Clinical Impression(s) / ED Diagnoses Final diagnoses:  None    Rx / DC Orders ED Discharge Orders     None         Flonnie Humphrey, DO 08/20/23 1425

## 2023-08-20 NOTE — H&P (Signed)
 History and Physical    Patient: John [name] GEX:[id number] DOB: Jul 12, 1955 DOA: 08/20/2023 DOS: the patient was seen and examined on 08/20/2023 PCP: Patient, No Pcp Per  Patient coming from: Home  Chief Complaint:  Chief Complaint  Patient presents with   Shortness of Breath   HPI: John [name] is a 68 y.o. 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  Patient presented to the ED ED for shortness of breath that he felt was related to CHF exacerbation.  He has bilateral lower extremity edema and ascites.  He is unsure of his last dose of Lasix  and upon further questioning by the EDP patient admits to not taking his Lasix  or Aldactone  as prescribed.  Vital signs were stable, he was afebrile and nontachycardic.  BUN 25 and creatinine 1.65 which is baseline.  BNP 172 with normal troponin.  Chest x-ray without any obvious edema.  Patient was given 1 dose of IV Lasix  40 mg in the ED and after discussion with the EDP an order was placed for IR paracentesis for today.  Team has been consulted to evaluate the patient for admission.  On review of outpatient GI notes patient was last seen on 3/13.  Again it was determined he had been noncompliant with his medications and he was counseled regarding this with the potential outcome that he would be dismissed from the practice.  On that same date he underwent an IR paracentesis and had 3.5 L of fluid withdrawn.  Follow-up paracentesis was planned for/25/25.  Because of his history of adenomatous polyps as well as varices EGD and colonoscopy were scheduled to be done at [facility] by Dr. Brice Campi.  This was scheduled 2 to 3 months out from that visit. Denies ETOH- admits to [facility]   Review of Systems: As mentioned in the history of  present illness. All other systems reviewed and are negative. Past Medical History:  Diagnosis Date   Allergy    Arthritis    oa back   Asthma    Cataract    both eyes   CHF (congestive heart failure) (HCC) 2001   sees primary for   Chronic back pain    Chronic kidney disease    ckd 3   Cirrhosis (HCC)    alcoholic with h/o varices, ascites   Coronary artery disease    nonobstrucrtibe   ETOH abuse    GERD (gastroesophageal reflux disease)    Gout    Hepatitis    hepatitic c 2018 24 week tx with ecuplipsa, no hepatitic c detected after tx   History of blood transfusion 8-9- yrs ago   Hypertension    Optic neuropathy, left    PAD (peripheral artery disease) (HCC)    slight  primary manages   Polysubstance abuse (HCC)    Renal cell carcinoma (HCC) 2016, 2018 and 2022   left ablation   Seizures [facility])    age 62 none since   Sickle cell trait (HCC)    Stroke (HCC) 1998   "light no problems with"   Uses walker 12/04/2020   Wears dentures    Wears glasses    Past Surgical History:  Procedure Laterality Date   ARTHRODESIS METATARSALPHALANGEAL JOINT (MTPJ) Right 12/07/2020   Procedure: ARTHRODESIS METATARSALPHALANGEAL JOINT (MTPJ);  Surgeon: Dot Gazella, DPM;  Location: [facility];  Service:  Podiatry;  Laterality: Right;   COLONOSCOPY  2021   ESOPHAGOGASTRODUODENOSCOPY N/A 08/14/2014   Procedure: ESOPHAGOGASTRODUODENOSCOPY (EGD);  Surgeon: Asencion Blacksmith, MD;  Location: [facility];  Service: Endoscopy;  Laterality: N/A;   ESOPHAGOGASTRODUODENOSCOPY (EGD) WITH PROPOFOL  N/A 06/03/2017   Procedure: ESOPHAGOGASTRODUODENOSCOPY (EGD) WITH PROPOFOL ;  Surgeon: Asencion Blacksmith, MD;  Location: [facility];  Service: Endoscopy;  Laterality: N/A;   HALLUX FUSION Left 07/28/2020   Procedure: HALLUX FUSION MPJ;  Surgeon: Dot Gazella, DPM;  Location: [facility];  Service: Podiatry;  Laterality: Left;   HAMMER TOE SURGERY Left 07/28/2020   Procedure:  HAMMER TOE CORRECTION  2,3,AND4 LEFT FOOT;  Surgeon: Dot Gazella, DPM;  Location: [facility];  Service: Podiatry;  Laterality: Left;   HAMMER TOE SURGERY Right 12/07/2020   Procedure: HAMMER TOE CORRECTION  2-4;  Surgeon: Dot Gazella, DPM;  Location: [facility];  Service: Podiatry;  Laterality: Right;   IR RADIOLOGIST EVAL & MGMT  09/25/2016   IR RADIOLOGIST EVAL & MGMT  12/10/2016   IR RADIOLOGIST EVAL & MGMT  03/25/2018   IR RADIOLOGIST EVAL & MGMT  03/24/2019   IR RADIOLOGIST EVAL & MGMT  05/17/2020   IR RADIOLOGIST EVAL & MGMT  08/03/2020   IR RADIOLOGIST EVAL & MGMT  11/22/2020   IR RADIOLOGIST EVAL & MGMT  05/31/2021   METATARSAL HEAD EXCISION Left 07/28/2020   Procedure: METATARSAL HEAD EXCISION TOES 2,3,AND 4  LEFT FOOT;  Surgeon: Dot Gazella, DPM;  Location: [facility];  Service: Podiatry;  Laterality: Left;   METATARSAL OSTEOTOMY Right 12/07/2020   Procedure: METATARSAL OSTEOTOMY TOES 2-4 RIGHT FOOT;  Surgeon: Dot Gazella, DPM;  Location: [facility];  Service: Podiatry;  Laterality: Right;   none     RADIOFREQUENCY ABLATION Left 11/08/2016   Procedure: LEFT RENAL CRYOABLATION;  Surgeon: Lucinda Saber, MD;  Location: [facility];  Service: Anesthesiology;  Laterality: Left;   RADIOLOGY WITH ANESTHESIA N/A 08/15/2014   Procedure: RADIOLOGY WITH ANESTHESIA;  Surgeon: Lucinda Saber, MD;  Location: [facility];  Service: Radiology;  Laterality: N/A;   RADIOLOGY WITH ANESTHESIA Left 07/05/2020   Procedure: CT CRYOABLATION;  Surgeon: Lucinda Saber, MD;  Location: [facility];  Service: Anesthesiology;  Laterality: Left;   UPPER GASTROINTESTINAL ENDOSCOPY     Social History:  reports that he quit smoking about 12 years ago. His smoking use included cigarettes. He started smoking about 32 years ago. He has a 6 pack-year smoking history. He has never used smokeless tobacco. He reports current drug use. Drugs: Cocaine and Marijuana. He  reports that he does not drink alcohol.  Allergies  Allergen Reactions   Penicillins Other (See Comments)    Convulsions, Patient could not walk, childhood allergy   Has patient had a PCN reaction causing immediate rash, facial/tongue/throat swelling, SOB or lightheadedness with hypotension: No Has patient had a PCN reaction causing severe rash involving mucus membranes or skin necrosis: No Has patient had a PCN reaction that required hospitalization No Has patient had a PCN reaction occurring within the last 10 years: No If all of the above answers are "NO", then may proceed with Cephalosporin use.    Family History  Problem Relation Age of Onset   Hypertension Mother        Living   Kidney disease Mother    Diabetes Mother    Heart disease Mother    Hypertension Father  Deceased, 29   Ulcers Father    Stomach cancer Father    Hypertension Brother    Diabetes Brother    Kidney disease Brother    Hypertension Sister    Colon cancer Neg Hx    Esophageal cancer Neg Hx    Rectal cancer Neg Hx     Prior to Admission medications   Medication Sig Start Date End Date Taking? Authorizing Provider  acetaminophen  (TYLENOL ) 325 MG tablet Take 650 mg by mouth every 8 (eight) hours as needed for moderate pain (NOT TO EXCEED 2,000 MG/DAY).    [provider]  allopurinol  (ZYLOPRIM ) 100 MG tablet Take 1 tablet (100 mg total) by mouth daily. 05/12/18   Armenta Landau, MD  atorvastatin  (LIPITOR) 40 MG tablet Take 40 mg by mouth daily. 04/29/19   [provider]  brimonidine  (ALPHAGAN ) 0.15 % ophthalmic solution Place 1 drop into both eyes at bedtime.    [provider]  feeding supplement (ENSURE ENLIVE / ENSURE PLUS) LIQD Take 237 mLs by mouth 2 (two) times daily between meals. 07/08/23   Ephriam Hashimoto, MD  ferrous sulfate  325 (65 FE) MG EC tablet Take 325 mg by mouth every Monday, Wednesday, and Friday.    [provider]  FLOVENT  HFA  220 MCG/ACT inhaler Inhale 2 puffs into the lungs in the morning and at bedtime. 01/27/19   [provider]  folic acid  (FOLVITE ) 1 MG tablet Take 1 tablet (1 mg total) by mouth daily. 05/15/23   Krishnan, Gokul, MD  furosemide  (LASIX ) 40 MG tablet Take 1 tablet (40 mg total) by mouth daily. 07/10/23 10/08/23  Graciella Lavender, PA  ketorolac  (ACULAR ) 0.5 % ophthalmic solution Place 1 drop into the right eye 4 (four) times daily. 03/20/23   [provider]  melatonin 5 MG TABS Take 5 mg by mouth at bedtime.    [provider]  Menthol -Methyl Salicylate (SALONPAS PAIN RELIEF PATCH) PTCH Apply 1 patch topically daily as needed (for pain- affected area).    [provider]  metoprolol  succinate (TOPROL  XL) 25 MG 24 hr tablet Take 0.5 tablets (12.5 mg total) by mouth daily. 05/23/23   Marlyse Single T, PA-C  ofloxacin (OCUFLOX) 0.3 % ophthalmic solution Place 1 drop into the right eye 4 (four) times daily. 03/20/23   [provider]  omeprazole  (PRILOSEC) 40 MG capsule TAKE ONE CAPSULE BY MOUTH DAILY 07/02/18   Asencion Blacksmith, MD  oxyCODONE  (OXY IR/ROXICODONE ) 5 MG immediate release tablet Take 1 tablet (5 mg total) by mouth daily. 07/08/23   Danford, Willis Harter, MD  polyethylene glycol (MIRALAX  / GLYCOLAX ) 17 g packet Take 17 g by mouth daily. 07/08/23   Danford, Willis Harter, MD  potassium chloride  (KLOR-CON ) 10 MEQ tablet Take 10 mEq by mouth daily.    [provider]  spironolactone  (ALDACTONE ) 50 MG tablet Take 1 tablet (50 mg total) by mouth daily. 07/10/23   Graciella Lavender, PA  thiamine  (VITAMIN B-1) 100 MG tablet Take 1 tablet (100 mg total) by mouth daily. 05/15/23   Maylene Spear, MD    Physical Exam: Vitals:   08/20/23 1353 08/20/23 1525 08/20/23 1531 08/20/23 1539  BP:  96/67 91/73 (!) 117/92  Pulse:      Resp:      Temp: 98.1 F (36.7 C)     TempSrc: Oral     SpO2:       Constitutional: NAD, calm,  comfortable Respiratory: clear to auscultation bilaterally,  no wheezing, no crackles. Normal respiratory effort. No accessory muscle use.  Cardiovascular: Regular rate and rhythm, no murmurs / rubs / gallops. No extremity edema. 2+ pedal pulses. No carotid bruits.  Abdomen: no tenderness, no masses palpated. No hepatosplenomegaly. Bowel sounds positive.  Musculoskeletal: no clubbing / cyanosis. No joint deformity upper and lower extremities. Good ROM, no contractures. Normal muscle tone.  Skin: no rashes, lesions, ulcers. No induration Neurologic: CN 2-12 grossly intact. Sensation intact, DTR normal. Strength 5/5 x all 4 extremities.  Psychiatric: Normal judgment and insight. Alert and oriented x 3. Normal mood.    Data Reviewed:  Sodium 138, potassium 3.9, CO2 22, glucose 85, BUN 25, creatinine 1.65, LFTs are normal except for mildly elevated total bilirubin 1.9  BNP 172 with troponin 14  WBC 4100 with hemoglobin 8.3, normal MCV, platelets 126,000  PT 16.8 and INR 1.3  Imaging as above  Assessment and Plan: Massive ascites causing mechanical dyspnea Presenting with shortness of breath and dyspnea on exertion in context of ever-increasing ascites.  Complicated by noncompliance with diuretics Suspect this is primarily a mechanical issue due to the size of the abdomen girth and s/p IR paracentesis today-per pt 5L removed-if blood pressure drops may need 1-2 doses of IV albumin  I have resumed home dose of Lasix  and Aldactone  beginning tomorrow as long as BP stable post paracentesis No fever or abdominal pain concerning for SBP He also has severe lower extremity edema and may benefit from Ace wraps and elevation  Alcoholic cirrhosis with associated varices and ascites Ascites treatment as above Check ammonia level-LFTs are normal No apparent upper GI bleeding symptoms and plan is for EGD in the next 2 to 3 months per outpatient GI notes I did update [facility] gastroenterology that patient  was here but at the present time I feel that there are no indications for inpatient GI consultation MELD score = 17 Sts stopped ETOH 10 yrs ago  Chronic thrombocytopenia secondary to alcoholic cirrhosis Current platelets are stable in the 120,000 range therefore I have tentatively added subcutaneous heparin  for DVT prophylaxis along with SCDs  Chronic kidney disease stage IIIb Renal function stable and at baseline  Hypertension Current blood pressure well below hypertensive range and this is likely secondary to oncotic shifting from ascites.   Blood pressure may fall further post paracentesis which would limit our ability to use Lasix  and Aldactone .   Continue to follow  COPD Continue home Flovent  He reports chronic cough  History of atrial fibrillation/flutter Previously deemed not an appropriate candidate for anticoagulation Continue beta-blocker  History of renal cell carcinoma Prior cryoablation  Medical nonadherence Patient has been counseled in the outpatient setting that if he continues with nonadherent behavior he may be dismissed from the GI practice    Advance Care Planning:   Code Status: Full Code   VTE prophylaxis: Subcutaneous heparin  with SCDs.  If platelets drop we will need to discontinue the subcutaneous heparin   Consults: Brief telephonic consultation with gastroenterology  Family Communication: Patient only  Severity of Illness: The appropriate patient status for this patient is OBSERVATION. Observation status is judged to be reasonable and necessary in order to provide the required intensity of service to ensure the patient's safety. The patient's presenting symptoms, physical exam findings, and initial radiographic and laboratory data in the context of their medical condition is felt to place them at decreased risk for further clinical deterioration. Furthermore, it is anticipated that the patient will be medically stable for discharge from the  hospital  within 2 midnights of admission.   Author: Kathye Parkin, NP 08/20/2023 4:20 PM  For on call review [email or web address].

## 2023-08-20 NOTE — Plan of Care (Signed)
   Problem: Education: Goal: Knowledge of General Education information will improve Description Including pain rating scale, medication(s)/side effects and non-pharmacologic comfort measures Outcome: Progressing   Problem: Health Behavior/Discharge Planning: Goal: Ability to manage health-related needs will improve Outcome: Progressing

## 2023-08-20 NOTE — ED Notes (Signed)
 Provided pt John [name] sandwich and oj

## 2023-08-20 NOTE — Procedures (Signed)
 PROCEDURE SUMMARY:  Successful image-guided paracentesis from the right lower abdomen.  Yielded 5.0 liters of clear yellow fluid which was the requested maximum.  No immediate complications.  EBL < 1 mL Patient tolerated well.   Specimen was not sent for labs.  Please see imaging section of Epic for full dictation.  Marilu Shown PA-C 08/20/2023 3:39 PM

## 2023-08-20 NOTE — ED Notes (Signed)
 pt on room air, maintaining ox sats above 95%

## 2023-08-20 NOTE — ED Triage Notes (Signed)
 Pt BIBA for shortness of breath r/t CHF exacerbation.  Pt has bilateral lower extremity edema in extremities and ascites.  Pt unsure of last dose of lasix .    Pt is A&o x 4 in no obvious distress at this time

## 2023-08-21 ENCOUNTER — Other Ambulatory Visit (HOSPITAL_COMMUNITY): Payer: Self-pay

## 2023-08-21 ENCOUNTER — Encounter (HOSPITAL_COMMUNITY): Payer: Self-pay | Admitting: *Deleted

## 2023-08-21 DIAGNOSIS — I5032 Chronic diastolic (congestive) heart failure: Secondary | ICD-10-CM | POA: Diagnosis not present

## 2023-08-21 DIAGNOSIS — K7290 Hepatic failure, unspecified without coma: Secondary | ICD-10-CM | POA: Diagnosis not present

## 2023-08-21 DIAGNOSIS — N183 Chronic kidney disease, stage 3 unspecified: Secondary | ICD-10-CM | POA: Insufficient documentation

## 2023-08-21 DIAGNOSIS — D696 Thrombocytopenia, unspecified: Secondary | ICD-10-CM | POA: Diagnosis not present

## 2023-08-21 DIAGNOSIS — N1832 Chronic kidney disease, stage 3b: Secondary | ICD-10-CM | POA: Diagnosis not present

## 2023-08-21 LAB — CBC
HCT: 28.1 % — ABNORMAL LOW (ref 39.0–52.0)
Hemoglobin: 8.6 g/dL — ABNORMAL LOW (ref 13.0–17.0)
MCH: 24.9 pg — ABNORMAL LOW (ref 26.0–34.0)
MCHC: 30.6 g/dL (ref 30.0–36.0)
MCV: 81.4 fL (ref 80.0–100.0)
Platelets: 105 10*3/uL — ABNORMAL LOW (ref 150–400)
RBC: 3.45 MIL/uL — ABNORMAL LOW (ref 4.22–5.81)
RDW: 18.3 % — ABNORMAL HIGH (ref 11.5–15.5)
WBC: 4 10*3/uL (ref 4.0–10.5)
nRBC: 0 % (ref 0.0–0.2)

## 2023-08-21 LAB — COMPREHENSIVE METABOLIC PANEL WITH GFR
ALT: 7 U/L (ref 0–44)
AST: 15 U/L (ref 15–41)
Albumin: 3.1 g/dL — ABNORMAL LOW (ref 3.5–5.0)
Alkaline Phosphatase: 48 U/L (ref 38–126)
Anion gap: 8 (ref 5–15)
BUN: 26 mg/dL — ABNORMAL HIGH (ref 8–23)
CO2: 22 mmol/L (ref 22–32)
Calcium: 8.8 mg/dL — ABNORMAL LOW (ref 8.9–10.3)
Chloride: 108 mmol/L (ref 98–111)
Creatinine, Ser: 1.71 mg/dL — ABNORMAL HIGH (ref 0.61–1.24)
GFR, Estimated: 44 mL/min — ABNORMAL LOW (ref >60)
Glucose, Bld: 88 mg/dL (ref 70–99)
Potassium: 4 mmol/L (ref 3.5–5.1)
Sodium: 138 mmol/L (ref 135–145)
Total Bilirubin: 1.4 mg/dL — ABNORMAL HIGH (ref 0.0–1.2)
Total Protein: 6.8 g/dL (ref 6.5–8.1)

## 2023-08-21 MED ORDER — LACTULOSE 10 GM/15ML PO SOLN
20.0000 g | Freq: Every day | ORAL | 0 refills | Status: DC | PRN
Start: 2023-08-21 — End: 2023-09-06
  Filled 2023-08-21: qty 946, 31d supply, fill #0

## 2023-08-21 MED ORDER — FUROSEMIDE 40 MG PO TABS
40.0000 mg | ORAL_TABLET | Freq: Every day | ORAL | 0 refills | Status: DC
  Filled 2023-08-21: qty 90, 90d supply, fill #0

## 2023-08-21 MED ORDER — METOPROLOL SUCCINATE ER 25 MG PO TB24
12.5000 mg | ORAL_TABLET | Freq: Every day | ORAL | 0 refills | Status: DC
  Filled 2023-08-21: qty 45, 90d supply, fill #0

## 2023-08-21 MED ORDER — OMEPRAZOLE 40 MG PO CPDR
40.0000 mg | DELAYED_RELEASE_CAPSULE | Freq: Every day | ORAL | 0 refills | Status: DC
  Filled 2023-08-21: qty 90, 90d supply, fill #0

## 2023-08-21 MED ORDER — ALLOPURINOL 100 MG PO TABS
50.0000 mg | ORAL_TABLET | Freq: Every day | ORAL | 0 refills | Status: DC
  Filled 2023-08-21: qty 45, 90d supply, fill #0

## 2023-08-21 MED ORDER — ATORVASTATIN CALCIUM 40 MG PO TABS
40.0000 mg | ORAL_TABLET | Freq: Every day | ORAL | 0 refills | Status: DC
  Filled 2023-08-21: qty 90, 90d supply, fill #0

## 2023-08-21 MED ORDER — SPIRONOLACTONE 50 MG PO TABS
50.0000 mg | ORAL_TABLET | Freq: Every day | ORAL | 0 refills | Status: DC
  Filled 2023-08-21: qty 90, 90d supply, fill #0

## 2023-08-21 NOTE — Discharge Summary (Addendum)
 Physician Discharge Summary   Patient: John [name] MRN: [id number] DOB: Mar 15, 1956  Admit date:     08/20/2023  Discharge date: 08/21/23  Discharge Physician: Jerline Moon   PCP: Patient, No Pcp Per   Recommendations at discharge:   Referred for outpatient follow-up with primary gastroenterology team [facility]. Ongoing management of cirrhosis.  Discharge Diagnoses: Principal Problem:   Decompensated cirrhosis (HCC) Active Problems:   Thrombocytopenia (HCC)   Atrial flutter (HCC)   Chronic diastolic CHF (congestive heart failure) (HCC)   Chronic kidney disease (CKD), stage III (moderate) (HCC)  Resolved Problems:   * No resolved hospital problems. *  Hospital Course: 68 year old man PMH alcoholic cirrhosis, esophageal varices, ascites, cocaine use, presenting with shortness of breath, bilateral lower extremity edema, questionable compliance with diuretics.  Last seen by GI 3/13, again compliance with questions at that time.  Admitted for decompensated cirrhosis, dyspnea.  Underwent therapeutic paracentesis and diuresis with rapid clinical improvement.  Discharged home in good condition.  Consultants None  Procedures/Events 4/23 5 L paracentesis therapeutic only  Decompensated alcoholic cirrhosis with varices, ascites, thrombocytopenia Chronic diastolic CHF Complicated by poor compliance Status post large-volume paracentesis with symptomatic improvement Continue Lasix , spironolactone  on discharge.  New prescription sent.  CKD stage IIIb Baseline creatinine around 1.8-2 Stable currently  Essential hypertension Stable  COPD Stable  Atrial fibrillation/flutter Not a candidate for anticoagulation  PMH renal cell carcinoma status post cryoablation Follow-up as an outpatient  Noncompliance  Disposition: Home Diet recommendation:  Cardiac diet DISCHARGE MEDICATION: Allergies as of 08/21/2023       Reactions   Penicillins Other (See Comments)    Convulsions, Patient could not walk, childhood allergy         Medication List     TAKE these medications    allopurinol  100 MG tablet Commonly known as: ZYLOPRIM  Take 0.5 tablets (50 mg total) by mouth daily.   atorvastatin  40 MG tablet Commonly known as: LIPITOR Take 1 tablet (40 mg total) by mouth daily.   brimonidine  0.15 % ophthalmic solution Commonly known as: ALPHAGAN  Place 1 drop into both eyes 2 (two) times daily.   feeding supplement Liqd Take 237 mLs by mouth 2 (two) times daily between meals.   FeroSul 325 (65 Fe) MG tablet Generic drug: ferrous sulfate  Take 325 mg by mouth every Monday, Wednesday, and Friday.   Flovent  HFA 220 MCG/ACT inhaler Generic drug: fluticasone  Inhale 2 puffs into the lungs in the morning and at bedtime.   fluticasone  50 MCG/ACT nasal spray Commonly known as: FLONASE  Place 1 spray into both nostrils in the morning.   folic acid  1 MG tablet Commonly known as: FOLVITE  Take 1 tablet (1 mg total) by mouth daily.   furosemide  40 MG tablet Commonly known as: LASIX  Take 1 tablet (40 mg total) by mouth daily.   ketoconazole  2 % shampoo Commonly known as: NIZORAL  Apply 1 Application topically See admin instructions. Shampoo the scalp 2 times a week   lactulose  10 GM/15ML solution Commonly known as: CHRONULAC  Take 30 mLs (20 g total) by mouth daily as needed for mild constipation.   melatonin 5 MG Tabs Take 5 mg by mouth at bedtime.   metoprolol  succinate 25 MG 24 hr tablet Commonly known as: Toprol  XL Take 0.5 tablets (12.5 mg total) by mouth daily.   omeprazole  40 MG capsule Commonly known as: PRILOSEC Take 1 capsule (40 mg total) by mouth daily before breakfast.   oxyCODONE  5 MG immediate release tablet Commonly known as: Oxy  IR/ROXICODONE  Take 1 tablet (5 mg total) by mouth daily.   polyethylene glycol 17 g packet Commonly known as: MIRALAX  / GLYCOLAX  Take 17 g by mouth daily.   potassium chloride  10 MEQ  tablet Commonly known as: KLOR-CON  Take 10 mEq by mouth daily.   ProAir  HFA 108 (90 Base) MCG/ACT inhaler Generic drug: albuterol  Inhale 2 puffs into the lungs every 6 (six) hours as needed for wheezing or shortness of breath.   Salonpas Pain Relief Patch Ptch Apply 1 patch topically See admin instructions. Apply one patch to the mid-back & one to the lower back in the morning and remove approx 12 hours later   spironolactone  50 MG tablet Commonly known as: ALDACTONE  Take 1 tablet (50 mg total) by mouth daily.   terbinafine 1 % cream Commonly known as: LAMISIL Apply 1 Application topically See admin instructions. Apply to both feet 2 times a day as directed   thiamine  100 MG tablet Commonly known as: VITAMIN B1 Take 1 tablet (100 mg total) by mouth daily.   TYLENOL  500 MG tablet Generic drug: acetaminophen  Take 1,000 mg by mouth every 6 (six) hours as needed for mild pain (pain score 1-3) or headache.        Discharge Exam: There were no vitals filed for this visit. Physical Exam Vitals reviewed.  Constitutional:      General: He is not in acute distress.    Appearance: He is not ill-appearing or toxic-appearing.  Cardiovascular:     Rate and Rhythm: Normal rate and regular rhythm.     Heart sounds: No murmur heard. Pulmonary:     Effort: Pulmonary effort is normal. No respiratory distress.     Breath sounds: No wheezing, rhonchi or rales.  Abdominal:     Palpations: Abdomen is soft.  Neurological:     Mental Status: He is alert.  Psychiatric:        Mood and Affect: Mood normal.        Behavior: Behavior normal.   Creatinine stable at 1.71 Total bilirubin improved, 1.4 Hemoglobin stable 8.6 Platelets 105   Condition at discharge: good  The results of significant diagnostics from this hospitalization (including imaging, microbiology, ancillary and laboratory) are listed below for reference.   Imaging Studies: US  Paracentesis Result Date:  08/20/2023 INDICATION: Patient with history of CHF, ETOH cirrhosis, recurrent ascites who presented to the ED due to abdominal distention and dyspnea. Request to IR for therapeutic paracentesis with 5 L maximum. EXAM: ULTRASOUND GUIDED THERAPEUTIC PARACENTESIS MEDICATIONS: 7 mL 1% lidocaine  COMPLICATIONS: None immediate. PROCEDURE: Informed written consent was obtained from the patient after a discussion of the risks, benefits and alternatives to treatment. A timeout was performed prior to the initiation of the procedure. Initial ultrasound scanning demonstrates a large amount of ascites within the right lower abdominal quadrant. The right lower abdomen was prepped and draped in the usual sterile fashion. 1% lidocaine  was used for local anesthesia. Following this, a 19 gauge, 7-cm, Yueh catheter was introduced. An ultrasound image was saved for documentation purposes. The paracentesis was performed. The catheter was removed and a dressing was applied. The patient tolerated the procedure well without immediate post procedural complication. FINDINGS: A total of approximately 5.0 L of clear yellow fluid was removed. IMPRESSION: Successful ultrasound-guided paracentesis yielding 5.0 liters of peritoneal fluid. Performed by Nathan Bake, PA-C PLAN: The patient has previously been formally evaluated by the [facility] and is being actively followed for potential future intervention. Electronically  Signed   By: Myrlene Asper D.O.   On: 08/20/2023 17:02   DG Chest Port 1 View Result Date: 08/20/2023 CLINICAL DATA:  Shortness of breath EXAM: PORTABLE CHEST 1 VIEW COMPARISON:  June 30, 2023 FINDINGS: Mild bilateral reticular interstitial prominence, subtle congestive changes without consolidations?. Small left pleural reaction or effusion with left lower lobe atelectasis Heart normal size IMPRESSION: Mild bilateral reticular interstitial prominence, subtle congestive  changes without consolidations? Small left pleural reaction or effusion with left lower lobe. Electronically Signed   By: Fredrich Jefferson M.D.   On: 08/20/2023 11:00    Microbiology: Results for orders placed or performed during the hospital encounter of 06/30/23  Urine Culture (for pregnant, neutropenic or urologic patients or patients with an indwelling urinary catheter)     Status: Abnormal   Collection Time: 06/30/23 10:08 PM   Specimen: Urine, Clean Catch  Result Value Ref Range Status   Specimen Description   Final    URINE, CLEAN CATCH Performed at [facility], [address], [place], [place] [address]    Special Requests   Final    NONE Performed at [facility], [address], [place], [place] [address]    Culture >=100,000 COLONIES/mL ESCHERICHIA COLI (A)  Final   Report Status 07/03/2023 FINAL  Final   Organism ID, Bacteria ESCHERICHIA COLI (A)  Final      Susceptibility   Escherichia coli - MIC*    AMPICILLIN <=2 SENSITIVE Sensitive     CEFAZOLIN <=4 SENSITIVE Sensitive     CEFEPIME <=0.12 SENSITIVE Sensitive     CEFTRIAXONE  <=0.25 SENSITIVE Sensitive     CIPROFLOXACIN  <=0.25 SENSITIVE Sensitive     GENTAMICIN <=1 SENSITIVE Sensitive     IMIPENEM <=0.25 SENSITIVE Sensitive     NITROFURANTOIN <=16 SENSITIVE Sensitive     TRIMETH/SULFA <=20 SENSITIVE Sensitive     AMPICILLIN/SULBACTAM <=2 SENSITIVE Sensitive     PIP/TAZO <=4 SENSITIVE Sensitive ug/mL    * >=100,000 COLONIES/mL ESCHERICHIA COLI  Body fluid culture w Gram Stain     Status: None   Collection Time: 07/01/23  2:29 PM   Specimen: PATH Cytology Peritoneal fluid  Result Value Ref Range Status   Specimen Description   Final    PERITONEAL Performed at [facility], [address], [place], [place] [address]    Special Requests   Final    NONE Performed at [facility], [address] [place], [place] [address]    Gram Stain NO  WBC SEEN NO ORGANISMS SEEN   Final   Culture   Final    NO GROWTH 3 DAYS Performed at [facility], [address], [place], [place] [address]    Report Status 07/04/2023 FINAL  Final    Labs: CBC: Recent Labs  Lab 08/20/23 1103 08/21/23 0433  WBC 4.1 4.0  NEUTROABS 3.0  --   HGB 8.3* 8.6*  HCT 26.1* 28.1*  MCV 80.8 81.4  PLT 126* 105*   Basic Metabolic Panel: Recent Labs  Lab 08/20/23 1103 08/20/23 1645 08/21/23 0433  NA 138  --  138  K 3.9  --  4.0  CL 106  --  108  CO2 22  --  22  GLUCOSE 85  --  88  BUN 25*  --  26*  CREATININE 1.65*  --  1.71*  CALCIUM  9.0  --  8.8*  MG  --  1.8  --   PHOS  --  3.2  --    Liver Function Tests: Recent Labs  Lab 08/20/23 1103 08/21/23 0433  AST 17 15  ALT 9 7  ALKPHOS 51 48  BILITOT 1.9* 1.4*  PROT 7.4 6.8  ALBUMIN  3.6 3.1*   CBG: No results for input(s): "GLUCAP" in the last 168 hours.  Discharge time spent: less than 30 minutes.  Signed: Jerline Moon, MD [facility] 08/21/2023

## 2023-08-21 NOTE — Care Management Obs Status (Signed)
 MEDICARE OBSERVATION STATUS NOTIFICATION   Patient Details  Name: John [name] MRN: [id number] Date of Birth: May 31, 1955   Medicare Observation Status Notification Given:  Yes    Kathryn Parish, RN 08/21/2023, 11:35 AM

## 2023-08-21 NOTE — TOC Initial Note (Signed)
 Transition of Care [facility]) - Initial/Assessment Note    Patient Details  Name: John [name] MRN: [id number] Date of Birth: [birth year]  Transition of Care [facility]) CM/SW Contact:    Kathryn Parish, RN Phone Number: 08/21/2023, 11:42 AM  Clinical Narrative:                 Home. Patients denies alcohol use for 10 years. Transportation friend. TOC sending off  Expected Discharge Plan: Home/Self Care Barriers to Discharge: Barriers Resolved   Patient Goals and CMS Choice Patient states their goals for this hospitalization and ongoing recovery are:: home          Expected Discharge Plan and Services         Expected Discharge Date: 08/21/23               DME Arranged: N/A DME Agency: NA         HH Agency: NA        Prior Living Arrangements/Services     Patient language and need for interpreter reviewed:: Yes Do you feel safe going back to the place where you live?: Yes      Need for Family Participation in Patient Care: No (Comment) Care giver support system in place?: Yes (comment)   Criminal Activity/Legal Involvement Pertinent to Current Situation/Hospitalization: No - Comment as needed  Activities of Daily Living   ADL Screening (condition at time of admission) Independently performs ADLs?: No Does the patient have a NEW difficulty with bathing/dressing/toileting/self-feeding that is expected to last >3 days?: Yes (Initiates electronic notice to provider for possible OT consult) Does the patient have a NEW difficulty with getting in/out of bed, walking, or climbing stairs that is expected to last >3 days?: Yes (Initiates electronic notice to provider for possible PT consult) Does the patient have a NEW difficulty with communication that is expected to last >3 days?: No Is the patient deaf or have difficulty hearing?: No Does the patient have difficulty seeing, even when wearing glasses/contacts?: No Does the patient have difficulty concentrating,  remembering, or making decisions?: No  Permission Sought/Granted   Permission granted to share information with : No              Emotional Assessment Appearance:: Appears older than stated age Attitude/Demeanor/Rapport: Engaged Affect (typically observed): Appropriate Orientation: : Oriented to Self, Oriented to Place, Oriented to  Time, Oriented to Situation Alcohol / Substance Use: Alcohol Use (Denies X 10 years) Psych Involvement: No (comment)  Admission diagnosis:  Shortness of breath [R06.02] Other ascites [R18.8] 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 [K70.11] Patient Active Problem List   Diagnosis Date Noted   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 08/20/2023   Chronic anemia 07/01/2023   Acute exacerbation of CHF (congestive heart failure) (HCC) 06/30/2023   History of hepatitis C 05/14/2023   Portal hypertension (HCC) 05/14/2023   Decompensated cirrhosis (HCC) 05/14/2023   Atrial flutter (HCC) 05/12/2023   Hypomagnesemia 05/08/2023   New onset atrial fibrillation (HCC) 05/07/2023   Stasis dermatitis of both legs 08/05/2022   Glaucoma (increased eye pressure) 08/05/2022   COPD with acute exacerbation (HCC) 08/05/2022   Acute exacerbation of congestive heart failure (HCC) 07/31/2022   Renal mass, left 07/05/2020   Umbilical hernia without obstruction and without gangrene 04/27/2019   Severe obesity (BMI 35.0-39.9) with comorbidity (HCC) 06/25/2018   Acute gouty arthritis 05/06/2018   Polyarthralgia 05/03/2018   History of renal cell carcinoma 01/12/2018   Esophageal varices without bleeding (HCC)  Renal cell carcinoma of left kidney (HCC) 11/08/2016   Malnutrition of moderate degree 07/20/2015   (HFpEF) heart failure with preserved ejection fraction (HCC) 07/19/2015   Abdominal pain 07/19/2015   Renal failure (ARF), acute on chronic (HCC) 07/19/2015   Acute-on-chronic kidney injury (HCC)    Hyponatremia 07/07/2015   Dehydration 07/07/2015   Acute respiratory  failure (HCC) 07/07/2015   CKD stage 3a, GFR 45-59 ml/min (HCC) 05/25/2015   CAP (community acquired pneumonia) 05/23/2015   Acute respiratory failure with hypoxia (HCC) 05/23/2015   Fluid overload 10/31/2014   Hepatitis C 10/31/2014   Ascites of liver 10/31/2014   Hepatitis, viral    Edema of abdominal wall    Pedal edema    Alcoholism (HCC)    Abdominal edema on examination 09/01/2014   Chronic liver failure (HCC)    Alcoholic cirrhosis of liver with ascites (HCC)    Chronic hepatitis C with cirrhosis (HCC)    Cardiac hypertrophy    Fever of unknown origin    Diastolic dysfunction    Altered mental status    Metabolic encephalopathy    Pulmonary edema    Acute encephalopathy    Ascites due to alcoholic cirrhosis (HCC)    Hypernatremia    Other specified fever    Varices, gastric    Acute on chronic renal failure (HCC)    Elevated transaminase level    Essential hypertension    Gastric varices    Upper GI bleed 08/14/2014   Acute blood loss anemia 08/14/2014   Alcohol abuse 08/14/2014   Coagulopathy (HCC) 08/14/2014   AKI (acute kidney injury) (HCC) 08/14/2014   GI bleed 08/14/2014   GIB (gastrointestinal bleeding) 08/14/2014   Bleeding gastric varices 08/14/2014   Acute upper GI bleed    Cirrhosis (HCC) 07/27/2014   Abdominal pain, generalized 07/27/2014   Ascites 07/26/2014   Optic neuritis 07/11/2014   Abnormal LFTs    Vision loss of left eye 06/24/2014   Accelerated hypertension 06/24/2014   Elevated LFTs 06/24/2014   Polysubstance abuse (HCC) 06/24/2014   Stage 3b chronic kidney disease (HCC) 06/24/2014   SOB (shortness of breath) 06/24/2014   Vision loss, left eye 06/24/2014   Thrombocytopenia (HCC) 06/24/2014   Hypokalemia 06/24/2014   Elevated troponin 06/24/2014   Essential hypertension, benign 12/29/2012   Neck pain 12/29/2012   Low back pain 12/29/2012   PCP:  Patient, No Pcp Per Pharmacy:   Melodee Spruce [facility] - [facility] [address] [place] [address] Phone: [phone] Fax: [phone]     Social Drivers of Health (SDOH) Social History: SDOH Screenings   Food Insecurity: No Food Insecurity (08/20/2023)  Recent Concern: Food Insecurity - Food Insecurity Present (07/01/2023)  Housing: Low Risk  (08/20/2023)  Transportation Needs: No Transportation Needs (08/20/2023)  Utilities: Not At Risk (08/20/2023)  Social Connections: Moderately Integrated (08/20/2023)  Recent Concern: Social Connections - Moderately Isolated (07/01/2023)  Tobacco Use: Medium Risk (08/20/2023)   SDOH Interventions: Food Insecurity Interventions: Intervention Not Indicated Housing Interventions: Intervention Not Indicated Transportation Interventions: Intervention Not Indicated Utilities Interventions: Intervention Not Indicated Social Connections Interventions: Intervention Not Indicated   Readmission Risk Interventions    07/02/2023    2:22 PM  Readmission Risk Prevention Plan  Transportation Screening Complete  Medication Review Ocean[employer]) Complete  HRI or Home Care Consult Complete  SW Recovery Care/Counseling Consult Complete  Palliative Care Screening Not Applicable  Skilled Nursing Facility Complete

## 2023-08-22 ENCOUNTER — Ambulatory Visit (HOSPITAL_COMMUNITY): Admission: RE | Admit: 2023-08-22 | Source: Ambulatory Visit

## 2023-09-04 ENCOUNTER — Other Ambulatory Visit: Payer: Self-pay

## 2023-09-04 ENCOUNTER — Observation Stay (HOSPITAL_COMMUNITY)

## 2023-09-04 ENCOUNTER — Ambulatory Visit: Payer: Medicare (Managed Care) | Admitting: Physician Assistant

## 2023-09-04 ENCOUNTER — Encounter (HOSPITAL_BASED_OUTPATIENT_CLINIC_OR_DEPARTMENT_OTHER): Payer: Self-pay | Admitting: Cardiovascular Disease

## 2023-09-04 ENCOUNTER — Emergency Department (HOSPITAL_BASED_OUTPATIENT_CLINIC_OR_DEPARTMENT_OTHER)

## 2023-09-04 ENCOUNTER — Ambulatory Visit (HOSPITAL_BASED_OUTPATIENT_CLINIC_OR_DEPARTMENT_OTHER): Payer: No Typology Code available for payment source | Admitting: Cardiovascular Disease

## 2023-09-04 ENCOUNTER — Encounter (HOSPITAL_BASED_OUTPATIENT_CLINIC_OR_DEPARTMENT_OTHER): Payer: Self-pay | Admitting: Emergency Medicine

## 2023-09-04 ENCOUNTER — Observation Stay (HOSPITAL_BASED_OUTPATIENT_CLINIC_OR_DEPARTMENT_OTHER)
Admission: EM | Admit: 2023-09-04 | Discharge: 2023-09-06 | Disposition: A | Discharge status: Home / Self Care | Attending: Internal Medicine | Admitting: Internal Medicine

## 2023-09-04 VITALS — BP 118/80 | HR 105 | Ht 68.0 in | Wt 237.5 lb

## 2023-09-04 DIAGNOSIS — Z6836 Body mass index (BMI) 36.0-36.9, adult: Secondary | ICD-10-CM | POA: Insufficient documentation

## 2023-09-04 DIAGNOSIS — I4892 Unspecified atrial flutter: Secondary | ICD-10-CM | POA: Diagnosis not present

## 2023-09-04 DIAGNOSIS — M25562 Pain in left knee: Secondary | ICD-10-CM | POA: Insufficient documentation

## 2023-09-04 DIAGNOSIS — K7031 Alcoholic cirrhosis of liver with ascites: Secondary | ICD-10-CM

## 2023-09-04 DIAGNOSIS — C649 Malignant neoplasm of unspecified kidney, except renal pelvis: Secondary | ICD-10-CM | POA: Diagnosis not present

## 2023-09-04 DIAGNOSIS — D649 Anemia, unspecified: Secondary | ICD-10-CM | POA: Diagnosis not present

## 2023-09-04 DIAGNOSIS — I5032 Chronic diastolic (congestive) heart failure: Secondary | ICD-10-CM | POA: Diagnosis not present

## 2023-09-04 DIAGNOSIS — Z79899 Other long term (current) drug therapy: Secondary | ICD-10-CM | POA: Insufficient documentation

## 2023-09-04 DIAGNOSIS — Z8673 Personal history of transient ischemic attack (TIA), and cerebral infarction without residual deficits: Secondary | ICD-10-CM | POA: Diagnosis not present

## 2023-09-04 DIAGNOSIS — I5033 Acute on chronic diastolic (congestive) heart failure: Secondary | ICD-10-CM | POA: Insufficient documentation

## 2023-09-04 DIAGNOSIS — I10 Essential (primary) hypertension: Secondary | ICD-10-CM | POA: Diagnosis not present

## 2023-09-04 DIAGNOSIS — I2510 Atherosclerotic heart disease of native coronary artery without angina pectoris: Secondary | ICD-10-CM | POA: Insufficient documentation

## 2023-09-04 DIAGNOSIS — R188 Other ascites: Principal | ICD-10-CM | POA: Insufficient documentation

## 2023-09-04 DIAGNOSIS — E66812 Obesity, class 2: Secondary | ICD-10-CM | POA: Insufficient documentation

## 2023-09-04 DIAGNOSIS — J449 Chronic obstructive pulmonary disease, unspecified: Secondary | ICD-10-CM | POA: Insufficient documentation

## 2023-09-04 DIAGNOSIS — K219 Gastro-esophageal reflux disease without esophagitis: Secondary | ICD-10-CM | POA: Insufficient documentation

## 2023-09-04 DIAGNOSIS — I130 Hypertensive heart and chronic kidney disease with heart failure and stage 1 through stage 4 chronic kidney disease, or unspecified chronic kidney disease: Secondary | ICD-10-CM | POA: Insufficient documentation

## 2023-09-04 DIAGNOSIS — Z87891 Personal history of nicotine dependence: Secondary | ICD-10-CM | POA: Insufficient documentation

## 2023-09-04 DIAGNOSIS — K7290 Hepatic failure, unspecified without coma: Secondary | ICD-10-CM | POA: Diagnosis not present

## 2023-09-04 DIAGNOSIS — N1832 Chronic kidney disease, stage 3b: Secondary | ICD-10-CM

## 2023-09-04 DIAGNOSIS — I484 Atypical atrial flutter: Secondary | ICD-10-CM | POA: Diagnosis not present

## 2023-09-04 DIAGNOSIS — N1831 Chronic kidney disease, stage 3a: Secondary | ICD-10-CM | POA: Insufficient documentation

## 2023-09-04 DIAGNOSIS — I739 Peripheral vascular disease, unspecified: Secondary | ICD-10-CM | POA: Insufficient documentation

## 2023-09-04 DIAGNOSIS — M7989 Other specified soft tissue disorders: Secondary | ICD-10-CM | POA: Diagnosis present

## 2023-09-04 DIAGNOSIS — K7460 Unspecified cirrhosis of liver: Secondary | ICD-10-CM | POA: Diagnosis not present

## 2023-09-04 LAB — URINALYSIS, ROUTINE W REFLEX MICROSCOPIC
Bacteria, UA: NONE SEEN
Bilirubin Urine: NEGATIVE
Glucose, UA: NEGATIVE mg/dL
Ketones, ur: NEGATIVE mg/dL
Leukocytes,Ua: NEGATIVE
Nitrite: NEGATIVE
Specific Gravity, Urine: 1.016 (ref 1.005–1.030)
pH: 5.5 (ref 5.0–8.0)

## 2023-09-04 LAB — COMPREHENSIVE METABOLIC PANEL WITH GFR
ALT: 7 U/L (ref 0–44)
AST: 28 U/L (ref 15–41)
Albumin: 4.3 g/dL (ref 3.5–5.0)
Alkaline Phosphatase: 89 U/L (ref 38–126)
Anion gap: 12 (ref 5–15)
BUN: 13 mg/dL (ref 8–23)
CO2: 23 mmol/L (ref 22–32)
Calcium: 9.7 mg/dL (ref 8.9–10.3)
Chloride: 105 mmol/L (ref 98–111)
Creatinine, Ser: 1.49 mg/dL — ABNORMAL HIGH (ref 0.61–1.24)
GFR, Estimated: 51 mL/min — ABNORMAL LOW (ref >60)
Glucose, Bld: 86 mg/dL (ref 70–99)
Potassium: 4.5 mmol/L (ref 3.5–5.1)
Sodium: 139 mmol/L (ref 135–145)
Total Bilirubin: 1.1 mg/dL (ref 0.0–1.2)
Total Protein: 8.1 g/dL (ref 6.5–8.1)

## 2023-09-04 LAB — LIPASE, BLOOD: Lipase: 29 U/L (ref 11–51)

## 2023-09-04 LAB — CBC
HCT: 32.4 % — ABNORMAL LOW (ref 39.0–52.0)
Hemoglobin: 10.2 g/dL — ABNORMAL LOW (ref 13.0–17.0)
MCH: 25.2 pg — ABNORMAL LOW (ref 26.0–34.0)
MCHC: 31.5 g/dL (ref 30.0–36.0)
MCV: 80 fL (ref 80.0–100.0)
Platelets: 107 10*3/uL — ABNORMAL LOW (ref 150–400)
RBC: 4.05 MIL/uL — ABNORMAL LOW (ref 4.22–5.81)
RDW: 18.7 % — ABNORMAL HIGH (ref 11.5–15.5)
WBC: 4.5 10*3/uL (ref 4.0–10.5)
nRBC: 0 % (ref 0.0–0.2)

## 2023-09-04 LAB — PRO BRAIN NATRIURETIC PEPTIDE: Pro Brain Natriuretic Peptide: 2814 pg/mL — ABNORMAL HIGH (ref <300.0)

## 2023-09-04 LAB — AMMONIA: Ammonia: 25 umol/L (ref 9–35)

## 2023-09-04 MED ORDER — POLYETHYLENE GLYCOL 3350 17 G PO PACK: 17.0000 g | PACK | Freq: Every day | ORAL | Status: DC | PRN

## 2023-09-04 MED ORDER — FERROUS SULFATE 325 (65 FE) MG PO TABS
325.0000 mg | ORAL_TABLET | ORAL | Status: DC
  Administered 2023-09-05: 325 mg via ORAL
  Filled 2023-09-04: qty 1

## 2023-09-04 MED ORDER — METOPROLOL SUCCINATE ER 25 MG PO TB24
12.5000 mg | ORAL_TABLET | Freq: Every day | ORAL | Status: DC
  Administered 2023-09-04 – 2023-09-06 (×3): 12.5 mg via ORAL
  Filled 2023-09-04 (×3): qty 1

## 2023-09-04 MED ORDER — PANTOPRAZOLE SODIUM 40 MG PO TBEC
80.0000 mg | DELAYED_RELEASE_TABLET | Freq: Every day | ORAL | Status: DC
  Administered 2023-09-04 – 2023-09-06 (×3): 80 mg via ORAL
  Filled 2023-09-04 (×3): qty 2

## 2023-09-04 MED ORDER — THIAMINE MONONITRATE 100 MG PO TABS
100.0000 mg | ORAL_TABLET | Freq: Every day | ORAL | Status: DC
  Administered 2023-09-04 – 2023-09-06 (×3): 100 mg via ORAL
  Filled 2023-09-04 (×3): qty 1

## 2023-09-04 MED ORDER — ATORVASTATIN CALCIUM 40 MG PO TABS
40.0000 mg | ORAL_TABLET | Freq: Every day | ORAL | Status: DC
  Administered 2023-09-05 – 2023-09-06 (×2): 40 mg via ORAL
  Filled 2023-09-04 (×2): qty 1

## 2023-09-04 MED ORDER — ALLOPURINOL 100 MG PO TABS
50.0000 mg | ORAL_TABLET | Freq: Every day | ORAL | Status: DC
  Administered 2023-09-04 – 2023-09-06 (×3): 50 mg via ORAL
  Filled 2023-09-04 (×3): qty 1

## 2023-09-04 MED ORDER — ACETAMINOPHEN 325 MG PO TABS: 650.0000 mg | ORAL_TABLET | Freq: Four times a day (QID) | ORAL | Status: DC | PRN

## 2023-09-04 MED ORDER — BRIMONIDINE TARTRATE 0.15 % OP SOLN
1.0000 [drp] | Freq: Two times a day (BID) | OPHTHALMIC | Status: DC
  Administered 2023-09-04 – 2023-09-06 (×4): 1 [drp] via OPHTHALMIC
  Filled 2023-09-04: qty 5

## 2023-09-04 MED ORDER — OXYCODONE HCL 5 MG PO TABS: 5.0000 mg | ORAL_TABLET | ORAL | Status: DC | PRN

## 2023-09-04 MED ORDER — ALBUTEROL SULFATE (2.5 MG/3ML) 0.083% IN NEBU: 2.5000 mg | INHALATION_SOLUTION | RESPIRATORY_TRACT | Status: DC | PRN

## 2023-09-04 MED ORDER — FOLIC ACID 1 MG PO TABS
1.0000 mg | ORAL_TABLET | Freq: Every day | ORAL | Status: DC
  Administered 2023-09-04 – 2023-09-06 (×3): 1 mg via ORAL
  Filled 2023-09-04 (×3): qty 1

## 2023-09-04 MED ORDER — FUROSEMIDE 10 MG/ML IJ SOLN
40.0000 mg | Freq: Two times a day (BID) | INTRAMUSCULAR | Status: DC
  Administered 2023-09-04 – 2023-09-05 (×2): 40 mg via INTRAVENOUS
  Filled 2023-09-04 (×2): qty 4

## 2023-09-04 MED ORDER — ACETAMINOPHEN 650 MG RE SUPP: 650.0000 mg | Freq: Four times a day (QID) | RECTAL | Status: DC | PRN

## 2023-09-04 MED ORDER — FUROSEMIDE 10 MG/ML IJ SOLN
60.0000 mg | Freq: Once | INTRAMUSCULAR | Status: AC
  Administered 2023-09-04: 60 mg via INTRAVENOUS
  Filled 2023-09-04: qty 6

## 2023-09-04 MED ORDER — SPIRONOLACTONE 25 MG PO TABS: 50.0000 mg | ORAL_TABLET | Freq: Every day | ORAL | Status: DC

## 2023-09-04 NOTE — H&P (Addendum)
 History and Physical    Patient: John [name] VHQ:[id number] DOB: 04/13/1956 DOA: 09/04/2023 DOS: the patient was seen and examined on 09/04/2023 PCP: Patient, No Pcp Per  Patient coming from: Home  Chief Complaint: Abdominal distention, Leg swelling Chief Complaint  Patient presents with   Leg Swelling   HPI: John [name] is a 68 y.o. male with medical history significant of chronic diastolic congestive heart failure, HTN, CKD stage IIIa, EtOH cirrhosis with varices and ascites requiring serial paracentesis, EtOH abuse in remission, COPD, renal cell carcinoma s/p cryoablation, hepatitis C, seizure disorder, polysubstance abuse (cocaine/marijuana), anemia, thrombocytopenia, gout, PAD, CVA, obesity who presented to [facility] with progressive abdominal distention and lower extremity edema. Patient was beening seen in follow-up by cardiology outpatient; Dr. Theodis Fiscal; who directed the patient to the ED to seek admission for aggressive management of his symptoms. Patient recently discharged on 08/21/2023 for similar complaint and reports has been non-compliant ("out") of his medications for at least one week.  Patient denies headache, no fever/chills/night sweats, no nausea/vomiting/diarrhea, no abdominal pain, no chest pain, no palpitations, no focal weakness, no cough/congestion, no paresthesias.  In the ED, temperature 97.9 F, HR 99, RR 20, BP 129/103, SpO2 97% on room air.  WBC 4.5, hemoglobin 10.2, platelet count 107.  Sodium 139, potassium 4.5, chloride 105, CO2 23, glucose 86, BUN 13, creat 1.49, lipase 29, AST 28, ALT 7, total bilirubin 1.1.  Ammonia level 25.  BNP 2814.0.  Urinalysis with trace hemoglobin, trace protein otherwise unrevealing.  Chest x-ray with no active cardiopulmonary disease process.  CT abdomen/pelvis without contrast with cirrhosis, moderate ascites, new since prior CT, no bowel obstruction, normal appendix, 15 mm indeterminate left renal upper pole  lesion.  Patient received IV Lasix  in the ED.  EDP consulted [facility] for admission and patient was transferred to [facility] for further evaluation and management of decompensated cirrhosis secondary to medical noncompliance.   Review of Systems: As mentioned in the history of present illness. All other systems reviewed and are negative. Past Medical History:  Diagnosis Date   Allergy    Arthritis    oa back   Asthma    CAP (community acquired pneumonia) 05/23/2015   Cataract    both eyes   CHF (congestive heart failure) (HCC) 2001   sees primary for   Chronic back pain    Chronic kidney disease    ckd 3   Cirrhosis (HCC)    alcoholic with h/o varices, ascites   Coronary artery disease    nonobstrucrtibe   ETOH abuse    GERD (gastroesophageal reflux disease)    Gout    Hepatitis    hepatitic c 2018 24 week tx with ecuplipsa, no hepatitic c detected after tx   History of blood transfusion 8-9- yrs ago   Hypertension    Optic neuropathy, left    PAD (peripheral artery disease) (HCC)    slight  primary manages   Polysubstance abuse (HCC)    Primary hypertension    Renal cell carcinoma (HCC) 2016, 2018 and 2022   left ablation   Seizures [facility])    age 10 none since   Sickle cell trait (HCC)    Stroke (HCC) 1998   "light no problems with"   Uses walker 12/04/2020   Wears dentures    Wears glasses    Past Surgical History:  Procedure Laterality Date   ARTHRODESIS METATARSALPHALANGEAL JOINT (MTPJ) Right 12/07/2020   Procedure: ARTHRODESIS METATARSALPHALANGEAL JOINT (MTPJ);  Surgeon: Dot Gazella, DPM;  Location: [facility];  Service: Podiatry;  Laterality: Right;   COLONOSCOPY  2021   ESOPHAGOGASTRODUODENOSCOPY N/A 08/14/2014   Procedure: ESOPHAGOGASTRODUODENOSCOPY (EGD);  Surgeon: Asencion Blacksmith, MD;  Location: [facility];  Service: Endoscopy;  Laterality: N/A;   ESOPHAGOGASTRODUODENOSCOPY (EGD) WITH PROPOFOL  N/A 06/03/2017   Procedure:  ESOPHAGOGASTRODUODENOSCOPY (EGD) WITH PROPOFOL ;  Surgeon: Asencion Blacksmith, MD;  Location: [facility];  Service: Endoscopy;  Laterality: N/A;   HALLUX FUSION Left 07/28/2020   Procedure: HALLUX FUSION MPJ;  Surgeon: Dot Gazella, DPM;  Location: [facility];  Service: Podiatry;  Laterality: Left;   HAMMER TOE SURGERY Left 07/28/2020   Procedure: HAMMER TOE CORRECTION  2,3,AND4 LEFT FOOT;  Surgeon: Dot Gazella, DPM;  Location: [facility];  Service: Podiatry;  Laterality: Left;   HAMMER TOE SURGERY Right 12/07/2020   Procedure: HAMMER TOE CORRECTION  2-4;  Surgeon: Dot Gazella, DPM;  Location: [facility] ;  Service: Podiatry;  Laterality: Right;   IR RADIOLOGIST EVAL & MGMT  09/25/2016   IR RADIOLOGIST EVAL & MGMT  12/10/2016   IR RADIOLOGIST EVAL & MGMT  03/25/2018   IR RADIOLOGIST EVAL & MGMT  03/24/2019   IR RADIOLOGIST EVAL & MGMT  05/17/2020   IR RADIOLOGIST EVAL & MGMT  08/03/2020   IR RADIOLOGIST EVAL & MGMT  11/22/2020   IR RADIOLOGIST EVAL & MGMT  05/31/2021   METATARSAL HEAD EXCISION Left 07/28/2020   Procedure: METATARSAL HEAD EXCISION TOES 2,3,AND 4  LEFT FOOT;  Surgeon: Dot Gazella, DPM;  Location: [facility];  Service: Podiatry;  Laterality: Left;   METATARSAL OSTEOTOMY Right 12/07/2020   Procedure: METATARSAL OSTEOTOMY TOES 2-4 RIGHT FOOT;  Surgeon: Dot Gazella, DPM;  Location: [facility];  Service: Podiatry;  Laterality: Right;   none     RADIOFREQUENCY ABLATION Left 11/08/2016   Procedure: LEFT RENAL CRYOABLATION;  Surgeon: Lucinda Saber, MD;  Location: [facility];  Service: Anesthesiology;  Laterality: Left;   RADIOLOGY WITH ANESTHESIA N/A 08/15/2014   Procedure: RADIOLOGY WITH ANESTHESIA;  Surgeon: Lucinda Saber, MD;  Location: [facility];  Service: Radiology;  Laterality: N/A;   RADIOLOGY WITH ANESTHESIA Left 07/05/2020   Procedure: CT CRYOABLATION;  Surgeon: Lucinda Saber, MD;  Location: [facility];  Service: Anesthesiology;  Laterality: Left;   UPPER GASTROINTESTINAL ENDOSCOPY     Social History:  reports that he quit smoking about 12 years ago. His smoking use included cigarettes. He started smoking about 32 years ago. He has a 6 pack-year smoking history. He has never used smokeless tobacco. He reports current drug use. Drugs: Cocaine and Marijuana. He reports that he does not drink alcohol.  Allergies  Allergen Reactions   Penicillins Other (See Comments)    Convulsions, Patient could not walk, childhood allergy     Family History  Problem Relation Age of Onset   Hypertension Mother        Living   Kidney disease Mother    Diabetes Mother    Heart disease Mother    Hypertension Father        Deceased, 67   Ulcers Father    Stomach cancer Father    Hypertension Brother    Diabetes Brother    Kidney disease Brother    Hypertension Sister    Colon cancer Neg Hx    Esophageal cancer Neg Hx    Rectal cancer Neg Hx  Prior to Admission medications   Medication Sig Start Date End Date Taking? Authorizing Provider  acetaminophen  (TYLENOL ) 325 MG tablet Take 650 mg by mouth every 8 (eight) hours as needed for mild pain (pain score 1-3) or moderate pain (pain score 4-6). Do not exceed 6 tabs per day.   Yes [provider]  allopurinol  (ZYLOPRIM ) 100 MG tablet Take 0.5 tablets (50 mg total) by mouth daily. 08/21/23  Yes Lonita Roach, MD  atorvastatin  (LIPITOR) 40 MG tablet Take 1 tablet (40 mg total) by mouth daily. 08/21/23  Yes Lonita Roach, MD  brimonidine  (ALPHAGAN ) 0.15 % ophthalmic solution Place 1 drop into both eyes 2 (two) times daily.   Yes [provider]  FEROSUL 325 (65 Fe) MG tablet Take 325 mg by mouth every Monday, Wednesday, and Friday.   Yes [provider]  FLOVENT  HFA 220 MCG/ACT inhaler Inhale 2 puffs into the lungs in the morning and at bedtime. 01/27/19  Yes [provider]  folic acid  (FOLVITE ) 1 MG  tablet Take 1 tablet (1 mg total) by mouth daily. 05/15/23  Yes Krishnan, Gokul, MD  furosemide  (LASIX ) 40 MG tablet Take 1 tablet (40 mg total) by mouth daily. 08/21/23 11/19/23 Yes Lonita Roach, MD  ketoconazole  (NIZORAL ) 2 % shampoo Apply 1 Application topically See admin instructions. Shampoo the scalp 2 times a week   Yes [provider]  lactulose  (CHRONULAC ) 10 GM/15ML solution Take 30 mLs (20 g total) by mouth daily as needed for mild constipation. 08/21/23  Yes Lonita Roach, MD  melatonin 5 MG TABS Take 5 mg by mouth at bedtime.   Yes [provider]  Menthol -Methyl Salicylate (SALONPAS PAIN RELIEF PATCH) PTCH Apply 1 patch topically See admin instructions. Apply one patch to the mid-back & one to the lower back in the morning and remove approx 12 hours later   Yes [provider]  metoprolol  succinate (TOPROL  XL) 25 MG 24 hr tablet Take 0.5 tablets (12.5 mg total) by mouth daily. 08/21/23  Yes Lonita Roach, MD  omeprazole  (PRILOSEC) 40 MG capsule Take 1 capsule (40 mg total) by mouth daily before breakfast. 08/21/23  Yes Lonita Roach, MD  oxyCODONE  (OXY IR/ROXICODONE ) 5 MG immediate release tablet Take 1 tablet (5 mg total) by mouth daily. 07/08/23  Yes Danford, Willis Harter, MD  potassium chloride  (KLOR-CON ) 10 MEQ tablet Take 10 mEq by mouth daily.   Yes [provider]  PROAIR  HFA 108 (90 Base) MCG/ACT inhaler Inhale 2 puffs into the lungs every 6 (six) hours as needed for wheezing or shortness of breath.   Yes [provider]  spironolactone  (ALDACTONE ) 50 MG tablet Take 1 tablet (50 mg total) by mouth daily. 08/21/23  Yes Lonita Roach, MD  terbinafine (LAMISIL) 1 % cream Apply 1 Application topically See admin instructions. Apply to both feet 2 times a day as directed   Yes [provider]  thiamine  (VITAMIN B-1) 100 MG tablet Take 1 tablet (100 mg total) by mouth daily. 05/15/23  Yes Maylene Spear, MD  feeding  supplement (ENSURE ENLIVE / ENSURE PLUS) LIQD Take 237 mLs by mouth 2 (two) times daily between meals. 07/08/23   Danford, Willis Harter, MD  fluticasone  (FLONASE ) 50 MCG/ACT nasal spray Place 1 spray into both nostrils in the morning.    [provider]  polyethylene glycol (MIRALAX  / GLYCOLAX ) 17 g packet Take 17 g by mouth daily. Patient not taking: Reported on 08/20/2023 07/08/23  Ephriam Hashimoto, MD    Physical Exam: Vitals:   09/04/23 1500 09/04/23 1602 09/04/23 1809 09/04/23 2026  BP: (!) 117/90 109/79 109/79 114/78  Pulse: 89 89 89 87  Resp: 16 18  18   Temp:  97.8 F (36.6 C)  98.1 F (36.7 C)  TempSrc:    Oral  SpO2: 95% 99%  97%  Weight:       Physical Exam GEN: 68 yo male in NAD, alert and oriented x 3, chronically ill appearance, appears older than stated age HEENT: NCAT, PERRL, EOMI, sclera clear, MMM PULM: CTAB w/o wheezes/crackles, normal respiratory effort, on room air CV: RRR w/o M/G/R GI: abd soft, + abdominal distention, NTTP, NABS, no R/G/M MSK: + LE edema peripheral edema, moves all extremities independently, with preserved muscle strength NEURO: CN II-XII intact, no focal deficits, sensation to light touch intact PSYCH: normal mood/affect Integumentary: + blister wounds to RLE, otherwise skin dry/intact, no rashes or wounds   Assessment and Plan:  Acute on chronic diastolic congestive heart failure Patient presenting to ED with progressive lower extremity edema, abdominal distention, shortness of breath.  Patient is afebrile without leukocytosis.  Chest x-ray negative for pulmonary edema or focal consolidation.  BNP elevated at 2814.0.  Noncompliant with home Aldactone  and Lasix ; no medications for at least week per patient.  TTE 05/10/2023 with LVEF 55 to 60%, RV systolic function low normal, normal PASP, left atrium moderately dilated, mild mitral regurgitation. -- Furosemide  40 mg IV twice daily -- 2 g sodium restricted diet; 1200 mL fluid  restriction -- Strict I's and O's and daily weights -- BMP/BNP daily   EtOH cirrhosis with ascites, decompensated Patient presenting with progressive abdominal distention in the setting of noncompliance with his home furosemide  and spironolactone . -- IR consulted for ultrasound-guided paracentesis -- Continue Lasix  as above; fluid restriction, low-salt diet -- Hold home spironolactone  for now with aggressive IV diuresis  Left knee pain Patient reports instability with left lower extremity with pain to his knee.  Denies any trauma/falls. -- Check knee x-ray -- PT evaluation   CKD stage IIIa Baseline creatinine 1.8 - 2.0.  Creatinine on admission 1.49, stable -- BMP daily   Essential hypertension Persistent atrial for flutter Rate controlled.  Not on anticoagulation due to history of cirrhosis, esophageal varices and prior GI bleed. -- Metoprolol  succinate 12.5 mg p.o. daily -- Continue monitor on telemetry   COPD Not oxygen  dependent, on room air with adequate SpO2. -- Albuterol  neb every 4 hours as needed wheezing/shortness of breath   Renal cell carcinoma S/p cryoablation.  Outpatient follow-up with urology   PAD Hx CVA -- Atorvastatin  40 mg p.o. daily -- Not on antiplatelet due to thrombocytopenia, anemia   Anemia/thrombocytopenia, chronic Hemoglobin 9.0, platelet count 104, stable.   Hx gout -- Allopurinol  100 mg p.o. daily   GERD -- Protonix  80 mg p.o. daily (substituted for home Nexium)   Obesity class II Body mass index is 36.2 kg/m.     Advance Care Planning:   Code Status: Full Code   Consults: Interventional radiology for paracentesis  Family Communication: No family present at bedside  Severity of Illness: The appropriate patient status for this patient is OBSERVATION. Observation status is judged to be reasonable and necessary in order to provide the required intensity of service to ensure the patient's safety. The patient's presenting symptoms,  physical exam findings, and initial radiographic and laboratory data in the context of their medical condition is felt to place them at  decreased risk for further clinical deterioration. Furthermore, it is anticipated that the patient will be medically stable for discharge from the hospital within 2 midnights of admission.   Author: Rema Care [place], DO 09/04/2023 9:03 PM  For on call review [email or web address].

## 2023-09-04 NOTE — Progress Notes (Signed)
 Cardiology Office Note:  .   Date:  09/04/2023  ID:  John [name], DOB [birth year], MRN [id number] PCP: Patient, No Pcp Per  John [name] Cardiologist:  Maudine Sos, MD    History of Present Illness: John [name]    John [name] is a 68 y.o. male with HFpEF, atrial fibrillation/flutter, hypertension, mild ascending aorta aneurysm, CKD 3b, and alcoholic cirrhosis here for follow up.  He was admitted 04/2023 with atrial fibrillation/flutter and acute HFpEF.  He was not started on anticoagulation due to a history of esophageal varices and GI bleeding.  He was diuresed and required paracentesis.  He had acute on chronic CKD.  He followed up with John Single, PA-C 04/2023 and was doing well.  Heart rate was in the 100s.  Carvedilol  was discontinued in the hospital due to hypotension so Scott added metopprolol.  He was referred to EP  He was admitted 06/2023 with acute on chronic HFpEF and cirrhosis.  John [name] was admitted 07/2023 with decompensated cirrhosis and atrial flutter.  He had not been taking his diuretics as prescribed and required paracentesis.   Discussed the use of AI scribe software for clinical note transcription with the patient, who gave verbal consent to proceed.  History of Present Illness John [name] is a 68 year old male who presents with left knee instability and swelling.  He experiences left knee instability, describing it as 'giving out' while walking, though he has not fallen. He uses a walker for mobility and notes both legs feel slow.  His weight has increased from 213 pounds in March to 237 pounds currently, with a previous weight of approximately 224 pounds when last discharged from the hospital.  He has significant swelling in his legs, describing them as swollen from top to bottom. He attributes some of the swelling to inconsistent use of his diuretics, Lasix  and spironolactone , due to concerns about medication theft at his residence,  [address], [address]. He estimates missing his fluid pills once or twice a week. When he takes them, he experiences regular urination, but notes decreased frequency when doses are missed.  He mentions a slight bloating sensation in his abdomen but does not find it significant. No shortness of breath when lying down, but he experiences it with extensive walking.  He prepares his own meals and tries to avoid adding salt, acknowledging his high blood pressure and the relationship between salt intake and fluid retention.  He is on atorvastatin  and metoprolol  but admits to taking his heart rate medication only sometimes. He expresses concern about medication theft, having noticed missing medications after they were delivered to his residence.  ROS:  As per HPI  Studies Reviewed: John [name]   EKG Interpretation Date/Time:  Thursday Sep 04 2023 08:51:59 EDT Ventricular Rate:  105 PR Interval:    QRS Duration:  82 QT Interval:  356 QTC Calculation: 470 R Axis:   51  Text Interpretation: Atrial flutter with variable A-V block T wave abnormality, consider inferolateral ischemia No significant change since last tracing Confirmed by Maudine Sos ([id number]) on 09/04/2023 9:00:03 AM   Echo 05/10/23: IMPRESSIONS    1. Left ventricular ejection fraction, by estimation, is 55 to 60%. The  left ventricle has normal function. The left ventricle has no regional  wall motion abnormalities. There is mild left ventricular hypertrophy.  Left ventricular diastolic parameters  are indeterminate.   2. Right ventricular systolic function is low normal. The right  ventricular size is normal. There is normal  pulmonary artery systolic  pressure.   3. Left atrial size was moderately dilated.   4. The mitral valve is normal in structure. Mild mitral valve  regurgitation.   5. The aortic valve is normal in structure. Aortic valve regurgitation is  not visualized.   6. Aortic dilatation noted. There is moderate dilatation of the  aortic  root, measuring 44 mm. There is mild dilatation of the ascending aorta,  measuring 42 mm.    Risk Assessment/Calculations:    CHA2DS2-VASc Score = 3   This indicates a 3.2% annual risk of stroke. The patient's score is based upon: CHF History: 1 HTN History: 1 Diabetes History: 0 Stroke History: 0 Vascular Disease History: 0 Age Score: 1 Gender Score: 0            Physical Exam:   VS:  BP 118/80 (BP Location: Right Arm, Patient Position: Sitting, Cuff Size: Normal)   Pulse (!) 105   Ht 5\' 8"  (1.727 m)   Wt 237 lb 8 oz (107.7 kg)   SpO2 96%   BMI 36.11 kg/m  , BMI Body mass index is 36.11 kg/m. GENERAL:  Well appearing HEENT: Pupils equal round and reactive, fundi not visualized, oral mucosa unremarkable NECK:  + jugular venous distention to the mandible, waveform within normal limits, carotid upstroke brisk and symmetric, no bruits, no thyromegaly LUNGS:  Clear to auscultation bilaterally HEART:  Tachycardic.  Irregularly irregular.  PMI not displaced or sustained,S1 and S2 within normal limits, no S3, no S4, no clicks, no rubs, no murmurs ABD:  Flat, positive bowel sounds normal in frequency in pitch, no bruits, no rebound, no guarding, no midline pulsatile mass, no hepatomegaly, no splenomegaly EXT:  2 plus pulses throughout, 2+ LE edema to upper thighs, no cyanosis no clubbing SKIN:  No rashes no nodules NEURO:  Cranial nerves II through XII grossly intact, motor grossly intact throughout PSYCH:  Cognitively intact, oriented to person place and time   ASSESSMENT AND PLAN: .    Assessment & Plan # Acute on chronic HFpEF: Significant fluid retention with weight increase from 213 lbs to 237 lbs since March. Exacerbated by inconsistent diuretic use and possible dietary salt intake. No orthopnea but dyspnea on exertion. Twenty pounds of fluid needs removal. - Admit for IV diuretics to rapidly reduce fluid overload.  Likely will need paracentesis for cirrhosis and  ascites. - Monitor kidney function during diuresis. - Educated on daily diuretic use and low-salt diet.  # Persistent atrial fibrillation:  Rate uncontrolled.  He is unclear about if/when he takes metoprolol .  Not a candidate for anticoagulation 2/2 cirrhosis and esophageal varices.   # Hypertension Suboptimal management due to medication non-adherence. Metoprolol  crucial for blood pressure control and preventing hospitalizations. - Ensure metoprolol  administered before discharge. - Coordinate with social work for medication delivery plan.  # Medication non-adherence Significant issue with missing doses of Lasix , spironolactone , and metoprolol . Suspected medication theft contributing to non-adherence. - Ensure all medications provided directly before discharge. - Involve social work to address medication delivery and adherence issues.  Knee instability Reports left knee instability with episodes of giving out while walking. Uses a walker for mobility.  Follow-up Necessary for continued management of fluid retention and medication adherence. - Coordinate with [facility] for transportation and follow-up care arrangements. -Discharge to ED for hospital admission.  Recommend medicine admission with cardiology consult    Dispo: f/u to be determined in hospital  Signed, Maudine Sos, MD

## 2023-09-04 NOTE — ED Provider Notes (Signed)
 [place] EMERGENCY DEPARTMENT AT [facility] Provider Note   CSN: [id number] Arrival date & time: 09/04/23  4098     History  Chief Complaint  Patient presents with   Leg Swelling    John [name] is a 68 y.o. male with significant past medical history of kidney disease, heart failure, stasis dermatitis, polysubstance abuse, alcoholic cirrhosis, gastric varices, malnutrition who presents with concern for abdominal and bilateral leg swollen.  Legs are starting to weep.  He has not been taking his home medications, although he cannot tell me why.  He supposed be taking Lasix  daily as well as lactulose , and spironolactone .  He says that he has not had his medications in around a week.  HPI     Home Medications Prior to Admission medications   Medication Sig Start Date End Date Taking? Authorizing Provider  allopurinol  (ZYLOPRIM ) 100 MG tablet Take 0.5 tablets (50 mg total) by mouth daily. 08/21/23   Lonita Roach, MD  atorvastatin  (LIPITOR) 40 MG tablet Take 1 tablet (40 mg total) by mouth daily. 08/21/23   Lonita Roach, MD  brimonidine  (ALPHAGAN ) 0.15 % ophthalmic solution Place 1 drop into both eyes 2 (two) times daily.    [provider]  feeding supplement (ENSURE ENLIVE / ENSURE PLUS) LIQD Take 237 mLs by mouth 2 (two) times daily between meals. 07/08/23   Ephriam Hashimoto, MD  FEROSUL 325 (65 Fe) MG tablet Take 325 mg by mouth every Monday, Wednesday, and Friday.    [provider]  FLOVENT  HFA 220 MCG/ACT inhaler Inhale 2 puffs into the lungs in the morning and at bedtime. 01/27/19   [provider]  fluticasone  (FLONASE ) 50 MCG/ACT nasal spray Place 1 spray into both nostrils in the morning.    [provider]  folic acid  (FOLVITE ) 1 MG tablet Take 1 tablet (1 mg total) by mouth daily. 05/15/23   Krishnan, Gokul, MD  furosemide  (LASIX ) 40 MG tablet Take 1 tablet (40 mg total) by mouth daily. 08/21/23 11/19/23   Lonita Roach, MD  ketoconazole  (NIZORAL ) 2 % shampoo Apply 1 Application topically See admin instructions. Shampoo the scalp 2 times a week    [provider]  lactulose  (CHRONULAC ) 10 GM/15ML solution Take 30 mLs (20 g total) by mouth daily as needed for mild constipation. 08/21/23   Lonita Roach, MD  melatonin 5 MG TABS Take 5 mg by mouth at bedtime.    [provider]  Menthol -Methyl Salicylate (SALONPAS PAIN RELIEF PATCH) PTCH Apply 1 patch topically See admin instructions. Apply one patch to the mid-back & one to the lower back in the morning and remove approx 12 hours later    [provider]  metoprolol  succinate (TOPROL  XL) 25 MG 24 hr tablet Take 0.5 tablets (12.5 mg total) by mouth daily. 08/21/23   Lonita Roach, MD  omeprazole  (PRILOSEC) 40 MG capsule Take 1 capsule (40 mg total) by mouth daily before breakfast. 08/21/23   Lonita Roach, MD  oxyCODONE  (OXY IR/ROXICODONE ) 5 MG immediate release tablet Take 1 tablet (5 mg total) by mouth daily. 07/08/23   Danford, Willis Harter, MD  polyethylene glycol (MIRALAX  / GLYCOLAX ) 17 g packet Take 17 g by mouth daily. Patient not taking: Reported on 08/20/2023 07/08/23   Ephriam Hashimoto, MD  potassium chloride  (KLOR-CON ) 10 MEQ tablet Take 10 mEq by mouth daily.    [provider]  PROAIR  HFA 108 (90 Base) MCG/ACT inhaler Inhale 2  puffs into the lungs every 6 (six) hours as needed for wheezing or shortness of breath.    [provider]  spironolactone  (ALDACTONE ) 50 MG tablet Take 1 tablet (50 mg total) by mouth daily. 08/21/23   Lonita Roach, MD  terbinafine (LAMISIL) 1 % cream Apply 1 Application topically See admin instructions. Apply to both feet 2 times a day as directed    [provider]  thiamine  (VITAMIN B-1) 100 MG tablet Take 1 tablet (100 mg total) by mouth daily. 05/15/23   Maylene Spear, MD  TYLENOL  500 MG tablet Take 1,000 mg by mouth every 6 (six)  hours as needed for mild pain (pain score 1-3) or headache.    [provider]      Allergies    Penicillins    Review of Systems   Review of Systems  All other systems reviewed and are negative.   Physical Exam Updated Vital Signs BP (!) 116/101   Pulse 85   Temp 97.9 F (36.6 C) (Oral)   Resp 19   Wt 108 kg   SpO2 96%   BMI 36.20 kg/m  Physical Exam Vitals and nursing note reviewed.  Constitutional:      General: He is not in acute distress.    Appearance: Normal appearance. He is ill-appearing.  HENT:     Head: Normocephalic and atraumatic.  Eyes:     General:        Right eye: No discharge.        Left eye: No discharge.  Cardiovascular:     Rate and Rhythm: Normal rate and regular rhythm.     Heart sounds: No murmur heard.    No friction rub. No gallop.  Pulmonary:     Effort: Pulmonary effort is normal.     Breath sounds: Normal breath sounds.     Comments: Coarse crackles at lung bases, increased work of breathing, wet sounding cough.  No wheezing, stridor. Abdominal:     General: Bowel sounds are normal.     Palpations: Abdomen is soft.     Comments: Diffusely swollen and somewhat indurated although not significantly tender to palpation.  Protuberant and fluid-filled.  Skin:    General: Skin is warm and dry.     Capillary Refill: Capillary refill takes less than 2 seconds.     Comments: Significant bilateral lower extremity edema with several areas of weeping skin tears  Neurological:     Mental Status: He is alert and oriented to person, place, and time.  Psychiatric:        Mood and Affect: Mood normal.        Behavior: Behavior normal.     ED Results / Procedures / Treatments   Labs (all labs ordered are listed, but only abnormal results are displayed) Labs Reviewed  CBC - Abnormal; Notable for the following components:      Result Value   RBC 4.05 (*)    Hemoglobin 10.2 (*)    HCT 32.4 (*)    MCH 25.2 (*)    RDW 18.7 (*)     Platelets 107 (*)    All other components within normal limits  COMPREHENSIVE METABOLIC PANEL WITH GFR - Abnormal; Notable for the following components:   Creatinine, Ser 1.49 (*)    GFR, Estimated 51 (*)    All other components within normal limits  URINALYSIS, ROUTINE W REFLEX MICROSCOPIC - Abnormal; Notable for the following components:   Hgb urine dipstick TRACE (*)  Protein, ur TRACE (*)    All other components within normal limits  PRO BRAIN NATRIURETIC PEPTIDE - Abnormal; Notable for the following components:   Pro Brain Natriuretic Peptide 2,814.0 (*)    All other components within normal limits  LIPASE, BLOOD  AMMONIA    EKG None  Radiology CT ABDOMEN PELVIS WO CONTRAST Result Date: 09/04/2023 CLINICAL DATA:  Abdominal pain. EXAM: CT ABDOMEN AND PELVIS WITHOUT CONTRAST TECHNIQUE: Multidetector CT imaging of the abdomen and pelvis was performed following the standard protocol without IV contrast. RADIATION DOSE REDUCTION: This exam was performed according to the departmental dose-optimization program which includes automated exposure control, adjustment of the mA and/or kV according to patient size and/or use of iterative reconstruction technique. COMPARISON:  CT and pelvis dated 11/13/2021. FINDINGS: Evaluation of this exam is limited in the absence of intravenous contrast. Lower chest: The visualized lung bases are clear. Moderate ascites, new since the prior CT. Hepatobiliary: Cirrhosis. No biliary dilatation. No calcified gallstone. Pancreas: Unremarkable. No pancreatic ductal dilatation or surrounding inflammatory changes. Spleen: Splenomegaly measuring 15 cm in length. Adrenals/Urinary Tract: The adrenal glands are unremarkable. Moderate bilateral renal parenchyma atrophy. There is a 15 mm indeterminate high attenuating left renal upper pole lesion which is not characterized on this CT but may represent a hemorrhagic or proteinaceous cyst. Further evaluation with ultrasound or  MRI on a nonemergent/outpatient basis recommended. There is no hydronephrosis or nephrolithiasis on either side. The visualized ureters and urinary bladder appear unremarkable. Stomach/Bowel: There is no bowel obstruction. The appendix is normal. Vascular/Lymphatic: Mild aortoiliac atherosclerotic disease. The IVC is unremarkable. No portal venous gas. There is no adenopathy. Reproductive: None Other: Diffuse subcutaneous edema and anasarca. Musculoskeletal: Degenerative changes of the spine. No acute osseous pathology. IMPRESSION: 1. Cirrhosis with moderate ascites, new since the prior CT. 2. No bowel obstruction. Normal appendix. 3. A 15 mm indeterminate left renal upper pole lesion. Further evaluation with ultrasound or MRI on a nonemergent/outpatient basis recommended. 4.  Aortic Atherosclerosis (ICD10-I70.0). Electronically Signed   By: Angus Bark M.D.   On: 09/04/2023 12:28   DG Chest Portable 1 View Result Date: 09/04/2023 CLINICAL DATA:  Shortness of breath. EXAM: PORTABLE CHEST 1 VIEW COMPARISON:  Chest radiograph dated 08/20/2023 FINDINGS: Shallow inspiration. No focal consolidation, pleural effusion, or pneumothorax. Stable cardiomegaly. No acute osseous pathology. IMPRESSION: 1. No active disease. 2. Cardiomegaly. Electronically Signed   By: Angus Bark M.D.   On: 09/04/2023 11:04    Procedures Procedures    Medications Ordered in ED Medications  furosemide  (LASIX ) injection 60 mg (60 mg Intravenous Given 09/04/23 1309)    ED Course/ Medical Decision Making/ A&P                                 Medical Decision Making Amount and/or Complexity of Data Reviewed Labs: ordered. Radiology: ordered.  Risk Prescription drug management.   This patient is a 68 y.o. male  who presents to the ED for concern of edema, abdominal swelling  Differential diagnoses prior to evaluation: The emergent differential diagnosis includes, but is not limited to,  chf vs liver failure  exacerbation, COPD, SBP, cellulitis, DVT, kidney failure, stasis dermatitis vs other . This is not an exhaustive differential.   Past Medical History / Co-morbidities / Social History: kidney disease, heart failure, stasis dermatitis, polysubstance abuse, alcoholic cirrhosis, gastric varices, malnutrition   Additional history: Chart reviewed. Pertinent results include: Reviewed lab  work, imaging from recent previous hospitalization, previous echoes, previous US  paracentesis  Physical Exam: Physical exam performed. The pertinent findings include: Comments: Coarse crackles at lung bases, increased work of breathing, wet sounding cough.  No wheezing, stridor. Abdominal:     General: Bowel sounds are normal.     Palpations: Abdomen is soft.     Comments: Diffusely swollen and somewhat indurated although not significantly tender to palpation.  Protuberant and fluid-filled.  Skin:    General: Skin is warm and dry.     Capillary Refill: Capillary refill takes less than 2 seconds.     Comments: Significant bilateral lower extremity edema with several areas of weeping skin tears   Diastolic hypertension 129/103  Lab Tests/Imaging studies: I personally interpreted labs/imaging and the pertinent results include: BNP 2814, quite elevated especially compared to his recent baseline.  CMP with mildly elevated creatinine 1.49 actually slightly improved from his normal baseline.  CBC with anemia, hemoglobin 10.2.  UA with trace hemoglobin, otherwise unremarkable, normal ammonia, normal lipase.  CT abdomen pelvis without contrast shows significant ascites, plain, chest x-ray with cardiomegaly but no significant intrathoracic abnormality.. I agree with the radiologist interpretation.  Cardiac monitoring: EKG obtained and interpreted by myself and attending physician which shows: afib, normal rate   Medications: I ordered medication including lasix  for fluid overload.  I have reviewed the patients home  medicines and have made adjustments as needed.   Consults: Spoke with the hospitalist, Dr. Rufina Cough who agrees to admission for heart failure exacerbation, plan for likely IR drainage of his large volume ascites  After consideration of the diagnostic results and the patients response to treatment, I feel that patient would benefit from admission as discussed above .   Final Clinical Impression(s) / ED Diagnoses Final diagnoses:  None    Rx / DC Orders ED Discharge Orders     None         Nelly Banco, PA-C 09/04/23 1330    Hershel Los, MD 09/04/23 1352

## 2023-09-04 NOTE — Patient Instructions (Signed)
 YOU NEED TO GO DOWNSTAIRS TO EMERGENCY DEPARTMENT FOR EVALUATION OF YOU FLUID RETENTION

## 2023-09-04 NOTE — Plan of Care (Signed)

## 2023-09-04 NOTE — ED Triage Notes (Signed)
 Pt has CHF and states his bilateral legs are swollen for about a week and have started weeping. Pt has not been taking medications,(takes them periodically,no reason why)

## 2023-09-04 NOTE — Plan of Care (Signed)
  Problem: Pain Managment: Goal: General experience of comfort will improve and/or be controlled Outcome: Progressing   Problem: Safety: Goal: Ability to remain free from injury will improve Outcome: Progressing   Problem: Skin Integrity: Goal: Risk for impaired skin integrity will decrease Outcome: Progressing

## 2023-09-04 NOTE — ED Triage Notes (Signed)
 Pt brought in by [facility], pt was seeing a physician today and told to come to ED

## 2023-09-05 ENCOUNTER — Observation Stay (HOSPITAL_COMMUNITY)

## 2023-09-05 DIAGNOSIS — K7290 Hepatic failure, unspecified without coma: Secondary | ICD-10-CM | POA: Diagnosis not present

## 2023-09-05 DIAGNOSIS — K7460 Unspecified cirrhosis of liver: Secondary | ICD-10-CM | POA: Diagnosis not present

## 2023-09-05 HISTORY — PX: IR PARACENTESIS: IMG2679

## 2023-09-05 LAB — COMPREHENSIVE METABOLIC PANEL WITH GFR
ALT: 10 U/L (ref 0–44)
AST: 20 U/L (ref 15–41)
Albumin: 3 g/dL — ABNORMAL LOW (ref 3.5–5.0)
Alkaline Phosphatase: 53 U/L (ref 38–126)
Anion gap: 9 (ref 5–15)
BUN: 14 mg/dL (ref 8–23)
CO2: 26 mmol/L (ref 22–32)
Calcium: 8.9 mg/dL (ref 8.9–10.3)
Chloride: 103 mmol/L (ref 98–111)
Creatinine, Ser: 1.75 mg/dL — ABNORMAL HIGH (ref 0.61–1.24)
GFR, Estimated: 42 mL/min — ABNORMAL LOW (ref >60)
Glucose, Bld: 86 mg/dL (ref 70–99)
Potassium: 4.9 mmol/L (ref 3.5–5.1)
Sodium: 138 mmol/L (ref 135–145)
Total Bilirubin: 1.3 mg/dL — ABNORMAL HIGH (ref 0.0–1.2)
Total Protein: 6.6 g/dL (ref 6.5–8.1)

## 2023-09-05 LAB — CBC
HCT: 27.7 % — ABNORMAL LOW (ref 39.0–52.0)
Hemoglobin: 8.8 g/dL — ABNORMAL LOW (ref 13.0–17.0)
MCH: 24.9 pg — ABNORMAL LOW (ref 26.0–34.0)
MCHC: 31.8 g/dL (ref 30.0–36.0)
MCV: 78.5 fL — ABNORMAL LOW (ref 80.0–100.0)
Platelets: 105 10*3/uL — ABNORMAL LOW (ref 150–400)
RBC: 3.53 MIL/uL — ABNORMAL LOW (ref 4.22–5.81)
RDW: 18.5 % — ABNORMAL HIGH (ref 11.5–15.5)
WBC: 4.5 10*3/uL (ref 4.0–10.5)
nRBC: 0 % (ref 0.0–0.2)

## 2023-09-05 LAB — BODY FLUID CELL COUNT WITH DIFFERENTIAL
Eos, Fluid: 0 %
Lymphs, Fluid: 50 %
Monocyte-Macrophage-Serous Fluid: 33 % — ABNORMAL LOW (ref 50–90)
Neutrophil Count, Fluid: 17 % (ref 0–25)
Total Nucleated Cell Count, Fluid: 174 uL (ref 0–1000)

## 2023-09-05 LAB — PROTIME-INR
INR: 1.4 — ABNORMAL HIGH (ref 0.8–1.2)
Prothrombin Time: 17 s — ABNORMAL HIGH (ref 11.4–15.2)

## 2023-09-05 LAB — BRAIN NATRIURETIC PEPTIDE: B Natriuretic Peptide: 297.1 pg/mL — ABNORMAL HIGH (ref 0.0–100.0)

## 2023-09-05 MED ORDER — LIDOCAINE HCL (PF) 1 % IJ SOLN
10.0000 mL | Freq: Once | INTRAMUSCULAR | Status: DC
  Filled 2023-09-05: qty 10

## 2023-09-05 MED ORDER — GUAIFENESIN ER 600 MG PO TB12
600.0000 mg | ORAL_TABLET | Freq: Two times a day (BID) | ORAL | Status: DC
  Administered 2023-09-05 – 2023-09-06 (×3): 600 mg via ORAL
  Filled 2023-09-05 (×3): qty 1

## 2023-09-05 MED ORDER — FUROSEMIDE 10 MG/ML IJ SOLN: 40.0000 mg | Freq: Every day | INTRAMUSCULAR | Status: DC

## 2023-09-05 MED ORDER — LIDOCAINE HCL 1 % IJ SOLN
INTRAMUSCULAR | Status: AC
  Filled 2023-09-05: qty 20

## 2023-09-05 NOTE — Progress Notes (Signed)
 Heart Failure Navigator Progress Note  Assessed for Heart & Vascular TOC clinic readiness.  Patient does not meet criteria due to established with advanced heart failure clinic, patient of Dr. Alease Amend.   Navigator will sign off.  Jerilyn Monte, PharmD, BCPS Heart Failure Stewardship Pharmacist Phone [phone]

## 2023-09-05 NOTE — Evaluation (Signed)
 Physical Therapy Evaluation Patient Details Name: John [name] MRN: [id number] DOB: 1955/12/07 Today's Date: 09/05/2023  History of Present Illness  Patient is 68 y.o. male presented to [facility] with progressive abdominal distention and LE edema. Patient was being seen in follow-up by cardiology, Dr. Theodis [name], who directed the patient to the ED for management of his symptoms. Patient recently discharged on 08/21/2023 for similar complaint and reports has been non-compliant ("out") of his medications for at least one week. PMH significant of chronic diastolic congestive heart failure, HTN, CKD stage IIIa, EtOH cirrhosis with varices and ascites requiring serial paracentesis, EtOH abuse in remission, COPD, renal cell carcinoma s/p cryoablation, hepatitis C, seizure disorder, polysubstance abuse (cocaine/marijuana), anemia, thrombocytopenia, gout, PAD, CVA, obesity.   Clinical Impression  John [name] is 68 y.o. male admitted with above HPI and diagnosis. Patient is currently limited by functional impairments below (see PT problem list). Patient lives alone and is mod ind with rollator from limited community mobility at baseline. Currently pt mobilizing at [facility] level for bed mob, transfers, and gait with 4WW. Patient will benefit from continued skilled PT interventions to address impairments and progress independence with mobility. Acute PT will follow and progress as able.         If plan is discharge home, recommend the following: Assist for transportation;Help with stairs or ramp for entrance   Can travel by private vehicle        Equipment Recommendations None recommended by PT  Recommendations for Other Services       Functional Status Assessment Patient has had a recent decline in their functional status and demonstrates the ability to make significant improvements in function in a reasonable and predictable amount of time.     Precautions / Restrictions  Precautions Precautions: Fall Recall of Precautions/Restrictions: Intact Restrictions Weight Bearing Restrictions Per Provider Order: No      Mobility  Bed Mobility Overal bed mobility: Needs Assistance Bed Mobility: Supine to Sit     Supine to sit: Contact guard, HOB elevated     General bed mobility comments: CAG for safety, extra time    Transfers Overall transfer level: Needs assistance Equipment used: Rollator (4 wheels) Transfers: Sit to/from Stand Sit to Stand: Contact guard assist           General transfer comment: pt reliant on UE use to power up. extra time to rise. cues for safety alignment prior to sitting in recliner, pt tends to reach out and pivot before in front of chair.    Ambulation/Gait Ambulation/Gait assistance: Contact guard assist Gait Distance (Feet): 70 Feet Assistive device: Rollator (4 wheels) Gait Pattern/deviations: Step-through pattern, Decreased stride length, Shuffle, Trunk flexed Gait velocity: decr     General Gait Details: overall no overt LOB, pt steady. 3 standing rest breaks pt leaning on 4WW for 2 of them. VSS with HR in 110-120's.  Stairs            Wheelchair Mobility     Tilt Bed    Modified Rankin (Stroke Patients Only)       Balance Overall balance assessment: Needs assistance Sitting-balance support: Feet supported Sitting balance-Leahy Scale: Good     Standing balance support: Bilateral upper extremity supported, Reliant on assistive device for balance, During functional activity Standing balance-Leahy Scale: Poor Standing balance comment: reliant on RW  Pertinent Vitals/Pain Pain Assessment Pain Assessment: Faces Pain Location: Rt foot Pain Descriptors / Indicators: Tightness Pain Intervention(s): Limited activity within patient's tolerance, Monitored during session, Repositioned    Home Living Family/patient expects to be discharged to:: Private  residence Living Arrangements: Alone Available Help at Discharge: Neighbor;Available PRN/intermittently Type of Home: Apartment Home Access: Elevator;Stairs to enter (uses Engineer, structural)       Home Layout: One level Home Equipment: Rollator (4 wheels);Rolling Walker (2 wheels);Cane - single point;Grab bars - tub/shower;Grab bars - toilet;BSC/3in1;Shower seat Additional Comments: Aide "John [name]" comes in 5 days a week for about 2 hours that assists with housework    Prior Function Prior Level of Function : Independent/Modified Independent             Mobility Comments: pt reports ind with amb in the home furniture walking, uses SPC or RW or rollator as needed in community. leaves apartment 1-2x/day ADLs Comments: pt reports ind     Extremity/Trunk Assessment   Upper Extremity Assessment Upper Extremity Assessment: Overall WFL for tasks assessed    Lower Extremity Assessment Lower Extremity Assessment: Generalized weakness    Cervical / Trunk Assessment Cervical / Trunk Assessment: Kyphotic  Communication   Communication Communication: No apparent difficulties    Cognition Arousal: Alert Behavior During Therapy: WFL for tasks assessed/performed   PT - Cognitive impairments: No apparent impairments                         Following commands: Intact       Cueing Cueing Techniques: Verbal cues     General Comments      Exercises     Assessment/Plan    PT Assessment Patient needs continued PT services  PT Problem List Decreased strength;Decreased activity tolerance;Decreased balance;Decreased mobility;Decreased knowledge of use of DME;Decreased knowledge of precautions;Decreased safety awareness       PT Treatment Interventions DME instruction;Gait training;Stair training;Functional mobility training;Therapeutic activities;Therapeutic exercise;Balance training;Neuromuscular re-education;Patient/family education    PT Goals (Current goals can be found in  the Care Plan section)  Acute Rehab PT Goals Patient Stated Goal: get home tomorrow PT Goal Formulation: With patient Time For Goal Achievement: 09/19/23 Potential to Achieve Goals: Good    Frequency Min 1X/week     Co-evaluation               AM-PAC PT "6 Clicks" Mobility  Outcome Measure Help needed turning from your back to your side while in a flat bed without using bedrails?: A Little Help needed moving from lying on your back to sitting on the side of a flat bed without using bedrails?: A Little Help needed moving to and from a bed to a chair (including a wheelchair)?: A Little Help needed standing up from a chair using your arms (e.g., wheelchair or bedside chair)?: A Little Help needed to walk in hospital room?: A Little Help needed climbing 3-5 steps with a railing? : A Lot 6 Click Score: 17    End of Session Equipment Utilized During Treatment: Gait belt Activity Tolerance: Patient tolerated treatment well Patient left: in chair;with call bell/phone within reach;with chair alarm set Nurse Communication: Mobility status PT Visit Diagnosis: Muscle weakness (generalized) (M62.81);Difficulty in walking, not elsewhere classified (R26.2);Other abnormalities of gait and mobility (R26.89)    Time: 1610-9604 PT Time Calculation (min) (ACUTE ONLY): 29 min   Charges:   PT Evaluation $PT Eval Moderate Complexity: 1 Mod PT Treatments $Gait Training: 8-22 mins PT General Charges $$  ACUTE PT VISIT: 1 Visit         Tish Forge, DPT Acute Rehabilitation Services Office [phone]  09/05/23 4:39 PM

## 2023-09-05 NOTE — Progress Notes (Signed)
 PROGRESS NOTE    John [name]  HQI:[id number] DOB: 22-Feb-1956 DOA: 09/04/2023 PCP: Patient, No Pcp Per    Brief Narrative:   John [name] is a 68 y.o. male with past medical history significant for chronic diastolic congestive heart failure, HTN, CKD stage IIIa, EtOH cirrhosis with varices and ascites requiring serial paracentesis, EtOH abuse in remission, COPD, renal cell carcinoma s/p cryoablation, hepatitis C, seizure disorder, polysubstance abuse (cocaine/marijuana), anemia, thrombocytopenia, gout, PAD, CVA, obesity who presented to [facility] with progressive abdominal distention and lower extremity edema. Patient was beening seen in follow-up by cardiology outpatient; Dr. Theodis Fiscal; who directed the patient to the ED to seek admission for aggressive management of his symptoms. Patient recently discharged on 08/21/2023 for similar complaint and reports has been non-compliant ("out") of his medications for at least one week.  Patient denies headache, no fever/chills/night sweats, no nausea/vomiting/diarrhea, no abdominal pain, no chest pain, no palpitations, no focal weakness, no cough/congestion, no paresthesias.   In the ED, temperature 97.9 F, HR 99, RR 20, BP 129/103, SpO2 97% on room air.  WBC 4.5, hemoglobin 10.2, platelet count 107.  Sodium 139, potassium 4.5, chloride 105, CO2 23, glucose 86, BUN 13, creat 1.49, lipase 29, AST 28, ALT 7, total bilirubin 1.1.  Ammonia level 25.  BNP 2814.0.  Urinalysis with trace hemoglobin, trace protein otherwise unrevealing.  Chest x-ray with no active cardiopulmonary disease process.  CT abdomen/pelvis without contrast with cirrhosis, moderate ascites, new since prior CT, no bowel obstruction, normal appendix, 15 mm indeterminate left renal upper pole lesion.  Patient received IV Lasix  in the ED.  EDP consulted [facility] for admission and patient was transferred to [facility] for further evaluation and management of decompensated  cirrhosis secondary to medical noncompliance.  Assessment & Plan:   Acute on chronic diastolic congestive heart failure Patient presenting to ED with progressive lower extremity edema, abdominal distention, shortness of breath.  Patient is afebrile without leukocytosis.  Chest x-ray negative for pulmonary edema or focal consolidation.  BNP elevated at 2814.0.  Noncompliant with home Aldactone  and Lasix ; no medications for at least week per patient.  TTE 05/10/2023 with LVEF 55 to 60%, RV systolic function low normal, normal PASP, left atrium moderately dilated, mild mitral regurgitation. -- Furosemide  40 mg IV daily -- 2 g sodium restricted diet; 1200 mL fluid restriction -- Leg elevation, compression stockings -- Strict I's and O's and daily weights -- BMP/BNP daily   EtOH cirrhosis with ascites, decompensated Patient presenting with progressive abdominal distention in the setting of noncompliance with his home furosemide  and spironolactone .  IR consulted for paracentesis underwent on 09/05/2023 with 8.2 L of amber fluid removed. -- Continue Lasix  as above; fluid restriction, low-salt diet -- Hold home spironolactone  for now with aggressive IV diuresis   Left knee pain Patient reports instability with left lower extremity with pain to his knee.  Denies any trauma/falls.  Left knee x-ray unremarkable. -- PT evaluation: Pending   CKD stage IIIa Baseline creatinine 1.8 - 2.0.  Creatinine on admission 1.49, stable -- Cr 1.49>1.75 -- Decrease IV Lasix  to 40 IV every 24 hours -- BMP daily   Essential hypertension Persistent atrial for flutter Rate controlled.  Not on anticoagulation due to history of cirrhosis, esophageal varices and prior GI bleed. -- Metoprolol  succinate 12.5 mg p.o. daily -- Continue monitor on telemetry   COPD Not oxygen  dependent, on room air with adequate SpO2. -- Albuterol  neb every 4 hours as needed wheezing/shortness of  breath   Renal cell carcinoma S/p  cryoablation.  Outpatient follow-up with urology   PAD Hx CVA -- Atorvastatin  40 mg p.o. daily -- Not on antiplatelet due to thrombocytopenia, anemia   Anemia/thrombocytopenia, chronic Hemoglobin 8.8, platelet count 105, stable.   Hx gout -- Allopurinol  100 mg p.o. daily   GERD -- Protonix  80 mg p.o. daily (substituted for home Nexium)   Obesity class II Body mass index is 36.2 kg/m.   DVT prophylaxis: Place TED hose Start: 09/04/23 1655    Code Status: Full Code Family Communication: No family present at bedside this morning  Disposition Plan:  Level of care: Med-Surg Status is: Observation The patient remains OBS appropriate and will d/c before 2 midnights.    Consultants:  Interventional radiology  Procedures:  Paracentesis 5/9, 8.2 L fluid removal  Antimicrobials:  None   Subjective: Patient seen examined bedside, sitting in bedside chair.  Reports poor sleep overnight.  Awaiting paracentesis this morning.  Reports good urine output with IV Lasix .  Discussed anticipate likely discharge home tomorrow and will refill his home medications.  Appreciative of the care he has received.  Discussed need for complete compliance in order to prevent rehospitalization's.  No other specific questions, concerns or complaints at this time.  Denies headache, no dizziness, no chest pain, no palpitations, no shortness of breath, no abdominal pain, no fever/chills/night sweats, no nausea/vomiting/diarrhea.  No acute events overnight per nurse staff.  Objective: Vitals:   09/05/23 0500 09/05/23 0726 09/05/23 1025 09/05/23 1158  BP:  103/76 95/71 106/69  Pulse:  94  91  Resp:  16    Temp:  98 F (36.7 C)    TempSrc:      SpO2:  98%    Weight: 108 kg       Intake/Output Summary (Last 24 hours) at 09/05/2023 1220 Last data filed at 09/05/2023 0816 Gross per 24 hour  Intake 236 ml  Output 1250 ml  Net -1014 ml   Filed Weights   09/04/23 0954 09/05/23 0500  Weight: 108 kg 108  kg    Examination:  Physical Exam: GEN: 67 yo male in NAD, alert and oriented x 3, chronically ill appearance, appears older than stated age HEENT: NCAT, PERRL, EOMI, sclera clear, MMM PULM: CTAB w/o wheezes/crackles, normal respiratory effort, on room air CV: RRR w/o M/G/R GI: abd soft, + abdominal distention, NTTP, NABS, no R/G/M MSK: + LE edema peripheral edema, moves all extremities independently, with preserved muscle strength NEURO: CN II-XII intact, no focal deficits, sensation to light touch intact PSYCH: normal mood/affect Integumentary: + blister wounds to RLE, otherwise skin dry/intact, no rashes or wounds    Data Reviewed: I have personally reviewed following labs and imaging studies  CBC: Recent Labs  Lab 09/04/23 1048 09/05/23 0620  WBC 4.5 4.5  HGB 10.2* 8.8*  HCT 32.4* 27.7*  MCV 80.0 78.5*  PLT 107* 105*   Basic Metabolic Panel: Recent Labs  Lab 09/04/23 1048 09/05/23 0620  NA 139 138  K 4.5 4.9  CL 105 103  CO2 23 26  GLUCOSE 86 86  BUN 13 14  CREATININE 1.49* 1.75*  CALCIUM  9.7 8.9   GFR: Estimated Creatinine Clearance: 49.5 mL/min (A) (by C-G formula based on SCr of 1.75 mg/dL (H)). Liver Function Tests: Recent Labs  Lab 09/04/23 1048 09/05/23 0620  AST 28 20  ALT 7 10  ALKPHOS 89 53  BILITOT 1.1 1.3*  PROT 8.1 6.6  ALBUMIN  4.3 3.0*  Recent Labs  Lab 09/04/23 1048  LIPASE 29   Recent Labs  Lab 09/04/23 1048  AMMONIA 25   Coagulation Profile: Recent Labs  Lab 09/05/23 0620  INR 1.4*   Cardiac Enzymes: No results for input(s): "CKTOTAL", "CKMB", "CKMBINDEX", "TROPONINI" in the last 168 hours. BNP (last 3 results) Recent Labs    11/05/22 1358 11/28/22 0849 09/04/23 1048  PROBNP 3,248* 4,858* 2,814.0*   HbA1C: No results for input(s): "HGBA1C" in the last 72 hours. CBG: No results for input(s): "GLUCAP" in the last 168 hours. Lipid Profile: No results for input(s): "CHOL", "HDL", "LDLCALC", "TRIG", "CHOLHDL",  "LDLDIRECT" in the last 72 hours. Thyroid Function Tests: No results for input(s): "TSH", "T4TOTAL", "FREET4", "T3FREE", "THYROIDAB" in the last 72 hours. Anemia Panel: No results for input(s): "VITAMINB12", "FOLATE", "FERRITIN", "TIBC", "IRON", "RETICCTPCT" in the last 72 hours. Sepsis Labs: No results for input(s): "PROCALCITON", "LATICACIDVEN" in the last 168 hours.  No results found for this or any previous visit (from the past 240 hours).       Radiology Studies: DG Knee 1-2 Views Left Result Date: 09/04/2023 CLINICAL DATA:  Knee pain EXAM: LEFT KNEE - 2 VIEW COMPARISON:  None Available. FINDINGS: Osteopenia. Grossly preserved joint spaces. Hyperostosis along the patella. No obvious fracture dislocation. Question of area of fat along the joint space on lateral view. Please correlate for any possibility of injury and if needed further workup with CT to assess for occult lesion. IMPRESSION: Osteopenia. No obvious fracture seen although there is a question of fat in the joint space. Please correlate with any specific location of pain and further workup is recommended to exclude subtle pathology or injury such as CT. Electronically Signed   By: Adrianna Horde M.D.   On: 09/04/2023 17:36   CT ABDOMEN PELVIS WO CONTRAST Result Date: 09/04/2023 CLINICAL DATA:  Abdominal pain. EXAM: CT ABDOMEN AND PELVIS WITHOUT CONTRAST TECHNIQUE: Multidetector CT imaging of the abdomen and pelvis was performed following the standard protocol without IV contrast. RADIATION DOSE REDUCTION: This exam was performed according to the departmental dose-optimization program which includes automated exposure control, adjustment of the mA and/or kV according to patient size and/or use of iterative reconstruction technique. COMPARISON:  CT and pelvis dated 11/13/2021. FINDINGS: Evaluation of this exam is limited in the absence of intravenous contrast. Lower chest: The visualized lung bases are clear. Moderate ascites, new since  the prior CT. Hepatobiliary: Cirrhosis. No biliary dilatation. No calcified gallstone. Pancreas: Unremarkable. No pancreatic ductal dilatation or surrounding inflammatory changes. Spleen: Splenomegaly measuring 15 cm in length. Adrenals/Urinary Tract: The adrenal glands are unremarkable. Moderate bilateral renal parenchyma atrophy. There is a 15 mm indeterminate high attenuating left renal upper pole lesion which is not characterized on this CT but may represent a hemorrhagic or proteinaceous cyst. Further evaluation with ultrasound or MRI on a nonemergent/outpatient basis recommended. There is no hydronephrosis or nephrolithiasis on either side. The visualized ureters and urinary bladder appear unremarkable. Stomach/Bowel: There is no bowel obstruction. The appendix is normal. Vascular/Lymphatic: Mild aortoiliac atherosclerotic disease. The IVC is unremarkable. No portal venous gas. There is no adenopathy. Reproductive: None Other: Diffuse subcutaneous edema and anasarca. Musculoskeletal: Degenerative changes of the spine. No acute osseous pathology. IMPRESSION: 1. Cirrhosis with moderate ascites, new since the prior CT. 2. No bowel obstruction. Normal appendix. 3. A 15 mm indeterminate left renal upper pole lesion. Further evaluation with ultrasound or MRI on a nonemergent/outpatient basis recommended. 4.  Aortic Atherosclerosis (ICD10-I70.0). Electronically Signed   By: Augie Leaven  Radparvar M.D.   On: 09/04/2023 12:28   DG Chest Portable 1 View Result Date: 09/04/2023 CLINICAL DATA:  Shortness of breath. EXAM: PORTABLE CHEST 1 VIEW COMPARISON:  Chest radiograph dated 08/20/2023 FINDINGS: Shallow inspiration. No focal consolidation, pleural effusion, or pneumothorax. Stable cardiomegaly. No acute osseous pathology. IMPRESSION: 1. No active disease. 2. Cardiomegaly. Electronically Signed   By: Angus Bark M.D.   On: 09/04/2023 11:04        Scheduled Meds:  allopurinol   50 mg Oral Daily   atorvastatin    40 mg Oral Daily   brimonidine   1 drop Both Eyes BID   ferrous sulfate   325 mg Oral Q M,W,F   folic acid   1 mg Oral Daily   [START ON 09/06/2023] furosemide   40 mg Intravenous Daily   guaiFENesin  600 mg Oral BID   metoprolol  succinate  12.5 mg Oral Daily   pantoprazole   80 mg Oral Daily   thiamine   100 mg Oral Daily   Continuous Infusions:   LOS: 0 days    Time spent: 51 minutes spent on 09/05/2023 caring for this patient face-to-face including chart review, ordering labs/tests, documenting, discussion with nursing staff, consultants, updating family and interview/physical exam    Rema Care [place], DO [facility] Available via Epic secure chat 7am-7pm After these hours, please refer to coverage provider listed on [email or web address] 09/05/2023, 12:20 PM

## 2023-09-05 NOTE — Progress Notes (Addendum)
   09/05/23 1246  TOC Brief Assessment  Insurance and Status Reviewed  Patient has primary care physician Yes  Home environment has been reviewed alone  Prior level of function: walks with [employer]  Prior/Current Home Services No current home services  Social Drivers of Health Review SDOH reviewed no interventions necessary  Readmission risk has been reviewed No  Transition of care needs no transition of care needs at this time   Patient lives alone, has [employer] .   Patient states he has a PCP but cannot remember her name, practice name or address. NCM reviewed care every where and did not find name. Patient said not [facility] or [facility]   Patient called his niece Shamekia [phone] no answer and voicemail box full. Patient called her on [phone] and no answer.   Patient stated he had his prescription medications delivered , they were left at his door and "someone took them".   Patient will continue to try to reach his niece for his PCP name    Received a call from [facility] [phone] . PCP is Dr Anthoney Kipper at [facility], updated face sheet. Patient has a home health aide through [facility].   If discharge over weekend [facility] is closed and family will need to transport home. If discharge Monday [facility] can transport home

## 2023-09-05 NOTE — Care Management Obs Status (Signed)
 MEDICARE OBSERVATION STATUS NOTIFICATION   Patient Details  Name: John [name] MRN: [id number] Date of Birth: Sep 25, 1955   Medicare Observation Status Notification Given:  Yes    Felix Host 09/05/2023, 11:11 AM

## 2023-09-05 NOTE — Procedures (Signed)
 PROCEDURE SUMMARY:  Patient with history of CHF, alcoholic cirrhosis with recurrent ascites. Most recent paracenteses: 08/20/23 5.0L out, 07/01/23 with 3.5L out.  Successful image-guided paracentesis from the left lower abdomen.  Yielded 8.2 liters of amber fluid.  No immediate complications.  EBL: zero Patient tolerated well.   Specimen sent for labs.  Please see imaging section of Epic for full dictation.  The patient has previously been formally evaluated by the [facility] interventional [facility] and is being actively followed for potential future intervention.  Tanay Massiah NP 09/05/2023 10:36 AM

## 2023-09-06 ENCOUNTER — Other Ambulatory Visit (HOSPITAL_COMMUNITY): Payer: Self-pay

## 2023-09-06 ENCOUNTER — Encounter (HOSPITAL_COMMUNITY): Payer: Self-pay | Admitting: *Deleted

## 2023-09-06 DIAGNOSIS — K7290 Hepatic failure, unspecified without coma: Secondary | ICD-10-CM | POA: Diagnosis not present

## 2023-09-06 DIAGNOSIS — K7460 Unspecified cirrhosis of liver: Secondary | ICD-10-CM | POA: Diagnosis not present

## 2023-09-06 LAB — BASIC METABOLIC PANEL WITH GFR
Anion gap: 5 (ref 5–15)
BUN: 19 mg/dL (ref 8–23)
CO2: 25 mmol/L (ref 22–32)
Calcium: 8 mg/dL — ABNORMAL LOW (ref 8.9–10.3)
Chloride: 106 mmol/L (ref 98–111)
Creatinine, Ser: 1.95 mg/dL — ABNORMAL HIGH (ref 0.61–1.24)
GFR, Estimated: 37 mL/min — ABNORMAL LOW (ref >60)
Glucose, Bld: 94 mg/dL (ref 70–99)
Potassium: 4.5 mmol/L (ref 3.5–5.1)
Sodium: 136 mmol/L (ref 135–145)

## 2023-09-06 LAB — BRAIN NATRIURETIC PEPTIDE: B Natriuretic Peptide: 250.3 pg/mL — ABNORMAL HIGH (ref 0.0–100.0)

## 2023-09-06 MED ORDER — METOPROLOL SUCCINATE ER 25 MG PO TB24
12.5000 mg | ORAL_TABLET | Freq: Every day | ORAL | 0 refills | Status: DC
  Filled 2023-09-06: qty 45, 90d supply, fill #0

## 2023-09-06 MED ORDER — POTASSIUM CHLORIDE CRYS ER 10 MEQ PO TBCR
10.0000 meq | EXTENDED_RELEASE_TABLET | Freq: Every day | ORAL | 0 refills | Status: DC
  Filled 2023-09-06: qty 90, 90d supply, fill #0

## 2023-09-06 MED ORDER — FLOVENT HFA 220 MCG/ACT IN AERO
2.0000 | INHALATION_SPRAY | Freq: Two times a day (BID) | RESPIRATORY_TRACT | 12 refills | Status: DC
  Filled 2023-09-06: qty 12, 60d supply, fill #0

## 2023-09-06 MED ORDER — THIAMINE HCL 100 MG PO TABS
100.0000 mg | ORAL_TABLET | Freq: Every day | ORAL | 0 refills | Status: DC
  Filled 2023-09-06: qty 30, 30d supply, fill #0

## 2023-09-06 MED ORDER — ALLOPURINOL 100 MG PO TABS
50.0000 mg | ORAL_TABLET | Freq: Every day | ORAL | 0 refills | Status: DC
  Filled 2023-09-06: qty 45, 90d supply, fill #0

## 2023-09-06 MED ORDER — BRIMONIDINE TARTRATE 0.15 % OP SOLN
1.0000 [drp] | Freq: Two times a day (BID) | OPHTHALMIC | 12 refills | Status: DC
Start: 2023-09-06 — End: 2023-12-05
  Filled 2023-09-06: qty 5, 50d supply, fill #0

## 2023-09-06 MED ORDER — FEROSUL 325 (65 FE) MG PO TABS
325.0000 mg | ORAL_TABLET | ORAL | 0 refills | Status: DC
  Filled 2023-09-06: qty 36, 84d supply, fill #0

## 2023-09-06 MED ORDER — ATORVASTATIN CALCIUM 40 MG PO TABS
40.0000 mg | ORAL_TABLET | Freq: Every day | ORAL | 0 refills | Status: DC
  Filled 2023-09-06: qty 90, 90d supply, fill #0

## 2023-09-06 MED ORDER — FUROSEMIDE 40 MG PO TABS
40.0000 mg | ORAL_TABLET | Freq: Every day | ORAL | 0 refills | Status: DC
  Filled 2023-09-06: qty 90, 90d supply, fill #0

## 2023-09-06 MED ORDER — OMEPRAZOLE 40 MG PO CPDR
40.0000 mg | DELAYED_RELEASE_CAPSULE | Freq: Every day | ORAL | 0 refills | Status: DC
  Filled 2023-09-06: qty 90, 90d supply, fill #0

## 2023-09-06 MED ORDER — OXYCODONE HCL 5 MG PO TABS
5.0000 mg | ORAL_TABLET | Freq: Three times a day (TID) | ORAL | 0 refills | Status: DC | PRN
  Filled 2023-09-06: qty 12, 4d supply, fill #0

## 2023-09-06 MED ORDER — LACTULOSE 10 GM/15ML PO SOLN
20.0000 g | Freq: Every day | ORAL | 0 refills | Status: DC | PRN
  Filled 2023-09-06: qty 946, 31d supply, fill #0

## 2023-09-06 MED ORDER — MELATONIN 5 MG PO TABS
5.0000 mg | ORAL_TABLET | Freq: Every day | ORAL | 0 refills | Status: DC
  Filled 2023-09-06: qty 90, 90d supply, fill #0

## 2023-09-06 MED ORDER — FOLIC ACID 1 MG PO TABS
1.0000 mg | ORAL_TABLET | Freq: Every day | ORAL | 0 refills | Status: DC
  Filled 2023-09-06: qty 30, 30d supply, fill #0

## 2023-09-06 MED ORDER — SPIRONOLACTONE 50 MG PO TABS
50.0000 mg | ORAL_TABLET | Freq: Every day | ORAL | 0 refills | Status: DC
  Filled 2023-09-06: qty 90, 90d supply, fill #0

## 2023-09-06 NOTE — Plan of Care (Signed)
  Problem: Clinical Measurements: Goal: Will remain free from infection Outcome: Progressing   Problem: Clinical Measurements: Goal: Respiratory complications will improve Outcome: Progressing   Problem: Clinical Measurements: Goal: Cardiovascular complication will be avoided Outcome: Progressing   Problem: Activity: Goal: Risk for activity intolerance will decrease Outcome: Progressing   Problem: Coping: Goal: Level of anxiety will decrease Outcome: Progressing   Problem: Safety: Goal: Ability to remain free from injury will improve Outcome: Progressing

## 2023-09-06 NOTE — Discharge Summary (Signed)
 Physician Discharge Summary  John [name]:[id number] DOB: 07-01-1955 DOA: 09/04/2023  PCP: John Cure, DO  Admit date: 09/04/2023 Discharge date: 09/06/2023  Admitted From: Home Disposition: Home  Recommendations for Outpatient Follow-up:  Follow up with PCP in 1-2 weeks Follow-up with cardiology May benefit from serial paracentesis, referral to interventional radiology ascites clinic Home medications refilled with 90-day prescription, ensure compliance on outpatient follow-up Please obtain BMP/CBC in one week  Home Health: PT Equipment/Devices: None  Discharge Condition: Stable CODE STATUS: Full code Diet recommendation: Heart healthy/low-salt diet, 1200 mL fluid restriction  History of present illness:  John [name] is a 68 y.o. male with past medical history significant for chronic diastolic congestive heart failure, HTN, CKD stage IIIa, EtOH cirrhosis with varices and ascites requiring serial paracentesis, EtOH abuse in remission, COPD, renal cell carcinoma s/p cryoablation, hepatitis C, seizure disorder, polysubstance abuse (cocaine/marijuana), anemia, thrombocytopenia, gout, PAD, CVA, obesity who presented to [facility] with progressive abdominal distention and lower extremity edema. Patient was beening seen in follow-up by cardiology outpatient; Dr. Theodis Fiscal; who directed the patient to the ED to seek admission for aggressive management of his symptoms. Patient recently discharged on 08/21/2023 for similar complaint and reports has been non-compliant ("out") of his medications for at least one week.  Patient denies headache, no fever/chills/night sweats, no nausea/vomiting/diarrhea, no abdominal pain, no chest pain, no palpitations, no focal weakness, no cough/congestion, no paresthesias.   In the ED, temperature 97.9 F, HR 99, RR 20, BP 129/103, SpO2 97% on room air.  WBC 4.5, hemoglobin 10.2, platelet count 107.  Sodium 139, potassium 4.5, chloride  105, CO2 23, glucose 86, BUN 13, creat 1.49, lipase 29, AST 28, ALT 7, total bilirubin 1.1.  Ammonia level 25.  BNP 2814.0.  Urinalysis with trace hemoglobin, trace protein otherwise unrevealing.  Chest x-ray with no active cardiopulmonary disease process.  CT abdomen/pelvis without contrast with cirrhosis, moderate ascites, new since prior CT, no bowel obstruction, normal appendix, 15 mm indeterminate left renal upper pole lesion.  Patient received IV Lasix  in the ED.  EDP consulted [facility] for admission and patient was transferred to [facility] for further evaluation and management of decompensated cirrhosis secondary to medical noncompliance.  Hospital course:  Acute on chronic diastolic congestive heart failure Patient presenting to ED with progressive lower extremity edema, abdominal distention, shortness of breath.  Patient is afebrile without leukocytosis.  Chest x-ray negative for pulmonary edema or focal consolidation.  BNP elevated at 2814.0.  Noncompliant with home Aldactone  and Lasix ; no medications for at least week per patient.  TTE 05/10/2023 with LVEF 55 to 60%, RV systolic function low normal, normal PASP, left atrium moderately dilated, mild mitral regurgitation.  Patient was started on IV furosemide  twice daily for aggressive IV diuresis.  Will resume home furosemide  and spironolactone  on discharge.  Discussed need for low-salt diet, 1200 mL fluid restriction.  Leg elevation/compression stockings.  Outpatient follow-up with PCP.   EtOH cirrhosis with ascites, decompensated Patient presenting with progressive abdominal distention in the setting of noncompliance with his home furosemide  and spironolactone .  IR consulted for paracentesis underwent on 09/05/2023 with 8.2 L of amber fluid removed.  Continue home furosemide , spironolactone .  1200 mL fluid restriction.  May benefit from outpatient IR follow-up for serial paracentesis outpatient.   Left knee pain Patient reports instability  with left lower extremity with pain to his knee.  Denies any trauma/falls.  Left knee x-ray unremarkable.  Seen by physical therapy with recommendation  of home health on discharge.   CKD stage IIIa Baseline creatinine 1.8 - 2.0.  Repeat BMP 1 week.   Essential hypertension Persistent atrial for flutter Rate controlled.  Not on anticoagulation due to history of cirrhosis, esophageal varices and prior GI bleed.  Metoprolol  succinate 12.5 mg p.o. daily  COPD Not oxygen  dependent, on room air with adequate SpO2.   Renal cell carcinoma S/p cryoablation.  Outpatient follow-up with urology   PAD Hx CVA Atorvastatin  40 mg p.o. daily. Not on antiplatelet due to thrombocytopenia, anemia   Anemia/thrombocytopenia, chronic Hemoglobin 8.8, platelet count 105, stable.   Hx gout Allopurinol     GERD Nexium   Obesity class II Body mass index is 36.2 kg/m.  Discharge Diagnoses:  Principal Problem:   Decompensated cirrhosis [facility])    Discharge Instructions  Discharge Instructions     Ambulatory referral to Interventional Radiology   Complete by: As directed    Paracentesis clinic   Call MD for:  difficulty breathing, headache or visual disturbances   Complete by: As directed    Call MD for:  extreme fatigue   Complete by: As directed    Call MD for:  persistant dizziness or light-headedness   Complete by: As directed    Call MD for:  persistant nausea and vomiting   Complete by: As directed    Call MD for:  severe uncontrolled pain   Complete by: As directed    Call MD for:  temperature >100.4   Complete by: As directed    Diet - low sodium heart healthy   Complete by: As directed    Increase activity slowly   Complete by: As directed    No wound care   Complete by: As directed       Allergies as of 09/06/2023       Reactions   Penicillins Other (See Comments)   Convulsions, Patient could not walk, childhood allergy         Medication List     STOP taking  these medications    polyethylene glycol 17 g packet Commonly known as: MIRALAX  / GLYCOLAX        TAKE these medications    acetaminophen  325 MG tablet Commonly known as: TYLENOL  Take 650 mg by mouth every 8 (eight) hours as needed for mild pain (pain score 1-3) or moderate pain (pain score 4-6). Do not exceed 6 tabs per day.   allopurinol  100 MG tablet Commonly known as: ZYLOPRIM  Take 0.5 tablets (50 mg total) by mouth daily.   atorvastatin  40 MG tablet Commonly known as: LIPITOR Take 1 tablet (40 mg total) by mouth daily.   brimonidine  0.15 % ophthalmic solution Commonly known as: ALPHAGAN  Place 1 drop into both eyes 2 (two) times daily.   feeding supplement Liqd Take 237 mLs by mouth 2 (two) times daily between meals.   FeroSul 325 (65 Fe) MG tablet Generic drug: ferrous sulfate  Take 1 tablet (325 mg total) by mouth every Monday, Wednesday, and Friday. Start taking on: Sep 08, 2023   Flovent  HFA 220 MCG/ACT inhaler Generic drug: fluticasone  Inhale 2 puffs into the lungs in the morning and at bedtime.   fluticasone  50 MCG/ACT nasal spray Commonly known as: FLONASE  Place 1 spray into both nostrils as needed.   folic acid  1 MG tablet Commonly known as: FOLVITE  Take 1 tablet (1 mg total) by mouth daily.   furosemide  40 MG tablet Commonly known as: LASIX  Take 1 tablet (40 mg total) by mouth daily.  Start taking on: Sep 07, 2023   hydroxypropyl methylcellulose / hypromellose 2.5 % ophthalmic solution Commonly known as: ISOPTO TEARS / GONIOVISC Place 1 drop into the right eye as needed for dry eyes.   ketoconazole  2 % shampoo Commonly known as: NIZORAL  Apply 1 Application topically See admin instructions. Shampoo the scalp 2 times a week   lactulose  10 GM/15ML solution Commonly known as: CHRONULAC  Take 30 mLs (20 g total) by mouth daily as needed for mild constipation.   melatonin 5 MG Tabs Take 1 tablet (5 mg total) by mouth at bedtime.   metoprolol   succinate 25 MG 24 hr tablet Commonly known as: Toprol  XL Take 0.5 tablets (12.5 mg total) by mouth daily.   omeprazole  40 MG capsule Commonly known as: PRILOSEC Take 1 capsule (40 mg total) by mouth daily before breakfast.   oxyCODONE  5 MG immediate release tablet Commonly known as: Oxy IR/ROXICODONE  Take 1 tablet (5 mg total) by mouth every 8 (eight) hours as needed for severe pain (pain score 7-10). What changed:  when to take this reasons to take this   potassium chloride  10 MEQ tablet Commonly known as: KLOR-CON  Take 1 tablet (10 mEq total) by mouth daily.   ProAir  HFA 108 (90 Base) MCG/ACT inhaler Generic drug: albuterol  Inhale 2 puffs into the lungs every 6 (six) hours as needed for wheezing or shortness of breath.   Salonpas Pain Relief Patch Ptch Apply 1 patch topically See admin instructions. Apply one patch to the mid-back & one to the lower back in the morning and remove approx 12 hours later   spironolactone  50 MG tablet Commonly known as: ALDACTONE  Take 1 tablet (50 mg total) by mouth daily. Start taking on: Sep 07, 2023   terbinafine 1 % cream Commonly known as: LAMISIL Apply 1 Application topically See admin instructions. Apply to both feet 2 times a day as directed   thiamine  100 MG tablet Commonly known as: Vitamin B-1 Take 1 tablet (100 mg total) by mouth daily.        Follow-up Information     Reed, Tiffany L, DO. Schedule an appointment as soon as possible for a visit in 1 week(s).   Specialty: Geriatric Medicine Contact information: [address] [place] [address] [phone]         Maudine Sos, MD. Schedule an appointment as soon as possible for a visit.   Specialty: Cardiology Contact information: [address] [place] [address] [phone]                Allergies  Allergen Reactions   Penicillins Other (See Comments)    Convulsions, Patient could not walk, childhood allergy      Consultations: Interventional radiology   Procedures/Studies: IR Paracentesis Result Date: 09/05/2023 INDICATION: Patient with history of CHF, alcoholic cirrhosis with recurrent ascites. Most recent paracenteses 08/20/23 5.0 L out, 07/01/23 with 3.5 L out. EXAM: ULTRASOUND GUIDED DIAGNOSTIC AND THERAPEUTIC PARACENTESIS MEDICATIONS: 9 mL 1% lidocaine  COMPLICATIONS: None immediate. PROCEDURE: Informed written consent was obtained from the patient after a discussion of the risks, benefits and alternatives to treatment. A timeout was performed prior to the initiation of the procedure. Initial ultrasound scanning demonstrates a large amount of ascites within the left lower abdominal quadrant. The left lower abdomen was prepped and draped in the usual sterile fashion. 1% lidocaine  was used for local anesthesia. Following this, a 19 gauge, 7-cm, Yueh catheter was introduced. An ultrasound image was saved for documentation purposes. The paracentesis was  performed. The catheter was removed and a dressing was applied. The patient tolerated the procedure well without immediate post procedural complication. Patient received post-procedure intravenous albumin ; see nursing notes for details. FINDINGS: A total of approximately 8.2 L of amber fluid was removed. Samples were sent to the laboratory as requested by the clinical team. IMPRESSION: Successful ultrasound-guided paracentesis yielding 8.2 liters of peritoneal fluid. PLAN: The patient has previously been formally evaluated by the [facility] interventional Radiology Portal Hypertension Clinic and is being actively followed for potential future intervention. Performed by Terressa Fess, NP Electronically Signed   By: Fernando Hoyer M.D.   On: 09/05/2023 13:48   DG Knee 1-2 Views Left Result Date: 09/04/2023 CLINICAL DATA:  Knee pain EXAM: LEFT KNEE - 2 VIEW COMPARISON:  None Available. FINDINGS: Osteopenia. Grossly preserved joint spaces. Hyperostosis along  the patella. No obvious fracture dislocation. Question of area of fat along the joint space on lateral view. Please correlate for any possibility of injury and if needed further workup with CT to assess for occult lesion. IMPRESSION: Osteopenia. No obvious fracture seen although there is a question of fat in the joint space. Please correlate with any specific location of pain and further workup is recommended to exclude subtle pathology or injury such as CT. Electronically Signed   By: Adrianna Horde M.D.   On: 09/04/2023 17:36   CT ABDOMEN PELVIS WO CONTRAST Result Date: 09/04/2023 CLINICAL DATA:  Abdominal pain. EXAM: CT ABDOMEN AND PELVIS WITHOUT CONTRAST TECHNIQUE: Multidetector CT imaging of the abdomen and pelvis was performed following the standard protocol without IV contrast. RADIATION DOSE REDUCTION: This exam was performed according to the departmental dose-optimization program which includes automated exposure control, adjustment of the mA and/or kV according to patient size and/or use of iterative reconstruction technique. COMPARISON:  CT and pelvis dated 11/13/2021. FINDINGS: Evaluation of this exam is limited in the absence of intravenous contrast. Lower chest: The visualized lung bases are clear. Moderate ascites, new since the prior CT. Hepatobiliary: Cirrhosis. No biliary dilatation. No calcified gallstone. Pancreas: Unremarkable. No pancreatic ductal dilatation or surrounding inflammatory changes. Spleen: Splenomegaly measuring 15 cm in length. Adrenals/Urinary Tract: The adrenal glands are unremarkable. Moderate bilateral renal parenchyma atrophy. There is a 15 mm indeterminate high attenuating left renal upper pole lesion which is not characterized on this CT but may represent a hemorrhagic or proteinaceous cyst. Further evaluation with ultrasound or MRI on a nonemergent/outpatient basis recommended. There is no hydronephrosis or nephrolithiasis on either side. The visualized ureters and urinary  bladder appear unremarkable. Stomach/Bowel: There is no bowel obstruction. The appendix is normal. Vascular/Lymphatic: Mild aortoiliac atherosclerotic disease. The IVC is unremarkable. No portal venous gas. There is no adenopathy. Reproductive: None Other: Diffuse subcutaneous edema and anasarca. Musculoskeletal: Degenerative changes of the spine. No acute osseous pathology. IMPRESSION: 1. Cirrhosis with moderate ascites, new since the prior CT. 2. No bowel obstruction. Normal appendix. 3. A 15 mm indeterminate left renal upper pole lesion. Further evaluation with ultrasound or MRI on a nonemergent/outpatient basis recommended. 4.  Aortic Atherosclerosis (ICD10-I70.0). Electronically Signed   By: Angus Bark M.D.   On: 09/04/2023 12:28   DG Chest Portable 1 View Result Date: 09/04/2023 CLINICAL DATA:  Shortness of breath. EXAM: PORTABLE CHEST 1 VIEW COMPARISON:  Chest radiograph dated 08/20/2023 FINDINGS: Shallow inspiration. No focal consolidation, pleural effusion, or pneumothorax. Stable cardiomegaly. No acute osseous pathology. IMPRESSION: 1. No active disease. 2. Cardiomegaly. Electronically Signed   By: Angus Bark M.D.   On:  09/04/2023 11:04   US  Paracentesis Result Date: 08/20/2023 INDICATION: Patient with history of CHF, ETOH cirrhosis, recurrent ascites who presented to the ED due to abdominal distention and dyspnea. Request to IR for therapeutic paracentesis with 5 L maximum. EXAM: ULTRASOUND GUIDED THERAPEUTIC PARACENTESIS MEDICATIONS: 7 mL 1% lidocaine  COMPLICATIONS: None immediate. PROCEDURE: Informed written consent was obtained from the patient after a discussion of the risks, benefits and alternatives to treatment. A timeout was performed prior to the initiation of the procedure. Initial ultrasound scanning demonstrates a large amount of ascites within the right lower abdominal quadrant. The right lower abdomen was prepped and draped in the usual sterile fashion. 1% lidocaine  was used  for local anesthesia. Following this, a 19 gauge, 7-cm, Yueh catheter was introduced. An ultrasound image was saved for documentation purposes. The paracentesis was performed. The catheter was removed and a dressing was applied. The patient tolerated the procedure well without immediate post procedural complication. FINDINGS: A total of approximately 5.0 L of clear yellow fluid was removed. IMPRESSION: Successful ultrasound-guided paracentesis yielding 5.0 liters of peritoneal fluid. Performed by Nathan Bake, PA-C PLAN: The patient has previously been formally evaluated by the [facility] Interventional Radiology Portal Hypertension Clinic and is being actively followed for potential future intervention. Electronically Signed   By: Myrlene Asper D.O.   On: 08/20/2023 17:02   DG Chest Port 1 View Result Date: 08/20/2023 CLINICAL DATA:  Shortness of breath EXAM: PORTABLE CHEST 1 VIEW COMPARISON:  June 30, 2023 FINDINGS: Mild bilateral reticular interstitial prominence, subtle congestive changes without consolidations?. Small left pleural reaction or effusion with left lower lobe atelectasis Heart normal size IMPRESSION: Mild bilateral reticular interstitial prominence, subtle congestive changes without consolidations? Small left pleural reaction or effusion with left lower lobe. Electronically Signed   By: Fredrich Jefferson M.D.   On: 08/20/2023 11:00     Subjective:   Discharge Exam: Vitals:   09/06/23 0649 09/06/23 0824  BP: 91/80 105/75  Pulse: 93 94  Resp: 18 18  Temp: 98.3 F (36.8 C) 97.8 F (36.6 C)  SpO2: 98% 100%   Vitals:   09/05/23 2134 09/06/23 0649 09/06/23 0800 09/06/23 0824  BP: 92/66 91/80  105/75  Pulse: 86 93  94  Resp: 18 18  18   Temp: 98.3 F (36.8 C) 98.3 F (36.8 C)  97.8 F (36.6 C)  TempSrc: Oral Oral  Oral  SpO2: 100% 98%  100%  Weight:   107.7 kg   Height:   5\' 8"  (1.727 m)     Physical Exam: GEN: NAD, alert and oriented x 3, chronically ill appearance,  obese, appears older than stated age HEENT: NCAT, PERRL, EOMI, sclera clear, MMM PULM: CTAB w/o wheezes/crackles, normal respiratory effort, on room air CV: RRR w/o M/G/R GI: abd soft, NTND, NABS, no R/G/M MSK: + LE peripheral edema, muscle strength globally intact 5/5 bilateral upper/lower extremities NEURO: CN II-XII intact, no focal deficits, sensation to light touch intact PSYCH: normal mood/affect Integumentary: Noted clear blistering wounds due to edema right lower extremity, otherwise no other concerning rashes/lesions/wounds noted on exposed skin surfaces    The results of significant diagnostics from this hospitalization (including imaging, microbiology, ancillary and laboratory) are listed below for reference.     Microbiology: Recent Results (from the past 240 hours)  Body fluid culture w Gram Stain     Status: None (Preliminary result)   Collection Time: 09/05/23 10:52 AM   Specimen: Abdomen; Peritoneal Fluid  Result Value Ref Range Status  Specimen Description PERITONEAL  Final   Special Requests NONE  Final   Gram Stain   Final    NO WBC SEEN NO ORGANISMS SEEN Performed at [facility], [address], [place], [place] [address]    Culture PENDING  Incomplete   Report Status PENDING  Incomplete     Labs: BNP (last 3 results) Recent Labs    08/20/23 1103 09/05/23 0620 09/06/23 0406  BNP 172.1* 297.1* 250.3*   Basic Metabolic Panel: Recent Labs  Lab 09/04/23 1048 09/05/23 0620 09/06/23 0406  NA 139 138 136  K 4.5 4.9 4.5  CL 105 103 106  CO2 23 26 25   GLUCOSE 86 86 94  BUN 13 14 19   CREATININE 1.49* 1.75* 1.95*  CALCIUM  9.7 8.9 8.0*   Liver Function Tests: Recent Labs  Lab 09/04/23 1048 09/05/23 0620  AST 28 20  ALT 7 10  ALKPHOS 89 53  BILITOT 1.1 1.3*  PROT 8.1 6.6  ALBUMIN  4.3 3.0*   Recent Labs  Lab 09/04/23 1048  LIPASE 29   Recent Labs  Lab 09/04/23 1048  AMMONIA 25   CBC: Recent Labs  Lab 09/04/23 1048  09/05/23 0620  WBC 4.5 4.5  HGB 10.2* 8.8*  HCT 32.4* 27.7*  MCV 80.0 78.5*  PLT 107* 105*   Cardiac Enzymes: No results for input(s): "CKTOTAL", "CKMB", "CKMBINDEX", "TROPONINI" in the last 168 hours. BNP: Invalid input(s): "POCBNP" CBG: No results for input(s): "GLUCAP" in the last 168 hours. D-Dimer No results for input(s): "DDIMER" in the last 72 hours. Hgb A1c No results for input(s): "HGBA1C" in the last 72 hours. Lipid Profile No results for input(s): "CHOL", "HDL", "LDLCALC", "TRIG", "CHOLHDL", "LDLDIRECT" in the last 72 hours. Thyroid function studies No results for input(s): "TSH", "T4TOTAL", "T3FREE", "THYROIDAB" in the last 72 hours.  Invalid input(s): "FREET3" Anemia work up No results for input(s): "VITAMINB12", "FOLATE", "FERRITIN", "TIBC", "IRON", "RETICCTPCT" in the last 72 hours. Urinalysis    Component Value Date/Time   COLORURINE YELLOW 09/04/2023 1111   APPEARANCEUR CLEAR 09/04/2023 1111   LABSPEC 1.016 09/04/2023 1111   PHURINE 5.5 09/04/2023 1111   GLUCOSEU NEGATIVE 09/04/2023 1111   HGBUR TRACE (A) 09/04/2023 1111   BILIRUBINUR NEGATIVE 09/04/2023 1111   KETONESUR NEGATIVE 09/04/2023 1111   PROTEINUR TRACE (A) 09/04/2023 1111   UROBILINOGEN 1.0 10/31/2014 2127   NITRITE NEGATIVE 09/04/2023 1111   LEUKOCYTESUR NEGATIVE 09/04/2023 1111   Sepsis Labs Recent Labs  Lab 09/04/23 1048 09/05/23 0620  WBC 4.5 4.5   Microbiology Recent Results (from the past 240 hours)  Body fluid culture w Gram Stain     Status: None (Preliminary result)   Collection Time: 09/05/23 10:52 AM   Specimen: Abdomen; Peritoneal Fluid  Result Value Ref Range Status   Specimen Description PERITONEAL  Final   Special Requests NONE  Final   Gram Stain   Final    NO WBC SEEN NO ORGANISMS SEEN Performed at [facility], [address], [place], [place] [address]    Culture PENDING  Incomplete   Report Status PENDING  Incomplete     Time coordinating  discharge: Over 30 minutes  SIGNED:   Rema Care [place], DO  [facility] 09/06/2023, 9:25 AM

## 2023-09-06 NOTE — Plan of Care (Signed)

## 2023-09-08 LAB — BODY FLUID CULTURE W GRAM STAIN: Gram Stain: NONE SEEN

## 2023-09-08 LAB — CYTOLOGY - NON PAP

## 2023-09-11 ENCOUNTER — Encounter: Payer: Self-pay | Admitting: Family Medicine

## 2023-09-16 ENCOUNTER — Ambulatory Visit (HOSPITAL_COMMUNITY)
Admission: RE | Admit: 2023-09-16 | Discharge: 2023-09-16 | Disposition: A | Discharge status: Home / Self Care | Source: Ambulatory Visit | Attending: Physician Assistant | Admitting: Physician Assistant

## 2023-09-16 DIAGNOSIS — R188 Other ascites: Secondary | ICD-10-CM | POA: Diagnosis not present

## 2023-09-16 DIAGNOSIS — K7290 Hepatic failure, unspecified without coma: Secondary | ICD-10-CM | POA: Diagnosis present

## 2023-09-16 DIAGNOSIS — Z860100 Personal history of colon polyps, unspecified: Secondary | ICD-10-CM | POA: Insufficient documentation

## 2023-09-16 DIAGNOSIS — K7460 Unspecified cirrhosis of liver: Secondary | ICD-10-CM | POA: Diagnosis not present

## 2023-09-16 HISTORY — PX: IR PARACENTESIS: IMG2679

## 2023-09-16 MED ORDER — LIDOCAINE HCL 1 % IJ SOLN
INTRAMUSCULAR | Status: AC
  Filled 2023-09-16: qty 20

## 2023-09-16 MED ORDER — LIDOCAINE HCL 1 % IJ SOLN
20.0000 mL | Freq: Once | INTRAMUSCULAR | Status: AC
  Administered 2023-09-16: 10 mL via INTRADERMAL

## 2023-09-16 NOTE — Procedures (Signed)
 PROCEDURE SUMMARY:  Successful ultrasound guided paracentesis from the left lower quadrant.  Yielded 5 liters of straw fluid.  No immediate complications.  The patient tolerated the procedure well.    EBL < 2 mL  The patient has previously been formally evaluated by the [facility] Interventional Radiology Portal Hypertension Clinic and is being actively followed for potential future intervention.

## 2023-10-15 ENCOUNTER — Encounter (HOSPITAL_COMMUNITY): Payer: Self-pay

## 2023-10-15 ENCOUNTER — Encounter (HOSPITAL_COMMUNITY): Payer: Self-pay | Admitting: *Deleted

## 2023-10-15 ENCOUNTER — Other Ambulatory Visit (HOSPITAL_COMMUNITY): Payer: Self-pay

## 2023-10-15 ENCOUNTER — Other Ambulatory Visit (HOSPITAL_BASED_OUTPATIENT_CLINIC_OR_DEPARTMENT_OTHER): Payer: Self-pay

## 2023-10-15 ENCOUNTER — Emergency Department (HOSPITAL_COMMUNITY): Payer: Medicare (Managed Care)

## 2023-10-15 ENCOUNTER — Inpatient Hospital Stay (HOSPITAL_COMMUNITY)
Admission: EM | Admit: 2023-10-15 | Discharge: 2023-10-17 | DRG: 291 | Disposition: A | Discharge status: Home / Self Care | Payer: Medicare (Managed Care) | Attending: Internal Medicine | Admitting: Internal Medicine

## 2023-10-15 ENCOUNTER — Other Ambulatory Visit: Payer: Self-pay

## 2023-10-15 DIAGNOSIS — Z8619 Personal history of other infectious and parasitic diseases: Secondary | ICD-10-CM

## 2023-10-15 DIAGNOSIS — I130 Hypertensive heart and chronic kidney disease with heart failure and stage 1 through stage 4 chronic kidney disease, or unspecified chronic kidney disease: Principal | ICD-10-CM | POA: Diagnosis present

## 2023-10-15 DIAGNOSIS — D509 Iron deficiency anemia, unspecified: Secondary | ICD-10-CM | POA: Diagnosis present

## 2023-10-15 DIAGNOSIS — Z85528 Personal history of other malignant neoplasm of kidney: Secondary | ICD-10-CM

## 2023-10-15 DIAGNOSIS — D696 Thrombocytopenia, unspecified: Secondary | ICD-10-CM | POA: Diagnosis present

## 2023-10-15 DIAGNOSIS — M109 Gout, unspecified: Secondary | ICD-10-CM | POA: Diagnosis present

## 2023-10-15 DIAGNOSIS — Z8249 Family history of ischemic heart disease and other diseases of the circulatory system: Secondary | ICD-10-CM

## 2023-10-15 DIAGNOSIS — Z7951 Long term (current) use of inhaled steroids: Secondary | ICD-10-CM

## 2023-10-15 DIAGNOSIS — I2510 Atherosclerotic heart disease of native coronary artery without angina pectoris: Secondary | ICD-10-CM | POA: Diagnosis present

## 2023-10-15 DIAGNOSIS — Z79899 Other long term (current) drug therapy: Secondary | ICD-10-CM

## 2023-10-15 DIAGNOSIS — Z8673 Personal history of transient ischemic attack (TIA), and cerebral infarction without residual deficits: Secondary | ICD-10-CM

## 2023-10-15 DIAGNOSIS — K7031 Alcoholic cirrhosis of liver with ascites: Secondary | ICD-10-CM | POA: Diagnosis not present

## 2023-10-15 DIAGNOSIS — H469 Unspecified optic neuritis: Secondary | ICD-10-CM | POA: Diagnosis present

## 2023-10-15 DIAGNOSIS — J4489 Other specified chronic obstructive pulmonary disease: Secondary | ICD-10-CM | POA: Diagnosis present

## 2023-10-15 DIAGNOSIS — I5033 Acute on chronic diastolic (congestive) heart failure: Secondary | ICD-10-CM | POA: Diagnosis not present

## 2023-10-15 DIAGNOSIS — Z880 Allergy status to penicillin: Secondary | ICD-10-CM

## 2023-10-15 DIAGNOSIS — I4892 Unspecified atrial flutter: Secondary | ICD-10-CM | POA: Diagnosis present

## 2023-10-15 DIAGNOSIS — Z800 Family history of malignant neoplasm of digestive organs: Secondary | ICD-10-CM

## 2023-10-15 DIAGNOSIS — Z841 Family history of disorders of kidney and ureter: Secondary | ICD-10-CM

## 2023-10-15 DIAGNOSIS — I739 Peripheral vascular disease, unspecified: Secondary | ICD-10-CM | POA: Diagnosis present

## 2023-10-15 DIAGNOSIS — F1011 Alcohol abuse, in remission: Secondary | ICD-10-CM | POA: Diagnosis present

## 2023-10-15 DIAGNOSIS — G40909 Epilepsy, unspecified, not intractable, without status epilepticus: Secondary | ICD-10-CM | POA: Diagnosis present

## 2023-10-15 DIAGNOSIS — Z833 Family history of diabetes mellitus: Secondary | ICD-10-CM

## 2023-10-15 DIAGNOSIS — R600 Localized edema: Secondary | ICD-10-CM | POA: Diagnosis present

## 2023-10-15 DIAGNOSIS — N1832 Chronic kidney disease, stage 3b: Secondary | ICD-10-CM | POA: Diagnosis present

## 2023-10-15 DIAGNOSIS — Z87891 Personal history of nicotine dependence: Secondary | ICD-10-CM

## 2023-10-15 DIAGNOSIS — K219 Gastro-esophageal reflux disease without esophagitis: Secondary | ICD-10-CM | POA: Diagnosis present

## 2023-10-15 DIAGNOSIS — I4891 Unspecified atrial fibrillation: Secondary | ICD-10-CM | POA: Diagnosis present

## 2023-10-15 DIAGNOSIS — E785 Hyperlipidemia, unspecified: Secondary | ICD-10-CM | POA: Diagnosis present

## 2023-10-15 DIAGNOSIS — Z981 Arthrodesis status: Secondary | ICD-10-CM

## 2023-10-15 DIAGNOSIS — Z6838 Body mass index (BMI) 38.0-38.9, adult: Secondary | ICD-10-CM

## 2023-10-15 DIAGNOSIS — E66812 Obesity, class 2: Secondary | ICD-10-CM | POA: Diagnosis present

## 2023-10-15 DIAGNOSIS — D573 Sickle-cell trait: Secondary | ICD-10-CM | POA: Diagnosis present

## 2023-10-15 DIAGNOSIS — I509 Heart failure, unspecified: Principal | ICD-10-CM

## 2023-10-15 LAB — CBC WITH DIFFERENTIAL/PLATELET
Abs Immature Granulocytes: 0.02 10*3/uL (ref 0.00–0.07)
Basophils Absolute: 0 10*3/uL (ref 0.0–0.1)
Basophils Relative: 0 %
Eosinophils Absolute: 0.1 10*3/uL (ref 0.0–0.5)
Eosinophils Relative: 2 %
HCT: 31.6 % — ABNORMAL LOW (ref 39.0–52.0)
Hemoglobin: 9.6 g/dL — ABNORMAL LOW (ref 13.0–17.0)
Immature Granulocytes: 0 %
Lymphocytes Relative: 8 %
Lymphs Abs: 0.4 10*3/uL — ABNORMAL LOW (ref 0.7–4.0)
MCH: 24.9 pg — ABNORMAL LOW (ref 26.0–34.0)
MCHC: 30.4 g/dL (ref 30.0–36.0)
MCV: 82.1 fL (ref 80.0–100.0)
Monocytes Absolute: 0.7 10*3/uL (ref 0.1–1.0)
Monocytes Relative: 16 %
Neutro Abs: 3.3 10*3/uL (ref 1.7–7.7)
Neutrophils Relative %: 74 %
Platelets: 117 10*3/uL — ABNORMAL LOW (ref 150–400)
RBC: 3.85 MIL/uL — ABNORMAL LOW (ref 4.22–5.81)
RDW: 17.7 % — ABNORMAL HIGH (ref 11.5–15.5)
WBC: 4.6 10*3/uL (ref 4.0–10.5)
nRBC: 0 % (ref 0.0–0.2)

## 2023-10-15 LAB — ALBUMIN, PLEURAL OR PERITONEAL FLUID: Albumin, Fluid: <1.5 g/dL

## 2023-10-15 LAB — COMPREHENSIVE METABOLIC PANEL WITH GFR
ALT: 9 U/L (ref 0–44)
AST: 19 U/L (ref 15–41)
Albumin: 2.9 g/dL — ABNORMAL LOW (ref 3.5–5.0)
Alkaline Phosphatase: 79 U/L (ref 38–126)
Anion gap: 7 (ref 5–15)
BUN: 17 mg/dL (ref 8–23)
CO2: 25 mmol/L (ref 22–32)
Calcium: 8.3 mg/dL — ABNORMAL LOW (ref 8.9–10.3)
Chloride: 103 mmol/L (ref 98–111)
Creatinine, Ser: 1.84 mg/dL — ABNORMAL HIGH (ref 0.61–1.24)
GFR, Estimated: 40 mL/min — ABNORMAL LOW (ref >60)
Glucose, Bld: 84 mg/dL (ref 70–99)
Potassium: 5 mmol/L (ref 3.5–5.1)
Sodium: 135 mmol/L (ref 135–145)
Total Bilirubin: 1 mg/dL (ref 0.0–1.2)
Total Protein: 7.4 g/dL (ref 6.5–8.1)

## 2023-10-15 LAB — BODY FLUID CELL COUNT WITH DIFFERENTIAL
Eos, Fluid: 0 %
Lymphs, Fluid: 18 %
Monocyte-Macrophage-Serous Fluid: 48 % — ABNORMAL LOW (ref 50–90)
Neutrophil Count, Fluid: 34 % — ABNORMAL HIGH (ref 0–25)
Total Nucleated Cell Count, Fluid: 291 uL (ref 0–1000)

## 2023-10-15 LAB — PROTEIN, PLEURAL OR PERITONEAL FLUID: Total protein, fluid: <3 g/dL

## 2023-10-15 LAB — LACTATE DEHYDROGENASE, PLEURAL OR PERITONEAL FLUID: LD, Fluid: 61 U/L — ABNORMAL HIGH (ref 3–23)

## 2023-10-15 LAB — TROPONIN I (HIGH SENSITIVITY): Troponin I (High Sensitivity): 9 ng/L (ref <18)

## 2023-10-15 LAB — GLUCOSE, PLEURAL OR PERITONEAL FLUID: Glucose, Fluid: 103 mg/dL

## 2023-10-15 LAB — BRAIN NATRIURETIC PEPTIDE: B Natriuretic Peptide: 273 pg/mL — ABNORMAL HIGH (ref 0.0–100.0)

## 2023-10-15 MED ORDER — FLUTICASONE PROPIONATE 50 MCG/ACT NA SUSP: 1.0000 | NASAL | Status: DC | PRN

## 2023-10-15 MED ORDER — LIDOCAINE 5 % EX PTCH
2.0000 | MEDICATED_PATCH | CUTANEOUS | Status: DC
  Administered 2023-10-15 – 2023-10-16 (×2): 2 via TRANSDERMAL
  Filled 2023-10-15 (×2): qty 2

## 2023-10-15 MED ORDER — SPIRONOLACTONE 50 MG PO TABS
50.0000 mg | ORAL_TABLET | Freq: Every day | ORAL | 0 refills | Status: DC
  Filled 2023-10-15: qty 90, 90d supply, fill #0

## 2023-10-15 MED ORDER — METOPROLOL SUCCINATE ER 25 MG PO TB24
12.5000 mg | ORAL_TABLET | Freq: Every day | ORAL | 0 refills | Status: DC
  Filled 2023-10-15: qty 45, 90d supply, fill #0

## 2023-10-15 MED ORDER — VITAMIN B-1 100 MG PO TABS
100.0000 mg | ORAL_TABLET | Freq: Every day | ORAL | 0 refills | Status: AC
  Filled 2023-10-15 (×2): qty 30, 30d supply, fill #0

## 2023-10-15 MED ORDER — ENSURE PLUS HIGH PROTEIN PO LIQD
237.0000 mL | Freq: Two times a day (BID) | ORAL | Status: DC
  Administered 2023-10-16 – 2023-10-17 (×2): 237 mL via ORAL
  Filled 2023-10-15: qty 237

## 2023-10-15 MED ORDER — THIAMINE MONONITRATE 100 MG PO TABS
100.0000 mg | ORAL_TABLET | Freq: Every day | ORAL | Status: DC
  Administered 2023-10-16 – 2023-10-17 (×2): 100 mg via ORAL
  Filled 2023-10-15 (×3): qty 1

## 2023-10-15 MED ORDER — LACTULOSE 10 GM/15ML PO SOLN
20.0000 g | Freq: Every day | ORAL | 0 refills | Status: DC | PRN
  Filled 2023-10-15: qty 237, 7d supply, fill #0
  Filled 2023-10-15: qty 946, 31d supply, fill #0

## 2023-10-15 MED ORDER — FLUTICASONE PROPIONATE HFA 220 MCG/ACT IN AERO: 2.0000 | INHALATION_SPRAY | Freq: Two times a day (BID) | RESPIRATORY_TRACT | Status: DC

## 2023-10-15 MED ORDER — FOLIC ACID 1 MG PO TABS
1.0000 mg | ORAL_TABLET | Freq: Every day | ORAL | 0 refills | Status: DC
Start: 2023-10-15 — End: 2023-12-05
  Filled 2023-10-15: qty 90, 90d supply, fill #0

## 2023-10-15 MED ORDER — OMEPRAZOLE 40 MG PO CPDR
40.0000 mg | DELAYED_RELEASE_CAPSULE | Freq: Every day | ORAL | 0 refills | Status: DC
  Filled 2023-10-15: qty 90, 90d supply, fill #0

## 2023-10-15 MED ORDER — FUROSEMIDE 40 MG PO TABS
40.0000 mg | ORAL_TABLET | Freq: Every day | ORAL | 0 refills | Status: DC
  Filled 2023-10-15: qty 90, 90d supply, fill #0

## 2023-10-15 MED ORDER — ACETAMINOPHEN 325 MG PO TABS
650.0000 mg | ORAL_TABLET | Freq: Four times a day (QID) | ORAL | Status: DC | PRN
  Filled 2023-10-15: qty 2

## 2023-10-15 MED ORDER — OXYCODONE HCL 5 MG PO TABS
5.0000 mg | ORAL_TABLET | Freq: Three times a day (TID) | ORAL | Status: DC | PRN
  Administered 2023-10-16 – 2023-10-17 (×2): 5 mg via ORAL
  Filled 2023-10-15 (×2): qty 1

## 2023-10-15 MED ORDER — POTASSIUM CHLORIDE ER 10 MEQ PO TBCR
10.0000 meq | EXTENDED_RELEASE_TABLET | Freq: Every day | ORAL | 0 refills | Status: DC
  Filled 2023-10-15: qty 90, 90d supply, fill #0

## 2023-10-15 MED ORDER — METOPROLOL SUCCINATE ER 25 MG PO TB24
12.5000 mg | ORAL_TABLET | Freq: Every day | ORAL | Status: DC
  Administered 2023-10-16 – 2023-10-17 (×2): 12.5 mg via ORAL
  Filled 2023-10-15 (×2): qty 1

## 2023-10-15 MED ORDER — LIDOCAINE HCL 1 % IJ SOLN
INTRAMUSCULAR | Status: AC
  Filled 2023-10-15: qty 20

## 2023-10-15 MED ORDER — ALLOPURINOL 100 MG PO TABS
50.0000 mg | ORAL_TABLET | Freq: Every day | ORAL | Status: DC
  Administered 2023-10-16 – 2023-10-17 (×2): 50 mg via ORAL
  Filled 2023-10-15 (×2): qty 1

## 2023-10-15 MED ORDER — SALONPAS PAIN RELIEF PATCH EX PTCH: 1.0000 | MEDICATED_PATCH | CUTANEOUS | Status: DC

## 2023-10-15 MED ORDER — FERROUS SULFATE 325 (65 FE) MG PO TABS
325.0000 mg | ORAL_TABLET | ORAL | Status: DC
  Administered 2023-10-17: 325 mg via ORAL
  Filled 2023-10-15: qty 1

## 2023-10-15 MED ORDER — MELATONIN 5 MG PO TABS
5.0000 mg | ORAL_TABLET | Freq: Every day | ORAL | Status: DC
  Administered 2023-10-15 – 2023-10-16 (×2): 5 mg via ORAL
  Filled 2023-10-15 (×2): qty 1

## 2023-10-15 MED ORDER — LACTULOSE 10 GM/15ML PO SOLN: 20.0000 g | Freq: Every day | ORAL | Status: DC | PRN

## 2023-10-15 MED ORDER — FOLIC ACID 1 MG PO TABS
1.0000 mg | ORAL_TABLET | Freq: Every day | ORAL | Status: DC
  Administered 2023-10-16 – 2023-10-17 (×2): 1 mg via ORAL
  Filled 2023-10-15 (×2): qty 1

## 2023-10-15 MED ORDER — FUROSEMIDE 10 MG/ML IJ SOLN
40.0000 mg | Freq: Once | INTRAMUSCULAR | Status: AC
  Administered 2023-10-15: 40 mg via INTRAVENOUS
  Filled 2023-10-15: qty 4

## 2023-10-15 MED ORDER — POTASSIUM CHLORIDE CRYS ER 10 MEQ PO TBCR
10.0000 meq | EXTENDED_RELEASE_TABLET | Freq: Every day | ORAL | Status: DC
  Administered 2023-10-16: 10 meq via ORAL
  Filled 2023-10-15: qty 1

## 2023-10-15 MED ORDER — ALBUTEROL SULFATE HFA 108 (90 BASE) MCG/ACT IN AERS: 2.0000 | INHALATION_SPRAY | Freq: Four times a day (QID) | RESPIRATORY_TRACT | Status: DC | PRN

## 2023-10-15 MED ORDER — PANTOPRAZOLE SODIUM 40 MG PO TBEC
40.0000 mg | DELAYED_RELEASE_TABLET | Freq: Every day | ORAL | Status: DC
  Administered 2023-10-16 – 2023-10-17 (×2): 40 mg via ORAL
  Filled 2023-10-15 (×2): qty 1

## 2023-10-15 MED ORDER — SPIRONOLACTONE 25 MG PO TABS
50.0000 mg | ORAL_TABLET | Freq: Every day | ORAL | Status: DC
  Administered 2023-10-16 – 2023-10-17 (×2): 50 mg via ORAL
  Filled 2023-10-15 (×2): qty 2

## 2023-10-15 MED ORDER — BUDESONIDE 0.5 MG/2ML IN SUSP
1.0000 mg | Freq: Two times a day (BID) | RESPIRATORY_TRACT | Status: DC
  Administered 2023-10-15 – 2023-10-17 (×4): 1 mg via RESPIRATORY_TRACT
  Filled 2023-10-15 (×5): qty 4

## 2023-10-15 MED ORDER — ALBUMIN HUMAN 25 % IV SOLN
12.5000 g | Freq: Once | INTRAVENOUS | Status: AC
  Administered 2023-10-15: 12.5 g via INTRAVENOUS
  Filled 2023-10-15: qty 50

## 2023-10-15 MED ORDER — ACETAMINOPHEN 650 MG RE SUPP: 650.0000 mg | Freq: Four times a day (QID) | RECTAL | Status: DC | PRN

## 2023-10-15 MED ORDER — FUROSEMIDE 10 MG/ML IJ SOLN
40.0000 mg | Freq: Every day | INTRAMUSCULAR | Status: DC
  Administered 2023-10-16 – 2023-10-17 (×2): 40 mg via INTRAVENOUS
  Filled 2023-10-15 (×2): qty 4

## 2023-10-15 MED ORDER — ALBUTEROL SULFATE (2.5 MG/3ML) 0.083% IN NEBU
2.5000 mg | INHALATION_SOLUTION | Freq: Four times a day (QID) | RESPIRATORY_TRACT | Status: DC | PRN
  Administered 2023-10-16 (×3): 2.5 mg via RESPIRATORY_TRACT
  Filled 2023-10-15 (×3): qty 3

## 2023-10-15 MED ORDER — ATORVASTATIN CALCIUM 40 MG PO TABS
40.0000 mg | ORAL_TABLET | Freq: Every day | ORAL | Status: DC
  Administered 2023-10-16 – 2023-10-17 (×2): 40 mg via ORAL
  Filled 2023-10-15 (×2): qty 1

## 2023-10-15 MED ORDER — BRIMONIDINE TARTRATE 0.15 % OP SOLN
1.0000 [drp] | Freq: Two times a day (BID) | OPHTHALMIC | Status: DC
  Administered 2023-10-16 – 2023-10-17 (×2): 1 [drp] via OPHTHALMIC
  Filled 2023-10-15: qty 5

## 2023-10-15 MED ORDER — SENNOSIDES-DOCUSATE SODIUM 8.6-50 MG PO TABS: 1.0000 | ORAL_TABLET | Freq: Every evening | ORAL | Status: DC | PRN

## 2023-10-15 NOTE — ED Notes (Signed)
 Transported back to ED from ultrasound.

## 2023-10-15 NOTE — H&P (Signed)
 History and Physical  John [name] ZOX:[id number] DOB: 27-Dec-1955 DOA: 10/15/2023  PCP: Sarrah Cure, DO   Chief Complaint: Shortness of breath, abdominal distention  HPI: John [name] is a 68 y.o. male with medical history significant for chronic diastolic HF, A-flutter, HTN, CKD stage IIIB, EtOH cirrhosis with varices and ascites requiring serial paracentesis, EtOH abuse in remission, COPD, renal cell carcinoma s/p cryoablation, hepatitis C, seizure disorder, polysubstance abuse (cocaine/marijuana), anemia, thrombocytopenia, gout, PAD, CVA, and obesity who presented to the ED for evaluation of shortness of breath and abdominal distention. Patient reports he has had progressive shortness of breath and leg swelling since last week. Over the last few days, he gets out of breath just walking to the bathroom. Also reports getting paracentesis almost every month.  He has noticed his abdomen has swollen over the last 2 weeks with associated pain and stiffness. He denies any nausea, vomiting, fevers, chills, cough or chest pain. Reports significant relief after the paracentesis but remains out of breath with ambulation. Reports taking all his medications as prescribed.  ED Course: Initial vitals show afebrile, tachypnea with RR in the 20s, HR 90s 200s, SBP 90s to 110s, SpO2 96% on room air. Initial labs significant for K+ 5.0, sodium 135, creatinine 1.84, albumin  2.9, BNP 273, WBC 4.6, Hgb 9.6, platelet 117, glucose 84. EKG shows a flutter with prolonged QTc of 514. CXR shows mild cardiomegaly with mild improvement. Pt received IV Lasix  40 mg x 1 and IV albumin  12.5 g x 1.  IR was consulted for paracentesis. TRH was consulted for admission.   Review of Systems: Please see HPI for pertinent positives and negatives. A complete 10 system review of systems are otherwise negative.  Past Medical History:  Diagnosis Date   Allergy    Arthritis    oa back   Asthma    CAP (community acquired  pneumonia) 05/23/2015   Cataract    both eyes   CHF (congestive heart failure) (HCC) 2001   sees primary for   Chronic back pain    Chronic kidney disease    ckd 3   Cirrhosis (HCC)    alcoholic with h/o varices, ascites   Coronary artery disease    nonobstrucrtibe   ETOH abuse    GERD (gastroesophageal reflux disease)    Gout    Hepatitis    hepatitic c 2018 24 week tx with ecuplipsa, no hepatitic c detected after tx   History of blood transfusion 8-9- yrs ago   Hypertension    Optic neuropathy, left    PAD (peripheral artery disease) (HCC)    slight  primary manages   Polysubstance abuse (HCC)    Primary hypertension    Renal cell carcinoma (HCC) 2016, 2018 and 2022   left ablation   Seizures [facility])    age 78 none since   Sickle cell trait (HCC)    Stroke (HCC) 1998   light no problems with   Uses walker 12/04/2020   Wears dentures    Wears glasses    Past Surgical History:  Procedure Laterality Date   ARTHRODESIS METATARSALPHALANGEAL JOINT (MTPJ) Right 12/07/2020   Procedure: ARTHRODESIS METATARSALPHALANGEAL JOINT (MTPJ);  Surgeon: Dot Gazella, DPM;  Location: [facility];  Service: Podiatry;  Laterality: Right;   COLONOSCOPY  2021   ESOPHAGOGASTRODUODENOSCOPY N/A 08/14/2014   Procedure: ESOPHAGOGASTRODUODENOSCOPY (EGD);  Surgeon: Asencion Blacksmith, MD;  Location: [facility];  Service: Endoscopy;  Laterality: N/A;   ESOPHAGOGASTRODUODENOSCOPY (EGD) WITH  PROPOFOL  N/A 06/03/2017   Procedure: ESOPHAGOGASTRODUODENOSCOPY (EGD) WITH PROPOFOL ;  Surgeon: Asencion Blacksmith, MD;  Location: [facility];  Service: Endoscopy;  Laterality: N/A;   HALLUX FUSION Left 07/28/2020   Procedure: HALLUX FUSION MPJ;  Surgeon: Dot Gazella, DPM;  Location: [facility] [place];  Service: Podiatry;  Laterality: Left;   HAMMER TOE SURGERY Left 07/28/2020   Procedure: HAMMER TOE CORRECTION  2,3,AND4 LEFT FOOT;  Surgeon: Dot Gazella, DPM;  Location: [facility];  Service: Podiatry;  Laterality: Left;   HAMMER TOE SURGERY Right 12/07/2020   Procedure: HAMMER TOE CORRECTION  2-4;  Surgeon: Dot Gazella, DPM;  Location: [facility];  Service: Podiatry;  Laterality: Right;   IR PARACENTESIS  09/05/2023   IR PARACENTESIS  09/16/2023   IR RADIOLOGIST EVAL & MGMT  09/25/2016   IR RADIOLOGIST EVAL & MGMT  12/10/2016   IR RADIOLOGIST EVAL & MGMT  03/25/2018   IR RADIOLOGIST EVAL & MGMT  03/24/2019   IR RADIOLOGIST EVAL & MGMT  05/17/2020   IR RADIOLOGIST EVAL & MGMT  08/03/2020   IR RADIOLOGIST EVAL & MGMT  11/22/2020   IR RADIOLOGIST EVAL & MGMT  05/31/2021   METATARSAL HEAD EXCISION Left 07/28/2020   Procedure: METATARSAL HEAD EXCISION TOES 2,3,AND 4  LEFT FOOT;  Surgeon: Dot Gazella, DPM;  Location: [facility];  Service: Podiatry;  Laterality: Left;   METATARSAL OSTEOTOMY Right 12/07/2020   Procedure: METATARSAL OSTEOTOMY TOES 2-4 RIGHT FOOT;  Surgeon: Dot Gazella, DPM;  Location: [facility];  Service: Podiatry;  Laterality: Right;   none     RADIOFREQUENCY ABLATION Left 11/08/2016   Procedure: LEFT RENAL CRYOABLATION;  Surgeon: Lucinda Saber, MD;  Location: [facility];  Service: Anesthesiology;  Laterality: Left;   RADIOLOGY WITH ANESTHESIA N/A 08/15/2014   Procedure: RADIOLOGY WITH ANESTHESIA;  Surgeon: Lucinda Saber, MD;  Location: [facility];  Service: Radiology;  Laterality: N/A;   RADIOLOGY WITH ANESTHESIA Left 07/05/2020   Procedure: CT CRYOABLATION;  Surgeon: Lucinda Saber, MD;  Location: [facility];  Service: Anesthesiology;  Laterality: Left;   UPPER GASTROINTESTINAL ENDOSCOPY     Social History:  reports that he quit smoking about 12 years ago. His smoking use included cigarettes. He started smoking about 32 years ago. He has a 6 pack-year smoking history. He has never used smokeless tobacco. He reports current drug use. Drugs: Cocaine and Marijuana. He reports that he does not drink  alcohol.  Allergies  Allergen Reactions   Penicillins Other (See Comments)    Convulsions, Patient could not walk, childhood allergy     Family History  Problem Relation Age of Onset   Hypertension Mother        Living   Kidney disease Mother    Diabetes Mother    Heart disease Mother    Hypertension Father        Deceased, 28   Ulcers Father    Stomach cancer Father    Hypertension Brother    Diabetes Brother    Kidney disease Brother    Hypertension Sister    Colon cancer Neg Hx    Esophageal cancer Neg Hx    Rectal cancer Neg Hx      Prior to Admission medications   Medication Sig Start Date End Date Taking? Authorizing Provider  acetaminophen  (TYLENOL ) 325 MG tablet Take 650 mg by mouth every 8 (eight) hours as needed for mild pain (pain score 1-3)  or moderate pain (pain score 4-6). Do not exceed 6 tabs per day.    [provider]  allopurinol  (ZYLOPRIM ) 100 MG tablet Take 0.5 tablets (50 mg total) by mouth daily. 09/06/23 12/05/23  [place], Rema Care, DO  atorvastatin  (LIPITOR) 40 MG tablet Take 1 tablet (40 mg total) by mouth daily. 09/06/23 12/05/23  [place], Rema Care, DO  brimonidine  (ALPHAGAN ) 0.15 % ophthalmic solution Place 1 drop into both eyes 2 (two) times daily. 09/06/23   [place], Rema Care, DO  feeding supplement (ENSURE ENLIVE / ENSURE PLUS) LIQD Take 237 mLs by mouth 2 (two) times daily between meals. 07/08/23   Ephriam Hashimoto, MD  FEROSUL 325 (65 Fe) MG tablet Take 1 tablet (325 mg total) by mouth every Monday, Wednesday, and Friday. 09/08/23 12/07/23  [place], Eric J, DO  FLOVENT  HFA 220 MCG/ACT inhaler Inhale 2 puffs into the lungs in the morning and at bedtime. 09/06/23   [place], Rema Care, DO  fluticasone  (FLONASE ) 50 MCG/ACT nasal spray Place 1 spray into both nostrils as needed.    [provider]  folic acid  (FOLVITE ) 1 MG tablet Take 1 tablet (1 mg total) by mouth daily. 10/15/23 01/13/24  Kingsley, Victoria K, DO  furosemide  (LASIX ) 40 MG tablet  Take 1 tablet (40 mg total) by mouth daily. 10/15/23 01/13/24  Kingsley, Victoria K, DO  hydroxypropyl methylcellulose / hypromellose (ISOPTO TEARS / GONIOVISC) 2.5 % ophthalmic solution Place 1 drop into the right eye as needed for dry eyes.    [provider]  ketoconazole  (NIZORAL ) 2 % shampoo Apply 1 Application topically See admin instructions. Shampoo the scalp 2 times a week    [provider]  lactulose  (CHRONULAC ) 10 GM/15ML solution Take 30 mLs (20 g total) by mouth daily as needed for mild constipation. 10/15/23 01/13/24  Kingsley, Victoria K, DO  melatonin 5 MG TABS Take 1 tablet (5 mg total) by mouth at bedtime. 09/06/23 12/05/23  [place], Eric J, DO  Menthol -Methyl Salicylate (SALONPAS PAIN RELIEF PATCH) PTCH Apply 1 patch topically See admin instructions. Apply one patch to the mid-back & one to the lower back in the morning and remove approx 12 hours later    [provider]  metoprolol  succinate (TOPROL  XL) 25 MG 24 hr tablet Take 0.5 tablets (12.5 mg total) by mouth daily. 10/15/23 01/13/24  Kingsley, Victoria K, DO  omeprazole  (PRILOSEC) 40 MG capsule Take 1 capsule (40 mg total) by mouth daily before breakfast. Patient not taking: Reported on 10/15/2023 10/15/23 01/13/24  Kingsley, Victoria K, DO  oxyCODONE  (OXY IR/ROXICODONE ) 5 MG immediate release tablet Take 1 tablet (5 mg total) by mouth every 8 (eight) hours as needed for severe pain (pain score 7-10). 09/06/23   [place], Rema Care, DO  potassium chloride  (KLOR-CON  M) 10 MEQ tablet Take 1 tablet (10 mEq total) by mouth daily. 10/15/23 01/13/24  Kingsley, Victoria K, DO  PROAIR  HFA 108 (90 Base) MCG/ACT inhaler Inhale 2 puffs into the lungs every 6 (six) hours as needed for wheezing or shortness of breath.    [provider]  spironolactone  (ALDACTONE ) 50 MG tablet Take 1 tablet (50 mg total) by mouth daily. 10/15/23 01/13/24  Kingsley, Victoria K, DO  terbinafine (LAMISIL) 1 % cream Apply 1 Application topically  See admin instructions. Apply to both feet 2 times a day as directed    [provider]  thiamine  (VITAMIN B1) 100 MG tablet Take 1 tablet (100 mg total) by mouth daily. Patient not taking:  Reported on 10/15/2023 10/15/23 11/14/23  Nolberto Batty, DO    Physical Exam: BP 101/73   Pulse 70   Temp (!) 97.3 F (36.3 C) (Oral)   Resp 18   Ht 5' 5 (1.651 m)   Wt 103.9 kg   SpO2 93%   BMI 38.11 kg/m  General: Pleasant, well-appearing man laying in bed. No acute distress. HEENT: [place]/AT. Anicteric sclera CV: RRR. No murmurs, rubs, or gallops.  1+ BLE edema Pulmonary: Lungs CTAB. Normal effort. Diffuse rales Abdominal: Soft, mild abdominal distention.  Nontender. Normal bowel sounds. Extremities: Palpable radial and DP pulses. Normal ROM. Skin: Warm and dry. Hyperpigmentation of both legs. Neuro: A&Ox3. Moves all extremities. Normal sensation to light touch. No focal deficit. Psych: Normal mood and affect          Labs on Admission:  Basic Metabolic Panel: Recent Labs  Lab 10/15/23 1330  NA 135  K 5.0  CL 103  CO2 25  GLUCOSE 84  BUN 17  CREATININE 1.84*  CALCIUM  8.3*   Liver Function Tests: Recent Labs  Lab 10/15/23 1330  AST 19  ALT 9  ALKPHOS 79  BILITOT 1.0  PROT 7.4  ALBUMIN  2.9*   No results for input(s): LIPASE, AMYLASE in the last 168 hours. No results for input(s): AMMONIA in the last 168 hours. CBC: Recent Labs  Lab 10/15/23 1330  WBC 4.6  NEUTROABS 3.3  HGB 9.6*  HCT 31.6*  MCV 82.1  PLT 117*   Cardiac Enzymes: No results for input(s): CKTOTAL, CKMB, CKMBINDEX, TROPONINI in the last 168 hours. BNP (last 3 results) Recent Labs    09/05/23 0620 09/06/23 0406 10/15/23 1330  BNP 297.1* 250.3* 273.0*    ProBNP (last 3 results) Recent Labs    11/05/22 1358 11/28/22 0849 09/04/23 1048  PROBNP 3,248* 4,858* 2,814.0*    CBG: No results for input(s): GLUCAP in the last 168 hours.  Radiological Exams on  Admission: US  Paracentesis Result Date: 10/15/2023 INDICATION: 68 year old male with a history of CHF, alcoholic cirrhosis, with recurrent ascites. Requested for diagnostic and therapeutic paracentesis EXAM: ULTRASOUND GUIDED DIAGNOSTIC AND THERAPEUTIC PARACENTESIS MEDICATIONS: 7 cc of 1% lidocaine  COMPLICATIONS: None immediate. PROCEDURE: Informed written consent was obtained from the patient after a discussion of the risks, benefits and alternatives to treatment. A timeout was performed prior to the initiation of the procedure. Initial ultrasound scanning demonstrates a large amount of ascites within the right lower abdominal quadrant. The right lower abdomen was prepped and draped in the usual sterile fashion. 1% lidocaine  was used for local anesthesia. Following this, a 19 gauge, 7-cm, Yueh catheter was introduced. An ultrasound image was saved for documentation purposes. The paracentesis was performed. The catheter was removed and a dressing was applied. The patient tolerated the procedure well without immediate post procedural complication. Patient received post-procedure intravenous albumin ; see nursing notes for details. FINDINGS: A total of approximately 5.4 L of clear, amber colored peritoneal fluid was removed. Samples were sent to the laboratory as requested by the clinical team. IMPRESSION: Successful ultrasound-guided paracentesis yielding 5.4 liters of peritoneal fluid. Procedure performed by Lambert Pillion, PA-C Electronically Signed   By: Nicoletta Barrier M.D.   On: 10/15/2023 16:27   DG Chest Port 1 View Result Date: 10/15/2023 CLINICAL DATA:  Shortness of breath.  Mild hypoxia. EXAM: PORTABLE CHEST 1 VIEW COMPARISON:  09/04/2023 FINDINGS: Mildly enlarged cardiac silhouette with mild improvement. Underpenetration of the left lung base with no gross airspace consolidation or pleural fluid seen.  Clear right lung. Unremarkable bones. IMPRESSION: 1. Mild cardiomegaly with mild improvement. 2.  Underpenetration of the left lung base with no gross airspace consolidation or pleural fluid seen. Electronically Signed   By: Catherin Closs M.D.   On: 10/15/2023 15:40   Assessment/Plan John [name] is a 68 y.o. male with medical history significant for chronic diastolic HF, A-flutter, HTN, CKD stage IIIB, EtOH cirrhosis with varices and ascites requiring serial paracentesis, EtOH abuse in remission, COPD, renal cell carcinoma s/p cryoablation, hepatitis C, seizure disorder, polysubstance abuse (cocaine/marijuana), anemia, thrombocytopenia, gout, PAD, CVA, and obesity who presented to the ED for evaluation of shortness of breath and abdominal distention and admitted for acute on chronic diastolic heart failure and abdominal ascites  # Acute on chronic diastolic heart failure - Last TTE in 04/2023 shows EF 55-60%, mild LVH, moderately dilated LA - Pt presented with progressive shortness of breath and lower extremity edema - Pt with clinical, laboratory and radiological signs of CHF exacerbation - Start IV lasix  40 mg daily - Continue metoprolol  and spironolactone  - Strict I&O, daily weights - Maintain K+ > 4.0, Mag > 2.0 - Telemetry  # Abdominal ascites # Decompensated alcoholic cirrhosis with varices and thrombocytopenia - History of poor compliance but reports taking his medications daily - S/p large-volume paracentesis on admission with 5.4 L removed - Reports significant improvement in abdominal pain and distention - Based on body fluid cell count, patient does not meet criteria for SBP - Platelet improved from 105 a month ago to 117 - Continue IV Lasix , and oral spironolactone  - Trend LFTs and CBC  # HTN - BP slightly soft today but SBP in the 90s to 110s - Resume metoprolol  and spironolactone  tomorrow  # A-fib/A-flutter - HR mostly in the 70s to 90s - Not a candidate for anticoagulation - Continue on Toprol -XL  # CKD 3B - Creatinine of 1.84 around his baseline of  1.7-1.9 - Continue IV diuresing as above - Trend renal function - Avoid nephrotoxic agents  # Iron deficiency anemia - Hgb stable at 9.6 - Continue iron supplementation  # HLD - Continue atorvastatin   # COPD - Chronic and stable - Continue inhalers  # GERD - Continue Protonix   # Gout - Continue allopurinol   # Class II obesity Body mass index is 38.11 kg/m. Filed Weights   10/15/23 1254  Weight: 103.9 kg  - F/u with PCP for weight lost and nutrition counseling   DVT prophylaxis: SCDs    Code Status: Full Code  Consults called: IR  Family Communication: No family at bedside  Severity of Illness: The appropriate patient status for this patient is OBSERVATION. Observation status is judged to be reasonable and necessary in order to provide the required intensity of service to ensure the patient's safety. The patient's presenting symptoms, physical exam findings, and initial radiographic and laboratory data in the context of their medical condition is felt to place them at decreased risk for further clinical deterioration. Furthermore, it is anticipated that the patient will be medically stable for discharge from the hospital within 2 midnights of admission.   Level of care: Telemetry   This record has been created using Conservation officer, historic buildings. Errors have been sought and corrected, but may not always be located. Such creation errors do not reflect on the standard of care.   Vita Grip, MD 10/15/2023, 5:04 PM [facility] Pager: [phone] Isaiah 41:10   If 7PM-7AM, please contact night-coverage [email or web address] Password [email or web address]

## 2023-10-15 NOTE — ED Notes (Signed)
 Transported to ultrasound for paracentesis.

## 2023-10-15 NOTE — Procedures (Signed)
 PROCEDURE SUMMARY:  Successful image-guided paracentesis from the right abdomen.  Yielded 5.4 liters of clear, amber colored peritoneal fluid.  No immediate complications.  EBL: zero Patient tolerated well.   Specimen was sent for labs.  Please see imaging section of Epic for full dictation.  Gordy Lauber Mandeep Ferch PA-C 10/15/2023 3:16 PM

## 2023-10-15 NOTE — ED Provider Notes (Signed)
 [place] EMERGENCY DEPARTMENT AT [facility] Provider Note   CSN: [id number] Arrival date & time: 10/15/23  1215     Patient presents with: Shortness of Breath   John [name] is a 68 y.o. male.   Patient is a 68 year old male with a past medical history of CHF, alcohol cirrhosis, CKD presenting to the emergency department with shortness of breath.  Patient states for the last 3 days he has had worsening shortness of breath.  States he was taking his medications as prescribed and just ran out last night.  He states that he feels short of breath all the time.  He states that his abdomen feels more swollen which is making it harder to breathe.  He states that he has had a cough productive of mucus.  Denies any fever or associated chest pain.  States his abdomen feels swollen but denies any pain.  States that he has had some swelling in his legs.  States that he does frequently need fluid drained from his abdomen and this was last done about a month ago.  The history is provided by the patient.  Shortness of Breath      Prior to Admission medications   Medication Sig Start Date End Date Taking? Authorizing Provider  acetaminophen  (TYLENOL ) 325 MG tablet Take 650 mg by mouth every 8 (eight) hours as needed for mild pain (pain score 1-3) or moderate pain (pain score 4-6). Do not exceed 6 tabs per day.    [provider]  allopurinol  (ZYLOPRIM ) 100 MG tablet Take 0.5 tablets (50 mg total) by mouth daily. 09/06/23 12/05/23  [place], Rema Care, DO  atorvastatin  (LIPITOR) 40 MG tablet Take 1 tablet (40 mg total) by mouth daily. 09/06/23 12/05/23  [place], Eric J, DO  brimonidine  (ALPHAGAN ) 0.15 % ophthalmic solution Place 1 drop into both eyes 2 (two) times daily. 09/06/23   [place], Rema Care, DO  feeding supplement (ENSURE ENLIVE / ENSURE PLUS) LIQD Take 237 mLs by mouth 2 (two) times daily between meals. 07/08/23   Ephriam Hashimoto, MD  FEROSUL 325 (65 Fe) MG tablet Take 1  tablet (325 mg total) by mouth every Monday, Wednesday, and Friday. 09/08/23 12/07/23  [place], Eric J, DO  FLOVENT  HFA 220 MCG/ACT inhaler Inhale 2 puffs into the lungs in the morning and at bedtime. 09/06/23   [place], Rema Care, DO  fluticasone  (FLONASE ) 50 MCG/ACT nasal spray Place 1 spray into both nostrils as needed.    [provider]  folic acid  (FOLVITE ) 1 MG tablet Take 1 tablet (1 mg total) by mouth daily. 10/15/23 01/13/24  Kingsley, Dimonique Bourdeau K, DO  furosemide  (LASIX ) 40 MG tablet Take 1 tablet (40 mg total) by mouth daily. 10/15/23 01/13/24  Kingsley, Earsel Shouse K, DO  hydroxypropyl methylcellulose / hypromellose (ISOPTO TEARS / GONIOVISC) 2.5 % ophthalmic solution Place 1 drop into the right eye as needed for dry eyes.    [provider]  ketoconazole  (NIZORAL ) 2 % shampoo Apply 1 Application topically See admin instructions. Shampoo the scalp 2 times a week    [provider]  lactulose  (CHRONULAC ) 10 GM/15ML solution Take 30 mLs (20 g total) by mouth daily as needed for mild constipation. 10/15/23 01/13/24  Kingsley, Ousman Dise K, DO  melatonin 5 MG TABS Take 1 tablet (5 mg total) by mouth at bedtime. 09/06/23 12/05/23  [place], Rema Care, DO  Menthol -Methyl Salicylate (SALONPAS PAIN RELIEF PATCH) PTCH Apply 1 patch topically See admin instructions. Apply one patch to  the mid-back & one to the lower back in the morning and remove approx 12 hours later    [provider]  metoprolol  succinate (TOPROL  XL) 25 MG 24 hr tablet Take 0.5 tablets (12.5 mg total) by mouth daily. 10/15/23 01/13/24  Kingsley, Talik Casique K, DO  omeprazole  (PRILOSEC) 40 MG capsule Take 1 capsule (40 mg total) by mouth daily before breakfast. 10/15/23 01/13/24  Kingsley, Maxfield Gildersleeve K, DO  oxyCODONE  (OXY IR/ROXICODONE ) 5 MG immediate release tablet Take 1 tablet (5 mg total) by mouth every 8 (eight) hours as needed for severe pain (pain score 7-10). 09/06/23   [place], Rema Care, DO  potassium chloride  (KLOR-CON  M)  10 MEQ tablet Take 1 tablet (10 mEq total) by mouth daily. 10/15/23 01/13/24  Kingsley, Kessler Solly K, DO  PROAIR  HFA 108 (90 Base) MCG/ACT inhaler Inhale 2 puffs into the lungs every 6 (six) hours as needed for wheezing or shortness of breath.    [provider]  spironolactone  (ALDACTONE ) 50 MG tablet Take 1 tablet (50 mg total) by mouth daily. 10/15/23 01/13/24  Kingsley, Casady Voshell K, DO  terbinafine (LAMISIL) 1 % cream Apply 1 Application topically See admin instructions. Apply to both feet 2 times a day as directed    [provider]  thiamine  (VITAMIN B1) 100 MG tablet Take 1 tablet (100 mg total) by mouth daily. 10/15/23 11/14/23  Kingsley, Tehilla Coffel K, DO    Allergies: Penicillins    Review of Systems  Respiratory:  Positive for shortness of breath.     Updated Vital Signs BP 101/73   Pulse 70   Temp (!) 97.3 F (36.3 C) (Oral)   Resp 18   Ht 5' 5 (1.651 m)   Wt 103.9 kg   SpO2 93%   BMI 38.11 kg/m   Physical Exam Vitals and nursing note reviewed.  Constitutional:      General: He is not in acute distress.    Appearance: He is well-developed. He is obese.  HENT:     Head: Normocephalic and atraumatic.     Mouth/Throat:     Mouth: Mucous membranes are moist.   Eyes:     Extraocular Movements: Extraocular movements intact.   Neck:     Vascular: JVD present.   Cardiovascular:     Rate and Rhythm: Normal rate and regular rhythm.  Pulmonary:     Effort: Tachypnea (Conversational dyspnea) present.     Breath sounds: Rales (Diffuse) present.  Abdominal:     Palpations: Abdomen is soft.     Comments: Abdomen distended with palpable fluid wave, no point abdominal tenderness   Musculoskeletal:        General: Normal range of motion.     Cervical back: Normal range of motion and neck supple.     Right lower leg: Edema (Approximately 2+ with chronic overlying skin changes) present.     Left lower leg: Edema (Approximately 2+ with chronic overlying skin  changes) present.   Skin:    General: Skin is warm and dry.   Neurological:     General: No focal deficit present.     Mental Status: He is alert and oriented to person, place, and time.   Psychiatric:        Mood and Affect: Mood normal.        Behavior: Behavior normal.     (all labs ordered are listed, but only abnormal results are displayed) Labs Reviewed  COMPREHENSIVE METABOLIC PANEL WITH GFR - Abnormal; Notable for the following  components:      Result Value   Creatinine, Ser 1.84 (*)    Calcium  8.3 (*)    Albumin  2.9 (*)    GFR, Estimated 40 (*)    All other components within normal limits  CBC WITH DIFFERENTIAL/PLATELET - Abnormal; Notable for the following components:   RBC 3.85 (*)    Hemoglobin 9.6 (*)    HCT 31.6 (*)    MCH 24.9 (*)    RDW 17.7 (*)    Platelets 117 (*)    Lymphs Abs 0.4 (*)    All other components within normal limits  BRAIN NATRIURETIC PEPTIDE - Abnormal; Notable for the following components:   B Natriuretic Peptide 273.0 (*)    All other components within normal limits  GRAM STAIN  BODY FLUID CULTURE W GRAM STAIN  LACTATE DEHYDROGENASE, PLEURAL OR PERITONEAL FLUID  BODY FLUID CELL COUNT WITH DIFFERENTIAL  ALBUMIN , PLEURAL OR PERITONEAL FLUID   PROTEIN, PLEURAL OR PERITONEAL FLUID  GLUCOSE, PLEURAL OR PERITONEAL FLUID  TROPONIN I (HIGH SENSITIVITY)    EKG: EKG Interpretation Date/Time:  Wednesday October 15 2023 14:02:26 EDT Ventricular Rate:  99 PR Interval:    QRS Duration:  85 QT Interval:  400 QTC Calculation: 514 R Axis:   73  Text Interpretation: Atrial flutter Low voltage, precordial leads Borderline repolarization abnormality Prolonged QT interval No significant change since last tracing Confirmed by Celesta Coke ([id number]) on 10/15/2023 2:04:38 PM  Radiology: US  Paracentesis Result Date: 10/15/2023 INDICATION: 68 year old male with a history of CHF, alcoholic cirrhosis, with recurrent ascites. Requested for diagnostic  and therapeutic paracentesis EXAM: ULTRASOUND GUIDED DIAGNOSTIC AND THERAPEUTIC PARACENTESIS MEDICATIONS: 7 cc of 1% lidocaine  COMPLICATIONS: None immediate. PROCEDURE: Informed written consent was obtained from the patient after a discussion of the risks, benefits and alternatives to treatment. A timeout was performed prior to the initiation of the procedure. Initial ultrasound scanning demonstrates a large amount of ascites within the right lower abdominal quadrant. The right lower abdomen was prepped and draped in the usual sterile fashion. 1% lidocaine  was used for local anesthesia. Following this, a 19 gauge, 7-cm, Yueh catheter was introduced. An ultrasound image was saved for documentation purposes. The paracentesis was performed. The catheter was removed and a dressing was applied. The patient tolerated the procedure well without immediate post procedural complication. Patient received post-procedure intravenous albumin ; see nursing notes for details. FINDINGS: A total of approximately 5.4 L of clear, amber colored peritoneal fluid was removed. Samples were sent to the laboratory as requested by the clinical team. IMPRESSION: Successful ultrasound-guided paracentesis yielding 5.4 liters of peritoneal fluid. Procedure performed by Lambert Pillion, PA-C Electronically Signed   By: Nicoletta Barrier M.D.   On: 10/15/2023 16:27   DG Chest Port 1 View Result Date: 10/15/2023 CLINICAL DATA:  Shortness of breath.  Mild hypoxia. EXAM: PORTABLE CHEST 1 VIEW COMPARISON:  09/04/2023 FINDINGS: Mildly enlarged cardiac silhouette with mild improvement. Underpenetration of the left lung base with no gross airspace consolidation or pleural fluid seen. Clear right lung. Unremarkable bones. IMPRESSION: 1. Mild cardiomegaly with mild improvement. 2. Underpenetration of the left lung base with no gross airspace consolidation or pleural fluid seen. Electronically Signed   By: Catherin Closs M.D.   On: 10/15/2023 15:40     Procedures    Medications Ordered in the ED  albumin  human 25 % solution 12.5 g (0 g Intravenous Stopped 10/15/23 1445)  furosemide  (LASIX ) injection 40 mg (40 mg Intravenous Given 10/15/23 1354)  Clinical Course as of 10/15/23 1651  Wed Oct 15, 2023  1439 BNP mildly elevated, labs otherwise at baseline. [VK]  [id number] Patient reports significant improvement of symptoms after paracentesis and lasix , has started urinating. Will have ambulatory pulse ox. If able to ambulate without significant SOB and no signs of SBP on labs likely will be able to be discharged home. [VK]  1647 Upon ambulation, patient became severely short of breath and desatted to the 50's. Will need to be admitted for further diuresis. [VK]    Clinical Course User Index [VK] Kingsley, Simcha Farrington K, DO                                 Medical Decision Making This patient presents to the ED with chief complaint(s) of SOB with pertinent past medical history of CHF, CKD, alcohol cirrhosis which further complicates the presenting complaint. The complaint involves an extensive differential diagnosis and also carries with it a high risk of complications and morbidity.    The differential diagnosis includes ACS, arrhythmia, anemia, pneumonia, pneumothorax, pulmonary edema, pleural effusion, CHF exacerbation, ascites, considering SBP less likely without significant abdominal pain, hypoalbuminemia  Additional history obtained: Additional history obtained from N/A Records reviewed previous admission documents  ED Course and Reassessment: On patient's arrival he is mildly tachycardic and tachypneic otherwise in no acute distress.  Does appear significantly volume overloaded with pitting edema, Rales and elevated JVD.  Patient will have labs including troponin and BNP as well as chest x-ray performed.  He will be started on Lasix .  Will consult IR for diagnostic and therapeutic paracentesis.  Independent labs interpretation:  The following labs were  independently interpreted: mildly elevated BNP, otherwise labs at baseline  Independent visualization of imaging: - I independently visualized the following imaging with scope of interpretation limited to determining acute life threatening conditions related to emergency care: CXR, which revealed stable cardiomegaly with mildly improved vascular congestion  Consultation: - Consulted or discussed management/test interpretation w/ external professional: hospitalist  Consideration for admission or further workup: patient requires admission for CHF exacerbation Social Determinants of health: N/A    Amount and/or Complexity of Data Reviewed Labs: ordered. Radiology: ordered.  Risk OTC drugs. Prescription drug management. Decision regarding hospitalization.       Final diagnoses:  Acute on chronic congestive heart failure, unspecified heart failure type (HCC)  Ascites due to alcoholic cirrhosis [facility])    ED Discharge Orders          Ordered    furosemide  (LASIX ) 40 MG tablet  Daily        10/15/23 1614    lactulose  (CHRONULAC ) 10 GM/15ML solution  Daily PRN        10/15/23 1614    metoprolol  succinate (TOPROL  XL) 25 MG 24 hr tablet  Daily        10/15/23 1614    omeprazole  (PRILOSEC) 40 MG capsule  Daily before breakfast        10/15/23 1614    potassium chloride  (KLOR-CON  M) 10 MEQ tablet  Daily        10/15/23 1614    spironolactone  (ALDACTONE ) 50 MG tablet  Daily        10/15/23 1614    thiamine  (VITAMIN B1) 100 MG tablet  Daily        10/15/23 1614    folic acid  (FOLVITE ) 1 MG tablet  Daily        10/15/23  [address]               Kingsley, Vadis Slabach K, [place] 10/15/23 1651

## 2023-10-15 NOTE — ED Notes (Signed)
 Assisted patient to ambulate, pt complained of shortness of breath and appears gasping for air. Saturation=56% (room air). Provider made aware.

## 2023-10-15 NOTE — Discharge Instructions (Signed)
 You were seen in the emergency department for your shortness of breath.  You did appear to have extra fluid on your lungs in addition to the fluid buildup in your abdomen.  We were able to give you Lasix  to help pee off some of the fluid and you are able to have significant mount of fluid drained off your abdomen.  I have given you refills of your regular medications and you should continue to take these as prescribed.  You should follow-up with your primary doctor in the next few days to have your symptoms rechecked and see if they may be able to establish regularly scheduled outpatient paracenteses to drain the fluid from your belly when it builds up.  You should return to the emergency department if you are having worsening shortness of breath, severe chest pain, severe abdominal pain, fever or any other new or concerning symptoms.

## 2023-10-15 NOTE — ED Notes (Signed)
 Try to ambulated the pt, pt gave out of breath informed the nurse put pt back in bed

## 2023-10-15 NOTE — ED Triage Notes (Signed)
 BIBA from home with [facility], abdominal distention. Pt has a hx of CHF, states has not missed any doses of his meds. 124/74 bp 123 hr 97% room air 31 etco2 86 cbg

## 2023-10-16 ENCOUNTER — Encounter (HOSPITAL_COMMUNITY): Payer: Self-pay | Admitting: *Deleted

## 2023-10-16 ENCOUNTER — Other Ambulatory Visit (HOSPITAL_COMMUNITY): Payer: Self-pay

## 2023-10-16 DIAGNOSIS — I130 Hypertensive heart and chronic kidney disease with heart failure and stage 1 through stage 4 chronic kidney disease, or unspecified chronic kidney disease: Secondary | ICD-10-CM | POA: Diagnosis present

## 2023-10-16 DIAGNOSIS — D696 Thrombocytopenia, unspecified: Secondary | ICD-10-CM | POA: Diagnosis present

## 2023-10-16 DIAGNOSIS — D573 Sickle-cell trait: Secondary | ICD-10-CM | POA: Diagnosis present

## 2023-10-16 DIAGNOSIS — K7031 Alcoholic cirrhosis of liver with ascites: Secondary | ICD-10-CM | POA: Diagnosis present

## 2023-10-16 DIAGNOSIS — Z8249 Family history of ischemic heart disease and other diseases of the circulatory system: Secondary | ICD-10-CM | POA: Diagnosis not present

## 2023-10-16 DIAGNOSIS — M109 Gout, unspecified: Secondary | ICD-10-CM | POA: Diagnosis present

## 2023-10-16 DIAGNOSIS — H469 Unspecified optic neuritis: Secondary | ICD-10-CM | POA: Diagnosis present

## 2023-10-16 DIAGNOSIS — I5033 Acute on chronic diastolic (congestive) heart failure: Secondary | ICD-10-CM | POA: Diagnosis present

## 2023-10-16 DIAGNOSIS — E66812 Obesity, class 2: Secondary | ICD-10-CM | POA: Diagnosis present

## 2023-10-16 DIAGNOSIS — E785 Hyperlipidemia, unspecified: Secondary | ICD-10-CM | POA: Diagnosis present

## 2023-10-16 DIAGNOSIS — I4892 Unspecified atrial flutter: Secondary | ICD-10-CM | POA: Diagnosis present

## 2023-10-16 DIAGNOSIS — N1832 Chronic kidney disease, stage 3b: Secondary | ICD-10-CM | POA: Diagnosis present

## 2023-10-16 DIAGNOSIS — J4489 Other specified chronic obstructive pulmonary disease: Secondary | ICD-10-CM | POA: Diagnosis present

## 2023-10-16 DIAGNOSIS — R600 Localized edema: Secondary | ICD-10-CM | POA: Diagnosis present

## 2023-10-16 DIAGNOSIS — G40909 Epilepsy, unspecified, not intractable, without status epilepticus: Secondary | ICD-10-CM | POA: Diagnosis present

## 2023-10-16 DIAGNOSIS — D509 Iron deficiency anemia, unspecified: Secondary | ICD-10-CM | POA: Diagnosis present

## 2023-10-16 DIAGNOSIS — Z7951 Long term (current) use of inhaled steroids: Secondary | ICD-10-CM | POA: Diagnosis not present

## 2023-10-16 DIAGNOSIS — I4891 Unspecified atrial fibrillation: Secondary | ICD-10-CM | POA: Diagnosis present

## 2023-10-16 DIAGNOSIS — Z6838 Body mass index (BMI) 38.0-38.9, adult: Secondary | ICD-10-CM | POA: Diagnosis not present

## 2023-10-16 DIAGNOSIS — K219 Gastro-esophageal reflux disease without esophagitis: Secondary | ICD-10-CM | POA: Diagnosis present

## 2023-10-16 DIAGNOSIS — Z79899 Other long term (current) drug therapy: Secondary | ICD-10-CM | POA: Diagnosis not present

## 2023-10-16 DIAGNOSIS — Z87891 Personal history of nicotine dependence: Secondary | ICD-10-CM | POA: Diagnosis not present

## 2023-10-16 DIAGNOSIS — I739 Peripheral vascular disease, unspecified: Secondary | ICD-10-CM | POA: Diagnosis present

## 2023-10-16 DIAGNOSIS — F1011 Alcohol abuse, in remission: Secondary | ICD-10-CM | POA: Diagnosis present

## 2023-10-16 DIAGNOSIS — I2510 Atherosclerotic heart disease of native coronary artery without angina pectoris: Secondary | ICD-10-CM | POA: Diagnosis present

## 2023-10-16 LAB — CBC
HCT: 31.8 % — ABNORMAL LOW (ref 39.0–52.0)
Hemoglobin: 9.7 g/dL — ABNORMAL LOW (ref 13.0–17.0)
MCH: 25 pg — ABNORMAL LOW (ref 26.0–34.0)
MCHC: 30.5 g/dL (ref 30.0–36.0)
MCV: 82 fL (ref 80.0–100.0)
Platelets: 114 10*3/uL — ABNORMAL LOW (ref 150–400)
RBC: 3.88 MIL/uL — ABNORMAL LOW (ref 4.22–5.81)
RDW: 17.4 % — ABNORMAL HIGH (ref 11.5–15.5)
WBC: 4.7 10*3/uL (ref 4.0–10.5)
nRBC: 0 % (ref 0.0–0.2)

## 2023-10-16 LAB — COMPREHENSIVE METABOLIC PANEL WITH GFR
ALT: 8 U/L (ref 0–44)
AST: 18 U/L (ref 15–41)
Albumin: 2.6 g/dL — ABNORMAL LOW (ref 3.5–5.0)
Alkaline Phosphatase: 60 U/L (ref 38–126)
Anion gap: 7 (ref 5–15)
BUN: 18 mg/dL (ref 8–23)
CO2: 23 mmol/L (ref 22–32)
Calcium: 7.8 mg/dL — ABNORMAL LOW (ref 8.9–10.3)
Chloride: 103 mmol/L (ref 98–111)
Creatinine, Ser: 1.86 mg/dL — ABNORMAL HIGH (ref 0.61–1.24)
GFR, Estimated: 39 mL/min — ABNORMAL LOW (ref >60)
Glucose, Bld: 88 mg/dL (ref 70–99)
Potassium: 4.9 mmol/L (ref 3.5–5.1)
Sodium: 133 mmol/L — ABNORMAL LOW (ref 135–145)
Total Bilirubin: 1.2 mg/dL (ref 0.0–1.2)
Total Protein: 6.4 g/dL — ABNORMAL LOW (ref 6.5–8.1)

## 2023-10-16 LAB — PATHOLOGIST SMEAR REVIEW

## 2023-10-16 LAB — MAGNESIUM: Magnesium: 1.8 mg/dL (ref 1.7–2.4)

## 2023-10-16 LAB — PHOSPHORUS: Phosphorus: 3 mg/dL (ref 2.5–4.6)

## 2023-10-16 NOTE — Progress Notes (Signed)
 Progress Note   Patient: John [name] FMW:[id number] DOB: [birth year] DOA: 10/15/2023     0 DOS: the patient was seen and examined on 10/16/2023   Brief hospital course: The patient is a 68 yr old man with a medical history significant for chronic diastolic heart failure, atrial flutter, HTN, CKD stage IIIB, alcohol cirrhosis with varices and ascites requiring serial paracentesis, and alcohol abuse in remission, COPD, renal cell carcinoma s/p cryoablation, hepatitis C, seizure disorder, polycubstance abuse, anemia, thrombocytopenia, gout, PAD, CVA, and obesity. He presented to the ED with complaints of shortness of breath, abdominal distention, and lower extremity edema for one week. He did undergo paracentesis on 10/15/2023. Peritoneal appeared free of infection. 5.4 liters were removed.   The patient was admitted to inpatient status in a med/surg bed on 10/15/2023. On 10/16/2023 the patient states that he is feeling better, but that he continues to have swelling in his legs. He is receiving lasix  40 mg daily. He has a negative 944 fluid balance for his first 24 hours.  Assessment and Plan: John [name] is a 68 y.o. male with medical history significant for chronic diastolic HF, A-flutter, HTN, CKD stage IIIB, EtOH cirrhosis with varices and ascites requiring serial paracentesis, EtOH abuse in remission, COPD, renal cell carcinoma s/p cryoablation, hepatitis C, seizure disorder, polysubstance abuse (cocaine/marijuana), anemia, thrombocytopenia, gout, PAD, CVA, and obesity who presented to the ED for evaluation of shortness of breath and abdominal distention and admitted for acute on chronic diastolic heart failure and abdominal ascites   # Acute on chronic diastolic heart failure - Last TTE in 04/2023 shows EF 55-60%, mild LVH, moderately dilated LA - Pt presented with progressive shortness of breath and lower extremity edema - Pt with clinical, laboratory and radiological signs of CHF  exacerbation - Start IV lasix  40 mg daily - Continue metoprolol  and spironolactone  - Strict I&O, daily weights - Maintain K+ > 4.0, Mag > 2.0 - Telemetry   # Abdominal ascites # Decompensated alcoholic cirrhosis with varices and thrombocytopenia - History of poor compliance but reports taking his medications daily - S/p large-volume paracentesis on admission with 5.4 L removed - Reports significant improvement in abdominal pain and distention - Based on body fluid cell count, patient does not meet criteria for SBP - Platelet improved from 105 a month ago to 114 - Continue IV Lasix , and oral spironolactone  - Trend LFTs and CBC   # HTN - BP slightly soft today but SBP in the 90s to 110s - Metoprolol  and spironolactone  have been continued. - Monitor blood pressures   # A-fib/A-flutter - HR mostly in the 70s to 90s - Not a candidate for anticoagulation - Continue on Toprol -XL   # CKD 3B - Creatinine of 1.86 around his baseline of 1.7-1.9 - Continue IV diuresing as above - Trend renal function - Avoid nephrotoxic agents   # Iron deficiency anemia - Hgb stable at 9.7 - Continue iron supplementation   # HLD - Continue atorvastatin    # COPD - Chronic and stable - Continue inhalers   # GERD - Continue Protonix    # Gout - Continue allopurinol    # Class II obesity Body mass index is 38.11 kg/m.    Filed Weights    10/15/23 1254  Weight: 103.9 kg  - F/u with PCP for weight lost and nutrition counseling     DVT prophylaxis: SCDs     Code Status: Full Code       Subjective: The patient is  resting quietly. No new complaints.  Physical Exam: Vitals:   10/16/23 1000 10/16/23 1026 10/16/23 1105 10/16/23 1501  BP: 106/87  107/80 99/74  Pulse: 65  90 85  Resp: 16  18 18   Temp:  98.4 F (36.9 C) 97.8 F (36.6 C) 98 F (36.7 C)  TempSrc:  Oral Oral Oral  SpO2: 100%  96% 98%  Weight:      Height:       Exam:  Constitutional:  The patient is awake,  alert, and oriented x 3. No acute distress. Respiratory:  No increased work of breathing. No wheezes, rales, or rhonchi No tactile fremitus Cardiovascular:  Regular rate and rhythm No murmurs, ectopy, or gallups. No lateral PMI. No thrills. Abdomen:  Abdomen is soft, non-tender, slightly distended No hernias, masses, or organomegaly Normoactive bowel sounds.  Musculoskeletal:  No cyanosis or clubbing +2 pitting edema bilaterally Skin:  No rashes, lesions, ulcers palpation of skin: no induration or nodules Neurologic:  CN 2-12 intact Sensation all 4 extremities intact Psychiatric:  Mental status Mood, affect appropriate Orientation to person, place, time  judgment and insight appear intact  Data Reviewed:  Vitals Body fluid cell count and chemistry CBC CMP  Family Communication: None available  Disposition: Status is: Inpatient Remains inpatient appropriate because: Need for continued diuresis with close monitoring of renal, electrolytes, and volume status.  Planned Discharge Destination: Home    Time spent: 35 minutes  Author: Arling Cerone, DO 10/16/2023 6:07 PM  For on call review [email or web address].

## 2023-10-16 NOTE — ED Notes (Signed)
Lav sent to lab

## 2023-10-17 ENCOUNTER — Encounter (HOSPITAL_COMMUNITY): Payer: Self-pay | Admitting: *Deleted

## 2023-10-17 ENCOUNTER — Other Ambulatory Visit (HOSPITAL_COMMUNITY): Payer: Self-pay

## 2023-10-17 DIAGNOSIS — I5033 Acute on chronic diastolic (congestive) heart failure: Secondary | ICD-10-CM | POA: Diagnosis not present

## 2023-10-17 LAB — CBC WITH DIFFERENTIAL/PLATELET
Abs Immature Granulocytes: 0.01 10*3/uL (ref 0.00–0.07)
Basophils Absolute: 0 10*3/uL (ref 0.0–0.1)
Basophils Relative: 0 %
Eosinophils Absolute: 0.1 10*3/uL (ref 0.0–0.5)
Eosinophils Relative: 2 %
HCT: 30.1 % — ABNORMAL LOW (ref 39.0–52.0)
Hemoglobin: 9.4 g/dL — ABNORMAL LOW (ref 13.0–17.0)
Immature Granulocytes: 0 %
Lymphocytes Relative: 10 %
Lymphs Abs: 0.4 10*3/uL — ABNORMAL LOW (ref 0.7–4.0)
MCH: 25.1 pg — ABNORMAL LOW (ref 26.0–34.0)
MCHC: 31.2 g/dL (ref 30.0–36.0)
MCV: 80.5 fL (ref 80.0–100.0)
Monocytes Absolute: 0.6 10*3/uL (ref 0.1–1.0)
Monocytes Relative: 13 %
Neutro Abs: 3.5 10*3/uL (ref 1.7–7.7)
Neutrophils Relative %: 75 %
Platelets: 118 10*3/uL — ABNORMAL LOW (ref 150–400)
RBC: 3.74 MIL/uL — ABNORMAL LOW (ref 4.22–5.81)
RDW: 17.4 % — ABNORMAL HIGH (ref 11.5–15.5)
WBC: 4.6 10*3/uL (ref 4.0–10.5)
nRBC: 0 % (ref 0.0–0.2)

## 2023-10-17 LAB — BASIC METABOLIC PANEL WITH GFR
Anion gap: 9 (ref 5–15)
BUN: 22 mg/dL (ref 8–23)
CO2: 25 mmol/L (ref 22–32)
Calcium: 8.5 mg/dL — ABNORMAL LOW (ref 8.9–10.3)
Chloride: 102 mmol/L (ref 98–111)
Creatinine, Ser: 1.77 mg/dL — ABNORMAL HIGH (ref 0.61–1.24)
GFR, Estimated: 42 mL/min — ABNORMAL LOW (ref >60)
Glucose, Bld: 104 mg/dL — ABNORMAL HIGH (ref 70–99)
Potassium: 5.3 mmol/L — ABNORMAL HIGH (ref 3.5–5.1)
Sodium: 136 mmol/L (ref 135–145)

## 2023-10-17 MED ORDER — FUROSEMIDE 20 MG PO TABS
20.0000 mg | ORAL_TABLET | Freq: Every day | ORAL | 11 refills | Status: DC
  Filled 2023-10-17: qty 30, 30d supply, fill #0

## 2023-10-17 NOTE — Progress Notes (Signed)
 Transition of Care [facility]) - Inpatient Brief Assessment  Patient Details  Name: John [name] MRN: [id number] Date of Birth: 1955/08/14  [facility]) CM/SW Contact:    Zenon Hilda, LCSW Phone Number: 10/17/2023, 12:18 PM  Clinical Narrative: Screening completed. No TOC needs identified at this time.  Transition of Care Asessment: Insurance and Status: Insurance coverage has been reviewed Patient has primary care physician: Yes Home environment has been reviewed: Resides alone in an apartment Prior level of function:: Independent with ADLs at baseline Prior/Current Home Services: Current home services (Aide through [facility]) Social Drivers of Health Review: SDOH reviewed no interventions necessary Readmission risk has been reviewed: Yes Transition of care needs: no transition of care needs at this time   10/17/23 1218  Readmission Prevention Plan - Extreme Risk  Transportation Screening Complete  Medication Review Complete  Home Care Consult Complete  Social Work consult for recovery care planning/counseling (includes patient and caregiver) Complete  Palliative Care Screening Not Applicable  Skilled Nursing Facility Not Applicable

## 2023-10-17 NOTE — Progress Notes (Signed)
 Discharge medications delivered to patient at bedside [facility]

## 2023-10-17 NOTE — Discharge Summary (Signed)
 Physician Discharge Summary   Patient: John [name] MRN: [id number] DOB: 1955-08-27  Admit date:     10/15/2023  Discharge date: 10/17/23  Discharge Physician: Junita Oliva   PCP: Sarrah Cure, DO   Recommendations at discharge:    Discharge to home. Follow up with PCP in 7-10 days. Have chemistry drawn at that visit. Results to be reported to PCP.  Discharge Diagnoses: Principal Problem:   Acute on chronic diastolic (congestive) heart failure (HCC) Active Problems:   Bilateral lower extremity edema Ascites Hepatic cirrhosis  Resolved Problems:   * No resolved hospital problems. [place] [facility] Course: The patient was admitted to a med surg bed. He underwent a paracentesis on 10/13/2023 that had no evidence of SBP. He has also been diuresed. His lower extremity edema has resolved. The patient will be discharged to home in fair condition.  Assessment and Plan: John [name] is a 68 y.o. male with medical history significant for chronic diastolic HF, A-flutter, HTN, CKD stage IIIB, EtOH cirrhosis with varices and ascites requiring serial paracentesis, EtOH abuse in remission, COPD, renal cell carcinoma s/p cryoablation, hepatitis C, seizure disorder, polysubstance abuse (cocaine/marijuana), anemia, thrombocytopenia, gout, PAD, CVA, and obesity who presented to the ED for evaluation of shortness of breath and abdominal distention and admitted for acute on chronic diastolic heart failure and abdominal ascites   # Acute on chronic diastolic heart failure - Last TTE in 04/2023 shows EF 55-60%, mild LVH, moderately dilated LA - Pt presented with progressive shortness of breath and lower extremity edema - Pt with clinical, laboratory and radiological signs of CHF exacerbation - Start IV lasix  40 mg daily - Continue metoprolol  and spironolactone  - Strict I&O, daily weights - Maintain K+ > 4.0, Mag > 2.0 - Telemetry   # Abdominal ascites # Decompensated alcoholic cirrhosis  with varices and thrombocytopenia - History of poor compliance but reports taking his medications daily - S/p large-volume paracentesis on admission with 5.4 L removed - Reports significant improvement in abdominal pain and distention - Based on body fluid cell count, patient does not meet criteria for SBP - Platelet improved from 105 a month ago to 114 - IV Lasix  has been converted to oral. Spironolactone  has been continued. - Trend LFTs and CBC   # HTN - BP improved with decreased lisinopril  - Metoprolol  and spironolactone  have been continued. - Monitor blood pressures   # A-fib/A-flutter - HR mostly in the 70s to 90s - Not a candidate for anticoagulation - Continue on Toprol -XL   # CKD 3B - Creatinine of 1.77 around his baseline of 1.7-1.9 - Continue lasix  PO on discharge. - Trend renal function - Avoid nephrotoxic agents   # Iron deficiency anemia - Hgb stable at 9.4 - Continue iron supplementation   # HLD - Continue atorvastatin    # COPD - Chronic and stable - Continue inhalers   # GERD - Continue Protonix    # Gout - Continue allopurinol    # Class II obesity Body mass index is 38.11 kg/m.      Filed Weights    10/15/23 1254  Weight: 103.9 kg  - F/u with PCP for weight lost and nutrition counseling  Consultants: Interventional radiology Procedures performed: Paracentesis  Disposition: Home Diet recommendation:  Discharge Diet Orders (From admission, onward)     Start     Ordered   10/17/23 0000  Diet - low sodium heart healthy        10/17/23 1154  Cardiac diet DISCHARGE MEDICATION: Allergies as of 10/17/2023       Reactions   Penicillins Other (See Comments)   Convulsions, Patient could not walk, childhood allergy         Medication List     STOP taking these medications    potassium chloride  10 MEQ tablet Commonly known as: KLOR-CON  M   terbinafine 1 % cream Commonly known as: LAMISIL   thiamine  100 MG  tablet Commonly known as: VITAMIN B1 Replaced by: thiamine  100 MG tablet       TAKE these medications    acetaminophen  325 MG tablet Commonly known as: TYLENOL  Take 650 mg by mouth every 8 (eight) hours as needed for mild pain (pain score 1-3) or moderate pain (pain score 4-6). Do not exceed 6 tabs per day.   allopurinol  100 MG tablet Commonly known as: ZYLOPRIM  Take 0.5 tablets (50 mg total) by mouth daily.   atorvastatin  40 MG tablet Commonly known as: LIPITOR Take 1 tablet (40 mg total) by mouth daily.   brimonidine  0.15 % ophthalmic solution Commonly known as: ALPHAGAN  Place 1 drop into both eyes 2 (two) times daily.   feeding supplement Liqd Take 237 mLs by mouth 2 (two) times daily between meals.   FeroSul 325 (65 Fe) MG tablet Generic drug: ferrous sulfate  Take 1 tablet (325 mg total) by mouth every Monday, Wednesday, and Friday.   Flovent  HFA 220 MCG/ACT inhaler Generic drug: fluticasone  Inhale 2 puffs into the lungs in the morning and at bedtime.   fluticasone  50 MCG/ACT nasal spray Commonly known as: FLONASE  Place 1 spray into both nostrils as needed.   folic acid  1 MG tablet Commonly known as: FOLVITE  Take 1 tablet (1 mg total) by mouth daily.   furosemide  40 MG tablet Commonly known as: LASIX  Take 1 tablet (40 mg total) by mouth daily.   hydroxypropyl methylcellulose / hypromellose 2.5 % ophthalmic solution Commonly known as: ISOPTO TEARS / GONIOVISC Place 1 drop into the right eye as needed for dry eyes.   ketoconazole  2 % shampoo Commonly known as: NIZORAL  Apply 1 Application topically See admin instructions. Shampoo the scalp 2 times a week   lactulose  10 GM/15ML solution Commonly known as: CHRONULAC  Take 30 mLs (20 g total) by mouth daily as needed for mild constipation.   melatonin 5 MG Tabs Take 1 tablet (5 mg total) by mouth at bedtime.   metoprolol  succinate 25 MG 24 hr tablet Commonly known as: Toprol  XL Take 0.5 tablets (12.5 mg  total) by mouth daily.   omeprazole  40 MG capsule Commonly known as: PRILOSEC Take 1 capsule (40 mg total) by mouth daily before breakfast.   oxyCODONE  5 MG immediate release tablet Commonly known as: Oxy IR/ROXICODONE  Take 1 tablet (5 mg total) by mouth every 8 (eight) hours as needed for severe pain (pain score 7-10).   ProAir  HFA 108 (90 Base) MCG/ACT inhaler Generic drug: albuterol  Inhale 2 puffs into the lungs every 6 (six) hours as needed for wheezing or shortness of breath.   Salonpas Pain Relief Patch Ptch Apply 1 patch topically See admin instructions. Apply one patch to the mid-back & one to the lower back in the morning and remove approx 12 hours later   spironolactone  50 MG tablet Commonly known as: ALDACTONE  Take 1 tablet (50 mg total) by mouth daily.   thiamine  100 MG tablet Commonly known as: VITAMIN B1 Take 1 tablet (100 mg total) by mouth daily. Replaces: thiamine  100 MG tablet  Follow-up Information     Reed, Tiffany L, DO. Call in 3 days.   Specialty: Geriatric Medicine Contact information: [address] [place] [address] [phone]                Discharge Exam: Filed Weights   10/15/23 1254  Weight: 103.9 kg   Exam:  Constitutional:  The patient is awake, alert, and oriented x 3. No acute distress. Respiratory:  No increased work of breathing. No wheezes, rales, or rhonchi No tactile fremitus Cardiovascular:  Regular rate and rhythm No murmurs, ectopy, or gallups. No lateral PMI. No thrills. Abdomen:  Abdomen is soft, non-tender, non-distended No hernias, masses, or organomegaly Normoactive bowel sounds.  Musculoskeletal:  No cyanosis, clubbing, or edema Skin:  No rashes, lesions, ulcers palpation of skin: no induration or nodules Neurologic:  CN 2-12 intact Sensation all 4 extremities intact Psychiatric:  Mental status Mood, affect appropriate Orientation to person, place, time  judgment and insight  appear intact   Condition at discharge: fair  The results of significant diagnostics from this hospitalization (including imaging, microbiology, ancillary and laboratory) are listed below for reference.   Imaging Studies: US  Paracentesis Result Date: 10/15/2023 INDICATION: 68 year old male with a history of CHF, alcoholic cirrhosis, with recurrent ascites. Requested for diagnostic and therapeutic paracentesis EXAM: ULTRASOUND GUIDED DIAGNOSTIC AND THERAPEUTIC PARACENTESIS MEDICATIONS: 7 cc of 1% lidocaine  COMPLICATIONS: None immediate. PROCEDURE: Informed written consent was obtained from the patient after a discussion of the risks, benefits and alternatives to treatment. A timeout was performed prior to the initiation of the procedure. Initial ultrasound scanning demonstrates a large amount of ascites within the right lower abdominal quadrant. The right lower abdomen was prepped and draped in the usual sterile fashion. 1% lidocaine  was used for local anesthesia. Following this, a 19 gauge, 7-cm, Yueh catheter was introduced. An ultrasound image was saved for documentation purposes. The paracentesis was performed. The catheter was removed and a dressing was applied. The patient tolerated the procedure well without immediate post procedural complication. Patient received post-procedure intravenous albumin ; see nursing notes for details. FINDINGS: A total of approximately 5.4 L of clear, amber colored peritoneal fluid was removed. Samples were sent to the laboratory as requested by the clinical team. IMPRESSION: Successful ultrasound-guided paracentesis yielding 5.4 liters of peritoneal fluid. Procedure performed by Lambert Pillion, PA-C Electronically Signed   By: Nicoletta Barrier M.D.   On: 10/15/2023 16:27   DG Chest Port 1 View Result Date: 10/15/2023 CLINICAL DATA:  Shortness of breath.  Mild hypoxia. EXAM: PORTABLE CHEST 1 VIEW COMPARISON:  09/04/2023 FINDINGS: Mildly enlarged cardiac silhouette with mild  improvement. Underpenetration of the left lung base with no gross airspace consolidation or pleural fluid seen. Clear right lung. Unremarkable bones. IMPRESSION: 1. Mild cardiomegaly with mild improvement. 2. Underpenetration of the left lung base with no gross airspace consolidation or pleural fluid seen. Electronically Signed   By: Catherin Closs M.D.   On: 10/15/2023 15:40    Microbiology: Results for orders placed or performed during the hospital encounter of 10/15/23  Body fluid culture w Gram Stain     Status: None (Preliminary result)   Collection Time: 10/15/23  3:51 PM   Specimen: PATH Cytology Peritoneal fluid  Result Value Ref Range Status   Specimen Description   Final    PERITONEAL Performed at [facility], [address], [place], [place] [address]    Special Requests   Final    NONE Performed at [facility], [address], [place], [place] [address]    Gram Stain NO WBC SEEN NO ORGANISMS SEEN   Final   Culture   Final    NO GROWTH 2 DAYS Performed at [facility], [address], [place], [place] [address]    Report Status PENDING  Incomplete    Labs: CBC: Recent Labs  Lab 10/15/23 1330 10/16/23 0639 10/17/23 0408  WBC 4.6 4.7 4.6  NEUTROABS 3.3  --  3.5  HGB 9.6* 9.7* 9.4*  HCT 31.6* 31.8* 30.1*  MCV 82.1 82.0 80.5  PLT 117* 114* 118*   Basic Metabolic Panel: Recent Labs  Lab 10/15/23 1330 10/16/23 0450 10/17/23 0408  NA 135 133* 136  K 5.0 4.9 5.3*  CL 103 103 102  CO2 25 23 25   GLUCOSE 84 88 104*  BUN 17 18 22   CREATININE 1.84* 1.86* 1.77*  CALCIUM  8.3* 7.8* 8.5*  MG  --  1.8  --   PHOS  --  3.0  --    Liver Function Tests: Recent Labs  Lab 10/15/23 1330 10/16/23 0450  AST 19 18  ALT 9 8  ALKPHOS 79 60  BILITOT 1.0 1.2  PROT 7.4 6.4*  ALBUMIN  2.9* 2.6*   CBG: No results for input(s): GLUCAP in the last 168 hours.  Discharge time spent: greater than 30 minutes.  Signed: Zaydah Nawabi, DO [facility] 10/17/2023

## 2023-10-19 LAB — BODY FLUID CULTURE W GRAM STAIN
Culture: NO GROWTH
Gram Stain: NONE SEEN

## 2023-10-20 ENCOUNTER — Emergency Department (HOSPITAL_COMMUNITY)
Admission: EM | Admit: 2023-10-20 | Discharge: 2023-10-20 | Disposition: A | Discharge status: Home / Self Care | Payer: Medicare (Managed Care) | Attending: Emergency Medicine | Admitting: Emergency Medicine

## 2023-10-20 ENCOUNTER — Emergency Department (HOSPITAL_COMMUNITY): Payer: Medicare (Managed Care)

## 2023-10-20 ENCOUNTER — Other Ambulatory Visit: Payer: Self-pay

## 2023-10-20 DIAGNOSIS — N189 Chronic kidney disease, unspecified: Secondary | ICD-10-CM | POA: Diagnosis not present

## 2023-10-20 DIAGNOSIS — R1032 Left lower quadrant pain: Secondary | ICD-10-CM | POA: Diagnosis present

## 2023-10-20 DIAGNOSIS — R0602 Shortness of breath: Secondary | ICD-10-CM | POA: Insufficient documentation

## 2023-10-20 DIAGNOSIS — K7031 Alcoholic cirrhosis of liver with ascites: Secondary | ICD-10-CM | POA: Insufficient documentation

## 2023-10-20 LAB — COMPREHENSIVE METABOLIC PANEL WITH GFR
ALT: 10 U/L (ref 0–44)
AST: 22 U/L (ref 15–41)
Albumin: 2.8 g/dL — ABNORMAL LOW (ref 3.5–5.0)
Alkaline Phosphatase: 63 U/L (ref 38–126)
Anion gap: 13 (ref 5–15)
BUN: 21 mg/dL (ref 8–23)
CO2: 24 mmol/L (ref 22–32)
Calcium: 8.9 mg/dL (ref 8.9–10.3)
Chloride: 100 mmol/L (ref 98–111)
Creatinine, Ser: 1.91 mg/dL — ABNORMAL HIGH (ref 0.61–1.24)
GFR, Estimated: 38 mL/min — ABNORMAL LOW (ref >60)
Glucose, Bld: 90 mg/dL (ref 70–99)
Potassium: 4.8 mmol/L (ref 3.5–5.1)
Sodium: 137 mmol/L (ref 135–145)
Total Bilirubin: 0.9 mg/dL (ref 0.0–1.2)
Total Protein: 7.1 g/dL (ref 6.5–8.1)

## 2023-10-20 LAB — CBC
HCT: 32.6 % — ABNORMAL LOW (ref 39.0–52.0)
Hemoglobin: 10.1 g/dL — ABNORMAL LOW (ref 13.0–17.0)
MCH: 24.9 pg — ABNORMAL LOW (ref 26.0–34.0)
MCHC: 31 g/dL (ref 30.0–36.0)
MCV: 80.3 fL (ref 80.0–100.0)
Platelets: 118 10*3/uL — ABNORMAL LOW (ref 150–400)
RBC: 4.06 MIL/uL — ABNORMAL LOW (ref 4.22–5.81)
RDW: 17.6 % — ABNORMAL HIGH (ref 11.5–15.5)
WBC: 4.8 10*3/uL (ref 4.0–10.5)
nRBC: 0 % (ref 0.0–0.2)

## 2023-10-20 LAB — PROTIME-INR
INR: 1.3 — ABNORMAL HIGH (ref 0.8–1.2)
Prothrombin Time: 16.4 s — ABNORMAL HIGH (ref 11.4–15.2)

## 2023-10-20 LAB — BRAIN NATRIURETIC PEPTIDE: B Natriuretic Peptide: 320.2 pg/mL — ABNORMAL HIGH (ref 0.0–100.0)

## 2023-10-20 MED ORDER — FUROSEMIDE 10 MG/ML IJ SOLN
40.0000 mg | Freq: Once | INTRAMUSCULAR | Status: AC
  Administered 2023-10-20: 40 mg via INTRAVENOUS
  Filled 2023-10-20: qty 4

## 2023-10-20 MED ORDER — LIDOCAINE-EPINEPHRINE 1 %-1:100000 IJ SOLN
10.0000 mL | Freq: Once | INTRAMUSCULAR | Status: DC
  Filled 2023-10-20: qty 1

## 2023-10-20 NOTE — ED Notes (Signed)
Patient transport to xray

## 2023-10-20 NOTE — ED Notes (Signed)
 CCMD called, pt on monitor

## 2023-10-20 NOTE — ED Provider Notes (Signed)
 [place] [facility] Provider Note   CSN: [id number] Arrival date & time: 10/20/23  1015     History Chief Complaint  Patient presents with   Shortness of Breath   Abdominal Pain    John [name] is a 68 y.o. male w/ PMHx alcoholic cirrhosis, HFpEF, CKD, esophageal varices who presents to the ED for evaluation of ascites.  Patient reports gradually worsening abdominal swelling.  Denies any abdominal pain.  He states he frequently has to get paracenteses.  He denies any fevers chills.  He reports slight difficulty breathing when laying flat.  He states he has been compliant with Lasix  except for this morning knowing he was going to come to the ED.   Physical Exam Updated Vital Signs BP (!) 131/120   Pulse (!) 116   Temp (!) 97.5 F (36.4 C)   Resp 12   Ht 5' 5 (1.651 m)   Wt 99.8 kg   SpO2 90%   BMI 36.61 kg/m  Physical Exam Vitals and nursing note reviewed.  Constitutional:      General: He is not in acute distress.    Appearance: He is well-developed.     Comments: Chronically ill-appearing   Eyes:     Conjunctiva/sclera: Conjunctivae normal.    Cardiovascular:     Rate and Rhythm: Normal rate and regular rhythm.     Heart sounds: No murmur heard. Pulmonary:     Effort: Pulmonary effort is normal. No respiratory distress.     Breath sounds: Normal breath sounds.  Abdominal:     General: Abdomen is protuberant.     Palpations: Abdomen is soft. There is fluid wave.     Tenderness: There is no abdominal tenderness.   Musculoskeletal:        General: No swelling.     Cervical back: Neck supple.     Right lower leg: Edema present.     Left lower leg: Edema present.   Skin:    General: Skin is warm and dry.     Capillary Refill: Capillary refill takes less than 2 seconds.   Neurological:     Mental Status: He is alert.   Psychiatric:        Mood and Affect: Mood normal.     ED Results / Procedures / Treatments    Labs (all labs ordered are listed, but only abnormal results are displayed) Labs Reviewed  BRAIN NATRIURETIC PEPTIDE - Abnormal; Notable for the following components:      Result Value   B Natriuretic Peptide 320.2 (*)    All other components within normal limits  COMPREHENSIVE METABOLIC PANEL WITH GFR - Abnormal; Notable for the following components:   Creatinine, Ser 1.91 (*)    Albumin  2.8 (*)    GFR, Estimated 38 (*)    All other components within normal limits  CBC - Abnormal; Notable for the following components:   RBC 4.06 (*)    Hemoglobin 10.1 (*)    HCT 32.6 (*)    MCH 24.9 (*)    RDW 17.6 (*)    Platelets 118 (*)    All other components within normal limits  PROTIME-INR - Abnormal; Notable for the following components:   Prothrombin Time 16.4 (*)    INR 1.3 (*)    All other components within normal limits    EKG None  Radiology DG Chest 2 View Result Date: 10/20/2023 CLINICAL DATA:  Shortness of breath. EXAM: CHEST - 2  VIEW COMPARISON:  Chest radiograph dated 10/15/2023 FINDINGS: No focal consolidation, pleural effusion or pneumothorax. Mild cardiomegaly. Enlargement of the pulmonary arteries at the hilum suggestive of pulmonary hypertension. No acute osseous pathology. IMPRESSION: 1. No active cardiopulmonary disease. 2. Mild cardiomegaly. Electronically Signed   By: Vanetta Chou M.D.   On: 10/20/2023 13:04    Medications Ordered in ED Medications  lidocaine -EPINEPHrine  (XYLOCAINE  W/EPI) 1 %-1:100000 (with pres) injection 10 mL (has no administration in time range)  furosemide  (LASIX ) injection 40 mg (40 mg Intravenous Given 10/20/23 1605)    ED Course/ Medical Decision Making/ A&P  John [name] is a 68 y.o. male presents as detailed above  Differential ddx: Ascites, cirrhosis, volume overload  On arrival, patient afebrile hemodynamically stable no hypoxia or respiratory distress.  Ascites.  ED Work-up: Please see details of labs and imaging  listed above. Evidence of volume overload.  Patient received 40 mg IV Lasix .  INR not significantly elevated.  No significant thrombocytopenia.  Will proceed with therapeutic paracentesis.  Approximately 3 L drained.  Patient tolerated procedure well.  CT also procedure note below. Patient observed for 1 hour following procedure.  Patient did not have any significant vital sign changes.  Patient stable for discharge outpatient follow-up    Overall impression ascites  Patient stable for discharge and outpatient follow-up. Strict return precautions provided. Patient voices understanding and agrees with plan.   Patient seen with supervising physician who agrees with plan.  Final Clinical Impression(s) / ED Diagnoses Final diagnoses:  Alcoholic cirrhosis of liver with ascites (HCC)   Paracentesis  Date/Time: 10/20/2023 8:20 PM  Performed by: Sherrine Birmingham, DO Authorized by: Patt Alm Macho, MD   Consent:    Consent obtained:  Verbal and written   Consent given by:  Patient   Risks, benefits, and alternatives were discussed: yes     Risks discussed:  Bleeding, bowel perforation, infection and pain   Alternatives discussed:  No treatment and delayed treatment Universal protocol:    Procedure explained and questions answered to patient or proxy's satisfaction: yes     Relevant documents present and verified: yes     Test results available: yes     Imaging studies available: yes     Required blood products, implants, devices, and special equipment available: yes     Site/side marked: yes     Immediately prior to procedure, a time out was called: yes     Patient identity confirmed:  Verbally with patient and arm band Pre-procedure details:    Procedure purpose:  Therapeutic   Preparation: Patient was prepped and draped in usual sterile fashion   Anesthesia:    Anesthesia method:  Local infiltration   Local anesthetic:  Lidocaine  1% WITH epi Procedure details:    Needle gauge:   20   Ultrasound guidance: no     Puncture site:  L lower quadrant   Fluid removed amount:  3L   Fluid appearance:  Amber   Dressing:  Adhesive bandage Post-procedure details:    Procedure completion:  Tolerated well, no immediate complications     B[place] Sherrine, DO PGY-3 Emergency Medicine    Sherrine Birmingham, DO 10/20/23 2232    Patt Alm Macho, MD 10/21/23 1944

## 2023-10-20 NOTE — ED Triage Notes (Signed)
 Patient presents to ed via [facility] from home states his abd and upper thighs are swelling for the past several days  states the last time he had fluid removed was 1 month ago and had 5 liters removed states he didn't take his lasix  this am. Patient is alert oriented no resp distress.

## 2023-10-20 NOTE — ED Provider Triage Note (Signed)
 Emergency Medicine Provider Triage Evaluation Note  John [name] , a 68 y.o. male  was evaluated in triage.  Pt complains of swelling in his abdomen and legs..  Review of Systems  Positive: Shortness of breath Negative: Fevers  Physical Exam  BP 106/79 (BP Location: Right Arm)   Pulse (!) 125   Temp 98.8 F (37.1 C) (Oral)   Resp 18   Ht 5' 5 (1.651 m)   Wt 99.8 kg   SpO2 96%   BMI 36.61 kg/m  Distended abdomen, peripheral edema.  Medical Decision Making  Medically screening exam initiated at 10:55 AM.  Appropriate orders placed.  John [name] was informed that the remainder of the evaluation will be completed by another provider, this initial triage assessment does not replace that evaluation, and the importance of remaining in the ED until their evaluation is complete.  Shortness of breath or swelling of abdomen and legs.  History of cirrhosis and CHF.  Will get x-ray and basic blood work.  Also tachycardic on vitals and will get EKG   John Lot, MD 10/20/23 1056

## 2023-10-20 NOTE — Discharge Instructions (Signed)
 Please call family medicine to establish care.  Please call gastroenterology to establish care.  Once established with these doctors you may be able to schedule outpatient paracentesis through interventional radiology instead of coming to the ER. Thank you for allowing us  to take care of you today.  We hope you begin feeling better soon.   To-Do:  Please follow-up with your primary doctor within the next 2-3 days. Please return to the Emergency Department or call 911 if you experience chest pain, shortness of breath, severe pain, severe fever, altered mental status, or have any reason to think that you need emergency medical care.  Thank you again.  Hope you feel better soon.  [name] Department of Emergency Medicine

## 2023-11-12 ENCOUNTER — Emergency Department (HOSPITAL_COMMUNITY): Payer: Medicare (Managed Care)

## 2023-11-12 ENCOUNTER — Encounter (HOSPITAL_COMMUNITY): Payer: Self-pay

## 2023-11-12 ENCOUNTER — Other Ambulatory Visit: Payer: Self-pay

## 2023-11-12 ENCOUNTER — Emergency Department (HOSPITAL_COMMUNITY)
Admission: EM | Admit: 2023-11-12 | Discharge: 2023-11-12 | Disposition: A | Discharge status: Home / Self Care | Payer: Medicare (Managed Care) | Attending: Emergency Medicine | Admitting: Emergency Medicine

## 2023-11-12 ENCOUNTER — Encounter (HOSPITAL_COMMUNITY): Payer: Self-pay | Admitting: *Deleted

## 2023-11-12 DIAGNOSIS — K7031 Alcoholic cirrhosis of liver with ascites: Secondary | ICD-10-CM | POA: Insufficient documentation

## 2023-11-12 DIAGNOSIS — D649 Anemia, unspecified: Secondary | ICD-10-CM | POA: Insufficient documentation

## 2023-11-12 DIAGNOSIS — R0602 Shortness of breath: Secondary | ICD-10-CM | POA: Diagnosis present

## 2023-11-12 DIAGNOSIS — R7989 Other specified abnormal findings of blood chemistry: Secondary | ICD-10-CM | POA: Diagnosis not present

## 2023-11-12 DIAGNOSIS — I110 Hypertensive heart disease with heart failure: Secondary | ICD-10-CM | POA: Insufficient documentation

## 2023-11-12 DIAGNOSIS — I509 Heart failure, unspecified: Secondary | ICD-10-CM | POA: Insufficient documentation

## 2023-11-12 DIAGNOSIS — Z79899 Other long term (current) drug therapy: Secondary | ICD-10-CM | POA: Insufficient documentation

## 2023-11-12 DIAGNOSIS — R1900 Intra-abdominal and pelvic swelling, mass and lump, unspecified site: Secondary | ICD-10-CM | POA: Diagnosis present

## 2023-11-12 LAB — COMPREHENSIVE METABOLIC PANEL WITH GFR
ALT: 8 U/L (ref 0–44)
AST: 20 U/L (ref 15–41)
Albumin: 3.1 g/dL — ABNORMAL LOW (ref 3.5–5.0)
Alkaline Phosphatase: 60 U/L (ref 38–126)
Anion gap: 8 (ref 5–15)
BUN: 13 mg/dL (ref 8–23)
CO2: 25 mmol/L (ref 22–32)
Calcium: 8.8 mg/dL — ABNORMAL LOW (ref 8.9–10.3)
Chloride: 107 mmol/L (ref 98–111)
Creatinine, Ser: 1.24 mg/dL (ref 0.61–1.24)
GFR, Estimated: >60 mL/min (ref >60)
Glucose, Bld: 81 mg/dL (ref 70–99)
Potassium: 3.6 mmol/L (ref 3.5–5.1)
Sodium: 140 mmol/L (ref 135–145)
Total Bilirubin: 1.3 mg/dL — ABNORMAL HIGH (ref 0.0–1.2)
Total Protein: 7.7 g/dL (ref 6.5–8.1)

## 2023-11-12 LAB — CBC
HCT: 33.4 % — ABNORMAL LOW (ref 39.0–52.0)
Hemoglobin: 10.3 g/dL — ABNORMAL LOW (ref 13.0–17.0)
MCH: 25.2 pg — ABNORMAL LOW (ref 26.0–34.0)
MCHC: 30.8 g/dL (ref 30.0–36.0)
MCV: 81.7 fL (ref 80.0–100.0)
Platelets: 113 K/uL — ABNORMAL LOW (ref 150–400)
RBC: 4.09 MIL/uL — ABNORMAL LOW (ref 4.22–5.81)
RDW: 17.9 % — ABNORMAL HIGH (ref 11.5–15.5)
WBC: 4.2 K/uL (ref 4.0–10.5)
nRBC: 0 % (ref 0.0–0.2)

## 2023-11-12 LAB — PROTIME-INR
INR: 1.2 (ref 0.8–1.2)
Prothrombin Time: 16.2 s — ABNORMAL HIGH (ref 11.4–15.2)

## 2023-11-12 LAB — BRAIN NATRIURETIC PEPTIDE: B Natriuretic Peptide: 256.6 pg/mL — ABNORMAL HIGH (ref 0.0–100.0)

## 2023-11-12 LAB — TROPONIN I (HIGH SENSITIVITY): Troponin I (High Sensitivity): 13 ng/L (ref <18)

## 2023-11-12 LAB — AMMONIA: Ammonia: 22 umol/L (ref 9–35)

## 2023-11-12 LAB — RESP PANEL BY RT-PCR (RSV, FLU A&B, COVID)  RVPGX2
Influenza A by PCR: NEGATIVE
Influenza B by PCR: NEGATIVE
Resp Syncytial Virus by PCR: NEGATIVE
SARS Coronavirus 2 by RT PCR: NEGATIVE

## 2023-11-12 MED ORDER — ALBUMIN HUMAN 25 % IV SOLN
50.0000 g | Freq: Once | INTRAVENOUS | Status: AC
  Administered 2023-11-12: 12.5 g via INTRAVENOUS
  Filled 2023-11-12: qty 200

## 2023-11-12 MED ORDER — FUROSEMIDE 10 MG/ML IJ SOLN
40.0000 mg | Freq: Once | INTRAMUSCULAR | Status: AC
  Administered 2023-11-12: 40 mg via INTRAVENOUS
  Filled 2023-11-12: qty 4

## 2023-11-12 MED ORDER — LIDOCAINE HCL 1 % IJ SOLN
INTRAMUSCULAR | Status: AC
  Filled 2023-11-12: qty 20

## 2023-11-12 MED ORDER — FUROSEMIDE 20 MG PO TABS
40.0000 mg | ORAL_TABLET | Freq: Every day | ORAL | 2 refills | Status: DC
  Filled 2023-11-12: qty 30, 15d supply, fill #0

## 2023-11-12 MED ORDER — OXYCODONE-ACETAMINOPHEN 5-325 MG PO TABS
1.0000 | ORAL_TABLET | Freq: Once | ORAL | Status: AC
  Administered 2023-11-12: 1 via ORAL
  Filled 2023-11-12: qty 1

## 2023-11-12 NOTE — Procedures (Signed)
 PROCEDURE SUMMARY:  Successful ultrasound guided therapeutic paracentesis from the right upper to mid quadrant.  Yielded 9 liters of slightly hazy, yellow fluid.  No immediate complications.  The patient tolerated the procedure well.     EBL < 2 cc  The patient has previously been formally evaluated by the [facility] and is being actively followed for potential future intervention.   Kevin Meko Bellanger,PA-C

## 2023-11-12 NOTE — ED Provider Notes (Signed)
 [place] EMERGENCY DEPARTMENT AT [facility] Provider Note   CSN: [id number] Arrival date & time: 11/12/23  1209     Patient presents with: Shortness of Breath   John [name] is a 68 y.o. male.  With past medical history of cirrhosis, CHF, hypertension, hyperlipidemia, polysubstance abuse, presents emergency room with complaint of shortness of breath, abdominal swelling, lower extremity swelling. Some wet cough with this. Patient reports that he typically takes Lasix  but has been out for 3 weeks and since then he has had gradual shortness of breath.  Reports he can only walk short distances around his apartment and has worsening shortness of breath when he is lying down flat.  Reports he thinks he needs another paracentesis and notes that his last one was 3 weeks ago 10/19/33, reports he does not know when next apt is. No fever, no significant chest or abdominal pain.     Shortness of Breath      Prior to Admission medications   Medication Sig Start Date End Date Taking? Authorizing Provider  acetaminophen  (TYLENOL ) 325 MG tablet Take 650 mg by mouth every 8 (eight) hours as needed for mild pain (pain score 1-3) or moderate pain (pain score 4-6). Do not exceed 6 tabs per day.    [provider]  allopurinol  (ZYLOPRIM ) 100 MG tablet Take 0.5 tablets (50 mg total) by mouth daily. 09/06/23 12/05/23  [place], Camellia PARAS, DO  atorvastatin  (LIPITOR) 40 MG tablet Take 1 tablet (40 mg total) by mouth daily. 09/06/23 12/05/23  [place], Camellia PARAS, DO  brimonidine  (ALPHAGAN ) 0.15 % ophthalmic solution Place 1 drop into both eyes 2 (two) times daily. 09/06/23   [place], Camellia PARAS, DO  feeding supplement (ENSURE ENLIVE / ENSURE PLUS) LIQD Take 237 mLs by mouth 2 (two) times daily between meals. 07/08/23   Jonel Lonni SQUIBB, MD  FEROSUL 325 (65 Fe) MG tablet Take 1 tablet (325 mg total) by mouth every Monday, Wednesday, and Friday. 09/08/23 12/07/23  [place], Eric J, DO  FLOVENT  HFA 220  MCG/ACT inhaler Inhale 2 puffs into the lungs in the morning and at bedtime. 09/06/23   [place], Camellia PARAS, DO  fluticasone  (FLONASE ) 50 MCG/ACT nasal spray Place 1 spray into both nostrils as needed.    [provider]  folic acid  (FOLVITE ) 1 MG tablet Take 1 tablet (1 mg total) by mouth daily. 10/15/23 01/15/24  Kingsley, Victoria K, DO  furosemide  (LASIX ) 40 MG tablet Take 1 tablet (40 mg total) by mouth daily. 10/15/23 01/15/24  Kingsley, Victoria K, DO  hydroxypropyl methylcellulose / hypromellose (ISOPTO TEARS / GONIOVISC) 2.5 % ophthalmic solution Place 1 drop into the right eye as needed for dry eyes.    [provider]  ketoconazole  (NIZORAL ) 2 % shampoo Apply 1 Application topically See admin instructions. Shampoo the scalp 2 times a week    [provider]  lactulose  (CHRONULAC ) 10 GM/15ML solution Take 30 mLs (20 g total) by mouth daily as needed for mild constipation. 10/15/23 01/13/24  Kingsley, Victoria K, DO  melatonin 5 MG TABS Take 1 tablet (5 mg total) by mouth at bedtime. 09/06/23 12/05/23  [place], Eric J, DO  Menthol -Methyl Salicylate  (SALONPAS  PAIN RELIEF  PATCH) PTCH Apply 1 patch topically See admin instructions. Apply one patch to the mid-back & one to the lower back in the morning and remove approx 12 hours later    [provider]  metoprolol  succinate (TOPROL  XL) 25 MG 24 hr tablet Take 0.5 tablets (12.5  mg total) by mouth daily. 10/15/23 01/15/24  Kingsley, Victoria K, DO  omeprazole  (PRILOSEC) 40 MG capsule Take 1 capsule (40 mg total) by mouth daily before breakfast. 10/15/23 01/15/24  Kingsley, Victoria K, DO  oxyCODONE  (OXY IR/ROXICODONE ) 5 MG immediate release tablet Take 1 tablet (5 mg total) by mouth every 8 (eight) hours as needed for severe pain (pain score 7-10). 09/06/23   [place], Camellia PARAS, DO  PROAIR  HFA 108 (90 Base) MCG/ACT inhaler Inhale 2 puffs into the lungs every 6 (six) hours as needed for wheezing or shortness of breath.    [provider]  spironolactone  (ALDACTONE ) 50 MG tablet Take 1 tablet (50 mg total) by mouth daily. 10/15/23 01/15/24  Kingsley, Victoria K, DO  thiamine  (VITAMIN B-1) 100 MG tablet Take 1 tablet (100 mg total) by mouth daily. 10/15/23 11/16/23  Kingsley, Victoria K, DO    Allergies: Penicillins    Review of Systems  Respiratory:  Positive for shortness of breath.     Updated Vital Signs BP (!) 123/93   Pulse 86   Temp 98 F (36.7 C) (Oral)   Resp (!) 25   Ht 5' 8 (1.727 m)   Wt 104.3 kg   SpO2 100%   BMI 34.97 kg/m   Physical Exam Vitals and nursing note reviewed.  Constitutional:      General: He is not in acute distress.    Appearance: He is not toxic-appearing.  HENT:     Head: Normocephalic and atraumatic.  Eyes:     General: No scleral icterus.    Conjunctiva/sclera: Conjunctivae normal.  Cardiovascular:     Rate and Rhythm: Normal rate and regular rhythm.     Pulses: Normal pulses.     Heart sounds: Normal heart sounds.  Pulmonary:     Effort: Pulmonary effort is normal. No respiratory distress.     Breath sounds: Rales present.  Abdominal:     General: Abdomen is flat. Bowel sounds are normal.     Palpations: Abdomen is soft. There is fluid wave.     Tenderness: There is no abdominal tenderness.  Musculoskeletal:     Right lower leg: Edema present.     Left lower leg: Edema present.  Skin:    General: Skin is warm and dry.     Findings: No lesion.  Neurological:     General: No focal deficit present.     Mental Status: He is alert and oriented to person, place, and time. Mental status is at baseline.     (all labs ordered are listed, but only abnormal results are displayed) Labs Reviewed  COMPREHENSIVE METABOLIC PANEL WITH GFR - Abnormal; Notable for the following components:      Result Value   Calcium  8.8 (*)    Albumin  3.1 (*)    Total Bilirubin 1.3 (*)    All other components within normal limits  CBC - Abnormal; Notable for the following  components:   RBC 4.09 (*)    Hemoglobin 10.3 (*)    HCT 33.4 (*)    MCH 25.2 (*)    RDW 17.9 (*)    Platelets 113 (*)    All other components within normal limits  BRAIN NATRIURETIC PEPTIDE - Abnormal; Notable for the following components:   B Natriuretic Peptide 256.6 (*)    All other components within normal limits  PROTIME-INR - Abnormal; Notable for the following components:   Prothrombin Time 16.2 (*)    All other components within normal  limits  RESP PANEL BY RT-PCR (RSV, FLU A&B, COVID)  [id number]  AMMONIA  TROPONIN I (HIGH SENSITIVITY)    EKG: None  Radiology: [facility] Chest Port 1 View Result Date: 11/12/2023 CLINICAL DATA:  Shortness of breath EXAM: PORTABLE CHEST 1 VIEW COMPARISON:  None Available. FINDINGS: Heart is enlarged. Low lung volumes. No pleural effusion, focal consolidation, or pneumothorax. Limited visualization of the retrocardiac region. IMPRESSION: 1. Enlarged heart with low lung volumes. Electronically Signed   By: Maude Naegeli M.D.   On: 11/12/2023 13:07     Procedures   Medications Ordered in the ED  furosemide  (LASIX ) injection 40 mg (40 mg Intravenous Given 11/12/23 1408)  albumin  human 25 % solution 50 g (12.5 g Intravenous New Bag/Given 11/12/23 1713)  oxyCODONE -acetaminophen  (PERCOCET/ROXICET) 5-325 MG per tablet 1 tablet (1 tablet Oral Given 11/12/23 1737)    Clinical Course as of 11/12/23 1945  Wed Nov 12, 2023  1617 Had paracentesis.  Reports he is feeling much better.  Vitals stable here. Will continue to obse for short period of time and OP f/u.  [JB]    Clinical Course User Index [JB] Lashonne Shull, Warren SAILOR, PA-C                                 Medical Decision Making Amount and/or Complexity of Data Reviewed Labs: ordered. Radiology: ordered.  Risk Prescription drug management.   This patient presents to the ED for concern of shortness of breath, this involves an extensive number of treatment options, and is a complaint that carries with  it a high risk of complications and morbidity.  The differential diagnosis includes ascites, CHF, ACS, pneumonia, pneumothorax, PE   Co morbidities that complicate the patient evaluation  Congestive heart failure Cirrhosis   Additional history obtained:  Additional history obtained from was recently seen here and had paracentesis performed in ER.   Lab Tests:  I personally interpreted labs.  The pertinent results include:   CBC without leukocytosis.  Hemoglobin is 10.3. CMP without significant electrolyte abnormality.  Total bilirubin is 1.5.  PTT 16.2, INR within normal limits. Ammonia within normal limits BNP is mildly elevated at 250   Imaging Studies ordered:  I ordered imaging studies including chest x-ray I independently visualized and interpreted imaging which showed enlarged heart and no congestion. I agree with the radiologist interpretation   Cardiac Monitoring: / EKG:  The patient was maintained on a cardiac monitor.    Consultations Obtained:  IR was consulted in the emergency room for ultrasound-guided paracentesis.  9 L were removed. Observed for several hours after and given albumin .   Problem List / ED Course / Critical interventions / Medication management  Patient reports to emergency room with complaint of abdominal distention and shortness of breath.  He has history of similar requiring paracentesis.  He has no sign of acute distress on exam and is not hypoxic.  He has had overall reassuring workup here without any significant abnormality in his PT INR, total bilirubin or ammonia from baseline.  He has no significant electrolyte abnormality.  Troponins are flat.  X-ray shows no infection or congestion.  He was given Lasix  here and had paracentesis with significant improvement in symptoms.  Given significant improvement in symptoms today he is appropriate for discharge with outpatient follow-up.   Plan  F/u w/ PCP in 2-3d to ensure resolution of sx.   Patient was given return precautions. Patient  stable for discharge at this time.  Patient educated on sx/dx and verbalized understanding of plan. Return to ER w/ new or worsening sx.       Final diagnoses:  Ascites due to alcoholic cirrhosis [facility])    ED Discharge Orders     None          Shermon Warren LOISE DEVONNA 11/12/23 1948    Laurice Maude BROCKS, MD 11/13/23 [phone]

## 2023-11-12 NOTE — ED Triage Notes (Signed)
 Patient BIB [facility] from home. Has been short of breath for 3 weeks. Ran out of lasix , has not had it the whole month of July. Stated he has too much fluid on his stomach and gets paracentesis.

## 2023-11-12 NOTE — Discharge Instructions (Addendum)
 Please follow-up with your primary care doctor your primary care doctor show to schedule outpatient paracentesis with [facility] radiology to help prevent you needed to come into the emergency room for this.  I have also refilled your Lasix  please take as prescribed.  Please return to ER with new or worsening symptoms.

## 2023-11-12 NOTE — ED Notes (Signed)
 Patient is calling niece for a ride.  Last bottle of albumin  running at this time.

## 2023-11-13 ENCOUNTER — Encounter (HOSPITAL_COMMUNITY): Payer: Self-pay | Admitting: *Deleted

## 2023-11-13 ENCOUNTER — Other Ambulatory Visit (HOSPITAL_COMMUNITY): Payer: Self-pay

## 2023-11-21 ENCOUNTER — Encounter (HOSPITAL_COMMUNITY): Payer: Self-pay | Admitting: *Deleted

## 2023-11-24 ENCOUNTER — Other Ambulatory Visit (HOSPITAL_COMMUNITY): Payer: Self-pay

## 2023-12-02 ENCOUNTER — Observation Stay (HOSPITAL_COMMUNITY)
Admission: EM | Admit: 2023-12-02 | Discharge: 2023-12-03 | Disposition: A | Discharge status: Home / Self Care | Attending: Emergency Medicine | Admitting: Emergency Medicine

## 2023-12-02 ENCOUNTER — Encounter (HOSPITAL_COMMUNITY): Payer: Self-pay

## 2023-12-02 ENCOUNTER — Emergency Department (HOSPITAL_COMMUNITY)

## 2023-12-02 ENCOUNTER — Encounter (HOSPITAL_COMMUNITY): Payer: Self-pay | Admitting: *Deleted

## 2023-12-02 ENCOUNTER — Other Ambulatory Visit: Payer: Self-pay

## 2023-12-02 DIAGNOSIS — N1831 Chronic kidney disease, stage 3a: Secondary | ICD-10-CM | POA: Diagnosis not present

## 2023-12-02 DIAGNOSIS — E785 Hyperlipidemia, unspecified: Secondary | ICD-10-CM | POA: Diagnosis not present

## 2023-12-02 DIAGNOSIS — Z87891 Personal history of nicotine dependence: Secondary | ICD-10-CM | POA: Diagnosis not present

## 2023-12-02 DIAGNOSIS — I5032 Chronic diastolic (congestive) heart failure: Secondary | ICD-10-CM | POA: Diagnosis not present

## 2023-12-02 DIAGNOSIS — Z79899 Other long term (current) drug therapy: Secondary | ICD-10-CM | POA: Diagnosis not present

## 2023-12-02 DIAGNOSIS — R188 Other ascites: Principal | ICD-10-CM | POA: Diagnosis present

## 2023-12-02 DIAGNOSIS — Z8679 Personal history of other diseases of the circulatory system: Secondary | ICD-10-CM | POA: Insufficient documentation

## 2023-12-02 DIAGNOSIS — K7031 Alcoholic cirrhosis of liver with ascites: Secondary | ICD-10-CM | POA: Diagnosis not present

## 2023-12-02 DIAGNOSIS — Z85528 Personal history of other malignant neoplasm of kidney: Secondary | ICD-10-CM | POA: Diagnosis not present

## 2023-12-02 DIAGNOSIS — R109 Unspecified abdominal pain: Secondary | ICD-10-CM | POA: Diagnosis present

## 2023-12-02 DIAGNOSIS — D500 Iron deficiency anemia secondary to blood loss (chronic): Secondary | ICD-10-CM | POA: Diagnosis not present

## 2023-12-02 DIAGNOSIS — I130 Hypertensive heart and chronic kidney disease with heart failure and stage 1 through stage 4 chronic kidney disease, or unspecified chronic kidney disease: Secondary | ICD-10-CM | POA: Diagnosis not present

## 2023-12-02 DIAGNOSIS — K219 Gastro-esophageal reflux disease without esophagitis: Secondary | ICD-10-CM | POA: Diagnosis not present

## 2023-12-02 DIAGNOSIS — I4892 Unspecified atrial flutter: Secondary | ICD-10-CM | POA: Insufficient documentation

## 2023-12-02 DIAGNOSIS — J45909 Unspecified asthma, uncomplicated: Secondary | ICD-10-CM | POA: Diagnosis not present

## 2023-12-02 DIAGNOSIS — Z8673 Personal history of transient ischemic attack (TIA), and cerebral infarction without residual deficits: Secondary | ICD-10-CM | POA: Diagnosis not present

## 2023-12-02 LAB — COMPREHENSIVE METABOLIC PANEL WITH GFR
ALT: 10 U/L (ref 0–44)
AST: 21 U/L (ref 15–41)
Albumin: 3 g/dL — ABNORMAL LOW (ref 3.5–5.0)
Alkaline Phosphatase: 72 U/L (ref 38–126)
Anion gap: 7 (ref 5–15)
BUN: 15 mg/dL (ref 8–23)
CO2: 26 mmol/L (ref 22–32)
Calcium: 8.7 mg/dL — ABNORMAL LOW (ref 8.9–10.3)
Chloride: 104 mmol/L (ref 98–111)
Creatinine, Ser: 1.45 mg/dL — ABNORMAL HIGH (ref 0.61–1.24)
GFR, Estimated: 53 mL/min — ABNORMAL LOW (ref >60)
Glucose, Bld: 82 mg/dL (ref 70–99)
Potassium: 4.3 mmol/L (ref 3.5–5.1)
Sodium: 137 mmol/L (ref 135–145)
Total Bilirubin: 1.1 mg/dL (ref 0.0–1.2)
Total Protein: 7.4 g/dL (ref 6.5–8.1)

## 2023-12-02 LAB — URINALYSIS, ROUTINE W REFLEX MICROSCOPIC
Bilirubin Urine: NEGATIVE
Glucose, UA: NEGATIVE mg/dL
Hgb urine dipstick: NEGATIVE
Ketones, ur: NEGATIVE mg/dL
Leukocytes,Ua: NEGATIVE
Nitrite: NEGATIVE
Protein, ur: NEGATIVE mg/dL
Specific Gravity, Urine: 1.006 (ref 1.005–1.030)
pH: 5 (ref 5.0–8.0)

## 2023-12-02 LAB — LIPASE, BLOOD: Lipase: 35 U/L (ref 11–51)

## 2023-12-02 LAB — CBC
HCT: 32.7 % — ABNORMAL LOW (ref 39.0–52.0)
Hemoglobin: 9.8 g/dL — ABNORMAL LOW (ref 13.0–17.0)
MCH: 24.3 pg — ABNORMAL LOW (ref 26.0–34.0)
MCHC: 30 g/dL (ref 30.0–36.0)
MCV: 81.1 fL (ref 80.0–100.0)
Platelets: 120 K/uL — ABNORMAL LOW (ref 150–400)
RBC: 4.03 MIL/uL — ABNORMAL LOW (ref 4.22–5.81)
RDW: 17.5 % — ABNORMAL HIGH (ref 11.5–15.5)
WBC: 4.6 K/uL (ref 4.0–10.5)
nRBC: 0 % (ref 0.0–0.2)

## 2023-12-02 LAB — PROTIME-INR
INR: 1.3 — ABNORMAL HIGH (ref 0.8–1.2)
Prothrombin Time: 16.5 s — ABNORMAL HIGH (ref 11.4–15.2)

## 2023-12-02 LAB — AMMONIA: Ammonia: 20 umol/L (ref 9–35)

## 2023-12-02 MED ORDER — FUROSEMIDE 40 MG PO TABS
40.0000 mg | ORAL_TABLET | Freq: Every day | ORAL | Status: DC
  Administered 2023-12-03: 40 mg via ORAL
  Filled 2023-12-02: qty 1

## 2023-12-02 MED ORDER — ONDANSETRON HCL 4 MG/2ML IJ SOLN: 4.0000 mg | Freq: Four times a day (QID) | INTRAMUSCULAR | Status: DC | PRN

## 2023-12-02 MED ORDER — TRAZODONE HCL 50 MG PO TABS: 25.0000 mg | ORAL_TABLET | Freq: Every evening | ORAL | Status: DC | PRN

## 2023-12-02 MED ORDER — BUDESONIDE 0.5 MG/2ML IN SUSP
1.0000 mg | Freq: Two times a day (BID) | RESPIRATORY_TRACT | Status: DC
  Administered 2023-12-02 – 2023-12-03 (×2): 1 mg via RESPIRATORY_TRACT
  Filled 2023-12-02 (×3): qty 4

## 2023-12-02 MED ORDER — ENSURE ENLIVE PO LIQD
237.0000 mL | Freq: Two times a day (BID) | ORAL | Status: DC
  Administered 2023-12-03 (×2): 237 mL via ORAL
  Filled 2023-12-02 (×2): qty 237

## 2023-12-02 MED ORDER — METOPROLOL SUCCINATE ER 25 MG PO TB24
12.5000 mg | ORAL_TABLET | Freq: Every day | ORAL | Status: DC
Start: 2023-12-02 — End: 2023-12-03
  Administered 2023-12-02 – 2023-12-03 (×2): 12.5 mg via ORAL
  Filled 2023-12-02 (×2): qty 1

## 2023-12-02 MED ORDER — FLUTICASONE PROPIONATE 50 MCG/ACT NA SUSP: 1.0000 | NASAL | Status: DC | PRN

## 2023-12-02 MED ORDER — FLUTICASONE PROPIONATE HFA 220 MCG/ACT IN AERO: 2.0000 | INHALATION_SPRAY | Freq: Two times a day (BID) | RESPIRATORY_TRACT | Status: DC

## 2023-12-02 MED ORDER — PANTOPRAZOLE SODIUM 40 MG PO TBEC
40.0000 mg | DELAYED_RELEASE_TABLET | Freq: Every day | ORAL | Status: DC
  Administered 2023-12-02 – 2023-12-03 (×2): 40 mg via ORAL
  Filled 2023-12-02 (×2): qty 1

## 2023-12-02 MED ORDER — FUROSEMIDE 10 MG/ML IJ SOLN
40.0000 mg | Freq: Once | INTRAMUSCULAR | Status: AC
  Administered 2023-12-02: 40 mg via INTRAVENOUS
  Filled 2023-12-02: qty 4

## 2023-12-02 MED ORDER — IBUPROFEN 200 MG PO TABS: 400.0000 mg | ORAL_TABLET | Freq: Four times a day (QID) | ORAL | Status: DC | PRN

## 2023-12-02 MED ORDER — SPIRONOLACTONE 25 MG PO TABS
50.0000 mg | ORAL_TABLET | Freq: Every day | ORAL | Status: DC
  Administered 2023-12-02 – 2023-12-03 (×2): 50 mg via ORAL
  Filled 2023-12-02 (×2): qty 2

## 2023-12-02 MED ORDER — ALBUTEROL SULFATE (2.5 MG/3ML) 0.083% IN NEBU: 2.5000 mg | INHALATION_SOLUTION | RESPIRATORY_TRACT | Status: DC | PRN

## 2023-12-02 MED ORDER — ATORVASTATIN CALCIUM 40 MG PO TABS
40.0000 mg | ORAL_TABLET | Freq: Every day | ORAL | Status: DC
  Administered 2023-12-02 – 2023-12-03 (×2): 40 mg via ORAL
  Filled 2023-12-02 (×2): qty 1

## 2023-12-02 MED ORDER — ONDANSETRON HCL 4 MG PO TABS: 4.0000 mg | ORAL_TABLET | Freq: Four times a day (QID) | ORAL | Status: DC | PRN

## 2023-12-02 MED ORDER — OXYCODONE HCL 5 MG PO TABS
5.0000 mg | ORAL_TABLET | Freq: Three times a day (TID) | ORAL | Status: DC | PRN
  Administered 2023-12-03: 5 mg via ORAL
  Filled 2023-12-02: qty 1

## 2023-12-02 NOTE — ED Triage Notes (Signed)
 Pt BIB EMS from home for fluid buildup. Pt had fluid removed last week but hasn't been taking his medications this week because he ran out.   Hx liver cirrhosis

## 2023-12-02 NOTE — H&P (Signed)
 History and Physical  JOHNERIC MCFADDEN FMW:[id number] DOB: 1955-05-31 DOA: 12/02/2023  PCP: Cloria Annabella CROME, DO   Chief Complaint: Fluid buildup  HPI: COSIMO SCHERTZER is a 68 y.o. male with medical history significant for chronic diastolic congestive heart failure, CKD stage III, alcoholic cirrhosis requiring intermittent large-volume paracentesis being admitted to the hospital with recurrent paracentesis in the setting of running out of his medications about 1 week ago.  Patient denies abdominal pain, fevers, nausea, vomiting.  States that overall he is compliant with his medication but ran out about a week ago.  In that timeframe, he started to develop worsening abdominal discomfort and swelling.  Review of Systems: Please see HPI for pertinent positives and negatives. A complete 10 system review of systems are otherwise negative.  Past Medical History:  Diagnosis Date   Allergy    Arthritis    oa back   Asthma    CAP (community acquired pneumonia) 05/23/2015   Cataract    both eyes   CHF (congestive heart failure) (HCC) 2001   sees primary for   Chronic back pain    Chronic kidney disease    ckd 3   Cirrhosis (HCC)    alcoholic with h/o varices, ascites   Coronary artery disease    nonobstrucrtibe   ETOH abuse    GERD (gastroesophageal reflux disease)    Gout    Hepatitis    hepatitic c 2018 24 week tx with ecuplipsa, no hepatitic c detected after tx   History of blood transfusion 8-9- yrs ago   Hypertension    Optic neuropathy, left    PAD (peripheral artery disease) (HCC)    slight  primary manages   Polysubstance abuse (HCC)    Primary hypertension    Renal cell carcinoma (HCC) 2016, 2018 and 2022   left ablation   Seizures [facility])    age 53 none since   Sickle cell trait (HCC)    Stroke (HCC) 1998   light no problems with   Uses walker 12/04/2020   Wears dentures    Wears glasses    Past Surgical History:  Procedure Laterality Date   ARTHRODESIS  METATARSALPHALANGEAL JOINT (MTPJ) Right 12/07/2020   Procedure: ARTHRODESIS METATARSALPHALANGEAL JOINT (MTPJ);  Surgeon: Janit Thresa HERO, DPM;  Location:  [place];  Service: Podiatry;  Laterality: Right;   COLONOSCOPY  2021   ESOPHAGOGASTRODUODENOSCOPY N/A 08/14/2014   Procedure: ESOPHAGOGASTRODUODENOSCOPY (EGD);  Surgeon: Gwendlyn ONEIDA Buddy, MD;  Location: [facility];  Service: Endoscopy;  Laterality: N/A;   ESOPHAGOGASTRODUODENOSCOPY (EGD) WITH PROPOFOL  N/A 06/03/2017   Procedure: ESOPHAGOGASTRODUODENOSCOPY (EGD) WITH PROPOFOL ;  Surgeon: Buddy Gwendlyn ONEIDA, MD;  Location: [facility];  Service: Endoscopy;  Laterality: N/A;   HALLUX FUSION Left 07/28/2020   Procedure: HALLUX FUSION MPJ;  Surgeon: Janit Thresa HERO, DPM;  Location: [facility] [place];  Service: Podiatry;  Laterality: Left;   HAMMER TOE SURGERY Left 07/28/2020   Procedure: HAMMER TOE CORRECTION  2,3,AND4 LEFT FOOT;  Surgeon: Janit Thresa HERO, DPM;  Location: [facility];  Service: Podiatry;  Laterality: Left;   HAMMER TOE SURGERY Right 12/07/2020   Procedure: HAMMER TOE CORRECTION  2-4;  Surgeon: Janit Thresa HERO, DPM;  Location: [facility] ;  Service: Podiatry;  Laterality: Right;   IR PARACENTESIS  09/05/2023   IR PARACENTESIS  09/16/2023   IR RADIOLOGIST EVAL & MGMT  09/25/2016   IR RADIOLOGIST EVAL & MGMT  12/10/2016   IR RADIOLOGIST EVAL & MGMT  03/25/2018   IR RADIOLOGIST EVAL & MGMT  03/24/2019   IR RADIOLOGIST EVAL & MGMT  05/17/2020   IR RADIOLOGIST EVAL & MGMT  08/03/2020   IR RADIOLOGIST EVAL & MGMT  11/22/2020   IR RADIOLOGIST EVAL & MGMT  05/31/2021   METATARSAL HEAD EXCISION Left 07/28/2020   Procedure: METATARSAL HEAD EXCISION TOES 2,3,AND 4  LEFT FOOT;  Surgeon: Janit Thresa HERO, DPM;  Location: [facility];  Service: Podiatry;  Laterality: Left;   METATARSAL OSTEOTOMY Right 12/07/2020   Procedure: METATARSAL OSTEOTOMY TOES 2-4 RIGHT FOOT;  Surgeon: Janit Thresa HERO, DPM;  Location: [facility];  Service: Podiatry;  Laterality: Right;   none     RADIOFREQUENCY ABLATION Left 11/08/2016   Procedure: LEFT RENAL CRYOABLATION;  Surgeon: Vanice Sharper, MD;  Location: [facility];  Service: Anesthesiology;  Laterality: Left;   RADIOLOGY WITH ANESTHESIA N/A 08/15/2014   Procedure: RADIOLOGY WITH ANESTHESIA;  Surgeon: Sharper Vanice, MD;  Location: [facility];  Service: Radiology;  Laterality: N/A;   RADIOLOGY WITH ANESTHESIA Left 07/05/2020   Procedure: CT CRYOABLATION;  Surgeon: Vanice Sharper, MD;  Location: [facility];  Service: Anesthesiology;  Laterality: Left;   UPPER GASTROINTESTINAL ENDOSCOPY     Social History:  reports that he quit smoking about 12 years ago. His smoking use included cigarettes. He started smoking about 32 years ago. He has a 6 pack-year smoking history. He has never used smokeless tobacco. He reports current drug use. Drugs: Cocaine and Marijuana. He reports that he does not drink alcohol.  Allergies  Allergen Reactions   Penicillins Other (See Comments)    Convulsions, Patient could not walk, childhood allergy     Family History  Problem Relation Age of Onset   Hypertension Mother        Living   Kidney disease Mother    Diabetes Mother    Heart disease Mother    Hypertension Father        Deceased, 64   Ulcers Father    Stomach cancer Father    Hypertension Brother    Diabetes Brother    Kidney disease Brother    Hypertension Sister    Colon cancer Neg Hx    Esophageal cancer Neg Hx    Rectal cancer Neg Hx      Prior to Admission medications   Medication Sig Start Date End Date Taking? Authorizing Provider  acetaminophen  (TYLENOL ) 325 MG tablet Take 650 mg by mouth every 8 (eight) hours as needed for mild pain (pain score 1-3) or moderate pain (pain score 4-6). Do not exceed 6 tabs per day.    [provider]  allopurinol  (ZYLOPRIM ) 100 MG tablet Take 0.5 tablets (50 mg total) by mouth daily.  09/06/23 12/05/23  [place], Camellia PARAS, DO  atorvastatin  (LIPITOR) 40 MG tablet Take 1 tablet (40 mg total) by mouth daily. 09/06/23 12/05/23  [place], Eric J, DO  brimonidine  (ALPHAGAN ) 0.15 % ophthalmic solution Place 1 drop into both eyes 2 (two) times daily. 09/06/23   [place], Camellia PARAS, DO  feeding supplement (ENSURE ENLIVE / ENSURE PLUS) LIQD Take 237 mLs by mouth 2 (two) times daily between meals. 07/08/23   Jonel Lonni SQUIBB, MD  FEROSUL 325 (65 Fe) MG tablet Take 1 tablet (325 mg total) by mouth every Monday, Wednesday, and Friday. 09/08/23 12/07/23  [place], Camellia PARAS, DO  FLOVENT  HFA 220 MCG/ACT inhaler Inhale 2 puffs into the lungs in the morning and at bedtime. 09/06/23  [place], Camellia PARAS, DO  fluticasone  (FLONASE ) 50 MCG/ACT nasal spray Place 1 spray into both nostrils as needed.    [provider]  folic acid  (FOLVITE ) 1 MG tablet Take 1 tablet (1 mg total) by mouth daily. 10/15/23 01/15/24  Kingsley, Victoria K, DO  furosemide  (LASIX ) 20 MG tablet Take 2 tablets (40 mg total) by mouth daily. 11/12/23   Barrett, Warren SAILOR, PA-C  hydroxypropyl methylcellulose / hypromellose (ISOPTO TEARS / GONIOVISC) 2.5 % ophthalmic solution Place 1 drop into the right eye as needed for dry eyes.    [provider]  ketoconazole  (NIZORAL ) 2 % shampoo Apply 1 Application topically See admin instructions. Shampoo the scalp 2 times a week    [provider]  lactulose  (CHRONULAC ) 10 GM/15ML solution Take 30 mLs (20 g total) by mouth daily as needed for mild constipation. 10/15/23 01/13/24  Kingsley, Victoria K, DO  melatonin 5 MG TABS Take 1 tablet (5 mg total) by mouth at bedtime. 09/06/23 12/05/23  [place], Eric J, DO  Menthol -Methyl Salicylate  (SALONPAS  PAIN RELIEF  PATCH) PTCH Apply 1 patch topically See admin instructions. Apply one patch to the mid-back & one to the lower back in the morning and remove approx 12 hours later    [provider]  metoprolol  succinate (TOPROL  XL) 25 MG 24 hr  tablet Take 0.5 tablets (12.5 mg total) by mouth daily. 10/15/23 01/15/24  Kingsley, Victoria K, DO  omeprazole  (PRILOSEC) 40 MG capsule Take 1 capsule (40 mg total) by mouth daily before breakfast. 10/15/23 01/15/24  Kingsley, Victoria K, DO  oxyCODONE  (OXY IR/ROXICODONE ) 5 MG immediate release tablet Take 1 tablet (5 mg total) by mouth every 8 (eight) hours as needed for severe pain (pain score 7-10). 09/06/23   [place], Camellia PARAS, DO  PROAIR  HFA 108 (90 Base) MCG/ACT inhaler Inhale 2 puffs into the lungs every 6 (six) hours as needed for wheezing or shortness of breath.    [provider]  spironolactone  (ALDACTONE ) 50 MG tablet Take 1 tablet (50 mg total) by mouth daily. 10/15/23 01/15/24  Ellouise Richerd POUR, DO    Physical Exam: BP (!) 119/91 (BP Location: Left Arm)   Pulse 73   Temp 97.7 F (36.5 C) (Oral)   Resp 17   SpO2 100%  General:  Alert, oriented, calm, in no acute distress  Eyes: EOMI, clear conjuctivae, white sclerea Neck: supple, no masses, trachea mildline  Cardiovascular: RRR, no murmurs or rubs, no peripheral edema  Respiratory: clear to auscultation bilaterally, no wheezes, no crackles  Abdomen: soft, nontender, massively distended with fluid wave, normal bowel tones heard  Skin: dry, no rashes  Musculoskeletal: no joint effusions, normal range of motion  Psychiatric: appropriate affect, normal speech  Neurologic: extraocular muscles intact, clear speech, moving all extremities with intact sensorium         Labs on Admission:  Basic Metabolic Panel: Recent Labs  Lab 12/02/23 1527  NA 137  K 4.3  CL 104  CO2 26  GLUCOSE 82  BUN 15  CREATININE 1.45*  CALCIUM  8.7*   Liver Function Tests: Recent Labs  Lab 12/02/23 1527  AST 21  ALT 10  ALKPHOS 72  BILITOT 1.1  PROT 7.4  ALBUMIN  3.0*   Recent Labs  Lab 12/02/23 1527  LIPASE 35   Recent Labs  Lab 12/02/23 1611  AMMONIA 20   CBC: Recent Labs  Lab 12/02/23 1527  WBC 4.6  HGB 9.8*  HCT  32.7*  MCV 81.1  PLT  120*   Cardiac Enzymes: No results for input(s): CKTOTAL, CKMB, CKMBINDEX, TROPONINI in the last 168 hours. BNP (last 3 results) Recent Labs    10/15/23 1330 10/20/23 1605 11/12/23 1304  BNP 273.0* 320.2* 256.6*    ProBNP (last 3 results) Recent Labs    09/04/23 1048  PROBNP 2,814.0*    CBG: No results for input(s): GLUCAP in the last 168 hours.  Radiological Exams on Admission: DG Chest Portable 1 View Result Date: 12/02/2023 EXAM: 1 VIEW XRAY OF THE CHEST 12/02/2023 03:46:00 PM COMPARISON: Chest radiograph dated 11/12/2023 end of report cervical . CLINICAL HISTORY: Worsening ascites, shortness of breath. FINDINGS: LUNGS AND PLEURA: No focal pulmonary opacity. Chronic-appearing pulmonary vascular congestion. No pulmonary edema. No pleural effusion. No pneumothorax. HEART AND MEDIASTINUM: Similarly enlarged cardiomediastinal silhouette compared to prior study. BONES AND SOFT TISSUES: No acute osseous abnormality. IMPRESSION: 1. No acute findings. 2. Unchanged cardiomegaly. Electronically signed by: Manford Cummins MD 12/02/2023 04:01 PM EDT RP Workstation: [id number]   Assessment/Plan YORDIN RHODA is a 68 y.o. male with medical history significant for chronic diastolic congestive heart failure, CKD stage III, alcoholic cirrhosis requiring intermittent large-volume paracentesis being admitted to the hospital with recurrent paracentesis in the setting of running out of his medications about 1 week ago.   Recurrent abdominal ascites-in the setting of known history of alcoholic liver cirrhosis, and medication noncompliance.  He last had 9 L paracentesis with IR on 7/17 -Observation admission -Patient was given a dose of IV Lasix  in the emergency department -Continue home Aldactone , and Lasix  in the morning  Chronic diastolic congestive heart failure-patient seems moderately volume overloaded on exam, but mainly has large volume ascites -Given a dose of  IV Lasix  in the emergency department -Continue Toprol -XL -Continue daily Lasix   Atrial fibrillation/flutter-rates well-controlled, not a candidate for anticoagulation -Continue Toprol -XL  CKD stage III-renal function appears to be at baseline  GERD-omeprazole   Iron deficiency anemia-hemoglobin stable  Hyperlipidemia-atorvastatin   DVT prophylaxis: SCDs only    Code Status: Full Code  Consults called: ER provider discussed with IR, orders placed for IR paracentesis in the morning  Admission status: Observation  Time spent: 49 minutes  Taha Dimond CHRISTELLA Gail MD [facility] Pager [phone]  If 7PM-7AM, please contact night-coverage [email or web address] Password [email or web address]  12/02/2023, 5:42 PM

## 2023-12-02 NOTE — ED Provider Notes (Signed)
 [place] EMERGENCY DEPARTMENT AT [facility] Provider Note   CSN: [id number] Arrival date & time: 12/02/23  1427     Patient presents with: Abdominal Pain   John [name] is a 68 y.o. male.   68 year old male here for worsening ascites.  The patient last had a paracentesis on 7/16 in the emergency department, he also required IV diuresis with Lasix .  He tells me that he is out of basically all of his medications, he is unable to tell me how long he has been out of these medications.  He endorses worsening shortness of breath secondary to fluid buildup in his abdomen.  Denies chest pain, lower extremity edema, fever.  He does note that his lower extremities tend to leak, this is not new.   Abdominal Pain      Prior to Admission medications   Medication Sig Start Date End Date Taking? Authorizing Provider  acetaminophen  (TYLENOL ) 325 MG tablet Take 650 mg by mouth every 8 (eight) hours as needed for mild pain (pain score 1-3) or moderate pain (pain score 4-6). Do not exceed 6 tabs per day.    [provider]  allopurinol  (ZYLOPRIM ) 100 MG tablet Take 0.5 tablets (50 mg total) by mouth daily. 09/06/23 12/05/23  [place], Camellia PARAS, DO  atorvastatin  (LIPITOR) 40 MG tablet Take 1 tablet (40 mg total) by mouth daily. 09/06/23 12/05/23  [place], Eric J, DO  brimonidine  (ALPHAGAN ) 0.15 % ophthalmic solution Place 1 drop into both eyes 2 (two) times daily. 09/06/23   [place], Camellia PARAS, DO  feeding supplement (ENSURE ENLIVE / ENSURE PLUS) LIQD Take 237 mLs by mouth 2 (two) times daily between meals. 07/08/23   John Lonni SQUIBB, MD  FEROSUL 325 (65 Fe) MG tablet Take 1 tablet (325 mg total) by mouth every Monday, Wednesday, and Friday. 09/08/23 12/07/23  [place], Camellia PARAS, DO  FLOVENT  HFA 220 MCG/ACT inhaler Inhale 2 puffs into the lungs in the morning and at bedtime. 09/06/23   [place], Camellia PARAS, DO  fluticasone  (FLONASE ) 50 MCG/ACT nasal spray Place 1 spray into both nostrils as  needed.    [provider]  folic acid  (FOLVITE ) 1 MG tablet Take 1 tablet (1 mg total) by mouth daily. 10/15/23 01/15/24  Kingsley, Victoria K, DO  furosemide  (LASIX ) 20 MG tablet Take 2 tablets (40 mg total) by mouth daily. 11/12/23   Barrett, Warren SAILOR, PA-C  hydroxypropyl methylcellulose / hypromellose (ISOPTO TEARS / GONIOVISC) 2.5 % ophthalmic solution Place 1 drop into the right eye as needed for dry eyes.    [provider]  ketoconazole  (NIZORAL ) 2 % shampoo Apply 1 Application topically See admin instructions. Shampoo the scalp 2 times a week    [provider]  lactulose  (CHRONULAC ) 10 GM/15ML solution Take 30 mLs (20 g total) by mouth daily as needed for mild constipation. 10/15/23 01/13/24  Kingsley, Victoria K, DO  melatonin 5 MG TABS Take 1 tablet (5 mg total) by mouth at bedtime. 09/06/23 12/05/23  [place], Eric J, DO  Menthol -Methyl Salicylate  (SALONPAS  PAIN RELIEF  PATCH) PTCH Apply 1 patch topically See admin instructions. Apply one patch to the mid-back & one to the lower back in the morning and remove approx 12 hours later    [provider]  metoprolol  succinate (TOPROL  XL) 25 MG 24 hr tablet Take 0.5 tablets (12.5 mg total) by mouth daily. 10/15/23 01/15/24  Kingsley, Victoria K, DO  omeprazole  (PRILOSEC) 40 MG capsule Take 1 capsule (40 mg total)  by mouth daily before breakfast. 10/15/23 01/15/24  Kingsley, Victoria K, DO  oxyCODONE  (OXY IR/ROXICODONE ) 5 MG immediate release tablet Take 1 tablet (5 mg total) by mouth every 8 (eight) hours as needed for severe pain (pain score 7-10). 09/06/23   [place], Eric J, DO  PROAIR  HFA 108 (90 Base) MCG/ACT inhaler Inhale 2 puffs into the lungs every 6 (six) hours as needed for wheezing or shortness of breath.    [provider]  spironolactone  (ALDACTONE ) 50 MG tablet Take 1 tablet (50 mg total) by mouth daily. 10/15/23 01/15/24  Kingsley, Victoria K, DO    Allergies: Penicillins    Review of Systems   Gastrointestinal:  Positive for abdominal pain.    Updated Vital Signs  Vitals:   12/02/23 1440 12/02/23 1615  BP: 119/84 (!) 119/91  Pulse: 99 73  Resp: 18 17  Temp: 97.7 F (36.5 C)   TempSrc: Oral   SpO2: 99% 100%     Physical Exam Vitals and nursing note reviewed.  HENT:     Head: Normocephalic.  Eyes:     Extraocular Movements: Extraocular movements intact.  Cardiovascular:     Rate and Rhythm: Normal rate.  Pulmonary:     Effort: Pulmonary effort is normal.     Breath sounds: Wheezing and rales present.  Abdominal:     General: There is distension.  Musculoskeletal:     Cervical back: Normal range of motion.     Comments: Moves all extremities spontaneously without difficulty  Skin:    General: Skin is warm and dry.  Neurological:     Mental Status: He is alert and oriented to person, place, and time.     (all labs ordered are listed, but only abnormal results are displayed) Labs Reviewed  CBC - Abnormal; Notable for the following components:      Result Value   RBC 4.03 (*)    Hemoglobin 9.8 (*)    HCT 32.7 (*)    MCH 24.3 (*)    RDW 17.5 (*)    Platelets 120 (*)    All other components within normal limits  COMPREHENSIVE METABOLIC PANEL WITH GFR - Abnormal; Notable for the following components:   Creatinine, Ser 1.45 (*)    Calcium  8.7 (*)    Albumin  3.0 (*)    GFR, Estimated 53 (*)    All other components within normal limits  PROTIME-INR - Abnormal; Notable for the following components:   Prothrombin Time 16.5 (*)    INR 1.3 (*)    All other components within normal limits  LIPASE, BLOOD  AMMONIA  URINALYSIS, ROUTINE W REFLEX MICROSCOPIC  BASIC METABOLIC PANEL WITH GFR  CBC    EKG: None  Radiology: No results found.   Procedures   Medications Ordered in the ED - No data to display                                  Medical Decision Making This patient presents to the ED for concern of ascites, this involves an extensive  number of treatment options, and is a complaint that carries with it a high risk of complications and morbidity.  The differential diagnosis includes ascites, pleural effusion, alcoholic cirrhosis, medication noncompliance   Co morbidities that complicate the patient evaluation  Alcoholic cirrhosis with ascites   Additional history obtained:  Additional history obtained from record review External records from outside source obtained and  reviewed including recent ED note   Lab Tests:  I Ordered, and personally interpreted labs.  The pertinent results include: CBC largely stable as compared to previous, hemoglobin of 9.8 is slightly reduced from most recent value of 10.3 two weeks ago, stable thrombocytopenia.  Prolonged PT/INR, this is largely stable as compared to previous.  CMP notable for creatinine of 1.45, this is largely stable as compared of his previous renal function; albumin  of 3.  Lipase within normal limits.  Ammonia within normal limits.   Imaging Studies ordered:  I ordered imaging studies including CXR  I independently visualized and interpreted imaging which showed 1. No acute findings. 2. Unchanged cardiomegaly  I agree with the radiologist interpretation   Cardiac Monitoring: / EKG:  The patient was maintained on a cardiac monitor.  I personally viewed and interpreted the cardiac monitored which showed an underlying rhythm of: NSR   Consultations Obtained:  I requested consultation with the hospitalist,  and discussed lab and imaging findings as well as pertinent plan - they recommend: Spoke with Dr. Roxane who agrees that this patient would benefit from admission for diuresis and likely paracentesis with IR if they are available tomorrow   Problem List / ED Course / Critical interventions / Medication management  I ordered medication including IV Lasix  for diuresis Reevaluation of the patient after these medicines showed that the patient improved I have  reviewed the patients home medicines and have made adjustments as needed   Social Determinants of Health:  Tobacco use   Test / Admission - Considered:  Physical exam notable as above.  Issue with medication noncompliance.  Unfortunately, IR is not available to perform any additional procedures today and will be unable to perform this patient's paracentesis today.  Given his worsening fluid overload, shortness of breath on exertion, and need for paracentesis, I do feel that hospitalization for IV diuresis and plan for paracentesis tomorrow with IR is reasonable.  I spoke with the hospitalist who is in agreement with this plan.  He is appropriate for admission at this time.    Amount and/or Complexity of Data Reviewed Labs: ordered. Radiology: ordered.  Risk Prescription drug management. Decision regarding hospitalization.        Final diagnoses:  Ascites due to alcoholic cirrhosis [facility])    ED Discharge Orders     None          Glendia Rocky SAILOR, [place] 12/02/23 1746    Emil Share, DO 12/02/23 2016

## 2023-12-03 ENCOUNTER — Other Ambulatory Visit (HOSPITAL_COMMUNITY): Payer: Self-pay

## 2023-12-03 ENCOUNTER — Encounter (HOSPITAL_COMMUNITY): Payer: Self-pay | Admitting: *Deleted

## 2023-12-03 ENCOUNTER — Observation Stay (HOSPITAL_COMMUNITY)

## 2023-12-03 DIAGNOSIS — K7031 Alcoholic cirrhosis of liver with ascites: Secondary | ICD-10-CM | POA: Diagnosis not present

## 2023-12-03 LAB — BASIC METABOLIC PANEL WITH GFR
Anion gap: 12 (ref 5–15)
BUN: 17 mg/dL (ref 8–23)
CO2: 24 mmol/L (ref 22–32)
Calcium: 8.8 mg/dL — ABNORMAL LOW (ref 8.9–10.3)
Chloride: 106 mmol/L (ref 98–111)
Creatinine, Ser: 1.58 mg/dL — ABNORMAL HIGH (ref 0.61–1.24)
GFR, Estimated: 48 mL/min — ABNORMAL LOW (ref >60)
Glucose, Bld: 89 mg/dL (ref 70–99)
Potassium: 4.6 mmol/L (ref 3.5–5.1)
Sodium: 142 mmol/L (ref 135–145)

## 2023-12-03 LAB — CBC
HCT: 31 % — ABNORMAL LOW (ref 39.0–52.0)
Hemoglobin: 9.4 g/dL — ABNORMAL LOW (ref 13.0–17.0)
MCH: 24.4 pg — ABNORMAL LOW (ref 26.0–34.0)
MCHC: 30.3 g/dL (ref 30.0–36.0)
MCV: 80.3 fL (ref 80.0–100.0)
Platelets: 118 K/uL — ABNORMAL LOW (ref 150–400)
RBC: 3.86 MIL/uL — ABNORMAL LOW (ref 4.22–5.81)
RDW: 17.5 % — ABNORMAL HIGH (ref 11.5–15.5)
WBC: 4.5 K/uL (ref 4.0–10.5)
nRBC: 0 % (ref 0.0–0.2)

## 2023-12-03 MED ORDER — LIDOCAINE-EPINEPHRINE (PF) 2 %-1:200000 IJ SOLN
INTRAMUSCULAR | Status: AC
  Filled 2023-12-03: qty 20

## 2023-12-03 MED ORDER — ALBUTEROL SULFATE HFA 108 (90 BASE) MCG/ACT IN AERS
2.0000 | INHALATION_SPRAY | Freq: Four times a day (QID) | RESPIRATORY_TRACT | 4 refills | Status: DC | PRN
  Filled 2023-12-03: qty 6.7, 17d supply, fill #0

## 2023-12-03 MED ORDER — FLOVENT HFA 220 MCG/ACT IN AERO
2.0000 | INHALATION_SPRAY | Freq: Two times a day (BID) | RESPIRATORY_TRACT | 12 refills | Status: DC
  Filled 2023-12-03: qty 12, 30d supply, fill #0

## 2023-12-03 MED ORDER — SPIRONOLACTONE 50 MG PO TABS
25.0000 mg | ORAL_TABLET | Freq: Every day | ORAL | 0 refills | Status: DC
  Filled 2023-12-03: qty 45, 90d supply, fill #0
  Filled 2023-12-03: qty 15, 30d supply, fill #0

## 2023-12-03 MED ORDER — METOPROLOL SUCCINATE ER 25 MG PO TB24
12.5000 mg | ORAL_TABLET | Freq: Every day | ORAL | 0 refills | Status: DC
  Filled 2023-12-03 (×2): qty 45, 90d supply, fill #0

## 2023-12-03 MED ORDER — FUROSEMIDE 20 MG PO TABS
40.0000 mg | ORAL_TABLET | Freq: Every day | ORAL | 2 refills | Status: DC
  Filled 2023-12-03: qty 30, 15d supply, fill #0

## 2023-12-03 MED ORDER — BUDESONIDE 0.5 MG/2ML IN SUSP: 0.5000 mg | Freq: Two times a day (BID) | RESPIRATORY_TRACT | Status: DC

## 2023-12-03 NOTE — Plan of Care (Signed)

## 2023-12-03 NOTE — Procedures (Signed)
 Ultrasound-guided therapeutic paracentesis performed yielding 6 liters of slightly hazy, yellow fluid. No immediate complications. EBL none.

## 2023-12-03 NOTE — Plan of Care (Signed)

## 2023-12-03 NOTE — Care Management Obs Status (Signed)
 MEDICARE OBSERVATION STATUS NOTIFICATION   Patient Details  Name: [place] SOUTER MRN: [id number] Date of Birth: [birth year]   Medicare Observation Status Notification Given:  Yes    Doneta Glenys DASEN, RN 12/03/2023, 2:51 PM

## 2023-12-03 NOTE — TOC Transition Note (Signed)
 Transition of Care [facility]) - Discharge Note   Patient Details  Name: John [name] MRN: [id number] Date of Birth: [birth year]  Transition of Care [facility]) CM/SW Contact:  Doneta Glenys DASEN, RN Phone Number: 12/03/2023, 3:09 PM   Clinical Narrative:    Per MD patient is medically ready for discharge. [facility] called for transport at discharge. CM called to schedule hospital follow up appointment. [facility] said they would call patient to schedule. CM informed [facility] that patient needs a home visit to assist patient with medication administration. CM signing off.    Final next level of care: Home/Self Care Barriers to Discharge: Barriers Resolved   Patient Goals and CMS Choice Patient states their goals for this hospitalization and ongoing recovery are:: home alone CMS Medicare.gov Compare Post Acute Care list provided to::  (NA) Choice offered to / list presented to : NA  ownership interest in [facility].provided to:: Parent NA    Discharge Placement                       Discharge Plan and Services Additional resources added to the After Visit Summary for       Post Acute Care Choice: NA          DME Arranged: N/A DME Agency: NA       HH Arranged: NA HH Agency: NA        Social Drivers of Health (SDOH) Interventions SDOH Screenings   Food Insecurity: No Food Insecurity (12/02/2023)  Housing: Low Risk  (12/02/2023)  Transportation Needs: No Transportation Needs (12/02/2023)  Utilities: Not At Risk (12/02/2023)  Social Connections: Moderately Integrated (12/02/2023)  Tobacco Use: Medium Risk (12/02/2023)     Readmission Risk Interventions    10/17/2023   12:18 PM 07/02/2023    2:22 PM  Readmission Risk Prevention Plan  Transportation Screening Complete Complete  Medication Review [employer]) Complete Complete  HRI or Home Care Consult Complete Complete  SW Recovery Care/Counseling Consult Complete Complete  Palliative Care Screening  Not Applicable Not Applicable  Skilled Nursing Facility Not Applicable Complete

## 2023-12-03 NOTE — Discharge Summary (Signed)
 Physician Discharge Summary  John [name] FMW:[id number] DOB: November 01, 1955 DOA: 12/02/2023  PCP: Cloria Annabella CROME, DO  Admit date: 12/02/2023 Discharge date: 12/03/2023  Admitted From: home Disposition:home Recommendations for Outpatient Follow-up:  Follow up with PCP in 1-2 weeks Please obtain BMP/CBC in one week  Home Health:no Equipment/Devices: None Discharge Condition: Stable CODE STATUS: Full code Diet recommendation: Cardiac  Brief/Interim Summary:  68 y.o. male with medical history significant for chronic diastolic congestive heart failure, CKD stage III, alcoholic cirrhosis requiring intermittent large-volume paracentesis being admitted to the hospital with recurrent paracentesis in the setting of running out of his medications about 1 week ago.  Patient denies abdominal pain, fevers, nausea, vomiting.  States that overall he is compliant with his medication but ran out about a week ago.  In that timeframe, he started to develop worsening abdominal discomfort and swelling.    Discharge Diagnoses:  Principal Problem:   Abdominal ascites  Recurrent abdominal ascites-he is noncompliant to medications as well as diet.  He had his last paracentesis on October 14, 2023 9 L.  He is admitted with recurrent ascites and is status post 6 L of paracentesis today 12/03/2023.  Continue Lasix  and Aldactone .   Chronic diastolic congestive heart failure-patient seems moderately volume overloaded on exam, but mainly has large volume ascites Continue Lasix  and Aldactone .   Atrial fibrillation/flutter-continue Toprol  for rate control.  Not on anticoagulation.     CKD stage III A-renal function appears to be at baseline   GERD-omeprazole    Iron deficiency anemia-hemoglobin stable   Hyperlipidemia-atorvastatin    Estimated body mass index is 31.95 kg/m as calculated from the following:   Height as of this encounter: 5' 8 (1.727 m).   Weight as of this encounter: 95.3 kg.  Discharge  Instructions  Discharge Instructions     Diet - low sodium heart healthy   Complete by: As directed    Increase activity slowly   Complete by: As directed    No wound care   Complete by: As directed       Allergies as of 12/03/2023       Reactions   Penicillins Other (See Comments)   Convulsions, Patient could not walk, childhood allergy         Medication List     STOP taking these medications    fluticasone  50 MCG/ACT nasal spray Commonly known as: FLONASE    oxyCODONE  5 MG immediate release tablet Commonly known as: Oxy IR/ROXICODONE        TAKE these medications    acetaminophen  325 MG tablet Commonly known as: TYLENOL  Take 650 mg by mouth every 8 (eight) hours as needed for mild pain (pain score 1-3) or moderate pain (pain score 4-6). Do not exceed 6 tabs per day.   allopurinol  100 MG tablet Commonly known as: ZYLOPRIM  Take 0.5 tablets (50 mg total) by mouth daily.   atorvastatin  40 MG tablet Commonly known as: LIPITOR Take 1 tablet (40 mg total) by mouth daily.   brimonidine  0.15 % ophthalmic solution Commonly known as: ALPHAGAN  Place 1 drop into both eyes 2 (two) times daily.   FeroSul 325 (65 Fe) MG tablet Generic drug: ferrous sulfate  Take 1 tablet (325 mg total) by mouth every Monday, Wednesday, and Friday.   Flovent  HFA 220 MCG/ACT inhaler Generic drug: fluticasone  Inhale 2 puffs into the lungs in the morning and at bedtime.   folic acid  1 MG tablet Commonly known as: FOLVITE  Take 1 tablet (1 mg total) by mouth daily.  furosemide  20 MG tablet Commonly known as: LASIX  Take 2 tablets (40 mg total) by mouth daily.   hydroxypropyl methylcellulose / hypromellose 2.5 % ophthalmic solution Commonly known as: ISOPTO TEARS / GONIOVISC Place 1 drop into the right eye as needed for dry eyes.   ketoconazole  2 % shampoo Commonly known as: NIZORAL  Apply 1 Application topically See admin instructions. Shampoo the scalp 2 times a week   melatonin  5 MG Tabs Take 1 tablet (5 mg total) by mouth at bedtime.   metoprolol  succinate 25 MG 24 hr tablet Commonly known as: Toprol  XL Take 0.5 tablets (12.5 mg total) by mouth daily.   omeprazole  40 MG capsule Commonly known as: PRILOSEC Take 1 capsule (40 mg total) by mouth daily before breakfast.   ProAir  HFA 108 (90 Base) MCG/ACT inhaler Generic drug: albuterol  Inhale 2 puffs into the lungs every 6 (six) hours as needed for wheezing or shortness of breath.   Salonpas  Pain Relief  Patch Ptch Apply 1 patch topically daily as needed (for mid-back pain and lower back pain).   spironolactone  50 MG tablet Commonly known as: ALDACTONE  Take 0.5 tablets (25 mg total) by mouth daily. What changed: how much to take        Follow-up Information     Reed, Tiffany L, DO Follow up.   Specialty: Geriatric Medicine Contact information: [address] [place] [address] [phone]                Allergies  Allergen Reactions   Penicillins Other (See Comments)    Convulsions, Patient could not walk, childhood allergy     Consultations: IR   Procedures/Studies: DG Chest Portable 1 View Result Date: 12/02/2023 EXAM: 1 VIEW XRAY OF THE CHEST 12/02/2023 03:46:00 PM COMPARISON: Chest radiograph dated 11/12/2023 end of report cervical . CLINICAL HISTORY: Worsening ascites, shortness of breath. FINDINGS: LUNGS AND PLEURA: No focal pulmonary opacity. Chronic-appearing pulmonary vascular congestion. No pulmonary edema. No pleural effusion. No pneumothorax. HEART AND MEDIASTINUM: Similarly enlarged cardiomediastinal silhouette compared to prior study. BONES AND SOFT TISSUES: No acute osseous abnormality. IMPRESSION: 1. No acute findings. 2. Unchanged cardiomegaly. Electronically signed by: Manford Cummins MD 12/02/2023 04:01 PM EDT RP Workstation: [id number]   US  Paracentesis Result Date: 11/13/2023 INDICATION: Patient with history of alcoholic cirrhosis, heart failure, chronic kidney  disease, hepatitis-C, renal cell carcinoma with prior cryoablation, recurrent ascites. Request received for therapeutic paracentesis. EXAM: ULTRASOUND GUIDED THERAPEUTIC PARACENTESIS MEDICATIONS: 8 mL 1% lidocaine  COMPLICATIONS: None immediate. PROCEDURE: Informed written consent was obtained from the patient after a discussion of the risks, benefits and alternatives to treatment. A timeout was performed prior to the initiation of the procedure. Initial ultrasound scanning demonstrates a large amount of ascites within the right upper to mid abdominal quadrant. The right upper to mid abdomen was prepped and draped in the usual sterile fashion. 1% lidocaine  was used for local anesthesia. Following this, a 19 gauge, 10-cm, Yueh catheter was introduced. An ultrasound image was saved for documentation purposes. The paracentesis was performed. The catheter was removed and a dressing was applied. The patient tolerated the procedure well without immediate post procedural complication. FINDINGS: A total of approximately 9 liters of slightly hazy, yellow fluid was removed. IMPRESSION: Successful ultrasound-guided therapeutic paracentesis yielding 9 liters of peritoneal fluid. PLAN: The patient has previously been formally evaluated by the [facility] and is being actively followed for potential future intervention. Performed by: Franky Rakers, PA-C Electronically Signed   By: JONETTA  Johann M.D.   On: 11/13/2023 09:18   DG Chest Port 1 View Result Date: 11/12/2023 CLINICAL DATA:  Shortness of breath EXAM: PORTABLE CHEST 1 VIEW COMPARISON:  None Available. FINDINGS: Heart is enlarged. Low lung volumes. No pleural effusion, focal consolidation, or pneumothorax. Limited visualization of the retrocardiac region. IMPRESSION: 1. Enlarged heart with low lung volumes. Electronically Signed   By: Maude Naegeli M.D.   On: 11/12/2023 13:07   (Echo, Carotid, EGD, Colonoscopy, ERCP)     Subjective:  ANXIOUS to go home S/p paracentesis  Discharge Exam: Vitals:   12/03/23 1358 12/03/23 1417  BP: 103/79 98/76  Pulse:  82  Resp:    Temp:    SpO2:  92%   Vitals:   12/03/23 1346 12/03/23 1358 12/03/23 1417 12/03/23 1423  BP: 96/68 103/79 98/76   Pulse:   82   Resp:      Temp:      TempSrc:      SpO2:   92%   Weight:    95.3 kg  Height:    5' 8 (1.727 m)    General: Pt is alert, awake, not in acute distress Cardiovascular: RRR, S1/S2 +, no rubs, no gallops Respiratory: CTA bilaterally, no wheezing, no rhonchi Abdominal: Soft, NT, ND, bowel sounds + Extremities: edema   The results of significant diagnostics from this hospitalization (including imaging, microbiology, ancillary and laboratory) are listed below for reference.     Microbiology: No results found for this or any previous visit (from the past 240 hours).   Labs: BNP (last 3 results) Recent Labs    10/15/23 1330 10/20/23 1605 11/12/23 1304  BNP 273.0* 320.2* 256.6*   Basic Metabolic Panel: Recent Labs  Lab 12/02/23 1527 12/03/23 0441  NA 137 142  K 4.3 4.6  CL 104 106  CO2 26 24  GLUCOSE 82 89  BUN 15 17  CREATININE 1.45* 1.58*  CALCIUM  8.7* 8.8*   Liver Function Tests: Recent Labs  Lab 12/02/23 1527  AST 21  ALT 10  ALKPHOS 72  BILITOT 1.1  PROT 7.4  ALBUMIN  3.0*   Recent Labs  Lab 12/02/23 1527  LIPASE 35   Recent Labs  Lab 12/02/23 1611  AMMONIA 20   CBC: Recent Labs  Lab 12/02/23 1527 12/03/23 0441  WBC 4.6 4.5  HGB 9.8* 9.4*  HCT 32.7* 31.0*  MCV 81.1 80.3  PLT 120* 118*   Cardiac Enzymes: No results for input(s): CKTOTAL, CKMB, CKMBINDEX, TROPONINI in the last 168 hours. BNP: Invalid input(s): POCBNP CBG: No results for input(s): GLUCAP in the last 168 hours. D-Dimer No results for input(s): DDIMER in the last 72 hours. Hgb A1c No results for input(s): HGBA1C in the last 72 hours. Lipid Profile No results for  input(s): CHOL, HDL, LDLCALC, TRIG, CHOLHDL, LDLDIRECT in the last 72 hours. Thyroid function studies No results for input(s): TSH, T4TOTAL, T3FREE, THYROIDAB in the last 72 hours.  Invalid input(s): FREET3 Anemia work up No results for input(s): VITAMINB12, FOLATE, FERRITIN, TIBC, IRON, RETICCTPCT in the last 72 hours. Urinalysis    Component Value Date/Time   COLORURINE STRAW (A) 12/02/2023 1509   APPEARANCEUR CLEAR 12/02/2023 1509   LABSPEC 1.006 12/02/2023 1509   PHURINE 5.0 12/02/2023 1509   GLUCOSEU NEGATIVE 12/02/2023 1509   HGBUR NEGATIVE 12/02/2023 1509   BILIRUBINUR NEGATIVE 12/02/2023 1509   KETONESUR NEGATIVE 12/02/2023 1509   PROTEINUR NEGATIVE 12/02/2023 1509   UROBILINOGEN 1.0 10/31/2014 2127   NITRITE NEGATIVE 12/02/2023 1509  LEUKOCYTESUR NEGATIVE 12/02/2023 1509   Sepsis Labs Recent Labs  Lab 12/02/23 1527 12/03/23 0441  WBC 4.6 4.5   Microbiology No results found for this or any previous visit (from the past 240 hours).   Time coordinating discharge: 39 min SIGNED:  [name], MD  [facility] 12/03/2023, 2:33 PM

## 2023-12-03 NOTE — Progress Notes (Signed)
 RN reviewed and read discharge instructions. Pt verbalized understanding. Pt escorted via wheelchair to main entrance to [facility] transportation service.

## 2023-12-03 NOTE — Progress Notes (Signed)
 Discharge medications delivered to patient at bedside [facility]

## 2023-12-03 NOTE — Evaluation (Signed)
 Physical Therapy Evaluation Patient Details Name: JORJE VANATTA MRN: [id number] DOB: March 17, 1956 Today's Date: 12/03/2023  History of Present Illness  68 y.o. male being admitted to the hospital with recurrent paracentesis in the setting of running out of his medications. States that overall he is compliant with his medication but ran out about a week ago.  status post 6 L of paracentesis today 12/03/2023.   medical history significant for ETOH, HTN, COPD,polysubstance abuse,obesity, gout, anemia, chronic diastolic congestive heart failure, CKD stage III, alcoholic cirrhosis requiring intermittent large-volume paracentesis  Clinical Impression  Patient evaluated by Physical Therapy with no further acute PT needs identified. All education has been completed and the patient has no further questions.  Pt agreeable and motivated to mobilize, amb ~ 83' with RW, CGA to supervision. Denies DOE, SPO2=99% on RW, HR 85. Pt reported feeling well after amb  See below for any follow-up Physical Therapy or equipment needs. PT is signing off. Thank you for this referral.         If plan is discharge home, recommend the following: Assistance with cooking/housework;Help with stairs or ramp for entrance   Can travel by private vehicle        Equipment Recommendations None recommended by PT  Recommendations for Other Services       Functional Status Assessment Patient has not had a recent decline in their functional status     Precautions / Restrictions Precautions Precautions: Fall Restrictions Weight Bearing Restrictions Per Provider Order: No      Mobility  Bed Mobility               General bed mobility comments: pt in recliner and returned to same    Transfers Overall transfer level: Needs assistance Equipment used: Rolling walker (2 wheels) Transfers: Sit to/from Stand Sit to Stand: Contact guard assist, Supervision           General transfer comment: for safety     Ambulation/Gait Ambulation/Gait assistance: Supervision, Contact guard assist Gait Distance (Feet): 60 Feet Assistive device: Rolling walker (2 wheels) Gait Pattern/deviations: Step-through pattern, Wide base of support Gait velocity: decr     General Gait Details: knees flexed throughout gait cycle. CGA with fade to supervision for safety. slow but steady gait, no LOB  Stairs            Wheelchair Mobility     Tilt Bed    Modified Rankin (Stroke Patients Only)       Balance Overall balance assessment: Needs assistance Sitting-balance support: No upper extremity supported, Feet supported Sitting balance-Leahy Scale: Good     Standing balance support: Reliant on assistive device for balance, During functional activity Standing balance-Leahy Scale: Fair                               Pertinent Vitals/Pain Pain Assessment Pain Assessment: No/denies pain    Home Living Family/patient expects to be discharged to:: Private residence Living Arrangements: Alone Available Help at Discharge: Neighbor;Available PRN/intermittently Type of Home: Apartment Home Access: Elevator;Stairs to enter   Entrance Stairs-Number of Steps: uses elevator to 5th floor--reports he used to run up the stairs   Home Layout: One level Home Equipment: Rollator (4 wheels);Rolling Walker (2 wheels);Cane - single point;Grab bars - tub/shower;Grab bars - toilet;BSC/3in1;Shower seat Additional Comments: Aide Romero comes in 5 days a week for about 2 hours that assists with housework    Prior Function Prior Level of  Function : Independent/Modified Independent             Mobility Comments: pt reports ind with amb in the home furniture walking, uses  rollator consistently  in community. leaves apartment 1-2x/day when feeling well ADLs Comments: pt reports ind     Extremity/Trunk Assessment   Upper Extremity Assessment Upper Extremity Assessment: Overall WFL for tasks  assessed    Lower Extremity Assessment Lower Extremity Assessment:  (grossly 3+/5 bil LEs)       Communication   Communication Communication: No apparent difficulties    Cognition Arousal: Alert Behavior During Therapy: WFL for tasks assessed/performed   PT - Cognitive impairments: No apparent impairments                         Following commands: Intact       Cueing Cueing Techniques: Verbal cues     General Comments      Exercises     Assessment/Plan    PT Assessment Patient does not need any further PT services  PT Problem List         PT Treatment Interventions      PT Goals (Current goals can be found in the Care Plan section)  Acute Rehab PT Goals PT Goal Formulation: All assessment and education complete, DC therapy    Frequency       Co-evaluation               AM-PAC PT 6 Clicks Mobility  Outcome Measure Help needed turning from your back to your side while in a flat bed without using bedrails?: A Little Help needed moving from lying on your back to sitting on the side of a flat bed without using bedrails?: A Little Help needed moving to and from a bed to a chair (including a wheelchair)?: A Little Help needed standing up from a chair using your arms (e.g., wheelchair or bedside chair)?: A Little Help needed to walk in hospital room?: A Little Help needed climbing 3-5 steps with a railing? : A Little 6 Click Score: 18    End of Session Equipment Utilized During Treatment: Gait belt Activity Tolerance: Patient tolerated treatment well Patient left: with call bell/phone within reach;in chair   PT Visit Diagnosis: Other abnormalities of gait and mobility (R26.89)    Time: 8350-8341 PT Time Calculation (min) (ACUTE ONLY): 9 min   Charges:   PT Evaluation $PT Eval Low Complexity: 1 Low   PT General Charges $$ ACUTE PT VISIT: 1 Visit         Emmerie Battaglia, PT  Acute Rehab Dept ([facility])  [phone]  12/03/2023   [facility] 12/03/2023, 5:07 PM

## 2023-12-03 NOTE — TOC Initial Note (Signed)
 Transition of Care [facility]) - Initial/Assessment Note    Patient Details  Name: John [name] MRN: [id number] Date of Birth: 1955-06-05  [facility] CM/SW Contact:    Doneta Glenys DASEN, RN Phone Number: 12/03/2023, 2:59 PM  Clinical Narrative:                 MOON completed. Presented for abdominal ascites. CM met with patient in the room. Patient states PTA lives in an apartment alone. PCP/insurance verified. Patient is active with [facility]. CM will call to [facility] to arrange for transportation at discharge.  Expected Discharge Plan: Home/Self Care Barriers to Discharge: Barriers Resolved   Patient Goals and CMS Choice Patient states their goals for this hospitalization and ongoing recovery are:: home alone CMS Medicare.gov Compare Post Acute Care list provided to::  (NA) Choice offered to / list presented to : NA  ownership interest in [facility].provided to:: Parent NA    Expected Discharge Plan and Services     Post Acute Care Choice: NA Living arrangements for the past 2 months: Apartment Expected Discharge Date: 12/03/23               DME Arranged: N/A DME Agency: NA       HH Arranged: NA HH Agency: NA        Prior Living Arrangements/Services Living arrangements for the past 2 months: Apartment Lives with:: Self Patient language and need for interpreter reviewed:: Yes Do you feel safe going back to the place where you live?: Yes      Need for Family Participation in Patient Care: No (Comment) Care giver support system in place?: Yes (comment)   Criminal Activity/Legal Involvement Pertinent to Current Situation/Hospitalization: No - Comment as needed  Activities of Daily Living   ADL Screening (condition at time of admission) Independently performs ADLs?: No Does the patient have a NEW difficulty with bathing/dressing/toileting/self-feeding that is expected to last >3 days?: No Does the patient have a NEW difficulty  with getting in/out of bed, walking, or climbing stairs that is expected to last >3 days?: No Does the patient have a NEW difficulty with communication that is expected to last >3 days?: No Is the patient deaf or have difficulty hearing?: No Does the patient have difficulty seeing, even when wearing glasses/contacts?: No Does the patient have difficulty concentrating, remembering, or making decisions?: No  Permission Sought/Granted Permission sought to share information with : Case Manager Permission granted to share information with : Yes, Verbal Permission Granted  Share Information with NAME: Michaell, Grider (Niece)  [phone]           Emotional Assessment Appearance:: Appears stated age Attitude/Demeanor/Rapport: Engaged Affect (typically observed): Appropriate Orientation: : Oriented to Self, Oriented to Place, Oriented to  Time, Oriented to Situation Alcohol / Substance Use: Not Applicable Psych Involvement: No (comment)  Admission diagnosis:  Ascites due to alcoholic cirrhosis (HCC) [K70.31] Abdominal ascites [R18.8] Patient Active Problem List   Diagnosis Date Noted   Abdominal ascites 12/02/2023   Bilateral lower extremity edema 10/16/2023   Acute on chronic diastolic (congestive) heart failure (HCC) 10/15/2023   Chronic anemia 07/01/2023   Acute exacerbation of CHF (congestive heart failure) (HCC) 06/30/2023   History of hepatitis C 05/14/2023   Portal hypertension (HCC) 05/14/2023   Decompensated cirrhosis (HCC) 05/14/2023   Atrial flutter (HCC) 05/12/2023   Hypomagnesemia 05/08/2023   New onset atrial fibrillation (HCC) 05/07/2023   Stasis dermatitis of both legs 08/05/2022  Glaucoma (increased eye pressure) 08/05/2022   COPD with acute exacerbation (HCC) 08/05/2022   Acute exacerbation of congestive heart failure (HCC) 07/31/2022   Renal mass, left 07/05/2020   Umbilical hernia without obstruction and without gangrene 04/27/2019   Severe obesity (BMI  35.0-39.9) with comorbidity (HCC) 06/25/2018   Acute gouty arthritis 05/06/2018   Polyarthralgia 05/03/2018   History of renal cell carcinoma 01/12/2018   Esophageal varices without bleeding (HCC)    Renal cell carcinoma of left kidney (HCC) 11/08/2016   Malnutrition of moderate degree 07/20/2015   (HFpEF) heart failure with preserved ejection fraction (HCC) 07/19/2015   Abdominal pain 07/19/2015   Renal failure (ARF), acute on chronic (HCC) 07/19/2015   Acute-on-chronic kidney injury (HCC)    Hyponatremia 07/07/2015   Dehydration 07/07/2015   Acute respiratory failure (HCC) 07/07/2015   Acute respiratory failure with hypoxia (HCC) 05/23/2015   Fluid overload 10/31/2014   Hepatitis C 10/31/2014   Hepatitis, viral    Edema of abdominal wall    Pedal edema    Alcoholism (HCC)    Abdominal edema on examination 09/01/2014   Chronic liver failure (HCC)    Alcoholic cirrhosis of liver with ascites (HCC)    Chronic hepatitis C with cirrhosis (HCC)    Cardiac hypertrophy    Fever of unknown origin    Diastolic dysfunction    Altered mental status    Metabolic encephalopathy    Pulmonary edema    Acute encephalopathy    Ascites due to alcoholic cirrhosis (HCC)    Hypernatremia    Other specified fever    Varices, gastric    Acute on chronic renal failure (HCC)    Elevated transaminase level    Primary hypertension    Gastric varices    Upper GI bleed 08/14/2014   Acute blood loss anemia 08/14/2014   Alcohol abuse 08/14/2014   Coagulopathy (HCC) 08/14/2014   AKI (acute kidney injury) (HCC) 08/14/2014   GI bleed 08/14/2014   GIB (gastrointestinal bleeding) 08/14/2014   Bleeding gastric varices 08/14/2014   Acute upper GI bleed    Cirrhosis (HCC) 07/27/2014   Abdominal pain, generalized 07/27/2014   Ascites 07/26/2014   Optic neuritis 07/11/2014   Abnormal LFTs    Vision loss of left eye 06/24/2014   Accelerated hypertension 06/24/2014   Elevated LFTs 06/24/2014    Polysubstance abuse (HCC) 06/24/2014   Stage 3b chronic kidney disease (HCC) 06/24/2014   SOB (shortness of breath) 06/24/2014   Vision loss, left eye 06/24/2014   Thrombocytopenia (HCC) 06/24/2014   Hypokalemia 06/24/2014   Elevated troponin 06/24/2014   Neck pain 12/29/2012   Low back pain 12/29/2012   PCP:  Cloria Annabella CROME, DO Pharmacy:   [facility] [address] [place] [address] Phone: [phone] Fax: [phone]  [facility] [address] [place] [address] Phone: [phone] Fax: [phone]  [name] [address] [place] [address] Phone: [phone] Fax: [phone]     Social Drivers of Health (SDOH) Social History: SDOH Screenings   Food Insecurity: No Food Insecurity (12/02/2023)  Housing: Low Risk  (12/02/2023)  Transportation Needs: No Transportation Needs (12/02/2023)  Utilities: Not At Risk (12/02/2023)  Social Connections: Moderately Integrated (12/02/2023)  Tobacco Use: Medium Risk (12/02/2023)   SDOH Interventions:     Readmission Risk Interventions    10/17/2023   12:18 PM 07/02/2023    2:22 PM  Readmission Risk  Prevention Plan  Transportation Screening Complete Complete  Medication Review Oceanographer) Complete Complete  HRI or Home Care Consult Complete Complete  SW Recovery Care/Counseling Consult Complete Complete  Palliative Care Screening Not Applicable Not Applicable  Skilled Nursing Facility Not Applicable Complete

## 2023-12-03 NOTE — Progress Notes (Incomplete)
 PROGRESS NOTE    John [name]  FMW:[id number] DOB: 04/17/1956 DOA: 12/02/2023 PCP: Cloria Annabella CROME, DO    Brief Narrative: 68 y.o. male with medical history significant for chronic diastolic congestive heart failure, CKD stage III, alcoholic cirrhosis requiring intermittent large-volume paracentesis being admitted to the hospital with recurrent paracentesis in the setting of running out of his medications about 1 week ago.  Patient denies abdominal pain, fevers, nausea, vomiting.  States that overall he is compliant with his medication but ran out about a week ago.  In that timeframe, he started to develop worsening abdominal discomfort and swelling.   Assessment & Plan:   Principal Problem:   Abdominal ascites  Recurrent abdominal ascites-in the setting of known history of alcoholic liver cirrhosis, and medication noncompliance.  He last had 9 L paracentesis with IR on 7/17 -Observation admission -Patient was given a dose of IV Lasix  in the emergency department -Continue home Aldactone , and Lasix  in the morning   Chronic diastolic congestive heart failure-patient seems moderately volume overloaded on exam, but mainly has large volume ascites -Given a dose of IV Lasix  in the emergency department -Continue Toprol -XL -Continue daily Lasix    Atrial fibrillation/flutter-rates well-controlled, not a candidate for anticoagulation -Continue Toprol -XL   CKD stage III-renal function appears to be at baseline   GERD-omeprazole    Iron deficiency anemia-hemoglobin stable   Hyperlipidemia-atorvastatin                       Estimated body mass index is 34.97 kg/m as calculated from the following:   Height as of 11/12/23: 5' 8 (1.727 m).   Weight as of 11/12/23: 104.3 kg.  DVT prophylaxis: (Lovenox /Heparin /SCD's/anticoagulated/None (if comfort care) Code Status: (Full/Partial - specify details) Family Communication: (Specify name, relationship & date discussed. NO  discussed with patient) Disposition Plan:  Status is: Observation {Observation:23811}   Consultants:  ***  Procedures: (Don't include imaging studies which can be auto populated. Include things that cannot be auto populated i.e. Echo, Carotid and venous dopplers, Foley, Bipap, HD, tubes/drains, wound vac, central lines etc) ***  Antimicrobials: (specify start and planned stop date. Auto populated tables are space occupying and do not give end dates) ***    Subjective: ***  Objective: Vitals:   12/03/23 0500 12/03/23 0753 12/03/23 0820 12/03/23 0825  BP: 100/74 107/79    Pulse: 98 (!) 116 (!) 102   Resp: 18 16    Temp: 98.1 F (36.7 C) 98 F (36.7 C)    TempSrc: Oral Oral    SpO2: 98% 97%  93%    Intake/Output Summary (Last 24 hours) at 12/03/2023 1254 Last data filed at 12/03/2023 0845 Gross per 24 hour  Intake 240 ml  Output 550 ml  Net -310 ml   There were no vitals filed for this visit.  Examination:  General exam: Appears calm and comfortable  Respiratory system: Clear to auscultation. Respiratory effort normal. Cardiovascular system: S1 & S2 heard, RRR. No JVD, murmurs, rubs, gallops or clicks. No pedal edema. Gastrointestinal system: Abdomen is nondistended, soft and nontender. No organomegaly or masses felt. Normal bowel sounds heard. Central nervous system: Alert and oriented. No focal neurological deficits. Extremities: Symmetric 5 x 5 power. Skin: No rashes, lesions or ulcers Psychiatry: Judgement and insight appear normal. Mood & affect appropriate.     Data Reviewed: I have personally reviewed following labs and imaging studies  CBC: Recent Labs  Lab 12/02/23 1527 12/03/23 0441  WBC 4.6 4.5  HGB  9.8* 9.4*  HCT 32.7* 31.0*  MCV 81.1 80.3  PLT 120* 118*   Basic Metabolic Panel: Recent Labs  Lab 12/02/23 1527 12/03/23 0441  NA 137 142  K 4.3 4.6  CL 104 106  CO2 26 24  GLUCOSE 82 89  BUN 15 17  CREATININE 1.45* 1.58*  CALCIUM  8.7*  8.8*   GFR: CrCl cannot be calculated (Unknown ideal weight.). Liver Function Tests: Recent Labs  Lab 12/02/23 1527  AST 21  ALT 10  ALKPHOS 72  BILITOT 1.1  PROT 7.4  ALBUMIN  3.0*   Recent Labs  Lab 12/02/23 1527  LIPASE 35   Recent Labs  Lab 12/02/23 1611  AMMONIA 20   Coagulation Profile: Recent Labs  Lab 12/02/23 1527  INR 1.3*   Cardiac Enzymes: No results for input(s): CKTOTAL, CKMB, CKMBINDEX, TROPONINI in the last 168 hours. BNP (last 3 results) Recent Labs    09/04/23 1048  PROBNP 2,814.0*   HbA1C: No results for input(s): HGBA1C in the last 72 hours. CBG: No results for input(s): GLUCAP in the last 168 hours. Lipid Profile: No results for input(s): CHOL, HDL, LDLCALC, TRIG, CHOLHDL, LDLDIRECT in the last 72 hours. Thyroid Function Tests: No results for input(s): TSH, T4TOTAL, FREET4, T3FREE, THYROIDAB in the last 72 hours. Anemia Panel: No results for input(s): VITAMINB12, FOLATE, FERRITIN, TIBC, IRON, RETICCTPCT in the last 72 hours. Sepsis Labs: No results for input(s): PROCALCITON, LATICACIDVEN in the last 168 hours.  No results found for this or any previous visit (from the past 240 hours).    Radiology Studies: DG Chest Portable 1 View Result Date: 12/02/2023 EXAM: 1 VIEW XRAY OF THE CHEST 12/02/2023 03:46:00 PM COMPARISON: Chest radiograph dated 11/12/2023 end of report cervical . CLINICAL HISTORY: Worsening ascites, shortness of breath. FINDINGS: LUNGS AND PLEURA: No focal pulmonary opacity. Chronic-appearing pulmonary vascular congestion. No pulmonary edema. No pleural effusion. No pneumothorax. HEART AND MEDIASTINUM: Similarly enlarged cardiomediastinal silhouette compared to prior study. BONES AND SOFT TISSUES: No acute osseous abnormality. IMPRESSION: 1. No acute findings. 2. Unchanged cardiomegaly. Electronically signed by: Limin Xu MD 12/02/2023 04:01 PM EDT RP Workstation: [id number]     Scheduled Meds:  atorvastatin   40 mg Oral Daily   budesonide  (PULMICORT ) nebulizer solution  0.5 mg Nebulization BID   feeding supplement  237 mL Oral BID BM   furosemide   40 mg Oral Daily   metoprolol  succinate  12.5 mg Oral Daily   pantoprazole   40 mg Oral Daily   spironolactone   50 mg Oral Daily   Continuous Infusions:   LOS: 0 days    Time spent:   [name], MD  12/03/2023, 12:54 PM

## 2023-12-03 NOTE — Progress Notes (Signed)
 PROGRESS NOTE    John [name]  FMW:[id number] DOB: 02-06-1956 DOA: 12/02/2023 PCP: Cloria Annabella CROME, DO    Brief Narrative: 68 y.o. male with medical history significant for chronic diastolic congestive heart failure, CKD stage III, alcoholic cirrhosis requiring intermittent large-volume paracentesis being admitted to the hospital with recurrent paracentesis in the setting of running out of his medications about 1 week ago.  Patient denies abdominal pain, fevers, nausea, vomiting.  States that overall he is compliant with his medication but ran out about a week ago.  In that timeframe, he started to develop worsening abdominal discomfort and swelling.   Assessment & Plan:   Principal Problem:   Abdominal ascites  Recurrent abdominal ascites-he is noncompliant to medications as well as diet.  He had his last paracentesis on October 14, 2023 9 L.  He is admitted with recurrent ascites and is status post 6 L of paracentesis today 12/03/2023.  Continue Lasix  and Aldactone .  Chronic diastolic congestive heart failure-patient seems moderately volume overloaded on exam, but mainly has large volume ascites Continue Lasix  and Aldactone .   Atrial fibrillation/flutter-continue Toprol  for rate control.  Not on anticoagulation.    CKD stage III A-renal function appears to be at baseline   GERD-omeprazole    Iron deficiency anemia-hemoglobin stable   Hyperlipidemia-atorvastatin    Estimated body mass index is 34.97 kg/m as calculated from the following:   Height as of 11/12/23: 5' 8 (1.727 m).   Weight as of 11/12/23: 104.3 kg.  DVT prophylaxis: scd code Status: Full code Family Communication: None  disposition Plan:  Status is: Observation Consulted physical therapy Hopefully DC home tomorrow   Consultants: IR  Procedures: Paracentesis 12/03/2023 Antimicrobials: None  Subjective: Resting in bed I saw him prior to paracentesis abdomen was taut tender Complains of dizziness with  standing  Objective: Vitals:   12/03/23 1341 12/03/23 1342 12/03/23 1346 12/03/23 1358  BP: 93/76 90/77 96/68  103/79  Pulse:      Resp:      Temp:      TempSrc:      SpO2:        Intake/Output Summary (Last 24 hours) at 12/03/2023 1415 Last data filed at 12/03/2023 0845 Gross per 24 hour  Intake 240 ml  Output 550 ml  Net -310 ml   There were no vitals filed for this visit.  Examination:  General exam: Appears in mild distress tachycardic intermittently Respiratory system: Diminished breath sounds Cardiovascular system: Rate regular tachycardic gastrointestinal system: Abdomen is distended, soft and non tender Central nervous system: Alert and oriented. No focal neurological deficits. Extremities: 1+ edema bilaterally  Data Reviewed: I have personally reviewed following labs and imaging studies  CBC: Recent Labs  Lab 12/02/23 1527 12/03/23 0441  WBC 4.6 4.5  HGB 9.8* 9.4*  HCT 32.7* 31.0*  MCV 81.1 80.3  PLT 120* 118*   Basic Metabolic Panel: Recent Labs  Lab 12/02/23 1527 12/03/23 0441  NA 137 142  K 4.3 4.6  CL 104 106  CO2 26 24  GLUCOSE 82 89  BUN 15 17  CREATININE 1.45* 1.58*  CALCIUM  8.7* 8.8*   GFR: CrCl cannot be calculated (Unknown ideal weight.). Liver Function Tests: Recent Labs  Lab 12/02/23 1527  AST 21  ALT 10  ALKPHOS 72  BILITOT 1.1  PROT 7.4  ALBUMIN  3.0*   Recent Labs  Lab 12/02/23 1527  LIPASE 35   Recent Labs  Lab 12/02/23 1611  AMMONIA 20   Coagulation Profile: Recent Labs  Lab 12/02/23 1527  INR 1.3*   Cardiac Enzymes: No results for input(s): CKTOTAL, CKMB, CKMBINDEX, TROPONINI in the last 168 hours. BNP (last 3 results) Recent Labs    09/04/23 1048  PROBNP 2,814.0*   HbA1C: No results for input(s): HGBA1C in the last 72 hours. CBG: No results for input(s): GLUCAP in the last 168 hours. Lipid Profile: No results for input(s): CHOL, HDL, LDLCALC, TRIG, CHOLHDL, LDLDIRECT in  the last 72 hours. Thyroid Function Tests: No results for input(s): TSH, T4TOTAL, FREET4, T3FREE, THYROIDAB in the last 72 hours. Anemia Panel: No results for input(s): VITAMINB12, FOLATE, FERRITIN, TIBC, IRON, RETICCTPCT in the last 72 hours. Sepsis Labs: No results for input(s): PROCALCITON, LATICACIDVEN in the last 168 hours.  No results found for this or any previous visit (from the past 240 hours).    Radiology Studies: DG Chest Portable 1 View Result Date: 12/02/2023 EXAM: 1 VIEW XRAY OF THE CHEST 12/02/2023 03:46:00 PM COMPARISON: Chest radiograph dated 11/12/2023 end of report cervical . CLINICAL HISTORY: Worsening ascites, shortness of breath. FINDINGS: LUNGS AND PLEURA: No focal pulmonary opacity. Chronic-appearing pulmonary vascular congestion. No pulmonary edema. No pleural effusion. No pneumothorax. HEART AND MEDIASTINUM: Similarly enlarged cardiomediastinal silhouette compared to prior study. BONES AND SOFT TISSUES: No acute osseous abnormality. IMPRESSION: 1. No acute findings. 2. Unchanged cardiomegaly. Electronically signed by: Limin Xu MD 12/02/2023 04:01 PM EDT RP Workstation: [id number]    Scheduled Meds:  atorvastatin   40 mg Oral Daily   budesonide  (PULMICORT ) nebulizer solution  0.5 mg Nebulization BID   feeding supplement  237 mL Oral BID BM   furosemide   40 mg Oral Daily   metoprolol  succinate  12.5 mg Oral Daily   pantoprazole   40 mg Oral Daily   spironolactone   50 mg Oral Daily   Continuous Infusions:   LOS: 0 days    John KANDICE Hoots, MD  12/03/2023, 2:15 PM

## 2023-12-04 ENCOUNTER — Other Ambulatory Visit: Payer: Self-pay

## 2023-12-04 ENCOUNTER — Observation Stay (HOSPITAL_COMMUNITY)
Admission: EM | Admit: 2023-12-04 | Discharge: 2023-12-05 | Disposition: A | Discharge status: Home / Self Care | Attending: Internal Medicine | Admitting: Internal Medicine

## 2023-12-04 ENCOUNTER — Encounter (HOSPITAL_COMMUNITY): Payer: Self-pay | Admitting: *Deleted

## 2023-12-04 ENCOUNTER — Emergency Department (HOSPITAL_COMMUNITY)

## 2023-12-04 DIAGNOSIS — Z8673 Personal history of transient ischemic attack (TIA), and cerebral infarction without residual deficits: Secondary | ICD-10-CM | POA: Diagnosis not present

## 2023-12-04 DIAGNOSIS — R188 Other ascites: Secondary | ICD-10-CM | POA: Diagnosis present

## 2023-12-04 DIAGNOSIS — Z9889 Other specified postprocedural states: Secondary | ICD-10-CM | POA: Diagnosis present

## 2023-12-04 DIAGNOSIS — R000 Tachycardia, unspecified: Secondary | ICD-10-CM | POA: Insufficient documentation

## 2023-12-04 DIAGNOSIS — K7031 Alcoholic cirrhosis of liver with ascites: Secondary | ICD-10-CM | POA: Diagnosis not present

## 2023-12-04 DIAGNOSIS — I5030 Unspecified diastolic (congestive) heart failure: Secondary | ICD-10-CM | POA: Diagnosis present

## 2023-12-04 DIAGNOSIS — Z79899 Other long term (current) drug therapy: Secondary | ICD-10-CM | POA: Insufficient documentation

## 2023-12-04 DIAGNOSIS — F1010 Alcohol abuse, uncomplicated: Secondary | ICD-10-CM | POA: Diagnosis present

## 2023-12-04 DIAGNOSIS — J45909 Unspecified asthma, uncomplicated: Secondary | ICD-10-CM | POA: Diagnosis not present

## 2023-12-04 DIAGNOSIS — I2510 Atherosclerotic heart disease of native coronary artery without angina pectoris: Secondary | ICD-10-CM | POA: Insufficient documentation

## 2023-12-04 DIAGNOSIS — I130 Hypertensive heart and chronic kidney disease with heart failure and stage 1 through stage 4 chronic kidney disease, or unspecified chronic kidney disease: Secondary | ICD-10-CM | POA: Insufficient documentation

## 2023-12-04 DIAGNOSIS — D696 Thrombocytopenia, unspecified: Secondary | ICD-10-CM | POA: Diagnosis present

## 2023-12-04 DIAGNOSIS — N1832 Chronic kidney disease, stage 3b: Secondary | ICD-10-CM | POA: Diagnosis present

## 2023-12-04 DIAGNOSIS — I5032 Chronic diastolic (congestive) heart failure: Secondary | ICD-10-CM | POA: Diagnosis not present

## 2023-12-04 DIAGNOSIS — I959 Hypotension, unspecified: Secondary | ICD-10-CM | POA: Diagnosis not present

## 2023-12-04 DIAGNOSIS — E440 Moderate protein-calorie malnutrition: Secondary | ICD-10-CM | POA: Diagnosis present

## 2023-12-04 DIAGNOSIS — I482 Chronic atrial fibrillation, unspecified: Secondary | ICD-10-CM | POA: Diagnosis present

## 2023-12-04 DIAGNOSIS — Z85528 Personal history of other malignant neoplasm of kidney: Secondary | ICD-10-CM | POA: Diagnosis not present

## 2023-12-04 DIAGNOSIS — K7290 Hepatic failure, unspecified without coma: Secondary | ICD-10-CM | POA: Diagnosis present

## 2023-12-04 DIAGNOSIS — K7460 Unspecified cirrhosis of liver: Secondary | ICD-10-CM | POA: Diagnosis present

## 2023-12-04 DIAGNOSIS — R109 Unspecified abdominal pain: Secondary | ICD-10-CM | POA: Diagnosis present

## 2023-12-04 DIAGNOSIS — D649 Anemia, unspecified: Secondary | ICD-10-CM | POA: Diagnosis present

## 2023-12-04 LAB — COMPREHENSIVE METABOLIC PANEL WITH GFR
ALT: 9 U/L (ref 0–44)
AST: 18 U/L (ref 15–41)
Albumin: 2.5 g/dL — ABNORMAL LOW (ref 3.5–5.0)
Alkaline Phosphatase: 64 U/L (ref 38–126)
Anion gap: 8 (ref 5–15)
BUN: 21 mg/dL (ref 8–23)
CO2: 24 mmol/L (ref 22–32)
Calcium: 8.3 mg/dL — ABNORMAL LOW (ref 8.9–10.3)
Chloride: 106 mmol/L (ref 98–111)
Creatinine, Ser: 1.8 mg/dL — ABNORMAL HIGH (ref 0.61–1.24)
GFR, Estimated: 41 mL/min — ABNORMAL LOW (ref >60)
Glucose, Bld: 72 mg/dL (ref 70–99)
Potassium: 4.6 mmol/L (ref 3.5–5.1)
Sodium: 138 mmol/L (ref 135–145)
Total Bilirubin: 0.7 mg/dL (ref 0.0–1.2)
Total Protein: 6.2 g/dL — ABNORMAL LOW (ref 6.5–8.1)

## 2023-12-04 LAB — CBC WITH DIFFERENTIAL/PLATELET
Abs Immature Granulocytes: 0.02 K/uL (ref 0.00–0.07)
Basophils Absolute: 0 K/uL (ref 0.0–0.1)
Basophils Relative: 0 %
Eosinophils Absolute: 0.1 K/uL (ref 0.0–0.5)
Eosinophils Relative: 2 %
HCT: 31.5 % — ABNORMAL LOW (ref 39.0–52.0)
Hemoglobin: 9.5 g/dL — ABNORMAL LOW (ref 13.0–17.0)
Immature Granulocytes: 0 %
Lymphocytes Relative: 11 %
Lymphs Abs: 0.5 K/uL — ABNORMAL LOW (ref 0.7–4.0)
MCH: 24.6 pg — ABNORMAL LOW (ref 26.0–34.0)
MCHC: 30.2 g/dL (ref 30.0–36.0)
MCV: 81.6 fL (ref 80.0–100.0)
Monocytes Absolute: 0.7 K/uL (ref 0.1–1.0)
Monocytes Relative: 15 %
Neutro Abs: 3.4 K/uL (ref 1.7–7.7)
Neutrophils Relative %: 72 %
Platelets: 109 K/uL — ABNORMAL LOW (ref 150–400)
RBC: 3.86 MIL/uL — ABNORMAL LOW (ref 4.22–5.81)
RDW: 17.5 % — ABNORMAL HIGH (ref 11.5–15.5)
WBC: 4.8 K/uL (ref 4.0–10.5)
nRBC: 0 % (ref 0.0–0.2)

## 2023-12-04 LAB — BRAIN NATRIURETIC PEPTIDE: B Natriuretic Peptide: 243.7 pg/mL — ABNORMAL HIGH (ref 0.0–100.0)

## 2023-12-04 LAB — GLUCOSE, CAPILLARY: Glucose-Capillary: 130 mg/dL — ABNORMAL HIGH (ref 70–99)

## 2023-12-04 LAB — I-STAT CG4 LACTIC ACID, ED
Lactic Acid, Venous: 1.2 mmol/L (ref 0.5–1.9)
Lactic Acid, Venous: 0.9 mmol/L (ref 0.5–1.9)

## 2023-12-04 MED ORDER — ALLOPURINOL 100 MG PO TABS
50.0000 mg | ORAL_TABLET | Freq: Every day | ORAL | Status: DC
  Administered 2023-12-04 – 2023-12-05 (×2): 50 mg via ORAL
  Filled 2023-12-04 (×2): qty 1

## 2023-12-04 MED ORDER — MIDODRINE HCL 5 MG PO TABS
10.0000 mg | ORAL_TABLET | Freq: Two times a day (BID) | ORAL | Status: DC
  Administered 2023-12-04 – 2023-12-05 (×4): 10 mg via ORAL
  Filled 2023-12-04 (×4): qty 2

## 2023-12-04 MED ORDER — MIDODRINE HCL 5 MG PO TABS
10.0000 mg | ORAL_TABLET | ORAL | Status: AC
  Administered 2023-12-04: 10 mg via ORAL
  Filled 2023-12-04: qty 2

## 2023-12-04 MED ORDER — ACETAMINOPHEN 325 MG PO TABS: 650.0000 mg | ORAL_TABLET | Freq: Four times a day (QID) | ORAL | Status: DC | PRN

## 2023-12-04 MED ORDER — FOLIC ACID 1 MG PO TABS
1.0000 mg | ORAL_TABLET | Freq: Every day | ORAL | Status: DC
  Administered 2023-12-04 – 2023-12-05 (×2): 1 mg via ORAL
  Filled 2023-12-04 (×2): qty 1

## 2023-12-04 MED ORDER — PANTOPRAZOLE SODIUM 40 MG PO TBEC
40.0000 mg | DELAYED_RELEASE_TABLET | Freq: Every day | ORAL | Status: DC
  Administered 2023-12-04 – 2023-12-05 (×2): 40 mg via ORAL
  Filled 2023-12-04 (×2): qty 1

## 2023-12-04 MED ORDER — ATORVASTATIN CALCIUM 40 MG PO TABS
40.0000 mg | ORAL_TABLET | Freq: Every day | ORAL | Status: DC
  Administered 2023-12-04 – 2023-12-05 (×2): 40 mg via ORAL
  Filled 2023-12-04 (×2): qty 1

## 2023-12-04 MED ORDER — ALBUMIN HUMAN 5 % IV SOLN
25.0000 g | INTRAVENOUS | Status: AC
  Administered 2023-12-04: 25 g via INTRAVENOUS
  Filled 2023-12-04: qty 500

## 2023-12-04 MED ORDER — ACETAMINOPHEN 650 MG RE SUPP: 650.0000 mg | Freq: Four times a day (QID) | RECTAL | Status: DC | PRN

## 2023-12-04 MED ORDER — FERROUS SULFATE 325 (65 FE) MG PO TABS
325.0000 mg | ORAL_TABLET | ORAL | Status: DC
  Administered 2023-12-05: 325 mg via ORAL
  Filled 2023-12-04: qty 1

## 2023-12-04 MED ORDER — ONDANSETRON HCL 4 MG PO TABS: 4.0000 mg | ORAL_TABLET | Freq: Four times a day (QID) | ORAL | Status: DC | PRN

## 2023-12-04 MED ORDER — BRIMONIDINE TARTRATE 0.15 % OP SOLN
1.0000 [drp] | Freq: Two times a day (BID) | OPHTHALMIC | Status: DC
  Administered 2023-12-04 – 2023-12-05 (×3): 1 [drp] via OPHTHALMIC
  Filled 2023-12-04: qty 5

## 2023-12-04 MED ORDER — MELATONIN 5 MG PO TABS: 5.0000 mg | ORAL_TABLET | Freq: Every day | ORAL | Status: DC

## 2023-12-04 MED ORDER — ALBUMIN HUMAN 25 % IV SOLN
50.0000 g | Freq: Once | INTRAVENOUS | Status: AC
  Administered 2023-12-04: 50 g via INTRAVENOUS
  Filled 2023-12-04: qty 200

## 2023-12-04 MED ORDER — ONDANSETRON HCL 4 MG/2ML IJ SOLN
4.0000 mg | Freq: Four times a day (QID) | INTRAMUSCULAR | Status: DC | PRN
Start: 2023-12-04 — End: 2023-12-04

## 2023-12-04 MED ORDER — PROCHLORPERAZINE EDISYLATE 10 MG/2ML IJ SOLN: 10.0000 mg | Freq: Four times a day (QID) | INTRAMUSCULAR | Status: DC | PRN

## 2023-12-04 NOTE — ED Notes (Addendum)
 Resting, watching TV, no changes.

## 2023-12-04 NOTE — H&P (Signed)
 History and Physical    Patient: John [name] FMW:[id number] DOB: Mar 06, 1956 DOA: 12/04/2023 DOS: the patient was seen and examined on 12/04/2023 PCP: Cloria Annabella CROME, DO  Patient coming from: Home  Chief Complaint:  Chief Complaint  Patient presents with   Abdominal Pain   HPI: John [name] is a 68 y.o. male with medical history significant of alcoholic cirrhosis, EtOH abuse in remission, seasonal allergies, osteoarthritis, asthma, community-acquired pneumonia, cataracts, nonobstructive CAD, chronic atrial fibrillation not on anticoagulation, chronic diastolic heart failure, chronic back pain, stage III CKD, GERD, gout, history of treated hep C, hypertension, left optic neuropathy, PAD, polysubstance abuse, primary hypertension, renal cell carcinoma, seizures, sickle cell trait, history of CVA who underwent a paracentesis yesterday afternoon and then was discharged from the hospital who is returning due to abdominal pain and persistent leakage from paracentesis puncture site.   No  nausea, emesis, diarrhea, constipation, melena or hematochezia. He denied fever, chills, rhinorrhea, sore throat, wheezing or hemoptysis. No chest pain, palpitations, diaphoresis, PND, orthopnea or pitting edema of the lower extremities.  No flank pain, dysuria, frequency or hematuria.  No polyuria, polydipsia, polyphagia or blurred vision.   Lab work: CBC showed a white count of 4.8, hemoglobin 9.5 g/dL platelets 890.  Lactic acid x 2 was normal.  BNP 244 pg/mL.  CMP showed normal electrolytes after calcium  correction.  BUN 21 and creatinine 1.80 mg/dL.  Total protein 6.2 and albumin  2.5 g/dL.  Imaging: Portable 1 view chest radiograph with no significant change from prior exam.  There is stable cardiomegaly.   ED course: Initial vital signs were temperature 97.7 F, pulse 111, respiration 19, BP 100/43 mmHg O2 sat 100% on room air.  I ordered albumin  50 g IVPB and midodrine .  Review of Systems: As  mentioned in the history of present illness. All other systems reviewed and are negative. Past Medical History:  Diagnosis Date   Allergy    Arthritis    oa back   Asthma    CAP (community acquired pneumonia) 05/23/2015   Cataract    both eyes   CHF (congestive heart failure) (HCC) 2001   sees primary for   Chronic back pain    Chronic kidney disease    ckd 3   Cirrhosis (HCC)    alcoholic with h/o varices, ascites   Coronary artery disease    nonobstrucrtibe   ETOH abuse    GERD (gastroesophageal reflux disease)    Gout    Hepatitis    hepatitic c 2018 24 week tx with ecuplipsa, no hepatitic c detected after tx   History of blood transfusion 8-9- yrs ago   Hypertension    Optic neuropathy, left    PAD (peripheral artery disease) (HCC)    slight  primary manages   Polysubstance abuse (HCC)    Primary hypertension    Renal cell carcinoma (HCC) 2016, 2018 and 2022   left ablation   Seizures [facility])    age 4 none since   Sickle cell trait (HCC)    Stroke (HCC) 1998   light no problems with   Uses walker 12/04/2020   Wears dentures    Wears glasses    Past Surgical History:  Procedure Laterality Date   ARTHRODESIS METATARSALPHALANGEAL JOINT (MTPJ) Right 12/07/2020   Procedure: ARTHRODESIS METATARSALPHALANGEAL JOINT (MTPJ);  Surgeon: Janit Thresa HERO, DPM;  Location: [facility];  Service: Podiatry;  Laterality: Right;   COLONOSCOPY  2021   ESOPHAGOGASTRODUODENOSCOPY N/A 08/14/2014  Procedure: ESOPHAGOGASTRODUODENOSCOPY (EGD);  Surgeon: Gwendlyn ONEIDA Buddy, MD;  Location: [facility];  Service: Endoscopy;  Laterality: N/A;   ESOPHAGOGASTRODUODENOSCOPY (EGD) WITH PROPOFOL  N/A 06/03/2017   Procedure: ESOPHAGOGASTRODUODENOSCOPY (EGD) WITH PROPOFOL ;  Surgeon: Buddy Gwendlyn ONEIDA, MD;  Location: [facility];  Service: Endoscopy;  Laterality: N/A;   HALLUX FUSION Left 07/28/2020   Procedure: HALLUX FUSION MPJ;  Surgeon: Janit Thresa HERO, DPM;  Location: [facility];  Service: Podiatry;  Laterality: Left;   HAMMER TOE SURGERY Left 07/28/2020   Procedure: HAMMER TOE CORRECTION  2,3,AND4 LEFT FOOT;  Surgeon: Janit Thresa HERO, DPM;  Location: [facility];  Service: Podiatry;  Laterality: Left;   HAMMER TOE SURGERY Right 12/07/2020   Procedure: HAMMER TOE CORRECTION  2-4;  Surgeon: Janit Thresa HERO, DPM;  Location: [facility];  Service: Podiatry;  Laterality: Right;   IR PARACENTESIS  09/05/2023   IR PARACENTESIS  09/16/2023   IR RADIOLOGIST EVAL & MGMT  09/25/2016   IR RADIOLOGIST EVAL & MGMT  12/10/2016   IR RADIOLOGIST EVAL & MGMT  03/25/2018   IR RADIOLOGIST EVAL & MGMT  03/24/2019   IR RADIOLOGIST EVAL & MGMT  05/17/2020   IR RADIOLOGIST EVAL & MGMT  08/03/2020   IR RADIOLOGIST EVAL & MGMT  11/22/2020   IR RADIOLOGIST EVAL & MGMT  05/31/2021   METATARSAL HEAD EXCISION Left 07/28/2020   Procedure: METATARSAL HEAD EXCISION TOES 2,3,AND 4  LEFT FOOT;  Surgeon: Janit Thresa HERO, DPM;  Location: [facility];  Service: Podiatry;  Laterality: Left;   METATARSAL OSTEOTOMY Right 12/07/2020   Procedure: METATARSAL OSTEOTOMY TOES 2-4 RIGHT FOOT;  Surgeon: Janit Thresa HERO, DPM;  Location: [facility];  Service: Podiatry;  Laterality: Right;   none     RADIOFREQUENCY ABLATION Left 11/08/2016   Procedure: LEFT RENAL CRYOABLATION;  Surgeon: Vanice Sharper, MD;  Location: [facility];  Service: Anesthesiology;  Laterality: Left;   RADIOLOGY WITH ANESTHESIA N/A 08/15/2014   Procedure: RADIOLOGY WITH ANESTHESIA;  Surgeon: Sharper Vanice, MD;  Location: [facility];  Service: Radiology;  Laterality: N/A;   RADIOLOGY WITH ANESTHESIA Left 07/05/2020   Procedure: CT CRYOABLATION;  Surgeon: Vanice Sharper, MD;  Location: [facility];  Service: Anesthesiology;  Laterality: Left;   UPPER GASTROINTESTINAL ENDOSCOPY     Social History:  reports that he quit smoking about 12 years ago. His smoking use included cigarettes. He started  smoking about 32 years ago. He has a 6 pack-year smoking history. He has never used smokeless tobacco. He reports current drug use. Drugs: Cocaine and Marijuana. He reports that he does not drink alcohol.  Allergies  Allergen Reactions   Penicillins Other (See Comments)    Convulsions, Patient could not walk, childhood allergy     Family History  Problem Relation Age of Onset   Hypertension Mother        Living   Kidney disease Mother    Diabetes Mother    Heart disease Mother    Hypertension Father        Deceased, 79   Ulcers Father    Stomach cancer Father    Hypertension Brother    Diabetes Brother    Kidney disease Brother    Hypertension Sister    Colon cancer Neg Hx    Esophageal cancer Neg Hx    Rectal cancer Neg Hx     Prior to Admission medications   Medication Sig Start Date End Date Taking? Authorizing Provider  acetaminophen  (TYLENOL ) 325 MG tablet Take 650 mg by mouth every 8 (eight) hours as needed for mild pain (pain score 1-3) or moderate pain (pain score 4-6). Do not exceed 6 tabs per day.    [provider]  allopurinol  (ZYLOPRIM ) 100 MG tablet Take 0.5 tablets (50 mg total) by mouth daily. 09/06/23 12/05/23  [place], Eric J, DO  atorvastatin  (LIPITOR) 40 MG tablet Take 1 tablet (40 mg total) by mouth daily. 09/06/23 12/05/23  [place], Eric J, DO  brimonidine  (ALPHAGAN ) 0.15 % ophthalmic solution Place 1 drop into both eyes 2 (two) times daily. 09/06/23   [place], Eric J, DO  FEROSUL 325 (65 Fe) MG tablet Take 1 tablet (325 mg total) by mouth every Monday, Wednesday, and Friday. 09/08/23 12/07/23  [place], Eric J, DO  FLOVENT  HFA 220 MCG/ACT inhaler Inhale 2 puffs into the lungs in the morning and at bedtime. 12/03/23   Will Almarie MATSU, MD  folic acid  (FOLVITE ) 1 MG tablet Take 1 tablet (1 mg total) by mouth daily. 10/15/23 01/15/24  Kingsley, Victoria K, DO  furosemide  (LASIX ) 20 MG tablet Take 2 tablets (40 mg total) by mouth daily. 12/03/23   Will Almarie MATSU, MD  hydroxypropyl methylcellulose / hypromellose (ISOPTO TEARS / GONIOVISC) 2.5 % ophthalmic solution Place 1 drop into the right eye as needed for dry eyes.    [provider]  ketoconazole  (NIZORAL ) 2 % shampoo Apply 1 Application topically See admin instructions. Shampoo the scalp 2 times a week    [provider]  melatonin 5 MG TABS Take 1 tablet (5 mg total) by mouth at bedtime. 09/06/23 12/05/23  [place], Eric J, DO  Menthol -Methyl Salicylate  (SALONPAS  PAIN RELIEF  PATCH) PTCH Apply 1 patch topically daily as needed (for mid-back pain and lower back pain).    [provider]  metoprolol  succinate (TOPROL  XL) 25 MG 24 hr tablet Take 0.5 tablets (12.5 mg total) by mouth daily. 12/03/23 03/02/24  Will Almarie MATSU, MD  omeprazole  (PRILOSEC) 40 MG capsule Take 1 capsule (40 mg total) by mouth daily before breakfast. 10/15/23 01/15/24  Kingsley, Victoria K, DO  albuterol  (PROAIR  HFA) 108 (90 Base) MCG/ACT inhaler Inhale 2 puffs into the lungs every 6 (six) hours as needed for wheezing or shortness of breath. 12/03/23   Will Almarie MATSU, MD  spironolactone  (ALDACTONE ) 50 MG tablet Take 0.5 tablets (25 mg total) by mouth daily. 12/03/23 03/02/24  Will Almarie MATSU, MD    Physical Exam: Vitals:   12/04/23 1045 12/04/23 1100 12/04/23 1115 12/04/23 1118  BP: (!) 81/61 93/67 (!) 88/69   Pulse: 91 74    Resp: 20 20 (!) 23   Temp:      TempSrc:      SpO2: 93% 98% 95% 95%  Weight:       Physical Exam Vitals reviewed.  Constitutional:      General: He is awake. He is not in acute distress.    Appearance: He is ill-appearing.  HENT:     Head: Normocephalic.     Nose: No rhinorrhea.     Mouth/Throat:     Mouth: Mucous membranes are moist.  Eyes:     General: No scleral icterus.    Pupils: Pupils are equal, round, and reactive to light.  Neck:     Vascular: No JVD.  Cardiovascular:     Rate and Rhythm: Normal rate and regular rhythm.     Heart sounds:  S1 normal and S2 normal.  Pulmonary:  Effort: Pulmonary effort is normal.     Breath sounds: Normal breath sounds. No wheezing, rhonchi or rales.  Abdominal:     General: Abdomen is protuberant. Bowel sounds are normal. There is distension.     Palpations: Abdomen is soft.     Tenderness: There is abdominal tenderness in the periumbilical area and left lower quadrant. There is no right CVA tenderness, left CVA tenderness, guarding or rebound.  Musculoskeletal:     Cervical back: Neck supple.     Right lower leg: No edema.     Left lower leg: No edema.  Skin:    General: Skin is warm and dry.  Neurological:     Mental Status: He is alert.  Psychiatric:        Behavior: Behavior is cooperative.     Data Reviewed:  Results are pending, will review when available.  05/10/2023 echocardiogram report. MPRESSIONS:   1. Left ventricular ejection fraction, by estimation, is 55 to 60%. The left ventricle has normal function. The left ventricle has no regional wall motion abnormalities. There is mild left ventricular hypertrophy. Left ventricular diastolic parameters are indeterminate.  2. Right ventricular systolic function is low normal. The right ventricular size is normal. There is normal pulmonary artery systolic pressure.  3. Left atrial size was moderately dilated.  4. The mitral valve is normal in structure. Mild mitral valve regurgitation.  5. The aortic valve is normal in structure. Aortic valve regurgitation is not visualized.  6. Aortic dilatation noted. There is moderate dilatation of the aortic root, measuring 44 mm. There is mild dilatation of the ascending aorta, measuring 42 mm.  EKG: Vent. rate 101 BPM PR interval * ms QRS duration 86 ms QT/QTcB 399/518 ms P-R-T axes * 83 264 Atrial flutter/fibrillation Borderline right axis deviation Nonspecific T abnormalities, diffuse leads Prolonged QT interval  Assessment and Plan: Principal Problem:    Hypotension (arterial) Observation/telemetry. Albumin  50 g IVPB x 1. Midodrine  10 mg p.o. twice daily.  Active Problems:   Status post abdominal paracentesis Due to:   Abdominal ascites In the setting of:   Decompensated cirrhosis (HCC) Discussed with the IR team. Will try Dermabond on the paracentesis puncture site. Patient to lie down on the contralateral side. Given significant ascites, may need another paracentesis.    Stage 3b chronic kidney disease (HCC) Monitor renal function and electrolytes.    Alcohol abuse In remission according to the patient.    (HFpEF) heart failure with preserved ejection fraction (HCC) Holding beta-blocker and diuretics due to hypotension.    Malnutrition of moderate degree Secondary to liver cirrhosis. Monitor protein level. Consider nutritional services evaluation.    Chronic anemia Monitor hematocrit hemoglobin.    Thrombocytopenia (HCC) Monitor platelet count.    Chronic atrial fibrillation (HCC) Not on anticoagulation. Hold metoprolol  for now.    Advance Care Planning:   Code Status: Full Code   Consults:   Family Communication:   Severity of Illness: The appropriate patient status for this patient is OBSERVATION. Observation status is judged to be reasonable and necessary in order to provide the required intensity of service to ensure the patient's safety. The patient's presenting symptoms, physical exam findings, and initial radiographic and laboratory data in the context of their medical condition is felt to place them at decreased risk for further clinical deterioration. Furthermore, it is anticipated that the patient will be medically stable for discharge from the hospital within 2 midnights of admission.   Author: Alm Dorn Castor, MD 12/04/2023  11:50 AM  For on call review [email or web address].   This document was prepared using Dragon voice recognition software and may contain some unintended transcription errors.

## 2023-12-04 NOTE — ED Notes (Signed)
 Xray at [facility]

## 2023-12-04 NOTE — ED Triage Notes (Signed)
 BIB [facility] from home for abd pain and distention, and leakage from recent paracentesis site 2d ago. H/o similar. Sx recurrent. Pulled 6L off 2d ago. C/o leakage from paracentesis site L abd. Surrounding sheets/ pants saturated. BP noted to be 86/58, NS IVF 250 cc given, BP improved to 112/60. HR 90-110 afib with h/o afib. Alert, NAD, calm, interactive, resps e/u, speaking in clear complete sentences. Asking for TV remote control. States occasional grunting makes me feel better. NSK 20g L wrist.

## 2023-12-04 NOTE — ED Notes (Signed)
 EKG in progress. No changes. Alert, NAD, calm, interactive, resps e/u, VSS.

## 2023-12-04 NOTE — ED Provider Notes (Signed)
 [place] EMERGENCY DEPARTMENT AT [facility] Provider Note   CSN: [id number] Arrival date & time: 12/04/23  9245   Patient presents with: Abdominal Pain  John [name] is a 68 y.o. male.   -Abdomen started leaking fluid this morning around 0200 from recent paracentesis site -Fluid appears clear  -Little stomach pain, little nausea  -No vomiting, no diarrhea  -No fever -Felt some SOB when first noticed the leaking  -No current SOB or CP  -Has not taken home meds recently   Abdominal Pain Associated symptoms: shortness of breath   Associated symptoms: no chest pain, no chills and no fever        Prior to Admission medications   Medication Sig Start Date End Date Taking? Authorizing Provider  acetaminophen  (TYLENOL ) 325 MG tablet Take 650 mg by mouth every 8 (eight) hours as needed for mild pain (pain score 1-3) or moderate pain (pain score 4-6). Do not exceed 6 tabs per day.    [provider]  allopurinol  (ZYLOPRIM ) 100 MG tablet Take 0.5 tablets (50 mg total) by mouth daily. 09/06/23 12/05/23  [place], Camellia PARAS, DO  atorvastatin  (LIPITOR) 40 MG tablet Take 1 tablet (40 mg total) by mouth daily. 09/06/23 12/05/23  [place], Camellia PARAS, DO  brimonidine  (ALPHAGAN ) 0.15 % ophthalmic solution Place 1 drop into both eyes 2 (two) times daily. 09/06/23   [place], Eric J, DO  FEROSUL 325 (65 Fe) MG tablet Take 1 tablet (325 mg total) by mouth every Monday, Wednesday, and Friday. 09/08/23 12/07/23  [place], Eric J, DO  FLOVENT  HFA 220 MCG/ACT inhaler Inhale 2 puffs into the lungs in the morning and at bedtime. 12/03/23   Will Almarie MATSU, MD  folic acid  (FOLVITE ) 1 MG tablet Take 1 tablet (1 mg total) by mouth daily. 10/15/23 01/15/24  Kingsley, Victoria K, DO  furosemide  (LASIX ) 20 MG tablet Take 2 tablets (40 mg total) by mouth daily. 12/03/23   Will Almarie MATSU, MD  hydroxypropyl methylcellulose / hypromellose (ISOPTO TEARS / GONIOVISC) 2.5 % ophthalmic solution Place 1  drop into the right eye as needed for dry eyes.    [provider]  ketoconazole  (NIZORAL ) 2 % shampoo Apply 1 Application topically See admin instructions. Shampoo the scalp 2 times a week    [provider]  melatonin 5 MG TABS Take 1 tablet (5 mg total) by mouth at bedtime. 09/06/23 12/05/23  [place], Eric J, DO  Menthol -Methyl Salicylate  (SALONPAS  PAIN RELIEF  PATCH) PTCH Apply 1 patch topically daily as needed (for mid-back pain and lower back pain).    [provider]  metoprolol  succinate (TOPROL  XL) 25 MG 24 hr tablet Take 0.5 tablets (12.5 mg total) by mouth daily. 12/03/23 03/02/24  Will Almarie MATSU, MD  omeprazole  (PRILOSEC) 40 MG capsule Take 1 capsule (40 mg total) by mouth daily before breakfast. 10/15/23 01/15/24  Kingsley, Victoria K, DO  albuterol  (PROAIR  HFA) 108 (90 Base) MCG/ACT inhaler Inhale 2 puffs into the lungs every 6 (six) hours as needed for wheezing or shortness of breath. 12/03/23   Will Almarie MATSU, MD  spironolactone  (ALDACTONE ) 50 MG tablet Take 0.5 tablets (25 mg total) by mouth daily. 12/03/23 03/02/24  Will Almarie MATSU, MD    Allergies: Penicillins    Review of Systems  Constitutional:  Negative for chills and fever.  Respiratory:  Positive for shortness of breath.   Cardiovascular:  Negative for chest pain.  Gastrointestinal:  Positive for abdominal pain.  Neurological:  Negative for  dizziness.    Updated Vital Signs BP (!) 82/69   Pulse 74   Temp (!) 97.4 F (36.3 C) (Oral)   Resp 17   Wt 95.3 kg   SpO2 98%   BMI 31.93 kg/m   Physical Exam Cardiovascular:     Rate and Rhythm: Regular rhythm. Tachycardia present.     Heart sounds: Normal heart sounds.  Pulmonary:     Effort: Pulmonary effort is normal. No respiratory distress.     Comments: Crackles of lower lobes bilaterally.  Abdominal:     General: Abdomen is protuberant. Bowel sounds are normal. There is distension.     Palpations: Abdomen is rigid.      Tenderness: There is abdominal tenderness in the left upper quadrant and left lower quadrant.     Comments: Bandaged recent paracentesis site of LLQ with leaking of serosanguinous fluid.   Skin:    General: Skin is warm.  Neurological:     Mental Status: He is alert.     (all labs ordered are listed, but only abnormal results are displayed) Labs Reviewed  COMPREHENSIVE METABOLIC PANEL WITH GFR - Abnormal; Notable for the following components:      Result Value   Creatinine, Ser 1.80 (*)    Calcium  8.3 (*)    Total Protein 6.2 (*)    Albumin  2.5 (*)    GFR, Estimated 41 (*)    All other components within normal limits  BRAIN NATRIURETIC PEPTIDE - Abnormal; Notable for the following components:   B Natriuretic Peptide 243.7 (*)    All other components within normal limits  CBC WITH DIFFERENTIAL/PLATELET - Abnormal; Notable for the following components:   RBC 3.86 (*)    Hemoglobin 9.5 (*)    HCT 31.5 (*)    MCH 24.6 (*)    RDW 17.5 (*)    Platelets 109 (*)    Lymphs Abs 0.5 (*)    All other components within normal limits  I-STAT CG4 LACTIC ACID, ED  I-STAT CG4 LACTIC ACID, ED    EKG: EKG Interpretation Date/Time:  Thursday December 04 2023 09:16:59 EDT Ventricular Rate:  101 PR Interval:    QRS Duration:  86 QT Interval:  399 QTC Calculation: 518 R Axis:   83  Text Interpretation: Atrial flutter/fibrillation Borderline right axis deviation Nonspecific T abnormalities, diffuse leads Prolonged QT interval when compared to prior, similar appearance with longer QTc No STEMI Confirmed by Ginger Barefoot ([id number]) on 12/04/2023 10:08:49 AM  Radiology: DG Chest Portable 1 View Result Date: 12/04/2023 CLINICAL DATA:  Dyspnea. EXAM: PORTABLE CHEST 1 VIEW COMPARISON:  12/02/2023. FINDINGS: Stable cardiomegaly with similar enlargement of the pulmonary arteries at the hilum, as can be seen with pulmonary hypertension. Aortic atherosclerosis. No focal consolidation, pleural effusion, or  pneumothorax. No acute osseous abnormality. IMPRESSION: 1. No significant change from the prior exam. 2. Stable cardiomegaly. Electronically Signed   By: Harrietta Sherry M.D.   On: 12/04/2023 10:27   US  Paracentesis Result Date: 12/03/2023 INDICATION: [id number] Ascites [id number] Patient with history of alcoholic cirrhosis, heart failure, chronic kidney disease, hepatitis-C, renal cell carcinoma with prior cryoablation, recurrent ascites. Request received for therapeutic paracentesis. EXAM: ULTRASOUND GUIDED THERAPEUTIC PARACENTESIS MEDICATIONS: 8 ml  1 % lidocaine  with epinephrine  COMPLICATIONS: None immediate. PROCEDURE: Informed written consent was obtained from the patient after a discussion of the risks, benefits and alternatives to treatment. A timeout was performed prior to the initiation of the procedure. Initial ultrasound scanning demonstrates a large amount of  ascites within the left lower abdominal quadrant. The left lower abdomen was prepped and draped in the usual sterile fashion. 1% lidocaine  was used for local anesthesia. Following this, a 19 gauge, 10-cm, Yueh catheter was introduced. An ultrasound image was saved for documentation purposes. The paracentesis was performed. The catheter was removed and a dressing was applied. The patient tolerated the procedure well without immediate post procedural complication. FINDINGS: A total of approximately 6 liters of slightly hazy, yellow fluid was removed. IMPRESSION: Successful ultrasound-guided therapeutic paracentesis yielding 6.0 L of peritoneal fluid. Performed by: Franky Kelsie RIGGERS PLAN: The patient has previously been formally evaluated by the [facility] and is being actively followed for potential future intervention. Thom Hall, MD [facility] Radiology Electronically Signed   By: Thom Hall M.D.   On: 12/03/2023 15:42   DG Chest Portable 1  View Result Date: 12/02/2023 EXAM: 1 VIEW XRAY OF THE CHEST 12/02/2023 03:46:00 PM COMPARISON: Chest radiograph dated 11/12/2023 end of report cervical . CLINICAL HISTORY: Worsening ascites, shortness of breath. FINDINGS: LUNGS AND PLEURA: No focal pulmonary opacity. Chronic-appearing pulmonary vascular congestion. No pulmonary edema. No pleural effusion. No pneumothorax. HEART AND MEDIASTINUM: Similarly enlarged cardiomediastinal silhouette compared to prior study. BONES AND SOFT TISSUES: No acute osseous abnormality. IMPRESSION: 1. No acute findings. 2. Unchanged cardiomegaly. Electronically signed by: Limin Xu MD 12/02/2023 04:01 PM EDT RP Workstation: [id number]     Procedures   Medications Ordered in the ED  midodrine  (PROAMATINE ) tablet 10 mg (10 mg Oral Given 12/04/23 1218)  albumin  human 25 % solution 50 g (has no administration in time range)                                    Medical Decision Making Amount and/or Complexity of Data Reviewed Labs: ordered. Radiology: ordered.  Risk Decision regarding hospitalization.   John [name] is a 68 y.o. male is here with fluid leaking from recent paracentesis.    Lab Tests:  CBC: Hgb 9.5 (around baseline) CMP: Albumin  2.5, Protein 6.2 BNP 243 Lactic acid 1.2 > 0.9 EKG with afib   Imaging Studies ordered:  CXR: No acute findings.   Plan Patient arriving for leaking fluid from abdomen. Was recently discharged (12/02/23) for ascites 2/2 alcoholic cirrhosis s/p paracentesis. On arrival to ED, patient with serosanguinous fluid leaking from recent paracentesis site. BNP elevated with signs of fluid overload on exam. Patient afebrile without leukocytosis, abdomen non-infectious appearing on exam. Low albumin  and low protein on CMP with intermittent low BP, suspect decreased oncotic pressure from leaking fluid. Received small bolus of 250ml fluid in ED with mild improvement. Patient also with known Afib, with intermittent RVR while in  ED. Consult to hospitalist who will admit patient for observation and fluid management.    Final diagnoses:  Ascites due to alcoholic cirrhosis (HCC)      Diona Perkins, MD 12/04/23 1242    Tegeler, Lonni PARAS, MD 12/04/23 [phone]

## 2023-12-04 NOTE — ED Notes (Signed)
 EDP at [facility]

## 2023-12-05 ENCOUNTER — Encounter (HOSPITAL_COMMUNITY): Payer: Self-pay | Admitting: *Deleted

## 2023-12-05 ENCOUNTER — Other Ambulatory Visit (HOSPITAL_COMMUNITY): Payer: Self-pay

## 2023-12-05 DIAGNOSIS — I959 Hypotension, unspecified: Secondary | ICD-10-CM | POA: Diagnosis not present

## 2023-12-05 LAB — COMPREHENSIVE METABOLIC PANEL WITH GFR
ALT: 8 U/L (ref 0–44)
AST: 17 U/L (ref 15–41)
Albumin: 3.1 g/dL — ABNORMAL LOW (ref 3.5–5.0)
Alkaline Phosphatase: 58 U/L (ref 38–126)
Anion gap: 7 (ref 5–15)
BUN: 25 mg/dL — ABNORMAL HIGH (ref 8–23)
CO2: 26 mmol/L (ref 22–32)
Calcium: 8.8 mg/dL — ABNORMAL LOW (ref 8.9–10.3)
Chloride: 105 mmol/L (ref 98–111)
Creatinine, Ser: 1.95 mg/dL — ABNORMAL HIGH (ref 0.61–1.24)
GFR, Estimated: 37 mL/min — ABNORMAL LOW (ref >60)
Glucose, Bld: 98 mg/dL (ref 70–99)
Potassium: 5 mmol/L (ref 3.5–5.1)
Sodium: 138 mmol/L (ref 135–145)
Total Bilirubin: 0.9 mg/dL (ref 0.0–1.2)
Total Protein: 6.5 g/dL (ref 6.5–8.1)

## 2023-12-05 LAB — CBC
HCT: 30.6 % — ABNORMAL LOW (ref 39.0–52.0)
Hemoglobin: 9.2 g/dL — ABNORMAL LOW (ref 13.0–17.0)
MCH: 24.3 pg — ABNORMAL LOW (ref 26.0–34.0)
MCHC: 30.1 g/dL (ref 30.0–36.0)
MCV: 81 fL (ref 80.0–100.0)
Platelets: 109 K/uL — ABNORMAL LOW (ref 150–400)
RBC: 3.78 MIL/uL — ABNORMAL LOW (ref 4.22–5.81)
RDW: 17.5 % — ABNORMAL HIGH (ref 11.5–15.5)
WBC: 4.3 K/uL (ref 4.0–10.5)
nRBC: 0 % (ref 0.0–0.2)

## 2023-12-05 MED ORDER — ATORVASTATIN CALCIUM 40 MG PO TABS
40.0000 mg | ORAL_TABLET | Freq: Every day | ORAL | 0 refills | Status: DC
  Filled 2023-12-05: qty 90, 90d supply, fill #0

## 2023-12-05 MED ORDER — SPIRONOLACTONE 50 MG PO TABS
25.0000 mg | ORAL_TABLET | Freq: Every day | ORAL | 2 refills | Status: DC
  Filled 2023-12-05: qty 15, 30d supply, fill #0

## 2023-12-05 MED ORDER — FUROSEMIDE 20 MG PO TABS
40.0000 mg | ORAL_TABLET | Freq: Every day | ORAL | 2 refills | Status: DC
  Filled 2023-12-05: qty 60, 30d supply, fill #0

## 2023-12-05 MED ORDER — HYPROMELLOSE (GONIOSCOPIC) 2.5 % OP SOLN
1.0000 [drp] | OPHTHALMIC | 12 refills | Status: DC | PRN
  Filled 2023-12-05: qty 15, fill #0

## 2023-12-05 MED ORDER — ALLOPURINOL 100 MG PO TABS
50.0000 mg | ORAL_TABLET | Freq: Every day | ORAL | 0 refills | Status: DC
  Filled 2023-12-05: qty 45, 90d supply, fill #0

## 2023-12-05 MED ORDER — FEROSUL 325 (65 FE) MG PO TABS
325.0000 mg | ORAL_TABLET | ORAL | 0 refills | Status: DC
  Filled 2023-12-05: qty 36, 84d supply, fill #0

## 2023-12-05 MED ORDER — MIDODRINE HCL 10 MG PO TABS
10.0000 mg | ORAL_TABLET | Freq: Three times a day (TID) | ORAL | 0 refills | Status: DC
  Filled 2023-12-05: qty 90, 30d supply, fill #0

## 2023-12-05 MED ORDER — METOPROLOL SUCCINATE ER 25 MG PO TB24
12.5000 mg | ORAL_TABLET | Freq: Every day | ORAL | 0 refills | Status: DC
  Filled 2023-12-05: qty 45, 90d supply, fill #0

## 2023-12-05 MED ORDER — MELATONIN 5 MG PO TABS: 5.0000 mg | ORAL_TABLET | Freq: Every day | ORAL | Status: DC

## 2023-12-05 MED ORDER — FOLIC ACID 1 MG PO TABS
1.0000 mg | ORAL_TABLET | Freq: Every day | ORAL | 0 refills | Status: DC
  Filled 2023-12-05: qty 90, 90d supply, fill #0

## 2023-12-05 MED ORDER — ACETAMINOPHEN 325 MG PO TABS: 650.0000 mg | ORAL_TABLET | Freq: Three times a day (TID) | ORAL | Status: DC | PRN

## 2023-12-05 MED ORDER — FLUTICASONE PROPIONATE HFA 220 MCG/ACT IN AERO
2.0000 | INHALATION_SPRAY | Freq: Two times a day (BID) | RESPIRATORY_TRACT | 12 refills | Status: DC
  Filled 2023-12-05 – 2023-12-08 (×2): qty 12, 30d supply, fill #0

## 2023-12-05 MED ORDER — OMEPRAZOLE 40 MG PO CPDR
40.0000 mg | DELAYED_RELEASE_CAPSULE | Freq: Every day | ORAL | 0 refills | Status: DC
  Filled 2023-12-05: qty 90, 90d supply, fill #0

## 2023-12-05 MED ORDER — BRIMONIDINE TARTRATE 0.2 % OP SOLN
1.0000 [drp] | Freq: Two times a day (BID) | OPHTHALMIC | 12 refills | Status: DC
  Filled 2023-12-05: qty 5, 50d supply, fill #0

## 2023-12-05 MED ORDER — ALBUTEROL SULFATE HFA 108 (90 BASE) MCG/ACT IN AERS
2.0000 | INHALATION_SPRAY | Freq: Four times a day (QID) | RESPIRATORY_TRACT | 4 refills | Status: DC | PRN
  Filled 2023-12-05: qty 6.7, 17d supply, fill #0

## 2023-12-05 NOTE — Progress Notes (Signed)
 Paracentesis puncture site still leaking. Dressing changed.

## 2023-12-05 NOTE — Care Management Obs Status (Signed)
 MEDICARE OBSERVATION STATUS NOTIFICATION   Patient Details  Name: John [name] MRN: [id number] Date of Birth: 03/14/1956   Medicare Observation Status Notification Given:  Yes    Duwaine GORMAN Aran, LCSW 12/05/2023, 11:45 AM

## 2023-12-05 NOTE — Progress Notes (Signed)
 AVS reviewed w/ patient who verbalized an understanding. Paper scrubs given to pt - PIV removed as noted. Discharge meds in a secure bag delivered to pt by this RN. Pt's ride enroute

## 2023-12-05 NOTE — TOC Initial Note (Signed)
 Transition of Care [facility]) - Initial/Assessment Note   Patient Details  Name: John [name] MRN: [id number] Date of Birth: 1955/05/10  Transition of Care [facility] CM/SW Contact:    Duwaine GORMAN Aran, LCSW Phone Number: 12/05/2023, 1:19 PM  Clinical Narrative: Patient is from home alone. Patient confirmed he is still active with [facility]. [facility] usually provides [facility] for patient when discharging home. Care management to follow.  Expected Discharge Plan: Home/Self Care Barriers to Discharge: Continued Medical Work up  Patient Goals and CMS Choice Patient states their goals for this hospitalization and ongoing recovery are:: Return home Choice offered to / list presented to : NA  Expected Discharge Plan and Services In-house Referral: Clinical Social Work Post Acute Care Choice: NA Living arrangements for the past 2 months: Apartment          DME Arranged: N/A DME Agency: NA  Prior Living Arrangements/Services Living arrangements for the past 2 months: Apartment Lives with:: Self Patient language and need for interpreter reviewed:: Yes Do you feel safe going back to the place where you live?: Yes      Need for Family Participation in Patient Care: No (Comment) Care giver support system in place?: Yes (comment) Criminal Activity/Legal Involvement Pertinent to Current Situation/Hospitalization: No - Comment as needed  Activities of Daily Living ADL Screening (condition at time of admission) Independently performs ADLs?: No Does the patient have a NEW difficulty with bathing/dressing/toileting/self-feeding that is expected to last >3 days?: No Does the patient have a NEW difficulty with getting in/out of bed, walking, or climbing stairs that is expected to last >3 days?: No Does the patient have a NEW difficulty with communication that is expected to last >3 days?: No Is the patient deaf or have difficulty hearing?: No Does the patient have difficulty seeing, even when  wearing glasses/contacts?: No Does the patient have difficulty concentrating, remembering, or making decisions?: No  Permission Sought/Granted Permission granted to share information with : Yes, Verbal Permission Granted Permission granted to share info w AGENCY: [facility]  Emotional Assessment Appearance:: Appears stated age Attitude/Demeanor/Rapport: Engaged Affect (typically observed): Accepting Orientation: : Oriented to Self, Oriented to Place, Oriented to  Time, Oriented to Situation Alcohol / Substance Use: Not Applicable Psych Involvement: No (comment)  Admission diagnosis:  Hypotension (arterial) [I95.9] Ascites due to alcoholic cirrhosis (HCC) [K70.31] Patient Active Problem List   Diagnosis Date Noted   Hypotension (arterial) 12/04/2023   Status post abdominal paracentesis 12/04/2023   Chronic atrial fibrillation (HCC) 12/04/2023   Abdominal ascites 12/02/2023   Bilateral lower extremity edema 10/16/2023   Acute on chronic diastolic (congestive) heart failure (HCC) 10/15/2023   Chronic anemia 07/01/2023   Acute exacerbation of CHF (congestive heart failure) (HCC) 06/30/2023   History of hepatitis C 05/14/2023   Portal hypertension (HCC) 05/14/2023   Decompensated cirrhosis (HCC) 05/14/2023   Atrial flutter (HCC) 05/12/2023   Hypomagnesemia 05/08/2023   New onset atrial fibrillation (HCC) 05/07/2023   Stasis dermatitis of both legs 08/05/2022   Glaucoma (increased eye pressure) 08/05/2022   COPD with acute exacerbation (HCC) 08/05/2022   Acute exacerbation of congestive heart failure (HCC) 07/31/2022   Renal mass, left 07/05/2020   Umbilical hernia without obstruction and without gangrene 04/27/2019   Mild intermittent asthma without complication 04/27/2019   Severe obesity (BMI 35.0-39.9) with comorbidity (HCC) 06/25/2018   Acute gouty arthritis 05/06/2018   Polyarthralgia 05/03/2018   History of renal cell carcinoma 01/12/2018   Esophageal varices  without bleeding (HCC)    Renal cell carcinoma of left kidney (HCC) 11/08/2016   Malnutrition of moderate degree 07/20/2015   (HFpEF) heart failure with preserved ejection fraction (HCC) 07/19/2015   Abdominal pain 07/19/2015   Renal failure (ARF), acute on chronic (HCC) 07/19/2015   Acute-on-chronic kidney injury (HCC)    Hyponatremia 07/07/2015   Dehydration 07/07/2015   Acute respiratory failure (HCC) 07/07/2015   Acute respiratory failure with hypoxia (HCC) 05/23/2015   Fluid overload 10/31/2014   Hepatitis C 10/31/2014   Hepatitis, viral    Edema of abdominal wall    Pedal edema    Alcoholism (HCC)    Abdominal edema on examination 09/01/2014   Chronic liver failure (HCC)    Alcoholic cirrhosis of liver with ascites (HCC)    Chronic hepatitis C with cirrhosis (HCC)    Cardiac hypertrophy    Fever of unknown origin    Diastolic dysfunction    Altered mental status    Metabolic encephalopathy    Pulmonary edema    Acute encephalopathy    Ascites due to alcoholic cirrhosis (HCC)    Hypernatremia    Other specified fever    Varices, gastric    Acute on chronic renal failure (HCC)    Elevated transaminase level    Primary hypertension    Gastric varices    Upper GI bleed 08/14/2014   Acute blood loss anemia 08/14/2014   Alcohol abuse 08/14/2014   Coagulopathy (HCC) 08/14/2014   AKI (acute kidney injury) (HCC) 08/14/2014   GI bleed 08/14/2014   GIB (gastrointestinal bleeding) 08/14/2014   Bleeding gastric varices 08/14/2014   Acute upper GI bleed    Cirrhosis (HCC) 07/27/2014   Abdominal pain, generalized 07/27/2014   Ascites 07/26/2014   Optic neuritis 07/11/2014   Abnormal LFTs    Vision loss of left eye 06/24/2014   Accelerated hypertension 06/24/2014   Elevated LFTs 06/24/2014   Polysubstance abuse (HCC) 06/24/2014   Stage 3b chronic kidney disease (HCC) 06/24/2014   SOB (shortness of breath) 06/24/2014   Vision loss, left eye 06/24/2014   Thrombocytopenia  (HCC) 06/24/2014   Hypokalemia 06/24/2014   Elevated troponin 06/24/2014   Neck pain 12/29/2012   Low back pain 12/29/2012   PCP:  Cloria Annabella CROME, DO Pharmacy:   [facility] [address] [place] [address] Phone: [phone] Fax: [phone]  [facility] [address] [place] [address] Phone: [phone] Fax: [phone]  [name] [address] [place] [address] Phone: [phone] Fax: [phone]  Social Drivers of Health (SDOH) Social History: SDOH Screenings   Food Insecurity: No Food Insecurity (12/04/2023)  Housing: Low Risk  (12/04/2023)  Transportation Needs: No Transportation Needs (12/04/2023)  Utilities: Not At Risk (12/04/2023)  Social Connections: Moderately Integrated (12/04/2023)  Tobacco Use: Medium Risk (12/04/2023)   SDOH Interventions:    Readmission Risk Interventions    10/17/2023   12:18 PM 07/02/2023    2:22 PM  Readmission Risk Prevention Plan  Transportation Screening Complete Complete  Medication Review Oceanographer) Complete Complete  HRI or Home Care Consult Complete Complete  SW Recovery Care/Counseling Consult Complete Complete  Palliative Care Screening Not Applicable Not Applicable  Skilled Nursing Facility Not Applicable Complete

## 2023-12-05 NOTE — Discharge Summary (Signed)
 Physician Discharge Summary  John [name] FMW:[id number] DOB: 02-14-1956 DOA: 12/04/2023  PCP: Cloria Annabella CROME, DO  Admit date: 12/04/2023 Discharge date: 12/05/2023  Admitted From: Home  Discharge disposition: Home    Recommendations for Outpatient Follow-Up:   Follow up with your primary care provider in one week.  Check CBC, BMP, magnesium , LFTS in the next visit  Discharge Diagnosis:   Principal Problem:   Hypotension (arterial) Active Problems:   Stage 3b chronic kidney disease (HCC)   Thrombocytopenia (HCC)   Alcohol abuse   (HFpEF) heart failure with preserved ejection fraction (HCC)   Malnutrition of moderate degree   Decompensated cirrhosis (HCC)   Chronic anemia   Abdominal ascites   Status post abdominal paracentesis   Chronic atrial fibrillation (HCC)    Discharge Condition: Improved.  Diet recommendation: Low sodium, heart healthy.   Wound care: None.  Code status: Full.   History of Present Illness:   John [name] is a 68 y.o. male with medical history significant of alcoholic cirrhosis, EtOH abuse in remission, asthma, nonobstructive CAD, chronic atrial fibrillation not on anticoagulation, chronic diastolic heart failure, stage III CKD, GERD, gout, history of treated hep C, hypertension,  PAD, polysubstance abuse, primary hypertension, renal cell carcinoma, seizures, sickle cell trait, history of CVA presented to the hospital with persistent leakage from the [facility] centesis puncture site.  Of note, patient had undergone paracentesis paracentesis 1 day prior to this presentation and had been discharged from the hospital.  In the ED, patient was hypotensive and slightly tachycardic.  Labs were remarkable for platelet low at 109 and hemoglobin at 9.5.  BNP 244 pg/mL.  CMP with BUN 21 and creatinine 1.80 mg/dL.  Portable 1 view chest radiograph without infiltrate.  Patient received midodrine  and albumin  and was considered for admission to the  hospital for further evaluation and treatment.    Hospital Course:   Following conditions were addressed during hospitalization as listed below,  Hypotension. Received albumin  and midodrine .  Improving blood pressure. Emphasized compliance on medications diet and fluids.     Status post abdominal paracentesis for abdominal ascites from decompensated cirrhosis. History of recurrent abdominal ascites.  Patient noncompliant to medication and diet.  Had undergone paracentesis with removal of 6 L of fluid on 12/03/2023.  Will need to continue Lasix  and Aldactone  on discharge.  IR team was consulted and Dermabond was applied  and at this time her leakage has subsided.  Patient wishes to go home at this time.  Patient was emphasized the need for being on all the diuretics and medication.  Refill for all the prescriptions were given and sent to the [facility] any pharmacy.        Stage 3a chronic kidney disease Continue to monitor renal function as outpatient.  Creatinine today at 1.9 from 1.8.     Alcohol abuse In remission according to the patient.  Emphasized compliance with quitting   Chronic diastolic congestive heart failure. Resume diuretic on discharge.     Malnutrition  secondary to cirrhosis of liver.     Chronic anemia, iron deficiency anemia Monitor hematocrit hemoglobin.  Hemoglobin today of 9.2 from 9.5.     Thrombocytopenia  Secondary to alcoholic liver disease.  Will continue to monitor.  No evidence of bleeding.     Chronic atrial fibrillation On metoprolol  at home..  Not on anticoagulation.   Disposition.  At this time, patient is stable for disposition home with outpatient PCP follow-up.  Medical Consultants:  Interventional radiology  Procedures:    Dermabond application Subjective:   Today, patient was seen and examined at bedside.  Dermabond was applied without further leakage.  Patient wishes to go home  Discharge Exam:   Vitals:   12/05/23  0635 12/05/23 1402  BP: 99/80 102/72  Pulse: 85 (!) 54  Resp: 20 16  Temp: 98.5 F (36.9 C) 98.1 F (36.7 C)  SpO2: 96% 98%   Vitals:   12/05/23 0305 12/05/23 0311 12/05/23 0635 12/05/23 1402  BP: 92/62  99/80 102/72  Pulse:  95 85 (!) 54  Resp: (!) 22  20 16   Temp: 97.9 F (36.6 C)  98.5 F (36.9 C) 98.1 F (36.7 C)  TempSrc: Oral  Oral Oral  SpO2: 96%  96% 98%  Weight:      Height:        General: Alert awake, not in obvious distress, appears to be chronically ill HENT: pupils equally reacting to light,  No scleral pallor or icterus noted. Oral mucosa is moist.  Chest: Decreased breath sounds bilaterally. CVS: S1 &S2 heard. No murmur.  Regular rate and rhythm. Abdomen: Soft, nontender abdomen with ascites, Dermabond application without any leakage.  Bowel sounds are heard.   Extremities: No cyanosis, clubbing with trace lower extremity edema peripheral pulses are palpable. Psych: Alert, awake and oriented, normal mood CNS:  No cranial nerve deficits.  Power equal in all extremities.   Skin: Warm and dry.  No rashes noted.  The results of significant diagnostics from this hospitalization (including imaging, microbiology, ancillary and laboratory) are listed below for reference.     Diagnostic Studies:   DG Chest Portable 1 View Result Date: 12/04/2023 CLINICAL DATA:  Dyspnea. EXAM: PORTABLE CHEST 1 VIEW COMPARISON:  12/02/2023. FINDINGS: Stable cardiomegaly with similar enlargement of the pulmonary arteries at the hilum, as can be seen with pulmonary hypertension. Aortic atherosclerosis. No focal consolidation, pleural effusion, or pneumothorax. No acute osseous abnormality. IMPRESSION: 1. No significant change from the prior exam. 2. Stable cardiomegaly. Electronically Signed   By: Harrietta Sherry M.D.   On: 12/04/2023 10:27     Labs:   Basic Metabolic Panel: Recent Labs  Lab 12/02/23 1527 12/03/23 0441 12/04/23 0905 12/05/23 0525  NA 137 142 138 138  K 4.3 4.6  4.6 5.0  CL 104 106 106 105  CO2 26 24 24 26   GLUCOSE 82 89 72 98  BUN 15 17 21  25*  CREATININE 1.45* 1.58* 1.80* 1.95*  CALCIUM  8.7* 8.8* 8.3* 8.8*   GFR Estimated Creatinine Clearance: 41.2 mL/min (A) (by C-G formula based on SCr of 1.95 mg/dL (H)). Liver Function Tests: Recent Labs  Lab 12/02/23 1527 12/04/23 0905 12/05/23 0525  AST 21 18 17   ALT 10 9 8   ALKPHOS 72 64 58  BILITOT 1.1 0.7 0.9  PROT 7.4 6.2* 6.5  ALBUMIN  3.0* 2.5* 3.1*   Recent Labs  Lab 12/02/23 1527  LIPASE 35   Recent Labs  Lab 12/02/23 1611  AMMONIA 20   Coagulation profile Recent Labs  Lab 12/02/23 1527  INR 1.3*    CBC: Recent Labs  Lab 12/02/23 1527 12/03/23 0441 12/04/23 0905 12/05/23 0525  WBC 4.6 4.5 4.8 4.3  NEUTROABS  --   --  3.4  --   HGB 9.8* 9.4* 9.5* 9.2*  HCT 32.7* 31.0* 31.5* 30.6*  MCV 81.1 80.3 81.6 81.0  PLT 120* 118* 109* 109*   Cardiac Enzymes: No results for input(s): CKTOTAL, CKMB, CKMBINDEX, TROPONINI in  the last 168 hours. BNP: Invalid input(s): POCBNP CBG: Recent Labs  Lab 12/04/23 2056  GLUCAP 130*   D-Dimer No results for input(s): DDIMER in the last 72 hours. Hgb A1c No results for input(s): HGBA1C in the last 72 hours. Lipid Profile No results for input(s): CHOL, HDL, LDLCALC, TRIG, CHOLHDL, LDLDIRECT in the last 72 hours. Thyroid function studies No results for input(s): TSH, T4TOTAL, T3FREE, THYROIDAB in the last 72 hours.  Invalid input(s): FREET3 Anemia work up No results for input(s): VITAMINB12, FOLATE, FERRITIN, TIBC, IRON, RETICCTPCT in the last 72 hours. Microbiology No results found for this or any previous visit (from the past 240 hours).   Discharge Instructions:   Discharge Instructions     Call MD for:  persistant nausea and vomiting   Complete by: As directed    Call MD for:  severe uncontrolled pain   Complete by: As directed    Call MD for:  temperature >100.4    Complete by: As directed    Diet - low sodium heart healthy   Complete by: As directed    Fluid restriction 1244ml/day   Discharge instructions   Complete by: As directed    Follow up with primary care provider in one week. Check blood work at that time. Seek medical attention for worsening symptoms. Please take all the medications as prescribed.   Increase activity slowly   Complete by: As directed       Allergies as of 12/05/2023       Reactions   Penicillins Other (See Comments)   Convulsions, Patient could not walk, childhood allergy         Medication List     TAKE these medications    acetaminophen  325 MG tablet Commonly known as: TYLENOL  Take 2 tablets (650 mg total) by mouth every 8 (eight) hours as needed for mild pain (pain score 1-3) or moderate pain (pain score 4-6). Do not exceed 6 tabs per day.   albuterol  108 (90 Base) MCG/ACT inhaler Commonly known as: ProAir  HFA Inhale 2 puffs into the lungs every 6 (six) hours as needed for wheezing or shortness of breath.   allopurinol  100 MG tablet Commonly known as: ZYLOPRIM  Take 0.5 tablets (50 mg total) by mouth daily.   atorvastatin  40 MG tablet Commonly known as: LIPITOR Take 1 tablet (40 mg total) by mouth daily.   brimonidine  0.15 % ophthalmic solution Commonly known as: ALPHAGAN  Place 1 drop into both eyes 2 (two) times daily.   FeroSul 325 (65 Fe) MG tablet Generic drug: ferrous sulfate  Take 1 tablet (325 mg total) by mouth every Monday, Wednesday, and Friday.   fluticasone  220 MCG/ACT inhaler Commonly known as: FLOVENT  HFA Inhale 2 puffs into the lungs in the morning and at bedtime.   folic acid  1 MG tablet Commonly known as: FOLVITE  Take 1 tablet (1 mg total) by mouth daily.   furosemide  20 MG tablet Commonly known as: LASIX  Take 2 tablets (40 mg total) by mouth daily.   hydroxypropyl methylcellulose / hypromellose 2.5 % ophthalmic solution Commonly known as: ISOPTO TEARS / GONIOVISC Place  1 drop into the right eye as needed for dry eyes.   ketoconazole  2 % shampoo Commonly known as: NIZORAL  Apply 1 Application topically See admin instructions. Shampoo the scalp 2 times a week   melatonin 5 MG Tabs Take 1 tablet (5 mg total) by mouth at bedtime.   metoprolol  succinate 25 MG 24 hr tablet Commonly known as: Toprol  XL Take 0.5  tablets (12.5 mg total) by mouth daily.   midodrine  10 MG tablet Commonly known as: PROAMATINE  Take 1 tablet (10 mg total) by mouth 3 (three) times daily with meals.   omeprazole  40 MG capsule Commonly known as: PRILOSEC Take 1 capsule (40 mg total) by mouth daily before breakfast.   Salonpas  Pain Relief  Patch Ptch Apply 1 patch topically daily as needed (for mid-back pain and lower back pain).   spironolactone  50 MG tablet Commonly known as: ALDACTONE  Take 0.5 tablets (25 mg total) by mouth daily.        Follow-up Information     Reed, Tiffany L, DO Follow up in 1 week(s).   Specialty: Geriatric Medicine Contact information: [address] [place] [address] [phone]                  Time coordinating discharge: 39 minutes  Signed:  Ovadia Lopp  [facility] 12/05/2023, 5:33 PM

## 2023-12-05 NOTE — Progress Notes (Signed)
 PROGRESS NOTE  TAIDEN RAYBOURN FMW:[id number] DOB: 06-18-1955 DOA: 12/04/2023 PCP: Cloria Annabella CROME, DO   LOS: 0 days   Brief narrative:  John [name] is a 68 y.o. male with medical history significant of alcoholic cirrhosis, EtOH abuse in remission, asthma, nonobstructive CAD, chronic atrial fibrillation not on anticoagulation, chronic diastolic heart failure, stage III CKD, GERD, gout, history of treated hep C, hypertension,  PAD, polysubstance abuse, primary hypertension, renal cell carcinoma, seizures, sickle cell trait, history of CVA presented to the hospital with persistent leakage from the [facility] centesis puncture site.  Of note patient had undergone paracentesis paracentesis 1 day prior to this presentation and had been discharged from the hospital.  In the ED patient was hypotensive and slightly tachycardic.  Labs were remarkable for platelet low at 109 and hemoglobin at 9.5.  BNP 244 pg/mL.  CMP with BUN 21 and creatinine 1.80 mg/dL.  Portable 1 view chest radiograph without infiltrate.  Patient received midodrine  and albumin  and was considered for admission to the hospital for further evaluation and treatment.     Assessment/Plan: Principal Problem:   Hypotension (arterial) Active Problems:   Stage 3b chronic kidney disease (HCC)   Thrombocytopenia (HCC)   Alcohol abuse   (HFpEF) heart failure with preserved ejection fraction (HCC)   Malnutrition of moderate degree   Decompensated cirrhosis (HCC)   Chronic anemia   Abdominal ascites   Status post abdominal paracentesis   Chronic atrial fibrillation (HCC)    Hypotension. Received albumin  and midodrine .  Improving blood pressure.     Status post abdominal paracentesis for abdominal ascites from decompensated cirrhosis. History of recurrent abdominal ascites.  Patient noncompliant to medication and diet.  Had undergone paracentesis with removal of 6 L of fluid on 12/03/2023.  Will need to continue Lasix  and Aldactone  on  discharge.  IR team was consulted and Dermabond has been tried at the paracentesis site.  Patient to lie on the contralateral side.Given significant ascites, may need another paracentesis.     Stage 3a chronic kidney disease Continue to monitor renal function.  Creatinine today at 1.9 from 1.8.     Alcohol abuse In remission according to the patient.   Chronic diastolic congestive heart failure. Beta-blockers and diuretic on hold due to hypotension.     Malnutrition  secondary to cirrhosis of liver. Nutrition services to be consulted.     Chronic anemia, iron deficiency anemia Monitor hematocrit hemoglobin.  Hemoglobin today of 9.2 from 9.5.     Thrombocytopenia  Secondary to alcoholic liver disease.  Will continue to monitor.  No evidence of bleeding.     Chronic atrial fibrillation Metoprolol  currently on hold due to hypotension.  Not on anticoagulation. DVT prophylaxis: SCDs Start: 12/04/23 1250   Disposition: Home with home health  Status is: Observation The patient will require care spanning > 2 midnights and should be moved to inpatient because: Gross ascites, leakage around the ascitic tap site, hypotension, need for further observation    Code Status:     Code Status: Full Code  Family Communication: None at bedside  Consultants: Intervention radiology  Procedures: None  Anti-infectives:  None  Anti-infectives (From admission, onward)    None        Subjective: Today, patient was seen and examined at bedside.  Complains of leakage around the paracentesis site.  Also some shortness of breath distention and leg edema.  Denies any nausea vomiting fever chills or rigors.  Objective: Vitals:   12/05/23 0311 12/05/23  0635  BP:  99/80  Pulse: 95 85  Resp:  20  Temp:  98.5 F (36.9 C)  SpO2:  96%    Intake/Output Summary (Last 24 hours) at 12/05/2023 1400 Last data filed at 12/05/2023 0600 Gross per 24 hour  Intake 846.03 ml  Output 200 ml  Net  646.03 ml   Filed Weights   12/04/23 0810  Weight: 95.3 kg   Body mass index is 31.93 kg/m.   Physical Exam:  GENERAL: Patient is alert awake and oriented. Not in obvious distress.  Appears chronically ill, HENT: No scleral pallor or icterus. Pupils equally reactive to light. Oral mucosa is moist NECK: is supple, no gross swelling noted. CHEST: Clear to auscultation. No crackles or wheezes.  Diminished breath sounds bilaterally. CVS: S1 and S2 heard, no murmur. Regular rate and rhythm.  ABDOMEN: Soft, non-tender tented abdomen with ascites, recent ascitic tapping with dressing. EXTREMITIES: Bilateral lower extremity edema-trace CNS: Cranial nerves are intact. No focal motor deficits. SKIN: warm and dry without rashes.  Data Review: I have personally reviewed the following laboratory data and studies,  CBC: Recent Labs  Lab 12/02/23 1527 12/03/23 0441 12/04/23 0905 12/05/23 0525  WBC 4.6 4.5 4.8 4.3  NEUTROABS  --   --  3.4  --   HGB 9.8* 9.4* 9.5* 9.2*  HCT 32.7* 31.0* 31.5* 30.6*  MCV 81.1 80.3 81.6 81.0  PLT 120* 118* 109* 109*   Basic Metabolic Panel: Recent Labs  Lab 12/02/23 1527 12/03/23 0441 12/04/23 0905 12/05/23 0525  NA 137 142 138 138  K 4.3 4.6 4.6 5.0  CL 104 106 106 105  CO2 26 24 24 26   GLUCOSE 82 89 72 98  BUN 15 17 21  25*  CREATININE 1.45* 1.58* 1.80* 1.95*  CALCIUM  8.7* 8.8* 8.3* 8.8*   Liver Function Tests: Recent Labs  Lab 12/02/23 1527 12/04/23 0905 12/05/23 0525  AST 21 18 17   ALT 10 9 8   ALKPHOS 72 64 58  BILITOT 1.1 0.7 0.9  PROT 7.4 6.2* 6.5  ALBUMIN  3.0* 2.5* 3.1*   Recent Labs  Lab 12/02/23 1527  LIPASE 35   Recent Labs  Lab 12/02/23 1611  AMMONIA 20   Cardiac Enzymes: No results for input(s): CKTOTAL, CKMB, CKMBINDEX, TROPONINI in the last 168 hours. BNP (last 3 results) Recent Labs    10/20/23 1605 11/12/23 1304 12/04/23 0905  BNP 320.2* 256.6* 243.7*    ProBNP (last 3 results) Recent Labs     09/04/23 1048  PROBNP 2,814.0*    CBG: Recent Labs  Lab 12/04/23 2056  GLUCAP 130*   No results found for this or any previous visit (from the past 240 hours).   Studies: DG Chest Portable 1 View Result Date: 12/04/2023 CLINICAL DATA:  Dyspnea. EXAM: PORTABLE CHEST 1 VIEW COMPARISON:  12/02/2023. FINDINGS: Stable cardiomegaly with similar enlargement of the pulmonary arteries at the hilum, as can be seen with pulmonary hypertension. Aortic atherosclerosis. No focal consolidation, pleural effusion, or pneumothorax. No acute osseous abnormality. IMPRESSION: 1. No significant change from the prior exam. 2. Stable cardiomegaly. Electronically Signed   By: Harrietta Sherry M.D.   On: 12/04/2023 10:27      Vernal Alstrom, MD  [facility] 12/05/2023  If 7PM-7AM, please contact night-coverage

## 2023-12-05 NOTE — Progress Notes (Signed)
 Patient ID: John [name], male   DOB: 1955-08-10, 68 y.o.   MRN: [id number] Dermabond placed onto previous left lat abd puncture site (recent paracentesis) secondary to leakage of ascites; rec that pt remain in rt lat decubitus position for next 15-20 minutes to allow site to dry then place in supine position and recheck site. If leaking persists pt may require additional paracentesis as long as BP not soft. Nurse aware.

## 2023-12-05 NOTE — Progress Notes (Signed)
 Blood pressure was down to 85/58. HR 123. Pt Awake, alert, and oriented. No complaints of dizziness. On call A. Andrez, NP informed with new orders. JEARLDINE Guillaume, RN made aware. Latest BP= 92/62. Will continue to monitor.

## 2023-12-05 NOTE — Progress Notes (Addendum)
     Patient Name: John [name]           DOB: 03/16/1956  MRN: [id number]      Admission Date: 12/04/2023  Attending Provider: Celinda Alm Lot, MD  Primary Diagnosis: Hypotension (arterial)   Level of care: Telemetry   OVERNIGHT PROGRESS REPORT  Notified by RN regarding BPs 80/60.   Asymptomatic, alert/oriented.  Making urine.  Remains afebrile.   Beta-blocker and diuretics on hold due to hypotension.  8/6- Underwent paracentesis for recurrent abdominal ascites.  6 L of hazy, yellow fluid removed. Patient was discharged.   8/7- Returning to hospital for chief complaint of abdominal pain and persistent leakage from paracentesis puncture site.  On admission, noted to be hypotensive (SBP 80's), afebrile.   WBC normal, lactic acid normal.  Hemoglobin 9.5. Hypotension was treated with 10 mg midodrine  and IV albumin  x 1 earlier on admission.   Plan:  Administer an additional 10 mg midodrine  and IV albumin .    Addendum:  BP 90/72  Lavanda Horns, DNP, ACNPC- AG [facility] [place]

## 2023-12-08 ENCOUNTER — Other Ambulatory Visit (HOSPITAL_COMMUNITY): Payer: Self-pay

## 2023-12-10 ENCOUNTER — Other Ambulatory Visit: Payer: Self-pay

## 2023-12-10 ENCOUNTER — Observation Stay (HOSPITAL_COMMUNITY)
Admission: EM | Admit: 2023-12-10 | Discharge: 2023-12-11 | Disposition: A | Discharge status: Home / Self Care | Attending: Emergency Medicine | Admitting: Emergency Medicine

## 2023-12-10 ENCOUNTER — Encounter (HOSPITAL_COMMUNITY): Payer: Self-pay | Admitting: Internal Medicine

## 2023-12-10 ENCOUNTER — Emergency Department (HOSPITAL_COMMUNITY)

## 2023-12-10 DIAGNOSIS — R0602 Shortness of breath: Secondary | ICD-10-CM

## 2023-12-10 DIAGNOSIS — N1832 Chronic kidney disease, stage 3b: Secondary | ICD-10-CM | POA: Diagnosis not present

## 2023-12-10 DIAGNOSIS — I8500 Esophageal varices without bleeding: Secondary | ICD-10-CM | POA: Diagnosis not present

## 2023-12-10 DIAGNOSIS — M109 Gout, unspecified: Secondary | ICD-10-CM | POA: Insufficient documentation

## 2023-12-10 DIAGNOSIS — D696 Thrombocytopenia, unspecified: Secondary | ICD-10-CM | POA: Diagnosis not present

## 2023-12-10 DIAGNOSIS — E785 Hyperlipidemia, unspecified: Secondary | ICD-10-CM | POA: Diagnosis present

## 2023-12-10 DIAGNOSIS — I4891 Unspecified atrial fibrillation: Principal | ICD-10-CM

## 2023-12-10 DIAGNOSIS — Z8673 Personal history of transient ischemic attack (TIA), and cerebral infarction without residual deficits: Secondary | ICD-10-CM | POA: Insufficient documentation

## 2023-12-10 DIAGNOSIS — D649 Anemia, unspecified: Secondary | ICD-10-CM | POA: Insufficient documentation

## 2023-12-10 DIAGNOSIS — I130 Hypertensive heart and chronic kidney disease with heart failure and stage 1 through stage 4 chronic kidney disease, or unspecified chronic kidney disease: Secondary | ICD-10-CM | POA: Diagnosis not present

## 2023-12-10 DIAGNOSIS — D638 Anemia in other chronic diseases classified elsewhere: Secondary | ICD-10-CM | POA: Diagnosis present

## 2023-12-10 DIAGNOSIS — K7031 Alcoholic cirrhosis of liver with ascites: Principal | ICD-10-CM | POA: Diagnosis present

## 2023-12-10 DIAGNOSIS — I5032 Chronic diastolic (congestive) heart failure: Secondary | ICD-10-CM | POA: Diagnosis present

## 2023-12-10 DIAGNOSIS — I4819 Other persistent atrial fibrillation: Secondary | ICD-10-CM | POA: Diagnosis not present

## 2023-12-10 DIAGNOSIS — I10 Essential (primary) hypertension: Secondary | ICD-10-CM | POA: Diagnosis present

## 2023-12-10 DIAGNOSIS — I4892 Unspecified atrial flutter: Secondary | ICD-10-CM | POA: Diagnosis present

## 2023-12-10 LAB — CBC WITH DIFFERENTIAL/PLATELET
Abs Immature Granulocytes: 0.01 K/uL (ref 0.00–0.07)
Basophils Absolute: 0 K/uL (ref 0.0–0.1)
Basophils Relative: 1 %
Eosinophils Absolute: 0.1 K/uL (ref 0.0–0.5)
Eosinophils Relative: 2 %
HCT: 33 % — ABNORMAL LOW (ref 39.0–52.0)
Hemoglobin: 9.9 g/dL — ABNORMAL LOW (ref 13.0–17.0)
Immature Granulocytes: 0 %
Lymphocytes Relative: 12 %
Lymphs Abs: 0.6 K/uL — ABNORMAL LOW (ref 0.7–4.0)
MCH: 24.1 pg — ABNORMAL LOW (ref 26.0–34.0)
MCHC: 30 g/dL (ref 30.0–36.0)
MCV: 80.5 fL (ref 80.0–100.0)
Monocytes Absolute: 0.6 K/uL (ref 0.1–1.0)
Monocytes Relative: 12 %
Neutro Abs: 3.5 K/uL (ref 1.7–7.7)
Neutrophils Relative %: 73 %
Platelets: 116 K/uL — ABNORMAL LOW (ref 150–400)
RBC: 4.1 MIL/uL — ABNORMAL LOW (ref 4.22–5.81)
RDW: 17.7 % — ABNORMAL HIGH (ref 11.5–15.5)
WBC: 4.7 K/uL (ref 4.0–10.5)
nRBC: 0 % (ref 0.0–0.2)

## 2023-12-10 LAB — COMPREHENSIVE METABOLIC PANEL WITH GFR
ALT: 12 U/L (ref 0–44)
AST: 22 U/L (ref 15–41)
Albumin: 3.1 g/dL — ABNORMAL LOW (ref 3.5–5.0)
Alkaline Phosphatase: 58 U/L (ref 38–126)
Anion gap: 11 (ref 5–15)
BUN: 22 mg/dL (ref 8–23)
CO2: 21 mmol/L — ABNORMAL LOW (ref 22–32)
Calcium: 8.6 mg/dL — ABNORMAL LOW (ref 8.9–10.3)
Chloride: 105 mmol/L (ref 98–111)
Creatinine, Ser: 1.85 mg/dL — ABNORMAL HIGH (ref 0.61–1.24)
GFR, Estimated: 39 mL/min — ABNORMAL LOW (ref >60)
Glucose, Bld: 78 mg/dL (ref 70–99)
Potassium: 4.3 mmol/L (ref 3.5–5.1)
Sodium: 137 mmol/L (ref 135–145)
Total Bilirubin: 1 mg/dL (ref 0.0–1.2)
Total Protein: 6.9 g/dL (ref 6.5–8.1)

## 2023-12-10 LAB — BRAIN NATRIURETIC PEPTIDE: B Natriuretic Peptide: 228 pg/mL — ABNORMAL HIGH (ref 0.0–100.0)

## 2023-12-10 LAB — MAGNESIUM: Magnesium: 2.1 mg/dL (ref 1.7–2.4)

## 2023-12-10 LAB — LIPASE, BLOOD: Lipase: 34 U/L (ref 11–51)

## 2023-12-10 MED ORDER — ATORVASTATIN CALCIUM 40 MG PO TABS
40.0000 mg | ORAL_TABLET | Freq: Every day | ORAL | Status: DC
  Administered 2023-12-11: 40 mg via ORAL
  Filled 2023-12-10: qty 1

## 2023-12-10 MED ORDER — LABETALOL HCL 5 MG/ML IV SOLN
5.0000 mg | Freq: Once | INTRAVENOUS | Status: AC
  Administered 2023-12-10 (×2): 5 mg via INTRAVENOUS
  Filled 2023-12-10: qty 4

## 2023-12-10 MED ORDER — FUROSEMIDE 10 MG/ML IJ SOLN
20.0000 mg | Freq: Once | INTRAMUSCULAR | Status: AC
  Administered 2023-12-10 (×2): 20 mg via INTRAVENOUS
  Filled 2023-12-10: qty 4

## 2023-12-10 MED ORDER — METOPROLOL SUCCINATE ER 25 MG PO TB24
12.5000 mg | ORAL_TABLET | Freq: Every day | ORAL | Status: DC
  Administered 2023-12-10 (×2): 12.5 mg via ORAL
  Filled 2023-12-10: qty 1

## 2023-12-10 MED ORDER — ONDANSETRON HCL 4 MG/2ML IJ SOLN: 4.0000 mg | Freq: Four times a day (QID) | INTRAMUSCULAR | Status: DC | PRN

## 2023-12-10 MED ORDER — FENTANYL CITRATE PF 50 MCG/ML IJ SOSY
50.0000 ug | PREFILLED_SYRINGE | Freq: Once | INTRAMUSCULAR | Status: AC
  Administered 2023-12-10 (×2): 50 ug via INTRAVENOUS
  Filled 2023-12-10: qty 1

## 2023-12-10 MED ORDER — ALLOPURINOL 100 MG PO TABS
50.0000 mg | ORAL_TABLET | Freq: Every day | ORAL | Status: DC
  Administered 2023-12-11: 50 mg via ORAL
  Filled 2023-12-10: qty 1

## 2023-12-10 MED ORDER — PNEUMOCOCCAL 20-VAL CONJ VACC 0.5 ML IM SUSY: 0.5000 mL | PREFILLED_SYRINGE | INTRAMUSCULAR | Status: DC | PRN

## 2023-12-10 MED ORDER — SODIUM CHLORIDE 0.9% FLUSH
3.0000 mL | Freq: Two times a day (BID) | INTRAVENOUS | Status: DC
  Administered 2023-12-10 – 2023-12-11 (×3): 3 mL via INTRAVENOUS

## 2023-12-10 MED ORDER — MIDODRINE HCL 5 MG PO TABS
10.0000 mg | ORAL_TABLET | Freq: Three times a day (TID) | ORAL | Status: DC
  Administered 2023-12-11 (×2): 10 mg via ORAL
  Filled 2023-12-10 (×2): qty 2

## 2023-12-10 MED ORDER — FUROSEMIDE 40 MG PO TABS
40.0000 mg | ORAL_TABLET | Freq: Every day | ORAL | Status: DC
  Administered 2023-12-11: 40 mg via ORAL
  Filled 2023-12-10: qty 1

## 2023-12-10 MED ORDER — ONDANSETRON HCL 4 MG PO TABS: 4.0000 mg | ORAL_TABLET | Freq: Four times a day (QID) | ORAL | Status: DC | PRN

## 2023-12-10 MED ORDER — ACETAMINOPHEN 650 MG RE SUPP: 650.0000 mg | Freq: Four times a day (QID) | RECTAL | Status: DC | PRN

## 2023-12-10 MED ORDER — ALBUTEROL SULFATE (2.5 MG/3ML) 0.083% IN NEBU: 2.5000 mg | INHALATION_SOLUTION | Freq: Four times a day (QID) | RESPIRATORY_TRACT | Status: DC | PRN

## 2023-12-10 MED ORDER — HEPARIN SODIUM (PORCINE) 5000 UNIT/ML IJ SOLN
5000.0000 [IU] | Freq: Three times a day (TID) | INTRAMUSCULAR | Status: DC
  Administered 2023-12-10 – 2023-12-11 (×4): 5000 [IU] via SUBCUTANEOUS
  Filled 2023-12-10 (×3): qty 1

## 2023-12-10 MED ORDER — LACTULOSE 10 GM/15ML PO SOLN: 10.0000 g | Freq: Two times a day (BID) | ORAL | Status: DC | PRN

## 2023-12-10 MED ORDER — ACETAMINOPHEN 325 MG PO TABS: 650.0000 mg | ORAL_TABLET | Freq: Four times a day (QID) | ORAL | Status: DC | PRN

## 2023-12-10 MED ORDER — FOLIC ACID 1 MG PO TABS
1.0000 mg | ORAL_TABLET | Freq: Every day | ORAL | Status: DC
  Administered 2023-12-11: 1 mg via ORAL
  Filled 2023-12-10: qty 1

## 2023-12-10 MED ORDER — MELATONIN 5 MG PO TABS: 5.0000 mg | ORAL_TABLET | Freq: Every evening | ORAL | Status: DC | PRN

## 2023-12-10 MED ORDER — SPIRONOLACTONE 25 MG PO TABS
25.0000 mg | ORAL_TABLET | Freq: Every day | ORAL | Status: DC
  Administered 2023-12-11: 25 mg via ORAL
  Filled 2023-12-10: qty 1

## 2023-12-10 NOTE — Hospital Course (Signed)
 John [name] is a 68 y.o. 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

## 2023-12-10 NOTE — ED Notes (Signed)
 Pt has been given an extra tray
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
## 2023-12-10 NOTE — Group Note (Deleted)
 Date:  12/10/2023 Time:  2:22 PM  Group Topic/Focus:  Wellness Toolbox:   The focus of this group is to discuss various aspects of wellness, balancing those aspects and exploring ways to increase the ability to experience wellness.  Patients will create a wellness toolbox for use upon discharge.     Participation Level:  {BHH PARTICIPATION OZCZO:77735}  Participation Quality:  {BHH PARTICIPATION QUALITY:22265}  Affect:  {BHH AFFECT:22266}  Cognitive:  {BHH COGNITIVE:22267}  Insight: {BHH Insight2:20797}  Engagement in Group:  {BHH ENGAGEMENT IN HMNLE:77731}  Modes of Intervention:  {BHH MODES OF INTERVENTION:22269}  Additional Comments:  ***  [name] 12/10/2023, 2:22 PM

## 2023-12-10 NOTE — ED Notes (Signed)
 Lab called and Mg added on

## 2023-12-10 NOTE — ED Notes (Signed)
 Multiple IVs attempted.SABRASABRAIV consult placed

## 2023-12-10 NOTE — H&P (Signed)
 History and Physical    John [name] FMW:[id number] DOB: 1955-12-22 DOA: 12/10/2023  PCP: Cloria Annabella CROME, DO  Patient coming from: Home  I have personally briefly reviewed patient's old medical records in [facility]  Chief Complaint: Shortness of breath, abdominal swelling  HPI: John [name] is a 68 y.o. male with medical history significant for alcoholic cirrhosis with portal hypertension and recurrent ascites, persistent atrial fibrillation not on AC, chronic HFpEF, CKD stage IIIb, HTN, HLD, iron deficiency anemia, chronic thrombocytopenia, RCC, alcohol abuse in remission who presented to the ED for evaluation of shortness of breath, abdominal and lower extremity swelling.  Patient frequently admitted due to complications related to cirrhosis.  Most recent paracentesis 8/6 with removal of 6 L fluid.  Readmitted 8/7-8/8 for hypotension and leakage from paracentesis puncture site.  Received albumin  and midodrine  with improvement in blood pressure.  Dermabond applied to puncture site with resolution of leakage.  He was given refill for all prescriptions.  Patient states that he has not had his medications since discharge.  He has been having increasing abdominal distention and bilateral lower extremity swelling.  He says the abdominal distention has made it hard for him to breathe when lying flat or ambulating.  He has had nonproductive cough.  He denies fevers, chills, diaphoresis, chest pain.  He reports good urine output and regular bowel movements.  Patient states that he has not had any alcohol for 20 years.  ED Course  Labs/Imaging on admission: I have personally reviewed following labs and imaging studies.  Initial vitals showed BP 111/80, pulse 95, RR 18, temp 98.2 F, SpO2 96% on room air.  Labs showed WBC 4.7, hemoglobin 9.9, platelets 116, magnesium  2.1, BNP 128, lipase 24, sodium 137, potassium 4.3, bicarb 21, BUN 22, creatinine 1.85, serum glucose 78, LFTs  within normal limits.  Portable chest x-ray showed stable cardiomegaly and pulmonary vascular congestion.  CT abdomen/pelvis without contrast showed cirrhosis with sequela of portal hypertension including splenomegaly, moderate volume abdominal pelvic ascites and diffuse bowel wall edema.  Small bilateral pleural effusions.  Patient was given IV Lasix  20 mg x 2, IV labetalol  5 mg.  The hospitalist service was consulted to admit.  Review of Systems: All systems reviewed and are negative except as documented in history of present illness above.   Past Medical History:  Diagnosis Date   Allergy    Arthritis    oa back   Asthma    CAP (community acquired pneumonia) 05/23/2015   Cataract    both eyes   CHF (congestive heart failure) (HCC) 2001   sees primary for   Chronic back pain    Chronic kidney disease    ckd 3   Cirrhosis (HCC)    alcoholic with h/o varices, ascites   Coronary artery disease    nonobstrucrtibe   ETOH abuse    GERD (gastroesophageal reflux disease)    Gout    Hepatitis    hepatitic c 2018 24 week tx with ecuplipsa, no hepatitic c detected after tx   History of blood transfusion 8-9- yrs ago   Hypertension    Optic neuropathy, left    PAD (peripheral artery disease) (HCC)    slight  primary manages   Polysubstance abuse (HCC)    Primary hypertension    Renal cell carcinoma (HCC) 2016, 2018 and 2022   left ablation   Seizures [facility])    age 41 none since   Sickle cell trait (HCC)  Stroke [facility]) 1998   light no problems with   Uses walker 12/04/2020   Wears dentures    Wears glasses     Past Surgical History:  Procedure Laterality Date   ARTHRODESIS METATARSALPHALANGEAL JOINT (MTPJ) Right 12/07/2020   Procedure: ARTHRODESIS METATARSALPHALANGEAL JOINT (MTPJ);  Surgeon: Janit Thresa HERO, DPM;  Location: [facility];  Service: Podiatry;  Laterality: Right;   COLONOSCOPY  2021   ESOPHAGOGASTRODUODENOSCOPY N/A 08/14/2014   Procedure:  ESOPHAGOGASTRODUODENOSCOPY (EGD);  Surgeon: Gwendlyn ONEIDA Buddy, MD;  Location: [facility];  Service: Endoscopy;  Laterality: N/A;   ESOPHAGOGASTRODUODENOSCOPY (EGD) WITH PROPOFOL  N/A 06/03/2017   Procedure: ESOPHAGOGASTRODUODENOSCOPY (EGD) WITH PROPOFOL ;  Surgeon: Buddy Gwendlyn ONEIDA, MD;  Location: [facility];  Service: Endoscopy;  Laterality: N/A;   HALLUX FUSION Left 07/28/2020   Procedure: HALLUX FUSION MPJ;  Surgeon: Janit Thresa HERO, DPM;  Location: [facility];  Service: Podiatry;  Laterality: Left;   HAMMER TOE SURGERY Left 07/28/2020   Procedure: HAMMER TOE CORRECTION  2,3,AND4 LEFT FOOT;  Surgeon: Janit Thresa HERO, DPM;  Location: [facility];  Service: Podiatry;  Laterality: Left;   HAMMER TOE SURGERY Right 12/07/2020   Procedure: HAMMER TOE CORRECTION  2-4;  Surgeon: Janit Thresa HERO, DPM;  Location: [facility];  Service: Podiatry;  Laterality: Right;   IR PARACENTESIS  09/05/2023   IR PARACENTESIS  09/16/2023   IR RADIOLOGIST EVAL & MGMT  09/25/2016   IR RADIOLOGIST EVAL & MGMT  12/10/2016   IR RADIOLOGIST EVAL & MGMT  03/25/2018   IR RADIOLOGIST EVAL & MGMT  03/24/2019   IR RADIOLOGIST EVAL & MGMT  05/17/2020   IR RADIOLOGIST EVAL & MGMT  08/03/2020   IR RADIOLOGIST EVAL & MGMT  11/22/2020   IR RADIOLOGIST EVAL & MGMT  05/31/2021   METATARSAL HEAD EXCISION Left 07/28/2020   Procedure: METATARSAL HEAD EXCISION TOES 2,3,AND 4  LEFT FOOT;  Surgeon: Janit Thresa HERO, DPM;  Location: [facility];  Service: Podiatry;  Laterality: Left;   METATARSAL OSTEOTOMY Right 12/07/2020   Procedure: METATARSAL OSTEOTOMY TOES 2-4 RIGHT FOOT;  Surgeon: Janit Thresa HERO, DPM;  Location: [facility];  Service: Podiatry;  Laterality: Right;   none     RADIOFREQUENCY ABLATION Left 11/08/2016   Procedure: LEFT RENAL CRYOABLATION;  Surgeon: Vanice Sharper, MD;  Location: [facility];  Service: Anesthesiology;  Laterality: Left;   RADIOLOGY WITH  ANESTHESIA N/A 08/15/2014   Procedure: RADIOLOGY WITH ANESTHESIA;  Surgeon: Sharper Vanice, MD;  Location: [facility];  Service: Radiology;  Laterality: N/A;   RADIOLOGY WITH ANESTHESIA Left 07/05/2020   Procedure: CT CRYOABLATION;  Surgeon: Vanice Sharper, MD;  Location: [facility];  Service: Anesthesiology;  Laterality: Left;   UPPER GASTROINTESTINAL ENDOSCOPY      Social History: Patient reports abstinence from alcohol for 20 years.  Allergies  Allergen Reactions   Penicillins Other (See Comments)    Convulsions, Patient could not walk, childhood allergy     Family History  Problem Relation Age of Onset   Hypertension Mother        Living   Kidney disease Mother    Diabetes Mother    Heart disease Mother    Hypertension Father        Deceased, 72   Ulcers Father    Stomach cancer Father    Hypertension Brother    Diabetes Brother    Kidney disease Brother    Hypertension Sister    Colon  cancer Neg Hx    Esophageal cancer Neg Hx    Rectal cancer Neg Hx      Prior to Admission medications   Medication Sig Start Date End Date Taking? Authorizing Provider  acetaminophen  (TYLENOL ) 325 MG tablet Take 2 tablets (650 mg total) by mouth every 8 (eight) hours as needed for mild pain (pain score 1-3) or moderate pain (pain score 4-6). Do not exceed 6 tabs per day. 12/05/23   Pokhrel, Vernal, MD  albuterol  (PROAIR  HFA) 108 (90 Base) MCG/ACT inhaler Inhale 2 puffs into the lungs every 6 (six) hours as needed for wheezing or shortness of breath. 12/05/23   Pokhrel, Vernal, MD  allopurinol  (ZYLOPRIM ) 100 MG tablet Take 0.5 tablets (50 mg total) by mouth daily. 12/05/23 03/04/24  Pokhrel, Vernal, MD  atorvastatin  (LIPITOR) 40 MG tablet Take 1 tablet (40 mg total) by mouth daily. 12/05/23 03/04/24  Pokhrel, Laxman, MD  brimonidine  (ALPHAGAN ) 0.2 % ophthalmic solution Place 1 drop into both eyes 2 (two) times daily. 12/05/23   Sonjia Vernal, MD  FEROSUL 325 (65 Fe) MG tablet Take 1 tablet (325 mg total) by  mouth every Monday, Wednesday, and Friday. 12/05/23 03/04/24  Pokhrel, Vernal, MD  fluticasone  (FLOVENT  HFA) 220 MCG/ACT inhaler Inhale 2 puffs into the lungs in the morning and at bedtime. 12/05/23   Pokhrel, Laxman, MD  folic acid  (FOLVITE ) 1 MG tablet Take 1 tablet (1 mg total) by mouth daily. 12/05/23 03/04/24  Pokhrel, Laxman, MD  furosemide  (LASIX ) 20 MG tablet Take 2 tablets (40 mg total) by mouth daily. 12/05/23 01/04/24  Pokhrel, Laxman, MD  hydroxypropyl methylcellulose / hypromellose (ISOPTO TEARS / GONIOVISC) 2.5 % ophthalmic solution Place 1 drop into the right eye as needed for dry eyes. 12/05/23   Pokhrel, Vernal, MD  ketoconazole  (NIZORAL ) 2 % shampoo Apply 1 Application topically See admin instructions. Shampoo the scalp 2 times a week    [provider]  melatonin 5 MG TABS Take 1 tablet (5 mg total) by mouth at bedtime. 12/05/23 03/04/24  Pokhrel, Vernal, MD  Menthol -Methyl Salicylate  (SALONPAS  PAIN RELIEF  PATCH) PTCH Apply 1 patch topically daily as needed (for mid-back pain and lower back pain).    [provider]  metoprolol  succinate (TOPROL  XL) 25 MG 24 hr tablet Take 0.5 tablets (12.5 mg total) by mouth daily. 12/05/23 03/04/24  Pokhrel, Laxman, MD  midodrine  (PROAMATINE ) 10 MG tablet Take 1 tablet (10 mg total) by mouth 3 (three) times daily with meals. 12/05/23 01/04/24  Pokhrel, Vernal, MD  omeprazole  (PRILOSEC) 40 MG capsule Take 1 capsule (40 mg total) by mouth daily before breakfast. 12/05/23 03/04/24  Pokhrel, Laxman, MD  spironolactone  (ALDACTONE ) 50 MG tablet Take 0.5 tablets (25 mg total) by mouth daily. 12/05/23 03/04/24  Sonjia Vernal, MD    Physical Exam: Vitals:   12/10/23 1830 12/10/23 1905 12/10/23 1930 12/10/23 2030  BP: (!) 136/104 132/76 119/89 (!) 128/59  Pulse: 85 85 79 (!) 125  Resp: 17 18    Temp:      TempSrc:      SpO2: 100% 98% 99% 98%   Constitutional: Sitting up in bed, NAD, calm, comfortable Eyes: EOMI, lids and conjunctivae normal ENMT: Mucous  membranes are moist. Posterior pharynx clear of any exudate or lesions.Normal dentition.  Neck: normal, supple, no masses. Respiratory: Coarse breath sounds. Normal respiratory effort. No accessory muscle use.  Cardiovascular: Regular rate and rhythm, no murmurs / rubs / gallops.  Tight bilateral lower extremity edema with woody induration.  2+ pedal pulses. Abdomen: Distended but soft abdomen, no tenderness, no masses palpated. Musculoskeletal: no clubbing / cyanosis. No joint deformity upper and lower extremities. Good ROM, no contractures. Normal muscle tone.  Skin: no rashes, lesions, ulcers. No induration Neurologic: Sensation intact. Strength 5/5 in all 4.  Psychiatric: Normal judgment and insight. Alert and oriented x 3. Normal mood.   EKG: Personally reviewed. Atrial flutter rate 84.  Assessment/Plan Principal Problem:   Alcoholic cirrhosis of liver with ascites (HCC) Active Problems:   Chronic kidney disease, stage 3b (HCC)   Chronic atrial flutter (HCC)   Thrombocytopenia (HCC)   Essential hypertension   Chronic heart failure with preserved ejection fraction (HFpEF) (HCC)   Esophageal varices without bleeding (HCC)   Anemia of chronic disease   History of CVA (cerebrovascular accident)   Hyperlipidemia   John [name] is a 68 y.o. 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  Assessment and Plan: Decompensated alcoholic cirrhosis with portal hypertension History of gastric/esophageal varices EGD 06/27/2020: In setting of medication nonadherence.  No sign of hepatic encephalopathy.  Denies signs/symptoms of GI bleed.  S/p paracentesis 8/6 with 6 L fluid off.  Will resume home meds. - Lasix   40 mg daily - Spironolactone  25 mg daily - Toprol -XL 12.5 mg daily - Midodrine  10 mg 3 times daily - Lactulose  as needed  CKD stage IIIb: Renal function relatively stable when compared to baseline labs over the last year.  Continue meds as above.  Persistent atrial flutter: Noted to have transient heart rate in the 120s with ambulation in the ED but otherwise stable while at rest.  Not on chronic anticoagulation due to history of cirrhosis, esophageal varices, prior GI bleed. - Continue Toprol -XL 12.5 mg daily  Chronic HFpEF: Hypervolemia likely primarily related to cirrhosis. TTE 05/10/2023 showed EF 55-60%, mild LVH. - Continue Toprol -XL, spironolactone , Lasix   Hypertension: Continue Toprol -XL, spironolactone , Lasix .  PAD/HLD/History of CVA: Continue atorvastatin .  Not on antiplatelet due to chronic anemia and thrombocytopenia.  Chronic anemia/thrombocytopenia: In setting of cirrhosis.  Hemoglobin platelets stable.  Gout: Continue allopurinol .   DVT prophylaxis: heparin  injection 5,000 Units Start: 12/10/23 2200 Code Status: Full code, confirmed with patient on admission Family Communication: Discussed with patient, he has discussed with family Disposition Plan: From home, dispo pending clinical progress Consults called: None Severity of Illness: The appropriate patient status for this patient is OBSERVATION. Observation status is judged to be reasonable and necessary in order to provide the required intensity of service to ensure the patient's safety. The patient's presenting symptoms, physical exam findings, and initial radiographic and laboratory data in the context of their medical condition is felt to place them at decreased risk for further clinical deterioration. Furthermore, it is anticipated that the patient will be medically stable for discharge from the hospital within 2 midnights of admission.   Jorie Blanch MD [facility]  If 7PM-7AM, please contact  night-coverage [email or web address]  12/10/2023, 8:39 PM

## 2023-12-10 NOTE — ED Triage Notes (Signed)
 BIBA from home- pt has c/o LLQ pain, has not taken fluid pill since 08/08- has swelling to BLE, and abdominal distention 20 g LAC 110 HR 99% 3 lpm 116/86 BP

## 2023-12-10 NOTE — ED Notes (Signed)
 Pt ambulated down the hallway by tech. Tech reported pt ambulated on his own ok. Pt's oxygen  saturation stayed at 100%, however pulse jumped from 90bpm to 120bpm.

## 2023-12-11 ENCOUNTER — Other Ambulatory Visit (HOSPITAL_COMMUNITY): Payer: Self-pay

## 2023-12-11 ENCOUNTER — Encounter (HOSPITAL_COMMUNITY): Payer: Self-pay | Admitting: Internal Medicine

## 2023-12-11 DIAGNOSIS — K7031 Alcoholic cirrhosis of liver with ascites: Secondary | ICD-10-CM | POA: Diagnosis not present

## 2023-12-11 LAB — CBC
HCT: 29.3 % — ABNORMAL LOW (ref 39.0–52.0)
Hemoglobin: 8.9 g/dL — ABNORMAL LOW (ref 13.0–17.0)
MCH: 24.3 pg — ABNORMAL LOW (ref 26.0–34.0)
MCHC: 30.4 g/dL (ref 30.0–36.0)
MCV: 79.8 fL — ABNORMAL LOW (ref 80.0–100.0)
Platelets: 117 K/uL — ABNORMAL LOW (ref 150–400)
RBC: 3.67 MIL/uL — ABNORMAL LOW (ref 4.22–5.81)
RDW: 17.7 % — ABNORMAL HIGH (ref 11.5–15.5)
WBC: 4.3 K/uL (ref 4.0–10.5)
nRBC: 0 % (ref 0.0–0.2)

## 2023-12-11 LAB — BASIC METABOLIC PANEL WITH GFR
Anion gap: 8 (ref 5–15)
BUN: 25 mg/dL — ABNORMAL HIGH (ref 8–23)
CO2: 24 mmol/L (ref 22–32)
Calcium: 8.3 mg/dL — ABNORMAL LOW (ref 8.9–10.3)
Chloride: 104 mmol/L (ref 98–111)
Creatinine, Ser: 1.9 mg/dL — ABNORMAL HIGH (ref 0.61–1.24)
GFR, Estimated: 38 mL/min — ABNORMAL LOW (ref >60)
Glucose, Bld: 93 mg/dL (ref 70–99)
Potassium: 4.1 mmol/L (ref 3.5–5.1)
Sodium: 136 mmol/L (ref 135–145)

## 2023-12-11 MED ORDER — ALLOPURINOL 100 MG PO TABS
50.0000 mg | ORAL_TABLET | Freq: Every day | ORAL | 0 refills | Status: AC
  Filled 2023-12-11: qty 45, 90d supply, fill #0

## 2023-12-11 MED ORDER — SPIRONOLACTONE 50 MG PO TABS
25.0000 mg | ORAL_TABLET | Freq: Every day | ORAL | 0 refills | Status: AC
  Filled 2023-12-11: qty 30, 60d supply, fill #0

## 2023-12-11 MED ORDER — ALBUTEROL SULFATE HFA 108 (90 BASE) MCG/ACT IN AERS
2.0000 | INHALATION_SPRAY | Freq: Four times a day (QID) | RESPIRATORY_TRACT | 4 refills | Status: AC | PRN
  Filled 2023-12-11: qty 6.7, 17d supply, fill #0

## 2023-12-11 MED ORDER — FUROSEMIDE 40 MG PO TABS
40.0000 mg | ORAL_TABLET | Freq: Every day | ORAL | 2 refills | Status: DC
  Filled 2023-12-11: qty 90, 90d supply, fill #0

## 2023-12-11 MED ORDER — POTASSIUM CHLORIDE ER 10 MEQ PO TBCR
10.0000 meq | EXTENDED_RELEASE_TABLET | Freq: Every day | ORAL | 0 refills | Status: AC
  Filled 2023-12-11: qty 90, 90d supply, fill #0

## 2023-12-11 MED ORDER — SALONPAS PAIN RELIEF PATCH EX PTCH: 1.0000 | MEDICATED_PATCH | Freq: Every day | CUTANEOUS | Status: AC | PRN

## 2023-12-11 MED ORDER — KETOCONAZOLE 2 % EX SHAM
1.0000 | MEDICATED_SHAMPOO | CUTANEOUS | 0 refills | Status: AC
  Filled 2023-12-11: qty 120, 30d supply, fill #0

## 2023-12-11 MED ORDER — LACTULOSE 10 GM/15ML PO SOLN
10.0000 g | Freq: Two times a day (BID) | ORAL | 0 refills | Status: AC | PRN
  Filled 2023-12-11: qty 236, 8d supply, fill #0

## 2023-12-11 MED ORDER — OMEPRAZOLE 40 MG PO CPDR
40.0000 mg | DELAYED_RELEASE_CAPSULE | Freq: Every day | ORAL | 0 refills | Status: AC
  Filled 2023-12-11: qty 90, 90d supply, fill #0

## 2023-12-11 MED ORDER — METOPROLOL SUCCINATE ER 25 MG PO TB24
25.0000 mg | ORAL_TABLET | Freq: Every day | ORAL | 0 refills | Status: AC
  Filled 2023-12-11: qty 90, 90d supply, fill #0

## 2023-12-11 MED ORDER — FEROSUL 325 (65 FE) MG PO TABS
325.0000 mg | ORAL_TABLET | ORAL | 0 refills | Status: AC
  Filled 2023-12-11: qty 36, 84d supply, fill #0

## 2023-12-11 MED ORDER — QVAR REDIHALER 80 MCG/ACT IN AERB: 1.0000 | INHALATION_SPRAY | Freq: Two times a day (BID) | RESPIRATORY_TRACT | 12 refills | Status: AC

## 2023-12-11 MED ORDER — BRIMONIDINE TARTRATE 0.2 % OP SOLN
1.0000 [drp] | Freq: Two times a day (BID) | OPHTHALMIC | 12 refills | Status: AC
  Filled 2023-12-11: qty 5, 50d supply, fill #0

## 2023-12-11 MED ORDER — HYPROMELLOSE (GONIOSCOPIC) 2.5 % OP SOLN: 1.0000 [drp] | Freq: Four times a day (QID) | OPHTHALMIC | Status: AC | PRN

## 2023-12-11 MED ORDER — FOLIC ACID 1 MG PO TABS
1.0000 mg | ORAL_TABLET | Freq: Every day | ORAL | 0 refills | Status: AC
  Filled 2023-12-11: qty 90, 90d supply, fill #0

## 2023-12-11 MED ORDER — ATORVASTATIN CALCIUM 40 MG PO TABS
40.0000 mg | ORAL_TABLET | Freq: Every day | ORAL | 0 refills | Status: AC
  Filled 2023-12-11: qty 90, 90d supply, fill #0

## 2023-12-11 MED ORDER — MELATONIN 5 MG PO TABS: 5.0000 mg | ORAL_TABLET | Freq: Every day | ORAL | Status: AC

## 2023-12-11 NOTE — Plan of Care (Signed)
  Problem: Clinical Measurements: Goal: Cardiovascular complication will be avoided Outcome: Progressing   Problem: Clinical Measurements: Goal: Respiratory complications will improve Outcome: Progressing   

## 2023-12-11 NOTE — Plan of Care (Signed)
   Problem: Education: Goal: Knowledge of General Education information will improve Description: Including pain rating scale, medication(s)/side effects and non-pharmacologic comfort measures Outcome: Progressing   Problem: Clinical Measurements: Goal: Ability to maintain clinical measurements within normal limits will improve Outcome: Progressing   Problem: Clinical Measurements: Goal: Will remain free from infection Outcome: Progressing   Problem: Clinical Measurements: Goal: Diagnostic test results will improve Outcome: Progressing   Problem: Activity: Goal: Risk for activity intolerance will decrease Outcome: Progressing

## 2023-12-11 NOTE — Plan of Care (Signed)
  Problem: Education: Goal: Knowledge of General Education information will improve Description: Including pain rating scale, medication(s)/side effects and non-pharmacologic comfort measures 12/11/2023 1535 by Jene Hurl, RN Outcome: Adequate for Discharge 12/11/2023 1530 by Jene Hurl, RN Outcome: Progressing   Problem: Health Behavior/Discharge Planning: Goal: Ability to manage health-related needs will improve 12/11/2023 1535 by Jene Hurl, RN Outcome: Adequate for Discharge 12/11/2023 1530 by Jene Hurl, RN Outcome: Progressing   Problem: Clinical Measurements: Goal: Ability to maintain clinical measurements within normal limits will improve 12/11/2023 1535 by Jene Hurl, RN Outcome: Adequate for Discharge 12/11/2023 1530 by Jene Hurl, RN Outcome: Progressing Goal: Will remain free from infection 12/11/2023 1535 by Jene Hurl, RN Outcome: Adequate for Discharge 12/11/2023 1530 by Jene Hurl, RN Outcome: Progressing Goal: Diagnostic test results will improve 12/11/2023 1535 by Jene Hurl, RN Outcome: Adequate for Discharge 12/11/2023 1530 by Jene Hurl, RN Outcome: Progressing Goal: Respiratory complications will improve 12/11/2023 1535 by Jene Hurl, RN Outcome: Adequate for Discharge 12/11/2023 1530 by Jene Hurl, RN Outcome: Progressing Goal: Cardiovascular complication will be avoided 12/11/2023 1535 by Jene Hurl, RN Outcome: Adequate for Discharge 12/11/2023 1530 by Jene Hurl, RN Outcome: Progressing   Problem: Activity: Goal: Risk for activity intolerance will decrease 12/11/2023 1535 by Jene Hurl, RN Outcome: Adequate for Discharge 12/11/2023 1530 by Jene Hurl, RN Outcome: Progressing   Problem: Nutrition: Goal: Adequate nutrition will be maintained 12/11/2023 1535 by Jene Hurl, RN Outcome: Adequate for Discharge 12/11/2023 1530 by Jene Hurl, RN Outcome: Progressing   Problem: Coping: Goal: Level of anxiety will  decrease 12/11/2023 1535 by Jene Hurl, RN Outcome: Adequate for Discharge 12/11/2023 1530 by Jene Hurl, RN Outcome: Progressing   Problem: Elimination: Goal: Will not experience complications related to bowel motility 12/11/2023 1535 by Jene Hurl, RN Outcome: Adequate for Discharge 12/11/2023 1530 by Jene Hurl, RN Outcome: Progressing Goal: Will not experience complications related to urinary retention 12/11/2023 1535 by Jene Hurl, RN Outcome: Adequate for Discharge 12/11/2023 1530 by Jene Hurl, RN Outcome: Progressing   Problem: Pain Managment: Goal: General experience of comfort will improve and/or be controlled 12/11/2023 1535 by Jene Hurl, RN Outcome: Adequate for Discharge 12/11/2023 1530 by Jene Hurl, RN Outcome: Progressing   Problem: Safety: Goal: Ability to remain free from injury will improve 12/11/2023 1535 by Jene Hurl, RN Outcome: Adequate for Discharge 12/11/2023 1530 by Jene Hurl, RN Outcome: Progressing   Problem: Skin Integrity: Goal: Risk for impaired skin integrity will decrease 12/11/2023 1535 by Jene Hurl, RN Outcome: Adequate for Discharge 12/11/2023 1530 by Jene Hurl, RN Outcome: Progressing
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
## 2023-12-11 NOTE — Care Management Obs Status (Signed)
 MEDICARE OBSERVATION STATUS NOTIFICATION   Patient Details  Name: John [name] MRN: [id number] Date of Birth: 05/16/1955   Medicare Observation Status Notification Given:  Yes    Sonda Manuella Quill, RN 12/11/2023, 11:14 AM

## 2023-12-11 NOTE — TOC Initial Note (Addendum)
 Transition of Care [facility]) - Initial/Assessment Note    Patient Details  Name: John [name] MRN: [id number] Date of Birth: 22-Aug-1955  Transition of [facility]) CM/SW Contact:    Sonda Manuella Quill, RN Phone Number: 12/11/2023, 11:54 AM  Clinical Narrative:                 TOC for d/c planning; spoke w/ pt in room; pt says he lives at home; he plans to return at d/c; pt identified POC Burlin Mcnair (niece) [phone]; transportation, and [facility] aide from [facility]; pt verified insurance/PCP; pt said he has difficulty paying for food b/c he has been having problems w/ his food stamps; he denied other SDOH risks; pt has Rollator; he does not have home oxygen ; pt agreed to receive resource for social services; resource placed in d/c instructions; copy of resource also given to pt; he will make his own appt w/ agency; TOC is following.  Expected Discharge Plan: Home/Self Care Barriers to Discharge: Continued Medical Work up   Patient Goals and CMS Choice Patient states their goals for this hospitalization and ongoing recovery are:: home CMS Medicare.gov Compare Post Acute Care list provided to:: Patient   [place] ownership interest in [facility].provided to:: Patient    Expected Discharge Plan and Services   Discharge Planning Services: CM Consult   Living arrangements for the past 2 months: Apartment                                      Prior Living Arrangements/Services Living arrangements for the past 2 months: Apartment Lives with:: Self Patient language and need for interpreter reviewed:: Yes Do you feel safe going back to the place where you live?: Yes      Need for Family Participation in Patient Care: Yes (Comment) Care giver support system in place?: Yes (comment) Current home services: DME, Homehealth aide (Rollator, shower chair, HH aide from [facility]) Criminal Activity/Legal Involvement Pertinent to Current  Situation/Hospitalization: No - Comment as needed  Activities of Daily Living   ADL Screening (condition at time of admission) Independently performs ADLs?: No Does the patient have a NEW difficulty with bathing/dressing/toileting/self-feeding that is expected to last >3 days?: No Does the patient have a NEW difficulty with getting in/out of bed, walking, or climbing stairs that is expected to last >3 days?: No Does the patient have a NEW difficulty with communication that is expected to last >3 days?: No Is the patient deaf or have difficulty hearing?: No Does the patient have difficulty seeing, even when wearing glasses/contacts?: No Does the patient have difficulty concentrating, remembering, or making decisions?: No  Permission Sought/Granted Permission sought to share information with : Case Manager Permission granted to share information with : Yes, Verbal Permission Granted  Share Information with NAME: Case Manager     Permission granted to share info w Relationship: Andrei Mccook (niece) [phone]     Emotional Assessment Appearance:: Appears stated age Attitude/Demeanor/Rapport: Gracious Affect (typically observed): Accepting Orientation: : Oriented to Self, Oriented to Place, Oriented to  Time, Oriented to Situation Alcohol / Substance Use: Not Applicable Psych Involvement: No (comment)  Admission diagnosis:  Shortness of breath [R06.02] Alcoholic cirrhosis of liver with ascites (HCC) [K70.31] Atrial fib/flutter, transient (HCC) [I48.91, I48.92] Patient Active Problem List   Diagnosis Date Noted   Anemia of chronic disease 12/10/2023   History of CVA (  cerebrovascular accident) 12/10/2023   Hyperlipidemia 12/10/2023   Hypotension (arterial) 12/04/2023   Status post abdominal paracentesis 12/04/2023   Chronic atrial fibrillation (HCC) 12/04/2023   Abdominal ascites 12/02/2023   Bilateral lower extremity edema 10/16/2023   Acute on chronic diastolic  (congestive) heart failure (HCC) 10/15/2023   Chronic anemia 07/01/2023   Acute exacerbation of CHF (congestive heart failure) (HCC) 06/30/2023   History of hepatitis C 05/14/2023   Portal hypertension (HCC) 05/14/2023   Decompensated cirrhosis (HCC) 05/14/2023   Chronic atrial flutter (HCC) 05/12/2023   Hypomagnesemia 05/08/2023   New onset atrial fibrillation (HCC) 05/07/2023   Stasis dermatitis of both legs 08/05/2022   Glaucoma (increased eye pressure) 08/05/2022   COPD with acute exacerbation (HCC) 08/05/2022   Acute exacerbation of congestive heart failure (HCC) 07/31/2022   Renal mass, left 07/05/2020   Umbilical hernia without obstruction and without gangrene 04/27/2019   Mild intermittent asthma without complication 04/27/2019   Severe obesity (BMI 35.0-39.9) with comorbidity (HCC) 06/25/2018   Acute gouty arthritis 05/06/2018   Polyarthralgia 05/03/2018   History of renal cell carcinoma 01/12/2018   Esophageal varices without bleeding (HCC)    Renal cell carcinoma of left kidney (HCC) 11/08/2016   Malnutrition of moderate degree 07/20/2015   Chronic heart failure with preserved ejection fraction (HFpEF) (HCC) 07/19/2015   Abdominal pain 07/19/2015   Renal failure (ARF), acute on chronic (HCC) 07/19/2015   Acute-on-chronic kidney injury (HCC)    Hyponatremia 07/07/2015   Dehydration 07/07/2015   Acute respiratory failure (HCC) 07/07/2015   Acute respiratory failure with hypoxia (HCC) 05/23/2015   Fluid overload 10/31/2014   Hepatitis C 10/31/2014   Hepatitis, viral    Edema of abdominal wall    Pedal edema    Alcoholism (HCC)    Abdominal edema on examination 09/01/2014   Chronic liver failure (HCC)    Alcoholic cirrhosis of liver with ascites (HCC)    Chronic hepatitis C with cirrhosis (HCC)    Cardiac hypertrophy    Fever of unknown origin    Diastolic dysfunction    Altered mental status    Metabolic encephalopathy    Pulmonary edema    Acute encephalopathy     Ascites due to alcoholic cirrhosis (HCC)    Hypernatremia    Other specified fever    Varices, gastric    Acute on chronic renal failure (HCC)    Elevated transaminase level    Essential hypertension    Gastric varices    Upper GI bleed 08/14/2014   Acute blood loss anemia 08/14/2014   Alcohol abuse 08/14/2014   Coagulopathy (HCC) 08/14/2014   AKI (acute kidney injury) (HCC) 08/14/2014   GI bleed 08/14/2014   GIB (gastrointestinal bleeding) 08/14/2014   Bleeding gastric varices 08/14/2014   Acute upper GI bleed    Cirrhosis (HCC) 07/27/2014   Abdominal pain, generalized 07/27/2014   Ascites 07/26/2014   Optic neuritis 07/11/2014   Abnormal LFTs    Vision loss of left eye 06/24/2014   Accelerated hypertension 06/24/2014   Elevated LFTs 06/24/2014   Polysubstance abuse (HCC) 06/24/2014   Chronic kidney disease, stage 3b (HCC) 06/24/2014   SOB (shortness of breath) 06/24/2014   Vision loss, left eye 06/24/2014   Thrombocytopenia (HCC) 06/24/2014   Hypokalemia 06/24/2014   Elevated troponin 06/24/2014   Neck pain 12/29/2012   Low back pain 12/29/2012   PCP:  Cloria Annabella CROME, DO Pharmacy:   [facility] - [facility] [address] [place]  [place] [address] Phone: [phone] Fax: [phone]     Social Drivers of Health (SDOH) Social History: SDOH Screenings   Food Insecurity: Food Insecurity Present (12/11/2023)  Housing: Low Risk  (12/11/2023)  Transportation Needs: No Transportation Needs (12/11/2023)  Utilities: Not At Risk (12/11/2023)  Social Connections: Moderately Integrated (12/10/2023)  Tobacco Use: Medium Risk (12/11/2023)   SDOH Interventions: Food Insecurity Interventions: [employer] Provided, Inpatient TOC Housing Interventions: Intervention Not Indicated, Inpatient TOC Transportation Interventions: Intervention Not Indicated, Inpatient TOC Utilities Interventions: Intervention Not Indicated, Inpatient TOC   Readmission  Risk Interventions    10/17/2023   12:18 PM 07/02/2023    2:22 PM  Readmission Risk Prevention Plan  Transportation Screening Complete Complete  Medication Review Oceanographer) Complete Complete  HRI or Home Care Consult Complete Complete  SW Recovery Care/Counseling Consult Complete Complete  Palliative Care Screening Not Applicable Not Applicable  Skilled Nursing Facility Not Applicable Complete

## 2023-12-11 NOTE — TOC Transition Note (Addendum)
 Transition of Care [facility]) - Discharge Note   Patient Details  Name: NOCHOLAS DAMASO MRN: [id number] Date of Birth: 1956-04-04  Transition of Care [facility]) CM/SW Contact:  Sonda Manuella Quill, RN Phone Number: 12/11/2023, 3:26 PM   Clinical Narrative:    D/C orders received; LVM for Randine Hummer, [phone] and Jon Novak, SW [phone] at [facility] so that transportation an be arranged; awaiting return call  [phone] spoke w/ Randine; pt's d/c confirmed; she will notify transportation; she was also given # for [place], RN and unit; no TOC needs.  Final next level of care: Home/Self Care Barriers to Discharge: No Barriers Identified   Patient Goals and CMS Choice Patient states their goals for this hospitalization and ongoing recovery are:: home CMS Medicare.gov Compare Post Acute Care list provided to:: Patient   [place] ownership interest in [facility].provided to:: Patient    Discharge Placement                       Discharge Plan and Services Additional resources added to the After Visit Summary for     Discharge Planning Services: CM Consult                                 Social Drivers of Health (SDOH) Interventions SDOH Screenings   Food Insecurity: Food Insecurity Present (12/11/2023)  Housing: Low Risk  (12/11/2023)  Transportation Needs: No Transportation Needs (12/11/2023)  Utilities: Not At Risk (12/11/2023)  Social Connections: Moderately Integrated (12/10/2023)  Tobacco Use: Medium Risk (12/11/2023)     Readmission Risk Interventions    10/17/2023   12:18 PM 07/02/2023    2:22 PM  Readmission Risk Prevention Plan  Transportation Screening Complete Complete  Medication Review Ocean[facility]) Complete Complete  HRI or Home Care Consult Complete Complete  SW Recovery Care/Counseling Consult Complete Complete  Palliative Care Screening Not Applicable Not Applicable  Skilled Nursing Facility Not Applicable Complete

## 2023-12-11 NOTE — TOC Progression Note (Signed)
 Transition of Care [facility]) - Progression Note    Patient Details  Name: John [name] MRN: [id number] Date of Birth: 1956/03/22  Transition of Care [facility]) CM/SW Contact  Sonda Manuella Quill, RN Phone Number: 12/11/2023, 1:20 PM  Clinical Narrative:    Spoke w/ Randine Hummer at [facility]; she was notified pt may d/c today; she said pt must be picked up by 4 PM if agency will be providing transportation services; Randine can be contacted at [phone]; she will also arrange transportation; Dr Earley notified via secure chat.  Expected Discharge Plan: Home/Self Care Barriers to Discharge: Continued Medical Work up               Expected Discharge Plan and Services   Discharge Planning Services: CM Consult   Living arrangements for the past 2 months: Apartment                                       Social Drivers of Health (SDOH) Interventions SDOH Screenings   Food Insecurity: Food Insecurity Present (12/11/2023)  Housing: Low Risk  (12/11/2023)  Transportation Needs: No Transportation Needs (12/11/2023)  Utilities: Not At Risk (12/11/2023)  Social Connections: Moderately Integrated (12/10/2023)  Tobacco Use: Medium Risk (12/11/2023)    Readmission Risk Interventions    10/17/2023   12:18 PM 07/02/2023    2:22 PM  Readmission Risk Prevention Plan  Transportation Screening Complete Complete  Medication Review Oceanographer) Complete Complete  HRI or Home Care Consult Complete Complete  SW Recovery Care/Counseling Consult Complete Complete  Palliative Care Screening Not Applicable Not Applicable  Skilled Nursing Facility Not Applicable Complete

## 2023-12-16 ENCOUNTER — Emergency Department (HOSPITAL_COMMUNITY)

## 2023-12-16 ENCOUNTER — Encounter (HOSPITAL_COMMUNITY): Payer: Self-pay

## 2023-12-16 ENCOUNTER — Other Ambulatory Visit: Payer: Self-pay

## 2023-12-16 ENCOUNTER — Emergency Department (HOSPITAL_COMMUNITY)
Admission: EM | Admit: 2023-12-16 | Discharge: 2023-12-16 | Disposition: A | Discharge status: Home / Self Care | Attending: Emergency Medicine | Admitting: Emergency Medicine

## 2023-12-16 ENCOUNTER — Other Ambulatory Visit (HOSPITAL_COMMUNITY): Payer: Self-pay

## 2023-12-16 DIAGNOSIS — N183 Chronic kidney disease, stage 3 unspecified: Secondary | ICD-10-CM | POA: Diagnosis not present

## 2023-12-16 DIAGNOSIS — R140 Abdominal distension (gaseous): Secondary | ICD-10-CM | POA: Insufficient documentation

## 2023-12-16 DIAGNOSIS — K7031 Alcoholic cirrhosis of liver with ascites: Secondary | ICD-10-CM

## 2023-12-16 DIAGNOSIS — I509 Heart failure, unspecified: Secondary | ICD-10-CM | POA: Insufficient documentation

## 2023-12-16 DIAGNOSIS — Z79899 Other long term (current) drug therapy: Secondary | ICD-10-CM | POA: Diagnosis not present

## 2023-12-16 DIAGNOSIS — J45909 Unspecified asthma, uncomplicated: Secondary | ICD-10-CM | POA: Insufficient documentation

## 2023-12-16 DIAGNOSIS — R0602 Shortness of breath: Secondary | ICD-10-CM | POA: Diagnosis present

## 2023-12-16 DIAGNOSIS — I2510 Atherosclerotic heart disease of native coronary artery without angina pectoris: Secondary | ICD-10-CM | POA: Insufficient documentation

## 2023-12-16 DIAGNOSIS — I130 Hypertensive heart and chronic kidney disease with heart failure and stage 1 through stage 4 chronic kidney disease, or unspecified chronic kidney disease: Secondary | ICD-10-CM | POA: Insufficient documentation

## 2023-12-16 DIAGNOSIS — J189 Pneumonia, unspecified organism: Secondary | ICD-10-CM

## 2023-12-16 HISTORY — PX: IR PARACENTESIS: IMG2679

## 2023-12-16 LAB — BRAIN NATRIURETIC PEPTIDE: B Natriuretic Peptide: 292.6 pg/mL — ABNORMAL HIGH (ref 0.0–100.0)

## 2023-12-16 LAB — COMPREHENSIVE METABOLIC PANEL WITH GFR
ALT: 11 U/L (ref 0–44)
AST: 20 U/L (ref 15–41)
Albumin: 3.1 g/dL — ABNORMAL LOW (ref 3.5–5.0)
Alkaline Phosphatase: 63 U/L (ref 38–126)
Anion gap: 9 (ref 5–15)
BUN: 20 mg/dL (ref 8–23)
CO2: 22 mmol/L (ref 22–32)
Calcium: 8.7 mg/dL — ABNORMAL LOW (ref 8.9–10.3)
Chloride: 106 mmol/L (ref 98–111)
Creatinine, Ser: 1.46 mg/dL — ABNORMAL HIGH (ref 0.61–1.24)
GFR, Estimated: 52 mL/min — ABNORMAL LOW (ref >60)
Glucose, Bld: 84 mg/dL (ref 70–99)
Potassium: 3.9 mmol/L (ref 3.5–5.1)
Sodium: 137 mmol/L (ref 135–145)
Total Bilirubin: 1.2 mg/dL (ref 0.0–1.2)
Total Protein: 7 g/dL (ref 6.5–8.1)

## 2023-12-16 LAB — CBC WITH DIFFERENTIAL/PLATELET
Abs Immature Granulocytes: 0.02 K/uL (ref 0.00–0.07)
Basophils Absolute: 0 K/uL (ref 0.0–0.1)
Basophils Relative: 1 %
Eosinophils Absolute: 0.1 K/uL (ref 0.0–0.5)
Eosinophils Relative: 1 %
HCT: 31.7 % — ABNORMAL LOW (ref 39.0–52.0)
Hemoglobin: 9.8 g/dL — ABNORMAL LOW (ref 13.0–17.0)
Immature Granulocytes: 1 %
Lymphocytes Relative: 9 %
Lymphs Abs: 0.4 K/uL — ABNORMAL LOW (ref 0.7–4.0)
MCH: 24.5 pg — ABNORMAL LOW (ref 26.0–34.0)
MCHC: 30.9 g/dL (ref 30.0–36.0)
MCV: 79.3 fL — ABNORMAL LOW (ref 80.0–100.0)
Monocytes Absolute: 0.6 K/uL (ref 0.1–1.0)
Monocytes Relative: 14 %
Neutro Abs: 3.2 K/uL (ref 1.7–7.7)
Neutrophils Relative %: 74 %
Platelets: 112 K/uL — ABNORMAL LOW (ref 150–400)
RBC: 4 MIL/uL — ABNORMAL LOW (ref 4.22–5.81)
RDW: 17.6 % — ABNORMAL HIGH (ref 11.5–15.5)
WBC: 4.4 K/uL (ref 4.0–10.5)
nRBC: 0 % (ref 0.0–0.2)

## 2023-12-16 LAB — TROPONIN I (HIGH SENSITIVITY): Troponin I (High Sensitivity): 14 ng/L (ref <18)

## 2023-12-16 LAB — PROTIME-INR
INR: 1.3 — ABNORMAL HIGH (ref 0.8–1.2)
Prothrombin Time: 16.5 s — ABNORMAL HIGH (ref 11.4–15.2)

## 2023-12-16 LAB — LIPASE, BLOOD: Lipase: 33 U/L (ref 11–51)

## 2023-12-16 LAB — APTT: aPTT: 31 s (ref 24–36)

## 2023-12-16 MED ORDER — LIDOCAINE HCL 1 % IJ SOLN
20.0000 mL | Freq: Once | INTRAMUSCULAR | Status: AC
  Administered 2023-12-16: 10 mL

## 2023-12-16 MED ORDER — LIDOCAINE HCL 1 % IJ SOLN
INTRAMUSCULAR | Status: AC
  Filled 2023-12-16: qty 20

## 2023-12-16 MED ORDER — ALBUMIN HUMAN 25 % IV SOLN
50.0000 g | Freq: Once | INTRAVENOUS | Status: AC
  Administered 2023-12-16: 50 g via INTRAVENOUS
  Filled 2023-12-16: qty 200

## 2023-12-16 MED ORDER — CEFPODOXIME PROXETIL 200 MG PO TABS
200.0000 mg | ORAL_TABLET | Freq: Two times a day (BID) | ORAL | 0 refills | Status: AC
  Filled 2023-12-16: qty 14, 7d supply, fill #0

## 2023-12-16 MED ORDER — DOXYCYCLINE HYCLATE 100 MG PO CAPS
100.0000 mg | ORAL_CAPSULE | Freq: Two times a day (BID) | ORAL | 0 refills | Status: AC
  Filled 2023-12-16: qty 10, 5d supply, fill #0

## 2023-12-16 NOTE — ED Provider Notes (Signed)
 Received patient in signout from previous provider pending paracentesis.  See his note.  In short, patient presents to Emergency Department for evaluation abdominal distention, shortness of breath.  Reports that he typically feels short of breath when he knows that he needs to have paracentesis done.  Typically has paracentesis a couple times a month.  Last time was 2 weeks ago.  Lab work notable for INR of 1.3, BNP 292, Hgb 9.8, PLT 112, creatinine 1.46 Chest x-ray notable for suspicion for right upper lobe pneumonia  Consulted pharm who recommended albumin  50g following 5L fluid removed 2/2 paracentesis  Endorses a chronic cough but will also provide cefpodoxime , Doxy for suspected right upper lobe pneumonia as patient has significant co morbidities.  Reassessed patient following paracentesis.  He feels completely better and is asking to be discharged.  He is speaking in full complete sentences with no signs of respiratory distress.  Discussed ED workup, disposition, return to ED precautions with patient who expresses understanding agrees with plan.  All questions answered to their satisfaction.  They are agreeable to plan.  Discharge instructions provided on paperwork   John [name], [name] 12/16/23 2033    [name] PARAS, MD 12/16/23 2218

## 2023-12-16 NOTE — Discharge Instructions (Addendum)
 Thank you for letting us  evaluate you today.  We have drained your abdomen.  We have also provided you with albumin  for fluid loss.  I have also sent antibiotics to your pharmacy for possible pneumonia.  Please take these as prescribed and do not miss a dose even if you are feeling better.  Please follow-up with primary care provider in 1 month for repeat chest x-ray to ensure pneumonia has resolved  Please return to emergency department if you experience debilitating abdominal pain, shortness of breath, chest pain, abdominal distention requiring tapping of abdomen

## 2023-12-16 NOTE — Procedures (Signed)
 PROCEDURE SUMMARY:  Successful ultrasound guided therapeutic paracentesis from the right lower quadrant.  Yielded 6 liters of slightly hazy, amber fluid.  No immediate complications.  The patient tolerated the procedure well.     EBL < 2 cc  The patient has previously been formally evaluated by the [facility] and is being actively followed for potential future intervention.   Kevin Zachry Hopfensperger,PA-C

## 2023-12-16 NOTE — ED Triage Notes (Signed)
 Pt BIB [facility] with reports of ascites. Pt states that he needs to get his stomach pumped.

## 2023-12-16 NOTE — ED Provider Notes (Signed)
 [place] EMERGENCY DEPARTMENT AT [facility] Provider Note   CSN: [id number] Arrival date & time: 12/16/23  9155     Patient presents with: Abdominal Pain   John [name] is a 68 y.o. male history of alcoholic cirrhosis with portal hypertension and recurrent ascites, HFpEF, CKD, A-fib not on AC, hypertension, hyperlipidemia presents with complaints of shortness of breath and abdominal distention. States that this has been ongoing for the past few days.  Believes he needs a paracentesis.  Last 1 was 2 weeks ago.  Denies significant abdominal pain, no vomiting or diarrhea.  Has no associated chest pain.  Does endorse a cough without any other URI symptoms.   =   Abdominal Pain     Past Medical History:  Diagnosis Date   Allergy    Arthritis    oa back   Asthma    CAP (community acquired pneumonia) 05/23/2015   Cataract    both eyes   CHF (congestive heart failure) (HCC) 2001   sees primary for   Chronic back pain    Chronic kidney disease    ckd 3   Cirrhosis (HCC)    alcoholic with h/o varices, ascites   Coronary artery disease    nonobstrucrtibe   ETOH abuse    GERD (gastroesophageal reflux disease)    Gout    Hepatitis    hepatitic c 2018 24 week tx with ecuplipsa, no hepatitic c detected after tx   History of blood transfusion 8-9- yrs ago   Hypertension    Optic neuropathy, left    PAD (peripheral artery disease) (HCC)    slight  primary manages   Polysubstance abuse (HCC)    Primary hypertension    Renal cell carcinoma (HCC) 2016, 2018 and 2022   left ablation   Seizures [facility])    age 54 none since   Sickle cell trait (HCC)    Stroke (HCC) 1998   light no problems with   Uses walker 12/04/2020   Wears dentures    Wears glasses      Prior to Admission medications   Medication Sig Start Date End Date Taking? Authorizing Provider  albuterol  (PROAIR  HFA) 108 (90 Base) MCG/ACT inhaler Inhale 2 puffs into the lungs every 6 (six) hours  as needed for wheezing or shortness of breath. 12/11/23   Rizwan, Saima, MD  allopurinol  (ZYLOPRIM ) 100 MG tablet Take 0.5 tablets (50 mg total) by mouth daily. 12/11/23 03/10/24  Rizwan, Saima, MD  atorvastatin  (LIPITOR) 40 MG tablet Take 1 tablet (40 mg total) by mouth at bedtime. 12/11/23 03/10/24  Rizwan, Saima, MD  beclomethasone (QVAR  REDIHALER) 80 MCG/ACT inhaler Inhale 1 puff into the lungs 2 (two) times daily. 12/11/23   Rizwan, Saima, MD  brimonidine  (ALPHAGAN ) 0.2 % ophthalmic solution Place 1 drop into both eyes 2 (two) times daily. 12/11/23   Rizwan, Saima, MD  FEROSUL 325 (65 Fe) MG tablet Take 1 tablet (325 mg total) by mouth every Monday, Wednesday, and Friday. 12/12/23 03/11/24  Rizwan, Saima, MD  folic acid  (FOLVITE ) 1 MG tablet Take 1 tablet (1 mg total) by mouth daily. 12/11/23 03/10/24  Rizwan, Saima, MD  furosemide  (LASIX ) 40 MG tablet Take 1 tablet (40 mg total) by mouth daily. 12/11/23 03/10/24  Rizwan, Saima, MD  hydroxypropyl methylcellulose / hypromellose (ISOPTO TEARS / GONIOVISC) 2.5 % ophthalmic solution Place 1 drop into both eyes 4 (four) times daily as needed for dry eyes. 12/11/23 01/10/24  Rizwan, Saima, MD  ketoconazole  (  NIZORAL ) 2 % shampoo Apply topically to shampoo the scalp 2 times a week 12/11/23   Rizwan, Saima, MD  lactulose  (CHRONULAC ) 10 GM/15ML solution Take 15 mLs (10 g total) by mouth 2 (two) times daily as needed for mild constipation. 12/11/23   Rizwan, Saima, MD  melatonin 5 MG TABS Take 1 tablet (5 mg total) by mouth at bedtime. 12/11/23 03/10/24  Rizwan, Saima, MD  Menthol -Methyl Salicylate  (SALONPAS  PAIN RELIEF  PATCH) PTCH Apply 1 patch topically daily as needed (for mid-back pain and lower back pain). 12/11/23   Rizwan, Saima, MD  metoprolol  succinate (TOPROL  XL) 25 MG 24 hr tablet Take 1 tablet (25 mg total) by mouth at bedtime. 12/11/23 03/10/24  Rizwan, Saima, MD  omeprazole  (PRILOSEC) 40 MG capsule Take 1 capsule (40 mg total) by mouth daily before breakfast.  12/11/23 03/10/24  Rizwan, Saima, MD  potassium chloride  (KLOR-CON ) 10 MEQ tablet Take 1 tablet (10 mEq total) by mouth daily. 12/11/23 03/10/24  Rizwan, Saima, MD  spironolactone  (ALDACTONE ) 50 MG tablet Take 0.5 tablets (25 mg total) by mouth daily for 60 doses. 12/11/23 02/09/24  Rizwan, Saima, MD    Allergies: Penicillins    Review of Systems  Gastrointestinal:  Positive for abdominal pain.    Updated Vital Signs BP 102/89 (BP Location: Right Arm)   Pulse 82   Temp 98.7 F (37.1 C) (Oral)   Resp (!) 23   SpO2 99%   Physical Exam Vitals and nursing note reviewed.  Constitutional:      General: He is not in acute distress.    Appearance: He is well-developed.  HENT:     Head: Normocephalic and atraumatic.  Eyes:     Conjunctiva/sclera: Conjunctivae normal.  Cardiovascular:     Rate and Rhythm: Normal rate and regular rhythm.     Heart sounds: No murmur heard. Pulmonary:     Effort: Pulmonary effort is normal. No respiratory distress.     Comments: Faint bibasilar crackles, no wheezing Abdominal:     Palpations: Abdomen is soft.     Tenderness: There is abdominal tenderness.     Comments: Abdomen distended with mild suprapubic abdominal tenderness, no peritoneal signs  Musculoskeletal:        General: No swelling.     Cervical back: Neck supple.  Skin:    General: Skin is warm and dry.     Capillary Refill: Capillary refill takes less than 2 seconds.  Neurological:     Mental Status: He is alert.  Psychiatric:        Mood and Affect: Mood normal.     (all labs ordered are listed, but only abnormal results are displayed) Labs Reviewed  CBC WITH DIFFERENTIAL/PLATELET - Abnormal; Notable for the following components:      Result Value   RBC 4.00 (*)    Hemoglobin 9.8 (*)    HCT 31.7 (*)    MCV 79.3 (*)    MCH 24.5 (*)    RDW 17.6 (*)    Platelets 112 (*)    Lymphs Abs 0.4 (*)    All other components within normal limits  COMPREHENSIVE METABOLIC PANEL WITH  GFR - Abnormal; Notable for the following components:   Creatinine, Ser 1.46 (*)    Calcium  8.7 (*)    Albumin  3.1 (*)    GFR, Estimated 52 (*)    All other components within normal limits  PROTIME-INR - Abnormal; Notable for the following components:   Prothrombin Time 16.5 (*)  INR 1.3 (*)    All other components within normal limits  BRAIN NATRIURETIC PEPTIDE - Abnormal; Notable for the following components:   B Natriuretic Peptide 292.6 (*)    All other components within normal limits  LIPASE, BLOOD  APTT  TROPONIN I (HIGH SENSITIVITY)  TROPONIN I (HIGH SENSITIVITY)    EKG: EKG Interpretation Date/Time:  Tuesday December 16 2023 08:54:24 EDT Ventricular Rate:  81 PR Interval:    QRS Duration:  89 QT Interval:  389 QTC Calculation: 452 R Axis:   61  Text Interpretation: Atrial flutter with predominant 3:1 AV block Borderline low voltage, extremity leads Nonspecific T abnormalities, diffuse leads No significant change since last tracing Confirmed by Patt Alm DEL [phone]) on 12/16/2023 9:46:13 AM  Radiology: [facility] Chest 2 View Result Date: 12/16/2023 CLINICAL DATA:  Shortness of breath. EXAM: CHEST - 2 VIEW COMPARISON:  Radiographs 12/10/2023 and 12/04/2023. Chest CTA 05/23/2015. Abdominal CT 12/10/2023. FINDINGS: The heart size and mediastinal contours are stable with cardiomegaly and aortic atherosclerosis. There are chronic calcified right hilar lymph nodes. New patchy airspace disease in the right upper lobe. There are probable small bilateral pleural effusions, not significantly enlarged compared with the recent abdominal CT. No evidence of pneumothorax. No acute osseous findings are seen. There are embolization coils within the upper abdomen. IMPRESSION: 1. New patchy airspace disease in the right upper lobe suspicious for pneumonia. 2. Small bilateral pleural effusions, similar to recent abdominal CT. 3. Stable cardiomegaly. Electronically Signed   By: Elsie Perone M.D.   On:  12/16/2023 13:03     Procedures   Medications Ordered in the ED  lidocaine  (XYLOCAINE ) 1 % (with pres) injection 20 mL (10 mLs Infiltration Given 12/16/23 1443)    Clinical Course as of 12/16/23 1604  Tue Dec 16, 2023  1044 Patient with history of alcoholic cirrhosis, HFpEF presents with complaints of shortness of breath and abdominal distention.  He is hemodynamically stable.  Exam is notable for distended abdomen with mild lower abdominal tenderness, no peritoneal signs.  He has bibasilar Rales without wheezes.  Bilateral 1+ lower extremity edema.   Workup initiated in triage is notable for elevated creatinine that appears to be improving.  He has no leukocytosis, hemoglobin is stable, thrombocytopenia stable.  EKG notable for a flutter with T wave abnormalities unchanged.  Will add on troponins, BNP and chest x-ray and consult IR for paracentesis.  Last tap was 2 weeks ago, 3 L.  Had a recent admission and was discharged 5 days ago, offered paracentesis at that time.  Patient declined. [JT]  1304 Brain natriuretic peptide(!) Elevated but at baseline [JT]  1304 Protime-INR(!) Stable [JT]  1304 Workup so far reassuring.  Patient will go for paracentesis with IR.  Will likely need albumin  replenishment following paracentesis [JT]    Clinical Course User Index [JT] Donnajean Lynwood DEL, PA-C                                 Medical Decision Making Amount and/or Complexity of Data Reviewed Labs: ordered. Decision-making details documented in ED Course. Radiology: ordered.   This patient presents to the ED with chief complaint(s) of SOB and Abd distension.  The complaint involves an extensive differential diagnosis and also carries with it a high risk of complications and morbidity.   Pertinent past medical history as listed in HPI  The differential diagnosis includes  Considered ACS, however patient has  no EKG changes or elevated troponin.  Exam and history are concerning for CHF  exacerbation however BNP is at baseline, he does have small bilateral pleural effusions on his x-ray and what appears to be a new right upper lobe opacity.  Reassuringly he is afebrile without leukocytosis and no productive cough. Additional history obtained: Records reviewed Care Everywhere/External Records  Disposition:   Signout given to Tinnie Ewings, PA-C.  Please see her note for the remainder of the visit.  Disposition pending workup.  Social Determinants of Health:   none  This note was dictated with voice recognition software.  Despite best efforts at proofreading, errors may have occurred which can change the documentation meaning.       Final diagnoses:  Shortness of breath  Abdominal distension    ED Discharge Orders     None          Donnajean Lynwood VEAR DEVONNA 12/16/23 1604    Laurice Maude BROCKS, MD 12/17/23 0930

## 2023-12-17 ENCOUNTER — Other Ambulatory Visit (HOSPITAL_COMMUNITY): Payer: Self-pay | Admitting: Nurse Practitioner

## 2023-12-17 DIAGNOSIS — K7031 Alcoholic cirrhosis of liver with ascites: Secondary | ICD-10-CM

## 2023-12-19 ENCOUNTER — Other Ambulatory Visit (HOSPITAL_COMMUNITY): Payer: Self-pay

## 2023-12-23 ENCOUNTER — Other Ambulatory Visit: Payer: Self-pay

## 2023-12-23 ENCOUNTER — Emergency Department (HOSPITAL_COMMUNITY)
Admission: EM | Admit: 2023-12-23 | Discharge: 2023-12-23 | Disposition: A | Discharge status: Home / Self Care | Attending: Emergency Medicine | Admitting: Emergency Medicine

## 2023-12-23 ENCOUNTER — Encounter (HOSPITAL_COMMUNITY): Payer: Self-pay | Admitting: Emergency Medicine

## 2023-12-23 DIAGNOSIS — K7031 Alcoholic cirrhosis of liver with ascites: Secondary | ICD-10-CM | POA: Insufficient documentation

## 2023-12-23 DIAGNOSIS — R109 Unspecified abdominal pain: Secondary | ICD-10-CM | POA: Diagnosis present

## 2023-12-23 DIAGNOSIS — E785 Hyperlipidemia, unspecified: Secondary | ICD-10-CM | POA: Insufficient documentation

## 2023-12-23 DIAGNOSIS — I4892 Unspecified atrial flutter: Secondary | ICD-10-CM | POA: Insufficient documentation

## 2023-12-23 DIAGNOSIS — N183 Chronic kidney disease, stage 3 unspecified: Secondary | ICD-10-CM | POA: Insufficient documentation

## 2023-12-23 DIAGNOSIS — F1011 Alcohol abuse, in remission: Secondary | ICD-10-CM | POA: Insufficient documentation

## 2023-12-23 DIAGNOSIS — R188 Other ascites: Secondary | ICD-10-CM | POA: Diagnosis present

## 2023-12-23 DIAGNOSIS — Z8673 Personal history of transient ischemic attack (TIA), and cerebral infarction without residual deficits: Secondary | ICD-10-CM | POA: Insufficient documentation

## 2023-12-23 DIAGNOSIS — I8510 Secondary esophageal varices without bleeding: Secondary | ICD-10-CM | POA: Insufficient documentation

## 2023-12-23 DIAGNOSIS — I5032 Chronic diastolic (congestive) heart failure: Secondary | ICD-10-CM | POA: Insufficient documentation

## 2023-12-23 LAB — COMPREHENSIVE METABOLIC PANEL WITH GFR
ALT: 12 U/L (ref 0–44)
AST: 27 U/L (ref 15–41)
Albumin: 3.6 g/dL (ref 3.5–5.0)
Alkaline Phosphatase: 67 U/L (ref 38–126)
Anion gap: 11 (ref 5–15)
BUN: 18 mg/dL (ref 8–23)
CO2: 24 mmol/L (ref 22–32)
Calcium: 9.2 mg/dL (ref 8.9–10.3)
Chloride: 102 mmol/L (ref 98–111)
Creatinine, Ser: 1.64 mg/dL — ABNORMAL HIGH (ref 0.61–1.24)
GFR, Estimated: 46 mL/min — ABNORMAL LOW (ref >60)
Glucose, Bld: 94 mg/dL (ref 70–99)
Potassium: 4.4 mmol/L (ref 3.5–5.1)
Sodium: 137 mmol/L (ref 135–145)
Total Bilirubin: 1.3 mg/dL — ABNORMAL HIGH (ref 0.0–1.2)
Total Protein: 6.9 g/dL (ref 6.5–8.1)

## 2023-12-23 LAB — CBC
HCT: 32.5 % — ABNORMAL LOW (ref 39.0–52.0)
Hemoglobin: 10 g/dL — ABNORMAL LOW (ref 13.0–17.0)
MCH: 24.8 pg — ABNORMAL LOW (ref 26.0–34.0)
MCHC: 30.8 g/dL (ref 30.0–36.0)
MCV: 80.6 fL (ref 80.0–100.0)
Platelets: 108 K/uL — ABNORMAL LOW (ref 150–400)
RBC: 4.03 MIL/uL — ABNORMAL LOW (ref 4.22–5.81)
RDW: 17.9 % — ABNORMAL HIGH (ref 11.5–15.5)
WBC: 4.2 K/uL (ref 4.0–10.5)
nRBC: 0 % (ref 0.0–0.2)

## 2023-12-23 LAB — LIPASE, BLOOD: Lipase: 43 U/L (ref 11–51)

## 2023-12-23 LAB — PRO BRAIN NATRIURETIC PEPTIDE: Pro Brain Natriuretic Peptide: 3660 pg/mL — ABNORMAL HIGH (ref <300.0)

## 2023-12-23 MED ORDER — FUROSEMIDE 10 MG/ML IJ SOLN: 40.0000 mg | Freq: Once | INTRAMUSCULAR | Status: DC

## 2023-12-23 MED ORDER — FUROSEMIDE 40 MG PO TABS
40.0000 mg | ORAL_TABLET | Freq: Once | ORAL | Status: AC
  Administered 2023-12-23: 40 mg via ORAL
  Filled 2023-12-23: qty 1

## 2023-12-23 NOTE — ED Notes (Signed)
 [facility] has been called for patient due to not having his walker with him

## 2023-12-23 NOTE — ED Provider Notes (Signed)
 [place] EMERGENCY DEPARTMENT AT [facility] Provider Note   CSN: [id number] Arrival date & time: 12/23/23  1209     Patient presents with: Abdominal Pain   John [name] is a 68 y.o. male.    Abdominal Pain Associated symptoms: no chest pain, no constipation, no diarrhea, no fever, no nausea, no shortness of breath and no vomiting    68 year old male with history of alcoholic cirrhosis with recurrent ascites, history of esophageal varices, CKD 3, atrial flutter not on anticoagulation, HFpEF, history of CVA, HLD presenting due to abdominal pain and distention  Patient has had multiple ED visits and hospital admissions for paracentesis.  Most recently 8/19 visited the ED and had 6 L removed.  Received albumin  at that time as well.  Also prescribed cefpodoxime  and doxycycline  for suspected RUL pneumonia.  Per chart review patient was arranged for outpatient paracentesis with IR tomorrow. Pt states he was unaware of this appointment.  Per chart review relevant medications include Lasix  40 mg daily, lactulose  10 g twice daily as needed, spironolactone  25 mg daily, pt reports adherence with regimen. Has not used lactulose  recently, had 2 normal BM today  Currently denies pain, shortness of breath, recent fevers, nausea/vomiting/diarrhea.  Endorses abdominal pain due to tightness.  Denies significant leg swelling.  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  However does report he used cocaine yesterday.       Prior to Admission medications   Medication Sig Start Date End Date Taking? Authorizing Provider  albuterol  (PROAIR  HFA) 108 (90 Base) MCG/ACT inhaler Inhale 2 puffs into the lungs every 6 (six) hours as needed for wheezing or shortness of breath. 12/11/23   Rizwan, Saima, MD  allopurinol  (ZYLOPRIM ) 100 MG tablet Take 0.5 tablets (50 mg total) by mouth daily. 12/11/23 03/10/24  Rizwan, Saima, MD  atorvastatin  (LIPITOR) 40 MG tablet Take 1 tablet  (40 mg total) by mouth at bedtime. 12/11/23 03/10/24  Rizwan, Saima, MD  beclomethasone (QVAR  REDIHALER) 80 MCG/ACT inhaler Inhale 1 puff into the lungs 2 (two) times daily. 12/11/23   Rizwan, Saima, MD  brimonidine  (ALPHAGAN ) 0.2 % ophthalmic solution Place 1 drop into both eyes 2 (two) times daily. 12/11/23   Rizwan, Saima, MD  cefpodoxime  (VANTIN ) 200 MG tablet Take 1 tablet (200 mg total) by mouth 2 (two) times daily for 7 days. 12/16/23 12/23/23  Minnie Tinnie BRAVO, PA  FEROSUL 325 (65 Fe) MG tablet Take 1 tablet (325 mg total) by mouth every Monday, Wednesday, and Friday. 12/12/23 03/11/24  Rizwan, Saima, MD  folic acid  (FOLVITE ) 1 MG tablet Take 1 tablet (1 mg total) by mouth daily. 12/11/23 03/10/24  Rizwan, Saima, MD  furosemide  (LASIX ) 40 MG tablet Take 1 tablet (40 mg total) by mouth daily. 12/11/23 03/10/24  Rizwan, Saima, MD  hydroxypropyl methylcellulose / hypromellose (ISOPTO TEARS / GONIOVISC) 2.5 % ophthalmic solution Place 1 drop into both eyes 4 (four) times daily as needed for dry eyes. 12/11/23 01/10/24  Rizwan, Saima, MD  ketoconazole  (NIZORAL ) 2 % shampoo Apply topically to shampoo the scalp 2 times a week 12/11/23   Rizwan, Saima, MD  lactulose  (CHRONULAC ) 10 GM/15ML solution Take 15 mLs (10 g total) by mouth 2 (two) times daily as needed for mild constipation. 12/11/23   Rizwan, Saima, MD  melatonin 5 MG TABS Take 1 tablet (5 mg total) by mouth at bedtime. 12/11/23 03/10/24  Rizwan, Saima, MD  Menthol -Methyl Salicylate  (SALONPAS  PAIN RELIEF  PATCH) PTCH Apply 1  patch topically daily as needed (for mid-back pain and lower back pain). 12/11/23   Rizwan, Saima, MD  metoprolol  succinate (TOPROL  XL) 25 MG 24 hr tablet Take 1 tablet (25 mg total) by mouth at bedtime. 12/11/23 03/10/24  Rizwan, Saima, MD  omeprazole  (PRILOSEC) 40 MG capsule Take 1 capsule (40 mg total) by mouth daily before breakfast. 12/11/23 03/10/24  Earley Saucer, MD  potassium chloride  (KLOR-CON ) 10 MEQ tablet Take 1 tablet (10 mEq  total) by mouth daily. 12/11/23 03/10/24  Rizwan, Saima, MD  spironolactone  (ALDACTONE ) 50 MG tablet Take 0.5 tablets (25 mg total) by mouth daily for 60 doses. 12/11/23 02/09/24  Rizwan, Saima, MD    Allergies: Penicillins    Review of Systems  Constitutional:  Negative for fever.  Respiratory:  Negative for shortness of breath.   Cardiovascular:  Negative for chest pain.  Gastrointestinal:  Positive for abdominal distention and abdominal pain. Negative for blood in stool, constipation, diarrhea, nausea and vomiting.  Neurological:  Negative for weakness, light-headedness, numbness and headaches.    Updated Vital Signs BP 104/70 (BP Location: Left Arm)   Pulse 64   Temp 98.1 F (36.7 C) (Oral)   Resp 20   Ht 5' 7 (1.702 m)   Wt 99.8 kg   SpO2 97%   BMI 34.46 kg/m   Physical Exam Constitutional:      General: He is not in acute distress. Eyes:     General: No scleral icterus.    Extraocular Movements: Extraocular movements intact.     Pupils: Pupils are equal, round, and reactive to light.  Cardiovascular:     Rate and Rhythm: Normal rate. Rhythm irregular.     Heart sounds: No murmur heard. Pulmonary:     Effort: Pulmonary effort is normal. No respiratory distress.     Breath sounds: Normal breath sounds.  Abdominal:     General: Abdomen is protuberant. There is distension.     Palpations: Abdomen is soft.     Tenderness: There is generalized abdominal tenderness. There is no guarding or rebound.     Comments: Mild generalized tenderness to palpation  Neurological:     General: No focal deficit present.     Mental Status: He is alert.     Motor: No weakness.     (all labs ordered are listed, but only abnormal results are displayed) Labs Reviewed  COMPREHENSIVE METABOLIC PANEL WITH GFR - Abnormal; Notable for the following components:      Result Value   Creatinine, Ser 1.64 (*)    Total Bilirubin 1.3 (*)    GFR, Estimated 46 (*)    All other components within  normal limits  CBC - Abnormal; Notable for the following components:   RBC 4.03 (*)    Hemoglobin 10.0 (*)    HCT 32.5 (*)    MCH 24.8 (*)    RDW 17.9 (*)    Platelets 108 (*)    All other components within normal limits  PRO BRAIN NATRIURETIC PEPTIDE - Abnormal; Notable for the following components:   Pro Brain Natriuretic Peptide 3,660.0 (*)    All other components within normal limits  LIPASE, BLOOD  URINALYSIS, ROUTINE W REFLEX MICROSCOPIC  PROTIME-INR    EKG: EKG Interpretation Date/Time:  Tuesday December 23 2023 12:30:34 EDT Ventricular Rate:  64 PR Interval:    QRS Duration:  134 QT Interval:  424 QTC Calculation: 438 R Axis:   81  Text Interpretation: Atrial flutter with predominant 4:1 AV block  Nonspecific intraventricular conduction delay Nonspecific T abnormalities, lateral leads Artifact Confirmed by Garrick Charleston [phone]) on 12/23/2023 1:54:16 PM  Radiology: No results found.   Procedures   Medications Ordered in the ED  furosemide  (LASIX ) tablet 40 mg (40 mg Oral Given 12/23/23 1450)                                    Medical Decision Making 68 year old male with history of alcoholic cirrhosis with recurrent ascites, history of esophageal varices, CKD 3, atrial flutter not on anticoagulation, HFpEF, history of CVA, HLD presenting due to abdominal pain and distention. Has had multiple recurrent visits for similar presentation requiring recurrent paracentesis Was recently arranged for outpatient paracenteses with IR, starting tomorrow at 1pm though pt was unaware of this Today clinically appears well aside from his abdominal distension, no peritoneal signs on exam, afebrile and hemodynamically stable on room air. No evidence to suggest SBP or encephalopathy EKG shows a flutter without evidence of acute ischemia, stable from prior, low suspicion for atypical ACS  proBNP is elevated to 3600, 85mo ago was elevated to 2800 He is on lasix  at home and does not have  evidence of pulmonary edema on exam, suspect this is 2/2 his ascites  Ordered 40mg  PO lasix  to help reduce fluid burden  CBC, CMP are stable compared to prior. Stably elevated Cr in setting of CKD. Stable normocytic anemia likely 2/2 chronic disease. Stable thrombocytopenia.  Discussed with patient and he feels comfortable to attend his scheduled outpatient paracentesis tomorrow with IR. Continue home medications including lasix . With stable labs and clinical status I feel this is appropriate. Would recommend cardiology f/u to discuss lasix  dosing.  Discussed that I strongly recommend he avoid cocaine use especially because of his comorbidities and he is also on metoprolol    Stable for discharge with outpatient paracentesis tomorrow, discussed return precautions  Amount and/or Complexity of Data Reviewed Labs: ordered.  Risk Prescription drug management.       Final diagnoses:  Ascites due to alcoholic cirrhosis [facility])    ED Discharge Orders     None          Romelle Booty, MD 12/23/23 1452    Garrick Charleston, MD 12/25/23 [phone]

## 2023-12-23 NOTE — ED Notes (Signed)
 Asked patient if he could provide a urine sample, he said not until he gets something to drink.

## 2023-12-23 NOTE — ED Triage Notes (Signed)
 68 y/o male comes in c/o abdominal pain and distention reporting  I come here once a week to get my stomach drained. Pt reports a hx a ascites and CHF. Pts abdomen is distended and firm to the touch. PT denies any sob or chest pain.

## 2023-12-23 NOTE — Discharge Instructions (Addendum)
 You received medicine to help take off some fluid today.  Your paracentesis is scheduled for tomorrow (12/24/2023) at 1:00PM at [facility]. Please be on time for this appointment. They have also scheduled future procedures.   Continue taking your lasix  at home as prescribed. You should see your cardiologist soon to discuss the dosing for this.  Please return to the ER if you experience any worsening symptoms including fevers, numbness or weakness in your arms/legs, confusion, difficulty breathing, chest pain, severe abdominal pain, vomiting, or blood in your stool.

## 2023-12-24 ENCOUNTER — Ambulatory Visit (HOSPITAL_COMMUNITY)
Admission: RE | Admit: 2023-12-24 | Discharge: 2023-12-24 | Disposition: A | Discharge status: Home / Self Care | Source: Ambulatory Visit | Attending: Nurse Practitioner | Admitting: Nurse Practitioner

## 2023-12-24 DIAGNOSIS — K7031 Alcoholic cirrhosis of liver with ascites: Secondary | ICD-10-CM | POA: Insufficient documentation

## 2023-12-24 DIAGNOSIS — B1920 Unspecified viral hepatitis C without hepatic coma: Secondary | ICD-10-CM | POA: Insufficient documentation

## 2023-12-24 DIAGNOSIS — N189 Chronic kidney disease, unspecified: Secondary | ICD-10-CM | POA: Insufficient documentation

## 2023-12-24 HISTORY — PX: IR PARACENTESIS: IMG2679

## 2023-12-24 MED ORDER — LIDOCAINE-EPINEPHRINE 1 %-1:100000 IJ SOLN
20.0000 mL | Freq: Once | INTRAMUSCULAR | Status: AC
  Administered 2023-12-24: 10 mL via INTRADERMAL

## 2023-12-24 MED ORDER — LIDOCAINE-EPINEPHRINE 1 %-1:100000 IJ SOLN
INTRAMUSCULAR | Status: AC
Start: 2023-12-24 — End: 2023-12-24
  Filled 2023-12-24: qty 1

## 2023-12-24 NOTE — Procedures (Signed)
 PROCEDURE SUMMARY:  Successful ultrasound guided paracentesis from the right lower quadrant.  Yielded 5.5 liters of slightly hazy yellow fluid.  No immediate complications.  The patient tolerated the procedure well.   Specimen not sent for labs.  EBL < 5mL  The patient has previously been formally evaluated by the [facility] and is being actively followed for potential future intervention.  Tasean Mancha, PA-C

## 2023-12-26 ENCOUNTER — Other Ambulatory Visit (HOSPITAL_COMMUNITY): Payer: Self-pay

## 2023-12-30 ENCOUNTER — Encounter (HOSPITAL_COMMUNITY): Payer: Self-pay | Admitting: *Deleted

## 2023-12-31 ENCOUNTER — Ambulatory Visit (HOSPITAL_COMMUNITY)
Admission: RE | Admit: 2023-12-31 | Discharge: 2023-12-31 | Disposition: A | Discharge status: Home / Self Care | Payer: Medicare (Managed Care) | Source: Ambulatory Visit | Attending: Nurse Practitioner | Admitting: Nurse Practitioner

## 2023-12-31 DIAGNOSIS — N189 Chronic kidney disease, unspecified: Secondary | ICD-10-CM | POA: Diagnosis not present

## 2023-12-31 DIAGNOSIS — K7031 Alcoholic cirrhosis of liver with ascites: Secondary | ICD-10-CM | POA: Diagnosis present

## 2023-12-31 DIAGNOSIS — B1920 Unspecified viral hepatitis C without hepatic coma: Secondary | ICD-10-CM | POA: Insufficient documentation

## 2023-12-31 HISTORY — PX: IR PARACENTESIS: IMG2679

## 2023-12-31 MED ORDER — LIDOCAINE-EPINEPHRINE 1 %-1:100000 IJ SOLN
INTRAMUSCULAR | Status: AC
  Filled 2023-12-31: qty 1

## 2023-12-31 NOTE — Procedures (Signed)
 PROCEDURE SUMMARY:  Successful image-guided paracentesis from the right lateral abdomen.  Yielded 5.8 liters of yellow fluid.  No immediate complications.  EBL: trace Patient tolerated well.   Specimen not sent for labs.  Please see imaging section of Epic for full dictation.  Kimble DEL Arisbel Maione PA-C 12/31/2023 12:30 PM

## 2024-01-07 ENCOUNTER — Other Ambulatory Visit (HOSPITAL_COMMUNITY): Payer: Self-pay | Admitting: Nurse Practitioner

## 2024-01-07 ENCOUNTER — Encounter (HOSPITAL_COMMUNITY): Payer: Self-pay | Admitting: *Deleted

## 2024-01-07 ENCOUNTER — Ambulatory Visit (HOSPITAL_COMMUNITY)
Admission: RE | Admit: 2024-01-07 | Discharge: 2024-01-07 | Disposition: A | Discharge status: Home / Self Care | Payer: Medicare (Managed Care) | Source: Ambulatory Visit | Attending: Nurse Practitioner | Admitting: Nurse Practitioner

## 2024-01-07 DIAGNOSIS — K7031 Alcoholic cirrhosis of liver with ascites: Secondary | ICD-10-CM

## 2024-01-07 MED ORDER — LIDOCAINE-EPINEPHRINE 1 %-1:100000 IJ SOLN
INTRAMUSCULAR | Status: AC
  Filled 2024-01-07: qty 1

## 2024-01-07 MED ORDER — LIDOCAINE-EPINEPHRINE 1 %-1:100000 IJ SOLN: 10.0000 mL | Freq: Once | INTRAMUSCULAR | Status: DC

## 2024-01-07 NOTE — Progress Notes (Signed)
 Patient presents for  therapeutic and diagnostic paracentesis. US  limited abdomen shows small  amount of peritoneal fluid. After discussion of the risks versus the benefits of the procedure the Patient elected to defer the paracentesis at this time.  Procedure not performed.

## 2024-01-14 ENCOUNTER — Ambulatory Visit (HOSPITAL_COMMUNITY)
Admission: RE | Admit: 2024-01-14 | Discharge: 2024-01-14 | Disposition: A | Discharge status: Home / Self Care | Payer: Medicare (Managed Care) | Source: Ambulatory Visit | Attending: Nurse Practitioner | Admitting: Nurse Practitioner

## 2024-01-14 ENCOUNTER — Encounter (HOSPITAL_COMMUNITY): Payer: Self-pay | Admitting: *Deleted

## 2024-01-14 DIAGNOSIS — K7031 Alcoholic cirrhosis of liver with ascites: Secondary | ICD-10-CM | POA: Diagnosis present

## 2024-01-14 HISTORY — PX: IR PARACENTESIS: IMG2679

## 2024-01-14 MED ORDER — LIDOCAINE-EPINEPHRINE 1 %-1:100000 IJ SOLN
INTRAMUSCULAR | Status: AC
Start: 2024-01-14 — End: 2024-01-14
  Filled 2024-01-14: qty 1

## 2024-01-14 NOTE — Procedures (Signed)
 Ultrasound-guided therapeutic paracentesis performed yielding 6.3 liters of pal yellow colored fluid.   EBL is none.

## 2024-01-16 ENCOUNTER — Encounter (HOSPITAL_COMMUNITY): Payer: Self-pay

## 2024-01-16 ENCOUNTER — Encounter (HOSPITAL_COMMUNITY): Payer: Self-pay | Admitting: *Deleted

## 2024-01-16 ENCOUNTER — Emergency Department (HOSPITAL_COMMUNITY)
Admission: EM | Admit: 2024-01-16 | Discharge: 2024-01-16 | Disposition: A | Discharge status: Home / Self Care | Payer: Medicare (Managed Care) | Attending: Emergency Medicine | Admitting: Emergency Medicine

## 2024-01-16 DIAGNOSIS — Z4801 Encounter for change or removal of surgical wound dressing: Secondary | ICD-10-CM | POA: Insufficient documentation

## 2024-01-16 DIAGNOSIS — Z5189 Encounter for other specified aftercare: Secondary | ICD-10-CM

## 2024-01-16 NOTE — ED Triage Notes (Addendum)
 Per ems pt had paracenteses yesterday and since then it hasn't stopped leaking.  Pt states he is in no pain. No new swelling. Small incision site noted to right side of abdomen with dot of fluid. Swelling and weeping to bilateral legs.   BP 106/70 HR 100  98% room air

## 2024-01-16 NOTE — Discharge Instructions (Signed)
 Return for any problem.  ?

## 2024-01-16 NOTE — ED Provider Notes (Signed)
 [place] EMERGENCY DEPARTMENT AT [facility] Provider Note   CSN: [id number] Arrival date & time: 01/16/24  1436     Patient presents with: Drainage from Incision   John [name] is a 68 y.o. male.   68 year old male with prior medical history as detailed below.  Patient reports paracentesis performed Wednesday.  Patient reports minimal clear drainage from the paracentesis site.  He denies other complaint.  He denies abdominal pain.  Denies fever.  Denies nausea or vomiting.  He is requesting a dressing to be placed on his paracentesis site.  The history is provided by the patient and medical records.       Prior to Admission medications   Medication Sig Start Date End Date Taking? Authorizing Provider  albuterol  (PROAIR  HFA) 108 (90 Base) MCG/ACT inhaler Inhale 2 puffs into the lungs every 6 (six) hours as needed for wheezing or shortness of breath. 12/11/23   Rizwan, Saima, MD  allopurinol  (ZYLOPRIM ) 100 MG tablet Take 0.5 tablets (50 mg total) by mouth daily. 12/11/23 03/10/24  Rizwan, Saima, MD  atorvastatin  (LIPITOR) 40 MG tablet Take 1 tablet (40 mg total) by mouth at bedtime. 12/11/23 03/10/24  Rizwan, Saima, MD  beclomethasone (QVAR  REDIHALER) 80 MCG/ACT inhaler Inhale 1 puff into the lungs 2 (two) times daily. 12/11/23   Rizwan, Saima, MD  brimonidine  (ALPHAGAN ) 0.2 % ophthalmic solution Place 1 drop into both eyes 2 (two) times daily. 12/11/23   Rizwan, Saima, MD  FEROSUL 325 (65 Fe) MG tablet Take 1 tablet (325 mg total) by mouth every Monday, Wednesday, and Friday. 12/12/23 03/11/24  Rizwan, Saima, MD  folic acid  (FOLVITE ) 1 MG tablet Take 1 tablet (1 mg total) by mouth daily. 12/11/23 03/10/24  Rizwan, Saima, MD  furosemide  (LASIX ) 40 MG tablet Take 1 tablet (40 mg total) by mouth daily. 12/11/23 03/10/24  Rizwan, Saima, MD  ketoconazole  (NIZORAL ) 2 % shampoo Apply topically to shampoo the scalp 2 times a week 12/11/23   Rizwan, Saima, MD  lactulose   (CHRONULAC ) 10 GM/15ML solution Take 15 mLs (10 g total) by mouth 2 (two) times daily as needed for mild constipation. 12/11/23   Rizwan, Saima, MD  melatonin 5 MG TABS Take 1 tablet (5 mg total) by mouth at bedtime. 12/11/23 03/10/24  Rizwan, Saima, MD  Menthol -Methyl Salicylate  (SALONPAS  PAIN RELIEF  PATCH) PTCH Apply 1 patch topically daily as needed (for mid-back pain and lower back pain). 12/11/23   Rizwan, Saima, MD  metoprolol  succinate (TOPROL  XL) 25 MG 24 hr tablet Take 1 tablet (25 mg total) by mouth at bedtime. 12/11/23 03/10/24  Rizwan, Saima, MD  omeprazole  (PRILOSEC) 40 MG capsule Take 1 capsule (40 mg total) by mouth daily before breakfast. 12/11/23 03/10/24  Rizwan, Saima, MD  potassium chloride  (KLOR-CON ) 10 MEQ tablet Take 1 tablet (10 mEq total) by mouth daily. 12/11/23 03/10/24  Rizwan, Saima, MD  spironolactone  (ALDACTONE ) 50 MG tablet Take 0.5 tablets (25 mg total) by mouth daily for 60 doses. 12/11/23 02/09/24  Rizwan, Saima, MD    Allergies: Penicillins    Review of Systems  All other systems reviewed and are negative.   Updated Vital Signs BP 115/85 (BP Location: Left Arm)   Pulse 89   Temp 97.6 F (36.4 C) (Oral)   Resp 18   SpO2 99%   Physical Exam Vitals and nursing note reviewed.  Constitutional:      General: He is not in acute distress.    Appearance: Normal appearance. He is well-developed.  HENT:     Head: Normocephalic and atraumatic.  Eyes:     Conjunctiva/sclera: Conjunctivae normal.     Pupils: Pupils are equal, round, and reactive to light.  Cardiovascular:     Rate and Rhythm: Normal rate and regular rhythm.     Heart sounds: Normal heart sounds.  Pulmonary:     Effort: Pulmonary effort is normal. No respiratory distress.     Breath sounds: Normal breath sounds.  Abdominal:     General: There is no distension.     Palpations: Abdomen is soft.     Tenderness: There is no abdominal tenderness.     Comments: Ascites present.  Small puncture wound  to the right abdomen with minimal clear drainage consistent with leaking ascites.  Dressing applied.  No surrounding erythema.  No tenderness to the abdomen.  Patient is otherwise comfortable.  Musculoskeletal:        General: No deformity. Normal range of motion.     Cervical back: Normal range of motion and neck supple.  Skin:    General: Skin is warm and dry.  Neurological:     General: No focal deficit present.     Mental Status: He is alert and oriented to person, place, and time.     (all labs ordered are listed, but only abnormal results are displayed) Labs Reviewed - No data to display  EKG: None  Radiology: No results found.   Procedures   Medications Ordered in the ED - No data to display                                  Medical Decision Making Patient with minimal ascites draining from recent paracentesis site.  Patient without evidence of systemic illness or SBP.  Patient requested dressing to site.  Dressing applied.  Patient is appropriate for discharge.  Importance of close follow-up stressed.  Strict return precautions given and understood.        Final diagnoses:  Visit for wound check    ED Discharge Orders     None          Laurice Maude BROCKS, MD 01/16/24 [phone]

## 2024-01-21 ENCOUNTER — Inpatient Hospital Stay (HOSPITAL_COMMUNITY): Admission: RE | Admit: 2024-01-21 | Source: Ambulatory Visit

## 2024-01-22 ENCOUNTER — Ambulatory Visit (HOSPITAL_COMMUNITY)
Admission: RE | Admit: 2024-01-22 | Discharge: 2024-01-22 | Disposition: A | Discharge status: Home / Self Care | Payer: Medicare (Managed Care) | Source: Ambulatory Visit | Attending: Nurse Practitioner | Admitting: Nurse Practitioner

## 2024-01-22 DIAGNOSIS — K7031 Alcoholic cirrhosis of liver with ascites: Secondary | ICD-10-CM | POA: Diagnosis present

## 2024-01-22 HISTORY — PX: IR PARACENTESIS: IMG2679

## 2024-01-22 MED ORDER — LIDOCAINE-EPINEPHRINE 1 %-1:100000 IJ SOLN
INTRAMUSCULAR | Status: AC
  Filled 2024-01-22: qty 1

## 2024-01-22 MED ORDER — LIDOCAINE-EPINEPHRINE 1 %-1:100000 IJ SOLN
20.0000 mL | Freq: Once | INTRAMUSCULAR | Status: AC
  Administered 2024-01-22: 20 mL

## 2024-01-22 NOTE — Procedures (Signed)
 PROCEDURE SUMMARY:  Successful ultrasound guided paracentesis from the right lower quadrant.  Yielded 7.5 L of clear yellow fluid.  No immediate complications.  The patient tolerated the procedure well.   Specimen not sent for labs.  EBL < 2 mL  The patient has required >/=2 paracenteses in a 30 day period and a screening evaluation by the [facility] Inter[facility] Radiology [facility]pertension [facility] has been arranged.  Bart Ashford, AGACNP-BC 01/22/2024, 2:42 PM

## 2024-01-27 ENCOUNTER — Other Ambulatory Visit: Payer: Self-pay | Admitting: Nurse Practitioner

## 2024-01-27 DIAGNOSIS — K766 Portal hypertension: Secondary | ICD-10-CM

## 2024-01-28 ENCOUNTER — Inpatient Hospital Stay (HOSPITAL_COMMUNITY): Admission: RE | Admit: 2024-01-28 | Source: Ambulatory Visit

## 2024-02-02 ENCOUNTER — Encounter (HOSPITAL_COMMUNITY): Payer: Self-pay | Admitting: Nurse Practitioner

## 2024-02-02 ENCOUNTER — Encounter (HOSPITAL_COMMUNITY): Payer: Self-pay | Admitting: *Deleted

## 2024-02-02 DIAGNOSIS — K7031 Alcoholic cirrhosis of liver with ascites: Secondary | ICD-10-CM

## 2024-02-03 ENCOUNTER — Other Ambulatory Visit (HOSPITAL_COMMUNITY): Payer: Self-pay | Admitting: Nurse Practitioner

## 2024-02-03 ENCOUNTER — Ambulatory Visit (HOSPITAL_COMMUNITY)
Admission: RE | Admit: 2024-02-03 | Discharge: 2024-02-03 | Disposition: A | Discharge status: Home / Self Care | Payer: Medicare (Managed Care) | Source: Ambulatory Visit | Attending: Nurse Practitioner | Admitting: Nurse Practitioner

## 2024-02-03 ENCOUNTER — Encounter (HOSPITAL_COMMUNITY): Payer: Self-pay | Admitting: Nurse Practitioner

## 2024-02-03 DIAGNOSIS — K7031 Alcoholic cirrhosis of liver with ascites: Secondary | ICD-10-CM

## 2024-02-03 HISTORY — PX: IR PARACENTESIS: IMG2679

## 2024-02-03 MED ORDER — LIDOCAINE HCL 1 % IJ SOLN
INTRAMUSCULAR | Status: AC
  Filled 2024-02-03: qty 20

## 2024-02-03 NOTE — Procedures (Signed)
 PROCEDURE SUMMARY:  Successful image-guided paracentesis from the right abdomen.  Yielded 6.0 liters of clear, raw-colored peritoneal fluid.  No immediate complications.  EBL: zero Patient tolerated well.   Please see imaging section of Epic for full dictation.  Carlin LABOR Ariona Deschene PA-C 02/03/2024 11:56 AM

## 2024-02-09 ENCOUNTER — Encounter (HOSPITAL_COMMUNITY): Payer: Self-pay | Admitting: *Deleted

## 2024-02-13 ENCOUNTER — Ambulatory Visit (HOSPITAL_COMMUNITY)
Admission: RE | Admit: 2024-02-13 | Discharge: 2024-02-13 | Disposition: A | Discharge status: Home / Self Care | Source: Ambulatory Visit | Attending: Nurse Practitioner | Admitting: Nurse Practitioner

## 2024-02-13 DIAGNOSIS — K7031 Alcoholic cirrhosis of liver with ascites: Secondary | ICD-10-CM | POA: Diagnosis present

## 2024-02-13 HISTORY — PX: IR PARACENTESIS: IMG2679

## 2024-02-13 MED ORDER — LIDOCAINE-EPINEPHRINE 1 %-1:100000 IJ SOLN
INTRAMUSCULAR | Status: AC
  Filled 2024-02-13: qty 1

## 2024-02-15 NOTE — Progress Notes (Signed)
 Chief Complaint: Patient was seen in consultation today for portal hypertension with recurrent ascites.   Referring Physician(s): Farrar,Candace  History of Present Illness: John [name] is a 68 y.o. male with history of polysubstance abuse, alcoholic cirrhosis, esophageal varices, recurrent ascites, CKD III, atrial flutter, HFpEF known to Interventional Radiology from prior left renal cryoablation with Dr. Vanice in July 2018 with re-ablation in 07/2020 due to local, regional recurrence with stable follow-up and no evidence of new disease since this time. More recently, secondary to his cirrhosis, he has developed recurrent, large-volume ascites requiring frequent paracenteses for which despite having regularly scheduled outpatient appointments, he also frequently visits the ED to accomplish.  He was diagnose with Grade 1 esophageal varices by EGD 06/2020 and fortunately has not known variceal bleeding.  He is referred to Interventional Radiology today for ongoing evaluation and management of his portal hypertension associated with recurrent, large-volume ascites and to determine candidacy for TIPS procedure.    The patient has been referred to Interventional Radiology to discuss possible treatment/management options for his cirrhosis with recurrent ascites. He is currently requiring a paracentesis every 7-10 days with 6-10 liters removed each time. His most recent ECHO was performed 05/10/23 and this showed moderate left atrial enlargement, but preserved left ventricular function with EF 55-60%.   Past Medical History:  Diagnosis Date   Allergy    Arthritis    oa back   Asthma    CAP (community acquired pneumonia) 05/23/2015   Cataract    both eyes   CHF (congestive heart failure) (HCC) 2001   sees primary for   Chronic back pain    Chronic kidney disease    ckd 3   Cirrhosis (HCC)    alcoholic with h/o varices, ascites   Coronary artery disease    nonobstrucrtibe   ETOH  abuse    GERD (gastroesophageal reflux disease)    Gout    Hepatitis    hepatitic c 2018 24 week tx with ecuplipsa, no hepatitic c detected after tx   History of blood transfusion 8-9- yrs ago   Hypertension    Optic neuropathy, left    PAD (peripheral artery disease)    slight  primary manages   Polysubstance abuse (HCC)    Primary hypertension    Renal cell carcinoma (HCC) 2016, 2018 and 2022   left ablation   Seizures [facility])    age 31 none since   Sickle cell trait    Stroke (HCC) 1998   light no problems with   Uses walker 12/04/2020   Wears dentures    Wears glasses     Past Surgical History:  Procedure Laterality Date   ARTHRODESIS METATARSALPHALANGEAL JOINT (MTPJ) Right 12/07/2020   Procedure: ARTHRODESIS METATARSALPHALANGEAL JOINT (MTPJ);  Surgeon: Janit Thresa HERO, DPM;  Location: [facility];  Service: Podiatry;  Laterality: Right;   COLONOSCOPY  2021   ESOPHAGOGASTRODUODENOSCOPY N/A 08/14/2014   Procedure: ESOPHAGOGASTRODUODENOSCOPY (EGD);  Surgeon: Gwendlyn ONEIDA Buddy, MD;  Location: [facility];  Service: Endoscopy;  Laterality: N/A;   ESOPHAGOGASTRODUODENOSCOPY (EGD) WITH PROPOFOL  N/A 06/03/2017   Procedure: ESOPHAGOGASTRODUODENOSCOPY (EGD) WITH PROPOFOL ;  Surgeon: Buddy Gwendlyn ONEIDA, MD;  Location: [facility];  Service: Endoscopy;  Laterality: N/A;   HALLUX FUSION Left 07/28/2020   Procedure: HALLUX FUSION MPJ;  Surgeon: Janit Thresa HERO, DPM;  Location: [facility] [place];  Service: Podiatry;  Laterality: Left;   HAMMER TOE SURGERY Left 07/28/2020   Procedure: HAMMER TOE CORRECTION  2,3,AND4 LEFT  FOOT;  Surgeon: Janit Thresa HERO, DPM;  Location: [facility];  Service: Podiatry;  Laterality: Left;   HAMMER TOE SURGERY Right 12/07/2020   Procedure: HAMMER TOE CORRECTION  2-4;  Surgeon: Janit Thresa HERO, DPM;  Location: [facility];  Service: Podiatry;  Laterality: Right;   IR PARACENTESIS  09/05/2023   IR PARACENTESIS   09/16/2023   IR PARACENTESIS  12/16/2023   IR PARACENTESIS  12/24/2023   IR PARACENTESIS  12/31/2023   IR PARACENTESIS  01/14/2024   IR PARACENTESIS  01/22/2024   IR PARACENTESIS  02/03/2024   IR RADIOLOGIST EVAL & MGMT  09/25/2016   IR RADIOLOGIST EVAL & MGMT  12/10/2016   IR RADIOLOGIST EVAL & MGMT  03/25/2018   IR RADIOLOGIST EVAL & MGMT  03/24/2019   IR RADIOLOGIST EVAL & MGMT  05/17/2020   IR RADIOLOGIST EVAL & MGMT  08/03/2020   IR RADIOLOGIST EVAL & MGMT  11/22/2020   IR RADIOLOGIST EVAL & MGMT  05/31/2021   METATARSAL HEAD EXCISION Left 07/28/2020   Procedure: METATARSAL HEAD EXCISION TOES 2,3,AND 4  LEFT FOOT;  Surgeon: Janit Thresa HERO, DPM;  Location: [facility];  Service: Podiatry;  Laterality: Left;   METATARSAL OSTEOTOMY Right 12/07/2020   Procedure: METATARSAL OSTEOTOMY TOES 2-4 RIGHT FOOT;  Surgeon: Janit Thresa HERO, DPM;  Location: [facility];  Service: Podiatry;  Laterality: Right;   none     RADIOFREQUENCY ABLATION Left 11/08/2016   Procedure: LEFT RENAL CRYOABLATION;  Surgeon: Vanice Sharper, MD;  Location: [facility];  Service: Anesthesiology;  Laterality: Left;   RADIOLOGY WITH ANESTHESIA N/A 08/15/2014   Procedure: RADIOLOGY WITH ANESTHESIA;  Surgeon: Sharper Vanice, MD;  Location: [facility];  Service: Radiology;  Laterality: N/A;   RADIOLOGY WITH ANESTHESIA Left 07/05/2020   Procedure: CT CRYOABLATION;  Surgeon: Vanice Sharper, MD;  Location: [facility];  Service: Anesthesiology;  Laterality: Left;   UPPER GASTROINTESTINAL ENDOSCOPY      Allergies: Penicillins  Medications: Prior to Admission medications   Medication Sig Start Date End Date Taking? Authorizing Provider  albuterol  (PROAIR  HFA) 108 (90 Base) MCG/ACT inhaler Inhale 2 puffs into the lungs every 6 (six) hours as needed for wheezing or shortness of breath. 12/11/23   Rizwan, Saima, MD  allopurinol  (ZYLOPRIM ) 100 MG tablet Take 0.5 tablets (50 mg total) by mouth daily. 12/11/23 03/10/24  Rizwan,  Saima, MD  atorvastatin  (LIPITOR) 40 MG tablet Take 1 tablet (40 mg total) by mouth at bedtime. 12/11/23 03/10/24  Rizwan, Saima, MD  beclomethasone (QVAR  REDIHALER) 80 MCG/ACT inhaler Inhale 1 puff into the lungs 2 (two) times daily. 12/11/23   Rizwan, Saima, MD  brimonidine  (ALPHAGAN ) 0.2 % ophthalmic solution Place 1 drop into both eyes 2 (two) times daily. 12/11/23   Rizwan, Saima, MD  FEROSUL 325 (65 Fe) MG tablet Take 1 tablet (325 mg total) by mouth every Monday, Wednesday, and Friday. 12/12/23 03/11/24  Rizwan, Saima, MD  folic acid  (FOLVITE ) 1 MG tablet Take 1 tablet (1 mg total) by mouth daily. 12/11/23 03/10/24  Rizwan, Saima, MD  furosemide  (LASIX ) 40 MG tablet Take 1 tablet (40 mg total) by mouth daily. 12/11/23 03/10/24  Rizwan, Saima, MD  ketoconazole  (NIZORAL ) 2 % shampoo Apply topically to shampoo the scalp 2 times a week 12/11/23   Rizwan, Saima, MD  lactulose  (CHRONULAC ) 10 GM/15ML solution Take 15 mLs (10 g total) by mouth 2 (two) times daily as needed for mild constipation. 12/11/23   Rizwan,  Saima, MD  melatonin 5 MG TABS Take 1 tablet (5 mg total) by mouth at bedtime. 12/11/23 03/10/24  Rizwan, Saima, MD  Menthol -Methyl Salicylate  (SALONPAS  PAIN RELIEF  PATCH) PTCH Apply 1 patch topically daily as needed (for mid-back pain and lower back pain). 12/11/23   Rizwan, Saima, MD  metoprolol  succinate (TOPROL  XL) 25 MG 24 hr tablet Take 1 tablet (25 mg total) by mouth at bedtime. 12/11/23 03/10/24  Rizwan, Saima, MD  omeprazole  (PRILOSEC) 40 MG capsule Take 1 capsule (40 mg total) by mouth daily before breakfast. 12/11/23 03/10/24  Rizwan, Saima, MD  potassium chloride  (KLOR-CON ) 10 MEQ tablet Take 1 tablet (10 mEq total) by mouth daily. 12/11/23 03/10/24  Rizwan, Saima, MD  spironolactone  (ALDACTONE ) 50 MG tablet Take 0.5 tablets (25 mg total) by mouth daily for 60 doses. 12/11/23 02/09/24  Earley Saucer, MD     Family History  Problem Relation Age of Onset   Hypertension Mother        Living    Kidney disease Mother    Diabetes Mother    Heart disease Mother    Hypertension Father        Deceased, 26   Ulcers Father    Stomach cancer Father    Hypertension Brother    Diabetes Brother    Kidney disease Brother    Hypertension Sister    Colon cancer Neg Hx    Esophageal cancer Neg Hx    Rectal cancer Neg Hx     Social History   Socioeconomic History   Marital status: Single    Spouse name: Not on file   Number of children: 0   Years of education: Not on file   Highest education level: Not on file  Occupational History   Occupation: retired  Tobacco Use   Smoking status: Former    Current packs/day: 0.00    Average packs/day: 0.3 packs/day for 20.0 years (6.0 ttl pk-yrs)    Types: Cigarettes    Start date: 04/30/1991    Quit date: 04/30/2011    Years since quitting: 12.8   Smokeless tobacco: Never   Tobacco comments:    2015  Vaping Use   Vaping status: Never Used  Substance and Sexual Activity   Alcohol use: No    Comment: not in 11 years   Drug use: Yes    Types: Cocaine, Marijuana    Comment: cocaine last used 2024 and marijuan last used 2024   Sexual activity: Not Currently  Other Topics Concern   Not on file  Social History Narrative   Lives with niece in a 2 story home.     On disability since 2015 for low back pain.  Used to work Environmental manager.    Highest level of education: 11th grade   Social Drivers of Corporate investment banker Strain: Not on file  Food Insecurity: Food Insecurity Present (12/11/2023)   Hunger Vital Sign    Worried About Running Out of Food in the Last Year: Sometimes true    Ran Out of Food in the Last Year: Never true  Transportation Needs: No Transportation Needs (12/11/2023)   PRAPARE - Administrator, Civil Service (Medical): No    Lack of Transportation (Non-Medical): No  Physical Activity: Not on file  Stress: Not on file  Social Connections: Moderately Integrated (12/10/2023)   Social Connection  and Isolation Panel    Frequency of Communication with Friends and Family: Twice a week    Frequency  of Social Gatherings with Friends and Family: Once a week    Attends Religious Services: 1 to 4 times per year    Active Member of [employer] or Organizations: No    Attends Engineer, structural: 1 to 4 times per year    Marital Status: Never married    Review of Systems: A 12 point ROS discussed and pertinent positives are indicated in the HPI above.  All other systems are negative.  Review of Systems  Vital Signs: There were no vitals taken for this visit.  Advance Care Plan: The advanced care plan/surrogate decision maker was discussed at the time of visit and documented in the medical record.    Physical Exam  Imaging:   Labs:  CBC: Recent Labs    12/10/23 1549 12/11/23 0509 12/16/23 0904 12/23/23 1227  WBC 4.7 4.3 4.4 4.2  HGB 9.9* 8.9* 9.8* 10.0*  HCT 33.0* 29.3* 31.7* 32.5*  PLT 116* 117* 112* 108*    COAGS: Recent Labs    10/20/23 1605 11/12/23 1304 12/02/23 1527 12/16/23 1026  INR 1.3* 1.2 1.3* 1.3*  APTT  --   --   --  31    BMP: Recent Labs    12/10/23 1218 12/11/23 0509 12/16/23 0904 12/23/23 1227  NA 137 136 137 137  K 4.3 4.1 3.9 4.4  CL 105 104 106 102  CO2 21* 24 22 24   GLUCOSE 78 93 84 94  BUN 22 25* 20 18  CALCIUM  8.6* 8.3* 8.7* 9.2  CREATININE 1.85* 1.90* 1.46* 1.64*  GFRNONAA 39* 38* 52* 46*    LIVER FUNCTION TESTS: Recent Labs    12/05/23 0525 12/10/23 1218 12/16/23 0904 12/23/23 1227  BILITOT 0.9 1.0 1.2 1.3*  AST 17 22 20 27   ALT 8 12 11 12   ALKPHOS 58 58 63 67  PROT 6.5 6.9 7.0 6.9  ALBUMIN  3.1* 3.1* 3.1* 3.6    TUMOR MARKERS: No results for input(s): AFPTM, CEA, CA199, CHROMGRNA in the last 8760 hours.  Assessment and Plan:  68 year old male with a complicated past medical history including polysubstance abuse, alcoholic cirrhosis, esophageal varices, recurrent ascites, CKD III, atrial flutter,  HFpEF. He is currently requiring paracenteses every 7-10 days with 6-10 liters removed each visit.   MELD Na 15 Child-Pugh class B FIPS 0.55  Thank you for this interesting consult.  I greatly enjoyed meeting John [name] and look forward to participating in their care.  A copy of this report was sent to the requesting provider on this date.  Ester Sides, MD Pager: [phone]    I spent a total of  40 Minutes   in face to face in clinical consultation, greater than 50% of which was counseling/coordinating care for cirrhosis with portal hypertension.

## 2024-02-16 ENCOUNTER — Other Ambulatory Visit: Payer: Self-pay | Admitting: Interventional Radiology

## 2024-02-16 ENCOUNTER — Encounter (HOSPITAL_COMMUNITY): Payer: Self-pay | Admitting: *Deleted

## 2024-02-16 ENCOUNTER — Other Ambulatory Visit: Payer: Self-pay | Admitting: *Deleted

## 2024-02-16 ENCOUNTER — Ambulatory Visit
Admission: RE | Admit: 2024-02-16 | Discharge: 2024-02-16 | Disposition: A | Discharge status: Home / Self Care | Payer: Medicare (Managed Care) | Source: Ambulatory Visit | Attending: Nurse Practitioner | Admitting: Nurse Practitioner

## 2024-02-16 DIAGNOSIS — K766 Portal hypertension: Secondary | ICD-10-CM

## 2024-02-16 DIAGNOSIS — Z01810 Encounter for preprocedural cardiovascular examination: Secondary | ICD-10-CM

## 2024-02-16 DIAGNOSIS — R188 Other ascites: Secondary | ICD-10-CM

## 2024-02-16 HISTORY — PX: IR RADIOLOGIST EVAL & MGMT: IMG5224

## 2024-02-19 ENCOUNTER — Ambulatory Visit (HOSPITAL_COMMUNITY)
Admission: RE | Admit: 2024-02-19 | Discharge: 2024-02-19 | Disposition: A | Discharge status: Home / Self Care | Payer: Medicare (Managed Care) | Source: Ambulatory Visit | Attending: Interventional Radiology | Admitting: Interventional Radiology

## 2024-02-19 DIAGNOSIS — I499 Cardiac arrhythmia, unspecified: Secondary | ICD-10-CM | POA: Diagnosis not present

## 2024-02-19 DIAGNOSIS — K766 Portal hypertension: Secondary | ICD-10-CM

## 2024-02-19 DIAGNOSIS — E785 Hyperlipidemia, unspecified: Secondary | ICD-10-CM | POA: Diagnosis not present

## 2024-02-19 DIAGNOSIS — I509 Heart failure, unspecified: Secondary | ICD-10-CM | POA: Diagnosis not present

## 2024-02-19 DIAGNOSIS — R188 Other ascites: Secondary | ICD-10-CM | POA: Insufficient documentation

## 2024-02-19 DIAGNOSIS — I4891 Unspecified atrial fibrillation: Secondary | ICD-10-CM | POA: Insufficient documentation

## 2024-02-19 DIAGNOSIS — I081 Rheumatic disorders of both mitral and tricuspid valves: Secondary | ICD-10-CM | POA: Insufficient documentation

## 2024-02-19 DIAGNOSIS — J449 Chronic obstructive pulmonary disease, unspecified: Secondary | ICD-10-CM | POA: Insufficient documentation

## 2024-02-19 DIAGNOSIS — I110 Hypertensive heart disease with heart failure: Secondary | ICD-10-CM | POA: Diagnosis not present

## 2024-02-19 DIAGNOSIS — Z01810 Encounter for preprocedural cardiovascular examination: Secondary | ICD-10-CM | POA: Diagnosis present

## 2024-02-19 DIAGNOSIS — I77810 Thoracic aortic ectasia: Secondary | ICD-10-CM | POA: Insufficient documentation

## 2024-02-19 LAB — ECHOCARDIOGRAM COMPLETE
Calc EF: 50.9 %
S' Lateral: 3.2 cm
Single Plane A2C EF: 51.6 %
Single Plane A4C EF: 46.7 %

## 2024-02-20 ENCOUNTER — Ambulatory Visit (HOSPITAL_COMMUNITY)
Admission: RE | Admit: 2024-02-20 | Discharge: 2024-02-20 | Disposition: A | Discharge status: Home / Self Care | Payer: Medicare (Managed Care) | Source: Ambulatory Visit | Attending: Nurse Practitioner | Admitting: Nurse Practitioner

## 2024-02-20 DIAGNOSIS — K7031 Alcoholic cirrhosis of liver with ascites: Secondary | ICD-10-CM | POA: Insufficient documentation

## 2024-02-20 HISTORY — PX: IR PARACENTESIS: IMG2679

## 2024-02-20 MED ORDER — LIDOCAINE-EPINEPHRINE 1 %-1:100000 IJ SOLN
20.0000 mL | Freq: Once | INTRAMUSCULAR | Status: AC
  Administered 2024-02-20: 8 mL via INTRADERMAL

## 2024-02-20 MED ORDER — LIDOCAINE-EPINEPHRINE 1 %-1:100000 IJ SOLN
INTRAMUSCULAR | Status: AC
  Filled 2024-02-20: qty 1

## 2024-02-27 ENCOUNTER — Ambulatory Visit (HOSPITAL_COMMUNITY)
Admission: RE | Admit: 2024-02-27 | Discharge: 2024-02-27 | Disposition: A | Discharge status: Home / Self Care | Payer: Medicare (Managed Care) | Source: Ambulatory Visit | Attending: Nurse Practitioner | Admitting: Nurse Practitioner

## 2024-02-27 DIAGNOSIS — K7031 Alcoholic cirrhosis of liver with ascites: Secondary | ICD-10-CM | POA: Diagnosis present

## 2024-02-27 HISTORY — PX: IR PARACENTESIS: IMG2679

## 2024-02-27 MED ORDER — ALBUMIN HUMAN 25 % IV SOLN
29.0000 g | Freq: Once | INTRAVENOUS | Status: AC
  Administered 2024-02-27: 31.25 g via INTRAVENOUS

## 2024-02-27 MED ORDER — LIDOCAINE HCL 1 % IJ SOLN
INTRAMUSCULAR | Status: AC
  Filled 2024-02-27: qty 20

## 2024-02-27 MED ORDER — LIDOCAINE HCL 1 % IJ SOLN
20.0000 mL | Freq: Once | INTRAMUSCULAR | Status: AC
  Administered 2024-02-27: 20 mL

## 2024-02-27 MED ORDER — ALBUMIN HUMAN 25 % IV SOLN
INTRAVENOUS | Status: AC
  Filled 2024-02-27: qty 150

## 2024-02-27 NOTE — Procedures (Signed)
 PROCEDURE SUMMARY:  Successful image-guided paracentesis from the right abdomen.  Yielded 7.3 liters of clear yellow fluid.  No immediate complications.  EBL: zero Patient tolerated well.   Specimen not sent for labs.  Of note, patient was seen in the portal hypertension clinic on 02/16/2024 with Dr Jennefer for potential TIPS creation.   Please see imaging section of Epic for full dictation.    Kentley Blyden B Camren Henthorn NP 02/27/2024 2:58 PM

## 2024-03-01 ENCOUNTER — Encounter (HOSPITAL_COMMUNITY): Payer: Self-pay | Admitting: *Deleted

## 2024-03-05 ENCOUNTER — Other Ambulatory Visit: Payer: Self-pay

## 2024-03-05 ENCOUNTER — Ambulatory Visit (HOSPITAL_COMMUNITY)
Admission: RE | Admit: 2024-03-05 | Discharge: 2024-03-05 | Disposition: A | Discharge status: Home / Self Care | Payer: Medicare (Managed Care) | Source: Ambulatory Visit | Attending: Nurse Practitioner | Admitting: Nurse Practitioner

## 2024-03-05 DIAGNOSIS — K7031 Alcoholic cirrhosis of liver with ascites: Secondary | ICD-10-CM | POA: Insufficient documentation

## 2024-03-05 HISTORY — PX: IR PARACENTESIS: IMG2679

## 2024-03-05 MED ORDER — LIDOCAINE-EPINEPHRINE 1 %-1:100000 IJ SOLN
INTRAMUSCULAR | Status: AC
Start: 2024-03-05 — End: 2024-03-05
  Filled 2024-03-05: qty 1

## 2024-03-05 MED ORDER — ALBUMIN HUMAN 25 % IV SOLN
INTRAVENOUS | Status: AC
  Filled 2024-03-05: qty 100

## 2024-03-05 MED ORDER — LIDOCAINE-EPINEPHRINE 1 %-1:100000 IJ SOLN
30.0000 mL | Freq: Once | INTRAMUSCULAR | Status: AC
  Administered 2024-03-05: 10 mL

## 2024-03-05 MED ORDER — ALBUMIN HUMAN 25 % IV SOLN
25.0000 g | Freq: Once | INTRAVENOUS | Status: AC
  Administered 2024-03-05: 25 g via INTRAVENOUS

## 2024-03-05 NOTE — Procedures (Signed)
 PROCEDURE SUMMARY:  Successful image-guided paracentesis from the left lower abdomen.  Yielded 6 liters of hazy yellow fluid.  No immediate complications.  EBL: trace Patient tolerated well.   Specimen not sent for labs.  Please see imaging section of Epic for full dictation.  Kimble DEL Sanad Fearnow PA-C 03/05/2024 2:31 PM

## 2024-03-08 ENCOUNTER — Other Ambulatory Visit (HOSPITAL_COMMUNITY): Payer: Self-pay | Admitting: Nurse Practitioner

## 2024-03-08 DIAGNOSIS — K7031 Alcoholic cirrhosis of liver with ascites: Secondary | ICD-10-CM

## 2024-03-11 ENCOUNTER — Ambulatory Visit
Admission: RE | Admit: 2024-03-11 | Discharge: 2024-03-11 | Disposition: A | Discharge status: Home / Self Care | Payer: Medicare (Managed Care) | Source: Ambulatory Visit | Attending: Interventional Radiology | Admitting: Interventional Radiology

## 2024-03-11 DIAGNOSIS — Z01810 Encounter for preprocedural cardiovascular examination: Secondary | ICD-10-CM

## 2024-03-11 DIAGNOSIS — K766 Portal hypertension: Secondary | ICD-10-CM

## 2024-03-11 DIAGNOSIS — R188 Other ascites: Secondary | ICD-10-CM

## 2024-03-11 MED ORDER — IOPAMIDOL (ISOVUE-370) INJECTION 76%
75.0000 mL | Freq: Once | INTRAVENOUS | Status: AC | PRN
  Administered 2024-03-11: 75 mL via INTRAVENOUS

## 2024-03-12 ENCOUNTER — Ambulatory Visit (HOSPITAL_COMMUNITY)
Admission: RE | Admit: 2024-03-12 | Discharge: 2024-03-12 | Disposition: A | Discharge status: Home / Self Care | Payer: Medicare (Managed Care) | Source: Ambulatory Visit | Attending: Nurse Practitioner | Admitting: Nurse Practitioner

## 2024-03-12 DIAGNOSIS — K7031 Alcoholic cirrhosis of liver with ascites: Secondary | ICD-10-CM | POA: Insufficient documentation

## 2024-03-12 HISTORY — PX: IR PARACENTESIS: IMG2679

## 2024-03-12 LAB — CBC
HCT: 32.7 % — ABNORMAL LOW (ref 39.0–52.0)
Hemoglobin: 10.4 g/dL — ABNORMAL LOW (ref 13.0–17.0)
MCH: 25.3 pg — ABNORMAL LOW (ref 26.0–34.0)
MCHC: 31.8 g/dL (ref 30.0–36.0)
MCV: 79.6 fL — ABNORMAL LOW (ref 80.0–100.0)
Platelets: 154 K/uL (ref 150–400)
RBC: 4.11 MIL/uL — ABNORMAL LOW (ref 4.22–5.81)
RDW: 17.2 % — ABNORMAL HIGH (ref 11.5–15.5)
WBC: 5.1 K/uL (ref 4.0–10.5)
nRBC: 0 % (ref 0.0–0.2)

## 2024-03-12 LAB — COMPREHENSIVE METABOLIC PANEL WITH GFR
ALT: 18 U/L (ref 0–44)
AST: 39 U/L (ref 15–41)
Albumin: 2.8 g/dL — ABNORMAL LOW (ref 3.5–5.0)
Alkaline Phosphatase: 106 U/L (ref 38–126)
Anion gap: 9 (ref 5–15)
BUN: 16 mg/dL (ref 8–23)
CO2: 22 mmol/L (ref 22–32)
Calcium: 8.5 mg/dL — ABNORMAL LOW (ref 8.9–10.3)
Chloride: 107 mmol/L (ref 98–111)
Creatinine, Ser: 1.38 mg/dL — ABNORMAL HIGH (ref 0.61–1.24)
GFR, Estimated: 56 mL/min — ABNORMAL LOW (ref >60)
Glucose, Bld: 85 mg/dL (ref 70–99)
Potassium: 4.4 mmol/L (ref 3.5–5.1)
Sodium: 138 mmol/L (ref 135–145)
Total Bilirubin: 0.7 mg/dL (ref 0.0–1.2)
Total Protein: 7.1 g/dL (ref 6.5–8.1)

## 2024-03-12 LAB — PROTIME-INR
INR: 1.1 (ref 0.8–1.2)
Prothrombin Time: 15.1 s (ref 11.4–15.2)

## 2024-03-12 MED ORDER — LIDOCAINE HCL 1 % IJ SOLN
20.0000 mL | Freq: Once | INTRAMUSCULAR | Status: AC
  Administered 2024-03-12: 10 mL via INTRADERMAL

## 2024-03-12 MED ORDER — ALBUMIN HUMAN 25 % IV SOLN
INTRAVENOUS | Status: AC
  Filled 2024-03-12: qty 200

## 2024-03-12 MED ORDER — ALBUMIN HUMAN 25 % IV SOLN
50.0000 g | Freq: Once | INTRAVENOUS | Status: AC
  Administered 2024-03-12: 50 g via INTRAVENOUS

## 2024-03-12 MED ORDER — LIDOCAINE-EPINEPHRINE 1 %-1:100000 IJ SOLN
INTRAMUSCULAR | Status: AC
  Filled 2024-03-12: qty 1

## 2024-03-12 NOTE — Procedures (Signed)
 PROCEDURE SUMMARY:  Successful image-guided paracentesis from the left abdomen.  Yielded 5.1 liters of clear, straw-colored peritoneal fluid.  No immediate complications.  EBL: zero Patient tolerated well.   Please see imaging section of Epic for full dictation.  Carlin LABOR Daejah Klebba PA-C 03/12/2024 1:06 PM

## 2024-03-12 NOTE — Progress Notes (Signed)
  IR BRIEF PROGRESS NOTE:  Patient was seen for paracentesis in IR today, at [facility]. Patient is being evaluated for TIPS by Dr. Rosalio. Requested labs for tips (CBC, CMP, INR) were drawn at time of today's visit. Warren Dais, NP, aware.   Electronically Signed: Carlin DELENA Griffon, PA-C 03/12/2024, 1:02 PM

## 2024-03-19 ENCOUNTER — Emergency Department (HOSPITAL_COMMUNITY)
Admission: EM | Admit: 2024-03-19 | Discharge: 2024-03-19 | Disposition: A | Discharge status: Home / Self Care | Payer: Medicare (Managed Care) | Attending: Emergency Medicine | Admitting: Emergency Medicine

## 2024-03-19 ENCOUNTER — Other Ambulatory Visit: Payer: Self-pay

## 2024-03-19 ENCOUNTER — Ambulatory Visit (HOSPITAL_COMMUNITY)
Admission: RE | Admit: 2024-03-19 | Discharge: 2024-03-19 | Disposition: A | Discharge status: Home / Self Care | Payer: Medicare (Managed Care) | Source: Ambulatory Visit | Attending: Nurse Practitioner | Admitting: Nurse Practitioner

## 2024-03-19 DIAGNOSIS — R109 Unspecified abdominal pain: Secondary | ICD-10-CM | POA: Diagnosis present

## 2024-03-19 DIAGNOSIS — R140 Abdominal distension (gaseous): Secondary | ICD-10-CM | POA: Diagnosis not present

## 2024-03-19 DIAGNOSIS — K7031 Alcoholic cirrhosis of liver with ascites: Secondary | ICD-10-CM | POA: Insufficient documentation

## 2024-03-19 DIAGNOSIS — N472 Paraphimosis: Secondary | ICD-10-CM | POA: Diagnosis not present

## 2024-03-19 DIAGNOSIS — G8918 Other acute postprocedural pain: Secondary | ICD-10-CM | POA: Insufficient documentation

## 2024-03-19 HISTORY — PX: IR PARACENTESIS: IMG2679

## 2024-03-19 LAB — CBC WITH DIFFERENTIAL/PLATELET
Abs Immature Granulocytes: 0.01 K/uL (ref 0.00–0.07)
Basophils Absolute: 0 K/uL (ref 0.0–0.1)
Basophils Relative: 1 %
Eosinophils Absolute: 0.1 K/uL (ref 0.0–0.5)
Eosinophils Relative: 2 %
HCT: 31.1 % — ABNORMAL LOW (ref 39.0–52.0)
Hemoglobin: 9.7 g/dL — ABNORMAL LOW (ref 13.0–17.0)
Immature Granulocytes: 0 %
Lymphocytes Relative: 11 %
Lymphs Abs: 0.5 K/uL — ABNORMAL LOW (ref 0.7–4.0)
MCH: 24.9 pg — ABNORMAL LOW (ref 26.0–34.0)
MCHC: 31.2 g/dL (ref 30.0–36.0)
MCV: 79.7 fL — ABNORMAL LOW (ref 80.0–100.0)
Monocytes Absolute: 0.5 K/uL (ref 0.1–1.0)
Monocytes Relative: 12 %
Neutro Abs: 3.3 K/uL (ref 1.7–7.7)
Neutrophils Relative %: 74 %
Platelets: 136 K/uL — ABNORMAL LOW (ref 150–400)
RBC: 3.9 MIL/uL — ABNORMAL LOW (ref 4.22–5.81)
RDW: 17.1 % — ABNORMAL HIGH (ref 11.5–15.5)
WBC: 4.5 K/uL (ref 4.0–10.5)
nRBC: 0 % (ref 0.0–0.2)

## 2024-03-19 LAB — COMPREHENSIVE METABOLIC PANEL WITH GFR
ALT: <5 U/L (ref 0–44)
AST: 22 U/L (ref 15–41)
Albumin: 3.2 g/dL — ABNORMAL LOW (ref 3.5–5.0)
Alkaline Phosphatase: 91 U/L (ref 38–126)
Anion gap: 9 (ref 5–15)
BUN: 21 mg/dL (ref 8–23)
CO2: 24 mmol/L (ref 22–32)
Calcium: 8.6 mg/dL — ABNORMAL LOW (ref 8.9–10.3)
Chloride: 106 mmol/L (ref 98–111)
Creatinine, Ser: 1.44 mg/dL — ABNORMAL HIGH (ref 0.61–1.24)
GFR, Estimated: 53 mL/min — ABNORMAL LOW (ref >60)
Glucose, Bld: 78 mg/dL (ref 70–99)
Potassium: 3.6 mmol/L (ref 3.5–5.1)
Sodium: 139 mmol/L (ref 135–145)
Total Bilirubin: 0.9 mg/dL (ref 0.0–1.2)
Total Protein: 6.8 g/dL (ref 6.5–8.1)

## 2024-03-19 LAB — PROTIME-INR
INR: 1.1 (ref 0.8–1.2)
Prothrombin Time: 15.3 s — ABNORMAL HIGH (ref 11.4–15.2)

## 2024-03-19 MED ORDER — OXYCODONE HCL 5 MG PO TABS
5.0000 mg | ORAL_TABLET | Freq: Once | ORAL | Status: AC
  Administered 2024-03-19: 5 mg via ORAL
  Filled 2024-03-19: qty 1

## 2024-03-19 MED ORDER — LIDOCAINE-EPINEPHRINE 1 %-1:100000 IJ SOLN
INTRAMUSCULAR | Status: AC
  Filled 2024-03-19: qty 1

## 2024-03-19 NOTE — ED Triage Notes (Signed)
 BIBA from home- Had fluid (about 6L) drawn off of abdomen this afternoon, woke up from a nap and abdomen is distended and leaking from the site, and is tender

## 2024-03-19 NOTE — ED Provider Notes (Signed)
 [place] EMERGENCY DEPARTMENT AT [facility] Provider Note   CSN: [id number] Arrival date & time: 03/19/24  8146     Patient presents with: No chief complaint on file.   John [name] is a 68 y.o. male.   With alcoholic cirrhosis and ascites coming in status post IR paracentesis with 4 L of fluid removal complaining of abdominal pain and distention as well as leakage from paracentesis site.  He reports that he was feeling well following paracentesis, took 1 dose of his diuretics and took a nap, when he awoke he was still leaking from the paracentesis site and his abdomen was painful and distended.  He reports less than ideal compliance to his diuretics prior to today and reports that his abdominal pain is similar to what he typically has following paracentesis, but was mostly concerned for the leakage from the paracentesis site.  Denies fevers, chills or other concerning symptoms at this time        Prior to Admission medications   Medication Sig Start Date End Date Taking? Authorizing Provider  albuterol  (PROAIR  HFA) 108 (90 Base) MCG/ACT inhaler Inhale 2 puffs into the lungs every 6 (six) hours as needed for wheezing or shortness of breath. 12/11/23   Rizwan, Saima, MD  allopurinol  (ZYLOPRIM ) 100 MG tablet Take 0.5 tablets (50 mg total) by mouth daily. 12/11/23 03/10/24  Rizwan, Saima, MD  atorvastatin  (LIPITOR) 40 MG tablet Take 1 tablet (40 mg total) by mouth at bedtime. 12/11/23 03/10/24  Rizwan, Saima, MD  beclomethasone (QVAR  REDIHALER) 80 MCG/ACT inhaler Inhale 1 puff into the lungs 2 (two) times daily. 12/11/23   Rizwan, Saima, MD  brimonidine  (ALPHAGAN ) 0.2 % ophthalmic solution Place 1 drop into both eyes 2 (two) times daily. 12/11/23   Rizwan, Saima, MD  FEROSUL 325 (65 Fe) MG tablet Take 1 tablet (325 mg total) by mouth every Monday, Wednesday, and Friday. 12/12/23 03/11/24  Rizwan, Saima, MD  furosemide  (LASIX ) 40 MG tablet Take 1 tablet (40 mg total) by mouth  daily. 12/11/23 03/10/24  Rizwan, Saima, MD  ketoconazole  (NIZORAL ) 2 % shampoo Apply topically to shampoo the scalp 2 times a week 12/11/23   Rizwan, Saima, MD  lactulose  (CHRONULAC ) 10 GM/15ML solution Take 15 mLs (10 g total) by mouth 2 (two) times daily as needed for mild constipation. 12/11/23   Rizwan, Saima, MD  Menthol -Methyl Salicylate  (SALONPAS  PAIN RELIEF  PATCH) PTCH Apply 1 patch topically daily as needed (for mid-back pain and lower back pain). 12/11/23   Rizwan, Saima, MD  metoprolol  succinate (TOPROL  XL) 25 MG 24 hr tablet Take 1 tablet (25 mg total) by mouth at bedtime. 12/11/23 03/10/24  Rizwan, Saima, MD  omeprazole  (PRILOSEC) 40 MG capsule Take 1 capsule (40 mg total) by mouth daily before breakfast. 12/11/23 03/10/24  Rizwan, Saima, MD  potassium chloride  (KLOR-CON ) 10 MEQ tablet Take 1 tablet (10 mEq total) by mouth daily. 12/11/23 03/10/24  Rizwan, Saima, MD  spironolactone  (ALDACTONE ) 50 MG tablet Take 0.5 tablets (25 mg total) by mouth daily for 60 doses. 12/11/23 02/09/24  Rizwan, Saima, MD    Allergies: Penicillins    Review of Systems  Updated Vital Signs BP 116/88   Pulse 96   Temp 97.7 F (36.5 C) (Oral)   Resp 18   SpO2 98%   Physical Exam Exam conducted with a chaperone present.  Constitutional:      Appearance: Normal appearance.  HENT:     Mouth/Throat:     Mouth: Mucous membranes  are moist.     Pharynx: Oropharynx is clear.  Eyes:     Extraocular Movements: Extraocular movements intact.     Pupils: Pupils are equal, round, and reactive to light.  Cardiovascular:     Rate and Rhythm: Normal rate and regular rhythm.  Pulmonary:     Effort: Pulmonary effort is normal.     Breath sounds: Normal breath sounds.  Abdominal:     General: There is distension.     Tenderness: There is abdominal tenderness.  Genitourinary:    Penis: Uncircumcised. Paraphimosis and swelling present.      Testes: Normal.  Musculoskeletal:        General: Normal range of  motion.     Cervical back: Normal range of motion.  Skin:    General: Skin is warm and dry.  Neurological:     General: No focal deficit present.     Mental Status: He is alert.     (all labs ordered are listed, but only abnormal results are displayed) Labs Reviewed  COMPREHENSIVE METABOLIC PANEL WITH GFR - Abnormal; Notable for the following components:      Result Value   Creatinine, Ser 1.44 (*)    Calcium  8.6 (*)    Albumin  3.2 (*)    GFR, Estimated 53 (*)    All other components within normal limits  CBC WITH DIFFERENTIAL/PLATELET - Abnormal; Notable for the following components:   RBC 3.90 (*)    Hemoglobin 9.7 (*)    HCT 31.1 (*)    MCV 79.7 (*)    MCH 24.9 (*)    RDW 17.1 (*)    Platelets 136 (*)    Lymphs Abs 0.5 (*)    All other components within normal limits  PROTIME-INR    EKG: None  Radiology: IR Paracentesis Result Date: 03/19/2024 INDICATION: Alcoholic Cirrhosis of LIver with Ascites History of alcoholic cirrhosis, recurrent ascites. Request for therapeutic paracentesis. EXAM: ULTRASOUND GUIDED THERAPEUTIC PARACENTESIS MEDICATIONS: 8 mL 1% lidocaine  COMPLICATIONS: None immediate. PROCEDURE: Informed written consent was obtained from the patient after a discussion of the risks, benefits and alternatives to treatment. A timeout was performed prior to the initiation of the procedure. Initial ultrasound scanning demonstrates a moderate amount of ascites within the left lower abdominal quadrant. The left lower abdomen was prepped and draped in the usual sterile fashion. 1% lidocaine  was used for local anesthesia. Following this, a 19 gauge, 10-cm, Yueh catheter was introduced. An ultrasound image was saved for documentation purposes. The paracentesis was performed. The catheter was removed and a dressing was applied. The patient tolerated the procedure well without immediate post procedural complication. FINDINGS: A total of approximately 4 liters of hazy yellow fluid  was removed. IMPRESSION: Successful ultrasound-guided paracentesis yielding 4.0 L of peritoneal fluid. Performed by: Kimble Clas, PA-C PLAN: The patient has previously been formally evaluated by the [facility] Interventional Radiology Portal Hypertension Clinic and is being actively followed for potential future intervention. Refer to clinical consultation note by Dr. Ester Sides, 02/16/2024. Thom Hall, MD [facility] Electronically Signed   By: Thom Hall M.D.   On: 03/19/2024 14:29     Procedures   Medications Ordered in the ED  oxyCODONE  (Oxy IR/ROXICODONE ) immediate release tablet 5 mg (has no administration in time range)  Medical Decision Making This is a 68 year old patient presenting with abdominal tenderness and leakage from paracentesis site following IR paracentesis for ascites.  CBC, CMP and PT/INR obtained, all within normal limits.  Over the course of his stay in the ED leakage subsided, patient was given 1 dose of oxycodone  for his abdominal pain and discharged home.  He was given strict return precautions and counseled on appropriate use of his diuretic medications.  Amount and/or Complexity of Data Reviewed Labs: ordered.  Risk Prescription drug management.   Final diagnoses:  Post-op pain    ED Discharge Orders     None          Cleotilde Lukes, DO 03/19/24 2056    Doretha Folks, MD 03/19/24 2155

## 2024-03-19 NOTE — Discharge Instructions (Signed)
 Please take your medications as prescribed by your doctors. This will help reduce your swelling and improve your pain.

## 2024-03-21 ENCOUNTER — Emergency Department (HOSPITAL_COMMUNITY)
Admission: EM | Admit: 2024-03-21 | Discharge: 2024-03-21 | Disposition: A | Discharge status: Home / Self Care | Payer: Medicare (Managed Care) | Attending: Emergency Medicine | Admitting: Emergency Medicine

## 2024-03-21 ENCOUNTER — Encounter (HOSPITAL_COMMUNITY): Payer: Self-pay

## 2024-03-21 ENCOUNTER — Other Ambulatory Visit: Payer: Self-pay

## 2024-03-21 DIAGNOSIS — N481 Balanitis: Secondary | ICD-10-CM | POA: Diagnosis not present

## 2024-03-21 DIAGNOSIS — E119 Type 2 diabetes mellitus without complications: Secondary | ICD-10-CM | POA: Diagnosis not present

## 2024-03-21 DIAGNOSIS — N183 Chronic kidney disease, stage 3 unspecified: Secondary | ICD-10-CM | POA: Insufficient documentation

## 2024-03-21 DIAGNOSIS — N4889 Other specified disorders of penis: Secondary | ICD-10-CM | POA: Diagnosis present

## 2024-03-21 DIAGNOSIS — I509 Heart failure, unspecified: Secondary | ICD-10-CM | POA: Diagnosis not present

## 2024-03-21 LAB — COMPREHENSIVE METABOLIC PANEL WITH GFR
ALT: 10 U/L (ref 0–44)
AST: 25 U/L (ref 15–41)
Albumin: 3.3 g/dL — ABNORMAL LOW (ref 3.5–5.0)
Alkaline Phosphatase: 81 U/L (ref 38–126)
Anion gap: 8 (ref 5–15)
BUN: 20 mg/dL (ref 8–23)
CO2: 24 mmol/L (ref 22–32)
Calcium: 8.5 mg/dL — ABNORMAL LOW (ref 8.9–10.3)
Chloride: 105 mmol/L (ref 98–111)
Creatinine, Ser: 1.58 mg/dL — ABNORMAL HIGH (ref 0.61–1.24)
GFR, Estimated: 48 mL/min — ABNORMAL LOW (ref >60)
Glucose, Bld: 84 mg/dL (ref 70–99)
Potassium: 3.8 mmol/L (ref 3.5–5.1)
Sodium: 137 mmol/L (ref 135–145)
Total Bilirubin: 1 mg/dL (ref 0.0–1.2)
Total Protein: 6.9 g/dL (ref 6.5–8.1)

## 2024-03-21 LAB — CBC WITH DIFFERENTIAL/PLATELET
Abs Immature Granulocytes: 0.01 K/uL (ref 0.00–0.07)
Basophils Absolute: 0 K/uL (ref 0.0–0.1)
Basophils Relative: 1 %
Eosinophils Absolute: 0.1 K/uL (ref 0.0–0.5)
Eosinophils Relative: 3 %
HCT: 31 % — ABNORMAL LOW (ref 39.0–52.0)
Hemoglobin: 9.7 g/dL — ABNORMAL LOW (ref 13.0–17.0)
Immature Granulocytes: 0 %
Lymphocytes Relative: 13 %
Lymphs Abs: 0.5 K/uL — ABNORMAL LOW (ref 0.7–4.0)
MCH: 25.1 pg — ABNORMAL LOW (ref 26.0–34.0)
MCHC: 31.3 g/dL (ref 30.0–36.0)
MCV: 80.3 fL (ref 80.0–100.0)
Monocytes Absolute: 0.7 K/uL (ref 0.1–1.0)
Monocytes Relative: 16 %
Neutro Abs: 2.8 K/uL (ref 1.7–7.7)
Neutrophils Relative %: 67 %
Platelets: 137 K/uL — ABNORMAL LOW (ref 150–400)
RBC: 3.86 MIL/uL — ABNORMAL LOW (ref 4.22–5.81)
RDW: 17.1 % — ABNORMAL HIGH (ref 11.5–15.5)
WBC: 4.2 K/uL (ref 4.0–10.5)
nRBC: 0 % (ref 0.0–0.2)

## 2024-03-21 LAB — URINALYSIS, W/ REFLEX TO CULTURE (INFECTION SUSPECTED)
Bacteria, UA: NONE SEEN
Bilirubin Urine: NEGATIVE
Glucose, UA: NEGATIVE mg/dL
Hgb urine dipstick: NEGATIVE
Ketones, ur: NEGATIVE mg/dL
Leukocytes,Ua: NEGATIVE
Nitrite: NEGATIVE
Protein, ur: NEGATIVE mg/dL
Specific Gravity, Urine: 1.012 (ref 1.005–1.030)
pH: 5 (ref 5.0–8.0)

## 2024-03-21 MED ORDER — CHLORHEXIDINE GLUCONATE CLOTH 2 % EX PADS: 6.0000 | MEDICATED_PAD | Freq: Every day | CUTANEOUS | Status: DC

## 2024-03-21 MED ORDER — MUPIROCIN CALCIUM 2 % EX CREA: 1.0000 | TOPICAL_CREAM | Freq: Two times a day (BID) | CUTANEOUS | 0 refills | Status: AC

## 2024-03-21 MED ORDER — FUROSEMIDE 10 MG/ML IJ SOLN
60.0000 mg | Freq: Once | INTRAMUSCULAR | Status: AC
Start: 2024-03-21 — End: 2024-03-21
  Administered 2024-03-21: 60 mg via INTRAVENOUS
  Filled 2024-03-21: qty 8

## 2024-03-21 MED ORDER — FUROSEMIDE 40 MG PO TABS: 80.0000 mg | ORAL_TABLET | Freq: Every day | ORAL | 2 refills | Status: AC

## 2024-03-21 NOTE — Discharge Instructions (Addendum)
 As discussed, we will double your dose of your daily Lasix , and have you follow-up with your primary care.  You can take 2 tablets of your Lasix  once daily, for the next 5 days.  Also follow-up with your primary care in the next several weeks for reassessment and removal of Foley catheter once your condition is resolved.

## 2024-03-21 NOTE — ED Notes (Signed)
 Pt axox4. GCS 15. Pt verbalizes understanding of discharge instructions and follow up. Pt escorted out via stretcher by [facility] to transportation home.  PT attempted to empty urine collection bag by self prior to discharge, but accidentally spilled on floor

## 2024-03-21 NOTE — ED Provider Notes (Signed)
 [place] [facility] DEPARTMENT AT [facility] Provider Note   CSN: [id number] Arrival date & time: 03/21/24  1037     Patient presents with: genital pain   John [name] is a 68 y.o. male who presents to the ED today with penile swelling and pain.  He states this has been worsening over the last 3 days, denies having any penile discharge, is uncircumcised.  The pain and swelling is continuously increased over the last several days and now he is unable to walk without significant pain.  He has had some urinary output but states it is much reduced compared to previous and does have pelvic pressure as a result.  Noted previous history of type 2 diabetes as well as stage III CKD.   HPI     Prior to Admission medications   Medication Sig Start Date End Date Taking? Authorizing Provider  mupirocin  cream (BACTROBAN ) 2 % Apply 1 Application topically 2 (two) times daily. 03/21/24  Yes Myriam Dorn BROCKS, PA  albuterol  (PROAIR  HFA) 108 (90 Base) MCG/ACT inhaler Inhale 2 puffs into the lungs every 6 (six) hours as needed for wheezing or shortness of breath. 12/11/23   Rizwan, Saima, MD  allopurinol  (ZYLOPRIM ) 100 MG tablet Take 0.5 tablets (50 mg total) by mouth daily. 12/11/23 03/10/24  Rizwan, Saima, MD  atorvastatin  (LIPITOR) 40 MG tablet Take 1 tablet (40 mg total) by mouth at bedtime. 12/11/23 03/10/24  Rizwan, Saima, MD  beclomethasone (QVAR  REDIHALER) 80 MCG/ACT inhaler Inhale 1 puff into the lungs 2 (two) times daily. 12/11/23   Rizwan, Saima, MD  brimonidine  (ALPHAGAN ) 0.2 % ophthalmic solution Place 1 drop into both eyes 2 (two) times daily. 12/11/23   Rizwan, Saima, MD  FEROSUL 325 (65 Fe) MG tablet Take 1 tablet (325 mg total) by mouth every Monday, Wednesday, and Friday. 12/12/23 03/11/24  Rizwan, Saima, MD  furosemide  (LASIX ) 40 MG tablet Take 2 tablets (80 mg total) by mouth daily for 5 days. 03/21/24 03/26/24  Myriam Dorn BROCKS, PA  ketoconazole  (NIZORAL ) 2 % shampoo  Apply topically to shampoo the scalp 2 times a week 12/11/23   Rizwan, Saima, MD  lactulose  (CHRONULAC ) 10 GM/15ML solution Take 15 mLs (10 g total) by mouth 2 (two) times daily as needed for mild constipation. 12/11/23   Rizwan, Saima, MD  Menthol -Methyl Salicylate  (SALONPAS  PAIN RELIEF  PATCH) PTCH Apply 1 patch topically daily as needed (for mid-back pain and lower back pain). 12/11/23   Rizwan, Saima, MD  metoprolol  succinate (TOPROL  XL) 25 MG 24 hr tablet Take 1 tablet (25 mg total) by mouth at bedtime. 12/11/23 03/10/24  Rizwan, Saima, MD  omeprazole  (PRILOSEC) 40 MG capsule Take 1 capsule (40 mg total) by mouth daily before breakfast. 12/11/23 03/10/24  Rizwan, Saima, MD  potassium chloride  (KLOR-CON ) 10 MEQ tablet Take 1 tablet (10 mEq total) by mouth daily. 12/11/23 03/10/24  Rizwan, Saima, MD  spironolactone  (ALDACTONE ) 50 MG tablet Take 0.5 tablets (25 mg total) by mouth daily for 60 doses. 12/11/23 02/09/24  Rizwan, Saima, MD    Allergies: Penicillins    Review of Systems  Genitourinary:  Positive for decreased urine volume, penile pain and penile swelling.  All other systems reviewed and are negative.   Updated Vital Signs BP 123/88 (BP Location: Left Arm)   Pulse 83   Temp (!) 97.5 F (36.4 C) (Oral)   Resp 18   Ht 5' 7 (1.702 m)   Wt 97.1 kg   SpO2 97%  BMI 33.52 kg/m   Physical Exam Vitals and nursing note reviewed.  Constitutional:      General: He is not in acute distress.    Appearance: Normal appearance.  HENT:     Head: Normocephalic and atraumatic.     Mouth/Throat:     Mouth: Mucous membranes are moist.     Pharynx: Oropharynx is clear.  Eyes:     Extraocular Movements: Extraocular movements intact.     Conjunctiva/sclera: Conjunctivae normal.     Pupils: Pupils are equal, round, and reactive to light.  Cardiovascular:     Rate and Rhythm: Normal rate and regular rhythm.     Pulses: Normal pulses.     Heart sounds: Normal heart sounds. No murmur heard.     No friction rub. No gallop.  Pulmonary:     Effort: Pulmonary effort is normal.     Breath sounds: Normal breath sounds.  Abdominal:     General: Abdomen is flat. Bowel sounds are normal.     Palpations: Abdomen is soft.  Genitourinary:    Penis: Uncircumcised. Phimosis, tenderness and swelling present.      Testes: Normal. Cremasteric reflex is present.     Comments: There is noted swelling of the foreskin and unable to retract foreskin over the glans.  Musculoskeletal:        General: Normal range of motion.     Cervical back: Normal range of motion and neck supple.  Skin:    General: Skin is warm and dry.     Capillary Refill: Capillary refill takes less than 2 seconds.  Neurological:     General: No focal deficit present.     Mental Status: He is alert and oriented to person, place, and time. Mental status is at baseline.     GCS: GCS eye subscore is 4. GCS verbal subscore is 5. GCS motor subscore is 6.  Psychiatric:        Mood and Affect: Mood normal.     (all labs ordered are listed, but only abnormal results are displayed) Labs Reviewed  COMPREHENSIVE METABOLIC PANEL WITH GFR - Abnormal; Notable for the following components:      Result Value   Creatinine, Ser 1.58 (*)    Calcium  8.5 (*)    Albumin  3.3 (*)    GFR, Estimated 48 (*)    All other components within normal limits  CBC WITH DIFFERENTIAL/PLATELET - Abnormal; Notable for the following components:   RBC 3.86 (*)    Hemoglobin 9.7 (*)    HCT 31.0 (*)    MCH 25.1 (*)    RDW 17.1 (*)    Platelets 137 (*)    Lymphs Abs 0.5 (*)    All other components within normal limits  URINALYSIS, W/ REFLEX TO CULTURE (INFECTION SUSPECTED)    EKG: None  Radiology: No results found.   Procedures   Medications Ordered in the ED  Chlorhexidine  Gluconate Cloth 2 % PADS 6 each (has no administration in time range)  furosemide  (LASIX ) injection 60 mg (has no administration in time range)                                      Medical Decision Making Amount and/or Complexity of Data Reviewed Labs: ordered.  Risk OTC drugs. Prescription drug management.   Medical Decision Making:   John [name] is a 68 y.o. male who presented to  the ED today with penile swelling detailed above.     Complete initial physical exam performed, notably the patient  was alert and oriented in no apparent distress.  There is notable penile swelling as well as inability to retract the foreskin, obvious urinary retention within the foreskin..    Reviewed and confirmed nursing documentation for past medical history, family history, social history.    Initial Assessment:   With the patient's presentation of panel swelling urinary retention, most likely diagnosis is phimosis as well as balanitis.   Initial Plan:  Bladder scan to assess degree of urinary retention Screening labs including CBC and Metabolic panel to evaluate for infectious or metabolic etiology of disease.  Urinalysis with reflex culture ordered to evaluate for UTI or relevant urologic/nephrologic pathology.  Objective evaluation as below reviewed   Initial Study Results:   Laboratory  All laboratory results reviewed without evidence of clinically relevant pathology.   Exceptions include: Creatinine is 1.58 however this is baseline for this patient.  Bladder scan shows 538 mL of urine in the bladder after initial presentation.     Consults: Case discussed with Dr. Norva with urology.   Reassessment and Plan:   On reassessment of the patient, he has had scant urinary output and has increased distention and tenderness of the pelvis/lower abdomen.  Given this acute urinary retention, with phimosis and scant urinary output consulted again with urology who will come to see the patient to put in a urinary catheter as attempts by nursing staff to do this today but been unsuccessful.  Plan at this time is to discharge after Foley catheter placed for  acute urinary retention, will manage for balanitis with topical mupirocin , may have other recommendations based on urology assessment.  Anticipate discharge after urology assessment and Foley catheter placement.  Consultation with urology, she believes that this penile swelling is secondary to acute exacerbation of patient's CHF.  He has no shortness of breath, does not have any findings of dependent edema, and as such we will manage this with a increased dosing of his oral furosemide , have also ordered that he received a dose of furosemide  prior to departure from the ED.  Plan at this time is to discharge with outpatient follow-up to his primary care, continue taking 80 mg daily of his Lasix  over the next 5 days, and then resume his normal dosing of furosemide .  Careful return precautions have been given which he understands and agrees.  Will discharge at this time with outpatient follow-up to primary care as previously discussed.       Final diagnoses:  Balanitis  Acute on chronic congestive heart failure, unspecified heart failure type [facility])    ED Discharge Orders          Ordered    mupirocin  cream (BACTROBAN ) 2 %  2 times daily        03/21/24 1422    furosemide  (LASIX ) 40 MG tablet  Daily        03/21/24 1525               Myriam Dorn BROCKS, [name] 03/21/24 1527    Bari Roxie HERO, DO 03/25/24 1258

## 2024-03-21 NOTE — ED Notes (Signed)
 First poc with patient, pt resting in bed.  No respiratory distress noted. Denies pain. Awaiting transport by [facility].  Urine leg bag recently emptied by NT.

## 2024-03-21 NOTE — ED Notes (Signed)
[facility] called and paper work printed.  

## 2024-03-21 NOTE — ED Triage Notes (Signed)
 Pt bib ems due to genital swelling starting prior to Friday of last week. Pain and swelling has continuously increased. Pt complains of pain on walking. Denies hx of genital swelling/pain.

## 2024-03-26 ENCOUNTER — Encounter (HOSPITAL_COMMUNITY): Payer: Self-pay | Admitting: *Deleted

## 2024-03-26 ENCOUNTER — Emergency Department (HOSPITAL_COMMUNITY)
Admission: EM | Admit: 2024-03-26 | Discharge: 2024-03-26 | Disposition: A | Discharge status: Home / Self Care | Payer: Medicare (Managed Care) | Attending: Emergency Medicine | Admitting: Emergency Medicine

## 2024-03-26 ENCOUNTER — Other Ambulatory Visit: Payer: Self-pay

## 2024-03-26 ENCOUNTER — Emergency Department (HOSPITAL_COMMUNITY): Payer: Medicare (Managed Care)

## 2024-03-26 ENCOUNTER — Other Ambulatory Visit (HOSPITAL_COMMUNITY): Payer: Self-pay

## 2024-03-26 ENCOUNTER — Inpatient Hospital Stay (HOSPITAL_COMMUNITY)
Admission: RE | Admit: 2024-03-26 | Discharge: 2024-03-26 | Disposition: A | Discharge status: Home / Self Care | Payer: Medicare (Managed Care) | Source: Ambulatory Visit | Attending: Nurse Practitioner | Admitting: Nurse Practitioner

## 2024-03-26 DIAGNOSIS — R300 Dysuria: Secondary | ICD-10-CM | POA: Diagnosis present

## 2024-03-26 DIAGNOSIS — K7031 Alcoholic cirrhosis of liver with ascites: Secondary | ICD-10-CM | POA: Insufficient documentation

## 2024-03-26 DIAGNOSIS — Z978 Presence of other specified devices: Secondary | ICD-10-CM

## 2024-03-26 DIAGNOSIS — N3001 Acute cystitis with hematuria: Secondary | ICD-10-CM

## 2024-03-26 DIAGNOSIS — N4829 Other inflammatory disorders of penis: Secondary | ICD-10-CM | POA: Diagnosis not present

## 2024-03-26 LAB — URINALYSIS, ROUTINE W REFLEX MICROSCOPIC
Bilirubin Urine: NEGATIVE
Glucose, UA: NEGATIVE mg/dL
Ketones, ur: NEGATIVE mg/dL
Nitrite: NEGATIVE
Protein, ur: 100 mg/dL — AB
RBC / HPF: >50 RBC/hpf (ref 0–5)
Specific Gravity, Urine: 1.012 (ref 1.005–1.030)
WBC, UA: >50 WBC/hpf (ref 0–5)
pH: 5 (ref 5.0–8.0)

## 2024-03-26 LAB — COMPREHENSIVE METABOLIC PANEL WITH GFR
ALT: <5 U/L (ref 0–44)
AST: 25 U/L (ref 15–41)
Albumin: 3.2 g/dL — ABNORMAL LOW (ref 3.5–5.0)
Alkaline Phosphatase: 71 U/L (ref 38–126)
Anion gap: 8 (ref 5–15)
BUN: 21 mg/dL (ref 8–23)
CO2: 24 mmol/L (ref 22–32)
Calcium: 8.9 mg/dL (ref 8.9–10.3)
Chloride: 108 mmol/L (ref 98–111)
Creatinine, Ser: 1.72 mg/dL — ABNORMAL HIGH (ref 0.61–1.24)
GFR, Estimated: 43 mL/min — ABNORMAL LOW (ref >60)
Glucose, Bld: 94 mg/dL (ref 70–99)
Potassium: 4.2 mmol/L (ref 3.5–5.1)
Sodium: 140 mmol/L (ref 135–145)
Total Bilirubin: 0.8 mg/dL (ref 0.0–1.2)
Total Protein: 7 g/dL (ref 6.5–8.1)

## 2024-03-26 LAB — CBC WITH DIFFERENTIAL/PLATELET
Abs Immature Granulocytes: 0.01 K/uL (ref 0.00–0.07)
Basophils Absolute: 0 K/uL (ref 0.0–0.1)
Basophils Relative: 1 %
Eosinophils Absolute: 0.1 K/uL (ref 0.0–0.5)
Eosinophils Relative: 2 %
HCT: 30 % — ABNORMAL LOW (ref 39.0–52.0)
Hemoglobin: 9.5 g/dL — ABNORMAL LOW (ref 13.0–17.0)
Immature Granulocytes: 0 %
Lymphocytes Relative: 8 %
Lymphs Abs: 0.4 K/uL — ABNORMAL LOW (ref 0.7–4.0)
MCH: 25.6 pg — ABNORMAL LOW (ref 26.0–34.0)
MCHC: 31.7 g/dL (ref 30.0–36.0)
MCV: 80.9 fL (ref 80.0–100.0)
Monocytes Absolute: 0.7 K/uL (ref 0.1–1.0)
Monocytes Relative: 12 %
Neutro Abs: 4.3 K/uL (ref 1.7–7.7)
Neutrophils Relative %: 77 %
Platelets: 123 K/uL — ABNORMAL LOW (ref 150–400)
RBC: 3.71 MIL/uL — ABNORMAL LOW (ref 4.22–5.81)
RDW: 16.8 % — ABNORMAL HIGH (ref 11.5–15.5)
WBC: 5.6 K/uL (ref 4.0–10.5)
nRBC: 0 % (ref 0.0–0.2)

## 2024-03-26 MED ORDER — CIPROFLOXACIN HCL 500 MG PO TABS
500.0000 mg | ORAL_TABLET | Freq: Two times a day (BID) | ORAL | 0 refills | Status: AC
  Filled 2024-03-26: qty 14, 7d supply, fill #0

## 2024-03-26 MED ORDER — LIDOCAINE-EPINEPHRINE (PF) 2 %-1:200000 IJ SOLN
INTRAMUSCULAR | Status: AC
  Filled 2024-03-26: qty 20

## 2024-03-26 MED ORDER — CIPROFLOXACIN HCL 500 MG PO TABS
500.0000 mg | ORAL_TABLET | Freq: Once | ORAL | Status: AC
  Administered 2024-03-26: 500 mg via ORAL
  Filled 2024-03-26: qty 1

## 2024-03-26 NOTE — Discharge Instructions (Addendum)
 Take the antibiotic for your urinary tract infection.  Follow-up with Dr. Cloria in the next couple weeks to check your swelling in your urinary tract infection.  You need to follow up with the urologist for your foley catheter.

## 2024-03-26 NOTE — ED Notes (Signed)
 [facility] notified for transport

## 2024-03-26 NOTE — ED Provider Notes (Signed)
 Pt signed out by Dr. Suzette pending paracentesis.  Pt does feel better after paracentesis.  His foley catheter was placed on 11/23 by urology because he had phimosis and swelling of his penis.  He now has a UTI and has been started on cipro  by Dr. Zammit.  Pt has not yet made an appt with urology.  He's encouraged to do so.  He is to continue to take his lasix .  He is told to return if worse.     Dean Clarity, MD 03/26/24 [phone]

## 2024-03-26 NOTE — ED Triage Notes (Signed)
 Here by [facility] from [facility] for genital pain and swelling with some decreased u.o. and urinary frequency. Urinary catheter present. Describes as 2-3x larger than usual. Gradually progressively worse. [facility] reports HR 132, and BP 104/palpation. Long, complex chronic hx. Alert, NAD, calm, interactive, resps e/u, speaking in clear complete sentences.Skin W&D.

## 2024-03-26 NOTE — Procedures (Signed)
 PROCEDURE SUMMARY:  Successful ultrasound guided therapeutic paracentesis from the left lower quadrant.  Yielded 4.7 liters of hazy,amber fluid.  No immediate complications.  The patient tolerated the procedure well.   Specimen  was not sent for labs.  EBL none  The patient has previously been formally evaluated by the [facility] and is being actively followed for potential future intervention.   Kevin Rhilyn Battle,PA-C

## 2024-03-26 NOTE — ED Notes (Signed)
 Patient transported to IR

## 2024-03-26 NOTE — ED Provider Notes (Signed)
 [place] EM[facility]GENCY DEPARTMENT AT [facility] Provider Note   CSN: [id number] Arrival date & time: 03/26/24  1015     Patient presents with: Groin Swelling   John [name] is a 68 y.o. male.   Patient has a history of cirrhosis.  Patient complains of swelling around his scrotum and penis.  Patient has a Foley catheter in.  He also complains of dysuria.  Patient was supposed to get a paracentesis done at [facility] today at 1:00.  The history is provided by the patient and medical records. No language interpreter was used.  Weakness Severity:  Moderate Onset quality:  Sudden Timing:  Constant Progression:  Waxing and waning Chronicity:  Recurrent Context: not alcohol use   Relieved by:  Nothing Worsened by:  Nothing Ineffective treatments:  None tried Associated symptoms: no abdominal pain, no chest pain, no cough, no diarrhea, no frequency, no headaches and no seizures        Prior to Admission medications   Medication Sig Start Date End Date Taking? Authorizing Provider  albuterol  (PROAIR  HFA) 108 (90 Base) MCG/ACT inhaler Inhale 2 puffs into the lungs every 6 (six) hours as needed for wheezing or shortness of breath. 12/11/23   Rizwan, Saima, MD  allopurinol  (ZYLOPRIM ) 100 MG tablet Take 0.5 tablets (50 mg total) by mouth daily. 12/11/23 03/10/24  Rizwan, Saima, MD  atorvastatin  (LIPITOR) 40 MG tablet Take 1 tablet (40 mg total) by mouth at bedtime. 12/11/23 03/10/24  Rizwan, Saima, MD  beclomethasone (QVAR  REDIHALER) 80 MCG/ACT inhaler Inhale 1 puff into the lungs 2 (two) times daily. 12/11/23   Rizwan, Saima, MD  brimonidine  (ALPHAGAN ) 0.2 % ophthalmic solution Place 1 drop into both eyes 2 (two) times daily. 12/11/23   Rizwan, Saima, MD  FEROSUL 325 (65 Fe) MG tablet Take 1 tablet (325 mg total) by mouth every Monday, Wednesday, and Friday. 12/12/23 03/11/24  Rizwan, Saima, MD  furosemide  (LASIX ) 40 MG tablet Take 2 tablets (80 mg total) by mouth daily for 5 days.  03/21/24 03/26/24  Myriam Dorn BROCKS, PA  ketoconazole  (NIZORAL ) 2 % shampoo Apply topically to shampoo the scalp 2 times a week 12/11/23   Rizwan, Saima, MD  lactulose  (CHRONULAC ) 10 GM/15ML solution Take 15 mLs (10 g total) by mouth 2 (two) times daily as needed for mild constipation. 12/11/23   Rizwan, Saima, MD  Menthol -Methyl Salicylate  (SALONPAS  PAIN RELIEF  PATCH) PTCH Apply 1 patch topically daily as needed (for mid-back pain and lower back pain). 12/11/23   Rizwan, Saima, MD  metoprolol  succinate (TOPROL  XL) 25 MG 24 hr tablet Take 1 tablet (25 mg total) by mouth at bedtime. 12/11/23 03/10/24  Rizwan, Saima, MD  mupirocin  cream (BACTROBAN ) 2 % Apply 1 Application topically 2 (two) times daily. 03/21/24   Myriam Dorn BROCKS, PA  omeprazole  (PRILOSEC) 40 MG capsule Take 1 capsule (40 mg total) by mouth daily before breakfast. 12/11/23 03/10/24  Earley Saucer, MD  potassium chloride  (KLOR-CON ) 10 MEQ tablet Take 1 tablet (10 mEq total) by mouth daily. 12/11/23 03/10/24  Rizwan, Saima, MD  spironolactone  (ALDACTONE ) 50 MG tablet Take 0.5 tablets (25 mg total) by mouth daily for 60 doses. 12/11/23 02/09/24  Rizwan, Saima, MD    Allergies: Penicillins    Review of Systems  Constitutional:  Negative for appetite change and fatigue.  HENT:  Negative for congestion, ear discharge and sinus pressure.   Eyes:  Negative for discharge.  Respiratory:  Negative for cough.   Cardiovascular:  Negative for  chest pain.  Gastrointestinal:  Negative for abdominal pain and diarrhea.  Genitourinary:  Negative for frequency and hematuria.       Swelling in groin  Musculoskeletal:  Negative for back pain.  Skin:  Negative for rash.  Neurological:  Positive for weakness. Negative for seizures and headaches.  Psychiatric/Behavioral:  Negative for hallucinations.     Updated Vital Signs BP 112/83 (BP Location: Left Arm)   Pulse 87   Temp 97.7 F (36.5 C) (Oral)   Resp 16   Wt 97.1 kg   SpO2 97%   BMI  33.52 kg/m   Physical Exam Vitals and nursing note reviewed.  Constitutional:      Appearance: He is well-developed.  HENT:     Head: Normocephalic.     Nose: Nose normal.  Eyes:     General: No scleral icterus.    Conjunctiva/sclera: Conjunctivae normal.  Neck:     Thyroid: No thyromegaly.  Cardiovascular:     Rate and Rhythm: Normal rate and regular rhythm.     Heart sounds: No murmur heard.    No friction rub. No gallop.  Pulmonary:     Breath sounds: No stridor. No wheezing or rales.  Chest:     Chest wall: No tenderness.  Abdominal:     General: There is distension.     Tenderness: There is no abdominal tenderness. There is no rebound.  Genitourinary:    Comments: Swelling around penis and scrotum. Musculoskeletal:        General: Normal range of motion.     Cervical back: Neck supple.  Lymphadenopathy:     Cervical: No cervical adenopathy.  Skin:    Findings: No erythema or rash.  Neurological:     Mental Status: He is alert and oriented to person, place, and time.     Motor: No abnormal muscle tone.     Coordination: Coordination normal.  Psychiatric:        Behavior: Behavior normal.     (all labs ordered are listed, but only abnormal results are displayed) Labs Reviewed  CBC WITH DIFFERENTIAL/PLATELET - Abnormal; Notable for the following components:      Result Value   RBC 3.71 (*)    Hemoglobin 9.5 (*)    HCT 30.0 (*)    MCH 25.6 (*)    RDW 16.8 (*)    Platelets 123 (*)    Lymphs Abs 0.4 (*)    All other components within normal limits  COMPREHENSIVE METABOLIC PANEL WITH GFR  URINALYSIS, ROUTINE W REFLEX MICROSCOPIC    EKG: None  Radiology: No results found.   Procedures   Medications Ordered in the ED - No data to display Patient with cirrhosis.  Patient has worsening edema from the cirrhosis that is surrounding his penis and scrotum.  He has a Foley in place.  Patient was supposed to get a paracentesis today.  We have arranged for him  to get the paracentesis here today.  After his paracentesis he will be discharged home on Cipro  for his urinary tract infection and follow-up with his PCP.  Final disposition will be determined by my colleague Dr. Havlin after his paracentesis is finished                                 Medical Decision Making Amount and/or Complexity of Data Reviewed Labs: ordered.  Risk Prescription drug management.   Patient with severe cirrhosis.  Patient getting paracentesis today in the emergency department and he also had a urinary tract infection that he will be discharged on Cipro  with.  He is supposed to follow-up with his PCP     Final diagnoses:  Alcoholic cirrhosis of liver with ascites [facility])    ED Discharge Orders     None          Suzette Pac, MD 03/26/24 1616

## 2024-03-27 ENCOUNTER — Other Ambulatory Visit (HOSPITAL_COMMUNITY): Payer: Self-pay

## 2024-03-28 LAB — URINE CULTURE: Culture: >=100000 — AB

## 2024-03-29 ENCOUNTER — Emergency Department (HOSPITAL_COMMUNITY)
Admission: EM | Admit: 2024-03-29 | Discharge: 2024-03-29 | Disposition: A | Discharge status: Home / Self Care | Payer: Medicare (Managed Care) | Attending: Emergency Medicine | Admitting: Emergency Medicine

## 2024-03-29 ENCOUNTER — Other Ambulatory Visit (HOSPITAL_COMMUNITY): Payer: Self-pay

## 2024-03-29 ENCOUNTER — Encounter (HOSPITAL_COMMUNITY): Payer: Self-pay | Admitting: *Deleted

## 2024-03-29 ENCOUNTER — Other Ambulatory Visit: Payer: Self-pay

## 2024-03-29 ENCOUNTER — Telehealth (HOSPITAL_BASED_OUTPATIENT_CLINIC_OR_DEPARTMENT_OTHER): Payer: Self-pay | Admitting: *Deleted

## 2024-03-29 DIAGNOSIS — J45909 Unspecified asthma, uncomplicated: Secondary | ICD-10-CM | POA: Diagnosis not present

## 2024-03-29 DIAGNOSIS — B356 Tinea cruris: Secondary | ICD-10-CM | POA: Diagnosis not present

## 2024-03-29 DIAGNOSIS — I130 Hypertensive heart and chronic kidney disease with heart failure and stage 1 through stage 4 chronic kidney disease, or unspecified chronic kidney disease: Secondary | ICD-10-CM | POA: Diagnosis not present

## 2024-03-29 DIAGNOSIS — N183 Chronic kidney disease, stage 3 unspecified: Secondary | ICD-10-CM | POA: Diagnosis not present

## 2024-03-29 DIAGNOSIS — N5089 Other specified disorders of the male genital organs: Secondary | ICD-10-CM | POA: Diagnosis present

## 2024-03-29 DIAGNOSIS — I509 Heart failure, unspecified: Secondary | ICD-10-CM | POA: Diagnosis not present

## 2024-03-29 DIAGNOSIS — I2510 Atherosclerotic heart disease of native coronary artery without angina pectoris: Secondary | ICD-10-CM | POA: Diagnosis not present

## 2024-03-29 DIAGNOSIS — Z85528 Personal history of other malignant neoplasm of kidney: Secondary | ICD-10-CM | POA: Diagnosis not present

## 2024-03-29 DIAGNOSIS — Z79899 Other long term (current) drug therapy: Secondary | ICD-10-CM | POA: Diagnosis not present

## 2024-03-29 LAB — COMPREHENSIVE METABOLIC PANEL WITH GFR
ALT: 13 U/L (ref 0–44)
AST: 28 U/L (ref 15–41)
Albumin: 2.7 g/dL — ABNORMAL LOW (ref 3.5–5.0)
Alkaline Phosphatase: 64 U/L (ref 38–126)
Anion gap: 9 (ref 5–15)
BUN: 21 mg/dL (ref 8–23)
CO2: 22 mmol/L (ref 22–32)
Calcium: 8.6 mg/dL — ABNORMAL LOW (ref 8.9–10.3)
Chloride: 107 mmol/L (ref 98–111)
Creatinine, Ser: 1.71 mg/dL — ABNORMAL HIGH (ref 0.61–1.24)
GFR, Estimated: 43 mL/min — ABNORMAL LOW (ref >60)
Glucose, Bld: 97 mg/dL (ref 70–99)
Potassium: 5 mmol/L (ref 3.5–5.1)
Sodium: 138 mmol/L (ref 135–145)
Total Bilirubin: 1.1 mg/dL (ref 0.0–1.2)
Total Protein: 6.6 g/dL (ref 6.5–8.1)

## 2024-03-29 LAB — I-STAT CG4 LACTIC ACID, ED: Lactic Acid, Venous: 1.2 mmol/L (ref 0.5–1.9)

## 2024-03-29 LAB — CBC WITH DIFFERENTIAL/PLATELET
Abs Immature Granulocytes: 0.02 K/uL (ref 0.00–0.07)
Basophils Absolute: 0 K/uL (ref 0.0–0.1)
Basophils Relative: 1 %
Eosinophils Absolute: 0.2 K/uL (ref 0.0–0.5)
Eosinophils Relative: 3 %
HCT: 33.4 % — ABNORMAL LOW (ref 39.0–52.0)
Hemoglobin: 10.3 g/dL — ABNORMAL LOW (ref 13.0–17.0)
Immature Granulocytes: 0 %
Lymphocytes Relative: 10 %
Lymphs Abs: 0.5 K/uL — ABNORMAL LOW (ref 0.7–4.0)
MCH: 25.2 pg — ABNORMAL LOW (ref 26.0–34.0)
MCHC: 30.8 g/dL (ref 30.0–36.0)
MCV: 81.9 fL (ref 80.0–100.0)
Monocytes Absolute: 0.7 K/uL (ref 0.1–1.0)
Monocytes Relative: 13 %
Neutro Abs: 4.1 K/uL (ref 1.7–7.7)
Neutrophils Relative %: 73 %
Platelets: 143 K/uL — ABNORMAL LOW (ref 150–400)
RBC: 4.08 MIL/uL — ABNORMAL LOW (ref 4.22–5.81)
RDW: 16.8 % — ABNORMAL HIGH (ref 11.5–15.5)
WBC: 5.6 K/uL (ref 4.0–10.5)
nRBC: 0 % (ref 0.0–0.2)

## 2024-03-29 LAB — URINALYSIS, ROUTINE W REFLEX MICROSCOPIC
Bilirubin Urine: NEGATIVE
Glucose, UA: NEGATIVE mg/dL
Ketones, ur: NEGATIVE mg/dL
Nitrite: NEGATIVE
Protein, ur: NEGATIVE mg/dL
Specific Gravity, Urine: 1.015 (ref 1.005–1.030)
pH: 5.5 (ref 5.0–8.0)

## 2024-03-29 LAB — URINALYSIS, MICROSCOPIC (REFLEX): RBC / HPF: >50 RBC/hpf (ref 0–5)

## 2024-03-29 MED ORDER — CEPHALEXIN 500 MG PO CAPS
500.0000 mg | ORAL_CAPSULE | Freq: Two times a day (BID) | ORAL | 0 refills | Status: AC
  Filled 2024-03-29: qty 14, 7d supply, fill #0

## 2024-03-29 MED ORDER — FLUCONAZOLE 50 MG PO TABS
75.0000 mg | ORAL_TABLET | ORAL | 0 refills | Status: DC
  Filled 2024-03-29: qty 6, 28d supply, fill #0

## 2024-03-29 MED ORDER — OXYCODONE HCL 5 MG PO TABS
2.5000 mg | ORAL_TABLET | ORAL | 0 refills | Status: AC | PRN
  Filled 2024-03-29: qty 8, 3d supply, fill #0

## 2024-03-29 MED ORDER — FLUCONAZOLE 150 MG PO TABS
150.0000 mg | ORAL_TABLET | Freq: Once | ORAL | Status: AC
  Administered 2024-03-29: 150 mg via ORAL
  Filled 2024-03-29: qty 1

## 2024-03-29 MED ORDER — FLUCONAZOLE 150 MG PO TABS
75.0000 mg | ORAL_TABLET | ORAL | 0 refills | Status: AC
  Filled 2024-03-29: qty 2, 28d supply, fill #0

## 2024-03-29 NOTE — ED Notes (Signed)
 Foley bag switched to leg bag. Getting self dressed. Alert, NAD, calm, interactive. Denies questions or needs. Rx and close f/u encouraged. Out in w/c. Pt calling [facility] for ride home.

## 2024-03-29 NOTE — ED Provider Notes (Signed)
 [facility] Provider Note   CSN: [id number] Arrival date & time: 03/29/24  9461     Patient presents with: Groin Swelling (Testicular Swelling)   John [name] is a 68 y.o. male.   HPI    68 year old male with history of alcoholic cirrhosis, portal hypertension, recurrent ascites, esophageal varices persistent atrial fibrillation on anticoagulation, chronic heart failure with a preserved ejection fraction, CKD stage III, hypertension, hyperlipidemia, iron deficiency anemia, chronic thrombocytopenia, renal cell carcinoma, foley catheter placed 11/23 for urinary retention/phimosis who presents with concern for scrotal swelling and pain.  Reports it has been going on for about 2 weeks. He has the catheter in place which he does not like but understands. He has been taking his medications as prescribed-reports home health had come by and encouraged him to do this and has been adherent .  The swelling has been present over the last 2 weeks, it hurts to sit on his scrotum, hurts to sit and walk. Just feels like they are full and it is uncomfortable. He had put on some ointment. He used to have pain medications regularly for his back but this stopped a month or two ago. He is not having abdominal pain, no nausea, no vomiting, no fevers at home, no chest pain or shortness of breath.  Past Medical History:  Diagnosis Date   Allergy    Arthritis    oa back   Asthma    CAP (community acquired pneumonia) 05/23/2015   Cataract    both eyes   CHF (congestive heart failure) (HCC) 2001   sees primary for   Chronic back pain    Chronic kidney disease    ckd 3   Cirrhosis (HCC)    alcoholic with h/o varices, ascites   Coronary artery disease    nonobstrucrtibe   ETOH abuse    GERD (gastroesophageal reflux disease)    Gout    Hepatitis    hepatitic c 2018 24 week tx with ecuplipsa, no hepatitic c detected after tx   History of blood  transfusion 8-9- yrs ago   Hypertension    Optic neuropathy, left    PAD (peripheral artery disease)    slight  primary manages   Polysubstance abuse (HCC)    Primary hypertension    Renal cell carcinoma (HCC) 2016, 2018 and 2022   left ablation   Seizures [facility])    age 69 none since   Sickle cell trait    Stroke (HCC) 1998   light no problems with   Uses walker 12/04/2020   Wears dentures    Wears glasses      Prior to Admission medications   Medication Sig Start Date End Date Taking? Authorizing Provider  cephALEXin  (KEFLEX ) 500 MG capsule Take 1 capsule (500 mg total) by mouth 2 (two) times daily for 7 days. 03/29/24 04/05/24 Yes Dreama Longs, MD  oxyCODONE  (ROXICODONE ) 5 MG immediate release tablet Take 0.5 tablets (2.5 mg total) by mouth every 4 (four) hours as needed for severe pain (pain score 7-10) or breakthrough pain. 03/29/24  Yes Dreama Longs, MD  albuterol  (PROAIR  HFA) 108 (90 Base) MCG/ACT inhaler Inhale 2 puffs into the lungs every 6 (six) hours as needed for wheezing or shortness of breath. 12/11/23   Rizwan, Saima, MD  allopurinol  (ZYLOPRIM ) 100 MG tablet Take 0.5 tablets (50 mg total) by mouth daily. 12/11/23 03/10/24  Rizwan, Saima, MD  atorvastatin  (LIPITOR) 40 MG tablet Take 1 tablet (  40 mg total) by mouth at bedtime. 12/11/23 03/10/24  Rizwan, Saima, MD  beclomethasone (QVAR  REDIHALER) 80 MCG/ACT inhaler Inhale 1 puff into the lungs 2 (two) times daily. 12/11/23   Rizwan, Saima, MD  brimonidine  (ALPHAGAN ) 0.2 % ophthalmic solution Place 1 drop into both eyes 2 (two) times daily. 12/11/23   Rizwan, Saima, MD  ciprofloxacin  (CIPRO ) 500 MG tablet Take 1 tablet (500 mg total) by mouth 2 (two) times daily for 7 days. 03/26/24 04/03/24  Suzette Pac, MD  FEROSUL 325 (65 Fe) MG tablet Take 1 tablet (325 mg total) by mouth every Monday, Wednesday, and Friday. 12/12/23 03/11/24  Rizwan, Saima, MD  fluconazole (DIFLUCAN) 150 MG tablet Take 0.5 tablets (75 mg total) by mouth  once a week for 3 doses. 04/05/24 04/26/24  Dreama Longs, MD  furosemide  (LASIX ) 40 MG tablet Take 2 tablets (80 mg total) by mouth daily for 5 days. 03/21/24 03/26/24  Myriam Dorn BROCKS, PA  ketoconazole  (NIZORAL ) 2 % shampoo Apply topically to shampoo the scalp 2 times a week 12/11/23   Rizwan, Saima, MD  lactulose  (CHRONULAC ) 10 GM/15ML solution Take 15 mLs (10 g total) by mouth 2 (two) times daily as needed for mild constipation. 12/11/23   Rizwan, Saima, MD  Menthol -Methyl Salicylate  (SALONPAS  PAIN RELIEF  PATCH) PTCH Apply 1 patch topically daily as needed (for mid-back pain and lower back pain). 12/11/23   Rizwan, Saima, MD  metoprolol  succinate (TOPROL  XL) 25 MG 24 hr tablet Take 1 tablet (25 mg total) by mouth at bedtime. 12/11/23 03/10/24  Rizwan, Saima, MD  mupirocin  cream (BACTROBAN ) 2 % Apply 1 Application topically 2 (two) times daily. 03/21/24   Myriam Dorn BROCKS, PA  omeprazole  (PRILOSEC) 40 MG capsule Take 1 capsule (40 mg total) by mouth daily before breakfast. 12/11/23 03/10/24  Earley Saucer, MD  potassium chloride  (KLOR-CON ) 10 MEQ tablet Take 1 tablet (10 mEq total) by mouth daily. 12/11/23 03/10/24  Rizwan, Saima, MD  spironolactone  (ALDACTONE ) 50 MG tablet Take 0.5 tablets (25 mg total) by mouth daily for 60 doses. 12/11/23 02/09/24  Rizwan, Saima, MD    Allergies: Penicillins    Review of Systems  Updated Vital Signs BP 110/89 (BP Location: Left Arm)   Pulse 100   Temp 98.6 F (37 C) (Oral)   Resp 20   Ht 5' 7 (1.702 m)   Wt 95.3 kg   BMI 32.89 kg/m   Physical Exam Vitals and nursing note reviewed.  Constitutional:      General: He is not in acute distress.    Appearance: Normal appearance. He is not ill-appearing, toxic-appearing or diaphoretic.  HENT:     Head: Normocephalic.  Eyes:     Conjunctiva/sclera: Conjunctivae normal.  Cardiovascular:     Rate and Rhythm: Normal rate and regular rhythm.     Pulses: Normal pulses.  Pulmonary:     Effort:  Pulmonary effort is normal. No respiratory distress.  Abdominal:     Tenderness: There is no abdominal tenderness. There is no guarding.  Genitourinary:    Comments: Scrotal and penile edema. Foley catheter in place Tenderness diffusely to scrotum, ulceration right scrotum lateral side, inguinal folds with more brown coloring, slight erythema, odor, area of dry skin to scrotum, no bullae, no crepitus Musculoskeletal:        General: No deformity or signs of injury.     Cervical back: No rigidity.  Skin:    General: Skin is warm and dry.     Coloration: Skin  is not jaundiced or pale.  Neurological:     General: No focal deficit present.     Mental Status: He is alert and oriented to person, place, and time.     (all labs ordered are listed, but only abnormal results are displayed) Labs Reviewed  URINALYSIS, ROUTINE W REFLEX MICROSCOPIC - Abnormal; Notable for the following components:      Result Value   APPearance HAZY (*)    Hgb urine dipstick LARGE (*)    Leukocytes,Ua SMALL (*)    All other components within normal limits  CBC WITH DIFFERENTIAL/PLATELET - Abnormal; Notable for the following components:   RBC 4.08 (*)    Hemoglobin 10.3 (*)    HCT 33.4 (*)    MCH 25.2 (*)    RDW 16.8 (*)    Platelets 143 (*)    Lymphs Abs 0.5 (*)    All other components within normal limits  COMPREHENSIVE METABOLIC PANEL WITH GFR - Abnormal; Notable for the following components:   Creatinine, Ser 1.71 (*)    Calcium  8.6 (*)    Albumin  2.7 (*)    GFR, Estimated 43 (*)    All other components within normal limits  URINALYSIS, MICROSCOPIC (REFLEX) - Abnormal; Notable for the following components:   Bacteria, UA RARE (*)    All other components within normal limits  I-STAT CG4 LACTIC ACID, ED  I-STAT CG4 LACTIC ACID, ED    EKG: None  Radiology: No results found.   Procedures   Medications Ordered in the ED  fluconazole (DIFLUCAN) tablet 150 mg (has no administration in time  range)                                     68 year old male with history of alcoholic cirrhosis, portal hypertension, recurrent ascites, esophageal varices persistent atrial fibrillation on anticoagulation, chronic heart failure with a preserved ejection fraction, CKD stage III, hypertension, hyperlipidemia, iron deficiency anemia, chronic thrombocytopenia, renal cell carcinoma, foley catheter placed 11/23 for urinary retention/phimosis who presents with concern for scrotal swelling and pain.  By history and exam have low suspicion for testicular torsion, epididymitis, scrotal abscess.  Presentation is not consistent with Fournier's gangrene.   Labs show no leukocytosis, normal lactic acid,. CMP stable in comparison to prior. UA from foley with 11-20WBC.   Has BP chronically lower side due to cirrhosis, not significantly changed today. He is stable for outpatient treatment.   Rash with fungal appearance which is likely contributing to his discomfort. Does not have significant swelling or signs of volume overload to indicate need for inpatient diuresis--recommend continuing his lasix  as rx.  Will give fluconazole in ED and then once weekly dosing 75mg  for tinea and candidasis.  It is difficult to rule out element of bacterial cellulitis (or uti from foley sample) will treat also with keflex .  Due to pain from area of ulceration, limited options in setting of cirrhosis/ckd, did give small rx for oxycodone  after review of risks and review in [place] drug database.   Discussed strict return precautions, recommend continued PCP follow up.      Final diagnoses:  Fungal infection of the groin  Scrotal edema    ED Discharge Orders          Ordered    fluconazole (DIFLUCAN) 150 MG tablet  Weekly        03/29/24 0814    fluconazole (DIFLUCAN) 50 MG  tablet  Weekly,   Status:  Discontinued        03/29/24 0812    oxyCODONE  (ROXICODONE ) 5 MG immediate release tablet  Every 4 hours PRN        03/29/24  0812    cephALEXin  (KEFLEX ) 500 MG capsule  2 times daily        03/29/24 9185               Dreama Longs, MD 03/29/24 [phone]

## 2024-03-29 NOTE — Telephone Encounter (Signed)
 Post ED Visit - Positive Culture Follow-up  Culture report reviewed by antimicrobial stewardship pharmacist: [name] []  [name], Pharm.D. []  [name], Pharm.D., BCPS AQ-ID []  [name], Pharm.D., BCPS []  [name], Pharm.D., BCPS []  [place], [name], BCPS, AAHIVP []  [name], Pharm.D., BCPS, AAHIVP []  [name], PharmD, BCPS []  [name], PharmD, BCPS []  [name], PharmD, BCPS []  [name], PharmD []  [name], PharmD, BCPS []  [name], PharmD  [name] []  [name], PharmD []  [name], PharmD []  [name], PharmD []  [name], Rph []  [name], PharmD []  [name], PharmD []  [name], PharmD []  [name], PharmD []  [name], PharmD []  [name], PharmD []  [name], PharmD []  [name], PharmD [x]  [name], PharmD   Positive urine culture Treated with Ciprofloxacin , organism sensitive to the same and no further patient follow-up is required at this time.  John [name] 03/29/2024, 9:14 AM

## 2024-03-29 NOTE — ED Triage Notes (Signed)
 Pt bib by [facility] from home for genital swelling x2 weeks. Pt unable to walk as of 2 hrs ago d/t swilling and pain. EMS noted distended ABD.   BP: 98 Palpated  HR: 70 O2: 96% RA  T: 100.8  CBG 136

## 2024-03-30 ENCOUNTER — Other Ambulatory Visit (HOSPITAL_COMMUNITY): Payer: Self-pay

## 2024-04-02 ENCOUNTER — Inpatient Hospital Stay (HOSPITAL_COMMUNITY)
Admission: RE | Admit: 2024-04-02 | Discharge: 2024-04-02 | Disposition: A | Payer: Medicare (Managed Care) | Source: Ambulatory Visit | Attending: Nurse Practitioner | Admitting: Nurse Practitioner

## 2024-04-02 DIAGNOSIS — K7031 Alcoholic cirrhosis of liver with ascites: Secondary | ICD-10-CM

## 2024-04-02 HISTORY — PX: IR PARACENTESIS: IMG2679

## 2024-04-02 MED ORDER — LIDOCAINE-EPINEPHRINE 1 %-1:100000 IJ SOLN
INTRAMUSCULAR | Status: AC
  Filled 2024-04-02: qty 1

## 2024-04-02 MED ORDER — LIDOCAINE-EPINEPHRINE 1 %-1:100000 IJ SOLN
20.0000 mL | Freq: Once | INTRAMUSCULAR | Status: AC
  Administered 2024-04-02: 10 mL via INTRADERMAL

## 2024-04-02 NOTE — Procedures (Signed)
 PROCEDURE SUMMARY:  Successful image-guided paracentesis from the right  abdomen.  Yielded 5.9 liters of clear, yellow fluid.  No immediate complications.  EBL: zero Patient tolerated well.   Specimen not sent for labs.  Please see imaging section of Epic for full dictation.  Beila Purdie NP 04/02/2024 3:17 PM

## 2024-04-09 ENCOUNTER — Other Ambulatory Visit (HOSPITAL_COMMUNITY): Payer: Self-pay | Admitting: Interventional Radiology

## 2024-04-09 ENCOUNTER — Other Ambulatory Visit (HOSPITAL_COMMUNITY): Payer: Self-pay | Admitting: Nurse Practitioner

## 2024-04-09 ENCOUNTER — Ambulatory Visit (HOSPITAL_COMMUNITY)
Admission: RE | Admit: 2024-04-09 | Discharge: 2024-04-09 | Disposition: A | Payer: Medicare (Managed Care) | Source: Ambulatory Visit | Attending: Nurse Practitioner | Admitting: Nurse Practitioner

## 2024-04-09 DIAGNOSIS — K7031 Alcoholic cirrhosis of liver with ascites: Secondary | ICD-10-CM

## 2024-04-09 HISTORY — PX: IR PARACENTESIS: IMG2679

## 2024-04-09 MED ORDER — LIDOCAINE-EPINEPHRINE 1 %-1:100000 IJ SOLN
20.0000 mL | Freq: Once | INTRAMUSCULAR | Status: AC
  Administered 2024-04-09: 10 mL

## 2024-04-09 MED FILL — Lidocaine Inj 1% w/ Epinephrine-1:100000: INTRAMUSCULAR | Qty: 1 | Status: AC

## 2024-04-09 NOTE — Procedures (Signed)
 Ultrasound-guided therapeutic paracentesis performed yielding 7.2 liters of straw  colored fluid.  No immediate complications. EBL is none.

## 2024-04-13 ENCOUNTER — Telehealth (HOSPITAL_COMMUNITY): Payer: Self-pay | Admitting: Radiology

## 2024-04-13 NOTE — Telephone Encounter (Signed)
 Called [place] with [facility] to schedule TIPS with Dr. Jennefer on 05/07/24. Left VM for her to call me back. JM

## 2024-04-13 NOTE — Telephone Encounter (Signed)
 Called pt's niece, [name]. Left VM for her to call back to schedule John [name] for his TIPS procedure with Dr. Jennefer on 05/07/24. JM

## 2024-04-15 ENCOUNTER — Ambulatory Visit: Payer: Medicare (Managed Care) | Admitting: Physician Assistant

## 2024-04-15 NOTE — Progress Notes (Deleted)
 Chief Complaint: Follow-up cirrhosis  HPI:    John [name] is a 68 year old African-American male, previously known to Dr. Aneita and recently seen by Dr. Wilhelmenia with a past medical history as listed below including CHF and CKD polysubstance abuse and alcoholic cirrhosis, who was referred to me by Cloria Annabella CROME, DO for a complaint of alcoholic cirrhosis.       05/09/2023 ultrasound-guided paracentesis with 5 point liters of hazy, amber fluid removed.    05/10/2023 echo for A-fib with an LVEF of 55-60% with mild left ventricular hypertrophy and aortic dilation.    05/12/2023 ultrasound-guided paracentesis with 5 L of fluid removed.    05/13/2023 patient seen in the hospital by our service with decompensated alcoholic cirrhosis and fluid retention.  Time of that admission patient discussed that he had quit drinking greater than 10 years ago but continued to have a cocaine habit.  He had been out of a diuretic for several months and the swelling had gotten worse to the point where it was painful in his legs and abdomen.  That admission MELD-NA was 20.  Was noted the patient was overdue for a colonoscopy and EGD.  Patient started back on Spironolactone  50 mg daily and Lasix  40 mg daily.    05/14/2023 patient wanted to be discharged.  Discussed that he would need an EGD and colonoscopy outpatient but in the hospital setting.    06/30/2023-07/08/2023 patient admitted to the hospital for acute on chronic diastolic CHF and decompensated cirrhosis with ascites.  He underwent paracentesis that ruled out SBP, 3.5 L removed.  Transition to oral Lasix  and spironolactone .  Creatinine remained stable.    07/07/2023 BMP sodium 133, creatinine 2.12.  CBC with a hemoglobin 8.9 (around baseline).    07/10/2023 office visit with me at that time not a great historian.  Had not been on diuretics.  At that time ordered an ultrasound-guided paracentesis refilled Lasix  40 and Spironolactone  50, recheck CMP in 1 to 2 weeks.  Went  ahead and schedule the patient for an EGD and colonoscopy at the hospital with Dr. Wilhelmenia.  (Patient never showed).    10/20/2023 paracentesis with 3 L of fluid removed.    It looks like patient has required paracentesis every 2 weeks for the past month and a half, scheduled for a TIPS in January with IR.  GI history: 12/26/2021 office visit with Dr. Aneita for follow-up of decompensated cirrhosis: At that time had been in the ED for visit with lower extremity edema on 7/18, CT showed bilateral flank edema with perirectal edema and small bilateral pleural effusions; plan: At that time diagnosed decompensated cirrhosis secondary to hepatitis C and alcohol portal gastropathy, esophageal varices and bleeding gastric varices treated with BRTO and EGD, also CHF with volume overload, GERD with LA grade a esophagitis and an esophageal stricture and a personal history of tubular adenoma and serrated adenoma in November 2019 11/13/2021 CTAP with cirrhosis and portal hypertension manifesting as splenomegaly and distal esophageal varices, increased bilateral flank edema, stable small umbilical hernia containing small bowel and aortic atherosclerosis 06/27/2020 EGD with grade 1 esophageal varices, benign-appearing esophageal stenosis, LA grade a reflux esophagitis, portal hypertensive gastropathy, gastric varices without bleeding a small hiatal hernia-recommended repeat EGD in 1 to 2 years for surveillance 03/02/2018 colonoscopy with one 5 mm polyp in transverse colon, four 7-8 mm polyps in the sigmoid colon, descending colon and transverse colon, lipoma in the transverse colon internal hemorrhoids-pathology showed tubular adenoma and serrated adenoma-repeat recommended  in 3 years  Past Medical History:  Diagnosis Date   Allergy    Arthritis    oa back   Asthma    CAP (community acquired pneumonia) 05/23/2015   Cataract    both eyes   CHF (congestive heart failure) (HCC) 2001   sees primary for   Chronic back  pain    Chronic kidney disease    ckd 3   Cirrhosis (HCC)    alcoholic with h/o varices, ascites   Coronary artery disease    nonobstrucrtibe   ETOH abuse    GERD (gastroesophageal reflux disease)    Gout    Hepatitis    hepatitic c 2018 24 week tx with ecuplipsa, no hepatitic c detected after tx   History of blood transfusion 8-9- yrs ago   Hypertension    Optic neuropathy, left    PAD (peripheral artery disease)    slight  primary manages   Polysubstance abuse (HCC)    Primary hypertension    Renal cell carcinoma (HCC) 2016, 2018 and 2022   left ablation   Seizures [facility])    age 55 none since   Sickle cell trait    Stroke (HCC) 1998   light no problems with   Uses walker 12/04/2020   Wears dentures    Wears glasses     Past Surgical History:  Procedure Laterality Date   ARTHRODESIS METATARSALPHALANGEAL JOINT (MTPJ) Right 12/07/2020   Procedure: ARTHRODESIS METATARSALPHALANGEAL JOINT (MTPJ);  Surgeon: Janit Thresa HERO, DPM;  Location: [facility];  Service: Podiatry;  Laterality: Right;   COLONOSCOPY  2021   ESOPHAGOGASTRODUODENOSCOPY N/A 08/14/2014   Procedure: ESOPHAGOGASTRODUODENOSCOPY (EGD);  Surgeon: Gwendlyn ONEIDA Buddy, MD;  Location: [facility];  Service: Endoscopy;  Laterality: N/A;   ESOPHAGOGASTRODUODENOSCOPY (EGD) WITH PROPOFOL  N/A 06/03/2017   Procedure: ESOPHAGOGASTRODUODENOSCOPY (EGD) WITH PROPOFOL ;  Surgeon: Buddy Gwendlyn ONEIDA, MD;  Location: [facility];  Service: Endoscopy;  Laterality: N/A;   HALLUX FUSION Left 07/28/2020   Procedure: HALLUX FUSION MPJ;  Surgeon: Janit Thresa HERO, DPM;  Location: [facility];  Service: Podiatry;  Laterality: Left;   HAMMER TOE SURGERY Left 07/28/2020   Procedure: HAMMER TOE CORRECTION  2,3,AND4 LEFT FOOT;  Surgeon: Janit Thresa HERO, DPM;  Location: [facility];  Service: Podiatry;  Laterality: Left;   HAMMER TOE SURGERY Right 12/07/2020   Procedure: HAMMER TOE CORRECTION  2-4;  Surgeon:  Janit Thresa HERO, DPM;  Location: [facility] ;  Service: Podiatry;  Laterality: Right;   IR PARACENTESIS  09/05/2023   IR PARACENTESIS  09/16/2023   IR PARACENTESIS  12/16/2023   IR PARACENTESIS  12/24/2023   IR PARACENTESIS  12/31/2023   IR PARACENTESIS  01/14/2024   IR PARACENTESIS  01/22/2024   IR PARACENTESIS  02/03/2024   IR PARACENTESIS  02/20/2024   IR PARACENTESIS  02/27/2024   IR PARACENTESIS  02/13/2024   IR PARACENTESIS  03/05/2024   IR PARACENTESIS  03/12/2024   IR PARACENTESIS  03/19/2024   IR PARACENTESIS  04/02/2024   IR PARACENTESIS  04/09/2024   IR RADIOLOGIST EVAL & MGMT  09/25/2016   IR RADIOLOGIST EVAL & MGMT  12/10/2016   IR RADIOLOGIST EVAL & MGMT  03/25/2018   IR RADIOLOGIST EVAL & MGMT  03/24/2019   IR RADIOLOGIST EVAL & MGMT  05/17/2020   IR RADIOLOGIST EVAL & MGMT  08/03/2020   IR RADIOLOGIST EVAL & MGMT  11/22/2020   IR RADIOLOGIST EVAL & MGMT  05/31/2021  IR RADIOLOGIST EVAL & MGMT  02/16/2024   METATARSAL HEAD EXCISION Left 07/28/2020   Procedure: METATARSAL HEAD EXCISION TOES 2,3,AND 4  LEFT FOOT;  Surgeon: Janit Thresa HERO, DPM;  Location: [facility];  Service: Podiatry;  Laterality: Left;   METATARSAL OSTEOTOMY Right 12/07/2020   Procedure: METATARSAL OSTEOTOMY TOES 2-4 RIGHT FOOT;  Surgeon: Janit Thresa HERO, DPM;  Location: [facility];  Service: Podiatry;  Laterality: Right;   none     RADIOFREQUENCY ABLATION Left 11/08/2016   Procedure: LEFT RENAL CRYOABLATION;  Surgeon: Vanice Sharper, MD;  Location: [facility];  Service: Anesthesiology;  Laterality: Left;   RADIOLOGY WITH ANESTHESIA N/A 08/15/2014   Procedure: RADIOLOGY WITH ANESTHESIA;  Surgeon: Sharper Vanice, MD;  Location: [facility];  Service: Radiology;  Laterality: N/A;   RADIOLOGY WITH ANESTHESIA Left 07/05/2020   Procedure: CT CRYOABLATION;  Surgeon: Vanice Sharper, MD;  Location: [facility];  Service: Anesthesiology;  Laterality: Left;   UPPER GASTROINTESTINAL ENDOSCOPY       Current Outpatient Medications  Medication Sig Dispense Refill   albuterol  (PROAIR  HFA) 108 (90 Base) MCG/ACT inhaler Inhale 2 puffs into the lungs every 6 (six) hours as needed for wheezing or shortness of breath. 6.7 g 4   allopurinol  (ZYLOPRIM ) 100 MG tablet Take 0.5 tablets (50 mg total) by mouth daily. 45 tablet 0   atorvastatin  (LIPITOR) 40 MG tablet Take 1 tablet (40 mg total) by mouth at bedtime. 90 tablet 0   beclomethasone (QVAR  REDIHALER) 80 MCG/ACT inhaler Inhale 1 puff into the lungs 2 (two) times daily. 10.6 g 12   brimonidine  (ALPHAGAN ) 0.2 % ophthalmic solution Place 1 drop into both eyes 2 (two) times daily. 5 mL 12   FEROSUL 325 (65 Fe) MG tablet Take 1 tablet (325 mg total) by mouth every Monday, Wednesday, and Friday. 36 tablet 0   fluconazole  (DIFLUCAN ) 150 MG tablet Take 0.5 tablets (75 mg total) by mouth once a week for 3 doses. 2 tablet 0   furosemide  (LASIX ) 40 MG tablet Take 2 tablets (80 mg total) by mouth daily for 5 days. 90 tablet 2   ketoconazole  (NIZORAL ) 2 % shampoo Apply topically to shampoo the scalp 2 times a week 120 mL 0   lactulose  (CHRONULAC ) 10 GM/15ML solution Take 15 mLs (10 g total) by mouth 2 (two) times daily as needed for mild constipation. 236 mL 0   Menthol -Methyl Salicylate  (SALONPAS  PAIN RELIEF  PATCH) PTCH Apply 1 patch topically daily as needed (for mid-back pain and lower back pain).     metoprolol  succinate (TOPROL  XL) 25 MG 24 hr tablet Take 1 tablet (25 mg total) by mouth at bedtime. 90 tablet 0   mupirocin  cream (BACTROBAN ) 2 % Apply 1 Application topically 2 (two) times daily. 15 g 0   omeprazole  (PRILOSEC) 40 MG capsule Take 1 capsule (40 mg total) by mouth daily before breakfast. 90 capsule 0   oxyCODONE  (ROXICODONE ) 5 MG immediate release tablet Take 0.5 tablets (2.5 mg total) by mouth every 4 (four) hours as needed for severe pain (pain score 7-10) or breakthrough pain. 8 tablet 0   potassium chloride  (KLOR-CON ) 10 MEQ tablet Take 1  tablet (10 mEq total) by mouth daily. 90 tablet 0   spironolactone  (ALDACTONE ) 50 MG tablet Take 0.5 tablets (25 mg total) by mouth daily for 60 doses. 30 tablet 0   No current facility-administered medications for this visit.    Allergies as of 04/15/2024 - Review Complete 04/02/2024  Allergen  Reaction Noted   Penicillins Other (See Comments) 12/16/2012    Family History  Problem Relation Age of Onset   Hypertension Mother        Living   Kidney disease Mother    Diabetes Mother    Heart disease Mother    Hypertension Father        Deceased, 59   Ulcers Father    Stomach cancer Father    Hypertension Brother    Diabetes Brother    Kidney disease Brother    Hypertension Sister    Colon cancer Neg Hx    Esophageal cancer Neg Hx    Rectal cancer Neg Hx     Social History   Socioeconomic History   Marital status: Single    Spouse name: Not on file   Number of children: 0   Years of education: Not on file   Highest education level: Not on file  Occupational History   Occupation: retired  Tobacco Use   Smoking status: Former    Current packs/day: 0.00    Average packs/day: 0.3 packs/day for 20.0 years (6.0 ttl pk-yrs)    Types: Cigarettes    Start date: 04/30/1991    Quit date: 04/30/2011    Years since quitting: 12.9   Smokeless tobacco: Never   Tobacco comments:    2015  Vaping Use   Vaping status: Never Used  Substance and Sexual Activity   Alcohol use: No    Comment: not in 11 years   Drug use: Yes    Types: Cocaine, Marijuana    Comment: cocaine last used 2024 and marijuan last used 2024   Sexual activity: Not Currently  Other Topics Concern   Not on file  Social History Narrative   Lives with niece in a 2 story home.     On disability since 2015 for low back pain.  Used to work environmental manager.    Highest level of education: 11th grade   Social Drivers of Health   Tobacco Use: Medium Risk (03/26/2024)   Patient History    Smoking Tobacco Use:  Former    Smokeless Tobacco Use: Never    Passive Exposure: Not on file  Financial Resource Strain: Not on file  Food Insecurity: Food Insecurity Present (12/11/2023)   Epic    Worried About Programme Researcher, Broadcasting/film/video in the Last Year: Sometimes true    Ran Out of Food in the Last Year: Never true  Transportation Needs: No Transportation Needs (12/11/2023)   Epic    Lack of Transportation (Medical): No    Lack of Transportation (Non-Medical): No  Physical Activity: Not on file  Stress: Not on file  Social Connections: Moderately Integrated (12/10/2023)   Social Connection and Isolation Panel    Frequency of Communication with Friends and Family: Twice a week    Frequency of Social Gatherings with Friends and Family: Once a week    Attends Religious Services: 1 to 4 times per year    Active Member of [employer] or Organizations: No    Attends Banker Meetings: 1 to 4 times per year    Marital Status: Never married  Intimate Partner Violence: Not At Risk (12/11/2023)   Epic    Fear of Current or Ex-Partner: No    Emotionally Abused: No    Physically Abused: No    Sexually Abused: No  Depression (PHQ2-9): Not on file  Alcohol Screen: Not on file  Housing: Low Risk (12/11/2023)   Epic  Unable to Pay for Housing in the Last Year: No    Number of Times Moved in the Last Year: 0    Homeless in the Last Year: No  Utilities: Not At Risk (12/11/2023)   Epic    Threatened with loss of utilities: No  Health Literacy: Not on file    Review of Systems:    Constitutional: No weight loss, fever, chills, weakness or fatigue HEENT: Eyes: No change in vision               Ears, Nose, Throat:  No change in hearing or congestion Skin: No rash or itching Cardiovascular: No chest pain, chest pressure or palpitations   Respiratory: No SOB or cough Gastrointestinal: See HPI and otherwise negative Genitourinary: No dysuria or change in urinary frequency Neurological: No headache, dizziness or  syncope Musculoskeletal: No new muscle or joint pain Hematologic: No bleeding or bruising Psychiatric: No history of depression or anxiety    Physical Exam:  Vital signs: There were no vitals taken for this visit.  Constitutional:   Pleasant Caucasian male appears to be in NAD, Well developed, Well nourished, alert and cooperative Head:  Normocephalic and atraumatic. Eyes:   PEERL, EOMI. No icterus. Conjunctiva pink. Ears:  Normal auditory acuity. Neck:  Supple Throat: Oral cavity and pharynx without inflammation, swelling or lesion.  Respiratory: Respirations even and unlabored. Lungs clear to auscultation bilaterally.   No wheezes, crackles, or rhonchi.  Cardiovascular: Normal S1, S2. No MRG. Regular rate and rhythm. No peripheral edema, cyanosis or pallor.  Gastrointestinal:  Soft, nondistended, nontender. No rebound or guarding. Normal bowel sounds. No appreciable masses or hepatomegaly. Rectal:  Not performed.  Msk:  Symmetrical without gross deformities. Without edema, no deformity or joint abnormality.  Neurologic:  Alert and  oriented x4;  grossly normal neurologically.  Skin:   Dry and intact without significant lesions or rashes. Psychiatric: Oriented to person, place and time. Demonstrates good judgement and reason without abnormal affect or behaviors.  RELEVANT LABS AND IMAGING: CBC    Component Value Date/Time   WBC 5.6 03/29/2024 0556   RBC 4.08 (L) 03/29/2024 0556   HGB 10.3 (L) 03/29/2024 0556   HGB 9.4 (L) 05/23/2023 1018   HCT 33.4 (L) 03/29/2024 0556   HCT 32.5 (L) 05/23/2023 1018   PLT 143 (L) 03/29/2024 0556   PLT 124 (L) 05/23/2023 1018   MCV 81.9 03/29/2024 0556   MCV 83 05/23/2023 1018   MCH 25.2 (L) 03/29/2024 0556   MCHC 30.8 03/29/2024 0556   RDW 16.8 (H) 03/29/2024 0556   RDW 16.7 (H) 05/23/2023 1018   LYMPHSABS 0.5 (L) 03/29/2024 0556   MONOABS 0.7 03/29/2024 0556   EOSABS 0.2 03/29/2024 0556   BASOSABS 0.0 03/29/2024 0556    CMP      Component Value Date/Time   NA 138 03/29/2024 0556   NA 140 05/23/2023 1022   K 5.0 03/29/2024 0556   CL 107 03/29/2024 0556   CO2 22 03/29/2024 0556   GLUCOSE 97 03/29/2024 0556   BUN 21 03/29/2024 0556   BUN 24 05/23/2023 1022   CREATININE 1.71 (H) 03/29/2024 0556   CREATININE 1.79 (H) 12/09/2016 1446   CALCIUM  8.6 (L) 03/29/2024 0556   PROT 6.6 03/29/2024 0556   PROT 7.3 05/23/2023 1018   ALBUMIN  2.7 (L) 03/29/2024 0556   ALBUMIN  4.0 05/23/2023 1018   AST 28 03/29/2024 0556   ALT 13 03/29/2024 0556   ALKPHOS 64 03/29/2024 0556   BILITOT  1.1 03/29/2024 0556   BILITOT 0.8 05/23/2023 1018   GFRNONAA 43 (L) 03/29/2024 0556   GFRNONAA 40 (L) 12/09/2016 1446   GFRAA >60 05/06/2018 0601   GFRAA 47 (L) 12/09/2016 1446    Assessment: 1.  Decompensated alcoholic cirrhosis: With esophageal varices and ascites, last EGD in March 2022, multiple paracentesis due to not being on his diuretics 2.  Continue cocaine use 3.  History of adenomatous polyps: On last colonoscopy in 2019 with repeat recommended in 3 years  Plan: 1. ***     Delon Failing, PA-C [facility] 04/15/2024, 11:56 AM  Cc: Cloria Annabella CROME, DO

## 2024-04-16 ENCOUNTER — Ambulatory Visit (HOSPITAL_COMMUNITY)
Admission: RE | Admit: 2024-04-16 | Discharge: 2024-04-16 | Disposition: A | Discharge status: Home / Self Care | Payer: Medicare (Managed Care) | Source: Ambulatory Visit | Attending: Nurse Practitioner | Admitting: Nurse Practitioner

## 2024-04-16 ENCOUNTER — Emergency Department (HOSPITAL_COMMUNITY)
Admission: EM | Admit: 2024-04-16 | Discharge: 2024-04-16 | Discharge status: Against Medical Advice | Payer: Medicare (Managed Care) | Attending: Nurse Practitioner | Admitting: Nurse Practitioner

## 2024-04-16 ENCOUNTER — Other Ambulatory Visit (HOSPITAL_COMMUNITY): Payer: Medicare (Managed Care)

## 2024-04-16 DIAGNOSIS — K7031 Alcoholic cirrhosis of liver with ascites: Secondary | ICD-10-CM | POA: Diagnosis present

## 2024-04-16 DIAGNOSIS — Z5321 Procedure and treatment not carried out due to patient leaving prior to being seen by health care provider: Secondary | ICD-10-CM | POA: Diagnosis not present

## 2024-04-16 DIAGNOSIS — R140 Abdominal distension (gaseous): Secondary | ICD-10-CM | POA: Insufficient documentation

## 2024-04-16 HISTORY — PX: IR PARACENTESIS: IMG2679

## 2024-04-16 MED ORDER — LIDOCAINE-EPINEPHRINE 1 %-1:100000 IJ SOLN
INTRAMUSCULAR | Status: AC
  Filled 2024-04-16: qty 20

## 2024-04-16 NOTE — ED Triage Notes (Signed)
 Had an appt for paracentesis scheduled today but was inadvertently cancelled and has one scheduled for 2 weeks but needs it now.  Abd is distended. NO resp distress noted at this time. PT came in his wheelchair. Appears to have a foley with leg bag.

## 2024-04-16 NOTE — Procedures (Signed)
 PROCEDURE SUMMARY:  Successful image-guided paracentesis from the left lower abdomen.  Yielded 5.6 liters of yellow fluid.  No immediate complications.  EBL: trace Patient tolerated well.   Specimen not sent for labs.  Please see imaging section of Epic for full dictation.  Kimble DEL Payge Eppes PA-C 04/16/2024 2:41 PM

## 2024-04-16 NOTE — ED Notes (Signed)
"   Pt was able to get an appt for the paracentesis this afternoon so he needed to be taken our of our system.  Had to DC LWBS b/c triage note and VS taken.   "

## 2024-04-21 ENCOUNTER — Other Ambulatory Visit (HOSPITAL_COMMUNITY): Payer: Self-pay

## 2024-04-23 ENCOUNTER — Ambulatory Visit (HOSPITAL_COMMUNITY)
Admission: RE | Admit: 2024-04-23 | Discharge: 2024-04-23 | Disposition: A | Discharge status: Home / Self Care | Payer: Medicare (Managed Care) | Source: Ambulatory Visit | Attending: Nurse Practitioner | Admitting: Nurse Practitioner

## 2024-04-23 DIAGNOSIS — K7031 Alcoholic cirrhosis of liver with ascites: Secondary | ICD-10-CM | POA: Diagnosis present

## 2024-04-23 HISTORY — PX: IR PARACENTESIS: IMG2679

## 2024-04-23 MED ORDER — LIDOCAINE-EPINEPHRINE 1 %-1:100000 IJ SOLN
INTRAMUSCULAR | Status: AC
  Filled 2024-04-23: qty 20

## 2024-04-23 MED ORDER — LIDOCAINE-EPINEPHRINE 1 %-1:100000 IJ SOLN
20.0000 mL | Freq: Once | INTRAMUSCULAR | Status: AC
  Administered 2024-04-23: 10 mL via INTRADERMAL

## 2024-04-23 NOTE — Procedures (Signed)
 PROCEDURE SUMMARY:  Successful image-guided paracentesis from the right upper abdomen.  Yielded 5.7 liters of clear yellow fluid.  No immediate complications.  EBL: zero Patient tolerated well.   Specimen not sent for labs.  Please see imaging section of Epic for full dictation.  Corinda Ammon B Virga Haltiwanger NP 04/23/2024 1:40 PM

## 2024-04-28 ENCOUNTER — Other Ambulatory Visit (HOSPITAL_COMMUNITY): Payer: Self-pay

## 2024-04-28 ENCOUNTER — Encounter (HOSPITAL_COMMUNITY): Payer: Self-pay | Admitting: *Deleted

## 2024-04-28 MED ORDER — LINEZOLID 600 MG PO TABS
600.0000 mg | ORAL_TABLET | Freq: Two times a day (BID) | ORAL | 0 refills | Status: DC
  Filled 2024-04-28: qty 20, 10d supply, fill #0

## 2024-04-30 ENCOUNTER — Ambulatory Visit (HOSPITAL_COMMUNITY)
Admission: RE | Admit: 2024-04-30 | Discharge: 2024-04-30 | Disposition: A | Discharge status: Home / Self Care | Payer: Medicare (Managed Care) | Source: Ambulatory Visit | Attending: Nurse Practitioner | Admitting: Nurse Practitioner

## 2024-04-30 ENCOUNTER — Other Ambulatory Visit (HOSPITAL_COMMUNITY): Payer: Self-pay

## 2024-04-30 DIAGNOSIS — K7031 Alcoholic cirrhosis of liver with ascites: Secondary | ICD-10-CM | POA: Diagnosis present

## 2024-04-30 HISTORY — PX: IR PARACENTESIS: IMG2679

## 2024-04-30 MED ORDER — LIDOCAINE-EPINEPHRINE 1 %-1:100000 IJ SOLN
INTRAMUSCULAR | Status: AC
  Filled 2024-04-30: qty 20

## 2024-04-30 NOTE — Procedures (Signed)
 PROCEDURE SUMMARY:  Successful image-guided paracentesis from the right abdomen.  Yielded 4.8 liters of clear, straw-colored fluid.  No immediate complications.  EBL: zero Patient tolerated well.   Please see imaging section of Epic for full dictation.  Latreece Mochizuki A Jerron Niblack PA-C 04/30/2024 1:30 PM

## 2024-05-05 ENCOUNTER — Encounter (HOSPITAL_COMMUNITY): Payer: Self-pay | Admitting: Interventional Radiology

## 2024-05-05 NOTE — Progress Notes (Signed)
 Anesthesia Chart Review: SAME DAY WORK-UP  Case: [id number] Date/Time: 05/07/24 0745   Procedure: INSERTION, SHUNT, INTRAHEPATIC PORTOSYSTEMIC, TRANSJUGULAR   Anesthesia type: General   Diagnosis: Alcoholic cirrhosis, unspecified whether ascites present (HCC) [K70.30]   Pre-op diagnosis: Alcoholic cirrohsis of the liver   [facility]   Surgeons: Jennefer Ester PARAS, MD       DISCUSSION: Patient is a 69 year old male scheduled for the above procedure.  History includes former smoker (quit 2013), polysubstance abuse (last cocaine, marijuana 2024; no alcohol in 11 years), hepatitis C (treated with Epclusa 2018), alcoholic cirrhosis (s/p BTRO gastric varices 08/15/2014-09/26/2014; grade 1 esophageal varices 06/27/2020, recurrent large volume ascites s/p multiple paracenteses),  HTN, CAD (nonobstructive documented, otherwise no specific details found), chronic diastolic CHF, afib/flutter, CKD (stage 3), left renal cell cancer (s/p ablation 11/08/2016 & 07/05/2020 for local recurrence), CVA (1998), PAD, asthma, sickle cell trait, GERD, left optic neuropathy, gout.    Over the past year he has had several admission related to decompensated cirrhosis. For several months now, he has been requiring paracentesis every 7-10 with 6-10 liters removed. He was referred to IR for TIPS consideration. As of 03/29/2024, H/H 10.3/33.4, PLT 143K, AST 28, ALT 13, albumin  2.7. On 03/19/2024 PT 15.3, INR 1.1. PTT 31 on 12/16/2023.  Foley catheter placed 03/21/2024 for urinary retention/phimosis. Cr 1.38-1.95 since August 2025 in [facility]. Cr 1.71 on 03/29/2024.   Last visit with cardiologist Dr. Raford was on 09/04/2023. He was admitted 04/2023 with afib/flutter and acute HFpEF in setting of cirrhosis. He was not started on anticoagulation due to history of esophageal varices and GI bleeding.  He was diuresed and required paracentesis. At May visit he was significantly volume overloaded. Inconsistent adherence to  medication regimen (including diuretic and metoprolol ), so he was sent for admission for diuresis and paracentesis.   Echo 01/2024 showed LVEF 50 to 55%, no regional wall motion abnormalities, mild concentric LVH, indeterminate LV diastolic parameters, moderate reduced RV systolic function, mildly elevated PASP, estimated RVSP 37.3 mmHg, severely dilated LA, mildly dilated RA, mild MR, mild to moderate TR, trivial AR, aortic root and ascending aorta measured 40 mm.  He has a same-day workup, so labs as indicated and anesthesia team evaluation on the day of surgery.   VS: Ht 5' 7 (1.702 m)   Wt 95.3 kg   BMI 32.91 kg/m  BP Readings from Last 3 Encounters:  04/23/24 (!) 117/92  04/09/24 108/78  04/02/24 108/80   Pulse Readings from Last 3 Encounters:  03/29/24 89  03/26/24 78  03/21/24 95     PROVIDERS: Cloria Annabella CROME, DO is PCP  Raford Annabella, MD is cardiologist Zenaida Katz, MD is HF cardiologist, last visit 06/06/2023. HF GDMT limited by cirrhosis and CKD. As needed HF follow-up planned.    LABS: Most recent lab results in [facility] include: Lab Results  Component Value Date   WBC 5.6 03/29/2024   HGB 10.3 (L) 03/29/2024   HCT 33.4 (L) 03/29/2024   PLT 143 (L) 03/29/2024   GLUCOSE 97 03/29/2024   ALT 13 03/29/2024   AST 28 03/29/2024   NA 138 03/29/2024   K 5.0 03/29/2024   CL 107 03/29/2024   CREATININE 1.71 (H) 03/29/2024   BUN 21 03/29/2024   CO2 22 03/29/2024   TSH 3.089 05/12/2023   INR 1.1 03/19/2024   HGBA1C 5.3 05/08/2023     IMAGES: MR Abd 06/04/2023: IMPRESSION: 1. Unchanged post ablation appearance of the peripheral  superior pole of the left kidney. No evidence of recurrent or metastatic disease in the abdomen. 2. Cirrhosis and portal hypertension. No focal liver lesions or suspicious contrast enhancement. 3. Large volume ascites and anasarca.     EKG: EKG 12/23/2023: Atrial flutter at 64 bpm with predominant 4:1 AV block Nonspecific  intraventricular conduction delay Nonspecific T abnormalities, lateral leads Artifact Confirmed by Garrick Charleston [phone]) on 12/23/2023 1:54:16 PM   CV: Echo 02/19/2024: IMPRESSIONS   1. Left ventricular ejection fraction, by estimation, is 50 to 55%. The  left ventricle has low normal function. The left ventricle has no regional  wall motion abnormalities. There is mild concentric left ventricular  hypertrophy. Left ventricular  diastolic parameters are indeterminate.   2. Right ventricular systolic function is moderately reduced. The right  ventricular size is normal. There is mildly elevated pulmonary artery  systolic pressure. The estimated right ventricular systolic pressure is  37.3 mmHg.   3. Left atrial size was severely dilated.   4. Right atrial size was mildly dilated.   5. The mitral valve is normal in structure. Mild mitral valve  regurgitation.   6. Tricuspid valve regurgitation is mild to moderate.   7. The aortic valve was not well visualized. Aortic valve regurgitation  is trivial. Aortic valve sclerosis/calcification is present, without any  evidence of aortic stenosis.   8. Aortic dilatation noted. There is mild dilatation of the aortic root,  measuring 40 mm. There is mild dilatation of the ascending aorta,  measuring 40 mm.   9. The inferior vena cava is dilated in size with <50% respiratory  variability, suggesting right atrial pressure of 15 mmHg.    US  Carotid 06/25/2014: Summary:  Bilateral: mild calcific plaque origin ICA. 1-39% ICA stenosis.  Vertebral artery flow is antegrade.   Past Medical History:  Diagnosis Date   Allergy    Arthritis    oa back   Asthma    CAP (community acquired pneumonia) 05/23/2015   Cataract    both eyes   CHF (congestive heart failure) (HCC) 2001   sees primary for   Chronic back pain    Chronic kidney disease    ckd 3   Cirrhosis (HCC)    alcoholic with h/o varices, ascites   Coronary artery disease     nonobstrucrtibe   ETOH abuse    GERD (gastroesophageal reflux disease)    Gout    Hepatitis    hepatitic c 2018 24 week tx with ecuplipsa, no hepatitic c detected after tx   History of blood transfusion 8-9- yrs ago   Hypertension    Optic neuropathy, left    PAD (peripheral artery disease)    slight  primary manages   Polysubstance abuse (HCC)    Primary hypertension    Renal cell carcinoma (HCC) 2016, 2018 and 2022   left ablation   Seizures [facility])    age 73 none since   Sickle cell trait    Stroke (HCC) 1998   light no problems with   Uses walker 12/04/2020   Wears dentures    Wears glasses     Past Surgical History:  Procedure Laterality Date   ARTHRODESIS METATARSALPHALANGEAL JOINT (MTPJ) Right 12/07/2020   Procedure: ARTHRODESIS METATARSALPHALANGEAL JOINT (MTPJ);  Surgeon: Janit Thresa HERO, DPM;  Location: [facility];  Service: Podiatry;  Laterality: Right;   COLONOSCOPY  2021   ESOPHAGOGASTRODUODENOSCOPY N/A 08/14/2014   Procedure: ESOPHAGOGASTRODUODENOSCOPY (EGD);  Surgeon: Gwendlyn ONEIDA Buddy, MD;  Location: [facility];  Service: Endoscopy;  Laterality: N/A;   ESOPHAGOGASTRODUODENOSCOPY (EGD) WITH PROPOFOL  N/A 06/03/2017   Procedure: ESOPHAGOGASTRODUODENOSCOPY (EGD) WITH PROPOFOL ;  Surgeon: Aneita Gwendlyn DASEN, MD;  Location: [facility];  Service: Endoscopy;  Laterality: N/A;   HALLUX FUSION Left 07/28/2020   Procedure: HALLUX FUSION MPJ;  Surgeon: Janit Thresa HERO, DPM;  Location: [facility];  Service: Podiatry;  Laterality: Left;   HAMMER TOE SURGERY Left 07/28/2020   Procedure: HAMMER TOE CORRECTION  2,3,AND4 LEFT FOOT;  Surgeon: Janit Thresa HERO, DPM;  Location: [facility];  Service: Podiatry;  Laterality: Left;   HAMMER TOE SURGERY Right 12/07/2020   Procedure: HAMMER TOE CORRECTION  2-4;  Surgeon: Janit Thresa HERO, DPM;  Location: [facility];  Service: Podiatry;  Laterality: Right;   IR PARACENTESIS  09/05/2023    IR PARACENTESIS  09/16/2023   IR PARACENTESIS  12/16/2023   IR PARACENTESIS  12/24/2023   IR PARACENTESIS  12/31/2023   IR PARACENTESIS  01/14/2024   IR PARACENTESIS  01/22/2024   IR PARACENTESIS  02/03/2024   IR PARACENTESIS  02/20/2024   IR PARACENTESIS  02/27/2024   IR PARACENTESIS  02/13/2024   IR PARACENTESIS  03/05/2024   IR PARACENTESIS  03/12/2024   IR PARACENTESIS  03/19/2024   IR PARACENTESIS  04/02/2024   IR PARACENTESIS  04/09/2024   IR PARACENTESIS  04/16/2024   IR PARACENTESIS  04/23/2024   IR PARACENTESIS  04/30/2024   IR RADIOLOGIST EVAL & MGMT  09/25/2016   IR RADIOLOGIST EVAL & MGMT  12/10/2016   IR RADIOLOGIST EVAL & MGMT  03/25/2018   IR RADIOLOGIST EVAL & MGMT  03/24/2019   IR RADIOLOGIST EVAL & MGMT  05/17/2020   IR RADIOLOGIST EVAL & MGMT  08/03/2020   IR RADIOLOGIST EVAL & MGMT  11/22/2020   IR RADIOLOGIST EVAL & MGMT  05/31/2021   IR RADIOLOGIST EVAL & MGMT  02/16/2024   METATARSAL HEAD EXCISION Left 07/28/2020   Procedure: METATARSAL HEAD EXCISION TOES 2,3,AND 4  LEFT FOOT;  Surgeon: Janit Thresa HERO, DPM;  Location: [facility];  Service: Podiatry;  Laterality: Left;   METATARSAL OSTEOTOMY Right 12/07/2020   Procedure: METATARSAL OSTEOTOMY TOES 2-4 RIGHT FOOT;  Surgeon: Janit Thresa HERO, DPM;  Location: [facility];  Service: Podiatry;  Laterality: Right;   none     RADIOFREQUENCY ABLATION Left 11/08/2016   Procedure: LEFT RENAL CRYOABLATION;  Surgeon: Vanice Sharper, MD;  Location: [facility];  Service: Anesthesiology;  Laterality: Left;   RADIOLOGY WITH ANESTHESIA N/A 08/15/2014   Procedure: RADIOLOGY WITH ANESTHESIA;  Surgeon: Sharper Vanice, MD;  Location: [facility] OR;  Service: Radiology;  Laterality: N/A;   RADIOLOGY WITH ANESTHESIA Left 07/05/2020   Procedure: CT CRYOABLATION;  Surgeon: Vanice Sharper, MD;  Location: [facility];  Service: Anesthesiology;  Laterality: Left;   UPPER GASTROINTESTINAL ENDOSCOPY      MEDICATIONS:  clobetasol cream  (TEMOVATE) 0.05 %   fluticasone  (FLONASE ) 50 MCG/ACT nasal spray   albuterol  (PROAIR  HFA) 108 (90 Base) MCG/ACT inhaler   allopurinol  (ZYLOPRIM ) 100 MG tablet   amLODipine  (NORVASC ) 10 MG tablet   atorvastatin  (LIPITOR) 40 MG tablet   beclomethasone (QVAR  REDIHALER) 80 MCG/ACT inhaler   brimonidine  (ALPHAGAN ) 0.2 % ophthalmic solution   cetirizine (ZYRTEC) 10 MG chewable tablet   FEROSUL 325 (65 Fe) MG tablet   fluticasone  (FLOVENT  HFA) 220 MCG/ACT inhaler   furosemide  (LASIX ) 40 MG tablet   hydrALAZINE  (APRESOLINE ) 10 MG tablet  HYDROcodone -acetaminophen  (NORCO/VICODIN) 5-325 MG tablet   ketoconazole  (NIZORAL ) 2 % shampoo   lactulose  (CHRONULAC ) 10 GM/15ML solution   meloxicam  (MOBIC ) 15 MG tablet   Menthol -Methyl Salicylate  (SALONPAS  PAIN RELIEF  PATCH) PTCH   metoprolol  succinate (TOPROL  XL) 25 MG 24 hr tablet   mupirocin  cream (BACTROBAN ) 2 %   omeprazole  (PRILOSEC) 40 MG capsule   oxyCODONE  (ROXICODONE ) 5 MG immediate release tablet   potassium chloride  (KLOR-CON ) 10 MEQ tablet   spironolactone  (ALDACTONE ) 50 MG tablet    Isaiah Ruder, PA-C Surgical Short Stay/Anesthesiology [facility] Phone [phone] [facility] Phone [phone] 05/06/2024 9:43 AM

## 2024-05-05 NOTE — Progress Notes (Signed)
 PCP - Cloria Annabella CROME, DO  Cardiologist -   PPM/ICD - denies Device Orders - n/a Rep Notified - n/a  Chest x-ray - 12-16-23 EKG - 12-23-23 Stress Test -  ECHO - 02-19-24 Cardiac Cath -   CPAP -   DM- denies  Blood Thinner Instructions: denies Aspirin  Instructions: denies  ERAS Protcol - NPO  COVID TEST- n/a  Anesthesia review: Yes, HX of A flutter, CHF, CAD, HTN, stroke Sickle cell trait  Patient verbally denies any shortness of breath, fever, cough and chest pain during phone call   -------------  SDW INSTRUCTIONS given:  Your procedure is scheduled on May 08, 2023.  Report to [facility] Main Entrance A at 5:30 A.M., and check in at the Admitting office.  Call this number if you have problems the morning of surgery:  [phone]   Remember:  Do not eat or drink after midnight the night before your surgery      Take these medicines the morning of surgery with A SIP OF WATER  albuterol  (PROAIR  HFA) inhaler  allopurinol  (ZYLOPRIM )  metoprolol  succinate (TOPROL  XL)  omeprazole  (PRILOSEC)     As of today, STOP taking any Aspirin  (unless otherwise instructed by your surgeon) Aleve, Naproxen, Ibuprofen , Motrin , Advil , Goody's, BC's, all herbal medications, fish oil, and all vitamins.                      Do not wear jewelry, make up, or nail polish            Do not wear lotions, powders, perfumes/colognes, or deodorant.            Do not shave 48 hours prior to surgery.  Men may shave face and neck.            Do not bring valuables to the hospital.            [facility] is not responsible for any belongings or valuables.  Do NOT Smoke (Tobacco/Vaping) 24 hours prior to your procedure If you use a CPAP at night, you may bring all equipment for your overnight stay.   Contacts, glasses, dentures or bridgework may not be worn into surgery.      For patients admitted to the hospital, discharge time will be determined by your treatment team.   Patients  discharged the day of surgery will not be allowed to drive home, and someone needs to stay with them for 24 hours.    Special instructions:   [facility]- Preparing For Surgery  Before surgery, you can play an important role. Because skin is not sterile, your skin needs to be as free of germs as possible. You can reduce the number of germs on your skin by washing with CHG (chlorahexidine gluconate) Soap before surgery.  CHG is an antiseptic cleaner which kills germs and bonds with the skin to continue killing germs even after washing.    Oral Hygiene is also important to reduce your risk of infection.  Remember - BRUSH YOUR TEETH THE MORNING OF SURGERY WITH YOUR REGULAR TOOTHPASTE  Please do not use if you have an allergy to CHG or antibacterial soaps. If your skin becomes reddened/irritated stop using the CHG.  Do not shave (including legs and underarms) for at least 48 hours prior to first CHG shower. It is OK to shave your face.  Please follow these instructions carefully.   Shower the NIGHT BEFORE SURGERY and the MORNING OF SURGERY with DIAL Soap.  Pat yourself dry with a CLEAN TOWEL.  Wear CLEAN PAJAMAS to bed the night before surgery  Place CLEAN SHEETS on your bed the night of your first shower and DO NOT SLEEP WITH PETS.   Day of Surgery: Please shower morning of surgery  Wear Clean/Comfortable clothing the morning of surgery Do not apply any deodorants/lotions.   Remember to brush your teeth WITH YOUR REGULAR TOOTHPASTE.   Questions were answered. Patient verbalized understanding of instructions.

## 2024-05-06 ENCOUNTER — Other Ambulatory Visit: Payer: Self-pay | Admitting: Student

## 2024-05-06 DIAGNOSIS — K766 Portal hypertension: Secondary | ICD-10-CM

## 2024-05-06 NOTE — H&P (Signed)
 "      Chief Complaint: Patient was seen in consultation today for cirrhosis, ascites, portal hypertension  Referring Physician(s): Cecille Pellet  Supervising Physician: Jennefer Rover  Patient Status: [facility] - Out-pt  History of Present Illness: John [name] is a 69 y.o. male with history of Hepatitis C (treated), polysubstance abuse, alcoholic cirrhosis, esophageal varices, recurrent ascites, CKD III, atrial flutter, HFpEF known to Interventional Radiology from prior retrograde transvenous obliteration of gastric varices (Dr. Vanice, 08/15/14), left renal cryoablation with Dr. Vanice in July 2018 with re-ablation in 07/2020 due to local, regional recurrence with stable follow-up and no evidence of new disease. He has developed recurrent large-volume ascites requiring frequent paracentesis and was referred to IR for possible TIPS procedure.  Patient met in consultation with Dr. Jennefer 02/16/24 at which time he expressed interest in proceeding. Patient presents to surgical short stay for procedure today having arrived by medical transport.  During admission screening with anesthesia he admits to drug use overnight.    Past Medical History:  Diagnosis Date   Allergy    Arthritis    oa back   Asthma    CAP (community acquired pneumonia) 05/23/2015   Cataract    both eyes   CHF (congestive heart failure) (HCC) 2001   sees primary for   Chronic back pain    Chronic kidney disease    ckd 3   Cirrhosis (HCC)    alcoholic with h/o varices, ascites   Coronary artery disease    nonobstrucrtibe   ETOH abuse    GERD (gastroesophageal reflux disease)    Gout    Hepatitis    hepatitic c 2018 24 week tx with ecuplipsa, no hepatitic c detected after tx   History of blood transfusion 8-9- yrs ago   Hypertension    Optic neuropathy, left    PAD (peripheral artery disease)    slight  primary manages   Polysubstance abuse (HCC)    Primary hypertension    Renal cell carcinoma (HCC) 2016,  2018 and 2022   left ablation   Seizures [facility])    age 25 none since   Sickle cell trait    Stroke (HCC) 1998   light no problems with   Uses walker 12/04/2020   Wears dentures    Wears glasses     Past Surgical History:  Procedure Laterality Date   ARTHRODESIS METATARSALPHALANGEAL JOINT (MTPJ) Right 12/07/2020   Procedure: ARTHRODESIS METATARSALPHALANGEAL JOINT (MTPJ);  Surgeon: Janit Thresa HERO, DPM;  Location: [facility];  Service: Podiatry;  Laterality: Right;   COLONOSCOPY  2021   ESOPHAGOGASTRODUODENOSCOPY N/A 08/14/2014   Procedure: ESOPHAGOGASTRODUODENOSCOPY (EGD);  Surgeon: Gwendlyn ONEIDA Buddy, MD;  Location: [facility];  Service: Endoscopy;  Laterality: N/A;   ESOPHAGOGASTRODUODENOSCOPY (EGD) WITH PROPOFOL  N/A 06/03/2017   Procedure: ESOPHAGOGASTRODUODENOSCOPY (EGD) WITH PROPOFOL ;  Surgeon: Buddy Gwendlyn ONEIDA, MD;  Location: [facility];  Service: Endoscopy;  Laterality: N/A;   HALLUX FUSION Left 07/28/2020   Procedure: HALLUX FUSION MPJ;  Surgeon: Janit Thresa HERO, DPM;  Location: [facility];  Service: Podiatry;  Laterality: Left;   HAMMER TOE SURGERY Left 07/28/2020   Procedure: HAMMER TOE CORRECTION  2,3,AND4 LEFT FOOT;  Surgeon: Janit Thresa HERO, DPM;  Location: [facility];  Service: Podiatry;  Laterality: Left;   HAMMER TOE SURGERY Right 12/07/2020   Procedure: HAMMER TOE CORRECTION  2-4;  Surgeon: Janit Thresa HERO, DPM;  Location: [facility];  Service: Podiatry;  Laterality: Right;  IR PARACENTESIS  09/05/2023   IR PARACENTESIS  09/16/2023   IR PARACENTESIS  12/16/2023   IR PARACENTESIS  12/24/2023   IR PARACENTESIS  12/31/2023   IR PARACENTESIS  01/14/2024   IR PARACENTESIS  01/22/2024   IR PARACENTESIS  02/03/2024   IR PARACENTESIS  02/20/2024   IR PARACENTESIS  02/27/2024   IR PARACENTESIS  02/13/2024   IR PARACENTESIS  03/05/2024   IR PARACENTESIS  03/12/2024   IR PARACENTESIS  03/19/2024   IR PARACENTESIS   04/02/2024   IR PARACENTESIS  04/09/2024   IR PARACENTESIS  04/16/2024   IR PARACENTESIS  04/23/2024   IR PARACENTESIS  04/30/2024   IR RADIOLOGIST EVAL & MGMT  09/25/2016   IR RADIOLOGIST EVAL & MGMT  12/10/2016   IR RADIOLOGIST EVAL & MGMT  03/25/2018   IR RADIOLOGIST EVAL & MGMT  03/24/2019   IR RADIOLOGIST EVAL & MGMT  05/17/2020   IR RADIOLOGIST EVAL & MGMT  08/03/2020   IR RADIOLOGIST EVAL & MGMT  11/22/2020   IR RADIOLOGIST EVAL & MGMT  05/31/2021   IR RADIOLOGIST EVAL & MGMT  02/16/2024   METATARSAL HEAD EXCISION Left 07/28/2020   Procedure: METATARSAL HEAD EXCISION TOES 2,3,AND 4  LEFT FOOT;  Surgeon: Janit Thresa HERO, DPM;  Location: [facility];  Service: Podiatry;  Laterality: Left;   METATARSAL OSTEOTOMY Right 12/07/2020   Procedure: METATARSAL OSTEOTOMY TOES 2-4 RIGHT FOOT;  Surgeon: Janit Thresa HERO, DPM;  Location:  SURGERY CENTER;  Service: Podiatry;  Laterality: Right;   none     RADIOFREQUENCY ABLATION Left 11/08/2016   Procedure: LEFT RENAL CRYOABLATION;  Surgeon: Vanice Sharper, MD;  Location: [facility];  Service: Anesthesiology;  Laterality: Left;   RADIOLOGY WITH ANESTHESIA N/A 08/15/2014   Procedure: RADIOLOGY WITH ANESTHESIA;  Surgeon: Sharper Vanice, MD;  Location: [facility];  Service: Radiology;  Laterality: N/A;   RADIOLOGY WITH ANESTHESIA Left 07/05/2020   Procedure: CT CRYOABLATION;  Surgeon: Vanice Sharper, MD;  Location: [facility];  Service: Anesthesiology;  Laterality: Left;   UPPER GASTROINTESTINAL ENDOSCOPY      Allergies: Penicillins  Medications: Prior to Admission medications  Medication Sig Start Date End Date Taking? Authorizing Provider  acetaminophen  (TYLENOL ) 325 MG tablet Take 650 mg by mouth every 8 (eight) hours as needed for moderate pain (pain score 4-6) (Do not exceed 6 tabs per day).    [provider]  albuterol  (PROAIR  HFA) 108 (90 Base) MCG/ACT inhaler Inhale 2 puffs into the lungs every 6 (six) hours as needed for  wheezing or shortness of breath. 12/11/23   Rizwan, Saima, MD  allopurinol  (ZYLOPRIM ) 100 MG tablet Take 0.5 tablets (50 mg total) by mouth daily. 12/11/23 05/06/24  Rizwan, Saima, MD  amLODipine  (NORVASC ) 10 MG tablet Take 10 mg by mouth daily. Not taking Patient not taking: Reported on 05/05/2024    [provider]  atorvastatin  (LIPITOR) 40 MG tablet Take 1 tablet (40 mg total) by mouth at bedtime. 12/11/23 05/06/24  Rizwan, Saima, MD  beclomethasone (QVAR  REDIHALER) 80 MCG/ACT inhaler Inhale 1 puff into the lungs 2 (two) times daily. Patient not taking: Reported on 05/05/2024 12/11/23   Rizwan, Saima, MD  brimonidine  (ALPHAGAN ) 0.15 % ophthalmic solution Place 1 drop into both eyes 2 (two) times daily.    [provider]  brimonidine  (ALPHAGAN ) 0.2 % ophthalmic solution Place 1 drop into both eyes 2 (two) times daily. Patient not taking: Reported on 05/06/2024 12/11/23   Rizwan, Saima, MD  cetirizine (ZYRTEC) 10 MG chewable tablet Chew 10 mg by mouth daily. Patient not taking: Reported on 05/05/2024    [provider]  Cholecalciferol (VITAMIN D3) 125 MCG (5000 UT) CAPS Take 1 capsule by mouth 2 (two) times daily.    [provider]  ciprofloxacin  (CIPRO ) 500 MG tablet Take 500 mg by mouth 2 (two) times daily.    [provider]  clobetasol cream (TEMOVATE) 0.05 % Apply 1 Application topically 2 (two) times daily.    [provider]  FEROSUL 325 (65 Fe) MG tablet Take 1 tablet (325 mg total) by mouth every Monday, Wednesday, and Friday. 12/12/23 05/06/24  Rizwan, Saima, MD  fluticasone  (FLONASE ) 50 MCG/ACT nasal spray Place 1 spray into both nostrils daily as needed for allergies or rhinitis.    [provider]  fluticasone  (FLOVENT  HFA) 220 MCG/ACT inhaler Inhale 2 puffs into the lungs 2 (two) times daily.    [provider]  folic acid  (FOLVITE ) 1 MG tablet Take 1 mg by mouth daily.    [provider]  furosemide  (LASIX ) 40 MG  tablet Take 2 tablets (80 mg total) by mouth daily for 5 days. Patient taking differently: Take 40 mg by mouth daily. 03/21/24 05/06/24  Myriam Dorn BROCKS, PA  hydrALAZINE  (APRESOLINE ) 10 MG tablet Take 10 mg by mouth 3 (three) times daily. Not taking Patient not taking: Reported on 05/05/2024    [provider]  HYDROcodone -acetaminophen  (NORCO/VICODIN) 5-325 MG tablet Take 1 tablet by mouth every 6 (six) hours as needed for moderate pain (pain score 4-6). Patient not taking: Reported on 05/05/2024    [provider]  ketoconazole  (NIZORAL ) 2 % shampoo Apply topically to shampoo the scalp 2 times a week Patient not taking: Reported on 05/06/2024 12/11/23   Rizwan, Saima, MD  lactulose  (CHRONULAC ) 10 GM/15ML solution Take 15 mLs (10 g total) by mouth 2 (two) times daily as needed for mild constipation. Patient not taking: Reported on 05/05/2024 12/11/23   Rizwan, Saima, MD  melatonin 5 MG TABS Take 5 mg by mouth at bedtime.    [provider]  meloxicam  (MOBIC ) 15 MG tablet Take 15 mg by mouth daily. Patient not taking: Reported on 05/05/2024    [provider]  Menthol , Topical Analgesic, (BIOFREEZE COOL THE PAIN) 4 % GEL Apply 1 Application topically See admin instructions. 1-2 times daily as needed for pain; lumbar region    [provider]  Menthol -Methyl Salicylate  (SALONPAS  PAIN RELIEF  PATCH) PTCH Apply 1 patch topically daily as needed (for mid-back pain and lower back pain). Patient taking differently: Apply 1 patch topically daily. 12/11/23   Rizwan, Saima, MD  metoprolol  succinate (TOPROL  XL) 25 MG 24 hr tablet Take 1 tablet (25 mg total) by mouth at bedtime. Patient taking differently: Take 12.5 mg by mouth daily. 12/11/23 05/06/24  Rizwan, Saima, MD  mupirocin  cream (BACTROBAN ) 2 % Apply 1 Application topically 2 (two) times daily. Patient not taking: Reported on 05/06/2024 03/21/24   Myriam Dorn BROCKS, PA  omeprazole  (PRILOSEC) 40 MG capsule Take 1  capsule (40 mg total) by mouth daily before breakfast. 12/11/23 05/06/24  Earley Saucer, MD  OVER THE COUNTER MEDICATION Apply 1 Application topically daily as needed (dry skin). Bag Balm Ointment    [provider]  oxyCODONE  (ROXICODONE ) 5 MG immediate release tablet Take 0.5 tablets (2.5 mg total) by mouth every 4 (four) hours as needed for severe pain (pain score 7-10) or breakthrough pain. Patient not taking: Reported  on 05/05/2024 03/29/24   Dreama Longs, MD  potassium chloride  (KLOR-CON ) 10 MEQ tablet Take 1 tablet (10 mEq total) by mouth daily. 12/11/23 05/06/24  Rizwan, Saima, MD  spironolactone  (ALDACTONE ) 50 MG tablet Take 0.5 tablets (25 mg total) by mouth daily for 60 doses. Patient taking differently: Take 50 mg by mouth daily. 12/11/23 05/06/24  Rizwan, Saima, MD  thiamine  (VITAMIN B-1) 100 MG tablet Take 100 mg by mouth daily.    [provider]     Family History  Problem Relation Age of Onset   Hypertension Mother        Living   Kidney disease Mother    Diabetes Mother    Heart disease Mother    Hypertension Father        Deceased, 5   Ulcers Father    Stomach cancer Father    Hypertension Brother    Diabetes Brother    Kidney disease Brother    Hypertension Sister    Colon cancer Neg Hx    Esophageal cancer Neg Hx    Rectal cancer Neg Hx     Social History   Socioeconomic History   Marital status: Single    Spouse name: Not on file   Number of children: 0   Years of education: Not on file   Highest education level: Not on file  Occupational History   Occupation: retired  Tobacco Use   Smoking status: Some Days    Current packs/day: 0.00    Average packs/day: 0.3 packs/day for 20.0 years (6.0 ttl pk-yrs)    Types: Cigarettes    Start date: 04/30/1991    Last attempt to quit: 04/30/2011    Years since quitting: 13.0   Smokeless tobacco: Never   Tobacco comments:    2015  Vaping Use   Vaping status: Never Used  Substance and Sexual Activity    Alcohol use: No    Comment: not in 11 years   Drug use: Yes    Types: Cocaine, Marijuana    Comment: Last used cocaine and marijuana within the past week1/2-26-1/8-26   Sexual activity: Not Currently  Other Topics Concern   Not on file  Social History Narrative   Lives with niece in a 2 story home.     On disability since 2015 for low back pain.  Used to work environmental manager.    Highest level of education: 11th grade   Social Drivers of Health   Tobacco Use: High Risk (05/07/2024)   Patient History    Smoking Tobacco Use: Some Days    Smokeless Tobacco Use: Never    Passive Exposure: Not on file  Financial Resource Strain: Not on file  Food Insecurity: Food Insecurity Present (12/11/2023)   Epic    Worried About Programme Researcher, Broadcasting/film/video in the Last Year: Sometimes true    [employer] of Food in the Last Year: Never true  Transportation Needs: No Transportation Needs (12/11/2023)   Epic    Lack of Transportation (Medical): No    Lack of Transportation (Non-Medical): No  Physical Activity: Not on file  Stress: Not on file  Social Connections: Moderately Integrated (12/10/2023)   Social Connection and Isolation Panel    Frequency of Communication with Friends and Family: Twice a week    Frequency of Social Gatherings with Friends and Family: Once a week    Attends Religious Services: 1 to 4 times per year    Active Member of Clubs or Organizations: No  Attends Banker Meetings: 1 to 4 times per year    Marital Status: Never married  Depression (PHQ2-9): Not on file  Alcohol Screen: Not on file  Housing: Low Risk (12/11/2023)   Epic    Unable to Pay for Housing in the Last Year: No    Number of Times Moved in the Last Year: 0    Homeless in the Last Year: No  Utilities: Not At Risk (12/11/2023)   Epic    Threatened with loss of utilities: No  Health Literacy: Not on file     Review of Systems: A 12 point ROS discussed and pertinent positives are indicated in the  HPI above.  All other systems are negative.  Review of Systems  Constitutional:  Negative for fatigue and fever.  Respiratory:  Negative for cough and shortness of breath.   Gastrointestinal:  Positive for abdominal distention. Negative for abdominal pain, nausea and vomiting.  Musculoskeletal:  Negative for back pain.  Psychiatric/Behavioral:  Negative for behavioral problems and confusion.     Vital Signs: There were no vitals taken for this visit.  Physical Exam Vitals and nursing note reviewed.  Constitutional:      General: He is not in acute distress.    Appearance: Normal appearance. He is not ill-appearing.  HENT:     Mouth/Throat:     Mouth: Mucous membranes are moist.     Pharynx: Oropharynx is clear.  Cardiovascular:     Rate and Rhythm: Normal rate and regular rhythm.  Pulmonary:     Effort: Pulmonary effort is normal. No respiratory distress.  Abdominal:     General: Abdomen is flat. There is distension.     Tenderness: There is no abdominal tenderness.  Skin:    General: Skin is warm and dry.  Neurological:     General: No focal deficit present.     Mental Status: He is alert and oriented to person, place, and time. Mental status is at baseline.  Psychiatric:        Mood and Affect: Mood normal.        Behavior: Behavior normal.        Thought Content: Thought content normal.        Judgment: Judgment normal.          Imaging: IR Paracentesis Result Date: 04/30/2024 INDICATION: 69 year old male with history of decompensated ETOH cirrhosis, with recurrent ascites. IR was requested for therapeutic paracentesis. EXAM: ULTRASOUND GUIDED THERAPEUTIC PARACENTESIS MEDICATIONS: 7 mL of 1% lidocaine . COMPLICATIONS: None immediate. PROCEDURE: Informed written consent was obtained from the patient after a discussion of the risks, benefits and alternatives to treatment. A timeout was performed prior to the initiation of the procedure. Initial ultrasound scanning  demonstrates a large amount of ascites within the right lower abdominal quadrant. The right lower abdomen was prepped and draped in the usual sterile fashion. 1% lidocaine  was used for local anesthesia. Following this, a 19 gauge, 10-cm, Yueh catheter was introduced. An ultrasound image was saved for documentation purposes. The paracentesis was performed. The catheter was removed and a dressing was applied. The patient tolerated the procedure well without immediate post procedural complication. Patient received post-procedure intravenous albumin ; see nursing notes for details. FINDINGS: A total of approximately 4.8 L of clear, straw-colored peritoneal fluid was removed. IMPRESSION: Successful ultrasound-guided paracentesis yielding 4.8 liters of peritoneal fluid. Procedure performed Carlin Griffon, PA-C, under the direct supervision of Dr. Juliene Balder, MD Electronically Signed   By: Juliene Balder HERO.D.  On: 04/30/2024 13:58   IR Paracentesis Result Date: 04/23/2024 INDICATION: Alcoholic Cirrhosis of [place] with Ascites 69 year old male with history of alcoholic cirrhosis with recurrent ascites. Received request for therapeutic paracentesis. EXAM: ULTRASOUND GUIDED THERAPEUTIC PARACENTESIS MEDICATIONS: 10 mL 1% lidocaine  with epinephrine  COMPLICATIONS: None immediate. PROCEDURE: Informed written consent was obtained from the patient after a discussion of the risks, benefits and alternatives to treatment. A timeout was performed prior to the initiation of the procedure. Initial ultrasound scanning demonstrates a large amount of ascites within the right upper abdominal quadrant. The right upper abdomen was prepped and draped in the usual sterile fashion. 1% lidocaine  with epinephrine  was used for local anesthesia. Following this, a 19 gauge, 7-cm, Yueh catheter was introduced. An ultrasound image was saved for documentation purposes. The paracentesis was performed. The catheter was removed and a dressing was applied. The  patient tolerated the procedure well without immediate post procedural complication. FINDINGS: A total of approximately 5.7 L of clear yellow fluid was removed. IMPRESSION: Successful ultrasound-guided paracentesis yielding 5.7 L of peritoneal fluid. Performed by: Kristi Davenport, NP PLAN: The patient has previously been formally evaluated by the [facility] Interventional Radiology Portal Hypertension Clinic and is being actively followed for potential future intervention. Thom Hall, MD [facility] Electronically Signed   By: Thom Hall M.D.   On: 04/23/2024 18:22   IR Paracentesis Result Date: 04/16/2024 INDICATION: History of alcoholic cirrhosis and recurrent ascites. Request for therapeutic paracentesis. EXAM: ULTRASOUND GUIDED THERAPEUTIC PARACENTESIS MEDICATIONS: 10 mL 1% lidocaine  COMPLICATIONS: None immediate. PROCEDURE: Informed written consent was obtained from the patient after a discussion of the risks, benefits and alternatives to treatment. A timeout was performed prior to the initiation of the procedure. Initial ultrasound scanning demonstrates a moderate amount of ascites within the left lower abdominal quadrant. The left lower abdomen was prepped and draped in the usual sterile fashion. 1% lidocaine  was used for local anesthesia. Following this, a 19 gauge, 10-cm, OneStep catheter was introduced. An ultrasound image was saved for documentation purposes. The paracentesis was performed. The catheter was removed and a dressing was applied. The patient tolerated the procedure well without immediate post procedural complication. Patient received post-procedure intravenous albumin ; see nursing notes for details. FINDINGS: A total of approximately 5.6 liters of yellow fluid was removed. Samples were sent to the laboratory as requested by the clinical team. IMPRESSION: Successful ultrasound-guided paracentesis yielding 5.6 liters of  peritoneal fluid. Performed by: Wyatt Pommier, PA-C Electronically Signed   By: CHRISTELLA.  Shick M.D.   On: 04/16/2024 14:57   IR Paracentesis Result Date: 04/09/2024 INDICATION: 69 year old male. History of alcoholic cirrhosis with recurrent ascites. Request for therapeutic paracentesis. EXAM: ULTRASOUND GUIDED THERAPEUTIC PARACENTESIS MEDICATIONS: LIDOCAINE  1% 10 COMPLICATIONS: None immediate. PROCEDURE: Informed written consent was obtained from the patient after a discussion of the risks, benefits and alternatives to treatment. A timeout was performed prior to the initiation of the procedure. Initial ultrasound scanning demonstrates a large amount of ascites within the right lower abdominal quadrant. The right lower abdomen was prepped and draped in the usual sterile fashion. 1% lidocaine  was used for local anesthesia. Following this, a 19 gauge, 7-cm, Yueh catheter was introduced. An ultrasound image was saved for documentation purposes. The paracentesis was performed. The catheter was removed and a dressing was applied. The patient tolerated the procedure well without immediate post procedural complication. Patient received post-procedure intravenous albumin ; see nursing notes for details. FINDINGS: A total of approximately 7.2 of straw-colored  fluid was removed. IMPRESSION: Successful ultrasound-guided paracentesis yielding 7.2 liters of peritoneal fluid. Performed by Delon Beagle NP PLAN: The patient has previously been formally evaluated by the [facility] Interventional Radiology Portal Hypertension Clinic and is being actively followed for potential future intervention. Electronically Signed   By: Wilkie Lent M.D.   On: 04/09/2024 14:30    Labs:  CBC: Recent Labs    03/21/24 1140 03/26/24 1138 03/29/24 0556 05/07/24 0951  WBC 4.2 5.6 5.6 4.8  HGB 9.7* 9.5* 10.3* 10.8*  HCT 31.0* 30.0* 33.4* 34.0*  PLT 137* 123* 143* 129*    COAGS: Recent Labs    12/02/23 1527 12/16/23 1026  03/12/24 1247 03/19/24 1941  INR 1.3* 1.3* 1.1 1.1  APTT  --  31  --   --     BMP: Recent Labs    03/19/24 1941 03/21/24 1140 03/26/24 1138 03/29/24 0556  NA 139 137 140 138  K 3.6 3.8 4.2 5.0  CL 106 105 108 107  CO2 24 24 24 22   GLUCOSE 78 84 94 97  BUN 21 20 21 21   CALCIUM  8.6* 8.5* 8.9 8.6*  CREATININE 1.44* 1.58* 1.72* 1.71*  GFRNONAA 53* 48* 43* 43*    LIVER FUNCTION TESTS: Recent Labs    03/19/24 1941 03/21/24 1140 03/26/24 1138 03/29/24 0556  BILITOT 0.9 1.0 0.8 1.1  AST 22 25 25 28   ALT <5 10 <5 13  ALKPHOS 91 81 71 64  PROT 6.8 6.9 7.0 6.6  ALBUMIN  3.2* 3.3* 3.2* 2.7*    TUMOR MARKERS: No results for input(s): AFPTM, CEA, CA199, CHROMGRNA in the last 8760 hours.  Assessment and Plan: Portal hypertension, cirrhosis, recurrent ascites Patient with history of esophageal varices, frequent large-volume paracenteses as a result of his portal hypertension.  He has been seen in consultation for possible TIPS procedure and desires to proceed.  He presents today being brought by medical transport.  He admits to cocaine use overnight/early morning and therefore is not appropriate for anesthesia today.  His mentating at baseline.  Answers orientation questions correctly and is candid and sincere about his choices overnight.   He reports his abdomen is distended and he is in need for a therapeutic paracentesis.  We will plan to proceed with para prior to discharge.  Will schedule for 3 month follow-up to see Dr. Jennefer to discuss medical and social readiness for procedure.   Thank you for this interesting consult.  I greatly enjoyed meeting John [name] and look forward to participating in their care.  A copy of this report was sent to the requesting provider on this date.  Electronically Signed: Merina Behrendt Sue-Ellen Shanyiah Conde, PA 05/07/2024, 10:03 AM   I spent a total of  15 Minutes   in face to face in clinical consultation, greater than 50% of which  was counseling/coordinating care for portal hypertension.   "

## 2024-05-06 NOTE — Anesthesia Preprocedure Evaluation (Addendum)
"                                    Anesthesia Evaluation  Patient identified by MRN, date of birth, ID band  Reviewed: Allergy & Precautions, NPO status , Patient's Chart, lab work & pertinent test results  History of Anesthesia Complications Negative for: history of anesthetic complications  Airway        Dental   Pulmonary asthma , COPD, Current Smoker, former smoker          Cardiovascular hypertension, + CAD, + Peripheral Vascular Disease and +CHF  + dysrhythmias Atrial Fibrillation    TTE (2025):  1. Left ventricular ejection fraction, by estimation, is 50 to 55%. The  left ventricle has low normal function. The left ventricle has no regional  wall motion abnormalities. There is mild concentric left ventricular  hypertrophy. Left ventricular  diastolic parameters are indeterminate.   2. Right ventricular systolic function is moderately reduced. The right  ventricular size is normal. There is mildly elevated pulmonary artery  systolic pressure. The estimated right ventricular systolic pressure is  37.3 mmHg.   3. Left atrial size was severely dilated.   4. Right atrial size was mildly dilated.   5. The mitral valve is normal in structure. Mild mitral valve  regurgitation.   6. Tricuspid valve regurgitation is mild to moderate.   7. The aortic valve was not well visualized. Aortic valve regurgitation  is trivial. Aortic valve sclerosis/calcification is present, without any  evidence of aortic stenosis.   8. Aortic dilatation noted. There is mild dilatation of the aortic root,  measuring 40 mm. There is mild dilatation of the ascending aorta,  measuring 40 mm.   9. The inferior vena cava is dilated in size with <50% respiratory  variability, suggesting right atrial pressure of 15 mmHg.     Neuro/Psych Seizures -,  CVA    GI/Hepatic ,GERD  Controlled and Medicated,,(+) Cirrhosis   Esophageal Varices and ascites  substance abuse  , Hepatitis -, C   Endo/Other    Renal/GU Renal disease Hx of RCC s/p ablation      Musculoskeletal  (+) Arthritis ,    Abdominal   Peds  Hematology  (+) Blood dyscrasia, Sickle cell trait and anemia   Anesthesia Other Findings   Reproductive/Obstetrics                              Anesthesia Physical Anesthesia Plan  ASA: 4  Anesthesia Plan: General   Post-op Pain Management:    Induction: Intravenous  PONV Risk Score and Plan: 2 and Ondansetron , Dexamethasone  and Treatment may vary due to age or medical condition  Airway Management Planned: Oral ETT  Additional Equipment: Arterial line  Intra-op Plan:   Post-operative Plan: Extubation in OR  Informed Consent: I have reviewed the patients History and Physical, chart, labs and discussed the procedure including the risks, benefits and alternatives for the proposed anesthesia with the patient or authorized representative who has indicated his/her understanding and acceptance.     Dental advisory given  Plan Discussed with: CRNA and Surgeon  Anesthesia Plan Comments: (PAT note written 05/06/2024 by Allison Zelenak, PA-C.  )         Anesthesia Quick Evaluation  "

## 2024-05-07 ENCOUNTER — Ambulatory Visit (HOSPITAL_COMMUNITY): Payer: Medicare (Managed Care)

## 2024-05-07 ENCOUNTER — Other Ambulatory Visit: Payer: Self-pay

## 2024-05-07 ENCOUNTER — Ambulatory Visit (HOSPITAL_COMMUNITY)
Admission: RE | Admit: 2024-05-07 | Discharge: 2024-05-07 | Disposition: A | Discharge status: Home / Self Care | Payer: Medicare (Managed Care) | Attending: Interventional Radiology | Admitting: Interventional Radiology

## 2024-05-07 ENCOUNTER — Encounter (HOSPITAL_COMMUNITY): Payer: Self-pay | Admitting: Interventional Radiology

## 2024-05-07 ENCOUNTER — Encounter (HOSPITAL_COMMUNITY): Payer: Self-pay | Admitting: Vascular Surgery

## 2024-05-07 ENCOUNTER — Encounter (HOSPITAL_COMMUNITY)
Admission: RE | Disposition: A | Discharge status: Home / Self Care | Payer: Self-pay | Source: Home / Self Care | Attending: Interventional Radiology

## 2024-05-07 ENCOUNTER — Emergency Department (HOSPITAL_COMMUNITY): Admission: EM | Admit: 2024-05-07 | Payer: Medicare (Managed Care) | Source: Home / Self Care

## 2024-05-07 DIAGNOSIS — K7030 Alcoholic cirrhosis of liver without ascites: Secondary | ICD-10-CM | POA: Diagnosis present

## 2024-05-07 DIAGNOSIS — K766 Portal hypertension: Secondary | ICD-10-CM

## 2024-05-07 DIAGNOSIS — R188 Other ascites: Secondary | ICD-10-CM

## 2024-05-07 DIAGNOSIS — Z538 Procedure and treatment not carried out for other reasons: Secondary | ICD-10-CM | POA: Insufficient documentation

## 2024-05-07 DIAGNOSIS — K7031 Alcoholic cirrhosis of liver with ascites: Secondary | ICD-10-CM

## 2024-05-07 LAB — COMPREHENSIVE METABOLIC PANEL WITH GFR
ALT: 9 U/L (ref 0–44)
AST: 27 U/L (ref 15–41)
Albumin: 3 g/dL — ABNORMAL LOW (ref 3.5–5.0)
Alkaline Phosphatase: 69 U/L (ref 38–126)
Anion gap: 9 (ref 5–15)
BUN: 20 mg/dL (ref 8–23)
CO2: 21 mmol/L — ABNORMAL LOW (ref 22–32)
Calcium: 8.5 mg/dL — ABNORMAL LOW (ref 8.9–10.3)
Chloride: 107 mmol/L (ref 98–111)
Creatinine, Ser: 1.3 mg/dL — ABNORMAL HIGH (ref 0.61–1.24)
GFR, Estimated: >60 mL/min
Glucose, Bld: 82 mg/dL (ref 70–99)
Potassium: 4.3 mmol/L (ref 3.5–5.1)
Sodium: 137 mmol/L (ref 135–145)
Total Bilirubin: 0.9 mg/dL (ref 0.0–1.2)
Total Protein: 6.8 g/dL (ref 6.5–8.1)

## 2024-05-07 LAB — PREPARE RBC (CROSSMATCH)

## 2024-05-07 LAB — CBC
HCT: 34 % — ABNORMAL LOW (ref 39.0–52.0)
Hemoglobin: 10.8 g/dL — ABNORMAL LOW (ref 13.0–17.0)
MCH: 25.1 pg — ABNORMAL LOW (ref 26.0–34.0)
MCHC: 31.8 g/dL (ref 30.0–36.0)
MCV: 78.9 fL — ABNORMAL LOW (ref 80.0–100.0)
Platelets: 129 K/uL — ABNORMAL LOW (ref 150–400)
RBC: 4.31 MIL/uL (ref 4.22–5.81)
RDW: 17.5 % — ABNORMAL HIGH (ref 11.5–15.5)
WBC: 4.8 K/uL (ref 4.0–10.5)
nRBC: 0 % (ref 0.0–0.2)

## 2024-05-07 LAB — PROTIME-INR
INR: 1.1 (ref 0.8–1.2)
Prothrombin Time: 15.3 s — ABNORMAL HIGH (ref 11.4–15.2)

## 2024-05-07 SURGERY — INSERTION, SHUNT, INTRAHEPATIC PORTOSYSTEMIC, TRANSJUGULAR
Anesthesia: General

## 2024-05-07 MED ORDER — ORAL CARE MOUTH RINSE: 15.0000 mL | Freq: Once | OROMUCOSAL | Status: DC

## 2024-05-07 MED ORDER — SODIUM CHLORIDE 0.9 % IV SOLN: 10.0000 mL/h | Freq: Once | INTRAVENOUS | Status: DC

## 2024-05-07 MED ORDER — LEVOFLOXACIN IN D5W 500 MG/100ML IV SOLN: 500.0000 mg | Freq: Once | INTRAVENOUS | Status: DC

## 2024-05-07 MED ORDER — CHLORHEXIDINE GLUCONATE 0.12 % MT SOLN: 15.0000 mL | Freq: Once | OROMUCOSAL | Status: DC

## 2024-05-07 MED ORDER — CHLORHEXIDINE GLUCONATE CLOTH 2 % EX PADS: 6.0000 | MEDICATED_PAD | Freq: Every day | CUTANEOUS | Status: DC

## 2024-05-07 MED ORDER — SODIUM CHLORIDE 0.9 % IV SOLN: INTRAVENOUS | Status: DC

## 2024-05-07 NOTE — Progress Notes (Signed)
 Today's procedure canceled by Dr. Dene and Dr. Jennefer due to patient's recent use of Cocaine. Dr. Jennefer informed patient to keep upcoming paracentesis appointment and that he will schedule the patient to follow up with him in the office in the next few weeks. PIV x 2 that was started by CRNA removed from right hand and left hand. Sites WNL. [facility] contacted for transportation home. Patient assisted getting dressed by nurse tech and taken to the lobby via wheelchair.

## 2024-05-07 NOTE — Progress Notes (Addendum)
 Dr. Dene and Dr. Jennefer made aware that patient states he last used Marijuana and Cocaine within  the past week and that he has not been taking his medications as prescribed by choice.

## 2024-05-07 NOTE — ED Triage Notes (Signed)
 Pt arrives via EMS from [facility]. Pt did not get his paracentesis today. Pt has abdominal distention and discomfort. Pt is AxOx4.

## 2024-05-08 LAB — TYPE AND SCREEN
ABO/RH(D): O POS
Antibody Screen: NEGATIVE

## 2024-05-13 ENCOUNTER — Other Ambulatory Visit (HOSPITAL_COMMUNITY): Payer: Medicare (Managed Care)

## 2024-05-14 ENCOUNTER — Other Ambulatory Visit (HOSPITAL_COMMUNITY): Payer: Self-pay | Admitting: Nurse Practitioner

## 2024-05-14 ENCOUNTER — Ambulatory Visit (HOSPITAL_COMMUNITY)
Admission: RE | Admit: 2024-05-14 | Discharge: 2024-05-14 | Disposition: A | Discharge status: Home / Self Care | Payer: Medicare (Managed Care) | Source: Ambulatory Visit | Attending: Emergency Medicine | Admitting: Emergency Medicine

## 2024-05-14 ENCOUNTER — Other Ambulatory Visit (HOSPITAL_COMMUNITY): Payer: Medicare (Managed Care)

## 2024-05-14 ENCOUNTER — Ambulatory Visit (HOSPITAL_COMMUNITY)
Admission: RE | Admit: 2024-05-14 | Discharge: 2024-05-14 | Disposition: A | Discharge status: Home / Self Care | Payer: Medicare (Managed Care) | Source: Ambulatory Visit | Attending: Nurse Practitioner | Admitting: Nurse Practitioner

## 2024-05-14 DIAGNOSIS — R188 Other ascites: Secondary | ICD-10-CM

## 2024-05-14 MED ORDER — LIDOCAINE HCL 1 % IJ SOLN
20.0000 mL | Freq: Once | INTRAMUSCULAR | Status: AC
  Administered 2024-05-14: 10 mL

## 2024-05-14 NOTE — Procedures (Signed)
 PROCEDURE SUMMARY:  Successful US  guided paracentesis from left lateral abdomen.  Yielded 4.7 L of clear, yellow fluid.  No immediate complications.  Pt tolerated well.   Specimen was sent for labs.  EBL < 5mL  Solmon Selmer Ku PA-C 05/14/2024 11:12 AM

## 2024-05-20 ENCOUNTER — Other Ambulatory Visit (HOSPITAL_COMMUNITY): Payer: Self-pay | Admitting: Nurse Practitioner

## 2024-05-20 DIAGNOSIS — K7031 Alcoholic cirrhosis of liver with ascites: Secondary | ICD-10-CM

## 2024-05-21 ENCOUNTER — Ambulatory Visit (HOSPITAL_COMMUNITY)
Admission: RE | Admit: 2024-05-21 | Discharge: 2024-05-21 | Disposition: A | Payer: Medicare (Managed Care) | Source: Ambulatory Visit | Attending: Nurse Practitioner | Admitting: Nurse Practitioner

## 2024-05-21 DIAGNOSIS — K7031 Alcoholic cirrhosis of liver with ascites: Secondary | ICD-10-CM | POA: Insufficient documentation

## 2024-05-21 MED ORDER — LIDOCAINE-EPINEPHRINE 1 %-1:100000 IJ SOLN
INTRAMUSCULAR | Status: AC
  Filled 2024-05-21: qty 20

## 2024-05-21 MED ORDER — LIDOCAINE-EPINEPHRINE 1 %-1:100000 IJ SOLN
20.0000 mL | Freq: Once | INTRAMUSCULAR | Status: AC
  Administered 2024-05-21: 20 mL via INTRADERMAL

## 2024-05-21 NOTE — Procedures (Signed)
 PROCEDURE SUMMARY:  Successful image-guided paracentesis from the left lower abdomen.  Yielded 3.9 liters of clear, yellow fluid.  No immediate complications.  EBL: zero Patient tolerated well.   Specimen not sent for labs.  Please see imaging section of Epic for full dictation.  Greig FORBES Jasmine PA-C 05/21/2024 10:20 AM

## 2024-05-25 ENCOUNTER — Encounter: Payer: Self-pay | Admitting: Physician Assistant

## 2024-05-25 ENCOUNTER — Ambulatory Visit: Payer: Medicare (Managed Care) | Admitting: Physician Assistant

## 2024-05-25 VITALS — BP 119/72 | HR 90 | Ht 68.0 in

## 2024-05-25 DIAGNOSIS — Z860101 Personal history of adenomatous and serrated colon polyps: Secondary | ICD-10-CM

## 2024-05-25 DIAGNOSIS — F1420 Cocaine dependence, uncomplicated: Secondary | ICD-10-CM

## 2024-05-25 DIAGNOSIS — K7290 Hepatic failure, unspecified without coma: Secondary | ICD-10-CM

## 2024-05-25 DIAGNOSIS — Z860100 Personal history of colon polyps, unspecified: Secondary | ICD-10-CM

## 2024-05-25 DIAGNOSIS — Z8719 Personal history of other diseases of the digestive system: Secondary | ICD-10-CM

## 2024-05-25 DIAGNOSIS — K7031 Alcoholic cirrhosis of liver with ascites: Secondary | ICD-10-CM

## 2024-05-25 DIAGNOSIS — K766 Portal hypertension: Secondary | ICD-10-CM

## 2024-05-25 DIAGNOSIS — F1490 Cocaine use, unspecified, uncomplicated: Secondary | ICD-10-CM

## 2024-05-25 NOTE — Patient Instructions (Signed)
 Follow-up 4 months. Office will contact you at a later time to schedule.   _______________________________________________________  If your blood pressure at your visit was 140/90 or greater, please contact your primary care physician to follow up on this.  _______________________________________________________  If you are age 68 or older, your body mass index should be between 23-30. Your Body mass index is 31.17 kg/m. If this is out of the aforementioned range listed, please consider follow up with your Primary Care Provider.  If you are age 40 or younger, your body mass index should be between 19-25. Your Body mass index is 31.17 kg/m. If this is out of the aformentioned range listed, please consider follow up with your Primary Care Provider.   ________________________________________________________  The [facility] providers would like to encourage you to use MYCHART to communicate with providers for non-urgent requests or questions.  Due to long hold times on the telephone, sending your provider a message by [facility] may be a faster and more efficient way to get a response.  Please allow 48 business hours for a response.  Please remember that this is for non-urgent requests.  _______________________________________________________  [facility] is using a team-based approach to care.  Your team is made up of your doctor and two to three APPS. Our APPS (Nurse Practitioners and Physician Assistants) work with your physician to ensure care continuity for you. They are fully qualified to address your health concerns and develop a treatment plan. They communicate directly with your gastroenterologist to care for you. Seeing the Advanced Practice Practitioners on your physician's team can help you by facilitating care more promptly, often allowing for earlier appointments, access to diagnostic testing, procedures, and other specialty referrals.   Thank you for choosing me and [facility].  Delon Failing, PA-C

## 2024-05-25 NOTE — Progress Notes (Signed)
 "  Chief Complaint: Decompensated cirrhosis  HPI:    John [name] is a 69 year old African-American male with a past medical history as listed below including CHF, CKD and decompensated alcoholic cirrhosis, known to Dr. Wilhelmenia, who returns to clinic today for follow-up of his decompensated alcoholic cirrhosis.    05/09/2023 ultrasound-guided paracentesis with 5 point liters of hazy, amber fluid removed.    05/10/2023 echo for A-fib with an LVEF of 55-60% with mild left ventricular hypertrophy and aortic dilation.    05/12/2023 ultrasound-guided paracentesis with 5 L of fluid removed.    05/13/2023 patient seen in the hospital by our service with decompensated alcoholic cirrhosis and fluid retention.  Time of that admission patient discussed that he had quit drinking greater than 10 years ago but continued to have a cocaine habit.  He had been out of a diuretic for several months and the swelling had gotten worse to the point where it was painful in his legs and abdomen.  That admission MELD-NA was 20.  Was noted the patient was overdue for a colonoscopy and EGD.  Patient started back on Spironolactone  50 mg daily and Lasix  40 mg daily.    05/14/2023 patient wanted to be discharged.  Discussed that he would need an EGD and colonoscopy outpatient but in the hospital setting.    06/30/2023-07/08/2023 patient admitted to the hospital for acute on chronic diastolic CHF and decompensated cirrhosis with ascites.  He underwent paracentesis that ruled out SBP, 3.5 L removed.  Transition to oral Lasix  and spironolactone .  Creatinine remained stable.    07/07/2023 BMP sodium 133, creatinine 2.12.  CBC with a hemoglobin 8.9 (around baseline).  Patient scheduled for ultrasound-guided paracentesis, refill Lasix  40 mg daily and Spironolactone  50 mg daily.  Recheck CMP in 1 to 2 weeks.  Schedule patient for an EGD and colonoscopy at the hospital with Dr. Wilhelmenia.    02/16/2024 echocardiogram with LVEF 50-55%.    Patient  having recurrent paracentesis with IR, the last on 05/21/2024 with 3.9 l of fluid removed.  Repeat scheduled on 05/28/2024.    Of note patient was set up for TIPS on 05/10/2024 but described illicit drug use prior to procedure and IR rescheduled a month out.  MELD 3.0: 10 at 05/07/2024  9:51 AM MELD-Na: 10 at 05/07/2024  9:51 AM Calculated from: Serum Creatinine: 1.3 mg/dL at 8/0/7973  0:48 AM Serum Sodium: 137 mmol/L at 05/07/2024  9:51 AM Total Bilirubin: 0.9 mg/dL (Using min of 1 mg/dL) at 8/0/7973  0:48 AM Serum Albumin : 3 g/dL at 8/0/7973  0:48 AM INR(ratio): 1.1 at 05/07/2024  9:51 AM Age at listing (hypothetical): 66 years Sex: Male at 05/07/2024  9:51 AM  Discussed the use of AI scribe software for clinical note transcription with the patient, who gave verbal consent to proceed.  History of Present Illness John [name] is a 69 year old male with decompensated alcoholic cirrhosis and recurrent ascites who presents for follow-up of refractory ascites management.  He has been undergoing weekly paracentesis for refractory ascites, with the most recent procedure performed approximately one week ago. Persistent leakage from the paracentesis site has occurred, which is atypical for him; previously, leakage would resolve within a day, but this episode has continued. The site is described as always warm but without pain, including with pressure. Fluid reaccumulates within a week, necessitating repeat procedures.  The TIPS procedure was postponed for one month after the patient reported recent cocaine use.  Endoscopy and colonoscopy for surveillance have  been delayed due to missed appointments and logistical issues.  Spironolactone  is taken intermittently, as he states me and medicine just don't get along and sometimes chooses not to take it. Lactulose  is taken sporadically and he is unsure about the dosing. He typically has one or two bowel movements per day, but is aware the goal is three  daily.   He denies current alcohol use and states he has been abstinent for 2 years.  Denies fever, chills, weight loss or abdominal pain.   GI history: 12/26/2021 office visit with Dr. Aneita for follow-up of decompensated cirrhosis: At that time had been in the ED for visit with lower extremity edema on 7/18, CT showed bilateral flank edema with perirectal edema and small bilateral pleural effusions; plan: At that time diagnosed decompensated cirrhosis secondary to hepatitis C and alcohol portal gastropathy, esophageal varices and bleeding gastric varices treated with BRTO and EGD, also CHF with volume overload, GERD with LA grade a esophagitis and an esophageal stricture and a personal history of tubular adenoma and serrated adenoma in November 2019 11/13/2021 CTAP with cirrhosis and portal hypertension manifesting as splenomegaly and distal esophageal varices, increased bilateral flank edema, stable small umbilical hernia containing small bowel and aortic atherosclerosis 06/27/2020 EGD with grade 1 esophageal varices, benign-appearing esophageal stenosis, LA grade a reflux esophagitis, portal hypertensive gastropathy, gastric varices without bleeding a small hiatal hernia-recommended repeat EGD in 1 to 2 years for surveillance 03/02/2018 colonoscopy with one 5 mm polyp in transverse colon, four 7-8 mm polyps in the sigmoid colon, descending colon and transverse colon, lipoma in the transverse colon internal hemorrhoids-pathology showed tubular adenoma and serrated adenoma-repeat recommended in 3 years  Past Medical History:  Diagnosis Date   Allergy    Arthritis    oa back   Asthma    CAP (community acquired pneumonia) 05/23/2015   Cataract    both eyes   CHF (congestive heart failure) (HCC) 2001   sees primary for   Chronic back pain    Chronic kidney disease    ckd 3   Cirrhosis (HCC)    alcoholic with h/o varices, ascites   Coronary artery disease    nonobstrucrtibe   ETOH abuse     GERD (gastroesophageal reflux disease)    Gout    Hepatitis    hepatitic c 2018 24 week tx with ecuplipsa, no hepatitic c detected after tx   History of blood transfusion 8-9- yrs ago   Hypertension    Optic neuropathy, left    PAD (peripheral artery disease)    slight  primary manages   Polysubstance abuse (HCC)    Primary hypertension    Renal cell carcinoma (HCC) 2016, 2018 and 2022   left ablation   Seizures [facility])    age 76 none since   Sickle cell trait    Stroke (HCC) 1998   light no problems with   Uses walker 12/04/2020   Wears dentures    Wears glasses     Past Surgical History:  Procedure Laterality Date   ARTHRODESIS METATARSALPHALANGEAL JOINT (MTPJ) Right 12/07/2020   Procedure: ARTHRODESIS METATARSALPHALANGEAL JOINT (MTPJ);  Surgeon: Janit Thresa HERO, DPM;  Location: [facility];  Service: Podiatry;  Laterality: Right;   COLONOSCOPY  2021   ESOPHAGOGASTRODUODENOSCOPY N/A 08/14/2014   Procedure: ESOPHAGOGASTRODUODENOSCOPY (EGD);  Surgeon: Gwendlyn ONEIDA Aneita, MD;  Location: [facility];  Service: Endoscopy;  Laterality: N/A;   ESOPHAGOGASTRODUODENOSCOPY (EGD) WITH PROPOFOL  N/A 06/03/2017   Procedure: ESOPHAGOGASTRODUODENOSCOPY (EGD) WITH  PROPOFOL ;  Surgeon: Aneita Gwendlyn DASEN, MD;  Location: [facility];  Service: Endoscopy;  Laterality: N/A;   HALLUX FUSION Left 07/28/2020   Procedure: HALLUX FUSION MPJ;  Surgeon: Janit Thresa HERO, DPM;  Location: [facility] [place];  Service: Podiatry;  Laterality: Left;   HAMMER TOE SURGERY Left 07/28/2020   Procedure: HAMMER TOE CORRECTION  2,3,AND4 LEFT FOOT;  Surgeon: Janit Thresa HERO, DPM;  Location: [facility] [place];  Service: Podiatry;  Laterality: Left;   HAMMER TOE SURGERY Right 12/07/2020   Procedure: HAMMER TOE CORRECTION  2-4;  Surgeon: Janit Thresa HERO, DPM;  Location: [facility] [place];  Service: Podiatry;  Laterality: Right;   IR PARACENTESIS  09/05/2023   IR PARACENTESIS  09/16/2023   IR  PARACENTESIS  12/16/2023   IR PARACENTESIS  12/24/2023   IR PARACENTESIS  12/31/2023   IR PARACENTESIS  01/14/2024   IR PARACENTESIS  01/22/2024   IR PARACENTESIS  02/03/2024   IR PARACENTESIS  02/20/2024   IR PARACENTESIS  02/27/2024   IR PARACENTESIS  02/13/2024   IR PARACENTESIS  03/05/2024   IR PARACENTESIS  03/12/2024   IR PARACENTESIS  03/19/2024   IR PARACENTESIS  04/02/2024   IR PARACENTESIS  04/09/2024   IR PARACENTESIS  04/16/2024   IR PARACENTESIS  04/23/2024   IR PARACENTESIS  04/30/2024   IR PARACENTESIS  05/21/2024   IR PARACENTESIS  05/14/2024   IR RADIOLOGIST EVAL & MGMT  09/25/2016   IR RADIOLOGIST EVAL & MGMT  12/10/2016   IR RADIOLOGIST EVAL & MGMT  03/25/2018   IR RADIOLOGIST EVAL & MGMT  03/24/2019   IR RADIOLOGIST EVAL & MGMT  05/17/2020   IR RADIOLOGIST EVAL & MGMT  08/03/2020   IR RADIOLOGIST EVAL & MGMT  11/22/2020   IR RADIOLOGIST EVAL & MGMT  05/31/2021   IR RADIOLOGIST EVAL & MGMT  02/16/2024   METATARSAL HEAD EXCISION Left 07/28/2020   Procedure: METATARSAL HEAD EXCISION TOES 2,3,AND 4  LEFT FOOT;  Surgeon: Janit Thresa HERO, DPM;  Location: [facility];  Service: Podiatry;  Laterality: Left;   METATARSAL OSTEOTOMY Right 12/07/2020   Procedure: METATARSAL OSTEOTOMY TOES 2-4 RIGHT FOOT;  Surgeon: Janit Thresa HERO, DPM;  Location: [facility];  Service: Podiatry;  Laterality: Right;   none     RADIOFREQUENCY ABLATION Left 11/08/2016   Procedure: LEFT RENAL CRYOABLATION;  Surgeon: Vanice Sharper, MD;  Location: [facility];  Service: Anesthesiology;  Laterality: Left;   RADIOLOGY WITH ANESTHESIA N/A 08/15/2014   Procedure: RADIOLOGY WITH ANESTHESIA;  Surgeon: Sharper Vanice, MD;  Location[facility];  Service: Radiology;  Laterality: N/A;   RADIOLOGY WITH ANESTHESIA Left 07/05/2020   Procedure: CT CRYOABLATION;  Surgeon: Vanice Sharper, MD;  Location: [facility];  Service: Anesthesiology;  Laterality: Left;   UPPER GASTROINTESTINAL ENDOSCOPY      Current  Outpatient Medications  Medication Sig Dispense Refill   acetaminophen  (TYLENOL ) 325 MG tablet Take 650 mg by mouth every 8 (eight) hours as needed for moderate pain (pain score 4-6) (Do not exceed 6 tabs per day).     albuterol  (PROAIR  HFA) 108 (90 Base) MCG/ACT inhaler Inhale 2 puffs into the lungs every 6 (six) hours as needed for wheezing or shortness of breath. 6.7 g 4   allopurinol  (ZYLOPRIM ) 100 MG tablet Take 0.5 tablets (50 mg total) by mouth daily. 45 tablet 0   amLODipine  (NORVASC ) 10 MG tablet Take 10 mg by mouth daily. Not taking (Patient not taking:  Reported on 05/05/2024)     atorvastatin  (LIPITOR) 40 MG tablet Take 1 tablet (40 mg total) by mouth at bedtime. 90 tablet 0   beclomethasone (QVAR  REDIHALER) 80 MCG/ACT inhaler Inhale 1 puff into the lungs 2 (two) times daily. (Patient not taking: Reported on 05/05/2024) 10.6 g 12   brimonidine  (ALPHAGAN ) 0.15 % ophthalmic solution Place 1 drop into both eyes 2 (two) times daily.     brimonidine  (ALPHAGAN ) 0.2 % ophthalmic solution Place 1 drop into both eyes 2 (two) times daily. (Patient not taking: Reported on 05/06/2024) 5 mL 12   cetirizine (ZYRTEC) 10 MG chewable tablet Chew 10 mg by mouth daily. (Patient not taking: Reported on 05/05/2024)     Cholecalciferol (VITAMIN D3) 125 MCG (5000 UT) CAPS Take 1 capsule by mouth 2 (two) times daily.     ciprofloxacin  (CIPRO ) 500 MG tablet Take 500 mg by mouth 2 (two) times daily.     clobetasol cream (TEMOVATE) 0.05 % Apply 1 Application topically 2 (two) times daily.     FEROSUL 325 (65 Fe) MG tablet Take 1 tablet (325 mg total) by mouth every Monday, Wednesday, and Friday. 36 tablet 0   fluticasone  (FLONASE ) 50 MCG/ACT nasal spray Place 1 spray into both nostrils daily as needed for allergies or rhinitis.     fluticasone  (FLOVENT  HFA) 220 MCG/ACT inhaler Inhale 2 puffs into the lungs 2 (two) times daily.     folic acid  (FOLVITE ) 1 MG tablet Take 1 mg by mouth daily.     furosemide  (LASIX ) 40 MG  tablet Take 2 tablets (80 mg total) by mouth daily for 5 days. (Patient taking differently: Take 40 mg by mouth daily.) 90 tablet 2   hydrALAZINE  (APRESOLINE ) 10 MG tablet Take 10 mg by mouth 3 (three) times daily. Not taking (Patient not taking: Reported on 05/05/2024)     HYDROcodone -acetaminophen  (NORCO/VICODIN) 5-325 MG tablet Take 1 tablet by mouth every 6 (six) hours as needed for moderate pain (pain score 4-6). (Patient not taking: Reported on 05/05/2024)     ketoconazole  (NIZORAL ) 2 % shampoo Apply topically to shampoo the scalp 2 times a week (Patient not taking: Reported on 05/06/2024) 120 mL 0   lactulose  (CHRONULAC ) 10 GM/15ML solution Take 15 mLs (10 g total) by mouth 2 (two) times daily as needed for mild constipation. (Patient not taking: Reported on 05/05/2024) 236 mL 0   melatonin 5 MG TABS Take 5 mg by mouth at bedtime.     meloxicam  (MOBIC ) 15 MG tablet Take 15 mg by mouth daily. (Patient not taking: Reported on 05/05/2024)     Menthol , Topical Analgesic, (BIOFREEZE COOL THE PAIN) 4 % GEL Apply 1 Application topically See admin instructions. 1-2 times daily as needed for pain; lumbar region     Menthol -Methyl Salicylate  (SALONPAS  PAIN RELIEF  PATCH) PTCH Apply 1 patch topically daily as needed (for mid-back pain and lower back pain). (Patient taking differently: Apply 1 patch topically daily.)     metoprolol  succinate (TOPROL  XL) 25 MG 24 hr tablet Take 1 tablet (25 mg total) by mouth at bedtime. (Patient taking differently: Take 12.5 mg by mouth daily.) 90 tablet 0   mupirocin  cream (BACTROBAN ) 2 % Apply 1 Application topically 2 (two) times daily. (Patient not taking: Reported on 05/06/2024) 15 g 0   omeprazole  (PRILOSEC) 40 MG capsule Take 1 capsule (40 mg total) by mouth daily before breakfast. 90 capsule 0   OVER THE COUNTER MEDICATION Apply 1 Application topically daily as needed (dry  skin). Bag Balm Ointment     oxyCODONE  (ROXICODONE ) 5 MG immediate release tablet Take 0.5 tablets (2.5 mg  total) by mouth every 4 (four) hours as needed for severe pain (pain score 7-10) or breakthrough pain. (Patient not taking: Reported on 05/05/2024) 8 tablet 0   potassium chloride  (KLOR-CON ) 10 MEQ tablet Take 1 tablet (10 mEq total) by mouth daily. 90 tablet 0   spironolactone  (ALDACTONE ) 50 MG tablet Take 0.5 tablets (25 mg total) by mouth daily for 60 doses. (Patient taking differently: Take 50 mg by mouth daily.) 30 tablet 0   thiamine  (VITAMIN B-1) 100 MG tablet Take 100 mg by mouth daily.     No current facility-administered medications for this visit.    Allergies as of 05/25/2024 - Reviewed 05/14/2024  Allergen Reaction Noted   Penicillins Other (See Comments) 12/16/2012    Family History  Problem Relation Age of Onset   Hypertension Mother        Living   Kidney disease Mother    Diabetes Mother    Heart disease Mother    Hypertension Father        Deceased, 58   Ulcers Father    Stomach cancer Father    Hypertension Brother    Diabetes Brother    Kidney disease Brother    Hypertension Sister    Colon cancer Neg Hx    Esophageal cancer Neg Hx    Rectal cancer Neg Hx     Social History   Socioeconomic History   Marital status: Single    Spouse name: Not on file   Number of children: 0   Years of education: Not on file   Highest education level: Not on file  Occupational History   Occupation: retired  Tobacco Use   Smoking status: Some Days    Current packs/day: 0.00    Average packs/day: 0.3 packs/day for 20.0 years (6.0 ttl pk-yrs)    Types: Cigarettes    Start date: 04/30/1991    Last attempt to quit: 04/30/2011    Years since quitting: 13.0   Smokeless tobacco: Never   Tobacco comments:    2015  Vaping Use   Vaping status: Never Used  Substance and Sexual Activity   Alcohol use: No    Comment: not in 11 years   Drug use: Yes    Types: Cocaine, Marijuana    Comment: Last used cocaine and marijuana within the past week1/2-26-1/8-26   Sexual activity:  Not Currently  Other Topics Concern   Not on file  Social History Narrative   Lives with niece in a 2 story home.     On disability since 2015 for low back pain.  Used to work environmental manager.    Highest level of education: 11th grade   Social Drivers of Health   Tobacco Use: High Risk (05/07/2024)   Patient History    Smoking Tobacco Use: Some Days    Smokeless Tobacco Use: Never    Passive Exposure: Not on file  Financial Resource Strain: Not on file  Food Insecurity: Food Insecurity Present (12/11/2023)   Epic    Worried About Programme Researcher, Broadcasting/film/video in the Last Year: Sometimes true    [employer] of Food in the Last Year: Never true  Transportation Needs: No Transportation Needs (12/11/2023)   Epic    Lack of Transportation (Medical): No    Lack of Transportation (Non-Medical): No  Physical Activity: Not on file  Stress: Not on file  Social Connections: Moderately Integrated (12/10/2023)   Social Connection and Isolation Panel    Frequency of Communication with Friends and Family: Twice a week    Frequency of Social Gatherings with Friends and Family: Once a week    Attends Religious Services: 1 to 4 times per year    Active Member of [employer] or Organizations: No    Attends Banker Meetings: 1 to 4 times per year    Marital Status: Never married  Intimate Partner Violence: Not At Risk (12/11/2023)   Epic    Fear of Current or Ex-Partner: No    Emotionally Abused: No    Physically Abused: No    Sexually Abused: No  Depression (PHQ2-9): Not on file  Alcohol Screen: Not on file  Housing: Low Risk (12/11/2023)   Epic    Unable to Pay for Housing in the Last Year: No    Number of Times Moved in the Last Year: 0    Homeless in the Last Year: No  Utilities: Not At Risk (12/11/2023)   Epic    Threatened with loss of utilities: No  Health Literacy: Not on file    Review of Systems:    Constitutional: No weight loss, fever or chills Skin: No rash  Cardiovascular: No  chest pain  Respiratory: No SOB  Gastrointestinal: See HPI and otherwise negative Genitourinary: No dysuria  Neurological: No headache, dizziness or syncope Musculoskeletal: No new muscle or joint pain Hematologic: No bleeding  Psychiatric: No history of depression or anxiety   Physical Exam:  Vital signs: BP 119/72   Pulse 90   Ht 5' 8 (1.727 m)   BMI 31.17 kg/m    Constitutional:   Pleasant overweight AA male appears to be in NAD, Well developed, Well nourished, alert and cooperative Head:  Normocephalic and atraumatic. Eyes:   PEERL, EOMI. No icterus. Conjunctiva pink. Ears:  Normal auditory acuity. Neck:  Supple Throat: Oral cavity and pharynx without inflammation, swelling or lesion.  Respiratory: Respirations even and unlabored. Lungs clear to auscultation bilaterally.   No wheezes, crackles, or rhonchi.  Cardiovascular: Normal S1, S2. No MRG. Regular rate and rhythm. No peripheral edema, cyanosis or pallor.  Gastrointestinal:  Soft, nondistended, nontender. No rebound or guarding. Normal bowel sounds. No appreciable masses or hepatomegaly.+small amount of leakage of clear liquid from recent paracentesis site +small amount of anasarca Rectal:  Not performed.  Msk:  Symmetrical without gross deformities. Without edema, no deformity or joint abnormality.  Neurologic:  Alert and  oriented x4;  grossly normal neurologically.  Skin:   Dry and intact without significant lesions or rashes. Psychiatric: Demonstrates good judgement and reason without abnormal affect or behaviors.  RELEVANT LABS AND IMAGING: CBC    Component Value Date/Time   WBC 4.8 05/07/2024 0951   RBC 4.31 05/07/2024 0951   HGB 10.8 (L) 05/07/2024 0951   HGB 9.4 (L) 05/23/2023 1018   HCT 34.0 (L) 05/07/2024 0951   HCT 32.5 (L) 05/23/2023 1018   PLT 129 (L) 05/07/2024 0951   PLT 124 (L) 05/23/2023 1018   MCV 78.9 (L) 05/07/2024 0951   MCV 83 05/23/2023 1018   MCH 25.1 (L) 05/07/2024 0951   MCHC 31.8  05/07/2024 0951   RDW 17.5 (H) 05/07/2024 0951   RDW 16.7 (H) 05/23/2023 1018   LYMPHSABS 0.5 (L) 03/29/2024 0556   MONOABS 0.7 03/29/2024 0556   EOSABS 0.2 03/29/2024 0556   BASOSABS 0.0 03/29/2024 0556    CMP  Component Value Date/Time   NA 137 05/07/2024 0951   NA 140 05/23/2023 1022   K 4.3 05/07/2024 0951   CL 107 05/07/2024 0951   CO2 21 (L) 05/07/2024 0951   GLUCOSE 82 05/07/2024 0951   BUN 20 05/07/2024 0951   BUN 24 05/23/2023 1022   CREATININE 1.30 (H) 05/07/2024 0951   CREATININE 1.79 (H) 12/09/2016 1446   CALCIUM  8.5 (L) 05/07/2024 0951   PROT 6.8 05/07/2024 0951   PROT 7.3 05/23/2023 1018   ALBUMIN  3.0 (L) 05/07/2024 0951   ALBUMIN  4.0 05/23/2023 1018   AST 27 05/07/2024 0951   ALT 9 05/07/2024 0951   ALKPHOS 69 05/07/2024 0951   BILITOT 0.9 05/07/2024 0951   BILITOT 0.8 05/23/2023 1018   GFRNONAA >60 05/07/2024 0951   GFRNONAA 40 (L) 12/09/2016 1446   GFRAA >60 05/06/2018 0601   GFRAA 47 (L) 12/09/2016 1446     Assessment & Plan Decompensated alcoholic cirrhosis with refractory ascites Chronic, severe, and refractory cirrhosis requiring weekly paracentesis, with persistent leakage from the paracentesis site and inconsistent adherence to diuretics and lactulose , increasing risk for fluid overload and hepatic encephalopathy. - Reinforced regular use of spironolactone  to manage fluid overload and reduce ascites. - Reinforced regular use of lactulose  to prevent hepatic encephalopathy, advising at least three bowel movements per day. - Provided education regarding the purpose and benefits of spironolactone  and lactulose . - Advised continued pressure and bandaging at the paracentesis site to manage leakage. -MELD 3.0: 10 at 05/07/2024  9:51 AM MELD-Na: 10 at 05/07/2024  9:51 AM Calculated from: Serum Creatinine: 1.3 mg/dL at 8/0/7973  0:48 AM Serum Sodium: 137 mmol/L at 05/07/2024  9:51 AM Total Bilirubin: 0.9 mg/dL (Using min of 1 mg/dL) at 8/0/7973  0:48  AM Serum Albumin : 3 g/dL at 8/0/7973  0:48 AM INR(ratio): 1.1 at 05/07/2024  9:51 AM Age at listing (hypothetical): 56 years Sex: Male at 05/07/2024  9:51 AM    Portal hypertension with planned TIPS procedure Severe portal hypertension with refractory ascites; TIPS procedure postponed due to recent cocaine use. Abstinence may allow for significant symptom improvement and reduced paracentesis frequency. - Encouraged strict abstinence from cocaine to enable safe scheduling of the TIPS procedure. - Discussed that successful TIPS placement would reduce/eliminate the need for frequent paracentesis and improve quality of life. - Planned follow-up with interventional radiology for TIPS evaluation after a period of abstinence.  History of adenomatous polyps with planned endoscopy and colonoscopy Adenomatous polyps with overdue surveillance endoscopy and colonoscopy, increasing risk for colorectal cancer and variceal bleeding if not performed. - Will send a note to Dr. Wilhelmenia.  This patient has no-showed multiple scheduled appointments previously.  He is due for an EGD for surveillance of varices as well as a surveillance colonoscopy.  Will discuss with Mansouraty about timing. - Patient will be called when these are scheduled  Cocaine dependence Ongoing cocaine dependence with recent use, delaying necessary interventions for portal hypertension and increasing procedural risk; he expressed understanding and motivation to abstain. - Provided counseling on the importance of cocaine abstinence, particularly regarding TIPS eligibility and overall health. - Encouraged abstinence  Patient to follow in clinic with Dr. Wilhelmenia in 3 to 4 months.  This will be scheduled when schedule becomes available.   Delon Failing, PA-C [facility] 05/25/2024, 11:48 AM  Cc: Cloria Annabella CROME, DO  "

## 2024-05-26 NOTE — Progress Notes (Signed)
"   Attending Physician's Attestation   I have reviewed the chart.   I agree with the Advanced Practitioner's note, impression, and recommendations with any updates as below.  Review of his chart is such that he has not been the greatest in regards to GI follow-up.  With that being said, seems like he has had significant issues with being able to maintain his ascites control without LVP's on a periodic basis.  Was actually going to have a TIPS procedure earlier this month but canceled as appointment of recent drug use.  In any case I think that we need to be very mindful and our IR colleagues certainly are, that after TIPS is performed, as he has not established with a hepatology team, that if any decompensation occurs, he may have more significant issues develop.  Him being diuretic unresponsive versus being unable to have medication adherence is important.  We have not seen him in quite a few months and has now reestablished with us .  I am happy for him to be scheduled for an EGD/colonoscopy at this time with a hospital-based needs for potential role of repeat banding.  If he is scheduled for EGD/colonoscopy and does not make it, he will be dismissed from our practice.  He will need updated laboratories including hepatitis A total antibody to evaluate whether he needs immunization or not.  He will need hepatitis B core total antibody and hepatitis B surface antibody assessment and should have those laboratories performed to evaluate for potential needs of immunization.  He needs updated AFP levels as they have not been checked in years.  He needs updated iron labs in the setting of his microcytosis.   If he is seen in IR clinic and still felt to be a candidate for possible TIPS, our team would like to be a aware of this and I suspect that he may need some updated imaging and updated echocardiogram to ensure he still meets criteria from an IR standpoint.  I do think it would be best for the patient to also  establish with a hepatologist and so I recommend that this patient be referred to [facility] or [facility] in [place], so I think that should be pursued as well.   Aloha Finner, MD [facility] Advanced Endoscopy Office # [phone]  "

## 2024-05-27 ENCOUNTER — Telehealth: Payer: Self-pay

## 2024-05-27 DIAGNOSIS — K7460 Unspecified cirrhosis of liver: Secondary | ICD-10-CM

## 2024-05-27 NOTE — Telephone Encounter (Signed)
 Lab order has been entered  Referral to [facility] has been made  Needs endo colon [facility] with [facility]   Left message on machine to call back

## 2024-05-27 NOTE — Telephone Encounter (Signed)
 Author: Mansouraty, Aloha Raddle., MD Author Type: Physician Filed: 05/26/2024  5:01 AM  Note Status: Signed Cosign: Cosign Not Required Encounter Date: 05/25/2024  Editor: Wilhelmenia, Aloha Raddle., MD (Physician)               Attending Physician's Attestation    I have reviewed the chart.    I agree with the Advanced Practitioner's note, impression, and recommendations with any updates as below.   Review of his chart is such that he has not been the greatest in regards to GI follow-up.  With that being said, seems like he has had significant issues with being able to maintain his ascites control without LVP's on a periodic basis.  Was actually going to have a TIPS procedure earlier this month but canceled as appointment of recent drug use.  In any case I think that we need to be very mindful and our IR colleagues certainly are, that after TIPS is performed, as he has not established with a hepatology team, that if any decompensation occurs, he may have more significant issues develop.  Him being diuretic unresponsive versus being unable to have medication adherence is important.  We have not seen him in quite a few months and has now reestablished with us .  I am happy for him to be scheduled for an EGD/colonoscopy at this time with a hospital-based needs for potential role of repeat banding.  If he is scheduled for EGD/colonoscopy and does not make it, he will be dismissed from our practice.  He will need updated laboratories including hepatitis A total antibody to evaluate whether he needs immunization or not.  He will need hepatitis B core total antibody and hepatitis B surface antibody assessment and should have those laboratories performed to evaluate for potential needs of immunization.  He needs updated AFP levels as they have not been checked in years.  He needs updated iron labs in the setting of his microcytosis.   If he is seen in IR clinic and still felt to be a candidate for possible TIPS, our  team would like to be a aware of this and I suspect that he may need some updated imaging and updated echocardiogram to ensure he still meets criteria from an IR standpoint.  I do think it would be best for the patient to also establish with a hepatologist and so I recommend that this patient be referred to [facility] or [facility] in [place], so I think that should be pursued as well.     Aloha Wilhelmenia, MD [facility] Advanced [facility]oscopy Office # [phone]

## 2024-05-28 ENCOUNTER — Ambulatory Visit (HOSPITAL_COMMUNITY)
Admission: RE | Admit: 2024-05-28 | Discharge: 2024-05-28 | Disposition: A | Payer: Medicare (Managed Care) | Source: Ambulatory Visit | Attending: Nurse Practitioner | Admitting: Nurse Practitioner

## 2024-05-28 DIAGNOSIS — K7031 Alcoholic cirrhosis of liver with ascites: Secondary | ICD-10-CM | POA: Diagnosis present

## 2024-05-28 MED ORDER — LIDOCAINE-EPINEPHRINE 1 %-1:100000 IJ SOLN
INTRAMUSCULAR | Status: AC
  Filled 2024-05-28: qty 20

## 2024-05-28 MED ORDER — LIDOCAINE-EPINEPHRINE 1 %-1:100000 IJ SOLN
20.0000 mL | Freq: Once | INTRAMUSCULAR | Status: AC
  Administered 2024-05-28: 20 mL

## 2024-05-28 NOTE — Progress Notes (Signed)
 PROCEDURE SUMMARY:  Successful image-guided paracentesis from the left lateral abdomen.  Yielded 6.9 liters of yellow fluid.  No immediate complications.  EBL: zero Patient tolerated well.   Specimen not sent for labs.  Please see imaging section of Epic for full dictation.  Greig FORBES Jasmine PA-C 05/28/2024 2:11 PM

## 2024-05-28 NOTE — Telephone Encounter (Signed)
 Left message on machine to call back

## 2024-06-04 ENCOUNTER — Ambulatory Visit (HOSPITAL_COMMUNITY): Admission: RE | Admit: 2024-06-04 | Payer: Medicare (Managed Care) | Source: Ambulatory Visit

## 2024-06-04 DIAGNOSIS — K7031 Alcoholic cirrhosis of liver with ascites: Secondary | ICD-10-CM

## 2024-06-04 MED ORDER — LIDOCAINE-EPINEPHRINE 1 %-1:100000 IJ SOLN
INTRAMUSCULAR | Status: AC
  Filled 2024-06-04: qty 20

## 2024-06-04 MED ORDER — LIDOCAINE-EPINEPHRINE 1 %-1:100000 IJ SOLN
20.0000 mL | Freq: Once | INTRAMUSCULAR | Status: AC
  Administered 2024-06-04: 10 mL via INTRADERMAL

## 2024-06-04 NOTE — Procedures (Signed)
 PROCEDURE SUMMARY:  Successful US  guided paracentesis from RLQ lateral abdomen.  Yielded 6.1 liters of straw colored fluid.  No immediate complications.  Patient tolerated well.  EBL = trace  Specimen not sent for labs.  Leanor JINNY Forget PA-C 06/04/2024 2:35 PM

## 2024-06-04 NOTE — Telephone Encounter (Signed)
 Left message on machine to call back

## 2024-06-11 ENCOUNTER — Other Ambulatory Visit (HOSPITAL_COMMUNITY): Payer: Medicare (Managed Care)

## 2024-06-28 ENCOUNTER — Ambulatory Visit (HOSPITAL_BASED_OUTPATIENT_CLINIC_OR_DEPARTMENT_OTHER): Payer: Medicare (Managed Care) | Admitting: Cardiovascular Disease
